# Patient Record
Sex: Male | Born: 1940 | Race: White | Hispanic: No | Marital: Married | Smoking: Former smoker
Health system: Southern US, Community
[De-identification: ages and names within clinical notes are randomized; demographics above are authoritative.]

## PROBLEM LIST (undated history)

## (undated) DIAGNOSIS — Z950 Presence of cardiac pacemaker: Secondary | ICD-10-CM

## (undated) DIAGNOSIS — M1990 Unspecified osteoarthritis, unspecified site: Secondary | ICD-10-CM

## (undated) DIAGNOSIS — R0600 Dyspnea, unspecified: Secondary | ICD-10-CM

## (undated) DIAGNOSIS — I495 Sick sinus syndrome: Secondary | ICD-10-CM

## (undated) DIAGNOSIS — J449 Chronic obstructive pulmonary disease, unspecified: Secondary | ICD-10-CM

## (undated) DIAGNOSIS — I2510 Atherosclerotic heart disease of native coronary artery without angina pectoris: Secondary | ICD-10-CM

## (undated) DIAGNOSIS — Z8719 Personal history of other diseases of the digestive system: Secondary | ICD-10-CM

## (undated) DIAGNOSIS — IMO0001 Reserved for inherently not codable concepts without codable children: Secondary | ICD-10-CM

## (undated) DIAGNOSIS — J349 Unspecified disorder of nose and nasal sinuses: Secondary | ICD-10-CM

## (undated) DIAGNOSIS — F419 Anxiety disorder, unspecified: Secondary | ICD-10-CM

## (undated) DIAGNOSIS — C61 Malignant neoplasm of prostate: Secondary | ICD-10-CM

## (undated) DIAGNOSIS — K311 Adult hypertrophic pyloric stenosis: Secondary | ICD-10-CM

## (undated) DIAGNOSIS — IMO0002 Reserved for concepts with insufficient information to code with codable children: Secondary | ICD-10-CM

## (undated) DIAGNOSIS — I10 Essential (primary) hypertension: Secondary | ICD-10-CM

## (undated) DIAGNOSIS — K219 Gastro-esophageal reflux disease without esophagitis: Secondary | ICD-10-CM

## (undated) HISTORY — PX: FRACTURE SURGERY: SHX138

## (undated) HISTORY — PX: WRIST FRACTURE SURGERY: SHX121

---

## 2013-12-27 LAB — LIPID PANEL
CHOLESTEROL: 184 mg/dL (ref 0–200)
HDL: 51 mg/dL (ref 35–70)
TRIGLYCERIDES: 78 mg/dL (ref 40–160)

## 2013-12-27 LAB — BASIC METABOLIC PANEL
BUN: 11 mg/dL (ref 4–21)
Creatinine: 0.8 mg/dL (ref 0.6–1.3)
Glucose: 100 mg/dL
Potassium: 4.2 mmol/L (ref 3.4–5.3)
Sodium: 140 mmol/L (ref 137–147)

## 2014-02-08 DIAGNOSIS — C61 Malignant neoplasm of prostate: Secondary | ICD-10-CM

## 2014-02-08 HISTORY — DX: Malignant neoplasm of prostate: C61

## 2014-02-08 HISTORY — PX: PROSTATE BIOPSY: SHX241

## 2014-02-16 ENCOUNTER — Other Ambulatory Visit (HOSPITAL_COMMUNITY): Payer: Self-pay | Admitting: Urology

## 2014-02-16 ENCOUNTER — Telehealth: Payer: Self-pay | Admitting: *Deleted

## 2014-02-16 DIAGNOSIS — C61 Malignant neoplasm of prostate: Secondary | ICD-10-CM

## 2014-02-16 NOTE — Telephone Encounter (Signed)
Called patient to confirm his referral to the 02/23/14 Prostate MDC, introduce myself, discuss clinic.  LVM, will call, again, on Monday.  Gayleen Orem, RN, BSN, [facility] Prostate Oncology Navigator [phone]

## 2014-02-19 ENCOUNTER — Telehealth: Payer: Self-pay | Admitting: *Deleted

## 2014-02-19 NOTE — Telephone Encounter (Signed)
In follow-up to my VM last Friday, called patient to introduce myself as Prostate Oncology Navigator and coordinator of the Prostate [place], to confirm his referral for the clinic on 02/23/14, location of [place], arrival time of 8:15, registration procedure, and format of clinic.  He verbalized understanding.  I provided my phone number and encouraged him to call me if he has any questions after receiving the Information Packet or prior to my call the day before clinic.  He verbalized understanding and expressed appreciation for my call.  Gayleen Orem, RN, BSN, [facility] Prostate Oncology Navigator [phone]

## 2014-02-20 ENCOUNTER — Encounter: Payer: Self-pay | Admitting: Radiation Oncology

## 2014-02-20 NOTE — Progress Notes (Signed)
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 no  Pacemaker/ICD? no  Possible current pregnancy? na  Is the patient on methotrexate? no  Current Complaints / other details:  Married, 3 children, retired

## 2014-02-22 ENCOUNTER — Ambulatory Visit (HOSPITAL_COMMUNITY)
Admission: RE | Admit: 2014-02-22 | Discharge: 2014-02-22 | Disposition: A | Discharge status: Home / Self Care | Payer: Medicare HMO | Source: Ambulatory Visit | Attending: Urology | Admitting: Urology

## 2014-02-22 ENCOUNTER — Encounter (HOSPITAL_COMMUNITY)
Admission: RE | Admit: 2014-02-22 | Discharge: 2014-02-22 | Disposition: A | Discharge status: Home / Self Care | Payer: Medicare HMO | Source: Ambulatory Visit | Attending: Urology | Admitting: Urology

## 2014-02-22 ENCOUNTER — Telehealth: Payer: Self-pay | Admitting: Oncology

## 2014-02-22 ENCOUNTER — Encounter: Payer: Self-pay | Admitting: *Deleted

## 2014-02-22 ENCOUNTER — Other Ambulatory Visit: Payer: Self-pay | Admitting: Urology

## 2014-02-22 DIAGNOSIS — R52 Pain, unspecified: Secondary | ICD-10-CM

## 2014-02-22 DIAGNOSIS — M4120 Other idiopathic scoliosis, site unspecified: Secondary | ICD-10-CM | POA: Insufficient documentation

## 2014-02-22 DIAGNOSIS — C61 Malignant neoplasm of prostate: Secondary | ICD-10-CM | POA: Insufficient documentation

## 2014-02-22 DIAGNOSIS — R948 Abnormal results of function studies of other organs and systems: Secondary | ICD-10-CM | POA: Insufficient documentation

## 2014-02-22 MED ORDER — TECHNETIUM TC 99M MEDRONATE IV KIT
26.9000 | PACK | Freq: Once | INTRAVENOUS | Status: AC | PRN
  Administered 2014-02-22: 26.9 via INTRAVENOUS

## 2014-02-22 NOTE — Telephone Encounter (Signed)
C/D 02/22/14 for appt. 02/23/14

## 2014-02-23 ENCOUNTER — Encounter: Payer: Self-pay | Admitting: Radiation Oncology

## 2014-02-23 ENCOUNTER — Encounter: Payer: Self-pay | Admitting: Specialist

## 2014-02-23 ENCOUNTER — Ambulatory Visit
Admission: RE | Admit: 2014-02-23 | Discharge: 2014-02-23 | Disposition: A | Discharge status: Home / Self Care | Payer: Medicare HMO | Source: Ambulatory Visit | Attending: Radiation Oncology | Admitting: Radiation Oncology

## 2014-02-23 ENCOUNTER — Ambulatory Visit (HOSPITAL_BASED_OUTPATIENT_CLINIC_OR_DEPARTMENT_OTHER): Payer: Commercial Managed Care - HMO | Admitting: Oncology

## 2014-02-23 ENCOUNTER — Encounter: Payer: Self-pay | Admitting: Oncology

## 2014-02-23 VITALS — BP 160/116 | HR 79 | Temp 98.0°F | Resp 20 | Ht 68.75 in | Wt 195.4 lb

## 2014-02-23 DIAGNOSIS — C61 Malignant neoplasm of prostate: Secondary | ICD-10-CM

## 2014-02-23 HISTORY — DX: Essential (primary) hypertension: I10

## 2014-02-23 HISTORY — DX: Unspecified disorder of nose and nasal sinuses: J34.9

## 2014-02-23 HISTORY — DX: Malignant neoplasm of prostate: C61

## 2014-02-23 NOTE — Consult Note (Signed)
Chief Complaint  Prostate Cancer   Reason For Visit  Reason for consult: To discuss treatment options for prostate cancer. Physician requesting consult: Dr. Rana Snare PCP: Dr. Christella Noa   History of Present Illness  Tony [name] is a 73 year old, very healthy gentleman who has a strong family history of prostate cancer with both his father and brother having been diagnosed.  He was found to have an elevated PSA of 13.9 in April 2015 by Dr. Doyle Askew and was seen by Dr. Risa Grill for this reason.  His PSA was repeated and was 15.65.  His DRE was concerning with induration noted along the entire right side of the prostate although with no clear extraprostatic extension noted. He underwent a prostate needle biopsy by Dr. 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   He underwent staging studies on 02/22/14 including a bone scan and CT scan. His bone scan demonstrated some uptake in what was initially read as the right clavicle although on independent review and conference today appears to be at the level of the right scapula.  A prior chest x-ray does demonstrate a lucent lesion in this region that correlates to the area of uptake.  Mr. Mcpartland does describe a prior fall from a ladder where he injured his right shoulder area.  Furthermore, there was mention of some mild uptake in the left ischium.  A review of the CT scan, there is no pelvic lymphadenopathy or other evidence of metastatic disease.  At the left ischium, there is a very small sclerotic focus which does not necessarily correlate to the same location on bone scan and is more likely to represent a bone island than metastatic disease.  No correlating lesion is noted to explain the bone scan findings.  No other evidence of metastatic disease on either the bone scan or CT scan is present.  An MRI of the right scapula has been recommended to further evaluate  the lesion seen there.    TNM stage: cT3a N0 Mx PSA: 15.65 Gleason score: 4+5=9 Biopsy (02/08/14): 12/12 cores positive    Left: L lateral apex (80%, 3+4=7). L apex (30%, 4+5=9), L lateral mid (20%, 3+3=6), L mid (70%, 4+4=8, PNI), L lateral base (89%, 3+3=6), L base (50%, 4+3=7, PNI)    Right: R apex (95%, 4+4=8, PNI, EPE), R lateral apex (60%, 4+5=9, PNI), R mid (90%, 4+4=8), R lateral mid (95%, 4+4=8, PNI, EPE), R base (95%, 4+5=9), R lateral base (90%, 4+5=9) Prostate volume: 54 cc  Nomogram OC disease: 3% EPE: 96% SVI: 61% LNI: 63% PFS (surgery): 8%, 4%   Urinary function: He has minimal baseline voiding symptoms.  IPSS is 1. Erectile function: SHIM score is 14.   Past Medical History  1. History of hypertension (V12.59)  Surgical History  1. Denied: History Of Prior Surgery  Allergies  1. Penicillins  Family History  1. Family history of Alzheimer's dementia : Mother, Father  2. Family history of arthritis (V17.7) : Sister  3. Family history of prostate cancer (L24.40) : Brother  Social History   Alcohol use (V49.89)   Current every day smoker (305.1)   Married   Retired  Review of Systems AU Complete-Male: Genitourinary, constitutional, skin, eye, otolaryngeal, hematologic/lymphatic, cardiovascular, pulmonary, endocrine, musculoskeletal, gastrointestinal, neurological and psychiatric system(s) were reviewed and pertinent findings if present are noted.  ENT: sinus problems.    Physical Exam Constitutional: Well nourished and well  developed . No acute distress.  ENT:. The ears and nose are normal in appearance.  Neck: The appearance of the neck is normal and no neck mass is present.  Pulmonary: No respiratory distress and normal respiratory rhythm and effort.  Cardiovascular:. No peripheral edema.  Skin: Normal skin turgor, no visible rash and no visible skin lesions.  Neuro/Psych:. Mood and affect are appropriate.    Results/Data  I have reviewed his  medical records, PSA results, pathology report, and imaging studies with findings as dictated above.  His pathology report and imaging studies were independently reviewed in the multidisciplinary conference today.     Assessment  1. Prostate cancer (185)  Discussion/Summary  1.  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 Linnemann, his wife, and his daughter today.   The patient was counseled about the natural history of prostate cancer and the standard treatment options that are available for prostate cancer. It was explained to him how his age and life expectancy, clinical stage, Gleason score, and PSA affect his prognosis, the decision to proceed with additional staging studies, as well as how that information influences recommended treatment strategies. We discussed the roles for active surveillance, radiation therapy, surgical therapy, androgen deprivation, as well as ablative therapy options for the treatment of prostate cancer as appropriate to his individual cancer situation. We discussed the risks and benefits of these options with regard to their impact on cancer control and also in terms of potential adverse events, complications, and impact on quiality of life particularly related to urinary, bowel, and sexual function. The patient was encouraged to ask questions throughout the discussion today and all questions were answered to his stated satisfaction. In addition, the patient was provided with and/or directed to appropriate resources and literature for further education about prostate cancer and treatment options.   We discussed the high risk and locally advanced nature of his disease and the need to proceed with an MRI of the scapula to absolutely rule out the risk of metastatic disease.  Assuming that he does not have metastatic disease, he understands the recommendation to proceed with aggressive therapy.  We did discuss options for therapy including  surgical resection of the prostate, radiation therapy, and systemic therapies.  Considering the high volume and high risk nature of his disease which appears to be locally advanced and taking into consideration his life expectancy and age, the consensus recommendation of the multidisciplinary group was to proceed with radiation therapy in combination with long-term (at least 24 months) androgen deprivation therapy.  We discussed the option of a clinical trial as well.  Considering his minimal symptoms, Dr. Tammi Klippel has offered the patient 5 weeks of IMRT followed by a radiation seed implant boost of the prostate.  He may need to undergo a CT arch study considering his prostate size.  I answered numerous questions about treatment options as well as the potential risks and side effects as they relate to his quality of life.  He and his family asked numerous appropriate questions and felt comfortable proceeding with the above plan.  I will plan to contact Dr. Risa Grill to inform them of Mr. Bullin's decision.  Cc: Dr. Rana Snare Dr. Tyler Pita Dr. Zola Button Dr. Christella Noa  A total of 45 minutes were spent in the overall care of the patient today with 40 minutes in direct face to face consultation.    Signatures Electronically signed by : [name], M.D.; Feb 23 2014 12:11PM  EST

## 2014-02-23 NOTE — Progress Notes (Signed)
Met with patient as part of Prostate [facility].  Reintroduced my role as his navigator and encouraged him to call as he proceeds with treatments and appointments at [facility].  Provided the accompanying Care Plan Summary:                                   Care Plan Summary  Name: [name]. Crago DOB: [birth year]  Your Medical Team:   Urologist -  Dr. [name], [facility]  Radiation Oncologist - Dr. Tyler Pita, [facility]   Medical Oncologist - Dr. Zola Button, [place]  Recommendations: 1) MRI of right scapula. 2) Start hormone therapy then begin radiation. 3) Seed implant after radiation.  * These recommendations are based on information available as of today's consult. Recommendations may change depending on the results of further tests or exams.  Next Steps: 1) [facility] will contact you to start hormone therapy. 2) [facility] will contact you to arrange MRI. 3) Dr. Johny Shears office will call to arrange an appointment for radiation planning.  When appointments need to be scheduled, you will be contacted by [facility] and/or [facility].  Questions? Please do not hesitate to call Gayleen Orem, RN, BSN, [facility] at [phone] with any questions or concerns.  Tony [name] is Counsellor and is available to assist you while you're receiving your medical care at [facility]. ______________________________________________________________________________________________________________________  I encouraged him to call me with any questions or concerns as his treatments progress.  He indicated understanding.  Gayleen Orem, RN, BSN, [facility] Prostate Oncology Navigator [phone]

## 2014-02-23 NOTE — Progress Notes (Signed)
Called patient to see if he had any questions prior to his attendance at tomorrow's Prostate [place] and to remind him of requested 8:15 arrival time.  LVM.    Gayleen Orem, RN, BSN, [facility] Prostate Oncology Navigator [phone]

## 2014-02-23 NOTE — Progress Notes (Signed)
Please see consult note.  

## 2014-02-23 NOTE — Progress Notes (Signed)
Met patient and spouse in Prostate [place]. Patient said he was a "6" on the distress screen, but his main needs were related to getting information and he felt the clinic was helping with this and reducing his anxiety. Offered support. Provided education about support center programs and service, particularly prostate support group.    Epifania Gore, PhD, [place]

## 2014-02-23 NOTE — Progress Notes (Signed)
Radiation Oncology         (336) [phone] ________________________________  Multidisciplinary Prostate Cancer Clinic  Initial Radiation Oncology Consultation  Name: Tony [name] MRN: [id number]  Date: 02/23/2014  DOB: Jan 29, 1941  CC:No primary provider on file.  Tony Bring, MD   REFERRING PHYSICIAN: [name], MD  DIAGNOSIS: 73 y.o. gentleman with stage T2b (DRE) vs. T3a (core biopsies) adenocarcinoma of the prostate with a Gleason's score of 4+5 and a PSA of 15.65  HISTORY OF PRESENT ILLNESS::Tony [name] is a 73 y.o. gentleman who has a strong family history of prostate cancer with both his father and brother having been diagnosed. He was found to have an elevated PSA of 13.9 in April 2015 by Dr. Doyle [name] and was seen by Dr. Risa [name] for this reason. His PSA was repeated and was 15.65. His DRE was concerning with induration noted along the entire right side of the prostate although with no clear extraprostatic extension noted. He underwent a prostate needle biopsy by Dr. Risa [name] on 02/08/14 which demonstrated Gleason 4+5=9 adenocarcinoma of the prostate with 12 out of 12 biopsy cores positive for malignancy.     He underwent staging studies on 02/22/14 including a bone scan and CT scan. His bone scan demonstrated some uptake in what was initially read as the right clavicle although on independent review and conference today appears to be at the level of the right scapula.     A shoulder x-ray does demonstrate a lucent lesion in this region that correlates to the area of uptake.     Tony [name] does describe a prior fall from a ladder where he injured his right shoulder area. Furthermore, there was mention of some mild uptake in the left ischium. A review of the CT scan, there is no pelvic lymphadenopathy or other evidence of metastatic disease. At the left ischium, there is a very small sclerotic focus which does not necessarily correlate to the same location on bone scan and  is more likely to represent a bone island than metastatic disease. No correlating lesion is noted to explain the bone scan findings. No other evidence of metastatic disease on either the bone scan or CT scan is present. An MRI of the right scapula has been recommended to further evaluate the lesion seen there  The patient reviewed the biopsy results with his urologist and he has kindly been referred today to the multidisciplinary prostate cancer clinic for presentation of pathology and radiology studies in our conference for discussion of potential radiation treatment options and clinical evaluation.  PREVIOUS RADIATION THERAPY: No  PAST MEDICAL HISTORY:  has a past medical history of Prostate cancer (02/08/14); Hypertension; and Sinus problem.    PAST SURGICAL HISTORY: Past Surgical History  Procedure Laterality Date  . Prostate biopsy  02/08/14    gleason 4+5=9, 12/12 cores positive, 54 gm    FAMILY HISTORY: family history includes Alzheimer's disease in his father and mother; Arthritis in his sister; Cancer in his brother and father.  SOCIAL HISTORY:  reports that he has been smoking.  He does not have any smokeless tobacco history on file. He reports that he drinks alcohol. He reports that he does not use illicit drugs.  ALLERGIES: Penicillins  MEDICATIONS:  Current Outpatient Prescriptions  Medication Sig Dispense Refill  . fexofenadine-pseudoephedrine (ALLEGRA-D 24) 180-240 MG per 24 hr tablet        No current facility-administered medications for this encounter.    REVIEW OF SYSTEMS:  A 15 point review  of systems is documented in the electronic medical record. This was obtained by the nursing staff. However, I reviewed this with the patient to discuss relevant findings and make appropriate changes.  A comprehensive review of systems was negative..  The patient completed an IPSS and IIEF questionnaire.     PHYSICAL EXAM: This patient is in no acute distress.  He is alert and  oriented.   height is 5' 8.75" (1.746 m) and weight is 195 lb 6.4 oz (88.633 kg). His oral temperature is 98 F (36.7 C). His blood pressure is 160/116 and his pulse is 79. His respiration is 20.  He exhibits no respiratory distress or labored breathing.  He appears neurologically intact.  His mood is pleasant.  His affect is appropriate.  Please note the digital rectal exam findings described above.  KPS = 100  100 - Normal; no complaints; no evidence of disease. 90   - Able to carry on normal activity; minor signs or symptoms of disease. 80   - Normal activity with effort; some signs or symptoms of disease. 74   - Cares for self; unable to carry on normal activity or to do active work. 60   - Requires occasional assistance, but is able to care for most of his personal needs. 50   - Requires considerable assistance and frequent medical care. 64   - Disabled; requires special care and assistance. 46   - Severely disabled; hospital admission is indicated although death not imminent. 33   - Very sick; hospital admission necessary; active supportive treatment necessary. 10   - Moribund; fatal processes progressing rapidly. 0     - Dead  Karnofsky DA, Abelmann WH, Craver LS and Burchenal JH [phone]) The use of the nitrogen mustards in the palliative treatment of carcinoma: with particular reference to bronchogenic carcinoma Cancer 1 634-56   LABORATORY DATA:  No results found for this basename: WBC, HGB, HCT, MCV, PLT   No results found for this basename: NA, K, CL, CO2   No results found for this basename: ALT, AST, GGT, ALKPHOS, BILITOT     RADIOGRAPHY: Dg Clavicle Right  02/22/2014   CLINICAL DATA:  Prostate cancer, abnormal bone scan at RIGHT clavicular region  EXAM: RIGHT CLAVICLE - 2+ VIEWS  COMPARISON:  Radionuclide bone scan 02/22/2014  FINDINGS: Osseous mineralization grossly normal.  Joint space narrowing and spur formation at RIGHT [facility] joint compatible with degenerative changes.  AC  joint alignment normal.  No clavicular fracture or bone destruction.  Question subtle lucent lesion at the superior medial aspect of the RIGHT scapula.  Based on position of the scapula this could account for the scintigraphic finding.  IMPRESSION: No abnormal like clavicular focus is identified radiographically to correspond to the observed bone scan finding.  However questionable lucent lesion is seen at the superior posterior medial aspect of the scapula, potentially could account for the scintigraphic finding.  Lucent lesions however are less typical of metastatic prostate cancer and this lesion is of uncertain significance.  Consider followup MR imaging of the RIGHT scapula and clavicle to assess.   Electronically Signed   By: Lavonia Dana M.D.   On: 02/22/2014 16:38   Nm Bone Scan Whole Body  02/22/2014   ADDENDUM REPORT: 02/22/2014 16:41  ADDENDUM: Are further review, there is a subtle area of increased uptake in the left lateral ischium. Advise radiographs of the pelvis with particular attention to the left ischium to further evaluate.   Electronically Signed  By: Lowella Grip M.D.   On: 02/22/2014 16:41   02/22/2014   CLINICAL DATA:  Prostate carcinoma  EXAM: NUCLEAR MEDICINE WHOLE BODY BONE SCAN  Views:  Whole-body  Radionuclide: Technetium 74m methylene diphosphonate  Dose:  26.9 mCi  Route of administration: Intravenous  COMPARISON:  Right clavicle radiographs February 22, 2014  FINDINGS: There is focal increased uptake in the mid right clavicle, a finding felt to have traumatic etiology. There is modest uptake in the mid lumbar region in an area of levoscoliosis. This modest uptake in this area is felt to have arthropathic etiology. Elsewhere, the distribution of uptake is within normal limits. Flank kidneys are noted bilaterally.  IMPRESSION: No findings indicative of bony metastatic disease. Increased uptake in the mid right clavicle is felt to have traumatic etiology. There is mild arthropathic  change in the mid lumbar region, exacerbated by scoliosis.  Electronically Signed: By: Lowella Grip M.D. On: 02/22/2014 14:58      IMPRESSION: This gentleman is a 73 y.o. gentleman with stage T2b (DRE) vs. T3a (core biopsies) adenocarcinoma of the prostate with a Gleason's score of 4+5 and a PSA of 15.65.  He had a high volume of disease with 12 out of 12 biopsies positive.  At the least, he has high risk localized prostate cancer, but, he may also have a metastatic deposit to the right scapula pending MRI and biopsy.  If his additional work-up rules out metastatic disease, he is eligible for a variety of potential treatment options including 2-3 years of androgen deprivation combined with external radiation and seed implant boost..  PLAN:Today I reviewed the findings and workup thus far.  He understands the need for scapula MRI and further evaluation with biopsy if MRI is suspicious.  If the MRI is negative, we talked about prostate radiotherapy.  We discussed the natural history of prostate cancer.  We reviewed the the implications of T-stage, Gleason's Score, and PSA on decision-making and outcomes in prostate cancer.  We discussed radiation treatment in the management of prostate cancer with regard to the logistics and delivery of external beam radiation treatment as well as the logistics and delivery of prostate brachytherapy.  We compared and contrasted each of these approaches and also compared these against prostatectomy.  The patient expressed interest in external beam radiotherapy followed by seed implant.  I filled out a patient counseling form for him with relevant treatment diagrams and we retained a copy for our records.   If further testing rules out metastatic disease, the patient would like to proceed with 2-3 years of androgen deprivation combined with external radiation and seed implant boost. If his scapular lesion represents a solitary isolated bone metastasis, we could also  consider 2-3 years of androgen deprivation combined with external radiation and seed implant boost, using SBRT to ablate the scapular metastasis.  Dr. Alinda Money will move forward with scheduling androgen deprivation, shoulder MRI, possilbe scapula biopsy, placement of three gold fiducial markers into the prostate to proceed with external radiation and seed implant boost.  I enjoyed meeting with him today, and will look forward to participating in the care of this very nice gentleman.   I spent 40 minutes face to face with the patient and more than 50% of that time was spent in counseling and/or coordination of care.   ------------------------------------------------  Sheral Apley. Tammi Klippel, M.D.

## 2014-02-23 NOTE — Consult Note (Signed)
Reason for Referral: Prostate cancer.   HPI: 73 year old gentleman of [place] where he lived the majority of his life. He is a gentleman without any documented past medical history referred to Dr. Risa Grill for an elevated PSA of 15.7. He underwent a rectal digital examination and found to have an induration of the right lower the prostate. 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 The Gleason score was 4+5 equals 9 is a predominant pattern with a right sided extra prostatic extension. As mentioned all 12 cores had cancer. He also underwent staging workup with a bone scan which was done on 02/22/2014 which showed no evidence of any bulky disease but did show 2 questionable areas of concern. The right clavicle lesion was detected and plain film x-rays were also obtained on 02/22/2014 showed possible lesion there but not sclerotic. There is also a subtle area of the left lateral ischium could also be concerning. CT scan of the abdomen and pelvis did not show clear-cut metastatic disease in that area or any other lymphadenopathy. Recent referred to the prostate cancer multidisciplinary clinic for a discussion and evaluation.  Clinically, he is relatively asymptomatic. He does report very little nocturia and occasional hesitancy. There is no incontinence but occasional urgency. He does not report any hematuria or dysuria. He is a rather functional gentleman he continues to attend [employer]. He lives with his wife and multiple grandchildren. He does not report any headaches blurred vision or double vision. Does not report any alteration of mental status psychiatric issues or depression. Has not reported any syncope or seizure. He does not report any chest pain shortness of breath cough or hemoptysis. Does not report any palpitation orthopnea or PND. Have not had any cardiac issues or congestive heart  failure per his report. He does not report any nausea, vomiting, abdominal pain, or satiety or change in his bowel habits. He does not report any musca skeletal complaints. Is not reporting any bony pain joint pain or joint stiffness. He does not report any skin rashes or lymphadenopathy. Rest of his review of systems unremarkable.   Past Medical History  Diagnosis Date  . 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 Hypertension   . Sinus problem   :  Past Surgical History  Procedure Laterality Date  . Prostate biopsy  02/08/14    gleason 4+5=9, 12/12 cores positive, 54 gm  :  Current outpatient prescriptions:fexofenadine-pseudoephedrine (ALLEGRA-D 24) 180-240 MG per 24 hr tablet, , Disp: , Rfl: :  Allergies  Allergen Reactions  . Penicillins Rash  :  Family History  Problem Relation Age of Onset  . Alzheimer's disease Mother   . Alzheimer's disease Father   . Cancer Father     prostate  . Arthritis Sister   . Cancer Brother     prostate  :  History   Social History  . Marital Status: Married    Spouse Name: N/A    Number of Children: N/A  . Years of Education: N/A   Occupational History  . Not on file.   Social History Main Topics  . Smoking status: Current Every Day Smoker  . Smokeless tobacco: Not on file  . Alcohol Use: Yes     Comment: 1 beer daily  . Drug Use: No  . Sexual Activity: Not on file   Other Topics Concern  . Not on file  Social History Narrative  . No narrative on file  :  Pertinent items are noted in HPI.  Exam: ECOG 1 There were no vitals taken for this visit. General appearance: alert and cooperative Head: Normocephalic, without obvious abnormality Throat: lips, mucosa, and tongue normal; teeth and gums normal Neck: no adenopathy Back: symmetric, no curvature. ROM normal. No CVA tenderness. Resp: clear to auscultation bilaterally Chest wall: no tenderness Cardio: regular rate and rhythm, S1, S2 normal, no murmur,  click, rub or gallop GI: soft, non-tender; bowel sounds normal; no masses,  no organomegaly Extremities: extremities normal, atraumatic, no cyanosis or edema Pulses: 2+ and symmetric Skin: Skin color, texture, turgor normal. No rashes or lesions Lymph nodes: Cervical, supraclavicular, and axillary nodes normal.     Dg Clavicle Right  02/22/2014   CLINICAL DATA:  Prostate cancer, abnormal bone scan at RIGHT clavicular region  EXAM: RIGHT CLAVICLE - 2+ VIEWS  COMPARISON:  Radionuclide bone scan 02/22/2014  FINDINGS: Osseous mineralization grossly normal.  Joint space narrowing and spur formation at RIGHT [facility] joint compatible with degenerative changes.  AC joint alignment normal.  No clavicular fracture or bone destruction.  Question subtle lucent lesion at the superior medial aspect of the RIGHT scapula.  Based on position of the scapula this could account for the scintigraphic finding.  IMPRESSION: No abnormal like clavicular focus is identified radiographically to correspond to the observed bone scan finding.  However questionable lucent lesion is seen at the superior posterior medial aspect of the scapula, potentially could account for the scintigraphic finding.  Lucent lesions however are less typical of metastatic prostate cancer and this lesion is of uncertain significance.  Consider followup MR imaging of the RIGHT scapula and clavicle to assess.   Electronically Signed   By: Lavonia Dana M.D.   On: 02/22/2014 16:38   Nm Bone Scan Whole Body  02/22/2014   ADDENDUM REPORT: 02/22/2014 16:41  ADDENDUM: Are further review, there is a subtle area of increased uptake in the left lateral ischium. Advise radiographs of the pelvis with particular attention to the left ischium to further evaluate.   Electronically Signed   By: Lowella Grip M.D.   On: 02/22/2014 16:41   02/22/2014   CLINICAL DATA:  Prostate carcinoma  EXAM: NUCLEAR MEDICINE WHOLE BODY BONE SCAN  Views:  Whole-body  Radionuclide:  Technetium 49m methylene diphosphonate  Dose:  26.9 mCi  Route of administration: Intravenous  COMPARISON:  Right clavicle radiographs February 22, 2014  FINDINGS: There is focal increased uptake in the mid right clavicle, a finding felt to have traumatic etiology. There is modest uptake in the mid lumbar region in an area of levoscoliosis. This modest uptake in this area is felt to have arthropathic etiology. Elsewhere, the distribution of uptake is within normal limits. Flank kidneys are noted bilaterally.  IMPRESSION: No findings indicative of bony metastatic disease. Increased uptake in the mid right clavicle is felt to have traumatic etiology. There is mild arthropathic change in the mid lumbar region, exacerbated by scoliosis.  Electronically Signed: By: Lowella Grip M.D. On: 02/22/2014 14:58    Assessment and Plan:    73 year old gentleman diagnosed with prostate cancer in May of 2015. He presented with a PSA of 15.7 and a biopsy confirmed the presence of a high volume prostate cancer. His disease involves all 12 cores of the biopsy with a predominant pattern 4+5 equals 9. He appears to have extra prostatic extension for the biopsy results. The pathology was reviewed today  with the reviewing pathologists. His staging workup including a bone scan and a CT scan was reviewed today with radiology and showed a suspicious area in the scapula that is not characteristic of prostate cancer. His case was also discussed as a part of the prostate cancer multidisciplinary clinic.  The natural course of this disease was discussed with the patient and his family that accompanied him today. I explained to them that he he represents high-risk prostate cancer was very likely microscopic metastatic disease. The first step is to document whether he has scapular metastasis or not. Radiology have recommended an MRI of that area for better identification. Treatment options were reviewed today with the patient and his  family. I do not think surgery is a great option for this gentleman. Given his age and possible and diagnosed comorbid conditions, as well as the nature of his advanced disease makes radical prostatectomy likely unnecessary procedure. Alternatively, radiation therapy with androgen depravation might be his best option. Certainly that would be the treatment of choice should he have no evidence of metastasis at this time. I discussed with him the logistics of administration of androgen deprivation for the next 24 months which includes hot flashes, muscle loss, bone density loss, cognitive decline, and possible cardiovascular complications. I discussed with him also the role of systemic chemotherapy in the form of Taxotere. I've explained to him that this medication is approved in advanced disease and there are early indications from ASCO 2015 that is bringing systemic chemotherapy in locally advanced disease positive results. I don't think it's ready for clinical implementation and we will defer chemotherapy should he develops advanced disease.  If he does develop hormone sensitive prostate cancer with advanced disease based on the newer evidence including the CHAARTED as well as the STAMPEAD trial indicating the utility of early systemic chemotherapy.  All his questions were answered today and I advocate starting hormone therapy and in the future in anticipation of possible radiation therapy after that.

## 2014-02-27 ENCOUNTER — Other Ambulatory Visit (HOSPITAL_COMMUNITY): Payer: Self-pay | Admitting: Urology

## 2014-02-27 DIAGNOSIS — C61 Malignant neoplasm of prostate: Secondary | ICD-10-CM

## 2014-02-28 ENCOUNTER — Telehealth: Payer: Self-pay | Admitting: *Deleted

## 2014-02-28 NOTE — Telephone Encounter (Signed)
Patient's wife called asking for appt clarification.  I explained that they will receive a call from [facility]) regarding the scheduling of CT Sim with Dr. Tammi Klippel after the gold markers are implanted by Dr. Risa Grill [facility]).  She verbalized understanding.  Gayleen Orem, RN, BSN, [facility] Prostate Oncology Navigator [phone]

## 2014-03-01 ENCOUNTER — Other Ambulatory Visit: Payer: Self-pay | Admitting: Radiation Oncology

## 2014-03-06 ENCOUNTER — Ambulatory Visit (HOSPITAL_COMMUNITY)
Admission: RE | Admit: 2014-03-06 | Discharge: 2014-03-06 | Disposition: A | Discharge status: Home / Self Care | Payer: Medicare HMO | Source: Ambulatory Visit | Attending: Urology | Admitting: Urology

## 2014-03-06 DIAGNOSIS — R937 Abnormal findings on diagnostic imaging of other parts of musculoskeletal system: Secondary | ICD-10-CM | POA: Insufficient documentation

## 2014-03-06 DIAGNOSIS — Z8546 Personal history of malignant neoplasm of prostate: Secondary | ICD-10-CM | POA: Insufficient documentation

## 2014-03-06 DIAGNOSIS — C61 Malignant neoplasm of prostate: Secondary | ICD-10-CM

## 2014-03-06 MED ORDER — GADOBENATE DIMEGLUMINE 529 MG/ML IV SOLN
20.0000 mL | Freq: Once | INTRAVENOUS | Status: AC | PRN
  Administered 2014-03-06: 19 mL via INTRAVENOUS

## 2014-04-19 ENCOUNTER — Telehealth: Payer: Self-pay | Admitting: *Deleted

## 2014-04-19 NOTE — Telephone Encounter (Signed)
CALLED PATIENT TO REMIND OF APPT. FOR 04-20-14, SPOKE WITH PATIENT'S [name] AND SHE IS AWARE OF THIS APPT.

## 2014-04-20 ENCOUNTER — Encounter: Payer: Self-pay | Admitting: Radiation Oncology

## 2014-04-20 ENCOUNTER — Ambulatory Visit
Admission: RE | Admit: 2014-04-20 | Discharge: 2014-04-20 | Disposition: A | Discharge status: Home / Self Care | Payer: Medicare HMO | Source: Ambulatory Visit | Attending: Radiation Oncology | Admitting: Radiation Oncology

## 2014-04-20 DIAGNOSIS — Z510 Encounter for antineoplastic radiation therapy: Secondary | ICD-10-CM | POA: Diagnosis present

## 2014-04-20 DIAGNOSIS — C61 Malignant neoplasm of prostate: Secondary | ICD-10-CM | POA: Diagnosis not present

## 2014-04-20 NOTE — Progress Notes (Signed)
  Radiation Oncology         [phone]) [phone] ________________________________  Name: Tony [name] MRN: [id number]  Date: 04/20/2014  DOB: Mar 22, 1941  SIMULATION AND TREATMENT PLANNING NOTE  DIAGNOSIS:  73 y.o. 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  Identity was confirmed.  All relevant records and images related to the planned course of therapy were reviewed.  The patient freely provided informed written consent to proceed with treatment after reviewing the details related to the planned course of therapy. The consent form was witnessed and verified by the simulation staff.  Then, the patient was set-up in a stable reproducible supine position for radiation therapy.  A vacuum lock pillow device was custom fabricated to position his legs in a reproducible immobilized position.  Then, I performed a urethrogram under sterile conditions to identify the prostatic apex.  CT images were obtained.  Surface markings were placed.  The CT images were loaded into the planning software.  Then the prostate target and avoidance structures including the rectum, bladder, bowel and hips were contoured.  Treatment planning then occurred.  The radiation prescription was entered and confirmed.  I designed and supervised the construction of a total of 5 complex treatment devices including BodyFix leg mold and 4 multi-leaf collimator apertures. I have requested : 3D Simulation  I have requested a DVH of the following structures: rectum, bladder, and prostate.  PUBIC ARCH STUDY:  The patient had evaluation for possible prostate seed implant boost. Using three-dimensional radiation planning tools I reconstructed the prostate in view of the structures from the transperineal needle pathway to assess for possible pubic arch interference. In doing so, I did not  appreciate any pubic arch interference. Also, the patient's prostate volume was estimated based on the drawn structure. The volume was 36 cc.  Given the pubic arch appearance and prostate volume, patient remains a good candidate to proceed with prostate seed implant boost    PLAN:  The patient will receive 45 Gy in 25 fractions followed by seed implant boost.  ________________________________  Sheral Apley Tammi Klippel, M.D.

## 2014-04-24 ENCOUNTER — Other Ambulatory Visit: Payer: Self-pay | Admitting: Urology

## 2014-04-25 DIAGNOSIS — Z510 Encounter for antineoplastic radiation therapy: Secondary | ICD-10-CM | POA: Diagnosis not present

## 2014-04-27 ENCOUNTER — Ambulatory Visit
Admission: RE | Admit: 2014-04-27 | Discharge: 2014-04-27 | Disposition: A | Discharge status: Home / Self Care | Payer: Medicare HMO | Source: Ambulatory Visit | Attending: Radiation Oncology | Admitting: Radiation Oncology

## 2014-04-27 DIAGNOSIS — Z510 Encounter for antineoplastic radiation therapy: Secondary | ICD-10-CM | POA: Diagnosis not present

## 2014-04-29 DIAGNOSIS — Z510 Encounter for antineoplastic radiation therapy: Secondary | ICD-10-CM | POA: Diagnosis not present

## 2014-04-30 ENCOUNTER — Ambulatory Visit
Admission: RE | Admit: 2014-04-30 | Discharge: 2014-04-30 | Disposition: A | Discharge status: Home / Self Care | Payer: Medicare HMO | Source: Ambulatory Visit | Attending: Radiation Oncology | Admitting: Radiation Oncology

## 2014-04-30 DIAGNOSIS — Z510 Encounter for antineoplastic radiation therapy: Secondary | ICD-10-CM | POA: Diagnosis not present

## 2014-05-01 ENCOUNTER — Ambulatory Visit
Admission: RE | Admit: 2014-05-01 | Discharge: 2014-05-01 | Disposition: A | Discharge status: Home / Self Care | Payer: Medicare HMO | Source: Ambulatory Visit | Attending: Radiation Oncology | Admitting: Radiation Oncology

## 2014-05-01 DIAGNOSIS — Z510 Encounter for antineoplastic radiation therapy: Secondary | ICD-10-CM | POA: Diagnosis not present

## 2014-05-02 ENCOUNTER — Ambulatory Visit
Admission: RE | Admit: 2014-05-02 | Discharge: 2014-05-02 | Disposition: A | Discharge status: Home / Self Care | Payer: Medicare HMO | Source: Ambulatory Visit | Attending: Radiation Oncology | Admitting: Radiation Oncology

## 2014-05-02 DIAGNOSIS — Z510 Encounter for antineoplastic radiation therapy: Secondary | ICD-10-CM | POA: Diagnosis not present

## 2014-05-03 ENCOUNTER — Ambulatory Visit
Admission: RE | Admit: 2014-05-03 | Discharge: 2014-05-03 | Disposition: A | Discharge status: Home / Self Care | Payer: Medicare HMO | Source: Ambulatory Visit | Attending: Radiation Oncology | Admitting: Radiation Oncology

## 2014-05-03 DIAGNOSIS — Z510 Encounter for antineoplastic radiation therapy: Secondary | ICD-10-CM | POA: Diagnosis not present

## 2014-05-04 ENCOUNTER — Encounter: Payer: Self-pay | Admitting: Radiation Oncology

## 2014-05-04 ENCOUNTER — Ambulatory Visit
Admission: RE | Admit: 2014-05-04 | Discharge: 2014-05-04 | Disposition: A | Discharge status: Home / Self Care | Payer: Medicare HMO | Source: Ambulatory Visit | Attending: Radiation Oncology | Admitting: Radiation Oncology

## 2014-05-04 VITALS — BP 170/107 | HR 76 | Temp 97.4°F | Resp 14 | Wt 199.4 lb

## 2014-05-04 DIAGNOSIS — Z510 Encounter for antineoplastic radiation therapy: Secondary | ICD-10-CM | POA: Diagnosis not present

## 2014-05-04 DIAGNOSIS — C61 Malignant neoplasm of prostate: Secondary | ICD-10-CM

## 2014-05-04 NOTE — Progress Notes (Addendum)
Completed post sim education with patient today. Oriented patient to staff and routine of the clinic. Provided patient with RADIATION THERAPY AND YOU handbook then, reviewed pertinent information. Educated patient reference potential side effects and management such, as fatigue, skin changes and urinary/bladder changes. Allow patient the opportunity to ask questions. Patient verbalized understanding of all reviewed. Denies dysuria or hematuria. Reports having a strong urine stream. Reports nocturia x2. Denies urgency, frequency, or incontinence. Denies diarrhea or fatigue. Denies pain. Blood pressure elevated. Denies headache or dizziness. Weight stable.

## 2014-05-06 ENCOUNTER — Encounter: Payer: Self-pay | Admitting: Radiation Oncology

## 2014-05-06 NOTE — Progress Notes (Signed)
  Radiation Oncology         [phone]) [phone] ________________________________  Name: Tony [name] MRN: [id number]  Date: 05/04/2014  DOB: [birth year]  Weekly Radiation Therapy Management  Current Dose: 9 Gy     Planned Dose:  75 Gy  Narrative . . . . . . . . The patient presents for routine under treatment assessment.                                   The patient is without complaint.                                 Set-up films were reviewed.                                 The chart was checked. Physical Findings. . .  weight is 199 lb 6.4 oz (90.447 kg). His oral temperature is 97.4 F (36.3 C). His blood pressure is 170/107 and his pulse is 76. His respiration is 14 and oxygen saturation is 97%. . Weight essentially stable.  No significant changes. Impression . . . . . . . The patient is tolerating radiation. Plan . . . . . . . . . . . . Continue treatment as planned.  ________________________________  Sheral Apley. Tammi Klippel, M.D.

## 2014-05-07 ENCOUNTER — Ambulatory Visit
Admission: RE | Admit: 2014-05-07 | Discharge: 2014-05-07 | Disposition: A | Discharge status: Home / Self Care | Payer: Medicare HMO | Source: Ambulatory Visit | Attending: Radiation Oncology | Admitting: Radiation Oncology

## 2014-05-07 DIAGNOSIS — Z510 Encounter for antineoplastic radiation therapy: Secondary | ICD-10-CM | POA: Diagnosis not present

## 2014-05-08 ENCOUNTER — Ambulatory Visit
Admission: RE | Admit: 2014-05-08 | Discharge: 2014-05-08 | Disposition: A | Discharge status: Home / Self Care | Payer: Medicare HMO | Source: Ambulatory Visit | Attending: Radiation Oncology | Admitting: Radiation Oncology

## 2014-05-08 DIAGNOSIS — Z510 Encounter for antineoplastic radiation therapy: Secondary | ICD-10-CM | POA: Diagnosis not present

## 2014-05-09 ENCOUNTER — Ambulatory Visit
Admission: RE | Admit: 2014-05-09 | Discharge: 2014-05-09 | Disposition: A | Discharge status: Home / Self Care | Payer: Medicare HMO | Source: Ambulatory Visit | Attending: Radiation Oncology | Admitting: Radiation Oncology

## 2014-05-09 DIAGNOSIS — Z510 Encounter for antineoplastic radiation therapy: Secondary | ICD-10-CM | POA: Diagnosis not present

## 2014-05-10 ENCOUNTER — Ambulatory Visit
Admission: RE | Admit: 2014-05-10 | Discharge: 2014-05-10 | Disposition: A | Discharge status: Home / Self Care | Payer: Medicare HMO | Source: Ambulatory Visit | Attending: Radiation Oncology | Admitting: Radiation Oncology

## 2014-05-10 DIAGNOSIS — Z510 Encounter for antineoplastic radiation therapy: Secondary | ICD-10-CM | POA: Diagnosis not present

## 2014-05-11 ENCOUNTER — Ambulatory Visit
Admission: RE | Admit: 2014-05-11 | Discharge: 2014-05-11 | Disposition: A | Discharge status: Home / Self Care | Payer: Medicare HMO | Source: Ambulatory Visit | Attending: Radiation Oncology | Admitting: Radiation Oncology

## 2014-05-11 ENCOUNTER — Encounter: Payer: Self-pay | Admitting: Radiation Oncology

## 2014-05-11 VITALS — BP 164/110 | HR 79 | Resp 16 | Wt 199.3 lb

## 2014-05-11 DIAGNOSIS — C61 Malignant neoplasm of prostate: Secondary | ICD-10-CM

## 2014-05-11 DIAGNOSIS — Z510 Encounter for antineoplastic radiation therapy: Secondary | ICD-10-CM | POA: Diagnosis not present

## 2014-05-11 NOTE — Progress Notes (Signed)
Denies pain. States, "I feel great." Denies hematuria or dysuria. Denies diarrhea, blood in stool or pain associated with bowel movements. Denies urgency or frequency. Reports nocturia x2. Denies fatigue.

## 2014-05-11 NOTE — Progress Notes (Signed)
  Radiation Oncology         [phone]) [phone] ________________________________  Name: Tony [name]. Seider MRN: [id number]  Date: 05/11/2014  DOB: 01-15-1941  Weekly Radiation Therapy Management  Current Dose: 18 Gy     Planned Dose:  45 Gy plus seed boost  Narrative . . . . . . . . The patient presents for routine under treatment assessment.                                   The patient is without complaint.                                 Set-up films were reviewed.                                 The chart was checked. Physical Findings. . .  weight is 199 lb 4.8 oz (90.402 kg). His blood pressure is 164/110 and his pulse is 79. His respiration is 16. . Weight essentially stable.  No significant changes. Impression . . . . . . . The patient is tolerating radiation. Plan . . . . . . . . . . . . Continue treatment as planned.  ________________________________  Sheral Apley. Tammi Klippel, M.D.

## 2014-05-14 ENCOUNTER — Ambulatory Visit
Admission: RE | Admit: 2014-05-14 | Discharge: 2014-05-14 | Disposition: A | Discharge status: Home / Self Care | Payer: Medicare HMO | Source: Ambulatory Visit | Attending: Radiation Oncology | Admitting: Radiation Oncology

## 2014-05-14 DIAGNOSIS — Z510 Encounter for antineoplastic radiation therapy: Secondary | ICD-10-CM | POA: Diagnosis not present

## 2014-05-15 ENCOUNTER — Ambulatory Visit
Admission: RE | Admit: 2014-05-15 | Discharge: 2014-05-15 | Disposition: A | Discharge status: Home / Self Care | Payer: Medicare HMO | Source: Ambulatory Visit | Attending: Radiation Oncology | Admitting: Radiation Oncology

## 2014-05-15 DIAGNOSIS — Z510 Encounter for antineoplastic radiation therapy: Secondary | ICD-10-CM | POA: Diagnosis not present

## 2014-05-15 NOTE — Addendum Note (Signed)
Encounter addended by: Heywood Footman, RN on: 05/15/2014 11:46 AM<BR>     Documentation filed: Notes Section, Inpatient Document Flowsheet

## 2014-05-16 ENCOUNTER — Ambulatory Visit
Admission: RE | Admit: 2014-05-16 | Discharge: 2014-05-16 | Disposition: A | Discharge status: Home / Self Care | Payer: Medicare HMO | Source: Ambulatory Visit | Attending: Radiation Oncology | Admitting: Radiation Oncology

## 2014-05-16 DIAGNOSIS — Z510 Encounter for antineoplastic radiation therapy: Secondary | ICD-10-CM | POA: Diagnosis not present

## 2014-05-17 ENCOUNTER — Ambulatory Visit
Admission: RE | Admit: 2014-05-17 | Discharge: 2014-05-17 | Disposition: A | Discharge status: Home / Self Care | Payer: Medicare HMO | Source: Ambulatory Visit | Attending: Radiation Oncology | Admitting: Radiation Oncology

## 2014-05-17 DIAGNOSIS — Z510 Encounter for antineoplastic radiation therapy: Secondary | ICD-10-CM | POA: Diagnosis not present

## 2014-05-18 ENCOUNTER — Telehealth: Payer: Self-pay | Admitting: *Deleted

## 2014-05-18 ENCOUNTER — Ambulatory Visit
Admission: RE | Admit: 2014-05-18 | Discharge: 2014-05-18 | Disposition: A | Discharge status: Home / Self Care | Payer: Medicare HMO | Source: Ambulatory Visit | Attending: Radiation Oncology | Admitting: Radiation Oncology

## 2014-05-18 ENCOUNTER — Encounter: Payer: Self-pay | Admitting: Radiation Oncology

## 2014-05-18 VITALS — BP 150/97 | HR 78 | Resp 16 | Wt 200.6 lb

## 2014-05-18 DIAGNOSIS — C61 Malignant neoplasm of prostate: Secondary | ICD-10-CM

## 2014-05-18 DIAGNOSIS — Z510 Encounter for antineoplastic radiation therapy: Secondary | ICD-10-CM | POA: Diagnosis not present

## 2014-05-18 NOTE — Telephone Encounter (Signed)
CALLED PATIENT TO ASK ABOUT GETTING CHEST X-RAY AND EKG, LVM FOR A RETURN CALL

## 2014-05-18 NOTE — Progress Notes (Signed)
  Radiation Oncology         [phone]) [phone] ________________________________  Name: Tony [name]. Ficken MRN: [id number]  Date: 05/18/2014  DOB: Jan 13, 1941  Weekly Radiation Therapy Management  Current Dose: 27 Gy     Planned Dose:  155 Gy  Narrative . . . . . . . . The patient presents for routine under treatment assessment.                                   Weight and vitals stable. Denies dysuria or hematuria. Reports nocturia x2. Reports a strong steady urine stream when voiding. Denies difficulty emptying his bladder. Denies diarrhea. Questions is he may get a flu shot? The patient is without complaint.                                 Set-up films were reviewed.                                 The chart was checked. Physical Findings. . .  weight is 200 lb 9.6 oz (90.992 kg). His blood pressure is 150/97 and his pulse is 78. His respiration is 16 and oxygen saturation is 96%. . Weight essentially stable.  No significant changes. Impression . . . . . . . The patient is tolerating radiation. Plan . . . . . . . . . . . . Continue treatment as planned.  Approved that he get the flu shot from his PCP.  ________________________________  Sheral Apley. Tammi Klippel, M.D.

## 2014-05-18 NOTE — Progress Notes (Signed)
Weight and vitals stable. Denies dysuria or hematuria. Reports nocturia x2. Reports a strong steady urine stream when voiding. Denies difficulty emptying his bladder. Denies diarrhea. Questions is he may get a flu shot?

## 2014-05-22 ENCOUNTER — Ambulatory Visit
Admission: RE | Admit: 2014-05-22 | Discharge: 2014-05-22 | Disposition: A | Discharge status: Home / Self Care | Payer: Medicare HMO | Source: Ambulatory Visit | Attending: Radiation Oncology | Admitting: Radiation Oncology

## 2014-05-22 ENCOUNTER — Ambulatory Visit (HOSPITAL_BASED_OUTPATIENT_CLINIC_OR_DEPARTMENT_OTHER)
Admission: RE | Admit: 2014-05-22 | Discharge: 2014-05-22 | Disposition: A | Discharge status: Home / Self Care | Payer: Medicare HMO | Source: Ambulatory Visit | Attending: Urology | Admitting: Urology

## 2014-05-22 ENCOUNTER — Encounter (HOSPITAL_BASED_OUTPATIENT_CLINIC_OR_DEPARTMENT_OTHER)
Admission: RE | Admit: 2014-05-22 | Discharge: 2014-05-22 | Disposition: A | Discharge status: Home / Self Care | Payer: Medicare HMO | Source: Ambulatory Visit | Attending: Urology | Admitting: Urology

## 2014-05-22 DIAGNOSIS — I771 Stricture of artery: Secondary | ICD-10-CM | POA: Insufficient documentation

## 2014-05-22 DIAGNOSIS — M47814 Spondylosis without myelopathy or radiculopathy, thoracic region: Secondary | ICD-10-CM | POA: Insufficient documentation

## 2014-05-22 DIAGNOSIS — I517 Cardiomegaly: Secondary | ICD-10-CM | POA: Diagnosis not present

## 2014-05-22 DIAGNOSIS — R918 Other nonspecific abnormal finding of lung field: Secondary | ICD-10-CM | POA: Diagnosis not present

## 2014-05-22 DIAGNOSIS — M439 Deforming dorsopathy, unspecified: Secondary | ICD-10-CM | POA: Insufficient documentation

## 2014-05-22 DIAGNOSIS — I10 Essential (primary) hypertension: Secondary | ICD-10-CM | POA: Insufficient documentation

## 2014-05-22 DIAGNOSIS — F17200 Nicotine dependence, unspecified, uncomplicated: Secondary | ICD-10-CM | POA: Insufficient documentation

## 2014-05-22 DIAGNOSIS — Z01818 Encounter for other preprocedural examination: Secondary | ICD-10-CM | POA: Diagnosis present

## 2014-05-22 DIAGNOSIS — Z510 Encounter for antineoplastic radiation therapy: Secondary | ICD-10-CM | POA: Diagnosis not present

## 2014-05-23 ENCOUNTER — Ambulatory Visit
Admission: RE | Admit: 2014-05-23 | Discharge: 2014-05-23 | Disposition: A | Discharge status: Home / Self Care | Payer: Medicare HMO | Source: Ambulatory Visit | Attending: Radiation Oncology | Admitting: Radiation Oncology

## 2014-05-23 DIAGNOSIS — Z510 Encounter for antineoplastic radiation therapy: Secondary | ICD-10-CM | POA: Diagnosis not present

## 2014-05-24 ENCOUNTER — Ambulatory Visit
Admission: RE | Admit: 2014-05-24 | Discharge: 2014-05-24 | Disposition: A | Discharge status: Home / Self Care | Payer: Medicare HMO | Source: Ambulatory Visit | Attending: Radiation Oncology | Admitting: Radiation Oncology

## 2014-05-24 ENCOUNTER — Encounter: Payer: Self-pay | Admitting: Radiation Oncology

## 2014-05-24 VITALS — BP 151/100 | HR 66 | Resp 16 | Wt 204.1 lb

## 2014-05-24 DIAGNOSIS — C61 Malignant neoplasm of prostate: Secondary | ICD-10-CM

## 2014-05-24 DIAGNOSIS — Z510 Encounter for antineoplastic radiation therapy: Secondary | ICD-10-CM | POA: Diagnosis not present

## 2014-05-24 NOTE — Progress Notes (Signed)
  Radiation Oncology         [phone]) [phone] ________________________________  Name: Tony [name] MRN: [id number]  Date: 05/24/2014  DOB: 10-Jun-1941  Weekly Radiation Therapy Management    Current Dose: 32.4 Gy     Planned Dose:  45 Gy  Narrative . . . . . . . . The patient presents for routine under treatment assessment.                                   The patient is without complaint.                                 Set-up films were reviewed.                                 The chart was checked. Physical Findings. . .  weight is 204 lb 1.6 oz (92.579 kg). His blood pressure is 151/100 and his pulse is 66. His respiration is 16. . Weight essentially stable.  No significant changes. Impression . . . . . . . The patient is tolerating radiation. Plan . . . . . . . . . . . . Continue treatment as planned.  ________________________________  Sheral Apley. Tammi Klippel, M.D.

## 2014-05-24 NOTE — Progress Notes (Addendum)
Weight stable. Blood pressure elevated. Denies headache or dizziness. Denies dysuria or hematuria. Reports nocturia x2. Reports a strong steady urine stream when voiding. Denies difficulty emptying his bladder. Denies diarrhea. Has received flu shot. Denies fatigue. Denies pain.

## 2014-05-25 ENCOUNTER — Ambulatory Visit
Admission: RE | Admit: 2014-05-25 | Discharge: 2014-05-25 | Disposition: A | Discharge status: Home / Self Care | Payer: Medicare HMO | Source: Ambulatory Visit | Attending: Radiation Oncology | Admitting: Radiation Oncology

## 2014-05-25 DIAGNOSIS — Z510 Encounter for antineoplastic radiation therapy: Secondary | ICD-10-CM | POA: Diagnosis not present

## 2014-05-28 ENCOUNTER — Ambulatory Visit
Admission: RE | Admit: 2014-05-28 | Discharge: 2014-05-28 | Disposition: A | Discharge status: Home / Self Care | Payer: Medicare HMO | Source: Ambulatory Visit | Attending: Radiation Oncology | Admitting: Radiation Oncology

## 2014-05-28 DIAGNOSIS — Z510 Encounter for antineoplastic radiation therapy: Secondary | ICD-10-CM | POA: Diagnosis not present

## 2014-05-29 ENCOUNTER — Ambulatory Visit
Admission: RE | Admit: 2014-05-29 | Discharge: 2014-05-29 | Disposition: A | Discharge status: Home / Self Care | Payer: Medicare HMO | Source: Ambulatory Visit | Attending: Radiation Oncology | Admitting: Radiation Oncology

## 2014-05-29 DIAGNOSIS — Z510 Encounter for antineoplastic radiation therapy: Secondary | ICD-10-CM | POA: Diagnosis not present

## 2014-05-30 ENCOUNTER — Telehealth: Payer: Self-pay | Admitting: Radiation Oncology

## 2014-05-30 ENCOUNTER — Ambulatory Visit
Admission: RE | Admit: 2014-05-30 | Discharge: 2014-05-30 | Disposition: A | Discharge status: Home / Self Care | Payer: Medicare HMO | Source: Ambulatory Visit | Attending: Radiation Oncology | Admitting: Radiation Oncology

## 2014-05-30 DIAGNOSIS — Z510 Encounter for antineoplastic radiation therapy: Secondary | ICD-10-CM | POA: Diagnosis not present

## 2014-05-30 NOTE — Telephone Encounter (Signed)
Spoke with wife, [name]. She states, "we always arrive early." Understands to arrive around 0830, register, and come down to rad onc nursing for PUT with Dr. Tammi Klippel prior to radiation treatment tomorrow.

## 2014-05-31 ENCOUNTER — Ambulatory Visit
Admission: RE | Admit: 2014-05-31 | Discharge: 2014-05-31 | Disposition: A | Discharge status: Home / Self Care | Payer: Medicare HMO | Source: Ambulatory Visit | Attending: Radiation Oncology | Admitting: Radiation Oncology

## 2014-05-31 ENCOUNTER — Encounter: Payer: Self-pay | Admitting: Radiation Oncology

## 2014-05-31 VITALS — BP 142/112 | HR 75 | Temp 97.8°F | Resp 16 | Wt 204.7 lb

## 2014-05-31 DIAGNOSIS — C61 Malignant neoplasm of prostate: Secondary | ICD-10-CM

## 2014-05-31 DIAGNOSIS — Z510 Encounter for antineoplastic radiation therapy: Secondary | ICD-10-CM | POA: Diagnosis not present

## 2014-05-31 NOTE — Progress Notes (Addendum)
Weekly rad txs prostate 22 completed, no dysuria, no hematuria, gets up 1-2 x night to void, regular bowel movements, b/p left arm=142/112,P=75,RR=16 Right arm B/P=157/113, not on b/p medication says at [facility] b/p is regular, smokes a cigar, appetite good, drinking plenty water, takes a nap after rad txs, mows yard and then next 2 days fatigued, has seed implant scheduled 06/29/14 stated, 8:45 AM

## 2014-05-31 NOTE — Progress Notes (Signed)
  Radiation Oncology         [phone]) [phone] ________________________________  Name: Tony [name] MRN: [id number]  Date: 05/31/2014  DOB: [birth year]  Weekly Radiation Therapy Management     ICD-9-CM ICD-10-CM  1. Malignant neoplasm of prostate 185 C61    Current Dose: 39.6 Gy     Planned Dose:  45 Gy plus seed implant  Narrative . . . . . . . . The patient presents for routine under treatment assessment.                                  Weekly rad txs prostate 22 completed, no dysuria, no hematuria, gets up 1-2 x night to void, regular bowel movements, b/p left arm=142/112,P=75,RR=16  Right arm B/P=157/113, not on b/p medication says at [facility] b/p is regular, smokes a cigar, appetite good, drinking plenty water, takes a nap after rad txs, mows yard and then next 2 days fatigued, has seed implant scheduled 06/29/14 stated                                 Set-up films were reviewed.                                 The chart was checked. Physical Findings. . .  weight is 204 lb 11.2 oz (92.851 kg). His oral temperature is 97.8 F (36.6 C). His blood pressure is 142/112 and his pulse is 75. His respiration is 16. . Weight essentially stable.  No significant changes. Impression . . . . . . . The patient is tolerating radiation. Plan . . . . . . . . . . . . Continue treatment as planned.  Implant on 10/16  ________________________________  Sheral Apley. Tammi Klippel, M.D.

## 2014-06-01 ENCOUNTER — Ambulatory Visit
Admission: RE | Admit: 2014-06-01 | Discharge: 2014-06-01 | Disposition: A | Discharge status: Home / Self Care | Payer: Medicare HMO | Source: Ambulatory Visit | Attending: Radiation Oncology | Admitting: Radiation Oncology

## 2014-06-01 DIAGNOSIS — Z510 Encounter for antineoplastic radiation therapy: Secondary | ICD-10-CM | POA: Diagnosis not present

## 2014-06-03 ENCOUNTER — Emergency Department (HOSPITAL_COMMUNITY)
Admission: EM | Admit: 2014-06-03 | Discharge: 2014-06-03 | Disposition: A | Discharge status: Home / Self Care | Payer: Medicare HMO | Attending: Emergency Medicine | Admitting: Emergency Medicine

## 2014-06-03 ENCOUNTER — Encounter (HOSPITAL_COMMUNITY): Payer: Self-pay | Admitting: Emergency Medicine

## 2014-06-03 DIAGNOSIS — F17200 Nicotine dependence, unspecified, uncomplicated: Secondary | ICD-10-CM | POA: Diagnosis not present

## 2014-06-03 DIAGNOSIS — Z8546 Personal history of malignant neoplasm of prostate: Secondary | ICD-10-CM | POA: Diagnosis not present

## 2014-06-03 DIAGNOSIS — Z880 Allergy status to penicillin: Secondary | ICD-10-CM | POA: Insufficient documentation

## 2014-06-03 DIAGNOSIS — Z79899 Other long term (current) drug therapy: Secondary | ICD-10-CM | POA: Diagnosis not present

## 2014-06-03 DIAGNOSIS — Z791 Long term (current) use of non-steroidal anti-inflammatories (NSAID): Secondary | ICD-10-CM | POA: Diagnosis not present

## 2014-06-03 DIAGNOSIS — R61 Generalized hyperhidrosis: Secondary | ICD-10-CM | POA: Insufficient documentation

## 2014-06-03 DIAGNOSIS — R209 Unspecified disturbances of skin sensation: Secondary | ICD-10-CM | POA: Insufficient documentation

## 2014-06-03 DIAGNOSIS — R202 Paresthesia of skin: Secondary | ICD-10-CM

## 2014-06-03 DIAGNOSIS — IMO0002 Reserved for concepts with insufficient information to code with codable children: Secondary | ICD-10-CM | POA: Diagnosis not present

## 2014-06-03 DIAGNOSIS — I10 Essential (primary) hypertension: Secondary | ICD-10-CM | POA: Diagnosis not present

## 2014-06-03 DIAGNOSIS — Z8679 Personal history of other diseases of the circulatory system: Secondary | ICD-10-CM | POA: Diagnosis not present

## 2014-06-03 HISTORY — DX: Reserved for inherently not codable concepts without codable children: IMO0001

## 2014-06-03 HISTORY — DX: Reserved for concepts with insufficient information to code with codable children: IMO0002

## 2014-06-03 LAB — URINALYSIS, ROUTINE W REFLEX MICROSCOPIC
Bilirubin Urine: NEGATIVE
Glucose, UA: NEGATIVE mg/dL
Hgb urine dipstick: NEGATIVE
Ketones, ur: NEGATIVE mg/dL
LEUKOCYTES UA: NEGATIVE
Nitrite: NEGATIVE
PROTEIN: NEGATIVE mg/dL
Specific Gravity, Urine: 1.01 (ref 1.005–1.030)
Urobilinogen, UA: 0.2 mg/dL (ref 0.0–1.0)
pH: 7 (ref 5.0–8.0)

## 2014-06-03 LAB — CBC WITH DIFFERENTIAL/PLATELET
Basophils Absolute: 0 10*3/uL (ref 0.0–0.1)
Basophils Relative: 0 % (ref 0–1)
EOS PCT: 0 % (ref 0–5)
Eosinophils Absolute: 0 10*3/uL (ref 0.0–0.7)
HEMATOCRIT: 49.9 % (ref 39.0–52.0)
Hemoglobin: 17.4 g/dL — ABNORMAL HIGH (ref 13.0–17.0)
LYMPHS PCT: 14 % (ref 12–46)
Lymphs Abs: 1 10*3/uL (ref 0.7–4.0)
MCH: 32.3 pg (ref 26.0–34.0)
MCHC: 34.9 g/dL (ref 30.0–36.0)
MCV: 92.6 fL (ref 78.0–100.0)
Monocytes Absolute: 0.4 10*3/uL (ref 0.1–1.0)
Monocytes Relative: 5 % (ref 3–12)
Neutro Abs: 5.7 10*3/uL (ref 1.7–7.7)
Neutrophils Relative %: 81 % — ABNORMAL HIGH (ref 43–77)
Platelets: 143 10*3/uL — ABNORMAL LOW (ref 150–400)
RBC: 5.39 MIL/uL (ref 4.22–5.81)
RDW: 13.2 % (ref 11.5–15.5)
WBC: 7.1 10*3/uL (ref 4.0–10.5)

## 2014-06-03 LAB — COMPREHENSIVE METABOLIC PANEL
ALT: 30 U/L (ref 0–53)
ANION GAP: 13 (ref 5–15)
AST: 27 U/L (ref 0–37)
Albumin: 3.8 g/dL (ref 3.5–5.2)
Alkaline Phosphatase: 72 U/L (ref 39–117)
BUN: 18 mg/dL (ref 6–23)
CO2: 23 meq/L (ref 19–32)
CREATININE: 0.71 mg/dL (ref 0.50–1.35)
Calcium: 9.2 mg/dL (ref 8.4–10.5)
Chloride: 103 mEq/L (ref 96–112)
GFR calc Af Amer: >90 mL/min (ref >90)
Glucose, Bld: 95 mg/dL (ref 70–99)
Potassium: 4.5 mEq/L (ref 3.7–5.3)
Sodium: 139 mEq/L (ref 137–147)
Total Bilirubin: 0.4 mg/dL (ref 0.3–1.2)
Total Protein: 7.1 g/dL (ref 6.0–8.3)

## 2014-06-03 NOTE — ED Notes (Signed)
Per EMS, pt states he has had hot flashes. Was given book to read regarding sx's of radiation and cancer treatment. He has been having hot flashes since Friday.  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

## 2014-06-03 NOTE — Discharge Instructions (Signed)
Diaphoresis Sweating is controlled by our nervous system. Sweat glands are found in the skin throughout the body. They exist in higher numbers in the skin of the hands, feet, armpits, and the genital region. Sweating occurs normally when the temperature of your body goes up. Diaphoresis means profuse sweating or perspiration due to an underlying medical condition or an external factor (such as medicines). Hyperhidrosis means excessive sweating that is not usually due to an underlying medical condition, on areas such as the palms, soles, or armpits. Other areas of the body may also be affected. Hyperhidrosis usually begins in childhood or early adolescence. It increases in severity through puberty and into adulthood. Sweaty palms are the most common problem and the most bothersome to people who have hyperhidrosis. CAUSES  Sweating is normally seen with exercise or being in hot surroundings. Not sweating in these conditions would be harmful. Stressful situations can also cause sweating. In some people, stimulation of the sweat glands during stress is overactive. Talking to strangers or shaking someone's hand can produce profuse sweating. Causes of sweating include:  Emotional upset.  Low blood pressure.  Low blood sugar.  Heart problems.  Low blood cell counts.  Certain pain relieving medicines.  Exercise.  Alcohol.  Infection.  Caffeine.  Spicy foods.  Hot flashes.  Overactive thyroid.  Illegal drug use, such as cocaine and amphetamine.  Use of medicines that stimulate parts of the nervous system.  A tumor (pheochromocytoma).  Withdrawal from some medicines or alcohol. DIAGNOSIS  Your caregiver needs to be consulted to make sure excessive sweating is not caused by another condition. Further testing may need to be done. TREATMENT   When hyperhidrosis is caused by another condition, that condition should be treated.  If menopause is the cause, you may wish to talk to your  caregiver about estrogen replacement.  If the hyperhidrosis is a natural happening of the way your body works, certain antiperspirants may help.  If medicines do not work, injections of botulinum toxin type A are sometimes used for underarm sweating.  Your caregiver can usually help you with this problem. Document Released: 03/23/2005 Document Revised: 11/23/2011 Document Reviewed: 10/08/2008 [facility] Patient Information 2015 [place], [place]. This information is not intended to replace advice given to you by your health care provider. Make sure you discuss any questions you have with your health care provider.     Paresthesia Paresthesia is an abnormal burning or prickling sensation. This sensation is generally felt in the hands, arms, legs, or feet. However, it may occur in any part of the body. It is usually not painful. The feeling may be described as:  Tingling or numbness.  "Pins and needles."  Skin crawling.  Buzzing.  Limbs "falling asleep."  Itching. Most people experience temporary (transient) paresthesia at some time in their lives. CAUSES  Paresthesia may occur when you breathe too quickly (hyperventilation). It can also occur without any apparent cause. Commonly, paresthesia occurs when pressure is placed on a nerve. The feeling quickly goes away once the pressure is removed. For some people, however, paresthesia is a long-lasting (chronic) condition caused by an underlying disorder. The underlying disorder may be:  A traumatic, direct injury to nerves. Examples include a:  Broken (fractured) neck.  Fractured skull.  A disorder affecting the brain and spinal cord (central nervous system). Examples include:  Transverse myelitis.  Encephalitis.  Transient ischemic attack.  Multiple sclerosis.  Stroke.  Tumor or blood vessel problems, such as an arteriovenous malformation pressing against the  brain or spinal cord.  A condition that damages the peripheral  nerves (peripheral neuropathy). Peripheral nerves are not part of the brain and spinal cord. These conditions include:  Diabetes.  Peripheral vascular disease.  Nerve entrapment syndromes, such as carpal tunnel syndrome.  Shingles.  Hypothyroidism.  Vitamin B12 deficiencies.  Alcoholism.  Heavy metal poisoning (lead, arsenic).  Rheumatoid arthritis.  Systemic lupus erythematosus. DIAGNOSIS  Your caregiver will attempt to find the underlying cause of your paresthesia. Your caregiver may:  Take your medical history.  Perform a physical exam.  Order various lab tests.  Order imaging tests. TREATMENT  Treatment for paresthesia depends on the underlying cause. HOME CARE INSTRUCTIONS  Avoid drinking alcohol.  You may consider massage or acupuncture to help relieve your symptoms.  Keep all follow-up appointments as directed by your caregiver. SEEK IMMEDIATE MEDICAL CARE IF:   You feel weak.  You have trouble walking or moving.  You have problems with speech or vision.  You feel confused.  You cannot control your bladder or bowel movements.  You feel numbness after an injury.  You faint.  Your burning or prickling feeling gets worse when walking.  You have pain, cramps, or dizziness.  You develop a rash. MAKE SURE YOU:  Understand these instructions.  Will watch your condition.  Will get help right away if you are not doing well or get worse. Document Released: 08/21/2002 Document Revised: 11/23/2011 Document Reviewed: 05/22/2011 [facility] Patient Information 2015 [place], [place]. This information is not intended to replace advice given to you by your health care provider. Make sure you discuss any questions you have with your health care provider.

## 2014-06-03 NOTE — ED Notes (Signed)
Pt recieved hormone injection, in July and will receive another in October.  Pt reports feeling dizzy, denies falling. Ambulates with a cane. He c/o pressure behind eyes .

## 2014-06-03 NOTE — ED Provider Notes (Signed)
CSN: [id number]     Arrival date & time 06/03/14  4481 History   First MD Initiated Contact with Patient 06/03/14 [phone]     Chief Complaint  Patient presents with  . Hot Flashes     (Consider location/radiation/quality/duration/timing/severity/associated sxs/prior Treatment) HPI 73 year old male presents with intermittent diaphoresis and chills throughout the night last night. Approximately 1:30 AM he is having trouble sleeping he noticed that he is breaking out into sweats. He would walk his dog when he came back he broke out into a cold sweat again. After the shower started having cold chills. This seemed alternate throughout the night. He also felt transient weakness in his legs when he is walking but did not fall. Right before he came into the hospital he felt diffuse tingling all over his body. Did not feel any focal weakness. No headaches. He is currently nearing the end of his 5 week radiation course for prostate cancer. He is also receiving hormone therapy injections for the prostate but is unsure of the name. Has had a mild cough but denies dyspnea. Denies any chest pain or heaviness during the episodes. Denies any fevers. He has felt some discomfort with urinating over the last couple days. Denies any hematuria. No diarrhea or rectal bleeding.  Past Medical History  Diagnosis Date  . 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 Hypertension   . Sinus problem   . Radiation    Past Surgical History  Procedure Laterality Date  . Prostate biopsy  02/08/14    gleason 4+5=9, 12/12 cores positive, 54 gm   Family History  Problem Relation Age of Onset  . Alzheimer's disease Mother   . Alzheimer's disease Father   . Cancer Father     prostate  . Arthritis Sister   . Cancer Brother     prostate   History  Substance Use Topics  . Smoking status: Current Every Day Smoker    Types: Cigars  . Smokeless tobacco: Not on file  . Alcohol Use: Yes     Comment: 1 beer daily     Review of Systems  Constitutional: Positive for chills and diaphoresis. Negative for fever.  Respiratory: Positive for cough. Negative for shortness of breath.   Cardiovascular: Negative for chest pain.  Gastrointestinal: Negative for vomiting, abdominal pain and diarrhea.  Genitourinary: Positive for dysuria. Negative for hematuria.  Neurological: Positive for weakness and numbness. Negative for headaches.  All other systems reviewed and are negative.     Allergies  Penicillins  Home Medications   Prior to Admission medications   Medication Sig Start Date End Date Taking? Authorizing Provider  fluticasone (FLONASE) 50 MCG/ACT nasal spray Place 1 spray into both nostrils daily as needed for allergies or rhinitis.   Yes Historical Provider, MD  Multiple Vitamin (MULTIVITAMIN WITH MINERALS) TABS tablet Take 1 tablet by mouth daily.   Yes Historical Provider, MD  naproxen sodium (ANAPROX) 220 MG tablet Take 220 mg by mouth 2 (two) times daily as needed (for soreness).    Yes Historical Provider, MD   BP 158/99  Pulse 81  Temp(Src) 97.7 F (36.5 C) (Oral)  Resp 20  SpO2 95% Physical Exam  Nursing note and vitals reviewed. Constitutional: He is oriented to person, place, and time. He appears well-developed and well-nourished.  HENT:  Head: Normocephalic and atraumatic.  Right Ear: External ear normal.  Left Ear: External ear normal.  Nose: Nose normal.  Eyes: EOM are normal. Pupils are  equal, round, and reactive to light. Right eye exhibits no discharge. Left eye exhibits no discharge.  Neck: Neck supple.  Cardiovascular: Normal rate, regular rhythm, normal heart sounds and intact distal pulses.   Pulmonary/Chest: Effort normal and breath sounds normal.  Abdominal: Soft. He exhibits no distension. There is no tenderness.  Neurological: He is alert and oriented to person, place, and time.  CN 2-12 grossly intact. 5/5 strength in all 4 extremities. Normal gross sensation.  Normal finger to nose.  Skin: Skin is warm and dry.    ED Course  Procedures (including critical care time) Labs Review Labs Reviewed  CBC WITH DIFFERENTIAL - Abnormal; Notable for the following:    Hemoglobin 17.4 (*)    Platelets 143 (*)    Neutrophils Relative % 81 (*)    All other components within normal limits  COMPREHENSIVE METABOLIC PANEL  URINALYSIS, ROUTINE W REFLEX MICROSCOPIC    Imaging Review No results found.   EKG Interpretation None      MDM   Final diagnoses:  Diaphoresis  Tingling    No obvious cause for patient's transient tingling and diaphoresis. His neurologic exam is normal. Is not consistent with a stroke. A not feel he needs CT imaging of his head at this time. He's not have any documented fevers, given his radiation to his prostate a urinalysis was obtained and is negative. His electrolytes are normal. At this time he feels completely asymptomatic and his symptoms are likely coming from his radiation and hormone therapy. I feel this time is stable for discharge and will follow up with his urologist closely as an outpatient.    Ephraim Hamburger, MD 06/03/14 1550

## 2014-06-03 NOTE — ED Notes (Signed)
Bed: WA17 Expected date:  Expected time:  Means of arrival:  Comments: EMS 35M weakness, tingling CA patient

## 2014-06-04 ENCOUNTER — Ambulatory Visit
Admission: RE | Admit: 2014-06-04 | Discharge: 2014-06-04 | Disposition: A | Discharge status: Home / Self Care | Payer: Medicare HMO | Source: Ambulatory Visit | Attending: Radiation Oncology | Admitting: Radiation Oncology

## 2014-06-04 ENCOUNTER — Encounter: Payer: Self-pay | Admitting: Radiation Oncology

## 2014-06-04 VITALS — BP 129/100 | HR 88 | Temp 97.7°F | Resp 16 | Wt 206.7 lb

## 2014-06-04 DIAGNOSIS — C61 Malignant neoplasm of prostate: Secondary | ICD-10-CM

## 2014-06-04 DIAGNOSIS — Z510 Encounter for antineoplastic radiation therapy: Secondary | ICD-10-CM | POA: Diagnosis not present

## 2014-06-04 MED ORDER — LORAZEPAM 1 MG PO TABS: 1.0000 mg | ORAL_TABLET | Freq: Every day | ORAL | Status: DC

## 2014-06-04 NOTE — Progress Notes (Signed)
  Radiation Oncology         [phone]) [phone] ________________________________  Name: Tony [name]. Nan MRN: [id number]  Date: 06/04/2014  DOB: 1940-11-08  Weekly Radiation Therapy Management    ICD-9-CM ICD-10-CM  1. Malignant neoplasm of prostate 185 C61    Current Dose: 45 Gy     Planned Dose:  45 Gy  Narrative . . . . . . . . The patient presents for routine under treatment assessment.                                   Anxiety attack yesterday.                                 Set-up films were reviewed.                                 The chart was checked. Physical Findings. . .  weight is 206 lb 11.2 oz (93.759 kg). His oral temperature is 97.7 F (36.5 C). His blood pressure is 129/100 and his pulse is 88. His respiration is 16 and oxygen saturation is 96%. . Weight essentially stable.  No significant changes. Impression . . . . . . . The patient is tolerating radiation. Plan . . . . . . . . . . . . Complete radiation, next seed implant. Started Ativan.  ________________________________  Sheral Apley Tammi Klippel, M.D.

## 2014-06-04 NOTE — Progress Notes (Signed)
Pt reports he is only sleeping 3 hrs at night with an 1 hr nap in the afternoon.  He went to the ER yesterday, which they drew blood and couldn't diagnosis anything.  He reports feeling tingling in his hands, shaking, and hot flashes.  He reports burning occasionally with urination, no change in urine color or odor. No flank pain.  He reports urinating 2 times a night.

## 2014-06-07 NOTE — Progress Notes (Signed)
  Radiation Oncology         [phone]) [phone] ________________________________  Name: Tony [name] MRN: [id number]  Date: 06/04/2014  DOB: 05-17-1941  End of Treatment Note  Diagnosis:   73 y.o. 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 the prostate was treated with external radiation to 45Gy in 25 fractions 2. the prostate will be boosted using interstitial brachytherapy with an additional 110 Gy for a total abdominal dose of 155 Gy  Beams/energy:    1.  the prostate was treated using 4 external radiation fields from the anterior, posterior, right lateral, and left lateral gantry orientation's with 15 megavolt photons applied each field. He was immobilized with a body fix device and treated with image guided radiotherapy 2. The interstitial seed implant boost will be carried out using iodine-125 seeds on 06/29/2014  Narrative: The patient tolerated radiation treatment relatively well.   He noted some fatigue during radiation and would take naps as a result.  Plan: The patient has completed radiation treatment. The patient will return for prostate seed implant on 06/29/2014. I advised them to call or return sooner if they have any questions or concerns related to their recovery or treatment. ________________________________  Sheral Apley. Tammi Klippel, M.D.

## 2014-06-21 ENCOUNTER — Telehealth: Payer: Self-pay | Admitting: *Deleted

## 2014-06-21 NOTE — Telephone Encounter (Signed)
Called patient to remind of labs for 06-22-14, lvm for a return call

## 2014-06-22 DIAGNOSIS — C61 Malignant neoplasm of prostate: Secondary | ICD-10-CM | POA: Diagnosis not present

## 2014-06-22 DIAGNOSIS — Z880 Allergy status to penicillin: Secondary | ICD-10-CM | POA: Diagnosis not present

## 2014-06-22 DIAGNOSIS — F17210 Nicotine dependence, cigarettes, uncomplicated: Secondary | ICD-10-CM | POA: Diagnosis not present

## 2014-06-22 LAB — CBC
HCT: 47.4 % (ref 39.0–52.0)
Hemoglobin: 16.6 g/dL (ref 13.0–17.0)
MCH: 32.4 pg (ref 26.0–34.0)
MCHC: 35 g/dL (ref 30.0–36.0)
MCV: 92.6 fL (ref 78.0–100.0)
PLATELETS: 173 10*3/uL (ref 150–400)
RBC: 5.12 MIL/uL (ref 4.22–5.81)
RDW: 13.3 % (ref 11.5–15.5)
WBC: 6.6 10*3/uL (ref 4.0–10.5)

## 2014-06-22 LAB — COMPREHENSIVE METABOLIC PANEL
ALBUMIN: 3.7 g/dL (ref 3.5–5.2)
ALT: 29 U/L (ref 0–53)
AST: 31 U/L (ref 0–37)
Alkaline Phosphatase: 72 U/L (ref 39–117)
Anion gap: 13 (ref 5–15)
BILIRUBIN TOTAL: 0.6 mg/dL (ref 0.3–1.2)
BUN: 12 mg/dL (ref 6–23)
CHLORIDE: 103 meq/L (ref 96–112)
CO2: 23 mEq/L (ref 19–32)
CREATININE: 0.78 mg/dL (ref 0.50–1.35)
Calcium: 9.3 mg/dL (ref 8.4–10.5)
GFR calc Af Amer: >90 mL/min (ref >90)
GFR calc non Af Amer: 89 mL/min — ABNORMAL LOW (ref >90)
Glucose, Bld: 112 mg/dL — ABNORMAL HIGH (ref 70–99)
Potassium: 4.2 mEq/L (ref 3.7–5.3)
Sodium: 139 mEq/L (ref 137–147)
TOTAL PROTEIN: 7 g/dL (ref 6.0–8.3)

## 2014-06-22 LAB — PROTIME-INR
INR: 1.04 (ref 0.00–1.49)
PROTHROMBIN TIME: 13.7 s (ref 11.6–15.2)

## 2014-06-22 LAB — APTT: APTT: 27 s (ref 24–37)

## 2014-06-26 ENCOUNTER — Encounter (HOSPITAL_BASED_OUTPATIENT_CLINIC_OR_DEPARTMENT_OTHER): Payer: Self-pay | Admitting: *Deleted

## 2014-06-26 NOTE — Progress Notes (Signed)
To [facility] AT [id number] in epic-.Instructed to complete enema in am.NPO after MN.refrain from smoking also.

## 2014-06-28 ENCOUNTER — Telehealth: Payer: Self-pay | Admitting: *Deleted

## 2014-06-28 NOTE — Telephone Encounter (Signed)
Called patient to remind of procedure for 06-29-14, lvm for a return call

## 2014-06-29 ENCOUNTER — Ambulatory Visit (HOSPITAL_COMMUNITY): Payer: Medicare HMO

## 2014-06-29 ENCOUNTER — Ambulatory Visit (HOSPITAL_BASED_OUTPATIENT_CLINIC_OR_DEPARTMENT_OTHER)
Admission: RE | Admit: 2014-06-29 | Discharge: 2014-06-29 | Disposition: A | Discharge status: Home / Self Care | Payer: Medicare HMO | Source: Ambulatory Visit | Attending: Urology | Admitting: Urology

## 2014-06-29 ENCOUNTER — Ambulatory Visit (HOSPITAL_BASED_OUTPATIENT_CLINIC_OR_DEPARTMENT_OTHER): Payer: Medicare HMO | Admitting: Anesthesiology

## 2014-06-29 ENCOUNTER — Encounter (HOSPITAL_BASED_OUTPATIENT_CLINIC_OR_DEPARTMENT_OTHER): Payer: Self-pay | Admitting: Anesthesiology

## 2014-06-29 ENCOUNTER — Encounter (HOSPITAL_BASED_OUTPATIENT_CLINIC_OR_DEPARTMENT_OTHER)
Admission: RE | Disposition: A | Discharge status: Home / Self Care | Payer: Self-pay | Source: Ambulatory Visit | Attending: Urology

## 2014-06-29 ENCOUNTER — Encounter (HOSPITAL_BASED_OUTPATIENT_CLINIC_OR_DEPARTMENT_OTHER): Payer: Medicare HMO | Admitting: Anesthesiology

## 2014-06-29 DIAGNOSIS — F17210 Nicotine dependence, cigarettes, uncomplicated: Secondary | ICD-10-CM | POA: Insufficient documentation

## 2014-06-29 DIAGNOSIS — Z880 Allergy status to penicillin: Secondary | ICD-10-CM | POA: Insufficient documentation

## 2014-06-29 DIAGNOSIS — C61 Malignant neoplasm of prostate: Secondary | ICD-10-CM | POA: Insufficient documentation

## 2014-06-29 HISTORY — PX: RADIOACTIVE SEED IMPLANT: SHX5150

## 2014-06-29 SURGERY — INSERTION, RADIATION SOURCE, PROSTATE
Anesthesia: General | Site: Prostate

## 2014-06-29 MED ORDER — MIDAZOLAM HCL 2 MG/2ML IJ SOLN
INTRAMUSCULAR | Status: AC
  Filled 2014-06-29: qty 2

## 2014-06-29 MED ORDER — LACTATED RINGERS IV SOLN
INTRAVENOUS | Status: DC
  Administered 2014-06-29 (×3): via INTRAVENOUS
  Filled 2014-06-29: qty 1000

## 2014-06-29 MED ORDER — FENTANYL CITRATE 0.05 MG/ML IJ SOLN
25.0000 ug | INTRAMUSCULAR | Status: DC | PRN
  Filled 2014-06-29: qty 1

## 2014-06-29 MED ORDER — FENTANYL CITRATE 0.05 MG/ML IJ SOLN
INTRAMUSCULAR | Status: AC
  Filled 2014-06-29: qty 4

## 2014-06-29 MED ORDER — LIDOCAINE HCL (CARDIAC) 20 MG/ML IV SOLN
INTRAVENOUS | Status: DC | PRN
  Administered 2014-06-29: 40 mg via INTRAVENOUS
  Administered 2014-06-29: 60 mg via INTRAVENOUS

## 2014-06-29 MED ORDER — FENTANYL CITRATE 0.05 MG/ML IJ SOLN
INTRAMUSCULAR | Status: DC | PRN
  Administered 2014-06-29 (×2): 25 ug via INTRAVENOUS
  Administered 2014-06-29: 50 ug via INTRAVENOUS
  Administered 2014-06-29: 25 ug via INTRAVENOUS

## 2014-06-29 MED ORDER — PROPOFOL 10 MG/ML IV BOLUS
INTRAVENOUS | Status: DC | PRN
  Administered 2014-06-29: 20 mg via INTRAVENOUS
  Administered 2014-06-29: 30 mg via INTRAVENOUS
  Administered 2014-06-29: 50 mg via INTRAVENOUS
  Administered 2014-06-29: 200 mg via INTRAVENOUS

## 2014-06-29 MED ORDER — KETOROLAC TROMETHAMINE 30 MG/ML IJ SOLN
15.0000 mg | Freq: Once | INTRAMUSCULAR | Status: DC | PRN
  Filled 2014-06-29: qty 1

## 2014-06-29 MED ORDER — ONDANSETRON HCL 4 MG/2ML IJ SOLN
INTRAMUSCULAR | Status: DC | PRN
  Administered 2014-06-29: 4 mg via INTRAVENOUS

## 2014-06-29 MED ORDER — EPHEDRINE SULFATE 50 MG/ML IJ SOLN
INTRAMUSCULAR | Status: DC | PRN
  Administered 2014-06-29 (×2): 10 mg via INTRAVENOUS

## 2014-06-29 MED ORDER — MIDAZOLAM HCL 5 MG/5ML IJ SOLN
INTRAMUSCULAR | Status: DC | PRN
  Administered 2014-06-29: 1 mg via INTRAVENOUS

## 2014-06-29 MED ORDER — CIPROFLOXACIN IN D5W 400 MG/200ML IV SOLN
INTRAVENOUS | Status: AC
  Filled 2014-06-29: qty 200

## 2014-06-29 MED ORDER — CIPROFLOXACIN HCL 500 MG PO TABS: 500.0000 mg | ORAL_TABLET | Freq: Two times a day (BID) | ORAL | Status: DC

## 2014-06-29 MED ORDER — CIPROFLOXACIN IN D5W 400 MG/200ML IV SOLN
400.0000 mg | INTRAVENOUS | Status: AC
  Administered 2014-06-29: 400 mg via INTRAVENOUS
  Filled 2014-06-29: qty 200

## 2014-06-29 MED ORDER — METOCLOPRAMIDE HCL 5 MG/ML IJ SOLN
INTRAMUSCULAR | Status: DC | PRN
  Administered 2014-06-29: 10 mg via INTRAVENOUS

## 2014-06-29 MED ORDER — HYDROCODONE-ACETAMINOPHEN 5-325 MG PO TABS: 1.0000 | ORAL_TABLET | Freq: Four times a day (QID) | ORAL | Status: DC | PRN

## 2014-06-29 MED ORDER — STERILE WATER FOR IRRIGATION IR SOLN
Status: DC | PRN
  Administered 2014-06-29: 3000 mL

## 2014-06-29 MED ORDER — FAMOTIDINE IN NACL 20-0.9 MG/50ML-% IV SOLN
20.0000 mg | Freq: Once | INTRAVENOUS | Status: AC
  Administered 2014-06-29: 20 mg via INTRAVENOUS
  Filled 2014-06-29: qty 50

## 2014-06-29 MED ORDER — PROMETHAZINE HCL 25 MG/ML IJ SOLN
6.2500 mg | INTRAMUSCULAR | Status: DC | PRN
  Filled 2014-06-29: qty 1

## 2014-06-29 MED ORDER — DEXAMETHASONE SODIUM PHOSPHATE 4 MG/ML IJ SOLN
INTRAMUSCULAR | Status: DC | PRN
  Administered 2014-06-29: 8 mg via INTRAVENOUS

## 2014-06-29 MED ORDER — ACETAMINOPHEN 10 MG/ML IV SOLN
INTRAVENOUS | Status: DC | PRN
  Administered 2014-06-29: 1000 mg via INTRAVENOUS

## 2014-06-29 MED ORDER — IOHEXOL 350 MG/ML SOLN
INTRAVENOUS | Status: DC | PRN
  Administered 2014-06-29: 7 mL

## 2014-06-29 MED ORDER — FLEET ENEMA 7-19 GM/118ML RE ENEM
1.0000 | ENEMA | Freq: Once | RECTAL | Status: DC
  Filled 2014-06-29: qty 1

## 2014-06-29 SURGICAL SUPPLY — 21 items
BAG URINE DRAINAGE (UROLOGICAL SUPPLIES) ×3 IMPLANT
BLADE CLIPPER SURG (BLADE) ×3 IMPLANT
CATH FOLEY 2WAY SLVR  5CC 16FR (CATHETERS) ×4
CATH FOLEY 2WAY SLVR 5CC 16FR (CATHETERS) ×2 IMPLANT
CATH ROBINSON RED A/P 20FR (CATHETERS) ×3 IMPLANT
CLOTH BEACON ORANGE TIMEOUT ST (SAFETY) ×3 IMPLANT
COVER MAYO STAND STRL (DRAPES) ×3 IMPLANT
COVER TABLE BACK 60X90 (DRAPES) ×3 IMPLANT
DRSG TEGADERM 4X4.75 (GAUZE/BANDAGES/DRESSINGS) ×3 IMPLANT
DRSG TEGADERM 8X12 (GAUZE/BANDAGES/DRESSINGS) ×3 IMPLANT
GLOVE BIO SURGEON STRL SZ7.5 (GLOVE) ×6 IMPLANT
GLOVE ECLIPSE 8.0 STRL XLNG CF (GLOVE) ×18 IMPLANT
GOWN STRL REIN XL XLG (GOWN DISPOSABLE) ×3 IMPLANT
HOLDER FOLEY CATH W/STRAP (MISCELLANEOUS) ×3 IMPLANT
PACK CYSTO (CUSTOM PROCEDURE TRAY) ×3 IMPLANT
PLUG CATH AND CAP STER (CATHETERS) ×3 IMPLANT
SPONGE GAUZE 4X4 12PLY STER LF (GAUZE/BANDAGES/DRESSINGS) ×3 IMPLANT
SYRINGE 10CC LL (SYRINGE) ×6 IMPLANT
UNDERPAD 30X30 INCONTINENT (UNDERPADS AND DIAPERS) ×6 IMPLANT
WATER STERILE IRR 500ML POUR (IV SOLUTION) ×3 IMPLANT
selectSeed I-152 ×195 IMPLANT

## 2014-06-29 NOTE — Anesthesia Procedure Notes (Signed)
Procedure Name: LMA Insertion Date/Time: 06/29/2014 10:59 AM Performed by: Mechele Claude Pre-anesthesia Checklist: Patient identified, Emergency Drugs available, Suction available and Patient being monitored Patient Re-evaluated:Patient Re-evaluated prior to inductionOxygen Delivery Method: Circle System Utilized Preoxygenation: Pre-oxygenation with 100% oxygen Intubation Type: IV induction Ventilation: Mask ventilation without difficulty LMA: LMA with gastric port inserted LMA Size: 5.0 Number of attempts: 3 Placement Confirmation: positive ETCO2 Tube secured with: Tape Dental Injury: Teeth and Oropharynx as per pre-operative assessment  Comments: 4 supreme attempted first try with large leak, 5 supreme attempted second try with large leak, 5 supreme successfully placed with good placement by Dr. Kalman Shan.

## 2014-06-29 NOTE — Op Note (Signed)
Preoperative diagnosis: Clinical stage TI C adenocarcinoma the prostate  Postoperative diagnosis: Same  Procedure: I-125 prostate seed implantation( boost) with Nucletron robotic implanter  Surgeon: Bernestine Amass M.D. , Ledon Snare, M.D.  Anesthesia: Gen.  Indications: Patient  was diagnosed with clinical stage TI C prostate cancer. We had extensive discussion with him about treatment options versus. He elected to proceed with seed implantation. He underwent consultation my office as well as with Dr. Ledon Snare. He appeared to understand the advantages disadvantages potential risks of this treatment option. Full informed consent has been obtained. The patient is had preoperative ciprofloxacin. PAS compression boots were placed.  Technique and findings: Patient was brought the operating room where he had successful induction of general anesthesia. He was placed in lithotomy position and prepped and draped in usual manner. Appropriate surgical timeout was performed. Radiation oncology department placed a transrectal ultrasound probe anchoring stand. Foley catheter with contrast in the balloon was inserted without difficulty. Anchoring needles were placed within the prostate. Real-time contouring of the urethra prostate and rectum were performed and the dosing parameters were established. Targeted dose was 110 gray. We then came to the operating suite suite for placement of the needles. A second timeout was performed. All needle passage was done with real-time transrectal ultrasound guidance with the sagittal plane. A total of 26 needles were placed. See implantation itself was done with the robotic implanter. 65 active seeds were implanted. A Foley catheter was removed and flexible cystoscopy failed to show any seeds outside the prostate. The Foley catheter was inserted which drained clear urine. The patient was brought to recovery room in stable condition.

## 2014-06-29 NOTE — Interval H&P Note (Signed)
History and Physical Interval Note:  06/29/2014 10:41 AM  Tony [name]  has presented today for surgery, with the diagnosis of PROSATATE CANCER  The various methods of treatment have been discussed with the patient and family. After consideration of risks, benefits and other options for treatment, the patient has consented to  Procedure(s): RADIOACTIVE SEED IMPLANT (N/A) as a surgical intervention .  The patient's history has been reviewed, patient examined, no change in status, stable for surgery.  I have reviewed the patient's chart and labs.  Questions were answered to the patient's satisfaction.     Rihanna Marseille S

## 2014-06-29 NOTE — Anesthesia Postprocedure Evaluation (Signed)
  Anesthesia Post-op Note  Patient: Tony [name]  Procedure(s) Performed: Procedure(s) (LRB): RADIOACTIVE SEED IMPLANT (N/A)  Patient Location: PACU  Anesthesia Type: General  Level of Consciousness: awake and alert   Airway and Oxygen Therapy: Patient Spontanous Breathing  Post-op Pain: mild  Post-op Assessment: Post-op Vital signs reviewed, Patient's Cardiovascular Status Stable, Respiratory Function Stable, Patent Airway and No signs of Nausea or vomiting  Last Vitals:  Filed Vitals:   06/29/14 1330  BP: 144/90  Pulse: 67  Temp:   Resp: 16    Post-op Vital Signs: stable   Complications: No apparent anesthesia complications

## 2014-06-29 NOTE — Transfer of Care (Signed)
Immediate Anesthesia Transfer of Care Note  Patient: Tony [name]. Markus  Procedure(s) Performed: Procedure(s) (LRB): RADIOACTIVE SEED IMPLANT (N/A)  Patient Location: PACU  Anesthesia Type: General  Level of Consciousness: sleepy  Airway & Oxygen Therapy: Patient Spontanous Breathing and Patient connected to face mask oxygen, oral airway remaning  Post-op Assessment: Report given to PACU RN and Post -op Vital signs reviewed and stable  Post vital signs: Reviewed and stable  Complications: No apparent anesthesia complications

## 2014-06-29 NOTE — Discharge Instructions (Signed)
DISCHARGE INSTRUCTIONS FOR PROSTATE SEED IMPLANTATION ° °Removal of catheter °Remove the foley catheter after 24 hours ( day after the procedure).can be done easily by cutting the side port of the catheter, whichallow the balloon to deflate.  You will see 1-2 teaspoons of clear water as the balloon deflates and then the catheter can be slid out without difficulty. ° ° °     Cut here ° °Antibiotics °You may be given a prescription for an antibiotic to take when you arrive home. If so, be sure to take every tablet in the bottle, even if you are feeling better before the prescription is finished. If you begin itching, notice a rash or start to swell on your trunk, arms, legs and/or throat, immediately stop taking the antibiotic and call your Urologist. °Diet °Resume your usual diet when you return home. To keep your bowels moving easily and softly, drink prune, apple and cranberry juice at room temperature. You may also take a stool softener, such as Colace, which is available without prescription at local pharmacies. °Daily activities °  No driving or heavy lifting for at least two days after the implant. °  No bike riding, horseback riding or riding lawn mowers for the first month after the implant. °  Any strenuous physical activity should be approved by your doctor before you resume it. °Sexual relations °You may resume sexual relations two weeks after the procedure. A condom should be used for the first two weeks. Your semen may be dark brown or black; this is normal and is related bleeding that may have occurred during the implant. °Postoperative swelling °Expect swelling and bruising of the scrotum and perineum (the area between the scrotum and anus). Both the swelling and the bruising should resolve in l or 2 weeks. Ice packs and over- the-counter medications such as Tylenol, Advil or Aleve may lessen your discomfort. °Postoperative urination °Most men experience burning on urination and/or urinary frequency.  If this becomes bothersome, contact your Urologist.  Medication can be prescribed to relieve these problems.  It is normal to have some blood in your urine for a few days after the implant. °Special instructions related to the seeds °It is unlikely that you will pass an Iodine-125 seed in your urine. The seeds are silver in color and are about as large as a grain of rice. If you pass a seed, do not handle it with your fingers. Use a spoon to place it in an envelope or jar in place this in base occluded area such as the garage or basement for return to the radiation clinic at your convenience. ° °Contact your doctor for °  Temperature greater than 101 F °  Increasing pain °  Inability to urinate °Follow-up ° You should have follow up with your urologist and radiation oncologist about 3 weeks after the procedure. °General information regarding Iodine seeds °  Iodine-125 is a low energy radioactive material. It is not deeply penetrating and loses energy at short distances. Your prostate will absorb the radiation. Objects that are touched or used by the patient do not become radioactive. °  Body wastes (urine and stool) or body fluids (saliva, tears, semen or blood) are not radioactive. °  The Nuclear Regulatory Commission (NRC) has determined that no radiation precautions are needed for patients undergoing Iodine-125 seed implantation. The NRC states that such patients do not present a risk to the people around them, including young children and pregnant women. However, in keeping with the general principle   that radiation exposure should be kept as low reasonably possible, we suggest the following:   Children and pets should not sit on the patient's lap for the first two (2) weeks after the implant.   Pregnant (or possibly pregnant) women should avoid prolonged, close contact with the patient for the first two (2) weeks after the implant.   A distance of three (3) feet is acceptable.   At a distance of three (3)  feet, there is no limit to the length of time anyone can be with the patient.  Post Anesthesia Home Care Instructions  Activity: Get plenty of rest for the remainder of the day. A responsible adult should stay with you for 24 hours following the procedure.  For the next 24 hours, DO NOT: -Drive a car -Paediatric nurse -Drink alcoholic beverages -Take any medication unless instructed by your physician -Make any legal decisions or sign important papers.  Meals: Start with liquid foods such as gelatin or soup. Progress to regular foods as tolerated. Avoid greasy, spicy, heavy foods. If nausea and/or vomiting occur, drink only clear liquids until the nausea and/or vomiting subsides. Call your physician if vomiting continues.  Special Instructions/Symptoms: Your throat may feel dry or sore from the anesthesia or the breathing tube placed in your throat during surgery. If this causes discomfort, gargle with warm salt water. The discomfort should disappear within 24 hours. Indwelling Urinary Catheter Care You have been given a flexible tube (catheter) used to drain the bladder. Catheters are often used when a person has difficulty urinating due to blockage, bleeding, infection, or inability to control bladder or bowel movements (incontinence). A catheter requires daily care to prevent infection and blockage. HOME CARE INSTRUCTIONS  Do the following to reduce the risk of infection. Antibiotic medicines cannot prevent infections. Limit the number of bacteria entering your bladder  Wash your hands for 2 minutes with soapy water before and after handling the catheter.  Wash your bottom and the entire catheter twice daily, as well as after each bowel movement. Wash the tip of the penis or just above the vaginal opening with soap and warm water, rinse, and then wash the rectal area. Always wash from front to back.  When changing from the leg bag to overnight bag or from the overnight bag to leg bag,  thoroughly clean the end of the catheter where it connects to the tubing with an alcohol wipe.  Clean the leg bag and overnight bag daily after use. Replace your drainage bags weekly.  Always keep the tubing and bag below the level of your bladder. This allows your urine to drain properly. Lifting the bag or tubing above the level of your bladder will cause dirty urine to flow back into your bladder. If you must briefly lift the bag higher than your bladder, pinch the catheter or tubing to prevent backflow.  Drink enough water and fluids to keep your urine clear or pale yellow, or as directed by your caregiver. This will flush bacteria out of the bladder. Protect tissues from injury  Attach the catheter to your leg so there is no tension on the catheter. Use adhesive tape or a leg strap. If you are using adhesive tape, remove any sticky residue left behind by the previous tape you used.  Place your leg bag on your lower leg. Fasten the straps securely and comfortably.  Do not remove the catheter yourself unless you have been instructed how to do so. Keep the urinary pathway open  Check throughout the day to be sure your catheter is working and urine is draining freely. Make sure the tubing does not become kinked.  Do not let the drainage bag overfill. SEEK IMMEDIATE MEDICAL CARE IF:   The catheter becomes blocked. Urine is not draining.  Urine is leaking.  You have any pain.  You have a fever. Document Released: 08/31/2005 Document Revised: 08/17/2012 Document Reviewed: 01/30/2010 [facility] Patient Information 2015 [place], [place]. This information is not intended to replace advice given to you by your health care provider. Make sure you discuss any questions you have with your health care provider.

## 2014-06-29 NOTE — H&P (Signed)
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  He is 5 weeks of external beam radiation therapy and now presents for seed boost.  His prostate cancer history is outlined in the note from the multidisciplinary clinic is included below:     Tony [name] is a 73 year old, very healthy gentleman who has a strong family history of prostate cancer with both his father and brother having been diagnosed.  He was found to have an elevated PSA of 13.9 in April 2015 by Dr. Doyle Askew and was seen by Dr. Risa Grill for this reason.  His PSA was repeated and was 15.65.  His DRE was concerning with induration noted along the entire right side of the prostate although with no clear extraprostatic extension noted. He underwent a prostate needle biopsy by Dr. 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   He underwent staging studies on 02/22/14 including a bone scan and CT scan. His bone scan demonstrated some uptake in what was initially read as the right clavicle although on independent review and conference today appears to be at the level of the right scapula.  A prior chest x-ray does demonstrate a lucent lesion in this region that correlates to the area of uptake.  Tony [name] does describe a prior fall from a ladder where he injured his right shoulder area.  Furthermore, there was mention of some mild uptake in the left ischium.  A review of the CT scan, there is no pelvic lymphadenopathy or other evidence of metastatic disease.  At the left ischium, there is a very small sclerotic focus which does not necessarily correlate to the same location on bone scan and is more likely to represent a bone island than metastatic disease.  No correlating lesion is noted to explain the bone scan findings.  No other evidence of metastatic disease on either the bone scan or CT scan is present.  An MRI of the right scapula has been  recommended to further evaluate the lesion seen there.    TNM stage: cT3a N0 Mx PSA: 15.65 Gleason score: 4+5=9 Biopsy (02/08/14): 12/12 cores positive    Left: L lateral apex (80%, 3+4=7). L apex (30%, 4+5=9), L lateral mid (20%, 3+3=6), L mid (70%, 4+4=8, PNI), L lateral base (89%, 3+3=6), L base (50%, 4+3=7, PNI)    Right: R apex (95%, 4+4=8, PNI, EPE), R lateral apex (60%, 4+5=9, PNI), R mid (90%, 4+4=8), R lateral mid (95%, 4+4=8, PNI, EPE), R base (95%, 4+5=9), R lateral base (90%, 4+5=9) Prostate volume: 54 cc  Nomogram OC disease: 3% EPE: 96% SVI: 61% LNI: 63% PFS (surgery): 8%, 4%   Urinary function: He has minimal baseline voiding symptoms.  IPSS is 1. Erectile function: SHIM score is 14.   Past Medical History  1. History of hypertension (V12.59)  Surgical History  1. Denied: History Of Prior Surgery  Allergies  1. Penicillins  Family History  1. Family history of Alzheimer's dementia : Mother, Father  2. Family history of arthritis (V17.7) : Sister  3. Family history of prostate cancer (L93.57) : Brother  Social History   Alcohol use (V49.89)   Current every day smoker (305.1)   Married   Retired  Review of Systems AU Complete-Male: Genitourinary, constitutional, skin, eye, otolaryngeal, hematologic/lymphatic, cardiovascular, pulmonary, endocrine, musculoskeletal, gastrointestinal, neurological and psychiatric system(s) were reviewed and pertinent findings if present are noted.  ENT: sinus problems.  Physical Exam Constitutional: Well nourished and well developed . No acute distress.   ENT:. The ears and nose are normal in appearance.  Neck: The appearance of the neck is normal and no neck mass is present.  Pulmonary: No respiratory distress and normal respiratory rhythm and effort.  Cardiovascular: Heart rate and rhythm are normal . No peripheral edema.  Abdomen: The abdomen is soft and nontender. No masses are palpated. No CVA tenderness. No  hernias are palpable. No hepatosplenomegaly noted.  Rectal: Rectal exam demonstrates normal sphincter tone, no tenderness and no masses. Estimated prostate size is 1+. The prostate has no nodularity, is indurated involving the entire right lobe  of the prostate which appears to be confined within the prostate capsule and is not tender. The left seminal vesicle is nonpalpable. The right seminal vesicle is nonpalpable. The perineum is normal on inspection.  Genitourinary: Examination of the penis demonstrates no discharge, no masses, no lesions and a normal meatus. The scrotum is without lesions. Examination of the left scrotum demostrates a varicocele. The right epididymis is found to have a spermatocele, but palpably normal and non-tender. The left epididymis is found to have a spermatocele, but palpably normal and non-tender. The right testis is non-tender and without masses. The left testis is non-tender and without masses.  Lymphatics: The femoral and inguinal nodes are not enlarged or tender.  Skin: Normal skin turgor, no visible rash and no visible skin lesions.  Neuro/Psych:. Mood and affect are appropriate.        Assessment  1. Prostate cancer (185)  Discussion/Summary  1.  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   The patient was counseled about the natural history of prostate cancer and the standard treatment options that are available for prostate cancer. It was explained to him how his age and life expectancy, clinical stage, Gleason score, and PSA affect his prognosis, the decision to proceed with additional staging studies, as well as how that information influences recommended treatment strategies. We discussed the roles for active surveillance, radiation therapy, surgical therapy, androgen deprivation, as well as ablative therapy options for the treatment of prostate cancer as appropriate to his  individual cancer situation. We discussed the risks and benefits of these options with regard to their impact on cancer control and also in terms of potential adverse events, complications, and impact on quiality of life particularly related to urinary, bowel, and sexual function. The patient was encouraged to ask questions throughout the discussion today and all questions were answered to his stated satisfaction. In addition, the patient was provided with and/or directed to appropriate resources and literature for further education about prostate cancer and treatment options.   We discussed the high risk and locally advanced nature of his disease and the need to proceed with an MRI of the scapula to absolutely rule out the risk of metastatic disease.  Assuming that he does not have metastatic disease, he understands the recommendation to proceed with aggressive therapy.  We did discuss options for therapy including surgical resection of the prostate, radiation therapy, and systemic therapies.  Considering the high volume and high risk nature of his disease which appears to be locally advanced and taking into consideration his life expectancy and age, the consensus recommendation of the multidisciplinary group was to proceed with radiation therapy in combination with long-term (at least 24 months) androgen deprivation therapy.  We discussed the option of a clinical  trial as well.  Considering his minimal symptoms, Dr. Tammi Klippel has offered the patient 5 weeks of IMRT followed by a radiation seed implant boost of the prostate.  He may need to undergo a CT arch study considering his prostate size.  I answered numerous questions about treatment options as well as the potential risks and side effects as they relate to his quality of life.  He and his family asked numerous appropriate questions and felt comfortable proceeding with the above plan.  I will plan to contact Dr. Risa Grill to inform them of Tony [name]'s  decision.

## 2014-06-29 NOTE — Anesthesia Preprocedure Evaluation (Addendum)
Anesthesia Evaluation  Patient identified by MRN, date of birth, ID band Patient awake    Reviewed: Allergy & Precautions, H&P , NPO status , Patient's Chart, lab work & pertinent test results  Airway Mallampati: II TM Distance: >3 FB Neck ROM: Full    Dental no notable dental hx.    Pulmonary Current Smoker,  breath sounds clear to auscultation  Pulmonary exam normal       Cardiovascular hypertension, Rhythm:Regular Rate:Normal  NO chest pain or SOB  Untreated HTN   Neuro/Psych negative neurological ROS  negative psych ROS   GI/Hepatic negative GI ROS, Neg liver ROS,   Endo/Other  negative endocrine ROS  Renal/GU negative Renal ROS  negative genitourinary   Musculoskeletal negative musculoskeletal ROS (+)   Abdominal   Peds negative pediatric ROS (+)  Hematology negative hematology ROS (+)   Anesthesia Other Findings   Reproductive/Obstetrics negative OB ROS                         Anesthesia Physical Anesthesia Plan  ASA: III  Anesthesia Plan: General   Post-op Pain Management:    Induction: Intravenous  Airway Management Planned: LMA  Additional Equipment:   Intra-op Plan:   Post-operative Plan:   Informed Consent: I have reviewed the patients History and Physical, chart, labs and discussed the procedure including the risks, benefits and alternatives for the proposed anesthesia with the patient or authorized representative who has indicated his/her understanding and acceptance.   Dental advisory given  Plan Discussed with: CRNA and Surgeon  Anesthesia Plan Comments:        Anesthesia Quick Evaluation

## 2014-06-30 NOTE — Procedures (Addendum)
  Radiation Oncology         [phone]) [id number] ________________________________  Name: Tony [name] MRN: [id number]  Date:   06/29/14  DOB: 11-23-1940       Prostate Seed Implant  AJ:GOTLXB, [name], MD  No ref. provider found  DIAGNOSIS: 73 y.o. gentleman with stage T2b (DRE) vs. T3a (core biopsies) adenocarcinoma of the prostate with a Gleason's score of 4+5 and a PSA=15.65     ICD-10  C61  PROCEDURE: Insertion of radioactive I-125 seeds into the prostate gland.  RADIATION DOSE: 110 Gy, boost therapy.  TECHNIQUE: Tony [name] was brought to the operating room with the urologist. He was placed in the dorsolithotomy position. He was catheterized and a rectal tube was inserted. The perineum was shaved, prepped and draped. The ultrasound probe was then introduced into the rectum to see the prostate gland.  TREATMENT DEVICE: A needle grid was attached to the ultrasound probe stand and anchor needles were placed.  3D PLANNING: The prostate was imaged in 3D using a sagittal sweep of the prostate probe. These images were transferred to the planning computer. There, the prostate, urethra and rectum were defined on each axial reconstructed image. Then, the software created an optimized 3D plan and a few seed positions were adjusted. The quality of the plan was reviewed using [facility] information for the target and the following two organs at risk:  Urethra and Rectum.  Then the accepted plan was uploaded to the seed Selectron afterloading unit.  PROSTATE VOLUME STUDY:  Using transrectal ultrasound the volume of the prostate was verified.  SPECIAL TREATMENT PROCEDURE/SUPERVISION AND HANDLING: The Nucletron FIRST system was used to place the needles under sagittal guidance. A total of 26 needles were used to deposit 65 seeds in the prostate gland. The individual seed activity was 0.305 mCi for a total implant activity of 19.825 mCi.  COMPLEX SIMULATION: At the end of the procedure, an anterior  radiograph of the pelvis was obtained to document seed positioning and count. Cystoscopy was performed to check the urethra and bladder.  MICRODOSIMETRY: At the end of the procedure, the patient was emitting 0.25 mrem/hr at 1 meter. Accordingly, he was considered safe for hospital discharge.  PLAN: The patient will return to the radiation oncology clinic for post implant CT dosimetry in three weeks.   ________________________________  Tony [name], M.D.

## 2014-07-02 ENCOUNTER — Encounter (HOSPITAL_BASED_OUTPATIENT_CLINIC_OR_DEPARTMENT_OTHER): Payer: Self-pay | Admitting: Urology

## 2014-07-05 ENCOUNTER — Ambulatory Visit: Payer: Medicare HMO | Admitting: Radiation Oncology

## 2014-07-18 ENCOUNTER — Telehealth: Payer: Self-pay | Admitting: *Deleted

## 2014-07-18 NOTE — Telephone Encounter (Signed)
Called patient to remind of appts. For 07-19-14, spoke with patient's wife and they are aware of these appts.

## 2014-07-19 ENCOUNTER — Ambulatory Visit
Admit: 2014-07-19 | Discharge: 2014-07-19 | Disposition: A | Discharge status: Home / Self Care | Payer: Medicare HMO | Attending: Radiation Oncology | Admitting: Radiation Oncology

## 2014-07-19 ENCOUNTER — Ambulatory Visit
Admit: 2014-07-19 | Discharge: 2014-07-19 | Disposition: A | Discharge status: Home / Self Care | Payer: Commercial Managed Care - HMO | Attending: Radiation Oncology | Admitting: Radiation Oncology

## 2014-07-19 ENCOUNTER — Encounter: Payer: Self-pay | Admitting: Radiation Oncology

## 2014-07-19 VITALS — BP 147/96 | HR 83 | Temp 98.2°F | Resp 24 | Wt 206.8 lb

## 2014-07-19 DIAGNOSIS — Z510 Encounter for antineoplastic radiation therapy: Secondary | ICD-10-CM | POA: Insufficient documentation

## 2014-07-19 DIAGNOSIS — F419 Anxiety disorder, unspecified: Secondary | ICD-10-CM | POA: Diagnosis not present

## 2014-07-19 DIAGNOSIS — C61 Malignant neoplasm of prostate: Secondary | ICD-10-CM | POA: Diagnosis not present

## 2014-07-19 MED ORDER — LORAZEPAM 1 MG PO TABS: 1.0000 mg | ORAL_TABLET | Freq: Three times a day (TID) | ORAL | Status: DC | PRN

## 2014-07-19 NOTE — Progress Notes (Signed)
Radiation Oncology         [id number]) [id number] ________________________________  Name: Tony [name]. Bong MRN: [id number]  Date: 07/19/2014  DOB: [birth year]  Follow-Up Visit Note  CC: [name], MD  [name], MD  Diagnosis:   73 y.o. gentleman with stage T2b (DRE) vs. T3a (core biopsies) adenocarcinoma of the prostate with a Gleason's score of 4+5 and a PSA=15.65    ICD-9-CM ICD-10-CM   1. Malignant neoplasm of prostate 185 C61     Interval Since Last Radiation:  3  weeks  Narrative:  The patient returns today for routine follow-up.  He is complaining of increased urinary frequency and urinary hesitation symptoms. He filled out a questionnaire regarding urinary function today providing and overall IPSS score of 16 characterizing his symptoms as moderate.  He denies any bowel symptoms.  ALLERGIES:  is allergic to penicillins.  Meds: Current Outpatient Prescriptions  Medication Sig Dispense Refill  . ciprofloxacin (CIPRO) 500 MG tablet Take 1 tablet (500 mg total) by mouth 2 (two) times daily. 10 tablet 0  . fluticasone (FLONASE) 50 MCG/ACT nasal spray Place 1 spray into both nostrils daily as needed for allergies or rhinitis.    [name] HYDROcodone-acetaminophen (NORCO/VICODIN) 5-325 MG per tablet Take 1-2 tablets by mouth every 6 (six) hours as needed. 20 tablet 0  . LORazepam (ATIVAN) 1 MG tablet Take 1 tablet (1 mg total) by mouth every 8 (eight) hours as needed for anxiety. 30 tablet 0  . Multiple Vitamin (MULTIVITAMIN WITH MINERALS) TABS tablet Take 1 tablet by mouth daily.    . naproxen sodium (ANAPROX) 220 MG tablet Take 220 mg by mouth 2 (two) times daily as needed (for soreness).      No current facility-administered medications for this encounter.    Physical Findings: The patient is in no acute distress. Patient is alert and oriented.  weight is 206 lb 12.8 oz (93.804 kg). His oral temperature is 98.2 F (36.8 C). His blood pressure is 147/96 and his pulse is 83. His  respiration is 24 and oxygen saturation is 98%. .  No significant changes.  Lab Findings: Lab Results  Component Value Date   WBC 6.6 06/22/2014   HGB 16.6 06/22/2014   HCT 47.4 06/22/2014   MCV 92.6 06/22/2014   PLT 173 06/22/2014    Radiographic Findings:  Patient underwent CT imaging in our clinic for post implant dosimetry. The CT appears to demonstrate an adequate distribution of radioactive seeds throughout the prostate gland. There no seeds in her near the rectum. I suspect the final radiation plan and dosimetry will show appropriate coverage of the prostate gland.   Impression: The patient is recovering from the effects of radiation. His urinary symptoms should gradually improve over the next 4-6 months. We talked about this today. He is encouraged by his improvement already and is otherwise please with his outcome.   Plan: Today, I spent time talking to the patient about his prostate seed implant and resolving urinary symptoms. We also talked about long-term follow-up for prostate cancer following seed implant. He understands that ongoing PSA determinations and digital rectal exams will help perform surveillance to rule out disease recurrence. He understands what to expect with his PSA measures. Patient was also educated today about some of the long-term effects from radiation including a small risk for rectal bleeding and possibly erectile dysfunction. We talked about some of the general management approaches to these potential complications. However, I did encourage the patient to contact our office  or return at any point if he has questions or concerns related to his previous radiation and prostate cancer.  _____________________________________  Sheral Apley. Tammi Klippel, M.D.

## 2014-07-19 NOTE — Progress Notes (Signed)
  Radiation Oncology         [phone]) [phone] ________________________________  Name: Tony [name]. Tiede MRN: [id number]  Date: 07/19/2014  DOB: January 05, 1941  COMPLEX SIMULATION NOTE  NARRATIVE:  The patient was brought to the [facility] today following prostate seed implantation approximately one month ago.  Identity was confirmed.  All relevant records and images related to the planned course of therapy were reviewed.  Then, the patient was set-up supine.  CT images were obtained.  The CT images were loaded into the planning software.  Then the prostate and rectum were contoured.  Treatment planning then occurred.  The implanted iodine 125 seeds were identified by the physics staff for projection of radiation distribution  I have requested : 3D Simulation  I have requested a DVH of the following structures: Prostate and rectum.    ________________________________  Sheral Apley Tammi Klippel, M.D.

## 2014-07-19 NOTE — Progress Notes (Signed)
He is currently in no pain. Pt complains of, Loss of Sleep. Reports he misplaced his Ativan and hasn't been taking it. Reports urinary frequency Urgency.  Pt states they urinate 2 - 3 times per night.  Pt reports, a bowel movement every day. The patient eats a regular, healthy diet.

## 2014-09-06 DIAGNOSIS — Z510 Encounter for antineoplastic radiation therapy: Secondary | ICD-10-CM | POA: Diagnosis not present

## 2014-09-10 ENCOUNTER — Encounter: Payer: Self-pay | Admitting: Radiation Oncology

## 2014-09-21 ENCOUNTER — Telehealth: Payer: Self-pay | Admitting: Radiation Oncology

## 2014-09-21 ENCOUNTER — Other Ambulatory Visit: Payer: Self-pay | Admitting: Radiation Oncology

## 2014-09-21 NOTE — Telephone Encounter (Signed)
Per Dr. Johny Shears order called in Ativan 1 mg to Waunita Schooner at [facility] in [place]. No refill. Called [name], patient' wife, explained script was called in and should be ready for pick up in one hour. Also, gently encouraged her to have her husband follow up with his PCP reference his anxiety and allow them to refill next ativan script. She verbalized understanding and expressed appreciation for the call.

## 2014-09-25 ENCOUNTER — Telehealth: Payer: Self-pay | Admitting: Radiation Oncology

## 2014-09-25 NOTE — Telephone Encounter (Signed)
Dr. Tammi Klippel filled [place] script on 09/22/2013. No need to refill at this time.

## 2014-10-24 ENCOUNTER — Encounter: Payer: Self-pay | Admitting: Radiation Oncology

## 2014-12-07 NOTE — Progress Notes (Signed)
  Radiation Oncology         [phone]) [phone] ________________________________  Name: Tony [name] MRN: [id number]  Date: 09/10/2014  DOB: 1941/03/20  3D Planning Note   Prostate Brachytherapy Post-Implant Dosimetry  Diagnosis:  74 y.o. 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 He underwent CT scan complex simulation to delineate the three-dimensional structures of the pelvis and demonstrate the radiation distribution.  Since that time, the seed localization, and complex isodose planning with dose volume histograms have now been completed.  Results:   Prostate Coverage - The dose of radiation delivered to the 90% or more of the prostate gland (D90) was 115.28% of the prescription dose. This exceeds our goal of greater than 90%. Rectal Sparing - The volume of rectal tissue receiving the prescription dose or higher was 0.11 cc. This falls under our thresholds tolerance of 1.0 cc.  Impression: The prostate seed implant appears to show adequate target coverage and appropriate rectal sparing.  Plan:  The patient will continue to follow with urology for ongoing PSA determinations. I would anticipate a high likelihood for local tumor control with minimal risk for rectal morbidity.  ________________________________  Sheral Apley Tammi Klippel, M.D.

## 2015-01-03 LAB — BASIC METABOLIC PANEL
BUN: 24 mg/dL — AB (ref 4–21)
Creatinine: 0.9 mg/dL (ref 0.6–1.3)
Glucose: 102 mg/dL
Potassium: 4.5 mmol/L (ref 3.4–5.3)
Sodium: 139 mmol/L (ref 137–147)

## 2016-01-10 LAB — BASIC METABOLIC PANEL
BUN: 9 mg/dL (ref 4–21)
Creatinine: 0.9 mg/dL (ref 0.6–1.3)
Glucose: 105 mg/dL
POTASSIUM: 4.6 mmol/L (ref 3.4–5.3)
SODIUM: 138 mmol/L (ref 137–147)

## 2016-01-10 LAB — LIPID PANEL
CHOLESTEROL: 179 mg/dL (ref 0–200)
HDL: 52 mg/dL (ref 35–70)
TRIGLYCERIDES: 60 mg/dL (ref 40–160)

## 2016-02-07 ENCOUNTER — Encounter: Payer: Self-pay | Admitting: Oncology

## 2016-02-13 ENCOUNTER — Ambulatory Visit: Payer: Medicare HMO | Admitting: Oncology

## 2016-02-19 ENCOUNTER — Telehealth: Payer: Self-pay | Admitting: Oncology

## 2016-02-19 ENCOUNTER — Ambulatory Visit (HOSPITAL_BASED_OUTPATIENT_CLINIC_OR_DEPARTMENT_OTHER): Payer: Medicare Other | Admitting: Oncology

## 2016-02-19 VITALS — BP 135/56 | HR 66 | Temp 98.0°F | Resp 18 | Ht 65.0 in | Wt 184.0 lb

## 2016-02-19 DIAGNOSIS — C61 Malignant neoplasm of prostate: Secondary | ICD-10-CM

## 2016-02-19 DIAGNOSIS — E291 Testicular hypofunction: Secondary | ICD-10-CM | POA: Diagnosis not present

## 2016-02-19 NOTE — Telephone Encounter (Signed)
per pof to sch pt appt-gave pt copy of avs °

## 2016-02-19 NOTE — Telephone Encounter (Signed)
per pof to sch pt appt-*[facility] will call to sch scan-*gave pt copyof avs

## 2016-02-19 NOTE — Progress Notes (Signed)
Hematology and Oncology Follow Up Visit  Tony [name] [id number] 05/08/1941 75 y.o. 02/19/2016 1:34 PM [name], Tony [name], MDMeyers, Tony Main, MD   Principle Diagnosis: 75 year old gentleman with prostate cancer diagnosed in May 2015. His PSA was 15.7, Gleason score is 4+5 = 9 with 12 out of 12 cores involved. His initial clinical staging is T3a N0. His metastatic workup was on revealing.   Prior Therapy: He is status post radiation therapy for external beam radiation completed in September 2015 for a total of 45 gray. He also status post seed implant brachytherapy boost completed in October 2015. He received total of 1 year of androgen deprivation 22,015 and 2016. His PSA nadir was to 0.47 in November 2016. His PSA was 4.08 in May 2017.  Current therapy: Under evaluation for salvage therapy.  Interim History: [name] presents today for a follow-up visit. He will pleasant gentleman I saw in consultation at the time of his diagnosis of prostate cancer. He reports he tolerated androgen deprivation poorly and have discontinued it in the last year. He is a PSA as mentioned have increased up to 4.08 with testosterone level of 350. Despite his rising his testosterone, he continues to report symptoms of fatigue and tiredness. His hot flashes have resolved at this time. He does not report any voiding symptoms. He is not report any new pathological fractures or bone pain. He does have chronic back pain which is unchanged. He is able to maintain his activity levels and performs activities outside the house. [name] He does not report any headaches blurred vision or double vision. Does not report any alteration of mental status psychiatric issues or depression. Has not reported any syncope or seizure. He does not report any chest pain shortness of breath cough or hemoptysis. Does not report any palpitation orthopnea or PND. Have not had any cardiac issues or congestive heart failure per his report. He does not  report any nausea, vomiting, abdominal pain, or satiety or change in his bowel habits. He does not report any musca skeletal complaints. Is not reporting any bony pain joint pain or joint stiffness. He does not report any skin rashes or lymphadenopathy. Rest of his review of systems unremarkable.   Medications: I have reviewed the patient's current medications.  Current Outpatient Prescriptions  Medication Sig Dispense Refill  . fluticasone (FLONASE) 50 MCG/ACT nasal spray Place 1 spray into both nostrils daily as needed for allergies or rhinitis.    [name] LORazepam (ATIVAN) 1 MG tablet TAKE 1 TABLET BY MOUTH EVERY 8 HOURS AS NEEDED FOR ANXIETY 30 tablet 0  . Multiple Vitamin (MULTIVITAMIN WITH MINERALS) TABS tablet Take 1 tablet by mouth daily.    . naproxen sodium (ANAPROX) 220 MG tablet Take 220 mg by mouth 2 (two) times daily as needed (for soreness).     . traZODone (DESYREL) 50 MG tablet Take 50 mg by mouth at bedtime as needed. As needed  2   No current facility-administered medications for this visit.     Allergies:  Allergies  Allergen Reactions  . Penicillins Rash    Past Medical History, Surgical history, Social history, and Family History were reviewed and updated.   Physical Exam: Blood pressure 135/56, pulse 66, temperature 98 F (36.7 C), temperature source Oral, resp. rate 18, height 5\' 5"  (1.651 m), weight 184 lb (83.462 kg), SpO2 94 %. ECOG: 1 General appearance: alert and cooperative Head: Normocephalic, without obvious abnormality Neck: no adenopathy Lymph nodes: Cervical, supraclavicular, and axillary nodes normal. Heart:regular rate  and rhythm, S1, S2 normal, no murmur, click, rub or gallop Lung:chest clear, no wheezing, rales, normal symmetric air entry Abdomin: soft, non-tender, without masses or organomegaly EXT:no erythema, induration, or nodules   Lab Results: Lab Results  Component Value Date   WBC 6.6 06/22/2014   HGB 16.6 06/22/2014   HCT 47.4  06/22/2014   MCV 92.6 06/22/2014   PLT 173 06/22/2014     Chemistry      Component Value Date/Time   NA 139 06/22/2014 0945   K 4.2 06/22/2014 0945   CL 103 06/22/2014 0945   CO2 23 06/22/2014 0945   BUN 12 06/22/2014 0945   CREATININE 0.78 06/22/2014 0945      Component Value Date/Time   CALCIUM 9.3 06/22/2014 0945   ALKPHOS 72 06/22/2014 0945   AST 31 06/22/2014 0945   ALT 29 06/22/2014 0945   BILITOT 0.6 06/22/2014 0945       Impression and Plan:   75 year old gentleman with the following issues:  1. Prostate cancer diagnosed in May 2015. He had a PSA of 15.6 and a Gleason score 4+5 = 9. He received local therapy with external beam radiation as well as seed implants boost completed in October 2015. His PSA nadir was to 0.17 in July 2016. His most recent PSA was up to 4.08 with testosterone of 350 after discontinuing androgen deprivation.   The natural course of locally advanced prostate cancer and potentially metastatic hormone sensitive prostate cancer was reviewed today. Given the rise in his PSA and his high risk prostate cancer features the next step is to complete his staging workup. I would be obtaining CT scan and a bone scan as well as a repeat in his PSA. After continuing the step treatment options will be reviewed. These options would include androgen deprivation in the form of [place] agonists or antagonists or surgical castration.  The addition of Zytiga would be also an option in addition to androgen deprivation based on recent evidence even in the setting of N0 M0 disease. There is substantial evidence to support using Zytiga in addition to androgen deprivation and the advanced prostate cancer setting. This will be discussed further after his imaging studies are complete.  2. Androgen depravation: I feel it is critical for him to restart his androgen deprivation therapy. Complications associated with that would include fatigue, tiredness and hot flashes. He is willing  to be aware that given the seriousness of his cancer. Alternatively, surgical castration might be appealing to him and I would Dr. Risa Grill to potentially discuss with him that option if he feels he is a candidate.  3. Follow-up: Will be in the next few weeks to discuss the results of his staging workup.   Zola Button, MD 6/7/20171:34 PM

## 2016-02-26 ENCOUNTER — Ambulatory Visit (HOSPITAL_COMMUNITY)
Admission: RE | Admit: 2016-02-26 | Discharge: 2016-02-26 | Disposition: A | Discharge status: Home / Self Care | Payer: Medicare Other | Source: Ambulatory Visit | Attending: Oncology | Admitting: Oncology

## 2016-02-26 ENCOUNTER — Encounter (HOSPITAL_COMMUNITY): Payer: Self-pay

## 2016-02-26 ENCOUNTER — Encounter (HOSPITAL_COMMUNITY)
Admission: RE | Admit: 2016-02-26 | Discharge: 2016-02-26 | Disposition: A | Discharge status: Home / Self Care | Payer: Medicare Other | Source: Ambulatory Visit | Attending: Oncology | Admitting: Oncology

## 2016-02-26 ENCOUNTER — Other Ambulatory Visit (HOSPITAL_BASED_OUTPATIENT_CLINIC_OR_DEPARTMENT_OTHER): Payer: Medicare Other

## 2016-02-26 DIAGNOSIS — C61 Malignant neoplasm of prostate: Secondary | ICD-10-CM | POA: Diagnosis not present

## 2016-02-26 DIAGNOSIS — Z9889 Other specified postprocedural states: Secondary | ICD-10-CM | POA: Insufficient documentation

## 2016-02-26 DIAGNOSIS — I700 Atherosclerosis of aorta: Secondary | ICD-10-CM | POA: Diagnosis not present

## 2016-02-26 DIAGNOSIS — I714 Abdominal aortic aneurysm, without rupture: Secondary | ICD-10-CM | POA: Insufficient documentation

## 2016-02-26 DIAGNOSIS — I2510 Atherosclerotic heart disease of native coronary artery without angina pectoris: Secondary | ICD-10-CM | POA: Insufficient documentation

## 2016-02-26 DIAGNOSIS — R938 Abnormal findings on diagnostic imaging of other specified body structures: Secondary | ICD-10-CM | POA: Insufficient documentation

## 2016-02-26 LAB — CBC WITH DIFFERENTIAL/PLATELET
BASO%: 0.2 % (ref 0.0–2.0)
Basophils Absolute: 0 10*3/uL (ref 0.0–0.1)
EOS%: 0 % (ref 0.0–7.0)
Eosinophils Absolute: 0 10*3/uL (ref 0.0–0.5)
HCT: 51.6 % — ABNORMAL HIGH (ref 38.4–49.9)
HGB: 18 g/dL — ABNORMAL HIGH (ref 13.0–17.1)
LYMPH%: 9.5 % — AB (ref 14.0–49.0)
MCH: 31.9 pg (ref 27.2–33.4)
MCHC: 34.9 g/dL (ref 32.0–36.0)
MCV: 91.3 fL (ref 79.3–98.0)
MONO#: 0.5 10*3/uL (ref 0.1–0.9)
MONO%: 6 % (ref 0.0–14.0)
NEUT%: 84.3 % — ABNORMAL HIGH (ref 39.0–75.0)
NEUTROS ABS: 7.2 10*3/uL — AB (ref 1.5–6.5)
PLATELETS: 166 10*3/uL (ref 140–400)
RBC: 5.65 10*6/uL (ref 4.20–5.82)
RDW: 14 % (ref 11.0–14.6)
WBC: 8.5 10*3/uL (ref 4.0–10.3)
lymph#: 0.8 10*3/uL — ABNORMAL LOW (ref 0.9–3.3)

## 2016-02-26 LAB — COMPREHENSIVE METABOLIC PANEL
ALT: 10 U/L (ref 0–55)
ANION GAP: 11 meq/L (ref 3–11)
AST: 15 U/L (ref 5–34)
Albumin: 3.8 g/dL (ref 3.5–5.0)
Alkaline Phosphatase: 87 U/L (ref 40–150)
BILIRUBIN TOTAL: 0.7 mg/dL (ref 0.20–1.20)
BUN: 13 mg/dL (ref 7.0–26.0)
CHLORIDE: 107 meq/L (ref 98–109)
CO2: 20 meq/L — AB (ref 22–29)
CREATININE: 0.9 mg/dL (ref 0.7–1.3)
Calcium: 9.1 mg/dL (ref 8.4–10.4)
EGFR: 80 mL/min/{1.73_m2} — ABNORMAL LOW (ref >90)
Glucose: 125 mg/dl (ref 70–140)
Potassium: 3.8 mEq/L (ref 3.5–5.1)
Sodium: 138 mEq/L (ref 136–145)
TOTAL PROTEIN: 7.1 g/dL (ref 6.4–8.3)

## 2016-02-26 MED ORDER — TECHNETIUM TC 99M MEDRONATE IV KIT
25.0000 | PACK | Freq: Once | INTRAVENOUS | Status: AC | PRN
  Administered 2016-02-26: 25.8 via INTRAVENOUS

## 2016-02-26 MED ORDER — IOPAMIDOL (ISOVUE-300) INJECTION 61%
100.0000 mL | Freq: Once | INTRAVENOUS | Status: AC | PRN
  Administered 2016-02-26: 100 mL via INTRAVENOUS

## 2016-02-27 LAB — TESTOSTERONE: TESTOSTERONE: 697 ng/dL (ref 348–1197)

## 2016-02-27 LAB — PSA: PROSTATE SPECIFIC AG, SERUM: 6.2 ng/mL — AB (ref 0.0–4.0)

## 2016-03-02 ENCOUNTER — Ambulatory Visit (HOSPITAL_BASED_OUTPATIENT_CLINIC_OR_DEPARTMENT_OTHER): Payer: Medicare Other | Admitting: Oncology

## 2016-03-02 ENCOUNTER — Telehealth: Payer: Self-pay | Admitting: Oncology

## 2016-03-02 ENCOUNTER — Ambulatory Visit (HOSPITAL_BASED_OUTPATIENT_CLINIC_OR_DEPARTMENT_OTHER): Payer: Medicare Other

## 2016-03-02 ENCOUNTER — Other Ambulatory Visit: Payer: Medicare Other

## 2016-03-02 VITALS — BP 151/86 | HR 82 | Temp 98.0°F | Resp 18 | Ht 65.0 in | Wt 186.7 lb

## 2016-03-02 DIAGNOSIS — Z5111 Encounter for antineoplastic chemotherapy: Secondary | ICD-10-CM

## 2016-03-02 DIAGNOSIS — E291 Testicular hypofunction: Secondary | ICD-10-CM

## 2016-03-02 DIAGNOSIS — C61 Malignant neoplasm of prostate: Secondary | ICD-10-CM | POA: Diagnosis not present

## 2016-03-02 DIAGNOSIS — D751 Secondary polycythemia: Secondary | ICD-10-CM

## 2016-03-02 MED ORDER — LEUPROLIDE ACETATE (4 MONTH) 30 MG IM KIT
22.5000 mg | PACK | Freq: Once | INTRAMUSCULAR | Status: AC
  Administered 2016-03-02: 22.5 mg via INTRAMUSCULAR
  Filled 2016-03-02: qty 30

## 2016-03-02 NOTE — Telephone Encounter (Signed)
Gave and printed appt sched and avs for pt for Aug and SEpt °

## 2016-03-02 NOTE — Progress Notes (Signed)
Hematology and Oncology Follow Up Visit  Tony [name] [id number] 19-Mar-1941 75 y.o. 03/02/2016 9:54 AM Olen Pel, [name], MDMeyers, [name], MD   Principle Diagnosis: 75 year old gentleman with prostate cancer diagnosed in May 2015. His PSA was 15.7, Gleason score is 4+5 = 9 with 12 out of 12 cores involved. His initial clinical staging is T3a N0. His metastatic workup was on revealing.   Prior Therapy: He is status post radiation therapy for external beam radiation completed in September 2015 for a total of 45 gray. He also status post seed implant brachytherapy boost completed in October 2015. He received total of 1 year of androgen deprivation 22,015 and 2016. His PSA nadir was to 0.47 in November 2016. His PSA was 4.08 in May 2017.  Current therapy: Androgen deprivation in the form of Lupron 22.5 milligrams daily to be resumed on 03/02/2016.  Interim History: Mr. Mcnabb presents today for a follow-up visit. Since the last visit, he reports no recent complaints. His hot flashes have resolved at this time. He does not report any voiding symptoms. He is not report any new pathological fractures or bone pain. He does have chronic back pain which is unchanged. His quality of life and are not dramatically changed and he continues to smoke. [name] He does not report any headaches blurred vision or double vision. He does not report any chest pain shortness of breath cough or hemoptysis. Does not report any palpitation orthopnea or PND. Have not had any cardiac issues or congestive heart failure per his report. He does not report any nausea, vomiting, abdominal pain, or satiety or change in his bowel habits. He does not report any musca skeletal complaints. Is not reporting any bony pain joint pain or joint stiffness. He does not report any skin rashes or lymphadenopathy. Rest of his review of systems unremarkable.   Medications: I have reviewed the patient's current medications.  Current Outpatient  Prescriptions  Medication Sig Dispense Refill  . fluticasone (FLONASE) 50 MCG/ACT nasal spray Place 1 spray into both nostrils daily as needed for allergies or rhinitis.    [name] LORazepam (ATIVAN) 1 MG tablet TAKE 1 TABLET BY MOUTH EVERY 8 HOURS AS NEEDED FOR ANXIETY 30 tablet 0  . Multiple Vitamin (MULTIVITAMIN WITH MINERALS) TABS tablet Take 1 tablet by mouth daily.    . naproxen sodium (ANAPROX) 220 MG tablet Take 220 mg by mouth 2 (two) times daily as needed (for soreness).     . traZODone (DESYREL) 50 MG tablet Take 50 mg by mouth at bedtime as needed. As needed  2   No current facility-administered medications for this visit.     Allergies:  Allergies  Allergen Reactions  . Penicillins Rash    Past Medical History, Surgical history, Social history, and Family History were reviewed and updated.   Physical Exam: Blood pressure 151/86, pulse 82, temperature 98 F (36.7 C), temperature source Oral, resp. rate 18, height 5\' 5"  (1.651 m), weight 186 lb 11.2 oz (84.687 kg), SpO2 96 %. ECOG: 1 General appearance: alert and cooperative appeared without distress. Head: Normocephalic, without obvious abnormality no oral ulcers or lesions. Neck: no adenopathy Lymph nodes: Cervical, supraclavicular, and axillary nodes normal. Heart:regular rate and rhythm, S1, S2 normal, no murmur, click, rub or gallop Lung:chest clear, no wheezing, rales, normal symmetric air entry Abdomin: soft, non-tender, without masses or organomegaly no shifting dullness or ascites. EXT:no erythema, induration, or nodules   Lab Results: Lab Results  Component Value Date   WBC 8.5 02/26/2016  HGB 18.0* 02/26/2016   HCT 51.6* 02/26/2016   MCV 91.3 02/26/2016   PLT 166 02/26/2016     Chemistry      Component Value Date/Time   NA 138 02/26/2016 0805   NA 139 06/22/2014 0945   K 3.8 02/26/2016 0805   K 4.2 06/22/2014 0945   CL 103 06/22/2014 0945   CO2 20* 02/26/2016 0805   CO2 23 06/22/2014 0945   BUN  13.0 02/26/2016 0805   BUN 12 06/22/2014 0945   CREATININE 0.9 02/26/2016 0805   CREATININE 0.78 06/22/2014 0945      Component Value Date/Time   CALCIUM 9.1 02/26/2016 0805   CALCIUM 9.3 06/22/2014 0945   ALKPHOS 87 02/26/2016 0805   ALKPHOS 72 06/22/2014 0945   AST 15 02/26/2016 0805   AST 31 06/22/2014 0945   ALT 10 02/26/2016 0805   ALT 29 06/22/2014 0945   BILITOT 0.70 02/26/2016 0805   BILITOT 0.6 06/22/2014 0945       Results for Eskin, Wiley A. (MRN [id number]) as of 03/02/2016 10:01  Ref. Range 02/26/2016 08:05  PSA Latest Ref Range: 0.0-4.0 ng/mL 6.2 (H)      EXAM: NUCLEAR MEDICINE WHOLE BODY BONE SCAN  TECHNIQUE: Whole body anterior and posterior images were obtained approximately 3 hours after intravenous injection of radiopharmaceutical.  RADIOPHARMACEUTICALS: 25.8 mCi Technetium-67m MDP IV  COMPARISON: 02/22/2014 whole-body bone scan and 03/06/2014 right shoulder MRI.  FINDINGS: Expected radiotracer activity in the kidneys and urinary bladder. Trace contamination at the perineum.  Unchanged oval intense focus of increased radiotracer activity projecting at the level of the distal third right clavicle and superior scapula. This appears unchanged since 2015.  There is a new mild curvilinear focus of uptake at the left cervicothoracic junction. This was faintly suggested in 2015 and likely is degenerative.  Otherwise stable and normal radiotracer activity throughout the axial and visualized appendicular skeleton.  IMPRESSION: 1. No findings specific for metastatic disease to bone. 2. Unchanged small area of intense radiotracer activity at the distal right clavicle versus superior scapula which was investigated in 2015 by MRI. Despite the inconclusive findings on MRI, the stability since that time and lack of new suspicious findings suggests this is benign. 3. Other mild heterogeneous activity in the thorax which  appears degenerative.   CLINICAL DATA: 75 year old male with history of prostate cancer diagnosed in 2015, status post brachytherapy now complete. Elevated PSA levels. Restaging examination.  EXAM: CT ABDOMEN AND PELVIS WITH CONTRAST  TECHNIQUE: Multidetector CT imaging of the abdomen and pelvis was performed using the standard protocol following bolus administration of intravenous contrast.  CONTRAST: 175mL ISOVUE-300 IOPAMIDOL (ISOVUE-300) INJECTION 61%  COMPARISON: CT the abdomen and pelvis 02/22/2014.  FINDINGS: Lower chest: Tiny pulmonary nodules in the periphery of the lateral segment of the right middle lobe (image 12 of series 4) measuring 4 mm or less in size are unchanged compared to prior study 02/22/2014, considered benign. Atherosclerosis, including calcified atherosclerotic plaques in the left circumflex and right coronary arteries.  Hepatobiliary: Sub cm low-attenuation lesions in segments 6 and 7 of the liver are too small to characterize, but are similar to prior study from 2015, presumably cysts. No new suspicious appearing hepatic lesions are noted. No intra or extrahepatic biliary ductal dilatation. Gallbladder is normal in appearance.  Pancreas: No pancreatic mass. No pancreatic ductal dilatation. No pancreatic or peripancreatic fluid or inflammatory changes.  Spleen: Unremarkable.  Adrenals/Urinary Tract: Exophytic 4.1 x 5.5 cm low-attenuation lesion in the lower pole  of the left kidney is compatible with a simple cyst. Focal area of cortical atrophy with dystrophic calcification in the anterior aspect of the interpolar region of the right kidney is similar to prior studies, presumably an area of post infectious/inflammatory scarring. No suspicious renal lesions. No hydroureteronephrosis. Urinary bladder is partially decompressed and demonstrates diffuse wall thickening. Bilateral adrenal glands are normal in  appearance.  Stomach/Bowel: Normal appearance of the stomach. No pathologic dilatation of small bowel or colon. Numerous colonic diverticulae are noted, without surrounding inflammatory changes to suggest an acute diverticulitis at this time. Normal appendix.  Vascular/Lymphatic: Extensive atherosclerosis throughout the abdominal and pelvic vasculature, including mild fusiform aneurysmal dilatation of the infrarenal abdominal aorta which measures up to 3.0 x 3.1 cm. No lymphadenopathy noted in the abdomen or pelvis.  Reproductive: Brachytherapy implants throughout the prostate gland. Seminal vesicles are unremarkable in appearance.  Other: Small right inguinal hernia containing only fat. No significant volume of ascites. No pneumoperitoneum.  Musculoskeletal: There are no aggressive appearing lytic or blastic lesions noted in the visualized portions of the skeleton.  IMPRESSION: 1. Postprocedural changes of brachytherapy in the prostate gland are noted, without definite evidence to suggest metastatic disease in the abdomen or pelvis. 2. Extensive atherosclerosis, including at least 2 vessel coronary artery disease. Assessment for potential risk factor modification, dietary therapy or pharmacologic therapy may be warranted, if clinically indicated. 3. Additional incidental findings, as above.   Impression and Plan:   75 year old gentleman with the following issues:  1. Prostate cancer diagnosed in May 2015. He had a PSA of 15.6 and a Gleason score 4+5 = 9. He received local therapy with external beam radiation as well as seed implants boost completed in October 2015. His PSA nadir was to 0.17 in July 2016. His most recent PSA was up to 4.08 with testosterone of 350 after discontinuing androgen deprivation.   CT scan and bone scan obtained on 02/26/2016 were reviewed today and showed no evidence of advanced disease. His PSA was up to 6.2 and his testosterone 697.  It  appears that he has developed biochemical relapse without any evidence of measurable disease and no evidence of castration resistant disease. Risks and benefits of continuing androgen deprivation therapy were reviewed today and he is willing to proceed. Complications include hot flashes, weight gain, fatigue among others were reviewed and is agreeable to proceed.  The rationale for adding Zytiga to androgen deprivation was also reviewed based on the STAMPEDE trial. Most of the benefit was observed with patient with advanced disease and its reasonable given the side effects he had with androgen deprivation only to add Zytiga at a later date rather than upfront. We will continue to consider that option moving forward.  We have discussed the option of surgical castration as well and he preferred to proceed with Lupron.  2. Androgen depravation: He will receive Lupron 22.5 mg injection every 3 months starting on 03/02/2016.  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 I counseled him about smoking cessation and the importance of that moving forward.  4. Follow-up: Will be in 6 weeks to check his PSA after Lupron restart.   [facility], MD 6/19/20179:54 AM

## 2016-03-02 NOTE — Patient Instructions (Signed)
Leuprolide depot injection What is this medicine? LEUPROLIDE (loo PROE lide) is a man-made protein that acts like a natural hormone in the body. It decreases testosterone in men and decreases estrogen in women. In men, this medicine is used to treat advanced prostate cancer. In women, some forms of this medicine may be used to treat endometriosis, uterine fibroids, or other male hormone-related problems. This medicine may be used for other purposes; ask your health care provider or pharmacist if you have questions. What should I tell my health care provider before I take this medicine? They need to know if you have any of these conditions: -diabetes -heart disease or previous heart attack -high blood pressure -high cholesterol -osteoporosis -pain or difficulty passing urine -spinal cord metastasis -stroke -tobacco smoker -unusual vaginal bleeding (women) -an unusual or allergic reaction to leuprolide, benzyl alcohol, other medicines, foods, dyes, or preservatives -pregnant or trying to get pregnant -breast-feeding How should I use this medicine? This medicine is for injection into a muscle or for injection under the skin. It is given by a health care professional in a hospital or clinic setting. The specific product will determine how it will be given to you. Make sure you understand which product you receive and how often you will receive it. Talk to your pediatrician regarding the use of this medicine in children. Special care may be needed. Overdosage: If you think you have taken too much of this medicine contact a poison control center or emergency room at once. NOTE: This medicine is only for you. Do not share this medicine with others. What if I miss a dose? It is important not to miss a dose. Call your doctor or health care professional if you are unable to keep an appointment. Depot injections: Depot injections are given either once-monthly, every 12 weeks, every 16 weeks, or  every 24 weeks depending on the product you are prescribed. The product you are prescribed will be based on if you are male or male, and your condition. Make sure you understand your product and dosing. What may interact with this medicine? Do not take this medicine with any of the following medications: -chasteberry This medicine may also interact with the following medications: -herbal or dietary supplements, like black cohosh or DHEA -male hormones, like estrogens or progestins and birth control pills, patches, rings, or injections -male hormones, like testosterone This list may not describe all possible interactions. Give your health care provider a list of all the medicines, herbs, non-prescription drugs, or dietary supplements you use. Also tell them if you smoke, drink alcohol, or use illegal drugs. Some items may interact with your medicine. What should I watch for while using this medicine? Visit your doctor or health care professional for regular checks on your progress. During the first weeks of treatment, your symptoms may get worse, but then will improve as you continue your treatment. You may get hot flashes, increased bone pain, increased difficulty passing urine, or an aggravation of nerve symptoms. Discuss these effects with your doctor or health care professional, some of them may improve with continued use of this medicine. Male patients may experience a menstrual cycle or spotting during the first months of therapy with this medicine. If this continues, contact your doctor or health care professional. What side effects may I notice from receiving this medicine? Side effects that you should report to your doctor or health care professional as soon as possible: -allergic reactions like skin rash, itching or hives, swelling of the   face, lips, or tongue -breathing problems -chest pain -depression or memory disorders -pain in your legs or groin -pain at site where injected or  implanted -severe headache -swelling of the feet and legs -visual changes -vomiting Side effects that usually do not require medical attention (report to your doctor or health care professional if they continue or are bothersome): -breast swelling or tenderness -decrease in sex drive or performance -diarrhea -hot flashes -loss of appetite -muscle, joint, or bone pains -nausea -redness or irritation at site where injected or implanted -skin problems or acne This list may not describe all possible side effects. Call your doctor for medical advice about side effects. You may report side effects to FDA at 1-800-FDA-1088. Where should I keep my medicine? This drug is given in a hospital or clinic and will not be stored at home. NOTE: This sheet is a summary. It may not cover all possible information. If you have questions about this medicine, talk to your doctor, pharmacist, or health care provider.    2016, [employer]. (2014-05-25 14:16:23)  

## 2016-03-18 ENCOUNTER — Ambulatory Visit: Payer: Medicare Other

## 2016-03-18 ENCOUNTER — Other Ambulatory Visit: Payer: Medicare Other

## 2016-04-08 ENCOUNTER — Encounter: Payer: Self-pay | Admitting: Family Medicine

## 2016-04-08 ENCOUNTER — Ambulatory Visit (INDEPENDENT_AMBULATORY_CARE_PROVIDER_SITE_OTHER): Payer: Medicare Other | Admitting: Family Medicine

## 2016-04-08 VITALS — BP 146/100 | HR 91 | Temp 97.8°F | Ht 65.0 in | Wt 176.5 lb

## 2016-04-08 DIAGNOSIS — G4700 Insomnia, unspecified: Secondary | ICD-10-CM

## 2016-04-08 DIAGNOSIS — R030 Elevated blood-pressure reading, without diagnosis of hypertension: Secondary | ICD-10-CM

## 2016-04-08 DIAGNOSIS — F419 Anxiety disorder, unspecified: Secondary | ICD-10-CM

## 2016-04-08 DIAGNOSIS — I499 Cardiac arrhythmia, unspecified: Secondary | ICD-10-CM | POA: Diagnosis not present

## 2016-04-08 DIAGNOSIS — I2510 Atherosclerotic heart disease of native coronary artery without angina pectoris: Secondary | ICD-10-CM

## 2016-04-08 DIAGNOSIS — F17200 Nicotine dependence, unspecified, uncomplicated: Secondary | ICD-10-CM

## 2016-04-08 DIAGNOSIS — E7800 Pure hypercholesterolemia, unspecified: Secondary | ICD-10-CM

## 2016-04-08 MED ORDER — LORAZEPAM 1 MG PO TABS: 0.5000 mg | ORAL_TABLET | Freq: Every day | ORAL | 1 refills | Status: DC | PRN

## 2016-04-08 NOTE — Patient Instructions (Addendum)
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 Lorazepam half tablet at the time. Monitor blood pressure at home,goal < 150/90.  Low fat and salt diet. Continue following with the prostate doctor.  Continue trazodone for sleep, consider using extended release melatonin over-the-counter. Good sleep hygiene.  Please be sure medication list is accurate. If a new problem present, please set up appointment sooner than planned today.

## 2016-04-08 NOTE — Progress Notes (Signed)
HPI:   [name] is a 75 y.o. male, who is here today with his wife to establish care with me.  Former PCP: Dr Olen Pel. Last preventive routine visit: 12/2015.    Concerns today: cardiology referral and refill on Lorazepam.  Anxiety: He is currently taking Lorazepam 1 mg daily as needed, according to patient, he was started about 2 years ago. He helps with acute anxiety, he denies side effects. He has not tried other medications. He denies depression.  Anxiety seems to be mildly worse since he was diagnosed with prostate cancer, currently he is following with urologist and oncologist. He denies suicidal thoughts.  Insomnia: He is currently on Trazodone 50 mg at bedtime, which he takes as needed. He denies side effects and it helps with sleep.  Wife is also concerned about "hardening of arteries", apparently it was reported on recent imaging ordered by oncologists (PET scan). She denies any prior history of CAD, he denies any chest pain, dyspnea, diaphoresis, palpitation, or dizziness.  + Tobacco use, trying to quit, he is currently on nicotine grams and has decreased number of cigarettes per day.  History of hyperlipidemia, according to wife, he didn't tolerate statin. He denies claudication and he doesn't take Aspirin.  He denies any history of hypertension, he is BP is elevated today. He checks his blood pressure at home occasionally and reported as "normal". HTN is listed on his problem list. According to wife, he has been evaluated by cardiologist about 10 years ago because palpitations, which he attributed to anxiety. According to patient, everything was "fine."   02/26/16: Bone scan: 1.  No findings specific for metastatic disease to bone. 2. Unchanged small area of intense radiotracer activity at the distal right clavicle versus superior scapula which was investigated in 2015 by MRI.  3. Other mild heterogeneous activity in the thorax which appears  degenerative.  Abdominal/pelvic CT: 1. Postprocedural changes of brachytherapy in the prostate gland are noted, without definite evidence to suggest metastatic disease in the abdomen or pelvis. 2. Extensive atherosclerosis, including at least 2 vessel coronary artery disease.     Review of Systems  Constitutional: Positive for fatigue. Negative for appetite change and fever.  HENT: Negative for nosebleeds, sore throat and trouble swallowing.   Eyes: Negative for redness and visual disturbance.  Respiratory: Negative for apnea, cough, shortness of breath and wheezing.   Cardiovascular: Negative for chest pain, palpitations and leg swelling.  Gastrointestinal: Negative for abdominal pain, nausea and vomiting.       No changes in bowel habits.  Genitourinary: Negative for decreased urine volume, dysuria and hematuria.  Musculoskeletal: Positive for back pain (Chronic). Negative for gait problem and myalgias.  Skin: Negative for pallor and rash.  Allergic/Immunologic: Positive for environmental allergies.  Neurological: Negative for seizures, syncope, weakness and headaches.  Psychiatric/Behavioral: Positive for sleep disturbance and suicidal ideas. Negative for confusion.      Current Outpatient Prescriptions on File Prior to Visit  Medication Sig Dispense Refill  . fluticasone (FLONASE) 50 MCG/ACT nasal spray Place 1 spray into both nostrils daily as needed for allergies or rhinitis.    . Multiple Vitamin (MULTIVITAMIN WITH MINERALS) TABS tablet Take 1 tablet by mouth daily.    . naproxen sodium (ANAPROX) 220 MG tablet Take 220 mg by mouth 2 (two) times daily as needed (for soreness).     . traZODone (DESYREL) 50 MG tablet Take 50 mg by mouth at bedtime as needed. As needed  2   No current facility-administered medications on file prior to visit.      Past Medical History:  Diagnosis Date  . Hypertension    no meds  . 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 Radiation   . Sinus problem    Allergies  Allergen Reactions  . Penicillins Rash    Family History  Problem Relation Age of Onset  . Alzheimer's disease Mother   . Alzheimer's disease Father   . Cancer Father     prostate  . Arthritis Sister   . Cancer Brother     prostate    Social History   Social History  . Marital status: Married    Spouse name: N/A  . Number of children: N/A  . Years of education: N/A   Social History Main Topics  . Smoking status: Current Every Day Smoker    Types: Cigars  . Smokeless tobacco: None  . Alcohol use Yes     Comment: 1 beer daily  . Drug use: No  . Sexual activity: Not Asked   Other Topics Concern  . None   Social History Narrative  . None    Vitals:   04/08/16 0928  BP: (!) 146/100  Pulse: 91  Temp: 97.8 F (36.6 C)    Body mass index is 29.37 kg/m.   O2 sat 96% at RA.   Physical Exam  Nursing note and vitals reviewed. Constitutional: He is oriented to person, place, and time. He appears well-developed. No distress.  HENT:  Head: Atraumatic.  Mouth/Throat: Oropharynx is clear and moist and mucous membranes are normal. He has dentures.  Eyes: Conjunctivae and EOM are normal. Pupils are equal, round, and reactive to light.  Cardiovascular: Normal rate.  An irregular rhythm present.  Occasional extrasystoles are present.  No murmur heard. Pulses:      Dorsalis pedis pulses are 1+ on the right side, and 1+ on the left side.       Posterior tibial pulses are 1+ on the right side, and 1+ on the left side.  Normal capilary refill. Varicose veins lower extremities bilateral.  Respiratory: Effort normal and breath sounds normal. No respiratory distress.  GI: Soft. He exhibits no mass. There is no tenderness.  Musculoskeletal: He exhibits no edema or tenderness.  Lymphadenopathy:    He has no cervical adenopathy.  Neurological: He is alert and oriented to person, place, and time. He has normal strength.   Skin: Skin is warm. No erythema.  Psychiatric: He has a normal mood and affect. His speech is normal.  Well groomed, good eye contact.      ASSESSMENT AND PLAN:     Devondre was seen today for new patient (initial visit).  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 Most likely he also had PAD, no claudication. Cardiology referral placed. Instructed about warning signs. Aspirin 81 mg daily recommended, we discussed side effects. Strongly recommended continuing working on smoking cessation.  Comments: Reported on imaging  Orders: -     Ambulatory referral to Cardiology   Irregular heart rate  Reviewing some prior EKGs, last one in September 2015 he had PVCs and PACs among other abnormalities.   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 -     Ambulatory referral to Cardiology  Insomnia, unspecified  Stable.  Good sleep hygiene. Continue trazodone 50 mg at bedtime as needed. Over-the-counter Melatonin extended release might also help.  Anxiety disorder, unspecified  I discussed some side effects of medication. I agree to continue filling prescription, he will take half tablet at the time. Follow-up in 3 months.  -     LORazepam (ATIVAN) 1 MG tablet; Take 0.5-1 tablets (0.5-1 mg total) by mouth daily as needed for anxiety. for anxiety   Elevated blood pressure (not hypertension)  Re-checked 150/95. Continue monitoring BP at home. Low salt diet. Follow-up in 3 months.   Tobacco use disorder  Adverse effects discussed. We discussed other treatment options, he prefers to continue with Nicotine gum.           [name]. [name], MD  [facility]. [place] office.

## 2016-04-08 NOTE — Progress Notes (Signed)
Pre visit review using our clinic review tool, if applicable. No additional management support is needed unless otherwise documented below in the visit note. 

## 2016-04-14 ENCOUNTER — Other Ambulatory Visit (HOSPITAL_BASED_OUTPATIENT_CLINIC_OR_DEPARTMENT_OTHER): Payer: Medicare Other

## 2016-04-14 DIAGNOSIS — C61 Malignant neoplasm of prostate: Secondary | ICD-10-CM | POA: Diagnosis not present

## 2016-04-15 ENCOUNTER — Ambulatory Visit: Payer: Medicare Other

## 2016-04-15 ENCOUNTER — Other Ambulatory Visit: Payer: Medicare Other

## 2016-04-15 ENCOUNTER — Encounter: Payer: Self-pay | Admitting: Family Medicine

## 2016-04-15 LAB — PSA: Prostate Specific Ag, Serum: 7 ng/mL — ABNORMAL HIGH (ref 0.0–4.0)

## 2016-04-15 LAB — TESTOSTERONE: Testosterone, Serum: 13 ng/dL — ABNORMAL LOW (ref 264–916)

## 2016-04-16 ENCOUNTER — Ambulatory Visit (HOSPITAL_BASED_OUTPATIENT_CLINIC_OR_DEPARTMENT_OTHER): Payer: Medicare Other | Admitting: Oncology

## 2016-04-16 ENCOUNTER — Encounter: Payer: Self-pay | Admitting: *Deleted

## 2016-04-16 ENCOUNTER — Telehealth: Payer: Self-pay | Admitting: Oncology

## 2016-04-16 VITALS — BP 159/101 | HR 57 | Temp 97.7°F | Resp 20 | Wt 178.9 lb

## 2016-04-16 DIAGNOSIS — C61 Malignant neoplasm of prostate: Secondary | ICD-10-CM | POA: Diagnosis not present

## 2016-04-16 DIAGNOSIS — E291 Testicular hypofunction: Secondary | ICD-10-CM | POA: Diagnosis not present

## 2016-04-16 DIAGNOSIS — D751 Secondary polycythemia: Secondary | ICD-10-CM

## 2016-04-16 MED ORDER — PREDNISONE 5 MG PO TABS: 5.0000 mg | ORAL_TABLET | Freq: Every day | ORAL | 0 refills | Status: DC

## 2016-04-16 MED ORDER — ABIRATERONE ACETATE 250 MG PO TABS: 1000.0000 mg | ORAL_TABLET | Freq: Every day | ORAL | 0 refills | Status: DC

## 2016-04-16 NOTE — Progress Notes (Signed)
New zytiga script given to oral chemo navigator. Patient and wife given educational materials and patient packet for zytiga. Script for decadron given to patient.

## 2016-04-16 NOTE — Telephone Encounter (Signed)
Gave pt cal & avs °

## 2016-04-16 NOTE — Progress Notes (Signed)
Hematology and Oncology Follow Up Visit  Tony [name] [id number] [birth year] 75 y.o. 04/16/2016 9:17 AM Tony [name], MDMeyers, Tony Main, MD   Principle Diagnosis: 75 year old gentleman with prostate cancer diagnosed in May 2015. His PSA was 15.7, Gleason score is 4+5 = 9 with 12 out of 12 cores involved. His initial clinical staging is T3a N0. His metastatic workup was on revealing.   Prior Therapy: He is status post radiation therapy for external beam radiation completed in September 2015 for a total of 45 gray. He also status post seed implant brachytherapy boost completed in October 2015. He received total of 1 year of androgen deprivation 22,015 and 2016. His PSA nadir was to 0.47 in November 2016. His PSA was 4.08 in May 2017.  Current therapy: Androgen deprivation in the form of Lupron 22.5 milligrams daily to be resumed on 03/02/2016.  Interim History: [name] presents today for a follow-up visit. Since the last visit, he tolerated Lupron therapy without any complications. He reports occasional hot flashes and frequent urination. He continues to feel reasonably well and performs activities of daily living. He is not report any new pathological fractures or bone pain. He does have chronic back pain which is unchanged. His appetite remained excellent and have not lost any weight. . He does not report any headaches blurred vision or double vision. He does not report any chest pain shortness of breath cough or hemoptysis. Does not report any palpitation orthopnea or PND.  He does not report any nausea, vomiting, abdominal pain, or satiety or change in his bowel habits. He does not report any musca skeletal complaints. Is not reporting any bony pain joint pain or joint stiffness. He does not report any skin rashes or lymphadenopathy. Rest of his review of systems unremarkable.   Medications: I have reviewed the patient's current medications.  Current Outpatient Prescriptions   Medication Sig Dispense Refill  . fluticasone (FLONASE) 50 MCG/ACT nasal spray Place 1 spray into both nostrils daily as needed for allergies or rhinitis.    [name] LORazepam (ATIVAN) 1 MG tablet Take 0.5-1 tablets (0.5-1 mg total) by mouth daily as needed for anxiety. for anxiety 30 tablet 1  . Multiple Vitamin (MULTIVITAMIN WITH MINERALS) TABS tablet Take 1 tablet by mouth daily.    . naproxen sodium (ANAPROX) 220 MG tablet Take 220 mg by mouth 2 (two) times daily as needed (for soreness).     . traZODone (DESYREL) 50 MG tablet Take 50 mg by mouth at bedtime as needed. As needed  2  . abiraterone Acetate (ZYTIGA) 250 MG tablet Take 4 tablets (1,000 mg total) by mouth daily. Take on an empty stomach 1 hour before or 2 hours after a meal 120 tablet 0  . predniSONE (DELTASONE) 5 MG tablet Take 1 tablet (5 mg total) by mouth daily with breakfast. 60 tablet 0   No current facility-administered medications for this visit.      Allergies:  Allergies  Allergen Reactions  . Penicillins Rash    Past Medical History, Surgical history, Social history, and Family History were reviewed and updated.   Physical Exam: Blood pressure (!) 159/101, pulse (!) 57, temperature 97.7 F (36.5 C), temperature source Oral, resp. rate 20, weight 178 lb 14.4 oz (81.1 kg), SpO2 96 %. ECOG: 1 General appearance: Alert, awake gentleman without distress. Head: Normocephalic, without obvious abnormality no oral oral thrush noted. Neck: no adenopathy Lymph nodes: Cervical, supraclavicular, and axillary nodes normal. Heart:regular rate and rhythm, S1, S2 normal, no  murmur, click, rub or gallop Lung:chest clear, no wheezing, rales, normal symmetric air entry Abdomin: soft, non-tender, without masses or organomegaly no rebound or guarding. EXT:no erythema, induration, or nodules   Lab Results: Lab Results  Component Value Date   WBC 8.5 02/26/2016   HGB 18.0 (H) 02/26/2016   HCT 51.6 (H) 02/26/2016   MCV 91.3  02/26/2016   PLT 166 02/26/2016     Chemistry      Component Value Date/Time   NA 138 02/26/2016 0805   K 3.8 02/26/2016 0805   CL 103 06/22/2014 0945   CO2 20 (L) 02/26/2016 0805   BUN 13.0 02/26/2016 0805   CREATININE 0.9 02/26/2016 0805   GLU 105 01/10/2016      Component Value Date/Time   CALCIUM 9.1 02/26/2016 0805   ALKPHOS 87 02/26/2016 0805   AST 15 02/26/2016 0805   ALT 10 02/26/2016 0805   BILITOT 0.70 02/26/2016 0805     Results for Tony, [name] (MRN [id number]) as of 04/16/2016 08:35  Ref. Range 04/14/2016 08:55  Testosterone Latest Ref Range: 264 - 916 ng/dL 13 (L)  PSA Latest Ref Range: 0.0 - 4.0 ng/mL 7.0 (H)        EXAM: NUCLEAR MEDICINE WHOLE BODY BONE SCAN  TECHNIQUE: Whole body anterior and posterior images were obtained approximately 3 hours after intravenous injection of radiopharmaceutical.  RADIOPHARMACEUTICALS: 25.8 mCi Technetium-10m MDP IV  COMPARISON: 02/22/2014 whole-body bone scan and 03/06/2014 right shoulder MRI.  FINDINGS: Expected radiotracer activity in the kidneys and urinary bladder. Trace contamination at the perineum.  Unchanged oval intense focus of increased radiotracer activity projecting at the level of the distal third right clavicle and superior scapula. This appears unchanged since 2015.  There is a new mild curvilinear focus of uptake at the left cervicothoracic junction. This was faintly suggested in 2015 and likely is degenerative.  Otherwise stable and normal radiotracer activity throughout the axial and visualized appendicular skeleton.  IMPRESSION: 1. No findings specific for metastatic disease to bone. 2. Unchanged small area of intense radiotracer activity at the distal right clavicle versus superior scapula which was investigated in 2015 by MRI. Despite the inconclusive findings on MRI, the stability since that time and lack of new suspicious findings suggests this is benign. 3.  Other mild heterogeneous activity in the thorax which appears degenerative.   CLINICAL DATA: 75 year old male with history of prostate cancer diagnosed in 2015, status post brachytherapy now complete. Elevated PSA levels. Restaging examination.  EXAM: CT ABDOMEN AND PELVIS WITH CONTRAST  TECHNIQUE: Multidetector CT imaging of the abdomen and pelvis was performed using the standard protocol following bolus administration of intravenous contrast.  CONTRAST: 139mL ISOVUE-300 IOPAMIDOL (ISOVUE-300) INJECTION 61%  COMPARISON: CT the abdomen and pelvis 02/22/2014.  FINDINGS: Lower chest: Tiny pulmonary nodules in the periphery of the lateral segment of the right middle lobe (image 12 of series 4) measuring 4 mm or less in size are unchanged compared to prior study 02/22/2014, considered benign. Atherosclerosis, including calcified atherosclerotic plaques in the left circumflex and right coronary arteries.  Hepatobiliary: Sub cm low-attenuation lesions in segments 6 and 7 of the liver are too small to characterize, but are similar to prior study from 2015, presumably cysts. No new suspicious appearing hepatic lesions are noted. No intra or extrahepatic biliary ductal dilatation. Gallbladder is normal in appearance.  Pancreas: No pancreatic mass. No pancreatic ductal dilatation. No pancreatic or peripancreatic fluid or inflammatory changes.  Spleen: Unremarkable.  Adrenals/Urinary Tract: Exophytic 4.1 x  5.5 cm low-attenuation lesion in the lower pole of the left kidney is compatible with a simple cyst. Focal area of cortical atrophy with dystrophic calcification in the anterior aspect of the interpolar region of the right kidney is similar to prior studies, presumably an area of post infectious/inflammatory scarring. No suspicious renal lesions. No hydroureteronephrosis. Urinary bladder is partially decompressed and demonstrates diffuse wall thickening. Bilateral  adrenal glands are normal in appearance.  Stomach/Bowel: Normal appearance of the stomach. No pathologic dilatation of small bowel or colon. Numerous colonic diverticulae are noted, without surrounding inflammatory changes to suggest an acute diverticulitis at this time. Normal appendix.  Vascular/Lymphatic: Extensive atherosclerosis throughout the abdominal and pelvic vasculature, including mild fusiform aneurysmal dilatation of the infrarenal abdominal aorta which measures up to 3.0 x 3.1 cm. No lymphadenopathy noted in the abdomen or pelvis.  Reproductive: Brachytherapy implants throughout the prostate gland. Seminal vesicles are unremarkable in appearance.  Other: Small right inguinal hernia containing only fat. No significant volume of ascites. No pneumoperitoneum.  Musculoskeletal: There are no aggressive appearing lytic or blastic lesions noted in the visualized portions of the skeleton.  IMPRESSION: 1. Postprocedural changes of brachytherapy in the prostate gland are noted, without definite evidence to suggest metastatic disease in the abdomen or pelvis. 2. Extensive atherosclerosis, including at least 2 vessel coronary artery disease. Assessment for potential risk factor modification, dietary therapy or pharmacologic therapy may be warranted, if clinically indicated. 3. Additional incidental findings, as above.   Impression and Plan:   75 year old gentleman with the following issues:  1. Prostate cancer diagnosed in May 2015. He had a PSA of 15.6 and a Gleason score 4+5 = 9. He received local therapy with external beam radiation as well as seed implants boost completed in October 2015. His PSA nadir was to 0.17 in July 2016. PSA increased up to 6.2 with a testosterone of 697 in June 2017.  CT scan and bone scan obtained on 02/26/2016 showed no measurable disease.  He received Lupron therapy in June 2017 and his testosterone level currently castrate and 19 but  his PSA have increased up to 7. 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 We have discussed treatment option at this time and given the aggressive nature of his cancer it is reasonable to consider second line hormone therapy sooner rather than later.  Risks and benefits of Zytiga therapy were reviewed. Complication that include hypertension, electrolyte imbalance, lower extremity edema as well as complications associated with taking low-dose prednisone were reviewed. We again, lower extremity edema and hypoglycemia were emphasized associated with prednisone. The benefit would be improvement in overall survival, progression free survival and metastatic free survival associated with Zytiga were discussed.  After discussing the risks and benefits today, he is agreeable to proceeding with Zytiga at 1000 mg daily with prednisone at 5 mg daily. The plan is to repeat his PSA in 6 weeks.  2. Androgen depravation: He will receive Lupron 22.5 mg injection every 3 months. Repeat Lupron will be scheduled in September 2017.  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 I counseled him about smoking cessation and the importance of that moving forward.  4. Follow-up: Will be in 6 weeks to check his PSA after Lupron and Zytiga therapy.   [facility], MD 8/3/20179:17 AM

## 2016-04-17 ENCOUNTER — Encounter: Payer: Self-pay | Admitting: Pharmacist

## 2016-04-17 NOTE — Progress Notes (Signed)
Oral Chemotherapy Pharmacist Encounter   Received Rx from Zytiga 1000mg  daily. CBC and CMET from 02/26/16 ok for Zytiga No DDIs noted  Rx sent to [facility] for benefit analysis. Oral Chemo Clinic notified that [facility] need prior authorization.  Called [employer] [facility] PA line at [phone] PA approved, PA# [id number], term dates 04/17/16-09/13/16 Verified that patient does not need to use specialty pharmacy  [facility], they ran script, co-pay [phone].  Oral chemo clinic will begin to work on paperwork for [facility] patient assistance. There are no foundation monies for prostate cancer at this time.  Johny Drilling, PharmD, BCPS Oral Chemotherapy Clinic

## 2016-04-27 ENCOUNTER — Encounter: Payer: Self-pay | Admitting: Pharmacist

## 2016-04-27 NOTE — Progress Notes (Signed)
Oral Chemotherapy Pharmacist Encounter   I met patient and wife in the [facility] lobby this morning. They brought in 2016 federal tax return. Patient also provided SSN and signed [employer] patient assistance application for assistance with Zytiga.  Completed application with copy of insurance card and copy of 2016 1040 faxed to [place] at [phone].  Received confirmation of successful fax.  Oral Chemo Clinic will continue to follow.  Johny Drilling, PharmD, BCPS Oral Chemotherapy Clinic

## 2016-05-05 ENCOUNTER — Telehealth: Payer: Self-pay | Admitting: Pharmacist

## 2016-05-05 MED FILL — ZYTIGA 250 MG TABLET: 250 | 30 days supply | Qty: 120 | Fill #0

## 2016-05-05 NOTE — Telephone Encounter (Signed)
Oral Chemotherapy Pharmacist Encounter   Successfully enrolled patient in Patient [place] co-pay assistance program for metastatic prostate cancer. Award period: 11/07/15-05/05/17 Award amount [id number] Cardholder ID: [id number] BIN: [id number] PCN: [id number]  Group: [id number]  Called [employer] to check on status of application. Was informed pt will need to provide documentation form pharmacy that out of pocket medication expenses have been 4% of gross income since pt is a Medicare patient.  I called wife [name] to alert her of progress. Pt does not think they have spent 4% yet this year. [employer] will be contacting them this week for that information.  I initiated Zytiga fill at [facility] with information from PAF, co-pay is $0. Pt's wife will pick it up on Friday.  Oral Chemo Clinic will continue to follow.  Johny Drilling, PharmD, BCPS Oral Chemotherapy Clinic

## 2016-05-13 ENCOUNTER — Ambulatory Visit (INDEPENDENT_AMBULATORY_CARE_PROVIDER_SITE_OTHER): Payer: Medicare Other | Admitting: Cardiovascular Disease

## 2016-05-13 ENCOUNTER — Ambulatory Visit: Payer: Medicare Other

## 2016-05-13 ENCOUNTER — Encounter: Payer: Self-pay | Admitting: Cardiovascular Disease

## 2016-05-13 ENCOUNTER — Other Ambulatory Visit: Payer: Medicare Other

## 2016-05-13 VITALS — BP 138/72 | HR 49 | Ht 65.0 in | Wt 190.0 lb

## 2016-05-13 DIAGNOSIS — F17200 Nicotine dependence, unspecified, uncomplicated: Secondary | ICD-10-CM

## 2016-05-13 DIAGNOSIS — R0602 Shortness of breath: Secondary | ICD-10-CM | POA: Diagnosis not present

## 2016-05-13 DIAGNOSIS — I714 Abdominal aortic aneurysm, without rupture, unspecified: Secondary | ICD-10-CM

## 2016-05-13 DIAGNOSIS — I495 Sick sinus syndrome: Secondary | ICD-10-CM

## 2016-05-13 DIAGNOSIS — I2510 Atherosclerotic heart disease of native coronary artery without angina pectoris: Secondary | ICD-10-CM

## 2016-05-13 DIAGNOSIS — I453 Trifascicular block: Secondary | ICD-10-CM

## 2016-05-13 DIAGNOSIS — E7800 Pure hypercholesterolemia, unspecified: Secondary | ICD-10-CM

## 2016-05-13 LAB — TSH: TSH: 0.76 m[IU]/L (ref 0.40–4.50)

## 2016-05-13 NOTE — Patient Instructions (Signed)
Medication Instructions: Dr Sallyanne Kuster recommends that you continue on your current medications as directed. Please refer to the Current Medication list given to you today.  Labwork: Your physician recommends that you return for lab work at your inconvenience.  Testing/Procedures: 1. [place] physician has requested that you have a lexiscan myoview. For further information please visit [email or web address]. Please follow instruction sheet, as given.  2. Abdominal Aortic Aneurysm Duplex - Your physician has requested that you have an abdominal aorta duplex. During this test, an ultrasound is used to evaluate the aorta. Allow 30 minutes for this exam. Do not eat after midnight the day before and avoid carbonated beverages.  Follow-up: Dr Sallyanne Kuster recommends that you schedule a follow-up appointment in 1 year. You will receive a reminder letter in the mail two months in advance. If you don't receive a letter, please call our office to schedule the follow-up appointment.  If you need a refill on your cardiac medications before your next appointment, please call your pharmacy.

## 2016-05-14 ENCOUNTER — Encounter: Payer: Self-pay | Admitting: *Deleted

## 2016-05-14 NOTE — Progress Notes (Signed)
Cardiology Consultation Note    Date:  05/14/2016   ID:  Tony [name] 10-Jun-1941, MRN [id number]  PCP:  Tony [name], MD  Cardiologist:   Tony Klein, MD  Reason for consultation: Arterial calcifications noted on CT, abnormal ECG  Chief Complaint  Patient presents with  . New Patient (Initial Visit)    History of Present Illness:  Tony [name] is a 75 y.o. male with recently diagnosed peripheral and coronary arterial atherosclerosis and heavy calcification on imaging studies performed for workup of prostate cancer. CT of the abdomen on June 14 disclosed a small fusiform infrarenal abdominal aortic aneurysm up to 3.1 cm in maximum diameter. He does not have a history of clinically evident peripheral arterial disease or coronary artery disease. He does have a reported history of hypertension but does not take any medications for this. He does not have diabetes mellitus. He is uncertain of his cholesterol level. He just decided to quit smoking 2 days ago but has a lifelong history of cigarette use.  Family history is significant for considerable longevity. Both his parents lived to be in their mid 68s. He does have 1 brother who died suddenly in his 76s, presumably having a cardiac arrest before an otherwise unexplained motor vehicle accident.  Does have some mild exertional dyspnea, occasional wheezing, denies cough and hemoptysis. He denies problems with exertional angina, palpitations, syncope, focal neurological deficits, leg edema, intermittent claudication, abdominal pain or other cardiovascular complaints. He has nasal allergies and difficulty sleeping as well as chronic back pain and fatigue.  He is now on second line chemotherapy for prostate cancer refractory to antiandrogen therapy, .  Past Medical History:  Diagnosis Date  . Hypertension    no meds  . Prostate cancer ([place]) 02/08/14   Gleason 4+5=9, PSA 15.65  . Radiation   . Sinus problem     Past  Surgical History:  Procedure Laterality Date  . PROSTATE BIOPSY  02/08/14   gleason 4+5=9, 12/12 cores positive, 54 gm  . RADIOACTIVE SEED IMPLANT N/A 06/29/2014   Procedure: RADIOACTIVE SEED IMPLANT;  Surgeon: Tony Amass, MD;  Location: [facility];  Service: Urology;  Laterality: N/A;    Current Medications: Outpatient Medications Prior to Visit  Medication Sig Dispense Refill  . abiraterone Acetate (ZYTIGA) 250 MG tablet Take 4 tablets (1,000 mg total) by mouth daily. Take on an empty stomach 1 hour before or 2 hours after a meal 120 tablet 0  . fluticasone (FLONASE) 50 MCG/ACT nasal spray Place 1 spray into both nostrils daily as needed for allergies or rhinitis.    [name] LORazepam (ATIVAN) 1 MG tablet Take 0.5-1 tablets (0.5-1 mg total) by mouth daily as needed for anxiety. for anxiety 30 tablet 1  . Multiple Vitamin (MULTIVITAMIN WITH MINERALS) TABS tablet Take 1 tablet by mouth daily.    . naproxen sodium (ANAPROX) 220 MG tablet Take 220 mg by mouth 2 (two) times daily as needed (for soreness).     . predniSONE (DELTASONE) 5 MG tablet Take 1 tablet (5 mg total) by mouth daily with breakfast. 60 tablet 0  . traZODone (DESYREL) 50 MG tablet Take 50 mg by mouth at bedtime as needed. As needed  2   No facility-administered medications prior to visit.      Allergies:   Penicillins   Social History   Social History  . Marital status: Married    Spouse name: N/A  . Number of children: N/A  .  Years of education: N/A   Social History Main Topics  . Smoking status: Current Every Day Smoker    Types: Cigars  . Smokeless tobacco: None  . Alcohol use Yes     Comment: 1 beer daily  . Drug use: No  . Sexual activity: Not Asked   Other Topics Concern  . None   Social History Narrative  . None     Family History:  The patient's family history includes Alzheimer's disease in his father and mother; Arthritis in his sister; Cancer in his brother and father.   ROS:     Please see the history of present illness.    ROS All other systems reviewed and are negative.   PHYSICAL EXAM:   VS:  BP 138/72 (BP Location: Right Arm, Patient Position: Sitting, Cuff Size: Normal)   Pulse (!) 49   Ht 5\' 5"  (1.651 m)   Wt 190 lb (86.2 kg)   SpO2 97%   BMI 31.62 kg/m    GEN: Well nourished, well developed, in no acute distress  HEENT: normal  Neck: no JVD, carotid bruits, or masses Cardiac: RRR; no murmurs, rubs, or gallops,no edema  Respiratory:  clear to auscultation bilaterally, normal work of breathing GI: soft, nontender, nondistended, + BS MS: no deformity or atrophy  Skin: warm and dry, no rash Neuro:  Alert and Oriented x 3, Strength and sensation are intact Psych: euthymic mood, full affect  Wt Readings from Last 3 Encounters:  05/13/16 190 lb (86.2 kg)  04/16/16 178 lb 14.4 oz (81.1 kg)  04/08/16 176 lb 8 oz (80.1 kg)      Studies/Labs Reviewed:   EKG:  EKG is ordered today.  The ekg ordered today demonstrates Sinus bradycardia with first-degree AV block, left anterior fascicular block and right bundle branch block (PR interval 214 ms, QRS 146 ms, QTC 480 ms). No ischemic repolarization abnormalities are seen. The ECG tracing looks similar in July 2017 and September 2015  Recent Labs: 02/26/2016: ALT 10; BUN 13.0; Creatinine 0.9; HGB 18.0; Platelets 166; Potassium 3.8; Sodium 138 05/13/2016: TSH 0.76   Lipid Panel    Component Value Date/Time   CHOL 179 01/10/2016   TRIG 60 01/10/2016   HDL 52 01/10/2016    Additional studies/ records that were reviewed today include:   notes from primary care provider and urology, CT images    ASSESSMENT:    1. 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[place])   2. Coronary artery calcification seen on CAT scan   3. SSS (sick sinus syndrome) ([place])   4. Trifascicular block   5. Shortness of breath   6. Hypercholesteremia   7. Tobacco use disorder      PLAN:  In order of problems listed  above:  1. AAA: Small and asymptomatic, plan to follow with yearly ultrasound. Likely related to smoking and atherosclerosis. He has successfully quit smoking, albeit only for a couple of days now. Beta blockers are considered, but he is bradycardic, his blood pressure is actually normal. 2. CAD: Will check a functional study to see if the detected heavy coronary calcification has any perfusion consequences. The focus is on risk factor modification. He should take aspirin 81 mg daily.  3. Sinus bradycardia/SSS: Consider evaluation for pacemaker. Chronotropic incompetence may be cause of his dyspnea. 4. RBBB+LAFB+long PR: Probably age-related conduction system disease, without signs or symptoms of advanced heart block. Asked him to immediately notify us of syncope-near syncope, extreme fatigue or sudden worsening dyspnea.  He might require pacemaker therapy in the future. 5. Suspect his dyspnea is primarily related to COPD/smoking. Weight has increased compared to last month, but she does not have overt findings of hypervolemia. 6. HLP: Calculated LDL based on the lipid profile values from April suggest LDL cholesterol around the 110-115. He probably would benefit from statin therapy. Will get an updated lipid profile at his next appointment. Target LDL-C less than 100. Discussed healthy dietary changes. Suggested increased physical activity. He is mildly obese. Note substantial weight gain over the last month which she attributes to current medications for prostate cancer. 7. Congratulated on smoking cessation decision and strongly encouraged to stay quit    Medication Adjustments/Labs and Tests Ordered: Current medicines are reviewed at length with the patient today.  Concerns regarding medicines are outlined above.  Medication changes, Labs and Tests ordered today are listed in the Patient Instructions below. Patient Instructions  Medication Instructions: Dr Sallyanne Kuster recommends that you continue on  your current medications as directed. Please refer to the Current Medication list given to you today.  Labwork: Your physician recommends that you return for lab work at your inconvenience.  Testing/Procedures: 1. [place] physician has requested that you have a lexiscan myoview. For further information please visit [email or web address]. Please follow instruction sheet, as given.  2. Abdominal Aortic Aneurysm Duplex - Your physician has requested that you have an abdominal aorta duplex. During this test, an ultrasound is used to evaluate the aorta. Allow 30 minutes for this exam. Do not eat after midnight the day before and avoid carbonated beverages.  Follow-up: Dr Sallyanne Kuster recommends that you schedule a follow-up appointment in 1 year. You will receive a reminder letter in the mail two months in advance. If you don't receive a letter, please call our office to schedule the follow-up appointment.  If you need a refill on your cardiac medications before your next appointment, please call your pharmacy.    Signed, Tony Klein, MD  05/14/2016 4:36 PM    [facility] [address], [place], [place]  [address] Phone: [phone]; Fax: [phone]

## 2016-05-15 DIAGNOSIS — R001 Bradycardia, unspecified: Secondary | ICD-10-CM | POA: Insufficient documentation

## 2016-05-15 DIAGNOSIS — I2510 Atherosclerotic heart disease of native coronary artery without angina pectoris: Secondary | ICD-10-CM | POA: Insufficient documentation

## 2016-05-19 ENCOUNTER — Telehealth (HOSPITAL_COMMUNITY): Payer: Self-pay

## 2016-05-19 NOTE — Telephone Encounter (Signed)
Encounter complete. 

## 2016-05-21 ENCOUNTER — Ambulatory Visit (HOSPITAL_COMMUNITY)
Admission: RE | Admit: 2016-05-21 | Discharge: 2016-05-21 | Disposition: A | Discharge status: Home / Self Care | Payer: Medicare Other | Source: Ambulatory Visit | Attending: Cardiovascular Disease | Admitting: Cardiovascular Disease

## 2016-05-21 DIAGNOSIS — I2510 Atherosclerotic heart disease of native coronary artery without angina pectoris: Secondary | ICD-10-CM | POA: Insufficient documentation

## 2016-05-21 DIAGNOSIS — R9439 Abnormal result of other cardiovascular function study: Secondary | ICD-10-CM | POA: Insufficient documentation

## 2016-05-21 DIAGNOSIS — R0602 Shortness of breath: Secondary | ICD-10-CM

## 2016-05-21 DIAGNOSIS — Z720 Tobacco use: Secondary | ICD-10-CM | POA: Insufficient documentation

## 2016-05-21 DIAGNOSIS — I714 Abdominal aortic aneurysm, without rupture: Secondary | ICD-10-CM | POA: Insufficient documentation

## 2016-05-21 LAB — MYOCARDIAL PERFUSION IMAGING
CHL CUP NUCLEAR SDS: 1
CHL CUP RESTING HR STRESS: 53 {beats}/min
CHL CUP STRESS STAGE 1 HR: 50 {beats}/min
CHL CUP STRESS STAGE 3 HR: 52 {beats}/min
CHL CUP STRESS STAGE 3 SPEED: 0 mph
CHL CUP STRESS STAGE 4 GRADE: 0 %
CHL CUP STRESS STAGE 4 HR: 65 {beats}/min
CSEPEW: 1 METS
CSEPPBP: 144 mmHg
CSEPPHR: 52 {beats}/min
LV sys vol: 50 mL
LVDIAVOL: 121 mL (ref 62–150)
NUC STRESS TID: 1.12
Percent of predicted max HR: 35 %
SRS: 0
SSS: 1
Stage 1 DBP: 101 mmHg
Stage 1 Grade: 0 %
Stage 1 SBP: 148 mmHg
Stage 1 Speed: 0 mph
Stage 2 Grade: 0 %
Stage 2 HR: 50 {beats}/min
Stage 2 Speed: 0 mph
Stage 3 DBP: 95 mmHg
Stage 3 Grade: 0 %
Stage 3 SBP: 144 mmHg
Stage 4 DBP: 91 mmHg
Stage 4 SBP: 153 mmHg
Stage 4 Speed: 0 mph

## 2016-05-21 MED ORDER — TECHNETIUM TC 99M TETROFOSMIN IV KIT
30.8000 | PACK | Freq: Once | INTRAVENOUS | Status: AC | PRN
  Administered 2016-05-21: 30.8 via INTRAVENOUS
  Filled 2016-05-21: qty 31

## 2016-05-21 MED ORDER — REGADENOSON 0.4 MG/5ML IV SOLN
0.4000 mg | Freq: Once | INTRAVENOUS | Status: AC
  Administered 2016-05-21: 0.4 mg via INTRAVENOUS

## 2016-05-21 MED ORDER — TECHNETIUM TC 99M TETROFOSMIN IV KIT
10.6000 | PACK | Freq: Once | INTRAVENOUS | Status: AC | PRN
  Administered 2016-05-21: 11 via INTRAVENOUS
  Filled 2016-05-21: qty 11

## 2016-05-22 ENCOUNTER — Ambulatory Visit (HOSPITAL_COMMUNITY): Payer: Medicare Other

## 2016-05-25 ENCOUNTER — Telehealth: Payer: Self-pay | Admitting: Pharmacist

## 2016-05-25 NOTE — Telephone Encounter (Signed)
Received a call from patient's wife, [name] - she stated the [employer] Co-Pay Assistance Relief Program needed the term "metastatic prostate cancer" on one of the forms for the assistance to continue.  I called copay assistance foundation and completed the form as requested with the term "metastatic prostate cancer" in the diagnosis. Faxed form back to [employer] Co-Pay relief Program at [phone]. Fax confirmation received.  Raul Del, PharmD, [place], [facility]  [phone]

## 2016-05-25 NOTE — Telephone Encounter (Signed)
I spoke with patient wife and reviewed highlights regarding Zytiga.  Pt is doing well. Zytiga was initiated on 05/09/16.   Counseled patient on administration, dosing, side effects, safe handling, and monitoring. Side effects include but not limited to: Hypertension, fatigue, swelling, rash, diarrhea and muscle aches. We also reviewed Dr. [name] with be getting labs to monitor patient liver function.   Per [name], patient has not missed any doses. He takes his Zytiga (4 tablets) at 5am and his Prednisone with breakfast at 7am. He is tolerating the medication great without any side effects. [name] stated his heart study on 05/21/16 went well.  All questions answered.  Will follow up in 2 weeks on 9/22 for adherence and toxicity management with Dr. [name] office visit. Labs on 9/20.    Thank you,  Raul Del, PharmD, [place], [facility] [phone]

## 2016-06-02 ENCOUNTER — Other Ambulatory Visit: Payer: Medicare Other

## 2016-06-03 ENCOUNTER — Ambulatory Visit: Payer: Medicare Other

## 2016-06-03 ENCOUNTER — Ambulatory Visit (HOSPITAL_BASED_OUTPATIENT_CLINIC_OR_DEPARTMENT_OTHER): Payer: Medicare Other

## 2016-06-03 ENCOUNTER — Other Ambulatory Visit (HOSPITAL_BASED_OUTPATIENT_CLINIC_OR_DEPARTMENT_OTHER): Payer: Medicare Other

## 2016-06-03 ENCOUNTER — Ambulatory Visit: Payer: Medicare Other | Admitting: Oncology

## 2016-06-03 VITALS — BP 160/80 | HR 58 | Temp 98.0°F | Resp 18

## 2016-06-03 DIAGNOSIS — Z5111 Encounter for antineoplastic chemotherapy: Secondary | ICD-10-CM

## 2016-06-03 DIAGNOSIS — C61 Malignant neoplasm of prostate: Secondary | ICD-10-CM | POA: Diagnosis not present

## 2016-06-03 LAB — COMPREHENSIVE METABOLIC PANEL
ALT: 17 U/L (ref 0–55)
AST: 16 U/L (ref 5–34)
Albumin: 3.6 g/dL (ref 3.5–5.0)
Alkaline Phosphatase: 86 U/L (ref 40–150)
Anion Gap: 9 mEq/L (ref 3–11)
BUN: 18.1 mg/dL (ref 7.0–26.0)
CALCIUM: 9.2 mg/dL (ref 8.4–10.4)
CHLORIDE: 107 meq/L (ref 98–109)
CO2: 26 meq/L (ref 22–29)
CREATININE: 0.8 mg/dL (ref 0.7–1.3)
EGFR: 88 mL/min/{1.73_m2} — ABNORMAL LOW (ref >90)
GLUCOSE: 112 mg/dL (ref 70–140)
POTASSIUM: 4.1 meq/L (ref 3.5–5.1)
SODIUM: 141 meq/L (ref 136–145)
Total Bilirubin: 0.6 mg/dL (ref 0.20–1.20)
Total Protein: 6.9 g/dL (ref 6.4–8.3)

## 2016-06-03 LAB — CBC WITH DIFFERENTIAL/PLATELET
BASO%: 0.7 % (ref 0.0–2.0)
Basophils Absolute: 0 10*3/uL (ref 0.0–0.1)
EOS%: 0.8 % (ref 0.0–7.0)
Eosinophils Absolute: 0.1 10*3/uL (ref 0.0–0.5)
HEMATOCRIT: 48.2 % (ref 38.4–49.9)
HGB: 16 g/dL (ref 13.0–17.1)
LYMPH#: 0.8 10*3/uL — AB (ref 0.9–3.3)
LYMPH%: 12.1 % — ABNORMAL LOW (ref 14.0–49.0)
MCH: 31 pg (ref 27.2–33.4)
MCHC: 33.2 g/dL (ref 32.0–36.0)
MCV: 93.3 fL (ref 79.3–98.0)
MONO#: 0.3 10*3/uL (ref 0.1–0.9)
MONO%: 5.4 % (ref 0.0–14.0)
NEUT#: 5.3 10*3/uL (ref 1.5–6.5)
NEUT%: 81 % — AB (ref 39.0–75.0)
Platelets: 143 10*3/uL (ref 140–400)
RBC: 5.17 10*6/uL (ref 4.20–5.82)
RDW: 13.4 % (ref 11.0–14.6)
WBC: 6.5 10*3/uL (ref 4.0–10.3)

## 2016-06-03 MED ORDER — LEUPROLIDE ACETATE (4 MONTH) 30 MG IM KIT
22.5000 mg | PACK | Freq: Once | INTRAMUSCULAR | Status: AC
  Administered 2016-06-03: 22.5 mg via INTRAMUSCULAR
  Filled 2016-06-03: qty 30

## 2016-06-03 NOTE — Patient Instructions (Signed)
Leuprolide depot injection What is this medicine? LEUPROLIDE (loo PROE lide) is a man-made protein that acts like a natural hormone in the body. It decreases testosterone in men and decreases estrogen in women. In men, this medicine is used to treat advanced prostate cancer. In women, some forms of this medicine may be used to treat endometriosis, uterine fibroids, or other male hormone-related problems. This medicine may be used for other purposes; ask your health care provider or pharmacist if you have questions. What should I tell my health care provider before I take this medicine? They need to know if you have any of these conditions: -diabetes -heart disease or previous heart attack -high blood pressure -high cholesterol -osteoporosis -pain or difficulty passing urine -spinal cord metastasis -stroke -tobacco smoker -unusual vaginal bleeding (women) -an unusual or allergic reaction to leuprolide, benzyl alcohol, other medicines, foods, dyes, or preservatives -pregnant or trying to get pregnant -breast-feeding How should I use this medicine? This medicine is for injection into a muscle or for injection under the skin. It is given by a health care professional in a hospital or clinic setting. The specific product will determine how it will be given to you. Make sure you understand which product you receive and how often you will receive it. Talk to your pediatrician regarding the use of this medicine in children. Special care may be needed. Overdosage: If you think you have taken too much of this medicine contact a poison control center or emergency room at once. NOTE: This medicine is only for you. Do not share this medicine with others. What if I miss a dose? It is important not to miss a dose. Call your doctor or health care professional if you are unable to keep an appointment. Depot injections: Depot injections are given either once-monthly, every 12 weeks, every 16 weeks, or  every 24 weeks depending on the product you are prescribed. The product you are prescribed will be based on if you are male or male, and your condition. Make sure you understand your product and dosing. What may interact with this medicine? Do not take this medicine with any of the following medications: -chasteberry This medicine may also interact with the following medications: -herbal or dietary supplements, like black cohosh or DHEA -male hormones, like estrogens or progestins and birth control pills, patches, rings, or injections -male hormones, like testosterone This list may not describe all possible interactions. Give your health care provider a list of all the medicines, herbs, non-prescription drugs, or dietary supplements you use. Also tell them if you smoke, drink alcohol, or use illegal drugs. Some items may interact with your medicine. What should I watch for while using this medicine? Visit your doctor or health care professional for regular checks on your progress. During the first weeks of treatment, your symptoms may get worse, but then will improve as you continue your treatment. You may get hot flashes, increased bone pain, increased difficulty passing urine, or an aggravation of nerve symptoms. Discuss these effects with your doctor or health care professional, some of them may improve with continued use of this medicine. Male patients may experience a menstrual cycle or spotting during the first months of therapy with this medicine. If this continues, contact your doctor or health care professional. What side effects may I notice from receiving this medicine? Side effects that you should report to your doctor or health care professional as soon as possible: -allergic reactions like skin rash, itching or hives, swelling of the   face, lips, or tongue -breathing problems -chest pain -depression or memory disorders -pain in your legs or groin -pain at site where injected or  implanted -severe headache -swelling of the feet and legs -visual changes -vomiting Side effects that usually do not require medical attention (report to your doctor or health care professional if they continue or are bothersome): -breast swelling or tenderness -decrease in sex drive or performance -diarrhea -hot flashes -loss of appetite -muscle, joint, or bone pains -nausea -redness or irritation at site where injected or implanted -skin problems or acne This list may not describe all possible side effects. Call your doctor for medical advice about side effects. You may report side effects to FDA at 1-800-FDA-1088. Where should I keep my medicine? This drug is given in a hospital or clinic and will not be stored at home. NOTE: This sheet is a summary. It may not cover all possible information. If you have questions about this medicine, talk to your doctor, pharmacist, or health care provider.    2016, [employer]. (2014-05-25 14:16:23)  

## 2016-06-04 LAB — PSA

## 2016-06-05 ENCOUNTER — Ambulatory Visit (HOSPITAL_BASED_OUTPATIENT_CLINIC_OR_DEPARTMENT_OTHER): Payer: Medicare Other | Admitting: Oncology

## 2016-06-05 ENCOUNTER — Other Ambulatory Visit: Payer: Self-pay | Admitting: *Deleted

## 2016-06-05 ENCOUNTER — Telehealth: Payer: Self-pay | Admitting: Oncology

## 2016-06-05 VITALS — BP 154/81 | HR 55 | Temp 97.8°F | Resp 16 | Ht 65.0 in | Wt 196.5 lb

## 2016-06-05 DIAGNOSIS — E291 Testicular hypofunction: Secondary | ICD-10-CM

## 2016-06-05 DIAGNOSIS — D751 Secondary polycythemia: Secondary | ICD-10-CM

## 2016-06-05 DIAGNOSIS — C61 Malignant neoplasm of prostate: Secondary | ICD-10-CM | POA: Diagnosis not present

## 2016-06-05 MED ORDER — ABIRATERONE ACETATE 250 MG PO TABS: 1000.0000 mg | ORAL_TABLET | Freq: Every day | ORAL | 0 refills | Status: DC

## 2016-06-05 MED FILL — ZYTIGA 250 MG TABLET: 250 | 30 days supply | Qty: 120 | Fill #0

## 2016-06-05 NOTE — Telephone Encounter (Signed)
Avs report and appointment schedule given to patient, per 06/05/16 los. °

## 2016-06-05 NOTE — Progress Notes (Signed)
Hematology and Oncology Follow Up Visit  Assael Loeffelholz [id number] 25-Jun-1941 75 y.o. 06/05/2016 12:09 PM [name], [name], [name], MD   Principle Diagnosis: 75 year old gentleman with prostate cancer diagnosed in May 2015. His PSA was 15.7, Gleason score is 4+5 = 9 with 12 out of 12 cores involved. His initial clinical staging is T3a N0. His metastatic workup was on revealing.   Prior Therapy: He is status post radiation therapy for external beam radiation completed in September 2015 for a total of 45 gray. He also status post seed implant brachytherapy boost completed in October 2015. He received total of 1 year of androgen deprivation 22,015 and 2016. His PSA nadir was to 0.47 in November 2016. His PSA was 4.08 in May 2017.  Current therapy:   Androgen deprivation in the form of Lupron 22.5 mg resumed on 03/02/2016. This will be given every 3 months. Zytiga 1000 mg with prednisone started around August 24 of 2017.   Interim History: Mr. Mumaw presents today for a follow-up visit. Since the last visit, he started Zytiga and continues to tolerated it well. He does not report any nausea, abdominal pain or peripheral neuropathy. He did not report any lower extremity edema. He reports occasional hot flashes and frequent urination associated with Lupron.   He continues to feel reasonably well and performs activities of daily living. He is not report any new pathological fractures or bone pain. He does have chronic back pain which is unchanged. His appetite remained excellent have gained weight since last visit. [name] He does not report any headaches blurred vision or double vision. He does not report any fevers, chills or sweats. He does not report any chest pain shortness of breath cough or hemoptysis. Does not report any palpitation orthopnea or PND.  He does not report any nausea, vomiting, abdominal pain, or satiety or change in his bowel habits. He does not report any musca skeletal  complaints. He does not report any skin rashes or lymphadenopathy. Rest of his review of systems unremarkable.   Medications: I have reviewed the patient's current medications.  Current Outpatient Prescriptions  Medication Sig Dispense Refill  . abiraterone Acetate (ZYTIGA) 250 MG tablet Take 4 tablets (1,000 mg total) by mouth daily. Take on an empty stomach 1 hour before or 2 hours after a meal 120 tablet 0  . fluticasone (FLONASE) 50 MCG/ACT nasal spray Place 1 spray into both nostrils daily as needed for allergies or rhinitis.    [name] LORazepam (ATIVAN) 1 MG tablet Take 0.5-1 tablets (0.5-1 mg total) by mouth daily as needed for anxiety. for anxiety 30 tablet 1  . Multiple Vitamin (MULTIVITAMIN WITH MINERALS) TABS tablet Take 1 tablet by mouth daily.    . naproxen sodium (ANAPROX) 220 MG tablet Take 220 mg by mouth 2 (two) times daily as needed (for soreness).     . predniSONE (DELTASONE) 5 MG tablet Take 1 tablet (5 mg total) by mouth daily with breakfast. 60 tablet 0  . traZODone (DESYREL) 50 MG tablet Take 50 mg by mouth at bedtime as needed. As needed  2   No current facility-administered medications for this visit.      Allergies:  Allergies  Allergen Reactions  . Penicillins Rash    Past Medical History, Surgical history, Social history, and Family History were reviewed and updated.   Physical Exam: Blood pressure (!) 154/81, pulse (!) 55, temperature 97.8 F (36.6 C), temperature source Oral, resp. rate 16, height 5\' 5"  (1.651 m),  weight 196 lb 8 oz (89.1 kg), SpO2 99 %. ECOG: 1 General appearance: Well-appearing gentleman without distress. Head: Normocephalic, without obvious abnormality no oral thrush or ulcers. Neck: no adenopathy Lymph nodes: Cervical, supraclavicular, and axillary nodes normal. Heart:regular rate and rhythm, S1, S2 normal, no murmur, click, rub or gallop Lung:chest clear, no wheezing, rales, normal symmetric air entry Abdomin: soft, non-tender,  without masses or organomegaly no shifting dullness or ascites. EXT:no erythema, induration, or nodules   Lab Results: Lab Results  Component Value Date   WBC 6.5 06/03/2016   HGB 16.0 06/03/2016   HCT 48.2 06/03/2016   MCV 93.3 06/03/2016   PLT 143 06/03/2016     Chemistry      Component Value Date/Time   NA 141 06/03/2016 0911   K 4.1 06/03/2016 0911   CL 103 06/22/2014 0945   CO2 26 06/03/2016 0911   BUN 18.1 06/03/2016 0911   CREATININE 0.8 06/03/2016 0911   GLU 105 01/10/2016      Component Value Date/Time   CALCIUM 9.2 06/03/2016 0911   ALKPHOS 86 06/03/2016 0911   AST 16 06/03/2016 0911   ALT 17 06/03/2016 0911   BILITOT 0.60 06/03/2016 0911             Impression and Plan:   75 year old gentleman with the following issues:  1. Prostate cancer diagnosed in May 2015. He had a PSA of 15.6 and a Gleason score 4+5 = 9. He received local therapy with external beam radiation as well as seed implants boost completed in October 2015. His PSA nadir was to 0.17 in July 2016. PSA increased up to 6.2 with a testosterone of 697 in June 2017.  CT scan and bone scan obtained on 02/26/2016 showed no measurable disease.  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 He was started on Zytiga and had an excellent response with a PSA less than 0.1. He tolerated this medication well without any complaints and he is willing to continue with the same dose and schedule. The plan is to continue this medication at this time.  2. Androgen depravation: He will receive Lupron 22.5 mg injection every 3 months. Repeat Lupron will be scheduled in 3 months.  3. Polycythemia: Hemoglobin seems to be improving after he decrease his smoking.  4. Follow-up: Will be in 6 weeks to check his PSA.   [facility], MD 9/22/201712:09 PM

## 2016-06-10 ENCOUNTER — Ambulatory Visit: Payer: Medicare Other

## 2016-06-10 ENCOUNTER — Other Ambulatory Visit: Payer: Medicare Other

## 2016-06-10 ENCOUNTER — Ambulatory Visit (HOSPITAL_COMMUNITY)
Admission: RE | Admit: 2016-06-10 | Discharge: 2016-06-10 | Disposition: A | Discharge status: Home / Self Care | Payer: Medicare Other | Source: Ambulatory Visit | Attending: Cardiovascular Disease | Admitting: Cardiovascular Disease

## 2016-06-10 DIAGNOSIS — E785 Hyperlipidemia, unspecified: Secondary | ICD-10-CM | POA: Insufficient documentation

## 2016-06-10 DIAGNOSIS — Z87891 Personal history of nicotine dependence: Secondary | ICD-10-CM | POA: Insufficient documentation

## 2016-06-10 DIAGNOSIS — I708 Atherosclerosis of other arteries: Secondary | ICD-10-CM | POA: Diagnosis not present

## 2016-06-10 DIAGNOSIS — I2510 Atherosclerotic heart disease of native coronary artery without angina pectoris: Secondary | ICD-10-CM | POA: Insufficient documentation

## 2016-06-10 DIAGNOSIS — I714 Abdominal aortic aneurysm, without rupture, unspecified: Secondary | ICD-10-CM

## 2016-06-11 ENCOUNTER — Telehealth: Payer: Self-pay

## 2016-06-11 DIAGNOSIS — I714 Abdominal aortic aneurysm, without rupture, unspecified: Secondary | ICD-10-CM

## 2016-06-11 NOTE — Telephone Encounter (Signed)
-----   Message from Sanda Klein, MD sent at 06/11/2016  9:58 AM EDT ----- Small AAA, 3.3 cm (3.1 cm by CT, not a meaningful difference). Repeat in 1 year.

## 2016-06-27 ENCOUNTER — Other Ambulatory Visit: Payer: Self-pay | Admitting: Oncology

## 2016-06-30 ENCOUNTER — Other Ambulatory Visit: Payer: Self-pay | Admitting: Oncology

## 2016-06-30 MED FILL — ZYTIGA 250 MG TABLET: 250 | 30 days supply | Qty: 120 | Fill #0

## 2016-07-06 DIAGNOSIS — I714 Abdominal aortic aneurysm, without rupture, unspecified: Secondary | ICD-10-CM | POA: Insufficient documentation

## 2016-07-08 ENCOUNTER — Other Ambulatory Visit: Payer: Medicare Other

## 2016-07-08 ENCOUNTER — Ambulatory Visit: Payer: Medicare Other

## 2016-07-08 ENCOUNTER — Ambulatory Visit: Payer: Medicare Other | Admitting: Oncology

## 2016-07-09 ENCOUNTER — Ambulatory Visit (INDEPENDENT_AMBULATORY_CARE_PROVIDER_SITE_OTHER): Payer: Medicare Other | Admitting: Family Medicine

## 2016-07-09 ENCOUNTER — Encounter: Payer: Self-pay | Admitting: Family Medicine

## 2016-07-09 VITALS — BP 140/85 | HR 66 | Resp 12 | Ht 65.0 in | Wt 189.5 lb

## 2016-07-09 DIAGNOSIS — F418 Other specified anxiety disorders: Secondary | ICD-10-CM | POA: Diagnosis not present

## 2016-07-09 DIAGNOSIS — I714 Abdominal aortic aneurysm, without rupture, unspecified: Secondary | ICD-10-CM

## 2016-07-09 DIAGNOSIS — I10 Essential (primary) hypertension: Secondary | ICD-10-CM | POA: Diagnosis not present

## 2016-07-09 DIAGNOSIS — G4700 Insomnia, unspecified: Secondary | ICD-10-CM

## 2016-07-09 MED ORDER — LORAZEPAM 1 MG PO TABS: 0.5000 mg | ORAL_TABLET | Freq: Every day | ORAL | 1 refills | Status: DC | PRN

## 2016-07-09 NOTE — Progress Notes (Signed)
HPI:   Tony [name] is a 75 y.o. male, who is here today with his wife to follow on last OV, 04/08/16. He is following with urologists and ongoing chemotherapy for prostate cancer.  Still having hot flashes, attributed to one of medications he is taking for prostate cancer; but in general he feels like he is tolerating medications better. He is also on daily Prednisone.  He saw cardiologist 05/2016, Dx with aortic aneurysm. According to wife, his heart is good and monitoring aneurysm annually. 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 No lymphadenopathy noted in the abdomen or pelvis.  Hypertension:  Currently on non-pharmacologic treatment. He is not checking BP at home.  Noticed that his BP has been elevated during prior office visits as well as during recent appointment with urologist.  He has not noted unusual headache, visual changes, exertional chest pain, dyspnea (has had occasionally exertional dyspnea not reported today),  focal weakness, or edema. Hx of sinus sick synd, has not had dizziness or syncopal episodes.  + HLD, he is not on statin medication.   Lab Results  Component Value Date   CREATININE 0.8 06/03/2016   BUN 18.1 06/03/2016   NA 141 06/03/2016   K 4.1 06/03/2016   CL 103 06/22/2014   CO2 26 06/03/2016   He takes OTC Aleve as needed for arthralgias, mainly knee, states that he doesn't take it very often. Since his last OV he quit smoking.   Anxiety: Currently he is on lorazepam 1 mg, he takes half tablet daily if needed, he denies any side effect and he feels like his anxiety is stable.  + Intermittent insomnia, takes Trazodone as needed and helps with sleep. He is tolerating medications well, he denies any side effect.   Concerns today: Medication  refill.     Review of Systems  Constitutional: Negative for appetite change, fatigue, fever and unexpected weight change.  HENT: Negative for nosebleeds, sore throat and trouble swallowing.   Eyes: Negative for pain and visual disturbance.  Respiratory: Negative for cough, shortness of breath and wheezing.   Cardiovascular: Negative for chest pain, palpitations and leg swelling.  Gastrointestinal: Negative for abdominal pain, nausea and vomiting.       No changes in bowel habits.  Genitourinary: Negative for decreased urine volume, dysuria and hematuria.  Musculoskeletal: Positive for arthralgias (mainly knees, mild). Negative for gait problem.  Skin: Negative for rash.  Neurological: Negative for syncope, weakness, numbness and headaches.  Psychiatric/Behavioral: Positive for sleep disturbance. Negative for confusion. The patient is not nervous/anxious.       Current Outpatient Prescriptions on File Prior to Visit  Medication Sig Dispense Refill  . fluticasone (FLONASE) 50 MCG/ACT nasal spray Place 1 spray into both nostrils daily as needed for allergies or rhinitis.    . Multiple Vitamin (MULTIVITAMIN WITH MINERALS) TABS tablet Take 1 tablet by mouth daily.    . naproxen sodium (ANAPROX) 220 MG tablet Take 220 mg by mouth 2 (two) times daily as needed (for soreness).     . predniSONE (DELTASONE) 5 MG tablet TAKE 1 TABLET BY MOUTH DAILY WITH BREAKFAST 60 tablet 0  . traZODone (DESYREL) 50 MG tablet Take 50 mg by mouth at bedtime as needed. As needed  2  . ZYTIGA 250 MG tablet TAKE 4 TABLETS BY MOUTH DAILY. TAKE ON AN EMPTY STOMACH 1 HOUR BEFORE  OR 2 HOURS AFTER A MEAL 120 tablet 0   No current facility-administered medications on file prior to visit.      Past Medical History:  Diagnosis Date  . Hypertension    no meds  . Prostate cancer ([place]) 02/08/14   Gleason 4+5=9, PSA 15.65  . Radiation   . Sinus problem    Allergies  Allergen Reactions  . Penicillins Rash     Social History   Social History  . Marital status: Married    Spouse name: N/A  . Number of children: N/A  . Years of education: N/A   Social History Main Topics  . Smoking status: Former Smoker    Types: Cigars  . Smokeless tobacco: Never Used  . Alcohol use Yes     Comment: 1 beer daily  . Drug use: No  . Sexual activity: Not Currently   Other Topics Concern  . None   Social History Narrative  . None    Vitals:   07/09/16 0750  BP: 140/85  Pulse: 66  Resp: 12   O2 sat at RA 94%.   Body mass index is 31.53 kg/m.   Physical Exam  Nursing note and vitals reviewed. Constitutional: He is oriented to person, place, and time. He appears well-developed. No distress.  HENT:  Head: Atraumatic.  Mouth/Throat: Oropharynx is clear and moist and mucous membranes are normal. He has dentures.  Eyes: Conjunctivae and EOM are normal. Pupils are equal, round, and reactive to light.  Neck: No thyroid mass and no thyromegaly present.  Cardiovascular: Normal rate and regular rhythm.   No murmur heard. DP present bilateral. Varicose veins lower extremities bilateral.  Respiratory: Effort normal and breath sounds normal. No respiratory distress.  GI: Soft. He exhibits no mass. There is no hepatomegaly. There is no tenderness.  Musculoskeletal: He exhibits no edema or tenderness.  Lymphadenopathy:    He has no cervical adenopathy.  Neurological: He is alert and oriented to person, place, and time. He has normal strength.  Skin: Skin is warm. No erythema.  Psychiatric: He has a normal mood and affect. His speech is normal.  Well groomed, good eye contact.      ASSESSMENT AND PLAN:     Dijon was seen today for follow-up.  Diagnoses and all orders for this visit:   Insomnia, unspecified type  Stable. No changes in current management. Some side effects of medication discussed, fall precautions. F/U in 6 months.  Essential hypertension, benign  Today  adequately controlled. Recommend monitoring BP at home. Low salt diet. Caution with NSAID's. F/U in 5-6 months, before if needed.  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HCC)  Asymptomatic. Clearly instructed about warning signs. Annual follow up as recommended.  Other specified anxiety disorders  Stable. No changes in current management since he takes medication seldom. Some side effects discussed. F/U in 6 months, before if needed.   -     LORazepam (ATIVAN) 1 MG tablet; Take 0.5-1 tablets (0.5-1 mg total) by mouth daily as needed for anxiety. for anxiety   -According to pt, has appt for fasting labs in 07/2016 , ordered by Dr Sallyanne Kuster and includes [place].   -Mr. Tamar Zachman Foglesong was advised to return sooner than planned today if new concerns arise.       Ilya Ess G. [name], MD  [facility]. [place] office.

## 2016-07-09 NOTE — Patient Instructions (Addendum)
A few things to remember from today's visit:   Other specified anxiety disorders - Plan: LORazepam (ATIVAN) 1 MG tablet  Insomnia, unspecified type  Essential hypertension, benign  Monitor blood pressure. Fall precautions.   Please be sure medication list is accurate. If a new problem present, please set up appointment sooner than planned today.

## 2016-07-12 ENCOUNTER — Other Ambulatory Visit: Payer: Self-pay | Admitting: Family Medicine

## 2016-07-12 DIAGNOSIS — C61 Malignant neoplasm of prostate: Secondary | ICD-10-CM

## 2016-07-14 ENCOUNTER — Other Ambulatory Visit (HOSPITAL_BASED_OUTPATIENT_CLINIC_OR_DEPARTMENT_OTHER): Payer: Medicare Other

## 2016-07-14 DIAGNOSIS — C61 Malignant neoplasm of prostate: Secondary | ICD-10-CM

## 2016-07-14 LAB — CBC WITH DIFFERENTIAL/PLATELET
BASO%: 1 % (ref 0.0–2.0)
Basophils Absolute: 0.1 10*3/uL (ref 0.0–0.1)
EOS ABS: 0.1 10*3/uL (ref 0.0–0.5)
EOS%: 1.8 % (ref 0.0–7.0)
HEMATOCRIT: 47.7 % (ref 38.4–49.9)
HEMOGLOBIN: 15.7 g/dL (ref 13.0–17.1)
LYMPH#: 1 10*3/uL (ref 0.9–3.3)
LYMPH%: 15.4 % (ref 14.0–49.0)
MCH: 31.1 pg (ref 27.2–33.4)
MCHC: 32.9 g/dL (ref 32.0–36.0)
MCV: 94.6 fL (ref 79.3–98.0)
MONO#: 0.4 10*3/uL (ref 0.1–0.9)
MONO%: 7.2 % (ref 0.0–14.0)
NEUT%: 74.6 % (ref 39.0–75.0)
NEUTROS ABS: 4.6 10*3/uL (ref 1.5–6.5)
PLATELETS: 150 10*3/uL (ref 140–400)
RBC: 5.04 10*6/uL (ref 4.20–5.82)
RDW: 13.3 % (ref 11.0–14.6)
WBC: 6.2 10*3/uL (ref 4.0–10.3)

## 2016-07-14 LAB — COMPREHENSIVE METABOLIC PANEL
ALBUMIN: 3.7 g/dL (ref 3.5–5.0)
ALK PHOS: 78 U/L (ref 40–150)
ALT: 54 U/L (ref 0–55)
AST: 49 U/L — ABNORMAL HIGH (ref 5–34)
Anion Gap: 8 mEq/L (ref 3–11)
BILIRUBIN TOTAL: 0.85 mg/dL (ref 0.20–1.20)
BUN: 13.6 mg/dL (ref 7.0–26.0)
CO2: 28 mEq/L (ref 22–29)
Calcium: 9.1 mg/dL (ref 8.4–10.4)
Chloride: 107 mEq/L (ref 98–109)
Creatinine: 0.8 mg/dL (ref 0.7–1.3)
EGFR: 87 mL/min/{1.73_m2} — AB (ref >90)
GLUCOSE: 109 mg/dL (ref 70–140)
Potassium: 4 mEq/L (ref 3.5–5.1)
SODIUM: 143 meq/L (ref 136–145)
TOTAL PROTEIN: 6.7 g/dL (ref 6.4–8.3)

## 2016-07-15 LAB — PSA: Prostate Specific Ag, Serum: <0.1 ng/mL (ref 0.0–4.0)

## 2016-07-16 ENCOUNTER — Telehealth: Payer: Self-pay | Admitting: Oncology

## 2016-07-16 ENCOUNTER — Ambulatory Visit (HOSPITAL_BASED_OUTPATIENT_CLINIC_OR_DEPARTMENT_OTHER): Payer: Medicare Other | Admitting: Oncology

## 2016-07-16 VITALS — BP 151/101 | HR 51 | Temp 97.6°F | Resp 16 | Ht 65.0 in | Wt 189.8 lb

## 2016-07-16 DIAGNOSIS — C61 Malignant neoplasm of prostate: Secondary | ICD-10-CM

## 2016-07-16 DIAGNOSIS — D751 Secondary polycythemia: Secondary | ICD-10-CM

## 2016-07-16 DIAGNOSIS — E291 Testicular hypofunction: Secondary | ICD-10-CM | POA: Diagnosis not present

## 2016-07-16 NOTE — Progress Notes (Signed)
Hematology and Oncology Follow Up Visit  Tony [name] [id number] [birth year] 75 y.o. 07/16/2016 8:27 AM Tony [name], [name], Tony So, MD   Principle Diagnosis: 75 year old gentleman with prostate cancer diagnosed in May 2015. His PSA was 15.7, Gleason score is 4+5 = 9 with 12 out of 12 cores involved. His initial clinical staging is T3a N0. His metastatic workup was on revealing.   Prior Therapy: He is status post radiation therapy for external beam radiation completed in September 2015 for a total of 45 gray. He also status post seed implant brachytherapy boost completed in October 2015. He received total of 1 year of androgen deprivation 22,015 and 2016. His PSA nadir was to 0.47 in November 2016. His PSA was 4.08 in May 2017.  Current therapy:   Androgen deprivation in the form of Lupron 22.5 mg resumed on 03/02/2016. This will be given every 3 months. Zytiga 1000 mg with prednisone started around August 24 of 2017.   Interim History: Tony [name] presents today for a follow-up visit. Since the last visit, he reports no major changes are worrisome complaints. He continues to take Zytiga without any new side effects. He does not report any nausea, abdominal pain or peripheral neuropathy. He did not report any lower extremity edema. He reports occasional hot flashes. He denied any lower extremity edema or excessive fatigue. He has no difficulties obtaining this medication.  His appetite remained at baseline and his weight is stable. He continues to attempt activities of daily living without any decline in his quality of life. . He does not report any headaches blurred vision or double vision. He does not report any fevers, chills or sweats. He does not report any chest pain shortness of breath cough or hemoptysis. Does not report any palpitation orthopnea or PND.  He does not report any nausea, vomiting, abdominal pain, or satiety or change in his bowel habits. He does not report any  musca skeletal complaints. He does not report any skin rashes or lymphadenopathy. Rest of his review of systems unremarkable.   Medications: I have reviewed the patient's current medications.  Current Outpatient Prescriptions  Medication Sig Dispense Refill  . fluticasone (FLONASE) 50 MCG/ACT nasal spray Place 1 spray into both nostrils daily as needed for allergies or rhinitis.    [name] LORazepam (ATIVAN) 1 MG tablet Take 0.5-1 tablets (0.5-1 mg total) by mouth daily as needed for anxiety. for anxiety 30 tablet 1  . Multiple Vitamin (MULTIVITAMIN WITH MINERALS) TABS tablet Take 1 tablet by mouth daily.    . naproxen sodium (ANAPROX) 220 MG tablet Take 220 mg by mouth 2 (two) times daily as needed (for soreness).     . predniSONE (DELTASONE) 5 MG tablet TAKE 1 TABLET BY MOUTH DAILY WITH BREAKFAST 60 tablet 0  . traZODone (DESYREL) 50 MG tablet TAKE 1 TABLET AT BEDTIME AS NEEDED 30 tablet 5  . ZYTIGA 250 MG tablet TAKE 4 TABLETS BY MOUTH DAILY. TAKE ON AN EMPTY STOMACH 1 HOUR BEFORE OR 2 HOURS AFTER A MEAL 120 tablet 0   No current facility-administered medications for this visit.      Allergies:  Allergies  Allergen Reactions  . Penicillins Rash    Past Medical History, Surgical history, Social history, and Family History were reviewed and updated.   Physical Exam: Blood pressure (!) 151/101, pulse (!) 51, temperature 97.6 F (36.4 C), temperature source Oral, resp. rate 16, height 5\' 5"  (1.651 m), weight 189 lb 12.8 oz (86.1 kg), SpO2 95 %.  ECOG: 1 General appearance: Alert, awake gentleman without distress. Head: Normocephalic, without obvious abnormality no oral ulcers or lesions. Neck: no adenopathy Lymph nodes: Cervical, supraclavicular, and axillary nodes normal. Heart:regular rate and rhythm, S1, S2 normal, no murmur, click, rub or gallop Lung:chest clear, no wheezing, rales, normal symmetric air entry Abdomin: soft, non-tender, without masses or organomegaly no rebound or  guarding. EXT:no erythema, induration, or nodules   Lab Results: Lab Results  Component Value Date   WBC 6.2 07/14/2016   HGB 15.7 07/14/2016   HCT 47.7 07/14/2016   MCV 94.6 07/14/2016   PLT 150 07/14/2016     Chemistry      Component Value Date/Time   NA 143 07/14/2016 0756   K 4.0 07/14/2016 0756   CL 103 06/22/2014 0945   CO2 28 07/14/2016 0756   BUN 13.6 07/14/2016 0756   CREATININE 0.8 07/14/2016 0756   GLU 105 01/10/2016      Component Value Date/Time   CALCIUM 9.1 07/14/2016 0756   ALKPHOS 78 07/14/2016 0756   AST 49 (H) 07/14/2016 0756   ALT 54 07/14/2016 0756   BILITOT 0.85 07/14/2016 0756      Results for Tony [name] (MRN [id number]) as of 07/16/2016 08:03  Ref. Range 04/14/2016 08:55 06/03/2016 09:11 07/14/2016 07:56  PSA Latest Ref Range: 0.0 - 4.0 ng/mL 7.0 (H) <0.1 <0.1     Impression and Plan:   74 year old gentleman with the following issues:  1. Prostate cancer diagnosed in May 2015. He had a PSA of 15.6 and a Gleason score 4+5 = 9. He received local therapy with external beam radiation as well as seed implants boost completed in October 2015. His PSA nadir was to 0.17 in July 2016. PSA increased up to 6.2 with a testosterone of 697 in June 2017.  CT scan and bone scan obtained on 02/26/2016 showed no measurable disease.  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   He started [place] in August 2017 and continues to tolerate it well. He denied any complications and willing to continue at this time. His PSA remains close to 0 without any signs of cancer progression.  2. Androgen depravation: He will receive Lupron 22.5 mg injection every 3 months. Repeat Lupron will be given on 09/02/2016.  3. Polycythemia: Hemoglobin seems to be improving after he decrease his smoking.  4. Follow-up: Will be in 6 weeks to check his PSA.   [facility], MD 11/2/20178:27 AM

## 2016-07-16 NOTE — Telephone Encounter (Signed)
Appointments scheduled per 07/16/16 los. AVS report and appointment schedule given to patient, per 07/16/16 los. °

## 2016-07-28 ENCOUNTER — Other Ambulatory Visit: Payer: Self-pay | Admitting: *Deleted

## 2016-07-28 MED ORDER — PREDNISONE 5 MG PO TABS: 5.0000 mg | ORAL_TABLET | Freq: Every day | ORAL | 0 refills | Status: DC

## 2016-07-29 ENCOUNTER — Other Ambulatory Visit: Payer: Self-pay | Admitting: Oncology

## 2016-07-29 MED FILL — ZYTIGA 250 MG TABLET: 250 | 30 days supply | Qty: 120 | Fill #0

## 2016-08-31 ENCOUNTER — Other Ambulatory Visit (HOSPITAL_BASED_OUTPATIENT_CLINIC_OR_DEPARTMENT_OTHER): Payer: Medicare Other

## 2016-08-31 DIAGNOSIS — C61 Malignant neoplasm of prostate: Secondary | ICD-10-CM

## 2016-08-31 LAB — COMPREHENSIVE METABOLIC PANEL
ALBUMIN: 3.6 g/dL (ref 3.5–5.0)
ALK PHOS: 71 U/L (ref 40–150)
ALT: 52 U/L (ref 0–55)
ANION GAP: 9 meq/L (ref 3–11)
AST: 39 U/L — AB (ref 5–34)
BILIRUBIN TOTAL: 0.75 mg/dL (ref 0.20–1.20)
BUN: 12.5 mg/dL (ref 7.0–26.0)
CALCIUM: 9.5 mg/dL (ref 8.4–10.4)
CO2: 28 mEq/L (ref 22–29)
Chloride: 106 mEq/L (ref 98–109)
Creatinine: 0.8 mg/dL (ref 0.7–1.3)
EGFR: 88 mL/min/{1.73_m2} — ABNORMAL LOW (ref >90)
Glucose: 118 mg/dl (ref 70–140)
Potassium: 4.1 mEq/L (ref 3.5–5.1)
Sodium: 144 mEq/L (ref 136–145)
TOTAL PROTEIN: 6.7 g/dL (ref 6.4–8.3)

## 2016-08-31 LAB — CBC WITH DIFFERENTIAL/PLATELET
BASO%: 0.3 % (ref 0.0–2.0)
BASOS ABS: 0 10*3/uL (ref 0.0–0.1)
EOS%: 1.5 % (ref 0.0–7.0)
Eosinophils Absolute: 0.1 10*3/uL (ref 0.0–0.5)
HEMATOCRIT: 45.3 % (ref 38.4–49.9)
HGB: 15.4 g/dL (ref 13.0–17.1)
LYMPH#: 1 10*3/uL (ref 0.9–3.3)
LYMPH%: 15.1 % (ref 14.0–49.0)
MCH: 32.2 pg (ref 27.2–33.4)
MCHC: 34 g/dL (ref 32.0–36.0)
MCV: 94.6 fL (ref 79.3–98.0)
MONO#: 0.5 10*3/uL (ref 0.1–0.9)
MONO%: 7.6 % (ref 0.0–14.0)
NEUT#: 5.1 10*3/uL (ref 1.5–6.5)
NEUT%: 75.5 % — AB (ref 39.0–75.0)
Platelets: 144 10*3/uL (ref 140–400)
RBC: 4.79 10*6/uL (ref 4.20–5.82)
RDW: 13.1 % (ref 11.0–14.6)
WBC: 6.7 10*3/uL (ref 4.0–10.3)

## 2016-09-01 ENCOUNTER — Other Ambulatory Visit: Payer: Self-pay | Admitting: Oncology

## 2016-09-01 LAB — PSA

## 2016-09-01 LAB — TESTOSTERONE

## 2016-09-02 ENCOUNTER — Other Ambulatory Visit: Payer: Self-pay | Admitting: *Deleted

## 2016-09-02 ENCOUNTER — Telehealth: Payer: Self-pay | Admitting: Oncology

## 2016-09-02 ENCOUNTER — Ambulatory Visit (HOSPITAL_BASED_OUTPATIENT_CLINIC_OR_DEPARTMENT_OTHER): Payer: Medicare Other | Admitting: Oncology

## 2016-09-02 ENCOUNTER — Ambulatory Visit (HOSPITAL_BASED_OUTPATIENT_CLINIC_OR_DEPARTMENT_OTHER): Payer: Medicare Other

## 2016-09-02 VITALS — BP 156/98 | HR 72 | Temp 98.6°F | Resp 18 | Ht 65.0 in | Wt 193.2 lb

## 2016-09-02 DIAGNOSIS — Z5111 Encounter for antineoplastic chemotherapy: Secondary | ICD-10-CM

## 2016-09-02 DIAGNOSIS — E291 Testicular hypofunction: Secondary | ICD-10-CM | POA: Diagnosis not present

## 2016-09-02 DIAGNOSIS — D751 Secondary polycythemia: Secondary | ICD-10-CM | POA: Diagnosis not present

## 2016-09-02 DIAGNOSIS — C61 Malignant neoplasm of prostate: Secondary | ICD-10-CM

## 2016-09-02 MED ORDER — ABIRATERONE ACETATE 250 MG PO TABS: 1000.0000 mg | ORAL_TABLET | Freq: Every day | ORAL | 0 refills | Status: DC

## 2016-09-02 MED ORDER — LEUPROLIDE ACETATE (4 MONTH) 30 MG IM KIT
22.5000 mg | PACK | Freq: Once | INTRAMUSCULAR | Status: AC
  Administered 2016-09-02: 22.5 mg via INTRAMUSCULAR
  Filled 2016-09-02: qty 30

## 2016-09-02 MED FILL — ZYTIGA 250 MG TABLET: 250 | 30 days supply | Qty: 120 | Fill #0

## 2016-09-02 NOTE — Patient Instructions (Signed)
Leuprolide depot injection What is this medicine? LEUPROLIDE (loo PROE lide) is a man-made protein that acts like a natural hormone in the body. It decreases testosterone in men and decreases estrogen in women. In men, this medicine is used to treat advanced prostate cancer. In women, some forms of this medicine may be used to treat endometriosis, uterine fibroids, or other male hormone-related problems. This medicine may be used for other purposes; ask your health care provider or pharmacist if you have questions. COMMON BRAND NAME(S): Eligard, Lupron Depot, Lupron Depot-Ped, Viadur What should I tell my health care provider before I take this medicine? They need to know if you have any of these conditions: -diabetes -heart disease or previous heart attack -high blood pressure -high cholesterol -mental illness -osteoporosis -pain or difficulty passing urine -seizures -spinal cord metastasis -stroke -suicidal thoughts, plans, or attempt; a previous suicide attempt by you or a family member -tobacco smoker -unusual vaginal bleeding (women) -an unusual or allergic reaction to leuprolide, benzyl alcohol, other medicines, foods, dyes, or preservatives -pregnant or trying to get pregnant -breast-feeding How should I use this medicine? This medicine is for injection into a muscle or for injection under the skin. It is given by a health care professional in a hospital or clinic setting. The specific product will determine how it will be given to you. Make sure you understand which product you receive and how often you will receive it. Talk to your pediatrician regarding the use of this medicine in children. Special care may be needed. Overdosage: If you think you have taken too much of this medicine contact a poison control center or emergency room at once. NOTE: This medicine is only for you. Do not share this medicine with others. What if I miss a dose? It is important not to miss a dose.  Call your doctor or health care professional if you are unable to keep an appointment. Depot injections: Depot injections are given either once-monthly, every 12 weeks, every 16 weeks, or every 24 weeks depending on the product you are prescribed. The product you are prescribed will be based on if you are male or male, and your condition. Make sure you understand your product and dosing. What may interact with this medicine? Do not take this medicine with any of the following medications: -chasteberry This medicine may also interact with the following medications: -herbal or dietary supplements, like black cohosh or DHEA -male hormones, like estrogens or progestins and birth control pills, patches, rings, or injections -male hormones, like testosterone This list may not describe all possible interactions. Give your health care provider a list of all the medicines, herbs, non-prescription drugs, or dietary supplements you use. Also tell them if you smoke, drink alcohol, or use illegal drugs. Some items may interact with your medicine. What should I watch for while using this medicine? Visit your doctor or health care professional for regular checks on your progress. During the first weeks of treatment, your symptoms may get worse, but then will improve as you continue your treatment. You may get hot flashes, increased bone pain, increased difficulty passing urine, or an aggravation of nerve symptoms. Discuss these effects with your doctor or health care professional, some of them may improve with continued use of this medicine. Male patients may experience a menstrual cycle or spotting during the first months of therapy with this medicine. If this continues, contact your doctor or health care professional. What side effects may I notice from receiving this medicine? Side   effects that you should report to your doctor or health care professional as soon as possible: -allergic reactions like skin  rash, itching or hives, swelling of the face, lips, or tongue -breathing problems -chest pain -depression or memory disorders -pain in your legs or groin -pain at site where injected or implanted -severe headache -swelling of the feet and legs -visual changes -vomiting Side effects that usually do not require medical attention (report to your doctor or health care professional if they continue or are bothersome): -breast swelling or tenderness -decrease in sex drive or performance -diarrhea -hot flashes -loss of appetite -muscle, joint, or bone pains -nausea -redness or irritation at site where injected or implanted -skin problems or acne This list may not describe all possible side effects. Call your doctor for medical advice about side effects. You may report side effects to FDA at 1-800-FDA-1088. Where should I keep my medicine? This drug is given in a hospital or clinic and will not be stored at home. NOTE: This sheet is a summary. It may not cover all possible information. If you have questions about this medicine, talk to your doctor, pharmacist, or health care provider.  2017 Elsevier/Gold Standard (2016-02-13 09:45:17)  

## 2016-09-02 NOTE — Progress Notes (Signed)
Hematology and Oncology Follow Up Visit  Samyak Gielow [id number] Nov 26, 1940 75 y.o. 09/02/2016 9:44 AM [name], [name], [name], MD   Principle Diagnosis: 75 year old gentleman with prostate cancer diagnosed in May 2015. His PSA was 15.7, Gleason score is 4+5 = 9 with 12 out of 12 cores involved. His initial clinical staging is T3a N0. His metastatic workup was on revealing.   Prior Therapy: He is status post radiation therapy for external beam radiation completed in September 2015 for a total of 45 gray. He also status post seed implant brachytherapy boost completed in October 2015. He received total of 1 year of androgen deprivation 22,015 and 2016. His PSA nadir was to 0.47 in November 2016. His PSA was 4.08 in May 2017.  Current therapy:   Androgen deprivation in the form of Lupron 22.5 mg resumed on 03/02/2016. This will be given every 3 months. Zytiga 1000 mg with prednisone started around August 24 of 2017.   Interim History: Mr. Leto presents today for a follow-up visit. Since the last visit, he continues to do very well without any recent changes or complaints. He continues to take Zytiga without any new side effects. He does not report any nausea, abdominal pain or peripheral neuropathy. He did not report any lower extremity edema. He reports occasional hot flashes. His appetite remained at baseline and his weight is stable. He continues to attempt activities of daily living without any decline in his quality of life. He does record his blood pressure at home on a regular basis and has been within normal range. [name] He does not report any headaches blurred vision or double vision. He does not report any fevers, chills or sweats. He does not report any chest pain shortness of breath cough or hemoptysis. Does not report any palpitation orthopnea or PND.  He does not report any nausea, vomiting, abdominal pain, or satiety or change in his bowel habits. He does not report any  musca skeletal complaints. He does not report any skin rashes or lymphadenopathy. Rest of his review of systems unremarkable.   Medications: I have reviewed the patient's current medications.  Current Outpatient Prescriptions  Medication Sig Dispense Refill  . fluticasone (FLONASE) 50 MCG/ACT nasal spray Place 1 spray into both nostrils daily as needed for allergies or rhinitis.    [name] LORazepam (ATIVAN) 1 MG tablet Take 0.5-1 tablets (0.5-1 mg total) by mouth daily as needed for anxiety. for anxiety 30 tablet 1  . Multiple Vitamin (MULTIVITAMIN WITH MINERALS) TABS tablet Take 1 tablet by mouth daily.    . naproxen sodium (ANAPROX) 220 MG tablet Take 220 mg by mouth 2 (two) times daily as needed (for soreness).     . predniSONE (DELTASONE) 5 MG tablet Take 1 tablet (5 mg total) by mouth daily with breakfast. 90 tablet 0  . traZODone (DESYREL) 50 MG tablet TAKE 1 TABLET AT BEDTIME AS NEEDED 30 tablet 5  . abiraterone Acetate (ZYTIGA) 250 MG tablet Take 4 tablets (1,000 mg total) by mouth daily. Take on an empty stomach 1 hour before or 2 hours after a meal 120 tablet 0   No current facility-administered medications for this visit.      Allergies:  Allergies  Allergen Reactions  . Penicillins Rash    Past Medical History, Surgical history, Social history, and Family History were reviewed and updated.   Physical Exam: Blood pressure (!) 156/98, pulse 72, temperature 98.6 F (37 C), temperature source Oral, resp. rate 18, height 5'  5" (1.651 m), weight 193 lb 3.2 oz (87.6 kg), SpO2 93 %. ECOG: 1 General appearance: Well-appearing gentleman without distress. Head: Normocephalic, without obvious abnormality no oral thrush noted. Neck: no adenopathy Lymph nodes: Cervical, supraclavicular, and axillary nodes normal. Heart:regular rate and rhythm, S1, S2 normal, no murmur, click, rub or gallop Lung:chest clear, no wheezing, rales, normal symmetric air entry Abdomin: soft, non-tender, without  masses or organomegaly no shifting dullness or ascites. EXT:no erythema, induration, or nodules   Lab Results: Lab Results  Component Value Date   WBC 6.7 08/31/2016   HGB 15.4 08/31/2016   HCT 45.3 08/31/2016   MCV 94.6 08/31/2016   PLT 144 08/31/2016     Chemistry      Component Value Date/Time   NA 144 08/31/2016 0800   K 4.1 08/31/2016 0800   CL 103 06/22/2014 0945   CO2 28 08/31/2016 0800   BUN 12.5 08/31/2016 0800   CREATININE 0.8 08/31/2016 0800   GLU 105 01/10/2016      Component Value Date/Time   CALCIUM 9.5 08/31/2016 0800   ALKPHOS 71 08/31/2016 0800   AST 39 (H) 08/31/2016 0800   ALT 52 08/31/2016 0800   BILITOT 0.75 08/31/2016 0800      Results for ROBBEN, OELSCHLAGER (MRN [id number]) as of 09/02/2016 09:36  Ref. Range 08/31/2016 08:00  PSA Latest Ref Range: 0.0 - 4.0 ng/mL <0.1   Results for PRAGYAN, CRITELLI (MRN [id number]) as of 09/02/2016 09:36  Ref. Range 08/31/2016 08:00  Testosterone Latest Ref Range: 264 - 916 ng/dL <3 (L)     Impression and Plan:   75 year old gentleman with the following issues:  1. Prostate cancer diagnosed in May 2015. He had a PSA of 15.6 and a Gleason score 4+5 = 9. He received local therapy with external beam radiation as well as seed implants boost completed in October 2015. His PSA nadir was to 0.17 in July 2016. PSA increased up to 6.2 with a testosterone of 697 in June 2017.  CT scan and bone scan obtained on 02/26/2016 showed no measurable disease.  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   He started [place] in August 2017 and continues to tolerate it well.   His PSA remains close to 0 with castrate level testosterone. Risks and benefits of continuing Zytiga at this time were reviewed and he is agreeable to continue.  2. Androgen depravation: He will receive Lupron 22.5 mg injection every 3 months. He is due for this injection on 09/02/2016 and will be repeated  3 months after that.  3. Polycythemia: Hemoglobin seems to be improving after he decrease his smoking.  4. Follow-up: Will be in 6 weeks to check his PSA.   [facility], MD 12/20/20179:44 AM

## 2016-09-02 NOTE — Telephone Encounter (Signed)
Gave patient avs report and appointments for January  °

## 2016-09-28 MED FILL — ZYTIGA 250 MG TABLET: 250 | 14 days supply | Qty: 56 | Fill #0

## 2016-10-07 ENCOUNTER — Telehealth: Payer: Self-pay | Admitting: Pharmacist

## 2016-10-07 NOTE — Telephone Encounter (Signed)
Oral Chemotherapy Pharmacist Encounter  Successfully enrolled patient for copayment assistance grant from the Patient [facility]) for help with Zytiga costs.  Award amount: $[id number] Award dates: 10/07/16-10/06/17 ID: [id number] BIN: [id number] PCN: [id number] Group: [id number]  I called patient and spoke to his wife, [name], to alert her of the good news. She expressed appreciation. I will forward this billing information to [facility] to place on patient's chart.  Johny Drilling, PharmD, BCPS, BCOP 10/07/2016  8:25 AM Oral Oncology Clinic [phone]

## 2016-10-12 ENCOUNTER — Other Ambulatory Visit (HOSPITAL_BASED_OUTPATIENT_CLINIC_OR_DEPARTMENT_OTHER): Payer: Medicare Other

## 2016-10-12 DIAGNOSIS — C61 Malignant neoplasm of prostate: Secondary | ICD-10-CM

## 2016-10-12 LAB — CBC WITH DIFFERENTIAL/PLATELET
BASO%: 0.6 % (ref 0.0–2.0)
BASOS ABS: 0 10*3/uL (ref 0.0–0.1)
EOS%: 2.1 % (ref 0.0–7.0)
Eosinophils Absolute: 0.2 10*3/uL (ref 0.0–0.5)
HEMATOCRIT: 47.2 % (ref 38.4–49.9)
HEMOGLOBIN: 16.3 g/dL (ref 13.0–17.1)
LYMPH#: 1.3 10*3/uL (ref 0.9–3.3)
LYMPH%: 16.3 % (ref 14.0–49.0)
MCH: 32.8 pg (ref 27.2–33.4)
MCHC: 34.5 g/dL (ref 32.0–36.0)
MCV: 94.9 fL (ref 79.3–98.0)
MONO#: 0.6 10*3/uL (ref 0.1–0.9)
MONO%: 7.5 % (ref 0.0–14.0)
NEUT#: 5.6 10*3/uL (ref 1.5–6.5)
NEUT%: 73.5 % (ref 39.0–75.0)
Platelets: 153 10*3/uL (ref 140–400)
RBC: 4.97 10*6/uL (ref 4.20–5.82)
RDW: 13.1 % (ref 11.0–14.6)
WBC: 7.7 10*3/uL (ref 4.0–10.3)

## 2016-10-12 LAB — COMPREHENSIVE METABOLIC PANEL
ALBUMIN: 3.9 g/dL (ref 3.5–5.0)
ALT: 28 U/L (ref 0–55)
AST: 28 U/L (ref 5–34)
Alkaline Phosphatase: 69 U/L (ref 40–150)
Anion Gap: 10 mEq/L (ref 3–11)
BILIRUBIN TOTAL: 0.95 mg/dL (ref 0.20–1.20)
BUN: 15.2 mg/dL (ref 7.0–26.0)
CO2: 23 meq/L (ref 22–29)
Calcium: 9.4 mg/dL (ref 8.4–10.4)
Chloride: 107 mEq/L (ref 98–109)
Creatinine: 0.8 mg/dL (ref 0.7–1.3)
EGFR: 87 mL/min/{1.73_m2} — AB (ref >90)
GLUCOSE: 90 mg/dL (ref 70–140)
Potassium: 3.7 mEq/L (ref 3.5–5.1)
SODIUM: 140 meq/L (ref 136–145)
TOTAL PROTEIN: 6.7 g/dL (ref 6.4–8.3)

## 2016-10-13 LAB — PSA

## 2016-10-14 ENCOUNTER — Other Ambulatory Visit: Payer: Self-pay | Admitting: *Deleted

## 2016-10-14 ENCOUNTER — Telehealth: Payer: Self-pay | Admitting: Oncology

## 2016-10-14 ENCOUNTER — Ambulatory Visit (HOSPITAL_BASED_OUTPATIENT_CLINIC_OR_DEPARTMENT_OTHER): Payer: Medicare Other | Admitting: Oncology

## 2016-10-14 VITALS — BP 158/95 | HR 63 | Temp 97.4°F | Resp 18 | Ht 65.0 in | Wt 200.1 lb

## 2016-10-14 DIAGNOSIS — C61 Malignant neoplasm of prostate: Secondary | ICD-10-CM | POA: Diagnosis not present

## 2016-10-14 DIAGNOSIS — D751 Secondary polycythemia: Secondary | ICD-10-CM

## 2016-10-14 DIAGNOSIS — E291 Testicular hypofunction: Secondary | ICD-10-CM | POA: Diagnosis not present

## 2016-10-14 MED ORDER — ABIRATERONE ACETATE 250 MG PO TABS: 1000.0000 mg | ORAL_TABLET | Freq: Every day | ORAL | 0 refills | Status: DC

## 2016-10-14 MED FILL — ZYTIGA 250 MG TABLET: 250 | 30 days supply | Qty: 120 | Fill #0

## 2016-10-14 NOTE — Telephone Encounter (Signed)
Appointments scheduled per 10/14/16 los. Patient was given a copy of the AVS report and appointment schedule, per 10/14/16 los. ° °

## 2016-10-14 NOTE — Progress Notes (Signed)
Hematology and Oncology Follow Up Visit  Balam Mercedes [id number] Dec 11, 1940 76 y.o. 10/14/2016 9:56 AM [name], [name], [name], MD   Principle Diagnosis: 76 year old gentleman with prostate cancer diagnosed in May 2015. His PSA was 15.7, Gleason score is 4+5 = 9 with 12 out of 12 cores involved. His initial clinical staging is T3a N0. His metastatic workup was on revealing.   Prior Therapy: He is status post radiation therapy for external beam radiation completed in September 2015 for a total of 45 gray. He also status post seed implant brachytherapy boost completed in October 2015. He received total of 1 year of androgen deprivation 22,015 and 2016. His PSA nadir was to 0.47 in November 2016. His PSA was 4.08 in May 2017.  Current therapy:   Androgen deprivation in the form of Lupron 22.5 mg resumed on 03/02/2016. This will be given every 3 months. Zytiga 1000 mg with prednisone started around August 24 of 2017.   Interim History: Mr. Malden presents today for a follow-up visit. Since the last visit, he reports no major changes in his health. He denied any recent hospitalization or illnesses. He denied any applications related to Zytiga. He does not report any nausea, abdominal pain or peripheral neuropathy. He did not report any lower extremity edema. He reports occasional hot flashes. His appetite remains excellent and have gained weight. He continues to perform activities of daily living without any decline in his quality of life.  . He does not report any headaches blurred vision or double vision. He does not report any fevers, chills or sweats. He does not report any chest pain shortness of breath cough or hemoptysis. Does not report any palpitation orthopnea or PND.  He does not report any nausea, vomiting, abdominal pain, or satiety or change in his bowel habits. He does not report any musca skeletal complaints. He does not report any skin rashes or lymphadenopathy. Rest of  his review of systems unremarkable.   Medications: I have reviewed the patient's current medications.  Current Outpatient Prescriptions  Medication Sig Dispense Refill  . fluticasone (FLONASE) 50 MCG/ACT nasal spray Place 1 spray into both nostrils daily as needed for allergies or rhinitis.    [name] LORazepam (ATIVAN) 1 MG tablet Take 0.5-1 tablets (0.5-1 mg total) by mouth daily as needed for anxiety. for anxiety 30 tablet 1  . Multiple Vitamin (MULTIVITAMIN WITH MINERALS) TABS tablet Take 1 tablet by mouth daily.    . naproxen sodium (ANAPROX) 220 MG tablet Take 220 mg by mouth 2 (two) times daily as needed (for soreness).     . predniSONE (DELTASONE) 5 MG tablet Take 1 tablet (5 mg total) by mouth daily with breakfast. 90 tablet 0  . traZODone (DESYREL) 50 MG tablet TAKE 1 TABLET AT BEDTIME AS NEEDED 30 tablet 5  . abiraterone Acetate (ZYTIGA) 250 MG tablet Take 4 tablets (1,000 mg total) by mouth daily. Take on an empty stomach 1 hour before or 2 hours after a meal 120 tablet 0   No current facility-administered medications for this visit.      Allergies:  Allergies  Allergen Reactions  . Penicillins Rash    Past Medical History, Surgical history, Social history, and Family History were reviewed and updated.   Physical Exam: Blood pressure (!) 158/95, pulse 63, temperature 97.4 F (36.3 C), temperature source Oral, resp. rate 18, height 5\' 5"  (1.651 m), weight 200 lb 1.6 oz (90.8 kg), SpO2 97 %. ECOG: 1 General appearance: Alert, awake  gentleman without distress. Head: Normocephalic, without obvious abnormality no oral thrush noted. Neck: no adenopathy Lymph nodes: Cervical, supraclavicular, and axillary nodes normal. Heart:regular rate and rhythm, S1, S2 normal, no murmur, click, rub or gallop Lung:chest clear, no wheezing, rales, normal symmetric air entry Abdomin: soft, non-tender, without masses or organomegaly no rebound or guarding. EXT:no erythema, induration, or  nodules   Lab Results: Lab Results  Component Value Date   WBC 7.7 10/12/2016   HGB 16.3 10/12/2016   HCT 47.2 10/12/2016   MCV 94.9 10/12/2016   PLT 153 10/12/2016     Chemistry      Component Value Date/Time   NA 140 10/12/2016 0746   K 3.7 10/12/2016 0746   CL 103 06/22/2014 0945   CO2 23 10/12/2016 0746   BUN 15.2 10/12/2016 0746   CREATININE 0.8 10/12/2016 0746   GLU 105 01/10/2016      Component Value Date/Time   CALCIUM 9.4 10/12/2016 0746   ALKPHOS 69 10/12/2016 0746   AST 28 10/12/2016 0746   ALT 28 10/12/2016 0746   BILITOT 0.95 10/12/2016 0746      Results for CARLOSDANIEL, OVERDORF (MRN [id number]) as of 10/14/2016 09:58  Ref. Range 07/14/2016 07:56 08/31/2016 08:00 10/12/2016 07:46  PSA Latest Ref Range: 0.0 - 4.0 ng/mL <0.1 <0.1 <0.1     Impression and Plan:   76 year old gentleman with the following issues:  1. Prostate cancer diagnosed in May 2015. He had a PSA of 15.6 and a Gleason score 4+5 = 9. He received local therapy with external beam radiation as well as seed implants boost completed in October 2015. His PSA nadir was to 0.17 in July 2016. PSA increased up to 6.2 with a testosterone of 697 in June 2017.  CT scan and bone scan obtained on 02/26/2016 showed no measurable disease.  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   He started [place] in August 2017 and continues to tolerate it well.   He continues to have an excellent clinical and PSA response on this medication. The plan is to continue with the same dose and schedule and repeat his PSA in 6 weeks. His last PSA continues to be close to 0.  2. Androgen depravation: He will receive Lupron 22.5 mg injection every 3 months. He is due for this injection on 12/01/2016.  3. Polycythemia: Hemoglobin seems to be improving after he decrease his smoking.  4. Follow-up: Will be in 6 weeks to check his PSA.   [facility], MD 1/31/20189:56 AM

## 2016-10-19 ENCOUNTER — Telehealth: Payer: Self-pay | Admitting: Family Medicine

## 2016-10-19 DIAGNOSIS — F418 Other specified anxiety disorders: Secondary | ICD-10-CM

## 2016-10-19 MED ORDER — LORAZEPAM 1 MG PO TABS: 0.5000 mg | ORAL_TABLET | Freq: Every day | ORAL | 0 refills | Status: DC | PRN

## 2016-10-19 NOTE — Telephone Encounter (Signed)
Refill okay?  

## 2016-10-19 NOTE — Telephone Encounter (Signed)
Rx called in.   Patient has an appt on 12/10/16.

## 2016-10-19 NOTE — Telephone Encounter (Signed)
Pt need new Rx for lorazepam   Pharm: [facility] in [place]

## 2016-10-19 NOTE — Telephone Encounter (Signed)
He may have a refill available, if not Lorazepam 1 mg can be called in to continue 1/2 tab daily as needed. # 30/0. Please ask Tony [name] to follow in 12/2016 as we planned last OV for anxiety  Thanks, BJ

## 2016-10-28 ENCOUNTER — Telehealth: Payer: Self-pay

## 2016-10-28 NOTE — Telephone Encounter (Signed)
..  Oral Chemotherapy Pharmacist Encounter  Received faxed notification from [employer] on the status of Mr Bullin's Zytiga supplied through patient assistance.  He has been Denied to receive free medication through the assistance program.  The patient has been notified by [employer] as to their decision.  He has however been awarded copay assistance that will hopefully alleviate the expense he incurs.   Thank you,  Henreitta Leber, PharmD Oral Chemotherapy Clinic

## 2016-11-16 ENCOUNTER — Other Ambulatory Visit: Payer: Self-pay | Admitting: Oncology

## 2016-11-16 MED FILL — ZYTIGA 250 MG TABLET: 250 | 30 days supply | Qty: 120 | Fill #0

## 2016-11-27 ENCOUNTER — Other Ambulatory Visit: Payer: Self-pay | Admitting: *Deleted

## 2016-11-27 ENCOUNTER — Other Ambulatory Visit (HOSPITAL_BASED_OUTPATIENT_CLINIC_OR_DEPARTMENT_OTHER): Payer: Medicare Other

## 2016-11-27 ENCOUNTER — Encounter: Payer: Self-pay | Admitting: *Deleted

## 2016-11-27 DIAGNOSIS — C61 Malignant neoplasm of prostate: Secondary | ICD-10-CM | POA: Diagnosis not present

## 2016-11-27 LAB — COMPREHENSIVE METABOLIC PANEL
ALBUMIN: 3.9 g/dL (ref 3.5–5.0)
ALK PHOS: 69 U/L (ref 40–150)
ALT: 29 U/L (ref 0–55)
AST: 28 U/L (ref 5–34)
Anion Gap: 9 mEq/L (ref 3–11)
BILIRUBIN TOTAL: 0.93 mg/dL (ref 0.20–1.20)
BUN: 18.2 mg/dL (ref 7.0–26.0)
CO2: 25 meq/L (ref 22–29)
Calcium: 9.5 mg/dL (ref 8.4–10.4)
Chloride: 106 mEq/L (ref 98–109)
Creatinine: 0.9 mg/dL (ref 0.7–1.3)
EGFR: 85 mL/min/{1.73_m2} — AB (ref >90)
GLUCOSE: 91 mg/dL (ref 70–140)
POTASSIUM: 3.9 meq/L (ref 3.5–5.1)
SODIUM: 140 meq/L (ref 136–145)
TOTAL PROTEIN: 6.7 g/dL (ref 6.4–8.3)

## 2016-11-27 LAB — CBC WITH DIFFERENTIAL/PLATELET
BASO%: 0.9 % (ref 0.0–2.0)
Basophils Absolute: 0.1 10*3/uL (ref 0.0–0.1)
EOS%: 2.6 % (ref 0.0–7.0)
Eosinophils Absolute: 0.2 10*3/uL (ref 0.0–0.5)
HCT: 47.3 % (ref 38.4–49.9)
HEMOGLOBIN: 16 g/dL (ref 13.0–17.1)
LYMPH%: 16.4 % (ref 14.0–49.0)
MCH: 32.2 pg (ref 27.2–33.4)
MCHC: 33.8 g/dL (ref 32.0–36.0)
MCV: 95.1 fL (ref 79.3–98.0)
MONO#: 0.5 10*3/uL (ref 0.1–0.9)
MONO%: 8.2 % (ref 0.0–14.0)
NEUT%: 71.9 % (ref 39.0–75.0)
NEUTROS ABS: 4.4 10*3/uL (ref 1.5–6.5)
Platelets: 140 10*3/uL (ref 140–400)
RBC: 4.98 10*6/uL (ref 4.20–5.82)
RDW: 12.7 % (ref 11.0–14.6)
WBC: 6.1 10*3/uL (ref 4.0–10.3)
lymph#: 1 10*3/uL (ref 0.9–3.3)

## 2016-11-27 MED ORDER — PREDNISONE 5 MG PO TABS: 5.0000 mg | ORAL_TABLET | Freq: Every day | ORAL | 0 refills | Status: DC

## 2016-11-28 LAB — PSA: Prostate Specific Ag, Serum: <0.1 ng/mL (ref 0.0–4.0)

## 2016-12-01 ENCOUNTER — Ambulatory Visit (HOSPITAL_BASED_OUTPATIENT_CLINIC_OR_DEPARTMENT_OTHER): Payer: Medicare Other | Admitting: Oncology

## 2016-12-01 ENCOUNTER — Ambulatory Visit (HOSPITAL_BASED_OUTPATIENT_CLINIC_OR_DEPARTMENT_OTHER): Payer: Medicare Other

## 2016-12-01 ENCOUNTER — Telehealth: Payer: Self-pay | Admitting: Oncology

## 2016-12-01 VITALS — BP 157/103 | HR 68 | Temp 97.8°F | Resp 20 | Wt 197.7 lb

## 2016-12-01 DIAGNOSIS — D751 Secondary polycythemia: Secondary | ICD-10-CM | POA: Diagnosis not present

## 2016-12-01 DIAGNOSIS — C61 Malignant neoplasm of prostate: Secondary | ICD-10-CM | POA: Diagnosis not present

## 2016-12-01 DIAGNOSIS — E291 Testicular hypofunction: Secondary | ICD-10-CM | POA: Diagnosis not present

## 2016-12-01 DIAGNOSIS — Z5111 Encounter for antineoplastic chemotherapy: Secondary | ICD-10-CM

## 2016-12-01 MED ORDER — LEUPROLIDE ACETATE (4 MONTH) 30 MG IM KIT
22.5000 mg | PACK | Freq: Once | INTRAMUSCULAR | Status: AC
  Administered 2016-12-01: 22.5 mg via INTRAMUSCULAR
  Filled 2016-12-01: qty 30

## 2016-12-01 NOTE — Progress Notes (Signed)
Hematology and Oncology Follow Up Visit  Tony [name] [id number] 05-08-1941 76 y.o. 12/01/2016 9:03 AM [name], [name], [name], MD   Principle Diagnosis: 76 year old gentleman with prostate cancer diagnosed in May 2015. His PSA was 15.7, Gleason score is 4+5 = 9 with 12 out of 12 cores involved. His initial clinical staging is T3a N0. His metastatic workup was on revealing.   Prior Therapy: He is status post radiation therapy for external beam radiation completed in September 2015 for a total of 45 gray. He also status post seed implant brachytherapy boost completed in October 2015. He received total of 1 year of androgen deprivation 22,015 and 2016. His PSA nadir was to 0.47 in November 2016. His PSA was 4.08 in May 2017.  Current therapy:  Androgen deprivation in the form of Lupron 22.5 mg resumed on 03/02/2016. This will be given every 3 months. Zytiga 1000 mg with prednisone started around August 24 of 2017.   Interim History: Tony [name] presents today for a follow-up visit. Since the last visit, he continues to do very well without any recent complaints. He continues to tolerate Zytiga without any new side effects. He does not report any nausea, abdominal pain or peripheral neuropathy. He did not report any lower extremity edema. He reports occasional hot flashes. His appetite remains excellent and his weight is stable. He continues to perform activities of daily living without any decline in his quality of life. He is exercising regularly now without any major difficulties. He denied any recent neurological deficits or headaches. He does report some unsteadiness at times and does require a cane but doesn't use it all the time. [name] He does not report any headaches blurred vision or double vision. He does not report any fevers, chills or sweats. He does not report any chest pain shortness of breath cough or hemoptysis. Does not report any palpitation orthopnea or PND.  He does not  report any nausea, vomiting, abdominal pain, or satiety or change in his bowel habits. He does not report any musca skeletal complaints. He does not report any skin rashes or lymphadenopathy. Rest of his review of systems unremarkable.   Medications: I have reviewed the patient's current medications.  Current Outpatient Prescriptions  Medication Sig Dispense Refill  . fluticasone (FLONASE) 50 MCG/ACT nasal spray Place 1 spray into both nostrils daily as needed for allergies or rhinitis.    [name] LORazepam (ATIVAN) 1 MG tablet Take 0.5-1 tablets (0.5-1 mg total) by mouth daily as needed for anxiety. for anxiety 30 tablet 0  . Multiple Vitamin (MULTIVITAMIN WITH MINERALS) TABS tablet Take 1 tablet by mouth daily.    . naproxen sodium (ANAPROX) 220 MG tablet Take 220 mg by mouth 2 (two) times daily as needed (for soreness).     . predniSONE (DELTASONE) 5 MG tablet Take 1 tablet (5 mg total) by mouth daily with breakfast. 90 tablet 0  . traZODone (DESYREL) 50 MG tablet TAKE 1 TABLET AT BEDTIME AS NEEDED 30 tablet 5  . ZYTIGA 250 MG tablet TAKE 4 TABLETS (1,000 MG TOTAL) BY MOUTH DAILY. TAKE ON AN EMPTY STOMACH 1 HOUR BEFORE OR 2 HOURS AFTER A MEAL 120 tablet 0   No current facility-administered medications for this visit.      Allergies:  Allergies  Allergen Reactions  . Penicillins Rash    Past Medical History, Surgical history, Social history, and Family History were reviewed and updated.   Physical Exam: Blood pressure (!) 157/103, pulse 68, temperature  97.8 F (36.6 C), temperature source Oral, resp. rate 20, weight 197 lb 11.2 oz (89.7 kg), SpO2 94 %. ECOG: 1 General appearance:Well-appearing gentleman appeared without distress. Head: Normocephalic, without obvious abnormality no oral ulcers or lesions. Neck: no adenopathy Lymph nodes: Cervical, supraclavicular, and axillary nodes normal. Heart:regular rate and rhythm, S1, S2 normal, no murmur, click, rub or gallop Lung:chest clear,  no wheezing, rales, normal symmetric air entry Abdomin: soft, non-tender, without masses or organomegaly no shifting dullness or ascites. EXT:no erythema, induration, or nodules   Lab Results: Lab Results  Component Value Date   WBC 6.1 11/27/2016   HGB 16.0 11/27/2016   HCT 47.3 11/27/2016   MCV 95.1 11/27/2016   PLT 140 11/27/2016     Chemistry      Component Value Date/Time   NA 140 11/27/2016 0743   K 3.9 11/27/2016 0743   CL 103 06/22/2014 0945   CO2 25 11/27/2016 0743   BUN 18.2 11/27/2016 0743   CREATININE 0.9 11/27/2016 0743   GLU 105 01/10/2016      Component Value Date/Time   CALCIUM 9.5 11/27/2016 0743   ALKPHOS 69 11/27/2016 0743   AST 28 11/27/2016 0743   ALT 29 11/27/2016 0743   BILITOT 0.93 11/27/2016 0743     Results for Tony [name], Tony [name] (MRN [id number]) as of 12/01/2016 08:47  Ref. Range 08/31/2016 08:00 10/12/2016 07:46 11/27/2016 07:43  PSA Latest Ref Range: 0.0 - 4.0 ng/mL <0.1 <0.1 <0.1       Impression and Plan:   76 year old gentleman with the following issues:  1. Prostate cancer diagnosed in May 2015. He had a PSA of 15.6 and a Gleason score 4+5 = 9. He received local therapy with external beam radiation as well as seed implants boost completed in October 2015. His PSA nadir was to 0.17 in July 2016. PSA increased up to 6.2 with a testosterone of 697 in June 2017.  CT scan and bone scan obtained on 02/26/2016 showed no measurable disease.  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   He started Zytiga in August 2017 with excellent response to his PSA. Risks and benefits of continuing this medication was discussed. He is agreeable to continue for the time being. I recommended continuing this medication indefinitely to progression of disease. At that time he understands that switching to a different agent might be needed. These treatments are not curable for his prostate cancer and very effective in  palliating his disease for many years.  2. Androgen depravation: He will receive Lupron 22.5 mg injection every 3 months. He'll receive his injection today and in 3 months.  3. Polycythemia: Hemoglobin seems to be improving after he decrease his smoking.  4. Follow-up: Will be in 3 months.   [facility], MD 3/20/20189:03 AM

## 2016-12-01 NOTE — Patient Instructions (Signed)
Leuprolide depot injection What is this medicine? LEUPROLIDE (loo PROE lide) is a man-made protein that acts like a natural hormone in the body. It decreases testosterone in men and decreases estrogen in women. In men, this medicine is used to treat advanced prostate cancer. In women, some forms of this medicine may be used to treat endometriosis, uterine fibroids, or other male hormone-related problems. This medicine may be used for other purposes; ask your health care provider or pharmacist if you have questions. What should I tell my health care provider before I take this medicine? They need to know if you have any of these conditions: -diabetes -heart disease or previous heart attack -high blood pressure -high cholesterol -osteoporosis -pain or difficulty passing urine -spinal cord metastasis -stroke -tobacco smoker -unusual vaginal bleeding (women) -an unusual or allergic reaction to leuprolide, benzyl alcohol, other medicines, foods, dyes, or preservatives -pregnant or trying to get pregnant -breast-feeding How should I use this medicine? This medicine is for injection into a muscle or for injection under the skin. It is given by a health care professional in a hospital or clinic setting. The specific product will determine how it will be given to you. Make sure you understand which product you receive and how often you will receive it. Talk to your pediatrician regarding the use of this medicine in children. Special care may be needed. Overdosage: If you think you have taken too much of this medicine contact a poison control center or emergency room at once. NOTE: This medicine is only for you. Do not share this medicine with others. What if I miss a dose? It is important not to miss a dose. Call your doctor or health care professional if you are unable to keep an appointment. Depot injections: Depot injections are given either once-monthly, every 12 weeks, every 16 weeks, or  every 24 weeks depending on the product you are prescribed. The product you are prescribed will be based on if you are male or male, and your condition. Make sure you understand your product and dosing. What may interact with this medicine? Do not take this medicine with any of the following medications: -chasteberry This medicine may also interact with the following medications: -herbal or dietary supplements, like black cohosh or DHEA -male hormones, like estrogens or progestins and birth control pills, patches, rings, or injections -male hormones, like testosterone This list may not describe all possible interactions. Give your health care provider a list of all the medicines, herbs, non-prescription drugs, or dietary supplements you use. Also tell them if you smoke, drink alcohol, or use illegal drugs. Some items may interact with your medicine. What should I watch for while using this medicine? Visit your doctor or health care professional for regular checks on your progress. During the first weeks of treatment, your symptoms may get worse, but then will improve as you continue your treatment. You may get hot flashes, increased bone pain, increased difficulty passing urine, or an aggravation of nerve symptoms. Discuss these effects with your doctor or health care professional, some of them may improve with continued use of this medicine. Male patients may experience a menstrual cycle or spotting during the first months of therapy with this medicine. If this continues, contact your doctor or health care professional. What side effects may I notice from receiving this medicine? Side effects that you should report to your doctor or health care professional as soon as possible: -allergic reactions like skin rash, itching or hives, swelling of the   face, lips, or tongue -breathing problems -chest pain -depression or memory disorders -pain in your legs or groin -pain at site where injected or  implanted -severe headache -swelling of the feet and legs -visual changes -vomiting Side effects that usually do not require medical attention (report to your doctor or health care professional if they continue or are bothersome): -breast swelling or tenderness -decrease in sex drive or performance -diarrhea -hot flashes -loss of appetite -muscle, joint, or bone pains -nausea -redness or irritation at site where injected or implanted -skin problems or acne This list may not describe all possible side effects. Call your doctor for medical advice about side effects. You may report side effects to FDA at 1-800-FDA-1088. Where should I keep my medicine? This drug is given in a hospital or clinic and will not be stored at home. NOTE: This sheet is a summary. It may not cover all possible information. If you have questions about this medicine, talk to your doctor, pharmacist, or health care provider.    2016, [employer]. (2014-05-25 14:16:23)  

## 2016-12-01 NOTE — Telephone Encounter (Signed)
Appointments scheduled per 12/01/16 los. Patient was given a copy of the AVS report and appointment schedule, per 12/01/16 los. °

## 2016-12-09 NOTE — Progress Notes (Signed)
HPI:   Tony [name] is a 76 y.o. male, who is here today with his wife to follow on some chronic medical problems.  I saw him last on 06/29/16. Sine his last OV her has followed with oncologists,Dr Shadad.Hx of prostate cancer,currently he is on Leuprolide depot injections.  His BP has been elevated at the oncologists's office , 157/103. He is not checking BP at home. Hx of HTN, he is on non pharmacologic treatment. He follows low salt diet and started exercising 3-4 weeks ago, going to the [facility] 3 times per week.   Anxiety: He is on Lorazepam 1 mg ,he takes 1/2 tab daily as needed. He denies depressed mood or suicidal thoughts. He is tolerating medication well,denies side effects.  Insomnia: Occasionally he takes Trazodone 50 mg at bedtime but still wakes up a couple times at night. Sleeps about 6-7 hours. He does not take naps, feels tired a"all the time." Urinary frequency and nocturia aggravate problem.  He is tolerating medications well,no side effects reported.     Chemistry      Component Value Date/Time   NA 140 11/27/2016 0743   K 3.9 11/27/2016 0743   CL 103 06/22/2014 0945   CO2 25 11/27/2016 0743   BUN 18.2 11/27/2016 0743   CREATININE 0.9 11/27/2016 0743   GLU 105 01/10/2016      Component Value Date/Time   CALCIUM 9.5 11/27/2016 0743   ALKPHOS 69 11/27/2016 0743   AST 28 11/27/2016 0743   ALT 29 11/27/2016 0743   BILITOT 0.93 11/27/2016 0743      Rash: Non pruritic and no tender erythematous rash he noted about 5 days ago.On axillary area and a few on anterior left elbow. He is not sure for how long rash has been there. No new medication or detergent,no prior Hx of similar rash.  He has not tried OTC medication. It seems to be improving.    Review of Systems  Constitutional: Positive for fatigue. Negative for appetite change, fever and unexpected weight change.  HENT: Negative for mouth sores, nosebleeds, sore throat and  trouble swallowing.   Eyes: Negative for redness and visual disturbance.  Respiratory: Negative for cough, shortness of breath and wheezing.   Cardiovascular: Negative for chest pain, palpitations and leg swelling.  Gastrointestinal: Negative for abdominal pain, nausea and vomiting.       No changes in bowel habits.  Genitourinary: Positive for frequency. Negative for decreased urine volume and hematuria.  Skin: Positive for rash. Negative for wound.  Neurological: Negative for syncope, weakness and headaches.  Psychiatric/Behavioral: Positive for sleep disturbance. Negative for confusion. The patient is nervous/anxious.       Current Outpatient Prescriptions on File Prior to Visit  Medication Sig Dispense Refill  . fluticasone (FLONASE) 50 MCG/ACT nasal spray Place 1 spray into both nostrils daily as needed for allergies or rhinitis.    [name] LORazepam (ATIVAN) 1 MG tablet Take 0.5-1 tablets (0.5-1 mg total) by mouth daily as needed for anxiety. for anxiety 30 tablet 0  . Multiple Vitamin (MULTIVITAMIN WITH MINERALS) TABS tablet Take 1 tablet by mouth daily.    . naproxen sodium (ANAPROX) 220 MG tablet Take 220 mg by mouth 2 (two) times daily as needed (for soreness).     . predniSONE (DELTASONE) 5 MG tablet Take 1 tablet (5 mg total) by mouth daily with breakfast. 90 tablet 0   No current facility-administered medications on file prior to visit.  Past Medical History:  Diagnosis Date  . Hypertension    no meds  . Prostate cancer ([place]) 02/08/14   Gleason 4+5=9, PSA 15.65  . Radiation   . Sinus problem    Allergies  Allergen Reactions  . Penicillins Rash    Social History   Social History  . Marital status: Married    Spouse name: N/A  . Number of children: N/A  . Years of education: N/A   Social History Main Topics  . Smoking status: Former Smoker    Types: Cigars  . Smokeless tobacco: Never Used  . Alcohol use Yes     Comment: 1 beer daily  . Drug use: No  .  Sexual activity: Not Currently   Other Topics Concern  . None   Social History Narrative  . None    Vitals:   12/10/16 0845  BP: 138/90  Pulse: 74  Resp: 12  O2 sat at RA 94%. Body mass index is 32.84 kg/m.   Physical Exam  Nursing note and vitals reviewed. Constitutional: He is oriented to person, place, and time. He appears well-developed. No distress.  HENT:  Head: Atraumatic.  Mouth/Throat: Oropharynx is clear and moist and mucous membranes are normal. He has dentures.  Eyes: Conjunctivae and EOM are normal. Pupils are equal, round, and reactive to light.  Cardiovascular: Normal rate.  An irregular rhythm present.  Occasional extrasystoles are present.  No murmur heard. DP pulses present bilateral.  Respiratory: Effort normal and breath sounds normal. No respiratory distress.  GI: Soft. He exhibits no mass. There is no hepatomegaly. There is no tenderness.  Musculoskeletal: He exhibits no edema or tenderness.  Lymphadenopathy:    He has no cervical adenopathy.  Neurological: He is alert and oriented to person, place, and time. He has normal strength.  Skin: Skin is warm. Rash noted. Rash is maculopapular. There is erythema.     On axillary area,bilateral,maculopapular ,confluent, erythematous rash.No tender,no local heat,no indurated. On left anterior elbow he has 2 lesions.  Psychiatric: He has a normal mood and affect. His speech is normal.  Well groomed, good eye contact.      ASSESSMENT AND PLAN:   Tony [name] was seen today for follow-up.  Diagnoses and all orders for this visit:  Insomnia, unspecified type  Trazodone not helping. He agrees with trying Doxepin 3-6 mg at bedtime.We discussed some side effects. Good sleep hygiene. Avoiding fluid intake 4-5 hours before bedtime may help with nocturia and therefore with sleep. F/U in 3 months.  -     doxepin (SINEQUAN) 10 MG/ML solution; Take 0.3-0.6 mLs (3-6 mg total) by mouth at bedtime.  Erythematous  rash  ? Contact dermatitis. Topical steroid for up to 14 days recommended. F/U as needed.  -     triamcinolone (KENALOG) 0.025 % ointment; Apply 1 application topically 2 (two) times daily.  Essential hypertension, benign  Elevated BP today,re-checked 150/90. He would like to check BP at home for a few days before antihypertensive medication is considered (Amlodipine). We discussed some pharmacologic options and some side effects. Possible complications of elevated BP discussed. Annual eye examination. Caution with NSAID's. F/U in 3-4 months.  Other specified anxiety disorders Stable. No changes in Ativan dose. Some side effects discussed.     -Tony [name] was advised to return sooner than planned today if new concerns arise.       [name]. [name], MD  [facility]. [place] office.

## 2016-12-10 ENCOUNTER — Encounter: Payer: Self-pay | Admitting: Family Medicine

## 2016-12-10 ENCOUNTER — Other Ambulatory Visit: Payer: Self-pay | Admitting: Oncology

## 2016-12-10 ENCOUNTER — Ambulatory Visit (INDEPENDENT_AMBULATORY_CARE_PROVIDER_SITE_OTHER): Payer: Medicare Other | Admitting: Family Medicine

## 2016-12-10 VITALS — BP 138/90 | HR 74 | Resp 12 | Ht 65.0 in | Wt 197.4 lb

## 2016-12-10 DIAGNOSIS — I10 Essential (primary) hypertension: Secondary | ICD-10-CM

## 2016-12-10 DIAGNOSIS — F418 Other specified anxiety disorders: Secondary | ICD-10-CM

## 2016-12-10 DIAGNOSIS — R21 Rash and other nonspecific skin eruption: Secondary | ICD-10-CM | POA: Diagnosis not present

## 2016-12-10 DIAGNOSIS — G4700 Insomnia, unspecified: Secondary | ICD-10-CM | POA: Diagnosis not present

## 2016-12-10 MED ORDER — TRIAMCINOLONE ACETONIDE 0.025 % EX OINT: 1.0000 "application " | TOPICAL_OINTMENT | Freq: Two times a day (BID) | CUTANEOUS | 0 refills | Status: DC

## 2016-12-10 MED ORDER — DOXEPIN HCL 10 MG/ML PO CONC: 3.0000 mg | Freq: Every day | ORAL | 0 refills | Status: DC

## 2016-12-10 MED FILL — ZYTIGA 250 MG TABLET: 250 | 30 days supply | Qty: 120 | Fill #0

## 2016-12-10 MED FILL — TRIAMCINOLONE 0.025% OINT: 0.025 | 14 days supply | Qty: 30 | Fill #0

## 2016-12-10 MED FILL — DOXEPIN 10 MG/ML ORAL CONC: 10 | 90 days supply | Qty: 54 | Fill #0

## 2016-12-10 NOTE — Patient Instructions (Signed)
A few things to remember from today's visit:   Insomnia, unspecified type - Plan: doxepin (SINEQUAN) 10 MG/ML solution  Other specified anxiety disorders  Essential hypertension, benign  Erythematous rash - Plan: triamcinolone (KENALOG) 0.025 % ointment  Check blood pressure and let me know about readings in 1-2 weeks. Stop Trazodone, Doxepin added.   Please be sure medication list is accurate. If a new problem present, please set up appointment sooner than planned today.

## 2016-12-10 NOTE — Progress Notes (Signed)
Pre visit review using our clinic review tool, if applicable. No additional management support is needed unless otherwise documented below in the visit note. 

## 2016-12-14 ENCOUNTER — Other Ambulatory Visit: Payer: Self-pay | Admitting: Family Medicine

## 2016-12-14 DIAGNOSIS — F418 Other specified anxiety disorders: Secondary | ICD-10-CM

## 2016-12-18 NOTE — Telephone Encounter (Signed)
Rx phoned in.   

## 2016-12-18 NOTE — Telephone Encounter (Signed)
Rx for Lorazepam 1 mg to continue 1/2-1 tab daily as needed can be called in, # 30/2. Thanks, BJ

## 2017-01-12 ENCOUNTER — Other Ambulatory Visit: Payer: Self-pay | Admitting: Oncology

## 2017-01-12 MED FILL — ZYTIGA 250 MG TABLET: 250 | 30 days supply | Qty: 120 | Fill #0

## 2017-02-02 ENCOUNTER — Other Ambulatory Visit: Payer: Self-pay | Admitting: Oncology

## 2017-02-05 ENCOUNTER — Other Ambulatory Visit: Payer: Self-pay | Admitting: Family Medicine

## 2017-02-05 DIAGNOSIS — I10 Essential (primary) hypertension: Secondary | ICD-10-CM

## 2017-02-05 MED ORDER — AMLODIPINE BESYLATE 2.5 MG PO TABS: 2.5000 mg | ORAL_TABLET | Freq: Every day | ORAL | 1 refills | Status: DC

## 2017-02-09 MED FILL — AMLODIPINE BESYLATE 2.5 MG: 2.5 | 30 days supply | Qty: 30 | Fill #0

## 2017-02-11 MED FILL — ZYTIGA 250 MG TABLET: 250 | 30 days supply | Qty: 120 | Fill #0

## 2017-02-24 ENCOUNTER — Other Ambulatory Visit (HOSPITAL_BASED_OUTPATIENT_CLINIC_OR_DEPARTMENT_OTHER): Payer: Medicare Other

## 2017-02-24 DIAGNOSIS — C61 Malignant neoplasm of prostate: Secondary | ICD-10-CM | POA: Diagnosis not present

## 2017-02-24 LAB — CBC WITH DIFFERENTIAL/PLATELET
BASO%: 0.5 % (ref 0.0–2.0)
Basophils Absolute: 0 10*3/uL (ref 0.0–0.1)
EOS ABS: 0.1 10*3/uL (ref 0.0–0.5)
EOS%: 1.4 % (ref 0.0–7.0)
HCT: 45.9 % (ref 38.4–49.9)
HEMOGLOBIN: 15.5 g/dL (ref 13.0–17.1)
LYMPH%: 15.4 % (ref 14.0–49.0)
MCH: 32.1 pg (ref 27.2–33.4)
MCHC: 33.8 g/dL (ref 32.0–36.0)
MCV: 95 fL (ref 79.3–98.0)
MONO#: 0.3 10*3/uL (ref 0.1–0.9)
MONO%: 4.7 % (ref 0.0–14.0)
NEUT#: 5 10*3/uL (ref 1.5–6.5)
NEUT%: 78 % — AB (ref 39.0–75.0)
Platelets: 137 10*3/uL — ABNORMAL LOW (ref 140–400)
RBC: 4.83 10*6/uL (ref 4.20–5.82)
RDW: 12.8 % (ref 11.0–14.6)
WBC: 6.4 10*3/uL (ref 4.0–10.3)
lymph#: 1 10*3/uL (ref 0.9–3.3)

## 2017-02-24 LAB — COMPREHENSIVE METABOLIC PANEL
ALBUMIN: 3.8 g/dL (ref 3.5–5.0)
ALK PHOS: 69 U/L (ref 40–150)
ALT: 23 U/L (ref 0–55)
AST: 26 U/L (ref 5–34)
Anion Gap: 10 mEq/L (ref 3–11)
BUN: 14 mg/dL (ref 7.0–26.0)
CO2: 25 mEq/L (ref 22–29)
Calcium: 9.2 mg/dL (ref 8.4–10.4)
Chloride: 107 mEq/L (ref 98–109)
Creatinine: 0.9 mg/dL (ref 0.7–1.3)
EGFR: 79 mL/min/{1.73_m2} — ABNORMAL LOW (ref >90)
GLUCOSE: 110 mg/dL (ref 70–140)
Potassium: 4.1 mEq/L (ref 3.5–5.1)
SODIUM: 142 meq/L (ref 136–145)
Total Bilirubin: 1.03 mg/dL (ref 0.20–1.20)
Total Protein: 6.7 g/dL (ref 6.4–8.3)

## 2017-02-25 LAB — PSA

## 2017-02-28 NOTE — Progress Notes (Signed)
HPI:   Tony [name] is a 76 y.o. male, who is here today to follow on some chronic medical problems.  He was last seen on 12/10/16.  Hypertension:   Dx a few years ago, he was on non pharmacologic treatment until recently. Currently on Amlodipine 2.5 mg, started a few weeks ago because persistent elevated BP's in office and at home.   Bradycardia noted, he reports prior Hx. He has followed with cardiologists, annual visits.  He is taking medications as instructed, no side effects reported. Home BP's 130's/80-90's  He has has not noted unusual headache, visual changes, exertional chest pain, dyspnea,  focal weakness, or edema.  He is on Prednisone 5 mg daily and Zytiga for prostate cancer.  Lab Results  Component Value Date   CREATININE 0.9 02/24/2017   BUN 14.0 02/24/2017   NA 142 02/24/2017   K 4.1 02/24/2017   CL 103 06/22/2014   CO2 25 02/24/2017   Insomnia:  Trazodone did not help with waking up through the night. So Doxepin 10 mg/ml was recommended last OV. He is taking Doxepin 10 mg. He sleep well, states that no much difference with Trazodone. He does not like the flavor of Doxepin and would like to go back to Trazodone.  He is sleeping about 6-7 hours. He gets up to urinate a few times during the night and it takes him 1-2 hours to go back to sleep.   Anxiety: He is on Ativan 1 mg, which he takes 1/2 tab at night and morning to help with anxiety. He has taken medication for about 3 years. Anxiety exacerbated by Dx of prostate cancer and ongoing treatment.  He denies side effects and his wife states that it really helps him to "relax."   No new concerns today.  Review of Systems  Constitutional: Positive for fatigue. Negative for activity change, appetite change and fever.  HENT: Negative for nosebleeds, sore throat and trouble swallowing.   Eyes: Negative for redness and visual disturbance.  Respiratory: Negative for shortness of breath  and wheezing.   Cardiovascular: Negative for chest pain, palpitations and leg swelling.  Gastrointestinal: Negative for abdominal pain, nausea and vomiting.  Endocrine: Negative for polydipsia, polyphagia and polyuria.  Genitourinary: Positive for frequency. Negative for decreased urine volume, dysuria and hematuria.  Neurological: Negative for syncope, weakness and headaches.  Psychiatric/Behavioral: Positive for sleep disturbance. Negative for confusion. The patient is nervous/anxious.      Current Outpatient Prescriptions on File Prior to Visit  Medication Sig Dispense Refill  . fluticasone (FLONASE) 50 MCG/ACT nasal spray Place 1 spray into both nostrils daily as needed for allergies or rhinitis.    [name] LORazepam (ATIVAN) 1 MG tablet TAKE 1/2 TO 1 TABLET DAILY AS NEEDED FOR ANXIETY 30 tablet 2  . Multiple Vitamin (MULTIVITAMIN WITH MINERALS) TABS tablet Take 1 tablet by mouth daily.    . naproxen sodium (ANAPROX) 220 MG tablet Take 220 mg by mouth 2 (two) times daily as needed (for soreness).     . triamcinolone (KENALOG) 0.025 % ointment Apply 1 application topically 2 (two) times daily. 30 g 0   No current facility-administered medications on file prior to visit.      Past Medical History:  Diagnosis Date  . Hypertension    no meds  . Prostate cancer ([employer]) 02/08/14   Gleason 4+5=9, PSA 15.65  . Radiation   . Sinus problem    Allergies  Allergen Reactions  . Penicillins  Rash    Social History   Social History  . Marital status: Married    Spouse name: N/A  . Number of children: N/A  . Years of education: N/A   Social History Main Topics  . Smoking status: Former Smoker    Types: Cigars  . Smokeless tobacco: Never Used  . Alcohol use Yes     Comment: 1 beer daily  . Drug use: No  . Sexual activity: Not Currently   Other Topics Concern  . None   Social History Narrative  . None    Vitals:   03/01/17 0849 03/01/17 0938  BP: 132/80   Pulse: (!) 51 (!) 57    Resp: 12    Body mass index is 33.49 kg/m.  Wt Readings from Last 3 Encounters:  03/03/17 202 lb 12.8 oz (92 kg)  03/01/17 201 lb 4 oz (91.3 kg)  12/10/16 197 lb 6 oz (89.5 kg)     Physical Exam  Constitutional: He is oriented to person, place, and time. He appears well-developed. No distress.  HENT:  Head: Atraumatic.  Mouth/Throat: Oropharynx is clear and moist and mucous membranes are normal.  Eyes: Conjunctivae and EOM are normal. Pupils are equal, round, and reactive to light.  Neck: No thyroid mass present.  Cardiovascular: Normal rate.  A regularly irregular rhythm present.  Occasional extrasystoles are present.  No murmur heard. Pulses:      Dorsalis pedis pulses are 2+ on the right side, and 2+ on the left side.  Varicose veins LE, bilateral.  Respiratory: Effort normal and breath sounds normal. No respiratory distress.  GI: Soft. He exhibits no mass. There is no hepatomegaly. There is no tenderness.  Musculoskeletal: He exhibits edema (1+ pitting edema LE, bilateral).  Lymphadenopathy:    He has no cervical adenopathy.  Neurological: He is alert and oriented to person, place, and time. He has normal strength.  Mildly unstable gait when first gets up, not assisted.  Skin: Skin is warm. No erythema.  Psychiatric: He has a normal mood and affect. Cognition and memory are normal.  Well groomed, good eye contact.     ASSESSMENT AND PLAN:   Tony [name] was seen today for follow-up.  Diagnoses and all orders for this visit:  Essential hypertension, benign  Adequately controlled. No changes in current management. DASH-low salt diet recommended. Eye exam recommended annually, he is due. F/U in 4 months, before if needed.  -     amLODipine (NORVASC) 2.5 MG tablet; Take 1 tablet (2.5 mg total) by mouth daily.  Other specified anxiety disorders  Stable. No changes in current management. We discussed some side effects of Xanax. He has a refill left from last  Rx. Trazodone may also help.  F/U in 4 months.  Insomnia, unspecified type  Good sleep hygiene, difficult due to urinary frequency. Resume Trazodone 50 mg at bedtime, stop Doxepin. Fall precautions discussed, he has a cane at home that he uses as needed.  -     traZODone (DESYREL) 50 MG tablet; Take 0.5-1 tablets (25-50 mg total) by mouth at bedtime as needed for sleep.  Sinus bradycardia  Chronic. Hx of CAD. Asymptomatic. We will continue following, keeps next appt with cardiologists.  Instructed about warning signs.    -Tony [name] was advised to return sooner than planned today if new concerns arise.       Jalaina Salyers G. [name], MD  [facility]. [place] office.

## 2017-03-01 ENCOUNTER — Encounter: Payer: Self-pay | Admitting: Family Medicine

## 2017-03-01 ENCOUNTER — Ambulatory Visit (INDEPENDENT_AMBULATORY_CARE_PROVIDER_SITE_OTHER): Payer: Medicare Other | Admitting: Family Medicine

## 2017-03-01 VITALS — BP 132/80 | HR 57 | Resp 12 | Ht 65.0 in | Wt 201.2 lb

## 2017-03-01 DIAGNOSIS — F418 Other specified anxiety disorders: Secondary | ICD-10-CM | POA: Diagnosis not present

## 2017-03-01 DIAGNOSIS — I10 Essential (primary) hypertension: Secondary | ICD-10-CM | POA: Diagnosis not present

## 2017-03-01 DIAGNOSIS — G4700 Insomnia, unspecified: Secondary | ICD-10-CM | POA: Diagnosis not present

## 2017-03-01 DIAGNOSIS — R001 Bradycardia, unspecified: Secondary | ICD-10-CM

## 2017-03-01 MED ORDER — AMLODIPINE BESYLATE 2.5 MG PO TABS: 2.5000 mg | ORAL_TABLET | Freq: Every day | ORAL | 2 refills | Status: DC

## 2017-03-01 MED ORDER — TRAZODONE HCL 50 MG PO TABS: 25.0000 mg | ORAL_TABLET | Freq: Every evening | ORAL | 1 refills | Status: DC | PRN

## 2017-03-01 NOTE — Patient Instructions (Signed)
A few things to remember from today's visit:   Essential hypertension, benign - Plan: amLODipine (NORVASC) 2.5 MG tablet  Other specified anxiety disorders  Insomnia, unspecified type - Plan: traZODone (DESYREL) 50 MG tablet  Back to Trazodone. Fall prevention.  Rest no changes.   Please be sure medication list is accurate. If a new problem present, please set up appointment sooner than planned today.

## 2017-03-02 ENCOUNTER — Other Ambulatory Visit: Payer: Self-pay | Admitting: *Deleted

## 2017-03-02 MED ORDER — PREDNISONE 5 MG PO TABS: 5.0000 mg | ORAL_TABLET | Freq: Every day | ORAL | 0 refills | Status: DC

## 2017-03-03 ENCOUNTER — Ambulatory Visit (HOSPITAL_BASED_OUTPATIENT_CLINIC_OR_DEPARTMENT_OTHER): Payer: Medicare Other

## 2017-03-03 ENCOUNTER — Other Ambulatory Visit: Payer: Self-pay | Admitting: Oncology

## 2017-03-03 ENCOUNTER — Telehealth: Payer: Self-pay | Admitting: Oncology

## 2017-03-03 ENCOUNTER — Ambulatory Visit (HOSPITAL_BASED_OUTPATIENT_CLINIC_OR_DEPARTMENT_OTHER): Payer: Medicare Other | Admitting: Oncology

## 2017-03-03 VITALS — BP 168/84 | HR 47 | Temp 97.7°F | Resp 18 | Ht 65.0 in | Wt 202.8 lb

## 2017-03-03 DIAGNOSIS — C61 Malignant neoplasm of prostate: Secondary | ICD-10-CM | POA: Diagnosis not present

## 2017-03-03 DIAGNOSIS — D751 Secondary polycythemia: Secondary | ICD-10-CM

## 2017-03-03 DIAGNOSIS — E876 Hypokalemia: Secondary | ICD-10-CM | POA: Diagnosis not present

## 2017-03-03 DIAGNOSIS — I10 Essential (primary) hypertension: Secondary | ICD-10-CM | POA: Diagnosis not present

## 2017-03-03 DIAGNOSIS — Z5111 Encounter for antineoplastic chemotherapy: Secondary | ICD-10-CM | POA: Diagnosis not present

## 2017-03-03 DIAGNOSIS — E291 Testicular hypofunction: Secondary | ICD-10-CM | POA: Diagnosis not present

## 2017-03-03 MED ORDER — LEUPROLIDE ACETATE (4 MONTH) 30 MG IM KIT
22.5000 mg | PACK | Freq: Once | INTRAMUSCULAR | Status: AC
  Administered 2017-03-03: 22.5 mg via INTRAMUSCULAR
  Filled 2017-03-03: qty 30

## 2017-03-03 NOTE — Patient Instructions (Signed)
Leuprolide depot injection What is this medicine? LEUPROLIDE (loo PROE lide) is a man-made protein that acts like a natural hormone in the body. It decreases testosterone in men and decreases estrogen in women. In men, this medicine is used to treat advanced prostate cancer. In women, some forms of this medicine may be used to treat endometriosis, uterine fibroids, or other male hormone-related problems. This medicine may be used for other purposes; ask your health care provider or pharmacist if you have questions. COMMON BRAND NAME(S): Eligard, Lupron Depot, Lupron Depot-Ped, Viadur What should I tell my health care provider before I take this medicine? They need to know if you have any of these conditions: -diabetes -heart disease or previous heart attack -high blood pressure -high cholesterol -mental illness -osteoporosis -pain or difficulty passing urine -seizures -spinal cord metastasis -stroke -suicidal thoughts, plans, or attempt; a previous suicide attempt by you or a family member -tobacco smoker -unusual vaginal bleeding (women) -an unusual or allergic reaction to leuprolide, benzyl alcohol, other medicines, foods, dyes, or preservatives -pregnant or trying to get pregnant -breast-feeding How should I use this medicine? This medicine is for injection into a muscle or for injection under the skin. It is given by a health care professional in a hospital or clinic setting. The specific product will determine how it will be given to you. Make sure you understand which product you receive and how often you will receive it. Talk to your pediatrician regarding the use of this medicine in children. Special care may be needed. Overdosage: If you think you have taken too much of this medicine contact a poison control center or emergency room at once. NOTE: This medicine is only for you. Do not share this medicine with others. What if I miss a dose? It is important not to miss a dose.  Call your doctor or health care professional if you are unable to keep an appointment. Depot injections: Depot injections are given either once-monthly, every 12 weeks, every 16 weeks, or every 24 weeks depending on the product you are prescribed. The product you are prescribed will be based on if you are male or male, and your condition. Make sure you understand your product and dosing. What may interact with this medicine? Do not take this medicine with any of the following medications: -chasteberry This medicine may also interact with the following medications: -herbal or dietary supplements, like black cohosh or DHEA -male hormones, like estrogens or progestins and birth control pills, patches, rings, or injections -male hormones, like testosterone This list may not describe all possible interactions. Give your health care provider a list of all the medicines, herbs, non-prescription drugs, or dietary supplements you use. Also tell them if you smoke, drink alcohol, or use illegal drugs. Some items may interact with your medicine. What should I watch for while using this medicine? Visit your doctor or health care professional for regular checks on your progress. During the first weeks of treatment, your symptoms may get worse, but then will improve as you continue your treatment. You may get hot flashes, increased bone pain, increased difficulty passing urine, or an aggravation of nerve symptoms. Discuss these effects with your doctor or health care professional, some of them may improve with continued use of this medicine. Male patients may experience a menstrual cycle or spotting during the first months of therapy with this medicine. If this continues, contact your doctor or health care professional. What side effects may I notice from receiving this medicine? Side   effects that you should report to your doctor or health care professional as soon as possible: -allergic reactions like skin  rash, itching or hives, swelling of the face, lips, or tongue -breathing problems -chest pain -depression or memory disorders -pain in your legs or groin -pain at site where injected or implanted -seizures -severe headache -swelling of the feet and legs -suicidal thoughts or other mood changes -visual changes -vomiting Side effects that usually do not require medical attention (report to your doctor or health care professional if they continue or are bothersome): -breast swelling or tenderness -decrease in sex drive or performance -diarrhea -hot flashes -loss of appetite -muscle, joint, or bone pains -nausea -redness or irritation at site where injected or implanted -skin problems or acne This list may not describe all possible side effects. Call your doctor for medical advice about side effects. You may report side effects to FDA at 1-800-FDA-1088. Where should I keep my medicine? This drug is given in a hospital or clinic and will not be stored at home. NOTE: This sheet is a summary. It may not cover all possible information. If you have questions about this medicine, talk to your doctor, pharmacist, or health care provider.  2018 Elsevier/Gold Standard (2016-02-13 09:45:53)  

## 2017-03-03 NOTE — Progress Notes (Signed)
Hematology and Oncology Follow Up Visit  Tony [name] [id number] December 23, 1940 76 y.o. 03/03/2017 8:51 AM Tony [name], [name], [name], MD   Principle Diagnosis: 76 year old gentleman with prostate cancer diagnosed in May 2015. His PSA was 15.7, Gleason score is 4+5 = 9 with 12 out of 12 cores involved. His initial clinical staging is T3a N0. His metastatic workup was on revealing.   Prior Therapy: He is status post radiation therapy for external beam radiation completed in September 2015 for a total of 45 gray. He also status post seed implant brachytherapy boost completed in October 2015. He received total of 1 year of androgen deprivation 22,015 and 2016. His PSA nadir was to 0.47 in November 2016. His PSA was 4.08 in May 2017.  Current therapy:  Androgen deprivation in the form of Lupron 22.5 mg resumed on 03/02/2016. This will be given every 3 months. Zytiga 1000 mg with prednisone started around August 24 of 2017.   Interim History: Tony [name] presents today for a follow-up visit. Since the last visit, he reports no changes in his health. He remains active and attends activities of daily living. He continues to tolerate Zytiga without any complications. He does report some mild fatigue but no major changes. He does not report any nausea, abdominal pain or peripheral neuropathy. He did not report any lower extremity edema. He reports no complications related to Lupron although he forces sexual drive has been affected but his quality of life remains unchanged.  He does not report any headaches blurred vision or double vision. He does not report any fevers, chills or sweats. He does not report any chest pain shortness of breath cough or hemoptysis. Does not report any palpitation orthopnea or PND.  He does not report any nausea, vomiting, abdominal pain, or satiety or change in his bowel habits. He does not report any musca skeletal complaints. He does not report any skin rashes or  lymphadenopathy. Rest of his review of systems unremarkable.   Medications: I have reviewed the patient's current medications.  Current Outpatient Prescriptions  Medication Sig Dispense Refill  . amLODipine (NORVASC) 2.5 MG tablet Take 1 tablet (2.5 mg total) by mouth daily. 90 tablet 2  . fluticasone (FLONASE) 50 MCG/ACT nasal spray Place 1 spray into both nostrils daily as needed for allergies or rhinitis.    [name] LORazepam (ATIVAN) 1 MG tablet TAKE 1/2 TO 1 TABLET DAILY AS NEEDED FOR ANXIETY 30 tablet 2  . Multiple Vitamin (MULTIVITAMIN WITH MINERALS) TABS tablet Take 1 tablet by mouth daily.    . naproxen sodium (ANAPROX) 220 MG tablet Take 220 mg by mouth 2 (two) times daily as needed (for soreness).     . predniSONE (DELTASONE) 5 MG tablet Take 1 tablet (5 mg total) by mouth daily with breakfast. 90 tablet 0  . traZODone (DESYREL) 50 MG tablet Take 0.5-1 tablets (25-50 mg total) by mouth at bedtime as needed for sleep. 30 tablet 1  . triamcinolone (KENALOG) 0.025 % ointment Apply 1 application topically 2 (two) times daily. 30 [name] 0  . ZYTIGA 250 MG tablet TAKE 4 TABLETS BY MOUTH DAILY. TAKE ON AN EMPTY STOMACH 1 HOUR BEFORE OR 2 HOURS AFTER A MEAL 120 tablet 0   No current facility-administered medications for this visit.      Allergies:  Allergies  Allergen Reactions  . Penicillins Rash    Past Medical History, Surgical history, Social history, and Family History were reviewed and updated.   Physical Exam:  Blood pressure (!) 168/84, pulse (!) 47, temperature 97.7 F (36.5 C), temperature source Oral, resp. rate 18, height 5\' 5"  (1.651 m), weight 202 lb 12.8 oz (92 kg), SpO2 97 %. ECOG: 1 General appearance: Alert, awake gentleman without distress. Head: Normocephalic, without obvious abnormality no oral thrush or ulcers. Neck: no adenopathy Lymph nodes: Cervical, supraclavicular, and axillary nodes normal. Heart:regular rate and rhythm, S1, S2 normal, no murmur, click, rub or  gallop Lung:chest clear, no wheezing, rales, normal symmetric air entry Abdomin: soft, non-tender, without masses or organomegaly no rebound or guarding. EXT:no erythema, induration, or nodules   Lab Results: Lab Results  Component Value Date   WBC 6.4 02/24/2017   HGB 15.5 02/24/2017   HCT 45.9 02/24/2017   MCV 95.0 02/24/2017   PLT 137 (L) 02/24/2017     Chemistry      Component Value Date/Time   NA 142 02/24/2017 0841   K 4.1 02/24/2017 0841   CL 103 06/22/2014 0945   CO2 25 02/24/2017 0841   BUN 14.0 02/24/2017 0841   CREATININE 0.9 02/24/2017 0841   GLU 105 01/10/2016      Component Value Date/Time   CALCIUM 9.2 02/24/2017 0841   ALKPHOS 69 02/24/2017 0841   AST 26 02/24/2017 0841   ALT 23 02/24/2017 0841   BILITOT 1.03 02/24/2017 0841         Impression and Plan:   76 year old gentleman with the following issues:  1. Prostate cancer diagnosed in May 2015. He had a PSA of 15.6 and a Gleason score 4+5 = 9. He received local therapy with external beam radiation as well as seed implants boost completed in October 2015. His PSA nadir was to 0.17 in July 2016. PSA increased up to 6.2 with a testosterone of 697 in June 2017.  CT scan and bone scan obtained on 02/26/2016 showed no measurable disease.  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   He started Zytiga in August 2017 and has tolerated it well [name] far.  His PSA continues to be undetectable with excellent clinical benefit. Risks and benefits of continuing this medication was reviewed today and is agreeable to continue.  2. Androgen depravation: He will receive Lupron 22.5 mg injection every 3 months. He'll receive his injection today and in 3 months.  3. Polycythemia: Hemoglobin seems to be improving after he decrease his smoking.  4.Hypokalemia: This has not been detected on Zytiga. We will continue to monitor his electrolytes.  5. 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 We will continue to monitor his blood pressure on Zytiga.  6. Follow-up: Will be in 3 months.   [facility], MD 6/20/20188:51 AM

## 2017-03-03 NOTE — Telephone Encounter (Signed)
Scheduled appt per 6/20 los - Gave patient AVS and calender per LOS.  

## 2017-03-03 NOTE — Addendum Note (Signed)
Addended by: Wyatt Portela on: 03/03/2017 09:00 AM   Modules accepted: Orders

## 2017-03-08 MED FILL — ZYTIGA 250 MG TABLET: 250 | 30 days supply | Qty: 120 | Fill #0

## 2017-03-11 ENCOUNTER — Ambulatory Visit: Payer: Medicare Other | Admitting: Family Medicine

## 2017-03-30 ENCOUNTER — Other Ambulatory Visit: Payer: Self-pay | Admitting: Family Medicine

## 2017-03-30 DIAGNOSIS — F418 Other specified anxiety disorders: Secondary | ICD-10-CM

## 2017-03-31 ENCOUNTER — Other Ambulatory Visit: Payer: Self-pay

## 2017-03-31 DIAGNOSIS — G4700 Insomnia, unspecified: Secondary | ICD-10-CM

## 2017-03-31 MED ORDER — TRAZODONE HCL 50 MG PO TABS: 25.0000 mg | ORAL_TABLET | Freq: Every evening | ORAL | 3 refills | Status: DC | PRN

## 2017-03-31 NOTE — Telephone Encounter (Signed)
Last filled 03/07/17. Rx for Lorazepam 1 mg can be called in to continue 0.5-1 tab daily as needed.# 30/3. Thanks, BJ

## 2017-03-31 NOTE — Telephone Encounter (Signed)
Rx last filled 03/07/17

## 2017-03-31 NOTE — Telephone Encounter (Signed)
To be filled 04/06/17.

## 2017-04-01 ENCOUNTER — Other Ambulatory Visit: Payer: Self-pay | Admitting: Family Medicine

## 2017-04-01 DIAGNOSIS — F418 Other specified anxiety disorders: Secondary | ICD-10-CM

## 2017-04-02 NOTE — Telephone Encounter (Signed)
Rx phoned in.   

## 2017-04-09 ENCOUNTER — Other Ambulatory Visit: Payer: Self-pay | Admitting: Oncology

## 2017-04-09 MED FILL — ZYTIGA 250 MG TABLET: 250 | 30 days supply | Qty: 120 | Fill #0

## 2017-04-12 ENCOUNTER — Other Ambulatory Visit: Payer: Self-pay

## 2017-04-12 DIAGNOSIS — G4700 Insomnia, unspecified: Secondary | ICD-10-CM

## 2017-04-12 MED ORDER — TRAZODONE HCL 50 MG PO TABS: 25.0000 mg | ORAL_TABLET | Freq: Every evening | ORAL | 1 refills | Status: DC | PRN

## 2017-04-13 ENCOUNTER — Encounter: Payer: Self-pay | Admitting: Family Medicine

## 2017-04-29 ENCOUNTER — Other Ambulatory Visit: Payer: Self-pay | Admitting: Oncology

## 2017-05-05 ENCOUNTER — Telehealth: Payer: Self-pay | Admitting: Pharmacy Technician

## 2017-05-05 NOTE — Telephone Encounter (Signed)
Oral Oncology Patient Advocate Encounter  Received communication from the [facility] [place] today, 05-05-2017, that Mr. Machnik was eligible to re-apply for previous grant funding.    Funds are not open at this time, but I will continue to monitor and re-enroll the patient when the funds are available.  He has $2963.66 remaining in a [employer] as of 05/05/2017.    I have spoken with the patient spouse and she is appreciative and understanding of the plan.    Fabio Asa. [name], [place] [facility] [place] [phone] 05/05/2017 4:10 PM

## 2017-05-11 MED FILL — ZYTIGA 250 MG TABLET: 250 | 30 days supply | Qty: 120 | Fill #0

## 2017-05-27 ENCOUNTER — Other Ambulatory Visit (HOSPITAL_BASED_OUTPATIENT_CLINIC_OR_DEPARTMENT_OTHER): Payer: Medicare Other

## 2017-05-27 DIAGNOSIS — C61 Malignant neoplasm of prostate: Secondary | ICD-10-CM | POA: Diagnosis not present

## 2017-05-27 LAB — COMPREHENSIVE METABOLIC PANEL
ALBUMIN: 3.8 g/dL (ref 3.5–5.0)
ALK PHOS: 68 U/L (ref 40–150)
ALT: 15 U/L (ref 0–55)
ANION GAP: 11 meq/L (ref 3–11)
AST: 19 U/L (ref 5–34)
BILIRUBIN TOTAL: 0.88 mg/dL (ref 0.20–1.20)
BUN: 12.2 mg/dL (ref 7.0–26.0)
CALCIUM: 9.5 mg/dL (ref 8.4–10.4)
CO2: 25 mEq/L (ref 22–29)
Chloride: 106 mEq/L (ref 98–109)
Creatinine: 0.9 mg/dL (ref 0.7–1.3)
EGFR: 81 mL/min/{1.73_m2} — AB (ref >90)
Glucose: 111 mg/dl (ref 70–140)
Potassium: 3.8 mEq/L (ref 3.5–5.1)
Sodium: 141 mEq/L (ref 136–145)
TOTAL PROTEIN: 6.7 g/dL (ref 6.4–8.3)

## 2017-05-27 LAB — CBC WITH DIFFERENTIAL/PLATELET
BASO%: 0.8 % (ref 0.0–2.0)
Basophils Absolute: 0 10*3/uL (ref 0.0–0.1)
EOS ABS: 0.1 10*3/uL (ref 0.0–0.5)
EOS%: 1.3 % (ref 0.0–7.0)
HEMATOCRIT: 47.7 % (ref 38.4–49.9)
HEMOGLOBIN: 16.1 g/dL (ref 13.0–17.1)
LYMPH%: 14.2 % (ref 14.0–49.0)
MCH: 31.7 pg (ref 27.2–33.4)
MCHC: 33.7 g/dL (ref 32.0–36.0)
MCV: 94 fL (ref 79.3–98.0)
MONO#: 0.4 10*3/uL (ref 0.1–0.9)
MONO%: 6.9 % (ref 0.0–14.0)
NEUT%: 76.8 % — ABNORMAL HIGH (ref 39.0–75.0)
NEUTROS ABS: 4.1 10*3/uL (ref 1.5–6.5)
PLATELETS: 143 10*3/uL (ref 140–400)
RBC: 5.07 10*6/uL (ref 4.20–5.82)
RDW: 13.3 % (ref 11.0–14.6)
WBC: 5.4 10*3/uL (ref 4.0–10.3)
lymph#: 0.8 10*3/uL — ABNORMAL LOW (ref 0.9–3.3)

## 2017-05-28 LAB — PSA: Prostate Specific Ag, Serum: <0.1 ng/mL (ref 0.0–4.0)

## 2017-05-31 ENCOUNTER — Other Ambulatory Visit: Payer: Self-pay | Admitting: Oncology

## 2017-05-31 NOTE — Progress Notes (Signed)
HPI:   [name] is a 76 y.o. male, who is here today with his wife for 3-4 months follow up.   He was last seen on 03/01/17.  Since his last OV he has followed with oncologists, Dr Tony [name], for prostate cancer.  Insomnia: Doxepin did not help. Last OV Trazodone 50 mg was started to treat insomnia.  Sleeping about 6-7 hours. He feels lie he still "canot sleep" but his wife noted that he is sleeping better. He goes to bed between 6-7 pm and sometimes earlier, wakes up around 1-2 Am to urinate. Most of the time he cannot go back to sleep, so he reads or watch TV.  He denies side effects from medication.  He is also asking if there is something he can take to give him energy. He has Hx of fatigue. He states that he notices the difference when he takes a nap in the middle of the day, which he does not do frequently. It does not affected his sleep at night.  He denies constipation/diarreha,cold/heat intolerance.  He is on chemo treatment for prostate cancer, Prednisone 5 mg daily,Zytiga, and Lupron injection.  Anxiety:  He is on Ativan 1 mg for 3+ years , he takes 1/2-1 tab at bedtime. His wife was recently hospitalized for acute CVA, this event exacerbated anxiety. In general he is dealing well with stress.  He denies depressed mood or suicidal thoughts.  HTN:  He is not checking BP's at home. Elevated BP's during OV's sometimes. He is following low salt diet. Currently he is on Amlodipine 2.5 mg daily, tolerating well,no side effects reported.  Denies headache, visual changes, chest pain, dyspnea, palpitation, focal weakness, or edema.  He follows with cardiologists, Dr Tony [name]. 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   Review of Systems  Constitutional: Positive for fatigue. Negative for activity change, appetite change and fever.  HENT: Negative for nosebleeds, sore throat and trouble swallowing.   Eyes: Negative for redness and visual disturbance.    Respiratory: Negative for cough, shortness of breath and wheezing.   Cardiovascular: Negative for chest pain, palpitations and leg swelling.  Gastrointestinal: Negative for abdominal pain, nausea and vomiting.  Endocrine: Negative for cold intolerance and heat intolerance.  Genitourinary: Negative for decreased urine volume, dysuria and hematuria.  Neurological: Negative for seizures, syncope, weakness, numbness and headaches.  Psychiatric/Behavioral: Positive for sleep disturbance. Negative for confusion and hallucinations. The patient is nervous/anxious.       Current Outpatient Prescriptions on File Prior to Visit  Medication Sig Dispense Refill  . amLODipine (NORVASC) 2.5 MG tablet Take 1 tablet (2.5 mg total) by mouth daily. 90 tablet 2  . fluticasone (FLONASE) 50 MCG/ACT nasal spray Place 1 spray into both nostrils daily as needed for allergies or rhinitis.    [name] LORazepam (ATIVAN) 1 MG tablet TAKE 1/2 TO 1 TABLET BY MOUTH DAILY AS NEEDED 30 tablet 3  . Multiple Vitamin (MULTIVITAMIN WITH MINERALS) TABS tablet Take 1 tablet by mouth daily.    . naproxen sodium (ANAPROX) 220 MG tablet Take 220 mg by mouth 2 (two) times daily as needed (for soreness).     . predniSONE (DELTASONE) 5 MG tablet Take 1 tablet (5 mg total) by mouth daily with breakfast. 90 tablet 0  . traZODone (DESYREL) 50 MG tablet Take 0.5-1 tablets (25-50 mg total) by mouth at bedtime as needed for sleep. 90 tablet 1  . triamcinolone (KENALOG) 0.025 % ointment Apply 1 application  topically 2 (two) times daily. 30 g 0  . ZYTIGA 250 MG tablet TAKE 4 TABLETS BY MOUTH DAILY. TAKE ON AN EMPTY STOMACH 1 HOUR BEFORE OR 2 HOURS AFTER A MEAL 120 tablet 0   No current facility-administered medications on file prior to visit.      Past Medical History:  Diagnosis Date  . Hypertension    no meds  . Prostate cancer ([place]) 02/08/14   Gleason 4+5=9, PSA 15.65  . Radiation   . Sinus problem    Allergies  Allergen Reactions  .  Penicillins Rash    Social History   Social History  . Marital status: Married    Spouse name: N/A  . Number of children: N/A  . Years of education: N/A   Social History Main Topics  . Smoking status: Former Smoker    Types: Cigars  . Smokeless tobacco: Never Used  . Alcohol use Yes     Comment: 1 beer daily  . Drug use: No  . Sexual activity: Not Currently   Other Topics Concern  . None   Social History Narrative  . None    Vitals:   06/01/17 0840  BP: 116/80  Pulse: 62  Resp: 12  SpO2: 97%   Body mass index is 32.7 kg/m.  Wt Readings from Last 3 Encounters:  06/03/17 197 lb (89.4 kg)  06/01/17 196 lb 8 oz (89.1 kg)  03/03/17 202 lb 12.8 oz (92 kg)    Physical Exam  Nursing note and vitals reviewed. Constitutional: He is oriented to person, place, and time. He appears well-developed. No distress.  HENT:  Head: Normocephalic and atraumatic.  Mouth/Throat: Oropharynx is clear and moist and mucous membranes are normal. He has dentures.  Eyes: Pupils are equal, round, and reactive to light. Conjunctivae are normal.  Cardiovascular: Normal rate and regular rhythm.   No murmur heard. Pulses:      Dorsalis pedis pulses are 2+ on the right side, and 2+ on the left side.  Respiratory: Effort normal and breath sounds normal. No respiratory distress.  GI: Soft. He exhibits no mass. There is no hepatomegaly. There is no tenderness.  Musculoskeletal: He exhibits edema (1+ LE edema LE, bilateral.). He exhibits no tenderness.  Lymphadenopathy:    He has no cervical adenopathy.  Neurological: He is alert and oriented to person, place, and time. He has normal strength.  Stable gait with no assistance.  Skin: Skin is warm. No erythema.  Psychiatric: He has a normal mood and affect. Cognition and memory are normal.  Well groomed, good eye contact.     ASSESSMENT AND PLAN:   [name] was seen today for 3-4 months follow-up.  Diagnoses and all  orders for this visit:  Insomnia, unspecified type  Improved per wife report. No changes in Trazodone dose. Some side effects discussed. Good sleep hygiene. F/U in 4-5 months.  Other specified anxiety disorders  Stable. No changes in current management. We discussed side effects of Lorazepam. F/U in 4-5 months.  Chronic fatigue  We discussed possible etiologies: Systemic illness, immunologic,endocrinology,sleep disorder, psychiatric/psychologic, infectious,medications side effects, and idiopathic.  He has Hx of insomnia, anxiety,and on chemo; all these, I explained, could be causing or exacerbating problem. He has labs periodically at his oncologist's office, I will add TSH to be added to next labs on 06/03/17.  I recommend taking a nap daily, which has helped before.  -     TSH; Future  Essential hypertension, benign  Here today adequately controlled. No changes in current management. DASH-low salt diet recommended. Eye exam recommended annually. F/U in 5 months, before if needed.  Need for influenza vaccination -     Flu vaccine HIGH DOSE PF      -[name] was advised to return sooner than planned today if new concerns arise.       Tony [name] G. [name], MD  [facility]. [place] office.

## 2017-06-01 ENCOUNTER — Ambulatory Visit (INDEPENDENT_AMBULATORY_CARE_PROVIDER_SITE_OTHER): Payer: Medicare Other | Admitting: Family Medicine

## 2017-06-01 ENCOUNTER — Encounter: Payer: Self-pay | Admitting: Family Medicine

## 2017-06-01 VITALS — BP 116/80 | HR 62 | Resp 12 | Ht 65.0 in | Wt 196.5 lb

## 2017-06-01 DIAGNOSIS — F418 Other specified anxiety disorders: Secondary | ICD-10-CM | POA: Diagnosis not present

## 2017-06-01 DIAGNOSIS — I10 Essential (primary) hypertension: Secondary | ICD-10-CM

## 2017-06-01 DIAGNOSIS — R5382 Chronic fatigue, unspecified: Secondary | ICD-10-CM | POA: Diagnosis not present

## 2017-06-01 DIAGNOSIS — G4700 Insomnia, unspecified: Secondary | ICD-10-CM | POA: Diagnosis not present

## 2017-06-01 DIAGNOSIS — Z23 Encounter for immunization: Secondary | ICD-10-CM | POA: Diagnosis not present

## 2017-06-01 NOTE — Patient Instructions (Addendum)
A few things to remember from today's visit:   Insomnia, unspecified type  Other specified anxiety disorders  Chronic fatigue  No changes today. Take a 1-2 hours nap if it helps.   Please be sure medication list is accurate. If a new problem present, please set up appointment sooner than planned today.

## 2017-06-03 ENCOUNTER — Telehealth: Payer: Self-pay | Admitting: Oncology

## 2017-06-03 ENCOUNTER — Ambulatory Visit (HOSPITAL_BASED_OUTPATIENT_CLINIC_OR_DEPARTMENT_OTHER): Payer: Medicare Other

## 2017-06-03 ENCOUNTER — Ambulatory Visit (HOSPITAL_BASED_OUTPATIENT_CLINIC_OR_DEPARTMENT_OTHER): Payer: Medicare Other | Admitting: Oncology

## 2017-06-03 VITALS — BP 151/87 | HR 53 | Temp 97.9°F | Resp 17 | Ht 65.0 in | Wt 197.0 lb

## 2017-06-03 DIAGNOSIS — E291 Testicular hypofunction: Secondary | ICD-10-CM

## 2017-06-03 DIAGNOSIS — I10 Essential (primary) hypertension: Secondary | ICD-10-CM | POA: Diagnosis not present

## 2017-06-03 DIAGNOSIS — Z5111 Encounter for antineoplastic chemotherapy: Secondary | ICD-10-CM

## 2017-06-03 DIAGNOSIS — C61 Malignant neoplasm of prostate: Secondary | ICD-10-CM

## 2017-06-03 MED ORDER — LEUPROLIDE ACETATE (4 MONTH) 30 MG IM KIT
22.5000 mg | PACK | Freq: Once | INTRAMUSCULAR | Status: AC
  Administered 2017-06-03: 22.5 mg via INTRAMUSCULAR
  Filled 2017-06-03: qty 30

## 2017-06-03 NOTE — Progress Notes (Signed)
Hematology and Oncology Follow Up Visit  Tony [name] [id number] 1941/05/09 76 y.o. 06/03/2017 11:46 AM [name], [name], [name], [name], MD   Principle Diagnosis: 76 year old gentleman with prostate cancer diagnosed in May 2015. His PSA was 15.7, Gleason score is 4+5 = 9 with 12 out of 12 cores involved. His initial clinical staging is T3a N0. His metastatic workup was on revealing.   Prior Therapy: He is status post radiation therapy for external beam radiation completed in September 2015 for a total of 45 gray. He also status post seed implant brachytherapy boost completed in October 2015. He received total of 1 year of androgen deprivation 22,015 and 2016. His PSA nadir was to 0.47 in November 2016. His PSA was 4.08 in May 2017.  Current therapy:  Androgen deprivation in the form of Lupron 22.5 mg resumed on 03/02/2016. This will be given every 3 months. Zytiga 1000 mg with prednisone started around August 24 of 2017.   Interim History: [name] presents today for a follow-up visit. Since the last visit, he continues to do well without any changes in his health.  He continues to take Zytiga without any complications. He denied any difficulties obtaining or taking this medication. He does report some mild fatigue which has not changed. He does not report any nausea, abdominal pain or peripheral neuropathy. He did not report any lower extremity edema. He reports no complications related to Lupron. He is able to drive and attends to activities of daily living.  He does not report any headaches blurred vision or double vision. He does not report any fevers, chills or sweats. He does not report any chest pain shortness of breath cough or hemoptysis. Does not report any palpitation orthopnea or PND.  He does not report any nausea, vomiting, abdominal pain, or satiety or change in his bowel habits. He does not report any musca skeletal complaints. He does not report any skin rashes or  lymphadenopathy. Rest of his review of systems unremarkable.   Medications: I have reviewed the patient's current medications.  Current Outpatient Prescriptions  Medication Sig Dispense Refill  . amLODipine (NORVASC) 2.5 MG tablet Take 1 tablet (2.5 mg total) by mouth daily. 90 tablet 2  . fluticasone (FLONASE) 50 MCG/ACT nasal spray Place 1 spray into both nostrils daily as needed for allergies or rhinitis.    [name] LORazepam (ATIVAN) 1 MG tablet TAKE 1/2 TO 1 TABLET BY MOUTH DAILY AS NEEDED 30 tablet 3  . Multiple Vitamin (MULTIVITAMIN WITH MINERALS) TABS tablet Take 1 tablet by mouth daily.    . naproxen sodium (ANAPROX) 220 MG tablet Take 220 mg by mouth 2 (two) times daily as needed (for soreness).     . predniSONE (DELTASONE) 5 MG tablet Take 1 tablet (5 mg total) by mouth daily with breakfast. 90 tablet 0  . traZODone (DESYREL) 50 MG tablet Take 0.5-1 tablets (25-50 mg total) by mouth at bedtime as needed for sleep. 90 tablet 1  . triamcinolone (KENALOG) 0.025 % ointment Apply 1 application topically 2 (two) times daily. 30 g 0  . ZYTIGA 250 MG tablet TAKE 4 TABLETS BY MOUTH DAILY. TAKE ON AN EMPTY STOMACH 1 HOUR BEFORE OR 2 HOURS AFTER A MEAL 120 tablet 0   No current facility-administered medications for this visit.      Allergies:  Allergies  Allergen Reactions  . Penicillins Rash    Past Medical History, Surgical history, Social history, and Family History were reviewed and updated.  Physical Exam: Blood pressure (!) 151/87, pulse (!) 53, temperature 97.9 F (36.6 C), temperature source Oral, resp. rate 17, height 5\' 5"  (1.651 m), weight 197 lb (89.4 kg), SpO2 96 %. ECOG: 1 General appearance: Alert, awake gentleman without distress. Head: Normocephalic, without obvious abnormality no oral ulcers or lesions. Neck: no adenopathy no thyroid masses. Lymph nodes: Cervical, supraclavicular, and axillary nodes normal. Heart:regular rate and rhythm, S1, S2 normal, no murmur,  click, rub or gallop Lung:chest clear, no wheezing, rales, normal symmetric air entry Abdomin: soft, non-tender, without masses or organomegaly no shifting dullness or ascites. EXT:no erythema, induration, or nodules   Lab Results: Lab Results  Component Value Date   WBC 5.4 05/27/2017   HGB 16.1 05/27/2017   HCT 47.7 05/27/2017   MCV 94.0 05/27/2017   PLT 143 05/27/2017     Chemistry      Component Value Date/Time   NA 141 05/27/2017 0734   K 3.8 05/27/2017 0734   CL 103 06/22/2014 0945   CO2 25 05/27/2017 0734   BUN 12.2 05/27/2017 0734   CREATININE 0.9 05/27/2017 0734   GLU 105 01/10/2016      Component Value Date/Time   CALCIUM 9.5 05/27/2017 0734   ALKPHOS 68 05/27/2017 0734   AST 19 05/27/2017 0734   ALT 15 05/27/2017 0734   BILITOT 0.88 05/27/2017 0734         Impression and Plan:   76 year old gentleman with the following issues:  1. Prostate cancer diagnosed in May 2015. He had a PSA of 15.6 and a Gleason score 4+5 = 9. He received local therapy with external beam radiation as well as seed implants boost completed in October 2015. His PSA nadir was to 0.17 in July 2016. PSA increased up to 6.2 with a testosterone of 697 in June 2017.  CT scan and bone scan obtained on 02/26/2016 showed no measurable disease.  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   He started Zytiga in August 2017 and has tolerated it well so far.  He continues to have excellent response with undetectable PSA. Risks and benefits of continuing this medication was reviewed today is agreeable to continue.  2. Androgen depravation: He will receive Lupron 22.5 mg injection every 3 months. He'll receive his injection today and in 3 months.  3. Polycythemia: Hemoglobin is normal at this time after smoking cessation.  4.Hypokalemia: Potassium is within normal range.  5. Hypertension: Blood pressure mildly elevated today but his blood pressure is  within normal range outside of his clinic visits.  6. Follow-up: Will be in 3 months.   Zola Button, MD 9/20/201811:46 AM

## 2017-06-03 NOTE — Telephone Encounter (Signed)
Gave patient AVS and calendar of upcoming December appointments.  °

## 2017-06-04 ENCOUNTER — Ambulatory Visit (INDEPENDENT_AMBULATORY_CARE_PROVIDER_SITE_OTHER): Payer: Medicare Other | Admitting: Cardiovascular Disease

## 2017-06-04 ENCOUNTER — Encounter: Payer: Self-pay | Admitting: Cardiovascular Disease

## 2017-06-04 ENCOUNTER — Ambulatory Visit (HOSPITAL_COMMUNITY)
Admission: RE | Admit: 2017-06-04 | Discharge: 2017-06-04 | Disposition: A | Discharge status: Home / Self Care | Payer: Medicare Other | Source: Ambulatory Visit | Attending: Cardiovascular Disease | Admitting: Cardiovascular Disease

## 2017-06-04 VITALS — BP 128/86 | HR 50 | Ht 65.0 in | Wt 197.0 lb

## 2017-06-04 DIAGNOSIS — I10 Essential (primary) hypertension: Secondary | ICD-10-CM | POA: Diagnosis not present

## 2017-06-04 DIAGNOSIS — Z87891 Personal history of nicotine dependence: Secondary | ICD-10-CM

## 2017-06-04 DIAGNOSIS — I714 Abdominal aortic aneurysm, without rupture, unspecified: Secondary | ICD-10-CM

## 2017-06-04 DIAGNOSIS — I2510 Atherosclerotic heart disease of native coronary artery without angina pectoris: Secondary | ICD-10-CM | POA: Diagnosis not present

## 2017-06-04 DIAGNOSIS — E785 Hyperlipidemia, unspecified: Secondary | ICD-10-CM | POA: Insufficient documentation

## 2017-06-04 DIAGNOSIS — I452 Bifascicular block: Secondary | ICD-10-CM

## 2017-06-04 DIAGNOSIS — I495 Sick sinus syndrome: Secondary | ICD-10-CM

## 2017-06-04 DIAGNOSIS — Z79899 Other long term (current) drug therapy: Secondary | ICD-10-CM | POA: Diagnosis not present

## 2017-06-04 NOTE — Patient Instructions (Signed)
Dr Sallyanne Kuster recommends that you continue on your current medications as directed. Please refer to the Current Medication list given to you today.  Your physician recommends that you return for lab work at your convenience - FASTING. OKAY to wait until December to have this completed.  Dr Sallyanne Kuster recommends that you schedule a follow-up appointment in 12 months. You will receive a reminder letter in the mail two months in advance. If you don't receive a letter, please call our office to schedule the follow-up appointment.  If you need a refill on your cardiac medications before your next appointment, please call your pharmacy.

## 2017-06-04 NOTE — Progress Notes (Signed)
Cardiology Consultation Note    Date:  06/05/2017   ID:  Sammie, Denner 28-Jul-1941, MRN [id number]  PCP:  [name], [name], MD  Cardiologist:   Sanda Klein, MD  Reason for consultation: Arterial calcifications noted on CT, abnormal ECG  Chief Complaint  Patient presents with  . Follow-up    yearly f/u no complaints today , low energy    History of Present Illness:  Tony [name] is a 76 y.o. male with asymptomatic peripheral and coronary arterial atherosclerosis and heavy calcification on imaging studies performed for workup of prostate cancer.   CT of the abdomen in 2018 disclosed a small fusiform infrarenal abdominal aortic aneurysm up to 3.1 cm in maximum diameter, Recent abdominal duplex ultrasound measures it at 3.3 cm. He does not have a history of clinically evident peripheral arterial disease or coronary artery disease. He has not smoked since his last appointment.  Last year his nuclear stress test was a low risk study with a small/mild area of ischemia in the apex. EF 59%. He has not had angina pectoris.  He denies claudication, but his legs feel unstable and he uses a cane. Is not had syncope, palpitations, dizziness, chest pain or dyspnea since his last appointment. His biggest complaint is constant fatigue and he wonders whether this is related to his chemotherapy.  His heart rate is 50 bpm. ECG shows sinus rhythm with first-degree AV block and right bundle branch block. Has left axis deviation but does not quite meet criteria for left anterior fascicular block today. T-wave inversions in the lateral leads, not significantly changed from previous tracings.  He is now on second line chemotherapy for prostate cancer refractory to antiandrogen therapy, .  Past Medical History:  Diagnosis Date  . Hypertension    no meds  . Prostate cancer ([place]) 02/08/14   Gleason 4+5=9, PSA 15.65  . Radiation   . Sinus problem     Past Surgical History:    Procedure Laterality Date  . PROSTATE BIOPSY  02/08/14   gleason 4+5=9, 12/12 cores positive, 54 gm  . RADIOACTIVE SEED IMPLANT N/A 06/29/2014   Procedure: RADIOACTIVE SEED IMPLANT;  Surgeon: Bernestine Amass, MD;  Location: [facility];  Service: Urology;  Laterality: N/A;    Current Medications: Outpatient Medications Prior to Visit  Medication Sig Dispense Refill  . amLODipine (NORVASC) 2.5 MG tablet Take 1 tablet (2.5 mg total) by mouth daily. 90 tablet 2  . fluticasone (FLONASE) 50 MCG/ACT nasal spray Place 1 spray into both nostrils daily as needed for allergies or rhinitis.    [name] LORazepam (ATIVAN) 1 MG tablet TAKE 1/2 TO 1 TABLET BY MOUTH DAILY AS NEEDED 30 tablet 3  . Multiple Vitamin (MULTIVITAMIN WITH MINERALS) TABS tablet Take 1 tablet by mouth daily.    . naproxen sodium (ANAPROX) 220 MG tablet Take 220 mg by mouth 2 (two) times daily as needed (for soreness).     . predniSONE (DELTASONE) 5 MG tablet Take 1 tablet (5 mg total) by mouth daily with breakfast. 90 tablet 0  . traZODone (DESYREL) 50 MG tablet Take 0.5-1 tablets (25-50 mg total) by mouth at bedtime as needed for sleep. 90 tablet 1  . triamcinolone (KENALOG) 0.025 % ointment Apply 1 application topically 2 (two) times daily. 30 g 0  . ZYTIGA 250 MG tablet TAKE 4 TABLETS BY MOUTH DAILY. TAKE ON AN EMPTY STOMACH 1 HOUR BEFORE OR 2 HOURS AFTER A MEAL 120 tablet 0  No facility-administered medications prior to visit.      Allergies:   Penicillins   Social History   Social History  . Marital status: Married    Spouse name: N/A  . Number of children: N/A  . Years of education: N/A   Social History Main Topics  . Smoking status: Former Smoker    Types: Cigars  . Smokeless tobacco: Never Used  . Alcohol use Yes     Comment: 1 beer daily  . Drug use: No  . Sexual activity: Not Currently   Other Topics Concern  . None   Social History Narrative  . None     Family History:  The patient's  family history includes Alzheimer's disease in his father and mother; Arthritis in his sister; Cancer in his brother and father.   ROS:   Please see the history of present illness.    ROS All other systems reviewed and are negative.   PHYSICAL EXAM:   VS:  BP 128/86   Pulse (!) 50   Ht 5\' 5"  (1.651 m)   Wt 197 lb (89.4 kg)   BMI 32.78 kg/m    GEN: Well nourished, well developed, in no acute distress  HEENT: normal  Neck: no JVD, carotid bruits, or masses Cardiac: RRR; no murmurs, rubs, or gallops,no edema  Respiratory:  clear to auscultation bilaterally, normal work of breathing GI: soft, nontender, nondistended, + BS MS: no deformity or atrophy  Skin: warm and dry, no rash Neuro:  Alert and Oriented x 3, Strength and sensation are intact Psych: euthymic mood, full affect  Wt Readings from Last 3 Encounters:  06/04/17 197 lb (89.4 kg)  06/03/17 197 lb (89.4 kg)  06/01/17 196 lb 8 oz (89.1 kg)      Studies/Labs Reviewed:   EKG:  EKG is ordered today.  The ekg ordered today demonstrates Sinus rhythm, first-degree AV block, right bundle branch block, left axis deviation not meeting criteria for anterior fascicular block, lateral T-wave inversion.  Recent Labs: 05/27/2017: ALT 15; BUN 12.2; Creatinine 0.9; HGB 16.1; Platelets 143; Potassium 3.8; Sodium 141   Lipid Panel    Component Value Date/Time   CHOL 179 01/10/2016   TRIG 60 01/10/2016   HDL 52 01/10/2016   ASSESSMENT:    1. Sick sinus syndrome ([place])   2. Bifascicular block   3. Coronary artery calcification seen on CAT scan   4. 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[place])   5. Dyslipidemia   6. Former cigarette smoker   7. Essential hypertension, benign   8. Medication management      PLAN:  In order of problems listed above:  1. Sinus bradycardia/SSS: I think it's quite likely that his fatigue is related to sinus bradycardia and chronotropic incompetence. However he has not had syncope or near  syncope. I told him that I think he would probably have more energy with a pacemaker but he seems reluctant to consider any invasive procedures.  2. RBBB+LAFB+long PR: Confirms presence of age-related conduction system disease, but has never had high-grade AV block. Asked him to promptly call if he develops syncope or near syncope in which case I think he really should get a pacemaker. 3. Coronary calcification on CT: Low risk stress test last year. He states that he is taking aspirin 81 mg daily, although this is not on his list of medications. 4. AAA: Small and roughly same in size as it was a year ago, allowing for differences in the  measurement techniques. Yearly ultrasound is appropriate. He cannot take beta blockers due to bradycardia.  5. HLP: Target LDL-C less than 100. Needs an updated lipid profile.  6. Congratulated him for staying off cigarettes. 7. HTN: Good blood pressure control on low-dose amlodipine monotherapy.   Medication Adjustments/Labs and Tests Ordered: Current medicines are reviewed at length with the patient today.  Concerns regarding medicines are outlined above.  Medication changes, Labs and Tests ordered today are listed in the Patient Instructions below. Patient Instructions  Dr Sallyanne Kuster recommends that you continue on your current medications as directed. Please refer to the Current Medication list given to you today.  Your physician recommends that you return for lab work at your convenience - FASTING. OKAY to wait until December to have this completed.  Dr Sallyanne Kuster recommends that you schedule a follow-up appointment in 12 months. You will receive a reminder letter in the mail two months in advance. If you don't receive a letter, please call our office to schedule the follow-up appointment.  If you need a refill on your cardiac medications before your next appointment, please call your pharmacy.    Signed, Sanda Klein, MD  06/05/2017 4:10 PM    [facility] [address], [place], [place]  [address] Phone: [phone]; Fax: [phone]

## 2017-06-10 MED FILL — ZYTIGA 250 MG TABLET: 250 | 30 days supply | Qty: 120 | Fill #0

## 2017-06-29 ENCOUNTER — Ambulatory Visit: Payer: Medicare Other | Admitting: Family Medicine

## 2017-06-30 NOTE — Telephone Encounter (Signed)
Oral Oncology Patient Advocate Encounter  Was successful in securing patient an $28 grant from Patient [place] (PAF) to provide copayment coverage for his Zytiga.  This will continue to  keep the out of pocket expense at $0.    I have spoken with the patient's wife and shared the good news.      The billing information is as follows and has been shared with [place].   Member ID: [id number] Group ID: [id number] RxBin: [id number] Dates of Eligibility: 05/06/2017 through 06/28/2018  Tony [name]. [name], [place] Patient [place] [phone] 06/30/2017 11:31 AM

## 2017-07-02 ENCOUNTER — Other Ambulatory Visit: Payer: Self-pay | Admitting: Oncology

## 2017-07-05 ENCOUNTER — Other Ambulatory Visit: Payer: Self-pay | Admitting: *Deleted

## 2017-07-05 DIAGNOSIS — I714 Abdominal aortic aneurysm, without rupture, unspecified: Secondary | ICD-10-CM

## 2017-07-08 MED FILL — ZYTIGA 250 MG TABLET: 250 | 30 days supply | Qty: 120 | Fill #0

## 2017-07-14 ENCOUNTER — Telehealth: Payer: Self-pay | Admitting: Pharmacist

## 2017-07-14 NOTE — Telephone Encounter (Signed)
Oral Chemotherapy Pharmacist Encounter  Follow-Up Form  Called patient today to follow up regarding patient's oral chemotherapy medication: Zytiga (abiraterone) for the treatment of metastatic, castration-resistant prostate cancer, in conjunction with prednisone, planned duration until disease progression or unacceptable toxicity.  Original Start date of oral chemotherapy: 05/07/16  Pt is doing well today  Pt reports 0 doses of Zytiga 250mg  tablets, 4 tablets (1000mg ) by mouth once daily on an empty stomach, 1 hour before or 2 hours after meals, missed in the last month.  Patient continues to take Zytiga 1st thing in the morning, and take his prednisone 5mg  tablets once daily with breakfast.  Pt reports the following side effects: Mild constipation and fatigue. Both are manageable at this time.  Pertinent labs reviewed: OK for continued treatment.  Patient knows to call the office with questions or concerns. Oral Oncology Clinic will continue to follow.  Thank you,  Johny Drilling, PharmD, BCPS, BCOP 07/14/2017 3:58 PM Oral Oncology Clinic [phone]

## 2017-08-03 DIAGNOSIS — H66003 Acute suppurative otitis media without spontaneous rupture of ear drum, bilateral: Secondary | ICD-10-CM | POA: Diagnosis not present

## 2017-08-03 DIAGNOSIS — R05 Cough: Secondary | ICD-10-CM | POA: Diagnosis not present

## 2017-08-04 ENCOUNTER — Other Ambulatory Visit: Payer: Self-pay | Admitting: Oncology

## 2017-08-09 ENCOUNTER — Other Ambulatory Visit: Payer: Self-pay | Admitting: Family Medicine

## 2017-08-09 DIAGNOSIS — F418 Other specified anxiety disorders: Secondary | ICD-10-CM

## 2017-08-09 MED FILL — ZYTIGA 250 MG TABLET: 250 | 30 days supply | Qty: 120 | Fill #0

## 2017-08-11 NOTE — Telephone Encounter (Signed)
Rx was sent to his pharmacy. [Please advise pt to wait at least 48 hours after refill request before calling again with same request.] Thanks, BJ

## 2017-08-11 NOTE — Telephone Encounter (Signed)
Pt was a walk in for checking on the status of RX.

## 2017-08-23 ENCOUNTER — Other Ambulatory Visit: Payer: Self-pay | Admitting: Oncology

## 2017-08-27 ENCOUNTER — Other Ambulatory Visit (HOSPITAL_BASED_OUTPATIENT_CLINIC_OR_DEPARTMENT_OTHER): Payer: Medicare Other

## 2017-08-27 DIAGNOSIS — C61 Malignant neoplasm of prostate: Secondary | ICD-10-CM

## 2017-08-27 LAB — COMPREHENSIVE METABOLIC PANEL
ALT: 17 U/L (ref 0–55)
ANION GAP: 10 meq/L (ref 3–11)
AST: 23 U/L (ref 5–34)
Albumin: 4.1 g/dL (ref 3.5–5.0)
Alkaline Phosphatase: 83 U/L (ref 40–150)
BILIRUBIN TOTAL: 0.95 mg/dL (ref 0.20–1.20)
BUN: 16.2 mg/dL (ref 7.0–26.0)
CALCIUM: 9.8 mg/dL (ref 8.4–10.4)
CHLORIDE: 104 meq/L (ref 98–109)
CO2: 26 meq/L (ref 22–29)
Creatinine: 0.9 mg/dL (ref 0.7–1.3)
Glucose: 108 mg/dl (ref 70–140)
Potassium: 4.8 mEq/L (ref 3.5–5.1)
Sodium: 141 mEq/L (ref 136–145)
TOTAL PROTEIN: 7.3 g/dL (ref 6.4–8.3)

## 2017-08-27 LAB — CBC WITH DIFFERENTIAL/PLATELET
BASO%: 0.5 % (ref 0.0–2.0)
BASOS ABS: 0 10*3/uL (ref 0.0–0.1)
EOS%: 0.9 % (ref 0.0–7.0)
Eosinophils Absolute: 0.1 10*3/uL (ref 0.0–0.5)
HEMATOCRIT: 48.5 % (ref 38.4–49.9)
HEMOGLOBIN: 15.8 g/dL (ref 13.0–17.1)
LYMPH#: 0.8 10*3/uL — AB (ref 0.9–3.3)
LYMPH%: 12.7 % — ABNORMAL LOW (ref 14.0–49.0)
MCH: 31.6 pg (ref 27.2–33.4)
MCHC: 32.6 g/dL (ref 32.0–36.0)
MCV: 97 fL (ref 79.3–98.0)
MONO#: 0.3 10*3/uL (ref 0.1–0.9)
MONO%: 5 % (ref 0.0–14.0)
NEUT%: 80.9 % — ABNORMAL HIGH (ref 39.0–75.0)
NEUTROS ABS: 5.2 10*3/uL (ref 1.5–6.5)
Platelets: 154 10*3/uL (ref 140–400)
RBC: 5 10*6/uL (ref 4.20–5.82)
RDW: 13.4 % (ref 11.0–14.6)
WBC: 6.4 10*3/uL (ref 4.0–10.3)

## 2017-08-28 LAB — PSA

## 2017-08-31 ENCOUNTER — Other Ambulatory Visit: Payer: Self-pay | Admitting: Oncology

## 2017-09-03 ENCOUNTER — Ambulatory Visit (HOSPITAL_BASED_OUTPATIENT_CLINIC_OR_DEPARTMENT_OTHER): Payer: Medicare Other

## 2017-09-03 ENCOUNTER — Telehealth: Payer: Self-pay | Admitting: Oncology

## 2017-09-03 ENCOUNTER — Ambulatory Visit: Payer: Medicare Other | Admitting: Oncology

## 2017-09-03 VITALS — BP 131/88 | HR 86 | Temp 98.7°F | Resp 17 | Ht 65.0 in | Wt 214.0 lb

## 2017-09-03 VITALS — BP 165/88 | HR 86 | Temp 97.9°F | Resp 20

## 2017-09-03 DIAGNOSIS — C61 Malignant neoplasm of prostate: Secondary | ICD-10-CM | POA: Diagnosis not present

## 2017-09-03 DIAGNOSIS — I10 Essential (primary) hypertension: Secondary | ICD-10-CM | POA: Diagnosis not present

## 2017-09-03 DIAGNOSIS — Z5111 Encounter for antineoplastic chemotherapy: Secondary | ICD-10-CM | POA: Diagnosis not present

## 2017-09-03 DIAGNOSIS — D751 Secondary polycythemia: Secondary | ICD-10-CM | POA: Diagnosis not present

## 2017-09-03 DIAGNOSIS — E876 Hypokalemia: Secondary | ICD-10-CM | POA: Diagnosis not present

## 2017-09-03 MED ORDER — LEUPROLIDE ACETATE (4 MONTH) 30 MG IM KIT
22.5000 mg | PACK | Freq: Once | INTRAMUSCULAR | Status: AC
  Administered 2017-09-03: 22.5 mg via INTRAMUSCULAR
  Filled 2017-09-03: qty 30

## 2017-09-03 NOTE — Telephone Encounter (Signed)
Scheduled appt per 12/21 los - Gave patient AVS and calender per los.  

## 2017-09-03 NOTE — Patient Instructions (Signed)
Leuprolide depot injection What is this medicine? LEUPROLIDE (loo PROE lide) is a man-made protein that acts like a natural hormone in the body. It decreases testosterone in men and decreases estrogen in women. In men, this medicine is used to treat advanced prostate cancer. In women, some forms of this medicine may be used to treat endometriosis, uterine fibroids, or other male hormone-related problems. This medicine may be used for other purposes; ask your health care provider or pharmacist if you have questions. COMMON BRAND NAME(S): Eligard, Lupron Depot, Lupron Depot-Ped, Viadur What should I tell my health care provider before I take this medicine? They need to know if you have any of these conditions: -diabetes -heart disease or previous heart attack -high blood pressure -high cholesterol -mental illness -osteoporosis -pain or difficulty passing urine -seizures -spinal cord metastasis -stroke -suicidal thoughts, plans, or attempt; a previous suicide attempt by you or a family member -tobacco smoker -unusual vaginal bleeding (women) -an unusual or allergic reaction to leuprolide, benzyl alcohol, other medicines, foods, dyes, or preservatives -pregnant or trying to get pregnant -breast-feeding How should I use this medicine? This medicine is for injection into a muscle or for injection under the skin. It is given by a health care professional in a hospital or clinic setting. The specific product will determine how it will be given to you. Make sure you understand which product you receive and how often you will receive it. Talk to your pediatrician regarding the use of this medicine in children. Special care may be needed. Overdosage: If you think you have taken too much of this medicine contact a poison control center or emergency room at once. NOTE: This medicine is only for you. Do not share this medicine with others. What if I miss a dose? It is important not to miss a dose.  Call your doctor or health care professional if you are unable to keep an appointment. Depot injections: Depot injections are given either once-monthly, every 12 weeks, every 16 weeks, or every 24 weeks depending on the product you are prescribed. The product you are prescribed will be based on if you are male or male, and your condition. Make sure you understand your product and dosing. What may interact with this medicine? Do not take this medicine with any of the following medications: -chasteberry This medicine may also interact with the following medications: -herbal or dietary supplements, like black cohosh or DHEA -male hormones, like estrogens or progestins and birth control pills, patches, rings, or injections -male hormones, like testosterone This list may not describe all possible interactions. Give your health care provider a list of all the medicines, herbs, non-prescription drugs, or dietary supplements you use. Also tell them if you smoke, drink alcohol, or use illegal drugs. Some items may interact with your medicine. What should I watch for while using this medicine? Visit your doctor or health care professional for regular checks on your progress. During the first weeks of treatment, your symptoms may get worse, but then will improve as you continue your treatment. You may get hot flashes, increased bone pain, increased difficulty passing urine, or an aggravation of nerve symptoms. Discuss these effects with your doctor or health care professional, some of them may improve with continued use of this medicine. Male patients may experience a menstrual cycle or spotting during the first months of therapy with this medicine. If this continues, contact your doctor or health care professional. What side effects may I notice from receiving this medicine? Side   effects that you should report to your doctor or health care professional as soon as possible: -allergic reactions like skin  rash, itching or hives, swelling of the face, lips, or tongue -breathing problems -chest pain -depression or memory disorders -pain in your legs or groin -pain at site where injected or implanted -seizures -severe headache -swelling of the feet and legs -suicidal thoughts or other mood changes -visual changes -vomiting Side effects that usually do not require medical attention (report to your doctor or health care professional if they continue or are bothersome): -breast swelling or tenderness -decrease in sex drive or performance -diarrhea -hot flashes -loss of appetite -muscle, joint, or bone pains -nausea -redness or irritation at site where injected or implanted -skin problems or acne This list may not describe all possible side effects. Call your doctor for medical advice about side effects. You may report side effects to FDA at 1-800-FDA-1088. Where should I keep my medicine? This drug is given in a hospital or clinic and will not be stored at home. NOTE: This sheet is a summary. It may not cover all possible information. If you have questions about this medicine, talk to your doctor, pharmacist, or health care provider.  2018 Elsevier/Gold Standard (2016-02-13 09:45:53)  

## 2017-09-03 NOTE — Progress Notes (Signed)
Hematology and Oncology Follow Up Visit  Tony [name] [id number] Aug 10, 1941 76 y.o. 09/03/2017 9:10 AM Tony [name], [name], [name], MD   Principle Diagnosis: 76 year old gentleman with prostate cancer diagnosed in May 2015. His PSA was 15.7, Gleason score is 4+5 = 9 with 12 out of 12 cores involved. His initial clinical staging is T3a N0.  Castration resistant disease at this time with biochemical relapse.   Prior Therapy: He is status post radiation therapy for external beam radiation completed in September 2015 for a total of 45 gray. He also status post seed implant brachytherapy boost completed in October 2015. He received total of 1 year of androgen deprivation 22,015 and 2016. His PSA nadir was to 0.47 in November 2016. His PSA was 4.08 in May 2017.  Current therapy:  Androgen deprivation in the form of Lupron 22.5 mg resumed on 03/02/2016. This will be given every 3 months. Zytiga 1000 mg with prednisone started around August 24 of 2017.   Interim History: [name] presents today for a follow-up visit. Since the last visit, he reports no recent complaints. He continues to take Zytiga without any complications. He denied any difficulties obtaining or taking this medication. He does not report any nausea, abdominal pain or peripheral neuropathy. He did not report any lower extremity edema.  He checks his blood pressure periodically at home and has been within normal range. He reports no complications related to Lupron.  Denies any major changes in hot flashes.  His appetite, performance status and quality of life is unchanged.  He does not report any headaches blurred vision or double vision. He does not report any fevers, chills or sweats. He does not report any chest pain shortness of breath cough or hemoptysis. Does not report any palpitation orthopnea or PND.  He does not report any nausea, vomiting, abdominal pain, or satiety or change in his bowel habits. He does not  report any musca skeletal complaints. He does not report any skin rashes or lymphadenopathy. Rest of his review of systems unremarkable.   Medications: I have reviewed the patient's current medications.  Current Outpatient Medications  Medication Sig Dispense Refill  . amLODipine (NORVASC) 2.5 MG tablet Take 1 tablet (2.5 mg total) by mouth daily. 90 tablet 2  . fluticasone (FLONASE) 50 MCG/ACT nasal spray Place 1 spray into both nostrils daily as needed for allergies or rhinitis.    [name] LORazepam (ATIVAN) 1 MG tablet TAKE 1/2 TO 1 TABLET DAILY AS NEEDED 30 tablet 2  . Multiple Vitamin (MULTIVITAMIN WITH MINERALS) TABS tablet Take 1 tablet by mouth daily.    . naproxen sodium (ANAPROX) 220 MG tablet Take 220 mg by mouth 2 (two) times daily as needed (for soreness).     . predniSONE (DELTASONE) 5 MG tablet TAKE 1 TABLET (5 MG TOTAL) BY MOUTH DAILY WITH BREAKFAST. 90 tablet 0  . traZODone (DESYREL) 50 MG tablet Take 0.5-1 tablets (25-50 mg total) by mouth at bedtime as needed for sleep. 90 tablet 1  . triamcinolone (KENALOG) 0.025 % ointment Apply 1 application topically 2 (two) times daily. 30 [name] 0  . ZYTIGA 250 MG tablet TAKE 4 TABLETS BY MOUTH DAILY. TAKE ON AN EMPTY STOMACH 1 HOUR BEFORE OR 2 HOURS AFTER A MEAL 120 tablet 0   No current facility-administered medications for this visit.      Allergies:  Allergies  Allergen Reactions  . Penicillins Rash    Past Medical History, Surgical history, Social history, and Family  History were reviewed and updated.   Physical Exam: Blood pressure 131/88, pulse 86, temperature 98.7 F (37.1 C), temperature source Oral, resp. rate 17, height 5\' 5"  (1.651 m), weight 214 lb (97.1 kg), SpO2 96 %.   ECOG: 1 General appearance: Well-appearing gentleman without distress. Head: Normocephalic, without obvious abnormality no oral thrush or ulcers. Neck: no adenopathy no thyroid masses. Lymph nodes: Cervical, supraclavicular, and axillary nodes  normal. Heart:regular rate and rhythm, S1, S2 normal, no murmur, click, rub or gallop Lung:chest clear, no wheezing, rales, normal symmetric air entry Abdomin: soft, non-tender, without masses or organomegaly no rebound or guarding. EXT:no erythema, induration, or nodules   Lab Results: Lab Results  Component Value Date   WBC 6.4 08/27/2017   HGB 15.8 08/27/2017   HCT 48.5 08/27/2017   MCV 97.0 08/27/2017   PLT 154 08/27/2017     Chemistry      Component Value Date/Time   NA 141 08/27/2017 0915   K 4.8 08/27/2017 0915   CL 103 06/22/2014 0945   CO2 26 08/27/2017 0915   BUN 16.2 08/27/2017 0915   CREATININE 0.9 08/27/2017 0915   GLU 105 01/10/2016      Component Value Date/Time   CALCIUM 9.8 08/27/2017 0915   ALKPHOS 83 08/27/2017 0915   AST 23 08/27/2017 0915   ALT 17 08/27/2017 0915   BILITOT 0.95 08/27/2017 0915         Impression and Plan:   76 year old gentleman with the following issues:  1. Prostate cancer diagnosed in May 2015. He had a PSA of 15.6 and a Gleason score 4+5 = 9. He received local therapy with external beam radiation as well as seed implants boost completed in October 2015. His PSA nadir was to 0.17 in July 2016. PSA increased up to 6.2 with a testosterone of 697 in June 2017.  CT scan and bone scan obtained on 02/26/2016 showed no measurable disease.  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   He started Zytiga in August 6629 without complications.  His PSA continues to be undetectable and he has no issues with continuing this medication.  Risks and benefits of continuing this therapy was discussed and he is agreeable.  2. Androgen depravation: He will receive Lupron 22.5 mg injection every 3 months.  No changes at this time.  3. Polycythemia: Hemoglobin is normal at this time after smoking cessation.  4.Hypokalemia: Potassium is within normal range.  5. Hypertension: Blood pressure mildly  elevated today but his blood pressure is within normal range when it is checked at home.  6. Follow-up: Will be in 3 months.   Tony Button, MD 12/21/20189:10 AM

## 2017-09-09 MED FILL — ZYTIGA 250 MG TABLET: 250 | 30 days supply | Qty: 120 | Fill #0

## 2017-09-14 HISTORY — PX: APPENDECTOMY: SHX54

## 2017-09-16 ENCOUNTER — Telehealth: Payer: Self-pay | Admitting: Pharmacy Technician

## 2017-09-16 NOTE — Telephone Encounter (Signed)
Oral Oncology Patient Advocate Encounter  Was successful in securing patient an $ 7500 grant from Patient [place] [facility]) to provide copayment coverage for his Zytiga.  This will keep the out of pocket expense at $0.    I have left a message for the patient.    The billing information is as follows and has been shared with [place].   Member ID: [id number] Group ID: [id number] RxBin: [id number] PCN: [id number]   Dates of Eligibility: 10/07/2017 through 10/06/2018  Tony [name]. [name], [place] Patient [place] [phone] 09/16/2017 10:15 AM

## 2017-10-04 ENCOUNTER — Other Ambulatory Visit: Payer: Self-pay | Admitting: Oncology

## 2017-10-11 MED FILL — ZYTIGA 250 MG TABLET: 250 | 30 days supply | Qty: 120 | Fill #0

## 2017-10-18 NOTE — Progress Notes (Signed)
HPI:   Tony [name] is a 77 y.o. male, who is here today for 6 months follow up.   He was last seen on 06/01/2017.  Since his last OV she has seen cardiologist, Dr. Sallyanne Kuster; oncologist, Dr. [name]. He has also been seen in acute care and diagnosed with acute suppurative otitis.  Hypertension: Currently he is on Amlodipine 2.5 mg daily.  Denies severe/frequent headache, visual changes, chest pain, dyspnea, palpitation, focal weakness, or edema.  Lab Results  Component Value Date   CREATININE 0.9 08/27/2017   BUN 16.2 08/27/2017   NA 141 08/27/2017   K 4.8 08/27/2017   CL 103 06/22/2014   CO2 26 08/27/2017    Insomnia and anxiety:  He is currently on Trazodone 50 mg, 1/2-1 tablet at bedtime.  She is not sure about benefit from medication but his wife has noted a better sleep. He still gets up once or twice a night to urinate but according to wife, this is much better that before he started medication.  He is also on Lorazepam 1 mg daily as needed for anxiety. He is sleeping about 6-7 hours.  He is tolerating medication well, he denies side effects.    HLD:  He is currently on nonpharmacologic treatment.  Lab Results  Component Value Date   CHOL 179 01/10/2016   HDL 52 01/10/2016   TRIG 60 01/10/2016    His wife is also concerned about skin lesion on left ear for a "a while", at least months.She has been applying OTC abx oint but it has not helped. He has no had any pain,pruritus,or easy bleeding. No history of skin cancer.  He is concerned about cerumen accumulation that causes intermittent hearing loss and earache.  He recently cleaned his ear and removed cerumen,symptoms resolved. He wonders if there is something he can use to prevent future problems.    Review of Systems  Constitutional: Positive for fatigue. Negative for activity change, appetite change and fever.  HENT: Negative for ear discharge, facial swelling, nosebleeds, sore  throat and trouble swallowing.   Eyes: Negative for redness and visual disturbance.  Respiratory: Negative for apnea, cough, shortness of breath and wheezing.   Cardiovascular: Negative for chest pain, palpitations and leg swelling.  Gastrointestinal: Negative for abdominal pain, nausea and vomiting.  Genitourinary: Negative for decreased urine volume, dysuria and hematuria.  Musculoskeletal: Negative for gait problem and myalgias.  Skin: Negative for rash and wound.  Allergic/Immunologic: Positive for environmental allergies.  Neurological: Negative for syncope, weakness and headaches.  Psychiatric/Behavioral: Positive for sleep disturbance. Negative for confusion. The patient is nervous/anxious.      Current Outpatient Medications on File Prior to Visit  Medication Sig Dispense Refill  . fluticasone (FLONASE) 50 MCG/ACT nasal spray Place 1 spray into both nostrils daily as needed for allergies or rhinitis.    . Multiple Vitamin (MULTIVITAMIN WITH MINERALS) TABS tablet Take 1 tablet by mouth daily.    . naproxen sodium (ANAPROX) 220 MG tablet Take 220 mg by mouth 2 (two) times daily as needed (for soreness).     . predniSONE (DELTASONE) 5 MG tablet TAKE 1 TABLET (5 MG TOTAL) BY MOUTH DAILY WITH BREAKFAST. 90 tablet 0  . triamcinolone (KENALOG) 0.025 % ointment Apply 1 application topically 2 (two) times daily. 30 g 0  . ZYTIGA 250 MG tablet TAKE 4 TABLETS BY MOUTH DAILY. TAKE ON AN EMPTY STOMACH 1 HOUR BEFORE OR 2 HOURS AFTER A MEAL 120 tablet 0  No current facility-administered medications on file prior to visit.      Past Medical History:  Diagnosis Date  . Hypertension    no meds  . 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 Radiation   . Sinus problem    Allergies  Allergen Reactions  . Penicillins Rash    Social History   Socioeconomic History  . Marital status: Married    Spouse name: None  . Number of children: None  . Years of education: None    . Highest education level: None  Social Needs  . Financial resource strain: None  . Food insecurity - worry: None  . Food insecurity - inability: None  . Transportation needs - medical: None  . Transportation needs - non-medical: None  Occupational History  . None  Tobacco Use  . Smoking status: Former Smoker    Types: Cigars  . Smokeless tobacco: Never Used  Substance and Sexual Activity  . Alcohol use: Yes    Comment: 1 beer daily  . Drug use: No  . Sexual activity: Not Currently  Other Topics Concern  . None  Social History Narrative  . None    Vitals:   10/19/17 0918  BP: 134/86  Pulse: 78  Resp: 12  Temp: (!) 97.5 F (36.4 C)  SpO2: 96%   Body mass index is 33.49 kg/m.  Wt Readings from Last 3 Encounters:  10/19/17 201 lb 4 oz (91.3 kg)  09/03/17 214 lb (97.1 kg)  06/04/17 197 lb (89.4 kg)     Physical Exam  Nursing note and vitals reviewed. Constitutional: He is oriented to person, place, and time. He appears well-developed. No distress.  HENT:  Head: Normocephalic and atraumatic.  Right Ear: Tympanic membrane, external ear and ear canal normal.  Left Ear: Tympanic membrane, external ear and ear canal normal.  Ears:  Mouth/Throat: Oropharynx is clear and moist and mucous membranes are normal.  Left ear in auricula,above tragus there is a mild hyperpigmented lesion, 6 mm, with a 2-3 mm erythematous papular lesion. Not tender.  Eyes: Conjunctivae are normal. Pupils are equal, round, and reactive to light.  Cardiovascular: Normal rate and regular rhythm.  Occasional extrasystoles are present.  No murmur heard. Pulses:      Dorsalis pedis pulses are 2+ on the right side, and 2+ on the left side.  Respiratory: Effort normal and breath sounds normal. No respiratory distress.  GI: Soft. He exhibits no mass. There is no hepatomegaly. There is no tenderness.  Musculoskeletal: He exhibits no edema or tenderness.  Lymphadenopathy:    He has no cervical  adenopathy.  Neurological: He is alert and oriented to person, place, and time. He has normal strength.  Stable gait without assistance.  Skin: Skin is warm. Lesion (see ear graphic) noted. No rash noted. No erythema.  Psychiatric: His mood appears anxious. Cognition and memory are normal.  Well groomed, good eye contact.      ASSESSMENT AND PLAN:   Mr. Deovion Batrez Aitken was seen today for 6 months follow-up.  Orders Placed This Encounter  Procedures  . Lipid panel  . Ambulatory referral to Dermatology   Non-healing skin lesion  Possible diagnosis discussed, from benign lesions as SK,premalignant lesions ([place]), to [facility]. After discussion of some options, his wife would like for him to be evaluated by dermatologist.   He may need skin biopsy. Recommend protecting skin from direct sun exposure and to use sunscreen.  Essential hypertension, benign  Adequately controlled. No changes in current management. DASH and low salt diet to continue. Eye exam recommended annually,he is overdue. F/U in 6 months, before if needed.   Hypercholesteremia Further recommendations will be given according to lab results. Continue low fat diet.     Insomnia Well-controlled on current management, continue Trazodone 50 mg at bedtime. Some side effects review. Fall precautions discussed. Follow-up in 5-6 months.  Anxiety disorder Stable. No changes in Lorazepam 1 mg as needed at bedtime. Explained to patient and wife that 70-month supply cannot be provided given the fact this is a controlled medication. Follow-up in 6 months.     In regard to excess cerumen, I recommend OTC Debrox a couple times per week. This may help to prevent cerumen accumulation/impaction.  Currently he is asymptomatic.      -Mr. Makhai Fulco [place] was advised to return sooner than planned today if new concerns arise.       Samyra Limb G. [name], MD  [facility]. [place]  office.

## 2017-10-19 ENCOUNTER — Encounter: Payer: Self-pay | Admitting: Family Medicine

## 2017-10-19 ENCOUNTER — Ambulatory Visit (INDEPENDENT_AMBULATORY_CARE_PROVIDER_SITE_OTHER): Payer: Medicare Other | Admitting: Family Medicine

## 2017-10-19 VITALS — BP 134/86 | HR 78 | Temp 97.5°F | Resp 12 | Ht 65.0 in | Wt 201.2 lb

## 2017-10-19 DIAGNOSIS — I10 Essential (primary) hypertension: Secondary | ICD-10-CM

## 2017-10-19 DIAGNOSIS — L989 Disorder of the skin and subcutaneous tissue, unspecified: Secondary | ICD-10-CM

## 2017-10-19 DIAGNOSIS — F418 Other specified anxiety disorders: Secondary | ICD-10-CM

## 2017-10-19 DIAGNOSIS — G4700 Insomnia, unspecified: Secondary | ICD-10-CM

## 2017-10-19 DIAGNOSIS — E7800 Pure hypercholesterolemia, unspecified: Secondary | ICD-10-CM

## 2017-10-19 LAB — LIPID PANEL
CHOL/HDL RATIO: 3
Cholesterol: 200 mg/dL (ref 0–200)
HDL: 74.3 mg/dL (ref >39.00)
LDL CALC: 107 mg/dL — AB (ref 0–99)
NONHDL: 125.74
Triglycerides: 92 mg/dL (ref 0.0–149.0)
VLDL: 18.4 mg/dL (ref 0.0–40.0)

## 2017-10-19 MED ORDER — LORAZEPAM 1 MG PO TABS: ORAL_TABLET | ORAL | 3 refills | Status: DC

## 2017-10-19 MED ORDER — TRAZODONE HCL 50 MG PO TABS: 25.0000 mg | ORAL_TABLET | Freq: Every evening | ORAL | 2 refills | Status: DC | PRN

## 2017-10-19 MED ORDER — AMLODIPINE BESYLATE 2.5 MG PO TABS: 2.5000 mg | ORAL_TABLET | Freq: Every day | ORAL | 2 refills | Status: DC

## 2017-10-19 NOTE — Assessment & Plan Note (Signed)
Adequately controlled. No changes in current management. DASH and low salt diet to continue. Eye exam recommended annually,he is overdue. F/U in 6 months, before if needed.

## 2017-10-19 NOTE — Assessment & Plan Note (Signed)
Well-controlled on current management, continue Trazodone 50 mg at bedtime. Some side effects review. Fall precautions discussed. Follow-up in 5-6 months.

## 2017-10-19 NOTE — Patient Instructions (Addendum)
A few things to remember from today's visit:   Hypercholesteremia - Plan: Lipid panel  Non-healing skin lesion - Plan: Ambulatory referral to Dermatology  Insomnia, unspecified type  Other specified anxiety disorders  Essential hypertension, benign  Debrox over the counter may help with wax accumulation.   No changes today.  Sun screening and avoidance of direct sun light.    Please be sure medication list is accurate. If a new problem present, please set up appointment sooner than planned today.

## 2017-10-19 NOTE — Assessment & Plan Note (Signed)
Further recommendations will be given according to lab results. Continue low fat diet.

## 2017-10-19 NOTE — Assessment & Plan Note (Signed)
Stable. No changes in Lorazepam 1 mg as needed at bedtime. Explained to patient and wife that 73-month supply cannot be provided given the fact this is a controlled medication. Follow-up in 6 months.

## 2017-11-01 ENCOUNTER — Other Ambulatory Visit: Payer: Self-pay | Admitting: Oncology

## 2017-11-01 ENCOUNTER — Telehealth: Payer: Self-pay | Admitting: Family Medicine

## 2017-11-01 NOTE — Telephone Encounter (Signed)
Copied from [place] [phone]. Topic: Quick Communication - Rx Refill/Question >> Nov 01, 2017  7:58 AM Synthia Innocent wrote: Medication: LORazepam (ATIVAN) 1 MG tablet    Has the patient contacted their pharmacy? No, made aware to contact pharmacy, wanted to sent request  (Agent: If no, request that the patient contact the pharmacy for the refill.)   Preferred Pharmacy (with phone number or street name):[facility]   Agent: Please be advised that RX refills may take up to 3 business days. We ask that you follow-up with your pharmacy.

## 2017-11-01 NOTE — Telephone Encounter (Deleted)
Copied from [place] [phone]. Topic: Quick Communication - Rx Refill/Question >> Nov 01, 2017  7:58 AM Synthia Innocent wrote: Medication: LORazepam (ATIVAN) 1 MG tablet    Has the patient contacted their pharmacy? No, made aware to contact pharmacy, wanted to sent request  (Agent: If no, request that the patient contact the pharmacy for the refill.)   Preferred Pharmacy (with phone number or street name):[facility]   Agent: Please be advised that RX refills may take up to 3 business days. We ask that you follow-up with your pharmacy.

## 2017-11-01 NOTE — Telephone Encounter (Signed)
Noted last Rx written for Lorazepam on 10/19/17, # 30; refills x 3.  Called and spoke with Mrs. Ruppert re: this.  Stated the pt. only has about 3 pills left;  reported he is taking 1/2 tablet BID.  Advised to check with pharmacy, and depending on the last refill, it may be too soon.  Verb. understanding.

## 2017-11-01 NOTE — Telephone Encounter (Signed)
Pt's wife requesting Cholesterol panel results; noted that the results have not been reviewed by Dr. [name].  Will send message to Dr. [name].  Wife in agreement with this.

## 2017-11-02 NOTE — Telephone Encounter (Signed)
Patient and wife informed of lab results and verbalized understanding.

## 2017-11-02 NOTE — Telephone Encounter (Addendum)
Labs have been reviewed and recommendations sent. Thanks

## 2017-11-03 DIAGNOSIS — L821 Other seborrheic keratosis: Secondary | ICD-10-CM | POA: Diagnosis not present

## 2017-11-03 DIAGNOSIS — L700 Acne vulgaris: Secondary | ICD-10-CM | POA: Diagnosis not present

## 2017-11-03 DIAGNOSIS — D485 Neoplasm of uncertain behavior of skin: Secondary | ICD-10-CM | POA: Diagnosis not present

## 2017-11-07 ENCOUNTER — Other Ambulatory Visit: Payer: Self-pay | Admitting: Family Medicine

## 2017-11-07 DIAGNOSIS — F418 Other specified anxiety disorders: Secondary | ICD-10-CM

## 2017-11-08 MED FILL — ZYTIGA 250 MG TABLET: 250 | 30 days supply | Qty: 120 | Fill #0

## 2017-11-16 ENCOUNTER — Other Ambulatory Visit: Payer: Self-pay

## 2017-11-16 DIAGNOSIS — L578 Other skin changes due to chronic exposure to nonionizing radiation: Secondary | ICD-10-CM | POA: Diagnosis not present

## 2017-11-16 DIAGNOSIS — D485 Neoplasm of uncertain behavior of skin: Secondary | ICD-10-CM | POA: Diagnosis not present

## 2017-11-16 DIAGNOSIS — L700 Acne vulgaris: Secondary | ICD-10-CM | POA: Diagnosis not present

## 2017-11-16 MED ORDER — PREDNISONE 5 MG PO TABS: 5.0000 mg | ORAL_TABLET | Freq: Every day | ORAL | 0 refills | Status: DC

## 2017-11-30 ENCOUNTER — Inpatient Hospital Stay: Payer: Medicare Other | Attending: Oncology

## 2017-11-30 ENCOUNTER — Ambulatory Visit (INDEPENDENT_AMBULATORY_CARE_PROVIDER_SITE_OTHER): Payer: Medicare Other

## 2017-11-30 VITALS — BP 130/80 | HR 74 | Ht 65.0 in | Wt 201.0 lb

## 2017-11-30 DIAGNOSIS — E876 Hypokalemia: Secondary | ICD-10-CM | POA: Diagnosis not present

## 2017-11-30 DIAGNOSIS — D751 Secondary polycythemia: Secondary | ICD-10-CM | POA: Diagnosis not present

## 2017-11-30 DIAGNOSIS — C61 Malignant neoplasm of prostate: Secondary | ICD-10-CM | POA: Insufficient documentation

## 2017-11-30 DIAGNOSIS — Z5111 Encounter for antineoplastic chemotherapy: Secondary | ICD-10-CM | POA: Diagnosis not present

## 2017-11-30 DIAGNOSIS — I10 Essential (primary) hypertension: Secondary | ICD-10-CM | POA: Diagnosis not present

## 2017-11-30 DIAGNOSIS — Z0000 Encounter for general adult medical examination without abnormal findings: Secondary | ICD-10-CM

## 2017-11-30 LAB — COMPREHENSIVE METABOLIC PANEL
ALT: 17 U/L (ref 0–55)
AST: 21 U/L (ref 5–34)
Albumin: 3.9 g/dL (ref 3.5–5.0)
Alkaline Phosphatase: 68 U/L (ref 40–150)
Anion gap: 9 (ref 3–11)
BUN: 17 mg/dL (ref 7–26)
CO2: 27 mmol/L (ref 22–29)
Calcium: 9.6 mg/dL (ref 8.4–10.4)
Chloride: 105 mmol/L (ref 98–109)
Creatinine, Ser: 0.93 mg/dL (ref 0.70–1.30)
GFR calc Af Amer: >60 mL/min (ref >60)
GFR calc non Af Amer: >60 mL/min (ref >60)
Glucose, Bld: 112 mg/dL (ref 70–140)
Potassium: 4.3 mmol/L (ref 3.5–5.1)
Sodium: 141 mmol/L (ref 136–145)
Total Bilirubin: 0.7 mg/dL (ref 0.2–1.2)
Total Protein: 6.8 g/dL (ref 6.4–8.3)

## 2017-11-30 LAB — CBC WITH DIFFERENTIAL/PLATELET
BASOS PCT: 1 %
Basophils Absolute: 0.1 10*3/uL (ref 0.0–0.1)
EOS ABS: 0.1 10*3/uL (ref 0.0–0.5)
Eosinophils Relative: 2 %
HEMATOCRIT: 45.4 % (ref 38.4–49.9)
Hemoglobin: 15.2 g/dL (ref 13.0–17.1)
Lymphocytes Relative: 13 %
Lymphs Abs: 0.9 10*3/uL (ref 0.9–3.3)
MCH: 31.6 pg (ref 27.2–33.4)
MCHC: 33.6 g/dL (ref 32.0–36.0)
MCV: 94.2 fL (ref 79.3–98.0)
MONO ABS: 0.5 10*3/uL (ref 0.1–0.9)
MONOS PCT: 8 %
Neutro Abs: 5.1 10*3/uL (ref 1.5–6.5)
Neutrophils Relative %: 76 %
Platelets: 167 10*3/uL (ref 140–400)
RBC: 4.82 MIL/uL (ref 4.20–5.82)
RDW: 13 % (ref 11.0–14.6)
WBC: 6.7 10*3/uL (ref 4.0–10.3)

## 2017-11-30 NOTE — Patient Instructions (Addendum)
Tony [name] , Thank you for taking time to come for your Medicare Wellness Visit. I appreciate your ongoing commitment to your health goals. Please review the following plan we discussed and let me know if I can assist you in the future.   Medicare Part B will cover the CologuardTM test once every three years for beneficiaries who meet all of the following criteria:  Age 76 to 28 years,  Asymptomatic (no signs or symptoms of colorectal disease including but not limited to lower gastrointestinal pain, blood in stool, positive guaiac fecal occult blood test or fecal immunochemical test), and  At average risk of developing colorectal cancer (no personal history of adenomatous polyps, colorectal cancer, or inflammatory bowel disease, including Crohn's Disease and ulcerative colitis; no family history of colorectal cancers or adenomatous polyps, familial adenomatous polyposis, or hereditary nonpolyposis colorectal cancer). You can review this and see if you are interested   A Tetanus is recommended every 10 years. Medicare covers a tetanus if you have a cut or wound; otherwise, there may be a charge. If you had not had a tetanus with pertusses, known as the Tdap, you can take this anytime.  Please take the tetanus with whooping cough    Will go to your eye apt in the near future; states this is scheduled  States he had the zoster;   Shingrix is a vaccine for the prevention of Shingles in Adults 50 and older.  If you are on Medicare, you can request a prescription from your doctor to be filled at a pharmacy.  Please check with your benefits regarding applicable copays or out of pocket expenses.  The Shingrix is given in 2 vaccines approx 8 weeks apart. You must receive the 2nd dose prior to 6 months from receipt of the first.     These are the goals we discussed: Goals    . Patient Stated     Get well and maintain health        This is a list of the screening recommended for you and due  dates:  Health Maintenance  Topic Date Due  . Tetanus Vaccine  07/25/1961  . Colon Cancer Screening  07/25/1992  . Pneumonia vaccines (1 of 2 - PCV13) 07/26/2007  . Flu Shot  Completed      Fall Prevention in the Home Falls can cause injuries. They can happen to people of all ages. There are many things you can do to make your home safe and to help prevent falls. What can I do on the outside of my home?  Regularly fix the edges of walkways and driveways and fix any cracks.  Remove anything that might make you trip as you walk through a door, such as a raised step or threshold.  Trim any bushes or trees on the path to your home.  Use bright outdoor lighting.  Clear any walking paths of anything that might make someone trip, such as rocks or tools.  Regularly check to see if handrails are loose or broken. Make sure that both sides of any steps have handrails.  Any raised decks and porches should have guardrails on the edges.  Have any leaves, snow, or ice cleared regularly.  Use sand or salt on walking paths during winter.  Clean up any spills in your garage right away. This includes oil or grease spills. What can I do in the bathroom?  Use night lights.  Install grab bars by the toilet and in the tub and shower.  Do not use towel bars as grab bars.  Use non-skid mats or decals in the tub or shower.  If you need to sit down in the shower, use a plastic, non-slip stool.  Keep the floor dry. Clean up any water that spills on the floor as soon as it happens.  Remove soap buildup in the tub or shower regularly.  Attach bath mats securely with double-sided non-slip rug tape.  Do not have throw rugs and other things on the floor that can make you trip. What can I do in the bedroom?  Use night lights.  Make sure that you have a light by your bed that is easy to reach.  Do not use any sheets or blankets that are too big for your bed. They should not hang down onto the  floor.  Have a firm chair that has side arms. You can use this for support while you get dressed.  Do not have throw rugs and other things on the floor that can make you trip. What can I do in the kitchen?  Clean up any spills right away.  Avoid walking on wet floors.  Keep items that you use a lot in easy-to-reach places.  If you need to reach something above you, use a strong step stool that has a grab bar.  Keep electrical cords out of the way.  Do not use floor polish or wax that makes floors slippery. If you must use wax, use non-skid floor wax.  Do not have throw rugs and other things on the floor that can make you trip. What can I do with my stairs?  Do not leave any items on the stairs.  Make sure that there are handrails on both sides of the stairs and use them. Fix handrails that are broken or loose. Make sure that handrails are as long as the stairways.  Check any carpeting to make sure that it is firmly attached to the stairs. Fix any carpet that is loose or worn.  Avoid having throw rugs at the top or bottom of the stairs. If you do have throw rugs, attach them to the floor with carpet tape.  Make sure that you have a light switch at the top of the stairs and the bottom of the stairs. If you do not have them, ask someone to add them for you. What else can I do to help prevent falls?  Wear shoes that: ? Do not have high heels. ? Have rubber bottoms. ? Are comfortable and fit you well. ? Are closed at the toe. Do not wear sandals.  If you use a stepladder: ? Make sure that it is fully opened. Do not climb a closed stepladder. ? Make sure that both sides of the stepladder are locked into place. ? Ask someone to hold it for you, if possible.  Clearly mark and make sure that you can see: ? Any grab bars or handrails. ? First and last steps. ? Where the edge of each step is.  Use tools that help you move around (mobility aids) if they are needed. These  include: ? Canes. ? Walkers. ? Scooters. ? Crutches.  Turn on the lights when you go into a dark area. Replace any light bulbs as soon as they burn out.  Set up your furniture so you have a clear path. Avoid moving your furniture around.  If any of your floors are uneven, fix them.  If there are any pets around you, be  aware of where they are.  Review your medicines with your doctor. Some medicines can make you feel dizzy. This can increase your chance of falling. Ask your doctor what other things that you can do to help prevent falls. This information is not intended to replace advice given to you by your health care provider. Make sure you discuss any questions you have with your health care provider. Document Released: 06/27/2009 Document Revised: 02/06/2016 Document Reviewed: 10/05/2014 Elsevier Interactive Patient Education  2018 [place] Maintenance, Male A healthy lifestyle and preventive care is important for your health and wellness. Ask your health care provider about what schedule of regular examinations is right for you. What should I know about weight and diet? Eat a Healthy Diet  Eat plenty of vegetables, fruits, whole grains, low-fat dairy products, and lean protein.  Do not eat a lot of foods high in solid fats, added sugars, or salt.  Maintain a Healthy Weight Regular exercise can help you achieve or maintain a healthy weight. You should:  Do at least 150 minutes of exercise each week. The exercise should increase your heart rate and make you sweat (moderate-intensity exercise).  Do strength-training exercises at least twice a week.  Watch Your Levels of Cholesterol and Blood Lipids  Have your blood tested for lipids and cholesterol every 5 years starting at 76 years of age. If you are at high risk for heart disease, you should start having your blood tested when you are 76 years old. You may need to have your cholesterol levels checked more  often if: ? Your lipid or cholesterol levels are high. ? You are older than 76 years of age. ? You are at high risk for heart disease.  What should I know about cancer screening? Many types of cancers can be detected early and may often be prevented. Lung Cancer  You should be screened every year for lung cancer if: ? You are a current smoker who has smoked for at least 30 years. ? You are a former smoker who has quit within the past 15 years.  Talk to your health care provider about your screening options, when you should start screening, and how often you should be screened.  Colorectal Cancer  Routine colorectal cancer screening usually begins at 76 years of age and should be repeated every 5-10 years until you are 76 years old. You may need to be screened more often if early forms of precancerous polyps or small growths are found. Your health care provider may recommend screening at an earlier age if you have risk factors for colon cancer.  Your health care provider may recommend using home test kits to check for hidden blood in the stool.  A small camera at the end of a tube can be used to examine your colon (sigmoidoscopy or colonoscopy). This checks for the earliest forms of colorectal cancer.  Prostate and Testicular Cancer  Depending on your age and overall health, your health care provider may do certain tests to screen for prostate and testicular cancer.  Talk to your health care provider about any symptoms or concerns you have about testicular or prostate cancer.  Skin Cancer  Check your skin from head to toe regularly.  Tell your health care provider about any new moles or changes in moles, especially if: ? There is a change in a mole's size, shape, or color. ? You have a mole that is larger than a pencil eraser.  Always use  sunscreen. Apply sunscreen liberally and repeat throughout the day.  Protect yourself by wearing long sleeves, pants, a wide-brimmed hat, and  sunglasses when outside.  What should I know about heart disease, diabetes, and high blood pressure?  If you are 7-4 years of age, have your blood pressure checked every 3-5 years. If you are 23 years of age or older, have your blood pressure checked every year. You should have your blood pressure measured twice-once when you are at a hospital or clinic, and once when you are not at a hospital or clinic. Record the average of the two measurements. To check your blood pressure when you are not at a hospital or clinic, you can use: ? An automated blood pressure machine at a pharmacy. ? A home blood pressure monitor.  Talk to your health care provider about your target blood pressure.  If you are between 62-50 years old, ask your health care provider if you should take aspirin to prevent heart disease.  Have regular diabetes screenings by checking your fasting blood sugar level. ? If you are at a normal weight and have a low risk for diabetes, have this test once every three years after the age of 58. ? If you are overweight and have a high risk for diabetes, consider being tested at a younger age or more often.  A one-time screening for abdominal aortic aneurysm (AAA) by ultrasound is recommended for men aged 34-75 years who are current or former smokers. What should I know about preventing infection? Hepatitis B If you have a higher risk for hepatitis B, you should be screened for this virus. Talk with your health care provider to find out if you are at risk for hepatitis B infection. Hepatitis C Blood testing is recommended for:  Everyone born from [birth year] through 1964.  Anyone with known risk factors for hepatitis C.  Sexually Transmitted Diseases (STDs)  You should be screened each year for STDs including gonorrhea and chlamydia if: ? You are sexually active and are younger than 76 years of age. ? You are older than 76 years of age and your health care provider tells you that you are  at risk for this type of infection. ? Your sexual activity has changed since you were last screened and you are at an increased risk for chlamydia or gonorrhea. Ask your health care provider if you are at risk.  Talk with your health care provider about whether you are at high risk of being infected with HIV. Your health care provider may recommend a prescription medicine to help prevent HIV infection.  What else can I do?  Schedule regular health, dental, and eye exams.  Stay current with your vaccines (immunizations).  Do not use any tobacco products, such as cigarettes, chewing tobacco, and e-cigarettes. If you need help quitting, ask your health care provider.  Limit alcohol intake to no more than 2 drinks per day. One drink equals 12 ounces of beer, 5 ounces of wine, or 1 ounces of hard liquor.  Do not use street drugs.  Do not share needles.  Ask your health care provider for help if you need support or information about quitting drugs.  Tell your health care provider if you often feel depressed.  Tell your health care provider if you have ever been abused or do not feel safe at home. This information is not intended to replace advice given to you by your health care provider. Make sure you discuss any questions you  have with your health care provider. Document Released: 02/27/2008 Document Revised: 04/29/2016 Document Reviewed: 06/04/2015 Elsevier Interactive Patient Education  [employer].

## 2017-11-30 NOTE — Progress Notes (Signed)
Subjective:   Tony [name] is a 77 y.o. male who presents for Medicare Annual/Subsequent preventive examination.  Reports health is fine OV 10/2017  Diet  cherrios for breakfast Lunch; sometimes wife cooks or peanut butter sandwich or cheese sandwich  BMI 33  Chol 200; HDL 74; trig 92    Exercise Running to the doctors  [place] d[facility] - medical problem    Cardiac Risk Factors include: advanced age (>2men, >53 women);hypertension;male gender;obesity (BMI >30kg/m2)(both parents lived to be 95 )   Health Maintenance Due  Topic Date Due  . TETANUS/TDAP  07/25/1961  . COLONOSCOPY  07/25/1992  . PNA vac Low Risk Adult (1 of 2 - PCV13) 07/26/2007    PSA 08/2017 Colonoscopy - defer as he is being tx for prostate cancer  Pneumonia vaccines - wife states he had these at [facility]  AAA on CT of the abd. 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  If you are on Medicare, you can request a prescription from your doctor to be filled at a pharmacy.  Please check with your benefits regarding applicable copays or out of pocket expenses.  The Shingrix is given in 2 vaccines approx 8 weeks apart. You must receive the 2nd dose prior to 6 months from receipt of the first.   Objective:    Vitals: BP 130/80   Pulse 74   Ht 5\' 5"  (1.651 m)   Wt 201 lb (91.2 kg)   SpO2 96%   BMI 33.45 kg/m   Body mass index is 33.45 kg/m.  Advanced Directives 11/30/2017 07/16/2016 06/05/2016 04/16/2016 03/02/2016 06/29/2014 06/03/2014  Does Patient Have a Medical Advance Directive? No No No No No No No  Would patient like information on creating a medical advance directive? - Yes - Scientist, clinical (histocompatibility and immunogenetics) given Yes - Scientist, clinical (histocompatibility and immunogenetics) given Yes - Scientist, clinical (histocompatibility and immunogenetics) given Yes -  Scientist, clinical (histocompatibility and immunogenetics) given Yes - Scientist, clinical (histocompatibility and immunogenetics) given -   Stated he deferred and will discuss with his wife   Tobacco Social History   Tobacco Use  Smoking Status Former Smoker  . Years: 60.00  . Types: Cigars  . Last attempt to quit: 09/14/2012  . Years since quitting: 5.2  Smokeless Tobacco Never Used  Tobacco Comment   little cigars; the small ones; quit smoking 76 yo      Counseling given: Yes Comment: little cigars; the small ones; quit smoking 77 yo     Hearing Screening   125Hz  250Hz  500Hz  1000Hz  2000Hz  3000Hz  4000Hz  6000Hz  8000Hz   Right ear:       100    Left ear:       100    Vision Screening Comments: Eyes checked but has been awhile   Clinical Intake:    Past Medical History:  Diagnosis Date  . Hypertension    no meds  . Prostate cancer ([place]) 02/08/14   Gleason 4+5=9, PSA 15.65  . Radiation   . Sinus problem    Past Surgical History:  Procedure Laterality Date  . PROSTATE BIOPSY  02/08/14   gleason 4+5=9, 12/12 cores positive, 54 gm  . RADIOACTIVE SEED IMPLANT N/A 06/29/2014   Procedure: RADIOACTIVE SEED IMPLANT;  Surgeon: Bernestine Amass, MD;  Location: [facility];  Service: Urology;  Laterality: N/A;   Family History  Problem Relation Age of Onset  . Alzheimer's disease Mother   . Alzheimer's disease Father   . Cancer Father        prostate  . Arthritis Sister   . Cancer Brother        prostate   Social History   Socioeconomic History  . Marital status: Married    Spouse name: Not on file  . Number of children: Not on file  . Years of education: Not on file  . Highest education level: Not on file  Social Needs  . Financial resource strain: Not on file  . Food insecurity - worry: Not on file  . Food insecurity - inability: Not on file  . Transportation needs - medical: Not on file  . Transportation needs - non-medical: Not on file  Occupational History  . Not on file  Tobacco Use  . Smoking status: Former Smoker     Years: 60.00    Types: Cigars    Last attempt to quit: 09/14/2012    Years since quitting: 5.2  . Smokeless tobacco: Never Used  . Tobacco comment: little cigars; the small ones; quit smoking 77 yo   Substance and Sexual Activity  . Alcohol use: Yes    Comment: 1 beer daily  . Drug use: No  . Sexual activity: Not Currently  Other Topics Concern  . Not on file  Social History Narrative  . Not on file    Outpatient Encounter Medications as of 11/30/2017  Medication Sig  . amLODipine (NORVASC) 2.5 MG tablet Take 1 tablet (2.5 mg total) by mouth daily.  . fluticasone (FLONASE) 50 MCG/ACT nasal spray Place 1 spray into both nostrils daily as needed for allergies or rhinitis.  [name] LORazepam (ATIVAN) 1 MG tablet TAKE 1/2 TO 1 TABLET DAILY AS NEEDED  . LORazepam (ATIVAN) 1 MG tablet TAKE 1/2 TO 1 TABLET DAILY AS NEEDED  . Multiple Vitamin (MULTIVITAMIN WITH MINERALS) TABS tablet Take 1 tablet by mouth daily.  . naproxen sodium (ANAPROX) 220 MG tablet Take 220 mg by mouth 2 (two) times daily as needed (for soreness).   . predniSONE (DELTASONE) 5 MG tablet Take 1 tablet (5 mg total) by mouth daily with breakfast.  . traZODone (DESYREL) 50 MG tablet Take 0.5-1 tablets (25-50 mg total) by mouth at bedtime as needed for sleep.  [name] triamcinolone (KENALOG) 0.025 % ointment Apply 1 application topically 2 (two) times daily.  [name] ZYTIGA 250 MG tablet TAKE 4 TABLETS BY MOUTH DAILY. TAKE ON AN EMPTY STOMACH 1 HOUR BEFORE OR 2 HOURS AFTER A MEAL   No facility-administered encounter medications on file as of 11/30/2017.     Activities of Daily Living In your present state of health, do you have any difficulty performing the following activities: 11/30/2017  Hearing? N  Vision? N  Walking or climbing stairs? N  Dressing or bathing? N  Doing errands, shopping? N  Preparing Food and eating ? N  Using the Toilet? N  In the past six months, have you accidently leaked urine? N  Do you have problems with  loss of bowel control? Y  Comment has some constipation  Managing your Medications? N  Managing your Finances? N  Housekeeping or managing your Housekeeping? N  Some recent data might be hidden    Patient Care Team: [name], [name], MD as PCP - General (Family Medicine)   Assessment:   This is a routine wellness examination for  Tony [name].  Exercise Activities and Dietary recommendations Current Exercise Habits: Home exercise routine(cuts and carries wood for wood stove; runs errands ), Time (Minutes): 60(up most of the day ), Intensity: Mild  Goals    . Patient Stated     Get well and maintain health        Fall Risk Fall Risk  11/30/2017 05/24/2014 05/18/2014 05/11/2014 05/04/2014  Falls in the past year? No No No No No   No falls but does use a cane at times  Timed Get Up and Go Performed: WNL  Home  Is one level    Depression Screen PHQ 2/9 Scores 11/30/2017 10/19/2017 05/24/2014 05/18/2014  PHQ - 2 Score 0 0 0 0    Cognitive Function MMSE - Mini Mental State Exam 11/30/2017  Orientation to time 5  Orientation to Place 5  Registration 3  Attention/ Calculation 1  Recall 1  Language- name 2 objects 2  Language- repeat 1  Language- follow 3 step command 3  Language- read & follow direction 1  Write a sentence 1  Copy design 1  Total score 24    Appears to be fairly functional with out memory issues interfering with his life at this time. Will recheck next year     Immunization History  Administered Date(s) Administered  . Influenza, High Dose Seasonal PF 05/05/2016, 06/01/2017      Screening Tests Health Maintenance  Topic Date Due  . TETANUS/TDAP  07/25/1961  . COLONOSCOPY  07/25/1992  . PNA vac Low Risk Adult (1 of 2 - PCV13) 07/26/2007  . INFLUENZA VACCINE  Completed       Plan:      PCP Notes   Health Maintenance Educated regarding the shingrix Will have tdap  If he gets injured  Will schedule an eye exam Had shingles; discussed the shingrix     Declines colonoscopy; did mention cologuard Will defer for now; not interested   Abnormal Screens  MMSE 26/30 Appears to be very functional; states his recall is not as good  Wife did not mention any concerns today  Referrals  none  Patient concerns; None; doing well with chemo  Nurse Concerns; none  Next PCP apt In 6 months - next apt in August    I have personally reviewed and noted the following in the patient's chart:   . Medical and social history . Use of alcohol, tobacco or illicit drugs  . Current medications and supplements . Functional ability and status . Nutritional status . Physical activity . Advanced directives . List of other physicians . Hospitalizations, surgeries, and ER visits in previous 12 months . Vitals . Screenings to include cognitive, depression, and falls . Referrals and appointments  In addition, I have reviewed and discussed with patient certain preventive protocols, quality metrics, and best practice recommendations. A written personalized care plan for preventive services as well as general preventive health recommendations were provided to patient.     Wynetta Fines, RN  11/30/2017

## 2017-11-30 NOTE — Progress Notes (Signed)
I have reviewed documentation from this visit and I agree with recommendations given.  [name]. [name], MD  [facility]. [place] office.   

## 2017-12-01 LAB — PROSTATE-SPECIFIC AG, SERUM (LABCORP): Prostate Specific Ag, Serum: <0.1 ng/mL (ref 0.0–4.0)

## 2017-12-02 ENCOUNTER — Other Ambulatory Visit: Payer: Self-pay | Admitting: Oncology

## 2017-12-02 ENCOUNTER — Inpatient Hospital Stay: Payer: Medicare Other | Admitting: Oncology

## 2017-12-02 ENCOUNTER — Inpatient Hospital Stay: Payer: Medicare Other

## 2017-12-02 ENCOUNTER — Telehealth: Payer: Self-pay | Admitting: Oncology

## 2017-12-02 VITALS — BP 158/98 | HR 50 | Temp 97.5°F | Resp 18 | Ht 65.0 in | Wt 205.8 lb

## 2017-12-02 DIAGNOSIS — C61 Malignant neoplasm of prostate: Secondary | ICD-10-CM

## 2017-12-02 DIAGNOSIS — E876 Hypokalemia: Secondary | ICD-10-CM

## 2017-12-02 DIAGNOSIS — D751 Secondary polycythemia: Secondary | ICD-10-CM | POA: Diagnosis not present

## 2017-12-02 DIAGNOSIS — I10 Essential (primary) hypertension: Secondary | ICD-10-CM

## 2017-12-02 DIAGNOSIS — Z5111 Encounter for antineoplastic chemotherapy: Secondary | ICD-10-CM | POA: Diagnosis not present

## 2017-12-02 MED ORDER — LEUPROLIDE ACETATE (4 MONTH) 30 MG IM KIT
22.5000 mg | PACK | Freq: Once | INTRAMUSCULAR | Status: AC
  Administered 2017-12-02: 22.5 mg via INTRAMUSCULAR
  Filled 2017-12-02: qty 30

## 2017-12-02 NOTE — Patient Instructions (Signed)
Leuprolide depot injection What is this medicine? LEUPROLIDE (loo PROE lide) is a man-made protein that acts like a natural hormone in the body. It decreases testosterone in men and decreases estrogen in women. In men, this medicine is used to treat advanced prostate cancer. In women, some forms of this medicine may be used to treat endometriosis, uterine fibroids, or other male hormone-related problems. This medicine may be used for other purposes; ask your health care provider or pharmacist if you have questions. COMMON BRAND NAME(S): Eligard, Lupron Depot, Lupron Depot-Ped, Viadur What should I tell my health care provider before I take this medicine? They need to know if you have any of these conditions: -diabetes -heart disease or previous heart attack -high blood pressure -high cholesterol -mental illness -osteoporosis -pain or difficulty passing urine -seizures -spinal cord metastasis -stroke -suicidal thoughts, plans, or attempt; a previous suicide attempt by you or a family member -tobacco smoker -unusual vaginal bleeding (women) -an unusual or allergic reaction to leuprolide, benzyl alcohol, other medicines, foods, dyes, or preservatives -pregnant or trying to get pregnant -breast-feeding How should I use this medicine? This medicine is for injection into a muscle or for injection under the skin. It is given by a health care professional in a hospital or clinic setting. The specific product will determine how it will be given to you. Make sure you understand which product you receive and how often you will receive it. Talk to your pediatrician regarding the use of this medicine in children. Special care may be needed. Overdosage: If you think you have taken too much of this medicine contact a poison control center or emergency room at once. NOTE: This medicine is only for you. Do not share this medicine with others. What if I miss a dose? It is important not to miss a dose.  Call your doctor or health care professional if you are unable to keep an appointment. Depot injections: Depot injections are given either once-monthly, every 12 weeks, every 16 weeks, or every 24 weeks depending on the product you are prescribed. The product you are prescribed will be based on if you are male or male, and your condition. Make sure you understand your product and dosing. What may interact with this medicine? Do not take this medicine with any of the following medications: -chasteberry This medicine may also interact with the following medications: -herbal or dietary supplements, like black cohosh or DHEA -male hormones, like estrogens or progestins and birth control pills, patches, rings, or injections -male hormones, like testosterone This list may not describe all possible interactions. Give your health care provider a list of all the medicines, herbs, non-prescription drugs, or dietary supplements you use. Also tell them if you smoke, drink alcohol, or use illegal drugs. Some items may interact with your medicine. What should I watch for while using this medicine? Visit your doctor or health care professional for regular checks on your progress. During the first weeks of treatment, your symptoms may get worse, but then will improve as you continue your treatment. You may get hot flashes, increased bone pain, increased difficulty passing urine, or an aggravation of nerve symptoms. Discuss these effects with your doctor or health care professional, some of them may improve with continued use of this medicine. Male patients may experience a menstrual cycle or spotting during the first months of therapy with this medicine. If this continues, contact your doctor or health care professional. What side effects may I notice from receiving this medicine? Side   effects that you should report to your doctor or health care professional as soon as possible: -allergic reactions like skin  rash, itching or hives, swelling of the face, lips, or tongue -breathing problems -chest pain -depression or memory disorders -pain in your legs or groin -pain at site where injected or implanted -seizures -severe headache -swelling of the feet and legs -suicidal thoughts or other mood changes -visual changes -vomiting Side effects that usually do not require medical attention (report to your doctor or health care professional if they continue or are bothersome): -breast swelling or tenderness -decrease in sex drive or performance -diarrhea -hot flashes -loss of appetite -muscle, joint, or bone pains -nausea -redness or irritation at site where injected or implanted -skin problems or acne This list may not describe all possible side effects. Call your doctor for medical advice about side effects. You may report side effects to FDA at 1-800-FDA-1088. Where should I keep my medicine? This drug is given in a hospital or clinic and will not be stored at home. NOTE: This sheet is a summary. It may not cover all possible information. If you have questions about this medicine, talk to your doctor, pharmacist, or health care provider.  2018 Elsevier/Gold Standard (2016-02-13 09:45:53)  

## 2017-12-02 NOTE — Progress Notes (Signed)
Hematology and Oncology Follow Up Visit  Tony [name] [id number] 31-Aug-1941 77 y.o. 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  He was initially diagnosed in May 2015 PSA was 15.7, Gleason score is 4+5 = 9.  He developed castration resistant disease in 2017.   Prior Therapy:  He is status post radiation therapy for external beam radiation completed in September 2015 for a total of 45 gray. He also status post seed implant brachytherapy boost completed in October 2015.  He received total of 1 year of androgen deprivation 22,015 and 2016. His PSA nadir was to 0.47 in November 2016. His PSA was 4.08 in May 2017.  Current therapy:  Androgen deprivation in the form of Lupron 22.5 mg resumed on 03/02/2016. This will be given every 3 months.  Zytiga 1000 mg with prednisone started around August 24 of 2017.   Interim History: Mr. [name] is here for a follow-up visit.  He reports no major changes in his health.  He continues to take Zytiga and prednisone without any new complications.  He denies any nausea, excessive fatigue or edema.  His blood pressure has been within normal range when checked by his cardiologist.  He denies any bone pain, back pain or pathological fractures.  He denied any frequency or urination difficulties.  His quality of life and performance status remained excellent.  He does not report any headaches blurred vision or double vision. He does not report any fevers, chills or sweats. He does not report any chest pain shortness of breath cough or hemoptysis. Does not report any palpitation orthopnea or PND.  He does not report any nausea, vomiting, abdominal pain, or satiety or change in his bowel habits. He does not report any musca skeletal complaints. He does not report any skin rashes or lymphadenopathy.  He does not report any petechiae or  ecchymosis.  He does not report any anxiety or depression.  Rest of his review of systems is negative.  Medications: I have reviewed the patient's current medications.  Current Outpatient Medications  Medication Sig Dispense Refill  . amLODipine (NORVASC) 2.5 MG tablet Take 1 tablet (2.5 mg total) by mouth daily. 90 tablet 2  . fluticasone (FLONASE) 50 MCG/ACT nasal spray Place 1 spray into both nostrils daily as needed for allergies or rhinitis.    [name] LORazepam (ATIVAN) 1 MG tablet TAKE 1/2 TO 1 TABLET DAILY AS NEEDED 30 tablet 3  . LORazepam (ATIVAN) 1 MG tablet TAKE 1/2 TO 1 TABLET DAILY AS NEEDED 30 tablet 2  . Multiple Vitamin (MULTIVITAMIN WITH MINERALS) TABS tablet Take 1 tablet by mouth daily.    . naproxen sodium (ANAPROX) 220 MG tablet Take 220 mg by mouth 2 (two) times daily as needed (for soreness).     . predniSONE (DELTASONE) 5 MG tablet Take 1 tablet (5 mg total) by mouth daily with breakfast. 90 tablet 0  . traZODone (DESYREL) 50 MG tablet Take 0.5-1 tablets (25-50 mg total) by mouth at bedtime as needed for sleep. 90 tablet 2  . triamcinolone (KENALOG) 0.025 % ointment Apply 1 application topically 2 (two) times daily. 30 [name] 0  . ZYTIGA 250 MG tablet TAKE 4 TABLETS BY MOUTH DAILY. TAKE ON AN EMPTY STOMACH 1 HOUR BEFORE OR 2 HOURS AFTER A MEAL 120 tablet 0   No current facility-administered medications for this visit.      Allergies:  Allergies  Allergen Reactions  . Penicillins Rash    Past Medical History, Surgical history, Social history, and Family History were reviewed and updated.   Physical Exam: Blood pressure (!) 158/98, pulse (!) 50, temperature (!) 97.5 F (36.4 C), temperature source Oral, resp. rate 18, height 5\' 5"  (1.651 m), weight 205 lb 12.8 oz (93.4 kg), SpO2 97 %.   ECOG: 1 General appearance: Alert, awake gentleman without distress. Head: Atraumatic without abnormalities. Oropharynx: Without any oral thrush or ulcers. Eyes: No scleral  icterus. Heart:regular rate and rhythm, S1, S2 normal, no murmur, click, rub or gallop Lung: Clear to auscultation without any rhonchi, wheezes or dullness to percussion. Abdomin: Soft, nontender without any rebound or guarding. Musculoskeletal: No joint deformity or effusion. Skin: No rashes or lesions. Neurological: No motor, sensory deficits.   Lab Results: Lab Results  Component Value Date   WBC 6.7 11/30/2017   HGB 15.2 11/30/2017   HCT 45.4 11/30/2017   MCV 94.2 11/30/2017   PLT 167 11/30/2017     Chemistry      Component Value Date/Time   NA 141 11/30/2017 0746   NA 141 08/27/2017 0915   K 4.3 11/30/2017 0746   K 4.8 08/27/2017 0915   CL 105 11/30/2017 0746   CO2 27 11/30/2017 0746   CO2 26 08/27/2017 0915   BUN 17 11/30/2017 0746   BUN 16.2 08/27/2017 0915   CREATININE 0.93 11/30/2017 0746   CREATININE 0.9 08/27/2017 0915   GLU 105 01/10/2016      Component Value Date/Time   CALCIUM 9.6 11/30/2017 0746   CALCIUM 9.8 08/27/2017 0915   ALKPHOS 68 11/30/2017 0746   ALKPHOS 83 08/27/2017 0915   AST 21 11/30/2017 0746   AST 23 08/27/2017 0915   ALT 17 11/30/2017 0746   ALT 17 08/27/2017 0915   BILITOT 0.7 11/30/2017 0746   BILITOT 0.95 08/27/2017 0915         Impression and Plan:   77 year old gentleman with the following issues:  1.  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  CT scan and bone scan obtained on 02/26/2016 showed no measurable disease.  He is currently taking Zytiga with prednisone without any major complications.  His PSA continues to be undetectable without any clinical signs or symptoms of disease progression.  The natural course of this disease as well as the implication of long-term Zytiga and prednisone were discussed.  He understands his disease is under excellent control with very limited toxicities at this time.  After discussion today is agreeable to continue.  2. Androgen depravation: Long-term  complications associated with androgen deprivation were reviewed today.  These would include weight gain, metabolic syndrome and osteoporosis.  After discussion today, he is agreeable to continue with the Lupron currently given every 3 months at 22.5 mg.   3. 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  His hemoglobin is back to normal range.  4.Hypokalemia: Related to Zytiga with potassium is normalized at this time..  5. Hypertension: Blood pressure was checked by his cardiologist recently and was within normal range.  His ambulatory monitoring is within normal range.  We will continue to monitor his blood pressure on Zytiga.  6.  Prognosis and goals of care: This was discussed  with the patient today.  Although he has an incurable malignancy, his performance status is excellent and he remains disease-free.  Plan is to continue with aggressive measures at this time.  7. Follow-up: Will be in 3 months.  25  minutes was spent  with the patient face-to-face today.  More than 50% of time was dedicated to patient counseling, education and answering questions about diagnosis, prognosis and future plans of care.    Zola Button, MD 3/21/201910:05 AM

## 2017-12-02 NOTE — Telephone Encounter (Signed)
Scheduled appt per 3/21 los - Gave patient AVS and calender per los.  

## 2017-12-06 ENCOUNTER — Other Ambulatory Visit: Payer: Self-pay | Admitting: Pharmacist

## 2017-12-09 MED FILL — ZYTIGA 250 MG TABLET: 250 | 30 days supply | Qty: 120 | Fill #0

## 2017-12-23 DIAGNOSIS — R5383 Other fatigue: Secondary | ICD-10-CM | POA: Diagnosis not present

## 2017-12-23 DIAGNOSIS — J0100 Acute maxillary sinusitis, unspecified: Secondary | ICD-10-CM | POA: Diagnosis not present

## 2018-01-04 ENCOUNTER — Other Ambulatory Visit: Payer: Self-pay | Admitting: Oncology

## 2018-01-07 MED FILL — ZYTIGA 250 MG TABLET: 250 | 30 days supply | Qty: 120 | Fill #0

## 2018-01-21 ENCOUNTER — Ambulatory Visit (INDEPENDENT_AMBULATORY_CARE_PROVIDER_SITE_OTHER): Payer: Medicare Other | Admitting: Family Medicine

## 2018-01-21 ENCOUNTER — Encounter: Payer: Self-pay | Admitting: Family Medicine

## 2018-01-21 ENCOUNTER — Emergency Department (HOSPITAL_COMMUNITY): Payer: Medicare Other | Admitting: Certified Registered"

## 2018-01-21 ENCOUNTER — Encounter (HOSPITAL_COMMUNITY): Admission: EM | Disposition: A | Discharge status: Home / Self Care | Payer: Self-pay | Source: Home / Self Care

## 2018-01-21 ENCOUNTER — Ambulatory Visit (INDEPENDENT_AMBULATORY_CARE_PROVIDER_SITE_OTHER)
Admission: RE | Admit: 2018-01-21 | Discharge: 2018-01-21 | Disposition: A | Discharge status: Home / Self Care | Payer: Medicare Other | Source: Ambulatory Visit | Attending: Family Medicine | Admitting: Family Medicine

## 2018-01-21 ENCOUNTER — Inpatient Hospital Stay (HOSPITAL_COMMUNITY)
Admission: EM | Admit: 2018-01-21 | Discharge: 2018-01-24 | DRG: 340 | Disposition: A | Discharge status: Home / Self Care | Payer: Medicare Other | Attending: General Surgery | Admitting: General Surgery

## 2018-01-21 ENCOUNTER — Encounter (HOSPITAL_COMMUNITY): Payer: Self-pay | Admitting: Emergency Medicine

## 2018-01-21 VITALS — BP 124/84 | HR 75 | Temp 97.6°F | Resp 16 | Ht 65.0 in | Wt 204.0 lb

## 2018-01-21 DIAGNOSIS — K3580 Unspecified acute appendicitis: Secondary | ICD-10-CM | POA: Diagnosis present

## 2018-01-21 DIAGNOSIS — Z880 Allergy status to penicillin: Secondary | ICD-10-CM | POA: Diagnosis not present

## 2018-01-21 DIAGNOSIS — Z87891 Personal history of nicotine dependence: Secondary | ICD-10-CM

## 2018-01-21 DIAGNOSIS — R1031 Right lower quadrant pain: Secondary | ICD-10-CM

## 2018-01-21 DIAGNOSIS — I714 Abdominal aortic aneurysm, without rupture: Secondary | ICD-10-CM | POA: Diagnosis not present

## 2018-01-21 DIAGNOSIS — Z79899 Other long term (current) drug therapy: Secondary | ICD-10-CM

## 2018-01-21 DIAGNOSIS — K3532 Acute appendicitis with perforation and localized peritonitis, without abscess: Principal | ICD-10-CM | POA: Diagnosis present

## 2018-01-21 DIAGNOSIS — Z923 Personal history of irradiation: Secondary | ICD-10-CM | POA: Diagnosis not present

## 2018-01-21 DIAGNOSIS — Z7952 Long term (current) use of systemic steroids: Secondary | ICD-10-CM | POA: Diagnosis not present

## 2018-01-21 DIAGNOSIS — K352 Acute appendicitis with generalized peritonitis, without abscess: Secondary | ICD-10-CM | POA: Diagnosis not present

## 2018-01-21 DIAGNOSIS — K5900 Constipation, unspecified: Secondary | ICD-10-CM

## 2018-01-21 DIAGNOSIS — Z8042 Family history of malignant neoplasm of prostate: Secondary | ICD-10-CM | POA: Diagnosis not present

## 2018-01-21 DIAGNOSIS — I10 Essential (primary) hypertension: Secondary | ICD-10-CM | POA: Diagnosis not present

## 2018-01-21 DIAGNOSIS — Z7951 Long term (current) use of inhaled steroids: Secondary | ICD-10-CM | POA: Diagnosis not present

## 2018-01-21 DIAGNOSIS — I2510 Atherosclerotic heart disease of native coronary artery without angina pectoris: Secondary | ICD-10-CM | POA: Diagnosis not present

## 2018-01-21 DIAGNOSIS — K37 Unspecified appendicitis: Secondary | ICD-10-CM | POA: Diagnosis not present

## 2018-01-21 DIAGNOSIS — G4700 Insomnia, unspecified: Secondary | ICD-10-CM | POA: Diagnosis not present

## 2018-01-21 DIAGNOSIS — R1032 Left lower quadrant pain: Secondary | ICD-10-CM | POA: Diagnosis not present

## 2018-01-21 DIAGNOSIS — Z8546 Personal history of malignant neoplasm of prostate: Secondary | ICD-10-CM | POA: Diagnosis not present

## 2018-01-21 HISTORY — PX: LAPAROSCOPIC APPENDECTOMY: SHX408

## 2018-01-21 LAB — CBC WITH DIFFERENTIAL/PLATELET
BASOS ABS: 0 10*3/uL (ref 0.0–0.1)
Basophils Relative: 0.4 % (ref 0.0–3.0)
EOS PCT: 0.1 % (ref 0.0–5.0)
Eosinophils Absolute: 0 10*3/uL (ref 0.0–0.7)
HEMATOCRIT: 45.2 % (ref 39.0–52.0)
Hemoglobin: 15.7 g/dL (ref 13.0–17.0)
LYMPHS ABS: 0.6 10*3/uL — AB (ref 0.7–4.0)
LYMPHS PCT: 5.7 % — AB (ref 12.0–46.0)
MCHC: 34.7 g/dL (ref 30.0–36.0)
MCV: 95.3 fl (ref 78.0–100.0)
MONOS PCT: 6 % (ref 3.0–12.0)
Monocytes Absolute: 0.6 10*3/uL (ref 0.1–1.0)
NEUTROS PCT: 87.8 % — AB (ref 43.0–77.0)
Neutro Abs: 8.8 10*3/uL — ABNORMAL HIGH (ref 1.4–7.7)
Platelets: 165 10*3/uL (ref 150.0–400.0)
RBC: 4.75 Mil/uL (ref 4.22–5.81)
RDW: 13 % (ref 11.5–15.5)
WBC: 10.1 10*3/uL (ref 4.0–10.5)

## 2018-01-21 LAB — POCT URINALYSIS DIPSTICK
Glucose, UA: NEGATIVE
LEUKOCYTES UA: NEGATIVE
NITRITE UA: NEGATIVE
SPEC GRAV UA: 1.02 (ref 1.010–1.025)
Urobilinogen, UA: 1 E.U./dL
pH, UA: 6 (ref 5.0–8.0)

## 2018-01-21 LAB — CREATININE, SERUM: CREATININE: 0.95 mg/dL (ref 0.61–1.24)

## 2018-01-21 LAB — BASIC METABOLIC PANEL
BUN: 18 mg/dL (ref 6–23)
CALCIUM: 8.9 mg/dL (ref 8.4–10.5)
CO2: 27 mEq/L (ref 19–32)
Chloride: 100 mEq/L (ref 96–112)
Creatinine, Ser: 0.85 mg/dL (ref 0.40–1.50)
GFR: 93.27 mL/min (ref >60.00)
Glucose, Bld: 123 mg/dL — ABNORMAL HIGH (ref 70–99)
POTASSIUM: 4 meq/L (ref 3.5–5.1)
SODIUM: 136 meq/L (ref 135–145)

## 2018-01-21 LAB — HEPATIC FUNCTION PANEL
ALT: 17 U/L (ref 17–63)
AST: 20 U/L (ref 15–41)
Albumin: 3.7 g/dL (ref 3.5–5.0)
Alkaline Phosphatase: 75 U/L (ref 38–126)
BILIRUBIN DIRECT: 0.3 mg/dL (ref 0.1–0.5)
BILIRUBIN TOTAL: 1.9 mg/dL — AB (ref 0.3–1.2)
Indirect Bilirubin: 1.6 mg/dL — ABNORMAL HIGH (ref 0.3–0.9)
Total Protein: 7.3 g/dL (ref 6.5–8.1)

## 2018-01-21 LAB — CBC
HEMATOCRIT: 46.1 % (ref 39.0–52.0)
Hemoglobin: 15.9 g/dL (ref 13.0–17.0)
MCH: 33.1 pg (ref 26.0–34.0)
MCHC: 34.5 g/dL (ref 30.0–36.0)
MCV: 95.8 fL (ref 78.0–100.0)
Platelets: 162 10*3/uL (ref 150–400)
RBC: 4.81 MIL/uL (ref 4.22–5.81)
RDW: 13.1 % (ref 11.5–15.5)
WBC: 10.5 10*3/uL (ref 4.0–10.5)

## 2018-01-21 LAB — I-STAT CG4 LACTIC ACID, ED: LACTIC ACID, VENOUS: 0.92 mmol/L (ref 0.5–1.9)

## 2018-01-21 LAB — LIPASE, BLOOD: LIPASE: 21 U/L (ref 11–51)

## 2018-01-21 SURGERY — APPENDECTOMY, LAPAROSCOPIC
Anesthesia: General | Site: Abdomen

## 2018-01-21 MED ORDER — TRAZODONE HCL 50 MG PO TABS: 25.0000 mg | ORAL_TABLET | Freq: Every evening | ORAL | Status: DC | PRN

## 2018-01-21 MED ORDER — LIDOCAINE HCL (CARDIAC) PF 100 MG/5ML IV SOSY
PREFILLED_SYRINGE | INTRAVENOUS | Status: DC | PRN
  Administered 2018-01-21: 100 mg via INTRAVENOUS

## 2018-01-21 MED ORDER — ACETAMINOPHEN 325 MG PO TABS: 650.0000 mg | ORAL_TABLET | Freq: Four times a day (QID) | ORAL | Status: DC | PRN

## 2018-01-21 MED ORDER — 0.9 % SODIUM CHLORIDE (POUR BTL) OPTIME
TOPICAL | Status: DC | PRN
  Administered 2018-01-21: 1000 mL

## 2018-01-21 MED ORDER — PROMETHAZINE HCL 25 MG/ML IJ SOLN: 6.2500 mg | INTRAMUSCULAR | Status: DC | PRN

## 2018-01-21 MED ORDER — FLUTICASONE PROPIONATE 50 MCG/ACT NA SUSP
1.0000 | Freq: Every day | NASAL | Status: DC | PRN
  Filled 2018-01-21: qty 16

## 2018-01-21 MED ORDER — PROPOFOL 10 MG/ML IV BOLUS
INTRAVENOUS | Status: DC | PRN
  Administered 2018-01-21: 130 mg via INTRAVENOUS

## 2018-01-21 MED ORDER — BUPIVACAINE-EPINEPHRINE (PF) 0.25% -1:200000 IJ SOLN
INTRAMUSCULAR | Status: AC
  Filled 2018-01-21: qty 30

## 2018-01-21 MED ORDER — DEXAMETHASONE SODIUM PHOSPHATE 10 MG/ML IJ SOLN
INTRAMUSCULAR | Status: DC | PRN
  Administered 2018-01-21: 10 mg via INTRAVENOUS

## 2018-01-21 MED ORDER — LORAZEPAM 0.5 MG PO TABS
0.5000 mg | ORAL_TABLET | Freq: Two times a day (BID) | ORAL | Status: DC
  Administered 2018-01-22 – 2018-01-24 (×5): 0.5 mg via ORAL
  Filled 2018-01-21 (×5): qty 1

## 2018-01-21 MED ORDER — HYDRALAZINE HCL 20 MG/ML IJ SOLN: 10.0000 mg | INTRAMUSCULAR | Status: DC | PRN

## 2018-01-21 MED ORDER — ABIRATERONE ACETATE 250 MG PO TABS
1000.0000 mg | ORAL_TABLET | Freq: Every day | ORAL | Status: DC
  Administered 2018-01-22 – 2018-01-24 (×3): 1000 mg via ORAL
  Filled 2018-01-21 (×3): qty 4

## 2018-01-21 MED ORDER — AMLODIPINE BESYLATE 2.5 MG PO TABS
2.5000 mg | ORAL_TABLET | Freq: Every day | ORAL | Status: DC
  Administered 2018-01-22 – 2018-01-24 (×3): 2.5 mg via ORAL
  Filled 2018-01-21 (×3): qty 1

## 2018-01-21 MED ORDER — DIPHENHYDRAMINE HCL 12.5 MG/5ML PO ELIX: 12.5000 mg | ORAL_SOLUTION | Freq: Four times a day (QID) | ORAL | Status: DC | PRN

## 2018-01-21 MED ORDER — GENTAMICIN SULFATE 40 MG/ML IJ SOLN
560.0000 mg | INTRAVENOUS | Status: DC
  Administered 2018-01-21: 560 mg via INTRAVENOUS
  Filled 2018-01-21: qty 14

## 2018-01-21 MED ORDER — STERILE WATER FOR INJECTION IJ SOLN
INTRAMUSCULAR | Status: DC | PRN
  Administered 2018-01-21: 100 mL

## 2018-01-21 MED ORDER — BUPIVACAINE-EPINEPHRINE 0.25% -1:200000 IJ SOLN
INTRAMUSCULAR | Status: DC | PRN
  Administered 2018-01-21: 20 mL

## 2018-01-21 MED ORDER — MORPHINE SULFATE (PF) 4 MG/ML IV SOLN: 2.0000 mg | INTRAVENOUS | Status: DC | PRN

## 2018-01-21 MED ORDER — SUGAMMADEX SODIUM 200 MG/2ML IV SOLN
INTRAVENOUS | Status: DC | PRN
  Administered 2018-01-21: 200 mg via INTRAVENOUS

## 2018-01-21 MED ORDER — ONDANSETRON HCL 4 MG/2ML IJ SOLN
INTRAMUSCULAR | Status: DC | PRN
  Administered 2018-01-21: 4 mg via INTRAVENOUS

## 2018-01-21 MED ORDER — ENOXAPARIN SODIUM 40 MG/0.4ML ~~LOC~~ SOLN
40.0000 mg | SUBCUTANEOUS | Status: DC
  Administered 2018-01-22 – 2018-01-24 (×3): 40 mg via SUBCUTANEOUS
  Filled 2018-01-21 (×3): qty 0.4

## 2018-01-21 MED ORDER — SODIUM CHLORIDE 0.45 % IV SOLN
INTRAVENOUS | Status: DC
  Filled 2018-01-21 (×8): qty 1000

## 2018-01-21 MED ORDER — FENTANYL CITRATE (PF) 100 MCG/2ML IJ SOLN: 25.0000 ug | INTRAMUSCULAR | Status: DC | PRN

## 2018-01-21 MED ORDER — CHLORHEXIDINE GLUCONATE CLOTH 2 % EX PADS: 6.0000 | MEDICATED_PAD | Freq: Once | CUTANEOUS | Status: DC

## 2018-01-21 MED ORDER — CIPROFLOXACIN IN D5W 400 MG/200ML IV SOLN
400.0000 mg | Freq: Two times a day (BID) | INTRAVENOUS | Status: DC
  Administered 2018-01-21 – 2018-01-23 (×5): 400 mg via INTRAVENOUS
  Filled 2018-01-21 (×7): qty 200

## 2018-01-21 MED ORDER — ROCURONIUM BROMIDE 100 MG/10ML IV SOLN
INTRAVENOUS | Status: DC | PRN
  Administered 2018-01-21: 50 mg via INTRAVENOUS

## 2018-01-21 MED ORDER — LACTATED RINGERS IV SOLN
INTRAVENOUS | Status: DC | PRN
  Administered 2018-01-21 (×2): via INTRAVENOUS

## 2018-01-21 MED ORDER — SODIUM CHLORIDE 0.9 % IR SOLN
Status: DC | PRN
  Administered 2018-01-21: 1000 mL

## 2018-01-21 MED ORDER — METRONIDAZOLE IN NACL 5-0.79 MG/ML-% IV SOLN
500.0000 mg | Freq: Once | INTRAVENOUS | Status: AC
  Administered 2018-01-21: 500 mg via INTRAVENOUS
  Filled 2018-01-21: qty 100

## 2018-01-21 MED ORDER — SODIUM CHLORIDE 0.9 % IV SOLN
INTRAVENOUS | Status: DC
  Administered 2018-01-21 – 2018-01-22 (×2): via INTRAVENOUS

## 2018-01-21 MED ORDER — CIPROFLOXACIN IN D5W 400 MG/200ML IV SOLN
400.0000 mg | Freq: Once | INTRAVENOUS | Status: AC
  Administered 2018-01-21: 400 mg via INTRAVENOUS
  Filled 2018-01-21: qty 200

## 2018-01-21 MED ORDER — ALUM & MAG HYDROXIDE-SIMETH 200-200-20 MG/5ML PO SUSP
30.0000 mL | Freq: Four times a day (QID) | ORAL | Status: DC | PRN
  Administered 2018-01-22: 30 mL via ORAL
  Filled 2018-01-21: qty 30

## 2018-01-21 MED ORDER — PREDNISONE 5 MG PO TABS
5.0000 mg | ORAL_TABLET | Freq: Every day | ORAL | Status: DC
  Administered 2018-01-22 – 2018-01-24 (×3): 5 mg via ORAL
  Filled 2018-01-21 (×3): qty 1

## 2018-01-21 MED ORDER — FENTANYL CITRATE (PF) 100 MCG/2ML IJ SOLN
INTRAMUSCULAR | Status: DC | PRN
  Administered 2018-01-21: 100 ug via INTRAVENOUS
  Administered 2018-01-21: 50 ug via INTRAVENOUS
  Administered 2018-01-21: 100 ug via INTRAVENOUS

## 2018-01-21 MED ORDER — ONDANSETRON HCL 4 MG/2ML IJ SOLN: 4.0000 mg | Freq: Four times a day (QID) | INTRAMUSCULAR | Status: DC | PRN

## 2018-01-21 MED ORDER — SUCCINYLCHOLINE CHLORIDE 20 MG/ML IJ SOLN
INTRAMUSCULAR | Status: DC | PRN
  Administered 2018-01-21: 100 mg via INTRAVENOUS

## 2018-01-21 MED ORDER — MEPERIDINE HCL 50 MG/ML IJ SOLN: 6.2500 mg | INTRAMUSCULAR | Status: DC | PRN

## 2018-01-21 MED ORDER — DIPHENHYDRAMINE HCL 50 MG/ML IJ SOLN: 12.5000 mg | Freq: Four times a day (QID) | INTRAMUSCULAR | Status: DC | PRN

## 2018-01-21 MED ORDER — TRAMADOL HCL 50 MG PO TABS: 50.0000 mg | ORAL_TABLET | Freq: Four times a day (QID) | ORAL | Status: DC | PRN

## 2018-01-21 MED ORDER — ACETAMINOPHEN 650 MG RE SUPP: 650.0000 mg | Freq: Four times a day (QID) | RECTAL | Status: DC | PRN

## 2018-01-21 MED ORDER — METRONIDAZOLE IN NACL 5-0.79 MG/ML-% IV SOLN
500.0000 mg | Freq: Three times a day (TID) | INTRAVENOUS | Status: DC
  Administered 2018-01-21 – 2018-01-24 (×8): 500 mg via INTRAVENOUS
  Filled 2018-01-21 (×10): qty 100

## 2018-01-21 MED ORDER — ONDANSETRON 4 MG PO TBDP: 4.0000 mg | ORAL_TABLET | Freq: Four times a day (QID) | ORAL | Status: DC | PRN

## 2018-01-21 MED ORDER — OXYCODONE HCL 5 MG PO TABS: 5.0000 mg | ORAL_TABLET | ORAL | Status: DC | PRN

## 2018-01-21 MED ORDER — IOPAMIDOL (ISOVUE-300) INJECTION 61%
100.0000 mL | Freq: Once | INTRAVENOUS | Status: AC | PRN
  Administered 2018-01-21: 100 mL via INTRAVENOUS

## 2018-01-21 SURGICAL SUPPLY — 37 items
APPLIER CLIP 5 13 M/L LIGAMAX5 (MISCELLANEOUS)
CANISTER SUCT 3000ML PPV (MISCELLANEOUS) ×3 IMPLANT
CHLORAPREP W/TINT 26ML (MISCELLANEOUS) ×3 IMPLANT
CLIP APPLIE 5 13 M/L LIGAMAX5 (MISCELLANEOUS) IMPLANT
CLIP VESOLOCK XL 6/CT (CLIP) ×3 IMPLANT
COVER SURGICAL LIGHT HANDLE (MISCELLANEOUS) ×3 IMPLANT
DERMABOND ADVANCED (GAUZE/BANDAGES/DRESSINGS) ×2
DERMABOND ADVANCED .7 DNX12 (GAUZE/BANDAGES/DRESSINGS) ×1 IMPLANT
DEVICE PMI PUNCTURE CLOSURE (MISCELLANEOUS) IMPLANT
ELECT REM PT RETURN 9FT ADLT (ELECTROSURGICAL) ×3
ELECTRODE REM PT RTRN 9FT ADLT (ELECTROSURGICAL) ×1 IMPLANT
ENDOLOOP SUT PDS II  0 18 (SUTURE)
ENDOLOOP SUT PDS II 0 18 (SUTURE) IMPLANT
GLOVE BIOGEL PI IND STRL 7.0 (GLOVE) ×1 IMPLANT
GLOVE BIOGEL PI INDICATOR 7.0 (GLOVE) ×2
GLOVE SURG SS PI 7.0 STRL IVOR (GLOVE) ×3 IMPLANT
GOWN STRL REUS W/ TWL LRG LVL3 (GOWN DISPOSABLE) ×3 IMPLANT
GOWN STRL REUS W/TWL LRG LVL3 (GOWN DISPOSABLE) ×6
KIT BASIN OR (CUSTOM PROCEDURE TRAY) ×3 IMPLANT
KIT TURNOVER KIT B (KITS) ×3 IMPLANT
NEEDLE 22X1 1/2 (OR ONLY) (NEEDLE) ×3 IMPLANT
NS IRRIG 1000ML POUR BTL (IV SOLUTION) ×3 IMPLANT
PAD ARMBOARD 7.5X6 YLW CONV (MISCELLANEOUS) ×6 IMPLANT
POUCH RETRIEVAL ECOSAC 10 (ENDOMECHANICALS) ×1 IMPLANT
POUCH RETRIEVAL ECOSAC 10MM (ENDOMECHANICALS) ×2
SCISSORS LAP 5X35 DISP (ENDOMECHANICALS) ×3 IMPLANT
SET IRRIG TUBING LAPAROSCOPIC (IRRIGATION / IRRIGATOR) ×3 IMPLANT
SPECIMEN JAR SMALL (MISCELLANEOUS) ×3 IMPLANT
SUT MNCRL AB 4-0 PS2 18 (SUTURE) ×3 IMPLANT
TOWEL OR 17X24 6PK STRL BLUE (TOWEL DISPOSABLE) ×3 IMPLANT
TOWEL OR 17X26 10 PK STRL BLUE (TOWEL DISPOSABLE) ×3 IMPLANT
TRAY FOLEY CATH SILVER 16FR (SET/KITS/TRAYS/PACK) IMPLANT
TRAY LAPAROSCOPIC MC (CUSTOM PROCEDURE TRAY) ×3 IMPLANT
TROCAR XCEL NON-BLD 11X100MML (ENDOMECHANICALS) ×3 IMPLANT
TROCAR XCEL NON-BLD 5MMX100MML (ENDOMECHANICALS) ×6 IMPLANT
TUBING INSUFFLATION (TUBING) ×3 IMPLANT
WATER STERILE IRR 1000ML POUR (IV SOLUTION) ×3 IMPLANT

## 2018-01-21 NOTE — Transfer of Care (Signed)
Immediate Anesthesia Transfer of Care Note  Patient: Tony [name] [name]  Procedure(s) Performed: APPENDECTOMY LAPAROSCOPIC (N/A Abdomen)  Patient Location: PACU  Anesthesia Type:General  Level of Consciousness: oriented, drowsy, patient cooperative and responds to stimulation  Airway & Oxygen Therapy: Patient Spontanous Breathing and Patient connected to nasal cannula oxygen  Post-op Assessment: Report given to RN, Post -op Vital signs reviewed and stable and Patient moving all extremities X 4  Post vital signs: Reviewed and stable  Last Vitals:  Vitals Value Taken Time  BP 164/91 01/21/2018  7:09 PM  Temp 36.5 C 01/21/2018  7:08 PM  Pulse 77 01/21/2018  7:14 PM  Resp 16 01/21/2018  7:14 PM  SpO2 96 % 01/21/2018  7:14 PM  Vitals shown include unvalidated device data.  Last Pain:  Vitals:   01/21/18 1908  TempSrc:   PainSc: 0-No pain         Complications: No apparent anesthesia complications

## 2018-01-21 NOTE — H&P (Signed)
Tony [name] July 22, 1941  [id number].    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  He began having some lower abdominal pain on Wednesday night.  He states that his pain localized to his RLQ yesterday.  He denies any other symptoms such as N/V/D, fevers, chills, etc.  He denies any chest pain or SOB.  He saw his PCP today and was sent for a CT scan.  This revealed acute appendicitis.  He was sent to the ED where we have been asked to see him.  ROS: ROS: Please see HPI, otherwise all other systems are negative.  He does walk with a cane.  Family History  Problem Relation Age of Onset  . Alzheimer's disease Mother   . Alzheimer's disease Father   . Cancer Father        prostate  . Arthritis Sister   . Cancer Brother        prostate    Past Medical History:  Diagnosis Date  . Hypertension    no meds  . Prostate cancer ([place]) 02/08/14   Gleason 4+5=9, PSA 15.65  . Radiation   . Sinus problem     Past Surgical History:  Procedure Laterality Date  . PROSTATE BIOPSY  02/08/14   gleason 4+5=9, 12/12 cores positive, 54 gm  . RADIOACTIVE SEED IMPLANT N/A 06/29/2014   Procedure: RADIOACTIVE SEED IMPLANT;  Surgeon: Bernestine Amass, MD;  Location: [facility];  Service: Urology;  Laterality: N/A;    Social History:  reports that he quit smoking about 5 years ago. His smoking use included cigars. He quit after 60.00 years of use. He has never used smokeless tobacco. He reports that he drinks alcohol. He reports that he does not use drugs.  Allergies:  Allergies  Allergen Reactions  . Penicillins Rash     (Not in a hospital admission)   Physical Exam: Blood pressure (!) 141/99, pulse 92, temperature 98.2 F (36.8 C), temperature source Oral, resp. rate 16,  height 5\' 9"  (1.753 m), weight 92.5 kg (204 lb), SpO2 97 %. General: pleasant, WD, WN white male who is laying in bed in NAD HEENT: head is normocephalic, atraumatic.  Sclera are noninjected.  PERRL.  Ears and nose without any masses or lesions.  Mouth is pink and moist Heart: regular, rate, and rhythm.  Normal s1,s2. No obvious murmurs, gallops, or rubs noted.  Palpable radial and pedal pulses bilaterally Lungs: CTAB, no wheezes, rhonchi, or rales noted.  Respiratory effort nonlabored Abd: soft, tender in RLQ, but no guarding or peritoneal signs, ND, +BS, no masses or organomegaly.  Small fat containing reducible umbilical hernia MS: all 4 extremities are symmetrical with no cyanosis, clubbing.  Trace bilateral LE edema Skin: warm and dry with no masses, lesions, or rashes.  He does have a small abrasion to his right forearm from hitting a tree. Psych: A&Ox3 with an appropriate affect.   Results for orders placed or performed during the hospital encounter of 01/21/18 (from the past 48 hour(s))  Hepatic function panel     Status: Abnormal   Collection Time: 01/21/18  3:00 PM  Result Value Ref Range   Total Protein 7.3 6.5 - 8.1 g/dL   Albumin 3.7 3.5 - 5.0 g/dL  AST 20 15 - 41 U/L   ALT 17 17 - 63 U/L   Alkaline Phosphatase 75 38 - 126 U/L   Total Bilirubin 1.9 (H) 0.3 - 1.2 mg/dL   Bilirubin, Direct 0.3 0.1 - 0.5 mg/dL   Indirect Bilirubin 1.6 (H) 0.3 - 0.9 mg/dL    Comment: Performed at [facility] [address], [place], [place] [address]  Lipase, blood     Status: None   Collection Time: 01/21/18  3:00 PM  Result Value Ref Range   Lipase 21 11 - 51 U/L    Comment: Performed at [facility] [address], [place], [place] [address]  I-Stat CG4 Lactic Acid, ED     Status: None   Collection Time: 01/21/18  3:26 PM  Result Value Ref Range   Lactic Acid, Venous 0.92 0.5 - 1.9 mmol/L   Ct Abdomen Pelvis W Contrast  Result Date: 01/21/2018 CLINICAL DATA:   77 year old male with acute RIGHT abdominal and pelvic pain for 3 days. History of prostate cancer. EXAM: CT ABDOMEN AND PELVIS WITH CONTRAST TECHNIQUE: Multidetector CT imaging of the abdomen and pelvis was performed using the standard protocol following bolus administration of intravenous contrast. CONTRAST:  183mL ISOVUE-300 IOPAMIDOL (ISOVUE-300) INJECTION 61% COMPARISON:  02/26/2016 and 02/22/2014 CTs. FINDINGS: Lower chest: No acute abnormality. Cardiomegaly and coronary artery and thoracic aortic atherosclerotic calcifications noted. Hepatobiliary: Probable small RIGHT hepatic cysts again noted. No new or suspicious hepatic abnormalities noted. The gallbladder is unremarkable. No biliary dilatation. Pancreas: Atrophic but otherwise unremarkable. Spleen: Unremarkable Adrenals/Urinary Tract: Mid RIGHT renal scarring and LEFT LOWER pole renal cyst again noted. There is no evidence of hydronephrosis. The adrenal glands and bladder are unremarkable. Stomach/Bowel: An enlarged inflamed appendix with moderate adjacent inflammation is identified and compatible with acute appendicitis. No abscess, pneumoperitoneum or bowel obstruction. No other bowel abnormalities identified. 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 Aortic atherosclerotic calcifications again noted. No enlarged lymph nodes identified. Reproductive: Prostate seeds are present. Other: No ascites identified. A moderate RIGHT inguinal hernia containing fat is unchanged. Musculoskeletal: No acute abnormalities. No suspicious focal bony lesions are present. 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 IMPRESSION: 1. Acute appendicitis with moderate periappendiceal inflammation. No evidence of free fluid, abscess or pneumoperitoneum. 2. Unchanged 3.1 cm abdominal aortic aneurysm. Recommend followup by ultrasound in 3 years. This recommendation follows ACR consensus guidelines:  White Paper of the ACR Incidental Findings Committee II on Vascular Findings. J Am Coll Radiol 2013; 37:902-409 3. Cardiomegaly and coronary artery disease. 4. Unchanged moderate RIGHT inguinal hernia containing fat. Critical Value/emergent results were called by telephone at the time of interpretation on 01/21/2018 at 1:42 pm to Dr. [name] , who verbally acknowledged these results. Electronically Signed   By: Margarette Canada M.D.   On: 01/21/2018 13:42      Assessment/Plan Acute appendicitis Plan for OR tonight for lap appy Admit for observation Cipro/flagyl (PCN allergy) I have discussed the procedure and risks of appendectomy. The risks include but are not limited to bleeding, infection, wound problems, anesthesia, injury to intra-abdominal organs, possibility of postoperative ileus. He seems to understand and agrees with the plan.  He is aware that because he is on prednisone that he is at slightly higher risk for staple line leak or breakdown.  Prostate cancer Stable, managed by Dr. [name]  HTN Resume home meds when stable   Henreitta Cea, [facility] 01/21/2018, 4:41  PM Pager: [phone]

## 2018-01-21 NOTE — ED Provider Notes (Signed)
Medical screening examination/treatment/procedure(s) were conducted as a shared visit with non-physician practitioner(s) and myself.  I personally evaluated the patient during the encounter. Briefly, the patient is a 77 y.o. male here with RLQ abd pain, found to have acute appendicitis. Empiric Abx given. Surgery consulted for further management.      Fatima Blank, MD 01/22/18 [phone]

## 2018-01-21 NOTE — ED Notes (Signed)
Informed consent signed and at bedside. Patient denies further questions regarding procedure.

## 2018-01-21 NOTE — ED Provider Notes (Signed)
Patient placed in Quick Look pathway, seen and evaluated   Chief Complaint: Appendicitis  HPI:   Patient presents today for evaluation of appendicitis.  For the past 2 days he has had abdominal pain, it started in the left lower quadrant however has settled in the right lower quadrant.  He went to his PCP this morning who ordered a CT abdomen pelvis, along with a set of basic blood work that is viewable and results review and chart review.  CT from this morning is consistent with acute appendicitis.  Patient is allergic to penicillins, states he gets a rash.  ROS: No fevers or chills, no nausea vomiting or diarrhea.  (one)  Physical Exam:   Gen: No distress  Neuro: Awake and Alert  Skin: Warm    Focused Exam: Patient has tenderness across right lower quadrant.  Abdomen generally is distended.  No rebound tenderness or tenderness in other areas.  No guarding.   Initiation of care has begun. The patient has been counseled on the process, plan, and necessity for staying for the completion/evaluation, and the remainder of the medical screening examination.  IV antibiotics, labs ordered.    Lorin Glass, PA-C 01/21/18 1459    Noemi Chapel, MD 01/22/18 [phone]

## 2018-01-21 NOTE — Anesthesia Procedure Notes (Signed)
Procedure Name: Intubation Date/Time: 01/21/2018 6:00 PM Performed by: Lance Coon, CRNA Pre-anesthesia Checklist: Patient identified, Emergency Drugs available, Suction available, Patient being monitored and Timeout performed Patient Re-evaluated:Patient Re-evaluated prior to induction Oxygen Delivery Method: Circle system utilized Preoxygenation: Pre-oxygenation with 100% oxygen Induction Type: IV induction and Rapid sequence Laryngoscope Size: Miller and 3 Grade View: Grade I Tube type: Oral Tube size: 7.5 mm Number of attempts: 1 Airway Equipment and Method: Stylet Placement Confirmation: ETT inserted through vocal cords under direct vision,  positive ETCO2 and breath sounds checked- equal and bilateral Secured at: 22 cm Tube secured with: Tape Dental Injury: Teeth and Oropharynx as per pre-operative assessment

## 2018-01-21 NOTE — Progress Notes (Signed)
ACUTE VISIT   HPI:  Chief Complaint  Patient presents with  . Abdominal Pain     2 days across the bottom    Tony [name] is a 77 y.o. male, who is here today complaining of 2 days of lower abdominal pain. Sudden onset.  Lower abdominal pain is achy, constant, 6/10, it is slightly better today. Initially pain involved all lower abdomen but now he has concentrated in RLQ. Pain is not radiated. 6-7/10.  He has taken Aleve. No associated fever, chills, nausea, vomiting, or changes in urine. History of prostate cancer, he follows with urologist. No changes in urinary frequency, he has not noted dysuria or gross hematuria. He mentions that his urine is darker than usual.  No MS changes or changes in appetite. Last bowel movement 5 days ago, wife thinks 3 days ago. Passing gas.  Exacerbated by movement,when trying to get up form bed. No alleviating factors identified.   He has not had similar pain in the past. No history of trauma but he did some yard work a few days ago,more than his usual.   Review of Systems  Constitutional: Positive for activity change and fatigue (No more than usual.). Negative for appetite change and fever.  HENT: Negative for mouth sores, sore throat and trouble swallowing.   Respiratory: Negative for cough, shortness of breath and wheezing.   Cardiovascular: Negative for palpitations and leg swelling.  Gastrointestinal: Positive for abdominal pain and constipation. Negative for blood in stool, nausea and vomiting.  Genitourinary: Negative for decreased urine volume, dysuria and hematuria.  Musculoskeletal: Positive for gait problem. Negative for back pain and myalgias.  Skin: Negative for pallor and rash.  Neurological: Negative for syncope and weakness.  Psychiatric/Behavioral: Negative for confusion. The patient is nervous/anxious.       Current Outpatient Medications on File Prior to Visit  Medication Sig Dispense Refill    . amLODipine (NORVASC) 2.5 MG tablet Take 1 tablet (2.5 mg total) by mouth daily. 90 tablet 2  . fluticasone (FLONASE) 50 MCG/ACT nasal spray Place 1 spray into both nostrils daily as needed for allergies or rhinitis.    [name] LORazepam (ATIVAN) 1 MG tablet TAKE 1/2 TO 1 TABLET DAILY AS NEEDED 30 tablet 3  . LORazepam (ATIVAN) 1 MG tablet TAKE 1/2 TO 1 TABLET DAILY AS NEEDED 30 tablet 2  . Multiple Vitamin (MULTIVITAMIN WITH MINERALS) TABS tablet Take 1 tablet by mouth daily.    . naproxen sodium (ANAPROX) 220 MG tablet Take 220 mg by mouth 2 (two) times daily as needed (for soreness).     . predniSONE (DELTASONE) 5 MG tablet Take 1 tablet (5 mg total) by mouth daily with breakfast. 90 tablet 0  . traZODone (DESYREL) 50 MG tablet Take 0.5-1 tablets (25-50 mg total) by mouth at bedtime as needed for sleep. 90 tablet 2  . triamcinolone (KENALOG) 0.025 % ointment Apply 1 application topically 2 (two) times daily. 30 g 0  . ZYTIGA 250 MG tablet TAKE 4 TABLETS (1000 MG) BY MOUTH DAILY. TAKE ON AN EMPTY STOMACH 1 HOUR BEFORE OR 2 HOURS AFTER A MEAL 120 tablet 0   No current facility-administered medications on file prior to visit.      Past Medical History:  Diagnosis Date  . Hypertension    no meds  . Prostate cancer ([place]) 02/08/14   Gleason 4+5=9, PSA 15.65  . Radiation   . Sinus problem    Allergies  Allergen Reactions  .  Penicillins Rash    Social History   Socioeconomic History  . Marital status: Married    Spouse name: Not on file  . Number of children: Not on file  . Years of education: Not on file  . Highest education level: Not on file  Occupational History  . Not on file  Social Needs  . Financial resource strain: Not on file  . Food insecurity:    Worry: Not on file    Inability: Not on file  . Transportation needs:    Medical: Not on file    Non-medical: Not on file  Tobacco Use  . Smoking status: Former Smoker    Years: 60.00    Types: Cigars    Last attempt to  quit: 09/14/2012    Years since quitting: 5.3  . Smokeless tobacco: Never Used  . Tobacco comment: little cigars; the small ones; quit smoking 77 yo   Substance and Sexual Activity  . Alcohol use: Yes    Comment: 1 beer daily  . Drug use: No  . Sexual activity: Not Currently  Lifestyle  . Physical activity:    Days per week: Not on file    Minutes per session: Not on file  . Stress: Not on file  Relationships  . Social connections:    Talks on phone: Not on file    Gets together: Not on file    Attends religious service: Not on file    Active member of club or organization: Not on file    Attends meetings of clubs or organizations: Not on file    Relationship status: Not on file  Other Topics Concern  . Not on file  Social History Narrative  . Not on file    Vitals:   01/21/18 0905  BP: 124/84  Pulse: 75  Resp: 16  Temp: 97.6 F (36.4 C)  SpO2: 96%   Body mass index is 33.95 kg/m.   Physical Exam  Nursing note and vitals reviewed. Constitutional: He is oriented to person, place, and time. He appears well-developed. He appears distressed (Mild due to pain).  HENT:  Head: Normocephalic and atraumatic.  Mouth/Throat: Oropharynx is clear and moist and mucous membranes are normal.  Eyes: Conjunctivae are normal.  Cardiovascular: Normal rate and regular rhythm.  No murmur heard. Respiratory: Effort normal and breath sounds normal. No respiratory distress.  GI: Soft. He exhibits no mass. There is tenderness in the right lower quadrant. There is guarding and tenderness at McBurney's point. There is no rigidity and no rebound.  Musculoskeletal: He exhibits edema (Trace pitting LE edema,bilateral).  Lymphadenopathy:    He has no cervical adenopathy.  Neurological: He is alert and oriented to person, place, and time. He has normal strength.  Unstable gait assisted by a cane.  Skin: Skin is warm. No rash noted. No erythema.  Psychiatric: He has a normal mood and affect.    Well groomed,good eye contact.    ASSESSMENT AND PLAN:  [name] was seen today for abdominal pain.  Diagnoses and all orders for this visit:  Right lower quadrant abdominal pain   We discussed possible etiologies, given the localization of the pain and based on examination today I think abdominal/pelvic CT is warranted today to evaluate for appendicitis.       ?  Colitis, muscle strain among some to also consider if abdominal CT is negative. Further recommendation will be given according to imaging results.  -     POC Urinalysis Dipstick -  CT Abdomen Pelvis W Contrast; Future -     Basic metabolic panel -     CBC with Differential/Platelet  Constipation, unspecified constipation type  Adequate hydration. Recommend increasing fiber intake. Prune juice may help. Instructed about warning signs.   Addendum: Received a call from radiologist, Dr. Melanee Spry, with verbal report of abdominal/pelvic CT done today: Appendicitis. I called CT department, I spoke with Ms. Berneda Rose to discuss imaging findings. She was instructed to take Mr. Slyter to the ER right now.They are accross the street from [place].      Shametra Cumberland G. [name], MD  [facility]. [place] office.

## 2018-01-21 NOTE — Patient Instructions (Signed)
A few things to remember from today's visit:   Right lower quadrant abdominal pain - Plan: POC Urinalysis Dipstick  Constipation, unspecified constipation type   Please be sure medication list is accurate. If a new problem present, please set up appointment sooner than planned today.

## 2018-01-21 NOTE — ED Notes (Signed)
Unable to obtain temperature due to pt actively vomiting.

## 2018-01-21 NOTE — Op Note (Signed)
Preoperative diagnosis: acute appendicitis with perforation  Postoperative diagnosis: Same   Procedure: laparoscopic appendectomy  Surgeon: Gurney Maxin, M.D.  Asst: none  Anesthesia: Gen.   Indications for procedure: Tony [name] is a 77 y.o. male with symptoms of pain in right lower quadrant, nausea and vomiting consistent with acute appendicitis. Confirmed by CT scan and laboratory values.  Description of procedure: The patient was brought into the operative suite, placed supine. Anesthesia was administered with endotracheal tube. The patient's left arm was tucked. All pressure points were offloaded by foam padding. The patient was prepped and draped in the usual sterile fashion.  A transverse incision was made to the left of the umbilicus and a 66mm trocar was [place]. Pneumoperitoneum was applied with high flow low pressure.  2 74mm trocars were placed, one in the suprapubic space, one in the LLQ, the periumbilical incision was then up-sized and a 66mm trocar placed in that space. All trocars sites were first anesthesized with Marcaine. Next the patient was placed in trendelenberg, rotated to the left. The omentum was retracted cephalad. The cecum and appendix were identified. The appendiceal base was . The base of the appendix was dissected and a window through the mesoappendix was created with blunt dissection. Large Hem-o-lock clips were used to doubly ligate the base of the appendix and mesoappendix. The appendix was cut free with scissors.  The appendix was placed in a specimen bag. The pelvis and RLQ were irrigated. The appendix was removed via the umbilicus. 0 vicryl was used to close the fascial defect. Pneumoperitoneum was removed, all trocars were removed. All incisions were closed with 4-0 monocryl subcuticular stitch. The patient woke from anesthesia and was brought to PACU in stable condition.  Findings: perforated appendicitis  Specimen: appendix  Blood loss: 25  ml  Local anesthesia: 20 ml marcaine  Complications: none  Images:     Gurney Maxin, M.D. General, Bariatric, & Minimally Invasive Surgery [facility]

## 2018-01-21 NOTE — Progress Notes (Signed)
Pharmacy Antibiotic Note  Tony [name] is a 77 y.o. male admitted on 01/21/2018 with acute appendicitis, now s/p appendectomy.  Pharmacy has been consulted for gentamicin dosing for intra-abdominal infection.  Renal function stable, afebrile, WBC WNL, LA 0.92.   Plan: Gentamicin 560mg  IV Q24H (7 mg/kg AdjBW for BMI = 30) Cipro 400mg  IV Q12H and Flagyl 500mg  IV Q8H per MD Monitor renal fxn, clinical progress, abx LOT - check gent 10-hr level if on therapy >/= 3 days   Height: 5\' 9"  (175.3 cm) Weight: 204 lb (92.5 kg) IBW/kg (Calculated) : 70.7  Temp (24hrs), Avg:97.8 F (36.6 C), Min:97.6 F (36.4 C), Max:98.2 F (36.8 C)  Recent Labs  Lab 01/21/18 1012 01/21/18 1526  WBC 10.1  --   CREATININE 0.85  --   LATICACIDVEN  --  0.92    Estimated Creatinine Clearance: 84.3 mL/min (by C-G formula based on SCr of 0.85 mg/dL).    Allergies  Allergen Reactions  . Penicillins Rash    Has patient had a PCN reaction causing immediate rash, facial/tongue/throat swelling, SOB or lightheadedness with hypotension: Yes Has patient had a PCN reaction causing severe rash involving mucus membranes or skin necrosis: No Has patient had a PCN reaction that required hospitalization: No Has patient had a PCN reaction occurring within the last 10 years: No If all of the above answers are "NO", then may proceed with Cephalosporin use.    Gent 5/10 >> Cipro 5/10 >> Flagyl 5/10 >>  5/10 BCx -   Ben Sanz D. Mina Marble, PharmD, BCPS, [place] Pager:  [phone] 01/21/2018, 8:23 PM

## 2018-01-21 NOTE — ED Provider Notes (Signed)
[facility] EMERGENCY DEPARTMENT Provider Note   CSN: [id number] Arrival date & time: 01/21/18  1406     History   Chief Complaint Chief Complaint  Patient presents with  . Appendicitis    HPI Tony [name] is a 77 y.o. male.  HPI 77 year old male past medical history significant for hypertension, prostate cancer that presents to the emergency department today for evaluation of 2 days of right lower quadrant abdominal pain.  Patient states the pain initially started in his left lower quadrant and radiated to his right lower quadrant.  Denies any associated nausea or vomiting.  Last bowel movement was 2 days ago.  Denies any urinary symptoms.  Went to see his PCP this morning who ordered outpatient CT scan and basic lab work.  Patient was sent here due to acute appendicitis.  Patient denies any testicular pain or swelling.  Denies any associated fevers.  Patient is not currently in pain unless he starts moving.  Does not want any pain medications.  Denies any other abdominal surgeries.  Pt denies any fever, chill, ha, vision changes, lightheadedness, dizziness, congestion, neck pain, cp, sob, cough, n/v/d, urinary symptoms, change in bowel habits, melena, hematochezia, lower extremity paresthesias.   Past Medical History:  Diagnosis Date  . Hypertension    no meds  . 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 Radiation   . Sinus problem     Patient Active Problem List   Diagnosis Date Noted  . Chronic fatigue 06/01/2017  . Essential hypertension, benign 07/09/2016  . 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 Coronary artery calcification seen on CAT scan 05/15/2016  . Anxiety disorder 04/08/2016  . Insomnia 04/08/2016  . 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 Tobacco use disorder 04/08/2016  . Hypercholesteremia 04/08/2016  . Malignant neoplasm of prostate ([place])  02/23/2014    Past Surgical History:  Procedure Laterality Date  . PROSTATE BIOPSY  02/08/14   gleason 4+5=9, 12/12 cores positive, 54 gm  . RADIOACTIVE SEED IMPLANT N/A 06/29/2014   Procedure: RADIOACTIVE SEED IMPLANT;  Surgeon: Bernestine Amass, MD;  Location: [facility];  Service: Urology;  Laterality: N/A;        Home Medications    Prior to Admission medications   Medication Sig Start Date End Date Taking? Authorizing Provider  amLODipine (NORVASC) 2.5 MG tablet Take 1 tablet (2.5 mg total) by mouth daily. 10/19/17   [name], [name], MD  fluticasone [facility]) 50 MCG/ACT nasal spray Place 1 spray into both nostrils daily as needed for allergies or rhinitis.    [provider]  LORazepam (ATIVAN) 1 MG tablet TAKE 1/2 TO 1 TABLET DAILY AS NEEDED 10/19/17   [name], [name], MD  LORazepam (ATIVAN) 1 MG tablet TAKE 1/2 TO 1 TABLET DAILY AS NEEDED 11/08/17   [name], [name], MD  Multiple Vitamin (MULTIVITAMIN WITH MINERALS) TABS tablet Take 1 tablet by mouth daily.    [provider]  naproxen sodium (ANAPROX) 220 MG tablet Take 220 mg by mouth 2 (two) times daily as needed (for soreness).     [provider]  predniSONE (DELTASONE) 5 MG tablet Take 1 tablet (5 mg total) by mouth daily with breakfast. 11/16/17   Wyatt Portela, MD  traZODone (DESYREL) 50 MG tablet Take 0.5-1 tablets (25-50 mg total) by mouth at bedtime as needed for sleep. 10/19/17   [name], [name], MD  triamcinolone (KENALOG) 0.025 % ointment Apply 1 application topically 2 (two) times daily. 12/10/16   [name], [name], MD  ZYTIGA 250 MG tablet TAKE 4 TABLETS (1000 MG) BY MOUTH DAILY. TAKE ON AN EMPTY STOMACH 1 HOUR BEFORE OR 2 HOURS AFTER A MEAL 01/04/18   Wyatt Portela, MD    Family History Family History  Problem Relation Age of Onset  . Alzheimer's disease Mother   . Alzheimer's disease Father   . Cancer Father        prostate  . Arthritis Sister   . Cancer Brother         prostate    Social History Social History   Tobacco Use  . Smoking status: Former Smoker    Years: 60.00    Types: Cigars    Last attempt to quit: 09/14/2012    Years since quitting: 5.3  . Smokeless tobacco: Never Used  . Tobacco comment: little cigars; the small ones; quit smoking 77 yo   Substance Use Topics  . Alcohol use: Yes    Comment: 1 beer daily  . Drug use: No     Allergies   Penicillins   Review of Systems Review of Systems  All other systems reviewed and are negative.    Physical Exam Updated Vital Signs BP (!) 141/99 (BP Location: Left Arm)   Pulse 92   Temp 98.2 F (36.8 C) (Oral)   Resp 16   Ht 5\' 9"  (1.753 m)   Wt 92.5 kg (204 lb)   SpO2 97%   BMI 30.13 kg/m   Physical Exam  Constitutional: He is oriented to person, place, and time. He appears well-developed and well-nourished.  Non-toxic appearance. No distress.  HENT:  Head: Normocephalic and atraumatic.  Mouth/Throat: Oropharynx is clear and moist.  Eyes: Pupils are equal, round, and reactive to light. Conjunctivae are normal. Right eye exhibits no discharge. Left eye exhibits no discharge.  Neck: Normal range of motion. Neck supple.  Cardiovascular: Normal rate, regular rhythm, normal heart sounds and intact distal pulses. Exam reveals no gallop and no friction rub.  No murmur heard. Pulmonary/Chest: Effort normal and breath sounds normal. No stridor. No respiratory distress. He has no wheezes. He has no rales. He exhibits no tenderness.  Abdominal: Soft. Normal appearance and bowel sounds are normal. He exhibits no distension. There is tenderness in the right lower quadrant and left lower quadrant. There is tenderness at McBurney's point. There is no rigidity, no rebound, no guarding, no CVA tenderness and negative Murphy's sign.  Musculoskeletal: Normal range of motion. He exhibits no tenderness.  Lymphadenopathy:    He has no cervical adenopathy.  Neurological: He is alert and oriented  to person, place, and time.  Skin: Skin is warm and dry. Capillary refill takes less than 2 seconds. No rash noted.  Psychiatric: His behavior is normal. Judgment and thought content normal.  Nursing note and vitals reviewed.    ED Treatments / Results  Labs (all labs ordered are listed, but only abnormal results are displayed) Labs Reviewed  HEPATIC FUNCTION PANEL - Abnormal; Notable for the following components:      Result Value   Total Bilirubin 1.9 (*)    Indirect Bilirubin 1.6 (*)    All other components within normal limits  CULTURE, BLOOD (ROUTINE X 2)  CULTURE, BLOOD (ROUTINE X 2)  LIPASE, BLOOD  URINALYSIS, ROUTINE W REFLEX MICROSCOPIC  I-STAT CG4 LACTIC ACID, ED    EKG None  Radiology Ct Abdomen  Pelvis W Contrast  Result Date: 01/21/2018 CLINICAL DATA:  77 year old male with acute RIGHT abdominal and pelvic pain for 3 days. History of prostate cancer. EXAM: CT ABDOMEN AND PELVIS WITH CONTRAST TECHNIQUE: Multidetector CT imaging of the abdomen and pelvis was performed using the standard protocol following bolus administration of intravenous contrast. CONTRAST:  147mL ISOVUE-300 IOPAMIDOL (ISOVUE-300) INJECTION 61% COMPARISON:  02/26/2016 and 02/22/2014 CTs. FINDINGS: Lower chest: No acute abnormality. Cardiomegaly and coronary artery and thoracic aortic atherosclerotic calcifications noted. Hepatobiliary: Probable small RIGHT hepatic cysts again noted. No new or suspicious hepatic abnormalities noted. The gallbladder is unremarkable. No biliary dilatation. Pancreas: Atrophic but otherwise unremarkable. Spleen: Unremarkable Adrenals/Urinary Tract: Mid RIGHT renal scarring and LEFT LOWER pole renal cyst again noted. There is no evidence of hydronephrosis. The adrenal glands and bladder are unremarkable. Stomach/Bowel: An enlarged inflamed appendix with moderate adjacent inflammation is identified and compatible with acute appendicitis. No abscess, pneumoperitoneum or bowel  obstruction. No other bowel abnormalities identified. 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 Aortic atherosclerotic calcifications again noted. No enlarged lymph nodes identified. Reproductive: Prostate seeds are present. Other: No ascites identified. A moderate RIGHT inguinal hernia containing fat is unchanged. Musculoskeletal: No acute abnormalities. No suspicious focal bony lesions are present. 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 IMPRESSION: 1. Acute appendicitis with moderate periappendiceal inflammation. No evidence of free fluid, abscess or pneumoperitoneum. 2. Unchanged 3.1 cm abdominal aortic aneurysm. Recommend followup by ultrasound in 3 years. This recommendation follows ACR consensus guidelines: White Paper of the ACR Incidental Findings Committee II on Vascular Findings. J Am Coll Radiol 2013; 13:086-578 3. Cardiomegaly and coronary artery disease. 4. Unchanged moderate RIGHT inguinal hernia containing fat. Critical Value/emergent results were called by telephone at the time of interpretation on 01/21/2018 at 1:42 pm to Dr. [name] , who verbally acknowledged these results. Electronically Signed   By: Margarette Canada M.D.   On: 01/21/2018 13:42    Procedures Procedures (including critical care time)  Medications Ordered in ED Medications  ciprofloxacin (CIPRO) IVPB 400 mg (400 mg Intravenous New Bag/Given 01/21/18 1542)    And  metroNIDAZOLE (FLAGYL) IVPB 500 mg (has no administration in time range)     Initial Impression / Assessment and Plan / ED Course  I have reviewed the triage vital signs and the nursing notes.  Pertinent labs & imaging results that were available during my care of the patient were reviewed by me and considered in my medical decision making (see chart for details).     To the ED with complaints of 2 days of lower quadrant abdominal pain that has settled in the right lower  side.  Patient denies any other associated symptoms.  Patient is vital signs are reassuring.  He is afebrile.  Does not meet Sirs or sepsis criteria.  Lab work obtained at primary care's office today revealed a white blood cell count of 10,000.  Normal kidney function.  CT scan outpatient setting revealed acute appendicitis without any complications.  Antibiotics were initiated.  Patient is hemodynamically stable at this time.  Spoke with general surgery who will see patient in the ED.  Final Clinical Impressions(s) / ED Diagnoses   Final diagnoses:  Appendicitis, unspecified appendicitis type    ED Discharge Orders    None       Doristine Devoid, PA-C 01/21/18 1615

## 2018-01-21 NOTE — Anesthesia Preprocedure Evaluation (Signed)
Anesthesia Evaluation  Patient identified by MRN, date of birth, ID band Patient awake    Reviewed: Allergy & Precautions, H&P , NPO status , Patient's Chart, lab work & pertinent test results  Airway Mallampati: II  TM Distance: >3 FB Neck ROM: Full    Dental no notable dental hx.    Pulmonary Current Smoker, former smoker,    Pulmonary exam normal breath sounds clear to auscultation       Cardiovascular hypertension, Pt. on medications + CAD and + Peripheral Vascular Disease  Normal cardiovascular exam Rhythm:Regular Rate:Normal  Echo 05/2016  The left ventricular ejection fraction is normal (55-65%).  Nuclear stress EF: 59%.  There was no ST segment deviation noted during stress.  Defect 1: There is a small defect of mild severity present in the apex location.  Findings consistent with ischemia.  This is a low risk study.     Neuro/Psych negative neurological ROS  negative psych ROS   GI/Hepatic negative GI ROS, Neg liver ROS,   Endo/Other  negative endocrine ROS  Renal/GU negative Renal ROS     Musculoskeletal negative musculoskeletal ROS (+)   Abdominal   Peds  Hematology negative hematology ROS (+)   Anesthesia Other Findings   Reproductive/Obstetrics negative OB ROS                            Anesthesia Physical  Anesthesia Plan  ASA: III  Anesthesia Plan: General   Post-op Pain Management:    Induction: Intravenous  PONV Risk Score and Plan: 4 or greater and Ondansetron, Dexamethasone and Treatment may vary due to age or medical condition  Airway Management Planned: Oral ETT  Additional Equipment:   Intra-op Plan:   Post-operative Plan: Extubation in OR  Informed Consent: I have reviewed the patients History and Physical, chart, labs and discussed the procedure including the risks, benefits and alternatives for the proposed anesthesia with the patient or  authorized representative who has indicated his/her understanding and acceptance.   Dental advisory given  Plan Discussed with: CRNA  Anesthesia Plan Comments:         Anesthesia Quick Evaluation

## 2018-01-21 NOTE — Anesthesia Postprocedure Evaluation (Signed)
Anesthesia Post Note  Patient: Tony [name]  Procedure(s) Performed: APPENDECTOMY LAPAROSCOPIC (N/A Abdomen)     Patient location during evaluation: PACU Anesthesia Type: General Level of consciousness: awake and alert Pain management: pain level controlled Vital Signs Assessment: post-procedure vital signs reviewed and stable Respiratory status: spontaneous breathing, nonlabored ventilation and respiratory function stable Cardiovascular status: blood pressure returned to baseline and stable Postop Assessment: no apparent nausea or vomiting Anesthetic complications: no    Last Vitals:  Vitals:   01/21/18 1935 01/21/18 1939  BP:  (!) 146/88  Pulse:  78  Resp:  (!) 22  Temp: 36.5 C 36.4 C  SpO2:  95%    Last Pain:  Vitals:   01/21/18 1939  TempSrc:   PainSc: 0-No pain                 Catalina Gravel

## 2018-01-22 ENCOUNTER — Other Ambulatory Visit: Payer: Self-pay

## 2018-01-22 ENCOUNTER — Encounter (HOSPITAL_COMMUNITY): Payer: Self-pay | Admitting: General Surgery

## 2018-01-22 LAB — CBC
HCT: 40.9 % (ref 39.0–52.0)
Hemoglobin: 13.8 g/dL (ref 13.0–17.0)
MCH: 32.2 pg (ref 26.0–34.0)
MCHC: 33.7 g/dL (ref 30.0–36.0)
MCV: 95.3 fL (ref 78.0–100.0)
PLATELETS: 157 10*3/uL (ref 150–400)
RBC: 4.29 MIL/uL (ref 4.22–5.81)
RDW: 12.8 % (ref 11.5–15.5)
WBC: 7.2 10*3/uL (ref 4.0–10.5)

## 2018-01-22 LAB — BASIC METABOLIC PANEL
Anion gap: 7 (ref 5–15)
BUN: 13 mg/dL (ref 6–20)
CALCIUM: 8.4 mg/dL — AB (ref 8.9–10.3)
CO2: 25 mmol/L (ref 22–32)
CREATININE: 0.76 mg/dL (ref 0.61–1.24)
Chloride: 106 mmol/L (ref 101–111)
GFR calc non Af Amer: >60 mL/min (ref >60)
Glucose, Bld: 187 mg/dL — ABNORMAL HIGH (ref 65–99)
Potassium: 3.9 mmol/L (ref 3.5–5.1)
Sodium: 138 mmol/L (ref 135–145)

## 2018-01-22 MED ORDER — POLYETHYLENE GLYCOL 3350 17 G PO PACK
17.0000 g | PACK | Freq: Every day | ORAL | Status: DC
  Administered 2018-01-22 – 2018-01-24 (×3): 17 g via ORAL
  Filled 2018-01-22 (×3): qty 1

## 2018-01-22 NOTE — Progress Notes (Signed)
Patient received from PACU . Alert and oriented x4. Denies pain.Vital signs stable. Oriented to room and call bell.

## 2018-01-22 NOTE — Plan of Care (Signed)
  Problem: Activity: Goal: Risk for activity intolerance will decrease Outcome: Progressing   Problem: Elimination: Goal: Will not experience complications related to bowel motility Outcome: Progressing   Problem: Pain Managment: Goal: General experience of comfort will improve Outcome: Progressing   

## 2018-01-22 NOTE — Progress Notes (Addendum)
[facility] Progress Note  1 Day Post-Op  Subjective: CC:  Denies abdominal pain. Sitting up in chair drinking clears. Denies belching, nausea, or vomiting. Reports small amt flatus. Denies BM. Last BM about one week ago. Ambulating in hall. Daughter and wife at bedside.  Objective: Vital signs in last 24 hours: Temp:  [97.6 F (36.4 C)-98.3 F (36.8 C)] 97.7 F (36.5 C) (05/11 0909) Pulse Rate:  [49-92] 71 (05/11 0909) Resp:  [16-22] 18 (05/11 0909) BP: (121-164)/(82-99) 125/84 (05/11 0909) SpO2:  [92 %-99 %] 93 % (05/11 0909) Weight:  [92.5 kg (204 lb)] 92.5 kg (204 lb) (05/10 1440) Last BM Date: 01/19/18  Intake/Output from previous day: 05/10 0701 - 05/11 0700 In: 2120 [P.O.:220; I.V.:1500; IV Piggyback:400] Out: 2275 [Urine:2150; Blood:25] Intake/Output this shift: No intake/output data recorded.  PE: Gen:  Alert, NAD, pleasant Card:  Regular rate and rhythm Pulm:  Normal effor Abd: Soft, non-tender, mild distention, bowel sounds present, incisions C/D/I w/ mild ecchymosis  Skin: warm and dry, no rashes  Psych: A&Ox3   Lab Results:  Recent Labs    01/21/18 1500 01/22/18 0450  WBC 10.5 7.2  HGB 15.9 13.8  HCT 46.1 40.9  PLT 162 157   BMET Recent Labs    01/21/18 1012 01/21/18 1500 01/22/18 0450  NA 136  --  138  K 4.0  --  3.9  CL 100  --  106  CO2 27  --  25  GLUCOSE 123*  --  187*  BUN 18  --  13  CREATININE 0.85 0.95 0.76  CALCIUM 8.9  --  8.4*   PT/INR No results for input(s): LABPROT, INR in the last 72 hours. CMP     Component Value Date/Time   NA 138 01/22/2018 0450   NA 141 08/27/2017 0915   K 3.9 01/22/2018 0450   K 4.8 08/27/2017 0915   CL 106 01/22/2018 0450   CO2 25 01/22/2018 0450   CO2 26 08/27/2017 0915   GLUCOSE 187 (H) 01/22/2018 0450   GLUCOSE 108 08/27/2017 0915   BUN 13 01/22/2018 0450   BUN 16.2 08/27/2017 0915   CREATININE 0.76 01/22/2018 0450   CREATININE 0.9 08/27/2017 0915   CALCIUM 8.4 (L)  01/22/2018 0450   CALCIUM 9.8 08/27/2017 0915   PROT 7.3 01/21/2018 1500   PROT 7.3 08/27/2017 0915   ALBUMIN 3.7 01/21/2018 1500   ALBUMIN 4.1 08/27/2017 0915   AST 20 01/21/2018 1500   AST 23 08/27/2017 0915   ALT 17 01/21/2018 1500   ALT 17 08/27/2017 0915   ALKPHOS 75 01/21/2018 1500   ALKPHOS 83 08/27/2017 0915   BILITOT 1.9 (H) 01/21/2018 1500   BILITOT 0.95 08/27/2017 0915   GFRNONAA >60 01/22/2018 0450   GFRAA >60 01/22/2018 0450   Lipase     Component Value Date/Time   LIPASE 21 01/21/2018 1500       Studies/Results: Ct Abdomen Pelvis W Contrast  Result Date: 01/21/2018 CLINICAL DATA:  77 year old male with acute RIGHT abdominal and pelvic pain for 3 days. History of prostate cancer. EXAM: CT ABDOMEN AND PELVIS WITH CONTRAST TECHNIQUE: Multidetector CT imaging of the abdomen and pelvis was performed using the standard protocol following bolus administration of intravenous contrast. CONTRAST:  142mL ISOVUE-300 IOPAMIDOL (ISOVUE-300) INJECTION 61% COMPARISON:  02/26/2016 and 02/22/2014 CTs. FINDINGS: Lower chest: No acute abnormality. Cardiomegaly and coronary artery and thoracic aortic atherosclerotic calcifications noted. Hepatobiliary: Probable small RIGHT hepatic cysts again noted. No new or suspicious hepatic  abnormalities noted. The gallbladder is unremarkable. No biliary dilatation. Pancreas: Atrophic but otherwise unremarkable. Spleen: Unremarkable Adrenals/Urinary Tract: Mid RIGHT renal scarring and LEFT LOWER pole renal cyst again noted. There is no evidence of hydronephrosis. The adrenal glands and bladder are unremarkable. Stomach/Bowel: An enlarged inflamed appendix with moderate adjacent inflammation is identified and compatible with acute appendicitis. No abscess, pneumoperitoneum or bowel obstruction. No other bowel abnormalities identified. 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 Aortic atherosclerotic  calcifications again noted. No enlarged lymph nodes identified. Reproductive: Prostate seeds are present. Other: No ascites identified. A moderate RIGHT inguinal hernia containing fat is unchanged. Musculoskeletal: No acute abnormalities. No suspicious focal bony lesions are present. 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 IMPRESSION: 1. Acute appendicitis with moderate periappendiceal inflammation. No evidence of free fluid, abscess or pneumoperitoneum. 2. Unchanged 3.1 cm abdominal aortic aneurysm. Recommend followup by ultrasound in 3 years. This recommendation follows ACR consensus guidelines: White Paper of the ACR Incidental Findings Committee II on Vascular Findings. J Am Coll Radiol 2013; 62:563-893 3. Cardiomegaly and coronary artery disease. 4. Unchanged moderate RIGHT inguinal hernia containing fat. Critical Value/emergent results were called by telephone at the time of interpretation on 01/21/2018 at 1:42 pm to Dr. [name] , who verbally acknowledged these results. Electronically Signed   By: Margarette Canada M.D.   On: 01/21/2018 13:42    Anti-infectives: Anti-infectives (From admission, onward)   Start     Dose/Rate Route Frequency Ordered Stop   01/21/18 2100  gentamicin (GARAMYCIN) 560 mg in dextrose 5 % 100 mL IVPB     560 mg 114 mL/hr over 60 Minutes Intravenous Every 24 hours 01/21/18 2023     01/21/18 1958  ciprofloxacin (CIPRO) IVPB 400 mg     400 mg 200 mL/hr over 60 Minutes Intravenous Every 12 hours 01/21/18 1958     01/21/18 1958  metroNIDAZOLE (FLAGYL) IVPB 500 mg     500 mg 100 mL/hr over 60 Minutes Intravenous Every 8 hours 01/21/18 1958     01/21/18 1500  ciprofloxacin (CIPRO) IVPB 400 mg     400 mg 200 mL/hr over 60 Minutes Intravenous  Once 01/21/18 1454 01/21/18 1648   01/21/18 1500  metroNIDAZOLE (FLAGYL) IVPB 500 mg     500 mg 100 mL/hr over 60 Minutes Intravenous  Once 01/21/18 1454 01/21/18 1748     Assessment/Plan Acute  appendicitis with perforation POD#1 s/p laparoscopic appendectomy  - afebrile, VSS, CBC and BMET stable - clear liquid diet, advance to full liquids  Today - continue IV abx given perforation - mobilize/IS  Prostate CA - home prednisone and Zytiga  HTN - home meds FEN: clears ID: gentamycin, cipro, flagyl 5/10 >> (PCN allergic); can probably narrow these tomorrow VTE - SCD's lovenox  Foley - none    LOS: 1 day    Jill Alexanders , [facility] Surgery 01/22/2018, 9:12 AM Pager: [phone] Consults: [phone] Mon-Fri 7:00 am-4:30 pm Sat-Sun 7:00 am-11:30 am

## 2018-01-23 LAB — CBC
HCT: 38.7 % — ABNORMAL LOW (ref 39.0–52.0)
Hemoglobin: 12.8 g/dL — ABNORMAL LOW (ref 13.0–17.0)
MCH: 31.6 pg (ref 26.0–34.0)
MCHC: 33.1 g/dL (ref 30.0–36.0)
MCV: 95.6 fL (ref 78.0–100.0)
PLATELETS: 173 10*3/uL (ref 150–400)
RBC: 4.05 MIL/uL — AB (ref 4.22–5.81)
RDW: 12.8 % (ref 11.5–15.5)
WBC: 8.1 10*3/uL (ref 4.0–10.5)

## 2018-01-23 MED ORDER — TRAMADOL HCL 50 MG PO TABS: 50.0000 mg | ORAL_TABLET | Freq: Four times a day (QID) | ORAL | 0 refills | Status: DC | PRN

## 2018-01-23 MED ORDER — ACETAMINOPHEN 325 MG PO TABS: 650.0000 mg | ORAL_TABLET | Freq: Four times a day (QID) | ORAL | Status: DC | PRN

## 2018-01-23 MED ORDER — METRONIDAZOLE 500 MG PO TABS: 500.0000 mg | ORAL_TABLET | Freq: Two times a day (BID) | ORAL | 0 refills | Status: AC

## 2018-01-23 MED ORDER — CIPROFLOXACIN HCL 500 MG PO TABS: 500.0000 mg | ORAL_TABLET | Freq: Two times a day (BID) | ORAL | 0 refills | Status: AC

## 2018-01-23 NOTE — Plan of Care (Signed)
  Problem: Education: Goal: Knowledge of General Education information will improve Outcome: Progressing   Problem: Clinical Measurements: Goal: Ability to maintain clinical measurements within normal limits will improve Outcome: Progressing   Problem: Clinical Measurements: Goal: Will remain free from infection Outcome: Progressing

## 2018-01-23 NOTE — Discharge Instructions (Signed)
please arrive at least 30 min before your appointment to complete your check in paperwork.  If you are unable to arrive 30 min prior to your appointment time we may have to cancel or reschedule you. ° °LAPAROSCOPIC SURGERY: POST OP INSTRUCTIONS  °1. DIET: Follow a light bland diet the first 24 hours after arrival home, such as soup, liquids, crackers, etc. Be sure to include lots of fluids daily. Avoid fast food or heavy meals as your are more likely to get nauseated. Eat a low fat the next few days after surgery.  °2. Take your usually prescribed home medications unless otherwise directed. °3. PAIN CONTROL:  °1. Pain is best controlled by a usual combination of three different methods TOGETHER:  °1. Ice/Heat °2. Over the counter pain medication °3. Prescription pain medication °2. Most patients will experience some swelling and bruising around the incisions. Ice packs or heating pads (30-60 minutes up to 6 times a day) will help. Use ice for the first few days to help decrease swelling and bruising, then switch to heat to help relax tight/sore spots and speed recovery. Some people prefer to use ice alone, heat alone, alternating between ice & heat. Experiment to what works for you. Swelling and bruising can take several weeks to resolve.  °3. It is helpful to take an over-the-counter pain medication regularly for the first few weeks. Choose one of the following that works best for you:  °1. Naproxen (Aleve, etc) Two 220mg tabs twice a day °2. Ibuprofen (Advil, etc) Three 200mg tabs four times a day (every meal & bedtime) °3. Acetaminophen (Tylenol, etc) 500-650mg four times a day (every meal & bedtime) °4. A prescription for pain medication (such as oxycodone, hydrocodone, etc) should be given to you upon discharge. Take your pain medication as prescribed.  °1. If you are having problems/concerns with the prescription medicine (does not control pain, nausea, vomiting, rash, itching, etc), please call us [phone] to see if we need to switch you to a different pain medicine that will work better for you and/or control your side effect better. °2. If you need a refill on your pain medication, please contact your pharmacy. They will contact our office to request authorization. Prescriptions will not be filled after 5 pm or on week-ends. °4. Avoid getting constipated. Between the surgery and the pain medications, it is common to experience some constipation. Increasing fluid intake and taking a fiber supplement (such as Metamucil, Citrucel, FiberCon, MiraLax, etc) 1-2 times a day regularly will usually help prevent this problem from occurring. A mild laxative (prune juice, Milk of Magnesia, MiraLax, etc) should be taken according to package directions if there are no bowel movements after 48 hours.  °5. Watch out for diarrhea. If you have many loose bowel movements, simplify your diet to bland foods & liquids for a few days. Stop any stool softeners and decrease your fiber supplement. Switching to mild anti-diarrheal medications (Kayopectate, Pepto Bismol) can help. If this worsens or does not improve, please call us. °6. Wash / shower every day. You may shower over the dressings as they are waterproof. Continue to shower over incision(s) after the dressing is off. °7. Remove your waterproof bandages 5 days after surgery. You may leave the incision open to air. You may replace a dressing/Band-Aid to cover the incision for comfort if you wish.  °8. ACTIVITIES as tolerated:  °1. You may resume regular (light) daily activities beginning the next day--such as daily self-care, walking, climbing stairs--gradually   increasing activities as tolerated. If you can walk 30 minutes without difficulty, it is safe to try more intense activity such as jogging, treadmill, bicycling, low-impact aerobics, swimming, etc. °2. Save the most intensive and strenuous activity for last such as sit-ups, heavy lifting, contact sports, etc Refrain  from any heavy lifting or straining until you are off narcotics for pain control.  °3. DO NOT PUSH THROUGH PAIN. Let pain be your guide: If it hurts to do something, don't do it. Pain is your body warning you to avoid that activity for another week until the pain goes down. °4. You may drive when you are no longer taking prescription pain medication, you can comfortably wear a seatbelt, and you can safely maneuver your car and apply brakes. °5. You may have sexual intercourse when it is comfortable.  °9. FOLLOW UP in our office  °1. Please call [facility] at [phone] to set up an appointment to see your surgeon in the office for a follow-up appointment approximately 2-3 weeks after your surgery. °2. Make sure that you call for this appointment the day you arrive home to insure a convenient appointment time. °     10. IF YOU HAVE DISABILITY OR FAMILY LEAVE FORMS, BRING THEM TO THE               OFFICE FOR PROCESSING.  ° °WHEN TO CALL US [phone]:  °1. Poor pain control °2. Reactions / problems with new medications (rash/itching, nausea, etc)  °3. Fever over 101.5 F (38.5 C) °4. Inability to urinate °5. Nausea and/or vomiting °6. Worsening swelling or bruising °7. Continued bleeding from incision. °8. Increased pain, redness, or drainage from the incision ° °The clinic staff is available to answer your questions during regular business hours (8:30am-5pm). Please don’t hesitate to call and ask to speak to one of our nurses for clinical concerns.  °If you have a medical emergency, go to the nearest emergency room or call 911.  °A surgeon from [facility] is always on call at the hospitals  ° °[facility]  °[address], [address], [place], [place] [address] ?  °MAIN: [phone] ? TOLL FREE: [phone] ?  °FAX [phone]  °[email or web address] ° °

## 2018-01-23 NOTE — Progress Notes (Addendum)
[facility] Progress Note  2 Days Post-Op  Subjective: CC:  Pain controlled. Tolerating full liquids without N/V. Denies belching. Having small amt flatus. No BM. Mobilizing in hallway.   Objective: Vital signs in last 24 hours: Temp:  [97.6 F (36.4 C)-98.8 F (37.1 C)] 98.6 F (37 C) (05/12 0532) Pulse Rate:  [52-71] 52 (05/12 0532) Resp:  [16-18] 16 (05/12 0532) BP: (117-140)/(76-93) 140/93 (05/12 0532) SpO2:  [93 %-98 %] 98 % (05/12 0532) Last BM Date: 01/19/18  Intake/Output from previous day: 05/11 0701 - 05/12 0700 In: 2266.7 [P.O.:700; I.V.:1166.7; IV Piggyback:400] Out: -  Intake/Output this shift: No intake/output data recorded.  PE: Gen:  Alert, NAD, pleasant Card:  Regular rate and rhythm Pulm:  Normal effor Abd: Soft, non-tender, mild distention, bowel sounds present, incisions C/D/I w/ mild ecchymosis  Skin: warm and dry, no rashes  Psych: A&Ox3   Lab Results:  Recent Labs    01/22/18 0450 01/23/18 0310  WBC 7.2 8.1  HGB 13.8 12.8*  HCT 40.9 38.7*  PLT 157 173   BMET Recent Labs    01/21/18 1012 01/21/18 1500 01/22/18 0450  NA 136  --  138  K 4.0  --  3.9  CL 100  --  106  CO2 27  --  25  GLUCOSE 123*  --  187*  BUN 18  --  13  CREATININE 0.85 0.95 0.76  CALCIUM 8.9  --  8.4*   PT/INR No results for input(s): LABPROT, INR in the last 72 hours. CMP     Component Value Date/Time   NA 138 01/22/2018 0450   NA 141 08/27/2017 0915   K 3.9 01/22/2018 0450   K 4.8 08/27/2017 0915   CL 106 01/22/2018 0450   CO2 25 01/22/2018 0450   CO2 26 08/27/2017 0915   GLUCOSE 187 (H) 01/22/2018 0450   GLUCOSE 108 08/27/2017 0915   BUN 13 01/22/2018 0450   BUN 16.2 08/27/2017 0915   CREATININE 0.76 01/22/2018 0450   CREATININE 0.9 08/27/2017 0915   CALCIUM 8.4 (L) 01/22/2018 0450   CALCIUM 9.8 08/27/2017 0915   PROT 7.3 01/21/2018 1500   PROT 7.3 08/27/2017 0915   ALBUMIN 3.7 01/21/2018 1500   ALBUMIN 4.1 08/27/2017 0915   AST  20 01/21/2018 1500   AST 23 08/27/2017 0915   ALT 17 01/21/2018 1500   ALT 17 08/27/2017 0915   ALKPHOS 75 01/21/2018 1500   ALKPHOS 83 08/27/2017 0915   BILITOT 1.9 (H) 01/21/2018 1500   BILITOT 0.95 08/27/2017 0915   GFRNONAA >60 01/22/2018 0450   GFRAA >60 01/22/2018 0450   Lipase     Component Value Date/Time   LIPASE 21 01/21/2018 1500       Studies/Results: Ct Abdomen Pelvis W Contrast  Result Date: 01/21/2018 CLINICAL DATA:  77 year old male with acute RIGHT abdominal and pelvic pain for 3 days. History of prostate cancer. EXAM: CT ABDOMEN AND PELVIS WITH CONTRAST TECHNIQUE: Multidetector CT imaging of the abdomen and pelvis was performed using the standard protocol following bolus administration of intravenous contrast. CONTRAST:  163mL ISOVUE-300 IOPAMIDOL (ISOVUE-300) INJECTION 61% COMPARISON:  02/26/2016 and 02/22/2014 CTs. FINDINGS: Lower chest: No acute abnormality. Cardiomegaly and coronary artery and thoracic aortic atherosclerotic calcifications noted. Hepatobiliary: Probable small RIGHT hepatic cysts again noted. No new or suspicious hepatic abnormalities noted. The gallbladder is unremarkable. No biliary dilatation. Pancreas: Atrophic but otherwise unremarkable. Spleen: Unremarkable Adrenals/Urinary Tract: Mid RIGHT renal scarring and LEFT LOWER pole renal cyst  again noted. There is no evidence of hydronephrosis. The adrenal glands and bladder are unremarkable. Stomach/Bowel: An enlarged inflamed appendix with moderate adjacent inflammation is identified and compatible with acute appendicitis. No abscess, pneumoperitoneum or bowel obstruction. No other bowel abnormalities identified. 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 Aortic atherosclerotic calcifications again noted. No enlarged lymph nodes identified. Reproductive: Prostate seeds are present. Other: No ascites identified. A moderate RIGHT inguinal hernia containing fat  is unchanged. Musculoskeletal: No acute abnormalities. No suspicious focal bony lesions are present. 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 IMPRESSION: 1. Acute appendicitis with moderate periappendiceal inflammation. No evidence of free fluid, abscess or pneumoperitoneum. 2. Unchanged 3.1 cm abdominal aortic aneurysm. Recommend followup by ultrasound in 3 years. This recommendation follows ACR consensus guidelines: White Paper of the ACR Incidental Findings Committee II on Vascular Findings. J Am Coll Radiol 2013; 10:932-355 3. Cardiomegaly and coronary artery disease. 4. Unchanged moderate RIGHT inguinal hernia containing fat. Critical Value/emergent results were called by telephone at the time of interpretation on 01/21/2018 at 1:42 pm to Dr. [name] , who verbally acknowledged these results. Electronically Signed   By: Margarette Canada M.D.   On: 01/21/2018 13:42    Anti-infectives: Anti-infectives (From admission, onward)   Start     Dose/Rate Route Frequency Ordered Stop   01/21/18 2100  gentamicin (GARAMYCIN) 560 mg in dextrose 5 % 100 mL IVPB  Status:  Discontinued     560 mg 114 mL/hr over 60 Minutes Intravenous Every 24 hours 01/21/18 2023 01/22/18 1208   01/21/18 1958  ciprofloxacin (CIPRO) IVPB 400 mg     400 mg 200 mL/hr over 60 Minutes Intravenous Every 12 hours 01/21/18 1958     01/21/18 1958  metroNIDAZOLE (FLAGYL) IVPB 500 mg     500 mg 100 mL/hr over 60 Minutes Intravenous Every 8 hours 01/21/18 1958     01/21/18 1500  ciprofloxacin (CIPRO) IVPB 400 mg     400 mg 200 mL/hr over 60 Minutes Intravenous  Once 01/21/18 1454 01/21/18 1648   01/21/18 1500  metroNIDAZOLE (FLAGYL) IVPB 500 mg     500 mg 100 mL/hr over 60 Minutes Intravenous  Once 01/21/18 1454 01/21/18 1748     Assessment/Plan Acute appendicitis with perforation POD#2 s/p laparoscopic appendectomy, Dr. Kieth Brightly 01/21/18  - afebrile, VSS, CBC stable - advance to soft diet -  continue IV abx given perforation, transition to PO abx at discharge - mobilize/IS  Prostate CA - home prednisone and Zytiga  HTN - home meds FEN: clears ID: gentamycin 5/10-5/11, cipro/flagyl 5/10 >> (PCN allergic) VTE - SCD's lovenox  Foley - none   Dispo - saline lock IV, advance diet, Miralax for BM (has not had one in over 1 week); will discuss timing of discharge with MD, possibly tomorrow AM.   LOS: 2 days    Jill Alexanders , [facility] 01/23/2018, 7:56 AM Pager: [phone] Consults: [phone] Mon-Fri 7:00 am-4:30 pm Sat-Sun 7:00 am-11:30 am

## 2018-01-24 MED ORDER — CIPROFLOXACIN HCL 500 MG PO TABS: 500.0000 mg | ORAL_TABLET | Freq: Two times a day (BID) | ORAL | 0 refills | Status: AC

## 2018-01-24 MED ORDER — METRONIDAZOLE 500 MG PO TABS: 500.0000 mg | ORAL_TABLET | Freq: Three times a day (TID) | ORAL | 0 refills | Status: AC

## 2018-01-24 MED ORDER — TRAMADOL HCL 50 MG PO TABS: 50.0000 mg | ORAL_TABLET | Freq: Four times a day (QID) | ORAL | 0 refills | Status: DC | PRN

## 2018-01-24 MED ORDER — POLYETHYLENE GLYCOL 3350 17 G PO PACK: 17.0000 g | PACK | Freq: Every day | ORAL | 0 refills | Status: DC | PRN

## 2018-01-24 MED ORDER — PSYLLIUM 28 % PO PACK: 1.0000 | PACK | Freq: Two times a day (BID) | ORAL | 11 refills | Status: DC

## 2018-01-24 NOTE — Progress Notes (Signed)
Patient discharged to home with instructions and prescriptions. 

## 2018-01-24 NOTE — Discharge Summary (Addendum)
[facility] Discharge Summary   Patient ID: Tony [name] MRN: [id number] DOB/AGE: 76-30-1942 76 y.o.  Admit date: 01/21/2018 Discharge date: 01/24/2018  Admitting Diagnosis: Perforated appendicits  Discharge Diagnosis Patient Active Problem List   Diagnosis Date Noted  . Acute appendicitis 01/21/2018  . Perforated appendicitis 01/21/2018  . Chronic fatigue 06/01/2017  . Essential hypertension, benign 07/09/2016  . 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[place]) 07/06/2016  . Coronary artery calcification seen on CAT scan 05/15/2016  . Anxiety disorder 04/08/2016  . Insomnia 04/08/2016  . 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 Tobacco use disorder 04/08/2016  . Hypercholesteremia 04/08/2016  . Malignant neoplasm of prostate ([place]) 02/23/2014    Consultants None  Imaging: No results found.  Procedures Dr. Kieth Brightly (01/21/18) - Laparoscopic Appendectomy  Hospital Course:  Tony [name] is a 77yo male who presented to [facility] 5/10 with acute onset abdominal pain.  Workup showed perforated appendicitis.  Patient was admitted and underwent procedure listed above.  Tolerated procedure well and was transferred to the floor. Diet was advanced as tolerated.  On POD3, the patient was voiding well, tolerating diet, ambulating well, pain well controlled, vital signs stable, incisions c/d/i and felt stable for discharge home.  He will complete a 5 day course of cipro/flagyl. Patient will follow up as below and knows to call with questions or concerns.  He will need a colonoscopy in 5-6 weeks.  I have personally reviewed the patients medication history on the [place] controlled substance database.    Physical Exam: General:  Alert, NAD, pleasant, comfortable Pulm: effort normal Cardio: RRR Abd:  Soft, mild distension, +BS, appropriately tender, lap incisions C/D/I  Allergies as of 01/24/2018      Reactions   Penicillins  Rash   Has patient had a PCN reaction causing immediate rash, facial/tongue/throat swelling, SOB or lightheadedness with hypotension: Yes Has patient had a PCN reaction causing severe rash involving mucus membranes or skin necrosis: No Has patient had a PCN reaction that required hospitalization: No Has patient had a PCN reaction occurring within the last 10 years: No If all of the above answers are "NO", then may proceed with Cephalosporin use.      Medication List    STOP taking these medications   triamcinolone 0.025 % ointment Commonly known as:  KENALOG     TAKE these medications   acetaminophen 325 MG tablet Commonly known as:  TYLENOL Take 2 tablets (650 mg total) by mouth every 6 (six) hours as needed for mild pain (or Fever >/= 101).   amLODipine 2.5 MG tablet Commonly known as:  NORVASC Take 1 tablet (2.5 mg total) by mouth daily.   ciprofloxacin 500 MG tablet Commonly known as:  CIPRO Take 1 tablet (500 mg total) by mouth 2 (two) times daily for 5 days.   ciprofloxacin 500 MG tablet Commonly known as:  CIPRO Take 1 tablet (500 mg total) by mouth 2 (two) times daily for 3 days.   fluticasone 50 MCG/ACT nasal spray Commonly known as:  FLONASE Place 1 spray into both nostrils daily as needed for allergies or rhinitis.   LORazepam 1 MG tablet Commonly known as:  ATIVAN TAKE 1/2 TO 1 TABLET DAILY AS NEEDED What changed:  Another medication with the same name was changed. Make sure you understand how and when to take each.   LORazepam 1 MG tablet Commonly known as:  ATIVAN TAKE 1/2 TO 1 TABLET DAILY AS NEEDED What changed:  See the new instructions.   metroNIDAZOLE 500 MG tablet Commonly known as:  FLAGYL Take 1 tablet (500 mg total) by mouth 2 (two) times daily with a meal for 5 days. DO NOT CONSUME ALCOHOL WHILE TAKING THIS MEDICATION.   metroNIDAZOLE 500 MG tablet Commonly known as:  FLAGYL Take 1 tablet (500 mg total) by mouth 3 (three) times daily for 3  days.   multivitamin with minerals Tabs tablet Take 1 tablet by mouth daily.   naproxen sodium 220 MG tablet Commonly known as:  ALEVE Take 220 mg by mouth 2 (two) times daily as needed (pain/headache).   polyethylene glycol packet Commonly known as:  MIRALAX / GLYCOLAX Take 17 g by mouth daily as needed for severe constipation.   predniSONE 5 MG tablet Commonly known as:  DELTASONE Take 1 tablet (5 mg total) by mouth daily with breakfast.   psyllium 28 % packet Commonly known as:  METAMUCIL SMOOTH TEXTURE Take 1 packet by mouth 2 (two) times daily.   traMADol 50 MG tablet Commonly known as:  ULTRAM Take 1 tablet (50 mg total) by mouth every 6 (six) hours as needed (mild pain).   traZODone 50 MG tablet Commonly known as:  DESYREL Take 0.5-1 tablets (25-50 mg total) by mouth at bedtime as needed for sleep. What changed:    how much to take  when to take this   ZYTIGA 250 MG tablet Generic drug:  abiraterone acetate TAKE 4 TABLETS (1000 MG) BY MOUTH DAILY. TAKE ON AN EMPTY STOMACH 1 HOUR BEFORE OR 2 HOURS AFTER A MEAL        Follow-up Information    [facility], [place]. Call.   Specialty:  General Surgery Why:  call to confirm post-operative follow up appointment date/time. Contact information: [address] [place] [phone]          Signed: Wellington Hampshire, [facility] 01/24/2018, 7:55 AM Pager: [phone] Consults: [phone] Mon-Fri 7:00 am-4:30 pm Sat-Sun 7:00 am-11:30 am

## 2018-01-24 NOTE — Consult Note (Signed)
            [facility] CM Primary Care Navigator  01/24/2018  Abhishek Levesque Houchin [birth year] [id number]   Went to seepatient at the bedside to identify possible discharge needsbuthe was already dischargedhome per RN report.  Patient was seen for acute onset abdominal pain with workup showing perforated appendicitis and underwent laparoscopic appendectomy.  Primary care provider's officeis listed as providingtransition of care (TOC)follow-up.   Patient has discharge instruction to call and confirm post-operative follow up appointment withgeneral surgery after discharge.   For additional questions please contact:  Edwena Felty A. Samera Macy, BSN, RN-BC [facility] PRIMARY CARE Navigator Cell: [phone]

## 2018-01-26 LAB — CULTURE, BLOOD (ROUTINE X 2)
CULTURE: NO GROWTH
Culture: NO GROWTH
Special Requests: ADEQUATE

## 2018-01-31 ENCOUNTER — Other Ambulatory Visit: Payer: Self-pay | Admitting: Oncology

## 2018-02-02 ENCOUNTER — Emergency Department (HOSPITAL_COMMUNITY): Payer: Medicare Other

## 2018-02-02 ENCOUNTER — Other Ambulatory Visit: Payer: Self-pay

## 2018-02-02 ENCOUNTER — Encounter: Payer: Self-pay | Admitting: Adult Health

## 2018-02-02 ENCOUNTER — Inpatient Hospital Stay (HOSPITAL_COMMUNITY)
Admission: EM | Admit: 2018-02-02 | Discharge: 2018-02-09 | DRG: 381 | Disposition: A | Discharge status: Home / Self Care | Payer: Medicare Other | Source: Ambulatory Visit | Attending: Family Medicine | Admitting: Family Medicine

## 2018-02-02 ENCOUNTER — Ambulatory Visit (INDEPENDENT_AMBULATORY_CARE_PROVIDER_SITE_OTHER): Payer: Medicare Other | Admitting: Adult Health

## 2018-02-02 ENCOUNTER — Encounter (HOSPITAL_COMMUNITY): Payer: Self-pay

## 2018-02-02 VITALS — BP 130/80 | HR 80 | Temp 97.5°F | Wt 200.0 lb

## 2018-02-02 DIAGNOSIS — R1110 Vomiting, unspecified: Secondary | ICD-10-CM | POA: Diagnosis not present

## 2018-02-02 DIAGNOSIS — R1011 Right upper quadrant pain: Secondary | ICD-10-CM | POA: Diagnosis not present

## 2018-02-02 DIAGNOSIS — Z923 Personal history of irradiation: Secondary | ICD-10-CM

## 2018-02-02 DIAGNOSIS — E669 Obesity, unspecified: Secondary | ICD-10-CM | POA: Diagnosis present

## 2018-02-02 DIAGNOSIS — I10 Essential (primary) hypertension: Secondary | ICD-10-CM | POA: Diagnosis not present

## 2018-02-02 DIAGNOSIS — Z9049 Acquired absence of other specified parts of digestive tract: Secondary | ICD-10-CM

## 2018-02-02 DIAGNOSIS — R630 Anorexia: Secondary | ICD-10-CM | POA: Diagnosis present

## 2018-02-02 DIAGNOSIS — D649 Anemia, unspecified: Secondary | ICD-10-CM | POA: Diagnosis not present

## 2018-02-02 DIAGNOSIS — R600 Localized edema: Secondary | ICD-10-CM | POA: Diagnosis present

## 2018-02-02 DIAGNOSIS — K2980 Duodenitis without bleeding: Secondary | ICD-10-CM | POA: Diagnosis present

## 2018-02-02 DIAGNOSIS — Z87891 Personal history of nicotine dependence: Secondary | ICD-10-CM

## 2018-02-02 DIAGNOSIS — R058 Other specified cough: Secondary | ICD-10-CM

## 2018-02-02 DIAGNOSIS — R948 Abnormal results of function studies of other organs and systems: Secondary | ICD-10-CM | POA: Diagnosis not present

## 2018-02-02 DIAGNOSIS — E7800 Pure hypercholesterolemia, unspecified: Secondary | ICD-10-CM | POA: Diagnosis present

## 2018-02-02 DIAGNOSIS — I712 Thoracic aortic aneurysm, without rupture, unspecified: Secondary | ICD-10-CM | POA: Diagnosis present

## 2018-02-02 DIAGNOSIS — R269 Unspecified abnormalities of gait and mobility: Secondary | ICD-10-CM | POA: Diagnosis not present

## 2018-02-02 DIAGNOSIS — I2510 Atherosclerotic heart disease of native coronary artery without angina pectoris: Secondary | ICD-10-CM | POA: Diagnosis not present

## 2018-02-02 DIAGNOSIS — G4730 Sleep apnea, unspecified: Secondary | ICD-10-CM | POA: Diagnosis present

## 2018-02-02 DIAGNOSIS — R9431 Abnormal electrocardiogram [ECG] [EKG]: Secondary | ICD-10-CM | POA: Diagnosis not present

## 2018-02-02 DIAGNOSIS — K2960 Other gastritis without bleeding: Secondary | ICD-10-CM | POA: Diagnosis not present

## 2018-02-02 DIAGNOSIS — Z6833 Body mass index (BMI) 33.0-33.9, adult: Secondary | ICD-10-CM

## 2018-02-02 DIAGNOSIS — R000 Tachycardia, unspecified: Secondary | ICD-10-CM | POA: Diagnosis not present

## 2018-02-02 DIAGNOSIS — I739 Peripheral vascular disease, unspecified: Secondary | ICD-10-CM | POA: Diagnosis present

## 2018-02-02 DIAGNOSIS — K449 Diaphragmatic hernia without obstruction or gangrene: Secondary | ICD-10-CM | POA: Diagnosis present

## 2018-02-02 DIAGNOSIS — R05 Cough: Secondary | ICD-10-CM | POA: Diagnosis not present

## 2018-02-02 DIAGNOSIS — C61 Malignant neoplasm of prostate: Secondary | ICD-10-CM | POA: Diagnosis not present

## 2018-02-02 DIAGNOSIS — K2990 Gastroduodenitis, unspecified, without bleeding: Secondary | ICD-10-CM | POA: Diagnosis not present

## 2018-02-02 DIAGNOSIS — Z7982 Long term (current) use of aspirin: Secondary | ICD-10-CM

## 2018-02-02 DIAGNOSIS — D630 Anemia in neoplastic disease: Secondary | ICD-10-CM | POA: Diagnosis not present

## 2018-02-02 DIAGNOSIS — R402414 Glasgow coma scale score 13-15, 24 hours or more after hospital admission: Secondary | ICD-10-CM | POA: Diagnosis not present

## 2018-02-02 DIAGNOSIS — Z880 Allergy status to penicillin: Secondary | ICD-10-CM

## 2018-02-02 DIAGNOSIS — K920 Hematemesis: Secondary | ICD-10-CM | POA: Diagnosis not present

## 2018-02-02 DIAGNOSIS — I444 Left anterior fascicular block: Secondary | ICD-10-CM | POA: Diagnosis present

## 2018-02-02 DIAGNOSIS — R933 Abnormal findings on diagnostic imaging of other parts of digestive tract: Secondary | ICD-10-CM | POA: Diagnosis not present

## 2018-02-02 DIAGNOSIS — I452 Bifascicular block: Secondary | ICD-10-CM | POA: Diagnosis present

## 2018-02-02 DIAGNOSIS — R0602 Shortness of breath: Secondary | ICD-10-CM | POA: Diagnosis not present

## 2018-02-02 DIAGNOSIS — K8689 Other specified diseases of pancreas: Secondary | ICD-10-CM | POA: Diagnosis present

## 2018-02-02 DIAGNOSIS — I447 Left bundle-branch block, unspecified: Secondary | ICD-10-CM | POA: Diagnosis present

## 2018-02-02 DIAGNOSIS — J439 Emphysema, unspecified: Secondary | ICD-10-CM | POA: Diagnosis not present

## 2018-02-02 DIAGNOSIS — F419 Anxiety disorder, unspecified: Secondary | ICD-10-CM | POA: Diagnosis present

## 2018-02-02 DIAGNOSIS — Z79899 Other long term (current) drug therapy: Secondary | ICD-10-CM

## 2018-02-02 DIAGNOSIS — K311 Adult hypertrophic pyloric stenosis: Principal | ICD-10-CM

## 2018-02-02 DIAGNOSIS — K209 Esophagitis, unspecified: Secondary | ICD-10-CM | POA: Diagnosis not present

## 2018-02-02 DIAGNOSIS — K210 Gastro-esophageal reflux disease with esophagitis: Secondary | ICD-10-CM | POA: Diagnosis present

## 2018-02-02 DIAGNOSIS — Z9889 Other specified postprocedural states: Secondary | ICD-10-CM

## 2018-02-02 DIAGNOSIS — J449 Chronic obstructive pulmonary disease, unspecified: Secondary | ICD-10-CM | POA: Diagnosis present

## 2018-02-02 DIAGNOSIS — E876 Hypokalemia: Secondary | ICD-10-CM | POA: Diagnosis not present

## 2018-02-02 DIAGNOSIS — R112 Nausea with vomiting, unspecified: Secondary | ICD-10-CM | POA: Diagnosis not present

## 2018-02-02 DIAGNOSIS — Z7952 Long term (current) use of systemic steroids: Secondary | ICD-10-CM

## 2018-02-02 DIAGNOSIS — M7981 Nontraumatic hematoma of soft tissue: Secondary | ICD-10-CM | POA: Diagnosis not present

## 2018-02-02 HISTORY — DX: Adult hypertrophic pyloric stenosis: K31.1

## 2018-02-02 HISTORY — DX: Gastro-esophageal reflux disease without esophagitis: K21.9

## 2018-02-02 HISTORY — DX: Personal history of other diseases of the digestive system: Z87.19

## 2018-02-02 LAB — CBC
HEMATOCRIT: 42.7 % (ref 39.0–52.0)
HEMOGLOBIN: 14.5 g/dL (ref 13.0–17.0)
MCH: 31.3 pg (ref 26.0–34.0)
MCHC: 34 g/dL (ref 30.0–36.0)
MCV: 92.2 fL (ref 78.0–100.0)
Platelets: 415 10*3/uL — ABNORMAL HIGH (ref 150–400)
RBC: 4.63 MIL/uL (ref 4.22–5.81)
RDW: 12.7 % (ref 11.5–15.5)
WBC: 12.8 10*3/uL — ABNORMAL HIGH (ref 4.0–10.5)

## 2018-02-02 LAB — URINALYSIS, ROUTINE W REFLEX MICROSCOPIC
BILIRUBIN URINE: NEGATIVE
Bacteria, UA: NONE SEEN
GLUCOSE, UA: NEGATIVE mg/dL
HGB URINE DIPSTICK: NEGATIVE
Ketones, ur: 20 mg/dL — AB
LEUKOCYTES UA: NEGATIVE
NITRITE: NEGATIVE
PH: 7 (ref 5.0–8.0)
Protein, ur: 30 mg/dL — AB

## 2018-02-02 LAB — HEPATIC FUNCTION PANEL
ALK PHOS: 63 U/L (ref 38–126)
ALT: 15 U/L — AB (ref 17–63)
AST: 20 U/L (ref 15–41)
Albumin: 3.5 g/dL (ref 3.5–5.0)
Bilirubin, Direct: 0.3 mg/dL (ref 0.1–0.5)
Indirect Bilirubin: 1.4 mg/dL — ABNORMAL HIGH (ref 0.3–0.9)
Total Bilirubin: 1.7 mg/dL — ABNORMAL HIGH (ref 0.3–1.2)
Total Protein: 6.6 g/dL (ref 6.5–8.1)

## 2018-02-02 LAB — BASIC METABOLIC PANEL
ANION GAP: 14 (ref 5–15)
BUN: 20 mg/dL (ref 6–20)
CHLORIDE: 102 mmol/L (ref 101–111)
CO2: 25 mmol/L (ref 22–32)
Calcium: 9.3 mg/dL (ref 8.9–10.3)
Creatinine, Ser: 1.04 mg/dL (ref 0.61–1.24)
GFR calc Af Amer: >60 mL/min (ref >60)
GFR calc non Af Amer: >60 mL/min (ref >60)
GLUCOSE: 158 mg/dL — AB (ref 65–99)
POTASSIUM: 3.1 mmol/L — AB (ref 3.5–5.1)
Sodium: 141 mmol/L (ref 135–145)

## 2018-02-02 LAB — I-STAT CG4 LACTIC ACID, ED
LACTIC ACID, VENOUS: 2.59 mmol/L — AB (ref 0.5–1.9)
LACTIC ACID, VENOUS: 2.2 mmol/L — AB (ref 0.5–1.9)

## 2018-02-02 LAB — I-STAT TROPONIN, ED: Troponin i, poc: 0 ng/mL (ref 0.00–0.08)

## 2018-02-02 LAB — BRAIN NATRIURETIC PEPTIDE: B NATRIURETIC PEPTIDE 5: 226.5 pg/mL — AB (ref 0.0–100.0)

## 2018-02-02 MED ORDER — IOPAMIDOL (ISOVUE-370) INJECTION 76%
100.0000 mL | Freq: Once | INTRAVENOUS | Status: AC | PRN
  Administered 2018-02-02: 100 mL via INTRAVENOUS

## 2018-02-02 MED ORDER — FLUTICASONE PROPIONATE 50 MCG/ACT NA SUSP: 1.0000 | Freq: Every day | NASAL | Status: DC | PRN

## 2018-02-02 MED ORDER — VANCOMYCIN HCL 10 G IV SOLR
1750.0000 mg | Freq: Once | INTRAVENOUS | Status: AC
  Administered 2018-02-02: 1750 mg via INTRAVENOUS
  Filled 2018-02-02: qty 1750

## 2018-02-02 MED ORDER — ONDANSETRON HCL 4 MG/2ML IJ SOLN
4.0000 mg | Freq: Four times a day (QID) | INTRAMUSCULAR | Status: DC | PRN
  Administered 2018-02-05 – 2018-02-06 (×3): 4 mg via INTRAVENOUS
  Filled 2018-02-02 (×3): qty 2

## 2018-02-02 MED ORDER — SODIUM CHLORIDE 0.9 % IV SOLN: 2.0000 g | Freq: Three times a day (TID) | INTRAVENOUS | Status: DC

## 2018-02-02 MED ORDER — ONDANSETRON HCL 4 MG PO TABS: 4.0000 mg | ORAL_TABLET | Freq: Four times a day (QID) | ORAL | Status: DC | PRN

## 2018-02-02 MED ORDER — SODIUM CHLORIDE 0.9 % IV SOLN
2.0000 g | Freq: Once | INTRAVENOUS | Status: AC
  Administered 2018-02-02: 2 g via INTRAVENOUS
  Filled 2018-02-02: qty 2

## 2018-02-02 MED ORDER — IOPAMIDOL (ISOVUE-370) INJECTION 76%
INTRAVENOUS | Status: AC
  Filled 2018-02-02: qty 100

## 2018-02-02 MED ORDER — GUAIFENESIN ER 600 MG PO TB12
600.0000 mg | ORAL_TABLET | Freq: Two times a day (BID) | ORAL | Status: DC
  Administered 2018-02-03 – 2018-02-09 (×12): 600 mg via ORAL
  Filled 2018-02-02 (×12): qty 1

## 2018-02-02 MED ORDER — LEVOFLOXACIN IN D5W 750 MG/150ML IV SOLN
750.0000 mg | Freq: Once | INTRAVENOUS | Status: AC
  Administered 2018-02-02: 750 mg via INTRAVENOUS
  Filled 2018-02-02: qty 150

## 2018-02-02 MED ORDER — VANCOMYCIN HCL IN DEXTROSE 1-5 GM/200ML-% IV SOLN: 1000.0000 mg | Freq: Once | INTRAVENOUS | Status: DC

## 2018-02-02 MED ORDER — SODIUM CHLORIDE 0.9 % IV BOLUS
500.0000 mL | Freq: Once | INTRAVENOUS | Status: AC
  Administered 2018-02-02: 500 mL via INTRAVENOUS

## 2018-02-02 MED ORDER — KETOROLAC TROMETHAMINE 15 MG/ML IJ SOLN: 15.0000 mg | Freq: Four times a day (QID) | INTRAMUSCULAR | Status: DC | PRN

## 2018-02-02 MED ORDER — LORAZEPAM 2 MG/ML IJ SOLN: 0.5000 mg | Freq: Two times a day (BID) | INTRAMUSCULAR | Status: DC | PRN

## 2018-02-02 MED ORDER — VANCOMYCIN HCL 10 G IV SOLR
1250.0000 mg | Freq: Two times a day (BID) | INTRAVENOUS | Status: DC
  Filled 2018-02-02: qty 1250

## 2018-02-02 MED ORDER — ALBUTEROL SULFATE (2.5 MG/3ML) 0.083% IN NEBU: 2.5000 mg | INHALATION_SOLUTION | RESPIRATORY_TRACT | Status: DC | PRN

## 2018-02-02 MED ORDER — ALBUTEROL SULFATE (2.5 MG/3ML) 0.083% IN NEBU
5.0000 mg | INHALATION_SOLUTION | Freq: Once | RESPIRATORY_TRACT | Status: AC
  Administered 2018-02-02: 5 mg via RESPIRATORY_TRACT
  Filled 2018-02-02: qty 6

## 2018-02-02 MED ORDER — LEVOFLOXACIN IN D5W 750 MG/150ML IV SOLN: 750.0000 mg | INTRAVENOUS | Status: DC

## 2018-02-02 NOTE — Consult Note (Signed)
Reason for Consult: vomiting Referring Physician: Wilberth, Tony [name] is an 77 y.o. male.  HPI: 77 yo male 12 days out from appendectomy presents with 2 days of nausea and vomiting and cough. He was sent to ER from Primary care for concern of PE or lung issue. He also noted abdominal swelling, and lower extremity edema. He was eating normally until 2 days ago.  Past Medical History:  Diagnosis Date  . Hypertension    no meds  . 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 Radiation   . Sinus problem     Past Surgical History:  Procedure Laterality Date  . APPENDECTOMY    . LAPAROSCOPIC APPENDECTOMY N/A 01/21/2018   Procedure: APPENDECTOMY LAPAROSCOPIC;  Surgeon: Tony [name], Tony Bruce, Tony [name];  Location: [facility];  Service: General;  Laterality: N/A;  . PROSTATE BIOPSY  02/08/14   gleason 4+5=9, 12/12 cores positive, 54 gm  . RADIOACTIVE SEED IMPLANT N/A 06/29/2014   Procedure: RADIOACTIVE SEED IMPLANT;  Surgeon: Tony Amass, Tony [name];  Location: [facility];  Service: Urology;  Laterality: N/A;    Family History  Problem Relation Age of Onset  . Alzheimer's disease Mother   . Alzheimer's disease Father   . Cancer Father        prostate  . Arthritis Sister   . Cancer Brother        prostate    Social History:  reports that he quit smoking about 5 years ago. His smoking use included cigars. He quit after 60.00 years of use. He has never used smokeless tobacco. He reports that he drinks alcohol. He reports that he does not use drugs.  Allergies:  Allergies  Allergen Reactions  . Penicillins Rash    Has patient had a PCN reaction causing immediate rash, facial/tongue/throat swelling, SOB or lightheadedness with hypotension: Yes Has patient had a PCN reaction causing severe rash involving mucus membranes or skin necrosis: No Has patient had a PCN reaction that required hospitalization: No Has patient had a PCN reaction occurring within the last  10 years: No If all of the above answers are "NO", then may proceed with Cephalosporin use.    Medications: I have reviewed the patient's current medications.  Results for orders placed or performed during the hospital encounter of 02/02/18 (from the past 48 hour(s))  Basic metabolic panel     Status: Abnormal   Collection Time: 02/02/18  5:52 PM  Result Value Ref Range   Sodium 141 135 - 145 mmol/L   Potassium 3.1 (L) 3.5 - 5.1 mmol/L   Chloride 102 101 - 111 mmol/L   CO2 25 22 - 32 mmol/L   Glucose, Bld 158 (H) 65 - 99 mg/dL   BUN 20 6 - 20 mg/dL   Creatinine, Ser 1.04 0.61 - 1.24 mg/dL   Calcium 9.3 8.9 - 10.3 mg/dL   GFR calc non Af Amer >60 >60 mL/min   GFR calc Af Amer >60 >60 mL/min    Comment: (NOTE) The eGFR has been calculated using the CKD EPI equation. This calculation has not been validated in all clinical situations. eGFR's persistently <60 mL/min signify possible Chronic Kidney Disease.    Anion gap 14 5 - 15    Comment: Performed at [facility] [address] [place], [place] [address]  CBC     Status: Abnormal   Collection Time: 02/02/18  5:52 PM  Result Value Ref Range   WBC  12.8 (H) 4.0 - 10.5 K/uL   RBC 4.63 4.22 - 5.81 MIL/uL   Hemoglobin 14.5 13.0 - 17.0 g/dL   HCT 42.7 39.0 - 52.0 %   MCV 92.2 78.0 - 100.0 fL   MCH 31.3 26.0 - 34.0 pg   MCHC 34.0 30.0 - 36.0 g/dL   RDW 12.7 11.5 - 15.5 %   Platelets 415 (H) 150 - 400 K/uL    Comment: Performed at [facility] [address] [place], [place] [address]  I-Stat CG4 Lactic Acid, ED     Status: Abnormal   Collection Time: 02/02/18  6:28 PM  Result Value Ref Range   Lactic Acid, Venous 2.59 (HH) 0.5 - 1.9 mmol/L   Comment NOTIFIED PHYSICIAN   I-stat troponin, ED     Status: None   Collection Time: 02/02/18  6:37 PM  Result Value Ref Range   Troponin i, poc 0.00 0.00 - 0.08 ng/mL   Comment 3            Comment: Due to the release kinetics of cTnI, a negative result within the first  hours of the onset of symptoms does not rule out myocardial infarction with certainty. If myocardial infarction is still suspected, repeat the test at appropriate intervals.   Brain natriuretic peptide     Status: Abnormal   Collection Time: 02/02/18  7:30 PM  Result Value Ref Range   B Natriuretic Peptide 226.5 (H) 0.0 - 100.0 pg/mL    Comment: Performed at [facility] [address], [place], [place] [address]  Blood culture (routine x 2)     Status: None (Preliminary result)   Collection Time: 02/02/18  7:30 PM  Result Value Ref Range   Specimen Description BLOOD LEFT ARM    Special Requests      BOTTLES DRAWN AEROBIC AND ANAEROBIC Blood Culture adequate volume   Culture PENDING    Report Status PENDING   Hepatic function panel     Status: Abnormal   Collection Time: 02/02/18  7:30 PM  Result Value Ref Range   Total Protein 6.6 6.5 - 8.1 g/dL   Albumin 3.5 3.5 - 5.0 g/dL   AST 20 15 - 41 U/L   ALT 15 (L) 17 - 63 U/L   Alkaline Phosphatase 63 38 - 126 U/L   Total Bilirubin 1.7 (H) 0.3 - 1.2 mg/dL   Bilirubin, Direct 0.3 0.1 - 0.5 mg/dL   Indirect Bilirubin 1.4 (H) 0.3 - 0.9 mg/dL    Comment: Performed at [facility], [place] [address] [place],  [address]  I-Stat CG4 Lactic Acid, ED     Status: Abnormal   Collection Time: 02/02/18  7:47 PM  Result Value Ref Range   Lactic Acid, Venous 2.20 (HH) 0.5 - 1.9 mmol/L   Comment NOTIFIED PHYSICIAN     Dg Chest 2 View  Result Date: 02/02/2018 CLINICAL DATA:  Short of breath EXAM: CHEST - 2 VIEW COMPARISON:  05/22/2014 FINDINGS: Hyperinflation with chronic bronchitic changes. No acute consolidation or effusion. Mild cardiomegaly. No pneumothorax. IMPRESSION: 1. Chronic appearing bronchitic changes without acute pulmonary infiltrate 2. Cardiomegaly Electronically Signed   By: Donavan Foil M.D.   On: 02/02/2018 18:23   Ct Angio Chest Pe W And/or Wo Contrast  Result Date: 02/02/2018 CLINICAL DATA:  Shortness of  breath tachypnea and vomiting. Laboratory evidence of sepsis. Recent laparoscopic appendectomy for acute appendicitis on 01/21/2018. EXAM: CT ANGIOGRAPHY CHEST CT ABDOMEN AND PELVIS WITH  CONTRAST TECHNIQUE: Multidetector CT imaging of the chest was performed using the standard protocol during bolus administration of intravenous contrast. Multiplanar CT image reconstructions and MIPs were obtained to evaluate the vascular anatomy. Multidetector CT imaging of the abdomen and pelvis was performed using the standard protocol during bolus administration of intravenous contrast. CONTRAST:  100 mL ISOVUE-370 IOPAMIDOL (ISOVUE-370) INJECTION IV COMPARISON:  CT of the abdomen and pelvis on 01/21/2018 FINDINGS: CTA CHEST FINDINGS Cardiovascular: Satisfactory opacification of the pulmonary arteries to the segmental level. No evidence of pulmonary embolism. Normal heart size. No pericardial effusion. Coronary atherosclerosis evident with calcified plaque in a 3 vessel distribution. The aortic root is mildly dilated and measures 4.2 cm in diameter at the level of the sinuses of Valsalva. There is dilatation of the ascending thoracic aorta which measures 4.7 cm in greatest diameter. The aorta reaches a normal caliber of 3.7 cm at the level of the aortic arch. No evidence to suggest aortic dissection. Visualized proximal great vessels show normally patent origins. Mediastinum/Nodes: The esophagus is mildly distended and contains an air-fluid level in its mid segment and fluid in its distal segment. 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 No evidence of pulmonary consolidation, mass, pneumothorax, edema or pleural fluid. Musculoskeletal: No chest wall abnormality. No acute or significant osseous findings. Review of the MIP images confirms the above findings. CT ABDOMEN and PELVIS FINDINGS Hepatobiliary: No focal liver abnormality is seen. No gallstones, gallbladder wall thickening, or  biliary dilatation. Pancreas: Continued atrophic appearance of the pancreas which is infiltrated with fat. Spleen: Normal in size without focal abnormality. Adrenals/Urinary Tract: Adrenal glands are unremarkable. Kidneys are normal, without renal calculi, focal lesion, or hydronephrosis. Bladder is unremarkable. Stomach/Bowel: There is thickening involving the distal descending portion of the duodenum as well as the third portion of the duodenum. This is associated with a significant large area of inflammation in the retroperitoneum with complex fluid present over an area measuring approximately 8 cm in transverse diameter and up to 13 cm in height. This was not present on the recent CT on 05/10. Differential considerations include inflammatory process of the duodenum such as ulcer disease with perforation and surrounding inflammation and also hemorrhage in this region. This is removed from the head of the pancreas and acute pancreatitis is felt much less likely to be a possibility for findings. There likely is some degree of narrowing of the duodenal lumen at this level as the proximal duodenum and stomach are distended and filled with fluid likely reflecting relative outlet obstruction. No evidence of extraluminal air. No small bowel or colonic dilatation is identified distal to the duodenum. No evidence of postoperative complication at the site of appendectomy. No focal abscess identified. Vascular/Lymphatic: No significant vascular findings are present. No enlarged abdominal or pelvic lymph nodes. Reproductive: Brachytherapy seeds are present in the prostate gland. Other: Bilateral inguinal hernias contain fat, right greater than left. Musculoskeletal: No acute or significant osseous findings. Review of the MIP images confirms the above findings. IMPRESSION: 1. No acute findings in the chest. Incidental note is made of aneurysmal disease of the thoracic aorta with maximal caliber of 4.7 cm at the level of the  ascending thoracic aorta. Ascending thoracic aortic aneurysm. Recommend semi-annual imaging followup by CTA or MRA and referral to cardiothoracic surgery if not already obtained. This recommendation follows 2010 ACCF/AHA/AATS/ACR/ASA/SCA/SCAI/SIR/STS/SVM Guidelines for the Diagnosis and Management of Patients With Thoracic Aortic Disease. Circulation. 2010; 121: O242-P536 2. Coronary atherosclerosis with calcified plaque  in a 3 vessel distribution. 3. New significant inflammatory process versus hemorrhage surrounding the descending duodenum in the retroperitoneum. This is likely causing relative obstruction of the duodenal lumen with resultant significant distention of the stomach and distal esophagus with fluid. The patient may benefit from nasogastric decompression of the stomach. It is somewhat unclear what the etiology of the retroperitoneal findings are. This may be on the basis of an inflammatory process such as duodenitis/ruptured duodenal ulcer versus retroperitoneal hemorrhage surrounding the duodenum. Pancreatitis involving the lateral inferior pancreatic head is felt to be much less likely given location of the dominant retroperitoneal abnormality. Electronically Signed   By: Aletta Edouard M.D.   On: 02/02/2018 21:05   Ct Abdomen Pelvis W Contrast  Result Date: 02/02/2018 CLINICAL DATA:  Shortness of breath tachypnea and vomiting. Laboratory evidence of sepsis. Recent laparoscopic appendectomy for acute appendicitis on 01/21/2018. EXAM: CT ANGIOGRAPHY CHEST CT ABDOMEN AND PELVIS WITH CONTRAST TECHNIQUE: Multidetector CT imaging of the chest was performed using the standard protocol during bolus administration of intravenous contrast. Multiplanar CT image reconstructions and MIPs were obtained to evaluate the vascular anatomy. Multidetector CT imaging of the abdomen and pelvis was performed using the standard protocol during bolus administration of intravenous contrast. CONTRAST:  100 mL ISOVUE-370  IOPAMIDOL (ISOVUE-370) INJECTION IV COMPARISON:  CT of the abdomen and pelvis on 01/21/2018 FINDINGS: CTA CHEST FINDINGS Cardiovascular: Satisfactory opacification of the pulmonary arteries to the segmental level. No evidence of pulmonary embolism. Normal heart size. No pericardial effusion. Coronary atherosclerosis evident with calcified plaque in a 3 vessel distribution. The aortic root is mildly dilated and measures 4.2 cm in diameter at the level of the sinuses of Valsalva. There is dilatation of the ascending thoracic aorta which measures 4.7 cm in greatest diameter. The aorta reaches a normal caliber of 3.7 cm at the level of the aortic arch. No evidence to suggest aortic dissection. Visualized proximal great vessels show normally patent origins. Mediastinum/Nodes: The esophagus is mildly distended and contains an air-fluid level in its mid segment and fluid in its distal segment. 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 No evidence of pulmonary consolidation, mass, pneumothorax, edema or pleural fluid. Musculoskeletal: No chest wall abnormality. No acute or significant osseous findings. Review of the MIP images confirms the above findings. CT ABDOMEN and PELVIS FINDINGS Hepatobiliary: No focal liver abnormality is seen. No gallstones, gallbladder wall thickening, or biliary dilatation. Pancreas: Continued atrophic appearance of the pancreas which is infiltrated with fat. Spleen: Normal in size without focal abnormality. Adrenals/Urinary Tract: Adrenal glands are unremarkable. Kidneys are normal, without renal calculi, focal lesion, or hydronephrosis. Bladder is unremarkable. Stomach/Bowel: There is thickening involving the distal descending portion of the duodenum as well as the third portion of the duodenum. This is associated with a significant large area of inflammation in the retroperitoneum with complex fluid present over an area measuring approximately 8 cm  in transverse diameter and up to 13 cm in height. This was not present on the recent CT on 05/10. Differential considerations include inflammatory process of the duodenum such as ulcer disease with perforation and surrounding inflammation and also hemorrhage in this region. This is removed from the head of the pancreas and acute pancreatitis is felt much less likely to be a possibility for findings. There likely is some degree of narrowing of the duodenal lumen at this level as the proximal duodenum and stomach are distended and filled with fluid likely reflecting relative outlet obstruction. No  evidence of extraluminal air. No small bowel or colonic dilatation is identified distal to the duodenum. No evidence of postoperative complication at the site of appendectomy. No focal abscess identified. Vascular/Lymphatic: No significant vascular findings are present. No enlarged abdominal or pelvic lymph nodes. Reproductive: Brachytherapy seeds are present in the prostate gland. Other: Bilateral inguinal hernias contain fat, right greater than left. Musculoskeletal: No acute or significant osseous findings. Review of the MIP images confirms the above findings. IMPRESSION: 1. No acute findings in the chest. Incidental note is made of aneurysmal disease of the thoracic aorta with maximal caliber of 4.7 cm at the level of the ascending thoracic aorta. Ascending thoracic aortic aneurysm. Recommend semi-annual imaging followup by CTA or MRA and referral to cardiothoracic surgery if not already obtained. This recommendation follows 2010 ACCF/AHA/AATS/ACR/ASA/SCA/SCAI/SIR/STS/SVM Guidelines for the Diagnosis and Management of Patients With Thoracic Aortic Disease. Circulation. 2010; 121: [id number] 2. Coronary atherosclerosis with calcified plaque in a 3 vessel distribution. 3. New significant inflammatory process versus hemorrhage surrounding the descending duodenum in the retroperitoneum. This is likely causing relative  obstruction of the duodenal lumen with resultant significant distention of the stomach and distal esophagus with fluid. The patient may benefit from nasogastric decompression of the stomach. It is somewhat unclear what the etiology of the retroperitoneal findings are. This may be on the basis of an inflammatory process such as duodenitis/ruptured duodenal ulcer versus retroperitoneal hemorrhage surrounding the duodenum. Pancreatitis involving the lateral inferior pancreatic head is felt to be much less likely given location of the dominant retroperitoneal abnormality. Electronically Signed   By: Aletta Edouard M.D.   On: 02/02/2018 21:05    Review of Systems  Constitutional: Negative for chills and fever.  HENT: Negative for hearing loss.   Eyes: Negative for blurred vision and double vision.  Respiratory: Positive for cough and sputum production.   Cardiovascular: Positive for leg swelling. Negative for chest pain and palpitations.  Gastrointestinal: Positive for nausea and vomiting. Negative for abdominal pain.  Genitourinary: Negative for dysuria and urgency.  Musculoskeletal: Negative for myalgias and neck pain.  Skin: Negative for itching and rash.  Neurological: Negative for dizziness, tingling and headaches.  Endo/Heme/Allergies: Does not bruise/bleed easily.  Psychiatric/Behavioral: Negative for depression and suicidal ideas.   Blood pressure (!) 134/97, pulse 89, temperature (!) 97.5 F (36.4 C), temperature source Oral, resp. rate (!) 30, height '5\' 5"'  (1.651 m), weight 90.7 kg (200 lb), SpO2 99 %. Physical Exam  Vitals reviewed. Constitutional: He is oriented to person, place, and time. He appears well-developed and well-nourished.  HENT:  Head: Normocephalic and atraumatic.  Eyes: Pupils are equal, round, and reactive to light. Conjunctivae and EOM are normal.  Neck: Normal range of motion. Neck supple.  Cardiovascular: Normal rate and regular rhythm.  Respiratory: Effort  normal and breath sounds normal.  GI: Soft. He exhibits distension. There is no tenderness.  Musculoskeletal: Normal range of motion.  Neurological: He is alert and oriented to person, place, and time.  Skin: Skin is warm and dry.  Psychiatric: He has a normal mood and affect. His behavior is normal.    Assessment/Plan: 77 yo male with findings of complex fluid collection around the duodenum with obstructive symptoms. After placement of NG tube 1L of fluid was suctioned out and moderate amount vomited onto the floor. Patient has no pain after NG tube placement. I do not think this is a perforation and think it is more likely a periduodenal hematoma. -NG tube decompression -admit to hospital -  rehydrate -may require endoscopy to further evaluate -discussed that these cases usually resolve without surgery but can take a while and may require feeding access  Tony [name] Amere Bricco 02/02/2018, 10:02 PM

## 2018-02-02 NOTE — ED Provider Notes (Signed)
[facility] EMERGENCY DEPARTMENT Provider Note   CSN: [id number] Arrival date & time: 02/02/18  1649     History   Chief Complaint Chief Complaint  Patient presents with  . Emesis  . Shortness of Breath    HPI Tony [name] is a 77 y.o. male sent in by his PCP for evaluation of shortness of breath.  He is status post appendectomy for perforated appendicitis on 01/21/2018.  About 2 days after discharge the patient developed exertional dyspnea, chills, peripheral edema.  His family is at bedside and reports that he has been extremely short of breath for the past 2 days.  He has had a cough of productive of sputum and apparently last night coughed up a large clot and then had blood in his mucus today.  Patient saw his PCP today and was sent over for evaluation and to rule out pulmonary embolus given his recent surgery.  Patient reports subjective/tactile fever yesterday.  He has had bilateral lower extremity edema which is new since his surgery.  Apparently the right leg was larger than the left today.  He is on baby aspirin but not on any other blood thinners.  He had one episode of vomiting dark vomitus here in the emergency department.  He denies melena, abdominal pain.  HPI  Past Medical History:  Diagnosis Date  . Hypertension    no meds  . 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 Radiation   . Sinus problem     Patient Active Problem List   Diagnosis Date Noted  . 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 Acute appendicitis 01/21/2018  . Perforated appendicitis 01/21/2018  . Chronic fatigue 06/01/2017  . Essential hypertension, benign 07/09/2016  . 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 Coronary artery calcification seen on CAT scan 05/15/2016  . Anxiety disorder 04/08/2016  . Insomnia 04/08/2016  . 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 Tobacco use disorder  04/08/2016  . Hypercholesteremia 04/08/2016  . Malignant neoplasm of prostate ([place]) 02/23/2014    Past Surgical History:  Procedure Laterality Date  . APPENDECTOMY    . LAPAROSCOPIC APPENDECTOMY N/A 01/21/2018   Procedure: APPENDECTOMY LAPAROSCOPIC;  Surgeon: Kinsinger, Arta Bruce, MD;  Location: [facility];  Service: General;  Laterality: N/A;  . PROSTATE BIOPSY  02/08/14   gleason 4+5=9, 12/12 cores positive, 54 gm  . RADIOACTIVE SEED IMPLANT N/A 06/29/2014   Procedure: RADIOACTIVE SEED IMPLANT;  Surgeon: Bernestine Amass, MD;  Location: [facility];  Service: Urology;  Laterality: N/A;        Home Medications    Prior to Admission medications   Medication Sig Start Date End Date Taking? Authorizing Provider  acetaminophen (TYLENOL) 325 MG tablet Take 2 tablets (650 mg total) by mouth every 6 (six) hours as needed for mild pain (or Fever >/= 101). 01/23/18  Yes Simaan, Darci Current, PA-C  amLODipine (NORVASC) 2.5 MG tablet Take 1 tablet (2.5 mg total) by mouth daily. 10/19/17  Yes [name], [name], MD  aspirin EC 81 MG tablet Take 81 mg by mouth daily.   Yes [provider]  fluticasone (FLONASE) 50 MCG/ACT nasal spray Place 1 spray into both nostrils daily as needed for allergies or rhinitis.   Yes [provider]  LORazepam (ATIVAN) 1 MG tablet TAKE 1/2 TO 1 TABLET DAILY AS NEEDED Patient taking differently: TAKE 1/2 TABLET BY MOUTH TWICE DAILY 11/08/17  Yes [name], [name], MD  Multiple Vitamin (MULTIVITAMIN WITH MINERALS) TABS tablet Take 1 tablet by mouth daily.   Yes [provider]  naproxen sodium (ALEVE) 220 MG tablet Take 220 mg by mouth 2 (two) times daily as needed (pain/headache).   Yes [provider]  polyethylene glycol (MIRALAX / GLYCOLAX) packet Take 17 g by mouth daily as needed for severe constipation. 01/24/18  Yes Meuth, Brooke A, PA-C  predniSONE (DELTASONE) 5 MG tablet Take 1 tablet (5 mg total) by mouth daily with  breakfast. 11/16/17  Yes Shadad, Mathis Dad, MD  psyllium (METAMUCIL SMOOTH TEXTURE) 28 % packet Take 1 packet by mouth 2 (two) times daily. Patient taking differently: Take 1 packet by mouth 2 (two) times daily as needed (constipation).  01/24/18 01/24/19 Yes Meuth, Brooke A, PA-C  traMADol (ULTRAM) 50 MG tablet Take 1 tablet (50 mg total) by mouth every 6 (six) hours as needed (mild pain). 01/24/18  Yes Meuth, Brooke A, PA-C  traZODone (DESYREL) 50 MG tablet Take 0.5-1 tablets (25-50 mg total) by mouth at bedtime as needed for sleep. Patient taking differently: Take 50 mg by mouth at bedtime.  10/19/17  Yes [name], [name], MD  ZYTIGA 250 MG tablet TAKE 4 TABLETS (1000 MG) BY MOUTH DAILY. TAKE ON AN EMPTY STOMACH 1 HOUR BEFORE OR 2 HOURS AFTER A MEAL 01/31/18  Yes Wyatt Portela, MD    Family History Family History  Problem Relation Age of Onset  . Alzheimer's disease Mother   . Alzheimer's disease Father   . Cancer Father        prostate  . Arthritis Sister   . Cancer Brother        prostate    Social History Social History   Tobacco Use  . Smoking status: Former Smoker    Years: 60.00    Types: Cigars    Last attempt to quit: 09/14/2012    Years since quitting: 5.3  . Smokeless tobacco: Never Used  . Tobacco comment: little cigars; the small ones; quit smoking 77 yo   Substance Use Topics  . Alcohol use: Yes    Comment: 1-2 beer daily  . Drug use: No     Allergies   Penicillins   Review of Systems Review of Systems  Ten systems reviewed and are negative for acute change, except as noted in the HPI.   Physical Exam Updated Vital Signs BP 109/80   Pulse 83   Temp 99.5 F (37.5 C) (Axillary)   Resp 19   Ht 5\' 5"  (1.651 m)   Wt 90.7 kg (200 lb)   SpO2 94%   BMI 33.28 kg/m   Physical Exam  Constitutional: He appears well-developed and well-nourished. He appears ill.  HENT:  Head: Normocephalic and atraumatic.  Eyes: Pupils are equal, round, and reactive to light.  EOM are normal.  Neck: Normal range of motion. Neck supple.  Cardiovascular:  Tachycardic  Pulmonary/Chest: No stridor. Tachypnea noted. No respiratory distress. He has no wheezes. He exhibits no tenderness.  Patient tachypneic with labored breathing into the 30s.  This is not congruent with the documented respiratory rate of 18 however was able to count respirations.  He is extremely tachypneic.  Abdominal: He exhibits distension. There is tenderness.  Doing across the abdomen and at the surgical incision sites.  No apparent external signs of infection.  Tenderness over the surgical site in the right lower quadrant  Musculoskeletal:       Right  lower leg: He exhibits edema.       Left lower leg: He exhibits edema.  Skin: Skin is warm and dry. Capillary refill takes less than 2 seconds. No rash noted.  Bilateral 2+ pitting edema in the lower extremities, right minimally larger than left normal peripheral pulses  Nursing note and vitals reviewed.    ED Treatments / Results  Labs (all labs ordered are listed, but only abnormal results are displayed) Labs Reviewed  BASIC METABOLIC PANEL - Abnormal; Notable for the following components:      Result Value   Potassium 3.1 (*)    Glucose, Bld 158 (*)    All other components within normal limits  CBC - Abnormal; Notable for the following components:   WBC 12.8 (*)    Platelets 415 (*)    All other components within normal limits  BRAIN NATRIURETIC PEPTIDE - Abnormal; Notable for the following components:   B Natriuretic Peptide 226.5 (*)    All other components within normal limits  HEPATIC FUNCTION PANEL - Abnormal; Notable for the following components:   ALT 15 (*)    Total Bilirubin 1.7 (*)    Indirect Bilirubin 1.4 (*)    All other components within normal limits  URINALYSIS, ROUTINE W REFLEX MICROSCOPIC - Abnormal; Notable for the following components:   Specific Gravity, Urine >1.046 (*)    Ketones, ur 20 (*)    Protein, ur 30  (*)    All other components within normal limits  I-STAT CG4 LACTIC ACID, ED - Abnormal; Notable for the following components:   Lactic Acid, Venous 2.59 (*)    All other components within normal limits  I-STAT CG4 LACTIC ACID, ED - Abnormal; Notable for the following components:   Lactic Acid, Venous 2.20 (*)    All other components within normal limits  CULTURE, BLOOD (ROUTINE X 2)  CULTURE, BLOOD (ROUTINE X 2)  BASIC METABOLIC PANEL  CBC  I-STAT TROPONIN, ED    EKG EKG Interpretation  Date/Time:  Wednesday Feb 02 2018 17:47:05 EDT Ventricular Rate:  101 PR Interval:  186 QRS Duration: 140 QT Interval:  446 QTC Calculation: 578 R Axis:   -65 Text Interpretation:  Sinus tachycardia Right bundle branch block Left anterior fascicular block  Bifascicular block  T wave abnormality, consider inferolateral ischemia Abnormal ECG Since last tracing Left bundle branch block is new Confirmed by Daleen Bo [phone]) on 02/02/2018 7:02:40 PM   Radiology Dg Chest 2 View  Result Date: 02/02/2018 CLINICAL DATA:  Short of breath EXAM: CHEST - 2 VIEW COMPARISON:  05/22/2014 FINDINGS: Hyperinflation with chronic bronchitic changes. No acute consolidation or effusion. Mild cardiomegaly. No pneumothorax. IMPRESSION: 1. Chronic appearing bronchitic changes without acute pulmonary infiltrate 2. Cardiomegaly Electronically Signed   By: Donavan Foil M.D.   On: 02/02/2018 18:23   Ct Angio Chest Pe W And/or Wo Contrast  Result Date: 02/02/2018 CLINICAL DATA:  Shortness of breath tachypnea and vomiting. Laboratory evidence of sepsis. Recent laparoscopic appendectomy for acute appendicitis on 01/21/2018. EXAM: CT ANGIOGRAPHY CHEST CT ABDOMEN AND PELVIS WITH CONTRAST TECHNIQUE: Multidetector CT imaging of the chest was performed using the standard protocol during bolus administration of intravenous contrast. Multiplanar CT image reconstructions and MIPs were obtained to evaluate the vascular anatomy.  Multidetector CT imaging of the abdomen and pelvis was performed using the standard protocol during bolus administration of intravenous contrast. CONTRAST:  100 mL ISOVUE-370 IOPAMIDOL (ISOVUE-370) INJECTION IV COMPARISON:  CT of the abdomen and pelvis on  01/21/2018 FINDINGS: CTA CHEST FINDINGS Cardiovascular: Satisfactory opacification of the pulmonary arteries to the segmental level. No evidence of pulmonary embolism. Normal heart size. No pericardial effusion. Coronary atherosclerosis evident with calcified plaque in a 3 vessel distribution. The aortic root is mildly dilated and measures 4.2 cm in diameter at the level of the sinuses of Valsalva. There is dilatation of the ascending thoracic aorta which measures 4.7 cm in greatest diameter. The aorta reaches a normal caliber of 3.7 cm at the level of the aortic arch. No evidence to suggest aortic dissection. Visualized proximal great vessels show normally patent origins. Mediastinum/Nodes: The esophagus is mildly distended and contains an air-fluid level in its mid segment and fluid in its distal segment. 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 No evidence of pulmonary consolidation, mass, pneumothorax, edema or pleural fluid. Musculoskeletal: No chest wall abnormality. No acute or significant osseous findings. Review of the MIP images confirms the above findings. CT ABDOMEN and PELVIS FINDINGS Hepatobiliary: No focal liver abnormality is seen. No gallstones, gallbladder wall thickening, or biliary dilatation. Pancreas: Continued atrophic appearance of the pancreas which is infiltrated with fat. Spleen: Normal in size without focal abnormality. Adrenals/Urinary Tract: Adrenal glands are unremarkable. Kidneys are normal, without renal calculi, focal lesion, or hydronephrosis. Bladder is unremarkable. Stomach/Bowel: There is thickening involving the distal descending portion of the duodenum as well as the third  portion of the duodenum. This is associated with a significant large area of inflammation in the retroperitoneum with complex fluid present over an area measuring approximately 8 cm in transverse diameter and up to 13 cm in height. This was not present on the recent CT on 05/10. Differential considerations include inflammatory process of the duodenum such as ulcer disease with perforation and surrounding inflammation and also hemorrhage in this region. This is removed from the head of the pancreas and acute pancreatitis is felt much less likely to be a possibility for findings. There likely is some degree of narrowing of the duodenal lumen at this level as the proximal duodenum and stomach are distended and filled with fluid likely reflecting relative outlet obstruction. No evidence of extraluminal air. No small bowel or colonic dilatation is identified distal to the duodenum. No evidence of postoperative complication at the site of appendectomy. No focal abscess identified. Vascular/Lymphatic: No significant vascular findings are present. No enlarged abdominal or pelvic lymph nodes. Reproductive: Brachytherapy seeds are present in the prostate gland. Other: Bilateral inguinal hernias contain fat, right greater than left. Musculoskeletal: No acute or significant osseous findings. Review of the MIP images confirms the above findings. IMPRESSION: 1. No acute findings in the chest. Incidental note is made of aneurysmal disease of the thoracic aorta with maximal caliber of 4.7 cm at the level of the ascending thoracic aorta. Ascending thoracic aortic aneurysm. Recommend semi-annual imaging followup by CTA or MRA and referral to cardiothoracic surgery if not already obtained. This recommendation follows 2010 ACCF/AHA/AATS/ACR/ASA/SCA/SCAI/SIR/STS/SVM Guidelines for the Diagnosis and Management of Patients With Thoracic Aortic Disease. Circulation. 2010; 121: V425-Z563 2. Coronary atherosclerosis with calcified plaque in  a 3 vessel distribution. 3. New significant inflammatory process versus hemorrhage surrounding the descending duodenum in the retroperitoneum. This is likely causing relative obstruction of the duodenal lumen with resultant significant distention of the stomach and distal esophagus with fluid. The patient may benefit from nasogastric decompression of the stomach. It is somewhat unclear what the etiology of the retroperitoneal findings are. This may be on the basis of an  inflammatory process such as duodenitis/ruptured duodenal ulcer versus retroperitoneal hemorrhage surrounding the duodenum. Pancreatitis involving the lateral inferior pancreatic head is felt to be much less likely given location of the dominant retroperitoneal abnormality. Electronically Signed   By: Aletta Edouard M.D.   On: 02/02/2018 21:05   Ct Abdomen Pelvis W Contrast  Result Date: 02/02/2018 CLINICAL DATA:  Shortness of breath tachypnea and vomiting. Laboratory evidence of sepsis. Recent laparoscopic appendectomy for acute appendicitis on 01/21/2018. EXAM: CT ANGIOGRAPHY CHEST CT ABDOMEN AND PELVIS WITH CONTRAST TECHNIQUE: Multidetector CT imaging of the chest was performed using the standard protocol during bolus administration of intravenous contrast. Multiplanar CT image reconstructions and MIPs were obtained to evaluate the vascular anatomy. Multidetector CT imaging of the abdomen and pelvis was performed using the standard protocol during bolus administration of intravenous contrast. CONTRAST:  100 mL ISOVUE-370 IOPAMIDOL (ISOVUE-370) INJECTION IV COMPARISON:  CT of the abdomen and pelvis on 01/21/2018 FINDINGS: CTA CHEST FINDINGS Cardiovascular: Satisfactory opacification of the pulmonary arteries to the segmental level. No evidence of pulmonary embolism. Normal heart size. No pericardial effusion. Coronary atherosclerosis evident with calcified plaque in a 3 vessel distribution. The aortic root is mildly dilated and measures 4.2 cm  in diameter at the level of the sinuses of Valsalva. There is dilatation of the ascending thoracic aorta which measures 4.7 cm in greatest diameter. The aorta reaches a normal caliber of 3.7 cm at the level of the aortic arch. No evidence to suggest aortic dissection. Visualized proximal great vessels show normally patent origins. Mediastinum/Nodes: The esophagus is mildly distended and contains an air-fluid level in its mid segment and fluid in its distal segment. 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 No evidence of pulmonary consolidation, mass, pneumothorax, edema or pleural fluid. Musculoskeletal: No chest wall abnormality. No acute or significant osseous findings. Review of the MIP images confirms the above findings. CT ABDOMEN and PELVIS FINDINGS Hepatobiliary: No focal liver abnormality is seen. No gallstones, gallbladder wall thickening, or biliary dilatation. Pancreas: Continued atrophic appearance of the pancreas which is infiltrated with fat. Spleen: Normal in size without focal abnormality. Adrenals/Urinary Tract: Adrenal glands are unremarkable. Kidneys are normal, without renal calculi, focal lesion, or hydronephrosis. Bladder is unremarkable. Stomach/Bowel: There is thickening involving the distal descending portion of the duodenum as well as the third portion of the duodenum. This is associated with a significant large area of inflammation in the retroperitoneum with complex fluid present over an area measuring approximately 8 cm in transverse diameter and up to 13 cm in height. This was not present on the recent CT on 05/10. Differential considerations include inflammatory process of the duodenum such as ulcer disease with perforation and surrounding inflammation and also hemorrhage in this region. This is removed from the head of the pancreas and acute pancreatitis is felt much less likely to be a possibility for findings. There likely is some  degree of narrowing of the duodenal lumen at this level as the proximal duodenum and stomach are distended and filled with fluid likely reflecting relative outlet obstruction. No evidence of extraluminal air. No small bowel or colonic dilatation is identified distal to the duodenum. No evidence of postoperative complication at the site of appendectomy. No focal abscess identified. Vascular/Lymphatic: No significant vascular findings are present. No enlarged abdominal or pelvic lymph nodes. Reproductive: Brachytherapy seeds are present in the prostate gland. Other: Bilateral inguinal hernias contain fat, right greater than left. Musculoskeletal: No acute or significant osseous findings. Review  of the MIP images confirms the above findings. IMPRESSION: 1. No acute findings in the chest. Incidental note is made of aneurysmal disease of the thoracic aorta with maximal caliber of 4.7 cm at the level of the ascending thoracic aorta. Ascending thoracic aortic aneurysm. Recommend semi-annual imaging followup by CTA or MRA and referral to cardiothoracic surgery if not already obtained. This recommendation follows 2010 ACCF/AHA/AATS/ACR/ASA/SCA/SCAI/SIR/STS/SVM Guidelines for the Diagnosis and Management of Patients With Thoracic Aortic Disease. Circulation. 2010; 121: W098-J191 2. Coronary atherosclerosis with calcified plaque in a 3 vessel distribution. 3. New significant inflammatory process versus hemorrhage surrounding the descending duodenum in the retroperitoneum. This is likely causing relative obstruction of the duodenal lumen with resultant significant distention of the stomach and distal esophagus with fluid. The patient may benefit from nasogastric decompression of the stomach. It is somewhat unclear what the etiology of the retroperitoneal findings are. This may be on the basis of an inflammatory process such as duodenitis/ruptured duodenal ulcer versus retroperitoneal hemorrhage surrounding the duodenum.  Pancreatitis involving the lateral inferior pancreatic head is felt to be much less likely given location of the dominant retroperitoneal abnormality. Electronically Signed   By: Aletta Edouard M.D.   On: 02/02/2018 21:05    Procedures .Critical Care Performed by: Margarita Mail, PA-C Authorized by: Margarita Mail, PA-C   Critical care provider statement:    Critical care time (minutes):  70   Critical care time was exclusive of:  Separately billable procedures and treating other patients   Critical care was necessary to treat or prevent imminent or life-threatening deterioration of the following conditions:  Dehydration   Critical care was time spent personally by me on the following activities:  Development of treatment plan with patient or surrogate, discussions with consultants, evaluation of patient's response to treatment, examination of patient, gastric intubation, ordering and performing treatments and interventions, ordering and review of laboratory studies, ordering and review of radiographic studies, pulse oximetry, re-evaluation of patient's condition, review of old charts, obtaining history from patient or surrogate and interpretation of cardiac output measurements   (including critical care time)  Medications Ordered in ED Medications  LORazepam (ATIVAN) injection 0.5 mg (has no administration in time range)  fluticasone (FLONASE) 50 MCG/ACT nasal spray 1 spray (has no administration in time range)  ondansetron (ZOFRAN) tablet 4 mg (has no administration in time range)    Or  ondansetron (ZOFRAN) injection 4 mg (has no administration in time range)  ketorolac (TORADOL) 15 MG/ML injection 15 mg (has no administration in time range)  albuterol (PROVENTIL) (2.5 MG/3ML) 0.083% nebulizer solution 2.5 mg (has no administration in time range)  guaiFENesin (MUCINEX) 12 hr tablet 600 mg (600 mg Oral Not Given 02/03/18 0019)  albuterol (PROVENTIL) (2.5 MG/3ML) 0.083% nebulizer  solution 5 mg (5 mg Nebulization Given 02/02/18 1934)  levofloxacin (LEVAQUIN) IVPB 750 mg (0 mg Intravenous Stopped 02/02/18 2059)  aztreonam (AZACTAM) 2 g in sodium chloride 0.9 % 100 mL IVPB (0 g Intravenous Stopped 02/02/18 2136)  vancomycin (VANCOCIN) 1,750 mg in sodium chloride 0.9 % 500 mL IVPB (0 mg Intravenous Stopped 02/02/18 2137)  iopamidol (ISOVUE-370) 76 % injection 100 mL (100 mLs Intravenous Contrast Given 02/02/18 2026)  sodium chloride 0.9 % bolus 500 mL (0 mLs Intravenous Stopped 02/02/18 2137)     Initial Impression / Assessment and Plan / ED Course  I have reviewed the triage vital signs and the nursing notes.  Pertinent labs & imaging results that were available during my care of the  patient were reviewed by me and considered in my medical decision making (see chart for details).  Clinical Course as of Feb 03 25  Wed Feb 02, 2018  4196 Per radiology, no acute changes.  By my review, questionable right lower lobe infiltrate.  DG Chest 2 View [EW]  2229 WBC(!): 12.8 [AH]  1859 Lactic Acid, Venous(!!): 2.59 [AH]  1901 Patient fits sepsis criteria.  Unsure of current source.  Chest x-ray is negative.  I have ordered fluids and broad spectrum ABX   [AH]  2355 I spoke with Dr. Penelope Coop on call for GI. The patient will have a consult with GI tomorrow.   [AH]  Thu Feb 03, 2018  0015 Patient with significantly abnormal findings on his CT scan.The scan shows significant  inflammation of the duodenum causing outlet obstruction.  I discussed this with Dr. Kathlene Cote.  And then discussed the case with Dr. Kieth Brightly who asks for medical admission.  NG tube placed patient put out 2 L of black vomitus.   [AH]  0021 Patient admitted by Dr. Evangeline Gula, feels significantly improved after NG tube decompression of the stomach.   [AH]    Clinical Course User Index [AH] Margarita Mail, PA-C [EW] Daleen Bo, MD    Patient with gastric outlet obstruction.  Initially patient appeared to be  septic however I think this is very likely significant dehydration versus bleeding.  The differential includes duodenal hematoma, perforated ulcer is less likely given no free air in the abdomen.  NG tube has significantly decreased the patient's nausea and he put out 2 L of hematemesis.  EKG reviewed and no apparent ischemic changes. patient's hemoglobin appears stable.  He is given fluid rehydration and has been seen by surgery and hospitalist.  Patient will be admitted and have a GI consult tomorrow as well.  Final Clinical Impressions(s) / ED Diagnoses   Final diagnoses:  Gastric outlet obstruction    ED Discharge Orders    None       Margarita Mail, PA-C 02/03/18 0028    Daleen Bo, MD 02/04/18 [phone]

## 2018-02-02 NOTE — Progress Notes (Signed)
Subjective:    Patient ID: Tony [name], male    DOB: 04-30-1941, 77 y.o.   MRN: [id number]  HPI  77 year old male who  has a past medical history of Hypertension, Prostate cancer ([place]) (02/08/14), Radiation, and Sinus problem. He presents to the office today for a new issue.   Reports that he had surgery on 01/21/2018 for perforated appendicitis. He completed a 5 day course of cipro and flagyl   Report that over the last 2-3 days he has developed shortness of breath with exertion, productive cough, episodes of chills, diaphoresis and feeling ill.  His wife, who is with him at this visit reports that he is not at baseline.   Denies fevers, chest pain, abdominal pain, nausea, vomiting, or diarrhea  Review of Systems  See HPI   Past Medical History:  Diagnosis Date  . Hypertension    no meds  . 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 Radiation   . Sinus problem     Social History   Socioeconomic History  . Marital status: Married    Spouse name: Not on file  . Number of children: Not on file  . Years of education: Not on file  . Highest education level: Not on file  Occupational History  . Not on file  Social Needs  . Financial resource strain: Not on file  . Food insecurity:    Worry: Not on file    Inability: Not on file  . Transportation needs:    Medical: Not on file    Non-medical: Not on file  Tobacco Use  . Smoking status: Former Smoker    Years: 60.00    Types: Cigars    Last attempt to quit: 09/14/2012    Years since quitting: 5.3  . Smokeless tobacco: Never Used  . Tobacco comment: little cigars; the small ones; quit smoking 77 yo   Substance and Sexual Activity  . Alcohol use: Yes    Comment: 1-2 beer daily  . Drug use: No  . Sexual activity: Not Currently  Lifestyle  . Physical activity:    Days per week: Not on file    Minutes per session: Not on file  . Stress: Not on file  Relationships  . Social connections:   Talks on phone: Not on file    Gets together: Not on file    Attends religious service: Not on file    Active member of club or organization: Not on file    Attends meetings of clubs or organizations: Not on file    Relationship status: Not on file  . Intimate partner violence:    Fear of current or ex partner: Not on file    Emotionally abused: Not on file    Physically abused: Not on file    Forced sexual activity: Not on file  Other Topics Concern  . Not on file  Social History Narrative  . Not on file    Past Surgical History:  Procedure Laterality Date  . LAPAROSCOPIC APPENDECTOMY N/A 01/21/2018   Procedure: APPENDECTOMY LAPAROSCOPIC;  Surgeon: Kinsinger, Arta Bruce, MD;  Location: [facility];  Service: General;  Laterality: N/A;  . PROSTATE BIOPSY  02/08/14   gleason 4+5=9, 12/12 cores positive, 54 gm  . RADIOACTIVE SEED IMPLANT N/A 06/29/2014   Procedure: RADIOACTIVE SEED IMPLANT;  Surgeon: Bernestine Amass, MD;  Location: [facility];  Service: Urology;  Laterality: N/A;    Family  History  Problem Relation Age of Onset  . Alzheimer's disease Mother   . Alzheimer's disease Father   . Cancer Father        prostate  . Arthritis Sister   . Cancer Brother        prostate    Allergies  Allergen Reactions  . Penicillins Rash    Has patient had a PCN reaction causing immediate rash, facial/tongue/throat swelling, SOB or lightheadedness with hypotension: Yes Has patient had a PCN reaction causing severe rash involving mucus membranes or skin necrosis: No Has patient had a PCN reaction that required hospitalization: No Has patient had a PCN reaction occurring within the last 10 years: No If all of the above answers are "NO", then may proceed with Cephalosporin use.    Current Outpatient Medications on File Prior to Visit  Medication Sig Dispense Refill  . acetaminophen (TYLENOL) 325 MG tablet Take 2 tablets (650 mg total) by mouth every 6 (six) hours as needed  for mild pain (or Fever >/= 101).    [name] amLODipine (NORVASC) 2.5 MG tablet Take 1 tablet (2.5 mg total) by mouth daily. 90 tablet 2  . fluticasone (FLONASE) 50 MCG/ACT nasal spray Place 1 spray into both nostrils daily as needed for allergies or rhinitis.    [name] LORazepam (ATIVAN) 1 MG tablet TAKE 1/2 TO 1 TABLET DAILY AS NEEDED 30 tablet 3  . LORazepam (ATIVAN) 1 MG tablet TAKE 1/2 TO 1 TABLET DAILY AS NEEDED (Patient taking differently: TAKE 1/2 TABLET BY MOUTH TWICE DAILY) 30 tablet 2  . Multiple Vitamin (MULTIVITAMIN WITH MINERALS) TABS tablet Take 1 tablet by mouth daily.    . naproxen sodium (ALEVE) 220 MG tablet Take 220 mg by mouth 2 (two) times daily as needed (pain/headache).    . polyethylene glycol (MIRALAX / GLYCOLAX) packet Take 17 g by mouth daily as needed for severe constipation. 14 each 0  . predniSONE (DELTASONE) 5 MG tablet Take 1 tablet (5 mg total) by mouth daily with breakfast. 90 tablet 0  . psyllium (METAMUCIL SMOOTH TEXTURE) 28 % packet Take 1 packet by mouth 2 (two) times daily. 60 tablet 11  . traMADol (ULTRAM) 50 MG tablet Take 1 tablet (50 mg total) by mouth every 6 (six) hours as needed (mild pain). 15 tablet 0  . traZODone (DESYREL) 50 MG tablet Take 0.5-1 tablets (25-50 mg total) by mouth at bedtime as needed for sleep. (Patient taking differently: Take 50 mg by mouth at bedtime. ) 90 tablet 2  . ZYTIGA 250 MG tablet TAKE 4 TABLETS (1000 MG) BY MOUTH DAILY. TAKE ON AN EMPTY STOMACH 1 HOUR BEFORE OR 2 HOURS AFTER A MEAL 120 tablet 0   No current facility-administered medications on file prior to visit.     BP 130/80   Pulse 80   Temp (!) 97.5 F (36.4 C) (Oral)   Wt 200 lb (90.7 kg)   SpO2 96%   BMI 29.53 kg/m        Objective:   Physical Exam  Constitutional: He is oriented to person, place, and time. He appears well-developed and well-nourished. He appears ill. He appears distressed.  Cardiovascular: Normal rate, regular rhythm, normal heart sounds and  intact distal pulses. Exam reveals no gallop and no friction rub.  No murmur heard. Shortness of breath worse with exertion but continues to be short of breath with rest  Pulmonary/Chest: Breath sounds normal. Tachypnea noted. He has no wheezes. He has no rhonchi. He has no  rales. He exhibits no tenderness.  Abdominal: Bowel sounds are normal. He exhibits distension. He exhibits no mass. There is no tenderness. There is no rebound and no guarding.  Surgical scar bruising noted to abdomen below umbilicus  Neurological: He is alert and oriented to person, place, and time.  Skin: Skin is warm and dry. Capillary refill takes less than 2 seconds. He is not diaphoretic.  Psychiatric: He has a normal mood and affect. His behavior is normal. Judgment and thought content normal.  Nursing note and vitals reviewed.     Assessment & Plan:  Due to symptoms and recent surgery was concern for pulmonary embolism vs infectious process.  Advised patient to follow-up emergency room for further evaluation. He and his wife are agreeable to this plan and they refused transfer port via EMS and will take private vehicle.  They will be going to [facility], we will call charge nurse and let them know the patient is on his way.  EKG in the office with undetermined rhythm, frequent multiform ectopic ventricular beats.  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  Rate 95  Dorothyann Peng, NP

## 2018-02-02 NOTE — Progress Notes (Signed)
Pharmacy Antibiotic Note  Tony [name] is a 77 y.o. male admitted on 02/02/2018 with sepsis.  Pharmacy has been consulted for aztreonam, levaquin and vancomycin dosing. WBC 2.98, LA 2.59, SCr 1.04, CrCl ~ 60-65 mL/min   Plan: -Aztreonam 2 gm IV Q 8 hours -Levaquin 750 mg IV Q 24 hours -Vancomycin 1750 mg IV once, then vancomycin 1250 mg IV Q 12 hours -Monitor CBC, renal fx, cultures and clinical progress -VT at SS  Height: 5\' 9"  (175.3 cm) Weight: 200 lb (90.7 kg) IBW/kg (Calculated) : 70.7  Temp (24hrs), Avg:97.5 F (36.4 C), Min:97.5 F (36.4 C), Max:97.5 F (36.4 C)  Recent Labs  Lab 02/02/18 1752 02/02/18 1828  WBC 12.8*  --   CREATININE 1.04  --   LATICACIDVEN  --  2.59*    Estimated Creatinine Clearance: 68.3 mL/min (by C-G formula based on SCr of 1.04 mg/dL).    Allergies  Allergen Reactions  . Penicillins Rash    Has patient had a PCN reaction causing immediate rash, facial/tongue/throat swelling, SOB or lightheadedness with hypotension: Yes Has patient had a PCN reaction causing severe rash involving mucus membranes or skin necrosis: No Has patient had a PCN reaction that required hospitalization: No Has patient had a PCN reaction occurring within the last 10 years: No If all of the above answers are "NO", then may proceed with Cephalosporin use.    Antimicrobials this admission: Vanc 5/22 >>  Aztreonam 5/22 >>  Levaquin 5/22 >>   Dose adjustments this admission:  Microbiology results: 5/22 BCx:    Thank you for allowing pharmacy to be a part of this patient's care.  Albertina Parr, PharmD., BCPS Clinical Pharmacist Clinical phone for 02/02/18 until 11pm: [phone] If after 11pm, please call main pharmacy at: [phone]

## 2018-02-02 NOTE — ED Provider Notes (Signed)
  Face-to-face evaluation   History: He is here for evaluation of shortness of breath, leg edema, cough, and mucus production.  Mucus is "light today,", and once he "coughed up a clot.  He has had decreased appetite.  He had an appendectomy, 6 days ago.  He is not on anticoagulants.  Physical exam: Alert, calm, cooperative..  Lungs were several days with a few scattered rhonchi but no wheezing.  Heart tachycardic without murmur.  Abdomen somewhat distended.  Healing abdominal laparoscopic wounds.  Mild associated ecchymotic areas with the surgical wounds.  Wounds are not draining or bleeding.  Medical screening examination/treatment/procedure(s) were conducted as a shared visit with non-physician practitioner(s) and myself.  I personally evaluated the patient during the encounter    Daleen Bo, MD 02/04/18 1428

## 2018-02-02 NOTE — ED Triage Notes (Signed)
Pt endorses "spitting up phlem and shob" x 2 days. Pt had episode of vomiting in triage room. Pt sent by pcp for PE rule out. Tachypneic in triage.

## 2018-02-02 NOTE — ED Notes (Signed)
Patient transported to CT SCAN . 

## 2018-02-03 ENCOUNTER — Inpatient Hospital Stay (HOSPITAL_COMMUNITY): Payer: Medicare Other | Admitting: Anesthesiology

## 2018-02-03 ENCOUNTER — Encounter (HOSPITAL_COMMUNITY)
Admission: EM | Disposition: A | Discharge status: Home / Self Care | Payer: Self-pay | Source: Ambulatory Visit | Attending: Internal Medicine

## 2018-02-03 ENCOUNTER — Encounter (HOSPITAL_COMMUNITY): Payer: Self-pay | Admitting: *Deleted

## 2018-02-03 DIAGNOSIS — I712 Thoracic aortic aneurysm, without rupture, unspecified: Secondary | ICD-10-CM | POA: Diagnosis present

## 2018-02-03 DIAGNOSIS — K8689 Other specified diseases of pancreas: Secondary | ICD-10-CM | POA: Diagnosis present

## 2018-02-03 DIAGNOSIS — J439 Emphysema, unspecified: Secondary | ICD-10-CM | POA: Diagnosis present

## 2018-02-03 DIAGNOSIS — K311 Adult hypertrophic pyloric stenosis: Secondary | ICD-10-CM

## 2018-02-03 DIAGNOSIS — J449 Chronic obstructive pulmonary disease, unspecified: Secondary | ICD-10-CM | POA: Diagnosis present

## 2018-02-03 HISTORY — DX: Adult hypertrophic pyloric stenosis: K31.1

## 2018-02-03 HISTORY — PX: ESOPHAGOGASTRODUODENOSCOPY (EGD) WITH PROPOFOL: SHX5813

## 2018-02-03 LAB — BASIC METABOLIC PANEL
Anion gap: 11 (ref 5–15)
BUN: 19 mg/dL (ref 6–20)
CHLORIDE: 106 mmol/L (ref 101–111)
CO2: 27 mmol/L (ref 22–32)
CREATININE: 0.91 mg/dL (ref 0.61–1.24)
Calcium: 8.3 mg/dL — ABNORMAL LOW (ref 8.9–10.3)
Glucose, Bld: 122 mg/dL — ABNORMAL HIGH (ref 65–99)
POTASSIUM: 2.7 mmol/L — AB (ref 3.5–5.1)
SODIUM: 144 mmol/L (ref 135–145)

## 2018-02-03 LAB — CBC
HEMATOCRIT: 36.1 % — AB (ref 39.0–52.0)
HEMOGLOBIN: 12.2 g/dL — AB (ref 13.0–17.0)
MCH: 31.3 pg (ref 26.0–34.0)
MCHC: 33.8 g/dL (ref 30.0–36.0)
MCV: 92.6 fL (ref 78.0–100.0)
Platelets: 310 10*3/uL (ref 150–400)
RBC: 3.9 MIL/uL — AB (ref 4.22–5.81)
RDW: 13 % (ref 11.5–15.5)
WBC: 10.8 10*3/uL — ABNORMAL HIGH (ref 4.0–10.5)

## 2018-02-03 LAB — LACTIC ACID, PLASMA: Lactic Acid, Venous: 1.3 mmol/L (ref 0.5–1.9)

## 2018-02-03 SURGERY — ESOPHAGOGASTRODUODENOSCOPY (EGD) WITH PROPOFOL
Anesthesia: Monitor Anesthesia Care

## 2018-02-03 MED ORDER — SODIUM CHLORIDE 0.9 % IV SOLN
INTRAVENOUS | Status: DC | PRN
  Administered 2018-02-03: 13:00:00 via INTRAVENOUS

## 2018-02-03 MED ORDER — SODIUM CHLORIDE 0.9 % IV SOLN
INTRAVENOUS | Status: DC
  Administered 2018-02-03: 15:00:00 via INTRAVENOUS

## 2018-02-03 MED ORDER — PROPOFOL 500 MG/50ML IV EMUL
INTRAVENOUS | Status: DC | PRN
  Administered 2018-02-03: 100 ug/kg/min via INTRAVENOUS

## 2018-02-03 MED ORDER — POTASSIUM CHLORIDE 10 MEQ/100ML IV SOLN
10.0000 meq | INTRAVENOUS | Status: AC
  Administered 2018-02-03 (×4): 10 meq via INTRAVENOUS
  Filled 2018-02-03 (×4): qty 100

## 2018-02-03 MED ORDER — PROPOFOL 10 MG/ML IV BOLUS
INTRAVENOUS | Status: DC | PRN
  Administered 2018-02-03: 20 mg via INTRAVENOUS

## 2018-02-03 MED ORDER — PANTOPRAZOLE SODIUM 40 MG PO TBEC
40.0000 mg | DELAYED_RELEASE_TABLET | Freq: Two times a day (BID) | ORAL | Status: DC
  Administered 2018-02-03 – 2018-02-09 (×13): 40 mg via ORAL
  Filled 2018-02-03 (×13): qty 1

## 2018-02-03 MED ORDER — SODIUM CHLORIDE 0.9 % IV BOLUS
500.0000 mL | Freq: Once | INTRAVENOUS | Status: AC
  Administered 2018-02-03: 500 mL via INTRAVENOUS

## 2018-02-03 SURGICAL SUPPLY — 14 items

## 2018-02-03 NOTE — Progress Notes (Addendum)
Pt admitted to unit from the ED. Had scope today, family with patient. Pt oriented to unit, bed controls, call light and phone system. Pt's spouse has home medication with her that patient takes - will coordinate with pharmacy. Skin check performed: pt has what appear to be lap site incisions on abdomen that are all open to air, well approximated, with no s/s inflammation (pt had appendectomy a few weeks ago). No other wounds/abrasions/bruises noted aside from what appear to be venipuncture sites on bilateral arms. Telemetry on. Pt denies nausea or pain at this time.

## 2018-02-03 NOTE — Op Note (Signed)
[facility] Patient Name: Tony [name] Procedure Date : 02/03/2018 MRN: [id number] Attending MD: Clarene Essex , MD Date of Birth: Jan 13, 1941 CSN: [id number] Age: 77 Admit Type: Inpatient Procedure:                Upper GI endoscopy Indications:              Hematemesis, Abnormal CT of the GI tract, Nausea                            with vomiting Providers:                Clarene Essex, MD, Kingsley Plan, RN, Elspeth Cho                            Tech., Technician, Claybon Jabs CRNA, CRNA Referring MD:              Medicines:                Propofol total dose 094 mg IV Complications:            No immediate complications. Estimated Blood Loss:     Estimated blood loss: none. Procedure:                Pre-Anesthesia Assessment:                           - Prior to the procedure, a History and Physical                            was performed, and patient medications and                            allergies were reviewed. The patient's tolerance of                            previous anesthesia was also reviewed. The risks                            and benefits of the procedure and the sedation                            options and risks were discussed with the patient.                            All questions were answered, and informed consent                            was obtained. Prior Anticoagulants: The patient has                            taken aspirin, last dose was 1 day prior to                            procedure. ASA Grade Assessment: II - A patient  with mild systemic disease. After reviewing the                            risks and benefits, the patient was deemed in                            satisfactory condition to undergo the procedure.                           After obtaining informed consent, the endoscope was                            passed under direct vision. Throughout the   procedure, the patient's blood pressure, pulse, and                            oxygen saturations were monitored continuously. The                            EG-[id number] ([id number]) scope was introduced through the                            mouth, and advanced to the fourth part of duodenum.                            The upper GI endoscopy was accomplished without                            difficulty. The patient tolerated the procedure                            well. Scope In: Scope Out: Findings:      The larynx was normal.      LA Grade B (one or more mucosal breaks greater than 5 mm, not extending       between the tops of two mucosal folds) esophagitis with no Active       bleeding was found. some from reflux some from recent nausea vomiting       and some from NG tube trauma      Localized mild inflammation characterized by erythema was found in the       gastric antrum. and some other NG tube trauma lesions in the stomach      Localized moderate inflammation characterized by congestion (edema),       erythema and shallow ulcerations was found in the duodenal bulb, in the       first portion of the duodenum and in the second portion of the duodenum.       although no frank obvious deep ulcer was seen with edematous swollen       folds some could've been missed inside the folds but the distal bulb did       seem to have an extrinsic compression component      The third portion of the duodenum and fourth portion of the duodenum       were normal.      The exam was otherwise without abnormality.      A  small hiatal hernia was present. Impression:               - Normal larynx.                           - LA Grade B reflux and erosive esophagitis.                           - Gastritis.                           - Acute duodenitis.                           - Normal third portion of the duodenum and fourth                            portion of the duodenum.                            - The examination was otherwise normal.                           - Small hiatal hernia.                           - No specimens collected. Recommendation:           - Patient has a contact number available for                            emergencies. The signs and symptoms of potential                            delayed complications were discussed with the                            patient. Return to normal activities tomorrow.                            Written discharge instructions were provided to the                            patient.                           - Clear liquid diet today. probably leave on clear                            liquids for a few days                           - No aspirin, ibuprofen, naproxen, or other                            non-steroidal anti-inflammatory drugs long-term  Tylenol only                           - Return to GI clinic PRN. repeat endoscopy when                            necessary and consider to document healing in a few                            months                           - Telephone GI clinic if symptomatic PRN.                           - Use Protonix (pantoprazole) 40 mg PO BID today.                            however if nausea vomiting continues will need IV Procedure Code(s):        --- Professional ---                           [phone], Esophagogastroduodenoscopy, flexible,                            transoral; diagnostic, including collection of                            specimen(s) by brushing or washing, when performed                            (separate procedure) Diagnosis Code(s):        --- Professional ---                           K21.0, Gastro-esophageal reflux disease with                            esophagitis                           K20.8, Other esophagitis                           K29.70, Gastritis, unspecified, without bleeding                           K29.80, Duodenitis  without bleeding                           K92.0, Hematemesis                           R11.2, Nausea with vomiting, unspecified                           R93.3, Abnormal findings on diagnostic imaging of  other parts of digestive tract CPT copyright 2017 [employer]. All rights reserved. The codes documented in this report are preliminary and upon coder review may  be revised to meet current compliance requirements. Clarene Essex, MD 02/03/2018 1:29:56 PM This report has been signed electronically. Number of Addenda: 0

## 2018-02-03 NOTE — Progress Notes (Signed)
Tony [name] is a 77 y.o. male with medical history significant of fundectomy 12 days ago for perforated appendix, hypertension, prostate cancer is post radioactive seed implant but on oral chemotherapy, who presents at the behest of his primary care practitioner for sob, nausea and vomiting underwent CTA of the chest and abdomen. .  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   Plan: Surgery and GI consultation.  Plan for EGD TODAY.      Hosie Poisson, MD [phone]

## 2018-02-03 NOTE — H&P (Signed)
History and Physical    Aldred Mase Magos EGB:[id number] DOB: [birth year] DOA: 02/02/2018  PCP: [name], [name], MD  Patient coming from: Primary care physician's office  I have personally briefly reviewed patient's old medical records in [place]  Chief Complaint: This of breath and spitting up phlegm  HPI: Tony [name] is a 77 y.o. male with medical history significant of fundectomy 12 days ago for perforated appendix, hypertension, prostate cancer is post radioactive seed implant but on oral chemotherapy, who presents at the behest of his primary care practitioner due to concern for pulmonary embolism or lung disease due to shortness of breath.  Has had some abdominal swelling and lower extremity edema and was eating normally until 2 days ago.  He has had significant anorexia over the past 2 days.  He has also been extremely short of breath and had a cough.  Last night he coughed up a large clot and had blood in his mucus today.  He had subjective tactile fevers yesterday.  He has had new lower extremity edema with the right leg larger than the left leg but in the emergency department while lying on the stretcher the edema resolves spontaneously.  Patient does not take any blood thinner only uses a low-dose aspirin.  He did have an episode of vomiting dark vomitus in the emergency department. ED Course: Both pulmonary and abdominal symptoms a CTA of the chest and abdomen was obtained.  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  Possibility includes perforated ulcer and duodenal hematoma.  Dr. Kieth Brightly from surgery saw the patient and felt that management would be medical with placement of NG tube to low wall suction expectant care.  Referred to me for further evaluation and management.  Review of Systems: As per HPI otherwise all other systems reviewed  and  negative.    Past Medical History:  Diagnosis Date  . Hypertension    no meds  . Prostate cancer ([place]) 02/08/14   Gleason 4+5=9, PSA 15.65  . Radiation   . Sinus problem     Past Surgical History:  Procedure Laterality Date  . APPENDECTOMY    . LAPAROSCOPIC APPENDECTOMY N/A 01/21/2018   Procedure: APPENDECTOMY LAPAROSCOPIC;  Surgeon: Kinsinger, Arta Bruce, MD;  Location: [place];  Service: General;  Laterality: N/A;  . PROSTATE BIOPSY  02/08/14   gleason 4+5=9, 12/12 cores positive, 54 gm  . RADIOACTIVE SEED IMPLANT N/A 06/29/2014   Procedure: RADIOACTIVE SEED IMPLANT;  Surgeon: Bernestine Amass, MD;  Location: [facility];  Service: Urology;  Laterality: N/A;    Social History   Social History Narrative  . Not on file     reports that he quit smoking about 5 years ago. His smoking use included cigars. He quit after 60.00 years of use. He has never used smokeless tobacco. He reports that he drinks alcohol. He reports that he does not use drugs.  Allergies  Allergen Reactions  . Penicillins Rash    Has patient had a PCN reaction causing immediate rash, facial/tongue/throat swelling, SOB or lightheadedness with hypotension: Yes Has patient had a PCN reaction causing severe rash involving mucus membranes or skin necrosis: No Has patient had a PCN reaction that required hospitalization: No Has patient had a PCN reaction occurring within the last 10 years: No If all of the above answers are "NO", then may  proceed with Cephalosporin use.    Family History  Problem Relation Age of Onset  . Alzheimer's disease Mother   . Alzheimer's disease Father   . Cancer Father        prostate  . Arthritis Sister   . Cancer Brother        prostate    Prior to Admission medications   Medication Sig Start Date End Date Taking? Authorizing Provider  acetaminophen (TYLENOL) 325 MG tablet Take 2 tablets (650 mg total) by mouth every 6 (six) hours as needed for mild pain (or  Fever >/= 101). 01/23/18  Yes Simaan, Darci Current, PA-C  amLODipine (NORVASC) 2.5 MG tablet Take 1 tablet (2.5 mg total) by mouth daily. 10/19/17  Yes [name], [name], MD  aspirin EC 81 MG tablet Take 81 mg by mouth daily.   Yes [provider]  fluticasone (FLONASE) 50 MCG/ACT nasal spray Place 1 spray into both nostrils daily as needed for allergies or rhinitis.   Yes [provider]  LORazepam (ATIVAN) 1 MG tablet TAKE 1/2 TO 1 TABLET DAILY AS NEEDED Patient taking differently: TAKE 1/2 TABLET BY MOUTH TWICE DAILY 11/08/17  Yes [name], [name], MD  Multiple Vitamin (MULTIVITAMIN WITH MINERALS) TABS tablet Take 1 tablet by mouth daily.   Yes [provider]  naproxen sodium (ALEVE) 220 MG tablet Take 220 mg by mouth 2 (two) times daily as needed (pain/headache).   Yes [provider]  polyethylene glycol (MIRALAX / GLYCOLAX) packet Take 17 g by mouth daily as needed for severe constipation. 01/24/18  Yes Meuth, Brooke A, PA-C  predniSONE (DELTASONE) 5 MG tablet Take 1 tablet (5 mg total) by mouth daily with breakfast. 11/16/17  Yes Shadad, Mathis Dad, MD  psyllium (METAMUCIL SMOOTH TEXTURE) 28 % packet Take 1 packet by mouth 2 (two) times daily. Patient taking differently: Take 1 packet by mouth 2 (two) times daily as needed (constipation).  01/24/18 01/24/19 Yes Meuth, Brooke A, PA-C  traMADol (ULTRAM) 50 MG tablet Take 1 tablet (50 mg total) by mouth every 6 (six) hours as needed (mild pain). 01/24/18  Yes Meuth, Brooke A, PA-C  traZODone (DESYREL) 50 MG tablet Take 0.5-1 tablets (25-50 mg total) by mouth at bedtime as needed for sleep. Patient taking differently: Take 50 mg by mouth at bedtime.  10/19/17  Yes [name], [name], MD  ZYTIGA 250 MG tablet TAKE 4 TABLETS (1000 MG) BY MOUTH DAILY. TAKE ON AN EMPTY STOMACH 1 HOUR BEFORE OR 2 HOURS AFTER A MEAL 01/31/18  Yes Wyatt Portela, MD    Physical Exam:  Constitutional: NAD, calm, uncomfortable appearing with NG tube to  low wall suction draining dark black material Vitals:   02/02/18 2300 02/02/18 2315 02/02/18 2330 02/03/18 0000  BP: 103/72 99/82 114/82 109/80  Pulse: 84 85 83 83  Resp: 18 19 (!) 23 19  Temp:  99.5 F (37.5 C)    TempSrc:  Axillary    SpO2: 95% 96% 96% 94%  Weight:      Height:       Eyes: PERRL, lids and conjunctivae normal ENMT: Mucous membranes are moist. Posterior pharynx clear of any exudate or lesions.Normal dentition.  Neck: normal, supple, no masses, no thyromegaly Respiratory: clear to auscultation bilaterally, no wheezing, no crackles. Normal respiratory effort. No accessory muscle use.  Cardiovascular: Regular rate and rhythm, no murmurs / rubs / gallops.  1+ extremity edema. 2+ pedal pulses. No carotid bruits.  Abdomen: Mild tenderness, no  masses palpated. No hepatosplenomegaly. Bowel sounds quiet Musculoskeletal: no clubbing / cyanosis. No joint deformity upper and lower extremities. Good ROM, no contractures. Normal muscle tone.  Skin: no rashes, lesions, ulcers. No induration Neurologic: CN 2-12 grossly intact. Sensation intact, DTR normal. Strength 5/5 in all 4.  Psychiatric: Normal judgment and insight. Alert and oriented x 3. Normal mood.   Labs on Admission: I have personally reviewed following labs and imaging studies  CBC: Recent Labs  Lab 02/02/18 1752  WBC 12.8*  HGB 14.5  HCT 42.7  MCV 92.2  PLT 008*   Basic Metabolic Panel: Recent Labs  Lab 02/02/18 1752  NA 141  K 3.1*  CL 102  CO2 25  GLUCOSE 158*  BUN 20  CREATININE 1.04  CALCIUM 9.3   GFR: Estimated Creatinine Clearance: 63.5 mL/min (by C-G formula based on SCr of 1.04 mg/dL). Liver Function Tests: Recent Labs  Lab 02/02/18 1930  AST 20  ALT 15*  ALKPHOS 63  BILITOT 1.7*  PROT 6.6  ALBUMIN 3.5   Urine analysis:    Component Value Date/Time   COLORURINE YELLOW 02/02/2018 2156   APPEARANCEUR CLEAR 02/02/2018 2156   LABSPEC >1.046 (H) 02/02/2018 2156   PHURINE 7.0  02/02/2018 2156   GLUCOSEU NEGATIVE 02/02/2018 2156   HGBUR NEGATIVE 02/02/2018 2156   BILIRUBINUR NEGATIVE 02/02/2018 2156   BILIRUBINUR 1+ 01/21/2018 1019   KETONESUR 20 (A) 02/02/2018 2156   PROTEINUR 30 (A) 02/02/2018 2156   UROBILINOGEN 1.0 01/21/2018 1019   UROBILINOGEN 0.2 06/03/2014 0821   NITRITE NEGATIVE 02/02/2018 2156   LEUKOCYTESUR NEGATIVE 02/02/2018 2156    Radiological Exams on Admission: Dg Chest 2 View  Result Date: 02/02/2018 CLINICAL DATA:  Short of breath EXAM: CHEST - 2 VIEW COMPARISON:  05/22/2014 FINDINGS: Hyperinflation with chronic bronchitic changes. No acute consolidation or effusion. Mild cardiomegaly. No pneumothorax. IMPRESSION: 1. Chronic appearing bronchitic changes without acute pulmonary infiltrate 2. Cardiomegaly Electronically Signed   By: Donavan Foil M.D.   On: 02/02/2018 18:23   Ct Angio Chest Pe W And/or Wo Contrast  Result Date: 02/02/2018 CLINICAL DATA:  Shortness of breath tachypnea and vomiting. Laboratory evidence of sepsis. Recent laparoscopic appendectomy for acute appendicitis on 01/21/2018. EXAM: CT ANGIOGRAPHY CHEST CT ABDOMEN AND PELVIS WITH CONTRAST TECHNIQUE: Multidetector CT imaging of the chest was performed using the standard protocol during bolus administration of intravenous contrast. Multiplanar CT image reconstructions and MIPs were obtained to evaluate the vascular anatomy. Multidetector CT imaging of the abdomen and pelvis was performed using the standard protocol during bolus administration of intravenous contrast. CONTRAST:  100 mL ISOVUE-370 IOPAMIDOL (ISOVUE-370) INJECTION IV COMPARISON:  CT of the abdomen and pelvis on 01/21/2018 FINDINGS: CTA CHEST FINDINGS Cardiovascular: Satisfactory opacification of the pulmonary arteries to the segmental level. No evidence of pulmonary embolism. Normal heart size. No pericardial effusion. Coronary atherosclerosis evident with calcified plaque in a 3 vessel distribution. The aortic root is  mildly dilated and measures 4.2 cm in diameter at the level of the sinuses of Valsalva. There is dilatation of the ascending thoracic aorta which measures 4.7 cm in greatest diameter. The aorta reaches a normal caliber of 3.7 cm at the level of the aortic arch. No evidence to suggest aortic dissection. Visualized proximal great vessels show normally patent origins. Mediastinum/Nodes: The esophagus is mildly distended and contains an air-fluid level in its mid segment and fluid in its distal segment. 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 No evidence of pulmonary  consolidation, mass, pneumothorax, edema or pleural fluid. Musculoskeletal: No chest wall abnormality. No acute or significant osseous findings. Review of the MIP images confirms the above findings. CT ABDOMEN and PELVIS FINDINGS Hepatobiliary: No focal liver abnormality is seen. No gallstones, gallbladder wall thickening, or biliary dilatation. Pancreas: Continued atrophic appearance of the pancreas which is infiltrated with fat. Spleen: Normal in size without focal abnormality. Adrenals/Urinary Tract: Adrenal glands are unremarkable. Kidneys are normal, without renal calculi, focal lesion, or hydronephrosis. Bladder is unremarkable. Stomach/Bowel: There is thickening involving the distal descending portion of the duodenum as well as the third portion of the duodenum. This is associated with a significant large area of inflammation in the retroperitoneum with complex fluid present over an area measuring approximately 8 cm in transverse diameter and up to 13 cm in height. This was not present on the recent CT on 05/10. Differential considerations include inflammatory process of the duodenum such as ulcer disease with perforation and surrounding inflammation and also hemorrhage in this region. This is removed from the head of the pancreas and acute pancreatitis is felt much less likely to be a possibility for  findings. There likely is some degree of narrowing of the duodenal lumen at this level as the proximal duodenum and stomach are distended and filled with fluid likely reflecting relative outlet obstruction. No evidence of extraluminal air. No small bowel or colonic dilatation is identified distal to the duodenum. No evidence of postoperative complication at the site of appendectomy. No focal abscess identified. Vascular/Lymphatic: No significant vascular findings are present. No enlarged abdominal or pelvic lymph nodes. Reproductive: Brachytherapy seeds are present in the prostate gland. Other: Bilateral inguinal hernias contain fat, right greater than left. Musculoskeletal: No acute or significant osseous findings. Review of the MIP images confirms the above findings. IMPRESSION: 1. No acute findings in the chest. Incidental note is made of aneurysmal disease of the thoracic aorta with maximal caliber of 4.7 cm at the level of the ascending thoracic aorta. Ascending thoracic aortic aneurysm. Recommend semi-annual imaging followup by CTA or MRA and referral to cardiothoracic surgery if not already obtained. This recommendation follows 2010 ACCF/AHA/AATS/ACR/ASA/SCA/SCAI/SIR/STS/SVM Guidelines for the Diagnosis and Management of Patients With Thoracic Aortic Disease. Circulation. 2010; 121: Z610-R604 2. Coronary atherosclerosis with calcified plaque in a 3 vessel distribution. 3. New significant inflammatory process versus hemorrhage surrounding the descending duodenum in the retroperitoneum. This is likely causing relative obstruction of the duodenal lumen with resultant significant distention of the stomach and distal esophagus with fluid. The patient may benefit from nasogastric decompression of the stomach. It is somewhat unclear what the etiology of the retroperitoneal findings are. This may be on the basis of an inflammatory process such as duodenitis/ruptured duodenal ulcer versus retroperitoneal hemorrhage  surrounding the duodenum. Pancreatitis involving the lateral inferior pancreatic head is felt to be much less likely given location of the dominant retroperitoneal abnormality. Electronically Signed   By: Aletta Edouard M.D.   On: 02/02/2018 21:05   Ct Abdomen Pelvis W Contrast  Result Date: 02/02/2018 CLINICAL DATA:  Shortness of breath tachypnea and vomiting. Laboratory evidence of sepsis. Recent laparoscopic appendectomy for acute appendicitis on 01/21/2018. EXAM: CT ANGIOGRAPHY CHEST CT ABDOMEN AND PELVIS WITH CONTRAST TECHNIQUE: Multidetector CT imaging of the chest was performed using the standard protocol during bolus administration of intravenous contrast. Multiplanar CT image reconstructions and MIPs were obtained to evaluate the vascular anatomy. Multidetector CT imaging of the abdomen and pelvis was performed using the standard protocol during bolus administration  of intravenous contrast. CONTRAST:  100 mL ISOVUE-370 IOPAMIDOL (ISOVUE-370) INJECTION IV COMPARISON:  CT of the abdomen and pelvis on 01/21/2018 FINDINGS: CTA CHEST FINDINGS Cardiovascular: Satisfactory opacification of the pulmonary arteries to the segmental level. No evidence of pulmonary embolism. Normal heart size. No pericardial effusion. Coronary atherosclerosis evident with calcified plaque in a 3 vessel distribution. The aortic root is mildly dilated and measures 4.2 cm in diameter at the level of the sinuses of Valsalva. There is dilatation of the ascending thoracic aorta which measures 4.7 cm in greatest diameter. The aorta reaches a normal caliber of 3.7 cm at the level of the aortic arch. No evidence to suggest aortic dissection. Visualized proximal great vessels show normally patent origins. Mediastinum/Nodes: The esophagus is mildly distended and contains an air-fluid level in its mid segment and fluid in its distal segment. 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 No evidence of  pulmonary consolidation, mass, pneumothorax, edema or pleural fluid. Musculoskeletal: No chest wall abnormality. No acute or significant osseous findings. Review of the MIP images confirms the above findings. CT ABDOMEN and PELVIS FINDINGS Hepatobiliary: No focal liver abnormality is seen. No gallstones, gallbladder wall thickening, or biliary dilatation. Pancreas: Continued atrophic appearance of the pancreas which is infiltrated with fat. Spleen: Normal in size without focal abnormality. Adrenals/Urinary Tract: Adrenal glands are unremarkable. Kidneys are normal, without renal calculi, focal lesion, or hydronephrosis. Bladder is unremarkable. Stomach/Bowel: There is thickening involving the distal descending portion of the duodenum as well as the third portion of the duodenum. This is associated with a significant large area of inflammation in the retroperitoneum with complex fluid present over an area measuring approximately 8 cm in transverse diameter and up to 13 cm in height. This was not present on the recent CT on 05/10. Differential considerations include inflammatory process of the duodenum such as ulcer disease with perforation and surrounding inflammation and also hemorrhage in this region. This is removed from the head of the pancreas and acute pancreatitis is felt much less likely to be a possibility for findings. There likely is some degree of narrowing of the duodenal lumen at this level as the proximal duodenum and stomach are distended and filled with fluid likely reflecting relative outlet obstruction. No evidence of extraluminal air. No small bowel or colonic dilatation is identified distal to the duodenum. No evidence of postoperative complication at the site of appendectomy. No focal abscess identified. Vascular/Lymphatic: No significant vascular findings are present. No enlarged abdominal or pelvic lymph nodes. Reproductive: Brachytherapy seeds are present in the prostate gland. Other:  Bilateral inguinal hernias contain fat, right greater than left. Musculoskeletal: No acute or significant osseous findings. Review of the MIP images confirms the above findings. IMPRESSION: 1. No acute findings in the chest. Incidental note is made of aneurysmal disease of the thoracic aorta with maximal caliber of 4.7 cm at the level of the ascending thoracic aorta. Ascending thoracic aortic aneurysm. Recommend semi-annual imaging followup by CTA or MRA and referral to cardiothoracic surgery if not already obtained. This recommendation follows 2010 ACCF/AHA/AATS/ACR/ASA/SCA/SCAI/SIR/STS/SVM Guidelines for the Diagnosis and Management of Patients With Thoracic Aortic Disease. Circulation. 2010; 121: O973-Z329 2. Coronary atherosclerosis with calcified plaque in a 3 vessel distribution. 3. New significant inflammatory process versus hemorrhage surrounding the descending duodenum in the retroperitoneum. This is likely causing relative obstruction of the duodenal lumen with resultant significant distention of the stomach and distal esophagus with fluid. The patient may benefit from nasogastric decompression of the  stomach. It is somewhat unclear what the etiology of the retroperitoneal findings are. This may be on the basis of an inflammatory process such as duodenitis/ruptured duodenal ulcer versus retroperitoneal hemorrhage surrounding the duodenum. Pancreatitis involving the lateral inferior pancreatic head is felt to be much less likely given location of the dominant retroperitoneal abnormality. Electronically Signed   By: Aletta Edouard M.D.   On: 02/02/2018 21:05      Assessment/Plan Principal Problem:   Complete gastric outlet obstruction Active Problems:   Atherosclerosis of coronary artery without angina pectoris   Aneurysm of thoracic aorta (HCC)   Atrophic pancreas   Emphysema lung (HCC)   Malignant neoplasm of prostate (HCC)   Anxiety disorder   Hypercholesteremia   Essential hypertension,  benign  1.  Complete gastric outlet obstruction: As described above may indeed be due to hemorrhage or perforation of duodenum.  Plan is for NG tube decompression.  I spoke extensively with Dr. Maylon Peppers regarding management.  Patient will need to be n.p.o.  Alternative means of nutrition may need to be considered if patient's symptoms do not improve quickly.  2.  Atherosclerosis of coronary artery without angina pectoris: Occasions on hold for now we will restart when able.  3.  Aneurysm of thoracic aorta noted to be 4.7 cm.  Patient will need vascular surgery evaluation and consultation with likely follow-up serial CT scans every year to 6 months.  4.  Atrophic pancreas: Noted this is a new finding on CT scan.  Patient with no evidence of diabetes at this point or difficulty with digestion will monitor closely.  5.  Emphysema of lung: This is also a new finding noted on CT scan.  Patient will have as needed albuterol nebulizers ordered.  Further evaluation with pulmonary function testing once immediate problem is resolved.  6.  Malignant neoplasm of prostate: Followed by urology with significant improvement status post gold seed implant.  Will need to hold oral chemotherapeutic agent while n.p.o.  7.  Anxiety disorder: Continue treatment with anxiety lytics as needed.  8.  Hypercholesterolemia: Statin medications on hold.  9.  Essential hypertension benign: Blood pressure medications on hold for now.   DVT prophylaxis: SCDs Code Status: Full code Family Communication: Spoke with patient and wife who is present at the time of admission. Disposition Plan: Likely 5 to 7 days hopefully home Consults called: Dr. Maylon Peppers from general surgery, GI was consulted by the ER. Admission status: Inpatient   Lady Deutscher MD FACP [facility] Pager [phone]  If 7PM-7AM, please contact night-coverage [email or web address] Password [email or web address]  02/03/2018, 1:30 AM

## 2018-02-03 NOTE — ED Notes (Signed)
Ice chips given per Admitting MD

## 2018-02-03 NOTE — Progress Notes (Signed)
Tony [name] 12:44 PM  Subjective: Patient seen and examined and discussed with multiple family members as well as my partner Dr. Michail Sermon and hospital computer chart reviewed and he has been on aspirin and nonsteroidals at home and has not had any previous GI procedures nor any history of ulcers and currently is feeling better  Objective: Vital signs stable afebrile exam please see preassessment evaluation CT and labs reviewed  Assessment: Gastric outlet obstruction probably secondary to duodenal abnormality on CT scan  Plan: Okay to proceed with endoscopy with anesthesia assistance  [facility]  Pager [phone] After 5PM or if no answer call [phone]

## 2018-02-03 NOTE — ED Notes (Signed)
ED TO INPATIENT HANDOFF REPORT  Name/Age/Gender Tony [name] 77 y.o. male  Code Status    Code Status Orders  (From admission, onward)        Start     Ordered   02/02/18 2342  Full code  Continuous     02/02/18 2343    Code Status History    Date Active Date Inactive Code Status Order ID Comments User Context   01/21/2018 1958 01/24/2018 1455 Full Code [id number]  Kinsinger, Arta Bruce, MD Inpatient      Home/SNF/Other Home  Chief Complaint Gastric outlet obstruction [K31.1]  Level of Care/Admitting Diagnosis ED Disposition    ED Disposition Condition [facility] Area: [place] [[id number]]  Level of Care: Telemetry [5]  Diagnosis: Complete gastric outlet obstruction [976734]  Admitting Physician: Lady Deutscher [[id number]]  Attending Physician: Lady Deutscher [[id number]]  Estimated length of stay: 3 - 4 days  Certification:: I certify this patient will need inpatient services for at least 2 midnights  PT Class (Do Not Modify): Inpatient [101]  PT Acc Code (Do Not Modify): Private [1]       Medical History Past Medical History:  Diagnosis Date  . Hypertension    no meds  . 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 Radiation   . Sinus problem     Allergies Allergies  Allergen Reactions  . Penicillins Rash    Has patient had a PCN reaction causing immediate rash, facial/tongue/throat swelling, SOB or lightheadedness with hypotension: Yes Has patient had a PCN reaction causing severe rash involving mucus membranes or skin necrosis: No Has patient had a PCN reaction that required hospitalization: No Has patient had a PCN reaction occurring within the last 10 years: No If all of the above answers are "NO", then may proceed with Cephalosporin use.    IV Location/Drains/Wounds Patient Lines/Drains/Airways Status   Active Line/Drains/Airways    Name:   Placement date:   Placement time:   Site:    Days:   Peripheral IV 02/02/18 Left Antecubital   02/02/18    1943    Antecubital   1   Peripheral IV 02/02/18 Left Hand   02/02/18    1943    Hand   1   Incision (Closed) 06/29/14 Perineum Other (Comment)   06/29/14    1013     1315   Incision (Closed) 01/21/18 Abdomen Other (Comment)   01/21/18    1845     13   Incision - 3 Ports Abdomen 1: Left;Lateral;Mid 2: Left;Lateral;Upper 3: Umbilicus   53/29/92    4268     13          Labs/Imaging Results for orders placed or performed during the hospital encounter of 02/02/18 (from the past 48 hour(s))  Basic metabolic panel     Status: Abnormal   Collection Time: 02/02/18  5:52 PM  Result Value Ref Range   Sodium 141 135 - 145 mmol/L   Potassium 3.1 (L) 3.5 - 5.1 mmol/L   Chloride 102 101 - 111 mmol/L   CO2 25 22 - 32 mmol/L   Glucose, Bld 158 (H) 65 - 99 mg/dL   BUN 20 6 - 20 mg/dL   Creatinine, Ser 1.04 0.61 - 1.24 mg/dL   Calcium 9.3 8.9 - 10.3 mg/dL   GFR calc non Af Amer >60 >60 mL/min   GFR calc Af Amer >60 >60 mL/min  Comment: (NOTE) The eGFR has been calculated using the CKD EPI equation. This calculation has not been validated in all clinical situations. eGFR's persistently <60 mL/min signify possible Chronic Kidney Disease.    Anion gap 14 5 - 15    Comment: Performed at [facility] [address], [place], [place] [address]  CBC     Status: Abnormal   Collection Time: 02/02/18  5:52 PM  Result Value Ref Range   WBC 12.8 (H) 4.0 - 10.5 K/uL   RBC 4.63 4.22 - 5.81 MIL/uL   Hemoglobin 14.5 13.0 - 17.0 g/dL   HCT 42.7 39.0 - 52.0 %   MCV 92.2 78.0 - 100.0 fL   MCH 31.3 26.0 - 34.0 pg   MCHC 34.0 30.0 - 36.0 g/dL   RDW 12.7 11.5 - 15.5 %   Platelets 415 (H) 150 - 400 K/uL    Comment: Performed at [facility] [address], [place], [place] [address]  I-Stat CG4 Lactic Acid, ED     Status: Abnormal   Collection Time: 02/02/18  6:28 PM  Result Value Ref Range   Lactic Acid, Venous 2.59 (HH) 0.5 -  1.9 mmol/L   Comment NOTIFIED PHYSICIAN   I-stat troponin, ED     Status: None   Collection Time: 02/02/18  6:37 PM  Result Value Ref Range   Troponin i, poc 0.00 0.00 - 0.08 ng/mL   Comment 3            Comment: Due to the release kinetics of cTnI, a negative result within the first hours of the onset of symptoms does not rule out myocardial infarction with certainty. If myocardial infarction is still suspected, repeat the test at appropriate intervals.   Brain natriuretic peptide     Status: Abnormal   Collection Time: 02/02/18  7:30 PM  Result Value Ref Range   B Natriuretic Peptide 226.5 (H) 0.0 - 100.0 pg/mL    Comment: Performed at [facility] [address] [place], [place] [address]  Blood culture (routine x 2)     Status: None (Preliminary result)   Collection Time: 02/02/18  7:30 PM  Result Value Ref Range   Specimen Description BLOOD LEFT ARM    Special Requests      BOTTLES DRAWN AEROBIC AND ANAEROBIC Blood Culture adequate volume   Culture      NO GROWTH < 24 HOURS Performed at [facility], [place] [address] [place], [place] [address]    Report Status PENDING   Hepatic function panel     Status: Abnormal   Collection Time: 02/02/18  7:30 PM  Result Value Ref Range   Total Protein 6.6 6.5 - 8.1 g/dL   Albumin 3.5 3.5 - 5.0 g/dL   AST 20 15 - 41 U/L   ALT 15 (L) 17 - 63 U/L   Alkaline Phosphatase 63 38 - 126 U/L   Total Bilirubin 1.7 (H) 0.3 - 1.2 mg/dL   Bilirubin, Direct 0.3 0.1 - 0.5 mg/dL   Indirect Bilirubin 1.4 (H) 0.3 - 0.9 mg/dL    Comment: Performed at [facility] [address] [place], [place] [address]  Blood culture (routine x 2)     Status: None (Preliminary result)   Collection Time: 02/02/18  7:33 PM  Result Value Ref Range   Specimen Description BLOOD RIGHT HAND    Special Requests      BOTTLES DRAWN AEROBIC AND ANAEROBIC Blood  Culture adequate volume   Culture      NO GROWTH < 24 HOURS Performed at [facility] [address], [place], [place] [address]    Report Status PENDING   I-Stat CG4 Lactic Acid, ED     Status: Abnormal   Collection Time: 02/02/18  7:47 PM  Result Value Ref Range   Lactic Acid, Venous 2.20 (HH) 0.5 - 1.9 mmol/L   Comment NOTIFIED PHYSICIAN   Urinalysis, Routine w reflex microscopic     Status: Abnormal   Collection Time: 02/02/18  9:56 PM  Result Value Ref Range   Color, Urine YELLOW YELLOW   APPearance CLEAR CLEAR   Specific Gravity, Urine >1.046 (H) 1.005 - 1.030   pH 7.0 5.0 - 8.0   Glucose, UA NEGATIVE NEGATIVE mg/dL   Hgb urine dipstick NEGATIVE NEGATIVE   Bilirubin Urine NEGATIVE NEGATIVE   Ketones, ur 20 (A) NEGATIVE mg/dL   Protein, ur 30 (A) NEGATIVE mg/dL   Nitrite NEGATIVE NEGATIVE   Leukocytes, UA NEGATIVE NEGATIVE   RBC / HPF 0-5 0 - 5 RBC/hpf   WBC, UA 0-5 0 - 5 WBC/hpf   Bacteria, UA NONE SEEN NONE SEEN   Squamous Epithelial / LPF 0-5 0 - 5   Mucus PRESENT     Comment: Performed at [facility], [address] [place], [place] [address]  Basic metabolic panel     Status: Abnormal   Collection Time: 02/03/18  4:16 AM  Result Value Ref Range   Sodium 144 135 - 145 mmol/L   Potassium 2.7 (LL) 3.5 - 5.1 mmol/L    Comment: CRITICAL RESULT CALLED TO, READ BACK BY AND VERIFIED WITH: TOBIAS M,RN 02/03/18 0622 WAYK    Chloride 106 101 - 111 mmol/L   CO2 27 22 - 32 mmol/L   Glucose, Bld 122 (H) 65 - 99 mg/dL   BUN 19 6 - 20 mg/dL   Creatinine, Ser 0.91 0.61 - 1.24 mg/dL   Calcium 8.3 (L) 8.9 - 10.3 mg/dL   GFR calc non Af Amer >60 >60 mL/min   GFR calc Af Amer >60 >60 mL/min    Comment: (NOTE) The eGFR has been calculated using the CKD EPI equation. This calculation has not been validated in all clinical situations. eGFR's persistently <60 mL/min signify possible Chronic Kidney Disease.    Anion gap 11 5 - 15    Comment: Performed at [facility] [address], [place], [place] [address]  CBC     Status: Abnormal    Collection Time: 02/03/18  4:16 AM  Result Value Ref Range   WBC 10.8 (H) 4.0 - 10.5 K/uL   RBC 3.90 (L) 4.22 - 5.81 MIL/uL   Hemoglobin 12.2 (L) 13.0 - 17.0 g/dL   HCT 36.1 (L) 39.0 - 52.0 %   MCV 92.6 78.0 - 100.0 fL   MCH 31.3 26.0 - 34.0 pg   MCHC 33.8 30.0 - 36.0 g/dL   RDW 13.0 11.5 - 15.5 %   Platelets 310 150 - 400 K/uL    Comment: Performed at [facility], [place] [address], [name], [place] [address]  Lactic acid, plasma     Status: None   Collection Time: 02/03/18  4:17 AM  Result Value Ref Range   Lactic Acid, Venous 1.3 0.5 - 1.9 mmol/L    Comment: Performed at [facility] [address], [place], [place] [address]   Dg Chest 2 View  Result Date: 02/02/2018 CLINICAL DATA:  Short of breath EXAM: CHEST - 2 VIEW COMPARISON:  05/22/2014 FINDINGS: Hyperinflation with chronic bronchitic changes. No acute consolidation or effusion. Mild cardiomegaly. No pneumothorax. IMPRESSION: 1. Chronic appearing bronchitic changes without acute pulmonary infiltrate 2. Cardiomegaly Electronically Signed   By: Donavan Foil M.D.   On: 02/02/2018 18:23   Ct Angio Chest Pe W And/or Wo Contrast  Result Date: 02/02/2018 CLINICAL DATA:  Shortness of breath tachypnea and vomiting. Laboratory evidence of sepsis. Recent laparoscopic appendectomy for acute appendicitis on 01/21/2018. EXAM: CT ANGIOGRAPHY CHEST CT ABDOMEN AND PELVIS WITH CONTRAST TECHNIQUE: Multidetector CT imaging of the chest was performed using the standard protocol during bolus administration of intravenous contrast. Multiplanar CT image reconstructions and MIPs were obtained to evaluate the vascular anatomy. Multidetector CT imaging of the abdomen and pelvis was performed using the standard protocol during bolus administration of intravenous contrast. CONTRAST:  100 mL ISOVUE-370 IOPAMIDOL (ISOVUE-370) INJECTION IV COMPARISON:  CT of the abdomen and pelvis on 01/21/2018 FINDINGS: CTA CHEST FINDINGS Cardiovascular: Satisfactory  opacification of the pulmonary arteries to the segmental level. No evidence of pulmonary embolism. Normal heart size. No pericardial effusion. Coronary atherosclerosis evident with calcified plaque in a 3 vessel distribution. The aortic root is mildly dilated and measures 4.2 cm in diameter at the level of the sinuses of Valsalva. There is dilatation of the ascending thoracic aorta which measures 4.7 cm in greatest diameter. The aorta reaches a normal caliber of 3.7 cm at the level of the aortic arch. No evidence to suggest aortic dissection. Visualized proximal great vessels show normally patent origins. Mediastinum/Nodes: The esophagus is mildly distended and contains an air-fluid level in its mid segment and fluid in its distal segment. 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 No evidence of pulmonary consolidation, mass, pneumothorax, edema or pleural fluid. Musculoskeletal: No chest wall abnormality. No acute or significant osseous findings. Review of the MIP images confirms the above findings. CT ABDOMEN and PELVIS FINDINGS Hepatobiliary: No focal liver abnormality is seen. No gallstones, gallbladder wall thickening, or biliary dilatation. Pancreas: Continued atrophic appearance of the pancreas which is infiltrated with fat. Spleen: Normal in size without focal abnormality. Adrenals/Urinary Tract: Adrenal glands are unremarkable. Kidneys are normal, without renal calculi, focal lesion, or hydronephrosis. Bladder is unremarkable. Stomach/Bowel: There is thickening involving the distal descending portion of the duodenum as well as the third portion of the duodenum. This is associated with a significant large area of inflammation in the retroperitoneum with complex fluid present over an area measuring approximately 8 cm in transverse diameter and up to 13 cm in height. This was not present on the recent CT on 05/10. Differential considerations include inflammatory  process of the duodenum such as ulcer disease with perforation and surrounding inflammation and also hemorrhage in this region. This is removed from the head of the pancreas and acute pancreatitis is felt much less likely to be a possibility for findings. There likely is some degree of narrowing of the duodenal lumen at this level as the proximal duodenum and stomach are distended and filled with fluid likely reflecting relative outlet obstruction. No evidence of extraluminal air. No small bowel or colonic dilatation is identified distal to the duodenum. No evidence of postoperative complication at the site of appendectomy. No focal abscess identified. Vascular/Lymphatic: No significant vascular findings are present. No enlarged abdominal or pelvic lymph nodes. Reproductive: Brachytherapy seeds are present in the prostate gland. Other: Bilateral inguinal hernias contain  fat, right greater than left. Musculoskeletal: No acute or significant osseous findings. Review of the MIP images confirms the above findings. IMPRESSION: 1. No acute findings in the chest. Incidental note is made of aneurysmal disease of the thoracic aorta with maximal caliber of 4.7 cm at the level of the ascending thoracic aorta. Ascending thoracic aortic aneurysm. Recommend semi-annual imaging followup by CTA or MRA and referral to cardiothoracic surgery if not already obtained. This recommendation follows 2010 ACCF/AHA/AATS/ACR/ASA/SCA/SCAI/SIR/STS/SVM Guidelines for the Diagnosis and Management of Patients With Thoracic Aortic Disease. Circulation. 2010; 121: G182-X937 2. Coronary atherosclerosis with calcified plaque in a 3 vessel distribution. 3. New significant inflammatory process versus hemorrhage surrounding the descending duodenum in the retroperitoneum. This is likely causing relative obstruction of the duodenal lumen with resultant significant distention of the stomach and distal esophagus with fluid. The patient may benefit from  nasogastric decompression of the stomach. It is somewhat unclear what the etiology of the retroperitoneal findings are. This may be on the basis of an inflammatory process such as duodenitis/ruptured duodenal ulcer versus retroperitoneal hemorrhage surrounding the duodenum. Pancreatitis involving the lateral inferior pancreatic head is felt to be much less likely given location of the dominant retroperitoneal abnormality. Electronically Signed   By: Aletta Edouard M.D.   On: 02/02/2018 21:05   Ct Abdomen Pelvis W Contrast  Result Date: 02/02/2018 CLINICAL DATA:  Shortness of breath tachypnea and vomiting. Laboratory evidence of sepsis. Recent laparoscopic appendectomy for acute appendicitis on 01/21/2018. EXAM: CT ANGIOGRAPHY CHEST CT ABDOMEN AND PELVIS WITH CONTRAST TECHNIQUE: Multidetector CT imaging of the chest was performed using the standard protocol during bolus administration of intravenous contrast. Multiplanar CT image reconstructions and MIPs were obtained to evaluate the vascular anatomy. Multidetector CT imaging of the abdomen and pelvis was performed using the standard protocol during bolus administration of intravenous contrast. CONTRAST:  100 mL ISOVUE-370 IOPAMIDOL (ISOVUE-370) INJECTION IV COMPARISON:  CT of the abdomen and pelvis on 01/21/2018 FINDINGS: CTA CHEST FINDINGS Cardiovascular: Satisfactory opacification of the pulmonary arteries to the segmental level. No evidence of pulmonary embolism. Normal heart size. No pericardial effusion. Coronary atherosclerosis evident with calcified plaque in a 3 vessel distribution. The aortic root is mildly dilated and measures 4.2 cm in diameter at the level of the sinuses of Valsalva. There is dilatation of the ascending thoracic aorta which measures 4.7 cm in greatest diameter. The aorta reaches a normal caliber of 3.7 cm at the level of the aortic arch. No evidence to suggest aortic dissection. Visualized proximal great vessels show normally patent  origins. Mediastinum/Nodes: The esophagus is mildly distended and contains an air-fluid level in its mid segment and fluid in its distal segment. 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 No evidence of pulmonary consolidation, mass, pneumothorax, edema or pleural fluid. Musculoskeletal: No chest wall abnormality. No acute or significant osseous findings. Review of the MIP images confirms the above findings. CT ABDOMEN and PELVIS FINDINGS Hepatobiliary: No focal liver abnormality is seen. No gallstones, gallbladder wall thickening, or biliary dilatation. Pancreas: Continued atrophic appearance of the pancreas which is infiltrated with fat. Spleen: Normal in size without focal abnormality. Adrenals/Urinary Tract: Adrenal glands are unremarkable. Kidneys are normal, without renal calculi, focal lesion, or hydronephrosis. Bladder is unremarkable. Stomach/Bowel: There is thickening involving the distal descending portion of the duodenum as well as the third portion of the duodenum. This is associated with a significant large area of inflammation in the retroperitoneum with complex fluid present over an area  measuring approximately 8 cm in transverse diameter and up to 13 cm in height. This was not present on the recent CT on 05/10. Differential considerations include inflammatory process of the duodenum such as ulcer disease with perforation and surrounding inflammation and also hemorrhage in this region. This is removed from the head of the pancreas and acute pancreatitis is felt much less likely to be a possibility for findings. There likely is some degree of narrowing of the duodenal lumen at this level as the proximal duodenum and stomach are distended and filled with fluid likely reflecting relative outlet obstruction. No evidence of extraluminal air. No small bowel or colonic dilatation is identified distal to the duodenum. No evidence of postoperative complication at  the site of appendectomy. No focal abscess identified. Vascular/Lymphatic: No significant vascular findings are present. No enlarged abdominal or pelvic lymph nodes. Reproductive: Brachytherapy seeds are present in the prostate gland. Other: Bilateral inguinal hernias contain fat, right greater than left. Musculoskeletal: No acute or significant osseous findings. Review of the MIP images confirms the above findings. IMPRESSION: 1. No acute findings in the chest. Incidental note is made of aneurysmal disease of the thoracic aorta with maximal caliber of 4.7 cm at the level of the ascending thoracic aorta. Ascending thoracic aortic aneurysm. Recommend semi-annual imaging followup by CTA or MRA and referral to cardiothoracic surgery if not already obtained. This recommendation follows 2010 ACCF/AHA/AATS/ACR/ASA/SCA/SCAI/SIR/STS/SVM Guidelines for the Diagnosis and Management of Patients With Thoracic Aortic Disease. Circulation. 2010; 121: Z025-E527 2. Coronary atherosclerosis with calcified plaque in a 3 vessel distribution. 3. New significant inflammatory process versus hemorrhage surrounding the descending duodenum in the retroperitoneum. This is likely causing relative obstruction of the duodenal lumen with resultant significant distention of the stomach and distal esophagus with fluid. The patient may benefit from nasogastric decompression of the stomach. It is somewhat unclear what the etiology of the retroperitoneal findings are. This may be on the basis of an inflammatory process such as duodenitis/ruptured duodenal ulcer versus retroperitoneal hemorrhage surrounding the duodenum. Pancreatitis involving the lateral inferior pancreatic head is felt to be much less likely given location of the dominant retroperitoneal abnormality. Electronically Signed   By: Aletta Edouard M.D.   On: 02/02/2018 21:05    Pending Labs Unresulted Labs (From admission, onward)   None      Vitals/Pain Today's Vitals    02/03/18 1320 02/03/18 1345 02/03/18 1400 02/03/18 1415  BP: 120/65 126/81 (!) 113/92 126/87  Pulse:  61 68 64  Resp: (!) 96 (!) 27 (!) 28 (!) 31  Temp: 98 F (36.7 C)     TempSrc: Oral     SpO2: 95% 99% 96% 95%  Weight:      Height:      PainSc: 0-No pain       Isolation Precautions No active isolations  Medications Medications  LORazepam (ATIVAN) injection 0.5 mg ( Intravenous MAR Unhold 02/03/18 1340)  fluticasone (FLONASE) 50 MCG/ACT nasal spray 1 spray ( Each Nare MAR Unhold 02/03/18 1340)  ondansetron (ZOFRAN) tablet 4 mg ( Oral MAR Unhold 02/03/18 1340)    Or  ondansetron (ZOFRAN) injection 4 mg ( Intravenous MAR Unhold 02/03/18 1340)  albuterol (PROVENTIL) (2.5 MG/3ML) 0.083% nebulizer solution 2.5 mg ( Nebulization MAR Unhold 02/03/18 1340)  guaiFENesin (MUCINEX) 12 hr tablet 600 mg ( Oral MAR Unhold 02/03/18 1340)  0.9 %  sodium chloride infusion ( Intravenous New Bag/Given 02/03/18 1431)  pantoprazole (PROTONIX) EC tablet 40 mg (40 mg Oral Given 02/03/18 1431)  albuterol (PROVENTIL) (2.5 MG/3ML) 0.083% nebulizer solution 5 mg (5 mg Nebulization Given 02/02/18 1934)  levofloxacin (LEVAQUIN) IVPB 750 mg (0 mg Intravenous Stopped 02/02/18 2059)  aztreonam (AZACTAM) 2 g in sodium chloride 0.9 % 100 mL IVPB (0 g Intravenous Stopped 02/02/18 2136)  vancomycin (VANCOCIN) 1,750 mg in sodium chloride 0.9 % 500 mL IVPB (0 mg Intravenous Stopped 02/02/18 2137)  iopamidol (ISOVUE-370) 76 % injection 100 mL (100 mLs Intravenous Contrast Given 02/02/18 2026)  sodium chloride 0.9 % bolus 500 mL (0 mLs Intravenous Stopped 02/02/18 2137)  sodium chloride 0.9 % bolus 500 mL (0 mLs Intravenous Stopped 02/03/18 0637)  potassium chloride 10 mEq in 100 mL IVPB (0 mEq Intravenous Stopped 02/03/18 1222)    Mobility walks with one assist

## 2018-02-03 NOTE — Anesthesia Postprocedure Evaluation (Signed)
Anesthesia Post Note  Patient: Shloima Clinch Manges  Procedure(s) Performed: ESOPHAGOGASTRODUODENOSCOPY (EGD) WITH PROPOFOL (N/A )     Patient location during evaluation: PACU Anesthesia Type: MAC Level of consciousness: awake and alert Pain management: pain level controlled Vital Signs Assessment: post-procedure vital signs reviewed and stable Respiratory status: spontaneous breathing, nonlabored ventilation, respiratory function stable and patient connected to nasal cannula oxygen Cardiovascular status: stable and blood pressure returned to baseline Postop Assessment: no apparent nausea or vomiting Anesthetic complications: no    Last Vitals:  Vitals:   02/03/18 1400 02/03/18 1415  BP: (!) 113/92 126/87  Pulse: 68 64  Resp: (!) 28 (!) 31  Temp:    SpO2: 96% 95%    Last Pain:  Vitals:   02/03/18 1320  TempSrc: Oral  PainSc: 0-No pain                 Effie Berkshire

## 2018-02-03 NOTE — ED Notes (Signed)
Pt transported to endo.  

## 2018-02-03 NOTE — Progress Notes (Addendum)
[facility] Progress Note     Subjective: CC: wants to get to a room Patient hoping to get to his room soon so his wife can rest some. He tells me since NGT insertion he has filled up 2.5 cannisters, but feels much better. No flatus. Only mild tenderness. Nausea improved.   Objective: Vital signs in last 24 hours: Temp:  [97.5 F (36.4 C)-99.5 F (37.5 C)] 99.5 F (37.5 C) (05/22 2315) Pulse Rate:  [68-109] 74 (05/23 0700) Resp:  [15-35] 18 (05/23 0700) BP: (99-145)/(72-101) 145/90 (05/23 0700) SpO2:  [90 %-100 %] 95 % (05/23 0700) Weight:  [90.7 kg (200 lb)] 90.7 kg (200 lb) (05/22 1941)    Intake/Output from previous day: 05/22 0701 - 05/23 0700 In: 5956 [I.V.:2200; IV Piggyback:1750] Out: 3350 [Urine:650; Emesis/NG output:1900] Intake/Output this shift: No intake/output data recorded.  PE: Gen:  Alert, NAD, pleasant Card:  Regular rate and rhythm, pedal pulses 2+ BL Pulm:  Normal effort, clear to auscultation bilaterally Abd: Soft, minimally tender, non-distended, bowel sounds hypoactive, no HSM, incisions C/D/I with moderate ecchymosis Skin: warm and dry, no rashes  Psych: A&Ox3   Lab Results:  Recent Labs    02/02/18 1752 02/03/18 0416  WBC 12.8* 10.8*  HGB 14.5 12.2*  HCT 42.7 36.1*  PLT 415* 310   BMET Recent Labs    02/02/18 1752 02/03/18 0416  NA 141 144  K 3.1* 2.7*  CL 102 106  CO2 25 27  GLUCOSE 158* 122*  BUN 20 19  CREATININE 1.04 0.91  CALCIUM 9.3 8.3*   PT/INR No results for input(s): LABPROT, INR in the last 72 hours. CMP     Component Value Date/Time   NA 144 02/03/2018 0416   NA 141 08/27/2017 0915   K 2.7 (LL) 02/03/2018 0416   K 4.8 08/27/2017 0915   CL 106 02/03/2018 0416   CO2 27 02/03/2018 0416   CO2 26 08/27/2017 0915   GLUCOSE 122 (H) 02/03/2018 0416   GLUCOSE 108 08/27/2017 0915   BUN 19 02/03/2018 0416   BUN 16.2 08/27/2017 0915   CREATININE 0.91 02/03/2018 0416   CREATININE 0.9 08/27/2017 0915   CALCIUM 8.3 (L) 02/03/2018 0416   CALCIUM 9.8 08/27/2017 0915   PROT 6.6 02/02/2018 1930   PROT 7.3 08/27/2017 0915   ALBUMIN 3.5 02/02/2018 1930   ALBUMIN 4.1 08/27/2017 0915   AST 20 02/02/2018 1930   AST 23 08/27/2017 0915   ALT 15 (L) 02/02/2018 1930   ALT 17 08/27/2017 0915   ALKPHOS 63 02/02/2018 1930   ALKPHOS 83 08/27/2017 0915   BILITOT 1.7 (H) 02/02/2018 1930   BILITOT 0.95 08/27/2017 0915   GFRNONAA >60 02/03/2018 0416   GFRAA >60 02/03/2018 0416   Lipase     Component Value Date/Time   LIPASE 21 01/21/2018 1500       Studies/Results: Dg Chest 2 View  Result Date: 02/02/2018 CLINICAL DATA:  Short of breath EXAM: CHEST - 2 VIEW COMPARISON:  05/22/2014 FINDINGS: Hyperinflation with chronic bronchitic changes. No acute consolidation or effusion. Mild cardiomegaly. No pneumothorax. IMPRESSION: 1. Chronic appearing bronchitic changes without acute pulmonary infiltrate 2. Cardiomegaly Electronically Signed   By: Donavan Foil M.D.   On: 02/02/2018 18:23   Ct Angio Chest Pe W And/or Wo Contrast  Result Date: 02/02/2018 CLINICAL DATA:  Shortness of breath tachypnea and vomiting. Laboratory evidence of sepsis. Recent laparoscopic appendectomy for acute appendicitis on 01/21/2018. EXAM: CT ANGIOGRAPHY CHEST CT ABDOMEN AND PELVIS WITH  CONTRAST TECHNIQUE: Multidetector CT imaging of the chest was performed using the standard protocol during bolus administration of intravenous contrast. Multiplanar CT image reconstructions and MIPs were obtained to evaluate the vascular anatomy. Multidetector CT imaging of the abdomen and pelvis was performed using the standard protocol during bolus administration of intravenous contrast. CONTRAST:  100 mL ISOVUE-370 IOPAMIDOL (ISOVUE-370) INJECTION IV COMPARISON:  CT of the abdomen and pelvis on 01/21/2018 FINDINGS: CTA CHEST FINDINGS Cardiovascular: Satisfactory opacification of the pulmonary arteries to the segmental level. No evidence of pulmonary  embolism. Normal heart size. No pericardial effusion. Coronary atherosclerosis evident with calcified plaque in a 3 vessel distribution. The aortic root is mildly dilated and measures 4.2 cm in diameter at the level of the sinuses of Valsalva. There is dilatation of the ascending thoracic aorta which measures 4.7 cm in greatest diameter. The aorta reaches a normal caliber of 3.7 cm at the level of the aortic arch. No evidence to suggest aortic dissection. Visualized proximal great vessels show normally patent origins. Mediastinum/Nodes: The esophagus is mildly distended and contains an air-fluid level in its mid segment and fluid in its distal segment. 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 No evidence of pulmonary consolidation, mass, pneumothorax, edema or pleural fluid. Musculoskeletal: No chest wall abnormality. No acute or significant osseous findings. Review of the MIP images confirms the above findings. CT ABDOMEN and PELVIS FINDINGS Hepatobiliary: No focal liver abnormality is seen. No gallstones, gallbladder wall thickening, or biliary dilatation. Pancreas: Continued atrophic appearance of the pancreas which is infiltrated with fat. Spleen: Normal in size without focal abnormality. Adrenals/Urinary Tract: Adrenal glands are unremarkable. Kidneys are normal, without renal calculi, focal lesion, or hydronephrosis. Bladder is unremarkable. Stomach/Bowel: There is thickening involving the distal descending portion of the duodenum as well as the third portion of the duodenum. This is associated with a significant large area of inflammation in the retroperitoneum with complex fluid present over an area measuring approximately 8 cm in transverse diameter and up to 13 cm in height. This was not present on the recent CT on 05/10. Differential considerations include inflammatory process of the duodenum such as ulcer disease with perforation and surrounding inflammation  and also hemorrhage in this region. This is removed from the head of the pancreas and acute pancreatitis is felt much less likely to be a possibility for findings. There likely is some degree of narrowing of the duodenal lumen at this level as the proximal duodenum and stomach are distended and filled with fluid likely reflecting relative outlet obstruction. No evidence of extraluminal air. No small bowel or colonic dilatation is identified distal to the duodenum. No evidence of postoperative complication at the site of appendectomy. No focal abscess identified. Vascular/Lymphatic: No significant vascular findings are present. No enlarged abdominal or pelvic lymph nodes. Reproductive: Brachytherapy seeds are present in the prostate gland. Other: Bilateral inguinal hernias contain fat, right greater than left. Musculoskeletal: No acute or significant osseous findings. Review of the MIP images confirms the above findings. IMPRESSION: 1. No acute findings in the chest. Incidental note is made of aneurysmal disease of the thoracic aorta with maximal caliber of 4.7 cm at the level of the ascending thoracic aorta. Ascending thoracic aortic aneurysm. Recommend semi-annual imaging followup by CTA or MRA and referral to cardiothoracic surgery if not already obtained. This recommendation follows 2010 ACCF/AHA/AATS/ACR/ASA/SCA/SCAI/SIR/STS/SVM Guidelines for the Diagnosis and Management of Patients With Thoracic Aortic Disease. Circulation. 2010; 121: N397-Q734 2. Coronary atherosclerosis with calcified plaque  in a 3 vessel distribution. 3. New significant inflammatory process versus hemorrhage surrounding the descending duodenum in the retroperitoneum. This is likely causing relative obstruction of the duodenal lumen with resultant significant distention of the stomach and distal esophagus with fluid. The patient may benefit from nasogastric decompression of the stomach. It is somewhat unclear what the etiology of the  retroperitoneal findings are. This may be on the basis of an inflammatory process such as duodenitis/ruptured duodenal ulcer versus retroperitoneal hemorrhage surrounding the duodenum. Pancreatitis involving the lateral inferior pancreatic head is felt to be much less likely given location of the dominant retroperitoneal abnormality. Electronically Signed   By: Aletta Edouard M.D.   On: 02/02/2018 21:05   Ct Abdomen Pelvis W Contrast  Result Date: 02/02/2018 CLINICAL DATA:  Shortness of breath tachypnea and vomiting. Laboratory evidence of sepsis. Recent laparoscopic appendectomy for acute appendicitis on 01/21/2018. EXAM: CT ANGIOGRAPHY CHEST CT ABDOMEN AND PELVIS WITH CONTRAST TECHNIQUE: Multidetector CT imaging of the chest was performed using the standard protocol during bolus administration of intravenous contrast. Multiplanar CT image reconstructions and MIPs were obtained to evaluate the vascular anatomy. Multidetector CT imaging of the abdomen and pelvis was performed using the standard protocol during bolus administration of intravenous contrast. CONTRAST:  100 mL ISOVUE-370 IOPAMIDOL (ISOVUE-370) INJECTION IV COMPARISON:  CT of the abdomen and pelvis on 01/21/2018 FINDINGS: CTA CHEST FINDINGS Cardiovascular: Satisfactory opacification of the pulmonary arteries to the segmental level. No evidence of pulmonary embolism. Normal heart size. No pericardial effusion. Coronary atherosclerosis evident with calcified plaque in a 3 vessel distribution. The aortic root is mildly dilated and measures 4.2 cm in diameter at the level of the sinuses of Valsalva. There is dilatation of the ascending thoracic aorta which measures 4.7 cm in greatest diameter. The aorta reaches a normal caliber of 3.7 cm at the level of the aortic arch. No evidence to suggest aortic dissection. Visualized proximal great vessels show normally patent origins. Mediastinum/Nodes: The esophagus is mildly distended and contains an air-fluid  level in its mid segment and fluid in its distal segment. 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 No evidence of pulmonary consolidation, mass, pneumothorax, edema or pleural fluid. Musculoskeletal: No chest wall abnormality. No acute or significant osseous findings. Review of the MIP images confirms the above findings. CT ABDOMEN and PELVIS FINDINGS Hepatobiliary: No focal liver abnormality is seen. No gallstones, gallbladder wall thickening, or biliary dilatation. Pancreas: Continued atrophic appearance of the pancreas which is infiltrated with fat. Spleen: Normal in size without focal abnormality. Adrenals/Urinary Tract: Adrenal glands are unremarkable. Kidneys are normal, without renal calculi, focal lesion, or hydronephrosis. Bladder is unremarkable. Stomach/Bowel: There is thickening involving the distal descending portion of the duodenum as well as the third portion of the duodenum. This is associated with a significant large area of inflammation in the retroperitoneum with complex fluid present over an area measuring approximately 8 cm in transverse diameter and up to 13 cm in height. This was not present on the recent CT on 05/10. Differential considerations include inflammatory process of the duodenum such as ulcer disease with perforation and surrounding inflammation and also hemorrhage in this region. This is removed from the head of the pancreas and acute pancreatitis is felt much less likely to be a possibility for findings. There likely is some degree of narrowing of the duodenal lumen at this level as the proximal duodenum and stomach are distended and filled with fluid likely reflecting relative outlet obstruction. No  evidence of extraluminal air. No small bowel or colonic dilatation is identified distal to the duodenum. No evidence of postoperative complication at the site of appendectomy. No focal abscess identified. Vascular/Lymphatic: No  significant vascular findings are present. No enlarged abdominal or pelvic lymph nodes. Reproductive: Brachytherapy seeds are present in the prostate gland. Other: Bilateral inguinal hernias contain fat, right greater than left. Musculoskeletal: No acute or significant osseous findings. Review of the MIP images confirms the above findings. IMPRESSION: 1. No acute findings in the chest. Incidental note is made of aneurysmal disease of the thoracic aorta with maximal caliber of 4.7 cm at the level of the ascending thoracic aorta. Ascending thoracic aortic aneurysm. Recommend semi-annual imaging followup by CTA or MRA and referral to cardiothoracic surgery if not already obtained. This recommendation follows 2010 ACCF/AHA/AATS/ACR/ASA/SCA/SCAI/SIR/STS/SVM Guidelines for the Diagnosis and Management of Patients With Thoracic Aortic Disease. Circulation. 2010; 121: N462-V035 2. Coronary atherosclerosis with calcified plaque in a 3 vessel distribution. 3. New significant inflammatory process versus hemorrhage surrounding the descending duodenum in the retroperitoneum. This is likely causing relative obstruction of the duodenal lumen with resultant significant distention of the stomach and distal esophagus with fluid. The patient may benefit from nasogastric decompression of the stomach. It is somewhat unclear what the etiology of the retroperitoneal findings are. This may be on the basis of an inflammatory process such as duodenitis/ruptured duodenal ulcer versus retroperitoneal hemorrhage surrounding the duodenum. Pancreatitis involving the lateral inferior pancreatic head is felt to be much less likely given location of the dominant retroperitoneal abnormality. Electronically Signed   By: Aletta Edouard M.D.   On: 02/02/2018 21:05    Anti-infectives: Anti-infectives (From admission, onward)   Start     Dose/Rate Route Frequency Ordered Stop   02/03/18 0800  levofloxacin (LEVAQUIN) IVPB 750 mg  Status:   Discontinued     750 mg 100 mL/hr over 90 Minutes Intravenous Every 24 hours 02/02/18 1929 02/02/18 2343   02/03/18 0800  vancomycin (VANCOCIN) 1,250 mg in sodium chloride 0.9 % 250 mL IVPB  Status:  Discontinued     1,250 mg 166.7 mL/hr over 90 Minutes Intravenous Every 12 hours 02/02/18 1929 02/02/18 2343   02/03/18 0400  aztreonam (AZACTAM) 2 g in sodium chloride 0.9 % 100 mL IVPB  Status:  Discontinued     2 g 200 mL/hr over 30 Minutes Intravenous Every 8 hours 02/02/18 1929 02/02/18 2343   02/02/18 1930  vancomycin (VANCOCIN) 1,750 mg in sodium chloride 0.9 % 500 mL IVPB     1,750 mg 250 mL/hr over 120 Minutes Intravenous  Once 02/02/18 1909 02/02/18 2137   02/02/18 1915  levofloxacin (LEVAQUIN) IVPB 750 mg     750 mg 100 mL/hr over 90 Minutes Intravenous  Once 02/02/18 1902 02/02/18 2059   02/02/18 1915  aztreonam (AZACTAM) 2 g in sodium chloride 0.9 % 100 mL IVPB     2 g 200 mL/hr over 30 Minutes Intravenous  Once 02/02/18 1902 02/02/18 2136   02/02/18 1915  vancomycin (VANCOCIN) IVPB 1000 mg/200 mL premix  Status:  Discontinued     1,000 mg 200 mL/hr over 60 Minutes Intravenous  Once 02/02/18 1902 02/02/18 1909       Assessment/Plan CAD 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 Prostate cancer - s/p seed implant, on chemotx Anxiety HLD HTN  S/P laparoscopic appendectomy 5/10 - POD#13, incisions c/d/i Gastric outlet obstruction secondary to periduodenal hematoma - continue NGT decompression  - GI consult to consider endoscopy  -  may need some sort of feeding access - would be preferable to get enteral feeding over TPN - WBC 10.8, afebrile - no abx indicated at this time but would have a low threshold for starting - may have a few ice chips for comfort Hypokalemia - K 2.7, getting IV replacement  FEN: NPO, IVF; NGT to LIWS VTE: SCDs ID: [email or web address] 5/22  LOS: 1 day    Brigid Re , [facility] 02/03/2018, 7:58  AM Pager: [phone] Consults: [phone] Mon-Fri 7:00 am-4:30 pm Sat-Sun 7:00 am-11:30 am

## 2018-02-03 NOTE — Transfer of Care (Signed)
Immediate Anesthesia Transfer of Care Note  Patient: Tony [name]  Procedure(s) Performed: ESOPHAGOGASTRODUODENOSCOPY (EGD) WITH PROPOFOL (N/A )  Patient Location: Endoscopy Unit  Anesthesia Type:MAC  Level of Consciousness: awake and patient cooperative  Airway & Oxygen Therapy: Patient Spontanous Breathing and Patient connected to nasal cannula oxygen  Post-op Assessment: Report given to RN and Post -op Vital signs reviewed and stable  Post vital signs: Reviewed and stable  Last Vitals:  Vitals Value Taken Time  BP 159/138 02/03/2018  1:21 PM  Temp    Pulse 70 02/03/2018  1:21 PM  Resp 27 02/03/2018  1:21 PM  SpO2 94 % 02/03/2018  1:21 PM  Vitals shown include unvalidated device data.  Last Pain:  Vitals:   02/03/18 1143  TempSrc: Oral  PainSc:          Complications: No apparent anesthesia complications

## 2018-02-03 NOTE — Anesthesia Preprocedure Evaluation (Addendum)
Anesthesia Evaluation  Patient identified by MRN, date of birth, ID band Patient awake    Reviewed: Allergy & Precautions, NPO status , Patient's Chart, lab work & pertinent test results  Airway Mallampati: II  TM Distance: >3 FB Neck ROM: Full    Dental  (+) Dental Advisory Given, Edentulous Upper, Edentulous Lower   Pulmonary sleep apnea , COPD, former smoker,     + decreased breath sounds      Cardiovascular hypertension, Pt. on medications + CAD and + Peripheral Vascular Disease   Rhythm:Regular Rate:Normal     Neuro/Psych PSYCHIATRIC DISORDERS Anxiety    GI/Hepatic negative GI ROS, Neg liver ROS,   Endo/Other  negative endocrine ROS  Renal/GU negative Renal ROS     Musculoskeletal negative musculoskeletal ROS (+)   Abdominal (+) + obese,   Peds  Hematology negative hematology ROS (+)   Anesthesia Other Findings   Reproductive/Obstetrics negative OB ROS                          Anesthesia Physical Anesthesia Plan  ASA: II  Anesthesia Plan: MAC   Post-op Pain Management:    Induction: Intravenous  PONV Risk Score and Plan: Propofol infusion  Airway Management Planned: Nasal Cannula  Additional Equipment: None  Intra-op Plan:   Post-operative Plan:   Informed Consent: I have reviewed the patients History and Physical, chart, labs and discussed the procedure including the risks, benefits and alternatives for the proposed anesthesia with the patient or authorized representative who has indicated his/her understanding and acceptance.   Dental advisory given  Plan Discussed with: CRNA  Anesthesia Plan Comments:         Anesthesia Quick Evaluation

## 2018-02-03 NOTE — ED Notes (Signed)
IV team at bedside 

## 2018-02-03 NOTE — Consult Note (Signed)
Referring Provider: Dr. Karleen Hampshire Primary Care Physician:  [name], [name], MD Primary Gastroenterologist:  Althia Forts  Reason for Consultation:  Nausea/Vomiting; Gastric Outlet Obstruction  HPI: Tony [name] is a 77 y.o. male who is s/p lap appendectomy on 01/21/18 and had been d/c 01/24/18. He was eating without issues until 2 days ago when he had the acute onset of profuse nausea and vomiting along with diffuse abdominal pain and distention. Was unable to tolerate any POs for the last 2 days. Small episode of black vomitus yesterday but other vomiting episodes were not black and denies red vomitus. Denies melena or hematochezia. Denies any N/V like this episode in the past. Rare Aleve and denies other NSAIDs. Used to drink 2 beers per day until last month when he cut down due to recurrent heartburn. On oral chemotherapy and s/p radiation seeds for prostate cancer. Came to hospital with shortness of breath and N/V and CTA and CT showed severe distention of the stomach and inflammation around the distal descending portion of the duodenum and third part of the duodenum that per radiology was not present on a CT scan on 01/21/18. NG tube with over 2 L of dark bilious fluid removed thus far. Feels better with markedly less distention since placement of the NG tube. Wife at bedside.  Past Medical History:  Diagnosis Date  . Hypertension    no meds  . Prostate cancer ([place]) 02/08/14   Gleason 4+5=9, PSA 15.65  . Radiation   . Sinus problem     Past Surgical History:  Procedure Laterality Date  . APPENDECTOMY    . LAPAROSCOPIC APPENDECTOMY N/A 01/21/2018   Procedure: APPENDECTOMY LAPAROSCOPIC;  Surgeon: Kinsinger, Arta Bruce, MD;  Location: [place];  Service: General;  Laterality: N/A;  . PROSTATE BIOPSY  02/08/14   gleason 4+5=9, 12/12 cores positive, 54 gm  . RADIOACTIVE SEED IMPLANT N/A 06/29/2014   Procedure: RADIOACTIVE SEED IMPLANT;  Surgeon: Bernestine Amass, MD;  Location: [facility];  Service: Urology;  Laterality: N/A;    Prior to Admission medications   Medication Sig Start Date End Date Taking? Authorizing Provider  acetaminophen (TYLENOL) 325 MG tablet Take 2 tablets (650 mg total) by mouth every 6 (six) hours as needed for mild pain (or Fever >/= 101). 01/23/18  Yes Simaan, Darci Current, PA-C  amLODipine (NORVASC) 2.5 MG tablet Take 1 tablet (2.5 mg total) by mouth daily. 10/19/17  Yes [name], [name], MD  aspirin EC 81 MG tablet Take 81 mg by mouth daily.   Yes [provider]  fluticasone (FLONASE) 50 MCG/ACT nasal spray Place 1 spray into both nostrils daily as needed for allergies or rhinitis.   Yes [provider]  LORazepam (ATIVAN) 1 MG tablet TAKE 1/2 TO 1 TABLET DAILY AS NEEDED Patient taking differently: TAKE 1/2 TABLET BY MOUTH TWICE DAILY 11/08/17  Yes [name], [name], MD  Multiple Vitamin (MULTIVITAMIN WITH MINERALS) TABS tablet Take 1 tablet by mouth daily.   Yes [provider]  naproxen sodium (ALEVE) 220 MG tablet Take 220 mg by mouth 2 (two) times daily as needed (pain/headache).   Yes [provider]  polyethylene glycol (MIRALAX / GLYCOLAX) packet Take 17 g by mouth daily as needed for severe constipation. 01/24/18  Yes Meuth, Brooke A, PA-C  predniSONE (DELTASONE) 5 MG tablet Take 1 tablet (5 mg total) by mouth daily with breakfast. 11/16/17  Yes Shadad, Mathis Dad, MD  psyllium (METAMUCIL SMOOTH TEXTURE) 28 % packet  Take 1 packet by mouth 2 (two) times daily. Patient taking differently: Take 1 packet by mouth 2 (two) times daily as needed (constipation).  01/24/18 01/24/19 Yes Meuth, Brooke A, PA-C  traMADol (ULTRAM) 50 MG tablet Take 1 tablet (50 mg total) by mouth every 6 (six) hours as needed (mild pain). 01/24/18  Yes Meuth, Brooke A, PA-C  traZODone (DESYREL) 50 MG tablet Take 0.5-1 tablets (25-50 mg total) by mouth at bedtime as needed for sleep. Patient taking differently: Take 50 mg by mouth at bedtime.  10/19/17   Yes [name], [name], MD  ZYTIGA 250 MG tablet TAKE 4 TABLETS (1000 MG) BY MOUTH DAILY. TAKE ON AN EMPTY STOMACH 1 HOUR BEFORE OR 2 HOURS AFTER A MEAL 01/31/18  Yes Wyatt Portela, MD    Scheduled Meds: . guaiFENesin  600 mg Oral BID   Continuous Infusions: . potassium chloride 10 mEq (02/03/18 0846)   PRN Meds:.albuterol, fluticasone, ketorolac, LORazepam, ondansetron **OR** ondansetron (ZOFRAN) IV  Allergies as of 02/02/2018 - Review Complete 02/02/2018  Allergen Reaction Noted  . Penicillins Rash 02/20/2014    Family History  Problem Relation Age of Onset  . Alzheimer's disease Mother   . Alzheimer's disease Father   . Cancer Father        prostate  . Arthritis Sister   . Cancer Brother        prostate    Social History   Socioeconomic History  . Marital status: Married    Spouse name: Not on file  . Number of children: Not on file  . Years of education: Not on file  . Highest education level: Not on file  Occupational History  . Not on file  Social Needs  . Financial resource strain: Not on file  . Food insecurity:    Worry: Not on file    Inability: Not on file  . Transportation needs:    Medical: Not on file    Non-medical: Not on file  Tobacco Use  . Smoking status: Former Smoker    Years: 60.00    Types: Cigars    Last attempt to quit: 09/14/2012    Years since quitting: 5.3  . Smokeless tobacco: Never Used  . Tobacco comment: little cigars; the small ones; quit smoking 77 yo   Substance and Sexual Activity  . Alcohol use: Yes    Comment: 1-2 beer daily  . Drug use: No  . Sexual activity: Not Currently  Lifestyle  . Physical activity:    Days per week: Not on file    Minutes per session: Not on file  . Stress: Not on file  Relationships  . Social connections:    Talks on phone: Not on file    Gets together: Not on file    Attends religious service: Not on file    Active member of club or organization: Not on file    Attends meetings of clubs  or organizations: Not on file    Relationship status: Not on file  . Intimate partner violence:    Fear of current or ex partner: Not on file    Emotionally abused: Not on file    Physically abused: Not on file    Forced sexual activity: Not on file  Other Topics Concern  . Not on file  Social History Narrative  . Not on file    Review of Systems: All negative except as stated above in HPI.  Physical Exam: Vital signs: Vitals:   02/03/18  0820 02/03/18 0830  BP: (!) 170/99   Pulse:  65  Resp:  (!) 21  Temp:    SpO2:  96%  T 99.5   General:   Lethargic, elderly, mild acute distress, well-nourished Head: normocephalic, atraumatic Eyes: anicteric sclera ENT: oropharynx clear Neck: supple, nontender Lungs:  Clear throughout to auscultation.   No wheezes, crackles, or rhonchi. No acute distress. Heart:  Regular rate and rhythm; no murmurs, clicks, rubs,  or gallops. Abdomen: RLQ and RUQ tenderness with guarding, otherwise nontender, soft, nondistended, +BS, ecchymosis scattered on lower abdomen  Rectal:  Deferred Ext: no edema  GI:  Lab Results: Recent Labs    02/02/18 1752 02/03/18 0416  WBC 12.8* 10.8*  HGB 14.5 12.2*  HCT 42.7 36.1*  PLT 415* 310   BMET Recent Labs    02/02/18 1752 02/03/18 0416  NA 141 144  K 3.1* 2.7*  CL 102 106  CO2 25 27  GLUCOSE 158* 122*  BUN 20 19  CREATININE 1.04 0.91  CALCIUM 9.3 8.3*   LFT Recent Labs    02/02/18 1930  PROT 6.6  ALBUMIN 3.5  AST 20  ALT 15*  ALKPHOS 63  BILITOT 1.7*  BILIDIR 0.3  IBILI 1.4*   PT/INR No results for input(s): LABPROT, INR in the last 72 hours.   Studies/Results: Dg Chest 2 View  Result Date: 02/02/2018 CLINICAL DATA:  Short of breath EXAM: CHEST - 2 VIEW COMPARISON:  05/22/2014 FINDINGS: Hyperinflation with chronic bronchitic changes. No acute consolidation or effusion. Mild cardiomegaly. No pneumothorax. IMPRESSION: 1. Chronic appearing bronchitic changes without acute pulmonary  infiltrate 2. Cardiomegaly Electronically Signed   By: Donavan Foil M.D.   On: 02/02/2018 18:23   Ct Angio Chest Pe W And/or Wo Contrast  Result Date: 02/02/2018 CLINICAL DATA:  Shortness of breath tachypnea and vomiting. Laboratory evidence of sepsis. Recent laparoscopic appendectomy for acute appendicitis on 01/21/2018. EXAM: CT ANGIOGRAPHY CHEST CT ABDOMEN AND PELVIS WITH CONTRAST TECHNIQUE: Multidetector CT imaging of the chest was performed using the standard protocol during bolus administration of intravenous contrast. Multiplanar CT image reconstructions and MIPs were obtained to evaluate the vascular anatomy. Multidetector CT imaging of the abdomen and pelvis was performed using the standard protocol during bolus administration of intravenous contrast. CONTRAST:  100 mL ISOVUE-370 IOPAMIDOL (ISOVUE-370) INJECTION IV COMPARISON:  CT of the abdomen and pelvis on 01/21/2018 FINDINGS: CTA CHEST FINDINGS Cardiovascular: Satisfactory opacification of the pulmonary arteries to the segmental level. No evidence of pulmonary embolism. Normal heart size. No pericardial effusion. Coronary atherosclerosis evident with calcified plaque in a 3 vessel distribution. The aortic root is mildly dilated and measures 4.2 cm in diameter at the level of the sinuses of Valsalva. There is dilatation of the ascending thoracic aorta which measures 4.7 cm in greatest diameter. The aorta reaches a normal caliber of 3.7 cm at the level of the aortic arch. No evidence to suggest aortic dissection. Visualized proximal great vessels show normally patent origins. Mediastinum/Nodes: The esophagus is mildly distended and contains an air-fluid level in its mid segment and fluid in its distal segment. 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 No evidence of pulmonary consolidation, mass, pneumothorax, edema or pleural fluid. Musculoskeletal: No chest wall abnormality. No acute or significant  osseous findings. Review of the MIP images confirms the above findings. CT ABDOMEN and PELVIS FINDINGS Hepatobiliary: No focal liver abnormality is seen. No gallstones, gallbladder wall thickening, or biliary dilatation. Pancreas: Continued atrophic appearance of  the pancreas which is infiltrated with fat. Spleen: Normal in size without focal abnormality. Adrenals/Urinary Tract: Adrenal glands are unremarkable. Kidneys are normal, without renal calculi, focal lesion, or hydronephrosis. Bladder is unremarkable. Stomach/Bowel: There is thickening involving the distal descending portion of the duodenum as well as the third portion of the duodenum. This is associated with a significant large area of inflammation in the retroperitoneum with complex fluid present over an area measuring approximately 8 cm in transverse diameter and up to 13 cm in height. This was not present on the recent CT on 05/10. Differential considerations include inflammatory process of the duodenum such as ulcer disease with perforation and surrounding inflammation and also hemorrhage in this region. This is removed from the head of the pancreas and acute pancreatitis is felt much less likely to be a possibility for findings. There likely is some degree of narrowing of the duodenal lumen at this level as the proximal duodenum and stomach are distended and filled with fluid likely reflecting relative outlet obstruction. No evidence of extraluminal air. No small bowel or colonic dilatation is identified distal to the duodenum. No evidence of postoperative complication at the site of appendectomy. No focal abscess identified. Vascular/Lymphatic: No significant vascular findings are present. No enlarged abdominal or pelvic lymph nodes. Reproductive: Brachytherapy seeds are present in the prostate gland. Other: Bilateral inguinal hernias contain fat, right greater than left. Musculoskeletal: No acute or significant osseous findings. Review of the MIP  images confirms the above findings. IMPRESSION: 1. No acute findings in the chest. Incidental note is made of aneurysmal disease of the thoracic aorta with maximal caliber of 4.7 cm at the level of the ascending thoracic aorta. Ascending thoracic aortic aneurysm. Recommend semi-annual imaging followup by CTA or MRA and referral to cardiothoracic surgery if not already obtained. This recommendation follows 2010 ACCF/AHA/AATS/ACR/ASA/SCA/SCAI/SIR/STS/SVM Guidelines for the Diagnosis and Management of Patients With Thoracic Aortic Disease. Circulation. 2010; 121: E831-D176 2. Coronary atherosclerosis with calcified plaque in a 3 vessel distribution. 3. New significant inflammatory process versus hemorrhage surrounding the descending duodenum in the retroperitoneum. This is likely causing relative obstruction of the duodenal lumen with resultant significant distention of the stomach and distal esophagus with fluid. The patient may benefit from nasogastric decompression of the stomach. It is somewhat unclear what the etiology of the retroperitoneal findings are. This may be on the basis of an inflammatory process such as duodenitis/ruptured duodenal ulcer versus retroperitoneal hemorrhage surrounding the duodenum. Pancreatitis involving the lateral inferior pancreatic head is felt to be much less likely given location of the dominant retroperitoneal abnormality. Electronically Signed   By: Aletta Edouard M.D.   On: 02/02/2018 21:05   Ct Abdomen Pelvis W Contrast  Result Date: 02/02/2018 CLINICAL DATA:  Shortness of breath tachypnea and vomiting. Laboratory evidence of sepsis. Recent laparoscopic appendectomy for acute appendicitis on 01/21/2018. EXAM: CT ANGIOGRAPHY CHEST CT ABDOMEN AND PELVIS WITH CONTRAST TECHNIQUE: Multidetector CT imaging of the chest was performed using the standard protocol during bolus administration of intravenous contrast. Multiplanar CT image reconstructions and MIPs were obtained to  evaluate the vascular anatomy. Multidetector CT imaging of the abdomen and pelvis was performed using the standard protocol during bolus administration of intravenous contrast. CONTRAST:  100 mL ISOVUE-370 IOPAMIDOL (ISOVUE-370) INJECTION IV COMPARISON:  CT of the abdomen and pelvis on 01/21/2018 FINDINGS: CTA CHEST FINDINGS Cardiovascular: Satisfactory opacification of the pulmonary arteries to the segmental level. No evidence of pulmonary embolism. Normal heart size. No pericardial effusion. Coronary atherosclerosis evident with  calcified plaque in a 3 vessel distribution. The aortic root is mildly dilated and measures 4.2 cm in diameter at the level of the sinuses of Valsalva. There is dilatation of the ascending thoracic aorta which measures 4.7 cm in greatest diameter. The aorta reaches a normal caliber of 3.7 cm at the level of the aortic arch. No evidence to suggest aortic dissection. Visualized proximal great vessels show normally patent origins. Mediastinum/Nodes: The esophagus is mildly distended and contains an air-fluid level in its mid segment and fluid in its distal segment. 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 No evidence of pulmonary consolidation, mass, pneumothorax, edema or pleural fluid. Musculoskeletal: No chest wall abnormality. No acute or significant osseous findings. Review of the MIP images confirms the above findings. CT ABDOMEN and PELVIS FINDINGS Hepatobiliary: No focal liver abnormality is seen. No gallstones, gallbladder wall thickening, or biliary dilatation. Pancreas: Continued atrophic appearance of the pancreas which is infiltrated with fat. Spleen: Normal in size without focal abnormality. Adrenals/Urinary Tract: Adrenal glands are unremarkable. Kidneys are normal, without renal calculi, focal lesion, or hydronephrosis. Bladder is unremarkable. Stomach/Bowel: There is thickening involving the distal descending portion of the  duodenum as well as the third portion of the duodenum. This is associated with a significant large area of inflammation in the retroperitoneum with complex fluid present over an area measuring approximately 8 cm in transverse diameter and up to 13 cm in height. This was not present on the recent CT on 05/10. Differential considerations include inflammatory process of the duodenum such as ulcer disease with perforation and surrounding inflammation and also hemorrhage in this region. This is removed from the head of the pancreas and acute pancreatitis is felt much less likely to be a possibility for findings. There likely is some degree of narrowing of the duodenal lumen at this level as the proximal duodenum and stomach are distended and filled with fluid likely reflecting relative outlet obstruction. No evidence of extraluminal air. No small bowel or colonic dilatation is identified distal to the duodenum. No evidence of postoperative complication at the site of appendectomy. No focal abscess identified. Vascular/Lymphatic: No significant vascular findings are present. No enlarged abdominal or pelvic lymph nodes. Reproductive: Brachytherapy seeds are present in the prostate gland. Other: Bilateral inguinal hernias contain fat, right greater than left. Musculoskeletal: No acute or significant osseous findings. Review of the MIP images confirms the above findings. IMPRESSION: 1. No acute findings in the chest. Incidental note is made of aneurysmal disease of the thoracic aorta with maximal caliber of 4.7 cm at the level of the ascending thoracic aorta. Ascending thoracic aortic aneurysm. Recommend semi-annual imaging followup by CTA or MRA and referral to cardiothoracic surgery if not already obtained. This recommendation follows 2010 ACCF/AHA/AATS/ACR/ASA/SCA/SCAI/SIR/STS/SVM Guidelines for the Diagnosis and Management of Patients With Thoracic Aortic Disease. Circulation. 2010; 121: Z169-C789 2. Coronary  atherosclerosis with calcified plaque in a 3 vessel distribution. 3. New significant inflammatory process versus hemorrhage surrounding the descending duodenum in the retroperitoneum. This is likely causing relative obstruction of the duodenal lumen with resultant significant distention of the stomach and distal esophagus with fluid. The patient may benefit from nasogastric decompression of the stomach. It is somewhat unclear what the etiology of the retroperitoneal findings are. This may be on the basis of an inflammatory process such as duodenitis/ruptured duodenal ulcer versus retroperitoneal hemorrhage surrounding the duodenum. Pancreatitis involving the lateral inferior pancreatic head is felt to be much less likely given location of the  dominant retroperitoneal abnormality. Electronically Signed   By: Aletta Edouard M.D.   On: 02/02/2018 21:05    Impression/Plan: Gastric outlet obstruction with periduodenal inflammation vs hematoma in the descending portion of the duodenum - unclear source. Surgery does not feel this is a perforation. Risks/benefits of EGD were discussed and he agrees to proceed. EGD today by Dr. Watt Climes to further evaluate. Continue NGT with suctioning. Supportive care. Will follow.    LOS: 1 day   Venus C.  02/03/2018, 8:58 AM  Questions please call [phone]

## 2018-02-04 DIAGNOSIS — I10 Essential (primary) hypertension: Secondary | ICD-10-CM

## 2018-02-04 DIAGNOSIS — E7800 Pure hypercholesterolemia, unspecified: Secondary | ICD-10-CM

## 2018-02-04 LAB — BASIC METABOLIC PANEL
ANION GAP: 9 (ref 5–15)
BUN: 16 mg/dL (ref 6–20)
CALCIUM: 8.2 mg/dL — AB (ref 8.9–10.3)
CO2: 24 mmol/L (ref 22–32)
CREATININE: 0.73 mg/dL (ref 0.61–1.24)
Chloride: 108 mmol/L (ref 101–111)
GLUCOSE: 100 mg/dL — AB (ref 65–99)
Potassium: 2.7 mmol/L — CL (ref 3.5–5.1)
Sodium: 141 mmol/L (ref 135–145)

## 2018-02-04 LAB — POTASSIUM: POTASSIUM: 3.1 mmol/L — AB (ref 3.5–5.1)

## 2018-02-04 LAB — CBC
HCT: 37.6 % — ABNORMAL LOW (ref 39.0–52.0)
HEMOGLOBIN: 12.3 g/dL — AB (ref 13.0–17.0)
MCH: 31.2 pg (ref 26.0–34.0)
MCHC: 32.7 g/dL (ref 30.0–36.0)
MCV: 95.4 fL (ref 78.0–100.0)
PLATELETS: 276 10*3/uL (ref 150–400)
RBC: 3.94 MIL/uL — ABNORMAL LOW (ref 4.22–5.81)
RDW: 13.1 % (ref 11.5–15.5)
WBC: 9.6 10*3/uL (ref 4.0–10.5)

## 2018-02-04 LAB — MAGNESIUM: MAGNESIUM: 1.8 mg/dL (ref 1.7–2.4)

## 2018-02-04 MED ORDER — POTASSIUM CHLORIDE CRYS ER 20 MEQ PO TBCR
40.0000 meq | EXTENDED_RELEASE_TABLET | Freq: Once | ORAL | Status: AC
  Administered 2018-02-04: 40 meq via ORAL
  Filled 2018-02-04: qty 2

## 2018-02-04 MED ORDER — ABIRATERONE ACETATE 250 MG PO TABS
1000.0000 mg | ORAL_TABLET | Freq: Every day | ORAL | Status: DC
  Administered 2018-02-04 – 2018-02-09 (×6): 1000 mg via ORAL
  Filled 2018-02-04 (×6): qty 4

## 2018-02-04 MED ORDER — POTASSIUM CHLORIDE 10 MEQ/100ML IV SOLN
10.0000 meq | INTRAVENOUS | Status: AC
  Administered 2018-02-04 (×4): 10 meq via INTRAVENOUS
  Filled 2018-02-04 (×4): qty 100

## 2018-02-04 NOTE — Progress Notes (Signed)
PROGRESS NOTE    Sadiq Mccauley Popelka  WSF:[id number] DOB: November 08, 1940 DOA: 02/02/2018 PCP: [name], [name], MD    Brief Narrative: Coley Kulikowski Bullinsis a 77 y.o.malewith medical history significant offundectomy 12 days ago for perforated appendix, hypertension, prostate cancer is post radioactive seed implant but on oral chemotherapy, who presents at the behest of his primary care practitioner for sob, nausea and vomiting. Surgery and GI consulted. Pt underwent EGD showing reflux and erosive esophagitis and diffuse gastritis and duodenitis in the first part of duodenum.     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  Appreciate GI and surgery recommendations.  No nausea or vomiting so far.  Repeat ABD x ray to evaluate further.    GOO  - possibly from duodenal edema.    Malignant neoplasm of the prostate: Further management as per Dr [name].  Continue with zytiga.    Hypertension; well controlled.    Copd: No wheezing heard.   Mild anemia:  Stable.    Severe hypokalemia:  - replaced. Repeat tonight.    DVT prophylaxis: scd's Code Status: full code.  Family Communication:family at bedside.  Disposition Plan: home when able to tolerate soft diet.    Consultants:   Gastroenterology   Surgery.    Procedures: EGD   Antimicrobials: none.    Subjective: No nausea or vomiting on clear liquid diet.   Objective: Vitals:   02/03/18 1847 02/03/18 2136 02/04/18 0611 02/04/18 1526  BP: (!) 149/98 130/89 (!) 152/98 (!) 153/98    Pulse: 94 78 70 72  Resp:  18 18   Temp: (!) 97.4 F (36.3 C) 99.3 F (37.4 C) 98.8 F (37.1 C) 98.9 F (37.2 C)  TempSrc: Oral Oral  Oral  SpO2: 92% 94% 93% 90%  Weight:      Height:        Intake/Output Summary (Last 24 hours) at 02/04/2018 1654 Last data filed at 02/04/2018 1502 Gross per 24 hour  Intake 1449.67 ml  Output 200 ml  Net 1249.67 ml   Filed Weights   02/02/18 1741 02/02/18 1941 02/03/18 1142  Weight: 90.7 kg (200 lb) 90.7 kg (200 lb) 90.7 kg (200 lb)    Examination:  General exam: Appears calm and comfortable  Respiratory system: Clear to auscultation. Respiratory effort normal. Cardiovascular system: S1 & S2 heard, RRR. No JVD, murmurs, rubs, gallops or clicks. No pedal edema. Gastrointestinal system: Abdomen is nondistended, soft and nontender. No organomegaly or masses felt. Normal bowel sounds heard. Central nervous system: Alert and oriented. No focal neurological deficits. Extremities: Symmetric 5 x 5 power. Skin: No rashes, lesions or ulcers Psychiatry: Judgement and insight appear normal. Mood & affect appropriate.     Data Reviewed: I have personally reviewed following labs and imaging studies  CBC: Recent Labs  Lab 02/02/18 1752 02/03/18 0416 02/04/18 0917  WBC 12.8* 10.8* 9.6  HGB 14.5 12.2* 12.3*  HCT 42.7 36.1* 37.6*  MCV 92.2 92.6 95.4  PLT 415* 310 017   Basic Metabolic Panel: Recent Labs  Lab 02/02/18 1752 02/03/18 0416 02/04/18 4944  NA 141 144 141  K 3.1* 2.7* 2.7*  CL 102 106 108  CO2 25 27 24   GLUCOSE 158* 122* 100*  BUN 20 19 16   CREATININE 1.04 0.91 0.73  CALCIUM 9.3 8.3* 8.2*  MG  --   --  1.8   GFR: Estimated Creatinine Clearance: 82.6 mL/min (by C-G formula based on SCr of 0.73 mg/dL). Liver Function Tests: Recent Labs  Lab 02/02/18 1930  AST 20  ALT 15*  ALKPHOS 63  BILITOT 1.7*  PROT 6.6  ALBUMIN 3.5   No results for input(s): LIPASE, AMYLASE in the last 168 hours. No results for input(s):  AMMONIA in the last 168 hours. Coagulation Profile: No results for input(s): INR, PROTIME in the last 168 hours. Cardiac Enzymes: No results for input(s): CKTOTAL, CKMB, CKMBINDEX, TROPONINI in the last 168 hours. BNP (last 3 results) No results for input(s): PROBNP in the last 8760 hours. HbA1C: No results for input(s): HGBA1C in the last 72 hours. CBG: No results for input(s): GLUCAP in the last 168 hours. Lipid Profile: No results for input(s): CHOL, HDL, LDLCALC, TRIG, CHOLHDL, LDLDIRECT in the last 72 hours. Thyroid Function Tests: No results for input(s): TSH, T4TOTAL, FREET4, T3FREE, THYROIDAB in the last 72 hours. Anemia Panel: No results for input(s): VITAMINB12, FOLATE, FERRITIN, TIBC, IRON, RETICCTPCT in the last 72 hours. Sepsis Labs: Recent Labs  Lab 02/02/18 1828 02/02/18 1947 02/03/18 0417  LATICACIDVEN 2.59* 2.20* 1.3    Recent Results (from the past 240 hour(s))  Blood culture (routine x 2)     Status: None (Preliminary result)   Collection Time: 02/02/18  7:30 PM  Result Value Ref Range Status   Specimen Description BLOOD LEFT ARM  Final   Special Requests   Final    BOTTLES DRAWN AEROBIC AND ANAEROBIC Blood Culture adequate volume   Culture   Final    NO GROWTH 2 DAYS Performed at [facility], [place] [address], [place], [place] [address]    Report Status PENDING  Incomplete  Blood culture (routine x 2)     Status: None (Preliminary result)   Collection Time: 02/02/18  7:33 PM  Result Value Ref Range Status   Specimen Description BLOOD RIGHT HAND  Final   Special Requests   Final    BOTTLES DRAWN AEROBIC AND ANAEROBIC Blood Culture adequate volume   Culture   Final    NO GROWTH 2 DAYS Performed at [facility], [place] [address], [place], [place] [address]    Report Status PENDING  Incomplete         Radiology Studies: Dg Chest 2 View  Result Date: 02/02/2018 CLINICAL DATA:  Short of breath EXAM: CHEST - 2 VIEW COMPARISON:   05/22/2014 FINDINGS: Hyperinflation with chronic bronchitic changes. No acute consolidation or effusion. Mild cardiomegaly. No pneumothorax. IMPRESSION: 1. Chronic appearing bronchitic changes without acute pulmonary infiltrate 2. Cardiomegaly Electronically Signed   By: Donavan Foil M.D.   On: 02/02/2018 18:23   Ct Angio Chest Pe W And/or Wo Contrast  Result Date: 02/02/2018 CLINICAL DATA:  Shortness of breath tachypnea and vomiting. Laboratory evidence of sepsis. Recent laparoscopic appendectomy for acute appendicitis on 01/21/2018. EXAM: CT ANGIOGRAPHY CHEST CT ABDOMEN AND PELVIS WITH CONTRAST TECHNIQUE: Multidetector CT imaging of the chest was performed using the standard protocol during bolus administration of intravenous contrast. Multiplanar CT image reconstructions and MIPs were obtained to evaluate the vascular anatomy. Multidetector CT imaging of the abdomen and pelvis was performed using  the standard protocol during bolus administration of intravenous contrast. CONTRAST:  100 mL ISOVUE-370 IOPAMIDOL (ISOVUE-370) INJECTION IV COMPARISON:  CT of the abdomen and pelvis on 01/21/2018 FINDINGS: CTA CHEST FINDINGS Cardiovascular: Satisfactory opacification of the pulmonary arteries to the segmental level. No evidence of pulmonary embolism. Normal heart size. No pericardial effusion. Coronary atherosclerosis evident with calcified plaque in a 3 vessel distribution. The aortic root is mildly dilated and measures 4.2 cm in diameter at the level of the sinuses of Valsalva. There is dilatation of the ascending thoracic aorta which measures 4.7 cm in greatest diameter. The aorta reaches a normal caliber of 3.7 cm at the level of the aortic arch. No evidence to suggest aortic dissection. Visualized proximal great vessels show normally patent origins. Mediastinum/Nodes: The esophagus is mildly distended and contains an air-fluid level in its mid segment and fluid in its distal segment. Lungs/Pleura: Emphysematous  lung disease present predominantly in the upper lung zones. No evidence of pulmonary consolidation, mass, pneumothorax, edema or pleural fluid. Musculoskeletal: No chest wall abnormality. No acute or significant osseous findings. Review of the MIP images confirms the above findings. CT ABDOMEN and PELVIS FINDINGS Hepatobiliary: No focal liver abnormality is seen. No gallstones, gallbladder wall thickening, or biliary dilatation. Pancreas: Continued atrophic appearance of the pancreas which is infiltrated with fat. Spleen: Normal in size without focal abnormality. Adrenals/Urinary Tract: Adrenal glands are unremarkable. Kidneys are normal, without renal calculi, focal lesion, or hydronephrosis. Bladder is unremarkable. Stomach/Bowel: There is thickening involving the distal descending portion of the duodenum as well as the third portion of the duodenum. This is associated with a significant large area of inflammation in the retroperitoneum with complex fluid present over an area measuring approximately 8 cm in transverse diameter and up to 13 cm in height. This was not present on the recent CT on 05/10. Differential considerations include inflammatory process of the duodenum such as ulcer disease with perforation and surrounding inflammation and also hemorrhage in this region. This is removed from the head of the pancreas and acute pancreatitis is felt much less likely to be a possibility for findings. There likely is some degree of narrowing of the duodenal lumen at this level as the proximal duodenum and stomach are distended and filled with fluid likely reflecting relative outlet obstruction. No evidence of extraluminal air. No small bowel or colonic dilatation is identified distal to the duodenum. No evidence of postoperative complication at the site of appendectomy. No focal abscess identified. Vascular/Lymphatic: No significant vascular findings are present. No enlarged abdominal or pelvic lymph nodes.  Reproductive: Brachytherapy seeds are present in the prostate gland. Other: Bilateral inguinal hernias contain fat, right greater than left. Musculoskeletal: No acute or significant osseous findings. Review of the MIP images confirms the above findings. IMPRESSION: 1. No acute findings in the chest. Incidental note is made of aneurysmal disease of the thoracic aorta with maximal caliber of 4.7 cm at the level of the ascending thoracic aorta. Ascending thoracic aortic aneurysm. Recommend semi-annual imaging followup by CTA or MRA and referral to cardiothoracic surgery if not already obtained. This recommendation follows 2010 ACCF/AHA/AATS/ACR/ASA/SCA/SCAI/SIR/STS/SVM Guidelines for the Diagnosis and Management of Patients With Thoracic Aortic Disease. Circulation. 2010; 121: A250-N397 2. Coronary atherosclerosis with calcified plaque in a 3 vessel distribution. 3. New significant inflammatory process versus hemorrhage surrounding the descending duodenum in the retroperitoneum. This is likely causing relative obstruction of the duodenal lumen with resultant significant distention of the stomach and distal esophagus with fluid. The patient may  benefit from nasogastric decompression of the stomach. It is somewhat unclear what the etiology of the retroperitoneal findings are. This may be on the basis of an inflammatory process such as duodenitis/ruptured duodenal ulcer versus retroperitoneal hemorrhage surrounding the duodenum. Pancreatitis involving the lateral inferior pancreatic head is felt to be much less likely given location of the dominant retroperitoneal abnormality. Electronically Signed   By: Aletta Edouard M.D.   On: 02/02/2018 21:05   Ct Abdomen Pelvis W Contrast  Result Date: 02/02/2018 CLINICAL DATA:  Shortness of breath tachypnea and vomiting. Laboratory evidence of sepsis. Recent laparoscopic appendectomy for acute appendicitis on 01/21/2018. EXAM: CT ANGIOGRAPHY CHEST CT ABDOMEN AND PELVIS WITH  CONTRAST TECHNIQUE: Multidetector CT imaging of the chest was performed using the standard protocol during bolus administration of intravenous contrast. Multiplanar CT image reconstructions and MIPs were obtained to evaluate the vascular anatomy. Multidetector CT imaging of the abdomen and pelvis was performed using the standard protocol during bolus administration of intravenous contrast. CONTRAST:  100 mL ISOVUE-370 IOPAMIDOL (ISOVUE-370) INJECTION IV COMPARISON:  CT of the abdomen and pelvis on 01/21/2018 FINDINGS: CTA CHEST FINDINGS Cardiovascular: Satisfactory opacification of the pulmonary arteries to the segmental level. No evidence of pulmonary embolism. Normal heart size. No pericardial effusion. Coronary atherosclerosis evident with calcified plaque in a 3 vessel distribution. The aortic root is mildly dilated and measures 4.2 cm in diameter at the level of the sinuses of Valsalva. There is dilatation of the ascending thoracic aorta which measures 4.7 cm in greatest diameter. The aorta reaches a normal caliber of 3.7 cm at the level of the aortic arch. No evidence to suggest aortic dissection. Visualized proximal great vessels show normally patent origins. Mediastinum/Nodes: The esophagus is mildly distended and contains an air-fluid level in its mid segment and fluid in its distal segment. 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 No evidence of pulmonary consolidation, mass, pneumothorax, edema or pleural fluid. Musculoskeletal: No chest wall abnormality. No acute or significant osseous findings. Review of the MIP images confirms the above findings. CT ABDOMEN and PELVIS FINDINGS Hepatobiliary: No focal liver abnormality is seen. No gallstones, gallbladder wall thickening, or biliary dilatation. Pancreas: Continued atrophic appearance of the pancreas which is infiltrated with fat. Spleen: Normal in size without focal abnormality. Adrenals/Urinary Tract:  Adrenal glands are unremarkable. Kidneys are normal, without renal calculi, focal lesion, or hydronephrosis. Bladder is unremarkable. Stomach/Bowel: There is thickening involving the distal descending portion of the duodenum as well as the third portion of the duodenum. This is associated with a significant large area of inflammation in the retroperitoneum with complex fluid present over an area measuring approximately 8 cm in transverse diameter and up to 13 cm in height. This was not present on the recent CT on 05/10. Differential considerations include inflammatory process of the duodenum such as ulcer disease with perforation and surrounding inflammation and also hemorrhage in this region. This is removed from the head of the pancreas and acute pancreatitis is felt much less likely to be a possibility for findings. There likely is some degree of narrowing of the duodenal lumen at this level as the proximal duodenum and stomach are distended and filled with fluid likely reflecting relative outlet obstruction. No evidence of extraluminal air. No small bowel or colonic dilatation is identified distal to the duodenum. No evidence of postoperative complication at the site of appendectomy. No focal abscess identified. Vascular/Lymphatic: No significant vascular findings are present. No enlarged abdominal or pelvic lymph nodes.  Reproductive: Brachytherapy seeds are present in the prostate gland. Other: Bilateral inguinal hernias contain fat, right greater than left. Musculoskeletal: No acute or significant osseous findings. Review of the MIP images confirms the above findings. IMPRESSION: 1. No acute findings in the chest. Incidental note is made of aneurysmal disease of the thoracic aorta with maximal caliber of 4.7 cm at the level of the ascending thoracic aorta. Ascending thoracic aortic aneurysm. Recommend semi-annual imaging followup by CTA or MRA and referral to cardiothoracic surgery if not already obtained.  This recommendation follows 2010 ACCF/AHA/AATS/ACR/ASA/SCA/SCAI/SIR/STS/SVM Guidelines for the Diagnosis and Management of Patients With Thoracic Aortic Disease. Circulation. 2010; 121: C166-A630 2. Coronary atherosclerosis with calcified plaque in a 3 vessel distribution. 3. New significant inflammatory process versus hemorrhage surrounding the descending duodenum in the retroperitoneum. This is likely causing relative obstruction of the duodenal lumen with resultant significant distention of the stomach and distal esophagus with fluid. The patient may benefit from nasogastric decompression of the stomach. It is somewhat unclear what the etiology of the retroperitoneal findings are. This may be on the basis of an inflammatory process such as duodenitis/ruptured duodenal ulcer versus retroperitoneal hemorrhage surrounding the duodenum. Pancreatitis involving the lateral inferior pancreatic head is felt to be much less likely given location of the dominant retroperitoneal abnormality. Electronically Signed   By: Aletta Edouard M.D.   On: 02/02/2018 21:05        Scheduled Meds: . abiraterone acetate  1,000 mg Oral QAC breakfast  . guaiFENesin  600 mg Oral BID  . pantoprazole  40 mg Oral BID   Continuous Infusions: . sodium chloride 20 mL/hr at 02/03/18 1431     LOS: 2 days    Time spent: 35 minutes.     Hosie Poisson, MD [facility] Pager [phone]   If 7PM-7AM, please contact night-coverage [email or web address] Password [facility] 02/04/2018, 4:54 PM

## 2018-02-04 NOTE — Progress Notes (Signed)
Tony [name] 10:16 AM  Subjective: Patient doing well without any post endoscopy problems and tolerating liquid diet and able to take his pills and did have a bowel movement and no new complaints and case discussed with surgical team  Objective: Vital signs stable afebrile sitting comfortably in the chair no acute distress in good spirits abdomen is soft nontender occasional bowel sounds no obvious succussion splash CBC okay chemistries pending  Assessment: Gastric outlet obstruction secondary to duodenal edema questionable etiology  Plan: Agree with liquids only for a few more days and then either repeat x-ray or trial of advancement and will need a repeat x-ray at some point and care with aspirin and nonsteroidals going forward  [facility]  Pager [phone] After 5PM or if no answer call [phone]

## 2018-02-04 NOTE — Progress Notes (Signed)
[facility]/Tony [name] Progress Note 1 Day Post-Op  Subjective: Patient doing very well this AM.  Tolerated clear liquids well  Objective: Vital signs in last 24 hours: Temp:  [97.4 F (36.3 C)-99.3 F (37.4 C)] 98.8 F (37.1 C) (05/24 0611) Pulse Rate:  [61-94] 70 (05/24 0611) Resp:  [16-96] 18 (05/24 0611) BP: (113-152)/(65-98) 152/98 (05/24 0611) SpO2:  [92 %-99 %] 93 % (05/24 0611) Weight:  [90.7 kg (200 lb)] 90.7 kg (200 lb) (05/23 1142) Last BM Date: 02/03/18  Intake/Output from previous day: 05/23 0701 - 05/24 0700 In: 971.2 [I.V.:571.2; IV Piggyback:400] Out: -  Intake/Output this shift: No intake/output data recorded.  General: Sitting up in chair in no distress  Lungs: Clear  Abd: Soft, good bowel sounds.  Not tender  Extremities: No clinical signs or symptoms of DVT  Neuro: Intact  Lab Results:  @LABLAST2 (wbc:2,hgb:2,hct:2,plt:2) BMET ) Recent Labs    02/02/18 1752 02/03/18 0416  NA 141 144  K 3.1* 2.7*  CL 102 106  CO2 25 27  GLUCOSE 158* 122*  BUN 20 19  CREATININE 1.04 0.91  CALCIUM 9.3 8.3*   PT/INR No results for input(s): LABPROT, INR in the last 72 hours. ABG No results for input(s): PHART, HCO3 in the last 72 hours.  Invalid input(s): PCO2, PO2  Studies/Results: Dg Chest 2 View  Result Date: 02/02/2018 CLINICAL DATA:  Short of breath EXAM: CHEST - 2 VIEW COMPARISON:  05/22/2014 FINDINGS: Hyperinflation with chronic bronchitic changes. No acute consolidation or effusion. Mild cardiomegaly. No pneumothorax. IMPRESSION: 1. Chronic appearing bronchitic changes without acute pulmonary infiltrate 2. Cardiomegaly Electronically Signed   By: Donavan Foil M.D.   On: 02/02/2018 18:23   Ct Angio Chest Pe W And/or Wo Contrast  Result Date: 02/02/2018 CLINICAL DATA:  Shortness of breath tachypnea and vomiting. Laboratory evidence of sepsis. Recent laparoscopic appendectomy for acute appendicitis on 01/21/2018. EXAM: CT ANGIOGRAPHY CHEST CT ABDOMEN AND  PELVIS WITH CONTRAST TECHNIQUE: Multidetector CT imaging of the chest was performed using the standard protocol during bolus administration of intravenous contrast. Multiplanar CT image reconstructions and MIPs were obtained to evaluate the vascular anatomy. Multidetector CT imaging of the abdomen and pelvis was performed using the standard protocol during bolus administration of intravenous contrast. CONTRAST:  100 mL ISOVUE-370 IOPAMIDOL (ISOVUE-370) INJECTION IV COMPARISON:  CT of the abdomen and pelvis on 01/21/2018 FINDINGS: CTA CHEST FINDINGS Cardiovascular: Satisfactory opacification of the pulmonary arteries to the segmental level. No evidence of pulmonary embolism. Normal heart size. No pericardial effusion. Coronary atherosclerosis evident with calcified plaque in a 3 vessel distribution. The aortic root is mildly dilated and measures 4.2 cm in diameter at the level of the sinuses of Valsalva. There is dilatation of the ascending thoracic aorta which measures 4.7 cm in greatest diameter. The aorta reaches a normal caliber of 3.7 cm at the level of the aortic arch. No evidence to suggest aortic dissection. Visualized proximal great vessels show normally patent origins. Mediastinum/Nodes: The esophagus is mildly distended and contains an air-fluid level in its mid segment and fluid in its distal segment. 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 No evidence of pulmonary consolidation, mass, pneumothorax, edema or pleural fluid. Musculoskeletal: No chest wall abnormality. No acute or significant osseous findings. Review of the MIP images confirms the above findings. CT ABDOMEN and PELVIS FINDINGS Hepatobiliary: No focal liver abnormality is seen. No gallstones, gallbladder wall thickening, or biliary dilatation. Pancreas: Continued atrophic appearance of the pancreas which is infiltrated with  fat. Spleen: Normal in size without focal abnormality. Adrenals/Urinary  Tract: Adrenal glands are unremarkable. Kidneys are normal, without renal calculi, focal lesion, or hydronephrosis. Bladder is unremarkable. Stomach/Bowel: There is thickening involving the distal descending portion of the duodenum as well as the third portion of the duodenum. This is associated with a significant large area of inflammation in the retroperitoneum with complex fluid present over an area measuring approximately 8 cm in transverse diameter and up to 13 cm in height. This was not present on the recent CT on 05/10. Differential considerations include inflammatory process of the duodenum such as ulcer disease with perforation and surrounding inflammation and also hemorrhage in this region. This is removed from the head of the pancreas and acute pancreatitis is felt much less likely to be a possibility for findings. There likely is some degree of narrowing of the duodenal lumen at this level as the proximal duodenum and stomach are distended and filled with fluid likely reflecting relative outlet obstruction. No evidence of extraluminal air. No small bowel or colonic dilatation is identified distal to the duodenum. No evidence of postoperative complication at the site of appendectomy. No focal abscess identified. Vascular/Lymphatic: No significant vascular findings are present. No enlarged abdominal or pelvic lymph nodes. Reproductive: Brachytherapy seeds are present in the prostate gland. Other: Bilateral inguinal hernias contain fat, right greater than left. Musculoskeletal: No acute or significant osseous findings. Review of the MIP images confirms the above findings. IMPRESSION: 1. No acute findings in the chest. Incidental note is made of aneurysmal disease of the thoracic aorta with maximal caliber of 4.7 cm at the level of the ascending thoracic aorta. Ascending thoracic aortic aneurysm. Recommend semi-annual imaging followup by CTA or MRA and referral to cardiothoracic surgery if not already  obtained. This recommendation follows 2010 ACCF/AHA/AATS/ACR/ASA/SCA/SCAI/SIR/STS/SVM Guidelines for the Diagnosis and Management of Patients With Thoracic Aortic Disease. Circulation. 2010; 121: T614-E315 2. Coronary atherosclerosis with calcified plaque in a 3 vessel distribution. 3. New significant inflammatory process versus hemorrhage surrounding the descending duodenum in the retroperitoneum. This is likely causing relative obstruction of the duodenal lumen with resultant significant distention of the stomach and distal esophagus with fluid. The patient may benefit from nasogastric decompression of the stomach. It is somewhat unclear what the etiology of the retroperitoneal findings are. This may be on the basis of an inflammatory process such as duodenitis/ruptured duodenal ulcer versus retroperitoneal hemorrhage surrounding the duodenum. Pancreatitis involving the lateral inferior pancreatic head is felt to be much less likely given location of the dominant retroperitoneal abnormality. Electronically Signed   By: Aletta Edouard M.D.   On: 02/02/2018 21:05   Ct Abdomen Pelvis W Contrast  Result Date: 02/02/2018 CLINICAL DATA:  Shortness of breath tachypnea and vomiting. Laboratory evidence of sepsis. Recent laparoscopic appendectomy for acute appendicitis on 01/21/2018. EXAM: CT ANGIOGRAPHY CHEST CT ABDOMEN AND PELVIS WITH CONTRAST TECHNIQUE: Multidetector CT imaging of the chest was performed using the standard protocol during bolus administration of intravenous contrast. Multiplanar CT image reconstructions and MIPs were obtained to evaluate the vascular anatomy. Multidetector CT imaging of the abdomen and pelvis was performed using the standard protocol during bolus administration of intravenous contrast. CONTRAST:  100 mL ISOVUE-370 IOPAMIDOL (ISOVUE-370) INJECTION IV COMPARISON:  CT of the abdomen and pelvis on 01/21/2018 FINDINGS: CTA CHEST FINDINGS Cardiovascular: Satisfactory opacification of the  pulmonary arteries to the segmental level. No evidence of pulmonary embolism. Normal heart size. No pericardial effusion. Coronary atherosclerosis evident with calcified plaque in a 3  vessel distribution. The aortic root is mildly dilated and measures 4.2 cm in diameter at the level of the sinuses of Valsalva. There is dilatation of the ascending thoracic aorta which measures 4.7 cm in greatest diameter. The aorta reaches a normal caliber of 3.7 cm at the level of the aortic arch. No evidence to suggest aortic dissection. Visualized proximal great vessels show normally patent origins. Mediastinum/Nodes: The esophagus is mildly distended and contains an air-fluid level in its mid segment and fluid in its distal segment. 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 No evidence of pulmonary consolidation, mass, pneumothorax, edema or pleural fluid. Musculoskeletal: No chest wall abnormality. No acute or significant osseous findings. Review of the MIP images confirms the above findings. CT ABDOMEN and PELVIS FINDINGS Hepatobiliary: No focal liver abnormality is seen. No gallstones, gallbladder wall thickening, or biliary dilatation. Pancreas: Continued atrophic appearance of the pancreas which is infiltrated with fat. Spleen: Normal in size without focal abnormality. Adrenals/Urinary Tract: Adrenal glands are unremarkable. Kidneys are normal, without renal calculi, focal lesion, or hydronephrosis. Bladder is unremarkable. Stomach/Bowel: There is thickening involving the distal descending portion of the duodenum as well as the third portion of the duodenum. This is associated with a significant large area of inflammation in the retroperitoneum with complex fluid present over an area measuring approximately 8 cm in transverse diameter and up to 13 cm in height. This was not present on the recent CT on 05/10. Differential considerations include inflammatory process of the duodenum  such as ulcer disease with perforation and surrounding inflammation and also hemorrhage in this region. This is removed from the head of the pancreas and acute pancreatitis is felt much less likely to be a possibility for findings. There likely is some degree of narrowing of the duodenal lumen at this level as the proximal duodenum and stomach are distended and filled with fluid likely reflecting relative outlet obstruction. No evidence of extraluminal air. No small bowel or colonic dilatation is identified distal to the duodenum. No evidence of postoperative complication at the site of appendectomy. No focal abscess identified. Vascular/Lymphatic: No significant vascular findings are present. No enlarged abdominal or pelvic lymph nodes. Reproductive: Brachytherapy seeds are present in the prostate gland. Other: Bilateral inguinal hernias contain fat, right greater than left. Musculoskeletal: No acute or significant osseous findings. Review of the MIP images confirms the above findings. IMPRESSION: 1. No acute findings in the chest. Incidental note is made of aneurysmal disease of the thoracic aorta with maximal caliber of 4.7 cm at the level of the ascending thoracic aorta. Ascending thoracic aortic aneurysm. Recommend semi-annual imaging followup by CTA or MRA and referral to cardiothoracic surgery if not already obtained. This recommendation follows 2010 ACCF/AHA/AATS/ACR/ASA/SCA/SCAI/SIR/STS/SVM Guidelines for the Diagnosis and Management of Patients With Thoracic Aortic Disease. Circulation. 2010; 121: Z610-R604 2. Coronary atherosclerosis with calcified plaque in a 3 vessel distribution. 3. New significant inflammatory process versus hemorrhage surrounding the descending duodenum in the retroperitoneum. This is likely causing relative obstruction of the duodenal lumen with resultant significant distention of the stomach and distal esophagus with fluid. The patient may benefit from nasogastric decompression of  the stomach. It is somewhat unclear what the etiology of the retroperitoneal findings are. This may be on the basis of an inflammatory process such as duodenitis/ruptured duodenal ulcer versus retroperitoneal hemorrhage surrounding the duodenum. Pancreatitis involving the lateral inferior pancreatic head is felt to be much less likely given location of the dominant retroperitoneal abnormality. Electronically Signed  By: Aletta Edouard M.D.   On: 02/02/2018 21:05    Anti-infectives: Anti-infectives (From admission, onward)   Start     Dose/Rate Route Frequency Ordered Stop   02/03/18 0800  levofloxacin (LEVAQUIN) IVPB 750 mg  Status:  Discontinued     750 mg 100 mL/hr over 90 Minutes Intravenous Every 24 hours 02/02/18 1929 02/02/18 2343   02/03/18 0800  vancomycin (VANCOCIN) 1,250 mg in sodium chloride 0.9 % 250 mL IVPB  Status:  Discontinued     1,250 mg 166.7 mL/hr over 90 Minutes Intravenous Every 12 hours 02/02/18 1929 02/02/18 2343   02/03/18 0400  aztreonam (AZACTAM) 2 g in sodium chloride 0.9 % 100 mL IVPB  Status:  Discontinued     2 g 200 mL/hr over 30 Minutes Intravenous Every 8 hours 02/02/18 1929 02/02/18 2343   02/02/18 1930  vancomycin (VANCOCIN) 1,750 mg in sodium chloride 0.9 % 500 mL IVPB     1,750 mg 250 mL/hr over 120 Minutes Intravenous  Once 02/02/18 1909 02/02/18 2137   02/02/18 1915  levofloxacin (LEVAQUIN) IVPB 750 mg     750 mg 100 mL/hr over 90 Minutes Intravenous  Once 02/02/18 1902 02/02/18 2059   02/02/18 1915  aztreonam (AZACTAM) 2 g in sodium chloride 0.9 % 100 mL IVPB     2 g 200 mL/hr over 30 Minutes Intravenous  Once 02/02/18 1902 02/02/18 2136   02/02/18 1915  vancomycin (VANCOCIN) IVPB 1000 mg/200 mL premix  Status:  Discontinued     1,000 mg 200 mL/hr over 60 Minutes Intravenous  Once 02/02/18 1902 02/02/18 1909      Assessment/Plan: s/p Procedure(s): ESOPHAGOGASTRODUODENOSCOPY (EGD) WITH PROPOFOL Full liquid diet at the most for now.  LOS: 2  days   Kathryne Eriksson. Dahlia Bailiff, MD, FACS [phone] [facility] Surgery 02/04/2018

## 2018-02-05 LAB — BASIC METABOLIC PANEL
Anion gap: 7 (ref 5–15)
BUN: 13 mg/dL (ref 6–20)
CHLORIDE: 108 mmol/L (ref 101–111)
CO2: 23 mmol/L (ref 22–32)
CREATININE: 0.79 mg/dL (ref 0.61–1.24)
Calcium: 8 mg/dL — ABNORMAL LOW (ref 8.9–10.3)
Glucose, Bld: 97 mg/dL (ref 65–99)
Potassium: 3 mmol/L — ABNORMAL LOW (ref 3.5–5.1)
SODIUM: 138 mmol/L (ref 135–145)

## 2018-02-05 MED ORDER — ALUM & MAG HYDROXIDE-SIMETH 200-200-20 MG/5ML PO SUSP
30.0000 mL | ORAL | Status: DC | PRN
  Administered 2018-02-08: 30 mL via ORAL
  Filled 2018-02-05 (×2): qty 30

## 2018-02-05 MED ORDER — POTASSIUM CHLORIDE 10 MEQ/100ML IV SOLN
10.0000 meq | INTRAVENOUS | Status: AC
  Administered 2018-02-05 (×2): 10 meq via INTRAVENOUS
  Filled 2018-02-05 (×2): qty 100

## 2018-02-05 MED ORDER — ENSURE ENLIVE PO LIQD
237.0000 mL | Freq: Two times a day (BID) | ORAL | Status: DC
  Administered 2018-02-05 – 2018-02-09 (×7): 237 mL via ORAL

## 2018-02-05 MED ORDER — POTASSIUM CHLORIDE 20 MEQ PO PACK
40.0000 meq | PACK | Freq: Two times a day (BID) | ORAL | Status: DC
  Administered 2018-02-05 (×2): 40 meq via ORAL
  Filled 2018-02-05 (×3): qty 2

## 2018-02-05 MED ORDER — PROMETHAZINE HCL 25 MG/ML IJ SOLN
12.5000 mg | Freq: Four times a day (QID) | INTRAMUSCULAR | Status: AC | PRN
  Administered 2018-02-05 – 2018-02-06 (×2): 12.5 mg via INTRAVENOUS
  Filled 2018-02-05 (×2): qty 1

## 2018-02-05 NOTE — Progress Notes (Signed)
PROGRESS NOTE    Tony [name]  MPN:[id number] DOB: [birth year] DOA: 02/02/2018 PCP: [name], [name], MD    Brief Narrative: Tony Cueva Bullinsis a 77 y.o.malewith medical history significant offundectomy 12 days ago for perforated appendix, hypertension, prostate cancer is post radioactive seed implant but on oral chemotherapy, who presents at the behest of his primary care practitioner for sob, nausea and vomiting. Surgery and GI consulted. Pt underwent EGD showing reflux and erosive esophagitis and diffuse gastritis and duodenitis in the first part of duodenum.     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  Appreciate GI and surgery recommendations.  No nausea or vomiting so far.  Repeat ABD x ray next week to evaluate further .    GOO  - possibly from duodenal edema.  No nausea vomiting, and no surgical intervention at this time.    Malignant neoplasm of the prostate: Further management as per Dr [name].  Continue with zytiga.    Hypertension; well controlled.    Copd: No wheezing heard. Resume home duonebs ,  Mild anemia:  Stable.     Severe hypokalemia:  - replaced. . Magnesium level wnl.    DVT prophylaxis: scd's Code Status: full code.  Family Communication:family at bedside.  Disposition Plan: home when able to tolerate soft diet.    Consultants:   Gastroenterology   Surgery.    Procedures: EGD   Antimicrobials: none.    Subjective: Pt denies chest pain, sob. And nausea, vomiting.     Objective: Vitals:   02/04/18 0611 02/04/18 1526 02/04/18 2133 02/05/18 0553  BP: (!) 152/98 (!) 153/98 (!) 134/95 (!) 144/92  Pulse: 70 72 61 72  Resp: 18  18 18   Temp: 98.8 F (37.1 C) 98.9 F (37.2 C) 98.4 F (36.9 C) 98.5 F (36.9 C)  TempSrc:  Oral Oral Oral  SpO2: 93% 90% 95% 97%  Weight:      Height:        Intake/Output Summary (Last 24 hours) at 02/05/2018 1454 Last data filed at 02/05/2018 0936 Gross per 24 hour  Intake 720 ml  Output 200 ml  Net 520 ml   Filed Weights   02/02/18 1741 02/02/18 1941 02/03/18 1142  Weight: 90.7 kg (200 lb) 90.7 kg (200 lb) 90.7 kg (200 lb)    Examination:  General exam: Appears calm and comfortable not in distress.  Respiratory system: Clear to auscultation. Respiratory effort normal. No wheezing or rhonchi.  Cardiovascular system: S1 & S2 heard, RRR. No JVD, murmurs,No pedal edema. Gastrointestinal system: Abdomenis soft non tender non distended bowel sounds good.  Central nervous system: Alert and oriented. Non focal.  Extremities: Symmetric 5 x 5 power. Skin: No rashes, lesions or ulcers Psychiatry:  Mood & affect appropriate.     Data Reviewed: I have personally reviewed following labs and imaging studies  CBC: Recent Labs  Lab 02/02/18 1752 02/03/18 0416 02/04/18 0917  WBC 12.8* 10.8* 9.6  HGB 14.5 12.2* 12.3*  HCT 42.7 36.1* 37.6*  MCV 92.2 92.6 95.4  PLT 415* 310 276   Basic  Metabolic Panel: Recent Labs  Lab 02/02/18 1752 02/03/18 0416 02/04/18 0917 02/04/18 1854 02/05/18 0317  NA 141 144 141  --  138  K 3.1* 2.7* 2.7* 3.1* 3.0*  CL 102 106 108  --  108  CO2 25 27 24   --  23  GLUCOSE 158* 122* 100*  --  97  BUN 20 19 16   --  13  CREATININE 1.04 0.91 0.73  --  0.79  CALCIUM 9.3 8.3* 8.2*  --  8.0*  MG  --   --  1.8  --   --    GFR: Estimated Creatinine Clearance: 82.6 mL/min (by C-G formula based on SCr of 0.79 mg/dL). Liver Function Tests: Recent Labs  Lab 02/02/18 1930  AST 20  ALT 15*   ALKPHOS 63  BILITOT 1.7*  PROT 6.6  ALBUMIN 3.5   No results for input(s): LIPASE, AMYLASE in the last 168 hours. No results for input(s): AMMONIA in the last 168 hours. Coagulation Profile: No results for input(s): INR, PROTIME in the last 168 hours. Cardiac Enzymes: No results for input(s): CKTOTAL, CKMB, CKMBINDEX, TROPONINI in the last 168 hours. BNP (last 3 results) No results for input(s): PROBNP in the last 8760 hours. HbA1C: No results for input(s): HGBA1C in the last 72 hours. CBG: No results for input(s): GLUCAP in the last 168 hours. Lipid Profile: No results for input(s): CHOL, HDL, LDLCALC, TRIG, CHOLHDL, LDLDIRECT in the last 72 hours. Thyroid Function Tests: No results for input(s): TSH, T4TOTAL, FREET4, T3FREE, THYROIDAB in the last 72 hours. Anemia Panel: No results for input(s): VITAMINB12, FOLATE, FERRITIN, TIBC, IRON, RETICCTPCT in the last 72 hours. Sepsis Labs: Recent Labs  Lab 02/02/18 1828 02/02/18 1947 02/03/18 0417  LATICACIDVEN 2.59* 2.20* 1.3    Recent Results (from the past 240 hour(s))  Blood culture (routine x 2)     Status: None (Preliminary result)   Collection Time: 02/02/18  7:30 PM  Result Value Ref Range Status   Specimen Description BLOOD LEFT ARM  Final   Special Requests   Final    BOTTLES DRAWN AEROBIC AND ANAEROBIC Blood Culture adequate volume   Culture   Final    NO GROWTH 3 DAYS Performed at [facility], [place] [address] [name], [place] [address]    Report Status PENDING  Incomplete  Blood culture (routine x 2)     Status: None (Preliminary result)   Collection Time: 02/02/18  7:33 PM  Result Value Ref Range Status   Specimen Description BLOOD RIGHT HAND  Final   Special Requests   Final    BOTTLES DRAWN AEROBIC AND ANAEROBIC Blood Culture adequate volume   Culture   Final    NO GROWTH 3 DAYS Performed at [facility], [place] [address], [place], [place] [address]    Report Status PENDING  Incomplete          Radiology Studies: No results found.      Scheduled Meds: . abiraterone acetate  1,000 mg Oral QAC breakfast  . feeding supplement (ENSURE ENLIVE)  237 mL Oral BID BM  . guaiFENesin  600 mg Oral BID  . pantoprazole  40 mg Oral BID  . potassium chloride  40 mEq Oral BID   Continuous Infusions:    LOS: 3 days    Time spent: 35 minutes.     Hosie Poisson, MD [facility] Pager [phone]   If 7PM-7AM, please contact night-coverage [email or web address] Password [facility] 02/05/2018, 2:54 PM

## 2018-02-05 NOTE — Progress Notes (Signed)
Progress Note: General Surgery Service   Assessment/Plan: Patient Active Problem List   Diagnosis Date Noted  . Aneurysm of thoracic aorta ([place]) 02/03/2018  . 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 Emphysema lung ([place]) 02/03/2018  . 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 Acute appendicitis 01/21/2018  . Perforated appendicitis 01/21/2018  . Chronic fatigue 06/01/2017  . Essential hypertension, benign 07/09/2016  . 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[place]) 07/06/2016  . Coronary artery calcification seen on CAT scan 05/15/2016  . Anxiety disorder 04/08/2016  . Insomnia 04/08/2016  . 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 Tobacco use disorder 04/08/2016  . Hypercholesteremia 04/08/2016  . Malignant neoplasm of prostate ([place]) 02/23/2014   s/p Procedure(s): ESOPHAGOGASTRODUODENOSCOPY (EGD) WITH PROPOFOL 02/03/2018 Hypokalemia - IV replace and start PO administration via packet  Duodenitis  - continue full liquids, add ensure - plan to continue on full liquids until 5/31, but can transition to outpatient once electrolytes improved and good intake - outpatient UGI to evaluate duodenum    LOS: 3 days  Chief Complaint/Subjective: No pain, tolerating liquids, no nausea or vomiting  Objective: Vital signs in last 24 hours: Temp:  [98.4 F (36.9 C)-98.9 F (37.2 C)] 98.5 F (36.9 C) (05/25 0553) Pulse Rate:  [61-72] 72 (05/25 0553) Resp:  [18] 18 (05/25 0553) BP: (134-153)/(92-98) 144/92 (05/25 0553) SpO2:  [90 %-97 %] 97 % (05/25 0553) Last BM Date: 02/04/18  Intake/Output from previous day: 05/24 0701 - 05/25 0700 In: 1140 [P.O.:1140] Out: 200 [Urine:200] Intake/Output this shift: No intake/output data recorded.  Lungs: CTAB  Cardiovascular: RRR  Abd: soft, NT, ND, incisions c/d/i  Extremities: no edema  Neuro: AOx4  Lab Results: CBC  Recent Labs    02/03/18 0416 02/04/18 0917  WBC 10.8* 9.6  HGB 12.2*  12.3*  HCT 36.1* 37.6*  PLT 310 276   BMET Recent Labs    02/04/18 0917 02/04/18 1854 02/05/18 0317  NA 141  --  138  K 2.7* 3.1* 3.0*  CL 108  --  108  CO2 24  --  23  GLUCOSE 100*  --  97  BUN 16  --  13  CREATININE 0.73  --  0.79  CALCIUM 8.2*  --  8.0*   PT/INR No results for input(s): LABPROT, INR in the last 72 hours. ABG No results for input(s): PHART, HCO3 in the last 72 hours.  Invalid input(s): PCO2, PO2  Studies/Results:  Anti-infectives: Anti-infectives (From admission, onward)   Start     Dose/Rate Route Frequency Ordered Stop   02/03/18 0800  levofloxacin (LEVAQUIN) IVPB 750 mg  Status:  Discontinued     750 mg 100 mL/hr over 90 Minutes Intravenous Every 24 hours 02/02/18 1929 02/02/18 2343   02/03/18 0800  vancomycin (VANCOCIN) 1,250 mg in sodium chloride 0.9 % 250 mL IVPB  Status:  Discontinued     1,250 mg 166.7 mL/hr over 90 Minutes Intravenous Every 12 hours 02/02/18 1929 02/02/18 2343   02/03/18 0400  aztreonam (AZACTAM) 2 g in sodium chloride 0.9 % 100 mL IVPB  Status:  Discontinued     2 g 200 mL/hr over 30 Minutes Intravenous Every 8 hours 02/02/18 1929 02/02/18 2343   02/02/18 1930  vancomycin (VANCOCIN) 1,750 mg in sodium chloride 0.9 % 500 mL IVPB     1,750 mg 250 mL/hr over 120 Minutes Intravenous  Once 02/02/18 1909 02/02/18 2137   02/02/18 1915  levofloxacin (LEVAQUIN) IVPB 750 mg  750 mg 100 mL/hr over 90 Minutes Intravenous  Once 02/02/18 1902 02/02/18 2059   02/02/18 1915  aztreonam (AZACTAM) 2 g in sodium chloride 0.9 % 100 mL IVPB     2 g 200 mL/hr over 30 Minutes Intravenous  Once 02/02/18 1902 02/02/18 2136   02/02/18 1915  vancomycin (VANCOCIN) IVPB 1000 mg/200 mL premix  Status:  Discontinued     1,000 mg 200 mL/hr over 60 Minutes Intravenous  Once 02/02/18 1902 02/02/18 1909      Medications: Scheduled Meds: . abiraterone acetate  1,000 mg Oral QAC breakfast  . guaiFENesin  600 mg Oral BID  . pantoprazole  40 mg Oral  BID  . potassium chloride  40 mEq Oral BID   Continuous Infusions: . potassium chloride     PRN Meds:.albuterol, fluticasone, LORazepam, ondansetron **OR** ondansetron (ZOFRAN) IV  Mickeal Skinner, MD Pg# [phone] [facility]

## 2018-02-05 NOTE — Evaluation (Addendum)
Physical Therapy Evaluation & Discharge Patient Details Name: Tony [name] MRN: [id number] DOB: 02/12/1941 Today's Date: 02/05/2018   History of Present Illness  Pt is a 77 y.o. male with PMH significant for fundectomy 12 days ago for perforated appendix, HTN, prostate CA on oral chemotherapy, admitted from PCP's office 02/02/18 with SOB concern for PE or lung disease. S/p EGD with propofol 5/23 showing reflux, erosive esophagitis, diffuse gastritis, and duodenitis; pt with complete gastric outlet obstruction.     Clinical Impression  Patient evaluated by Physical Therapy with no further acute PT needs identified. PTA, pt mod indep with SPC; family available for 24/7 support. Today, pt supervision-level for all mobility using RW. All education has been completed and the patient has no further questions. Encouraged pt to continued ambulating with supervision from family/nursing staff during hospital stay (RN aware). Pt pleasantly declining HHPT services. PT is signing off. Thank you for this referral.    Follow Up Recommendations No PT follow up;Supervision for mobility/OOB    Equipment Recommendations  Rolling walker with 5" wheels;3in1 (PT)    Recommendations for Other Services       Precautions / Restrictions Precautions Precautions: Fall Restrictions Weight Bearing Restrictions: No      Mobility  Bed Mobility Overal bed mobility: Independent                Transfers Overall transfer level: Needs assistance Equipment used: None Transfers: Sit to/from Stand Sit to Stand: Supervision            Ambulation/Gait Ambulation/Gait assistance: Supervision Ambulation Distance (Feet): 500 Feet Assistive device: Rolling walker (2 wheeled) Gait Pattern/deviations: Step-through pattern;Decreased stride length;Trunk flexed Gait velocity: Decreased Gait velocity interpretation: <1.8 ft/sec, indicate of risk for recurrent falls General Gait Details: Slow, controlled  amb with RW and supervision for safety; intermittent cues for upright posture as pt tends to flex forward  Stairs            Wheelchair Mobility    Modified Rankin (Stroke Patients Only)       Balance Overall balance assessment: Needs assistance   Sitting balance-Leahy Scale: Good       Standing balance-Leahy Scale: Fair Standing balance comment: Static stand and amb in room with no UE support; dynamic stabilty improved with UE support                             Pertinent Vitals/Pain Pain Assessment: No/denies pain    Home Living Family/patient expects to be discharged to:: Private residence Living Arrangements: Spouse/significant other;Children Available Help at Discharge: Family;Available 24 hours/day Type of Home: House Home Access: Stairs to enter   [employer] of Steps: 1 Home Layout: One level Home Equipment: Cane - single point      Prior Function Level of Independence: Independent with assistive device(s)         Comments: Mod indep with SPC     Hand Dominance        Extremity/Trunk Assessment   Upper Extremity Assessment Upper Extremity Assessment: Overall WFL for tasks assessed    Lower Extremity Assessment Lower Extremity Assessment: Overall WFL for tasks assessed       Communication   Communication: No difficulties  Cognition Arousal/Alertness: Awake/alert Behavior During Therapy: WFL for tasks assessed/performed Overall Cognitive Status: Within Functional Limits for tasks assessed  General Comments General comments (skin integrity, edema, etc.): Wife, daughter, and granddaughter present during session    Exercises     Assessment/Plan    PT Assessment Patent does not need any further PT services  PT Problem List         PT Treatment Interventions      PT Goals (Current goals can be found in the Care Plan section)  Acute Rehab PT Goals PT  Goal Formulation: All assessment and education complete, DC therapy    Frequency     Barriers to discharge        Co-evaluation               AM-PAC PT "6 Clicks" Daily Activity  Outcome Measure Difficulty turning over in bed (including adjusting bedclothes, sheets and blankets)?: None Difficulty moving from lying on back to sitting on the side of the bed? : None Difficulty sitting down on and standing up from a chair with arms (e.g., wheelchair, bedside commode, etc,.)?: None Help needed moving to and from a bed to chair (including a wheelchair)?: A Little Help needed walking in hospital room?: A Little Help needed climbing 3-5 steps with a railing? : A Little 6 Click Score: 21    End of Session Equipment Utilized During Treatment: Gait belt Activity Tolerance: Patient tolerated treatment well Patient left: in chair;with call bell/phone within reach Nurse Communication: Mobility status PT Visit Diagnosis: Other abnormalities of gait and mobility (R26.89)    Time: 1012-1030 PT Time Calculation (min) (ACUTE ONLY): 18 min   Charges:   PT Evaluation $PT Eval Moderate Complexity: 1 Mod     PT G Codes:       Mabeline Caras, PT, DPT Acute Rehab Services  Pager: [place] 02/05/2018, 1:36 PM

## 2018-02-05 NOTE — Progress Notes (Signed)
[facility] Gastroenterology Progress Note  Tony [name] 77 y.o. 10-Oct-1940   Subjective: Feels ok. Tolerating liquid diet. Denies abdominal pain.  Objective: Vital signs: Vitals:   02/04/18 2133 02/05/18 0553  BP: (!) 134/95 (!) 144/92  Pulse: 61 72  Resp: 18 18  Temp: 98.4 F (36.9 C) 98.5 F (36.9 C)  SpO2: 95% 97%    Physical Exam: Gen: alert, no acute distress HEENT: anicteric sclera CV: RRR Chest: CTA B Abd: RUQ tenderness with guarding, soft, nondistended, +BS   Lab Results: Recent Labs    02/04/18 0917 02/04/18 1854 02/05/18 0317  NA 141  --  138  K 2.7* 3.1* 3.0*  CL 108  --  108  CO2 24  --  23  GLUCOSE 100*  --  97  BUN 16  --  13  CREATININE 0.73  --  0.79  CALCIUM 8.2*  --  8.0*  MG 1.8  --   --    Recent Labs    02/02/18 1930  AST 20  ALT 15*  ALKPHOS 63  BILITOT 1.7*  PROT 6.6  ALBUMIN 3.5   Recent Labs    02/03/18 0416 02/04/18 0917  WBC 10.8* 9.6  HGB 12.2* 12.3*  HCT 36.1* 37.6*  MCV 92.6 95.4  PLT 310 276      Assessment/Plan: Duodenitis - tolerating full liquids and surgery plans to keep on full liquids until next week. Will sign off. Call if questions.   [place] C. 02/05/2018, 2:40 PM  Questions please call [phone] ID: Tony [name], male   DOB: 08-Dec-1940, 77 y.o.   MRN: [id number]

## 2018-02-06 LAB — BASIC METABOLIC PANEL
ANION GAP: 10 (ref 5–15)
BUN: 11 mg/dL (ref 6–20)
CALCIUM: 8.3 mg/dL — AB (ref 8.9–10.3)
CO2: 21 mmol/L — AB (ref 22–32)
Chloride: 103 mmol/L (ref 101–111)
Creatinine, Ser: 0.74 mg/dL (ref 0.61–1.24)
Glucose, Bld: 107 mg/dL — ABNORMAL HIGH (ref 65–99)
Potassium: 2.8 mmol/L — ABNORMAL LOW (ref 3.5–5.1)
SODIUM: 134 mmol/L — AB (ref 135–145)

## 2018-02-06 LAB — MAGNESIUM: MAGNESIUM: 1.8 mg/dL (ref 1.7–2.4)

## 2018-02-06 LAB — POTASSIUM: POTASSIUM: 3.2 mmol/L — AB (ref 3.5–5.1)

## 2018-02-06 MED ORDER — POTASSIUM CHLORIDE CRYS ER 20 MEQ PO TBCR
40.0000 meq | EXTENDED_RELEASE_TABLET | Freq: Two times a day (BID) | ORAL | Status: DC
  Administered 2018-02-06 – 2018-02-08 (×5): 40 meq via ORAL
  Filled 2018-02-06 (×5): qty 2

## 2018-02-06 MED ORDER — POTASSIUM CHLORIDE 10 MEQ/100ML IV SOLN
10.0000 meq | INTRAVENOUS | Status: AC
  Administered 2018-02-06 (×4): 10 meq via INTRAVENOUS
  Filled 2018-02-06 (×4): qty 100

## 2018-02-06 MED ORDER — METOCLOPRAMIDE HCL 5 MG/ML IJ SOLN
5.0000 mg | Freq: Four times a day (QID) | INTRAMUSCULAR | Status: DC
  Administered 2018-02-06 – 2018-02-09 (×8): 5 mg via INTRAVENOUS
  Filled 2018-02-06 (×9): qty 2

## 2018-02-06 MED ORDER — SODIUM CHLORIDE 0.9 % IV SOLN
INTRAVENOUS | Status: DC
  Administered 2018-02-06 – 2018-02-08 (×4): via INTRAVENOUS

## 2018-02-06 NOTE — Progress Notes (Signed)
Patient vomited x2 after receiving Zofran from day nurse@ 1800. [facility] paged via ami on and informed of this. New orders initiated for phenergan

## 2018-02-06 NOTE — Progress Notes (Signed)
3 Days Post-Op   Subjective/Chief Complaint: Patient had vomiting with full liquids yesterday Was tolerating clears without difficulty BM yesterday   Objective: Vital signs in last 24 hours: Temp:  [98.1 F (36.7 C)-99.5 F (37.5 C)] 98.9 F (37.2 C) (05/26 0435) Pulse Rate:  [76-86] 76 (05/26 0435) Resp:  [16-20] 18 (05/26 0435) BP: (126-131)/(95-97) 131/97 (05/26 0435) SpO2:  [92 %-94 %] 92 % (05/26 0435) Last BM Date: 02/05/18  Intake/Output from previous day: 05/25 0701 - 05/26 0700 In: 1145 [P.O.:1145] Out: -  Intake/Output this shift: No intake/output data recorded.  General appearance: alert, cooperative and no distress GI: soft, non-tender; bowel sounds normal; no masses,  no organomegaly  Lab Results:  Recent Labs    02/04/18 0917  WBC 9.6  HGB 12.3*  HCT 37.6*  PLT 276   BMET Recent Labs    02/05/18 0317 02/06/18 0327  NA 138 134*  K 3.0* 2.8*  CL 108 103  CO2 23 21*  GLUCOSE 97 107*  BUN 13 11  CREATININE 0.79 0.74  CALCIUM 8.0* 8.3*   PT/INR No results for input(s): LABPROT, INR in the last 72 hours. ABG No results for input(s): PHART, HCO3 in the last 72 hours.  Invalid input(s): PCO2, PO2  Studies/Results: No results found.  Anti-infectives: Anti-infectives (From admission, onward)   Start     Dose/Rate Route Frequency Ordered Stop   02/03/18 0800  levofloxacin (LEVAQUIN) IVPB 750 mg  Status:  Discontinued     750 mg 100 mL/hr over 90 Minutes Intravenous Every 24 hours 02/02/18 1929 02/02/18 2343   02/03/18 0800  vancomycin (VANCOCIN) 1,250 mg in sodium chloride 0.9 % 250 mL IVPB  Status:  Discontinued     1,250 mg 166.7 mL/hr over 90 Minutes Intravenous Every 12 hours 02/02/18 1929 02/02/18 2343   02/03/18 0400  aztreonam (AZACTAM) 2 g in sodium chloride 0.9 % 100 mL IVPB  Status:  Discontinued     2 g 200 mL/hr over 30 Minutes Intravenous Every 8 hours 02/02/18 1929 02/02/18 2343   02/02/18 1930  vancomycin (VANCOCIN) 1,750 mg  in sodium chloride 0.9 % 500 mL IVPB     1,750 mg 250 mL/hr over 120 Minutes Intravenous  Once 02/02/18 1909 02/02/18 2137   02/02/18 1915  levofloxacin (LEVAQUIN) IVPB 750 mg     750 mg 100 mL/hr over 90 Minutes Intravenous  Once 02/02/18 1902 02/02/18 2059   02/02/18 1915  aztreonam (AZACTAM) 2 g in sodium chloride 0.9 % 100 mL IVPB     2 g 200 mL/hr over 30 Minutes Intravenous  Once 02/02/18 1902 02/02/18 2136   02/02/18 1915  vancomycin (VANCOCIN) IVPB 1000 mg/200 mL premix  Status:  Discontinued     1,000 mg 200 mL/hr over 60 Minutes Intravenous  Once 02/02/18 1902 02/02/18 1909      Assessment/Plan: s/p Procedure(s): ESOPHAGOGASTRODUODENOSCOPY (EGD) WITH PROPOFOL 02/03/2018 Hypokalemia - IV replace and start PO administration via packet  Duodenitis  - return to clear liquids, add ensure - plan to continue on liquids until 5/31, but can transition to outpatient once electrolytes improved and good intake - outpatient UGI to evaluate duodenum    LOS: 4 days    Tony [name] 02/06/2018

## 2018-02-06 NOTE — Progress Notes (Signed)
Patient has vomited 500cc of yellow green emesis.  Patient states he feels better and is not requesting anything for nausea at this time.  Will continue to monitor.

## 2018-02-06 NOTE — Progress Notes (Signed)
PROGRESS NOTE    Tony [name]  KJZ:[id number] DOB: 05/11/1941 DOA: 02/02/2018 PCP: [name], [name], MD    Brief Narrative: Tony [name] a 77 y.o.malewith medical history significant offundectomy 12 days ago for perforated appendix, hypertension, prostate cancer is post radioactive seed implant but on oral chemotherapy, who presents at the behest of his primary care practitioner for sob, nausea and vomiting. Surgery and GI consulted. Pt underwent EGD showing reflux and erosive esophagitis and diffuse gastritis and duodenitis in the first part of duodenum.     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  Appreciate GI and surgery recommendations.  No nausea or vomiting so far.  Pt back on clears as he started vomiting on full liquid diet.  Continue with clear liquid diet and if still vomiting,possibly change to npo.    GOO  - possibly from duodenal edema.  - recurrent nausea and vomiting. Back on clears.   - surgery on board and no surgical intervention at this time.    Malignant neoplasm of the prostate: Further management as per Dr [name].  Continue with zytiga.    Hypertension; well controlled.    Copd: No wheezing heard. Resume home duonebs ,  Mild anemia:  Stable.     Severe hypokalemia:  - replaced. . Magnesium level wnl.  Repeat level tonight.     DVT prophylaxis: scd's Code Status: full code.  Family Communication:family at bedside.  Disposition Plan: home when able to tolerate soft diet.    Consultants:     Gastroenterology   Surgery.    Procedures: EGD   Antimicrobials: none.    Subjective: Persistent nausea and vomiting today.   Objective: Vitals:   02/05/18 1455 02/05/18 2124 02/06/18 0435 02/06/18 1415  BP: (!) 130/95 (!) 126/96 (!) 131/97 116/86  Pulse: 86 76 76 88  Resp: 16 20 18 16   Temp: 98.1 F (36.7 C) 99.5 F (37.5 C) 98.9 F (37.2 C) 97.6 F (36.4 C)  TempSrc: Oral Oral Oral   SpO2: 94% 92% 92% 96%  Weight:      Height:        Intake/Output Summary (Last 24 hours) at 02/06/2018 1922 Last data filed at 02/06/2018 1546 Gross per 24 hour  Intake 200 ml  Output 500 ml  Net -300 ml   Filed Weights   02/02/18 1741 02/02/18 1941 02/03/18 1142  Weight: 90.7 kg (200 lb) 90.7 kg (200 lb) 90.7 kg (200 lb)    Examination:  General exam: uncomfortable, laying in the bed.  Respiratory system: Clear to auscultation no wheezing .  Cardiovascular system: S1 & S2 heard, RRR. No JVD, murmurs,No pedal edema. Gastrointestinal system: Abdomen is fot mildly tender in the epigastric area. No distention. Bowel sounds good.  Central nervous system: Alert and oriented. Non focal.  Extremities: Symmetric 5 x 5 power. Skin: No rashes, lesions or ulcers Psychiatry:  Mood & affect appropriate.     Data Reviewed: I have personally reviewed following labs and imaging studies  CBC: Recent Labs  Lab 02/02/18 1752 02/03/18 0416 02/04/18 0917  WBC  12.8* 10.8* 9.6  HGB 14.5 12.2* 12.3*  HCT 42.7 36.1* 37.6*  MCV 92.2 92.6 95.4  PLT 415* 310 053   Basic Metabolic Panel: Recent Labs  Lab 02/02/18 1752 02/03/18 0416 02/04/18 0917 02/04/18 1854 02/05/18 0317 02/06/18 0327  NA 141 144 141  --  138 134*  K 3.1* 2.7* 2.7* 3.1* 3.0* 2.8*  CL 102 106 108  --  108 103  CO2 25 27 24   --  23 21*  GLUCOSE 158* 122* 100*  --  97 107*  BUN 20 19 16   --  13 11  CREATININE 1.04 0.91 0.73  --  0.79 0.74  CALCIUM 9.3 8.3* 8.2*  --  8.0* 8.3*  MG  --   --  1.8  --   --  1.8    GFR: Estimated Creatinine Clearance: 82.6 mL/min (by C-G formula based on SCr of 0.74 mg/dL). Liver Function Tests: Recent Labs  Lab 02/02/18 1930  AST 20  ALT 15*  ALKPHOS 63  BILITOT 1.7*  PROT 6.6  ALBUMIN 3.5   No results for input(s): LIPASE, AMYLASE in the last 168 hours. No results for input(s): AMMONIA in the last 168 hours. Coagulation Profile: No results for input(s): INR, PROTIME in the last 168 hours. Cardiac Enzymes: No results for input(s): CKTOTAL, CKMB, CKMBINDEX, TROPONINI in the last 168 hours. BNP (last 3 results) No results for input(s): PROBNP in the last 8760 hours. HbA1C: No results for input(s): HGBA1C in the last 72 hours. CBG: No results for input(s): GLUCAP in the last 168 hours. Lipid Profile: No results for input(s): CHOL, HDL, LDLCALC, TRIG, CHOLHDL, LDLDIRECT in the last 72 hours. Thyroid Function Tests: No results for input(s): TSH, T4TOTAL, FREET4, T3FREE, THYROIDAB in the last 72 hours. Anemia Panel: No results for input(s): VITAMINB12, FOLATE, FERRITIN, TIBC, IRON, RETICCTPCT in the last 72 hours. Sepsis Labs: Recent Labs  Lab 02/02/18 1828 02/02/18 1947 02/03/18 0417  LATICACIDVEN 2.59* 2.20* 1.3    Recent Results (from the past 240 hour(s))  Blood culture (routine x 2)     Status: None (Preliminary result)   Collection Time: 02/02/18  7:30 PM  Result Value Ref Range Status   Specimen Description BLOOD LEFT ARM  Final   Special Requests   Final    BOTTLES DRAWN AEROBIC AND ANAEROBIC Blood Culture adequate volume   Culture   Final    NO GROWTH 4 DAYS Performed at [facility], [place] [address], [place], [place] [address]    Report Status PENDING  Incomplete  Blood culture (routine x 2)     Status: None (Preliminary result)   Collection Time: 02/02/18  7:33 PM  Result Value Ref Range Status   Specimen Description BLOOD RIGHT HAND  Final   Special Requests   Final    BOTTLES DRAWN AEROBIC AND ANAEROBIC Blood Culture  adequate volume   Culture   Final    NO GROWTH 4 DAYS Performed at [facility], [place] [address] [place],  [address]    Report Status PENDING  Incomplete         Radiology Studies: No results found.      Scheduled Meds: . abiraterone acetate  1,000 mg Oral QAC breakfast  . feeding supplement (ENSURE ENLIVE)  237 mL Oral BID BM  . guaiFENesin  600 mg Oral BID  . metoCLOPramide (REGLAN) injection  5 mg Intravenous Q6H  . pantoprazole  40 mg Oral BID  . potassium chloride  40 mEq Oral BID   Continuous Infusions:    LOS: 4 days    Time spent: 35 minutes.     Hosie Poisson, MD [facility] Pager [phone]   If 7PM-7AM, please contact night-coverage [email or web address] Password [facility] 02/06/2018, 7:22 PM

## 2018-02-07 LAB — CULTURE, BLOOD (ROUTINE X 2)
Culture: NO GROWTH
Culture: NO GROWTH
SPECIAL REQUESTS: ADEQUATE
Special Requests: ADEQUATE

## 2018-02-07 LAB — CBC
HCT: 36.8 % — ABNORMAL LOW (ref 39.0–52.0)
HEMOGLOBIN: 12.3 g/dL — AB (ref 13.0–17.0)
MCH: 31.2 pg (ref 26.0–34.0)
MCHC: 33.4 g/dL (ref 30.0–36.0)
MCV: 93.4 fL (ref 78.0–100.0)
Platelets: 241 10*3/uL (ref 150–400)
RBC: 3.94 MIL/uL — ABNORMAL LOW (ref 4.22–5.81)
RDW: 12.6 % (ref 11.5–15.5)
WBC: 9.5 10*3/uL (ref 4.0–10.5)

## 2018-02-07 LAB — BASIC METABOLIC PANEL
Anion gap: 10 (ref 5–15)
BUN: 14 mg/dL (ref 6–20)
CHLORIDE: 105 mmol/L (ref 101–111)
CO2: 23 mmol/L (ref 22–32)
Calcium: 8.3 mg/dL — ABNORMAL LOW (ref 8.9–10.3)
Creatinine, Ser: 0.86 mg/dL (ref 0.61–1.24)
GFR calc non Af Amer: >60 mL/min (ref >60)
Glucose, Bld: 106 mg/dL — ABNORMAL HIGH (ref 65–99)
Potassium: 3.4 mmol/L — ABNORMAL LOW (ref 3.5–5.1)
SODIUM: 138 mmol/L (ref 135–145)

## 2018-02-07 NOTE — Progress Notes (Signed)
Patient had a huge bowel movement this morning.  Patient states, " he feels better this morning".  Will continue to monitor.

## 2018-02-07 NOTE — Progress Notes (Signed)
PROGRESS NOTE    Tony [name]  JOA:[id number] DOB: 19-Nov-1940 DOA: 02/02/2018 PCP: [name], [name], MD    Brief Narrative: Tony [name] a 77 y.o.malewith medical history significant offundectomy 12 days ago for perforated appendix, hypertension, prostate cancer is post radioactive seed implant but on oral chemotherapy, who presents at the behest of his primary care practitioner for sob, nausea and vomiting. Surgery and GI consulted. Pt underwent EGD showing reflux and erosive esophagitis and diffuse gastritis and duodenitis in the first part of duodenum.     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  Appreciate GI and surgery recommendations.  No nausea or vomiting so far.  Pt back on clears as he started vomiting on full liquid diet.  Continue with clear liquid diet and resume the same for another 48 hours.  Replete potassium as needed.     GOO  - possibly from duodenal edema.  - recurrent nausea and vomiting. Back on clears.  No nausea or vomiting today.  No abdominal pain.    - surgery on board and no surgical intervention at this time.    Malignant neoplasm of the prostate: Further management as per Dr [name].  Continue with zytiga.    Hypertension; well controlled.    Copd: No wheezing heard. Resume home duonebs ,  Mild anemia:  Stable.     Severe hypokalemia:  - replaced. . Magnesium level wnl.  Repeat level tonight.     DVT prophylaxis: scd's Code Status: full code.  Family Communication:family at  bedside.  Disposition Plan: home when able to tolerate soft diet.    Consultants:   Gastroenterology   Surgery.    Procedures: EGD   Antimicrobials: none.    Subjective: No nausea or vomiting.  No abd pain.   Objective: Vitals:   02/07/18 0531 02/07/18 0845 02/07/18 1454 02/07/18 1527  BP: (!) 146/90 (!) 142/95 (!) 131/93 123/89  Pulse: 85 92 84   Resp: 18 20 20    Temp: 99.6 F (37.6 C) 99.4 F (37.4 C) 97.8 F (36.6 C)   TempSrc: Oral Axillary Oral   SpO2: 91% 94% 95%   Weight:      Height:        Intake/Output Summary (Last 24 hours) at 02/07/2018 1636 Last data filed at 02/07/2018 1500 Gross per 24 hour  Intake 2693.34 ml  Output 500 ml  Net 2193.34 ml   Filed Weights   02/02/18 1741 02/02/18 1941 02/03/18 1142  Weight: 90.7 kg (200 lb) 90.7 kg (200 lb) 90.7 kg (200 lb)    Examination:  General exam: alert and comfortable.  Respiratory system: Clear to auscultation no wheezing or rhonchi .  Cardiovascular system: S1 & S2 heard, RRR. No JVD, murmurs,No pedal edema. Gastrointestinal system: Abdomen  Is soft non tender non distended bowel sounds heard.  Central nervous system: Alert and oriented. Non focal.  Extremities: Symmetric 5 x 5 power. Skin: No rashes, lesions or ulcers Psychiatry:  Mood & affect appropriate.     Data Reviewed: I have personally reviewed following labs and imaging studies  CBC: Recent Labs  Lab 02/02/18 1752 02/03/18 0416 02/04/18 0917 02/07/18 0219  WBC 12.8* 10.8* 9.6 9.5  HGB 14.5 12.2* 12.3* 12.3*  HCT 42.7 36.1* 37.6* 36.8*  MCV 92.2 92.6 95.4 93.4  PLT 415* 310 276 751   Basic Metabolic Panel: Recent Labs  Lab 02/03/18 0416 02/04/18 0917 02/04/18 1854 02/05/18 0317 02/06/18 0327 02/06/18 2002 02/07/18 0219  NA 144 141  --  138 134*  --  138  K 2.7* 2.7* 3.1* 3.0* 2.8* 3.2* 3.4*  CL 106 108  --  108 103  --  105  CO2 27 24  --  23 21*  --  23  GLUCOSE 122* 100*  --  97 107*  --  106*  BUN 19 16  --   13 11  --  14  CREATININE 0.91 0.73  --  0.79 0.74  --  0.86  CALCIUM 8.3* 8.2*  --  8.0* 8.3*  --  8.3*  MG  --  1.8  --   --  1.8  --   --    GFR: Estimated Creatinine Clearance: 76.8 mL/min (by C-G formula based on SCr of 0.86 mg/dL). Liver Function Tests: Recent Labs  Lab 02/02/18 1930  AST 20  ALT 15*  ALKPHOS 63  BILITOT 1.7*  PROT 6.6  ALBUMIN 3.5   No results for input(s): LIPASE, AMYLASE in the last 168 hours. No results for input(s): AMMONIA in the last 168 hours. Coagulation Profile: No results for input(s): INR, PROTIME in the last 168 hours. Cardiac Enzymes: No results for input(s): CKTOTAL, CKMB, CKMBINDEX, TROPONINI in the last 168 hours. BNP (last 3 results) No results for input(s): PROBNP in the last 8760 hours. HbA1C: No results for input(s): HGBA1C in the last 72 hours. CBG: No results for input(s): GLUCAP in the last 168 hours. Lipid Profile: No results for input(s): CHOL, HDL, LDLCALC, TRIG, CHOLHDL, LDLDIRECT in the last 72 hours. Thyroid Function Tests: No results for input(s): TSH, T4TOTAL, FREET4, T3FREE, THYROIDAB in the last 72 hours. Anemia Panel: No results for input(s): VITAMINB12, FOLATE, FERRITIN, TIBC, IRON, RETICCTPCT in the last 72 hours. Sepsis Labs: Recent Labs  Lab 02/02/18 1828 02/02/18 1947 02/03/18 0417  LATICACIDVEN 2.59* 2.20* 1.3    Recent Results (from the past 240 hour(s))  Blood culture (routine x 2)     Status: None   Collection Time: 02/02/18  7:30 PM  Result Value Ref Range Status   Specimen Description BLOOD LEFT ARM  Final   Special Requests   Final    BOTTLES DRAWN AEROBIC AND ANAEROBIC Blood Culture adequate volume   Culture   Final    NO GROWTH 5 DAYS Performed at [facility], [place] [address] [place], [place] [address]    Report Status 02/07/2018 FINAL  Final  Blood culture (routine x 2)     Status: None   Collection Time: 02/02/18  7:33 PM  Result Value Ref Range Status   Specimen Description  BLOOD RIGHT HAND  Final   Special Requests   Final    BOTTLES DRAWN AEROBIC AND ANAEROBIC Blood Culture adequate volume   Culture   Final    NO GROWTH 5 DAYS Performed at [facility], [place] [address], [place], [place] [address]    Report Status 02/07/2018 FINAL  Final         Radiology Studies: No results found.      Scheduled Meds: . abiraterone acetate  1,000 mg  Oral QAC breakfast  . feeding supplement (ENSURE ENLIVE)  237 mL Oral BID BM  . guaiFENesin  600 mg Oral BID  . metoCLOPramide (REGLAN) injection  5 mg Intravenous Q6H  . pantoprazole  40 mg Oral BID  . potassium chloride  40 mEq Oral BID   Continuous Infusions: . sodium chloride 100 mL/hr at 02/07/18 1625     LOS: 5 days    Time spent: 35 minutes.     Hosie Poisson, MD [facility] Pager [phone]   If 7PM-7AM, please contact night-coverage [email or web address] Password [facility] 02/07/2018, 4:36 PM

## 2018-02-07 NOTE — Progress Notes (Signed)
Progress Note: General Surgery Service   Assessment/Plan: Patient Active Problem List   Diagnosis Date Noted  . Aneurysm of thoracic aorta ([place]) 02/03/2018  . 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 Emphysema lung ([place]) 02/03/2018  . 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 Acute appendicitis 01/21/2018  . Perforated appendicitis 01/21/2018  . Chronic fatigue 06/01/2017  . Essential hypertension, benign 07/09/2016  . 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[place]) 07/06/2016  . Coronary artery calcification seen on CAT scan 05/15/2016  . Anxiety disorder 04/08/2016  . Insomnia 04/08/2016  . 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 Tobacco use disorder 04/08/2016  . Hypercholesteremia 04/08/2016  . Malignant neoplasm of prostate ([place]) 02/23/2014   s/p Procedure(s): ESOPHAGOGASTRODUODENOSCOPY (EGD) WITH PROPOFOL 02/03/2018  Continue clear liquids and ensure Continue potassium packet   LOS: 5 days  Chief Complaint/Subjective: Vomited yesterday, large bowel movement overnight  Objective: Vital signs in last 24 hours: Temp:  [97.6 F (36.4 C)-100.3 F (37.9 C)] 99.6 F (37.6 C) (05/27 0531) Pulse Rate:  [85-89] 85 (05/27 0531) Resp:  [16-18] 18 (05/27 0531) BP: (116-146)/(86-99) 146/90 (05/27 0531) SpO2:  [91 %-96 %] 91 % (05/27 0531) Last BM Date: 02/05/18  Intake/Output from previous day: 05/26 0701 - 05/27 0700 In: 1141.7 [I.V.:941.7; IV Piggyback:200] Out: 1000 [Emesis/NG output:1000] Intake/Output this shift: No intake/output data recorded.  Lungs: CTAB  Cardiovascular: RRR  Abd: soft, NT, ND  Extremities: no edema  Neuro: AOx4  Lab Results: CBC  Recent Labs    02/04/18 0917 02/07/18 0219  WBC 9.6 9.5  HGB 12.3* 12.3*  HCT 37.6* 36.8*  PLT 276 241   BMET Recent Labs    02/06/18 0327 02/06/18 2002 02/07/18 0219  NA 134*  --  138  K 2.8* 3.2* 3.4*  CL 103  --  105  CO2 21*  --  23  GLUCOSE 107*  --   106*  BUN 11  --  14  CREATININE 0.74  --  0.86  CALCIUM 8.3*  --  8.3*   PT/INR No results for input(s): LABPROT, INR in the last 72 hours. ABG No results for input(s): PHART, HCO3 in the last 72 hours.  Invalid input(s): PCO2, PO2  Studies/Results:  Anti-infectives: Anti-infectives (From admission, onward)   Start     Dose/Rate Route Frequency Ordered Stop   02/03/18 0800  levofloxacin (LEVAQUIN) IVPB 750 mg  Status:  Discontinued     750 mg 100 mL/hr over 90 Minutes Intravenous Every 24 hours 02/02/18 1929 02/02/18 2343   02/03/18 0800  vancomycin (VANCOCIN) 1,250 mg in sodium chloride 0.9 % 250 mL IVPB  Status:  Discontinued     1,250 mg 166.7 mL/hr over 90 Minutes Intravenous Every 12 hours 02/02/18 1929 02/02/18 2343   02/03/18 0400  aztreonam (AZACTAM) 2 g in sodium chloride 0.9 % 100 mL IVPB  Status:  Discontinued     2 g 200 mL/hr over 30 Minutes Intravenous Every 8 hours 02/02/18 1929 02/02/18 2343   02/02/18 1930  vancomycin (VANCOCIN) 1,750 mg in sodium chloride 0.9 % 500 mL IVPB     1,750 mg 250 mL/hr over 120 Minutes Intravenous  Once 02/02/18 1909 02/02/18 2137   02/02/18 1915  levofloxacin (LEVAQUIN) IVPB 750 mg     750 mg 100 mL/hr over 90 Minutes Intravenous  Once 02/02/18 1902 02/02/18 2059   02/02/18 1915  aztreonam (AZACTAM) 2 g in sodium chloride 0.9 % 100 mL IVPB     2 g  200 mL/hr over 30 Minutes Intravenous  Once 02/02/18 1902 02/02/18 2136   02/02/18 1915  vancomycin (VANCOCIN) IVPB 1000 mg/200 mL premix  Status:  Discontinued     1,000 mg 200 mL/hr over 60 Minutes Intravenous  Once 02/02/18 1902 02/02/18 1909      Medications: Scheduled Meds: . abiraterone acetate  1,000 mg Oral QAC breakfast  . feeding supplement (ENSURE ENLIVE)  237 mL Oral BID BM  . guaiFENesin  600 mg Oral BID  . metoCLOPramide (REGLAN) injection  5 mg Intravenous Q6H  . pantoprazole  40 mg Oral BID  . potassium chloride  40 mEq Oral BID   Continuous Infusions: .  sodium chloride 100 mL/hr at 02/06/18 2104   PRN Meds:.albuterol, alum & mag hydroxide-simeth, fluticasone, LORazepam, ondansetron **OR** ondansetron (ZOFRAN) IV  Mickeal Skinner, MD Pg# [phone] [facility]

## 2018-02-08 LAB — BASIC METABOLIC PANEL
ANION GAP: 12 (ref 5–15)
BUN: 8 mg/dL (ref 6–20)
CO2: 17 mmol/L — ABNORMAL LOW (ref 22–32)
Calcium: 8.1 mg/dL — ABNORMAL LOW (ref 8.9–10.3)
Chloride: 107 mmol/L (ref 101–111)
Creatinine, Ser: 0.83 mg/dL (ref 0.61–1.24)
Glucose, Bld: 98 mg/dL (ref 65–99)
POTASSIUM: 3.1 mmol/L — AB (ref 3.5–5.1)
SODIUM: 136 mmol/L (ref 135–145)

## 2018-02-08 MED ORDER — DOCUSATE SODIUM 100 MG PO CAPS
100.0000 mg | ORAL_CAPSULE | Freq: Two times a day (BID) | ORAL | Status: DC
Start: 2018-02-08 — End: 2018-02-09
  Administered 2018-02-08: 100 mg via ORAL
  Filled 2018-02-08 (×2): qty 1

## 2018-02-08 MED ORDER — POTASSIUM CHLORIDE CRYS ER 20 MEQ PO TBCR
40.0000 meq | EXTENDED_RELEASE_TABLET | Freq: Three times a day (TID) | ORAL | Status: DC
  Administered 2018-02-08 – 2018-02-09 (×3): 40 meq via ORAL
  Filled 2018-02-08 (×3): qty 2

## 2018-02-08 MED FILL — ZYTIGA 250 MG TABLET: 250 | 30 days supply | Qty: 120 | Fill #0

## 2018-02-08 NOTE — Care Management Important Message (Signed)
Important Message  Patient Details  Name: Tony [name] MRN: [id number] Date of Birth: 08/16/1941   Medicare Important Message Given:  Yes    Orbie Pyo 02/08/2018, 2:22 PM

## 2018-02-08 NOTE — Progress Notes (Signed)
[facility] Progress Note  5 Days Post-Op  Subjective: CC: no complaints Patient tolerating CLD and having BMs. BMs are loose with some formed bits. Denies abdominal pain, nausea or vomiting.  Patient's wife present at bedside.   Objective: Vital signs in last 24 hours: Temp:  [97.8 F (36.6 C)-99 F (37.2 C)] 98.2 F (36.8 C) (05/28 4034) Pulse Rate:  [66-84] 66 (05/28 0821) Resp:  [20] 20 (05/28 0614) BP: (123-138)/(83-93) 134/84 (05/28 0821) SpO2:  [92 %-96 %] 96 % (05/28 0821) Last BM Date: 02/07/18  Intake/Output from previous day: 05/27 0701 - 05/28 0700 In: 2361.7 [P.O.:1140; I.V.:1221.7] Out: -  Intake/Output this shift: Total I/O In: 320 [P.O.:320] Out: -   PE: Gen:  Alert, NAD, pleasant Card:  Regular rate and rhythm, pedal pulses 2+ BL Pulm:  Normal effort, clear to auscultation bilaterally Abd: Soft, non-tender, non-distended, bowel sounds present, no HSM, incisions C/D/I Skin: warm and dry, no rashes  Psych: A&Ox3   Lab Results:  Recent Labs    02/07/18 0219  WBC 9.5  HGB 12.3*  HCT 36.8*  PLT 241   BMET Recent Labs    02/06/18 0327 02/06/18 2002 02/07/18 0219  NA 134*  --  138  K 2.8* 3.2* 3.4*  CL 103  --  105  CO2 21*  --  23  GLUCOSE 107*  --  106*  BUN 11  --  14  CREATININE 0.74  --  0.86  CALCIUM 8.3*  --  8.3*   PT/INR No results for input(s): LABPROT, INR in the last 72 hours. CMP     Component Value Date/Time   NA 138 02/07/2018 0219   NA 141 08/27/2017 0915   K 3.4 (L) 02/07/2018 0219   K 4.8 08/27/2017 0915   CL 105 02/07/2018 0219   CO2 23 02/07/2018 0219   CO2 26 08/27/2017 0915   GLUCOSE 106 (H) 02/07/2018 0219   GLUCOSE 108 08/27/2017 0915   BUN 14 02/07/2018 0219   BUN 16.2 08/27/2017 0915   CREATININE 0.86 02/07/2018 0219   CREATININE 0.9 08/27/2017 0915   CALCIUM 8.3 (L) 02/07/2018 0219   CALCIUM 9.8 08/27/2017 0915   PROT 6.6 02/02/2018 1930   PROT 7.3 08/27/2017 0915   ALBUMIN 3.5  02/02/2018 1930   ALBUMIN 4.1 08/27/2017 0915   AST 20 02/02/2018 1930   AST 23 08/27/2017 0915   ALT 15 (L) 02/02/2018 1930   ALT 17 08/27/2017 0915   ALKPHOS 63 02/02/2018 1930   ALKPHOS 83 08/27/2017 0915   BILITOT 1.7 (H) 02/02/2018 1930   BILITOT 0.95 08/27/2017 0915   GFRNONAA >60 02/07/2018 0219   GFRAA >60 02/07/2018 0219   Lipase     Component Value Date/Time   LIPASE 21 01/21/2018 1500       Studies/Results: No results found.  Anti-infectives: Anti-infectives (From admission, onward)   Start     Dose/Rate Route Frequency Ordered Stop   02/03/18 0800  levofloxacin (LEVAQUIN) IVPB 750 mg  Status:  Discontinued     750 mg 100 mL/hr over 90 Minutes Intravenous Every 24 hours 02/02/18 1929 02/02/18 2343   02/03/18 0800  vancomycin (VANCOCIN) 1,250 mg in sodium chloride 0.9 % 250 mL IVPB  Status:  Discontinued     1,250 mg 166.7 mL/hr over 90 Minutes Intravenous Every 12 hours 02/02/18 1929 02/02/18 2343   02/03/18 0400  aztreonam (AZACTAM) 2 g in sodium chloride 0.9 % 100 mL IVPB  Status:  Discontinued  2 g 200 mL/hr over 30 Minutes Intravenous Every 8 hours 02/02/18 1929 02/02/18 2343   02/02/18 1930  vancomycin (VANCOCIN) 1,750 mg in sodium chloride 0.9 % 500 mL IVPB     1,750 mg 250 mL/hr over 120 Minutes Intravenous  Once 02/02/18 1909 02/02/18 2137   02/02/18 1915  levofloxacin (LEVAQUIN) IVPB 750 mg     750 mg 100 mL/hr over 90 Minutes Intravenous  Once 02/02/18 1902 02/02/18 2059   02/02/18 1915  aztreonam (AZACTAM) 2 g in sodium chloride 0.9 % 100 mL IVPB     2 g 200 mL/hr over 30 Minutes Intravenous  Once 02/02/18 1902 02/02/18 2136   02/02/18 1915  vancomycin (VANCOCIN) IVPB 1000 mg/200 mL premix  Status:  Discontinued     1,000 mg 200 mL/hr over 60 Minutes Intravenous  Once 02/02/18 1902 02/02/18 1909       Assessment/Plan CAD 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 Prostate cancer - s/p seed implant, on  chemotx Anxiety HLD HTN  S/P laparoscopic appendectomy 5/10 - POD#18, incisions c/d/i Gastric outlet obstruction secondary to periduodenal hematoma - patient tolerating clears and having bowel movements - will advance to full liquids, ok to discharge on fulls if tolerating and f/u in [facility] office later this week Hypokalemia - K 3.4, PO replacement  FEN: NPO, IVF; NGT to LIWS VTE: SCDs ID: [email or web address]/aztreonam/levaquin 5/22 Follow up: Dr. Kieth Brightly 5/31    LOS: 6 days    Tony [name] , [facility] 02/08/2018, 10:04 AM Pager: [phone] Consults: [phone] Mon-Fri 7:00 am-4:30 pm Sat-Sun 7:00 am-11:30 am

## 2018-02-08 NOTE — Progress Notes (Signed)
PROGRESS NOTE    Tony [name]  XTK:[id number] DOB: Feb 02, 1941 DOA: 02/02/2018 PCP: [name], [name], MD    Brief Narrative: Tony Stender Bullinsis a 77 y.o.malewith medical history significant offundectomy 12 days ago for perforated appendix, hypertension, prostate cancer is post radioactive seed implant but on oral chemotherapy, who presents at the behest of his primary care practitioner for sob, nausea and vomiting. Surgery and GI consulted. Pt underwent EGD showing reflux and erosive esophagitis and diffuse gastritis and duodenitis in the first and second part of duodenum.     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  Appreciate GI and surgery recommendations.  No nausea or vomiting so far. Advanced diet from clears to full liquids today.  Replete potassium as needed.     GOO  - possibly from duodenal edema.  - recurrent nausea and vomiting.  No nausea or vomiting today.  No abdominal pain.  Surgery recommended advancing diet to full liquids and possibly can be discharged tomorrow on full liquids if he can tolerate.    - surgery on board and no surgical intervention at this time.    Malignant neoplasm of the prostate: Further management as per Dr [name].  Continue with zytiga.    Hypertension; well controlled.    Copd: No wheezing heard. Resume home duonebs ,  Mild anemia:  Stable.     Severe hypokalemia:  - replaced. . Magnesium level wnl.  Repeat level tomorrow.     DVT prophylaxis: scd's Code Status: full code.    Family Communication:family at bedside.  Disposition Plan: home when able to tolerate soft diet.    Consultants:   Gastroenterology   Surgery.    Procedures: EGD   Antimicrobials: none.    Subjective: No nausea or vomiting.  No abd pain.   Objective: Vitals:   02/07/18 2115 02/08/18 0614 02/08/18 0821 02/08/18 1326  BP: 138/85 131/83 134/84 127/87  Pulse: 73 72 66 72  Resp: 20 20  20   Temp: 99 F (37.2 C) 98.2 F (36.8 C)  98.4 F (36.9 C)  TempSrc: Oral   Oral  SpO2: 92% 92% 96% 97%  Weight:      Height:        Intake/Output Summary (Last 24 hours) at 02/08/2018 1512 Last data filed at 02/08/2018 0900 Gross per 24 hour  Intake 930 ml  Output -  Net 930 ml   Filed Weights   02/02/18 1741 02/02/18 1941 02/03/18 1142  Weight: 90.7 kg (200 lb) 90.7 kg (200 lb) 90.7 kg (200 lb)    Examination:   General exam: alert and comfortable , not in distress,   Respiratory system: Clear to auscultation no wheezing or rhonchi .  Cardiovascular system: S1 & S2 heard, RRR. No JVD, murmurs,No pedal edema. Gastrointestinal system: Abdomen is soft non tender non distended bowel sounds heard.  Central nervous system: Alert and oriented. Non focal.  Extremities: Symmetric 5 x 5 power. Skin: No rashes, lesions or ulcers Psychiatry:  Mood & affect appropriate.     Data Reviewed: I have personally reviewed following labs  and imaging studies  CBC: Recent Labs  Lab 02/02/18 1752 02/03/18 0416 02/04/18 0917 02/07/18 0219  WBC 12.8* 10.8* 9.6 9.5  HGB 14.5 12.2* 12.3* 12.3*  HCT 42.7 36.1* 37.6* 36.8*  MCV 92.2 92.6 95.4 93.4  PLT 415* 310 276 741   Basic Metabolic Panel: Recent Labs  Lab 02/04/18 0917  02/05/18 0317 02/06/18 0327 02/06/18 2002 02/07/18 0219 02/08/18 1437  NA 141  --  138 134*  --  138 136  K 2.7*   < > 3.0* 2.8* 3.2* 3.4* 3.1*  CL 108  --  108 103  --  105 107  CO2 24  --  23 21*  --  23 17*  GLUCOSE 100*  --  97 107*  --  106* 98  BUN  16  --  13 11  --  14 8  CREATININE 0.73  --  0.79 0.74  --  0.86 0.83  CALCIUM 8.2*  --  8.0* 8.3*  --  8.3* 8.1*  MG 1.8  --   --  1.8  --   --   --    < > = values in this interval not displayed.   GFR: Estimated Creatinine Clearance: 79.6 mL/min (by C-G formula based on SCr of 0.83 mg/dL). Liver Function Tests: Recent Labs  Lab 02/02/18 1930  AST 20  ALT 15*  ALKPHOS 63  BILITOT 1.7*  PROT 6.6  ALBUMIN 3.5   No results for input(s): LIPASE, AMYLASE in the last 168 hours. No results for input(s): AMMONIA in the last 168 hours. Coagulation Profile: No results for input(s): INR, PROTIME in the last 168 hours. Cardiac Enzymes: No results for input(s): CKTOTAL, CKMB, CKMBINDEX, TROPONINI in the last 168 hours. BNP (last 3 results) No results for input(s): PROBNP in the last 8760 hours. HbA1C: No results for input(s): HGBA1C in the last 72 hours. CBG: No results for input(s): GLUCAP in the last 168 hours. Lipid Profile: No results for input(s): CHOL, HDL, LDLCALC, TRIG, CHOLHDL, LDLDIRECT in the last 72 hours. Thyroid Function Tests: No results for input(s): TSH, T4TOTAL, FREET4, T3FREE, THYROIDAB in the last 72 hours. Anemia Panel: No results for input(s): VITAMINB12, FOLATE, FERRITIN, TIBC, IRON, RETICCTPCT in the last 72 hours. Sepsis Labs: Recent Labs  Lab 02/02/18 1828 02/02/18 1947 02/03/18 0417  LATICACIDVEN 2.59* 2.20* 1.3    Recent Results (from the past 240 hour(s))  Blood culture (routine x 2)     Status: None   Collection Time: 02/02/18  7:30 PM  Result Value Ref Range Status   Specimen Description BLOOD LEFT ARM  Final   Special Requests   Final    BOTTLES DRAWN AEROBIC AND ANAEROBIC Blood Culture adequate volume   Culture   Final    NO GROWTH 5 DAYS Performed at [facility], [place] [address] [place], [place] [address]    Report Status 02/07/2018 FINAL  Final  Blood culture (routine x 2)     Status: None   Collection Time: 02/02/18  7:33 PM   Result Value Ref Range Status   Specimen Description BLOOD RIGHT HAND  Final   Special Requests   Final    BOTTLES DRAWN AEROBIC AND ANAEROBIC Blood Culture adequate volume   Culture   Final    NO GROWTH 5 DAYS Performed at [facility], [place] [address], [place], [place] [address]    Report Status 02/07/2018 FINAL  Final         Radiology  Studies: No results found.      Scheduled Meds: . abiraterone acetate  1,000 mg Oral QAC breakfast  . docusate sodium  100 mg Oral BID  . feeding supplement (ENSURE ENLIVE)  237 mL Oral BID BM  . guaiFENesin  600 mg Oral BID  . metoCLOPramide (REGLAN) injection  5 mg Intravenous Q6H  . pantoprazole  40 mg Oral BID  . potassium chloride  40 mEq Oral BID   Continuous Infusions: . sodium chloride 100 mL/hr at 02/08/18 1133     LOS: 6 days    Time spent: 35 minutes.     Hosie Poisson, MD [facility] Pager [phone]   If 7PM-7AM, please contact night-coverage [email or web address] Password [email or web address] 02/08/2018, 3:12 PM

## 2018-02-09 DIAGNOSIS — C61 Malignant neoplasm of prostate: Secondary | ICD-10-CM

## 2018-02-09 DIAGNOSIS — J439 Emphysema, unspecified: Secondary | ICD-10-CM

## 2018-02-09 LAB — BASIC METABOLIC PANEL
ANION GAP: 9 (ref 5–15)
Anion gap: 8 (ref 5–15)
BUN: 7 mg/dL (ref 6–20)
BUN: 6 mg/dL (ref 6–20)
CHLORIDE: 104 mmol/L (ref 101–111)
CHLORIDE: 104 mmol/L (ref 101–111)
CO2: 23 mmol/L (ref 22–32)
CO2: 24 mmol/L (ref 22–32)
CREATININE: 0.75 mg/dL (ref 0.61–1.24)
Calcium: 8.1 mg/dL — ABNORMAL LOW (ref 8.9–10.3)
Calcium: 8.2 mg/dL — ABNORMAL LOW (ref 8.9–10.3)
Creatinine, Ser: 0.72 mg/dL (ref 0.61–1.24)
GFR calc Af Amer: >60 mL/min (ref >60)
GFR calc Af Amer: >60 mL/min (ref >60)
GFR calc non Af Amer: >60 mL/min (ref >60)
GFR calc non Af Amer: >60 mL/min (ref >60)
GLUCOSE: 96 mg/dL (ref 65–99)
Glucose, Bld: 109 mg/dL — ABNORMAL HIGH (ref 65–99)
POTASSIUM: 3.1 mmol/L — AB (ref 3.5–5.1)
POTASSIUM: 3.1 mmol/L — AB (ref 3.5–5.1)
Sodium: 136 mmol/L (ref 135–145)
Sodium: 136 mmol/L (ref 135–145)

## 2018-02-09 LAB — MAGNESIUM: Magnesium: 1.6 mg/dL — ABNORMAL LOW (ref 1.7–2.4)

## 2018-02-09 MED ORDER — METOCLOPRAMIDE HCL 10 MG PO TABS: 10.0000 mg | ORAL_TABLET | Freq: Four times a day (QID) | ORAL | 0 refills | Status: DC | PRN

## 2018-02-09 MED ORDER — PANTOPRAZOLE SODIUM 40 MG PO TBEC: 40.0000 mg | DELAYED_RELEASE_TABLET | Freq: Two times a day (BID) | ORAL | 0 refills | Status: DC

## 2018-02-09 MED ORDER — MAGNESIUM OXIDE 400 (241.3 MG) MG PO TABS
400.0000 mg | ORAL_TABLET | Freq: Two times a day (BID) | ORAL | Status: DC
  Administered 2018-02-09: 400 mg via ORAL
  Filled 2018-02-09: qty 1

## 2018-02-09 MED ORDER — MAGNESIUM OXIDE 400 (241.3 MG) MG PO TABS: 400.0000 mg | ORAL_TABLET | Freq: Two times a day (BID) | ORAL | 0 refills | Status: DC

## 2018-02-09 MED ORDER — METOCLOPRAMIDE HCL 10 MG PO TABS: 5.0000 mg | ORAL_TABLET | Freq: Four times a day (QID) | ORAL | Status: DC | PRN

## 2018-02-09 MED ORDER — POTASSIUM CHLORIDE CRYS ER 20 MEQ PO TBCR: 20.0000 meq | EXTENDED_RELEASE_TABLET | Freq: Two times a day (BID) | ORAL | 0 refills | Status: DC

## 2018-02-09 NOTE — Progress Notes (Signed)
[facility] Progress Note  6 Days Post-Op  Subjective: CC: diarrhea Patient complaining of diarrhea. Denies abdominal pain or nausea. Tolerated FLD although not eating much. Does not like the food here.  Objective: Vital signs in last 24 hours: Temp:  [98.4 F (36.9 C)-100.2 F (37.9 C)] 98.9 F (37.2 C) (05/29 0431) Pulse Rate:  [70-81] 70 (05/29 0431) Resp:  [18-20] 18 (05/29 0431) BP: (127-130)/(87-93) 130/88 (05/29 0431) SpO2:  [92 %-97 %] 92 % (05/29 0431) Last BM Date: 02/08/18  Intake/Output from previous day: 05/28 0701 - 05/29 0700 In: 860 [P.O.:860] Out: -  Intake/Output this shift: No intake/output data recorded.  PE: Gen:  Alert, NAD, pleasant Card:  Regular rate and rhythm, pedal pulses 2+ BL Pulm:  Normal effort, clear to auscultation bilaterally Abd: Soft, non-tender, non-distended, bowel sounds present, no HSM, incisions C/D/I  Skin: warm and dry, no rashes  Psych: A&Ox3     Lab Results:  Recent Labs    02/07/18 0219  WBC 9.5  HGB 12.3*  HCT 36.8*  PLT 241   BMET Recent Labs    02/08/18 1437 02/09/18 0357  NA 136 136  K 3.1* 3.1*  CL 107 104  CO2 17* 23  GLUCOSE 98 96  BUN 8 7  CREATININE 0.83 0.72  CALCIUM 8.1* 8.1*   PT/INR No results for input(s): LABPROT, INR in the last 72 hours. CMP     Component Value Date/Time   NA 136 02/09/2018 0357   NA 141 08/27/2017 0915   K 3.1 (L) 02/09/2018 0357   K 4.8 08/27/2017 0915   CL 104 02/09/2018 0357   CO2 23 02/09/2018 0357   CO2 26 08/27/2017 0915   GLUCOSE 96 02/09/2018 0357   GLUCOSE 108 08/27/2017 0915   BUN 7 02/09/2018 0357   BUN 16.2 08/27/2017 0915   CREATININE 0.72 02/09/2018 0357   CREATININE 0.9 08/27/2017 0915   CALCIUM 8.1 (L) 02/09/2018 0357   CALCIUM 9.8 08/27/2017 0915   PROT 6.6 02/02/2018 1930   PROT 7.3 08/27/2017 0915   ALBUMIN 3.5 02/02/2018 1930   ALBUMIN 4.1 08/27/2017 0915   AST 20 02/02/2018 1930   AST 23 08/27/2017 0915   ALT 15 (L)  02/02/2018 1930   ALT 17 08/27/2017 0915   ALKPHOS 63 02/02/2018 1930   ALKPHOS 83 08/27/2017 0915   BILITOT 1.7 (H) 02/02/2018 1930   BILITOT 0.95 08/27/2017 0915   GFRNONAA >60 02/09/2018 0357   GFRAA >60 02/09/2018 0357   Lipase     Component Value Date/Time   LIPASE 21 01/21/2018 1500       Studies/Results: No results found.  Anti-infectives: Anti-infectives (From admission, onward)   Start     Dose/Rate Route Frequency Ordered Stop   02/03/18 0800  levofloxacin (LEVAQUIN) IVPB 750 mg  Status:  Discontinued     750 mg 100 mL/hr over 90 Minutes Intravenous Every 24 hours 02/02/18 1929 02/02/18 2343   02/03/18 0800  vancomycin (VANCOCIN) 1,250 mg in sodium chloride 0.9 % 250 mL IVPB  Status:  Discontinued     1,250 mg 166.7 mL/hr over 90 Minutes Intravenous Every 12 hours 02/02/18 1929 02/02/18 2343   02/03/18 0400  aztreonam (AZACTAM) 2 g in sodium chloride 0.9 % 100 mL IVPB  Status:  Discontinued     2 g 200 mL/hr over 30 Minutes Intravenous Every 8 hours 02/02/18 1929 02/02/18 2343   02/02/18 1930  vancomycin (VANCOCIN) 1,750 mg in sodium chloride 0.9 % 500  mL IVPB     1,750 mg 250 mL/hr over 120 Minutes Intravenous  Once 02/02/18 1909 02/02/18 2137   02/02/18 1915  levofloxacin (LEVAQUIN) IVPB 750 mg     750 mg 100 mL/hr over 90 Minutes Intravenous  Once 02/02/18 1902 02/02/18 2059   02/02/18 1915  aztreonam (AZACTAM) 2 g in sodium chloride 0.9 % 100 mL IVPB     2 g 200 mL/hr over 30 Minutes Intravenous  Once 02/02/18 1902 02/02/18 2136   02/02/18 1915  vancomycin (VANCOCIN) IVPB 1000 mg/200 mL premix  Status:  Discontinued     1,000 mg 200 mL/hr over 60 Minutes Intravenous  Once 02/02/18 1902 02/02/18 1909       Assessment/Plan CAD 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 Prostate cancer - s/p seed implant, on chemotx Anxiety HLD HTN  S/P laparoscopic appendectomy 5/10- POD#19, incisions c/d/i Gastric outlet obstruction secondary  to periduodenal hematoma - patient tolerating FLD and having bowel movements - continue full liquids, ok to discharge on fulls if tolerating and f/u in [facility] office later this week Hypokalemia- K 3.1, replace and recheck this afternoon  FEN: FLD VTE: SCDs ID: [email or web address] 5/22 Follow up: Dr. Art Buff to discharge home from a surgery standpoint if K improved this afternoon and tolerating FLD  LOS: 7 days    Brigid Re , [facility] 02/09/2018, 9:25 AM Pager: [phone] Consults: [phone] Mon-Fri 7:00 am-4:30 pm Sat-Sun 7:00 am-11:30 am

## 2018-02-09 NOTE — Consult Note (Signed)
   [facility] CM Inpatient Consult   02/09/2018  Clebert Wenger Mota 1940-12-14 [id number]  Patient screened for re-admission in the [place] [facility] services. Patient is in the [facility] of the [place] [facility] services under patient's [employer] plan.  Met with the patient and his wife [name] who states they are awaiting lab results and some DME.  Explained [place] [facility].  Patient states he ate a little today and tolerated his Ensure.  They endorses Dr. [name] as the primary care provider at [facility], in which this office provides the transition of care calls and follow up. Patient and wil agrees to follow up [place] calls, general.  A brochure and 24 hour nurse advise line was given. Patient and wife have old flip phones but their daughter could possibly be interested in the virtual visit as she has a phone with a camera. Spoke with inpatient RNCM regarding equipment and THN with EMMI.  Ffor questions contact:   Natividad Brood, RN BSN [employer] [facility] Liaison  [phone] business mobile phone Toll free office [phone]

## 2018-02-09 NOTE — Progress Notes (Signed)
Results for KYHEEM, BATHGATE (MRN [id number]) as of 02/09/2018 08:09  Ref. Range 02/09/2018 03:57  Potassium Latest Ref Range: 3.5 - 5.1 mmol/L 3.1 (L)  Night nurse reported that patient had 6 bowel movements in last 24 hours and has been receiving regaln.  Paged provider to make aware.

## 2018-02-09 NOTE — Progress Notes (Signed)
Results for Tony [name], Tony [name] (MRN [id number]) as of 02/09/2018 14:37  Ref. Range 02/09/2018 13:47  Potassium Latest Ref Range: 3.5 - 5.1 mmol/L 3.1 (L)  Provider text paged to make aware.

## 2018-02-09 NOTE — Discharge Summary (Signed)
Physician Discharge Summary  Tony [name] EVO:[id number] DOB: 05-09-1941 DOA: 02/02/2018  PCP: [name], [name], MD  Admit date: 02/02/2018 Discharge date: 02/09/2018  Admitted From: Home Disposition: Home   Recommendations for Outpatient Follow-up:  1. Follow up with general surgery 5/31 2. Follow up with PCP in the next week 3. Please check BMP and magnesium at follow up and treat as indicated.  Home Health: None Equipment/Devices: 3 in 1, walker Discharge Condition: Stable CODE STATUS: Full Diet recommendation: Full liquids  Brief/Interim Summary: Tony Packman Bullinsis a 77 y.o.malewith medical history significant offundectomy 12 days ago for perforated appendix, hypertension, prostate cancer is post radioactive seed implant but on oral chemotherapy, who presents at the behest of his primary care practitionerfor sob, nausea and vomiting. Surgery and GI consulted. Pt underwent EGD showing reflux and erosive esophagitis and diffuse gastritis and duodenitis in the first and second part of duodenum. Diet was slowly advanced to full liquids with resolution of symptoms with prn reglan. Hypokalemia continued to be a struggle though improved with replacement. This will need to be monitored at follow up.   Discharge Diagnoses:  Principal Problem:   Complete gastric outlet obstruction Active Problems:   Malignant neoplasm of prostate (HCC)   Anxiety disorder   Atherosclerosis of coronary artery without angina pectoris   Hypercholesteremia   Essential hypertension, benign   Aneurysm of thoracic aorta (HCC)   Atrophic pancreas   Emphysema lung (HCC)  LA Grade B reflux and erosive esophagitis, gastritis, acute duodenitis: Noted on EGD 5/23.  - Continue PPI BID - Avoid NSAIDs and alcohol - Continue liquid diet until follow up. - Gastric outlet obstruction due to duodenitis suspected, though symptoms improving with advancement of diet. Ok to DC per general  surgery  Hypokalemia: Improved. Will continue po supplement with magnesium BID  - Recommend recheck at follow up  Malignant neoplasm of the prostate: - Continue zytiga.   Hypertension: At goal - Continue low dose norvasc  COPD: No wheezing or dyspnea.  - Home medications  Anemia: Mild. Stable.  - Outpatient follow up.   Discharge Instructions Discharge Instructions    Diet - low sodium heart healthy   Complete by:  As directed    Discharge instructions   Complete by:  As directed    Keep taking full liquids/broths/yogurts, etc. until you follow up with the surgeon on Friday.  - You can take reglan as needed for nausea and or vomiting. You should not take this long term without speaking with your doctor.  - Avoid NSAIDs (ibuprofen, advil, motrin, aleve, naproxen, goody's, etc.)  - Take protonix twice daily to help heal the esophagitis.  - Take potassium and magnesium twice daily until you get labs rechecked, then follow the advice of your doctor.  - Seek medical attention if you are unable to eat or drink, have a fever, worsening pain, trouble breathing or chest pain.   Increase activity slowly   Complete by:  As directed      Allergies as of 02/09/2018      Reactions   Penicillins Rash   Has patient had a PCN reaction causing immediate rash, facial/tongue/throat swelling, SOB or lightheadedness with hypotension: Yes Has patient had a PCN reaction causing severe rash involving mucus membranes or skin necrosis: No Has patient had a PCN reaction that required hospitalization: No Has patient had a PCN reaction occurring within the last 10 years: No If all of the above answers are "NO", then may proceed with Cephalosporin  use.      Medication List    STOP taking these medications   naproxen sodium 220 MG tablet Commonly known as:  ALEVE     TAKE these medications   acetaminophen 325 MG tablet Commonly known as:  TYLENOL Take 2 tablets (650 mg total) by mouth every 6  (six) hours as needed for mild pain (or Fever >/= 101).   amLODipine 2.5 MG tablet Commonly known as:  NORVASC Take 1 tablet (2.5 mg total) by mouth daily.   aspirin EC 81 MG tablet Take 81 mg by mouth daily.   fluticasone 50 MCG/ACT nasal spray Commonly known as:  FLONASE Place 1 spray into both nostrils daily as needed for allergies or rhinitis.   LORazepam 1 MG tablet Commonly known as:  ATIVAN TAKE 1/2 TO 1 TABLET DAILY AS NEEDED What changed:  See the new instructions.   magnesium oxide 400 (241.3 Mg) MG tablet Commonly known as:  MAG-OX Take 1 tablet (400 mg total) by mouth 2 (two) times daily.   metoCLOPramide 10 MG tablet Commonly known as:  REGLAN Take 1 tablet (10 mg total) by mouth every 6 (six) hours as needed for nausea or vomiting.   multivitamin with minerals Tabs tablet Take 1 tablet by mouth daily.   pantoprazole 40 MG tablet Commonly known as:  PROTONIX Take 1 tablet (40 mg total) by mouth 2 (two) times daily.   polyethylene glycol packet Commonly known as:  MIRALAX / GLYCOLAX Take 17 g by mouth daily as needed for severe constipation.   potassium chloride SA 20 MEQ tablet Commonly known as:  K-DUR,KLOR-CON Take 1 tablet (20 mEq total) by mouth 2 (two) times daily.   predniSONE 5 MG tablet Commonly known as:  DELTASONE Take 1 tablet (5 mg total) by mouth daily with breakfast.   psyllium 28 % packet Commonly known as:  METAMUCIL SMOOTH TEXTURE Take 1 packet by mouth 2 (two) times daily. What changed:    when to take this  reasons to take this   traMADol 50 MG tablet Commonly known as:  ULTRAM Take 1 tablet (50 mg total) by mouth every 6 (six) hours as needed (mild pain).   traZODone 50 MG tablet Commonly known as:  DESYREL Take 0.5-1 tablets (25-50 mg total) by mouth at bedtime as needed for sleep. What changed:    how much to take  when to take this   ZYTIGA 250 MG tablet Generic drug:  abiraterone acetate TAKE 4 TABLETS (1000 MG)  BY MOUTH DAILY. TAKE ON AN EMPTY STOMACH 1 HOUR BEFORE OR 2 HOURS AFTER A MEAL            Durable Medical Equipment  (From admission, onward)        Start     Ordered   02/09/18 1528  For home use only DME 3 n 1  Once     02/09/18 1528   02/09/18 1528  For home use only DME Walker rolling  Once    Question:  Patient needs a walker to treat with the following condition  Answer:  Weakness   02/09/18 1528     Follow-up Information    Kinsinger, Arta Bruce, MD. Go on 02/23/2018.   Specialty:  General Surgery Why:  Follow up. Call to confirm appointment time. Should remain on full liquid diet until follow up appointment.  Contact information: [address] [place] [address] [phone]        [name], Betty  G, MD. Schedule an appointment as soon as possible for a visit in 1 week(s).   Specialty:  Family Medicine Contact information: [address] [place] [address] [phone]          Allergies  Allergen Reactions  . Penicillins Rash    Has patient had a PCN reaction causing immediate rash, facial/tongue/throat swelling, SOB or lightheadedness with hypotension: Yes Has patient had a PCN reaction causing severe rash involving mucus membranes or skin necrosis: No Has patient had a PCN reaction that required hospitalization: No Has patient had a PCN reaction occurring within the last 10 years: No If all of the above answers are "NO", then may proceed with Cephalosporin use.    Consultations:  GI, Dr. Watt Climes  General surgery  Procedures/Studies: Dg Chest 2 View  Result Date: 02/02/2018 CLINICAL DATA:  Short of breath EXAM: CHEST - 2 VIEW COMPARISON:  05/22/2014 FINDINGS: Hyperinflation with chronic bronchitic changes. No acute consolidation or effusion. Mild cardiomegaly. No pneumothorax. IMPRESSION: 1. Chronic appearing bronchitic changes without acute pulmonary infiltrate 2. Cardiomegaly Electronically Signed   By: Donavan Foil M.D.    On: 02/02/2018 18:23   Ct Angio Chest Pe W And/or Wo Contrast  Result Date: 02/02/2018 CLINICAL DATA:  Shortness of breath tachypnea and vomiting. Laboratory evidence of sepsis. Recent laparoscopic appendectomy for acute appendicitis on 01/21/2018. EXAM: CT ANGIOGRAPHY CHEST CT ABDOMEN AND PELVIS WITH CONTRAST TECHNIQUE: Multidetector CT imaging of the chest was performed using the standard protocol during bolus administration of intravenous contrast. Multiplanar CT image reconstructions and MIPs were obtained to evaluate the vascular anatomy. Multidetector CT imaging of the abdomen and pelvis was performed using the standard protocol during bolus administration of intravenous contrast. CONTRAST:  100 mL ISOVUE-370 IOPAMIDOL (ISOVUE-370) INJECTION IV COMPARISON:  CT of the abdomen and pelvis on 01/21/2018 FINDINGS: CTA CHEST FINDINGS Cardiovascular: Satisfactory opacification of the pulmonary arteries to the segmental level. No evidence of pulmonary embolism. Normal heart size. No pericardial effusion. Coronary atherosclerosis evident with calcified plaque in a 3 vessel distribution. The aortic root is mildly dilated and measures 4.2 cm in diameter at the level of the sinuses of Valsalva. There is dilatation of the ascending thoracic aorta which measures 4.7 cm in greatest diameter. The aorta reaches a normal caliber of 3.7 cm at the level of the aortic arch. No evidence to suggest aortic dissection. Visualized proximal great vessels show normally patent origins. Mediastinum/Nodes: The esophagus is mildly distended and contains an air-fluid level in its mid segment and fluid in its distal segment. 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 No evidence of pulmonary consolidation, mass, pneumothorax, edema or pleural fluid. Musculoskeletal: No chest wall abnormality. No acute or significant osseous findings. Review of the MIP images confirms the above findings. CT  ABDOMEN and PELVIS FINDINGS Hepatobiliary: No focal liver abnormality is seen. No gallstones, gallbladder wall thickening, or biliary dilatation. Pancreas: Continued atrophic appearance of the pancreas which is infiltrated with fat. Spleen: Normal in size without focal abnormality. Adrenals/Urinary Tract: Adrenal glands are unremarkable. Kidneys are normal, without renal calculi, focal lesion, or hydronephrosis. Bladder is unremarkable. Stomach/Bowel: There is thickening involving the distal descending portion of the duodenum as well as the third portion of the duodenum. This is associated with a significant large area of inflammation in the retroperitoneum with complex fluid present over an area measuring approximately 8 cm in transverse diameter and up to 13 cm in height. This was not present on the recent  CT on 05/10. Differential considerations include inflammatory process of the duodenum such as ulcer disease with perforation and surrounding inflammation and also hemorrhage in this region. This is removed from the head of the pancreas and acute pancreatitis is felt much less likely to be a possibility for findings. There likely is some degree of narrowing of the duodenal lumen at this level as the proximal duodenum and stomach are distended and filled with fluid likely reflecting relative outlet obstruction. No evidence of extraluminal air. No small bowel or colonic dilatation is identified distal to the duodenum. No evidence of postoperative complication at the site of appendectomy. No focal abscess identified. Vascular/Lymphatic: No significant vascular findings are present. No enlarged abdominal or pelvic lymph nodes. Reproductive: Brachytherapy seeds are present in the prostate gland. Other: Bilateral inguinal hernias contain fat, right greater than left. Musculoskeletal: No acute or significant osseous findings. Review of the MIP images confirms the above findings. IMPRESSION: 1. No acute findings in the  chest. Incidental note is made of aneurysmal disease of the thoracic aorta with maximal caliber of 4.7 cm at the level of the ascending thoracic aorta. Ascending thoracic aortic aneurysm. Recommend semi-annual imaging followup by CTA or MRA and referral to cardiothoracic surgery if not already obtained. This recommendation follows 2010 ACCF/AHA/AATS/ACR/ASA/SCA/SCAI/SIR/STS/SVM Guidelines for the Diagnosis and Management of Patients With Thoracic Aortic Disease. Circulation. 2010; 121: J681-L572 2. Coronary atherosclerosis with calcified plaque in a 3 vessel distribution. 3. New significant inflammatory process versus hemorrhage surrounding the descending duodenum in the retroperitoneum. This is likely causing relative obstruction of the duodenal lumen with resultant significant distention of the stomach and distal esophagus with fluid. The patient may benefit from nasogastric decompression of the stomach. It is somewhat unclear what the etiology of the retroperitoneal findings are. This may be on the basis of an inflammatory process such as duodenitis/ruptured duodenal ulcer versus retroperitoneal hemorrhage surrounding the duodenum. Pancreatitis involving the lateral inferior pancreatic head is felt to be much less likely given location of the dominant retroperitoneal abnormality. Electronically Signed   By: Aletta Edouard M.D.   On: 02/02/2018 21:05   Ct Abdomen Pelvis W Contrast  Result Date: 02/02/2018 CLINICAL DATA:  Shortness of breath tachypnea and vomiting. Laboratory evidence of sepsis. Recent laparoscopic appendectomy for acute appendicitis on 01/21/2018. EXAM: CT ANGIOGRAPHY CHEST CT ABDOMEN AND PELVIS WITH CONTRAST TECHNIQUE: Multidetector CT imaging of the chest was performed using the standard protocol during bolus administration of intravenous contrast. Multiplanar CT image reconstructions and MIPs were obtained to evaluate the vascular anatomy. Multidetector CT imaging of the abdomen and pelvis  was performed using the standard protocol during bolus administration of intravenous contrast. CONTRAST:  100 mL ISOVUE-370 IOPAMIDOL (ISOVUE-370) INJECTION IV COMPARISON:  CT of the abdomen and pelvis on 01/21/2018 FINDINGS: CTA CHEST FINDINGS Cardiovascular: Satisfactory opacification of the pulmonary arteries to the segmental level. No evidence of pulmonary embolism. Normal heart size. No pericardial effusion. Coronary atherosclerosis evident with calcified plaque in a 3 vessel distribution. The aortic root is mildly dilated and measures 4.2 cm in diameter at the level of the sinuses of Valsalva. There is dilatation of the ascending thoracic aorta which measures 4.7 cm in greatest diameter. The aorta reaches a normal caliber of 3.7 cm at the level of the aortic arch. No evidence to suggest aortic dissection. Visualized proximal great vessels show normally patent origins. Mediastinum/Nodes: The esophagus is mildly distended and contains an air-fluid level in its mid segment and fluid in its distal segment. Lungs/Pleura: Emphysematous  lung disease present predominantly in the upper lung zones. No evidence of pulmonary consolidation, mass, pneumothorax, edema or pleural fluid. Musculoskeletal: No chest wall abnormality. No acute or significant osseous findings. Review of the MIP images confirms the above findings. CT ABDOMEN and PELVIS FINDINGS Hepatobiliary: No focal liver abnormality is seen. No gallstones, gallbladder wall thickening, or biliary dilatation. Pancreas: Continued atrophic appearance of the pancreas which is infiltrated with fat. Spleen: Normal in size without focal abnormality. Adrenals/Urinary Tract: Adrenal glands are unremarkable. Kidneys are normal, without renal calculi, focal lesion, or hydronephrosis. Bladder is unremarkable. Stomach/Bowel: There is thickening involving the distal descending portion of the duodenum as well as the third portion of the duodenum. This is associated with a  significant large area of inflammation in the retroperitoneum with complex fluid present over an area measuring approximately 8 cm in transverse diameter and up to 13 cm in height. This was not present on the recent CT on 05/10. Differential considerations include inflammatory process of the duodenum such as ulcer disease with perforation and surrounding inflammation and also hemorrhage in this region. This is removed from the head of the pancreas and acute pancreatitis is felt much less likely to be a possibility for findings. There likely is some degree of narrowing of the duodenal lumen at this level as the proximal duodenum and stomach are distended and filled with fluid likely reflecting relative outlet obstruction. No evidence of extraluminal air. No small bowel or colonic dilatation is identified distal to the duodenum. No evidence of postoperative complication at the site of appendectomy. No focal abscess identified. Vascular/Lymphatic: No significant vascular findings are present. No enlarged abdominal or pelvic lymph nodes. Reproductive: Brachytherapy seeds are present in the prostate gland. Other: Bilateral inguinal hernias contain fat, right greater than left. Musculoskeletal: No acute or significant osseous findings. Review of the MIP images confirms the above findings. IMPRESSION: 1. No acute findings in the chest. Incidental note is made of aneurysmal disease of the thoracic aorta with maximal caliber of 4.7 cm at the level of the ascending thoracic aorta. Ascending thoracic aortic aneurysm. Recommend semi-annual imaging followup by CTA or MRA and referral to cardiothoracic surgery if not already obtained. This recommendation follows 2010 ACCF/AHA/AATS/ACR/ASA/SCA/SCAI/SIR/STS/SVM Guidelines for the Diagnosis and Management of Patients With Thoracic Aortic Disease. Circulation. 2010; 121: V400-Q676 2. Coronary atherosclerosis with calcified plaque in a 3 vessel distribution. 3. New significant  inflammatory process versus hemorrhage surrounding the descending duodenum in the retroperitoneum. This is likely causing relative obstruction of the duodenal lumen with resultant significant distention of the stomach and distal esophagus with fluid. The patient may benefit from nasogastric decompression of the stomach. It is somewhat unclear what the etiology of the retroperitoneal findings are. This may be on the basis of an inflammatory process such as duodenitis/ruptured duodenal ulcer versus retroperitoneal hemorrhage surrounding the duodenum. Pancreatitis involving the lateral inferior pancreatic head is felt to be much less likely given location of the dominant retroperitoneal abnormality. Electronically Signed   By: Aletta Edouard M.D.   On: 02/02/2018 21:05   Ct Abdomen Pelvis W Contrast  Result Date: 01/21/2018 CLINICAL DATA:  77 year old male with acute RIGHT abdominal and pelvic pain for 3 days. History of prostate cancer. EXAM: CT ABDOMEN AND PELVIS WITH CONTRAST TECHNIQUE: Multidetector CT imaging of the abdomen and pelvis was performed using the standard protocol following bolus administration of intravenous contrast. CONTRAST:  151mL ISOVUE-300 IOPAMIDOL (ISOVUE-300) INJECTION 61% COMPARISON:  02/26/2016 and 02/22/2014 CTs. FINDINGS: Lower chest: No acute abnormality.  Cardiomegaly and coronary artery and thoracic aortic atherosclerotic calcifications noted. Hepatobiliary: Probable small RIGHT hepatic cysts again noted. No new or suspicious hepatic abnormalities noted. The gallbladder is unremarkable. No biliary dilatation. Pancreas: Atrophic but otherwise unremarkable. Spleen: Unremarkable Adrenals/Urinary Tract: Mid RIGHT renal scarring and LEFT LOWER pole renal cyst again noted. There is no evidence of hydronephrosis. The adrenal glands and bladder are unremarkable. Stomach/Bowel: An enlarged inflamed appendix with moderate adjacent inflammation is identified and compatible with acute  appendicitis. No abscess, pneumoperitoneum or bowel obstruction. No other bowel abnormalities identified. 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 Aortic atherosclerotic calcifications again noted. No enlarged lymph nodes identified. Reproductive: Prostate seeds are present. Other: No ascites identified. A moderate RIGHT inguinal hernia containing fat is unchanged. Musculoskeletal: No acute abnormalities. No suspicious focal bony lesions are present. 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 IMPRESSION: 1. Acute appendicitis with moderate periappendiceal inflammation. No evidence of free fluid, abscess or pneumoperitoneum. 2. Unchanged 3.1 cm abdominal aortic aneurysm. Recommend followup by ultrasound in 3 years. This recommendation follows ACR consensus guidelines: White Paper of the ACR Incidental Findings Committee II on Vascular Findings. J Am Coll Radiol 2013; 00:867-619 3. Cardiomegaly and coronary artery disease. 4. Unchanged moderate RIGHT inguinal hernia containing fat. Critical Value/emergent results were called by telephone at the time of interpretation on 01/21/2018 at 1:42 pm to Dr. [name] , who verbally acknowledged these results. Electronically Signed   By: Margarette Canada M.D.   On: 01/21/2018 13:42    EGD 02/03/2018 by Dr. Watt Climes: Impression:               - Normal larynx.                           - LA Grade B reflux and erosive esophagitis.                           - Gastritis.                           - Acute duodenitis.                           - Normal third portion of the duodenum and fourth                            portion of the duodenum.                           - The examination was otherwise normal.                           - Small hiatal hernia.                           - No specimens collected. Recommendation:           - Patient has a contact number available for                             emergencies. The signs and symptoms of potential  delayed complications were discussed with the                            patient. Return to normal activities tomorrow.                            Written discharge instructions were provided to the                            patient.                           - Clear liquid diet today. probably leave on clear                            liquids for a few days                           - No aspirin, ibuprofen, naproxen, or other                            non-steroidal anti-inflammatory drugs long-term                            Tylenol only                           - Return to GI clinic PRN. repeat endoscopy when                            necessary and consider to document healing in a few                            months                           - Telephone GI clinic if symptomatic PRN.                           - Use Protonix (pantoprazole) 40 mg PO BID today.                            however if nausea vomiting continues will need IV    Subjective: Feels well. Had BM, eating better but doesn't like the options here. No abd pain, N/V.   Discharge Exam: Vitals:   02/09/18 0431 02/09/18 1256  BP: 130/88 (!) 132/95  Pulse: 70 90  Resp: 18 18  Temp: 98.9 F (37.2 C) 98.2 F (36.8 C)  SpO2: 92% 97%   General: Pt is alert, awake, not in acute distress Cardiovascular: RRR, S1/S2 +, no rubs, no gallops Respiratory: CTA bilaterally, no wheezing, no rhonchi Abdominal: Soft, NT, ND, bowel sounds + Extremities: No edema, no cyanosis Skin: Lap incision sites c/d/i.  Labs: BNP (last 3 results) Recent Labs    02/02/18 1930  BNP 347.4*   Basic Metabolic Panel: Recent Labs  Lab 02/04/18 0917  02/06/18 0327 02/06/18 2002 02/07/18 0219 02/08/18 1437  02/09/18 0357 02/09/18 1347  NA 141   < > 134*  --  138 136 136 136  K 2.7*   < > 2.8* 3.2* 3.4* 3.1* 3.1* 3.1*  CL 108   < > 103  --  105 107  104 104  CO2 24   < > 21*  --  23 17* 23 24  GLUCOSE 100*   < > 107*  --  106* 98 96 109*  BUN 16   < > 11  --  14 8 7 6   CREATININE 0.73   < > 0.74  --  0.86 0.83 0.72 0.75  CALCIUM 8.2*   < > 8.3*  --  8.3* 8.1* 8.1* 8.2*  MG 1.8  --  1.8  --   --   --  1.6*  --    < > = values in this interval not displayed.   Liver Function Tests: Recent Labs  Lab 02/02/18 1930  AST 20  ALT 15*  ALKPHOS 63  BILITOT 1.7*  PROT 6.6  ALBUMIN 3.5   No results for input(s): LIPASE, AMYLASE in the last 168 hours. No results for input(s): AMMONIA in the last 168 hours. CBC: Recent Labs  Lab 02/02/18 1752 02/03/18 0416 02/04/18 0917 02/07/18 0219  WBC 12.8* 10.8* 9.6 9.5  HGB 14.5 12.2* 12.3* 12.3*  HCT 42.7 36.1* 37.6* 36.8*  MCV 92.2 92.6 95.4 93.4  PLT 415* 310 276 241   Cardiac Enzymes: No results for input(s): CKTOTAL, CKMB, CKMBINDEX, TROPONINI in the last 168 hours. BNP: Invalid input(s): POCBNP CBG: No results for input(s): GLUCAP in the last 168 hours. D-Dimer No results for input(s): DDIMER in the last 72 hours. Hgb A1c No results for input(s): HGBA1C in the last 72 hours. Lipid Profile No results for input(s): CHOL, HDL, LDLCALC, TRIG, CHOLHDL, LDLDIRECT in the last 72 hours. Thyroid function studies No results for input(s): TSH, T4TOTAL, T3FREE, THYROIDAB in the last 72 hours.  Invalid input(s): FREET3 Anemia work up No results for input(s): VITAMINB12, FOLATE, FERRITIN, TIBC, IRON, RETICCTPCT in the last 72 hours. Urinalysis    Component Value Date/Time   COLORURINE YELLOW 02/02/2018 2156   APPEARANCEUR CLEAR 02/02/2018 2156   LABSPEC >1.046 (H) 02/02/2018 2156   PHURINE 7.0 02/02/2018 2156   GLUCOSEU NEGATIVE 02/02/2018 2156   HGBUR NEGATIVE 02/02/2018 2156   BILIRUBINUR NEGATIVE 02/02/2018 2156   BILIRUBINUR 1+ 01/21/2018 1019   KETONESUR 20 (A) 02/02/2018 2156   PROTEINUR 30 (A) 02/02/2018 2156   UROBILINOGEN 1.0 01/21/2018 1019   UROBILINOGEN 0.2  06/03/2014 0821   NITRITE NEGATIVE 02/02/2018 2156   LEUKOCYTESUR NEGATIVE 02/02/2018 2156    Microbiology Recent Results (from the past 240 hour(s))  Blood culture (routine x 2)     Status: None   Collection Time: 02/02/18  7:30 PM  Result Value Ref Range Status   Specimen Description BLOOD LEFT ARM  Final   Special Requests   Final    BOTTLES DRAWN AEROBIC AND ANAEROBIC Blood Culture adequate volume   Culture   Final    NO GROWTH 5 DAYS Performed at [facility], [place] [address] [place], [place] [address]    Report Status 02/07/2018 FINAL  Final  Blood culture (routine x 2)     Status: None   Collection Time: 02/02/18  7:33 PM  Result Value Ref Range Status   Specimen Description BLOOD RIGHT HAND  Final   Special Requests   Final    BOTTLES  DRAWN AEROBIC AND ANAEROBIC Blood Culture adequate volume   Culture   Final    NO GROWTH 5 DAYS Performed at [facility], [place] [address], [place], [place] [address]    Report Status 02/07/2018 FINAL  Final    Time coordinating discharge: Approximately 40 minutes  Patrecia Pour, MD  [facility] 02/09/2018, 3:44 PM Pager [phone]

## 2018-02-09 NOTE — Progress Notes (Signed)
Lianne Moris Haddix to be D/C'd Home per MD order.  Discussed with the patient and all questions fully answered.  VSS, Skin clean, dry and intact without evidence of skin break down, no evidence of skin tears noted. IV catheter discontinued intact. Site without signs and symptoms of complications. Dressing and pressure applied.  An After Visit Summary was printed and given to the patient. Patient received prescription.  D/c education completed with patient/family including follow up instructions, medication list, d/c activities limitations if indicated, with other d/c instructions as indicated by MD - patient able to verbalize understanding, all questions fully answered.   Patient instructed to return to ED, call 911, or call MD for any changes in condition.   Patient escorted via [place], and D/C home via private auto.  Home med returned to patient.   Betha Loa Shiraz Bastyr 02/09/2018 4:25 PM

## 2018-02-09 NOTE — Care Management Note (Signed)
Case Management Note  Patient Details  Name: Tony [name] MRN: [id number] Date of Birth: Mar 08, 1941  Subjective/Objective:  SOB, Gastric outlet obstruction,  hx  of fundectomy 12 days ago for perforated appendix, hypertension, prostate cancer / post radioactive seed implant but on oral chemotherapy. Lives with wife, [name].     [name] (Spouse) [name] (Daughter)    [phone]     PCP: Dr. [name]     Action/Plan: Transition to home.  Expected Discharge Date:  02/09/18               Expected Discharge Plan:  Home/Self Care  In-House Referral:   [facility]  Discharge planning Services  CM Consult  Post Acute Care Choice:    Choice offered to:  Patient  DME Arranged:  3-N-1[facility]ile DME Agency:  [place]:    [place]:     Status of Service:  Completed, signed off  If discussed at [place] of Stay Meetings, dates discussed:    Additional Comments:  Sharin Mons, RN 02/09/2018, 4:35 PM

## 2018-02-10 ENCOUNTER — Other Ambulatory Visit: Payer: Self-pay | Admitting: Family Medicine

## 2018-02-10 ENCOUNTER — Ambulatory Visit (HOSPITAL_COMMUNITY)
Admission: RE | Admit: 2018-02-10 | Discharge: 2018-02-10 | Disposition: A | Discharge status: Home / Self Care | Payer: Medicare Other | Source: Ambulatory Visit | Attending: Family Medicine | Admitting: Family Medicine

## 2018-02-10 ENCOUNTER — Ambulatory Visit (INDEPENDENT_AMBULATORY_CARE_PROVIDER_SITE_OTHER): Payer: Medicare Other | Admitting: Family Medicine

## 2018-02-10 ENCOUNTER — Encounter: Payer: Self-pay | Admitting: Family Medicine

## 2018-02-10 ENCOUNTER — Telehealth: Payer: Self-pay | Admitting: Family Medicine

## 2018-02-10 VITALS — BP 110/74 | HR 90 | Temp 98.7°F | Resp 16 | Ht 65.0 in | Wt 200.2 lb

## 2018-02-10 DIAGNOSIS — R0600 Dyspnea, unspecified: Secondary | ICD-10-CM | POA: Diagnosis not present

## 2018-02-10 DIAGNOSIS — M79602 Pain in left arm: Secondary | ICD-10-CM

## 2018-02-10 DIAGNOSIS — I712 Thoracic aortic aneurysm, without rupture: Secondary | ICD-10-CM | POA: Diagnosis not present

## 2018-02-10 DIAGNOSIS — R600 Localized edema: Secondary | ICD-10-CM

## 2018-02-10 DIAGNOSIS — M25522 Pain in left elbow: Secondary | ICD-10-CM | POA: Diagnosis not present

## 2018-02-10 DIAGNOSIS — I7121 Aneurysm of the ascending aorta, without rupture: Secondary | ICD-10-CM

## 2018-02-10 DIAGNOSIS — I82612 Acute embolism and thrombosis of superficial veins of left upper extremity: Secondary | ICD-10-CM | POA: Insufficient documentation

## 2018-02-10 NOTE — Telephone Encounter (Signed)
Call report: Upper arm venous- superficial vein thrombosis cephalic - elbow to wrist

## 2018-02-10 NOTE — Telephone Encounter (Signed)
Dr. [name] aware.

## 2018-02-10 NOTE — Telephone Encounter (Signed)
I printed this message and gave to [place].

## 2018-02-10 NOTE — Progress Notes (Signed)
ACUTE VISIT   HPI:  Chief Complaint  Patient presents with  . Arm Pain    left  . Edema    left    Tony [name] is a 77 y.o. male, who is here today with his wife complaining of severe left upper extremity pain that started a couple days ago. He recently underwent appendicectomy, complicated with abdominal hematoma, he was discharged from hospital yesterday. No history of injury. He was discharged on Tramadol,which helps some with pain.  He denies urinary symptoms or changes in bowel habits.  Pain is severe, exacerbated by movement and alleviated by rest and elevation. He has not noted numbness,tingling,weakness,or cyanosis.  Left elbow erythema. No Hx of fall or trauma. No Hx of gout.   + Exertional dyspnea. No chest pain,palpitations,cough,or wheezing. He has not noted changes in breathing but his wife has.   Lab Results  Component Value Date   WBC 9.5 02/07/2018   HGB 12.3 (L) 02/07/2018   HCT 36.8 (L) 02/07/2018   MCV 93.4 02/07/2018   PLT 241 02/07/2018   Negative for fever,chills,MS changes,or rash.  + Fatigue.  Hx of prostate cancer. He is on Prednisone 5 mg daily.   Chest CTA on 02/02/18 because dyspnea and tachypnea.  1. No acute findings in the chest.  Incidental note is made of aneurysmal disease of the thoracic aorta with maximal caliber of 4.7 cm at the level of the ascending thoracic aorta. Ascending thoracic aortic aneurysm.  Recommend semi-annual imaging followup by CTA or MRA and referral to cardiothoracic surgery if not already obtained. This recommendation follows 2010.    Review of Systems  Constitutional: Positive for activity change, appetite change and fatigue. Negative for fever.  HENT: Negative for nosebleeds, sore throat and trouble swallowing.   Eyes: Negative for redness and visual disturbance.  Respiratory: Positive for shortness of breath. Negative for cough and wheezing.   Cardiovascular: Positive for leg  swelling. Negative for chest pain and palpitations.  Gastrointestinal: Negative for abdominal pain, nausea and vomiting.  Genitourinary: Negative for decreased urine volume, dysuria and hematuria.  Musculoskeletal: Positive for gait problem. Negative for neck pain.  Skin: Negative for pallor and rash.  Neurological: Negative for syncope, speech difficulty, numbness and headaches.  Psychiatric/Behavioral: Negative for confusion. The patient is nervous/anxious.       Current Outpatient Medications on File Prior to Visit  Medication Sig Dispense Refill  . acetaminophen (TYLENOL) 325 MG tablet Take 2 tablets (650 mg total) by mouth every 6 (six) hours as needed for mild pain (or Fever >/= 101).    [name] amLODipine (NORVASC) 2.5 MG tablet Take 1 tablet (2.5 mg total) by mouth daily. 90 tablet 2  . aspirin EC 81 MG tablet Take 81 mg by mouth daily.    . fluticasone (FLONASE) 50 MCG/ACT nasal spray Place 1 spray into both nostrils daily as needed for allergies or rhinitis.    [name] LORazepam (ATIVAN) 1 MG tablet TAKE 1/2 TO 1 TABLET DAILY AS NEEDED (Patient taking differently: TAKE 1/2 TABLET BY MOUTH TWICE DAILY) 30 tablet 2  . magnesium oxide (MAG-OX) 400 (241.3 Mg) MG tablet Take 1 tablet (400 mg total) by mouth 2 (two) times daily. 60 tablet 0  . metoCLOPramide (REGLAN) 10 MG tablet Take 1 tablet (10 mg total) by mouth every 6 (six) hours as needed for nausea or vomiting. 15 tablet 0  . Multiple Vitamin (MULTIVITAMIN WITH MINERALS) TABS tablet Take 1 tablet by mouth daily.    [name]  pantoprazole (PROTONIX) 40 MG tablet Take 1 tablet (40 mg total) by mouth 2 (two) times daily. 60 tablet 0  . polyethylene glycol (MIRALAX / GLYCOLAX) packet Take 17 g by mouth daily as needed for severe constipation. 14 each 0  . potassium chloride SA (K-DUR,KLOR-CON) 20 MEQ tablet Take 1 tablet (20 mEq total) by mouth 2 (two) times daily. 60 tablet 0  . predniSONE (DELTASONE) 5 MG tablet Take 1 tablet (5 mg total) by mouth  daily with breakfast. 90 tablet 0  . psyllium (METAMUCIL SMOOTH TEXTURE) 28 % packet Take 1 packet by mouth 2 (two) times daily. (Patient taking differently: Take 1 packet by mouth 2 (two) times daily as needed (constipation). ) 60 tablet 11  . traMADol (ULTRAM) 50 MG tablet Take 1 tablet (50 mg total) by mouth every 6 (six) hours as needed (mild pain). 15 tablet 0  . traZODone (DESYREL) 50 MG tablet Take 0.5-1 tablets (25-50 mg total) by mouth at bedtime as needed for sleep. (Patient taking differently: Take 50 mg by mouth at bedtime. ) 90 tablet 2  . ZYTIGA 250 MG tablet TAKE 4 TABLETS (1000 MG) BY MOUTH DAILY. TAKE ON AN EMPTY STOMACH 1 HOUR BEFORE OR 2 HOURS AFTER A MEAL 120 tablet 0   No current facility-administered medications on file prior to visit.      Past Medical History:  Diagnosis Date  . Gastric outlet obstruction 02/03/2018   [name] 02/03/2018  . GERD (gastroesophageal reflux disease)   . History of hiatal hernia    small/notes 02/03/2018  . Hypertension    no meds  . Prostate cancer ([place]) 02/08/14   Gleason 4+5=9, PSA 15.65  . Radiation   . Sinus problem    Allergies  Allergen Reactions  . Penicillins Rash    Has patient had a PCN reaction causing immediate rash, facial/tongue/throat swelling, SOB or lightheadedness with hypotension: Yes Has patient had a PCN reaction causing severe rash involving mucus membranes or skin necrosis: No Has patient had a PCN reaction that required hospitalization: No Has patient had a PCN reaction occurring within the last 10 years: No If all of the above answers are "NO", then may proceed with Cephalosporin use.    Social History   Socioeconomic History  . Marital status: Married    Spouse name: Not on file  . Number of children: Not on file  . Years of education: Not on file  . Highest education level: Not on file  Occupational History  . Not on file  Social Needs  . Financial resource strain: Not on file  . Food insecurity:     Worry: Not on file    Inability: Not on file  . Transportation needs:    Medical: Not on file    Non-medical: Not on file  Tobacco Use  . Smoking status: Former Smoker    Years: 60.00    Types: Cigars    Last attempt to quit: 09/14/2012    Years since quitting: 5.4  . Smokeless tobacco: Never Used  . Tobacco comment: little cigars; the small ones"  Substance and Sexual Activity  . Alcohol use: Yes    Alcohol/week: 8.4 oz    Types: 14 Cans of beer per week  . Drug use: No  . Sexual activity: Not Currently  Lifestyle  . Physical activity:    Days per week: Not on file    Minutes per session: Not on file  . Stress: Not on file  Relationships  .  Social connections:    Talks on phone: Not on file    Gets together: Not on file    Attends religious service: Not on file    Active member of club or organization: Not on file    Attends meetings of clubs or organizations: Not on file    Relationship status: Not on file  Other Topics Concern  . Not on file  Social History Narrative  . Not on file    Vitals:   02/10/18 0830  BP: 110/74  Pulse: 90  Resp: 16  Temp: 98.7 F (37.1 C)  SpO2: 97%   Body mass index is 33.32 kg/m.   Physical Exam  Nursing note and vitals reviewed. Constitutional: He is oriented to person, place, and time. He appears well-developed. No distress.  HENT:  Head: Normocephalic and atraumatic.  Mouth/Throat: Oropharynx is clear and moist and mucous membranes are normal.  Eyes: Pupils are equal, round, and reactive to light. Conjunctivae and EOM are normal.  Cardiovascular: Normal rate. An irregular rhythm present.  Murmur (Soft SEM RUSB) heard. Pulses:      Dorsalis pedis pulses are 2+ on the right side, and 2+ on the left side.  Respiratory: Effort normal and breath sounds normal. No respiratory distress.  GI: Soft. He exhibits no mass. There is no tenderness.  Musculoskeletal: He exhibits edema (1-2+ pitting LE edema,bilateral.).       Left  elbow: He exhibits decreased range of motion and swelling. He exhibits no deformity. Tenderness found. Olecranon process tenderness noted.  Mild erythema and local heat on elbow,no induration,pain with palpation and ROM. Mild limitation of flexion and extension.   Lymphadenopathy:    He has no cervical adenopathy.  Neurological: He is alert and oriented to person, place, and time. He has normal strength. Gait abnormal.  Unstable gait assisted with a cane.  Skin: Skin is warm. No erythema.  Psychiatric: He has a normal mood and affect. Cognition and memory are normal.  Well groomed, good eye contact.    ASSESSMENT AND PLAN:   Tony [name] was seen today for arm pain and edema.  Diagnoses and all orders for this visit:   Edema of upper extremity  Possible causes discussed. Elevation of LUE. LUE vascular US ordered stat.  -     US Venous Img Upper Uni Left; Future -     CBC with Differential/Platelet  Dyspnea, unspecified type  He is reporting no changes in dyspnea. Recent chest CTA negative for acute process. O2 sat in normal range. EKG today SR,RBBB,LAFB, a few PAC's,unspe T wave abn. No significant chnages when compared with EKG 02/02/18. Instructed about warning sogns.   -     EKG 12-Lead -     Basic metabolic panel -     CBC with Differential/Platelet  Elbow pain, left  ? Gout attack. No Hx of recent trauma,so for now I do not think imaging is needed.  Further recommendations will be given according to labresults.  -     Uric acid -     CBC with Differential/Platelet  Thoracic ascending aortic aneurysm [facility])  Incidental finding on chest CT,asymptomatic.  -     Ambulatory referral to Cardiothoracic Surgery      Burk Hoctor G. [name], MD  [facility]. [place] office.

## 2018-02-18 ENCOUNTER — Ambulatory Visit (INDEPENDENT_AMBULATORY_CARE_PROVIDER_SITE_OTHER): Payer: Medicare Other | Admitting: Family Medicine

## 2018-02-18 ENCOUNTER — Inpatient Hospital Stay: Payer: Medicare Other | Admitting: Family Medicine

## 2018-02-18 ENCOUNTER — Encounter: Payer: Self-pay | Admitting: Family Medicine

## 2018-02-18 DIAGNOSIS — I82890 Acute embolism and thrombosis of other specified veins: Secondary | ICD-10-CM | POA: Diagnosis not present

## 2018-02-18 DIAGNOSIS — E876 Hypokalemia: Secondary | ICD-10-CM | POA: Diagnosis not present

## 2018-02-18 DIAGNOSIS — R2681 Unsteadiness on feet: Secondary | ICD-10-CM

## 2018-02-18 DIAGNOSIS — I10 Essential (primary) hypertension: Secondary | ICD-10-CM

## 2018-02-18 LAB — CBC
HCT: 43.8 % (ref 39.0–52.0)
Hemoglobin: 15 g/dL (ref 13.0–17.0)
MCHC: 34.4 g/dL (ref 30.0–36.0)
MCV: 94.2 fl (ref 78.0–100.0)
Platelets: 297 K/uL (ref 150.0–400.0)
RBC: 4.65 Mil/uL (ref 4.22–5.81)
RDW: 13.4 % (ref 11.5–15.5)
WBC: 5.8 K/uL (ref 4.0–10.5)

## 2018-02-18 LAB — BASIC METABOLIC PANEL WITH GFR
BUN: 18 mg/dL (ref 6–23)
CO2: 23 meq/L (ref 19–32)
Calcium: 9.2 mg/dL (ref 8.4–10.5)
Chloride: 100 meq/L (ref 96–112)
Creatinine, Ser: 0.99 mg/dL (ref 0.40–1.50)
GFR: 78.21 mL/min
Glucose, Bld: 118 mg/dL — ABNORMAL HIGH (ref 70–99)
Potassium: 4.5 meq/L (ref 3.5–5.1)
Sodium: 137 meq/L (ref 135–145)

## 2018-02-18 LAB — MAGNESIUM: Magnesium: 2.2 mg/dL (ref 1.5–2.5)

## 2018-02-18 NOTE — Progress Notes (Signed)
HPI:   [name] is a 77 y.o. male, who is here today to follow on recent hospitalization. I saw him recently, 02/10/18,when he was c/o LUE pain and edema. Symptoms have resolved.  UE venous Dupplex [place] 02/10/18: Right:No evidence of thrombosis in the subclavian Left:Patent IJV and subclavian vein. Evidence of acute superficial venous thrombosis in the cephalic vein from the left wrist to the anticubital fossa. All other deep and superficial veins are normal.  Initially admitted for acute appendicitis on 01/24/18, underwent laparoscopic appendectomy. Discharged on 01/24/18 on Cipro and Metronidazole.  Re-admitted on 02/02/18 because SOB,nausea,and vomiting. He underwent EGD: Reflux and erosive esophagitis,gastritis,and duodenitis. Symptoms resolved with Reglan,diety was slowly advance.He is still in liquid diet. His wife is asking if he can eat regular diet. On Protonix 40 mg bid.  HypoK+: On K-DUR 40 meq daily. K+ 3.1 on 5/29.  Mg low at 1.6. On Mg Oxide 400 mg bid.  Appt with surgeon 02/23/18.  HTN: No changes in Amlodipine 2.5 mg daily. Appetite is good.  He is walking daily for a few minutes. Unstable gait, his wife is asking if he can have a motorized scooter. He has a cane and a walker at home but cannot walk long distances due to LE weakness.    Review of Systems  Constitutional: Positive for fatigue. Negative for activity change, appetite change and fever.  HENT: Negative for nosebleeds, sore throat and trouble swallowing.   Eyes: Negative for redness and visual disturbance.  Respiratory: Negative for cough, shortness of breath and wheezing.   Cardiovascular: Negative for chest pain, palpitations and leg swelling.  Gastrointestinal: Negative for abdominal pain, nausea and vomiting.  Genitourinary: Negative for decreased urine volume, dysuria and hematuria.  Musculoskeletal: Positive for gait problem.  Skin: Negative for rash and wound.    Neurological: Negative for dizziness, syncope, weakness and headaches.  Psychiatric/Behavioral: Negative for confusion. The patient is nervous/anxious.       Current Outpatient Medications on File Prior to Visit  Medication Sig Dispense Refill  . acetaminophen (TYLENOL) 325 MG tablet Take 2 tablets (650 mg total) by mouth every 6 (six) hours as needed for mild pain (or Fever >/= 101).    [name] amLODipine (NORVASC) 2.5 MG tablet Take 1 tablet (2.5 mg total) by mouth daily. 90 tablet 2  . aspirin EC 81 MG tablet Take 81 mg by mouth daily.    . fluticasone (FLONASE) 50 MCG/ACT nasal spray Place 1 spray into both nostrils daily as needed for allergies or rhinitis.    [name] LORazepam (ATIVAN) 1 MG tablet TAKE 1/2 TO 1 TABLET DAILY AS NEEDED (Patient taking differently: TAKE 1/2 TABLET BY MOUTH TWICE DAILY) 30 tablet 2  . magnesium oxide (MAG-OX) 400 (241.3 Mg) MG tablet Take 1 tablet (400 mg total) by mouth 2 (two) times daily. 60 tablet 0  . metoCLOPramide (REGLAN) 10 MG tablet Take 1 tablet (10 mg total) by mouth every 6 (six) hours as needed for nausea or vomiting. 15 tablet 0  . Multiple Vitamin (MULTIVITAMIN WITH MINERALS) TABS tablet Take 1 tablet by mouth daily.    . pantoprazole (PROTONIX) 40 MG tablet Take 1 tablet (40 mg total) by mouth 2 (two) times daily. 60 tablet 0  . polyethylene glycol (MIRALAX / GLYCOLAX) packet Take 17 g by mouth daily as needed for severe constipation. 14 each 0  . potassium chloride SA (K-DUR,KLOR-CON) 20 MEQ tablet Take 1 tablet (20 mEq total) by mouth 2 (two)  times daily. 60 tablet 0  . predniSONE (DELTASONE) 5 MG tablet Take 1 tablet (5 mg total) by mouth daily with breakfast. 90 tablet 0  . psyllium (METAMUCIL SMOOTH TEXTURE) 28 % packet Take 1 packet by mouth 2 (two) times daily. (Patient taking differently: Take 1 packet by mouth 2 (two) times daily as needed (constipation). ) 60 tablet 11  . traMADol (ULTRAM) 50 MG tablet Take 1 tablet (50 mg total) by mouth  every 6 (six) hours as needed (mild pain). 15 tablet 0  . traZODone (DESYREL) 50 MG tablet Take 0.5-1 tablets (25-50 mg total) by mouth at bedtime as needed for sleep. (Patient taking differently: Take 50 mg by mouth at bedtime. ) 90 tablet 2  . ZYTIGA 250 MG tablet TAKE 4 TABLETS (1000 MG) BY MOUTH DAILY. TAKE ON AN EMPTY STOMACH 1 HOUR BEFORE OR 2 HOURS AFTER A MEAL 120 tablet 0   No current facility-administered medications on file prior to visit.      Past Medical History:  Diagnosis Date  . Gastric outlet obstruction 02/03/2018   [name] 02/03/2018  . GERD (gastroesophageal reflux disease)   . History of hiatal hernia    small/notes 02/03/2018  . Hypertension    no meds  . Prostate cancer ([place]) 02/08/14   Gleason 4+5=9, PSA 15.65  . Radiation   . Sinus problem    Allergies  Allergen Reactions  . Penicillins Rash    Has patient had a PCN reaction causing immediate rash, facial/tongue/throat swelling, SOB or lightheadedness with hypotension: Yes Has patient had a PCN reaction causing severe rash involving mucus membranes or skin necrosis: No Has patient had a PCN reaction that required hospitalization: No Has patient had a PCN reaction occurring within the last 10 years: No If all of the above answers are "NO", then may proceed with Cephalosporin use.    Social History   Socioeconomic History  . Marital status: Married    Spouse name: Not on file  . Number of children: Not on file  . Years of education: Not on file  . Highest education level: Not on file  Occupational History  . Not on file  Social Needs  . Financial resource strain: Not on file  . Food insecurity:    Worry: Not on file    Inability: Not on file  . Transportation needs:    Medical: Not on file    Non-medical: Not on file  Tobacco Use  . Smoking status: Former Smoker    Years: 60.00    Types: Cigars    Last attempt to quit: 09/14/2012    Years since quitting: 5.4  . Smokeless tobacco: Never Used   . Tobacco comment: little cigars; the small ones"  Substance and Sexual Activity  . Alcohol use: Yes    Alcohol/week: 8.4 oz    Types: 14 Cans of beer per week  . Drug use: No  . Sexual activity: Not Currently  Lifestyle  . Physical activity:    Days per week: Not on file    Minutes per session: Not on file  . Stress: Not on file  Relationships  . Social connections:    Talks on phone: Not on file    Gets together: Not on file    Attends religious service: Not on file    Active member of club or organization: Not on file    Attends meetings of clubs or organizations: Not on file    Relationship status: Not on file  Other Topics Concern  . Not on file  Social History Narrative  . Not on file    Vitals:   02/18/18 0756  BP: 120/80  Pulse: 68  Resp: 18  Temp: 97.6 F (36.4 C)  SpO2: 97%   Body mass index is 31.48 kg/m.    Physical Exam  Nursing note and vitals reviewed. Constitutional: He is oriented to person, place, and time. He appears well-developed. No distress.  HENT:  Head: Normocephalic and atraumatic.  Mouth/Throat: Oropharynx is clear and moist and mucous membranes are normal.  Eyes: Pupils are equal, round, and reactive to light. Conjunctivae are normal.  Cardiovascular: Normal rate and regular rhythm.  No murmur heard. Pulses:      Dorsalis pedis pulses are 2+ on the right side, and 2+ on the left side.  Respiratory: Effort normal and breath sounds normal. No respiratory distress.  GI: Soft. He exhibits no mass. There is no tenderness.  Musculoskeletal: He exhibits edema (Trace pitting LE , bilateral.).  Lymphadenopathy:    He has no cervical adenopathy.  Neurological: He is alert and oriented to person, place, and time. He has normal strength.  Unstable gait assisted by a cane.  Skin: Skin is warm. No rash noted. No erythema.  Psychiatric: He has a normal mood and affect. Cognition and memory are normal.  Well groomed, good eye contact.     ASSESSMENT AND PLAN:  Mr. Fedor was seen today for hospitalization follow-up.  Diagnoses and all orders for this visit:  Lab Results  Component Value Date   CREATININE 0.99 02/18/2018   BUN 18 02/18/2018   NA 137 02/18/2018   K 4.5 02/18/2018   CL 100 02/18/2018   CO2 23 02/18/2018   Lab Results  Component Value Date   WBC 5.8 02/18/2018   HGB 15.0 02/18/2018   HCT 43.8 02/18/2018   MCV 94.2 02/18/2018   PLT 297.0 02/18/2018     Hypomagnesemia  Continue Mg Oxide 400 mg bid. Further recommendations will be given according to labs results.  -     Magnesium  Unstable gait  Fall precautions discussed. He is not interested in PT. Rx for motorized scooter given.  -     DME Other see comment   Superficial vein thrombosis  Symptoms resolved. Instructed about warning signs. F/U as needed.  -     CBC  Hypokalemia  No changes in current management, will follow labs done today and will give further recommendations accordingly.  -     Basic metabolic panel   Essential hypertension, benign Adequately controlled. No changes in current management. Low salt diet to continue. Eye exam recommended annually. F/U in 4 months, before if needed.       [name]. [name], MD  [facility]. [place] office.

## 2018-02-18 NOTE — Patient Instructions (Addendum)
A few things to remember from today's visit:   Hypomagnesemia - Plan: Magnesium, Basic metabolic panel, CBC  Unstable gait - Plan: DME Other see comment  Essential hypertension, benign  Superficial vein thrombosis  Advance diet as tolerated. Keep appt with surgeon.  No changes in medications.  Fall precaution.  Please be sure medication list is accurate. If a new problem present, please set up appointment sooner than planned today.

## 2018-02-18 NOTE — Assessment & Plan Note (Signed)
Adequately controlled. No changes in current management. Low-salt diet to continue. Eye exam recommended annually. F/U in 4 months, before if needed.  

## 2018-02-22 ENCOUNTER — Other Ambulatory Visit: Payer: Self-pay | Admitting: Oncology

## 2018-03-02 ENCOUNTER — Inpatient Hospital Stay: Payer: Medicare Other | Attending: Oncology

## 2018-03-02 ENCOUNTER — Encounter: Payer: Medicare Other | Admitting: Surgery

## 2018-03-02 ENCOUNTER — Other Ambulatory Visit: Payer: Self-pay | Admitting: Oncology

## 2018-03-02 DIAGNOSIS — D751 Secondary polycythemia: Secondary | ICD-10-CM | POA: Diagnosis not present

## 2018-03-02 DIAGNOSIS — Z5111 Encounter for antineoplastic chemotherapy: Secondary | ICD-10-CM | POA: Diagnosis not present

## 2018-03-02 DIAGNOSIS — C61 Malignant neoplasm of prostate: Secondary | ICD-10-CM | POA: Diagnosis not present

## 2018-03-02 DIAGNOSIS — E876 Hypokalemia: Secondary | ICD-10-CM | POA: Insufficient documentation

## 2018-03-02 DIAGNOSIS — I10 Essential (primary) hypertension: Secondary | ICD-10-CM | POA: Diagnosis not present

## 2018-03-02 LAB — CBC WITH DIFFERENTIAL (CANCER CENTER ONLY)
BASOS ABS: 0.1 10*3/uL (ref 0.0–0.1)
Basophils Relative: 1 %
EOS ABS: 0.1 10*3/uL (ref 0.0–0.5)
EOS PCT: 2 %
HCT: 42.9 % (ref 38.4–49.9)
HEMOGLOBIN: 14.2 g/dL (ref 13.0–17.1)
LYMPHS ABS: 0.8 10*3/uL — AB (ref 0.9–3.3)
LYMPHS PCT: 14 %
MCH: 31.4 pg (ref 27.2–33.4)
MCHC: 33 g/dL (ref 32.0–36.0)
MCV: 95.1 fL (ref 79.3–98.0)
Monocytes Absolute: 0.4 10*3/uL (ref 0.1–0.9)
Monocytes Relative: 7 %
NEUTROS PCT: 76 %
Neutro Abs: 4.4 10*3/uL (ref 1.5–6.5)
PLATELETS: 153 10*3/uL (ref 140–400)
RBC: 4.51 MIL/uL (ref 4.20–5.82)
RDW: 14 % (ref 11.0–14.6)
WBC: 5.8 10*3/uL (ref 4.0–10.3)

## 2018-03-02 LAB — CMP (CANCER CENTER ONLY)
ALK PHOS: 68 U/L (ref 40–150)
ALT: 11 U/L (ref 0–55)
AST: 13 U/L (ref 5–34)
Albumin: 3.7 g/dL (ref 3.5–5.0)
Anion gap: 10 (ref 3–11)
BUN: 19 mg/dL (ref 7–26)
CALCIUM: 9.1 mg/dL (ref 8.4–10.4)
CHLORIDE: 104 mmol/L (ref 98–109)
CO2: 25 mmol/L (ref 22–29)
CREATININE: 0.93 mg/dL (ref 0.70–1.30)
GFR, Est AFR Am: >60 mL/min (ref >60)
GFR, Estimated: >60 mL/min (ref >60)
GLUCOSE: 123 mg/dL (ref 70–140)
Potassium: 4.1 mmol/L (ref 3.5–5.1)
SODIUM: 139 mmol/L (ref 136–145)
Total Bilirubin: 0.4 mg/dL (ref 0.2–1.2)
Total Protein: 6.6 g/dL (ref 6.4–8.3)

## 2018-03-03 LAB — PROSTATE-SPECIFIC AG, SERUM (LABCORP)

## 2018-03-04 ENCOUNTER — Telehealth: Payer: Self-pay

## 2018-03-04 ENCOUNTER — Other Ambulatory Visit: Payer: Self-pay | Admitting: Pharmacist

## 2018-03-04 ENCOUNTER — Inpatient Hospital Stay: Payer: Medicare Other | Admitting: Oncology

## 2018-03-04 ENCOUNTER — Inpatient Hospital Stay: Payer: Medicare Other

## 2018-03-04 VITALS — BP 151/99 | HR 57 | Temp 97.4°F | Resp 14 | Ht 65.0 in | Wt 197.1 lb

## 2018-03-04 DIAGNOSIS — I10 Essential (primary) hypertension: Secondary | ICD-10-CM

## 2018-03-04 DIAGNOSIS — D751 Secondary polycythemia: Secondary | ICD-10-CM

## 2018-03-04 DIAGNOSIS — C61 Malignant neoplasm of prostate: Secondary | ICD-10-CM

## 2018-03-04 DIAGNOSIS — E876 Hypokalemia: Secondary | ICD-10-CM

## 2018-03-04 DIAGNOSIS — Z5111 Encounter for antineoplastic chemotherapy: Secondary | ICD-10-CM | POA: Diagnosis not present

## 2018-03-04 MED ORDER — LEUPROLIDE ACETATE (3 MONTH) 22.5 MG IM KIT
22.5000 mg | PACK | Freq: Once | INTRAMUSCULAR | Status: AC
  Administered 2018-03-04: 22.5 mg via INTRAMUSCULAR

## 2018-03-04 MED FILL — ZYTIGA 250 MG TABLET: 250 | 30 days supply | Qty: 120 | Fill #0

## 2018-03-04 NOTE — Patient Instructions (Signed)
Leuprolide depot injection What is this medicine? LEUPROLIDE (loo PROE lide) is a man-made protein that acts like a natural hormone in the body. It decreases testosterone in men and decreases estrogen in women. In men, this medicine is used to treat advanced prostate cancer. In women, some forms of this medicine may be used to treat endometriosis, uterine fibroids, or other male hormone-related problems. This medicine may be used for other purposes; ask your health care provider or pharmacist if you have questions. COMMON BRAND NAME(S): Eligard, Lupron Depot, Lupron Depot-Ped, Viadur What should I tell my health care provider before I take this medicine? They need to know if you have any of these conditions: -diabetes -heart disease or previous heart attack -high blood pressure -high cholesterol -mental illness -osteoporosis -pain or difficulty passing urine -seizures -spinal cord metastasis -stroke -suicidal thoughts, plans, or attempt; a previous suicide attempt by you or a family member -tobacco smoker -unusual vaginal bleeding (women) -an unusual or allergic reaction to leuprolide, benzyl alcohol, other medicines, foods, dyes, or preservatives -pregnant or trying to get pregnant -breast-feeding How should I use this medicine? This medicine is for injection into a muscle or for injection under the skin. It is given by a health care professional in a hospital or clinic setting. The specific product will determine how it will be given to you. Make sure you understand which product you receive and how often you will receive it. Talk to your pediatrician regarding the use of this medicine in children. Special care may be needed. Overdosage: If you think you have taken too much of this medicine contact a poison control center or emergency room at once. NOTE: This medicine is only for you. Do not share this medicine with others. What if I miss a dose? It is important not to miss a dose.  Call your doctor or health care professional if you are unable to keep an appointment. Depot injections: Depot injections are given either once-monthly, every 12 weeks, every 16 weeks, or every 24 weeks depending on the product you are prescribed. The product you are prescribed will be based on if you are male or male, and your condition. Make sure you understand your product and dosing. What may interact with this medicine? Do not take this medicine with any of the following medications: -chasteberry This medicine may also interact with the following medications: -herbal or dietary supplements, like black cohosh or DHEA -male hormones, like estrogens or progestins and birth control pills, patches, rings, or injections -male hormones, like testosterone This list may not describe all possible interactions. Give your health care provider a list of all the medicines, herbs, non-prescription drugs, or dietary supplements you use. Also tell them if you smoke, drink alcohol, or use illegal drugs. Some items may interact with your medicine. What should I watch for while using this medicine? Visit your doctor or health care professional for regular checks on your progress. During the first weeks of treatment, your symptoms may get worse, but then will improve as you continue your treatment. You may get hot flashes, increased bone pain, increased difficulty passing urine, or an aggravation of nerve symptoms. Discuss these effects with your doctor or health care professional, some of them may improve with continued use of this medicine. Male patients may experience a menstrual cycle or spotting during the first months of therapy with this medicine. If this continues, contact your doctor or health care professional. What side effects may I notice from receiving this medicine? Side   effects that you should report to your doctor or health care professional as soon as possible: -allergic reactions like skin  rash, itching or hives, swelling of the face, lips, or tongue -breathing problems -chest pain -depression or memory disorders -pain in your legs or groin -pain at site where injected or implanted -seizures -severe headache -swelling of the feet and legs -suicidal thoughts or other mood changes -visual changes -vomiting Side effects that usually do not require medical attention (report to your doctor or health care professional if they continue or are bothersome): -breast swelling or tenderness -decrease in sex drive or performance -diarrhea -hot flashes -loss of appetite -muscle, joint, or bone pains -nausea -redness or irritation at site where injected or implanted -skin problems or acne This list may not describe all possible side effects. Call your doctor for medical advice about side effects. You may report side effects to FDA at 1-800-FDA-1088. Where should I keep my medicine? This drug is given in a hospital or clinic and will not be stored at home. NOTE: This sheet is a summary. It may not cover all possible information. If you have questions about this medicine, talk to your doctor, pharmacist, or health care provider.  2018 Elsevier/Gold Standard (2016-02-13 09:45:53)  

## 2018-03-04 NOTE — Progress Notes (Signed)
Hematology and Oncology Follow Up Visit  Tony [name] [id number] [birth year] 77 y.o. 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 He was diagnosed with Gleason score is 4+5 = 9, PSA was 15.7 in 2015.    Prior Therapy:  He is status post radiation therapy for external beam radiation completed in September 2015 for a total of 45 gray. He also status post seed implant brachytherapy boost completed in October 2015.   He received total of 1 year of androgen deprivation 22,015 and 2016. His PSA nadir was to 0.47 in November 2016. His PSA was 4.08 in May 2017.  Current therapy:  Androgen deprivation in the form of Lupron 22.5 mg resumed on 03/02/2016. This will be given every 3 months.  Zytiga 1000 mg with prednisone started around August 24 of 2017.   Interim History: Tony [name] presents today for a follow-up.  Since the last visit, he was hospitalized on 2 separate occasions in May 2019.  He had an acute appendicitis perforated appendix and underwent an appendectomy.  He subsequently presented with shortness of breath and anemia and found to have esophagitis and duodenitis that has resolved subsequently.  Since that this episode, he has recovered reasonably well and have regained all activities of daily living.  He does not report any abdominal discomfort or change in his bowel habits.  He has lost weight and is gaining it back.  He denies any complications related to Zytiga.  He denies any lower extremity swelling or excessive fatigue.  He did miss one day of this medication.  He does not report any headaches blurred vision or double vision.  He denies any alteration in mental status or confusion.  He does not report any fevers, chills or sweats. He does not report any chest pain shortness of breath cough or hemoptysis. Does not report any palpitation orthopnea or PND.  He does  not report any nausea, vomiting.  He denies any hematochezia or melena.  He does not report any bone pain or pathological fractures.  He does not report any skin rashes or lymphadenopathy.  He does not report any petechiae or ecchymosis.  He denies any bleeding or clotting tendencies.  Rest of his review of systems is negative.  Medications: I have reviewed the patient's current medications.  Current Outpatient Medications  Medication Sig Dispense Refill  . acetaminophen (TYLENOL) 325 MG tablet Take 2 tablets (650 mg total) by mouth every 6 (six) hours as needed for mild pain (or Fever >/= 101).    [name] amLODipine (NORVASC) 2.5 MG tablet Take 1 tablet (2.5 mg total) by mouth daily. 90 tablet 2  . aspirin EC 81 MG tablet Take 81 mg by mouth daily.    . fluticasone (FLONASE) 50 MCG/ACT nasal spray Place 1 spray into both nostrils daily as needed for allergies or rhinitis.    [name] LORazepam (ATIVAN) 1 MG tablet TAKE 1/2 TO 1 TABLET DAILY AS NEEDED (Patient taking differently: TAKE 1/2 TABLET BY MOUTH TWICE DAILY) 30 tablet 2  . magnesium oxide (MAG-OX) 400 (241.3 Mg) MG tablet Take 1 tablet (400 mg total) by mouth 2 (two) times daily. 60 tablet 0  . metoCLOPramide (REGLAN) 10 MG tablet Take 1 tablet (10 mg total) by mouth every 6 (six) hours as needed for nausea or vomiting. 15 tablet 0  . Multiple Vitamin (MULTIVITAMIN WITH MINERALS) TABS tablet Take 1 tablet by mouth daily.    [name]  pantoprazole (PROTONIX) 40 MG tablet Take 1 tablet (40 mg total) by mouth 2 (two) times daily. 60 tablet 0  . polyethylene glycol (MIRALAX / GLYCOLAX) packet Take 17 [name] by mouth daily as needed for severe constipation. 14 each 0  . potassium chloride SA (K-DUR,KLOR-CON) 20 MEQ tablet Take 1 tablet (20 mEq total) by mouth 2 (two) times daily. 60 tablet 0  . predniSONE (DELTASONE) 5 MG tablet TAKE 1 TABLET BY MOUTH EVERY DAY WITH BREAKFAST 90 tablet 0  . psyllium (METAMUCIL SMOOTH TEXTURE) 28 % packet Take 1 packet by mouth 2 (two)  times daily. (Patient taking differently: Take 1 packet by mouth 2 (two) times daily as needed (constipation). ) 60 tablet 11  . traMADol (ULTRAM) 50 MG tablet Take 1 tablet (50 mg total) by mouth every 6 (six) hours as needed (mild pain). 15 tablet 0  . traZODone (DESYREL) 50 MG tablet Take 0.5-1 tablets (25-50 mg total) by mouth at bedtime as needed for sleep. (Patient taking differently: Take 50 mg by mouth at bedtime. ) 90 tablet 2  . ZYTIGA 250 MG tablet TAKE 4 TABLETS (1000 MG) BY MOUTH DAILY. TAKE ON AN EMPTY STOMACH 1 HOUR BEFORE OR 2 HOURS AFTER A MEAL 120 tablet 0   No current facility-administered medications for this visit.      Allergies:  Allergies  Allergen Reactions  . Penicillins Rash    Has patient had a PCN reaction causing immediate rash, facial/tongue/throat swelling, SOB or lightheadedness with hypotension: Yes Has patient had a PCN reaction causing severe rash involving mucus membranes or skin necrosis: No Has patient had a PCN reaction that required hospitalization: No Has patient had a PCN reaction occurring within the last 10 years: No If all of the above answers are "NO", then may proceed with Cephalosporin use.    Past Medical History, Surgical history, Social history, and Family History were reviewed and updated.   Physical Exam: Blood pressure (!) 151/99, pulse (!) 57, temperature (!) 97.4 F (36.3 C), temperature source Oral, resp. rate 14, height 5\' 5"  (1.651 m), weight 197 lb 1.6 oz (89.4 kg), SpO2 96 %.    ECOG: 1 General appearance: Well-appearing gentleman without distress. Head: No cephalic without abnormalities. Oropharynx: No oral ulcers or thrush. Eyes: Sclera anicteric.  Extraocular muscle intact. Heart:regular rate and rhythm, without any murmurs.  No leg edema.   Lung: Clear in all lung fields without any rhonchi, wheezes or dullness to percussion.   Abdomin: Soft, without any rebound or guarding.  No shifting dullness or  ascites. Musculoskeletal: No joint deformity or effusion. Skin: No ecchymosis or petechiae Neurological: No motor or sensory deficits.   Lab Results: Lab Results  Component Value Date   WBC 5.8 03/02/2018   HGB 14.2 03/02/2018   HCT 42.9 03/02/2018   MCV 95.1 03/02/2018   PLT 153 03/02/2018     Chemistry      Component Value Date/Time   NA 139 03/02/2018 0800   NA 141 08/27/2017 0915   K 4.1 03/02/2018 0800   K 4.8 08/27/2017 0915   CL 104 03/02/2018 0800   CO2 25 03/02/2018 0800   CO2 26 08/27/2017 0915   BUN 19 03/02/2018 0800   BUN 16.2 08/27/2017 0915   CREATININE 0.93 03/02/2018 0800   CREATININE 0.9 08/27/2017 0915   GLU 105 01/10/2016      Component Value Date/Time   CALCIUM 9.1 03/02/2018 0800   CALCIUM 9.8 08/27/2017 0915   ALKPHOS 68 03/02/2018  0800   ALKPHOS 83 08/27/2017 0915   AST 13 03/02/2018 0800   AST 23 08/27/2017 0915   ALT 11 03/02/2018 0800   ALT 17 08/27/2017 0915   BILITOT 0.4 03/02/2018 0800   BILITOT 0.95 08/27/2017 0915      Results for ANTAVION, BARTOSZEK (MRN [id number]) as of 03/04/2018 09:38  Ref. Range 08/27/2017 09:15 11/30/2017 07:47 03/02/2018 08:00  Prostate Specific Ag, Serum Latest Ref Range: 0.0 - 4.0 ng/mL <0.1 <0.1 <0.1     Impression and Plan:   77 year old gentleman with the following issues:  1.  Castration-resistant prostate cancer developed in 2017 with a rising PSA and a biochemical relapse.  Imaging studies showed no detectable disease.  His PSA continues to be undetectable and reports no complications related to Zytiga.  Risks and benefits of continuing this medication was reviewed today is agreeable to continue.  2. Androgen depravation: He is on Lupron at 22.5 mg every 3 months.  Risks and benefits of continuing this treatment was discussed today and is agreeable to continue.  He will receive it today and repeat in 3 months.  3. 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  His hemoglobin  continues to be in normal range.  4. Hypokalemia: His potassium on 03/02/2018 was within normal range.  This is related to [facility] and we will continue to monitor potassium regularly.  5. Hypertension: Blood pressure mildly elevated today which could be related to Zytiga.  I asked him to continue to monitor his blood pressure between visits.  He has follow-up with cardiology regarding this issues.  6.  Prognosis and goals of care: Treatment remains palliative at this time but his disease has been palliated adequately at this time.  7. Follow-up: Will be in 3 months.  15  minutes was spent with the patient face-to-face today.  More than 50% of time was dedicated to patient counseling, education and coordinating his care.Zola Button, MD 6/21/20199:36 AM

## 2018-03-04 NOTE — Telephone Encounter (Signed)
Printed avs and calender of upcoming appointment per 6/21 los.

## 2018-03-05 ENCOUNTER — Other Ambulatory Visit: Payer: Self-pay | Admitting: Family Medicine

## 2018-03-05 DIAGNOSIS — F418 Other specified anxiety disorders: Secondary | ICD-10-CM

## 2018-03-07 ENCOUNTER — Other Ambulatory Visit: Payer: Self-pay | Admitting: Family Medicine

## 2018-03-07 DIAGNOSIS — F418 Other specified anxiety disorders: Secondary | ICD-10-CM

## 2018-03-07 NOTE — Telephone Encounter (Signed)
Copied from [place] [id number]. Topic: Quick Communication - Rx Refill/Question >> Mar 07, 2018  5:10 PM Mcneil, Ja-Kwan wrote: Medication: LORazepam (ATIVAN) 1 MG tablet  Has the patient contacted their pharmacy? yes   Preferred Pharmacy (with phone number or street name): [id number] - [place], [place] [phone] (Phone) [phone] (Fax)   Agent: Please be advised that RX refills may take up to 3 business days. We ask that you follow-up with your pharmacy.

## 2018-03-08 NOTE — Telephone Encounter (Signed)
Refill of Ativan  LOV 02/18/18  Dr. [name]  LRF 11/09/27  #30  2 refills  [id number] - [place], [place] - [address]  [phone] (Phone) [phone] (Fax)

## 2018-03-08 NOTE — Telephone Encounter (Signed)
Last filled 11/08/17 for 3 months. Last seen 02/18/18. Scheduled for follow up on 05/23/18. Please advise.  Thanks!!

## 2018-03-16 ENCOUNTER — Encounter: Payer: Medicare Other | Admitting: Surgery

## 2018-03-23 ENCOUNTER — Encounter: Payer: Medicare Other | Admitting: Surgery

## 2018-03-24 ENCOUNTER — Encounter: Payer: Self-pay | Admitting: Physician Assistant

## 2018-04-06 ENCOUNTER — Other Ambulatory Visit: Payer: Self-pay | Admitting: Oncology

## 2018-04-07 MED FILL — ZYTIGA 250 MG TABLET: 250 | 30 days supply | Qty: 120 | Fill #0

## 2018-04-14 DIAGNOSIS — K210 Gastro-esophageal reflux disease with esophagitis: Secondary | ICD-10-CM | POA: Diagnosis not present

## 2018-04-14 DIAGNOSIS — R933 Abnormal findings on diagnostic imaging of other parts of digestive tract: Secondary | ICD-10-CM | POA: Diagnosis not present

## 2018-04-14 DIAGNOSIS — Z1210 Encounter for screening for malignant neoplasm of intestinal tract, unspecified: Secondary | ICD-10-CM | POA: Diagnosis not present

## 2018-04-18 ENCOUNTER — Ambulatory Visit: Payer: Medicare Other | Admitting: Family Medicine

## 2018-04-27 ENCOUNTER — Encounter: Payer: Self-pay | Admitting: Surgery

## 2018-04-27 ENCOUNTER — Other Ambulatory Visit: Payer: Self-pay

## 2018-04-27 ENCOUNTER — Institutional Professional Consult (permissible substitution): Payer: Medicare Other | Admitting: Surgery

## 2018-04-27 VITALS — BP 136/96 | Resp 16 | Ht 65.0 in | Wt 200.0 lb

## 2018-04-27 DIAGNOSIS — I712 Thoracic aortic aneurysm, without rupture, unspecified: Secondary | ICD-10-CM

## 2018-04-27 NOTE — Progress Notes (Signed)
Cardiothoracic Surgery Consultation  PCP is [name], [name], MD Referring Provider is [name], [name], MD  Chief Complaint  Patient presents with  . TAA    CTA C/A/P 02/02/18    HPI:  The patient is a 77 year old gentleman with a history of hypertension, abdominal aortic aneurysm followed by Dr. Sallyanne Kuster with ultrasound examination and prostate cancer diagnosed in 2017 who underwent external beam radiation followed by radioactive seed implant and androgen deprivation.  He underwent laparoscopic appendectomy for acute appendicitis on 01/21/2018 and was readmitted with shortness of breath, tachypnea, and vomiting.  He had a CTA of the chest which showed a 4.7 cm fusiform ascending aortic aneurysm.  It also showed some inflammatory process around the duodenum.  He underwent endoscopy and was diagnosed with esophagitis, gastritis, and duodenitis.  He gradually recovered and his sense return to see his primary physician who referred him for evaluation of his thoracic aortic aneurysm.  There is no family history of aortic aneurysm disease or connective tissue disorder.  Past Medical History:  Diagnosis Date  . Gastric outlet obstruction 02/03/2018   [name] 02/03/2018  . GERD (gastroesophageal reflux disease)   . History of hiatal hernia    small/notes 02/03/2018  . Hypertension    no meds  . Prostate cancer ([place]) 02/08/14   Gleason 4+5=9, PSA 15.65  . Radiation   . Sinus problem     Past Surgical History:  Procedure Laterality Date  . ESOPHAGOGASTRODUODENOSCOPY (EGD) WITH PROPOFOL N/A 02/03/2018   Procedure: ESOPHAGOGASTRODUODENOSCOPY (EGD) WITH PROPOFOL;  Surgeon: Clarene Essex, MD;  Location: [facility];  Service: Endoscopy;  Laterality: N/A;  . FRACTURE SURGERY    . LAPAROSCOPIC APPENDECTOMY N/A 01/21/2018   Procedure: APPENDECTOMY LAPAROSCOPIC;  Surgeon: Kinsinger, Arta Bruce, MD;  Location: [facility];  Service: General;  Laterality: N/A;  . PROSTATE BIOPSY  02/08/14   gleason 4+5=9,  12/12 cores positive, 54 gm  . RADIOACTIVE SEED IMPLANT N/A 06/29/2014   Procedure: RADIOACTIVE SEED IMPLANT;  Surgeon: Bernestine Amass, MD;  Location: [facility];  Service: Urology;  Laterality: N/A;  . WRIST FRACTURE SURGERY Right 1980s    Family History  Problem Relation Age of Onset  . Alzheimer's disease Mother   . Alzheimer's disease Father   . Cancer Father        prostate  . Arthritis Sister   . Cancer Brother        prostate    Social History Social History   Tobacco Use  . Smoking status: Former Smoker    Years: 60.00    Types: Cigars    Last attempt to quit: 09/14/2012    Years since quitting: 5.6  . Smokeless tobacco: Never Used  . Tobacco comment: little cigars; the small ones"  Substance Use Topics  . Alcohol use: Yes    Alcohol/week: 14.0 standard drinks    Types: 14 Cans of beer per week  . Drug use: No    Current Outpatient Medications  Medication Sig Dispense Refill  . amLODipine (NORVASC) 2.5 MG tablet Take 1 tablet (2.5 mg total) by mouth daily. 90 tablet 2  . aspirin EC 81 MG tablet Take 81 mg by mouth daily.    . fluticasone (FLONASE) 50 MCG/ACT nasal spray Place 1 spray into both nostrils daily as needed for allergies or rhinitis.    [name] LORazepam (ATIVAN) 1 MG tablet TAKE 1/2 TO 1 TABLET DAILY AS NEEDED 30 tablet 2  . Multiple Vitamin (MULTIVITAMIN WITH MINERALS) TABS  tablet Take 1 tablet by mouth daily.    . potassium chloride SA (K-DUR,KLOR-CON) 20 MEQ tablet Take 1 tablet (20 mEq total) by mouth 2 (two) times daily. 60 tablet 0  . predniSONE (DELTASONE) 5 MG tablet TAKE 1 TABLET BY MOUTH EVERY DAY WITH BREAKFAST 90 tablet 0  . ZYTIGA 250 MG tablet TAKE 4 TABLETS (1000 MG) BY MOUTH DAILY. TAKE ON AN EMPTY STOMACH 1 HOUR BEFORE OR 2 HOURS AFTER A MEAL 120 tablet 0   No current facility-administered medications for this visit.     Allergies  Allergen Reactions  . Penicillins Rash    Has patient had a PCN reaction causing immediate  rash, facial/tongue/throat swelling, SOB or lightheadedness with hypotension: Yes Has patient had a PCN reaction causing severe rash involving mucus membranes or skin necrosis: No Has patient had a PCN reaction that required hospitalization: No Has patient had a PCN reaction occurring within the last 10 years: No If all of the above answers are "NO", then may proceed with Cephalosporin use.    Review of Systems  Constitutional: Negative.   HENT: Negative.   Eyes: Negative.   Respiratory: Negative.   Cardiovascular: Negative for chest pain.  Gastrointestinal: Negative.   Endocrine: Negative.   Genitourinary: Negative.   Musculoskeletal: Negative.   Skin: Negative.   Allergic/Immunologic: Negative.   Neurological: Negative.   Hematological: Bruises/bleeds easily.  Psychiatric/Behavioral: The patient is nervous/anxious.     BP (!) 136/96 (BP Location: Right Arm, Patient Position: Sitting, Cuff Size: Large)   Resp 16   Ht 5\' 5"  (1.651 m)   Wt 200 lb (90.7 kg)   BMI 33.28 kg/m  Physical Exam  Constitutional: He is oriented to person, place, and time. He appears well-developed and well-nourished. No distress.  HENT:  Head: Normocephalic and atraumatic.  Mouth/Throat: Oropharynx is clear and moist.  Eyes: Pupils are equal, round, and reactive to light. Conjunctivae and EOM are normal.  Neck: Normal range of motion. Neck supple. No thyromegaly present.  Cardiovascular: Normal rate, regular rhythm, normal heart sounds and intact distal pulses.  No murmur heard. Pulmonary/Chest: Effort normal and breath sounds normal. No respiratory distress.  Abdominal: Soft. Bowel sounds are normal. He exhibits no distension. There is no tenderness.  Musculoskeletal: Normal range of motion. He exhibits no edema.  Lymphadenopathy:    He has no cervical adenopathy.  Neurological: He is alert and oriented to person, place, and time.  Skin: Skin is warm and dry.  Psychiatric: He has a normal mood  and affect.     Diagnostic Tests:  CLINICAL DATA:  Shortness of breath tachypnea and vomiting. Laboratory evidence of sepsis. Recent laparoscopic appendectomy for acute appendicitis on 01/21/2018.  EXAM: CT ANGIOGRAPHY CHEST  CT ABDOMEN AND PELVIS WITH CONTRAST  TECHNIQUE: Multidetector CT imaging of the chest was performed using the standard protocol during bolus administration of intravenous contrast. Multiplanar CT image reconstructions and MIPs were obtained to evaluate the vascular anatomy. Multidetector CT imaging of the abdomen and pelvis was performed using the standard protocol during bolus administration of intravenous contrast.  CONTRAST:  100 mL ISOVUE-370 IOPAMIDOL (ISOVUE-370) INJECTION IV  COMPARISON:  CT of the abdomen and pelvis on 01/21/2018  FINDINGS: CTA CHEST FINDINGS  Cardiovascular: Satisfactory opacification of the pulmonary arteries to the segmental level. No evidence of pulmonary embolism. Normal heart size. No pericardial effusion. Coronary atherosclerosis evident with calcified plaque in a 3 vessel distribution.  The aortic root is mildly dilated and measures 4.2 cm in  diameter at the level of the sinuses of Valsalva. There is dilatation of the ascending thoracic aorta which measures 4.7 cm in greatest diameter. The aorta reaches a normal caliber of 3.7 cm at the level of the aortic arch. No evidence to suggest aortic dissection. Visualized proximal great vessels show normally patent origins.  Mediastinum/Nodes: The esophagus is mildly distended and contains an air-fluid level in its mid segment and fluid in its distal segment.  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 No evidence of pulmonary consolidation, mass, pneumothorax, edema or pleural fluid.  Musculoskeletal: No chest wall abnormality. No acute or significant osseous findings.  Review of the MIP images confirms the  above findings.  CT ABDOMEN and PELVIS FINDINGS  Hepatobiliary: No focal liver abnormality is seen. No gallstones, gallbladder wall thickening, or biliary dilatation.  Pancreas: Continued atrophic appearance of the pancreas which is infiltrated with fat.  Spleen: Normal in size without focal abnormality.  Adrenals/Urinary Tract: Adrenal glands are unremarkable. Kidneys are normal, without renal calculi, focal lesion, or hydronephrosis. Bladder is unremarkable.  Stomach/Bowel: There is thickening involving the distal descending portion of the duodenum as well as the third portion of the duodenum. This is associated with a significant large area of inflammation in the retroperitoneum with complex fluid present over an area measuring approximately 8 cm in transverse diameter and up to 13 cm in height. This was not present on the recent CT on 05/10. Differential considerations include inflammatory process of the duodenum such as ulcer disease with perforation and surrounding inflammation and also hemorrhage in this region.  This is removed from the head of the pancreas and acute pancreatitis is felt much less likely to be a possibility for findings. There likely is some degree of narrowing of the duodenal lumen at this level as the proximal duodenum and stomach are distended and filled with fluid likely reflecting relative outlet obstruction. No evidence of extraluminal air. No small bowel or colonic dilatation is identified distal to the duodenum.  No evidence of postoperative complication at the site of appendectomy. No focal abscess identified.  Vascular/Lymphatic: No significant vascular findings are present. No enlarged abdominal or pelvic lymph nodes.  Reproductive: Brachytherapy seeds are present in the prostate gland.  Other: Bilateral inguinal hernias contain fat, right greater than left.  Musculoskeletal: No acute or significant osseous  findings.  Review of the MIP images confirms the above findings.  IMPRESSION: 1. No acute findings in the chest. Incidental note is made of aneurysmal disease of the thoracic aorta with maximal caliber of 4.7 cm at the level of the ascending thoracic aorta. Ascending thoracic aortic aneurysm. Recommend semi-annual imaging followup by CTA or MRA and referral to cardiothoracic surgery if not already obtained. This recommendation follows 2010 ACCF/AHA/AATS/ACR/ASA/SCA/SCAI/SIR/STS/SVM Guidelines for the Diagnosis and Management of Patients With Thoracic Aortic Disease. Circulation. 2010; 121: Z308-M578 2. Coronary atherosclerosis with calcified plaque in a 3 vessel distribution. 3. New significant inflammatory process versus hemorrhage surrounding the descending duodenum in the retroperitoneum. This is likely causing relative obstruction of the duodenal lumen with resultant significant distention of the stomach and distal esophagus with fluid. The patient may benefit from nasogastric decompression of the stomach. It is somewhat unclear what the etiology of the retroperitoneal findings are. This may be on the basis of an inflammatory process such as duodenitis/ruptured duodenal ulcer versus retroperitoneal hemorrhage surrounding the duodenum. Pancreatitis involving the lateral inferior pancreatic head is felt to be much less likely given location of the  dominant retroperitoneal abnormality.   Electronically Signed   By: Aletta Edouard M.D.   On: 02/02/2018 21:05  Impression:  This 77 year old gentleman has a 4.7 cm fusiform ascending aortic aneurysm of unknown duration.  This is still well below the 5.5 cm surgical threshold.  His CT of the abdomen showed that the infrarenal abdominal aortic aneurysm measures 3.1 cm.  This is also well below the surgical threshold.  I reviewed the CT images with the patient and his wife and answered their questions.  I discussed the importance  of good blood pressure control in preventing further enlargement of the aneurysm and aortic dissection.  His blood pressure is mildly elevated today.  He is on antihypertensive therapy.  I have recommended he have a follow-up CTA of the chest in 1 year.  I also advised him against heavy lifting of more than 35 pounds or isometric exercises where a Valsalva maneuver is performed which can cause blood pressure spikes.  Plan:  I will plan to see him back in 1 year with a CTA of the chest.   I spent 30 minutes performing this consultation and > 50% of this time was spent face to face counseling and coordinating the care of this patient's thoracic aortic aneurysm.   Gaye Pollack, MD [facility] [phone]

## 2018-05-04 ENCOUNTER — Other Ambulatory Visit: Payer: Self-pay | Admitting: Oncology

## 2018-05-10 MED FILL — ZYTIGA 250 MG TABLET: 250 | 30 days supply | Qty: 120 | Fill #0

## 2018-05-19 ENCOUNTER — Other Ambulatory Visit: Payer: Self-pay | Admitting: Oncology

## 2018-05-20 ENCOUNTER — Other Ambulatory Visit: Payer: Self-pay | Admitting: *Deleted

## 2018-05-20 DIAGNOSIS — K269 Duodenal ulcer, unspecified as acute or chronic, without hemorrhage or perforation: Secondary | ICD-10-CM | POA: Diagnosis not present

## 2018-05-20 DIAGNOSIS — K228 Other specified diseases of esophagus: Secondary | ICD-10-CM | POA: Diagnosis not present

## 2018-05-20 DIAGNOSIS — K209 Esophagitis, unspecified: Secondary | ICD-10-CM | POA: Diagnosis not present

## 2018-05-20 DIAGNOSIS — K449 Diaphragmatic hernia without obstruction or gangrene: Secondary | ICD-10-CM | POA: Diagnosis not present

## 2018-05-20 DIAGNOSIS — Z1211 Encounter for screening for malignant neoplasm of colon: Secondary | ICD-10-CM | POA: Diagnosis not present

## 2018-05-20 DIAGNOSIS — C61 Malignant neoplasm of prostate: Secondary | ICD-10-CM

## 2018-05-20 DIAGNOSIS — K2270 Barrett's esophagus without dysplasia: Secondary | ICD-10-CM | POA: Diagnosis not present

## 2018-05-20 DIAGNOSIS — D123 Benign neoplasm of transverse colon: Secondary | ICD-10-CM | POA: Diagnosis not present

## 2018-05-20 DIAGNOSIS — D120 Benign neoplasm of cecum: Secondary | ICD-10-CM | POA: Diagnosis not present

## 2018-05-20 MED ORDER — PREDNISONE 5 MG PO TABS: 5.0000 mg | ORAL_TABLET | Freq: Every day | ORAL | 1 refills | Status: DC

## 2018-05-22 NOTE — Progress Notes (Signed)
HPI:   [name] is a 77 y.o. male, who is here today for 6 months follow up.   He was last seen on 02/18/18.  Since his last OV he has seen Dr Lavera Guise surgeon to follow on thoracic aneurysm. Also saw Dr [name], oncologist,prostate cancer on androgen deprivation therapy. He also has Hx of AAA and follows with Dr Sallyanne Kuster. According to pt,he is supposed to continue following annually. His wife is concerned and is asking about etiology.  Former smoker.  Last Friday he underwent EGD and colonoscopy at [facility].    HTN: On Amlodipine 2.5 mg daily.  Lab Results  Component Value Date   CREATININE 0.93 03/02/2018   BUN 19 03/02/2018   NA 139 03/02/2018   K 4.1 03/02/2018   CL 104 03/02/2018   CO2 25 03/02/2018   Abdominal CT 01/21/18: Unchanged 3.1 cm abdominal aortic aneurysm. Recommend followup by ultrasound in 3 years.   Chest CTA 02/02/2018: No acute findings in the chest. Incidental note is made of aneurysmal disease of the thoracic aorta with maximal caliber of 4.7 cm at the level of the ascending thoracic aorta. Ascending thoracic aortic aneurysm.   Anxiety and insomnia. On Lorazepam, which he was taking 1/2 tab bid. He is sleeping better since his hospital discharged, so he is not taking the night dose. [name]  He has not taken Trazodone in a few days,taking it prn. Wakes up because nocturia x 3 but able to go back to sleep.  Tolerating medications well ,no side effects.    Review of Systems  Constitutional: Negative for activity change, appetite change, fatigue and fever.  HENT: Negative for nosebleeds, sore throat and trouble swallowing.   Eyes: Negative for redness and visual disturbance.  Respiratory: Negative for cough, shortness of breath and wheezing.   Cardiovascular: Negative for chest pain, palpitations and leg swelling.  Gastrointestinal: Negative for abdominal pain, nausea and vomiting.  Genitourinary: Negative for decreased  urine volume, dysuria and hematuria.  Neurological: Negative for syncope, weakness and headaches.  Psychiatric/Behavioral: Positive for sleep disturbance. The patient is nervous/anxious.      Current Outpatient Medications on File Prior to Visit  Medication Sig Dispense Refill  . aspirin EC 81 MG tablet Take 81 mg by mouth daily.    . fluticasone (FLONASE) 50 MCG/ACT nasal spray Place 1 spray into both nostrils daily as needed for allergies or rhinitis.    [name] LORazepam (ATIVAN) 1 MG tablet TAKE 1/2 TO 1 TABLET DAILY AS NEEDED 30 tablet 2  . Multiple Vitamin (MULTIVITAMIN WITH MINERALS) TABS tablet Take 1 tablet by mouth daily.    [name] omeprazole (PRILOSEC) 20 MG capsule     . potassium chloride SA (K-DUR,KLOR-CON) 20 MEQ tablet Take 1 tablet (20 mEq total) by mouth 2 (two) times daily. 60 tablet 0  . predniSONE (DELTASONE) 5 MG tablet Take 1 tablet (5 mg total) by mouth daily with breakfast. 90 tablet 1  . traZODone (DESYREL) 50 MG tablet     . ZYTIGA 250 MG tablet TAKE 4 TABLETS (1000 MG) BY MOUTH DAILY. TAKE ON AN EMPTY STOMACH 1 HOUR BEFORE OR 2 HOURS AFTER A MEAL 120 tablet 0   No current facility-administered medications on file prior to visit.      Past Medical History:  Diagnosis Date  . Gastric outlet obstruction 02/03/2018   [name] 02/03/2018  . GERD (gastroesophageal reflux disease)   . History of hiatal hernia    small/notes 02/03/2018  .  Hypertension    no meds  . Prostate cancer ([place]) 02/08/14   Gleason 4+5=9, PSA 15.65  . Radiation   . Sinus problem    Allergies  Allergen Reactions  . Penicillins Rash    Has patient had a PCN reaction causing immediate rash, facial/tongue/throat swelling, SOB or lightheadedness with hypotension: Yes Has patient had a PCN reaction causing severe rash involving mucus membranes or skin necrosis: No Has patient had a PCN reaction that required hospitalization: No Has patient had a PCN reaction occurring within the last 10 years: No If  all of the above answers are "NO", then may proceed with Cephalosporin use.    Social History   Socioeconomic History  . Marital status: Married    Spouse name: Not on file  . Number of children: Not on file  . Years of education: Not on file  . Highest education level: Not on file  Occupational History  . Not on file  Social Needs  . Financial resource strain: Not on file  . Food insecurity:    Worry: Not on file    Inability: Not on file  . Transportation needs:    Medical: Not on file    Non-medical: Not on file  Tobacco Use  . Smoking status: Former Smoker    Years: 60.00    Types: Cigars    Last attempt to quit: 09/14/2012    Years since quitting: 5.6  . Smokeless tobacco: Never Used  . Tobacco comment: little cigars; the small ones"  Substance and Sexual Activity  . Alcohol use: Yes    Alcohol/week: 14.0 standard drinks    Types: 14 Cans of beer per week  . Drug use: No  . Sexual activity: Not Currently  Lifestyle  . Physical activity:    Days per week: Not on file    Minutes per session: Not on file  . Stress: Not on file  Relationships  . Social connections:    Talks on phone: Not on file    Gets together: Not on file    Attends religious service: Not on file    Active member of club or organization: Not on file    Attends meetings of clubs or organizations: Not on file    Relationship status: Not on file  Other Topics Concern  . Not on file  Social History Narrative  . Not on file    Vitals:   05/23/18 0956  BP: 130/76  Pulse: 60  Resp: 16  Temp: 97.9 F (36.6 C)  SpO2: 95%   Body mass index is 32.99 kg/m.  Physical Exam  Nursing note and vitals reviewed. Constitutional: He is oriented to person, place, and time. He appears well-developed. No distress.  HENT:  Head: Normocephalic and atraumatic.  Mouth/Throat: Oropharynx is clear and moist and mucous membranes are normal.  Eyes: Pupils are equal, round, and reactive to light. Conjunctivae  are normal.  Cardiovascular: Normal rate. An irregular rhythm present.  No murmur heard. Pulses:      Posterior tibial pulses are 2+ on the right side, and 2+ on the left side.  Respiratory: Effort normal and breath sounds normal. No respiratory distress.  GI: Soft. He exhibits no mass. There is no hepatomegaly. There is no tenderness.  Musculoskeletal: He exhibits edema (Trace pitting LE edema,bilateral.).  Lymphadenopathy:    He has no cervical adenopathy.  Neurological: He is alert and oriented to person, place, and time. He has normal strength. No cranial nerve deficit.  Otherwise stable gait with no assistance.  Skin: Skin is warm. No rash noted. No erythema.  Psychiatric: He has a normal mood and affect. Cognition and memory are normal.  Well groomed, good eye contact.     ASSESSMENT AND PLAN:   [name] was seen today for 6 months follow-up.  No orders of the defined types were placed in this encounter.   Insomnia Sleep has improved. Continue trazodone 25 to 50 mg at bedtime as needed. Good sleep hygiene. Follow-up in 6 months.  Essential hypertension, benign Adequately controlled. No changes in current management. Continue low-salt diet. Eye exam recommended annually. F/U in 6 months, before if needed.   Anxiety disorder Stable overall. Continue current management, Xanax 1 mg 1/2 tab bid. Side effects of medication were discussed. Follow-up in 6 months, before if needed.   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 HR is usually on the lower norma and occasionally mildly bradycardic,so I do not think he is a good candidate for BB's.  He will continue following with cardiovascular surgeon. We discussed some warning signs. Adequate BP and HR control.  We need to avoid quinolones (also because arrhythmia).  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HCC)  Possible etiologies discussed as well as treatment options  and criteria for surgery. Continue following with cardiologist. Abdominal US q 3 years. Instructed about warning signs.      Armilda Vanderlinden G. [name], MD  [facility]. [place] office.

## 2018-05-23 ENCOUNTER — Encounter: Payer: Self-pay | Admitting: Family Medicine

## 2018-05-23 ENCOUNTER — Other Ambulatory Visit: Payer: Self-pay | Admitting: *Deleted

## 2018-05-23 ENCOUNTER — Ambulatory Visit (INDEPENDENT_AMBULATORY_CARE_PROVIDER_SITE_OTHER): Payer: Medicare Other | Admitting: Family Medicine

## 2018-05-23 VITALS — BP 130/76 | HR 60 | Temp 97.9°F | Resp 16 | Ht 65.0 in | Wt 198.2 lb

## 2018-05-23 DIAGNOSIS — I712 Thoracic aortic aneurysm, without rupture, unspecified: Secondary | ICD-10-CM

## 2018-05-23 DIAGNOSIS — G4700 Insomnia, unspecified: Secondary | ICD-10-CM

## 2018-05-23 DIAGNOSIS — I10 Essential (primary) hypertension: Secondary | ICD-10-CM | POA: Diagnosis not present

## 2018-05-23 DIAGNOSIS — I714 Abdominal aortic aneurysm, without rupture, unspecified: Secondary | ICD-10-CM

## 2018-05-23 DIAGNOSIS — F418 Other specified anxiety disorders: Secondary | ICD-10-CM | POA: Diagnosis not present

## 2018-05-23 MED ORDER — AMLODIPINE BESYLATE 2.5 MG PO TABS: 2.5000 mg | ORAL_TABLET | Freq: Every day | ORAL | 2 refills | Status: DC

## 2018-05-23 NOTE — Patient Instructions (Addendum)
A few things to remember from today's visit:   Essential hypertension, benign  Other specified anxiety disorders  Insomnia, unspecified type  Thoracic aortic aneurysm without rupture ([place])  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HCC)  No changes today. Continue Trazodone as needed.    Please be sure medication list is accurate. If a new problem present, please set up appointment sooner than planned today.

## 2018-05-23 NOTE — Assessment & Plan Note (Signed)
Stable overall. Continue current management, Xanax 1 mg 1/2 tab bid. Side effects of medication were discussed. Follow-up in 6 months, before if needed.

## 2018-05-23 NOTE — Assessment & Plan Note (Signed)
Adequately controlled. No changes in current management. Continue low salt diet. Eye exam recommended annually. F/U in 6 months, before if needed.  

## 2018-05-23 NOTE — Assessment & Plan Note (Signed)
Sleep has improved. Continue trazodone 25 to 50 mg at bedtime as needed. Good sleep hygiene. Follow-up in 6 months.

## 2018-05-25 DIAGNOSIS — Z1211 Encounter for screening for malignant neoplasm of colon: Secondary | ICD-10-CM | POA: Diagnosis not present

## 2018-05-25 DIAGNOSIS — K2270 Barrett's esophagus without dysplasia: Secondary | ICD-10-CM | POA: Diagnosis not present

## 2018-05-25 DIAGNOSIS — K209 Esophagitis, unspecified: Secondary | ICD-10-CM | POA: Diagnosis not present

## 2018-05-25 DIAGNOSIS — D120 Benign neoplasm of cecum: Secondary | ICD-10-CM | POA: Diagnosis not present

## 2018-05-25 DIAGNOSIS — D123 Benign neoplasm of transverse colon: Secondary | ICD-10-CM | POA: Diagnosis not present

## 2018-05-26 ENCOUNTER — Encounter: Payer: Self-pay | Admitting: Family Medicine

## 2018-05-31 ENCOUNTER — Telehealth: Payer: Self-pay | Admitting: Family Medicine

## 2018-05-31 ENCOUNTER — Other Ambulatory Visit: Payer: Self-pay | Admitting: *Deleted

## 2018-05-31 NOTE — Telephone Encounter (Signed)
Message sent to Dr. [name] for review and approval. 

## 2018-05-31 NOTE — Telephone Encounter (Signed)
Pt was prescribed the Magnesium and potassium chloride 02/09/18 from a hospitalist at for discharge medications.

## 2018-05-31 NOTE — Telephone Encounter (Signed)
Copied from [place] [id number]. Topic: Quick Communication - Rx Refill/Question >> May 31, 2018  9:51 AM Sheppard Coil, [employer] wrote: Medication:  potassium chloride SA (K-DUR,KLOR-CON) 20 MEQ tablet Magnesium 400mg   Stu from [facility] calling to request these since pt is transferring pharmacies  Preferred Pharmacy (with phone number or street name): [facility] - [place], [place] [address] [phone] (Phone) [phone] (Fax)  Agent: Please be advised that RX refills may take up to 3 business days. We ask that you follow-up with your pharmacy.

## 2018-06-01 ENCOUNTER — Other Ambulatory Visit: Payer: Self-pay | Admitting: Oncology

## 2018-06-06 ENCOUNTER — Ambulatory Visit: Payer: Medicare Other | Admitting: Cardiovascular Disease

## 2018-06-06 ENCOUNTER — Other Ambulatory Visit: Payer: Self-pay | Admitting: Family Medicine

## 2018-06-06 ENCOUNTER — Encounter: Payer: Self-pay | Admitting: Cardiovascular Disease

## 2018-06-06 VITALS — BP 136/70 | HR 55 | Ht 67.0 in | Wt 198.2 lb

## 2018-06-06 DIAGNOSIS — R5382 Chronic fatigue, unspecified: Secondary | ICD-10-CM | POA: Diagnosis not present

## 2018-06-06 DIAGNOSIS — I714 Abdominal aortic aneurysm, without rupture, unspecified: Secondary | ICD-10-CM

## 2018-06-06 DIAGNOSIS — I453 Trifascicular block: Secondary | ICD-10-CM | POA: Diagnosis not present

## 2018-06-06 DIAGNOSIS — I2510 Atherosclerotic heart disease of native coronary artery without angina pectoris: Secondary | ICD-10-CM

## 2018-06-06 DIAGNOSIS — I495 Sick sinus syndrome: Secondary | ICD-10-CM | POA: Diagnosis not present

## 2018-06-06 DIAGNOSIS — I712 Thoracic aortic aneurysm, without rupture, unspecified: Secondary | ICD-10-CM

## 2018-06-06 DIAGNOSIS — E7800 Pure hypercholesterolemia, unspecified: Secondary | ICD-10-CM

## 2018-06-06 DIAGNOSIS — I10 Essential (primary) hypertension: Secondary | ICD-10-CM

## 2018-06-06 MED ORDER — POTASSIUM CHLORIDE CRYS ER 20 MEQ PO TBCR: 20.0000 meq | EXTENDED_RELEASE_TABLET | Freq: Every day | ORAL | 0 refills | Status: DC

## 2018-06-06 NOTE — Progress Notes (Signed)
Cardiology OfficeNote    Date:  06/08/2018   ID:  Tony [name], DOB 28-May-1941, MRN [id number]  PCP:  [name], [name], MD  Cardiologist:   Sanda Klein, MD    Chief Complaint  Patient presents with  . Follow-up    asymptomatic coronary calcifications  . Thoracic Aortic Aneurysm  . AAA    History of Present Illness:  Tony [name] is a 77 y.o. male with asymptomatic peripheral and coronary arterial calcification on imaging studies performed for workup of prostate cancer, AAA and recently incidentally diagnosed aneurysm of the ascending aorta on chest CT.  He was admitted with acute appendicitis and underwent urgent surgery on May 10.  He was then readmitted about 2 weeks later with gastric outlet obstruction and nausea and vomiting and underwent a chest CT.  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  He was discovered to have an ascending aortic aneurysm measured at 4.7 cm (although previous imaging studies had shown coronary and aortic atherosclerosis, they had not been high enough to evaluate the ascending aorta).  In August he underwent upper endoscopy and colonoscopy with benign findings.  In September 2017 his nuclear vasodilator stress test was a low risk study with a small/mild area of ischemia in the apex. EF 59%. He has not had angina pectoris.  He no longer smokes.  Activity is limited by gait instability and he uses a cane.  He denies exertional dyspnea.  He also denies syncope, palpitations, focal neurological events, chest pain, claudication.  He continues to complain of fatigue.  He remains relatively bradycardic with a heart rate of 55 bpm today.  On May 23 his EKG showed sinus rhythm at 81 bpm.  All his recent tracings have shown right bundle branch block, left anterior fascicular block and slightly prolonged PR interval at 200 ms or so.  He is now on second line chemotherapy for prostate cancer refractory to  antiandrogen therapy, .  Past Medical History:  Diagnosis Date  . Gastric outlet obstruction 02/03/2018   [name] 02/03/2018  . GERD (gastroesophageal reflux disease)   . History of hiatal hernia    small/notes 02/03/2018  . Hypertension    no meds  . Prostate cancer ([place]) 02/08/14   Gleason 4+5=9, PSA 15.65  . Radiation   . Sinus problem     Past Surgical History:  Procedure Laterality Date  . ESOPHAGOGASTRODUODENOSCOPY (EGD) WITH PROPOFOL N/A 02/03/2018   Procedure: ESOPHAGOGASTRODUODENOSCOPY (EGD) WITH PROPOFOL;  Surgeon: Clarene Essex, MD;  Location: [facility];  Service: Endoscopy;  Laterality: N/A;  . FRACTURE SURGERY    . LAPAROSCOPIC APPENDECTOMY N/A 01/21/2018   Procedure: APPENDECTOMY LAPAROSCOPIC;  Surgeon: Kinsinger, Arta Bruce, MD;  Location: [facility];  Service: General;  Laterality: N/A;  . PROSTATE BIOPSY  02/08/14   gleason 4+5=9, 12/12 cores positive, 54 gm  . RADIOACTIVE SEED IMPLANT N/A 06/29/2014   Procedure: RADIOACTIVE SEED IMPLANT;  Surgeon: Bernestine Amass, MD;  Location: [facility];  Service: Urology;  Laterality: N/A;  . WRIST FRACTURE SURGERY Right 1980s    Current Medications: Outpatient Medications Prior to Visit  Medication Sig Dispense Refill  . amLODipine (NORVASC) 2.5 MG tablet Take 1 tablet (2.5 mg total) by mouth daily. 90 tablet 2  . aspirin EC 81 MG tablet Take 81 mg by mouth daily.    . fluticasone (FLONASE) 50 MCG/ACT nasal spray Place 1 spray into both nostrils daily as needed for allergies or  rhinitis.    [name] LORazepam (ATIVAN) 1 MG tablet TAKE 1/2 TO 1 TABLET DAILY AS NEEDED 30 tablet 2  . Multiple Vitamin (MULTIVITAMIN WITH MINERALS) TABS tablet Take 1 tablet by mouth daily.    [name] omeprazole (PRILOSEC) 20 MG capsule     . predniSONE (DELTASONE) 5 MG tablet Take 1 tablet (5 mg total) by mouth daily with breakfast. 90 tablet 1  . traZODone (DESYREL) 50 MG tablet     . ZYTIGA 250 MG tablet TAKE 4 TABLETS (1000 MG) BY MOUTH DAILY.  TAKE ON AN EMPTY STOMACH 1 HOUR BEFORE OR 2 HOURS AFTER A MEAL 120 tablet 0  . potassium chloride SA (K-DUR,KLOR-CON) 20 MEQ tablet Take 1 tablet (20 mEq total) by mouth 2 (two) times daily. 60 tablet 0   No facility-administered medications prior to visit.      Allergies:   Penicillins   Social History   Socioeconomic History  . Marital status: Married    Spouse name: Not on file  . Number of children: Not on file  . Years of education: Not on file  . Highest education level: Not on file  Occupational History  . Not on file  Social Needs  . Financial resource strain: Not on file  . Food insecurity:    Worry: Not on file    Inability: Not on file  . Transportation needs:    Medical: Not on file    Non-medical: Not on file  Tobacco Use  . Smoking status: Former Smoker    Years: 60.00    Types: Cigars    Last attempt to quit: 09/14/2012    Years since quitting: 5.7  . Smokeless tobacco: Never Used  . Tobacco comment: little cigars; the small ones"  Substance and Sexual Activity  . Alcohol use: Yes    Alcohol/week: 14.0 standard drinks    Types: 14 Cans of beer per week  . Drug use: No  . Sexual activity: Not Currently  Lifestyle  . Physical activity:    Days per week: Not on file    Minutes per session: Not on file  . Stress: Not on file  Relationships  . Social connections:    Talks on phone: Not on file    Gets together: Not on file    Attends religious service: Not on file    Active member of club or organization: Not on file    Attends meetings of clubs or organizations: Not on file    Relationship status: Not on file  Other Topics Concern  . Not on file  Social History Narrative  . Not on file     Family History:  The patient's family history includes Alzheimer's disease in his father and mother; Arthritis in his sister; Cancer in his brother and father.   ROS:   Please see the history of present illness.    ROS All other systems reviewed and are  negative.   PHYSICAL EXAM:   VS:  BP 136/70 (BP Location: Left Arm, Patient Position: Sitting, Cuff Size: Normal)   Pulse (!) 55   Ht 5\' 7"  (1.702 m)   Wt 198 lb 3.2 oz (89.9 kg)   BMI 31.04 kg/m     General: Alert, oriented x3, no distress, mildly obese Head: no evidence of trauma, PERRL, EOMI, no exophtalmos or lid lag, no myxedema, no xanthelasma; normal ears, nose and oropharynx Neck: normal jugular venous pulsations and no hepatojugular reflux; brisk carotid pulses without delay and no carotid  bruits Chest: clear to auscultation, no signs of consolidation by percussion or palpation, normal fremitus, symmetrical and full respiratory excursions Cardiovascular: normal position and quality of the apical impulse, regular rhythm, normal first and widely split second heart sounds, no murmurs, rubs or gallops Abdomen: no tenderness or distention, no masses by palpation, no abnormal pulsatility or arterial bruits, normal bowel sounds, no hepatosplenomegaly Extremities: no clubbing, cyanosis or edema; 2+ radial, ulnar and brachial pulses bilaterally; 2+ right femoral, posterior tibial and dorsalis pedis pulses; 2+ left femoral, posterior tibial and dorsalis pedis pulses; no subclavian or femoral bruits Neurological: grossly nonfocal Psych: Normal mood and affect   Wt Readings from Last 3 Encounters:  06/06/18 198 lb 3.2 oz (89.9 kg)  05/23/18 198 lb 4 oz (89.9 kg)  04/27/18 200 lb (90.7 kg)      Studies/Labs Reviewed:   EKG:  EKG is not ordered today.  The ekg ordered 02/03/2018 demonstrates sinus rhythm, right bundle branch block, left anterior fascicular block, borderline PR interval 200 ms, QTC 428 ms  Recent Labs: 02/02/2018: B Natriuretic Peptide 226.5 02/18/2018: Magnesium 2.2 06/08/2018: ALT 14; BUN 16; Creatinine 1.00; Hemoglobin 16.2; Platelet Count 160; Potassium 4.6; Sodium 144   Lipid Panel    Component Value Date/Time   CHOL 200 10/19/2017 1039   TRIG 92.0 10/19/2017 1039    HDL 74.30 10/19/2017 1039   CHOLHDL 3 10/19/2017 1039   VLDL 18.4 10/19/2017 1039   LDLCALC 107 (H) 10/19/2017 1039   ASSESSMENT:    1. Sick sinus syndrome ([place])   2. Chronic fatigue   3. Trifascicular block   4. Coronary artery disease involving native coronary artery of native heart without angina pectoris   5. Thoracic aortic aneurysm without rupture ([place])   6. 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[place])   7. Hypercholesterolemia   8. Essential hypertension      PLAN:  In order of problems listed above:  1. Sinus bradycardia/SSS: Tony [name] has clear evidence of sinus node dysfunction.  He has complaints of chronic fatigue.  Even when he was acutely ill with appendicitis his heart rate peaked at only about 90 bpm.  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  I think his fatigue is at least partly related to this.  He would benefit from a rate response sensor implantation (preferably a device with minute ventilation sensor). This procedure has been fully reviewed with the patient and written informed consent has been obtained. 2. RBBB+LAFB+long PR: He has never experienced syncope or other overt findings to suggest high-grade AV block, but clearly has extensive infrahisian disease  3. CAD: Low risk stress test 2017 but with a small area of ischemia.  As long as he remains asymptomatic and has normal left ventricular systolic function.  Note that he is taking amlodipine (which might be functioning as an antianginal), but in a very low dose.  Invasive evaluation does not appear indicated. 4. Ascending aortic aneurysm: Moderate in size and asymptomatic.  Reevaluate with CT angiography in 1 year.  5. AAA: Unchanged on serial studies more than a year apart.  We will cancel the ultrasound duplex that was scheduled for next month and will reevaluate the abdominal aneurysm on the same CT angiogram used to evaluate his ascending  aneurysm.  He cannot take beta blockers due to bradycardia, we should consider this once his device is implanted..  6. HLP: Target LDL-C preferably less than 70, probably need to start a statin.  Needs an updated lipid profile.  Will discuss at the time of his pacemaker implantation.  Congratulated him on stopping smoking. 7. HTN: Good blood pressure control on low-dose amlodipine monotherapy.  At home his blood pressure is usually lower.   Medication Adjustments/Labs and Tests Ordered: Current medicines are reviewed at length with the patient today.  Concerns regarding medicines are outlined above.  Medication changes, Labs and Tests ordered today are listed in the Patient Instructions below. Patient Instructions  Cancel your AAA duplex scheduled for 07/06/18.  Your physician has requested that you have a CT of your aorta in MAY 2020. Non-Cardiac CT scanning, (CAT scanning), is a noninvasive, special x-ray that produces cross-sectional images of the body using x-rays and a computer. CT scans help physicians diagnose and treat medical conditions. For some CT exams, a contrast material is used to enhance visibility in the area of the body being studied. CT scans provide greater clarity and reveal more details than regular x-ray exams. This will be performed at our [facility] location. [address], [place] [address] [phone]    [facility] [place], [place]  [place], [place] [address]  Phone: [phone] Fax: [phone]  You will be scheduled for a Permanent Transvenous Pacemaker Implant on Tuesday, October 1st with Dr Sallyanne Kuster.  Please arrive at the [place] [facility] [facility] on the day of your procedure. I will call you with further instructions regarding the time of your procedure.  1. You may have a light, early breakfast the morning of your procedure. NOTHING TO EAT AFTER  8:00 AM. 2. Complete labwork the week before the procedure. There is a lab located within our office if we are convenient for you. You do not have to be fasting. 3. You may continue your current medications. 4. Plan for an overnight stay. 5. Bring your insurance cards and a list of your current medications. 6. Wash your chest and neck with the surgical scrub provided the evening before and the morning of your procedure. Rinse well. Instructions have been provided.  * Special note:  Every effort is made to have your procedure done on time.  Occasionally there are emergencies that present themselves at the hospital that may cause delays.  Please be patient if a delay does occur.  If you have ANY questions after you get home, please call the office [phone].  Chelley, CMA Dr Sallyanne Kuster    Preparing for Surgery  Before surgery, you can play an important role. Because skin is not sterile, your skin needs to be as free of germs as possible. You can reduce the number of germs on your skin by washing with CHG (chlorhexidine gluconate) Soap before surgery. CHG is an antiseptic cleaner which kills germs and bonds with the skin to continue killing germs even after washing.  Please do not use if you have an allergy to CHG or antibacterial soaps. If your skin becomes reddened/irritated, STOP using the CHG.  DO NOT SHAVE (including legs and underarms) for at least 48 hours prior to first CHG shower. It is OK to shave your face.  Please follow these instructions carefully: 1. Shower the night before surgery and the morning of surgery with CHG Soap. 2. If you chose to wash your hair, wash your hair first as usual with your normal shampoo/conditioner. 3. After you shampoo/condition, rinse you hair and body thoroughly to remove shampoo/conditioner.  4. Use CHG as you would any other liquid soap. You can apply CHG directly to the skin and wash gently with a loofah or a clean washcloth. 5. Apply the CHG  Soap to your body ONLY FROM THE NECK DOWN. Do not use on open wounds or open sores. Avoid contact with your eyes, ears, mouth, and genitals (private parts). Wash genitals (private part) with your normal soap. 6. Wash thoroughly, paying special attention to the area where your surgery will be performed. 7. Thoroughly rinse your body with warm water from the neck down. 8. DO NOT shower/wash with your normal soap after using and rinsing off the CHG Soap. 9. Pat yourself dry with a clean towel. 10. Wear clean pajamas to bed. 11. Place clean sheets on your bed the night of your first shower and do not sleep with pets..  Day of Surgery: Shower with the CHG Soap following the instructions listed above. DO NOT apply deodorants or lotions. Please wear clean clothes to the hospital/surgery center.      Signed, Sanda Klein, MD  06/08/2018 3:58 PM    [place], [place],   [address] Phone: [phone]; Fax: [phone]

## 2018-06-06 NOTE — Telephone Encounter (Signed)
Prescription for K-Lor 20 mEq was sent to his pharmacy, decrease dose from 1 tablet twice daily to 1 tablet daily. OTC Magnesium oxide 400 mg daily can be continued. Thanks, BJ

## 2018-06-06 NOTE — Patient Instructions (Addendum)
Cancel your AAA duplex scheduled for 07/06/18.  Your physician has requested that you have a CT of your aorta in MAY 2020. Non-Cardiac CT scanning, (CAT scanning), is a noninvasive, special x-ray that produces cross-sectional images of the body using x-rays and a computer. CT scans help physicians diagnose and treat medical conditions. For some CT exams, a contrast material is used to enhance visibility in the area of the body being studied. CT scans provide greater clarity and reveal more details than regular x-ray exams. This will be performed at our [facility] location. [address] [place] [address] [phone]     [facility] [place], [place]  [place], [place] [address]  Phone: [phone] Fax: [phone]  You will be scheduled for a Permanent Transvenous Pacemaker Implant on Tuesday, October 1st with Dr Sallyanne Kuster.  Please arrive at the [place] [facility] on the day of your procedure. I will call you with further instructions regarding the time of your procedure.  1. You may have a light, early breakfast the morning of your procedure. NOTHING TO EAT AFTER 8:00 AM. 2. Complete labwork the week before the procedure. There is a lab located within our office if we are convenient for you. You do not have to be fasting. 3. You may continue your current medications. 4. Plan for an overnight stay. 5. Bring your insurance cards and a list of your current medications. 6. Wash your chest and neck with the surgical scrub provided the evening before and the morning of your procedure. Rinse well. Instructions have been provided.  * Special note:  Every effort is made to have your procedure done on time.  Occasionally there are emergencies that present themselves at the hospital that may cause delays.  Please be patient if a delay does occur.  If you have ANY questions after you  get home, please call the office [phone].  Chelley, CMA Dr Sallyanne Kuster    Preparing for Surgery  Before surgery, you can play an important role. Because skin is not sterile, your skin needs to be as free of germs as possible. You can reduce the number of germs on your skin by washing with CHG (chlorhexidine gluconate) Soap before surgery. CHG is an antiseptic cleaner which kills germs and bonds with the skin to continue killing germs even after washing.  Please do not use if you have an allergy to CHG or antibacterial soaps. If your skin becomes reddened/irritated, STOP using the CHG.  DO NOT SHAVE (including legs and underarms) for at least 48 hours prior to first CHG shower. It is OK to shave your face.  Please follow these instructions carefully: 1. Shower the night before surgery and the morning of surgery with CHG Soap. 2. If you chose to wash your hair, wash your hair first as usual with your normal shampoo/conditioner. 3. After you shampoo/condition, rinse you hair and body thoroughly to remove shampoo/conditioner. 4. Use CHG as you would any other liquid soap. You can apply CHG directly to the skin and wash gently with a loofah or a clean washcloth. 5. Apply the CHG Soap to your body ONLY FROM THE NECK DOWN. Do not use on open wounds or open sores. Avoid contact with your eyes, ears, mouth, and genitals (private parts). Wash genitals (private part) with your normal soap. 6. Wash thoroughly, paying special attention to the area where your surgery will  be performed. 7. Thoroughly rinse your body with warm water from the neck down. 8. DO NOT shower/wash with your normal soap after using and rinsing off the CHG Soap. 9. Pat yourself dry with a clean towel. 10. Wear clean pajamas to bed. 11. Place clean sheets on your bed the night of your first shower and do not sleep with pets..  Day of Surgery: Shower with the CHG Soap following the instructions listed above. DO NOT apply  deodorants or lotions. Please wear clean clothes to the hospital/surgery center.

## 2018-06-06 NOTE — H&P (View-Only) (Signed)
Cardiology OfficeNote    Date:  06/08/2018   ID:  [name], DOB [birth year], MRN [id number]  PCP:  [name], [name], MD  Cardiologist:   Sanda Klein, MD    Chief Complaint  Patient presents with  . Follow-up    asymptomatic coronary calcifications  . Thoracic Aortic Aneurysm  . AAA    History of Present Illness:  Tony [name] is a 77 y.o. male with asymptomatic peripheral and coronary arterial calcification on imaging studies performed for workup of prostate cancer, AAA and recently incidentally diagnosed aneurysm of the ascending aorta on chest CT.  He was admitted with acute appendicitis and underwent urgent surgery on May 10.  He was then readmitted about 2 weeks later with gastric outlet obstruction and nausea and vomiting and underwent a chest CT.  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  He was discovered to have an ascending aortic aneurysm measured at 4.7 cm (although previous imaging studies had shown coronary and aortic atherosclerosis, they had not been high enough to evaluate the ascending aorta).  In August he underwent upper endoscopy and colonoscopy with benign findings.  In September 2017 his nuclear vasodilator stress test was a low risk study with a small/mild area of ischemia in the apex. EF 59%. He has not had angina pectoris.  He no longer smokes.  Activity is limited by gait instability and he uses a cane.  He denies exertional dyspnea.  He also denies syncope, palpitations, focal neurological events, chest pain, claudication.  He continues to complain of fatigue.  He remains relatively bradycardic with a heart rate of 55 bpm today.  On May 23 his EKG showed sinus rhythm at 81 bpm.  All his recent tracings have shown right bundle branch block, left anterior fascicular block and slightly prolonged PR interval at 200 ms or so.  He is now on second line chemotherapy for prostate cancer refractory to  antiandrogen therapy, .  Past Medical History:  Diagnosis Date  . Gastric outlet obstruction 02/03/2018   [name] 02/03/2018  . GERD (gastroesophageal reflux disease)   . History of hiatal hernia    small/notes 02/03/2018  . Hypertension    no meds  . Prostate cancer ([place]) 02/08/14   Gleason 4+5=9, PSA 15.65  . Radiation   . Sinus problem     Past Surgical History:  Procedure Laterality Date  . ESOPHAGOGASTRODUODENOSCOPY (EGD) WITH PROPOFOL N/A 02/03/2018   Procedure: ESOPHAGOGASTRODUODENOSCOPY (EGD) WITH PROPOFOL;  Surgeon: Clarene Essex, MD;  Location: [facility];  Service: Endoscopy;  Laterality: N/A;  . FRACTURE SURGERY    . LAPAROSCOPIC APPENDECTOMY N/A 01/21/2018   Procedure: APPENDECTOMY LAPAROSCOPIC;  Surgeon: Kinsinger, Arta Bruce, MD;  Location: [facility];  Service: General;  Laterality: N/A;  . PROSTATE BIOPSY  02/08/14   gleason 4+5=9, 12/12 cores positive, 54 gm  . RADIOACTIVE SEED IMPLANT N/A 06/29/2014   Procedure: RADIOACTIVE SEED IMPLANT;  Surgeon: Bernestine Amass, MD;  Location: [facility];  Service: Urology;  Laterality: N/A;  . WRIST FRACTURE SURGERY Right 1980s    Current Medications: Outpatient Medications Prior to Visit  Medication Sig Dispense Refill  . amLODipine (NORVASC) 2.5 MG tablet Take 1 tablet (2.5 mg total) by mouth daily. 90 tablet 2  . aspirin EC 81 MG tablet Take 81 mg by mouth daily.    . fluticasone (FLONASE) 50 MCG/ACT nasal spray Place 1 spray into both nostrils daily as needed for allergies or  rhinitis.    [name] LORazepam (ATIVAN) 1 MG tablet TAKE 1/2 TO 1 TABLET DAILY AS NEEDED 30 tablet 2  . Multiple Vitamin (MULTIVITAMIN WITH MINERALS) TABS tablet Take 1 tablet by mouth daily.    [name] omeprazole (PRILOSEC) 20 MG capsule     . predniSONE (DELTASONE) 5 MG tablet Take 1 tablet (5 mg total) by mouth daily with breakfast. 90 tablet 1  . traZODone (DESYREL) 50 MG tablet     . ZYTIGA 250 MG tablet TAKE 4 TABLETS (1000 MG) BY MOUTH DAILY.  TAKE ON AN EMPTY STOMACH 1 HOUR BEFORE OR 2 HOURS AFTER A MEAL 120 tablet 0  . potassium chloride SA (K-DUR,KLOR-CON) 20 MEQ tablet Take 1 tablet (20 mEq total) by mouth 2 (two) times daily. 60 tablet 0   No facility-administered medications prior to visit.      Allergies:   Penicillins   Social History   Socioeconomic History  . Marital status: Married    Spouse name: Not on file  . Number of children: Not on file  . Years of education: Not on file  . Highest education level: Not on file  Occupational History  . Not on file  Social Needs  . Financial resource strain: Not on file  . Food insecurity:    Worry: Not on file    Inability: Not on file  . Transportation needs:    Medical: Not on file    Non-medical: Not on file  Tobacco Use  . Smoking status: Former Smoker    Years: 60.00    Types: Cigars    Last attempt to quit: 09/14/2012    Years since quitting: 5.7  . Smokeless tobacco: Never Used  . Tobacco comment: little cigars; the small ones"  Substance and Sexual Activity  . Alcohol use: Yes    Alcohol/week: 14.0 standard drinks    Types: 14 Cans of beer per week  . Drug use: No  . Sexual activity: Not Currently  Lifestyle  . Physical activity:    Days per week: Not on file    Minutes per session: Not on file  . Stress: Not on file  Relationships  . Social connections:    Talks on phone: Not on file    Gets together: Not on file    Attends religious service: Not on file    Active member of club or organization: Not on file    Attends meetings of clubs or organizations: Not on file    Relationship status: Not on file  Other Topics Concern  . Not on file  Social History Narrative  . Not on file     Family History:  The patient's family history includes Alzheimer's disease in his father and mother; Arthritis in his sister; Cancer in his brother and father.   ROS:   Please see the history of present illness.    ROS All other systems reviewed and are  negative.   PHYSICAL EXAM:   VS:  BP 136/70 (BP Location: Left Arm, Patient Position: Sitting, Cuff Size: Normal)   Pulse (!) 55   Ht 5\' 7"  (1.702 m)   Wt 198 lb 3.2 oz (89.9 kg)   BMI 31.04 kg/m     General: Alert, oriented x3, no distress, mildly obese Head: no evidence of trauma, PERRL, EOMI, no exophtalmos or lid lag, no myxedema, no xanthelasma; normal ears, nose and oropharynx Neck: normal jugular venous pulsations and no hepatojugular reflux; brisk carotid pulses without delay and no carotid  bruits Chest: clear to auscultation, no signs of consolidation by percussion or palpation, normal fremitus, symmetrical and full respiratory excursions Cardiovascular: normal position and quality of the apical impulse, regular rhythm, normal first and widely split second heart sounds, no murmurs, rubs or gallops Abdomen: no tenderness or distention, no masses by palpation, no abnormal pulsatility or arterial bruits, normal bowel sounds, no hepatosplenomegaly Extremities: no clubbing, cyanosis or edema; 2+ radial, ulnar and brachial pulses bilaterally; 2+ right femoral, posterior tibial and dorsalis pedis pulses; 2+ left femoral, posterior tibial and dorsalis pedis pulses; no subclavian or femoral bruits Neurological: grossly nonfocal Psych: Normal mood and affect   Wt Readings from Last 3 Encounters:  06/06/18 198 lb 3.2 oz (89.9 kg)  05/23/18 198 lb 4 oz (89.9 kg)  04/27/18 200 lb (90.7 kg)      Studies/Labs Reviewed:   EKG:  EKG is not ordered today.  The ekg ordered 02/03/2018 demonstrates sinus rhythm, right bundle branch block, left anterior fascicular block, borderline PR interval 200 ms, QTC 428 ms  Recent Labs: 02/02/2018: B Natriuretic Peptide 226.5 02/18/2018: Magnesium 2.2 06/08/2018: ALT 14; BUN 16; Creatinine 1.00; Hemoglobin 16.2; Platelet Count 160; Potassium 4.6; Sodium 144   Lipid Panel    Component Value Date/Time   CHOL 200 10/19/2017 1039   TRIG 92.0 10/19/2017 1039    HDL 74.30 10/19/2017 1039   CHOLHDL 3 10/19/2017 1039   VLDL 18.4 10/19/2017 1039   LDLCALC 107 (H) 10/19/2017 1039   ASSESSMENT:    1. Sick sinus syndrome ([place])   2. Chronic fatigue   3. Trifascicular block   4. Coronary artery disease involving native coronary artery of native heart without angina pectoris   5. Thoracic aortic aneurysm without rupture ([place])   6. 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[place])   7. Hypercholesterolemia   8. Essential hypertension      PLAN:  In order of problems listed above:  1. Sinus bradycardia/SSS: Tony [name] has clear evidence of sinus node dysfunction.  He has complaints of chronic fatigue.  Even when he was acutely ill with appendicitis his heart rate peaked at only about 90 bpm.  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  I think his fatigue is at least partly related to this.  He would benefit from a rate response sensor implantation (preferably a device with minute ventilation sensor). This procedure has been fully reviewed with the patient and written informed consent has been obtained. 2. RBBB+LAFB+long PR: He has never experienced syncope or other overt findings to suggest high-grade AV block, but clearly has extensive infrahisian disease  3. CAD: Low risk stress test 2017 but with a small area of ischemia.  As long as he remains asymptomatic and has normal left ventricular systolic function.  Note that he is taking amlodipine (which might be functioning as an antianginal), but in a very low dose.  Invasive evaluation does not appear indicated. 4. Ascending aortic aneurysm: Moderate in size and asymptomatic.  Reevaluate with CT angiography in 1 year.  5. AAA: Unchanged on serial studies more than a year apart.  We will cancel the ultrasound duplex that was scheduled for next month and will reevaluate the abdominal aneurysm on the same CT angiogram used to evaluate his ascending  aneurysm.  He cannot take beta blockers due to bradycardia, we should consider this once his device is implanted..  6. HLP: Target LDL-C preferably less than 70, probably need to start a statin.  Needs an updated lipid profile.  Will discuss at the time of his pacemaker implantation.  Congratulated him on stopping smoking. 7. HTN: Good blood pressure control on low-dose amlodipine monotherapy.  At home his blood pressure is usually lower.   Medication Adjustments/Labs and Tests Ordered: Current medicines are reviewed at length with the patient today.  Concerns regarding medicines are outlined above.  Medication changes, Labs and Tests ordered today are listed in the Patient Instructions below. Patient Instructions  Cancel your AAA duplex scheduled for 07/06/18.  Your physician has requested that you have a CT of your aorta in MAY 2020. Non-Cardiac CT scanning, (CAT scanning), is a noninvasive, special x-ray that produces cross-sectional images of the body using x-rays and a computer. CT scans help physicians diagnose and treat medical conditions. For some CT exams, a contrast material is used to enhance visibility in the area of the body being studied. CT scans provide greater clarity and reveal more details than regular x-ray exams. This will be performed at our [facility] location. [address] [place] [address] [phone]    [facility] at [address], [place]  [place], [place] [address]  Phone: [phone] Fax: [phone]  You will be scheduled for a Permanent Transvenous Pacemaker Implant on Tuesday, October 1st with Dr Sallyanne Kuster.  Please arrive at the [facility] on the day of your procedure. I will call you with further instructions regarding the time of your procedure.  1. You may have a light, early breakfast the morning of your procedure. NOTHING TO EAT AFTER  8:00 AM. 2. Complete labwork the week before the procedure. There is a lab located within our office if we are convenient for you. You do not have to be fasting. 3. You may continue your current medications. 4. Plan for an overnight stay. 5. Bring your insurance cards and a list of your current medications. 6. Wash your chest and neck with the surgical scrub provided the evening before and the morning of your procedure. Rinse well. Instructions have been provided.  * Special note:  Every effort is made to have your procedure done on time.  Occasionally there are emergencies that present themselves at the hospital that may cause delays.  Please be patient if a delay does occur.  If you have ANY questions after you get home, please call the office [phone].  Chelley, CMA Dr Sallyanne Kuster    Preparing for Surgery  Before surgery, you can play an important role. Because skin is not sterile, your skin needs to be as free of germs as possible. You can reduce the number of germs on your skin by washing with CHG (chlorhexidine gluconate) Soap before surgery. CHG is an antiseptic cleaner which kills germs and bonds with the skin to continue killing germs even after washing.  Please do not use if you have an allergy to CHG or antibacterial soaps. If your skin becomes reddened/irritated, STOP using the CHG.  DO NOT SHAVE (including legs and underarms) for at least 48 hours prior to first CHG shower. It is OK to shave your face.  Please follow these instructions carefully: 1. Shower the night before surgery and the morning of surgery with CHG Soap. 2. If you chose to wash your hair, wash your hair first as usual with your normal shampoo/conditioner. 3. After you shampoo/condition, rinse you hair and body thoroughly to remove shampoo/conditioner.  4. Use CHG as you would any other liquid soap. You can apply CHG directly to the skin and wash gently with a loofah or a clean washcloth. 5. Apply the CHG  Soap to your body ONLY FROM THE NECK DOWN. Do not use on open wounds or open sores. Avoid contact with your eyes, ears, mouth, and genitals (private parts). Wash genitals (private part) with your normal soap. 6. Wash thoroughly, paying special attention to the area where your surgery will be performed. 7. Thoroughly rinse your body with warm water from the neck down. 8. DO NOT shower/wash with your normal soap after using and rinsing off the CHG Soap. 9. Pat yourself dry with a clean towel. 10. Wear clean pajamas to bed. 11. Place clean sheets on your bed the night of your first shower and do not sleep with pets..  Day of Surgery: Shower with the CHG Soap following the instructions listed above. DO NOT apply deodorants or lotions. Please wear clean clothes to the hospital/surgery center.      Signed, Sanda Klein, MD  06/08/2018 3:58 PM    [address], [place], [place]  [address] Phone: [phone]; Fax: [phone]

## 2018-06-07 NOTE — Telephone Encounter (Signed)
Left detailed message with instructions for medications.

## 2018-06-08 ENCOUNTER — Inpatient Hospital Stay: Payer: Medicare Other | Attending: Oncology

## 2018-06-08 ENCOUNTER — Encounter: Payer: Self-pay | Admitting: Cardiovascular Disease

## 2018-06-08 DIAGNOSIS — I495 Sick sinus syndrome: Secondary | ICD-10-CM | POA: Diagnosis not present

## 2018-06-08 DIAGNOSIS — D751 Secondary polycythemia: Secondary | ICD-10-CM | POA: Insufficient documentation

## 2018-06-08 DIAGNOSIS — I453 Trifascicular block: Secondary | ICD-10-CM | POA: Insufficient documentation

## 2018-06-08 DIAGNOSIS — E876 Hypokalemia: Secondary | ICD-10-CM | POA: Diagnosis not present

## 2018-06-08 DIAGNOSIS — F17210 Nicotine dependence, cigarettes, uncomplicated: Secondary | ICD-10-CM | POA: Insufficient documentation

## 2018-06-08 DIAGNOSIS — C61 Malignant neoplasm of prostate: Secondary | ICD-10-CM | POA: Diagnosis present

## 2018-06-08 DIAGNOSIS — Z5111 Encounter for antineoplastic chemotherapy: Secondary | ICD-10-CM | POA: Diagnosis not present

## 2018-06-08 DIAGNOSIS — I2510 Atherosclerotic heart disease of native coronary artery without angina pectoris: Secondary | ICD-10-CM | POA: Insufficient documentation

## 2018-06-08 DIAGNOSIS — I10 Essential (primary) hypertension: Secondary | ICD-10-CM | POA: Diagnosis not present

## 2018-06-08 LAB — CMP (CANCER CENTER ONLY)
ALBUMIN: 4.3 g/dL (ref 3.5–5.0)
ALT: 14 U/L (ref 0–44)
ANION GAP: 14 (ref 5–15)
AST: 17 U/L (ref 15–41)
Alkaline Phosphatase: 92 U/L (ref 38–126)
BUN: 16 mg/dL (ref 8–23)
CHLORIDE: 104 mmol/L (ref 98–111)
CO2: 26 mmol/L (ref 22–32)
Calcium: 9.9 mg/dL (ref 8.9–10.3)
Creatinine: 1 mg/dL (ref 0.61–1.24)
GFR, Est AFR Am: >60 mL/min (ref >60)
Glucose, Bld: 114 mg/dL — ABNORMAL HIGH (ref 70–99)
POTASSIUM: 4.6 mmol/L (ref 3.5–5.1)
Sodium: 144 mmol/L (ref 135–145)
Total Bilirubin: 0.9 mg/dL (ref 0.3–1.2)
Total Protein: 7.4 g/dL (ref 6.5–8.1)

## 2018-06-08 LAB — CBC WITH DIFFERENTIAL (CANCER CENTER ONLY)
BASOS PCT: 1 %
Basophils Absolute: 0.1 10*3/uL (ref 0.0–0.1)
EOS PCT: 2 %
Eosinophils Absolute: 0.1 10*3/uL (ref 0.0–0.5)
HCT: 48.2 % (ref 38.4–49.9)
Hemoglobin: 16.2 g/dL (ref 13.0–17.1)
Lymphocytes Relative: 15 %
Lymphs Abs: 1 10*3/uL (ref 0.9–3.3)
MCH: 30.7 pg (ref 27.2–33.4)
MCHC: 33.6 g/dL (ref 32.0–36.0)
MCV: 91.5 fL (ref 79.3–98.0)
MONO ABS: 0.5 10*3/uL (ref 0.1–0.9)
Monocytes Relative: 7 %
Neutro Abs: 5.3 10*3/uL (ref 1.5–6.5)
Neutrophils Relative %: 75 %
PLATELETS: 160 10*3/uL (ref 140–400)
RBC: 5.27 MIL/uL (ref 4.20–5.82)
RDW: 13.5 % (ref 11.0–14.6)
WBC Count: 7 10*3/uL (ref 4.0–10.3)

## 2018-06-08 MED FILL — ZYTIGA 250 MG TABLET: 250 | 30 days supply | Qty: 120 | Fill #0

## 2018-06-09 LAB — PROTIME-INR
INR: 1 (ref 0.8–1.2)
Prothrombin Time: 10.7 s (ref 9.1–12.0)

## 2018-06-09 LAB — PROSTATE-SPECIFIC AG, SERUM (LABCORP)

## 2018-06-10 ENCOUNTER — Inpatient Hospital Stay: Payer: Medicare Other

## 2018-06-10 ENCOUNTER — Inpatient Hospital Stay: Payer: Medicare Other | Admitting: Oncology

## 2018-06-10 VITALS — BP 144/94 | HR 76 | Temp 98.3°F | Resp 17 | Ht 67.0 in | Wt 198.3 lb

## 2018-06-10 DIAGNOSIS — I10 Essential (primary) hypertension: Secondary | ICD-10-CM | POA: Diagnosis not present

## 2018-06-10 DIAGNOSIS — F17210 Nicotine dependence, cigarettes, uncomplicated: Secondary | ICD-10-CM

## 2018-06-10 DIAGNOSIS — D751 Secondary polycythemia: Secondary | ICD-10-CM | POA: Diagnosis not present

## 2018-06-10 DIAGNOSIS — Z5111 Encounter for antineoplastic chemotherapy: Secondary | ICD-10-CM | POA: Diagnosis not present

## 2018-06-10 DIAGNOSIS — C61 Malignant neoplasm of prostate: Secondary | ICD-10-CM | POA: Diagnosis not present

## 2018-06-10 DIAGNOSIS — E876 Hypokalemia: Secondary | ICD-10-CM

## 2018-06-10 MED ORDER — LEUPROLIDE ACETATE (3 MONTH) 22.5 MG IM KIT
22.5000 mg | PACK | Freq: Once | INTRAMUSCULAR | Status: AC
  Administered 2018-06-10: 22.5 mg via INTRAMUSCULAR

## 2018-06-10 NOTE — Progress Notes (Signed)
Hematology and Oncology Follow Up Visit  Tony [name] [id number] Jul 28, 1941 77 y.o. 06/10/2018 8:41 AM Tony, Betty [name], [name], [name], MD   Principle Diagnosis: 77 year old man with prostate cancer diagnosed in 2015 with a Gleason score 4+5 = 9 and a PSA 15.7.  He developed castration-resistant disease in 2017.  Prior Therapy:  He is status post radiation therapy for external beam radiation completed in September 2015 for a total of 45 gray. He also status post seed implant brachytherapy boost completed in October 2015.   He received total of 1 year of androgen deprivation 22,015 and 2016. His PSA nadir was to 0.47 in November 2016. His PSA was 4.08 in May 2017.  Current therapy:  Androgen deprivation in the form of Lupron 22.5 mg resumed on 03/02/2016. This will be given every 3 months.  Zytiga 1000 mg with prednisone started in August 2017.   Interim History: [name] is here for a follow-up visit.  Since her last visit, he reports no major changes in his health.  He was diagnosed with sinus bradycardia and will require pacemaker placement in the near future.  He has reported some fatigue and tiredness could be related to that.  He remains on Zytiga without any major complications.  He denies any lower extremity edema or bone pain.  He remains active and attends to activities of daily living.   He does not report any headaches blurred vision or double vision.  He denies any dizziness or lethargy.Tony [name]  He does not report any fevers, chills or sweats. He does not report any chest pain shortness of breath cough or hemoptysis. Does not report any palpitation orthopnea or PND.  He does not report any nausea, vomiting.  He denies any changes in his bowel habits.  He does not report any paralysis or myalgias.  He does not report any skin rashes or lymphadenopathy.  He does not report any petechiae or ecchymosis.  He denies any skin rashes or lesions.  Rest of his review of systems is  negative.  Medications: I have reviewed the patient's current medications.  Current Outpatient Medications  Medication Sig Dispense Refill  . acetaminophen (TYLENOL) 500 MG tablet Take 500 mg by mouth every 6 (six) hours as needed (for pain).    Tony [name] amLODipine (NORVASC) 2.5 MG tablet Take 1 tablet (2.5 mg total) by mouth daily. 90 tablet 2  . aspirin EC 81 MG tablet Take 81 mg by mouth daily.    . carbamide peroxide (DEBROX) 6.5 % OTIC solution Place 5 drops into both ears daily as needed (for earwax).    . fluticasone (FLONASE) 50 MCG/ACT nasal spray Place 1 spray into both nostrils daily as needed for allergies or rhinitis.    Tony [name] LORazepam (ATIVAN) 1 MG tablet TAKE 1/2 TO 1 TABLET DAILY AS NEEDED (Patient taking differently: Take 0.5 mg by mouth 2 (two) times daily. ) 30 tablet 2  . Multiple Vitamin (MULTIVITAMIN WITH MINERALS) TABS tablet Take 0.5 tablets by mouth every other day. Centrum    . omeprazole (PRILOSEC) 20 MG capsule Take 20 mg by mouth daily before breakfast.     . polyethylene glycol powder (GLYCOLAX/MIRALAX) powder Take 17 [name] by mouth daily as needed (constipation.).    Tony [name] potassium chloride SA (K-DUR,KLOR-CON) 20 MEQ tablet Take 1 tablet (20 mEq total) by mouth daily. 90 tablet 0  . predniSONE (DELTASONE) 5 MG tablet Take 1 tablet (5 mg total) by mouth daily with breakfast. 90 tablet 1  .  traZODone (DESYREL) 50 MG tablet     . ZYTIGA 250 MG tablet TAKE 4 TABLETS (1000 MG) BY MOUTH DAILY. TAKE ON AN EMPTY STOMACH 1 HOUR BEFORE OR 2 HOURS AFTER A MEAL (Patient taking differently: Take 1,000 mg by mouth daily. ) 120 tablet 0   No current facility-administered medications for this visit.      Allergies:  Allergies  Allergen Reactions  . Penicillins Rash    Has patient had a PCN reaction causing immediate rash, facial/tongue/throat swelling, SOB or lightheadedness with hypotension: Yes Has patient had a PCN reaction causing severe rash involving mucus membranes or skin necrosis:  No Has patient had a PCN reaction that required hospitalization: No Has patient had a PCN reaction occurring within the last 10 years: No If all of the above answers are "NO", then may proceed with Cephalosporin use.    Past Medical History, Surgical history, Social history, and Family History were reviewed and updated.   Physical Exam:   Blood pressure (!) 144/94, pulse 76, temperature 98.3 F (36.8 C), temperature source Oral, resp. rate 17, height 5\' 7"  (1.702 m), weight 198 lb 4.8 oz (89.9 kg), SpO2 98 %.   ECOG: 1   General appearance: Comfortable appearing without any discomfort Head: Normocephalic without any trauma Oropharynx: Mucous membranes are moist and pink without any thrush or ulcers. Eyes: Pupils are equal and round reactive to light. Lymph nodes: No cervical, supraclavicular, inguinal or axillary lymphadenopathy.   Heart:regular rate and rhythm.  S1 and S2 without leg edema. Lung: Clear without any rhonchi or wheezes.  No dullness to percussion. Abdomin: Soft, nontender, nondistended with good bowel sounds.  No hepatosplenomegaly. Musculoskeletal: No joint deformity or effusion.  Full range of motion noted. Neurological: No deficits noted on motor, sensory and deep tendon reflex exam. Skin: No petechial rash or dryness.  Appeared moist.     Lab Results: Lab Results  Component Value Date   WBC 7.0 06/08/2018   HGB 16.2 06/08/2018   HCT 48.2 06/08/2018   MCV 91.5 06/08/2018   PLT 160 06/08/2018     Chemistry      Component Value Date/Time   NA 144 06/08/2018 0740   NA 141 08/27/2017 0915   K 4.6 06/08/2018 0740   K 4.8 08/27/2017 0915   CL 104 06/08/2018 0740   CO2 26 06/08/2018 0740   CO2 26 08/27/2017 0915   BUN 16 06/08/2018 0740   BUN 16.2 08/27/2017 0915   CREATININE 1.00 06/08/2018 0740   CREATININE 0.9 08/27/2017 0915   GLU 105 01/10/2016      Component Value Date/Time   CALCIUM 9.9 06/08/2018 0740   CALCIUM 9.8 08/27/2017 0915    ALKPHOS 92 06/08/2018 0740   ALKPHOS 83 08/27/2017 0915   AST 17 06/08/2018 0740   AST 23 08/27/2017 0915   ALT 14 06/08/2018 0740   ALT 17 08/27/2017 0915   BILITOT 0.9 06/08/2018 0740   BILITOT 0.95 08/27/2017 0915      Results for MECHEL, SCHUTTER (MRN [id number]) as of 06/10/2018 08:43  Ref. Range 11/30/2017 07:47 03/02/2018 08:00 06/08/2018 07:40  Prostate Specific Ag, Serum Latest Ref Range: 0.0 - 4.0 ng/mL <0.1 <0.1 <0.1      Impression and Plan:   77 year old gentleman with the following issues:  1.  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   He is currently on Zytiga without any major complications.  His PSA remains undetectable at this time without any complications  related to this medication.  Risks and benefits of this medication long-term was reviewed which include adrenal insufficiency, weight gain, hypertension among others.  After discussion he is agreeable to continue.   2. Androgen depravation: He continues to receive Lupron every 3 months without any complications.  Risks and benefits of long-term androgen deprivation was reviewed today and is agreeable to continue.  He will receive Lupron today.  3. Polycythemia: His hemoglobin mildly elevated and related to secondary causes of smoking.  After discontinuation of smoking his hemoglobin of been close to normal range.  4. Hypokalemia: Potassium remains in normal range at this time.  No supplements as needed on Zytiga.  5. Hypertension: Blood pressure remains manageable at this time on Zytiga.  6.  Prognosis and goals of care: His disease is incurable but has been under excellent control at this time.  Aggressive therapy is warranted.  7. Follow-up: Will be in 3 months.  15  minutes was spent with the patient face-to-face today.  More than 50% of time was dedicated to reviewing the natural course of his disease, treatment options and coordinating plan of  care.    Zola Button, MD 9/27/20198:41 AM

## 2018-06-14 ENCOUNTER — Encounter (HOSPITAL_COMMUNITY)
Admission: RE | Disposition: A | Discharge status: Home / Self Care | Payer: Self-pay | Source: Ambulatory Visit | Attending: Cardiovascular Disease

## 2018-06-14 ENCOUNTER — Ambulatory Visit (HOSPITAL_COMMUNITY)
Admission: RE | Admit: 2018-06-14 | Discharge: 2018-06-15 | Disposition: A | Discharge status: Home / Self Care | Payer: Medicare Other | Source: Ambulatory Visit | Attending: Cardiovascular Disease | Admitting: Cardiovascular Disease

## 2018-06-14 ENCOUNTER — Other Ambulatory Visit: Payer: Self-pay

## 2018-06-14 DIAGNOSIS — E7800 Pure hypercholesterolemia, unspecified: Secondary | ICD-10-CM | POA: Insufficient documentation

## 2018-06-14 DIAGNOSIS — I714 Abdominal aortic aneurysm, without rupture: Secondary | ICD-10-CM | POA: Diagnosis not present

## 2018-06-14 DIAGNOSIS — I10 Essential (primary) hypertension: Secondary | ICD-10-CM | POA: Insufficient documentation

## 2018-06-14 DIAGNOSIS — Z87891 Personal history of nicotine dependence: Secondary | ICD-10-CM | POA: Insufficient documentation

## 2018-06-14 DIAGNOSIS — Z880 Allergy status to penicillin: Secondary | ICD-10-CM | POA: Insufficient documentation

## 2018-06-14 DIAGNOSIS — I712 Thoracic aortic aneurysm, without rupture: Secondary | ICD-10-CM | POA: Diagnosis not present

## 2018-06-14 DIAGNOSIS — Z9889 Other specified postprocedural states: Secondary | ICD-10-CM | POA: Diagnosis not present

## 2018-06-14 DIAGNOSIS — C61 Malignant neoplasm of prostate: Secondary | ICD-10-CM | POA: Insufficient documentation

## 2018-06-14 DIAGNOSIS — Z79899 Other long term (current) drug therapy: Secondary | ICD-10-CM | POA: Insufficient documentation

## 2018-06-14 DIAGNOSIS — Z7982 Long term (current) use of aspirin: Secondary | ICD-10-CM | POA: Insufficient documentation

## 2018-06-14 DIAGNOSIS — Z809 Family history of malignant neoplasm, unspecified: Secondary | ICD-10-CM | POA: Insufficient documentation

## 2018-06-14 DIAGNOSIS — I453 Trifascicular block: Secondary | ICD-10-CM | POA: Insufficient documentation

## 2018-06-14 DIAGNOSIS — I2510 Atherosclerotic heart disease of native coronary artery without angina pectoris: Secondary | ICD-10-CM | POA: Insufficient documentation

## 2018-06-14 DIAGNOSIS — K219 Gastro-esophageal reflux disease without esophagitis: Secondary | ICD-10-CM | POA: Diagnosis not present

## 2018-06-14 DIAGNOSIS — Z8719 Personal history of other diseases of the digestive system: Secondary | ICD-10-CM | POA: Insufficient documentation

## 2018-06-14 DIAGNOSIS — R5382 Chronic fatigue, unspecified: Secondary | ICD-10-CM | POA: Insufficient documentation

## 2018-06-14 DIAGNOSIS — I4510 Unspecified right bundle-branch block: Secondary | ICD-10-CM | POA: Insufficient documentation

## 2018-06-14 DIAGNOSIS — I495 Sick sinus syndrome: Secondary | ICD-10-CM | POA: Diagnosis present

## 2018-06-14 DIAGNOSIS — Z950 Presence of cardiac pacemaker: Secondary | ICD-10-CM

## 2018-06-14 HISTORY — PX: INSERT / REPLACE / REMOVE PACEMAKER: SUR710

## 2018-06-14 HISTORY — PX: PACEMAKER IMPLANT: EP1218

## 2018-06-14 HISTORY — DX: Sick sinus syndrome: I49.5

## 2018-06-14 HISTORY — DX: Presence of cardiac pacemaker: Z95.0

## 2018-06-14 LAB — SURGICAL PCR SCREEN
MRSA, PCR: NEGATIVE
STAPHYLOCOCCUS AUREUS: NEGATIVE

## 2018-06-14 SURGERY — PACEMAKER IMPLANT

## 2018-06-14 MED ORDER — LIDOCAINE HCL (PF) 1 % IJ SOLN
INTRAMUSCULAR | Status: DC | PRN
  Administered 2018-06-14: 50 mL via INTRADERMAL

## 2018-06-14 MED ORDER — ABIRATERONE ACETATE 250 MG PO TABS: 1000.0000 mg | ORAL_TABLET | Freq: Every day | ORAL | Status: DC

## 2018-06-14 MED ORDER — ASPIRIN EC 81 MG PO TBEC
81.0000 mg | DELAYED_RELEASE_TABLET | Freq: Every day | ORAL | Status: DC
  Administered 2018-06-15: 81 mg via ORAL
  Filled 2018-06-14: qty 1

## 2018-06-14 MED ORDER — SODIUM CHLORIDE 0.9 % IV SOLN
INTRAVENOUS | Status: DC | PRN
  Administered 2018-06-14: 500 mL

## 2018-06-14 MED ORDER — HEPARIN (PORCINE) IN NACL 1000-0.9 UT/500ML-% IV SOLN
INTRAVENOUS | Status: DC | PRN
  Administered 2018-06-14: 500 mL

## 2018-06-14 MED ORDER — HEPARIN (PORCINE) IN NACL 1000-0.9 UT/500ML-% IV SOLN
INTRAVENOUS | Status: AC
  Filled 2018-06-14: qty 500

## 2018-06-14 MED ORDER — TRAZODONE HCL 50 MG PO TABS: 50.0000 mg | ORAL_TABLET | Freq: Every day | ORAL | Status: DC

## 2018-06-14 MED ORDER — ONDANSETRON HCL 4 MG/2ML IJ SOLN: 4.0000 mg | Freq: Four times a day (QID) | INTRAMUSCULAR | Status: DC | PRN

## 2018-06-14 MED ORDER — SODIUM CHLORIDE 0.9 % IV SOLN
INTRAVENOUS | Status: DC
  Administered 2018-06-14: 14:00:00 via INTRAVENOUS

## 2018-06-14 MED ORDER — MIDAZOLAM HCL 5 MG/5ML IJ SOLN
INTRAMUSCULAR | Status: DC | PRN
  Administered 2018-06-14 (×2): 1 mg via INTRAVENOUS

## 2018-06-14 MED ORDER — AMLODIPINE BESYLATE 2.5 MG PO TABS
2.5000 mg | ORAL_TABLET | Freq: Every day | ORAL | Status: DC
  Administered 2018-06-15: 09:00:00 2.5 mg via ORAL
  Filled 2018-06-14: qty 1

## 2018-06-14 MED ORDER — FENTANYL CITRATE (PF) 100 MCG/2ML IJ SOLN
INTRAMUSCULAR | Status: AC
  Filled 2018-06-14: qty 2

## 2018-06-14 MED ORDER — MUPIROCIN 2 % EX OINT
TOPICAL_OINTMENT | CUTANEOUS | Status: AC
  Administered 2018-06-14: 14:00:00
  Filled 2018-06-14: qty 22

## 2018-06-14 MED ORDER — PREDNISONE 5 MG PO TABS
5.0000 mg | ORAL_TABLET | Freq: Every day | ORAL | Status: DC
  Administered 2018-06-15: 5 mg via ORAL
  Filled 2018-06-14: qty 1

## 2018-06-14 MED ORDER — LIDOCAINE HCL (PF) 1 % IJ SOLN
INTRAMUSCULAR | Status: AC
  Filled 2018-06-14: qty 60

## 2018-06-14 MED ORDER — ACETAMINOPHEN 500 MG PO TABS: 500.0000 mg | ORAL_TABLET | Freq: Four times a day (QID) | ORAL | Status: DC | PRN

## 2018-06-14 MED ORDER — VANCOMYCIN HCL IN DEXTROSE 1-5 GM/200ML-% IV SOLN
1000.0000 mg | INTRAVENOUS | Status: AC
  Administered 2018-06-14: 1000 mg via INTRAVENOUS

## 2018-06-14 MED ORDER — LORAZEPAM 0.5 MG PO TABS: 0.5000 mg | ORAL_TABLET | Freq: Four times a day (QID) | ORAL | Status: DC | PRN

## 2018-06-14 MED ORDER — SODIUM CHLORIDE 0.9 % IV SOLN
INTRAVENOUS | Status: AC
  Filled 2018-06-14: qty 2

## 2018-06-14 MED ORDER — ACETAMINOPHEN 325 MG PO TABS: 325.0000 mg | ORAL_TABLET | ORAL | Status: DC | PRN

## 2018-06-14 MED ORDER — VANCOMYCIN HCL IN DEXTROSE 1-5 GM/200ML-% IV SOLN
INTRAVENOUS | Status: AC
  Filled 2018-06-14: qty 200

## 2018-06-14 MED ORDER — FENTANYL CITRATE (PF) 100 MCG/2ML IJ SOLN
INTRAMUSCULAR | Status: DC | PRN
  Administered 2018-06-14 (×2): 25 ug via INTRAVENOUS

## 2018-06-14 MED ORDER — CARVEDILOL 6.25 MG PO TABS
6.2500 mg | ORAL_TABLET | Freq: Two times a day (BID) | ORAL | Status: DC
  Administered 2018-06-14 – 2018-06-15 (×2): 6.25 mg via ORAL
  Filled 2018-06-14 (×2): qty 1
  Filled 2018-06-14: qty 2

## 2018-06-14 MED ORDER — MIDAZOLAM HCL 5 MG/5ML IJ SOLN
INTRAMUSCULAR | Status: AC
  Filled 2018-06-14: qty 5

## 2018-06-14 MED ORDER — SODIUM CHLORIDE 0.9 % IV SOLN: 80.0000 mg | INTRAVENOUS | Status: DC

## 2018-06-14 MED ORDER — CHLORHEXIDINE GLUCONATE 4 % EX LIQD: 60.0000 mL | Freq: Once | CUTANEOUS | Status: DC

## 2018-06-14 MED ORDER — MUPIROCIN 2 % EX OINT: 1.0000 "application " | TOPICAL_OINTMENT | Freq: Once | CUTANEOUS | Status: DC

## 2018-06-14 MED ORDER — VANCOMYCIN HCL IN DEXTROSE 1-5 GM/200ML-% IV SOLN
1000.0000 mg | Freq: Two times a day (BID) | INTRAVENOUS | Status: AC
  Administered 2018-06-15: 1000 mg via INTRAVENOUS
  Filled 2018-06-14: qty 200

## 2018-06-14 SURGICAL SUPPLY — 8 items
CABLE SURGICAL S-101-97-12 (CABLE) ×3 IMPLANT
IPG PACE AZUR XT DR MRI W1DR01 (Pacemaker) ×1 IMPLANT
LEAD CAPSURE NOVUS 5076-52CM (Lead) ×3 IMPLANT
LEAD CAPSURE NOVUS 5076-58CM (Lead) ×3 IMPLANT
PACE AZURE XT DR MRI W1DR01 (Pacemaker) ×3 IMPLANT
PAD PRO RADIOLUCENT 2001M-C (PAD) ×3 IMPLANT
SHEATH CLASSIC 7F (SHEATH) ×6 IMPLANT
TRAY PACEMAKER INSERTION (PACKS) ×3 IMPLANT

## 2018-06-14 NOTE — Interval H&P Note (Signed)
History and Physical Interval Note:  06/14/2018 1:51 PM  Tony [name]  has presented today for surgery, with the diagnosis of symptomatic bradycardia.  The various methods of treatment have been discussed with the patient and family. After consideration of risks, benefits and other options for treatment, the patient has consented to  Procedure(s): PACEMAKER IMPLANT - Dual Chamber (N/A) as a surgical intervention .  The patient's history has been reviewed, patient examined, no change in status, stable for surgery.  I have reviewed the patient's chart and labs.  Questions were answered to the patient's satisfaction.     Audi Conover

## 2018-06-14 NOTE — Op Note (Signed)
Procedure report  Procedure performed:  1. Implantation of new dual chamber permanent pacemaker 2. Fluoroscopy 3. Light sedation  Reason for procedure: Symptomatic bradycardia due to: Sinus node dysfunction Bradycardia due to necessary medications Procedure performed by: Sanda Klein, MD Complications: None Estimated blood loss: <10 mL Medications administered during procedure: Vancomycin 1 g intravenously Lidocaine 1% 30 mL locally,  Fentanyl 50 mcg intravenously Versed 2 mg intravenously During this procedure the patient is administered a total of Versed 2 mg and Fentanyl 50 mcg to achieve and maintain moderate conscious sedation.  The patient's heart rate, blood pressure, and oxygen saturation are monitored continuously during the procedure. The period of conscious sedation is 16 minutes, of which I was present face-to-face 100% of this time.   Device details: Generator Medtronic Azure XT DR MRI  model [id number] serial number [id number] H Right atrial lead Med[employer]onic [id number] serial number [id number] Right ventricular lead Med[employer]onic [id number] serial number [id number]  Procedure details:  After the risks and benefits of the procedure were discussed the patient provided informed consent and was brought to the cardiac cath lab in the fasting state. The patient was prepped and draped in usual sterile fashion. Local anesthesia with 1% lidocaine was administered to to the left infraclavicular area. A 5-6 cm horizontal incision was made parallel with and 2-3 cm caudal to the left clavicle. Using electrocautery and blunt dissection a prepectoral pocket was created down to the level of the pectoralis major muscle fascia. The pocket was carefully inspected for hemostasis. An antibiotic-soaked sponge was placed in the pocket.  Under fluoroscopic guidance and using the modified Seldinger technique 2 separate venipunctures were performed to access the left subclavian vein. No difficulty was  encountered accessing the vein.  Two J-tip guidewires were subsequently exchanged for 7 French safe sheaths.  Under fluoroscopic guidance the ventricular lead was advanced to level of the mid to apical right ventricular septum and thet active-fixation helix was deployed. Prominent current of injury was seen. Satisfactory pacing and sensing parameters were recorded. There was no evidence of diaphragmatic stimulation at maximum device output. The safe sheath was peeled away and the lead was secured in place with 2-0 silk.  In similar fashion the right atrial lead was advanced to the level of the atrial appendage. The active-fixation helix was deployed. There was prominent current of injury. Satisfactory  pacing and sensing parameters were recorded. There was no evidence of diaphragmatic stimulation with pacing at maximum device output. The safe sheath was peeled away and the lead was secured in place with 2-0 silk.  The antibiotic-soaked sponge was removed from the pocket. The pocket was flushed with copious amounts of antibiotic solution. Reinspection showed excellent hemostasis..  The ventricular lead was connected to the generator and appropriate ventricular pacing was seen. Subsequently the atrial lead was also connected. Repeat testing of the lead parameters later showed excellent values.  The entire system was then carefully inserted in the pocket with care been taking that the leads and device assumed a comfortable position without pressure on the incision. Great care was taken that the leads be located deep to the generator. The pocket was then closed in layers using 2 layers of 2-0 Vicryl and cutaneous staples, after which a sterile dressing was applied.  At the end of the procedure the following lead parameters were encountered:  Right atrial lead  sensed P waves 2.9, impedance 608ohms, threshold 0.75 V at 0.4 ms pulse width.  Right ventricular lead sensed R waves 5.8 mV, impedance 722ohms,  threshold 0.75 V at 0.4 ms pulse width.   Sanda Klein, MD, [facility] [phone] office [phone] pager

## 2018-06-14 NOTE — Addendum Note (Signed)
Addended by: Therisa Doyne on: 06/14/2018 12:08 PM   Modules accepted: Orders

## 2018-06-15 ENCOUNTER — Encounter (HOSPITAL_COMMUNITY): Payer: Self-pay | Admitting: Cardiovascular Disease

## 2018-06-15 ENCOUNTER — Ambulatory Visit (HOSPITAL_COMMUNITY): Payer: Medicare Other

## 2018-06-15 ENCOUNTER — Other Ambulatory Visit: Payer: Self-pay

## 2018-06-15 DIAGNOSIS — Z809 Family history of malignant neoplasm, unspecified: Secondary | ICD-10-CM | POA: Diagnosis not present

## 2018-06-15 DIAGNOSIS — R5382 Chronic fatigue, unspecified: Secondary | ICD-10-CM | POA: Diagnosis not present

## 2018-06-15 DIAGNOSIS — E7800 Pure hypercholesterolemia, unspecified: Secondary | ICD-10-CM | POA: Diagnosis not present

## 2018-06-15 DIAGNOSIS — Z880 Allergy status to penicillin: Secondary | ICD-10-CM | POA: Diagnosis not present

## 2018-06-15 DIAGNOSIS — I10 Essential (primary) hypertension: Secondary | ICD-10-CM | POA: Diagnosis not present

## 2018-06-15 DIAGNOSIS — I2510 Atherosclerotic heart disease of native coronary artery without angina pectoris: Secondary | ICD-10-CM | POA: Diagnosis not present

## 2018-06-15 DIAGNOSIS — Z9889 Other specified postprocedural states: Secondary | ICD-10-CM | POA: Diagnosis not present

## 2018-06-15 DIAGNOSIS — I4510 Unspecified right bundle-branch block: Secondary | ICD-10-CM | POA: Diagnosis not present

## 2018-06-15 DIAGNOSIS — Z87891 Personal history of nicotine dependence: Secondary | ICD-10-CM | POA: Diagnosis not present

## 2018-06-15 DIAGNOSIS — Z79899 Other long term (current) drug therapy: Secondary | ICD-10-CM | POA: Diagnosis not present

## 2018-06-15 DIAGNOSIS — I453 Trifascicular block: Secondary | ICD-10-CM | POA: Diagnosis not present

## 2018-06-15 DIAGNOSIS — I495 Sick sinus syndrome: Secondary | ICD-10-CM | POA: Diagnosis not present

## 2018-06-15 DIAGNOSIS — I714 Abdominal aortic aneurysm, without rupture: Secondary | ICD-10-CM | POA: Diagnosis not present

## 2018-06-15 DIAGNOSIS — Z950 Presence of cardiac pacemaker: Secondary | ICD-10-CM | POA: Diagnosis not present

## 2018-06-15 DIAGNOSIS — K219 Gastro-esophageal reflux disease without esophagitis: Secondary | ICD-10-CM | POA: Diagnosis not present

## 2018-06-15 DIAGNOSIS — Z7982 Long term (current) use of aspirin: Secondary | ICD-10-CM | POA: Diagnosis not present

## 2018-06-15 DIAGNOSIS — C61 Malignant neoplasm of prostate: Secondary | ICD-10-CM | POA: Diagnosis not present

## 2018-06-15 DIAGNOSIS — Z8719 Personal history of other diseases of the digestive system: Secondary | ICD-10-CM | POA: Diagnosis not present

## 2018-06-15 DIAGNOSIS — I712 Thoracic aortic aneurysm, without rupture: Secondary | ICD-10-CM | POA: Diagnosis not present

## 2018-06-15 LAB — LIPID PANEL
CHOL/HDL RATIO: 3.7 ratio
Cholesterol: 194 mg/dL (ref 0–200)
HDL: 53 mg/dL (ref >40)
LDL CALC: 125 mg/dL — AB (ref 0–99)
TRIGLYCERIDES: 82 mg/dL (ref <150)
VLDL: 16 mg/dL (ref 0–40)

## 2018-06-15 MED ORDER — CARVEDILOL 6.25 MG PO TABS: 6.2500 mg | ORAL_TABLET | Freq: Two times a day (BID) | ORAL | 4 refills | Status: DC

## 2018-06-15 MED ORDER — YOU HAVE A PACEMAKER BOOK
Freq: Once | Status: AC
  Administered 2018-06-15: 02:00:00
  Filled 2018-06-15: qty 1

## 2018-06-15 MED ORDER — SODIUM CHLORIDE 0.9 % IV SOLN
INTRAVENOUS | Status: DC | PRN
  Administered 2018-06-15: 02:00:00 250 mL via INTRAVENOUS

## 2018-06-15 NOTE — Discharge Summary (Signed)
Discharge Summary    Patient ID: Tony [name] MRN: [id number]; DOB: 09-Apr-1941  Admit date: 06/14/2018 Discharge date: 06/15/2018 Primary Care Provider: [name], [name], MD  Primary Cardiologist: Sanda Klein, MD   Discharge Diagnoses    Principal Problem:   Sick sinus syndrome [facility]) Active Problems:   SSS (sick sinus syndrome) ([place])  Allergies Allergies  Allergen Reactions  . Penicillins Rash    Has patient had a PCN reaction causing immediate rash, facial/tongue/throat swelling, SOB or lightheadedness with hypotension: Yes Has patient had a PCN reaction causing severe rash involving mucus membranes or skin necrosis: No Has patient had a PCN reaction that required hospitalization: No Has patient had a PCN reaction occurring within the last 10 years: No If all of the above answers are "NO", then may proceed with Cephalosporin use.   Diagnostic Studies/Procedures    Dual chamber PPM placement 06/14/18:  1. Implantation of new dual chamber permanent pacemaker 2. Fluoroscopy 3. Light sedation  Reason for procedure: Symptomatic bradycardia due to: Sinus node dysfunction Bradycardia due to necessary medications Procedure performed by: Sanda Klein, MD Complications: None Estimated blood loss: <10 mL Medications administered during procedure: Vancomycin 1 g intravenously Lidocaine 1% 30 mL locally,  Fentanyl 50 mcg intravenously Versed 2 mg intravenously During this procedure the patient is administered a total of Versed 2 mg and Fentanyl 50 mcg to achieve and maintain moderate conscious sedation.  The patient's heart rate, blood pressure, and oxygen saturation are monitored continuously during the procedure. The period of conscious sedation is 16 minutes, of which I was present face-to-face 100% of this time.  Device details: Generator Medtronic [place] DR MRI  model [id number] serial number [id number] H Right atrial lead Medtronic [id number] serial number  [id number] Right ventricular lead Medtronic [phone] serial number [id number] _____________   History of Present Illness     Tony [name] is a 77 y.o. male with asymptomatic peripheral and coronary arterial calcification on imaging studies performed for workup of prostate cancer, AAA and recently incidentally diagnosed aneurysm of the ascending aorta on chest CT.  He was admitted with acute appendicitis and underwent urgent surgery on May 10.  He was then readmitted about 2 weeks later with gastric outlet obstruction and nausea and vomiting and underwent a chest CT. 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  He was discovered to have an ascending aortic aneurysm measured at 4.7 cm (although previous imaging studies had shown coronary and aortic atherosclerosis, they had not been high enough to evaluate the ascending aorta).    In September 2017 his nuclear vasodilator stress test was a low risk study with a small/mild area of ischemia in the apex. EF 59%. He has not had angina pectoris.  He no longer smokes.  Activity is limited by gait instability and he uses a cane.  He denies exertional dyspnea.  He also denies syncope, palpitations, focal neurological events, chest pain, claudication.  He continues to complain of fatigue.  Per chart review, has clear evidence of sinus node dysfunction. He had complaints of chronic fatigue. 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 He would benefit from a rate response sensor implantation (preferably a device with minute ventilation sensor). This procedure has been fully reviewed with the patient and written informed consent has been obtained and elective procedure was scheduled for 06/14/18.   Hospital Course     On 06/14/18 the patient was taken to the cath  lab for successful dual chamber PPM placement for symptomatic bradycardia and sinus node dysfunction.    Pt was moderately hypertensive on presentation and was placed on carvedilol 6.25mg  twice daily with good response. No other medication changes.    Device details: Generator Medtronic [place] DR MRI  model [id number] serial number [id number] H Right atrial lead Medtronic [id number] serial number [id number] Right ventricular lead Medtronic [id number] serial number [id number]  Consultants: None   The patient was seen and examined by Dr. Sallyanne Kuster who feels that he is stable and ready for discharge. PPM care instructions review by RN and Dr. Sallyanne Kuster. Will place printed instructions in AVS. Pt has wound check appointment on 06/27/18 at 230pm and f/u with Dr. Sallyanne Kuster on 09/19/2018 at 10am.  _____________  Discharge Vitals Blood pressure 112/88, pulse 64, temperature 97.7 F (36.5 C), resp. rate (!) 22, height 5\' 7"  (1.702 m), weight 91 kg, SpO2 97 %.  Filed Weights   06/14/18 1258 06/15/18 0650  Weight: 90.3 kg 91 kg   Labs & Radiologic Studies    Fasting Lipid Panel Recent Labs    06/15/18 0222  CHOL 194  HDL 53  LDLCALC 125*  TRIG 82  CHOLHDL 3.7   Dg Chest 2 View  Result Date: 06/15/2018 CLINICAL DATA:  Pacemaker placement EXAM: CHEST - 2 VIEW COMPARISON:  02/02/2018 FINDINGS: Interval placement of dual lead pacemaker with leads in the right atrium and right ventricle in good position. Negative for pneumothorax Chronic lung disease with basilar scarring. Negative for heart failure or infiltrate. IMPRESSION: Satisfactory transvenous pacemaker placement. Electronically Signed   By: Franchot Gallo M.D.   On: 06/15/2018 08:29   Disposition   Pt is being discharged home today in good condition.  Follow-up Plans & Appointments    Follow-up Information    [place] Office Follow up on 06/27/2018.   Specialty:  Cardiology Why:  Your wound care check appointment will be 06/27/18 at 230pm at the [facility] information: [address] [place] [phone]       Sanda Klein, MD Follow up on 09/19/2018.   Specialty:  Cardiology Why:  Your follow up will be on 09/19/2018 at 10am with Dr. Sallyanne Kuster.  Contact information: [address] [place] [address] [phone]          Discharge Instructions    Call MD for:  difficulty breathing, headache or visual disturbances   Complete by:  As directed    Call MD for:  extreme fatigue   Complete by:  As directed    Call MD for:  persistant dizziness or light-headedness   Complete by:  As directed    Call MD for:  persistant nausea and vomiting   Complete by:  As directed    Call MD for:  redness, tenderness, or signs of infection (pain, swelling, redness, odor or green/yellow discharge around incision site)   Complete by:  As directed    Call MD for:  severe uncontrolled pain   Complete by:  As directed    Call MD for:  temperature >100.4   Complete by:  As directed    Diet - low sodium heart healthy   Complete by:  As directed    Discharge instructions   Complete by:  As directed    Supplemental Discharge Instructions for  Pacemaker/Defibrillator Patients  Activity Do not raise your left/right arm above shoulder level or extend it backward beyond  shoulder level for 2 weeks. Wear the arm sling as a reminder or as needed for comfort for 2 weeks. No heavy lifting or vigorous activity with your left/right arm for 6-8 weeks.    NO DRIVING is preferable for 2 weeks; If absolutely necessary, drive only short, familiar routes. DO wear your seatbelt, even if it crosses over the pacemaker site.  WOUND CARE Keep the wound area clean and dry.  Remove the dressing the day after you return home (usually 48 hours after the procedure). DO NOT SUBMERGE UNDER WATER UNTIL FULLY HEALED (no tub baths, hot tubs, swimming pools, etc.).  You  may shower or take a sponge bath after the dressing is removed. DO NOT SOAK the area and do not  allow the shower to directly spray on the site. If you have staples, these will be removed in the office in 7-14 days. If you have tape/steri-strips on your wound, these will fall off; do not pull them off prematurely.   No bandage is needed on the site.  DO  NOT apply any creams, oils, or ointments to the wound area. If you notice any drainage or discharge from the wound, any swelling, excessive redness or bruising at the site, or if you develop a fever > 101? F after you are discharged home, call the office at once.  Special Instructions You are still able to use cellular telephones.  Avoid carrying your cellular phone near your device. When traveling through airports, show security personnel your identification card to avoid being screened in the metal detectors.  Avoid arc welding equipment, MRI testing (magnetic resonance imaging), TENS units (transcutaneous nerve stimulators).  Call the office for questions about other devices. Avoid electrical appliances that are in poor condition or are not properly grounded. Microwave ovens are safe to be near or to operate.  Additional information for defibrillator patients should your device go off: If your device goes off ONCE and you feel fine afterward, notify the clinic at [phone]. If your device goes off ONCE and you do not feel well afterward, call 911. If your device goes off TWICE or more in one day, call 911.  DO NOT DRIVE YOURSELF OR A FAMILY MEMBER WITH A DEFIBRILLATOR TO THE HOSPITAL-CALL 911.   Increase activity slowly   Complete by:  As directed      Discharge Medications   Allergies as of 06/15/2018      Reactions   Penicillins Rash   Has patient had a PCN reaction causing immediate rash, facial/tongue/throat swelling, SOB or lightheadedness with hypotension: Yes Has patient had a PCN reaction causing severe rash involving mucus membranes or skin necrosis: No Has patient had a PCN reaction that required hospitalization:  No Has patient had a PCN reaction occurring within the last 10 years: No If all of the above answers are "NO", then may proceed with Cephalosporin use.      Medication List    TAKE these medications   acetaminophen 500 MG tablet Commonly known as:  TYLENOL Take 500 mg by mouth every 6 (six) hours as needed (for pain).   amLODipine 2.5 MG tablet Commonly known as:  NORVASC Take 1 tablet (2.5 mg total) by mouth daily.   aspirin EC 81 MG tablet Take 81 mg by mouth daily.   carbamide peroxide 6.5 % OTIC solution Commonly known as:  DEBROX Place 5 drops into both ears daily as needed (for earwax).   carvedilol 6.25 MG tablet Commonly known as:  COREG Take  1 tablet (6.25 mg total) by mouth 2 (two) times daily with a meal.   fluticasone 50 MCG/ACT nasal spray Commonly known as:  FLONASE Place 1 spray into both nostrils daily as needed for allergies or rhinitis.   LORazepam 1 MG tablet Commonly known as:  ATIVAN TAKE 1/2 TO 1 TABLET DAILY AS NEEDED What changed:  See the new instructions.   multivitamin with minerals Tabs tablet Take 0.5 tablets by mouth every other day. Centrum   omeprazole 20 MG capsule Commonly known as:  PRILOSEC Take 20 mg by mouth daily before breakfast.   polyethylene glycol powder powder Commonly known as:  GLYCOLAX/MIRALAX Take 17 g by mouth daily as needed (constipation.).   potassium chloride SA 20 MEQ tablet Commonly known as:  K-DUR,KLOR-CON Take 1 tablet (20 mEq total) by mouth daily.   predniSONE 5 MG tablet Commonly known as:  DELTASONE Take 1 tablet (5 mg total) by mouth daily with breakfast.   traZODone 50 MG tablet Commonly known as:  DESYREL   ZYTIGA 250 MG tablet Generic drug:  abiraterone acetate TAKE 4 TABLETS (1000 MG) BY MOUTH DAILY. TAKE ON AN EMPTY STOMACH 1 HOUR BEFORE OR 2 HOURS AFTER A MEAL What changed:  See the new instructions.        Acute coronary syndrome (MI, NSTEMI, STEMI, etc) this admission?: No.      Outstanding Labs/Studies   None   Duration of Discharge Encounter   Greater than 30 minutes including physician time.  Signed, Kathyrn Drown, NP 06/15/2018, 9:53 AM

## 2018-06-15 NOTE — Progress Notes (Signed)
   Progress Note  Patient Name: Tony [name] Date of Encounter: 06/15/2018  Primary Cardiologist: No primary care provider on file. Floreen Teegarden  Subjective   No discomfort at site. Slight oozing on dressing, no hematoma.  Inpatient Medications    Scheduled Meds: . abiraterone acetate  1,000 mg Oral Daily  . amLODipine  2.5 mg Oral Daily  . aspirin EC  81 mg Oral Daily  . carvedilol  6.25 mg Oral BID WC  . mupirocin ointment  1 application Topical Once  . predniSONE  5 mg Oral Q breakfast   Continuous Infusions: . sodium chloride Stopped (06/15/18 0341)   PRN Meds: sodium chloride, acetaminophen, LORazepam, ondansetron (ZOFRAN) IV   Vital Signs    Vitals:   06/15/18 0200 06/15/18 0300 06/15/18 0650 06/15/18 0737  BP: (!) 147/87 123/85 (!) 116/92 112/88  Pulse: 62 (!) 59 73 64  Resp: 16 (!) 21 (!) 24 (!) 22  Temp:   (!) 97.5 F (36.4 C) 97.7 F (36.5 C)  TempSrc:   Oral   SpO2: 94% 93% 93% 97%  Weight:   91 kg   Height:        Intake/Output Summary (Last 24 hours) at 06/15/2018 0754 Last data filed at 06/15/2018 0600 Gross per 24 hour  Intake 944.41 ml  Output 650 ml  Net 294.41 ml   Filed Weights   06/14/18 1258 06/15/18 0650  Weight: 90.3 kg 91 kg    Telemetry    Sinus/A paced, V sensed rhythm - Personally Reviewed  ECG    ApVs w PACs, RBBB+LAFB - Personally Reviewed  Physical Exam  Slight oozing on dressing, otherwise healthy pacemaker site GEN: No acute distress.   Neck: No JVD Cardiac: RRR, split S2, no murmurs, rubs, or gallops.  Respiratory: Clear to auscultation bilaterally. GI: Soft, nontender, non-distended  MS: No edema; No deformity. Neuro:  Nonfocal  Psych: Normal affect   Labs     Radiology    No results found.  On my review, normal device configuration, no complications.  Cardiac Studies   Device check this AM P waves 2.8 mV, impedance 494 ohm, threshold 0.5V@0 .4 ms R waves 3.3-4.4 mV, impedance 589 ohm, threshold  0.5V@0 .4 ms 0.1% V pacing  Patient Profile     77 y.o. 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 Wound check 10/14/ 2019 is scheduled. Office w me in 3 months. Activity restrictions and wound care reviewed. Carvedilol started for BP control and aneurysms.  For questions or updates, please contact [place] Please consult [email or web address] for contact info under        Signed, Sanda Klein, MD  06/15/2018, 7:54 AM

## 2018-06-15 NOTE — Discharge Instructions (Signed)

## 2018-06-22 ENCOUNTER — Ambulatory Visit (INDEPENDENT_AMBULATORY_CARE_PROVIDER_SITE_OTHER): Payer: Medicare Other | Admitting: *Deleted

## 2018-06-22 DIAGNOSIS — Z23 Encounter for immunization: Secondary | ICD-10-CM

## 2018-06-24 ENCOUNTER — Telehealth: Payer: Self-pay | Admitting: Cardiovascular Disease

## 2018-06-24 NOTE — Telephone Encounter (Signed)
New Message:    Wife said pt received his new pacemaker card today, it had her name on it, no the patient's name.

## 2018-06-24 NOTE — Telephone Encounter (Signed)
Adv pt that Dr.C will contact [employer] to have the pt pacemaker card corrected. Pt voiced appreciation for the assistance.

## 2018-06-24 NOTE — Telephone Encounter (Signed)
Seriously? Ok will ask [employer] to fix

## 2018-06-27 ENCOUNTER — Ambulatory Visit (INDEPENDENT_AMBULATORY_CARE_PROVIDER_SITE_OTHER): Payer: Medicare Other | Admitting: *Deleted

## 2018-06-27 DIAGNOSIS — I495 Sick sinus syndrome: Secondary | ICD-10-CM

## 2018-06-27 LAB — CUP PACEART INCLINIC DEVICE CHECK
Brady Statistic RA Percent Paced: 67.8 %
Brady Statistic RV Percent Paced: 0.1 %
Implantable Lead Implant Date: 20191001
Implantable Lead Location: 753859
Implantable Lead Model: 5076
Implantable Lead Model: 5076
Lead Channel Pacing Threshold Amplitude: 0.5 V
Lead Channel Sensing Intrinsic Amplitude: 3.1 mV
Lead Channel Sensing Intrinsic Amplitude: 5.8 mV
MDC IDC LEAD IMPLANT DT: 20191001
MDC IDC LEAD LOCATION: 753860
MDC IDC MSMT LEADCHNL RA PACING THRESHOLD AMPLITUDE: 0.5 V
MDC IDC MSMT LEADCHNL RA PACING THRESHOLD PULSEWIDTH: 0.4 ms
MDC IDC MSMT LEADCHNL RV PACING THRESHOLD PULSEWIDTH: 0.4 ms
MDC IDC PG IMPLANT DT: 20191001
MDC IDC SESS DTM: 20191014151942

## 2018-06-27 NOTE — Progress Notes (Signed)
Wound check appointment. Staples removed. Wound without redness or drainage. Slight edema, soft to palpate, yellow ecchymosis around device, yellow to purple ecchymosis down left arm/chest. Incision edges approximated, wound well healed. Educated patient to call [facility] with signs and symptoms of active bleeding.   Normal device function. Thresholds, sensing, and impedances consistent with implant measurements. Device programmed at 3.5V for extra safety margin until 3 month visit. Histogram distribution appropriate for patient and level of activity. No mode switches or high ventricular rates noted. Patient educated about wound care, arm mobility, lifting restrictions. ROV with [facility] 09/19/17

## 2018-06-29 ENCOUNTER — Other Ambulatory Visit: Payer: Self-pay | Admitting: Oncology

## 2018-07-06 ENCOUNTER — Inpatient Hospital Stay (HOSPITAL_COMMUNITY): Admission: RE | Admit: 2018-07-06 | Payer: Medicare Other | Source: Ambulatory Visit

## 2018-07-11 ENCOUNTER — Other Ambulatory Visit: Payer: Self-pay | Admitting: Family Medicine

## 2018-07-11 MED FILL — ZYTIGA 250 MG TABLET: 250 | 30 days supply | Qty: 120 | Fill #0

## 2018-07-15 NOTE — Telephone Encounter (Signed)
Patient's wife calling to check the status of this refill. Please advise. Would like a call back with an update.

## 2018-07-29 ENCOUNTER — Other Ambulatory Visit: Payer: Self-pay | Admitting: *Deleted

## 2018-07-29 DIAGNOSIS — I10 Essential (primary) hypertension: Secondary | ICD-10-CM

## 2018-07-29 MED ORDER — AMLODIPINE BESYLATE 2.5 MG PO TABS: 2.5000 mg | ORAL_TABLET | Freq: Every day | ORAL | 2 refills | Status: DC

## 2018-08-02 ENCOUNTER — Other Ambulatory Visit: Payer: Self-pay | Admitting: Oncology

## 2018-08-02 ENCOUNTER — Encounter: Payer: Self-pay | Admitting: Pharmacist

## 2018-08-02 NOTE — Progress Notes (Signed)
Oral Oncology Pharmacist Encounter  Received notification from [facility] that they had discovered medication interaction with new medication that patient had been started on in October Coreg 6.25 mg given twice daily. 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 With low dose of Coreg added after patient has been quite stable on Zytiga, this interaction is not deemed to be clinically significant. Patient's blood pressure and heart rates will continue to be monitored prior to any dose increase of Coreg. Patient may possibly need a dose increase of Coreg in the future if the Zytiga is discontinued, but this can be addressed in the future. No change to current therapy indicated at this time. Pharmacy has been updated with this information.  Johny Drilling, PharmD, BCPS, BCOP  08/02/2018 4:00 PM Oral Oncology Clinic [phone]

## 2018-08-08 MED FILL — ZYTIGA 250 MG TABLET: 250 | 30 days supply | Qty: 120 | Fill #0

## 2018-08-14 ENCOUNTER — Other Ambulatory Visit: Payer: Self-pay | Admitting: Oncology

## 2018-08-15 ENCOUNTER — Ambulatory Visit (INDEPENDENT_AMBULATORY_CARE_PROVIDER_SITE_OTHER): Payer: Medicare Other | Admitting: Family Medicine

## 2018-08-15 ENCOUNTER — Encounter: Payer: Self-pay | Admitting: Family Medicine

## 2018-08-15 VITALS — BP 118/84 | HR 92 | Temp 98.3°F | Wt 207.0 lb

## 2018-08-15 DIAGNOSIS — J209 Acute bronchitis, unspecified: Secondary | ICD-10-CM | POA: Diagnosis not present

## 2018-08-15 MED ORDER — AZITHROMYCIN 250 MG PO TABS: ORAL_TABLET | ORAL | 0 refills | Status: DC

## 2018-08-15 MED ORDER — BENZONATATE 200 MG PO CAPS: 200.0000 mg | ORAL_CAPSULE | Freq: Two times a day (BID) | ORAL | 0 refills | Status: DC | PRN

## 2018-08-15 NOTE — Progress Notes (Signed)
   Subjective:    Patient ID: Josehua Hammar, male    DOB: 02/21/1941, 77 y.o.   MRN: [id number]  HPI Here for 5 days of chest congestion and coughing up yellow sputum. No fever.    Review of Systems  Constitutional: Negative.   HENT: Positive for congestion and postnasal drip. Negative for sinus pressure, sinus pain and sore throat.   Eyes: Negative.   Respiratory: Positive for cough and chest tightness. Negative for shortness of breath and wheezing.   Cardiovascular: Negative.        Objective:   Physical Exam  Constitutional: He appears well-developed and well-nourished. No distress.  HENT:  Right Ear: External ear normal.  Left Ear: External ear normal.  Nose: Nose normal.  Mouth/Throat: Oropharynx is clear and moist.  Eyes: Conjunctivae are normal.  Neck: No thyromegaly present.  Cardiovascular: Normal rate, regular rhythm, normal heart sounds and intact distal pulses.  Pulmonary/Chest: Effort normal. No stridor. No respiratory distress. He has no wheezes. He has no rales.  Scattered rhonchi   Lymphadenopathy:    He has no cervical adenopathy.          Assessment & Plan:  Bronchitis, treat with a Zpack. Use Benzonatate prn.  Alysia Penna, MD

## 2018-08-16 ENCOUNTER — Other Ambulatory Visit: Payer: Self-pay | Admitting: Family Medicine

## 2018-08-19 ENCOUNTER — Encounter: Payer: Self-pay | Admitting: Adult Health

## 2018-08-19 ENCOUNTER — Ambulatory Visit (INDEPENDENT_AMBULATORY_CARE_PROVIDER_SITE_OTHER): Payer: Medicare Other

## 2018-08-19 ENCOUNTER — Ambulatory Visit (INDEPENDENT_AMBULATORY_CARE_PROVIDER_SITE_OTHER): Payer: Medicare Other | Admitting: Adult Health

## 2018-08-19 VITALS — BP 126/90 | HR 84 | Temp 97.5°F | Wt 205.0 lb

## 2018-08-19 DIAGNOSIS — J40 Bronchitis, not specified as acute or chronic: Secondary | ICD-10-CM

## 2018-08-19 DIAGNOSIS — R05 Cough: Secondary | ICD-10-CM | POA: Diagnosis not present

## 2018-08-19 MED ORDER — IPRATROPIUM-ALBUTEROL 0.5-2.5 (3) MG/3ML IN SOLN: 3.0000 mL | Freq: Once | RESPIRATORY_TRACT | Status: DC

## 2018-08-19 MED ORDER — DOXYCYCLINE HYCLATE 100 MG PO CAPS: 100.0000 mg | ORAL_CAPSULE | Freq: Two times a day (BID) | ORAL | 0 refills | Status: DC

## 2018-08-19 NOTE — Progress Notes (Signed)
Subjective:    Patient ID: Tony [name], male    DOB: 02-26-1941, 77 y.o.   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   He was seen earlier in the week by another provider for five days of chest congestion and productive cough with yellow sputum. He denied fever at this time. He was treated for bronchitis with a Z pack.   Today he reports that he finished his antibiotics but he continues to have a productive cough, wheezing, and shortness of breathing. Feels as though he is coughing up less sputum. Denies fevers or chills. Cough is worse at night.     Review of Systems See HPI  Past Medical History:  Diagnosis Date  . Gastric outlet obstruction 02/03/2018   [name] 02/03/2018  . GERD (gastroesophageal reflux disease)   . History of hiatal hernia    small/notes 02/03/2018  . Hypertension    no meds  . Presence of permanent cardiac pacemaker   . Prostate cancer ([place]) 02/08/14   Gleason 4+5=9, PSA 15.65  . Radiation   . Sinus problem   . SSS (sick sinus syndrome) ([facility])     Social History   Socioeconomic History  . Marital status: Married    Spouse name: Not on file  . Number of children: Not on file  . Years of education: Not on file  . Highest education level: Not on file  Occupational History  . Not on file  Social Needs  . Financial resource strain: Not on file  . Food insecurity:    Worry: Not on file    Inability: Not on file  . Transportation needs:    Medical: Not on file    Non-medical: Not on file  Tobacco Use  . Smoking status: Former Smoker    Years: 60.00    Types: Cigars    Last attempt to quit: 09/14/2012    Years since quitting: 5.9  . Smokeless tobacco: Never  Used  . Tobacco comment: little cigars; the small ones"  Substance and Sexual Activity  . Alcohol use: Yes    Alcohol/week: 14.0 standard drinks    Types: 14 Cans of beer per week  . Drug use: No  . Sexual activity: Not Currently  Lifestyle  . Physical activity:    Days per week: Not on file    Minutes per session: Not on file  . Stress: Not on file  Relationships  . Social connections:    Talks on phone: Not on file    Gets together: Not on file    Attends religious service: Not on file    Active member of club or organization: Not on file    Attends meetings of clubs or organizations: Not on file    Relationship status: Not on file  . Intimate partner violence:    Fear of current or ex partner: Not on file    Emotionally abused: Not on file    Physically abused: Not on file    Forced sexual activity: Not on file  Other Topics Concern  . Not on file  Social History Narrative  . Not on file    Past Surgical History:  Procedure Laterality Date  . ESOPHAGOGASTRODUODENOSCOPY (EGD) WITH PROPOFOL N/A 02/03/2018  Procedure: ESOPHAGOGASTRODUODENOSCOPY (EGD) WITH PROPOFOL;  Surgeon: Clarene Essex, MD;  Location: [facility];  Service: Endoscopy;  Laterality: N/A;  . FRACTURE SURGERY    . INSERT / REPLACE / REMOVE PACEMAKER  06/14/2018  . LAPAROSCOPIC APPENDECTOMY N/A 01/21/2018   Procedure: APPENDECTOMY LAPAROSCOPIC;  Surgeon: Kinsinger, Arta Bruce, MD;  Location: [facility];  Service: General;  Laterality: N/A;  . PACEMAKER IMPLANT N/A 06/14/2018   Procedure: PACEMAKER IMPLANT - Dual Chamber;  Surgeon: Sanda Klein, MD;  Location: [facility];  Service: Cardiovascular;  Laterality: N/A;  . PROSTATE BIOPSY  02/08/14   gleason 4+5=9, 12/12 cores positive, 54 gm  . RADIOACTIVE SEED IMPLANT N/A 06/29/2014   Procedure: RADIOACTIVE SEED IMPLANT;  Surgeon: Bernestine Amass, MD;  Location[facility];  Service: Urology;  Laterality: N/A;  . WRIST FRACTURE SURGERY Right  1980s    Family History  Problem Relation Age of Onset  . Alzheimer's disease Mother   . Alzheimer's disease Father   . Cancer Father        prostate  . Arthritis Sister   . Cancer Brother        prostate    Allergies  Allergen Reactions  . Penicillins Rash    Has patient had a PCN reaction causing immediate rash, facial/tongue/throat swelling, SOB or lightheadedness with hypotension: Yes Has patient had a PCN reaction causing severe rash involving mucus membranes or skin necrosis: No Has patient had a PCN reaction that required hospitalization: No Has patient had a PCN reaction occurring within the last 10 years: No If all of the above answers are "NO", then may proceed with Cephalosporin use.    Current Outpatient Medications on File Prior to Visit  Medication Sig Dispense Refill  . acetaminophen (TYLENOL) 500 MG tablet Take 500 mg by mouth every 6 (six) hours as needed (for pain).    [name] amLODipine (NORVASC) 2.5 MG tablet Take 1 tablet (2.5 mg total) by mouth daily. 90 tablet 2  . aspirin EC 81 MG tablet Take 81 mg by mouth daily.    . benzonatate (TESSALON) 200 MG capsule Take 1 capsule (200 mg total) by mouth 2 (two) times daily as needed for cough. 60 capsule 0  . carbamide peroxide (DEBROX) 6.5 % OTIC solution Place 5 drops into both ears daily as needed (for earwax).    . carvedilol (COREG) 6.25 MG tablet Take 1 tablet (6.25 mg total) by mouth 2 (two) times daily with a meal. 180 tablet 4  . fluticasone (FLONASE) 50 MCG/ACT nasal spray Place 1 spray into both nostrils daily as needed for allergies or rhinitis.    [name] LORazepam (ATIVAN) 1 MG tablet TAKE 1/2 TO 1 TABLET DAILY AS NEEDED (Patient taking differently: Take 0.5 mg by mouth 2 (two) times daily. ) 30 tablet 2  . LORazepam (ATIVAN) 1 MG tablet TAKE 1/2 TO 1 TABLET ONCE DAILY AS NEEDED 30 tablet 2  . Multiple Vitamin (MULTIVITAMIN WITH MINERALS) TABS tablet Take 0.5 tablets by mouth every other day. Centrum    .  omeprazole (PRILOSEC) 20 MG capsule Take 20 mg by mouth daily before breakfast.     . polyethylene glycol powder (GLYCOLAX/MIRALAX) powder Take 17 g by mouth daily as needed (constipation.).    [name] potassium chloride SA (K-DUR,KLOR-CON) 20 MEQ tablet TAKE 1 TABLET DAILY 90 tablet 0  . predniSONE (DELTASONE) 5 MG tablet Take 1 tablet (5 mg total) by mouth daily with breakfast. 90 tablet 1  . predniSONE (DELTASONE)  5 MG tablet TAKE 1 TABLET BY MOUTH EVERY DAY WITH BREAKFAST 90 tablet 0  . traZODone (DESYREL) 50 MG tablet     . ZYTIGA 250 MG tablet TAKE 4 TABLETS (1000 MG) BY MOUTH DAILY. TAKE ON AN EMPTY STOMACH 1 HOUR BEFORE OR 2 HOURS AFTER A MEAL 120 tablet 0   No current facility-administered medications on file prior to visit.     BP 126/90   Pulse 84   Temp (!) 97.5 F (36.4 C)   Wt 205 lb (93 kg)   SpO2 94%   BMI 32.11 kg/m       Objective:   Physical Exam  Constitutional: He is oriented to person, place, and time. He appears well-developed and well-nourished. No distress.  Cardiovascular: Normal rate, regular rhythm, normal heart sounds and intact distal pulses.  Pulmonary/Chest: Effort normal. He has wheezes in the right upper field and the right middle field. He has rhonchi in the right middle field. He has no rales.  Abdominal: Soft. Bowel sounds are normal.  Neurological: He is alert and oriented to person, place, and time.  Skin: Skin is warm and dry. He is not diaphoretic.  Nursing note and vitals reviewed.     Assessment & Plan:  1. Bronchitis - DG Chest 2 View; Future - doxycycline (VIBRAMYCIN) 100 MG capsule; Take 1 capsule (100 mg total) by mouth 2 (two) times daily.  Dispense: 14 capsule; Refill: 0 - DG Chest 2 View - ipratropium-albuterol (DUONEB) 0.5-2.5 (3) MG/3ML nebulizer solution 3 mL  Patient endorsed feeling improved slightly after duoneb. He continued to have wheezing in right upper and right middle.   Dorothyann Peng, NP

## 2018-08-30 ENCOUNTER — Other Ambulatory Visit: Payer: Self-pay | Admitting: Oncology

## 2018-09-08 MED FILL — ZYTIGA 250 MG TABLET: 250 | 30 days supply | Qty: 120 | Fill #0

## 2018-09-12 ENCOUNTER — Other Ambulatory Visit: Payer: Self-pay | Admitting: Family Medicine

## 2018-09-15 ENCOUNTER — Inpatient Hospital Stay: Payer: Medicare Other | Attending: Oncology

## 2018-09-15 DIAGNOSIS — Z5111 Encounter for antineoplastic chemotherapy: Secondary | ICD-10-CM | POA: Diagnosis not present

## 2018-09-15 DIAGNOSIS — E876 Hypokalemia: Secondary | ICD-10-CM | POA: Insufficient documentation

## 2018-09-15 DIAGNOSIS — D751 Secondary polycythemia: Secondary | ICD-10-CM | POA: Insufficient documentation

## 2018-09-15 DIAGNOSIS — I10 Essential (primary) hypertension: Secondary | ICD-10-CM | POA: Insufficient documentation

## 2018-09-15 DIAGNOSIS — C61 Malignant neoplasm of prostate: Secondary | ICD-10-CM | POA: Insufficient documentation

## 2018-09-15 LAB — CMP (CANCER CENTER ONLY)
ALBUMIN: 4.2 g/dL (ref 3.5–5.0)
ALT: 15 U/L (ref 0–44)
AST: 17 U/L (ref 15–41)
Alkaline Phosphatase: 84 U/L (ref 38–126)
Anion gap: 11 (ref 5–15)
BILIRUBIN TOTAL: 0.9 mg/dL (ref 0.3–1.2)
BUN: 11 mg/dL (ref 8–23)
CO2: 24 mmol/L (ref 22–32)
Calcium: 9.5 mg/dL (ref 8.9–10.3)
Chloride: 103 mmol/L (ref 98–111)
Creatinine: 0.86 mg/dL (ref 0.61–1.24)
GFR, Est AFR Am: >60 mL/min (ref >60)
GLUCOSE: 119 mg/dL — AB (ref 70–99)
POTASSIUM: 4.4 mmol/L (ref 3.5–5.1)
Sodium: 138 mmol/L (ref 135–145)
TOTAL PROTEIN: 7.3 g/dL (ref 6.5–8.1)

## 2018-09-15 LAB — CBC WITH DIFFERENTIAL (CANCER CENTER ONLY)
ABS IMMATURE GRANULOCYTES: 0.02 10*3/uL (ref 0.00–0.07)
BASOS PCT: 1 %
Basophils Absolute: 0.1 10*3/uL (ref 0.0–0.1)
EOS PCT: 2 %
Eosinophils Absolute: 0.2 10*3/uL (ref 0.0–0.5)
HCT: 48.7 % (ref 39.0–52.0)
HEMOGLOBIN: 15.9 g/dL (ref 13.0–17.0)
Immature Granulocytes: 0 %
Lymphocytes Relative: 14 %
Lymphs Abs: 1 10*3/uL (ref 0.7–4.0)
MCH: 30.6 pg (ref 26.0–34.0)
MCHC: 32.6 g/dL (ref 30.0–36.0)
MCV: 93.8 fL (ref 80.0–100.0)
MONO ABS: 0.5 10*3/uL (ref 0.1–1.0)
MONOS PCT: 6 %
NEUTROS ABS: 5.6 10*3/uL (ref 1.7–7.7)
Neutrophils Relative %: 77 %
PLATELETS: 172 10*3/uL (ref 150–400)
RBC: 5.19 MIL/uL (ref 4.22–5.81)
RDW: 13.1 % (ref 11.5–15.5)
WBC Count: 7.4 10*3/uL (ref 4.0–10.5)
nRBC: 0 % (ref 0.0–0.2)

## 2018-09-16 ENCOUNTER — Other Ambulatory Visit: Payer: Self-pay | Admitting: *Deleted

## 2018-09-16 ENCOUNTER — Inpatient Hospital Stay: Payer: Medicare Other

## 2018-09-16 ENCOUNTER — Telehealth: Payer: Self-pay | Admitting: Oncology

## 2018-09-16 ENCOUNTER — Inpatient Hospital Stay: Payer: Medicare Other | Admitting: Oncology

## 2018-09-16 VITALS — BP 136/86 | HR 77 | Temp 97.7°F | Resp 18

## 2018-09-16 VITALS — BP 134/94 | HR 85 | Temp 98.1°F | Resp 17 | Ht 67.0 in | Wt 211.1 lb

## 2018-09-16 DIAGNOSIS — I10 Essential (primary) hypertension: Secondary | ICD-10-CM | POA: Diagnosis not present

## 2018-09-16 DIAGNOSIS — Z5111 Encounter for antineoplastic chemotherapy: Secondary | ICD-10-CM | POA: Diagnosis not present

## 2018-09-16 DIAGNOSIS — C61 Malignant neoplasm of prostate: Secondary | ICD-10-CM

## 2018-09-16 DIAGNOSIS — D751 Secondary polycythemia: Secondary | ICD-10-CM

## 2018-09-16 DIAGNOSIS — E876 Hypokalemia: Secondary | ICD-10-CM | POA: Diagnosis not present

## 2018-09-16 LAB — PROSTATE-SPECIFIC AG, SERUM (LABCORP)

## 2018-09-16 MED ORDER — LEUPROLIDE ACETATE (3 MONTH) 22.5 MG IM KIT
PACK | INTRAMUSCULAR | Status: AC
  Filled 2018-09-16: qty 22.5

## 2018-09-16 MED ORDER — LEUPROLIDE ACETATE (3 MONTH) 22.5 MG IM KIT
22.5000 mg | PACK | Freq: Once | INTRAMUSCULAR | Status: AC
  Administered 2018-09-16: 22.5 mg via INTRAMUSCULAR

## 2018-09-16 MED ORDER — PREDNISONE 5 MG PO TABS: 5.0000 mg | ORAL_TABLET | Freq: Every day | ORAL | 1 refills | Status: DC

## 2018-09-16 NOTE — Patient Instructions (Signed)

## 2018-09-16 NOTE — Telephone Encounter (Signed)
Printed calendar and avs. °

## 2018-09-16 NOTE — Progress Notes (Signed)
Hematology and Oncology Follow Up Visit  Tony [name] [id number] 1941-01-12 78 y.o. 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  He was initially diagnosed in 2015 with a Gleason score 4+5 = 9 and a PSA 15.7.    Prior Therapy:  He is status post radiation therapy for external beam radiation completed in September 2015 for a total of 45 gray. He also status post seed implant brachytherapy boost completed in October 2015.   He received total of 1 year of androgen deprivation 22,015 and 2016. His PSA nadir was to 0.47 in November 2016. His PSA was 4.08 in May 2017.  Current therapy:  Androgen deprivation in the form of Lupron 22.5 mg resumed on 03/02/2016. This will be given every 3 months.  Zytiga 1000 mg with prednisone started in August 2017.   Interim History: [name] presents today for a repeat evaluation.  Since the last visit, he reports no major concerns or complaints.  He tolerated Zytiga without any new side effects.  He denied any excessive fatigue, tiredness, edema or unsteadiness.  He continues to ambulate without any difficulties.  Performance status and quality of life remains unchanged.  He eats well and his weight is maintained.  His blood pressure has been under reasonable control.  He does not report any headaches blurred vision or double vision.  He denies any alteration of mental status or confusion.  He does not report any fevers, chills or sweats. He does not report any chest pain shortness of breath cough or hemoptysis. Does not report any palpitation orthopnea or PND.  He does not report any nausea, vomiting.  He denies any abdominal distention, constipation or diarrhea.  He does not report any bone pain or pathological fractures.  He does not report any ecchymosis or bruising.  He does not report any bleeding or clotting tendency.  He denies any heat  or cold intolerance.  He denies any mood changes.  Rest of his review of systems is negative.  Medications: I have reviewed the patient's current medications.  Current Outpatient Medications  Medication Sig Dispense Refill  . acetaminophen (TYLENOL) 500 MG tablet Take 500 mg by mouth every 6 (six) hours as needed (for pain).    [name] amLODipine (NORVASC) 2.5 MG tablet Take 1 tablet (2.5 mg total) by mouth daily. 90 tablet 2  . aspirin EC 81 MG tablet Take 81 mg by mouth daily.    . benzonatate (TESSALON) 200 MG capsule Take 1 capsule (200 mg total) by mouth 2 (two) times daily as needed for cough. 60 capsule 0  . carbamide peroxide (DEBROX) 6.5 % OTIC solution Place 5 drops into both ears daily as needed (for earwax).    . carvedilol (COREG) 6.25 MG tablet Take 1 tablet (6.25 mg total) by mouth 2 (two) times daily with a meal. 180 tablet 4  . doxycycline (VIBRAMYCIN) 100 MG capsule Take 1 capsule (100 mg total) by mouth 2 (two) times daily. 14 capsule 0  . fluticasone (FLONASE) 50 MCG/ACT nasal spray Place 1 spray into both nostrils daily as needed for allergies or rhinitis.    [name] LORazepam (ATIVAN) 1 MG tablet TAKE 1/2 TO 1 TABLET DAILY AS NEEDED (Patient taking differently: Take 0.5 mg by mouth 2 (two) times daily. ) 30 tablet 2  . LORazepam (ATIVAN) 1 MG tablet TAKE 1/2 TO 1 TABLET ONCE DAILY AS NEEDED 30 tablet 2  .  Multiple Vitamin (MULTIVITAMIN WITH MINERALS) TABS tablet Take 0.5 tablets by mouth every other day. Centrum    . omeprazole (PRILOSEC) 20 MG capsule Take 20 mg by mouth daily before breakfast.     . polyethylene glycol powder (GLYCOLAX/MIRALAX) powder Take 17 [name] by mouth daily as needed (constipation.).    [name] potassium chloride SA (K-DUR,KLOR-CON) 20 MEQ tablet TAKE 1 TABLET DAILY 90 tablet 0  . predniSONE (DELTASONE) 5 MG tablet TAKE 1 TABLET BY MOUTH EVERY DAY WITH BREAKFAST 90 tablet 0  . predniSONE (DELTASONE) 5 MG tablet Take 1 tablet (5 mg total) by mouth daily with breakfast. 90  tablet 1  . traZODone (DESYREL) 50 MG tablet     . ZYTIGA 250 MG tablet TAKE 4 TABLETS (1000 MG) BY MOUTH DAILY. TAKE ON AN EMPTY STOMACH 1 HOUR BEFORE OR 2 HOURS AFTER A MEAL 120 tablet 0   Current Facility-Administered Medications  Medication Dose Route Frequency Provider Last Rate Last Dose  . ipratropium-albuterol (DUONEB) 0.5-2.5 (3) MG/3ML nebulizer solution 3 mL  3 mL Nebulization Once Dorothyann Peng, NP         Allergies:  Allergies  Allergen Reactions  . Penicillins Rash    Has patient had a PCN reaction causing immediate rash, facial/tongue/throat swelling, SOB or lightheadedness with hypotension: Yes Has patient had a PCN reaction causing severe rash involving mucus membranes or skin necrosis: No Has patient had a PCN reaction that required hospitalization: No Has patient had a PCN reaction occurring within the last 10 years: No If all of the above answers are "NO", then may proceed with Cephalosporin use.    Past Medical History, Surgical history, Social history, and Family History were reviewed and updated.   Physical Exam:   Blood pressure (!) 134/94, pulse 85, temperature 98.1 F (36.7 C), temperature source Oral, resp. rate 17, height 5\' 7"  (1.702 m), weight 211 lb 1.6 oz (95.8 kg), SpO2 95 %.   ECOG: 1   General appearance: Alert, awake without any distress. Head: Atraumatic without abnormalities Oropharynx: Without any thrush or ulcers. Eyes: No scleral icterus. Lymph nodes: No lymphadenopathy noted in the cervical, supraclavicular, or axillary nodes Heart:regular rate and rhythm, without any murmurs or gallops.   Lung: Clear to auscultation without any rhonchi, wheezes or dullness to percussion. Abdomin: Soft, nontender without any shifting dullness or ascites. Musculoskeletal: No clubbing or cyanosis. Neurological: No motor or sensory deficits. Skin: No rashes or lesions.     Lab Results: Lab Results  Component Value Date   WBC 7.4 09/15/2018    HGB 15.9 09/15/2018   HCT 48.7 09/15/2018   MCV 93.8 09/15/2018   PLT 172 09/15/2018     Chemistry      Component Value Date/Time   NA 138 09/15/2018 0808   NA 141 08/27/2017 0915   K 4.4 09/15/2018 0808   K 4.8 08/27/2017 0915   CL 103 09/15/2018 0808   CO2 24 09/15/2018 0808   CO2 26 08/27/2017 0915   BUN 11 09/15/2018 0808   BUN 16.2 08/27/2017 0915   CREATININE 0.86 09/15/2018 0808   CREATININE 0.9 08/27/2017 0915   GLU 105 01/10/2016      Component Value Date/Time   CALCIUM 9.5 09/15/2018 0808   CALCIUM 9.8 08/27/2017 0915   ALKPHOS 84 09/15/2018 0808   ALKPHOS 83 08/27/2017 0915   AST 17 09/15/2018 0808   AST 23 08/27/2017 0915   ALT 15 09/15/2018 0808   ALT 17 08/27/2017 0915   BILITOT  0.9 09/15/2018 0808   BILITOT 0.95 08/27/2017 0915       Results for CAYMAN, KIELBASA (MRN [id number]) as of 09/16/2018 09:03  Ref. Range 06/08/2018 07:40 09/15/2018 08:08  Prostate Specific Ag, Serum Latest Ref Range: 0.0 - 4.0 ng/mL <0.1 <0.1      Impression and Plan:   78 year old man with:  1.  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  He continues to tolerate Zytiga without any major complications.  Risks and benefits of continuing this therapy was discussed today and long-term complications were reiterated.  These complications include hypertension, edema and adrenal insufficiency.  The plan is to continue with the same dose and schedule and use different salvage therapy if his PSA starts to rise.   2. Androgen depravation: He continues to tolerate Lupron without any complications.  Long-term issues including osteoporosis, sexual dysfunction as well as weight gain were discussed today.  Is agreeable to continue.   3. Polycythemia: His hemoglobin is within normal range at this time.  He has cut down on his smoking which have helped at this time.  4. Hypokalemia: His electrolytes will be monitored periodically and potassium  continues to be within normal range.  5. Hypertension: No issues with his blood pressure noted at this time.  This will continue to be monitored on Zytiga.  6.  Prognosis and goals of care: Therapy remains palliative at this time although his performance status is excellent and aggressive therapy will be warranted.  7. Follow-up: Will be in 3 months.  25  minutes was spent with the patient face-to-face today.  More than 50% of time was dedicated to updating his disease status, treatment options and long-term complication associated with therapy.    Zola Button, MD 1/3/20209:08 AM

## 2018-09-19 ENCOUNTER — Telehealth: Payer: Self-pay

## 2018-09-19 ENCOUNTER — Encounter: Payer: Self-pay | Admitting: Cardiovascular Disease

## 2018-09-19 ENCOUNTER — Ambulatory Visit (INDEPENDENT_AMBULATORY_CARE_PROVIDER_SITE_OTHER): Payer: Medicare Other | Admitting: Cardiovascular Disease

## 2018-09-19 VITALS — BP 110/84 | HR 70 | Ht 66.0 in | Wt 213.0 lb

## 2018-09-19 DIAGNOSIS — Z950 Presence of cardiac pacemaker: Secondary | ICD-10-CM | POA: Diagnosis not present

## 2018-09-19 DIAGNOSIS — I453 Trifascicular block: Secondary | ICD-10-CM

## 2018-09-19 DIAGNOSIS — I714 Abdominal aortic aneurysm, without rupture, unspecified: Secondary | ICD-10-CM

## 2018-09-19 DIAGNOSIS — E7800 Pure hypercholesterolemia, unspecified: Secondary | ICD-10-CM

## 2018-09-19 DIAGNOSIS — I712 Thoracic aortic aneurysm, without rupture: Secondary | ICD-10-CM | POA: Diagnosis not present

## 2018-09-19 DIAGNOSIS — I495 Sick sinus syndrome: Secondary | ICD-10-CM

## 2018-09-19 DIAGNOSIS — I2510 Atherosclerotic heart disease of native coronary artery without angina pectoris: Secondary | ICD-10-CM

## 2018-09-19 DIAGNOSIS — C61 Malignant neoplasm of prostate: Secondary | ICD-10-CM

## 2018-09-19 DIAGNOSIS — I7121 Aneurysm of the ascending aorta, without rupture: Secondary | ICD-10-CM

## 2018-09-19 NOTE — Progress Notes (Signed)
Cardiology OfficeNote    Date:  09/19/2018   ID:  Tony [name] [place], DOB [birth year], MRN [id number]  PCP:  Tony, [name], MD  Cardiologist:   Sanda Klein, MD    Chief Complaint  Patient presents with  . Follow-up    3 months.    History of Present Illness:  Tony [name] is a 78 y.o. male with asymptomatic peripheral and coronary arterial calcification on imaging studies performed for workup of prostate cancer, AAA and recently incidentally diagnosed aneurysm of the ascending aorta on chest CT. He received a dual-chamber permanent pacemaker for sinus bradycardia with chronotropic incompetence in October 2019 [employer]).  The patient specifically denies any chest pain at rest exertion, dyspnea at rest or with exertion, orthopnea, paroxysmal nocturnal dyspnea, syncope, palpitations, focal neurological deficits, intermittent claudication, lower extremity edema, unexplained weight gain, cough, hemoptysis or wheezing.  Since pacemaker implantation he has more energy and takes fewer naps.  His family reports that he is much more active.  Activity remains somewhat limited by gait instability and he uses a cane.  Most recent evaluation shows that his abdominal aortic aneurysm measures 3.1 cm in the ascending aortic aneurysm measures 4.7 cm.  He is scheduled for repeat aortic CT angiography in May 2020.  In September 2017 his nuclear vasodilator stress test was a low risk study with a small/mild area of ischemia in the apex. EF 59%.   Pacemaker interrogation shows normal device function.  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  Lead parameters are excellent.  At this point estimated generator longevity is 11.7 years.  He has had a couple of episodes of rapid heartbeat.  Most of these are atrial tachycardia, lasting less than 2 seconds.  There is one episode of 6 beat nonsustained ventricular  tachycardia.  There is no atrial fibrillation.  Heart rate histogram distribution appears appropriate for his level of activity.  Lead parameters are all within desirable range.  His activity level is 2.4 hours/day.  He is now on second line chemotherapy for prostate cancer refractory to antiandrogen therapy, with excellent response to Zytiga.  PSA is undetectable.  He has been taking steroids and has gained some weight.  Past Medical History:  Diagnosis Date  . Gastric outlet obstruction 02/03/2018   [name] 02/03/2018  . GERD (gastroesophageal reflux disease)   . History of hiatal hernia    small/notes 02/03/2018  . Hypertension    no meds  . Presence of permanent cardiac pacemaker   . Prostate cancer ([place]) 02/08/14   Gleason 4+5=9, PSA 15.65  . Radiation   . Sinus problem   . SSS (sick sinus syndrome) [facility])     Past Surgical History:  Procedure Laterality Date  . ESOPHAGOGASTRODUODENOSCOPY (EGD) WITH PROPOFOL N/A 02/03/2018   Procedure: ESOPHAGOGASTRODUODENOSCOPY (EGD) WITH PROPOFOL;  Surgeon: Clarene Essex, MD;  Location: [facility];  Service: Endoscopy;  Laterality: N/A;  . FRACTURE SURGERY    . INSERT / REPLACE / REMOVE PACEMAKER  06/14/2018  . LAPAROSCOPIC APPENDECTOMY N/A 01/21/2018   Procedure: APPENDECTOMY LAPAROSCOPIC;  Surgeon: Kinsinger, Arta Bruce, MD;  Location: [facility];  Service: General;  Laterality: N/A;  . PACEMAKER IMPLANT N/A 06/14/2018   Procedure: PACEMAKER IMPLANT - Dual Chamber;  Surgeon: Sanda Klein, MD;  Location: [facility];  Service: Cardiovascular;  Laterality: N/A;  . PROSTATE BIOPSY  02/08/14   gleason 4+5=9, 12/12 cores positive, 54  gm  . RADIOACTIVE SEED IMPLANT N/A 06/29/2014   Procedure: RADIOACTIVE SEED IMPLANT;  Surgeon: Bernestine Amass, MD;  Location: [facility]  Service: Urology;  Laterality: N/A;  . WRIST FRACTURE SURGERY Right 1980s    Current Medications: Outpatient Medications Prior to Visit  Medication Sig Dispense  Refill  . acetaminophen (TYLENOL) 500 MG tablet Take 500 mg by mouth every 6 (six) hours as needed (for pain).    [name] amLODipine (NORVASC) 2.5 MG tablet Take 1 tablet (2.5 mg total) by mouth daily. 90 tablet 2  . aspirin EC 81 MG tablet Take 81 mg by mouth daily.    . benzonatate (TESSALON) 200 MG capsule Take 1 capsule (200 mg total) by mouth 2 (two) times daily as needed for cough. 60 capsule 0  . carbamide peroxide (DEBROX) 6.5 % OTIC solution Place 5 drops into both ears daily as needed (for earwax).    . carvedilol (COREG) 6.25 MG tablet Take 1 tablet (6.25 mg total) by mouth 2 (two) times daily with a meal. 180 tablet 4  . doxycycline (VIBRAMYCIN) 100 MG capsule Take 1 capsule (100 mg total) by mouth 2 (two) times daily. 14 capsule 0  . fluticasone (FLONASE) 50 MCG/ACT nasal spray Place 1 spray into both nostrils daily as needed for allergies or rhinitis.    [name] LORazepam (ATIVAN) 1 MG tablet TAKE 1/2 TO 1 TABLET DAILY AS NEEDED (Patient taking differently: Take 0.5 mg by mouth 2 (two) times daily. ) 30 tablet 2  . LORazepam (ATIVAN) 1 MG tablet TAKE 1/2 TO 1 TABLET ONCE DAILY AS NEEDED 30 tablet 2  . Multiple Vitamin (MULTIVITAMIN WITH MINERALS) TABS tablet Take 0.5 tablets by mouth every other day. Centrum    . omeprazole (PRILOSEC) 20 MG capsule Take 20 mg by mouth daily before breakfast.     . polyethylene glycol powder (GLYCOLAX/MIRALAX) powder Take 17 [name] by mouth daily as needed (constipation.).    [name] potassium chloride SA (K-DUR,KLOR-CON) 20 MEQ tablet TAKE 1 TABLET DAILY 90 tablet 0  . predniSONE (DELTASONE) 5 MG tablet TAKE 1 TABLET BY MOUTH EVERY DAY WITH BREAKFAST 90 tablet 0  . predniSONE (DELTASONE) 5 MG tablet Take 1 tablet (5 mg total) by mouth daily with breakfast. 90 tablet 1  . traZODone (DESYREL) 50 MG tablet     . ZYTIGA 250 MG tablet TAKE 4 TABLETS (1000 MG) BY MOUTH DAILY. TAKE ON AN EMPTY STOMACH 1 HOUR BEFORE OR 2 HOURS AFTER A MEAL 120 tablet 0   Facility-Administered  Medications Prior to Visit  Medication Dose Route Frequency Provider Last Rate Last Dose  . ipratropium-albuterol (DUONEB) 0.5-2.5 (3) MG/3ML nebulizer solution 3 mL  3 mL Nebulization Once Dorothyann Peng, NP         Allergies:   Penicillins   Social History   Socioeconomic History  . Marital status: Married    Spouse name: Not on file  . Number of children: Not on file  . Years of education: Not on file  . Highest education level: Not on file  Occupational History  . Not on file  Social Needs  . Financial resource strain: Not on file  . Food insecurity:    Worry: Not on file    Inability: Not on file  . Transportation needs:    Medical: Not on file    Non-medical: Not on file  Tobacco Use  . Smoking status: Former Smoker    Years: 60.00    Types: Cigars  Last attempt to quit: 09/14/2012    Years since quitting: 6.0  . Smokeless tobacco: Never Used  . Tobacco comment: little cigars; the small ones"  Substance and Sexual Activity  . Alcohol use: Yes    Alcohol/week: 14.0 standard drinks    Types: 14 Cans of beer per week  . Drug use: No  . Sexual activity: Not Currently  Lifestyle  . Physical activity:    Days per week: Not on file    Minutes per session: Not on file  . Stress: Not on file  Relationships  . Social connections:    Talks on phone: Not on file    Gets together: Not on file    Attends religious service: Not on file    Active member of club or organization: Not on file    Attends meetings of clubs or organizations: Not on file    Relationship status: Not on file  Other Topics Concern  . Not on file  Social History Narrative         Family History:  The patient's family history includes Alzheimer's disease in his father and mother; Arthritis in his sister; Cancer in his brother and father.   ROS:   Please see the history of present illness.    ROS all other systems are reviewed and are negative   PHYSICAL EXAM:   VS:  BP 110/84 (BP Location:  Left Arm, Patient Position: Sitting, Cuff Size: Normal)   Pulse 70   Ht 5\' 6"  (1.676 m)   Wt 213 lb (96.6 kg)   BMI 34.38 kg/m      General: Alert, oriented x3, no distress, moderately obese.  Beginning to have cushingoid facies.  Well-healed left subclavian pacemaker site. Head: no evidence of trauma, PERRL, EOMI, no exophtalmos or lid lag, no myxedema, no xanthelasma; normal ears, nose and oropharynx Neck: normal jugular venous pulsations and no hepatojugular reflux; brisk carotid pulses without delay and no carotid bruits Chest: clear to auscultation, no signs of consolidation by percussion or palpation, normal fremitus, symmetrical and full respiratory excursions Cardiovascular: normal position and quality of the apical impulse, regular rhythm, normal first and second heart sounds, no murmurs, rubs or gallops Abdomen: no tenderness or distention, no masses by palpation, no abnormal pulsatility or arterial bruits, normal bowel sounds, no hepatosplenomegaly Extremities: no clubbing, cyanosis or edema; 2+ radial, ulnar and brachial pulses bilaterally; 2+ right femoral, posterior tibial and dorsalis pedis pulses; 2+ left femoral, posterior tibial and dorsalis pedis pulses; no subclavian or femoral bruits Neurological: grossly nonfocal Psych: Normal mood and affect    Wt Readings from Last 3 Encounters:  09/19/18 213 lb (96.6 kg)  09/16/18 211 lb 1.6 oz (95.8 kg)  08/19/18 205 lb (93 kg)      Studies/Labs Reviewed:   EKG:  EKG is not ordered today.  The ekg ordered 06/15/2018 demonstrates atrial paced rhythm with prolonged AV delay and bifascicular block (RBBB plus LAFB).  Recent Labs: 02/02/2018: B Natriuretic Peptide 226.5 02/18/2018: Magnesium 2.2 09/15/2018: ALT 15; BUN 11; Creatinine 0.86; Hemoglobin 15.9; Platelet Count 172; Potassium 4.4; Sodium 138   Lipid Panel    Component Value Date/Time   CHOL 194 06/15/2018 0222   TRIG 82 06/15/2018 0222   HDL 53 06/15/2018 0222    CHOLHDL 3.7 06/15/2018 0222   VLDL 16 06/15/2018 0222   LDLCALC 125 (H) 06/15/2018 0222   ASSESSMENT:    1. Sick sinus syndrome ([place])   2. Pacemaker   3. Trifascicular  block   4. Coronary artery disease involving native coronary artery of native heart without angina pectoris   5. Ascending aortic aneurysm ([place])   6. 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[place])   7. Hypercholesterolemia   8. Prostate cancer ([place])      PLAN:  In order of problems listed above:  1. Sinus bradycardia/SSS: Improved symptomatically following pacemaker implantation. 2. PPM: Normal device function.  Plan remote downloads every 3 months and yearly office visit.  Carvedilol was initiated after pacemaker implantation since he has ascending and abdominal aortic aneurysms. 3. RBBB+LAFB+long PR: Despite extensive evidence of infra-hisian disease he requires very little ventricular pacing. 4. CAD: Low risk stress test 2017 but with a small area of ischemia.  He is asymptomatic.  He takes carvedilol and amlodipine which may be function as antianginal medications.  Invasive evaluation does not appear to be indicated.  He no longer smokes.   5. Ascending aortic aneurysm: Moderate in size and asymptomatic.  Scheduled for CT angiography in May. 6. AAA: Unchanged on serial studies more than a year apart.  Plan to monitor it with CT angiography at the same time as his ascending aneurysm.  7. HLP: Target LDL-C preferably less than 70.  We were supposed to discuss starting a statin at today's appointment, but reviewing the pacemaker and his aneurysms took more time than expected.  We will call him with a prescription for atorvastatin 20 mg once daily with plans to recheck a lipid profile 3 months later. 8. HTN: Excellent control. 9. Prostate Ca: Encouraging response to treatment with Zytiga.  Discussed the fact that steroid therapy can promote salt retention and weight gain.  He is asked to be more disciplined with his  diet.   Medication Adjustments/Labs and Tests Ordered: Current medicines are reviewed at length with the patient today.  Concerns regarding medicines are outlined above.  Medication changes, Labs and Tests ordered today are listed in the Patient Instructions below. Patient Instructions  Medication Instructions:  Dr Sallyanne Kuster recommends that you continue on your current medications as directed. Please refer to the Current Medication list given to you today.  If you need a refill on your cardiac medications before your next appointment, please call your pharmacy.   Testing/Procedures: Remote monitoring is used to monitor your Pacemaker or ICD from home. This monitoring reduces the number of office visits required to check your device to one time per year. It allows [place] to keep an eye on the functioning of your device to ensure it is working properly. You are scheduled for a device check from home on Monday, April 6th, 2020. You may send your transmission at any time that day. If you have a wireless device, the transmission will be sent automatically. After your physician reviews your transmission, you will receive a postcard with your next transmission date.  To improve our patient care and to more adequately follow your device, [facility] has decided, as a practice, to start following each patient four times a year with your home monitor. This means that you may experience a remote appointment that is close to an in-office appointment with your physician. Your insurance will apply at the same rate as other remote monitoring transmissions.  Follow-Up: At [facility], you and your health needs are our priority.  As part of our continuing mission to provide you with exceptional heart care, we have created designated Provider Care Teams.  These Care Teams include your primary Cardiologist (physician) and Advanced Practice Providers (  APPs -  Physician Assistants and Nurse Practitioners) who all work  together to provide you with the care you need, when you need it. You will need a follow up appointment in 12 months. You may see Sanda Klein, MD or one of the following Advanced Practice Providers on your designated Care Team: [place], [place] . Fabian Sharp, PA-C . You will receive a reminder letter in the mail two months in advance. If you don't receive a letter, please call our office to schedule the follow-up appointment.    Signed, Sanda Klein, MD  09/19/2018 10:52 AM    [facility] [address], [place], [place]  [address] Phone: [phone]; Fax: [phone]

## 2018-09-19 NOTE — Patient Instructions (Signed)
Medication Instructions:  Dr Sallyanne Kuster recommends that you continue on your current medications as directed. Please refer to the Current Medication list given to you today.  If you need a refill on your cardiac medications before your next appointment, please call your pharmacy.   Testing/Procedures: Remote monitoring is used to monitor your Pacemaker or ICD from home. This monitoring reduces the number of office visits required to check your device to one time per year. It allows [place] to keep an eye on the functioning of your device to ensure it is working properly. You are scheduled for a device check from home on Monday, April 6th, 2020. You may send your transmission at any time that day. If you have a wireless device, the transmission will be sent automatically. After your physician reviews your transmission, you will receive a postcard with your next transmission date.  To improve our patient care and to more adequately follow your device, [facility] has decided, as a practice, to start following each patient four times a year with your home monitor. This means that you may experience a remote appointment that is close to an in-office appointment with your physician. Your insurance will apply at the same rate as other remote monitoring transmissions.  Follow-Up: At [facility], you and your health needs are our priority.  As part of our continuing mission to provide you with exceptional heart care, we have created designated Provider Care Teams.  These Care Teams include your primary Cardiologist (physician) and Advanced Practice Providers (APPs -  Physician Assistants and Nurse Practitioners) who all work together to provide you with the care you need, when you need it. You will need a follow up appointment in 12 months. You may see Sanda Klein, MD or one of the following Advanced Practice Providers on your designated Care Team: [place], [place] . Fabian Sharp, PA-C . You will receive a  reminder letter in the mail two months in advance. If you don't receive a letter, please call our office to schedule the follow-up appointment.

## 2018-09-19 NOTE — Telephone Encounter (Signed)
Oral Oncology Patient Advocate Encounter  I was successful at securing a grant with [employer] for $7,500. This will keep the out of pocket expense for Zytiga at $0. The grant information is as follows and has been shared with [place].  Approval dates: 09/15/18-09/16/19 ID: [id number] Group: [id number] BIN: [id number] PCN: [id number]  I called the patient to give him this information and had to leave a voicemail.   [place] Patient [place] Phone [phone] Fax [phone]

## 2018-09-29 ENCOUNTER — Other Ambulatory Visit: Payer: Self-pay | Admitting: Oncology

## 2018-10-05 ENCOUNTER — Telehealth: Payer: Self-pay

## 2018-10-05 DIAGNOSIS — Z01812 Encounter for preprocedural laboratory examination: Secondary | ICD-10-CM

## 2018-10-05 DIAGNOSIS — I714 Abdominal aortic aneurysm, without rupture, unspecified: Secondary | ICD-10-CM

## 2018-10-05 DIAGNOSIS — E7800 Pure hypercholesterolemia, unspecified: Secondary | ICD-10-CM

## 2018-10-05 MED ORDER — ATORVASTATIN CALCIUM 20 MG PO TABS: 20.0000 mg | ORAL_TABLET | Freq: Every day | ORAL | 3 refills | Status: DC

## 2018-10-05 MED FILL — ZYTIGA 250 MG TABLET: 250 | 30 days supply | Qty: 120 | Fill #0

## 2018-10-05 NOTE — Telephone Encounter (Signed)
Please schedule for in-office pacemaker check at some point after the CT of the aorta

## 2018-10-05 NOTE — Telephone Encounter (Signed)
-----   Message from Sanda Klein, MD sent at 09/19/2018 10:37 AM EST ----- Please asked him to start atorvastatin 20 mg daily.  Recheck labs in about 3 months.  Can synchronize this with his pre-CT labs (has CT angio in May). Target LDL less than 70. MCr

## 2018-10-05 NOTE — Telephone Encounter (Signed)
Spoke with wife, [name] (okay per DPR).  Wife reports that patient is interested in making adjustments to his pacemaker. States that he was apprehensive at first, so none were made at office visit on 09/19/18. Recall in system for 12 months. Will defer to [facility].  Advised wife of recommendations regarding patient's cholesterol. She will have patient start new medication and have him repeat labs in April/May (CT is 02/07/19).   Prescription sent to patient's preferred pharmacy electronically. Lipid ordered, as well as his pre-CT labs, to be mailed to patient.

## 2018-10-05 NOTE — Telephone Encounter (Signed)
Oral Oncology Patient Advocate Aram Candela, the patients wife called me yesterday 10/04/18 and asked me to look at [place] because she thought that it was going to expire soon. The grant will expire 10/06/18. I looked to see when [place] was going to be filling his Zytiga again and it was set to be filled on 10/06/18. I ran the claim through on 10/05/18 to see if his ins would allow it and we could go ahead and use the [facility] grant before it expired, it did go through. [place] will fill his Fabio Asa today with [facility] grant and start billing the [employer] grant next month.  I called [name] and gave her this information and she verbalized understanding and great appreciation.  [place] Patient [place] Phone [phone] Fax [phone]

## 2018-10-06 ENCOUNTER — Other Ambulatory Visit: Payer: Self-pay | Admitting: Family Medicine

## 2018-10-07 ENCOUNTER — Other Ambulatory Visit: Payer: Self-pay | Admitting: Family Medicine

## 2018-10-07 NOTE — Telephone Encounter (Signed)
Copied from [place] [id number]. Topic: Quick Communication - Rx Refill/Question >> Oct 07, 2018 11:20 AM Blase Mess A wrote: Medication: LORazepam (ATIVAN) 1 MG tablet [[id number]]   Has the patient contacted their pharmacy? Yes  (Agent: If no, request that the patient contact the pharmacy for the refill.) (Agent: If yes, when and what did the pharmacy advise?)  Preferred Pharmacy (with phone number or street name): [facility] [place] [phone] (Phone) [phone] (Fax)    Agent: Please be advised that RX refills may take up to 3 business days. We ask that you follow-up with your pharmacy.

## 2018-10-14 LAB — CUP PACEART INCLINIC DEVICE CHECK
Date Time Interrogation Session: 20200131165329
Implantable Lead Location: 753859
Implantable Pulse Generator Implant Date: 20191001
MDC IDC LEAD IMPLANT DT: 20191001
MDC IDC LEAD IMPLANT DT: 20191001
MDC IDC LEAD LOCATION: 753860

## 2018-10-19 NOTE — Telephone Encounter (Signed)
Patient scheduled for follow-up appointment on 02/08/19 with Dr C.

## 2018-10-31 ENCOUNTER — Other Ambulatory Visit: Payer: Self-pay | Admitting: Oncology

## 2018-11-04 ENCOUNTER — Other Ambulatory Visit: Payer: Self-pay | Admitting: Pharmacist

## 2018-11-04 DIAGNOSIS — C61 Malignant neoplasm of prostate: Secondary | ICD-10-CM

## 2018-11-04 MED ORDER — ABIRATERONE ACETATE 250 MG PO TABS: 1000.0000 mg | ORAL_TABLET | Freq: Every day | ORAL | 2 refills | Status: DC

## 2018-11-04 MED FILL — ABIRATERONE ACETATE 250 MG: 250 | 30 days supply | Qty: 120 | Fill #0

## 2018-11-04 NOTE — Progress Notes (Signed)
Oral Oncology Pharmacist Encounter  Received notification from the pharmacy that patient's insurance will no longer pay for brand name Zytiga and is mandating patient receive generic abiraterone formulation. New prescription, with enough refills to last until next office visit, has been e-scribed to the [facility] outpatient pharmacy.  Johny Drilling, PharmD, BCPS, BCOP  11/04/2018 9:05 AM Oral Oncology Clinic [phone]

## 2018-11-09 ENCOUNTER — Other Ambulatory Visit: Payer: Self-pay | Admitting: Family Medicine

## 2018-11-10 ENCOUNTER — Other Ambulatory Visit: Payer: Self-pay | Admitting: *Deleted

## 2018-11-14 ENCOUNTER — Ambulatory Visit (INDEPENDENT_AMBULATORY_CARE_PROVIDER_SITE_OTHER): Payer: Medicare Other | Admitting: Family Medicine

## 2018-11-14 ENCOUNTER — Encounter: Payer: Self-pay | Admitting: Family Medicine

## 2018-11-14 VITALS — BP 114/70 | HR 56 | Temp 98.0°F | Resp 12 | Wt 214.0 lb

## 2018-11-14 DIAGNOSIS — R739 Hyperglycemia, unspecified: Secondary | ICD-10-CM | POA: Diagnosis not present

## 2018-11-14 DIAGNOSIS — F418 Other specified anxiety disorders: Secondary | ICD-10-CM | POA: Diagnosis not present

## 2018-11-14 DIAGNOSIS — I10 Essential (primary) hypertension: Secondary | ICD-10-CM

## 2018-11-14 DIAGNOSIS — G4700 Insomnia, unspecified: Secondary | ICD-10-CM

## 2018-11-14 NOTE — Patient Instructions (Signed)
A few things to remember from today's visit:   Essential hypertension, benign  Other specified anxiety disorders  Insomnia, unspecified type  Hyperglycemia  You are scheduled for fasting labs with heart doctor, if elevated we will plan on checking A1C next visit.  No changes today. Fall precautions.  Please be sure medication list is accurate. If a new problem present, please set up appointment sooner than planned today.

## 2018-11-14 NOTE — Progress Notes (Signed)
HPI:   Mr.Cas Bram Hottel is a 78 y.o. male, who is here today for 6 months follow up.   She was last seen on 05/23/18.  Since his last OV he has followed with urologist and oncologist.  HTN on Amlodipine 2.5 mg and Coreg 6.25 mg bid. He denies headache,chest pain,dyspnea,or edema.  +Fatigue,stable. CSD,AAA,and sick sinus syndrome(Dual-chamber pacemaker) following with Dr Sallyanne Kuster.  Trazodone 50 mg has helped with insomnia.He takes medication as needed. Sleep is affected by nocturia,    Anxiety: He is currently on Lorazepam 1 mg daily as needed. Denies depressed mood or suicidal thoughts.   Review of Systems  Constitutional: Positive for fatigue. Negative for activity change, appetite change and fever.  Eyes: Negative for redness and visual disturbance.  Respiratory: Negative for cough, shortness of breath and wheezing.   Cardiovascular: Negative for chest pain, palpitations and leg swelling.  Gastrointestinal: Negative for abdominal pain, nausea and vomiting.  Genitourinary: Positive for frequency. Negative for decreased urine volume, dysuria and hematuria.  Neurological: Negative for weakness and headaches.  Psychiatric/Behavioral: Positive for sleep disturbance. Negative for confusion. The patient is nervous/anxious.     Current Outpatient Medications on File Prior to Visit  Medication Sig Dispense Refill  . abiraterone acetate (ZYTIGA) 250 MG tablet Take 4 tablets (1,000 mg total) by mouth daily. Take on an empty stomach 1 hour before or 2 hours after a meal 120 tablet 2  . acetaminophen (TYLENOL) 500 MG tablet Take 500 mg by mouth every 6 (six) hours as needed (for pain).    [name] amLODipine (NORVASC) 2.5 MG tablet Take 1 tablet (2.5 mg total) by mouth daily. 90 tablet 2  . aspirin EC 81 MG tablet Take 81 mg by mouth daily.    [name] atorvastatin (LIPITOR) 20 MG tablet Take 1 tablet (20 mg total) by mouth daily. 90 tablet 3  . benzonatate (TESSALON) 200 MG capsule  Take 1 capsule (200 mg total) by mouth 2 (two) times daily as needed for cough. 60 capsule 0  . carbamide peroxide (DEBROX) 6.5 % OTIC solution Place 5 drops into both ears daily as needed (for earwax).    . carvedilol (COREG) 6.25 MG tablet Take 1 tablet (6.25 mg total) by mouth 2 (two) times daily with a meal. 180 tablet 4  . doxycycline (VIBRAMYCIN) 100 MG capsule Take 1 capsule (100 mg total) by mouth 2 (two) times daily. 14 capsule 0  . fluticasone (FLONASE) 50 MCG/ACT nasal spray Place 1 spray into both nostrils daily as needed for allergies or rhinitis.    [name] LORazepam (ATIVAN) 1 MG tablet TAKE 1/2 TO 1 TABLET DAILY AS NEEDED (Patient taking differently: Take 0.5 mg by mouth 2 (two) times daily. ) 30 tablet 2  . LORazepam (ATIVAN) 1 MG tablet TAKE 1/2 TO 1 TABLET ONCE DAILY AS NEEDED 30 tablet 3  . Multiple Vitamin (MULTIVITAMIN WITH MINERALS) TABS tablet Take 0.5 tablets by mouth every other day. Centrum    . omeprazole (PRILOSEC) 20 MG capsule Take 20 mg by mouth daily before breakfast.     . polyethylene glycol powder (GLYCOLAX/MIRALAX) powder Take 17 g by mouth daily as needed (constipation.).    [name] potassium chloride SA (K-DUR,KLOR-CON) 20 MEQ tablet TAKE 1 TABLET DAILY 90 tablet 0  . predniSONE (DELTASONE) 5 MG tablet Take 1 tablet (5 mg total) by mouth daily with breakfast. 90 tablet 1  . traZODone (DESYREL) 50 MG tablet      Current Facility-Administered Medications  on File Prior to Visit  Medication Dose Route Frequency Provider Last Rate Last Dose  . ipratropium-albuterol (DUONEB) 0.5-2.5 (3) MG/3ML nebulizer solution 3 mL  3 mL Nebulization Once Dorothyann Peng, NP         Past Medical History:  Diagnosis Date  . Gastric outlet obstruction 02/03/2018   [name] 02/03/2018  . GERD (gastroesophageal reflux disease)   . History of hiatal hernia    small/notes 02/03/2018  . Hypertension    no meds  . Presence of permanent cardiac pacemaker   . 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 Radiation   . Sinus problem   . SSS (sick sinus syndrome) ([facility])    Allergies  Allergen Reactions  . Penicillins Rash    Has patient had a PCN reaction causing immediate rash, facial/tongue/throat swelling, SOB or lightheadedness with hypotension: Yes Has patient had a PCN reaction causing severe rash involving mucus membranes or skin necrosis: No Has patient had a PCN reaction that required hospitalization: No Has patient had a PCN reaction occurring within the last 10 years: No If all of the above answers are "NO", then may proceed with Cephalosporin use.    Social History   Socioeconomic History  . Marital status: Married    Spouse name: Not on file  . Number of children: Not on file  . Years of education: Not on file  . Highest education level: Not on file  Occupational History  . Not on file  Social Needs  . Financial resource strain: Not on file  . Food insecurity:    Worry: Not on file    Inability: Not on file  . Transportation needs:    Medical: Not on file    Non-medical: Not on file  Tobacco Use  . Smoking status: Former Smoker    Years: 60.00    Types: Cigars    Last attempt to quit: 09/14/2012    Years since quitting: 6.1  . Smokeless tobacco: Never Used  . Tobacco comment: little cigars; the small ones"  Substance and Sexual Activity  . Alcohol use: Yes    Alcohol/week: 14.0 standard drinks    Types: 14 Cans of beer per week  . Drug use: No  . Sexual activity: Not Currently  Lifestyle  . Physical activity:    Days per week: Not on file    Minutes per session: Not on file  . Stress: Not on file  Relationships  . Social connections:    Talks on phone: Not on file    Gets together: Not on file    Attends religious service: Not on file    Active member of club or organization: Not on file    Attends meetings of clubs or organizations: Not on file    Relationship status: Not on file  Other Topics Concern  . Not on file    Social History Narrative        Vitals:   11/14/18 0956  BP: 114/70  Pulse: (!) 56  Resp: 12  Temp: 98 F (36.7 C)  SpO2: 98%   Body mass index is 34.54 kg/m.   Physical Exam  Nursing note and vitals reviewed. Constitutional: He is oriented to person, place, and time. He appears well-developed. No distress.  HENT:  Head: Normocephalic and atraumatic.  Mouth/Throat: Oropharynx is clear and moist and mucous membranes are normal.  Eyes: Pupils are equal, round, and reactive to light. Conjunctivae are normal.  Cardiovascular: Regular  rhythm. Bradycardia present.  Murmur (soft SEM RUSB) heard. Pulses:      Posterior tibial pulses are 2+ on the right side and 2+ on the left side.  Respiratory: Effort normal and breath sounds normal. No respiratory distress.  GI: Soft. He exhibits no mass. There is no hepatomegaly. There is no abdominal tenderness.  Musculoskeletal:        General: Edema (Trace pitting LE edema, bilateral.) present.  Lymphadenopathy:    He has no cervical adenopathy.  Neurological: He is alert and oriented to person, place, and time. He has normal strength. No cranial nerve deficit. Gait normal.  Skin: Skin is warm. No rash noted. No erythema.  Psychiatric: He has a normal mood and affect. Cognition and memory are normal.  Well groomed, good eye contact.     ASSESSMENT AND PLAN:   Mr. Warnie Belair Dimalanta was seen today for 6 months follow-up.  Diagnoses and all orders for this visit:  Essential hypertension, benign Adequately controlled. Mild bradycardia today, asymptomatic. Continue low salt diet. No changes in current management.  Other specified anxiety disorders Stable. No changes in Lorazepam 1 mg daily as needed. Some side effects discussed.  Insomnia, unspecified type Trazodone helps but sleep still interrupted by nocturia. No changes in current management. Good sleep hygiene as possible.  Hyperglycemia Glucose 119 and 114. He is on  Prednisone 5 mg daily. He is having labs scheduled,recommend to having fasting labs. HgA1C next OV.     Return in about 6 months (around 05/17/2019) for f/u.     Keivon Garden G. [name], MD  [facility]. [place] office.

## 2018-11-20 ENCOUNTER — Encounter: Payer: Self-pay | Admitting: Family Medicine

## 2018-11-21 ENCOUNTER — Ambulatory Visit: Payer: Medicare Other | Admitting: Family Medicine

## 2018-11-22 ENCOUNTER — Telehealth: Payer: Self-pay

## 2018-11-22 NOTE — Telephone Encounter (Signed)
Pt daughter was checking his home monitor because it was not going off by itself.

## 2018-11-28 ENCOUNTER — Telehealth: Payer: Self-pay | Admitting: Cardiology

## 2018-11-28 ENCOUNTER — Telehealth: Payer: Self-pay

## 2018-11-28 NOTE — Telephone Encounter (Signed)
Oral Oncology Patient Advocate Encounter  I was successful at securing a grant with [employer]. This will keep the out of pocket expense for Zytiga at $0. The grant information is as follows and has been shared with [place].  Approval dates: 11/28/18-12/28/18. 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 [employer] will send the patient a letter in the mail and it will have a form attached for the patient to fill out and send back. Once [employer] gets that back he will be approved until 09/14/19. ID: [id number] Group: [id number] BIN: [id number] PCN: [id number]  I called the patient and spoke to his wife, [name]. I gave her the good news, she verbalized understanding and great appreciation.   [place] [facility] Phone [phone] Fax [phone] 11/28/2018   4:03 PM

## 2018-11-28 NOTE — Telephone Encounter (Signed)
Spoke w/ pt wife (DPR on file). Informed her that pt did not have to send manual transmission everyday. She stated that her daughter has the app on her phone and she send the transmission b/c the app and the device has not updated. I informed her that if her daughter had any questions she could call and we would be happy to help if we could. Pt wife verbalized understanding.

## 2018-11-29 ENCOUNTER — Telehealth: Payer: Self-pay | Admitting: Cardiology

## 2018-11-29 NOTE — Telephone Encounter (Signed)
Patient wife called concerning the 12/19/2018 remote appt. I informed her that he doesn't come to the office on this day that the appt will be done w/ his home monitor. Pt wife verbalized understanding.

## 2018-12-01 NOTE — Progress Notes (Signed)
Subjective:   Tony [name] is a 78 y.o. male who presents for Medicare Annual/Subsequent preventive examination.  Review of Systems:  No ROS.  Medicare Wellness Visit. Additional risk factors are reflected in the social history.  Cardiac Risk Factors include: advanced age (>57men, >79 women);male gender;dyslipidemia;obesity (BMI >30kg/m2);sedentary lifestyle;hypertension Sleep patterns: falls asleep easily and gets up 2 times nightly to void. Pt. Able to go back to sleep despite bathroom interruptions.    Home Safety/Smoke Alarms: Feels safe in home. Smoke alarms in place.  Living environment; residence and Firearm Safety: 1-story house/ trailer, equipment: Radio producer, Type: Plush. Uses cane prn. Seat Belt Safety/Bike Helmet: Wears seat belt.   Male:   CCS- 04/2018, need in 5 years per pt. Report, 04/2023    PSA- No results found for: PSA      Objective:    Vitals: BP 102/78 (BP Location: Right Arm, Patient Position: Sitting, Cuff Size: Normal)   Pulse 70   Temp 97.6 F (36.4 C) (Oral)   Resp 16   Ht 5\' 6"  (1.676 m)   Wt 212 lb (96.2 kg)   SpO2 95%   BMI 34.22 kg/m   Body mass index is 34.22 kg/m.  Advanced Directives 12/02/2018 06/14/2018 02/03/2018 02/03/2018 01/21/2018 11/30/2017 07/16/2016  Does Patient Have a Medical Advance Directive? No No No - No No No  Would patient like information on creating a medical advance directive? No - Patient declined No - Patient declined No - Patient declined No - Patient declined No - Patient declined - Yes - Scientist, clinical (histocompatibility and immunogenetics) given    Tobacco Social History   Tobacco Use  Smoking Status Former Smoker  . Years: 60.00  . Types: Cigars  . Last attempt to quit: 09/14/2012  . Years since quitting: 6.2  Smokeless Tobacco Never Used  Tobacco Comment   little cigars; the small ones"     Counseling given: Not Answered Comment: little cigars; the small ones"  Past Medical History:  Diagnosis Date  . Gastric outlet  obstruction 02/03/2018   [name] 02/03/2018  . GERD (gastroesophageal reflux disease)   . History of hiatal hernia    small/notes 02/03/2018  . Hypertension    no meds  . Presence of permanent cardiac pacemaker   . 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 Radiation   . Sinus problem   . SSS (sick sinus syndrome) [facility])    Past Surgical History:  Procedure Laterality Date  . APPENDECTOMY  2019   hx hematoma s/p appendectomy  . ESOPHAGOGASTRODUODENOSCOPY (EGD) WITH PROPOFOL N/A 02/03/2018   Procedure: ESOPHAGOGASTRODUODENOSCOPY (EGD) WITH PROPOFOL;  Surgeon: Clarene Essex, MD;  Location: [place];  Service: Endoscopy;  Laterality: N/A;  . FRACTURE SURGERY    . INSERT / REPLACE / REMOVE PACEMAKER  06/14/2018  . LAPAROSCOPIC APPENDECTOMY N/A 01/21/2018   Procedure: APPENDECTOMY LAPAROSCOPIC;  Surgeon: Kinsinger, Arta Bruce, MD;  Location: [place];  Service: General;  Laterality: N/A;  . PACEMAKER IMPLANT N/A 06/14/2018   Procedure: PACEMAKER IMPLANT - Dual Chamber;  Surgeon: Sanda Klein, MD;  Location: [place] [facility];  Service: Cardiovascular;  Laterality: N/A;  . PROSTATE BIOPSY  02/08/14   gleason 4+5=9, 12/12 cores positive, 54 gm  . RADIOACTIVE SEED IMPLANT N/A 06/29/2014   Procedure: RADIOACTIVE SEED IMPLANT;  Surgeon: Bernestine Amass, MD;  Location: [facility];  Service: Urology;  Laterality: N/A;  . WRIST FRACTURE SURGERY Right 1980s   Family History  Problem  Relation Age of Onset  . Alzheimer's disease Mother   . Alzheimer's disease Father   . Cancer Father        prostate  . Arthritis Sister   . Cancer Brother        prostate   Social History   Socioeconomic History  . Marital status: Married    Spouse name: Not on file  . Number of children: 3  . Years of education: Not on file  . Highest education level: Not on file  Occupational History  . 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 Financial resource strain: Not hard at all  . Food insecurity:    Worry: Never true    Inability: Never true  . Transportation needs:    Medical: No    Non-medical: No  Tobacco Use  . Smoking status: Former Smoker    Years: 60.00    Types: Cigars    Last attempt to quit: 09/14/2012    Years since quitting: 6.2  . Smokeless tobacco: Never Used  . Tobacco comment: little cigars; the small ones"  Substance and Sexual Activity  . Alcohol use: Yes    Alcohol/week: 14.0 standard drinks    Types: 14 Cans of beer per week  . Drug use: No  . Sexual activity: Not Currently  Lifestyle  . Physical activity:    Days per week: 0 days    Minutes per session: 0 min  . Stress: Only a little  Relationships  . Social connections:    Talks on phone: More than three times a week    Gets together: More than three times a week    Attends religious service: More than 4 times per year    Active member of club or organization: No    Attends meetings of clubs or organizations: Never    Relationship status: Married  Other Topics Concern  . Not on file  Social History Narrative   Lives with wife and 2 dogs in one level home; daughter and her family co-habitate.   Has three daughters, all in [place], supportive. Five grandchildren.   Wants to return to doing silver sneakers once gym re-opens.          Outpatient Encounter Medications as of 12/02/2018  Medication Sig  . abiraterone acetate (ZYTIGA) 250 MG tablet Take 4 tablets (1,000 mg total) by mouth daily. Take on an empty stomach 1 hour before or 2 hours after a meal  . acetaminophen (TYLENOL) 500 MG tablet Take 500 mg by mouth every 6 (six) hours as needed (for pain).  [name] amLODipine (NORVASC) 2.5 MG tablet Take 1 tablet (2.5 mg total) by mouth daily.  [name] aspirin EC 81 MG tablet Take 81 mg by mouth daily.  [name] atorvastatin (LIPITOR) 20 MG tablet Take 1 tablet (20 mg total) by mouth daily.  .  benzonatate (TESSALON) 200 MG capsule Take 1 capsule (200 mg total) by mouth 2 (two) times daily as needed for cough.  . carbamide peroxide (DEBROX) 6.5 % OTIC solution Place 5 drops into both ears daily as needed (for earwax).  . carvedilol (COREG) 6.25 MG tablet Take 1 tablet (6.25 mg total) by mouth 2 (two) times daily with a meal.  . fluticasone (FLONASE) 50 MCG/ACT nasal spray Place 1 spray into both nostrils daily as needed for allergies or rhinitis.  [name] LORazepam (ATIVAN) 1  MG tablet TAKE 1/2 TO 1 TABLET DAILY AS NEEDED (Patient taking differently: Take 0.5 mg by mouth 2 (two) times daily. )  . Multiple Vitamin (MULTIVITAMIN WITH MINERALS) TABS tablet Take 0.5 tablets by mouth every other day. Centrum  . omeprazole (PRILOSEC) 20 MG capsule Take 20 mg by mouth daily before breakfast.   . polyethylene glycol powder (GLYCOLAX/MIRALAX) powder Take 17 g by mouth daily as needed (constipation.).  [name] potassium chloride SA (K-DUR,KLOR-CON) 20 MEQ tablet TAKE 1 TABLET DAILY  . predniSONE (DELTASONE) 5 MG tablet Take 1 tablet (5 mg total) by mouth daily with breakfast.  . traZODone (DESYREL) 50 MG tablet   . [DISCONTINUED] doxycycline (VIBRAMYCIN) 100 MG capsule Take 1 capsule (100 mg total) by mouth 2 (two) times daily.  . [DISCONTINUED] LORazepam (ATIVAN) 1 MG tablet TAKE 1/2 TO 1 TABLET ONCE DAILY AS NEEDED   Facility-Administered Encounter Medications as of 12/02/2018  Medication  . ipratropium-albuterol (DUONEB) 0.5-2.5 (3) MG/3ML nebulizer solution 3 mL    Activities of Daily Living In your present state of health, do you have any difficulty performing the following activities: 12/02/2018 12/02/2018  Hearing? N -  Vision? N -  Difficulty concentrating or making decisions? N -  Comment see MMSE however. Dr. [name] made aware. -  Walking or climbing stairs? Y -  Comment d/t some knee pain. Uses cane prn -  Dressing or bathing? N -  Doing errands, shopping? [place]  and eating ? N N  Using the Toilet? N N  In the past six months, have you accidently leaked urine? N N  Do you have problems with loss of bowel control? N N  Managing your Medications? N N  Managing your Finances? N N  Housekeeping or managing your Housekeeping? N N  Some recent data might be hidden    Patient Care Team: [name], [name], MD as PCP - General (Family Medicine) Sanda Klein, MD as PCP - Cardiology (Cardiology) Wyatt Portela, MD as Consulting Physician (Oncology)   Assessment:   This is a routine wellness examination for [place]. Physical assessment deferred to PCP.   Exercise Activities and Dietary recommendations Current Exercise Habits: The patient does not participate in regular exercise at present, Exercise limited by: cardiac condition(s);orthopedic condition(s) Diet (meal preparation, eat out, water intake, caffeinated beverages, dairy products, fruits and vegetables): in general, a "healthy" diet  . Enjoys vegetables, drinks about 6-8 glasses of water daily. Ideas to lower cholesterol (specifically LDL) discussed.      Goals    . Patient Stated     Get well and maintain health     . Patient Stated     Re-start silver sneakers, walking outside.  Start a new hobby?       Fall Risk Fall Risk  12/02/2018 11/30/2017 05/24/2014 05/18/2014 05/11/2014  Falls in the past year? 0 No No No No  Risk for fall due to : Medication side effect - - - -  Follow up Education provided;Falls prevention discussed - - - -    Depression Screen PHQ 2/9 Scores 12/02/2018 11/30/2017 10/19/2017 05/24/2014  PHQ - 2 Score 0 0 0 0  PHQ- 9 Score 0 - - -    Cognitive Function MMSE - Mini Mental State Exam 12/02/2018 11/30/2017  Orientation to time 5 5  Orientation to Place 5 5  Registration 3 3  Attention/ Calculation 4 1  Recall 2 1  Language- name 2 objects 1  2  Language- name 2 objects-comments could not correctly identify picture of bird (flamingo) -  Language- repeat 1 1   Language- follow 3 step command 3 3  Language- read & follow direction 1 1  Write a sentence 1 1  Copy design 0 1  Copy design-comments objects had 4 sides, not 5 -  Total score 26 24        Immunization History  Administered Date(s) Administered  . Influenza, High Dose Seasonal PF 05/05/2016, 06/01/2017, 06/22/2018    Qualifies for Shingles Vaccine? Yes, advised to receive at local pharmacy.  Screening Tests Health Maintenance  Topic Date Due  . TETANUS/TDAP  07/25/1960  . PNA vac Low Risk Adult (2 of 2 - PPSV23) 12/12/2039 (Originally 09/14/2012)  . INFLUENZA VACCINE  Completed        Plan:     Bring a copy of your living will and/or healthcare power of attorney to your next office visit.  Continue doing brain stimulating activities (puzzles, reading, adult coloring books, staying active) to keep memory sharp.   Please have [facility] fax over vaccine records. Consider getting new shingles vaccine (shingrix) there as well (2 doses, 2-6 months apart) I have personally reviewed and noted the following in the patient's chart:   . Medical and social history . Use of alcohol, tobacco or illicit drugs  . Current medications and supplements . Functional ability and status . Nutritional status . Physical activity . Advanced directives . List of other physicians . Vitals . Screenings to include cognitive, depression, and falls . Referrals and appointments  In addition, I have reviewed and discussed with patient certain preventive protocols, quality metrics, and best practice recommendations. A written personalized care plan for preventive services as well as general preventive health recommendations were provided to patient.     Alphia Moh, RN  12/02/2018

## 2018-12-02 ENCOUNTER — Other Ambulatory Visit: Payer: Self-pay

## 2018-12-02 ENCOUNTER — Ambulatory Visit (INDEPENDENT_AMBULATORY_CARE_PROVIDER_SITE_OTHER): Payer: Medicare Other

## 2018-12-02 VITALS — BP 102/78 | HR 70 | Temp 97.6°F | Resp 16 | Ht 66.0 in | Wt 212.0 lb

## 2018-12-02 DIAGNOSIS — Z0000 Encounter for general adult medical examination without abnormal findings: Secondary | ICD-10-CM

## 2018-12-02 NOTE — Patient Instructions (Addendum)
Bring a copy of your living will and/or healthcare power of attorney to your next office visit.  Continue doing brain stimulating activities (puzzles, reading, adult coloring books, staying active) to keep memory sharp.   Please have [facility] fax over vaccine records. Consider getting new shingles vaccine (shingrix) there as well (2 doses, 2-6 months apart)   Tony [name] , Thank you for taking time to come for your Medicare Wellness Visit. I appreciate your ongoing commitment to your health goals. Please review the following plan we discussed and let me know if I can assist you in the future.   These are the goals we discussed: Goals    . Patient Stated     Get well and maintain health     . Patient Stated     Re-start silver sneakers, walking outside.  Start a new hobby?       This is a list of the screening recommended for you and due dates:  Health Maintenance  Topic Date Due  . Tetanus Vaccine  07/25/1960  . Pneumonia vaccines (2 of 2 - PPSV23) 12/12/2039*  . Flu Shot  Completed  *Topic was postponed. The date shown is not the original due date.      Fall Prevention in the Home, Adult Falls can cause injuries. They can happen to people of all ages. There are many things you can do to make your home safe and to help prevent falls. Ask for help when making these changes, if needed. What actions can I take to prevent falls? General Instructions  Use good lighting in all rooms. Replace any light bulbs that burn out.  Turn on the lights when you go into a dark area. Use night-lights.  Keep items that you use often in easy-to-reach places. Lower the shelves around your home if necessary.  Set up your furniture so you have a clear path. Avoid moving your furniture around.  Do not have throw rugs and other things on the floor that can make you trip.  Avoid walking on wet floors.  If any of your floors are uneven, fix them.  Add color or contrast paint or tape  to clearly mark and help you see: ? Any grab bars or handrails. ? First and last steps of stairways. ? Where the edge of each step is.  If you use a stepladder: ? Make sure that it is fully opened. Do not climb a closed stepladder. ? Make sure that both sides of the stepladder are locked into place. ? Ask someone to hold the stepladder for you while you use it.  If there are any pets around you, be aware of where they are. What can I do in the bathroom?      Keep the floor dry. Clean up any water that spills onto the floor as soon as it happens.  Remove soap buildup in the tub or shower regularly.  Use non-skid mats or decals on the floor of the tub or shower.  Attach bath mats securely with double-sided, non-slip rug tape.  If you need to sit down in the shower, use a plastic, non-slip stool.  Install grab bars by the toilet and in the tub and shower. Do not use towel bars as grab bars. What can I do in the bedroom?  Make sure that you have a light by your bed that is easy to reach.  Do not use any sheets or blankets that are too big for your bed. They should  not hang down onto the floor.  Have a firm chair that has side arms. You can use this for support while you get dressed. What can I do in the kitchen?  Clean up any spills right away.  If you need to reach something above you, use a strong step stool that has a grab bar.  Keep electrical cords out of the way.  Do not use floor polish or wax that makes floors slippery. If you must use wax, use non-skid floor wax. What can I do with my stairs?  Do not leave any items on the stairs.  Make sure that you have a light switch at the top of the stairs and the bottom of the stairs. If you do not have them, ask someone to add them for you.  Make sure that there are handrails on both sides of the stairs, and use them. Fix handrails that are broken or loose. Make sure that handrails are as long as the stairways.  Install  non-slip stair treads on all stairs in your home.  Avoid having throw rugs at the top or bottom of the stairs. If you do have throw rugs, attach them to the floor with carpet tape.  Choose a carpet that does not hide the edge of the steps on the stairway.  Check any carpeting to make sure that it is firmly attached to the stairs. Fix any carpet that is loose or worn. What can I do on the outside of my home?  Use bright outdoor lighting.  Regularly fix the edges of walkways and driveways and fix any cracks.  Remove anything that might make you trip as you walk through a door, such as a raised step or threshold.  Trim any bushes or trees on the path to your home.  Regularly check to see if handrails are loose or broken. Make sure that both sides of any steps have handrails.  Install guardrails along the edges of any raised decks and porches.  Clear walking paths of anything that might make someone trip, such as tools or rocks.  Have any leaves, snow, or ice cleared regularly.  Use sand or salt on walking paths during winter.  Clean up any spills in your garage right away. This includes grease or oil spills. What other actions can I take?  Wear shoes that: ? Have a low heel. Do not wear high heels. ? Have rubber bottoms. ? Are comfortable and fit you well. ? Are closed at the toe. Do not wear open-toe sandals.  Use tools that help you move around (mobility aids) if they are needed. These include: ? Canes. ? Walkers. ? Scooters. ? Crutches.  Review your medicines with your doctor. Some medicines can make you feel dizzy. This can increase your chance of falling. Ask your doctor what other things you can do to help prevent falls. Where to find more information  Centers for Disease Control and Prevention, STEADI: https://garcia.biz/  [employer] on Aging: BrainJudge.co.uk Contact a doctor if:  You are afraid of falling at home.  You feel weak, drowsy, or  dizzy at home.  You fall at home. Summary  There are many simple things that you can do to make your home safe and to help prevent falls.  Ways to make your home safe include removing tripping hazards and installing grab bars in the bathroom.  Ask for help when making these changes in your home. This information is not intended to replace advice given to  you by your health care provider. Make sure you discuss any questions you have with your health care provider. Document Released: 06/27/2009 Document Revised: 04/15/2017 Document Reviewed: 04/15/2017 Elsevier Interactive Patient Education  2019 [place] Maintenance, Male A healthy lifestyle and preventive care is important for your health and wellness. Ask your health care provider about what schedule of regular examinations is right for you. What should I know about weight and diet? Eat a Healthy Diet  Eat plenty of vegetables, fruits, whole grains, low-fat dairy products, and lean protein.  Do not eat a lot of foods high in solid fats, added sugars, or salt.  Maintain a Healthy Weight Regular exercise can help you achieve or maintain a healthy weight. You should:  Do at least 150 minutes of exercise each week. The exercise should increase your heart rate and make you sweat (moderate-intensity exercise).  Do strength-training exercises at least twice a week. Watch Your Levels of Cholesterol and Blood Lipids  Have your blood tested for lipids and cholesterol every 5 years starting at 77 years of age. If you are at high risk for heart disease, you should start having your blood tested when you are 77 years old. You may need to have your cholesterol levels checked more often if: ? Your lipid or cholesterol levels are high. ? You are older than 77 years of age. ? You are at high risk for heart disease. What should I know about cancer screening? Many types of cancers can be detected early and may often be prevented.  Lung Cancer  You should be screened every year for lung cancer if: ? You are a current smoker who has smoked for at least 30 years. ? You are a former smoker who has quit within the past 15 years.  Talk to your health care provider about your screening options, when you should start screening, and how often you should be screened. Colorectal Cancer  Routine colorectal cancer screening usually begins at 77 years of age and should be repeated every 5-10 years until you are 77 years old. You may need to be screened more often if early forms of precancerous polyps or small growths are found. Your health care provider may recommend screening at an earlier age if you have risk factors for colon cancer.  Your health care provider may recommend using home test kits to check for hidden blood in the stool.  A small camera at the end of a tube can be used to examine your colon (sigmoidoscopy or colonoscopy). This checks for the earliest forms of colorectal cancer. Prostate and Testicular Cancer  Depending on your age and overall health, your health care provider may do certain tests to screen for prostate and testicular cancer.  Talk to your health care provider about any symptoms or concerns you have about testicular or prostate cancer. Skin Cancer  Check your skin from head to toe regularly.  Tell your health care provider about any new moles or changes in moles, especially if: ? There is a change in a mole's size, shape, or color. ? You have a mole that is larger than a pencil eraser.  Always use sunscreen. Apply sunscreen liberally and repeat throughout the day.  Protect yourself by wearing long sleeves, pants, a wide-brimmed hat, and sunglasses when outside. What should I know about heart disease, diabetes, and high blood pressure?  If you are 55-24 years of age, have your blood pressure checked every 3-5 years. If you are  26 years of age or older, have your blood pressure checked every  year. You should have your blood pressure measured twice-once when you are at a hospital or clinic, and once when you are not at a hospital or clinic. Record the average of the two measurements. To check your blood pressure when you are not at a hospital or clinic, you can use: ? An automated blood pressure machine at a pharmacy. ? A home blood pressure monitor.  Talk to your health care provider about your target blood pressure.  If you are between 65-62 years old, ask your health care provider if you should take aspirin to prevent heart disease.  Have regular diabetes screenings by checking your fasting blood sugar level. ? If you are at a normal weight and have a low risk for diabetes, have this test once every three years after the age of 14. ? If you are overweight and have a high risk for diabetes, consider being tested at a younger age or more often.  A one-time screening for abdominal aortic aneurysm (AAA) by ultrasound is recommended for men aged 61-75 years who are current or former smokers. What should I know about preventing infection? Hepatitis B If you have a higher risk for hepatitis B, you should be screened for this virus. Talk with your health care provider to find out if you are at risk for hepatitis B infection. Hepatitis C Blood testing is recommended for:  Everyone born from [birth year] through 1964.  Anyone with known risk factors for hepatitis C. Sexually Transmitted Diseases (STDs)  You should be screened each year for STDs including gonorrhea and chlamydia if: ? You are sexually active and are younger than 77 years of age. ? You are older than 77 years of age and your health care provider tells you that you are at risk for this type of infection. ? Your sexual activity has changed since you were last screened and you are at an increased risk for chlamydia or gonorrhea. Ask your health care provider if you are at risk.  Talk with your health care provider about whether  you are at high risk of being infected with HIV. Your health care provider may recommend a prescription medicine to help prevent HIV infection. What else can I do?  Schedule regular health, dental, and eye exams.  Stay current with your vaccines (immunizations).  Do not use any tobacco products, such as cigarettes, chewing tobacco, and e-cigarettes. If you need help quitting, ask your health care provider.  Limit alcohol intake to no more than 2 drinks per day. One drink equals 12 ounces of beer, 5 ounces of wine, or 1 ounces of hard liquor.  Do not use street drugs.  Do not share needles.  Ask your health care provider for help if you need support or information about quitting drugs.  Tell your health care provider if you often feel depressed.  Tell your health care provider if you have ever been abused or do not feel safe at home. This information is not intended to replace advice given to you by your health care provider. Make sure you discuss any questions you have with your health care provider. Document Released: 02/27/2008 Document Revised: 04/29/2016 Document Reviewed: 06/04/2015 Elsevier Interactive Patient Education  2019 [employer].

## 2018-12-05 MED FILL — ABIRATERONE ACETATE 250 MG: 250 | 30 days supply | Qty: 120 | Fill #1

## 2018-12-14 ENCOUNTER — Other Ambulatory Visit: Payer: Self-pay

## 2018-12-14 ENCOUNTER — Inpatient Hospital Stay: Payer: Medicare Other | Attending: Oncology

## 2018-12-14 DIAGNOSIS — D751 Secondary polycythemia: Secondary | ICD-10-CM | POA: Diagnosis not present

## 2018-12-14 DIAGNOSIS — Z5111 Encounter for antineoplastic chemotherapy: Secondary | ICD-10-CM | POA: Diagnosis not present

## 2018-12-14 DIAGNOSIS — C61 Malignant neoplasm of prostate: Secondary | ICD-10-CM | POA: Insufficient documentation

## 2018-12-14 DIAGNOSIS — I10 Essential (primary) hypertension: Secondary | ICD-10-CM | POA: Diagnosis not present

## 2018-12-14 DIAGNOSIS — E876 Hypokalemia: Secondary | ICD-10-CM | POA: Diagnosis not present

## 2018-12-14 LAB — CBC WITH DIFFERENTIAL (CANCER CENTER ONLY)
Abs Immature Granulocytes: 0.04 10*3/uL (ref 0.00–0.07)
Basophils Absolute: 0.1 10*3/uL (ref 0.0–0.1)
Basophils Relative: 1 %
Eosinophils Absolute: 0.2 10*3/uL (ref 0.0–0.5)
Eosinophils Relative: 2 %
HCT: 48.4 % (ref 39.0–52.0)
Hemoglobin: 16 g/dL (ref 13.0–17.0)
Immature Granulocytes: 1 %
Lymphocytes Relative: 13 %
Lymphs Abs: 1 10*3/uL (ref 0.7–4.0)
MCH: 31.3 pg (ref 26.0–34.0)
MCHC: 33.1 g/dL (ref 30.0–36.0)
MCV: 94.5 fL (ref 80.0–100.0)
Monocytes Absolute: 0.5 10*3/uL (ref 0.1–1.0)
Monocytes Relative: 6 %
Neutro Abs: 5.8 10*3/uL (ref 1.7–7.7)
Neutrophils Relative %: 77 %
Platelet Count: 169 10*3/uL (ref 150–400)
RBC: 5.12 MIL/uL (ref 4.22–5.81)
RDW: 12.7 % (ref 11.5–15.5)
WBC Count: 7.5 10*3/uL (ref 4.0–10.5)
nRBC: 0 % (ref 0.0–0.2)

## 2018-12-14 LAB — CMP (CANCER CENTER ONLY)
ALT: 14 U/L (ref 0–44)
AST: 20 U/L (ref 15–41)
Albumin: 4 g/dL (ref 3.5–5.0)
Alkaline Phosphatase: 81 U/L (ref 38–126)
Anion gap: 11 (ref 5–15)
BUN: 9 mg/dL (ref 8–23)
CO2: 25 mmol/L (ref 22–32)
Calcium: 9.1 mg/dL (ref 8.9–10.3)
Chloride: 104 mmol/L (ref 98–111)
Creatinine: 0.89 mg/dL (ref 0.61–1.24)
GFR, Est AFR Am: >60 mL/min (ref >60)
GFR, Estimated: >60 mL/min (ref >60)
Glucose, Bld: 118 mg/dL — ABNORMAL HIGH (ref 70–99)
Potassium: 4.9 mmol/L (ref 3.5–5.1)
Sodium: 140 mmol/L (ref 135–145)
Total Bilirubin: 1 mg/dL (ref 0.3–1.2)
Total Protein: 6.9 g/dL (ref 6.5–8.1)

## 2018-12-15 LAB — PROSTATE-SPECIFIC AG, SERUM (LABCORP): Prostate Specific Ag, Serum: <0.1 ng/mL (ref 0.0–4.0)

## 2018-12-15 NOTE — Telephone Encounter (Signed)
Oral Oncology Patient Advocate Encounter  TAF has been approved through 09/14/19.  [facility] Phone [phone] Fax [phone] 12/15/2018   10:50 AM

## 2018-12-16 ENCOUNTER — Inpatient Hospital Stay: Payer: Medicare Other | Admitting: Oncology

## 2018-12-16 ENCOUNTER — Telehealth: Payer: Self-pay | Admitting: Oncology

## 2018-12-16 ENCOUNTER — Inpatient Hospital Stay: Payer: Medicare Other

## 2018-12-16 ENCOUNTER — Other Ambulatory Visit: Payer: Self-pay

## 2018-12-16 VITALS — BP 144/87 | HR 91 | Temp 97.7°F | Resp 18 | Ht 66.0 in | Wt 216.9 lb

## 2018-12-16 DIAGNOSIS — E876 Hypokalemia: Secondary | ICD-10-CM

## 2018-12-16 DIAGNOSIS — I10 Essential (primary) hypertension: Secondary | ICD-10-CM | POA: Diagnosis not present

## 2018-12-16 DIAGNOSIS — C61 Malignant neoplasm of prostate: Secondary | ICD-10-CM

## 2018-12-16 DIAGNOSIS — D751 Secondary polycythemia: Secondary | ICD-10-CM | POA: Diagnosis not present

## 2018-12-16 DIAGNOSIS — Z5111 Encounter for antineoplastic chemotherapy: Secondary | ICD-10-CM | POA: Diagnosis not present

## 2018-12-16 MED ORDER — LEUPROLIDE ACETATE (3 MONTH) 22.5 MG IM KIT
PACK | INTRAMUSCULAR | Status: AC
  Filled 2018-12-16: qty 22.5

## 2018-12-16 MED ORDER — LEUPROLIDE ACETATE (3 MONTH) 22.5 MG IM KIT
22.5000 mg | PACK | Freq: Once | INTRAMUSCULAR | Status: AC
  Administered 2018-12-16: 10:00:00 22.5 mg via INTRAMUSCULAR

## 2018-12-16 NOTE — Telephone Encounter (Signed)
Scheduled appt per 4/3 sch message. °

## 2018-12-16 NOTE — Patient Instructions (Signed)

## 2018-12-16 NOTE — Progress Notes (Signed)
Hematology and Oncology Follow Up Visit  Tony [name] [id number] 1941-05-19 78 y.o. 12/16/2018 9:35 AM Tony [name], [name], [name], MD   Principle Diagnosis: 78 year old man with advanced prostate cancer diagnosed in 2017.  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    Prior Therapy:  He is status post radiation therapy for external beam radiation completed in September 2015 for a total of 45 gray. He also status post seed implant brachytherapy boost completed in October 2015.   He received total of 1 year of androgen deprivation 22,015 and 2016. His PSA nadir was to 0.47 in November 2016. His PSA was 4.08 in May 2017.  Current therapy:  Androgen deprivation in the form of Lupron 22.5 mg resumed on 03/02/2016. This will be given every 3 months.  Zytiga 1000 mg with prednisone started in August 2017.   Interim History: Tony [name] returns today for a repeat evaluation.  Since the last visit, he reports no major changes in his health.  He has tolerated Zytiga without any recent complaints or hospitalizations.  He denies any lower extremity swelling or edema.  He denies any excessive fatigue or tiredness.  His performance status quality of life remains unchanged.  He has gained some weight however.  Patient denied any alteration mental status, neuropathy, confusion or dizziness.  Denies any headaches or lethargy.  Denies any night sweats, weight loss or changes in appetite.  Denied orthopnea, dyspnea on exertion or chest discomfort.  Denies shortness of breath, difficulty breathing hemoptysis or cough.  Denies any abdominal distention, nausea, early satiety or dyspepsia.  Denies any hematuria, frequency, dysuria or nocturia.  Denies any skin irritation, dryness or rash.  Denies any ecchymosis or petechiae.  Denies any lymphadenopathy or clotting.  Denies any heat or cold intolerance.  Denies any anxiety or  depression.  Remaining review of system is negative.   Medications: I have reviewed the patient's current medications.  Current Outpatient Medications  Medication Sig Dispense Refill  . abiraterone acetate (ZYTIGA) 250 MG tablet Take 4 tablets (1,000 mg total) by mouth daily. Take on an empty stomach 1 hour before or 2 hours after a meal 120 tablet 2  . acetaminophen (TYLENOL) 500 MG tablet Take 500 mg by mouth every 6 (six) hours as needed (for pain).    [name] amLODipine (NORVASC) 2.5 MG tablet Take 1 tablet (2.5 mg total) by mouth daily. 90 tablet 2  . aspirin EC 81 MG tablet Take 81 mg by mouth daily.    [name] atorvastatin (LIPITOR) 20 MG tablet Take 1 tablet (20 mg total) by mouth daily. 90 tablet 3  . benzonatate (TESSALON) 200 MG capsule Take 1 capsule (200 mg total) by mouth 2 (two) times daily as needed for cough. 60 capsule 0  . carbamide peroxide (DEBROX) 6.5 % OTIC solution Place 5 drops into both ears daily as needed (for earwax).    . carvedilol (COREG) 6.25 MG tablet Take 1 tablet (6.25 mg total) by mouth 2 (two) times daily with a meal. 180 tablet 4  . fluticasone (FLONASE) 50 MCG/ACT nasal spray Place 1 spray into both nostrils daily as needed for allergies or rhinitis.    [name] LORazepam (ATIVAN) 1 MG tablet TAKE 1/2 TO 1 TABLET DAILY AS NEEDED (Patient taking differently: Take 0.5 mg by mouth 2 (two) times daily. ) 30 tablet 2  . Multiple Vitamin (MULTIVITAMIN WITH MINERALS) TABS tablet Take  0.5 tablets by mouth every other day. Centrum    . omeprazole (PRILOSEC) 20 MG capsule Take 20 mg by mouth daily before breakfast.     . polyethylene glycol powder (GLYCOLAX/MIRALAX) powder Take 17 [name] by mouth daily as needed (constipation.).    [name] potassium chloride SA (K-DUR,KLOR-CON) 20 MEQ tablet TAKE 1 TABLET DAILY 90 tablet 0  . predniSONE (DELTASONE) 5 MG tablet Take 1 tablet (5 mg total) by mouth daily with breakfast. 90 tablet 1  . traZODone (DESYREL) 50 MG tablet      Current  Facility-Administered Medications  Medication Dose Route Frequency Provider Last Rate Last Dose  . ipratropium-albuterol (DUONEB) 0.5-2.5 (3) MG/3ML nebulizer solution 3 mL  3 mL Nebulization Once Tony Peng, NP         Allergies:  Allergies  Allergen Reactions  . Penicillins Rash    Has patient had a PCN reaction causing immediate rash, facial/tongue/throat swelling, SOB or lightheadedness with hypotension: Yes Has patient had a PCN reaction causing severe rash involving mucus membranes or skin necrosis: No Has patient had a PCN reaction that required hospitalization: No Has patient had a PCN reaction occurring within the last 10 years: No If all of the above answers are "NO", then may proceed with Cephalosporin use.    Past Medical History, Surgical history, Social history, and Family History were reviewed and updated.   Physical Exam:   Blood pressure (!) 144/87, pulse 91, temperature 97.7 F (36.5 C), temperature source Oral, resp. rate 18, height 5\' 6"  (1.676 m), weight 216 lb 14.4 oz (98.4 kg), SpO2 96 %.    ECOG: 1     General appearance: Comfortable appearing without any discomfort Head: Normocephalic without any trauma Oropharynx: Mucous membranes are moist and pink without any thrush or ulcers. Eyes: Pupils are equal and round reactive to light. Lymph nodes: No cervical, supraclavicular, inguinal or axillary lymphadenopathy.   Heart:regular rate and rhythm.  S1 and S2 without leg edema. Lung: Clear without any rhonchi or wheezes.  No dullness to percussion. Abdomin: Soft, nontender, nondistended with good bowel sounds.  No hepatosplenomegaly. Musculoskeletal: No joint deformity or effusion.  Full range of motion noted. Neurological: No deficits noted on motor, sensory and deep tendon reflex exam. Skin: No petechial rash or dryness.  Appeared moist.       Lab Results: Lab Results  Component Value Date   WBC 7.5 12/14/2018   HGB 16.0 12/14/2018   HCT  48.4 12/14/2018   MCV 94.5 12/14/2018   PLT 169 12/14/2018     Chemistry      Component Value Date/Time   NA 140 12/14/2018 0811   NA 141 08/27/2017 0915   K 4.9 12/14/2018 0811   K 4.8 08/27/2017 0915   CL 104 12/14/2018 0811   CO2 25 12/14/2018 0811   CO2 26 08/27/2017 0915   BUN 9 12/14/2018 0811   BUN 16.2 08/27/2017 0915   CREATININE 0.89 12/14/2018 0811   CREATININE 0.9 08/27/2017 0915   GLU 105 01/10/2016      Component Value Date/Time   CALCIUM 9.1 12/14/2018 0811   CALCIUM 9.8 08/27/2017 0915   ALKPHOS 81 12/14/2018 0811   ALKPHOS 83 08/27/2017 0915   AST 20 12/14/2018 0811   AST 23 08/27/2017 0915   ALT 14 12/14/2018 0811   ALT 17 08/27/2017 0915   BILITOT 1.0 12/14/2018 0811   BILITOT 0.95 08/27/2017 0915       Results for LUPE, BONNER (MRN [id number]) as  of 12/16/2018 09:36  Ref. Range 09/15/2018 08:08 12/14/2018 08:11  Prostate Specific Ag, Serum Latest Ref Range: 0.0 - 4.0 ng/mL <0.1 <0.1       Impression and Plan:   78 year old man with:  1.  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  He remains on Zytiga with excellent response and tolerance.  His PSA remains undetectable.  Risks and benefits of continuing this medication long-term was discussed today.  He is agreeable to continue.  Long-term complications such as hypertension, edema and renal insufficiency were reiterated.   2. Androgen depravation: I recommended continuing Lupron indefinitely.  These complications associated with this medication including weight gain, hot flashes and sexual dysfunction.  He has no objection at this time will receive it today and repeat in 3 months.   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  Improved after smoking cessation.  4. Hypokalemia: Potassium remains in normal range at this time.  5. Hypertension: Blood pressures close to normal range without any issues with  Zytiga.  6.  Prognosis and goals of care: His treatment remains palliative but aggressive measures are warranted given his excellent performance status.  7. Follow-up: In 3 months for repeat laboratory testing, physical exam and Lupron injection.  25  minutes was spent with the patient face-to-face today.  More than 50% of time was spent on reviewing his disease status, treatment options and addressing future plan of care.    Zola Button, MD 4/3/20209:35 AM

## 2018-12-19 ENCOUNTER — Ambulatory Visit (INDEPENDENT_AMBULATORY_CARE_PROVIDER_SITE_OTHER): Payer: Medicare Other | Admitting: *Deleted

## 2018-12-19 ENCOUNTER — Other Ambulatory Visit: Payer: Self-pay

## 2018-12-19 DIAGNOSIS — I495 Sick sinus syndrome: Secondary | ICD-10-CM | POA: Diagnosis not present

## 2018-12-19 LAB — CUP PACEART REMOTE DEVICE CHECK
Battery Remaining Longevity: 163 mo
Battery Voltage: 3.14 V
Brady Statistic AP VP Percent: 0.34 %
Brady Statistic AP VS Percent: 79.65 %
Brady Statistic AS VP Percent: 0.05 %
Brady Statistic AS VS Percent: 19.96 %
Brady Statistic RA Percent Paced: 79.6 %
Brady Statistic RV Percent Paced: 0.39 %
Date Time Interrogation Session: 20200406105149
Implantable Lead Implant Date: 20191001
Implantable Lead Implant Date: 20191001
Implantable Lead Location: 753859
Implantable Lead Location: 753860
Implantable Lead Model: 5076
Implantable Lead Model: 5076
Implantable Pulse Generator Implant Date: 20191001
Lead Channel Impedance Value: 323 Ohm
Lead Channel Impedance Value: 342 Ohm
Lead Channel Impedance Value: 437 Ohm
Lead Channel Impedance Value: 494 Ohm
Lead Channel Pacing Threshold Amplitude: 0.5 V
Lead Channel Pacing Threshold Amplitude: 0.625 V
Lead Channel Pacing Threshold Pulse Width: 0.4 ms
Lead Channel Pacing Threshold Pulse Width: 0.4 ms
Lead Channel Sensing Intrinsic Amplitude: 3.25 mV
Lead Channel Sensing Intrinsic Amplitude: 3.25 mV
Lead Channel Sensing Intrinsic Amplitude: 3.875 mV
Lead Channel Sensing Intrinsic Amplitude: 3.875 mV
Lead Channel Setting Pacing Amplitude: 1.5 V
Lead Channel Setting Pacing Amplitude: 2.5 V
Lead Channel Setting Pacing Pulse Width: 0.4 ms
Lead Channel Setting Sensing Sensitivity: 0.9 mV

## 2018-12-26 ENCOUNTER — Encounter: Payer: Self-pay | Admitting: Cardiology

## 2018-12-26 NOTE — Progress Notes (Signed)
Remote pacemaker transmission.   

## 2018-12-29 MED FILL — ABIRATERONE ACETATE 250 MG: 250 | 30 days supply | Qty: 120 | Fill #2

## 2019-01-26 ENCOUNTER — Other Ambulatory Visit: Payer: Self-pay | Admitting: Oncology

## 2019-01-26 DIAGNOSIS — C61 Malignant neoplasm of prostate: Secondary | ICD-10-CM

## 2019-02-02 ENCOUNTER — Other Ambulatory Visit: Payer: Self-pay | Admitting: Family Medicine

## 2019-02-02 DIAGNOSIS — F418 Other specified anxiety disorders: Secondary | ICD-10-CM

## 2019-02-02 MED FILL — ABIRATERONE ACETATE 250 MG: 250 | 30 days supply | Qty: 120 | Fill #0

## 2019-02-03 ENCOUNTER — Other Ambulatory Visit: Payer: Self-pay

## 2019-02-03 ENCOUNTER — Telehealth: Payer: Self-pay | Admitting: *Deleted

## 2019-02-03 ENCOUNTER — Telehealth: Payer: Self-pay | Admitting: Cardiovascular Disease

## 2019-02-03 ENCOUNTER — Other Ambulatory Visit: Payer: Medicare Other | Admitting: *Deleted

## 2019-02-03 DIAGNOSIS — Z01812 Encounter for preprocedural laboratory examination: Secondary | ICD-10-CM | POA: Diagnosis not present

## 2019-02-03 DIAGNOSIS — I714 Abdominal aortic aneurysm, without rupture, unspecified: Secondary | ICD-10-CM

## 2019-02-03 LAB — BASIC METABOLIC PANEL
BUN/Creatinine Ratio: 15 (ref 10–24)
BUN: 13 mg/dL (ref 8–27)
CO2: 25 mmol/L (ref 20–29)
Calcium: 9.3 mg/dL (ref 8.6–10.2)
Chloride: 100 mmol/L (ref 96–106)
Creatinine, Ser: 0.89 mg/dL (ref 0.76–1.27)
GFR calc Af Amer: 96 mL/min/{1.73_m2} (ref >59)
GFR calc non Af Amer: 83 mL/min/{1.73_m2} (ref >59)
Glucose: 115 mg/dL — ABNORMAL HIGH (ref 65–99)
Potassium: 4.3 mmol/L (ref 3.5–5.2)
Sodium: 139 mmol/L (ref 134–144)

## 2019-02-03 NOTE — Telephone Encounter (Signed)
Pt's wife [name] called back to check status on refill. She states that he has taken his last pill today. Please advise.

## 2019-02-03 NOTE — Telephone Encounter (Signed)
Script Screening patients for COVID-19 and reviewing new operational procedures  Greeting - The reason I am calling is to share with you some new changes to our processes that are designed to help [place] keep everyone safe. Is now a good time to speak with you?  Patient says "no' - ask them when you can call back and let them know it's important to do this prior to their appointment.  Patient says "yes" - Doristine Devoid, Antonius Hamidi the first thing I need to do is ask you some screening Questions.  1. To the best of your knowledge, have you been in close contact with any one with a confirmed diagnosis of COVID 19? o No - proceed to next question   2. Have you had any one or more of the following: fever, chills, cough, shortness of breath or any flu-like symptoms? o No - proceed to next question o Yes - Schedule patient for virtual visit  3. Have you been diagnosed with or have a previous diagnosis of COVID 19? o No - proceed to next question  4. I am going to go over a few other symptoms with you. Please let me know if you are experiencing any of the following: . Ear, nose or throat discomfort . A sore throat . Headache . Muscle pain . Diarrhea . Loss of taste or smell o No - proceed to next question  Thank you for answering these questions. Please know we will ask you these questions or similar questions when you arrive for your appointment and again it's how we are keeping everyone safe. Also, to keep you safe, please use the provided hand sanitizer when you enter the building. Evelena Peat Kollman ), we are asking everyone in the building to wear a mask because they help [place] prevent the spread of germs. Do you have a mask of your own, if not, we are happy to provide one for you. The last thing I want to go over with you is the no visitor guidelines. This means no one can attend the appointment with you unless you need physical assistance. I understand this may be different from your past  appointments and I know this may be difficult but please know if someone is driving you we are happy to call them for you once your appointment is over.  [INSERT SITE SPECIFIC CHECK IN PROCEDURES]  Holmes Robertshaw I've given you a lot of information, what questions do you have about what I've talked about today or your appointment tomorrow?

## 2019-02-03 NOTE — Telephone Encounter (Signed)
Lmtcb.  Need to verify with patient that he will do a virtual visit on May 27 for his device check.  If so, please change appt type and time to 30 minutes.  Also see if he will move to the afternoon. Thanks

## 2019-02-03 NOTE — Telephone Encounter (Signed)
Pt 's wife called back about he 5/27 appt stated she does not have a smart phone she would like a call back please.

## 2019-02-03 NOTE — Telephone Encounter (Signed)
D/w  Deedie, she will call pt

## 2019-02-03 NOTE — Telephone Encounter (Signed)
Script Screening patients for COVID-19 and reviewing new operational procedures  Greeting - The reason I am calling is to share with you some new changes to our processes that are designed to help [place] keep everyone safe. Is now a good time to speak with you? Patient says "no' - ask them when you can call back and let them know it's important to do this prior to their appointment.  Patient says "yes" - Great, (insert pt name) the first thing I need to do is ask you some screening Questions.  1. To the best of your knowledge, have you been in close contact with any one with a confirmed diagnosis of COVID 19? o No - proceed to next question o Yes -Schedule patient for a virtual visit  2. Have you had any one or more of the following: fever, chills, cough, shortness of breath or any flu-like symptoms? o No - proceed to next question o Yes - Schedule patient for virtual visit  3. Have you been diagnosed with or have a previous diagnosis of COVID 19? o No - proceed to next question o Yes - Schedule patient for virtual visit (If patient responds "Yes" to questions 1-3, document in scheduling notes.)  4. I am going to go over a few other symptoms with you. Please let me know if you are experiencing any of the following: . Ear, nose or throat discomfort . A sore throat . Headache . Muscle pain . Diarrhea . Loss of taste or smell o No - proceed to next question o Yes - Document in scheduling notes  Thank you for answering these questions. Please know we will ask you these questions or similar questions when you arrive for your appointment and again it's how we are keeping everyone safe. Also, to keep you safe, please use the provided hand sanitizer when you enter the building. (Insert pt name), we are asking everyone in the building to wear a mask because they help [place] prevent the spread of germs. Do you have a mask of your own, if not, we are happy to provide one for you. The last thing  I want to go over with you is the no visitor guidelines. This means no one can attend the appointment with you unless you need physical assistance. I understand this may be different from your past appointments and I know this may be difficult but please know if someone is driving you we are happy to call them for you once your appointment is over.  [INSERT [place]  (Insert pt name) I've given you a lot of information, what questions do you have about what I've talked about today or your appointment tomorrow?

## 2019-02-03 NOTE — Progress Notes (Signed)
Patient needs BMET for up coming CT. Placed orders for BMET per protocol.

## 2019-02-04 ENCOUNTER — Other Ambulatory Visit: Payer: Self-pay | Admitting: Family Medicine

## 2019-02-07 ENCOUNTER — Ambulatory Visit (INDEPENDENT_AMBULATORY_CARE_PROVIDER_SITE_OTHER)
Admission: RE | Admit: 2019-02-07 | Discharge: 2019-02-07 | Disposition: A | Discharge status: Home / Self Care | Payer: Medicare Other | Source: Ambulatory Visit | Attending: Cardiovascular Disease | Admitting: Cardiovascular Disease

## 2019-02-07 ENCOUNTER — Other Ambulatory Visit: Payer: Self-pay

## 2019-02-07 ENCOUNTER — Telehealth: Payer: Self-pay | Admitting: Family Medicine

## 2019-02-07 ENCOUNTER — Telehealth: Payer: Self-pay | Admitting: Cardiovascular Disease

## 2019-02-07 DIAGNOSIS — I712 Thoracic aortic aneurysm, without rupture, unspecified: Secondary | ICD-10-CM

## 2019-02-07 DIAGNOSIS — I714 Abdominal aortic aneurysm, without rupture, unspecified: Secondary | ICD-10-CM

## 2019-02-07 MED ORDER — IOHEXOL 350 MG/ML SOLN
100.0000 mL | Freq: Once | INTRAVENOUS | Status: AC | PRN
  Administered 2019-02-07: 100 mL via INTRAVENOUS

## 2019-02-07 NOTE — Telephone Encounter (Signed)
Copied from [place] [phone]. Topic: Quick Communication - Rx Refill/Question >> Feb 07, 2019  3:57 PM Selinda Flavin B, NT wrote: Medication: LORazepam (ATIVAN) 1 MG tablet   Has the patient contacted their pharmacy? Yes, states that the pharmacy contacted the office on Friday 02/03/2019 (Agent: If no, request that the patient contact the pharmacy for the refill.) (Agent: If yes, when and what did the pharmacy advise?)  Preferred Pharmacy (with phone number or street name): [facility] [place] [address] [place]: Please be advised that RX refills may take up to 3 business days. We ask that you follow-up with your pharmacy.

## 2019-02-07 NOTE — Telephone Encounter (Signed)
Copied from [place] [id number]. Topic: Quick Communication - See Telephone Encounter >> Feb 03, 2019  4:12 PM Erick Blinks wrote: CRM for notification. See Telephone encounter for: 02/02/19. Refill request

## 2019-02-07 NOTE — Telephone Encounter (Signed)
M[place], no smartphone, consent (verbal), pre reg complete 02/07/19 AF

## 2019-02-08 ENCOUNTER — Encounter: Payer: Self-pay | Admitting: Cardiovascular Disease

## 2019-02-08 ENCOUNTER — Telehealth (INDEPENDENT_AMBULATORY_CARE_PROVIDER_SITE_OTHER): Payer: Medicare Other | Admitting: Cardiovascular Disease

## 2019-02-08 VITALS — BP 127/88 | HR 62 | Ht 69.0 in | Wt 215.0 lb

## 2019-02-08 DIAGNOSIS — I495 Sick sinus syndrome: Secondary | ICD-10-CM | POA: Diagnosis not present

## 2019-02-08 DIAGNOSIS — I712 Thoracic aortic aneurysm, without rupture, unspecified: Secondary | ICD-10-CM

## 2019-02-08 DIAGNOSIS — I453 Trifascicular block: Secondary | ICD-10-CM

## 2019-02-08 DIAGNOSIS — Z950 Presence of cardiac pacemaker: Secondary | ICD-10-CM

## 2019-02-08 DIAGNOSIS — I2510 Atherosclerotic heart disease of native coronary artery without angina pectoris: Secondary | ICD-10-CM | POA: Diagnosis not present

## 2019-02-08 DIAGNOSIS — I10 Essential (primary) hypertension: Secondary | ICD-10-CM

## 2019-02-08 DIAGNOSIS — I714 Abdominal aortic aneurysm, without rupture, unspecified: Secondary | ICD-10-CM

## 2019-02-08 DIAGNOSIS — E7800 Pure hypercholesterolemia, unspecified: Secondary | ICD-10-CM

## 2019-02-08 MED ORDER — METOPROLOL TARTRATE 25 MG PO TABS: 25.0000 mg | ORAL_TABLET | Freq: Two times a day (BID) | ORAL | 3 refills | Status: DC

## 2019-02-08 NOTE — Progress Notes (Signed)
Virtual Visit via Telephone Note   This visit type was conducted due to national recommendations for restrictions regarding the COVID-19 Pandemic (e.g. social distancing) in an effort to limit this patient's exposure and mitigate transmission in our community.  Due to his co-morbid illnesses, this patient is at least at moderate risk for complications without adequate follow up.  This format is felt to be most appropriate for this patient at this time.  The patient did not have access to video technology/had technical difficulties with video requiring transitioning to audio format only (telephone).  All issues noted in this document were discussed and addressed.  No physical exam could be performed with this format.  Please refer to the patient's chart for his  consent to telehealth for [facility].   Date:  02/08/2019   ID:  Tony [name], DOB 04-28-1941, MRN [id number]  Patient Location: Home Provider Location: Home  PCP:  [name], [name], MD  Cardiologist:  Sanda Klein, MD  Electrophysiologist:  None   Evaluation Performed:  Follow-Up Visit  Chief Complaint: Aortic aneurysm follow-up, pacemaker check  History of Present Illness:    Matej Sappenfield is a 78 y.o. 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  He has advanced prostate cancer with previous external beam radiation completed in 2015, that showed biochemical evidence of relapse in 2017, is androgen deprivation resistant and has been taking Zytiga and prednisone for a long time.  He experienced some improvement in his fatigue after pacemaker implantation.  Recently has developed  fatigue and dyspnea and notices that it is difficult to climb stairs without stopping to catch his breath and it's hard to get in and out of a chair without rocking himself back and forth.  He takes prednisone, together with Zytiga for his advanced prostate cancer.  He denies angina, dizziness, syncope, edema, focal neurologic complaints, intermittent claudication, abdominal or chest pain, falls or injuries or bleeding.  He just completed a CT angiogram of the chest abdomen and pelvis that shows no change in the sizes of the ascending aortic aneurysm (4.2 cm) or infrarenal abdominal aortic aneurysm (3.7 cm).  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  Pacemaker download shows normal device function with generator longevity estimated at 13 years, excellent lead parameters, 80% atrial pacing, 0.4% ventricular pacing, constant activity at greater than 2 hours/day, appropriate heart rate histogram distribution, no episodes of mode switch or high ventricular rates.  The patient does not have symptoms concerning for COVID-19 infection (fever, chills, cough, or new shortness of breath).    Past Medical History:  Diagnosis Date  . Gastric outlet obstruction 02/03/2018   [name] 02/03/2018  . GERD (gastroesophageal reflux disease)   . History of hiatal hernia    small/notes 02/03/2018  . Hypertension    no meds  . Presence of permanent cardiac pacemaker   . Prostate cancer ([place]) 02/08/14   Gleason 4+5=9, PSA 15.65  . Radiation   . Sinus problem   . SSS (sick sinus syndrome) [facility])    Past Surgical History:  Procedure Laterality Date  . APPENDECTOMY  2019   hx hematoma s/p appendectomy  . ESOPHAGOGASTRODUODENOSCOPY (EGD)  WITH PROPOFOL N/A 02/03/2018   Procedure: ESOPHAGOGASTRODUODENOSCOPY (EGD) WITH PROPOFOL;  Surgeon: Clarene Essex, MD;  Location: [place];  Service: Endoscopy;  Laterality: N/A;  . FRACTURE SURGERY    . INSERT / REPLACE / REMOVE PACEMAKER  06/14/2018  . LAPAROSCOPIC APPENDECTOMY N/A 01/21/2018   Procedure: APPENDECTOMY LAPAROSCOPIC;  Surgeon: Kinsinger, Arta Bruce, MD;  Location: [place];  Service: General;  Laterality: N/A;  . PACEMAKER IMPLANT N/A 06/14/2018   Procedure: PACEMAKER IMPLANT - Dual Chamber;  Surgeon: Sanda Klein, MD;  Location: [place] [facility];  Service: Cardiovascular;  Laterality: N/A;  . PROSTATE BIOPSY  02/08/14   gleason 4+5=9, 12/12 cores positive, 54 gm  . RADIOACTIVE SEED IMPLANT N/A 06/29/2014   Procedure: RADIOACTIVE SEED IMPLANT;  Surgeon: Bernestine Amass, MD;  Location: [facility];  Service: Urology;  Laterality: N/A;  . WRIST FRACTURE SURGERY Right 1980s     Current Meds  Medication Sig  . abiraterone acetate (ZYTIGA) 250 MG tablet TAKE 4 TABLETS (1,000 MG TOTAL) BY MOUTH DAILY. TAKE ON AN EMPTY STOMACH 1 HOUR BEFORE OR 2 HOURS AFTER A MEAL  . acetaminophen (TYLENOL) 500 MG tablet Take 500 mg by mouth every 6 (six) hours as needed (for pain).  [name] amLODipine (NORVASC) 2.5 MG tablet Take 1 tablet (2.5 mg total) by mouth daily.  [name] aspirin EC 81 MG tablet Take 81 mg by mouth daily.  [name] atorvastatin (LIPITOR) 20 MG tablet Take 1 tablet (20 mg total) by mouth daily.  . benzonatate (TESSALON) 200 MG capsule Take 1 capsule (200 mg total) by mouth 2 (two) times daily as needed for cough.  . carbamide peroxide (DEBROX) 6.5 % OTIC solution Place 5 drops into both ears daily as needed (for earwax).  . carvedilol (COREG) 6.25 MG tablet Take 1 tablet (6.25 mg total) by mouth 2 (two) times daily with a meal.  . fluticasone (FLONASE) 50 MCG/ACT nasal spray Place 1 spray into both nostrils daily as needed for allergies or rhinitis.  [name] LORazepam (ATIVAN) 1 MG tablet Take 0.5 tablets (0.5 mg total) by mouth 2 (two) times daily as needed.  . Multiple Vitamin (MULTIVITAMIN WITH MINERALS) TABS  tablet Take 0.5 tablets by mouth every other day. Centrum  . omeprazole (PRILOSEC) 20 MG capsule Take 20 mg by mouth daily before breakfast.   . polyethylene glycol powder (GLYCOLAX/MIRALAX) powder Take 17 g by mouth daily as needed (constipation.).  [name] potassium chloride SA (K-DUR) 20 MEQ tablet TAKE 1 TABLET DAILY  . predniSONE (DELTASONE) 5 MG tablet Take 1 tablet (5 mg total) by mouth daily with breakfast.  . traZODone (DESYREL) 50 MG tablet    Current Facility-Administered Medications for the 02/08/19 encounter (Telemedicine) with Sanda Klein, MD  Medication  . ipratropium-albuterol (DUONEB) 0.5-2.5 (3) MG/3ML nebulizer solution 3 mL     Allergies:   Penicillins   Social History   Tobacco Use  . Smoking status: Former Smoker    Years: 60.00    Types: Cigars    Last attempt to quit: 09/14/2012    Years since quitting: 6.4  . Smokeless tobacco: Never Used  . Tobacco comment: little cigars; the small ones"  Substance Use Topics  . Alcohol use: Yes    Alcohol/week: 14.0 standard drinks    Types: 14 Cans of beer per week  . Drug use: No     Family Hx: The patient's family history includes Alzheimer's disease in his father and mother; Arthritis in his sister; Cancer in  his brother and father.  ROS:   Please see the history of present illness.     All other systems reviewed and are negative.   Prior CV studies:   The following studies were reviewed today:  Comprehensive pacemaker download, CT angiogram of the chest, abdomen and pelvis with contrast for aortic aneurysms  Labs/Other Tests and Data Reviewed:    EKG:  An ECG dated 06/15/2018 was personally reviewed today and demonstrated:  Atrial paced, ventricular sensed rhythm, right bundle branch block with left axis deviation not meeting criteria for anterior fascicular block, inferolateral ST-T-segment changes  Recent Labs: 02/18/2018: Magnesium 2.2 12/14/2018: ALT 14; Hemoglobin 16.0; Platelet Count 169 02/03/2019: BUN  13; Creatinine, Ser 0.89; Potassium 4.3; Sodium 139   Recent Lipid Panel Lab Results  Component Value Date/Time   CHOL 194 06/15/2018 02:22 AM   TRIG 82 06/15/2018 02:22 AM   HDL 53 06/15/2018 02:22 AM   CHOLHDL 3.7 06/15/2018 02:22 AM   LDLCALC 125 (H) 06/15/2018 02:22 AM    Wt Readings from Last 3 Encounters:  02/08/19 215 lb (97.5 kg)  12/16/18 216 lb 14.4 oz (98.4 kg)  12/02/18 212 lb (96.2 kg)     Objective:    Vital Signs:  BP 127/88   Pulse 62   Ht 5\' 9"  (1.753 m)   Wt 215 lb (97.5 kg)   BMI 31.75 kg/m    VITAL SIGNS:  reviewed Unable to examine  ASSESSMENT & PLAN:    1. SSS: Heart rate histogram distribution is appropriate and I do not think we can attribute his shortness of breath or fatigue to chronotropic incompetence.His fatigue could well be related to chronic androgen deprivation therapy. 2. PM: Normal pacemaker function.  Continue remote downloads every 3 months and yearly office visits. 3. CAD: He does not have angina pectoris.  His nuclear stress test in 2017 was a low risk study that showed just a small area of ischemia.  He is on carvedilol and amlodipine. 4. COPD: Although he quit smoking 5 years ago, he has over 50-pack-year history of smoking.  I suspect this is the major reason for his dyspnea.  He is currently receiving prednisone.  I wonder whether this might be responsible for some proximal muscle fatigue, explaining his difficulty getting in and out of a chair.  He is not taking long-acting bronchodilator inhalers and this is a treatment consideration.  We will switch from carvedilol to to a more selective beta-blocker (metoprolol) to see if this helps with his breathing 5. Ao aneurysm: Needs of the thoracic nor the abdominal aortic aneurysm have shown any evidence of increase in size.  They are asymptomatic. 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  8. Prostate cancer: PSA remains well suppressed on treatment with Zytiga.  May be having some side effects from prednisone.   COVID-19 Education: The signs and symptoms of COVID-19 were discussed with the patient and how to seek care for testing (follow up with PCP or arrange E-visit).  The importance of social distancing was discussed today.  Time:   Today, I have spent 22 minutes with the patient with telehealth technology discussing the above problems.     Medication Adjustments/Labs and Tests Ordered: Current medicines are reviewed at length with the patient today.  Concerns regarding medicines are outlined above.   Tests Ordered: No orders of the defined types were placed in this encounter.  Medication Changes: No orders of the defined types were placed in this encounter.  Patient Instructions  Medication Instructions:  Stop taking Carvedilol Start taking Metoprolol 25 mg twice a day Prescription sent to [facility] If you need a refill on your cardiac medications before your next appointment, please call your pharmacy.   Lab work: Bmet to be done 1 week before Ct of chest and abdomen ct's You can have done at office [facility] no appointment needed Lab opens 8:00 am to 12:00 noon or 2:00 pm to 4:00 pm You may eat   Testing/Procedures: Chest Ct and Abdomen Ct to be scheduled in 1 year  Scheduler will call with appointments  Follow-Up: At [facility], you and your health needs are our priority.  As part of our continuing mission to provide you with exceptional heart care, we have created designated Provider Care Teams.  These Care Teams include your primary Cardiologist (physician) and Advanced Practice Providers (APPs -  Physician Assistants and Nurse Practitioners) who all work together to provide you with the care you need, when you need it. . Schedule follow up appointment in 1 year   Call 3 months before to schedule      Disposition:  Follow up 12 months   Signed, Sanda Klein, MD  02/08/2019 3:28 PM    [place]

## 2019-02-08 NOTE — Telephone Encounter (Signed)
Rx was sent. ?Thanks, ?BJ ?

## 2019-02-08 NOTE — Telephone Encounter (Signed)
Pt called about status of refill. Pt has been out of meds since Friday/ please advise

## 2019-02-08 NOTE — Patient Instructions (Signed)
Medication Instructions:  Stop taking Carvedilol Start taking Metoprolol 25 mg twice a day Prescription sent to [facility] If you need a refill on your cardiac medications before your next appointment, please call your pharmacy.   Lab work: Bmet to be done 1 week before Ct of chest and abdomen ct's You can have done at office [facility] no appointment needed Lab opens 8:00 am to 12:00 noon or 2:00 pm to 4:00 pm You may eat   Testing/Procedures: Chest Ct and Abdomen Ct to be scheduled in 1 year  Scheduler will call with appointments  Follow-Up: At [facility], you and your health needs are our priority.  As part of our continuing mission to provide you with exceptional heart care, we have created designated Provider Care Teams.  These Care Teams include your primary Cardiologist (physician) and Advanced Practice Providers (APPs -  Physician Assistants and Nurse Practitioners) who all work together to provide you with the care you need, when you need it. . Schedule follow up appointment in 1 year   Call 3 months before to schedule

## 2019-02-10 ENCOUNTER — Encounter: Payer: Self-pay | Admitting: Cardiovascular Disease

## 2019-02-13 ENCOUNTER — Other Ambulatory Visit: Payer: Self-pay

## 2019-02-13 ENCOUNTER — Encounter: Payer: Self-pay | Admitting: Family Medicine

## 2019-02-13 ENCOUNTER — Ambulatory Visit (INDEPENDENT_AMBULATORY_CARE_PROVIDER_SITE_OTHER): Payer: Medicare Other | Admitting: Family Medicine

## 2019-02-13 ENCOUNTER — Ambulatory Visit (INDEPENDENT_AMBULATORY_CARE_PROVIDER_SITE_OTHER): Payer: Medicare Other

## 2019-02-13 ENCOUNTER — Telehealth: Payer: Self-pay | Admitting: *Deleted

## 2019-02-13 VITALS — BP 130/80 | HR 78 | Temp 98.2°F | Resp 20 | Ht 69.0 in | Wt 234.4 lb

## 2019-02-13 DIAGNOSIS — Z23 Encounter for immunization: Secondary | ICD-10-CM

## 2019-02-13 DIAGNOSIS — R739 Hyperglycemia, unspecified: Secondary | ICD-10-CM | POA: Diagnosis not present

## 2019-02-13 DIAGNOSIS — J439 Emphysema, unspecified: Secondary | ICD-10-CM

## 2019-02-13 DIAGNOSIS — IMO0001 Reserved for inherently not codable concepts without codable children: Secondary | ICD-10-CM

## 2019-02-13 DIAGNOSIS — S8991XA Unspecified injury of right lower leg, initial encounter: Secondary | ICD-10-CM | POA: Diagnosis not present

## 2019-02-13 DIAGNOSIS — S3692XA Contusion of unspecified intra-abdominal organ, initial encounter: Secondary | ICD-10-CM | POA: Diagnosis not present

## 2019-02-13 DIAGNOSIS — M25561 Pain in right knee: Secondary | ICD-10-CM | POA: Diagnosis not present

## 2019-02-13 DIAGNOSIS — F418 Other specified anxiety disorders: Secondary | ICD-10-CM

## 2019-02-13 DIAGNOSIS — S61412A Laceration without foreign body of left hand, initial encounter: Secondary | ICD-10-CM

## 2019-02-13 LAB — CBC
HCT: 40.6 % (ref 39.0–52.0)
Hemoglobin: 13.8 g/dL (ref 13.0–17.0)
MCHC: 33.9 g/dL (ref 30.0–36.0)
MCV: 94.8 fl (ref 78.0–100.0)
Platelets: 158 10*3/uL (ref 150.0–400.0)
RBC: 4.28 Mil/uL (ref 4.22–5.81)
RDW: 13.5 % (ref 11.5–15.5)
WBC: 6.7 10*3/uL (ref 4.0–10.5)

## 2019-02-13 LAB — HEMOGLOBIN A1C: Hgb A1c MFr Bld: 6.2 % (ref 4.6–6.5)

## 2019-02-13 MED ORDER — DICLOFENAC SODIUM 1 % TD GEL: 4.0000 g | Freq: Four times a day (QID) | TRANSDERMAL | 3 refills | Status: DC

## 2019-02-13 MED ORDER — CITALOPRAM HYDROBROMIDE 10 MG PO TABS: 10.0000 mg | ORAL_TABLET | Freq: Every day | ORAL | 1 refills | Status: DC

## 2019-02-13 MED ORDER — ALBUTEROL SULFATE HFA 108 (90 BASE) MCG/ACT IN AERS: 2.0000 | INHALATION_SPRAY | Freq: Four times a day (QID) | RESPIRATORY_TRACT | 0 refills | Status: DC | PRN

## 2019-02-13 MED ORDER — FLUTICASONE-SALMETEROL 100-50 MCG/DOSE IN AEPB: 1.0000 | INHALATION_SPRAY | Freq: Two times a day (BID) | RESPIRATORY_TRACT | 3 refills | Status: DC

## 2019-02-13 NOTE — Assessment & Plan Note (Signed)
Symptomatic but he is not in acute respiratory distress at this time. Albuterol inh 2 puff every 6 hours for a week then as needed for wheezing or shortness of breath.  Advair 100-50 mcg twice daily recommended. Instructed about warning signs. Follow-up in 4 weeks.

## 2019-02-13 NOTE — Patient Instructions (Signed)
A few things to remember from today's visit:   Injury of right knee, initial encounter - Plan: DG Knee Complete 4 Views Right  Hyperglycemia - Plan: Hemoglobin A1c  Abdominal hematoma, initial encounter - Plan: CBC  Pulmonary emphysema, unspecified emphysema type ([place]) - Plan: albuterol (VENTOLIN HFA) 108 (90 Base) MCG/ACT inhaler, Fluticasone-Salmeterol (ADVAIR) 100-50 MCG/DOSE AEPB  Other specified anxiety disorders - Plan: citalopram (CELEXA) 10 MG tablet  Celexa 10 mg added today.  Please be sure medication list is accurate. If a new problem present, please set up appointment sooner than planned today.

## 2019-02-13 NOTE — Telephone Encounter (Signed)
Patient scheduled for today at 10 am. Nothing further needed at this time.

## 2019-02-13 NOTE — Telephone Encounter (Signed)
Copied from [place] [id number]. Topic: Appointment Scheduling - Prior Auth Required for Appointment >> Feb 13, 2019  7:35 AM Robina Ade, Helene Kelp D wrote: No appointment has been scheduled. Patient is requesting same day appointment due to he fell and hurt his right knee. Per scheduling protocol, this appointment requires a prior authorization prior to scheduling.  Route to department's PEC pool. Call back patient please, patient feel Saturday and hurt right knee.

## 2019-02-13 NOTE — Progress Notes (Signed)
ACUTE VISIT   HPI:  Chief Complaint  Patient presents with  . Fall    fell on Saturday, has bruises on his stomach, bilateral leg swelling and soreness    [name] is a 78 y.o. male, who is here today with his wife complaining of right knee pain. On 02/11/2019 around 1 PM he fell while he was walking outdoors, stepping off of his lawnmower, lost balance, and fell against a lawnmower. He was able to get up without difficulty, noted right knee pain and later during the day some edema. Mild limitation of range of motion, pain is mild, exacerbated by movement and walking, alleviated by rest. . He has not noted erythema. He is not taking medication for pain.  Also small linear wound left hand still bleeding at times.  Abdominal ecchymosis. He denies abdominal pain, nausea, vomiting, changes in bowel habits, gross hematuria, blood in stool, gum/nose bleeding. He is on Aspirin 81 mg daily for CAD.  Lab Results  Component Value Date   CREATININE 0.89 02/03/2019   BUN 13 02/03/2019   NA 139 02/03/2019   K 4.3 02/03/2019   CL 100 02/03/2019   CO2 25 02/03/2019   He is also complaining about weight gain, "swollen." He is not eating more than usual and is still active working outdoors. Following with urologist and oncologist for prostate cancer. He is on chronic Prednisone use,   A few glucose numbers have been elevated, 118-116-115.  No history of diabetes. Denies polydipsia,polyuria, or polyphagia.   Noted mild tachypnea with exertion, according to his wife he has had similar symptoms for the past 1 to 2 months but getting worse. Stated that he was told that some of your medications could be causing shortness of breath, his cardiologist adjusted some of his medications. He denies associated chest pain, palpitation, or diaphoresis. Intermittent episodes of coughing and wheezing. According to patient, imaging done recently show emphysema/COPD, he is not  using an inhaler at this time.  He denies fever, chills, sore throat, or body aches.   Review of Systems  Constitutional: Negative for appetite change, chills, fatigue and fever.  Eyes: Negative for redness and visual disturbance.  Respiratory: Negative for stridor.   Cardiovascular: Positive for leg swelling.  Gastrointestinal: Negative for abdominal pain, nausea and vomiting.  Musculoskeletal: Positive for arthralgias, gait problem and joint swelling.  Skin: Positive for wound.  Neurological: Negative for facial asymmetry and headaches.  Hematological: Bruises/bleeds easily.  Rest of ROS, see pertinent positives and negatives in HPI.   Current Outpatient Medications on File Prior to Visit  Medication Sig Dispense Refill  . abiraterone acetate (ZYTIGA) 250 MG tablet TAKE 4 TABLETS (1,000 MG TOTAL) BY MOUTH DAILY. TAKE ON AN EMPTY STOMACH 1 HOUR BEFORE OR 2 HOURS AFTER A MEAL 120 tablet 2  . acetaminophen (TYLENOL) 500 MG tablet Take 500 mg by mouth every 6 (six) hours as needed (for pain).    [name] amLODipine (NORVASC) 2.5 MG tablet Take 1 tablet (2.5 mg total) by mouth daily. 90 tablet 2  . aspirin EC 81 MG tablet Take 81 mg by mouth daily.    [name] atorvastatin (LIPITOR) 20 MG tablet Take 1 tablet (20 mg total) by mouth daily. 90 tablet 3  . benzonatate (TESSALON) 200 MG capsule Take 1 capsule (200 mg total) by mouth 2 (two) times daily as needed for cough. 60 capsule 0  . carbamide peroxide (DEBROX) 6.5 % OTIC solution Place 5 drops into  both ears daily as needed (for earwax).    . fluticasone (FLONASE) 50 MCG/ACT nasal spray Place 1 spray into both nostrils daily as needed for allergies or rhinitis.    [name] LORazepam (ATIVAN) 1 MG tablet Take 0.5 tablets (0.5 mg total) by mouth 2 (two) times daily as needed. 30 tablet 3  . metoprolol tartrate (LOPRESSOR) 25 MG tablet Take 1 tablet (25 mg total) by mouth 2 (two) times daily. 180 tablet 3  . Multiple Vitamin (MULTIVITAMIN WITH MINERALS) TABS  tablet Take 0.5 tablets by mouth every other day. Centrum    . omeprazole (PRILOSEC) 20 MG capsule Take 20 mg by mouth daily before breakfast.     . polyethylene glycol powder (GLYCOLAX/MIRALAX) powder Take 17 g by mouth daily as needed (constipation.).    [name] potassium chloride SA (K-DUR) 20 MEQ tablet TAKE 1 TABLET DAILY 90 tablet 0  . predniSONE (DELTASONE) 5 MG tablet Take 1 tablet (5 mg total) by mouth daily with breakfast. 90 tablet 1  . traZODone (DESYREL) 50 MG tablet      Current Facility-Administered Medications on File Prior to Visit  Medication Dose Route Frequency Provider Last Rate Last Dose  . ipratropium-albuterol (DUONEB) 0.5-2.5 (3) MG/3ML nebulizer solution 3 mL  3 mL Nebulization Once Dorothyann Peng, NP         Past Medical History:  Diagnosis Date  . Gastric outlet obstruction 02/03/2018   [name] 02/03/2018  . GERD (gastroesophageal reflux disease)   . History of hiatal hernia    small/notes 02/03/2018  . Hypertension    no meds  . Presence of permanent cardiac pacemaker   . Prostate cancer ([place]) 02/08/14   Gleason 4+5=9, PSA 15.65  . Radiation   . Sinus problem   . SSS (sick sinus syndrome) (HCC)    Allergies  Allergen Reactions  . Penicillins Rash    Has patient had a PCN reaction causing immediate rash, facial/tongue/throat swelling, SOB or lightheadedness with hypotension: Yes Has patient had a PCN reaction causing severe rash involving mucus membranes or skin necrosis: No Has patient had a PCN reaction that required hospitalization: No Has patient had a PCN reaction occurring within the last 10 years: No If all of the above answers are "NO", then may proceed with Cephalosporin use.    Social History   Socioeconomic History  . Marital status: Married    Spouse name: Not on file  . Number of children: 3  . Years of education: Not on file  . Highest education level: Not on file  Occupational History  . 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 Financial resource strain: Not hard at all  . Food insecurity:    Worry: Never true    Inability: Never true  . Transportation needs:    Medical: No    Non-medical: No  Tobacco Use  . Smoking status: Former Smoker    Years: 60.00    Types: Cigars    Last attempt to quit: 09/14/2012    Years since quitting: 6.4  . Smokeless tobacco: Never Used  . Tobacco comment: little cigars; the small ones"  Substance and Sexual Activity  . Alcohol use: Yes    Alcohol/week: 14.0 standard drinks    Types: 14 Cans of beer per week  . Drug use: No  . Sexual activity: Not Currently  Lifestyle  . Physical activity:  Days per week: 0 days    Minutes per session: 0 min  . Stress: Only a little  Relationships  . Social connections:    Talks on phone: More than three times a week    Gets together: More than three times a week    Attends religious service: More than 4 times per year    Active member of club or organization: No    Attends meetings of clubs or organizations: Never    Relationship status: Married  Other Topics Concern  . Not on file  Social History Narrative   Lives with wife and 2 dogs in one level home; daughter and her family co-habitate.   Has three daughters, all in [name], supportive. Five grandchildren.   Wants to return to doing silver sneakers once gym re-opens.          Vitals:   02/13/19 0948  BP: 130/80  Pulse: 78  Resp: 20  Temp: 98.2 F (36.8 C)  SpO2: 96%   Body mass index is 34.61 kg/m.   Physical Exam  Nursing note reviewed. Constitutional: He is oriented to person, place, and time. He appears well-developed. No distress.  HENT:  Head: Normocephalic and atraumatic.  Mouth/Throat: Oropharynx is clear and moist and mucous membranes are normal.  Eyes: Pupils are equal, round, and reactive to light. Conjunctivae are normal.  Cardiovascular: Normal rate and  regular rhythm.  No murmur heard. Pulses:      Dorsalis pedis pulses are 2+ on the right side and 2+ on the left side.  Respiratory: Effort normal and breath sounds normal. No respiratory distress.  GI: Soft. He exhibits no mass. There is no abdominal tenderness.  Distended abdomen but no pain upon palpation. Abdominal wall hematomas noted.  Musculoskeletal:        General: Edema (2+pitting LE edema,bilateral) present.     Right knee: He exhibits decreased range of motion and effusion. He exhibits no deformity, no erythema and no LCL laxity. Tenderness found. Lateral joint line tenderness noted.       Hands:     Comments: Knee: Valgus and varus stress normal, anterior and posterior drawer test negative. Patellar apprehension test negative. Antalgic gait, assisted with a cane.  Neurological: He is alert and oriented to person, place, and time. He has normal strength. No cranial nerve deficit.  Skin: Skin is warm. Ecchymosis (See picture) noted. No rash noted. No erythema.     Psychiatric: He has a normal mood and affect.  Well groomed, good eye contact.         ASSESSMENT AND PLAN:  [name] was seen today for fall.  Diagnoses and all orders for this visit:  Injury of right knee, initial encounter Most likely sprain knee. Further recommendation will be given according to imaging results. Topical diclofenac recommended. Rest and elevation ofRLE. Fall precautions discussed.  -     DG Knee Complete 4 Views Right; Future -     diclofenac sodium (VOLTAREN) 1 % GEL; Apply 4 g topically 4 (four) times daily.  Hyperglycemia Further recommendation will be given according to A1c. We discussed some side effects of chronic prednisone intake.  -     Hemoglobin A1c  Abdominal hematoma, initial encounter He is not having abdominal pain, so for now I do not think abdominal imaging is needed at this time. We will continue monitoring. Instructed about warning signs.  -     CBC  Emphysema lung (HCC) Symptomatic but he is not in acute  respiratory distress at this time. Albuterol inh 2 puff every 6 hours for a week then as needed for wheezing or shortness of breath.  Advair 100-50 mcg twice daily recommended. Instructed about warning signs. Follow-up in 4 weeks.  Anxiety disorder Problem is getting worse. After discussion of other pharmacologic options, he agrees with trying Celexa 10 mg daily. Some side effect discussed. No changes in alprazolam. Follow-up in 4 weeks.  Need for Td vaccine -     Td : Tetanus/diphtheria >7yo Preservative  free  Laceration of left hand without foreign body, initial encounter Area was clean, bandage placed. Recommend applying pressure intermittently if bleeding is noted. Keep wound clean with soap and water. Monitor for signs of infection. TD vaccine given today.   Return in about 4 weeks (around 03/13/2019) for Anxiety .    -[name] was advised to seek immediate medical attention if sudden worsening symptoms.      Tony Moure G. [name], MD  [facility] [place] office.

## 2019-02-13 NOTE — Assessment & Plan Note (Signed)
Problem is getting worse. After discussion of other pharmacologic options, he agrees with trying Celexa 10 mg daily. Some side effect discussed. No changes in alprazolam. Follow-up in 4 weeks.

## 2019-02-17 ENCOUNTER — Other Ambulatory Visit: Payer: Self-pay | Admitting: *Deleted

## 2019-02-17 MED ORDER — METOPROLOL TARTRATE 25 MG PO TABS: 12.5000 mg | ORAL_TABLET | Freq: Two times a day (BID) | ORAL | 3 refills | Status: DC

## 2019-02-20 ENCOUNTER — Encounter: Payer: Self-pay | Admitting: Family Medicine

## 2019-02-20 ENCOUNTER — Telehealth: Payer: Self-pay

## 2019-02-20 NOTE — Telephone Encounter (Signed)
Recent labs were otherwise normal. Some of his medications can contribute to weight gain. He can try furosemide 20 mg daily for 5 days then daily as needed for lower extremity edema.  Diuretics can increase the risk of electrolyte abnormalities as well as dehydration. Increase potassium rich food intake.  Recommend following with cardiologist if edema is persistent and seek immediate medical attention if dyspnea, chest pain, or decreased urine output. Thanks, BJ

## 2019-02-20 NOTE — Telephone Encounter (Signed)
Message sent to Dr. [name] for review and approval. 

## 2019-02-20 NOTE — Telephone Encounter (Signed)
Copied from [facility] [id number]. Topic: General - Inquiry >> Feb 20, 2019 10:07 AM Virl Axe D wrote: Reason for CRM: Pt's wife called regarding pt's weight loss. She stated pt went from 216 to 234 to current weight of 227. They wondered if pt needs to be on something for fluid. Please advise.

## 2019-02-21 ENCOUNTER — Other Ambulatory Visit: Payer: Self-pay | Admitting: *Deleted

## 2019-02-21 ENCOUNTER — Telehealth: Payer: Self-pay | Admitting: *Deleted

## 2019-02-21 MED ORDER — FUROSEMIDE 20 MG PO TABS: ORAL_TABLET | ORAL | 0 refills | Status: DC

## 2019-02-21 NOTE — Telephone Encounter (Signed)
Spoke with Mrs. Weniger and gave directions per Dr. [name]. Mrs. Decaire verbalized understanding. Rx for Furosemide 20 mg daily sent to the pharmacy.

## 2019-02-21 NOTE — Telephone Encounter (Signed)
Spoke with Mrs. Mabe and gave directions per Dr. [name]. Mrs. Diel verbalized understanding. Rx for Furosemide 20 mg daily sent to the pharmacy.

## 2019-02-21 NOTE — Telephone Encounter (Signed)
Copied from [facility] [id number]. Topic: General - Other >> Feb 21, 2019  7:59 AM Keene Breath wrote: Reason for CRM: Patient wife called to ask if the nurse can call back regarding some swelling the patient is having.  She stated that he does not feel bad but believes he needs to relieve some fluid.  Wife would like for the doctor to call in a script to relieve the fluid.  Please call patient back and advise.  CB# [phone].

## 2019-03-02 ENCOUNTER — Telehealth: Payer: Self-pay | Admitting: Oncology

## 2019-03-02 NOTE — Telephone Encounter (Signed)
Returned call re moving 6/30 lab to 6/29. Spoke with spouse re lab 6/29 @ 8:15 am.

## 2019-03-06 MED FILL — ABIRATERONE ACETATE 250 MG: 250 | 30 days supply | Qty: 120 | Fill #1

## 2019-03-09 ENCOUNTER — Other Ambulatory Visit: Payer: Self-pay | Admitting: Family Medicine

## 2019-03-09 DIAGNOSIS — J439 Emphysema, unspecified: Secondary | ICD-10-CM

## 2019-03-12 NOTE — Progress Notes (Signed)
HPI:   Tony [name] is a 78 y.o. male, who is here today to follow on recent OV.  He was seen on 02/13/19 because knee injury,worsening COPD symptoms,and anxiety.  Wife was also concerned about wt gain and edema. Right knee pain and edema after fall. Knee X ray 02/13/19 negative for acute abnormalities. Knee pain has improved and abdominal ecchymosis resolved.  02/16/19 he was started on Furosemide 20 mg bid x 5 days and then 20 mg daily as needed. He is taking Furosemide 20 mgh every other day. He is tolerating medication well.  Still having bilateral LE edema but greatly improved. It is worse at the end of the day and not present in th morning when he gets up. He has not noted skin erythema or LE pain. No orthopnea or PND.  Negative for fever,chills, unusual fatigue, or changes in appetite.  Hx of prostate cancer,follows with urologist. He is on Prednisone and Lutron every 3 months.   Lab Results  Component Value Date   CREATININE 1.07 03/13/2019   BUN 14 03/13/2019   NA 142 03/13/2019   K 3.8 03/13/2019   CL 103 03/13/2019   CO2 26 03/13/2019   Anxiety and insomnia: He is on Lorazepam 1 mg 1/2 tab bid as needed. Celexa 10 mg started on 02/13/19 because anxiety was getting worse. He has tolerated medication well. Sleeping better,he has not taken Trazodone in a few days.  According to his wife,anxiety has greatly improved. No depressed mood or suicidal thoughts.  HTN, he is on Amlodipine 2.5 mg daily and Metoprolol 25 mg bid.  He is not checking BP at home but it has been "good" during OV's.  Denies severe/frequent headache, visual changes, chest pain, dyspnea, palpitation, claudication,or focal weakness.   HypoK+, he is on K-DUR 20 meq daily.  COPD/enphysema:Exertional dyspnea has improved. Last OV Advair 100-50 mcg bid was added. He has not needed Albuterol inh since his last visit. No side effects reported. Negative for sore throat,cough or  wheezing.   Review of Systems  Constitutional: Negative for appetite change, chills and fever.  HENT: Negative for mouth sores and sore throat.   Gastrointestinal: Negative for abdominal pain, nausea and vomiting.  Genitourinary: Negative for decreased urine volume and dysuria.  Musculoskeletal: Positive for arthralgias. Negative for gait problem.  Skin: Negative for pallor and rash.  Neurological: Negative for facial asymmetry and speech difficulty.  Psychiatric/Behavioral: Negative for confusion and suicidal ideas.  Rest see pertinent positives and negatives per HPI.   Current Outpatient Medications on File Prior to Visit  Medication Sig Dispense Refill  . abiraterone acetate (ZYTIGA) 250 MG tablet TAKE 4 TABLETS (1,000 MG TOTAL) BY MOUTH DAILY. TAKE ON AN EMPTY STOMACH 1 HOUR BEFORE OR 2 HOURS AFTER A MEAL 120 tablet 2  . acetaminophen (TYLENOL) 500 MG tablet Take 500 mg by mouth every 6 (six) hours as needed (for pain).    [name] albuterol (VENTOLIN HFA) 108 (90 Base) MCG/ACT inhaler Inhale 2 puffs into the lungs every 6 (six) hours as needed for wheezing or shortness of breath. 8.5 g 0  . aspirin EC 81 MG tablet Take 81 mg by mouth daily.    [name] atorvastatin (LIPITOR) 20 MG tablet Take 1 tablet (20 mg total) by mouth daily. 90 tablet 3  . carbamide peroxide (DEBROX) 6.5 % OTIC solution Place 5 drops into both ears daily as needed (for earwax).    . diclofenac sodium (VOLTAREN) 1 % GEL  Apply 4 g topically 4 (four) times daily. 4 Tube 3  . fluticasone (FLONASE) 50 MCG/ACT nasal spray Place 1 spray into both nostrils daily as needed for allergies or rhinitis.    [name] LORazepam (ATIVAN) 1 MG tablet Take 0.5 tablets (0.5 mg total) by mouth 2 (two) times daily as needed. 30 tablet 3  . metoprolol tartrate (LOPRESSOR) 25 MG tablet Take 0.5 tablets (12.5 mg total) by mouth 2 (two) times daily. 180 tablet 3  . Multiple Vitamin (MULTIVITAMIN WITH MINERALS) TABS tablet Take 0.5 tablets by mouth every  other day. Centrum    . omeprazole (PRILOSEC) 20 MG capsule Take 20 mg by mouth daily before breakfast.     . polyethylene glycol powder (GLYCOLAX/MIRALAX) powder Take 17 g by mouth daily as needed (constipation.).    [name] potassium chloride SA (K-DUR) 20 MEQ tablet TAKE 1 TABLET DAILY 90 tablet 0  . predniSONE (DELTASONE) 5 MG tablet Take 1 tablet (5 mg total) by mouth daily with breakfast. 90 tablet 1   No current facility-administered medications on file prior to visit.      Past Medical History:  Diagnosis Date  . Gastric outlet obstruction 02/03/2018   [name] 02/03/2018  . GERD (gastroesophageal reflux disease)   . History of hiatal hernia    small/notes 02/03/2018  . Hypertension    no meds  . Presence of permanent cardiac pacemaker   . Prostate cancer ([place]) 02/08/14   Gleason 4+5=9, PSA 15.65  . Radiation   . Sinus problem   . SSS (sick sinus syndrome) (HCC)    Allergies  Allergen Reactions  . Penicillins Rash    Has patient had a PCN reaction causing immediate rash, facial/tongue/throat swelling, SOB or lightheadedness with hypotension: Yes Has patient had a PCN reaction causing severe rash involving mucus membranes or skin necrosis: No Has patient had a PCN reaction that required hospitalization: No Has patient had a PCN reaction occurring within the last 10 years: No If all of the above answers are "NO", then may proceed with Cephalosporin use.    Social History   Socioeconomic History  . Marital status: Married    Spouse name: Not on file  . Number of children: 3  . Years of education: Not on file  . Highest education level: Not on file  Occupational History  . 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 Financial resource strain: Not hard at all  . Food insecurity    Worry: Never true    Inability: Never true  . Transportation needs    Medical: No     Non-medical: No  Tobacco Use  . Smoking status: Former Smoker    Years: 60.00    Types: Cigars    Quit date: 09/14/2012    Years since quitting: 6.5  . Smokeless tobacco: Never Used  . Tobacco comment: little cigars; the small ones"  Substance and Sexual Activity  . Alcohol use: Yes    Alcohol/week: 14.0 standard drinks    Types: 14 Cans of beer per week  . Drug use: No  . Sexual activity: Not Currently  Lifestyle  . Physical activity    Days per week: 0 days    Minutes per session: 0 min  . Stress: Only a little  Relationships  . Social connections    Talks on phone: More than three times a week  Gets together: More than three times a week    Attends religious service: More than 4 times per year    Active member of club or organization: No    Attends meetings of clubs or organizations: Never    Relationship status: Married  Other Topics Concern  . Not on file  Social History Narrative   Lives with wife and 2 dogs in one level home; daughter and her family co-habitate.   Has three daughters, all in [place], supportive. Five grandchildren.   Wants to return to doing silver sneakers once gym re-opens.          Vitals:   03/13/19 0943  BP: 126/84  Pulse: 73  Resp: 16  Temp: 97.7 F (36.5 C)  SpO2: 95%   Body mass index is 32.4 kg/m.  Wt Readings from Last 3 Encounters:  03/16/19 220 lb 3.2 oz (99.9 kg)  03/13/19 219 lb 6 oz (99.5 kg)  02/13/19 234 lb 6 oz (106.3 kg)    Physical Exam  Nursing note reviewed. Constitutional: He is oriented to person, place, and time. He appears well-developed. No distress.  HENT:  Head: Normocephalic and atraumatic.  Mouth/Throat: Oropharynx is clear and moist and mucous membranes are normal.  Eyes: Pupils are equal, round, and reactive to light. Conjunctivae are normal.  Cardiovascular: Normal rate and regular rhythm.  No murmur heard. Pulses:      Posterior tibial pulses are 2+ on the right side and 2+ on the left side.   Respiratory: Effort normal and breath sounds normal. No respiratory distress.  GI: Soft. He exhibits no mass. There is no hepatomegaly. There is no abdominal tenderness.  Musculoskeletal:        General: Edema (1+ pitting LE edema, bilateral.) present.  Lymphadenopathy:    He has no cervical adenopathy.  Neurological: He is alert and oriented to person, place, and time. He has normal strength.  Skin: Skin is warm. No rash noted. No erythema.  Psychiatric: He has a normal mood and affect.  Well groomed, good eye contact.    ASSESSMENT AND PLAN:  [name] was seen today for chronic disease management.  Diagnoses and all orders for this visit:  Insomnia Problem is better controlled since Celexa was added. Trazodone discontinued. Good sleep hygiene. Follow-up in 6 months.  Anxiety disorder Greatly improved. Continue Celexa 10 mg daily lorazepam 1 mg 0.5 tablet twice daily as needed. We discussed some side effects of medications. Fall precautions discussed. Follow-up in 6 months, before if needed.  Emphysema lung ([place]) Improved with Albert 100-50 mcg twice daily, so no changes in dose.  Instructed to rinse mouth after use. Continue albuterol inhaler every 6 hours as needed. Instructed about warning signs. Follow-up in 6 months.   Bilateral lower extremity edema Improved. Continue furosemide 20 mg daily as needed. Side effects of diuretic treatment discussed. Lower extremity elevation above waist level a few times in the afternoon/or compression stockings recommended. Adequate skin care.  Hypokalemia No changes in K-Lor 20 mEq daily. Further recommendation will be given according to BMP results, lab was done earlier today at his oncologist office.   Return in about 6 months (around 09/12/2019) for F/U.    Janet Decesare G. [name], MD  [facility]. [place] office.

## 2019-03-13 ENCOUNTER — Inpatient Hospital Stay: Payer: Medicare Other | Attending: Oncology

## 2019-03-13 ENCOUNTER — Other Ambulatory Visit: Payer: Self-pay

## 2019-03-13 ENCOUNTER — Encounter: Payer: Self-pay | Admitting: Family Medicine

## 2019-03-13 ENCOUNTER — Ambulatory Visit (INDEPENDENT_AMBULATORY_CARE_PROVIDER_SITE_OTHER): Payer: Medicare Other | Admitting: Family Medicine

## 2019-03-13 VITALS — BP 126/84 | HR 73 | Temp 97.7°F | Resp 16 | Ht 69.0 in | Wt 219.4 lb

## 2019-03-13 DIAGNOSIS — G4700 Insomnia, unspecified: Secondary | ICD-10-CM

## 2019-03-13 DIAGNOSIS — F418 Other specified anxiety disorders: Secondary | ICD-10-CM

## 2019-03-13 DIAGNOSIS — I10 Essential (primary) hypertension: Secondary | ICD-10-CM

## 2019-03-13 DIAGNOSIS — C61 Malignant neoplasm of prostate: Secondary | ICD-10-CM

## 2019-03-13 DIAGNOSIS — R600 Localized edema: Secondary | ICD-10-CM | POA: Diagnosis not present

## 2019-03-13 DIAGNOSIS — J439 Emphysema, unspecified: Secondary | ICD-10-CM

## 2019-03-13 DIAGNOSIS — E876 Hypokalemia: Secondary | ICD-10-CM | POA: Insufficient documentation

## 2019-03-13 LAB — CMP (CANCER CENTER ONLY)
ALT: 16 U/L (ref 0–44)
AST: 21 U/L (ref 15–41)
Albumin: 4.4 g/dL (ref 3.5–5.0)
Alkaline Phosphatase: 96 U/L (ref 38–126)
Anion gap: 13 (ref 5–15)
BUN: 14 mg/dL (ref 8–23)
CO2: 26 mmol/L (ref 22–32)
Calcium: 9.4 mg/dL (ref 8.9–10.3)
Chloride: 103 mmol/L (ref 98–111)
Creatinine: 1.07 mg/dL (ref 0.61–1.24)
GFR, Est AFR Am: >60 mL/min (ref >60)
GFR, Estimated: >60 mL/min (ref >60)
Glucose, Bld: 143 mg/dL — ABNORMAL HIGH (ref 70–99)
Potassium: 3.8 mmol/L (ref 3.5–5.1)
Sodium: 142 mmol/L (ref 135–145)
Total Bilirubin: 1 mg/dL (ref 0.3–1.2)
Total Protein: 7.6 g/dL (ref 6.5–8.1)

## 2019-03-13 LAB — CBC WITH DIFFERENTIAL (CANCER CENTER ONLY)
Abs Immature Granulocytes: 0.05 10*3/uL (ref 0.00–0.07)
Basophils Absolute: 0.1 10*3/uL (ref 0.0–0.1)
Basophils Relative: 1 %
Eosinophils Absolute: 0.1 10*3/uL (ref 0.0–0.5)
Eosinophils Relative: 1 %
HCT: 46.7 % (ref 39.0–52.0)
Hemoglobin: 15.4 g/dL (ref 13.0–17.0)
Immature Granulocytes: 1 %
Lymphocytes Relative: 10 %
Lymphs Abs: 1 10*3/uL (ref 0.7–4.0)
MCH: 31 pg (ref 26.0–34.0)
MCHC: 33 g/dL (ref 30.0–36.0)
MCV: 94.2 fL (ref 80.0–100.0)
Monocytes Absolute: 0.5 10*3/uL (ref 0.1–1.0)
Monocytes Relative: 6 %
Neutro Abs: 8 10*3/uL — ABNORMAL HIGH (ref 1.7–7.7)
Neutrophils Relative %: 81 %
Platelet Count: 174 10*3/uL (ref 150–400)
RBC: 4.96 MIL/uL (ref 4.22–5.81)
RDW: 12.6 % (ref 11.5–15.5)
WBC Count: 9.8 10*3/uL (ref 4.0–10.5)
nRBC: 0 % (ref 0.0–0.2)

## 2019-03-13 MED ORDER — CITALOPRAM HYDROBROMIDE 10 MG PO TABS: 10.0000 mg | ORAL_TABLET | Freq: Every day | ORAL | 2 refills | Status: DC

## 2019-03-13 MED ORDER — FLUTICASONE-SALMETEROL 100-50 MCG/DOSE IN AEPB: 1.0000 | INHALATION_SPRAY | Freq: Two times a day (BID) | RESPIRATORY_TRACT | 3 refills | Status: DC

## 2019-03-13 MED ORDER — AMLODIPINE BESYLATE 2.5 MG PO TABS: 2.5000 mg | ORAL_TABLET | Freq: Every day | ORAL | 2 refills | Status: DC

## 2019-03-13 MED ORDER — FUROSEMIDE 20 MG PO TABS: ORAL_TABLET | ORAL | 1 refills | Status: DC

## 2019-03-13 NOTE — Assessment & Plan Note (Signed)
Improved. Continue furosemide 20 mg daily as needed. Side effects of diuretic treatment discussed. Lower extremity elevation above waist level a few times in the afternoon/or compression stockings recommended. Adequate skin care.

## 2019-03-13 NOTE — Patient Instructions (Addendum)
A few things to remember from today's visit:   Essential hypertension, benign  Other specified anxiety disorders  Insomnia, unspecified type  Pulmonary emphysema, unspecified emphysema type ([place])   No changes today.   Please be sure medication list is accurate. If a new problem present, please set up appointment sooner than planned today.

## 2019-03-13 NOTE — Assessment & Plan Note (Signed)
Problem is better controlled since Celexa was added. Trazodone discontinued. Good sleep hygiene. Follow-up in 6 months.

## 2019-03-13 NOTE — Assessment & Plan Note (Signed)
Improved with Albert 100-50 mcg twice daily, so no changes in dose.  Instructed to rinse mouth after use. Continue albuterol inhaler every 6 hours as needed. Instructed about warning signs. Follow-up in 6 months.

## 2019-03-13 NOTE — Assessment & Plan Note (Signed)
No changes in K-Lor 20 mEq daily. Further recommendation will be given according to BMP results, lab was done earlier today at his oncologist office.

## 2019-03-13 NOTE — Assessment & Plan Note (Signed)
Greatly improved. Continue Celexa 10 mg daily lorazepam 1 mg 0.5 tablet twice daily as needed. We discussed some side effects of medications. Fall precautions discussed. Follow-up in 6 months, before if needed.

## 2019-03-14 ENCOUNTER — Other Ambulatory Visit: Payer: Medicare Other

## 2019-03-14 LAB — PROSTATE-SPECIFIC AG, SERUM (LABCORP): Prostate Specific Ag, Serum: <0.1 ng/mL (ref 0.0–4.0)

## 2019-03-16 ENCOUNTER — Inpatient Hospital Stay: Payer: Medicare Other

## 2019-03-16 ENCOUNTER — Other Ambulatory Visit: Payer: Self-pay

## 2019-03-16 ENCOUNTER — Telehealth: Payer: Self-pay | Admitting: Oncology

## 2019-03-16 ENCOUNTER — Inpatient Hospital Stay: Payer: Medicare Other | Attending: Oncology | Admitting: Oncology

## 2019-03-16 VITALS — BP 132/84 | HR 88 | Temp 97.5°F | Resp 18 | Ht 69.0 in | Wt 220.2 lb

## 2019-03-16 DIAGNOSIS — D751 Secondary polycythemia: Secondary | ICD-10-CM | POA: Insufficient documentation

## 2019-03-16 DIAGNOSIS — Z5111 Encounter for antineoplastic chemotherapy: Secondary | ICD-10-CM | POA: Diagnosis not present

## 2019-03-16 DIAGNOSIS — C61 Malignant neoplasm of prostate: Secondary | ICD-10-CM | POA: Diagnosis present

## 2019-03-16 DIAGNOSIS — I10 Essential (primary) hypertension: Secondary | ICD-10-CM | POA: Insufficient documentation

## 2019-03-16 DIAGNOSIS — E876 Hypokalemia: Secondary | ICD-10-CM | POA: Insufficient documentation

## 2019-03-16 MED ORDER — LEUPROLIDE ACETATE (3 MONTH) 22.5 MG IM KIT: 22.5000 mg | PACK | Freq: Once | INTRAMUSCULAR | Status: DC

## 2019-03-16 MED ORDER — LEUPROLIDE ACETATE (3 MONTH) 22.5 MG IM KIT
22.5000 mg | PACK | Freq: Once | INTRAMUSCULAR | Status: AC
  Administered 2019-03-16: 22.5 mg via INTRAMUSCULAR

## 2019-03-16 MED ORDER — LEUPROLIDE ACETATE (3 MONTH) 22.5 MG IM KIT
PACK | INTRAMUSCULAR | Status: AC
  Filled 2019-03-16: qty 22.5

## 2019-03-16 NOTE — Addendum Note (Signed)
Addended by: Wyatt Portela on: 03/16/2019 10:46 AM   Modules accepted: Orders

## 2019-03-16 NOTE — Progress Notes (Signed)
Hematology and Oncology Follow Up Visit  Tony [name] [id number] Jan 10, 1941 78 y.o. 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  He presented with Gleason score 4+5 = 9 and a PSA 15.7 in 2015.  Prior Therapy:  He is status post radiation therapy for external beam radiation completed in September 2015 for a total of 45 gray. He also status post seed implant brachytherapy boost completed in October 2015.   He received total of 1 year of androgen deprivation 22,015 and 2016. His PSA nadir was to 0.47 in November 2016. His PSA was 4.08 in May 2017.  Current therapy:  Androgen deprivation in the form of Lupron 22.5 mg resumed on 03/02/2016. This will be given every 3 months.  Zytiga 1000 mg with prednisone started in August 2017.   Interim History: Tony [name] is here for a return evaluation.  Since the last visit, he reports no major changes in his health.  He continues to tolerate Zytiga without any recent complaints.  He denies any nausea, fatigue or edema.  He denies any bone pain or pathological fractures.  Denies any hot flashes or bone pain.  His quality of life and performance status remain excellent.   He denied headaches, blurry vision, syncope or seizures.  Denies any fevers, chills or sweats.  Denied chest pain, palpitation, orthopnea or leg edema.  Denied cough, wheezing or hemoptysis.  Denied nausea, vomiting or abdominal pain.  Denies any constipation or diarrhea.  Denies any frequency urgency or hesitancy.  Denies any arthralgias or myalgias.  Denies any skin rashes or lesions.  Denies any bleeding or clotting tendency.  Denies any easy bruising.  Denies any hair or nail changes.  Denies any anxiety or depression.  Remaining review of system is negative.    Medications: I have reviewed the patient's current medications.  No changes per  my review. Current Outpatient Medications  Medication Sig Dispense Refill  . abiraterone acetate (ZYTIGA) 250 MG tablet TAKE 4 TABLETS (1,000 MG TOTAL) BY MOUTH DAILY. TAKE ON AN EMPTY STOMACH 1 HOUR BEFORE OR 2 HOURS AFTER A MEAL 120 tablet 2  . acetaminophen (TYLENOL) 500 MG tablet Take 500 mg by mouth every 6 (six) hours as needed (for pain).    [name] albuterol (VENTOLIN HFA) 108 (90 Base) MCG/ACT inhaler Inhale 2 puffs into the lungs every 6 (six) hours as needed for wheezing or shortness of breath. 8.5 [name] 0  . amLODipine (NORVASC) 2.5 MG tablet Take 1 tablet (2.5 mg total) by mouth daily. 90 tablet 2  . aspirin EC 81 MG tablet Take 81 mg by mouth daily.    [name] atorvastatin (LIPITOR) 20 MG tablet Take 1 tablet (20 mg total) by mouth daily. 90 tablet 3  . carbamide peroxide (DEBROX) 6.5 % OTIC solution Place 5 drops into both ears daily as needed (for earwax).    . citalopram (CELEXA) 10 MG tablet Take 1 tablet (10 mg total) by mouth daily. 90 tablet 2  . diclofenac sodium (VOLTAREN) 1 % GEL Apply 4 [name] topically 4 (four) times daily. 4 Tube 3  . fluticasone (FLONASE) 50 MCG/ACT nasal spray Place 1 spray into both nostrils daily as needed for allergies or rhinitis.    . Fluticasone-Salmeterol (ADVAIR) 100-50 MCG/DOSE AEPB Inhale 1 puff into the lungs 2 (two) times daily. 60 each 3  . furosemide (LASIX) 20 MG tablet Take 20 mg  by mouth daily for 5 days, then daily as needed for edema 90 tablet 1  . LORazepam (ATIVAN) 1 MG tablet Take 0.5 tablets (0.5 mg total) by mouth 2 (two) times daily as needed. 30 tablet 3  . metoprolol tartrate (LOPRESSOR) 25 MG tablet Take 0.5 tablets (12.5 mg total) by mouth 2 (two) times daily. 180 tablet 3  . Multiple Vitamin (MULTIVITAMIN WITH MINERALS) TABS tablet Take 0.5 tablets by mouth every other day. Centrum    . omeprazole (PRILOSEC) 20 MG capsule Take 20 mg by mouth daily before breakfast.     . polyethylene glycol powder (GLYCOLAX/MIRALAX) powder Take 17 [name] by mouth  daily as needed (constipation.).    [name] potassium chloride SA (K-DUR) 20 MEQ tablet TAKE 1 TABLET DAILY 90 tablet 0  . predniSONE (DELTASONE) 5 MG tablet Take 1 tablet (5 mg total) by mouth daily with breakfast. 90 tablet 1   No current facility-administered medications for this visit.      Allergies:  Allergies  Allergen Reactions  . Penicillins Rash    Has patient had a PCN reaction causing immediate rash, facial/tongue/throat swelling, SOB or lightheadedness with hypotension: Yes Has patient had a PCN reaction causing severe rash involving mucus membranes or skin necrosis: No Has patient had a PCN reaction that required hospitalization: No Has patient had a PCN reaction occurring within the last 10 years: No If all of the above answers are "NO", then may proceed with Cephalosporin use.    Past Medical History, Surgical history, Social history, and Family History were discussed and no changes noted at this time.   Physical Exam:  Blood pressure 132/84, pulse 88, temperature (!) 97.5 F (36.4 C), temperature source Oral, resp. rate 18, height 5\' 9"  (1.753 m), weight 220 lb 3.2 oz (99.9 kg), SpO2 94 %.      ECOG: 1      General appearance: Alert, awake without any distress. Head: Atraumatic without abnormalities Oropharynx: Without any thrush or ulcers. Eyes: No scleral icterus. Lymph nodes: No lymphadenopathy noted in the cervical, supraclavicular, or axillary nodes Heart:regular rate and rhythm, without any murmurs or gallops.   Lung: Clear to auscultation without any rhonchi, wheezes or dullness to percussion. Abdomin: Soft, nontender without any shifting dullness or ascites. Musculoskeletal: No clubbing or cyanosis. Neurological: No motor or sensory deficits. Skin: No rashes or lesions. Psychiatric: Mood and affect appeared normal.       Lab Results: Lab Results  Component Value Date   WBC 9.8 03/13/2019   HGB 15.4 03/13/2019   HCT 46.7 03/13/2019   MCV  94.2 03/13/2019   PLT 174 03/13/2019     Chemistry      Component Value Date/Time   NA 142 03/13/2019 0806   NA 139 02/03/2019 0857   NA 141 08/27/2017 0915   K 3.8 03/13/2019 0806   K 4.8 08/27/2017 0915   CL 103 03/13/2019 0806   CO2 26 03/13/2019 0806   CO2 26 08/27/2017 0915   BUN 14 03/13/2019 0806   BUN 13 02/03/2019 0857   BUN 16.2 08/27/2017 0915   CREATININE 1.07 03/13/2019 0806   CREATININE 0.9 08/27/2017 0915   GLU 105 01/10/2016      Component Value Date/Time   CALCIUM 9.4 03/13/2019 0806   CALCIUM 9.8 08/27/2017 0915   ALKPHOS 96 03/13/2019 0806   ALKPHOS 83 08/27/2017 0915   AST 21 03/13/2019 0806   AST 23 08/27/2017 0915   ALT 16 03/13/2019 0806   ALT  17 08/27/2017 0915   BILITOT 1.0 03/13/2019 0806   BILITOT 0.95 08/27/2017 0915      Results for FELICE, DEEM (MRN [id number]) as of 03/16/2019 10:26  Ref. Range 12/14/2018 08:11 03/13/2019 08:06  Prostate Specific Ag, Serum Latest Ref Range: 0.0 - 4.0 ng/mL <0.1 <0.1   EXAM: CT ANGIOGRAPHY CHEST, ABDOMEN AND PELVIS  TECHNIQUE: Multidetector CT imaging through the chest, abdomen and pelvis was performed using the standard protocol during bolus administration of intravenous contrast. Multiplanar reconstructed images and MIPs were obtained and reviewed to evaluate the vascular anatomy.  CONTRAST:  146mL OMNIPAQUE IOHEXOL 350 MG/ML SOLN  COMPARISON:  02/02/2018  FINDINGS: CTA CHEST FINDINGS  Cardiovascular: Heart is normal size. Pacer wires noted in the right heart. Extensive coronary artery calcifications in all major coronary vessels including left main. Diffuse aortic atherosclerosis and tortuosity. Proximal ascending thoracic aorta mildly aneurysmal at 4.2 cm, stable. Aortic arch measures 3.5 cm. Distal descending thoracic aorta 3.1 cm.  Mediastinum/Nodes: No mediastinal, hilar, or axillary adenopathy.  Lungs/Pleura: Severe emphysema. Biapical scarring. No confluent opacities or  effusions.  Musculoskeletal: Chest wall soft tissues are unremarkable. No acute bony abnormality.  Review of the MIP images confirms the above findings.  CTA ABDOMEN AND PELVIS FINDINGS  VASCULAR  Aorta: Mild aneurysmal dilatation of the abdominal aorta measuring 3.6 cm below the level of the renal arteries. Moderate calcified and noncalcified plaque throughout the abdominal aorta.  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 Mild poststenotic dilatation.  SMA: Atherosclerotic calcifications.  No stenosis or dissection.  Renals: Single renal arteries bilaterally. Moderate calcified plaque at the origins bilaterally. No significant stenosis.  IMA: Occluded proximally, filling via collaterals.  Inflow: Iliac atherosclerosis. No aneurysm, dissection or significant stenosis.  Veins: No obvious venous abnormality within the limitations of this arterial phase study.  Review of the MIP images confirms the above findings.  NON-VASCULAR  Hepatobiliary: No focal hepatic abnormality. Gallbladder unremarkable.  Pancreas: Diffuse pancreatic atrophy and fatty replacement. No focal abnormality or ductal dilatation.  Spleen: No focal abnormality.  Normal size.  Adrenals/Urinary Tract: 5.5 cm cyst off the lower pole of the left kidney is stable and appears simple. No hydronephrosis. No adrenal mass. Urinary bladder grossly unremarkable.  Stomach/Bowel: Sigmoid diverticulosis. No active diverticulitis. Stomach and small bowel decompressed, unremarkable.  Lymphatic: No adenopathy.  Reproductive: Radiation seeds in the prostate.  Other: Bilateral inguinal hernias containing fat. No free fluid or free air.  Musculoskeletal: No acute bony abnormality. Degenerative changes throughout the lumbar spine.  Review of the MIP images confirms the above findings.  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 3.6 cm infrarenal abdominal aortic aneurysm. Recommend annual imaging followup by CTA or MRA. This recommendation follows 2010 ACCF/AHA/AATS/ACR/ASA/SCA/SCAI/SIR/STS/SVM Guidelines for the Diagnosis and Management of Patients with Thoracic Aortic Disease. Circulation. 2010; 121: D664-Q034. Aortic aneurysm NOS (ICD10-I71.9)  Extensive coronary artery disease.  Severe emphysema.  No acute cardiopulmonary disease.  Sigmoid diverticulosis.  No active diverticulitis.  No acute findings in the abdomen or pelvis.   Impression and Plan:   78 year old man with:  1.  Castration-resistant prostate cancer with biochemical relapse mostly and no measurable disease at this time.    He is currently on Zytiga without any evidence of disease relapse at this time.  His PSA on 03/13/2019 is undetectable.  CT scans obtained on 02/07/2019 was personally reviewed and images did not show any evidence of disease relapse.  Risks  and benefits of continuing Zytiga was reviewed today.  Potential complications were reiterated.  These issues include adrenal insufficiency, hypertension, hypokalemia were reviewed.  Different salvage therapies may be needed he develops relapsed disease including systemic chemotherapy among others.   2. Androgen depravation: Long-term complication associated with Lupron was reviewed today.  These would include osteoporosis, weight gain among others.  Will receive that today and every 3 months.   3. Polycythemia: Hemoglobin continues to be within normal range after stopping smoking.  Continue to educate him about not resuming smoking at this time.  4. Hypokalemia: Related to Zytiga.  His potassium is adequate at this time.  5. Hypertension: Blood pressure is within normal range today.  We will continue to monitor on Zytiga.  6.  Prognosis and goals of care: Therapy remains palliative given that his disease is incurable.  Aggressive measures are  warranted at this time given his excellent performance status.   7. Follow-up: We will be in 3 months for repeat evaluation.  He will receive Lupron at that time as well.  25  minutes was spent with the patient face-to-face today.  More than 50% of time was dedicated to reviewing his disease status, reviewing laboratory data and imaging studies and answering questions regarding future plan of care.    Zola Button, MD 7/2/202010:29 AM

## 2019-03-16 NOTE — Telephone Encounter (Signed)
Scheduled appt per 7/2 los. Printed calendar and avs.

## 2019-03-16 NOTE — Progress Notes (Signed)
Treatment plan stated Q4 mths for injection Dr Hazeline Junker notes stated Q3 mths notified pharmacy [facility])  that Dr [name] had changed the intervals and that PT was to receive the injection today.

## 2019-03-16 NOTE — Patient Instructions (Signed)
Leuprolide injection What is this medicine? LEUPROLIDE (loo PROE lide) is a man-made hormone. It is used to treat the symptoms of prostate cancer. This medicine may also be used to treat children with early onset of puberty. It may be used for other hormonal conditions. This medicine may be used for other purposes; ask your health care provider or pharmacist if you have questions. COMMON BRAND NAME(S): Lupron What should I tell my health care provider before I take this medicine? They need to know if you have any of these conditions:  diabetes  heart disease or previous heart attack  high blood pressure  high cholesterol  pain or difficulty passing urine  spinal cord metastasis  stroke  tobacco smoker  an unusual or allergic reaction to leuprolide, benzyl alcohol, other medicines, foods, dyes, or preservatives  pregnant or trying to get pregnant  breast-feeding How should I use this medicine? This medicine is for injection under the skin or into a muscle. You will be taught how to prepare and give this medicine. Use exactly as directed. Take your medicine at regular intervals. Do not take your medicine more often than directed. It is important that you put your used needles and syringes in a special sharps container. Do not put them in a trash can. If you do not have a sharps container, call your pharmacist or healthcare provider to get one. A special MedGuide will be given to you by the pharmacist with each prescription and refill. Be sure to read this information carefully each time. Talk to your pediatrician regarding the use of this medicine in children. While this medicine may be prescribed for children as young as 8 years for selected conditions, precautions do apply. Overdosage: If you think you have taken too much of this medicine contact a poison control center or emergency room at once. NOTE: This medicine is only for you. Do not share this medicine with others. What if  I miss a dose? If you miss a dose, take it as soon as you can. If it is almost time for your next dose, take only that dose. Do not take double or extra doses. What may interact with this medicine? Do not take this medicine with any of the following medications:  chasteberry This medicine may also interact with the following medications:  herbal or dietary supplements, like black cohosh or DHEA  male hormones, like estrogens or progestins and birth control pills, patches, rings, or injections  male hormones, like testosterone This list may not describe all possible interactions. Give your health care provider a list of all the medicines, herbs, non-prescription drugs, or dietary supplements you use. Also tell them if you smoke, drink alcohol, or use illegal drugs. Some items may interact with your medicine. What should I watch for while using this medicine? Visit your doctor or health care professional for regular checks on your progress. During the first week, your symptoms may get worse, but then will improve as you continue your treatment. You may get hot flashes, increased bone pain, increased difficulty passing urine, or an aggravation of nerve symptoms. Discuss these effects with your doctor or health care professional, some of them may improve with continued use of this medicine. Male patients may experience a menstrual cycle or spotting during the first 2 months of therapy with this medicine. If this continues, contact your doctor or health care professional. This medicine may increase blood sugar. Ask your healthcare provider if changes in diet or medicines are needed if   you have diabetes. What side effects may I notice from receiving this medicine? Side effects that you should report to your doctor or health care professional as soon as possible:  allergic reactions like skin rash, itching or hives, swelling of the face, lips, or tongue  breathing problems  chest  pain  depression or memory disorders  pain in your legs or groin  pain at site where injected  severe headache  signs and symptoms of high blood sugar such as being more thirsty or hungry or having to urinate more than normal. You may also feel very tired or have blurry vision  swelling of the feet and legs  visual changes  vomiting Side effects that usually do not require medical attention (report to your doctor or health care professional if they continue or are bothersome):  breast swelling or tenderness  decrease in sex drive or performance  diarrhea  hot flashes  loss of appetite  muscle, joint, or bone pains  nausea  redness or irritation at site where injected  skin problems or acne This list may not describe all possible side effects. Call your doctor for medical advice about side effects. You may report side effects to FDA at 1-800-FDA-1088. Where should I keep my medicine? Keep out of the reach of children. Store below 25 degrees C (77 degrees F). Do not freeze. Protect from light. Do not use if it is not clear or if there are particles present. Throw away any unused medicine after the expiration date. NOTE: This sheet is a summary. It may not cover all possible information. If you have questions about this medicine, talk to your doctor, pharmacist, or health care provider.  2020 Elsevier/Gold Standard (2018-06-30 09:52:48)  

## 2019-03-20 ENCOUNTER — Other Ambulatory Visit: Payer: Self-pay | Admitting: Surgery

## 2019-03-20 ENCOUNTER — Ambulatory Visit (INDEPENDENT_AMBULATORY_CARE_PROVIDER_SITE_OTHER): Payer: Medicare Other | Admitting: *Deleted

## 2019-03-20 DIAGNOSIS — I495 Sick sinus syndrome: Secondary | ICD-10-CM | POA: Diagnosis not present

## 2019-03-20 DIAGNOSIS — I712 Thoracic aortic aneurysm, without rupture, unspecified: Secondary | ICD-10-CM

## 2019-03-20 LAB — CUP PACEART REMOTE DEVICE CHECK
Battery Remaining Longevity: 159 mo
Battery Voltage: 3.1 V
Brady Statistic AP VP Percent: 0.09 %
Brady Statistic AP VS Percent: 79.84 %
Brady Statistic AS VP Percent: 0.02 %
Brady Statistic AS VS Percent: 20.06 %
Brady Statistic RA Percent Paced: 79.54 %
Brady Statistic RV Percent Paced: 0.11 %
Date Time Interrogation Session: 20200706061022
Implantable Lead Implant Date: 20191001
Implantable Lead Implant Date: 20191001
Implantable Lead Location: 753859
Implantable Lead Location: 753860
Implantable Lead Model: 5076
Implantable Lead Model: 5076
Implantable Pulse Generator Implant Date: 20191001
Lead Channel Impedance Value: 304 Ohm
Lead Channel Impedance Value: 323 Ohm
Lead Channel Impedance Value: 456 Ohm
Lead Channel Impedance Value: 570 Ohm
Lead Channel Pacing Threshold Amplitude: 0.5 V
Lead Channel Pacing Threshold Amplitude: 1 V
Lead Channel Pacing Threshold Pulse Width: 0.4 ms
Lead Channel Pacing Threshold Pulse Width: 0.4 ms
Lead Channel Sensing Intrinsic Amplitude: 3 mV
Lead Channel Sensing Intrinsic Amplitude: 3 mV
Lead Channel Sensing Intrinsic Amplitude: 4.875 mV
Lead Channel Sensing Intrinsic Amplitude: 4.875 mV
Lead Channel Setting Pacing Amplitude: 1.5 V
Lead Channel Setting Pacing Amplitude: 2.5 V
Lead Channel Setting Pacing Pulse Width: 0.4 ms
Lead Channel Setting Sensing Sensitivity: 0.9 mV

## 2019-03-27 ENCOUNTER — Encounter: Payer: Self-pay | Admitting: Cardiology

## 2019-03-27 NOTE — Progress Notes (Signed)
Remote pacemaker transmission.   

## 2019-04-03 MED FILL — ABIRATERONE ACETATE 250 MG: 250 | 30 days supply | Qty: 120 | Fill #2

## 2019-04-19 ENCOUNTER — Ambulatory Visit: Payer: Medicare Other | Admitting: Surgery

## 2019-04-19 ENCOUNTER — Inpatient Hospital Stay: Admission: RE | Admit: 2019-04-19 | Payer: Medicare Other | Source: Ambulatory Visit

## 2019-04-27 ENCOUNTER — Other Ambulatory Visit: Payer: Self-pay | Admitting: Oncology

## 2019-04-27 DIAGNOSIS — C61 Malignant neoplasm of prostate: Secondary | ICD-10-CM

## 2019-05-02 MED FILL — ABIRATERONE ACETATE 250 MG: 250 | 30 days supply | Qty: 120 | Fill #0

## 2019-05-08 ENCOUNTER — Other Ambulatory Visit: Payer: Self-pay | Admitting: *Deleted

## 2019-05-08 MED ORDER — METOPROLOL TARTRATE 25 MG PO TABS: 12.5000 mg | ORAL_TABLET | Freq: Two times a day (BID) | ORAL | 3 refills | Status: DC

## 2019-05-08 MED ORDER — OMEPRAZOLE 20 MG PO CPDR: 20.0000 mg | DELAYED_RELEASE_CAPSULE | Freq: Every day | ORAL | 3 refills | Status: DC

## 2019-05-17 ENCOUNTER — Encounter: Payer: Self-pay | Admitting: Family Medicine

## 2019-05-17 ENCOUNTER — Ambulatory Visit (INDEPENDENT_AMBULATORY_CARE_PROVIDER_SITE_OTHER): Payer: Medicare Other | Admitting: Family Medicine

## 2019-05-17 VITALS — BP 122/74 | HR 73 | Temp 97.7°F | Resp 16 | Ht 69.0 in | Wt 217.2 lb

## 2019-05-17 DIAGNOSIS — I10 Essential (primary) hypertension: Secondary | ICD-10-CM

## 2019-05-17 DIAGNOSIS — Z23 Encounter for immunization: Secondary | ICD-10-CM | POA: Diagnosis not present

## 2019-05-17 DIAGNOSIS — R600 Localized edema: Secondary | ICD-10-CM

## 2019-05-17 DIAGNOSIS — G4700 Insomnia, unspecified: Secondary | ICD-10-CM

## 2019-05-17 DIAGNOSIS — R059 Cough, unspecified: Secondary | ICD-10-CM

## 2019-05-17 DIAGNOSIS — R05 Cough: Secondary | ICD-10-CM

## 2019-05-17 DIAGNOSIS — J439 Emphysema, unspecified: Secondary | ICD-10-CM

## 2019-05-17 DIAGNOSIS — F418 Other specified anxiety disorders: Secondary | ICD-10-CM

## 2019-05-17 DIAGNOSIS — I8393 Asymptomatic varicose veins of bilateral lower extremities: Secondary | ICD-10-CM

## 2019-05-17 MED ORDER — LORAZEPAM 1 MG PO TABS: 0.5000 mg | ORAL_TABLET | Freq: Two times a day (BID) | ORAL | 3 refills | Status: DC | PRN

## 2019-05-17 MED ORDER — FLUTICASONE PROPIONATE 50 MCG/ACT NA SUSP: 1.0000 | Freq: Every day | NASAL | 3 refills | Status: DC | PRN

## 2019-05-17 MED ORDER — BENZONATATE 100 MG PO CAPS: 200.0000 mg | ORAL_CAPSULE | Freq: Every evening | ORAL | 0 refills | Status: AC | PRN

## 2019-05-17 NOTE — Assessment & Plan Note (Signed)
BP adequately controlled. Continue amlodipine 2.5 mg daily and low-salt diet. Continue monitoring BP at home. Follow-up in 6 months.

## 2019-05-17 NOTE — Assessment & Plan Note (Signed)
Most likely related to vein disease. Problem has improved. Continue furosemide 20 mg daily as needed. We discussed some side effects of diuretics.

## 2019-05-17 NOTE — Patient Instructions (Addendum)
A few things to remember from today's visit:   Essential hypertension, benign  Insomnia, unspecified type  Other specified anxiety disorders  Pulmonary emphysema, unspecified emphysema type (HCC)  Cough - Plan: benzonatate (TESSALON) 100 MG capsule Use Flonase nasal spray at bedtime after rinsing her nose with saline. If cough is persistent, we can try medication for acid reflux treatment. I will see her back in 6 months.   Please be sure medication list is accurate. If a new problem present, please set up appointment sooner than planned today.

## 2019-05-17 NOTE — Progress Notes (Signed)
HPI:   Tony [name] is a 78 y.o. male, who is here today for chronic disease management.  Hypertension, currently he is on amlodipine 2.5 mg daily and Metoprolol Tartrate 12.5 mg bid.[name] He is checking BP at home, it is "good." Denies severe/frequent headache, visual changes, chest pain, dyspnea, palpitation, claudication, focal weakness, or worsening edema. + Ascending aortic aneurysm and CAD, follows with Dr Sallyanne Kuster.  HypoK+,he is on K-DUR 20 meq daily.  Currently he is on furosemide 20 mg daily as needed to help with lower extremity edema. Worse at the end of the day with prolonged standing.  Problem has improved. He denies pain or erythema.  Lab Results  Component Value Date   CREATININE 1.07 03/13/2019   BUN 14 03/13/2019   NA 142 03/13/2019   K 3.8 03/13/2019   CL 103 03/13/2019   CO2 26 03/13/2019   Anxiety: Currently he is on Celexa 10 mg daily and Ativan 1 mg 1/2 tablet twice daily. Medication is still helping, wife has noted great improvement. He denies side effects.  He is also requesting refill for benzonatate. For a few weeks he has had productive cough while he is in bed, occasionally clearish sputum. He denies hemoptysis. Negative for fever,chill,body aches, dyspnea,or wheezing. COPD, he is on Advair 100-50 mcg and albuterol inh as needed. He does not use Albuterol often. [name] He denies respiratory symptoms during the day.  Review of Systems  Constitutional: Negative for activity change, appetite change and fever.  HENT: Negative for nosebleeds, sore throat and trouble swallowing.   Eyes: Negative for pain and redness.  Respiratory: Negative for chest tightness and stridor.   Gastrointestinal: Negative for abdominal pain, nausea and vomiting.  Genitourinary: Negative for decreased urine volume, dysuria and hematuria.  Neurological: Negative for syncope and facial asymmetry.  Rest see pertinent positives and negatives per HPI.    Current Outpatient Medications on File Prior to Visit  Medication Sig Dispense Refill  . abiraterone acetate (ZYTIGA) 250 MG tablet TAKE 4 TABLETS (1,000 MG TOTAL) BY MOUTH DAILY. TAKE ON AN EMPTY STOMACH 1 HOUR BEFORE OR 2 HOURS AFTER A MEAL 120 tablet 2  . acetaminophen (TYLENOL) 500 MG tablet Take 500 mg by mouth every 6 (six) hours as needed (for pain).    [name] albuterol (VENTOLIN HFA) 108 (90 Base) MCG/ACT inhaler Inhale 2 puffs into the lungs every 6 (six) hours as needed for wheezing or shortness of breath. 8.5 g 0  . amLODipine (NORVASC) 2.5 MG tablet Take 1 tablet (2.5 mg total) by mouth daily. 90 tablet 2  . aspirin EC 81 MG tablet Take 81 mg by mouth daily.    [name] atorvastatin (LIPITOR) 20 MG tablet Take 1 tablet (20 mg total) by mouth daily. 90 tablet 3  . carbamide peroxide (DEBROX) 6.5 % OTIC solution Place 5 drops into both ears daily as needed (for earwax).    . citalopram (CELEXA) 10 MG tablet Take 1 tablet (10 mg total) by mouth daily. 90 tablet 2  . diclofenac sodium (VOLTAREN) 1 % GEL Apply 4 g topically 4 (four) times daily. 4 Tube 3  . Fluticasone-Salmeterol (ADVAIR) 100-50 MCG/DOSE AEPB Inhale 1 puff into the lungs 2 (two) times daily. 60 each 3  . furosemide (LASIX) 20 MG tablet Take 20 mg by mouth daily for 5 days, then daily as needed for edema 90 tablet 1  . metoprolol tartrate (LOPRESSOR) 25 MG tablet Take 0.5 tablets (12.5 mg total) by mouth  2 (two) times daily. 180 tablet 3  . Multiple Vitamin (MULTIVITAMIN WITH MINERALS) TABS tablet Take 0.5 tablets by mouth every other day. Centrum    . omeprazole (PRILOSEC) 20 MG capsule Take 1 capsule (20 mg total) by mouth daily before breakfast. 90 capsule 3  . polyethylene glycol powder (GLYCOLAX/MIRALAX) powder Take 17 g by mouth daily as needed (constipation.).    [name] potassium chloride SA (K-DUR) 20 MEQ tablet TAKE 1 TABLET DAILY 90 tablet 0  . predniSONE (DELTASONE) 5 MG tablet Take 1 tablet (5 mg total) by mouth daily with  breakfast. 90 tablet 1   No current facility-administered medications on file prior to visit.      Past Medical History:  Diagnosis Date  . Gastric outlet obstruction 02/03/2018   [name] 02/03/2018  . GERD (gastroesophageal reflux disease)   . History of hiatal hernia    small/notes 02/03/2018  . Hypertension    no meds  . Presence of permanent cardiac pacemaker   . 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 Radiation   . Sinus problem   . SSS (sick sinus syndrome) ([facility])    Allergies  Allergen Reactions  . Penicillins Rash    Has patient had a PCN reaction causing immediate rash, facial/tongue/throat swelling, SOB or lightheadedness with hypotension: Yes Has patient had a PCN reaction causing severe rash involving mucus membranes or skin necrosis: No Has patient had a PCN reaction that required hospitalization: No Has patient had a PCN reaction occurring within the last 10 years: No If all of the above answers are "NO", then may proceed with Cephalosporin use.    Social History   Socioeconomic History  . Marital status: Married    Spouse name: Not on file  . Number of children: 3  . Years of education: Not on file  . Highest education level: Not on file  Occupational History  . 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 Financial resource strain: Not hard at all  . Food insecurity    Worry: Never true    Inability: Never true  . Transportation needs    Medical: No    Non-medical: No  Tobacco Use  . Smoking status: Former Smoker    Years: 60.00    Types: Cigars    Quit date: 09/14/2012    Years since quitting: 6.6  . Smokeless tobacco: Never Used  . Tobacco comment: little cigars; the small ones"  Substance and Sexual Activity  . Alcohol use: Yes    Alcohol/week: 14.0 standard drinks    Types: 14 Cans of beer per week  . Drug use: No  . Sexual  activity: Not Currently  Lifestyle  . Physical activity    Days per week: 0 days    Minutes per session: 0 min  . Stress: Only a little  Relationships  . Social connections    Talks on phone: More than three times a week    Gets together: More than three times a week    Attends religious service: More than 4 times per year    Active member of club or organization: No    Attends meetings of clubs or organizations: Never    Relationship status: Married  Other Topics Concern  . Not on file  Social History Narrative   Lives with wife and 2 dogs in one  level home; daughter and her family co-habitate.   Has three daughters, all in [place], supportive. Five grandchildren.   Wants to return to doing silver sneakers once gym re-opens.          Vitals:   05/17/19 0944  BP: 122/74  Pulse: 73  Resp: 16  Temp: 97.7 F (36.5 C)  SpO2: 96%   Body mass index is 32.08 kg/m.  Physical Exam  Nursing note reviewed. Constitutional: He is oriented to person, place, and time. He appears well-developed. No distress.  HENT:  Head: Normocephalic and atraumatic.  Mouth/Throat: Oropharynx is clear and moist and mucous membranes are normal. He has dentures.  Eyes: Pupils are equal, round, and reactive to light. Conjunctivae are normal.  Cardiovascular: Normal rate and regular rhythm.  No murmur heard. DP present bilateral. Varicose veins in LE,bilateral.  Respiratory: Effort normal and breath sounds normal. No respiratory distress.  GI: Soft. He exhibits no mass. There is no hepatomegaly. There is no abdominal tenderness.  Musculoskeletal:        General: Edema (Trace pitting LE edema,bilateral.) present.  Lymphadenopathy:    He has no cervical adenopathy.  Neurological: He is alert and oriented to person, place, and time. He has normal strength. No cranial nerve deficit. Gait normal.  Skin: Skin is warm. No rash noted. No erythema.  Psychiatric: He has a normal mood and affect.  Well groomed,  good eye contact.    ASSESSMENT AND PLAN:  Mr. Trei was seen today for follow-up.  Diagnoses and all orders for this visit:   Insomnia, unspecified type Problem is well controlled. Good sleep hygiene. Continue Lorazepam 0.5 mg as needed.  Pulmonary emphysema, unspecified emphysema type ([employer]) Overall problem is well controlled. No changes in Advair 100-50 mcg bid, instructed to rinse mouth /swish after use. Continue Albuterol inh as needed.   Cough Possible etiologies discussed. ?allergies,COPD,GERD. Will treat post nasal drainage with Flonase nasal spray. Saline nasal irrigations as needed.  Chest CT 02/07/19 severe emphysema.Biapical scarring. No confluent opacities or effusions.  -     fluticasone (FLONASE) 50 MCG/ACT nasal spray; Place 1 spray into both nostrils daily as needed for allergies or rhinitis (at bedtime). -     benzonatate (TESSALON) 100 MG capsule; Take 2 capsules (200 mg total) by mouth at bedtime as needed.  Need for immunization against influenza -     Flu Vaccine QUAD High Dose(Fluad)  Essential hypertension, benign BP adequately controlled. Continue amlodipine 2.5 mg daily and low-salt diet. Continue monitoring BP at home. Follow-up in 6 months.   Bilateral lower extremity edema Most likely related to vein disease. Problem has improved. Continue furosemide 20 mg daily as needed. We discussed some side effects of diuretics.   Anxiety disorder Problem has been well controlled. Continue Celexa 10 mg daily and lorazepam 1 mg 0.5 tablet twice daily. We discussed some side effects of medications. Follow-up in 6 months, before if needed.  Varicose veins of both lower extremities Prescription for compression stockings given today. Appropriate skin care.   He has labs periodically at his oncologist office. We will plan on checking lipid panel next visit.  Return in about 6 months (around 11/14/2019) for f/u and cpe.    -Mr. Million Giannakopoulos  Marohl was advised to return sooner than planned today if new concerns arise.       Carlyn Mullenbach G. [name], MD  [facility] [place] office.

## 2019-05-17 NOTE — Assessment & Plan Note (Signed)
Problem has been well controlled. Continue Celexa 10 mg daily and lorazepam 1 mg 0.5 tablet twice daily. We discussed some side effects of medications. Follow-up in 6 months, before if needed.

## 2019-05-17 NOTE — Assessment & Plan Note (Signed)
Prescription for compression stockings given today. Appropriate skin care.

## 2019-06-01 MED FILL — ABIRATERONE ACETATE 250 MG: 250 | 30 days supply | Qty: 120 | Fill #1

## 2019-06-07 ENCOUNTER — Other Ambulatory Visit: Payer: Self-pay | Admitting: Family Medicine

## 2019-06-16 ENCOUNTER — Other Ambulatory Visit: Payer: Self-pay

## 2019-06-16 ENCOUNTER — Ambulatory Visit: Payer: Medicare Other

## 2019-06-16 ENCOUNTER — Inpatient Hospital Stay: Payer: Medicare Other | Attending: Oncology

## 2019-06-16 ENCOUNTER — Inpatient Hospital Stay (HOSPITAL_BASED_OUTPATIENT_CLINIC_OR_DEPARTMENT_OTHER): Payer: Medicare Other | Admitting: Oncology

## 2019-06-16 VITALS — BP 131/93 | HR 88 | Temp 98.5°F | Resp 18 | Wt 217.9 lb

## 2019-06-16 DIAGNOSIS — C61 Malignant neoplasm of prostate: Secondary | ICD-10-CM | POA: Insufficient documentation

## 2019-06-16 DIAGNOSIS — Z5111 Encounter for antineoplastic chemotherapy: Secondary | ICD-10-CM | POA: Insufficient documentation

## 2019-06-16 DIAGNOSIS — E876 Hypokalemia: Secondary | ICD-10-CM | POA: Insufficient documentation

## 2019-06-16 DIAGNOSIS — I10 Essential (primary) hypertension: Secondary | ICD-10-CM | POA: Insufficient documentation

## 2019-06-16 LAB — CMP (CANCER CENTER ONLY)
ALT: 13 U/L (ref 0–44)
AST: 18 U/L (ref 15–41)
Albumin: 4.4 g/dL (ref 3.5–5.0)
Alkaline Phosphatase: 88 U/L (ref 38–126)
Anion gap: 11 (ref 5–15)
BUN: 17 mg/dL (ref 8–23)
CO2: 25 mmol/L (ref 22–32)
Calcium: 9.6 mg/dL (ref 8.9–10.3)
Chloride: 103 mmol/L (ref 98–111)
Creatinine: 1.1 mg/dL (ref 0.61–1.24)
GFR, Est AFR Am: >60 mL/min (ref >60)
GFR, Estimated: >60 mL/min (ref >60)
Glucose, Bld: 126 mg/dL — ABNORMAL HIGH (ref 70–99)
Potassium: 4 mmol/L (ref 3.5–5.1)
Sodium: 139 mmol/L (ref 135–145)
Total Bilirubin: 1.6 mg/dL — ABNORMAL HIGH (ref 0.3–1.2)
Total Protein: 7.2 g/dL (ref 6.5–8.1)

## 2019-06-16 LAB — CBC WITH DIFFERENTIAL (CANCER CENTER ONLY)
Abs Immature Granulocytes: 0.04 10*3/uL (ref 0.00–0.07)
Basophils Absolute: 0.1 10*3/uL (ref 0.0–0.1)
Basophils Relative: 1 %
Eosinophils Absolute: 0.1 10*3/uL (ref 0.0–0.5)
Eosinophils Relative: 1 %
HCT: 45.6 % (ref 39.0–52.0)
Hemoglobin: 15.4 g/dL (ref 13.0–17.0)
Immature Granulocytes: 1 %
Lymphocytes Relative: 10 %
Lymphs Abs: 0.8 10*3/uL (ref 0.7–4.0)
MCH: 31.2 pg (ref 26.0–34.0)
MCHC: 33.8 g/dL (ref 30.0–36.0)
MCV: 92.5 fL (ref 80.0–100.0)
Monocytes Absolute: 0.5 10*3/uL (ref 0.1–1.0)
Monocytes Relative: 7 %
Neutro Abs: 6.7 10*3/uL (ref 1.7–7.7)
Neutrophils Relative %: 80 %
Platelet Count: 187 10*3/uL (ref 150–400)
RBC: 4.93 MIL/uL (ref 4.22–5.81)
RDW: 13.5 % (ref 11.5–15.5)
WBC Count: 8.2 10*3/uL (ref 4.0–10.5)
nRBC: 0 % (ref 0.0–0.2)

## 2019-06-16 MED ORDER — LEUPROLIDE ACETATE (3 MONTH) 22.5 MG ~~LOC~~ KIT
22.5000 mg | PACK | Freq: Once | SUBCUTANEOUS | Status: AC
  Administered 2019-06-16: 22.5 mg via SUBCUTANEOUS
  Filled 2019-06-16: qty 22.5

## 2019-06-16 NOTE — Progress Notes (Signed)
Hematology and Oncology Follow Up Visit  Tony [name] [id number] [birth year] 78 y.o. 06/16/2019 9:36 AM Tony, Betty [name], [name], [name], MD   Principle Diagnosis: 78 year old man with prostate cancer diagnosed in 2015 after presenting with Gleason score 4+5 = 9 and a PSA 15.7.  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    Prior Therapy:  He is status post radiation therapy for external beam radiation completed in September 2015 for a total of 45 gray. He also status post seed implant brachytherapy boost completed in October 2015.   He received total of 1 year of androgen deprivation 22,015 and 2016. His PSA nadir was to 0.47 in November 2016. His PSA was 4.08 in May 2017.  Current therapy:  Androgen deprivation in the form of Lupron 22.5 mg started on 03/02/2016.  Last injection given on 03/16/2019.  Zytiga 1000 mg with prednisone started in August 2017.   Interim History: Tony [name] is here for a follow-up visit.  Since the last visit, he reports no changes in his health.  He continues to tolerate Zytiga without any new complications.  He denies any nausea, vomiting or abdominal pain.  He denies any lower extremity edema or confusion.  He denies any pathological fractures.   Patient denied any alteration mental status, neuropathy, confusion or dizziness.  Denies any headaches or lethargy.  Denies any night sweats, weight loss or changes in appetite.  Denied orthopnea, dyspnea on exertion or chest discomfort.  Denies shortness of breath, difficulty breathing hemoptysis or cough.  Denies any abdominal distention, nausea, early satiety or dyspepsia.  Denies any hematuria, frequency, dysuria or nocturia.  Denies any skin irritation, dryness or rash.  Denies any ecchymosis or petechiae.  Denies any lymphadenopathy or clotting.  Denies any heat or cold intolerance.  Denies any anxiety or depression.  Remaining review of system is  negative.         Medications: Updated on reviewed today. Current Outpatient Medications  Medication Sig Dispense Refill  . abiraterone acetate (ZYTIGA) 250 MG tablet TAKE 4 TABLETS (1,000 MG TOTAL) BY MOUTH DAILY. TAKE ON AN EMPTY STOMACH 1 HOUR BEFORE OR 2 HOURS AFTER A MEAL 120 tablet 2  . acetaminophen (TYLENOL) 500 MG tablet Take 500 mg by mouth every 6 (six) hours as needed (for pain).    [name] albuterol (VENTOLIN HFA) 108 (90 Base) MCG/ACT inhaler Inhale 2 puffs into the lungs every 6 (six) hours as needed for wheezing or shortness of breath. 8.5 [name] 0  . amLODipine (NORVASC) 2.5 MG tablet Take 1 tablet (2.5 mg total) by mouth daily. 90 tablet 2  . aspirin EC 81 MG tablet Take 81 mg by mouth daily.    [name] atorvastatin (LIPITOR) 20 MG tablet Take 1 tablet (20 mg total) by mouth daily. 90 tablet 3  . benzonatate (TESSALON) 100 MG capsule Take 2 capsules (200 mg total) by mouth at bedtime as needed. 40 capsule 0  . carbamide peroxide (DEBROX) 6.5 % OTIC solution Place 5 drops into both ears daily as needed (for earwax).    . citalopram (CELEXA) 10 MG tablet Take 1 tablet (10 mg total) by mouth daily. 90 tablet 2  . diclofenac sodium (VOLTAREN) 1 % GEL Apply 4 [name] topically 4 (four) times daily. 4 Tube 3  . fluticasone (FLONASE) 50 MCG/ACT nasal spray Place 1 spray into both nostrils daily as needed for allergies or rhinitis (at bedtime). 16 [name] 3  . Fluticasone-Salmeterol (ADVAIR) 100-50 MCG/DOSE AEPB  Inhale 1 puff into the lungs 2 (two) times daily. 60 each 3  . furosemide (LASIX) 20 MG tablet Take 20 mg by mouth daily for 5 days, then daily as needed for edema 90 tablet 1  . KLOR-CON M20 20 MEQ tablet TAKE 1 TABLET BY MOUTH TWICE A DAY 60 tablet 0  . LORazepam (ATIVAN) 1 MG tablet Take 0.5 tablets (0.5 mg total) by mouth 2 (two) times daily as needed. 30 tablet 3  . metoprolol tartrate (LOPRESSOR) 25 MG tablet Take 0.5 tablets (12.5 mg total) by mouth 2 (two) times daily. 180 tablet 3  .  Multiple Vitamin (MULTIVITAMIN WITH MINERALS) TABS tablet Take 0.5 tablets by mouth every other day. Centrum    . omeprazole (PRILOSEC) 20 MG capsule Take 1 capsule (20 mg total) by mouth daily before breakfast. 90 capsule 3  . polyethylene glycol powder (GLYCOLAX/MIRALAX) powder Take 17 [name] by mouth daily as needed (constipation.).    [name] predniSONE (DELTASONE) 5 MG tablet Take 1 tablet (5 mg total) by mouth daily with breakfast. 90 tablet 1   No current facility-administered medications for this visit.      Allergies:  Allergies  Allergen Reactions  . Penicillins Rash    Has patient had a PCN reaction causing immediate rash, facial/tongue/throat swelling, SOB or lightheadedness with hypotension: Yes Has patient had a PCN reaction causing severe rash involving mucus membranes or skin necrosis: No Has patient had a PCN reaction that required hospitalization: No Has patient had a PCN reaction occurring within the last 10 years: No If all of the above answers are "NO", then may proceed with Cephalosporin use.    Past Medical History, Surgical history, Social history, and Family History unchanged by review.   Physical Exam:       ECOG: 1   General appearance: Comfortable appearing without any discomfort Head: Normocephalic without any trauma Oropharynx: Mucous membranes are moist and pink without any thrush or ulcers. Eyes: Pupils are equal and round reactive to light. Lymph nodes: No cervical, supraclavicular, inguinal or axillary lymphadenopathy.   Heart:regular rate and rhythm.  S1 and S2 without leg edema. Lung: Clear without any rhonchi or wheezes.  No dullness to percussion. Abdomin: Soft, nontender, nondistended with good bowel sounds.  No hepatosplenomegaly. Musculoskeletal: No joint deformity or effusion.  Full range of motion noted. Neurological: No deficits noted on motor, sensory and deep tendon reflex exam. Skin: No petechial rash or dryness.  Appeared moist.          Lab Results: Lab Results  Component Value Date   WBC 9.8 03/13/2019   HGB 15.4 03/13/2019   HCT 46.7 03/13/2019   MCV 94.2 03/13/2019   PLT 174 03/13/2019     Chemistry      Component Value Date/Time   NA 142 03/13/2019 0806   NA 139 02/03/2019 0857   NA 141 08/27/2017 0915   K 3.8 03/13/2019 0806   K 4.8 08/27/2017 0915   CL 103 03/13/2019 0806   CO2 26 03/13/2019 0806   CO2 26 08/27/2017 0915   BUN 14 03/13/2019 0806   BUN 13 02/03/2019 0857   BUN 16.2 08/27/2017 0915   CREATININE 1.07 03/13/2019 0806   CREATININE 0.9 08/27/2017 0915   GLU 105 01/10/2016      Component Value Date/Time   CALCIUM 9.4 03/13/2019 0806   CALCIUM 9.8 08/27/2017 0915   ALKPHOS 96 03/13/2019 0806   ALKPHOS 83 08/27/2017 0915   AST 21 03/13/2019 0806  AST 23 08/27/2017 0915   ALT 16 03/13/2019 0806   ALT 17 08/27/2017 0915   BILITOT 1.0 03/13/2019 0806   BILITOT 0.95 08/27/2017 0915      Results for YOSIF, PAULES (MRN [id number]) as of 06/16/2019 09:38  Ref. Range 12/14/2018 08:11 03/13/2019 08:06  Prostate Specific Ag, Serum Latest Ref Range: 0.0 - 4.0 ng/mL <0.1 <0.1      Impression and Plan:   78 year old man with:  1.  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   He continues to have it excellent PSA response at this time currently undetectable on Zytiga and androgen deprivation.  Risks and benefits of continuing this approach was discussed today.  Potential complication associated with therapy including hypertension, edema and adrenal insufficiency were reviewed.  He is agreeable to continue at this time.  Alternative options for the future including Xtandi, Xofigo and systemic chemotherapy.   2. Androgen depravation: He continues to tolerate Lupron without any major complications.  Long-term complication occluding weight gain, hot flashes among others were reviewed.  Is agreeable to continue to receive Eligard  today and repeat in 3 months.   3. 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  4. Hypokalemia: His potassium has been within normal range on Zytiga.  We will continue to monitor.  5. Hypertension: His blood pressure needs to be monitored periodically on Zytiga.  Has been close to normal range.  6.  Prognosis and goals of care: His disease is incurable although aggressive measures are warranted given his excellent performance status.   7. Follow-up: In 3 months for repeat laboratory testing, MD evaluation and injection.  25  minutes was spent with the patient face-to-face today.  More than 50% of time was spent on reviewing laboratory data, disease status update, treatment options and complications related to therapy.    Zola Button, MD 10/2/20209:36 AM

## 2019-06-17 LAB — PROSTATE-SPECIFIC AG, SERUM (LABCORP): Prostate Specific Ag, Serum: <0.1 ng/mL (ref 0.0–4.0)

## 2019-06-19 ENCOUNTER — Telehealth: Payer: Self-pay

## 2019-06-19 ENCOUNTER — Ambulatory Visit (INDEPENDENT_AMBULATORY_CARE_PROVIDER_SITE_OTHER): Payer: Medicare Other | Admitting: *Deleted

## 2019-06-19 DIAGNOSIS — I495 Sick sinus syndrome: Secondary | ICD-10-CM | POA: Diagnosis not present

## 2019-06-19 LAB — CUP PACEART REMOTE DEVICE CHECK
Battery Remaining Longevity: 156 mo
Battery Voltage: 3.06 V
Brady Statistic AP VP Percent: 0.05 %
Brady Statistic AP VS Percent: 82.55 %
Brady Statistic AS VP Percent: 0.01 %
Brady Statistic AS VS Percent: 17.39 %
Brady Statistic RA Percent Paced: 82.13 %
Brady Statistic RV Percent Paced: 0.06 %
Date Time Interrogation Session: 20201005033748
Implantable Lead Implant Date: 20191001
Implantable Lead Implant Date: 20191001
Implantable Lead Location: 753859
Implantable Lead Location: 753860
Implantable Lead Model: 5076
Implantable Lead Model: 5076
Implantable Pulse Generator Implant Date: 20191001
Lead Channel Impedance Value: 323 Ohm
Lead Channel Impedance Value: 342 Ohm
Lead Channel Impedance Value: 475 Ohm
Lead Channel Impedance Value: 570 Ohm
Lead Channel Pacing Threshold Amplitude: 0.625 V
Lead Channel Pacing Threshold Amplitude: 1 V
Lead Channel Pacing Threshold Pulse Width: 0.4 ms
Lead Channel Pacing Threshold Pulse Width: 0.4 ms
Lead Channel Sensing Intrinsic Amplitude: 2.625 mV
Lead Channel Sensing Intrinsic Amplitude: 2.625 mV
Lead Channel Sensing Intrinsic Amplitude: 3.375 mV
Lead Channel Sensing Intrinsic Amplitude: 3.375 mV
Lead Channel Setting Pacing Amplitude: 1.5 V
Lead Channel Setting Pacing Amplitude: 2.5 V
Lead Channel Setting Pacing Pulse Width: 0.4 ms
Lead Channel Setting Sensing Sensitivity: 0.9 mV

## 2019-06-19 NOTE — Telephone Encounter (Signed)
-----   Message from Tony Portela, MD sent at 06/19/2019  8:32 AM EDT ----- Please let him know his PSA is still low

## 2019-06-19 NOTE — Telephone Encounter (Signed)
Contacted patient, spoke with spouse and made aware of PSA results.

## 2019-06-20 ENCOUNTER — Ambulatory Visit: Payer: Medicare Other

## 2019-06-20 ENCOUNTER — Ambulatory Visit: Payer: Medicare Other | Admitting: Oncology

## 2019-06-21 ENCOUNTER — Telehealth: Payer: Self-pay | Admitting: Oncology

## 2019-06-21 NOTE — Telephone Encounter (Signed)
Called and left msg. Mailed printout  °

## 2019-06-25 ENCOUNTER — Other Ambulatory Visit: Payer: Self-pay | Admitting: Family Medicine

## 2019-06-25 DIAGNOSIS — C61 Malignant neoplasm of prostate: Secondary | ICD-10-CM

## 2019-06-26 NOTE — Progress Notes (Signed)
Remote pacemaker transmission.   

## 2019-06-27 ENCOUNTER — Other Ambulatory Visit: Payer: Self-pay | Admitting: Oncology

## 2019-06-27 ENCOUNTER — Other Ambulatory Visit: Payer: Self-pay | Admitting: Family Medicine

## 2019-06-27 ENCOUNTER — Telehealth: Payer: Self-pay

## 2019-06-27 DIAGNOSIS — C61 Malignant neoplasm of prostate: Secondary | ICD-10-CM

## 2019-06-27 DIAGNOSIS — J439 Emphysema, unspecified: Secondary | ICD-10-CM

## 2019-06-27 MED ORDER — PREDNISONE 5 MG PO TABS: ORAL_TABLET | ORAL | 0 refills | Status: DC

## 2019-06-27 NOTE — Telephone Encounter (Signed)
Patient requested Prednisone prescription ordered by Dr. [name] be sent to [id number]. Prescription sent as requested.

## 2019-07-03 MED FILL — ABIRATERONE ACETATE 250 MG: 250 | 30 days supply | Qty: 120 | Fill #2

## 2019-07-09 ENCOUNTER — Other Ambulatory Visit: Payer: Self-pay | Admitting: Family Medicine

## 2019-07-25 ENCOUNTER — Other Ambulatory Visit: Payer: Self-pay | Admitting: Oncology

## 2019-07-25 DIAGNOSIS — C61 Malignant neoplasm of prostate: Secondary | ICD-10-CM

## 2019-07-28 MED FILL — ABIRATERONE ACETATE 250 MG: 250 | 30 days supply | Qty: 120 | Fill #0

## 2019-08-04 ENCOUNTER — Telehealth: Payer: Self-pay | Admitting: Family Medicine

## 2019-08-04 NOTE — Telephone Encounter (Signed)
metoprolol tartrate (LOPRESSOR) 25 MG tablet   The instructions was changed from 1 tablet by mouth twice a day to take 0.5 tablets by mouth 2 times daily. The patients wife stated that he is doing good on the 1 tablet 2 times daily instead of half and would like for the instructions to go back to the original.  Please Advise

## 2019-08-04 NOTE — Telephone Encounter (Signed)
I have not changed his Metoprolol. His cardiologist decreased Metoprolol from 25 mg bid to 12.5 mg bid on 02/16/19 because he was having dizziness.  Jorryn Hershberger [name], MD

## 2019-08-04 NOTE — Telephone Encounter (Signed)
Spoke with patients wife. She is aware of the note below and will contact the cardiologist.

## 2019-08-12 ENCOUNTER — Other Ambulatory Visit: Payer: Self-pay | Admitting: Family Medicine

## 2019-08-28 ENCOUNTER — Other Ambulatory Visit: Payer: Self-pay

## 2019-08-28 MED ORDER — POTASSIUM CHLORIDE CRYS ER 20 MEQ PO TBCR: 20.0000 meq | EXTENDED_RELEASE_TABLET | Freq: Two times a day (BID) | ORAL | 0 refills | Status: DC

## 2019-08-28 MED FILL — ABIRATERONE ACETATE 250 MG: 250 | 30 days supply | Qty: 120 | Fill #1

## 2019-09-02 ENCOUNTER — Other Ambulatory Visit: Payer: Self-pay | Admitting: Family Medicine

## 2019-09-02 DIAGNOSIS — R059 Cough, unspecified: Secondary | ICD-10-CM

## 2019-09-02 DIAGNOSIS — R05 Cough: Secondary | ICD-10-CM

## 2019-09-18 ENCOUNTER — Other Ambulatory Visit: Payer: Self-pay

## 2019-09-18 ENCOUNTER — Encounter: Payer: Self-pay | Admitting: Cardiovascular Disease

## 2019-09-18 ENCOUNTER — Ambulatory Visit (INDEPENDENT_AMBULATORY_CARE_PROVIDER_SITE_OTHER): Payer: Medicare Other | Admitting: *Deleted

## 2019-09-18 ENCOUNTER — Ambulatory Visit (INDEPENDENT_AMBULATORY_CARE_PROVIDER_SITE_OTHER): Payer: Medicare Other | Admitting: Cardiovascular Disease

## 2019-09-18 VITALS — BP 122/72 | HR 81 | Temp 98.1°F | Ht 69.0 in | Wt 223.0 lb

## 2019-09-18 DIAGNOSIS — E7800 Pure hypercholesterolemia, unspecified: Secondary | ICD-10-CM

## 2019-09-18 DIAGNOSIS — I7121 Aneurysm of the ascending aorta, without rupture: Secondary | ICD-10-CM

## 2019-09-18 DIAGNOSIS — Z950 Presence of cardiac pacemaker: Secondary | ICD-10-CM

## 2019-09-18 DIAGNOSIS — I495 Sick sinus syndrome: Secondary | ICD-10-CM | POA: Diagnosis not present

## 2019-09-18 DIAGNOSIS — C61 Malignant neoplasm of prostate: Secondary | ICD-10-CM

## 2019-09-18 DIAGNOSIS — I2510 Atherosclerotic heart disease of native coronary artery without angina pectoris: Secondary | ICD-10-CM

## 2019-09-18 DIAGNOSIS — I712 Thoracic aortic aneurysm, without rupture: Secondary | ICD-10-CM | POA: Diagnosis not present

## 2019-09-18 DIAGNOSIS — I714 Abdominal aortic aneurysm, without rupture, unspecified: Secondary | ICD-10-CM

## 2019-09-18 DIAGNOSIS — I453 Trifascicular block: Secondary | ICD-10-CM

## 2019-09-18 DIAGNOSIS — I10 Essential (primary) hypertension: Secondary | ICD-10-CM

## 2019-09-18 NOTE — Patient Instructions (Signed)

## 2019-09-18 NOTE — Progress Notes (Signed)
Cardiology OfficeNote    Date:  09/20/2019   ID:  Tony [name], DOB [birth year], MRN [id number]  PCP:  [name], [name], MD  Cardiologist:   Sanda Klein, MD    Chief Complaint  Patient presents with  . Follow-up    12 months.  . Pacemaker Check    History of Present Illness:  Tony [name] is a 79 y.o. male with asymptomatic peripheral and coronary arterial calcification on imaging studies performed for workup of prostate cancer, AAA and incidentally diagnosed aneurysm of the ascending aorta on chest CT. He received a dual-chamber permanent pacemaker for sinus bradycardia with chronotropic incompetence in October 2019 [employer]).  The patient specifically denies any chest pain at rest exertion, dyspnea at rest or with exertion, orthopnea, paroxysmal nocturnal dyspnea, syncope, palpitations, focal neurological deficits, intermittent claudication, lower extremity edema, unexplained weight gain, cough, hemoptysis or wheezing.  Most recent evaluation shows that his abdominal aortic aneurysm measures 3.6 cm and the ascending aortic aneurysm measures 4.2 cm.  He is scheduled for repeat aortic CT angiography in May 2021.  In September 2017 his nuclear vasodilator stress test was a low risk study with a small/mild area of ischemia in the apex. EF 59%.   Pacemaker interrogation shows normal device function.  He has roughly 70% atrial pacing and <1% ventricular pacing.  Lead parameters are excellent.  At this point estimated generator longevity is 12.7 years.  He has had 18 episodes of high ventricular response.  Most of these are atrial tachycardia, lasting less than 2 seconds, mostly brief PAT, no atrial fibrillation. A couple of episodes are NSVT, one episode about 23-beats long (actually 3 brief back-to-back NSVT). Heart rate histogram distribution appears appropriate for his level of activity.  Lead parameters are all within desirable range.   He is now on second line  chemotherapy for prostate cancer refractory to antiandrogen therapy, with excellent response to Zytiga.   Past Medical History:  Diagnosis Date  . Gastric outlet obstruction 02/03/2018   [name] 02/03/2018  . GERD (gastroesophageal reflux disease)   . History of hiatal hernia    small/notes 02/03/2018  . Hypertension    no meds  . Presence of permanent cardiac pacemaker   . Prostate cancer ([place]) 02/08/14   Gleason 4+5=9, PSA 15.65  . Radiation   . Sinus problem   . SSS (sick sinus syndrome) [facility])     Past Surgical History:  Procedure Laterality Date  . APPENDECTOMY  2019   hx hematoma s/p appendectomy  . ESOPHAGOGASTRODUODENOSCOPY (EGD) WITH PROPOFOL N/A 02/03/2018   Procedure: ESOPHAGOGASTRODUODENOSCOPY (EGD) WITH PROPOFOL;  Surgeon: Clarene Essex, MD;  Location: [facility];  Service: Endoscopy;  Laterality: N/A;  . FRACTURE SURGERY    . INSERT / REPLACE / REMOVE PACEMAKER  06/14/2018  . LAPAROSCOPIC APPENDECTOMY N/A 01/21/2018   Procedure: APPENDECTOMY LAPAROSCOPIC;  Surgeon: Kinsinger, Arta Bruce, MD;  Location: [facility];  Service: General;  Laterality: N/A;  . PACEMAKER IMPLANT N/A 06/14/2018   Procedure: PACEMAKER IMPLANT - Dual Chamber;  Surgeon: Sanda Klein, MD;  Location: [facility];  Service: Cardiovascular;  Laterality: N/A;  . PROSTATE BIOPSY  02/08/14   gleason 4+5=9, 12/12 cores positive, 54 gm  . RADIOACTIVE SEED IMPLANT N/A 06/29/2014   Procedure: RADIOACTIVE SEED IMPLANT;  Surgeon: Bernestine Amass, MD;  Location: [facility];  Service: Urology;  Laterality: N/A;  . WRIST FRACTURE SURGERY Right 1980s    Current Medications: Outpatient Medications Prior to Visit  Medication Sig Dispense Refill  . abiraterone acetate (ZYTIGA) 250 MG tablet TAKE 4 TABLETS (1,000 MG TOTAL) BY MOUTH DAILY. TAKE ON AN EMPTY STOMACH 1 HOUR BEFORE OR 2 HOURS AFTER A MEAL 120 tablet 2  . acetaminophen (TYLENOL) 500 MG tablet Take 500 mg by mouth every 6 (six) hours as  needed (for pain).    [name] albuterol (VENTOLIN HFA) 108 (90 Base) MCG/ACT inhaler Inhale 2 puffs into the lungs every 6 (six) hours as needed for wheezing or shortness of breath. 8.5 g 0  . amLODipine (NORVASC) 2.5 MG tablet Take 1 tablet (2.5 mg total) by mouth daily. 90 tablet 2  . aspirin EC 81 MG tablet Take 81 mg by mouth daily.    [name] atorvastatin (LIPITOR) 20 MG tablet Take 1 tablet (20 mg total) by mouth daily. 90 tablet 3  . carbamide peroxide (DEBROX) 6.5 % OTIC solution Place 5 drops into both ears daily as needed (for earwax).    . citalopram (CELEXA) 10 MG tablet Take 1 tablet (10 mg total) by mouth daily. 90 tablet 2  . diclofenac sodium (VOLTAREN) 1 % GEL Apply 4 g topically 4 (four) times daily. 4 Tube 3  . fluticasone (FLONASE) 50 MCG/ACT nasal spray PLACE 1 SPRAY INTO BOTH NOSTRILS DAILY AS NEEDED FOR ALLERGIES OR RHINITIS (AT BEDTIME). 48 mL 1  . furosemide (LASIX) 20 MG tablet Take 20 mg by mouth daily for 5 days, then daily as needed for edema 90 tablet 1  . LORazepam (ATIVAN) 1 MG tablet Take 0.5 tablets (0.5 mg total) by mouth 2 (two) times daily as needed. 30 tablet 3  . Multiple Vitamin (MULTIVITAMIN WITH MINERALS) TABS tablet Take 0.5 tablets by mouth every other day. Centrum    . omeprazole (PRILOSEC) 20 MG capsule Take 1 capsule (20 mg total) by mouth daily before breakfast. 90 capsule 3  . polyethylene glycol powder (GLYCOLAX/MIRALAX) powder Take 17 g by mouth daily as needed (constipation.).    [name] potassium chloride SA (KLOR-CON M20) 20 MEQ tablet Take 1 tablet (20 mEq total) by mouth 2 (two) times daily. 180 tablet 0  . predniSONE (DELTASONE) 5 MG tablet TAKE 1 TABLET BY MOUTH EVERY DAY WITH BREAKFAST 90 tablet 0  . WIXELA INHUB 100-50 MCG/DOSE AEPB INHALE 1 PUFF INTO LUNGS TWICE DAILY 180 each 0  . metoprolol tartrate (LOPRESSOR) 25 MG tablet Take 0.5 tablets (12.5 mg total) by mouth 2 (two) times daily. 180 tablet 3   No facility-administered medications prior to visit.      Allergies:   Penicillins   Social History   Socioeconomic History  . Marital status: Married    Spouse name: Not on file  . Number of children: 3  . Years of education: Not on file  . Highest education level: Not on file  Occupational History  . 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 Smoking status: Former Smoker    Years: 60.00    Types: Cigars    Quit date: 09/14/2012    Years since quitting: 7.0  . Smokeless tobacco: Never Used  . Tobacco comment: little cigars; the small ones"  Substance and Sexual Activity  . Alcohol use: Yes    Alcohol/week: 14.0 standard drinks    Types: 14 Cans of beer per week  . Drug use: No  . Sexual activity: Not Currently  Other Topics Concern  .  Not on file  Social History Narrative   Lives with wife and 2 dogs in one level home; daughter and her family co-habitate.   Has three daughters, all in [place], supportive. Five grandchildren.   Wants to return to doing silver sneakers once gym re-opens.         Social Determinants of Health   Financial Resource Strain: Low Risk   . Difficulty of Paying Living Expenses: Not hard at all  Food Insecurity: No Food Insecurity  . Worried About Charity fundraiser in the Last Year: Never true  . Ran Out of Food in the Last Year: Never true  Transportation Needs: No Transportation Needs  . Lack of Transportation (Medical): No  . Lack of Transportation (Non-Medical): No  Physical Activity: Inactive  . Days of Exercise per Week: 0 days  . Minutes of Exercise per Session: 0 min  Stress: No Stress Concern Present  . Feeling of Stress : Only a little  Social Connections: Slightly Isolated  . Frequency of Communication with Friends and Family: More than three times a week  . Frequency of Social Gatherings with Friends and Family: More than three times a week  . Attends Religious Services: More than 4  times per year  . Active Member of Clubs or Organizations: No  . Attends Archivist Meetings: Never  . Marital Status: Married     Family History:  The patient's family history includes Alzheimer's disease in his father and mother; Arthritis in his sister; Cancer in his brother and father.   ROS:   Please see the history of present illness.    ROS All other systems are reviewed and are negative.  PHYSICAL EXAM:   VS:  BP 122/72 (BP Location: Right Arm, Patient Position: Sitting, Cuff Size: Normal)   Pulse 81   Temp 98.1 F (36.7 C)   Ht 5\' 9"  (1.753 m)   Wt 223 lb (101.2 kg)   BMI 32.93 kg/m     General: Alert, oriented x3, no distress, obese, healthy left subclavian pacemaker site Head: no evidence of trauma, PERRL, EOMI, no exophtalmos or lid lag, no myxedema, no xanthelasma; normal ears, nose and oropharynx Neck: normal jugular venous pulsations and no hepatojugular reflux; brisk carotid pulses without delay and no carotid bruits Chest: clear to auscultation, no signs of consolidation by percussion or palpation, normal fremitus, symmetrical and full respiratory excursions Cardiovascular: normal position and quality of the apical impulse, regular rhythm, normal first and second heart sounds, no murmurs, rubs or gallops Abdomen: no tenderness or distention, no masses by palpation, no abnormal pulsatility or arterial bruits, normal bowel sounds, no hepatosplenomegaly Extremities: no clubbing, cyanosis or edema; 2+ radial, ulnar and brachial pulses bilaterally; 2+ right femoral, posterior tibial and dorsalis pedis pulses; 2+ left femoral, posterior tibial and dorsalis pedis pulses; no subclavian or femoral bruits Neurological: grossly nonfocal Psych: Normal mood and affect     Wt Readings from Last 3 Encounters:  09/18/19 223 lb (101.2 kg)  06/16/19 217 lb 14.4 oz (98.8 kg)  05/17/19 217 lb 4 oz (98.5 kg)      Studies/Labs Reviewed:   EKG:  EKG is ordered today.   Shows A paced, V sensed rhythm, long AV delay 266 ms, RBBB+ LAFB, inferolateral T wave inversion, QRS is 148 ms, QTc 527 ms.  Recent Labs: 09/19/2019: ALT 15; BUN 22; Creatinine 1.11; Hemoglobin 15.2; Platelet Count 171; Potassium 4.4; Sodium 142   Lipid Panel    Component Value  Date/Time   CHOL 194 06/15/2018 0222   TRIG 82 06/15/2018 0222   HDL 53 06/15/2018 0222   CHOLHDL 3.7 06/15/2018 0222   VLDL 16 06/15/2018 0222   LDLCALC 125 (H) 06/15/2018 0222   ASSESSMENT:    1. SSS (sick sinus syndrome) ([place])   2. Pacemaker   3. Trifascicular block   4. Atherosclerosis of native coronary artery of native heart without angina pectoris   5. Ascending aortic aneurysm ([place])   6. 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[place])   7. Hypercholesterolemia   8. Essential hypertension   9. Malignant neoplasm of prostate ([place])      PLAN:  In order of problems listed above:  1. Sinus bradycardia/SSS: symptoms well controlled with current device settings. 2. PPM: normal device function, remote downloads q3 months. 3. RBBB+LAFB+long PR: fortunately very little V pacing, despite beta blcokers. 4. CAD: Low risk stress test 2017 but with a small area of ischemia. Does not have angina.  He takes carvedilol and amlodipine which may be functioning as antianginal medications.  Invasive evaluation does not appear to be indicated.   5. Ascending aortic aneurysm: Moderate in size and asymptomatic. Stable in size. Scheduled for CT angiography in May. 6. AAA: Stable in size.  Plan to monitor it with CT angiography at the same time as his ascending aneurysm.  7. HLP: Target LDL-C preferably less than 70.  Due a repeat lipid profile on statin therapy. 8. HTN: Excellent control. 9. Prostate Ca: good response to treatment with Zytiga.  PSA undetectable.   Medication Adjustments/Labs and Tests Ordered: Current medicines are reviewed at length with the patient today.  Concerns regarding medicines are  outlined above.  Medication changes, Labs and Tests ordered today are listed in the Patient Instructions below. Patient Instructions  Medication Instructions:  No changes *If you need a refill on your cardiac medications before your next appointment, please call your pharmacy*  Lab Work: None ordered If you have labs (blood work) drawn today and your tests are completely normal, you will receive your results only by: [name] MyChart Message (if you have MyChart) OR . A paper copy in the mail If you have any lab test that is abnormal or we need to change your treatment, we will call you to review the results.  Testing/Procedures: None ordered  Follow-Up: At [facility], you and your health needs are our priority.  As part of our continuing mission to provide you with exceptional heart care, we have created designated Provider Care Teams.  These Care Teams include your primary Cardiologist (physician) and Advanced Practice Providers (APPs -  Physician Assistants and Nurse Practitioners) who all work together to provide you with the care you need, when you need it.  Your next appointment:   12 month(s)  The format for your next appointment:   In Person  Provider:   Sanda Klein, MD      Signed, Sanda Klein, MD  09/20/2019 4:27 PM    [address], [place], [place]  [address] Phone: [phone]; Fax: [phone]

## 2019-09-19 ENCOUNTER — Other Ambulatory Visit: Payer: Self-pay | Admitting: Family Medicine

## 2019-09-19 ENCOUNTER — Inpatient Hospital Stay: Payer: Medicare Other | Attending: Oncology

## 2019-09-19 ENCOUNTER — Telehealth: Payer: Self-pay | Admitting: Pharmacy Technician

## 2019-09-19 ENCOUNTER — Other Ambulatory Visit: Payer: Self-pay | Admitting: Oncology

## 2019-09-19 DIAGNOSIS — E876 Hypokalemia: Secondary | ICD-10-CM | POA: Diagnosis not present

## 2019-09-19 DIAGNOSIS — C61 Malignant neoplasm of prostate: Secondary | ICD-10-CM | POA: Diagnosis present

## 2019-09-19 DIAGNOSIS — D751 Secondary polycythemia: Secondary | ICD-10-CM | POA: Insufficient documentation

## 2019-09-19 DIAGNOSIS — I10 Essential (primary) hypertension: Secondary | ICD-10-CM | POA: Insufficient documentation

## 2019-09-19 DIAGNOSIS — J439 Emphysema, unspecified: Secondary | ICD-10-CM

## 2019-09-19 DIAGNOSIS — Z5111 Encounter for antineoplastic chemotherapy: Secondary | ICD-10-CM | POA: Insufficient documentation

## 2019-09-19 LAB — CUP PACEART REMOTE DEVICE CHECK
Battery Remaining Longevity: 154 mo
Battery Voltage: 3.04 V
Brady Statistic AP VP Percent: 0.05 %
Brady Statistic AP VS Percent: 84.5 %
Brady Statistic AS VP Percent: 0.01 %
Brady Statistic AS VS Percent: 15.44 %
Brady Statistic RA Percent Paced: 84.25 %
Brady Statistic RV Percent Paced: 0.07 %
Date Time Interrogation Session: 20210104012632
Implantable Lead Implant Date: 20191001
Implantable Lead Implant Date: 20191001
Implantable Lead Location: 753859
Implantable Lead Location: 753860
Implantable Lead Model: 5076
Implantable Lead Model: 5076
Implantable Pulse Generator Implant Date: 20191001
Lead Channel Impedance Value: 323 Ohm
Lead Channel Impedance Value: 323 Ohm
Lead Channel Impedance Value: 437 Ohm
Lead Channel Impedance Value: 494 Ohm
Lead Channel Pacing Threshold Amplitude: 0.5 V
Lead Channel Pacing Threshold Amplitude: 0.75 V
Lead Channel Pacing Threshold Pulse Width: 0.4 ms
Lead Channel Pacing Threshold Pulse Width: 0.4 ms
Lead Channel Sensing Intrinsic Amplitude: 2.875 mV
Lead Channel Sensing Intrinsic Amplitude: 2.875 mV
Lead Channel Sensing Intrinsic Amplitude: 3.375 mV
Lead Channel Sensing Intrinsic Amplitude: 3.375 mV
Lead Channel Setting Pacing Amplitude: 1.5 V
Lead Channel Setting Pacing Amplitude: 2.5 V
Lead Channel Setting Pacing Pulse Width: 0.4 ms
Lead Channel Setting Sensing Sensitivity: 0.9 mV

## 2019-09-19 LAB — CBC WITH DIFFERENTIAL (CANCER CENTER ONLY)
Abs Immature Granulocytes: 0.03 10*3/uL (ref 0.00–0.07)
Basophils Absolute: 0.1 10*3/uL (ref 0.0–0.1)
Basophils Relative: 1 %
Eosinophils Absolute: 0.1 10*3/uL (ref 0.0–0.5)
Eosinophils Relative: 1 %
HCT: 44.7 % (ref 39.0–52.0)
Hemoglobin: 15.2 g/dL (ref 13.0–17.0)
Immature Granulocytes: 0 %
Lymphocytes Relative: 16 %
Lymphs Abs: 1.2 10*3/uL (ref 0.7–4.0)
MCH: 31.6 pg (ref 26.0–34.0)
MCHC: 34 g/dL (ref 30.0–36.0)
MCV: 92.9 fL (ref 80.0–100.0)
Monocytes Absolute: 0.6 10*3/uL (ref 0.1–1.0)
Monocytes Relative: 8 %
Neutro Abs: 5.7 10*3/uL (ref 1.7–7.7)
Neutrophils Relative %: 74 %
Platelet Count: 171 10*3/uL (ref 150–400)
RBC: 4.81 MIL/uL (ref 4.22–5.81)
RDW: 12.4 % (ref 11.5–15.5)
WBC Count: 7.6 10*3/uL (ref 4.0–10.5)
nRBC: 0 % (ref 0.0–0.2)

## 2019-09-19 LAB — CMP (CANCER CENTER ONLY)
ALT: 15 U/L (ref 0–44)
AST: 19 U/L (ref 15–41)
Albumin: 4.1 g/dL (ref 3.5–5.0)
Alkaline Phosphatase: 85 U/L (ref 38–126)
Anion gap: 11 (ref 5–15)
BUN: 22 mg/dL (ref 8–23)
CO2: 25 mmol/L (ref 22–32)
Calcium: 8.8 mg/dL — ABNORMAL LOW (ref 8.9–10.3)
Chloride: 106 mmol/L (ref 98–111)
Creatinine: 1.11 mg/dL (ref 0.61–1.24)
GFR, Est AFR Am: >60 mL/min (ref >60)
GFR, Estimated: >60 mL/min (ref >60)
Glucose, Bld: 111 mg/dL — ABNORMAL HIGH (ref 70–99)
Potassium: 4.4 mmol/L (ref 3.5–5.1)
Sodium: 142 mmol/L (ref 135–145)
Total Bilirubin: 0.8 mg/dL (ref 0.3–1.2)
Total Protein: 6.9 g/dL (ref 6.5–8.1)

## 2019-09-19 NOTE — Telephone Encounter (Signed)
Oral Oncology Patient Advocate Encounter  Patient has been approved for copay assistance with [employer] (TAF).  [employer] will cover all copayment expenses for Zytiga for the remainder of the calendar year.    The billing information is as follows and has been shared with [facility] [place].   Member ID: [id number] Group ID:  [id number] PCN: [id number] BIN: [id number] Eligibility Dates: 09/15/2019 to 09/13/2020  Fund: [place] [facility] [place] Phone [phone] Fax [phone] 09/19/2019 12:44 PM

## 2019-09-20 ENCOUNTER — Encounter: Payer: Self-pay | Admitting: Cardiovascular Disease

## 2019-09-20 LAB — PROSTATE-SPECIFIC AG, SERUM (LABCORP): Prostate Specific Ag, Serum: <0.1 ng/mL (ref 0.0–4.0)

## 2019-09-21 ENCOUNTER — Inpatient Hospital Stay: Payer: Medicare Other

## 2019-09-21 ENCOUNTER — Other Ambulatory Visit: Payer: Self-pay

## 2019-09-21 ENCOUNTER — Inpatient Hospital Stay (HOSPITAL_BASED_OUTPATIENT_CLINIC_OR_DEPARTMENT_OTHER): Payer: Medicare Other | Admitting: Oncology

## 2019-09-21 VITALS — BP 122/89 | HR 91 | Temp 98.0°F | Resp 18

## 2019-09-21 VITALS — BP 115/86 | HR 82 | Temp 98.0°F | Resp 18 | Ht 69.0 in | Wt 223.4 lb

## 2019-09-21 DIAGNOSIS — C61 Malignant neoplasm of prostate: Secondary | ICD-10-CM | POA: Diagnosis not present

## 2019-09-21 DIAGNOSIS — I10 Essential (primary) hypertension: Secondary | ICD-10-CM | POA: Diagnosis not present

## 2019-09-21 DIAGNOSIS — E876 Hypokalemia: Secondary | ICD-10-CM | POA: Diagnosis not present

## 2019-09-21 DIAGNOSIS — D751 Secondary polycythemia: Secondary | ICD-10-CM | POA: Diagnosis not present

## 2019-09-21 DIAGNOSIS — Z5111 Encounter for antineoplastic chemotherapy: Secondary | ICD-10-CM | POA: Diagnosis not present

## 2019-09-21 MED ORDER — LEUPROLIDE ACETATE (3 MONTH) 22.5 MG ~~LOC~~ KIT
22.5000 mg | PACK | Freq: Once | SUBCUTANEOUS | Status: AC
  Administered 2019-09-21: 22.5 mg via SUBCUTANEOUS
  Filled 2019-09-21: qty 22.5

## 2019-09-21 NOTE — Patient Instructions (Signed)

## 2019-09-21 NOTE — Progress Notes (Signed)
Hematology and Oncology Follow Up Visit  Tony [name] [id number] [birth year] 79 y.o. 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  He initially presented with localized disease with Gleason score 4+5 = 9 and a PSA 15.7 in 2015.   Prior Therapy:  He is status post radiation therapy for external beam radiation completed in September 2015 for a total of 45 gray. He also status post seed implant brachytherapy boost completed in October 2015.   He received total of 1 year of androgen deprivation 22,015 and 2016. His PSA nadir was to 0.47 in November 2016. His PSA was 4.08 in May 2017.  Current therapy:  Androgen deprivation in the form of Lupron 22.5 mg started on 03/02/2016.  Last injection given on 03/16/2019.  Zytiga 1000 mg with prednisone started in August 2017.   Interim History: Mr. Zecher is here for return evaluation.  Since the last visit, he reports no major changes in his health.  He continues to tolerate Zytiga without major complaints.  He denies any nausea, fatigue or abdominal pain.  He does report weight gain and increase in his overall appetite.  He denies any bone pain or pathological fractures.  Continues to ambulate without any major difficulties.         Medications: Reviewed without any changes. Current Outpatient Medications  Medication Sig Dispense Refill  . abiraterone acetate (ZYTIGA) 250 MG tablet TAKE 4 TABLETS (1,000 MG TOTAL) BY MOUTH DAILY. TAKE ON AN EMPTY STOMACH 1 HOUR BEFORE OR 2 HOURS AFTER A MEAL 120 tablet 2  . acetaminophen (TYLENOL) 500 MG tablet Take 500 mg by mouth every 6 (six) hours as needed (for pain).    [name] albuterol (VENTOLIN HFA) 108 (90 Base) MCG/ACT inhaler Inhale 2 puffs into the lungs every 6 (six) hours as needed for wheezing or shortness of breath. 8.5 g 0  . amLODipine (NORVASC) 2.5 MG tablet Take 1 tablet (2.5 mg  total) by mouth daily. 90 tablet 2  . aspirin EC 81 MG tablet Take 81 mg by mouth daily.    [name] atorvastatin (LIPITOR) 20 MG tablet Take 1 tablet (20 mg total) by mouth daily. 90 tablet 3  . carbamide peroxide (DEBROX) 6.5 % OTIC solution Place 5 drops into both ears daily as needed (for earwax).    . citalopram (CELEXA) 10 MG tablet Take 1 tablet (10 mg total) by mouth daily. 90 tablet 2  . diclofenac sodium (VOLTAREN) 1 % GEL Apply 4 g topically 4 (four) times daily. 4 Tube 3  . fluticasone (FLONASE) 50 MCG/ACT nasal spray PLACE 1 SPRAY INTO BOTH NOSTRILS DAILY AS NEEDED FOR ALLERGIES OR RHINITIS (AT BEDTIME). 48 mL 1  . furosemide (LASIX) 20 MG tablet Take 20 mg by mouth daily for 5 days, then daily as needed for edema 90 tablet 1  . LORazepam (ATIVAN) 1 MG tablet Take 0.5 tablets (0.5 mg total) by mouth 2 (two) times daily as needed. 30 tablet 3  . metoprolol tartrate (LOPRESSOR) 25 MG tablet Take 0.5 tablets (12.5 mg total) by mouth 2 (two) times daily. 180 tablet 3  . Multiple Vitamin (MULTIVITAMIN WITH MINERALS) TABS tablet Take 0.5 tablets by mouth every other day. Centrum    . omeprazole (PRILOSEC) 20 MG capsule Take 1 capsule (20 mg total) by mouth daily before breakfast. 90 capsule 3  . polyethylene glycol powder (GLYCOLAX/MIRALAX) powder Take 17 g by mouth daily  as needed (constipation.).    [name] potassium chloride SA (KLOR-CON M20) 20 MEQ tablet Take 1 tablet (20 mEq total) by mouth 2 (two) times daily. 180 tablet 0  . predniSONE (DELTASONE) 5 MG tablet TAKE 1 TABLET BY MOUTH EVERY DAY WITH BREAKFAST 90 tablet 0  . WIXELA INHUB 100-50 MCG/DOSE AEPB INHALE 1 PUFF INTO LUNGS TWICE DAILY 180 each 1   No current facility-administered medications for this visit.     Allergies:  Allergies  Allergen Reactions  . Penicillins Rash    Has patient had a PCN reaction causing immediate rash, facial/tongue/throat swelling, SOB or lightheadedness with hypotension: Yes Has patient had a PCN  reaction causing severe rash involving mucus membranes or skin necrosis: No Has patient had a PCN reaction that required hospitalization: No Has patient had a PCN reaction occurring within the last 10 years: No If all of the above answers are "NO", then may proceed with Cephalosporin use.    Past Medical History, Surgical history, Social history, and Family History unchanged by review.   Physical Exam:    Blood pressure 115/86, pulse 82, temperature 98 F (36.7 C), temperature source Temporal, resp. rate 18, height 5\' 9"  (1.753 m), weight 223 lb 6.4 oz (101.3 kg), SpO2 94 %.     ECOG: 1    General appearance: Alert, awake without any distress. Head: Atraumatic without abnormalities Oropharynx: Without any thrush or ulcers. Eyes: No scleral icterus. Lymph nodes: No lymphadenopathy noted in the cervical, supraclavicular, or axillary nodes Heart:regular rate and rhythm, without any murmurs or gallops.   Lung: Clear to auscultation without any rhonchi, wheezes or dullness to percussion. Abdomin: Soft, nontender without any shifting dullness or ascites. Musculoskeletal: No clubbing or cyanosis. Neurological: No motor or sensory deficits. Skin: No rashes or lesions.         Lab Results: Lab Results  Component Value Date   WBC 7.6 09/19/2019   HGB 15.2 09/19/2019   HCT 44.7 09/19/2019   MCV 92.9 09/19/2019   PLT 171 09/19/2019     Chemistry      Component Value Date/Time   NA 142 09/19/2019 1240   NA 139 02/03/2019 0857   NA 141 08/27/2017 0915   K 4.4 09/19/2019 1240   K 4.8 08/27/2017 0915   CL 106 09/19/2019 1240   CO2 25 09/19/2019 1240   CO2 26 08/27/2017 0915   BUN 22 09/19/2019 1240   BUN 13 02/03/2019 0857   BUN 16.2 08/27/2017 0915   CREATININE 1.11 09/19/2019 1240   CREATININE 0.9 08/27/2017 0915   GLU 105 01/10/2016 0000      Component Value Date/Time   CALCIUM 8.8 (L) 09/19/2019 1240   CALCIUM 9.8 08/27/2017 0915   ALKPHOS 85 09/19/2019  1240   ALKPHOS 83 08/27/2017 0915   AST 19 09/19/2019 1240   AST 23 08/27/2017 0915   ALT 15 09/19/2019 1240   ALT 17 08/27/2017 0915   BILITOT 0.8 09/19/2019 1240   BILITOT 0.95 08/27/2017 0915       Results for JEREMEE, DOMEN (MRN [id number]) as of 09/21/2019 10:17  Ref. Range 09/19/2019 12:40  Prostate Specific Ag, Serum Latest Ref Range: 0.0 - 4.0 ng/mL <0.1      Impression and Plan:   79 year old man with:  1.  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      He continues to tolerate Zytiga reasonably well with excellent PSA response.  His PSA remains undetectable on September 19, 2019.  Risks and benefits of continuing this medication long-term reviewed.  Complications such as hypertension, adrenal insufficiency and edema were discussed.  Alternative options such as systemic chemotherapy will be deferred if he develops measurable worsening metastatic disease.  2. Androgen depravation: Risks and benefits of continuing Eligard were discussed.  Complication such as weight gain, hot flashes or sexual dysfunction were reviewed.  He received injection today and repeated in 3 months.   3. Polycythemia: Related to previous smoking and hemoglobin is back to normal at this time.  4. Hypokalemia: Related to Zytiga with normalization in his potassium at this time.  5. Hypertension: Related to Zytiga and appears to be close to normal range at this time.  6.  Prognosis and goals of care: Therapy remains aggressive at this time given his reasonable performance status.  His disease remains incurable however.   7. Follow-up: In 3 months for repeat evaluation.  30  minutes was spent on this encounter.  The time was dedicated to reviewing laboratory data, disease status update, treatment options and complications related to his current therapy.   Zola Button, MD 1/7/202110:15 AM

## 2019-09-22 ENCOUNTER — Telehealth: Payer: Self-pay | Admitting: Oncology

## 2019-09-22 NOTE — Telephone Encounter (Signed)
Scheduled appt per 1/7 los.  Sent a message to HIM pool to get a calendar mailed out. 

## 2019-09-27 MED FILL — ABIRATERONE ACETATE 250 MG: 250 | 30 days supply | Qty: 120 | Fill #2

## 2019-10-03 ENCOUNTER — Other Ambulatory Visit: Payer: Self-pay | Admitting: Family Medicine

## 2019-10-03 DIAGNOSIS — F418 Other specified anxiety disorders: Secondary | ICD-10-CM

## 2019-10-03 NOTE — Telephone Encounter (Signed)
Last Refill: 09/04/19 Last Office Visit: 05/17/19 Next Office Visit: 11/14/19 Med Contract: Not on file

## 2019-10-06 ENCOUNTER — Telehealth: Payer: Self-pay | Admitting: Family Medicine

## 2019-10-06 NOTE — Telephone Encounter (Signed)
Rx has been sent in by pcp.

## 2019-10-06 NOTE — Telephone Encounter (Signed)
Patient needs a refill for a 90 day supply of Lorezepam called in.  He only has 1/2 pill left.  Pharmacy: [facility] in [place]

## 2019-10-20 ENCOUNTER — Other Ambulatory Visit: Payer: Self-pay | Admitting: Oncology

## 2019-10-20 DIAGNOSIS — C61 Malignant neoplasm of prostate: Secondary | ICD-10-CM

## 2019-10-23 ENCOUNTER — Telehealth: Payer: Self-pay

## 2019-10-23 NOTE — Telephone Encounter (Signed)
Oral Oncology Patient Advocate Encounter   Was successful in securing patient an $40 grant from [facility]) to provide copayment coverage for Zytiga.  This will keep the out of pocket expense at $0.     I have spoken with the patient.    The billing information is as follows and has been shared with [place].   Member ID: [id number]  Group ID: [id number] RxBin: [id number] Dates of Eligibility: 10/23/19 through 10/21/20  Fund:  [place] [facility] [place] Phone [phone] Fax [phone] 10/23/2019 1:33 PM

## 2019-10-25 MED FILL — ABIRATERONE ACETATE 250 MG: 250 | 30 days supply | Qty: 120 | Fill #0

## 2019-11-13 ENCOUNTER — Other Ambulatory Visit: Payer: Self-pay

## 2019-11-14 ENCOUNTER — Encounter: Payer: Self-pay | Admitting: Family Medicine

## 2019-11-14 ENCOUNTER — Ambulatory Visit (INDEPENDENT_AMBULATORY_CARE_PROVIDER_SITE_OTHER): Payer: Medicare Other | Admitting: Family Medicine

## 2019-11-14 DIAGNOSIS — I10 Essential (primary) hypertension: Secondary | ICD-10-CM

## 2019-11-14 DIAGNOSIS — E669 Obesity, unspecified: Secondary | ICD-10-CM | POA: Insufficient documentation

## 2019-11-14 DIAGNOSIS — R7303 Prediabetes: Secondary | ICD-10-CM | POA: Insufficient documentation

## 2019-11-14 DIAGNOSIS — E876 Hypokalemia: Secondary | ICD-10-CM | POA: Diagnosis not present

## 2019-11-14 DIAGNOSIS — R600 Localized edema: Secondary | ICD-10-CM

## 2019-11-14 DIAGNOSIS — F418 Other specified anxiety disorders: Secondary | ICD-10-CM

## 2019-11-14 DIAGNOSIS — J439 Emphysema, unspecified: Secondary | ICD-10-CM | POA: Diagnosis not present

## 2019-11-14 MED ORDER — FLUTICASONE-SALMETEROL 100-50 MCG/DOSE IN AEPB: 1.0000 | INHALATION_SPRAY | Freq: Two times a day (BID) | RESPIRATORY_TRACT | 3 refills | Status: DC

## 2019-11-14 MED ORDER — AMLODIPINE BESYLATE 2.5 MG PO TABS: 2.5000 mg | ORAL_TABLET | Freq: Every day | ORAL | 2 refills | Status: DC

## 2019-11-14 MED ORDER — METOPROLOL TARTRATE 25 MG PO TABS: 25.0000 mg | ORAL_TABLET | Freq: Two times a day (BID) | ORAL | 2 refills | Status: DC

## 2019-11-14 NOTE — Patient Instructions (Addendum)
A few things to remember from today's visit:   Pulmonary emphysema, unspecified emphysema type ([place]), Chronic  Essential hypertension, benign - Plan: amLODipine (NORVASC) 2.5 MG tablet  Lorazepam increased from 1/2 tab 2 times daily to 1 tab 2 times daily as needed. Rest no changes.  Please be sure medication list is accurate. If a new problem present, please set up appointment sooner than planned today.

## 2019-11-14 NOTE — Assessment & Plan Note (Addendum)
Lorazepam increased from 0.5 mg bid to 1 mg twice daily as needed. Continue Celexa 10 mg daily. We discussed some side effects of medication. Follow-up in 5 to 6 months, before if needed.

## 2019-11-14 NOTE — Progress Notes (Signed)
HPI:   [name] is a 79 y.o. male, who is here today with his wife for 6 months follow up.   He was last seen on 05/17/19. Since his last visit he has followed with his oncologist.  Prediabetes: Negative for polydipsia,polyuria, or polyphagia.  Lab Results  Component Value Date   HGBA1C 6.2 02/13/2019   C/O increased appetite therefore gaining wt around abdomen. He is on chronic prednisone treatment and Lupron for prostate cancer.   Anxiety and depression:  He is on Lorazepam 0.5 mg 1/2 tab bid and Celexa 10 mg daily.  For the past 2 months he feels like his anxiety is getting worse. He would like to increased Lorazepam dose. He has taken higher doses before but decrease it when he felt like problem was better.  Tolerating medication well,no side effects reported.  HTN: He is on Amlodipine 2.5 mg daily and Metoprolol Tartrate 25 mg bid. Negative for severe/frequent headache, visual changes, chest pain, dyspnea, focal weakness, or worsening edema.  + Abdominal aortic aneurysm abdominal and ascending aorta. Sinus sick synd, s/p pacemaker placement. Follows with cardiologist.  Lab Results  Component Value Date   CREATININE 1.11 09/19/2019   BUN 22 09/19/2019   NA 142 09/19/2019   K 4.4 09/19/2019   CL 106 09/19/2019   CO2 25 09/19/2019    Sleep is interrupted by urinary frequency. 3-4 times nocturia, he falls asleep when he goes back to bed. He is on Furosemide 20 mg daily for LE edema. Negative for dysuria or gross hematuria.  HypoK+, he is on KLOR 20 meq daily.  COPD: He would like to go back to Advair because [place] Ruts is expensive. He has not needed Albuterol inh.  Review of Systems  Constitutional: Positive for fatigue. Negative for activity change, appetite change and fever.  HENT: Negative for nosebleeds and sore throat.   Respiratory: Negative for cough and wheezing.   Gastrointestinal: Negative for abdominal pain, nausea and  vomiting.  Genitourinary: Negative for decreased urine volume, dysuria and hematuria.  Skin: Negative for rash and wound.  Neurological: Negative for syncope and facial asymmetry.  Psychiatric/Behavioral: Positive for sleep disturbance. Negative for confusion. The patient is nervous/anxious.   Rest of ROS, see pertinent positives sand negatives in HPI  Current Outpatient Medications on File Prior to Visit  Medication Sig Dispense Refill  . abiraterone acetate (ZYTIGA) 250 MG tablet TAKE 4 TABLETS (1,000 MG TOTAL) BY MOUTH DAILY. TAKE ON AN EMPTY STOMACH 1 HOUR BEFORE OR 2 HOURS AFTER A MEAL 120 tablet 2  . acetaminophen (TYLENOL) 500 MG tablet Take 500 mg by mouth every 6 (six) hours as needed (for pain).    [name] albuterol (VENTOLIN HFA) 108 (90 Base) MCG/ACT inhaler Inhale 2 puffs into the lungs every 6 (six) hours as needed for wheezing or shortness of breath. 8.5 g 0  . aspirin EC 81 MG tablet Take 81 mg by mouth daily.    . carbamide peroxide (DEBROX) 6.5 % OTIC solution Place 5 drops into both ears daily as needed (for earwax).    . citalopram (CELEXA) 10 MG tablet Take 1 tablet (10 mg total) by mouth daily. 90 tablet 2  . diclofenac sodium (VOLTAREN) 1 % GEL Apply 4 g topically 4 (four) times daily. 4 Tube 3  . fluticasone (FLONASE) 50 MCG/ACT nasal spray PLACE 1 SPRAY INTO BOTH NOSTRILS DAILY AS NEEDED FOR ALLERGIES OR RHINITIS (AT BEDTIME). 48 mL 1  . furosemide (LASIX) 20  MG tablet Take 20 mg by mouth daily for 5 days, then daily as needed for edema 90 tablet 1  . Multiple Vitamin (MULTIVITAMIN WITH MINERALS) TABS tablet Take 0.5 tablets by mouth every other day. Centrum    . omeprazole (PRILOSEC) 20 MG capsule Take 1 capsule (20 mg total) by mouth daily before breakfast. 90 capsule 3  . polyethylene glycol powder (GLYCOLAX/MIRALAX) powder Take 17 g by mouth daily as needed (constipation.).    [name] potassium chloride SA (KLOR-CON M20) 20 MEQ tablet Take 1 tablet (20 mEq total) by mouth 2  (two) times daily. 180 tablet 0  . predniSONE (DELTASONE) 5 MG tablet TAKE 1 TABLET BY MOUTH EVERY DAY WITH BREAKFAST 90 tablet 0  . atorvastatin (LIPITOR) 20 MG tablet Take 1 tablet (20 mg total) by mouth daily. 90 tablet 3   No current facility-administered medications on file prior to visit.     Past Medical History:  Diagnosis Date  . Gastric outlet obstruction 02/03/2018   Tony [name] 02/03/2018  . GERD (gastroesophageal reflux disease)   . History of hiatal hernia    small/notes 02/03/2018  . Hypertension    no meds  . Presence of permanent cardiac pacemaker   . Prostate cancer ([place]) 02/08/14   Gleason 4+5=9, PSA 15.65  . Radiation   . Sinus problem   . SSS (sick sinus syndrome) ([facility])    Allergies  Allergen Reactions  . Penicillins Rash    Has patient had a PCN reaction causing immediate rash, facial/tongue/throat swelling, SOB or lightheadedness with hypotension: Yes Has patient had a PCN reaction causing severe rash involving mucus membranes or skin necrosis: No Has patient had a PCN reaction that required hospitalization: No Has patient had a PCN reaction occurring within the last 10 years: No If all of the above answers are "NO", then may proceed with Cephalosporin use.    Social History   Socioeconomic History  . Marital status: Married    Spouse name: Not on file  . Number of children: 3  . Years of education: Not on file  . Highest education level: Not on file  Occupational History  . 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 Smoking status: Former Smoker    Years: 60.00    Types: Cigars    Quit date: 09/14/2012    Years since quitting: 7.1  . Smokeless tobacco: Never Used  . Tobacco comment: little cigars; the small ones"  Substance and Sexual Activity  . Alcohol use: Yes    Alcohol/week: 14.0 standard drinks    Types: 14 Cans of beer per week  . Drug use: No   . Sexual activity: Not Currently  Other Topics Concern  . Not on file  Social History Narrative   Lives with wife and 2 dogs in one level home; daughter and her family co-habitate.   Has three daughters, all in [place], supportive. Five grandchildren.   Wants to return to doing silver sneakers once gym re-opens.         Social Determinants of Health   Financial Resource Strain: Low Risk   . Difficulty of Paying Living Expenses: Not hard at all  Food Insecurity: No Food Insecurity  . Worried About Charity fundraiser in the Last Year: Never true  . Ran Out of Food in the Last Year: Never true  Transportation Needs: No Transportation Needs  .  Lack of Transportation (Medical): No  . Lack of Transportation (Non-Medical): No  Physical Activity: Inactive  . Days of Exercise per Week: 0 days  . Minutes of Exercise per Session: 0 min  Stress: No Stress Concern Present  . Feeling of Stress : Only a little  Social Connections: Slightly Isolated  . Frequency of Communication with Friends and Family: More than three times a week  . Frequency of Social Gatherings with Friends and Family: More than three times a week  . Attends Religious Services: More than 4 times per year  . Active Member of Clubs or Organizations: No  . Attends Archivist Meetings: Never  . Marital Status: Married    Vitals:   11/14/19 0930  BP: 130/80  Pulse: 69  Resp: 16  Temp: (!) 96.1 F (35.6 C)  SpO2: 97%   Wt Readings from Last 3 Encounters:  11/14/19 227 lb (103 kg)  09/21/19 223 lb 6.4 oz (101.3 kg)  09/18/19 223 lb (101.2 kg)    Body mass index is 33.52 kg/m.   Physical Exam  Nursing note and vitals reviewed. Constitutional: He is oriented to person, place, and time. He appears well-developed. No distress.  HENT:  Head: Normocephalic and atraumatic.  Mouth/Throat: Mucous membranes are normal. He has dentures.  Eyes: Pupils are equal, round, and reactive to light. Conjunctivae are  normal.  Cardiovascular: Normal rate and regular rhythm.  No murmur heard. Pulses:      Dorsalis pedis pulses are 2+ on the right side and 2+ on the left side.  Respiratory: Effort normal and breath sounds normal. No respiratory distress.  GI: Soft. He exhibits no mass. There is no hepatomegaly. There is no abdominal tenderness.  Musculoskeletal:        General: Edema (Trace pitting LE edema,bilateral) present.  Lymphadenopathy:    He has no cervical adenopathy.  Neurological: He is alert and oriented to person, place, and time. He has normal strength.  Gait mildly unstable,difficulty getting up from chair.  Skin: Skin is warm. No rash noted. No erythema.  Psychiatric: He has a normal mood and affect.  Well groomed, good eye contact.   ASSESSMENT AND PLAN:   [name] was seen today for 6 months follow-up.  Orders Placed This Encounter  Procedures  . Hemoglobin A1c    Essential hypertension, benign BP adequately controlled. Continue amlodipine 2.5 mg at bedtime and metoprolol tartrate 25 mg twice daily. Continue low-salt diet.  Emphysema lung ([place]) Problem has been well controlled. Because of cost he would like to change to prior inhaler, so Advair 100-50 mcg sent to his pharmacy to continue 1 puff twice daily.   Anxiety disorder Lorazepam increased from 0.5 mg bid to 1 mg twice daily as needed. Continue Celexa 10 mg daily. We discussed some side effects of medication. Follow-up in 5 to 6 months, before if needed.  Morbid obesity ([place]) Problem is aggravated by chronic use of prednisone. We discussed benefits of wt loss as well as adverse effects of obesity. Consistency with healthy diet and physical activity as tolerated.   Prediabetes On chronic Prednisone treatment and on Lupron. Healthy life style for primary prevention of diabetics. We will continue monitoring. HgA1C to be done with next blood drown.  Bilateral lower extremity edema Side  effects of Furosemide discussed. LE elevation and compression stocking will also help.  Return in about 6 months (around 05/16/2020) for HTN,anxiety,IFG.    [name]. [name], MD  [facility].  [place] office.

## 2019-11-14 NOTE — Assessment & Plan Note (Signed)
BP adequately controlled. Continue amlodipine 2.5 mg at bedtime and metoprolol tartrate 25 mg twice daily. Continue low-salt diet.

## 2019-11-14 NOTE — Assessment & Plan Note (Signed)
Problem is aggravated by chronic use of prednisone. We discussed benefits of wt loss as well as adverse effects of obesity. Consistency with healthy diet and physical activity as tolerated.

## 2019-11-14 NOTE — Assessment & Plan Note (Signed)
Problem has been well controlled. Because of cost he would like to change to prior inhaler, so Advair 100-50 mcg sent to his pharmacy to continue 1 puff twice daily.

## 2019-11-16 ENCOUNTER — Telehealth: Payer: Self-pay | Admitting: Family Medicine

## 2019-11-16 MED ORDER — LORAZEPAM 1 MG PO TABS: 1.0000 mg | ORAL_TABLET | Freq: Two times a day (BID) | ORAL | 2 refills | Status: DC | PRN

## 2019-11-16 NOTE — Telephone Encounter (Signed)
Medication Refill: Lorazepam  Pharmacy:  Phone:  Pt wife said prescription was suppose to be refilled yesterday per dr visit with Dr.[name]. She also wanted to make sure that pt will be taking a whole pill twice a day instead of a half of pill twice a day.

## 2019-11-17 NOTE — Telephone Encounter (Signed)
Rx has been sent in for 1 tab bid.

## 2019-11-26 ENCOUNTER — Other Ambulatory Visit: Payer: Self-pay | Admitting: Family Medicine

## 2019-11-26 DIAGNOSIS — R600 Localized edema: Secondary | ICD-10-CM

## 2019-11-30 MED FILL — ABIRATERONE ACETATE 250 MG: 250 | 30 days supply | Qty: 120 | Fill #1

## 2019-12-11 ENCOUNTER — Other Ambulatory Visit: Payer: Self-pay | Admitting: Cardiovascular Disease

## 2019-12-14 ENCOUNTER — Inpatient Hospital Stay: Payer: Medicare Other | Attending: Oncology

## 2019-12-14 ENCOUNTER — Other Ambulatory Visit: Payer: Self-pay

## 2019-12-14 DIAGNOSIS — E876 Hypokalemia: Secondary | ICD-10-CM | POA: Insufficient documentation

## 2019-12-14 DIAGNOSIS — I10 Essential (primary) hypertension: Secondary | ICD-10-CM | POA: Insufficient documentation

## 2019-12-14 DIAGNOSIS — C61 Malignant neoplasm of prostate: Secondary | ICD-10-CM

## 2019-12-14 DIAGNOSIS — Z5111 Encounter for antineoplastic chemotherapy: Secondary | ICD-10-CM | POA: Diagnosis not present

## 2019-12-14 LAB — CMP (CANCER CENTER ONLY)
ALT: 14 U/L (ref 0–44)
AST: 17 U/L (ref 15–41)
Albumin: 3.9 g/dL (ref 3.5–5.0)
Alkaline Phosphatase: 86 U/L (ref 38–126)
Anion gap: 11 (ref 5–15)
BUN: 18 mg/dL (ref 8–23)
CO2: 22 mmol/L (ref 22–32)
Calcium: 9.2 mg/dL (ref 8.9–10.3)
Chloride: 105 mmol/L (ref 98–111)
Creatinine: 1 mg/dL (ref 0.61–1.24)
GFR, Est AFR Am: >60 mL/min (ref >60)
GFR, Estimated: >60 mL/min (ref >60)
Glucose, Bld: 118 mg/dL — ABNORMAL HIGH (ref 70–99)
Potassium: 4.5 mmol/L (ref 3.5–5.1)
Sodium: 138 mmol/L (ref 135–145)
Total Bilirubin: 0.7 mg/dL (ref 0.3–1.2)
Total Protein: 7.1 g/dL (ref 6.5–8.1)

## 2019-12-14 LAB — CBC WITH DIFFERENTIAL (CANCER CENTER ONLY)
Abs Immature Granulocytes: 0.03 10*3/uL (ref 0.00–0.07)
Basophils Absolute: 0 10*3/uL (ref 0.0–0.1)
Basophils Relative: 1 %
Eosinophils Absolute: 0.1 10*3/uL (ref 0.0–0.5)
Eosinophils Relative: 2 %
HCT: 45 % (ref 39.0–52.0)
Hemoglobin: 14.8 g/dL (ref 13.0–17.0)
Immature Granulocytes: 0 %
Lymphocytes Relative: 16 %
Lymphs Abs: 1.1 10*3/uL (ref 0.7–4.0)
MCH: 31.2 pg (ref 26.0–34.0)
MCHC: 32.9 g/dL (ref 30.0–36.0)
MCV: 94.7 fL (ref 80.0–100.0)
Monocytes Absolute: 0.6 10*3/uL (ref 0.1–1.0)
Monocytes Relative: 8 %
Neutro Abs: 5.4 10*3/uL (ref 1.7–7.7)
Neutrophils Relative %: 73 %
Platelet Count: 195 10*3/uL (ref 150–400)
RBC: 4.75 MIL/uL (ref 4.22–5.81)
RDW: 13.1 % (ref 11.5–15.5)
WBC Count: 7.3 10*3/uL (ref 4.0–10.5)
nRBC: 0 % (ref 0.0–0.2)

## 2019-12-15 ENCOUNTER — Other Ambulatory Visit: Payer: Self-pay | Admitting: Oncology

## 2019-12-15 DIAGNOSIS — C61 Malignant neoplasm of prostate: Secondary | ICD-10-CM

## 2019-12-15 LAB — PROSTATE-SPECIFIC AG, SERUM (LABCORP): Prostate Specific Ag, Serum: <0.1 ng/mL (ref 0.0–4.0)

## 2019-12-18 ENCOUNTER — Ambulatory Visit (INDEPENDENT_AMBULATORY_CARE_PROVIDER_SITE_OTHER): Payer: Medicare Other | Admitting: *Deleted

## 2019-12-18 DIAGNOSIS — I495 Sick sinus syndrome: Secondary | ICD-10-CM | POA: Diagnosis not present

## 2019-12-18 LAB — CUP PACEART REMOTE DEVICE CHECK
Battery Remaining Longevity: 150 mo
Battery Voltage: 3.04 V
Brady Statistic AP VP Percent: 0.12 %
Brady Statistic AP VS Percent: 91.45 %
Brady Statistic AS VP Percent: 0.01 %
Brady Statistic AS VS Percent: 8.42 %
Brady Statistic RA Percent Paced: 91.15 %
Brady Statistic RV Percent Paced: 0.13 %
Date Time Interrogation Session: 20210405021300
Implantable Lead Implant Date: 20191001
Implantable Lead Implant Date: 20191001
Implantable Lead Location: 753859
Implantable Lead Location: 753860
Implantable Lead Model: 5076
Implantable Lead Model: 5076
Implantable Pulse Generator Implant Date: 20191001
Lead Channel Impedance Value: 323 Ohm
Lead Channel Impedance Value: 323 Ohm
Lead Channel Impedance Value: 475 Ohm
Lead Channel Impedance Value: 589 Ohm
Lead Channel Pacing Threshold Amplitude: 0.5 V
Lead Channel Pacing Threshold Amplitude: 0.875 V
Lead Channel Pacing Threshold Pulse Width: 0.4 ms
Lead Channel Pacing Threshold Pulse Width: 0.4 ms
Lead Channel Sensing Intrinsic Amplitude: 2.875 mV
Lead Channel Sensing Intrinsic Amplitude: 2.875 mV
Lead Channel Sensing Intrinsic Amplitude: 4 mV
Lead Channel Sensing Intrinsic Amplitude: 4 mV
Lead Channel Setting Pacing Amplitude: 1.5 V
Lead Channel Setting Pacing Amplitude: 2.5 V
Lead Channel Setting Pacing Pulse Width: 0.4 ms
Lead Channel Setting Sensing Sensitivity: 0.9 mV

## 2019-12-19 NOTE — Progress Notes (Signed)
PPM Remote  

## 2019-12-21 ENCOUNTER — Other Ambulatory Visit: Payer: Self-pay

## 2019-12-21 ENCOUNTER — Inpatient Hospital Stay (HOSPITAL_BASED_OUTPATIENT_CLINIC_OR_DEPARTMENT_OTHER): Payer: Medicare Other | Admitting: Oncology

## 2019-12-21 ENCOUNTER — Inpatient Hospital Stay: Payer: Medicare Other

## 2019-12-21 VITALS — BP 141/84 | HR 97 | Temp 97.8°F | Resp 18 | Ht 69.0 in | Wt 228.8 lb

## 2019-12-21 DIAGNOSIS — C61 Malignant neoplasm of prostate: Secondary | ICD-10-CM | POA: Diagnosis not present

## 2019-12-21 DIAGNOSIS — Z5111 Encounter for antineoplastic chemotherapy: Secondary | ICD-10-CM | POA: Diagnosis not present

## 2019-12-21 DIAGNOSIS — I10 Essential (primary) hypertension: Secondary | ICD-10-CM | POA: Diagnosis not present

## 2019-12-21 DIAGNOSIS — E876 Hypokalemia: Secondary | ICD-10-CM | POA: Diagnosis not present

## 2019-12-21 MED ORDER — LEUPROLIDE ACETATE (3 MONTH) 22.5 MG ~~LOC~~ KIT
22.5000 mg | PACK | Freq: Once | SUBCUTANEOUS | Status: AC
  Administered 2019-12-21: 22.5 mg via SUBCUTANEOUS
  Filled 2019-12-21: qty 22.5

## 2019-12-21 NOTE — Progress Notes (Signed)
Hematology and Oncology Follow Up Visit  [place] Camisa [id number] 1940/11/12 79 y.o. 12/21/2019 10:00 AM [name], [name], [name], [name], MD   Principle Diagnosis: 78 year old man with advanced prostate diagnosed in 2017.  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   Prior Therapy:  He is status post radiation therapy for external beam radiation completed in September 2015 for a total of 45 gray. He also status post seed implant brachytherapy boost completed in October 2015.   He received total of 1 year of androgen deprivation 22,015 and 2016. His PSA nadir was to 0.47 in November 2016. His PSA was 4.08 in May 2017.  Current therapy:  Eligard 22.5 mg every 3 months.  He is scheduled to receive next injection today.  Zytiga 1000 mg with prednisone started in August 2017.   Interim History: Mr. Sutherland returns today for a follow-up visit.  Since the last visit, he reports no major changes in his health.  He denies any complications related to Zytiga.  Not any nausea, vomiting or abdominal pain.  Denies any recent hospitalization or illnesses.  He denies any lower extremity swelling or joint pain.  He denies any pathological fractures.         Medications: Reviewed without any changes. Current Outpatient Medications  Medication Sig Dispense Refill  . abiraterone acetate (ZYTIGA) 250 MG tablet TAKE 4 TABLETS (1,000 MG TOTAL) BY MOUTH DAILY. TAKE ON AN EMPTY STOMACH 1 HOUR BEFORE OR 2 HOURS AFTER A MEAL 120 tablet 2  . acetaminophen (TYLENOL) 500 MG tablet Take 500 mg by mouth every 6 (six) hours as needed (for pain).    [name] albuterol (VENTOLIN HFA) 108 (90 Base) MCG/ACT inhaler Inhale 2 puffs into the lungs every 6 (six) hours as needed for wheezing or shortness of breath. 8.5 g 0  . amLODipine (NORVASC) 2.5 MG tablet Take 1 tablet (2.5 mg total) by mouth daily. 90 tablet  2  . aspirin EC 81 MG tablet Take 81 mg by mouth daily.    [name] atorvastatin (LIPITOR) 20 MG tablet TAKE 1 TABLET BY MOUTH EVERY DAY 90 tablet 2  . carbamide peroxide (DEBROX) 6.5 % OTIC solution Place 5 drops into both ears daily as needed (for earwax).    . citalopram (CELEXA) 10 MG tablet Take 1 tablet (10 mg total) by mouth daily. 90 tablet 2  . diclofenac sodium (VOLTAREN) 1 % GEL Apply 4 g topically 4 (four) times daily. 4 Tube 3  . fluticasone (FLONASE) 50 MCG/ACT nasal spray PLACE 1 SPRAY INTO BOTH NOSTRILS DAILY AS NEEDED FOR ALLERGIES OR RHINITIS (AT BEDTIME). 48 mL 1  . Fluticasone-Salmeterol (ADVAIR) 100-50 MCG/DOSE AEPB Inhale 1 puff into the lungs 2 (two) times daily. 1 each 3  . furosemide (LASIX) 20 MG tablet TAKE 1 TABLET BY MOUITH DAILY FOR 5 DAYS , THEN DAILY AS NEEDED FOR EDEMA 90 tablet 1  . LORazepam (ATIVAN) 1 MG tablet Take 1 tablet (1 mg total) by mouth 2 (two) times daily as needed. 60 tablet 2  . metoprolol tartrate (LOPRESSOR) 25 MG tablet Take 1 tablet (25 mg total) by mouth 2 (two) times daily. 180 tablet 2  . Multiple Vitamin (MULTIVITAMIN WITH MINERALS) TABS tablet Take 0.5 tablets by mouth every other day. Centrum    . omeprazole (PRILOSEC) 20 MG capsule Take 1 capsule (20 mg total) by mouth daily before breakfast. 90 capsule 3  .  polyethylene glycol powder (GLYCOLAX/MIRALAX) powder Take 17 g by mouth daily as needed (constipation.).    [name] potassium chloride SA (KLOR-CON M20) 20 MEQ tablet Take 1 tablet (20 mEq total) by mouth 2 (two) times daily. 180 tablet 0  . predniSONE (DELTASONE) 5 MG tablet TAKE 1 TABLET BY MOUTH EVERY DAY WITH BREAKFAST 90 tablet 0   No current facility-administered medications for this visit.     Allergies:  Allergies  Allergen Reactions  . Penicillins Rash    Has patient had a PCN reaction causing immediate rash, facial/tongue/throat swelling, SOB or lightheadedness with hypotension: Yes Has patient had a PCN reaction causing severe  rash involving mucus membranes or skin necrosis: No Has patient had a PCN reaction that required hospitalization: No Has patient had a PCN reaction occurring within the last 10 years: No If all of the above answers are "NO", then may proceed with Cephalosporin use.    Past Medical History, Surgical history, Social history, and Family History unchanged by review.   Physical Exam:    Blood pressure (!) 141/84, pulse 97, temperature 97.8 F (36.6 C), temperature source Temporal, resp. rate 18, height 5\' 9"  (1.753 m), weight 228 lb 12.8 oz (103.8 kg), SpO2 97 %.     ECOG: 1   General appearance: Comfortable appearing without any discomfort Head: Normocephalic without any trauma Oropharynx: Mucous membranes are moist and pink without any thrush or ulcers. Eyes: Pupils are equal and round reactive to light. Lymph nodes: No cervical, supraclavicular, inguinal or axillary lymphadenopathy.   Heart:regular rate and rhythm.  S1 and S2 without leg edema. Lung: Clear without any rhonchi or wheezes.  No dullness to percussion. Abdomin: Soft, nontender, nondistended with good bowel sounds.  No hepatosplenomegaly. Musculoskeletal: No joint deformity or effusion.  Full range of motion noted. Neurological: No deficits noted on motor, sensory and deep tendon reflex exam. Skin: No petechial rash or dryness.  Appeared moist.          Lab Results: Lab Results  Component Value Date   WBC 7.3 12/14/2019   HGB 14.8 12/14/2019   HCT 45.0 12/14/2019   MCV 94.7 12/14/2019   PLT 195 12/14/2019     Chemistry      Component Value Date/Time   NA 138 12/14/2019 1045   NA 139 02/03/2019 0857   NA 141 08/27/2017 0915   K 4.5 12/14/2019 1045   K 4.8 08/27/2017 0915   CL 105 12/14/2019 1045   CO2 22 12/14/2019 1045   CO2 26 08/27/2017 0915   BUN 18 12/14/2019 1045   BUN 13 02/03/2019 0857   BUN 16.2 08/27/2017 0915   CREATININE 1.00 12/14/2019 1045   CREATININE 0.9 08/27/2017 0915   GLU  105 01/10/2016 0000      Component Value Date/Time   CALCIUM 9.2 12/14/2019 1045   CALCIUM 9.8 08/27/2017 0915   ALKPHOS 86 12/14/2019 1045   ALKPHOS 83 08/27/2017 0915   AST 17 12/14/2019 1045   AST 23 08/27/2017 0915   ALT 14 12/14/2019 1045   ALT 17 08/27/2017 0915   BILITOT 0.7 12/14/2019 1045   BILITOT 0.95 08/27/2017 0915       Results for AARIC, DODGSON AVERY (MRN [id number]) as of 12/21/2019 10:00  Ref. Range 09/19/2019 12:40 12/14/2019 10:45  Prostate Specific Ag, Serum Latest Ref Range: 0.0 - 4.0 ng/mL <0.1 <0.1    Study Result  CLINICAL DATA:  Follow-up known aortic aneurysm.  EXAM: CT ANGIOGRAPHY CHEST, ABDOMEN AND PELVIS  TECHNIQUE:  Multidetector CT imaging through the chest, abdomen and pelvis was performed using the standard protocol during bolus administration of intravenous contrast. Multiplanar reconstructed images and MIPs were obtained and reviewed to evaluate the vascular anatomy.  CONTRAST:  139mL OMNIPAQUE IOHEXOL 350 MG/ML SOLN  COMPARISON:  02/02/2018  FINDINGS: CTA CHEST FINDINGS  Cardiovascular: Heart is normal size. Pacer wires noted in the right heart. Extensive coronary artery calcifications in all major coronary vessels including left main. Diffuse aortic atherosclerosis and tortuosity. Proximal ascending thoracic aorta mildly aneurysmal at 4.2 cm, stable. Aortic arch measures 3.5 cm. Distal descending thoracic aorta 3.1 cm.  Mediastinum/Nodes: No mediastinal, hilar, or axillary adenopathy.  Lungs/Pleura: Severe emphysema. Biapical scarring. No confluent opacities or effusions.  Musculoskeletal: Chest wall soft tissues are unremarkable. No acute bony abnormality.  Review of the MIP images confirms the above findings.  CTA ABDOMEN AND PELVIS FINDINGS  VASCULAR  Aorta: Mild aneurysmal dilatation of the abdominal aorta measuring 3.6 cm below the level of the renal arteries. Moderate calcified and noncalcified plaque  throughout the abdominal aorta.  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 Mild poststenotic dilatation.  SMA: Atherosclerotic calcifications.  No stenosis or dissection.  Renals: Single renal arteries bilaterally. Moderate calcified plaque at the origins bilaterally. No significant stenosis.  IMA: Occluded proximally, filling via collaterals.  Inflow: Iliac atherosclerosis. No aneurysm, dissection or significant stenosis.  Veins: No obvious venous abnormality within the limitations of this arterial phase study.  Review of the MIP images confirms the above findings.  NON-VASCULAR  Hepatobiliary: No focal hepatic abnormality. Gallbladder unremarkable.  Pancreas: Diffuse pancreatic atrophy and fatty replacement. No focal abnormality or ductal dilatation.  Spleen: No focal abnormality.  Normal size.  Adrenals/Urinary Tract: 5.5 cm cyst off the lower pole of the left kidney is stable and appears simple. No hydronephrosis. No adrenal mass. Urinary bladder grossly unremarkable.  Stomach/Bowel: Sigmoid diverticulosis. No active diverticulitis. Stomach and small bowel decompressed, unremarkable.  Lymphatic: No adenopathy.  Reproductive: Radiation seeds in the prostate.  Other: Bilateral inguinal hernias containing fat. No free fluid or free air.  Musculoskeletal: No acute bony abnormality. Degenerative changes throughout the lumbar spine.  Review of the MIP images confirms the above findings.  IMPRESSION: 4.2 cm proximal ascending thoracic aortic aneurysm, stable since prior study. 3.6 cm infrarenal abdominal aortic aneurysm. Recommend annual imaging followup by CTA or MRA. This recommendation follows 2010 ACCF/AHA/AATS/ACR/ASA/SCA/SCAI/SIR/STS/SVM Guidelines for the Diagnosis and Management of Patients with Thoracic Aortic Disease. Circulation. 2010; [id number]. Aortic aneurysm NOS (ICD10-I71.9)  Extensive  coronary artery disease.  Severe emphysema.  No acute cardiopulmonary disease.  Sigmoid diverticulosis.  No active diverticulitis.  No acute findings in the abdomen or pelvis.      Impression and Plan:   79 year old man with:  1.  Advanced prostate cancer with biochemical relapse only diagnosed in 2017.  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    His PSA continues to be undetectable on Zytiga.  Imaging studies obtained in May 2020 but no evidence of metastatic disease.  Risks and benefits of continuing this treatment long-term were discussed.  Potential complication occluding hypertension, adrenal insufficiency and hypokalemia were reviewed.  He is agreeable to continue at this time.  He is planned to have repeat imaging studies in May 2021 for his aneurysm.  Different salvage therapy option including systemic chemotherapy will be reserved if he has measurable metastatic disease with progression.  2. Androgen depravation: He is currently on Eligard which she will  receive every 3 months.  Long-term complications including weight gain, hot flashes osteoporosis were reviewed.  Will receive it today and repeat in 3 months.   3. Polycythemia: Resolved at this time.  Previously related to smoking with normal hemoglobin.  4. Hypokalemia: Potassium is back to normal range at this time without any supplementation needed.  This is related to Zytiga.  5. Hypertension: His blood pressure is within normal range and will continue to monitor on Zytiga.  6.  Prognosis and goals of care: His disease is incurable although aggressive measures are warranted given his excellent performance status.   7. Follow-up: He will return in 3 months for repeat evaluation and next injection.  30  minutes were dedicated to this visit.  The time was spent on reviewing previous imaging studies, current laboratory data, disease status update and future plan of care discussion.   Zola Button,  MD 4/8/202110:00 AM

## 2019-12-21 NOTE — Patient Instructions (Signed)

## 2019-12-22 ENCOUNTER — Telehealth: Payer: Self-pay | Admitting: Oncology

## 2019-12-22 NOTE — Telephone Encounter (Signed)
Scheduled appt per 4/8 los.  Printed and mailed appt calendar

## 2020-01-01 MED FILL — ABIRATERONE ACETATE 250 MG: 250 | 30 days supply | Qty: 120 | Fill #2

## 2020-01-02 ENCOUNTER — Telehealth: Payer: Self-pay | Admitting: Oncology

## 2020-01-02 NOTE — Telephone Encounter (Signed)
Called pt per 4/19 sch message - unable to reach pt - left message for patient to call back if reschedule still needed

## 2020-01-19 ENCOUNTER — Ambulatory Visit (INDEPENDENT_AMBULATORY_CARE_PROVIDER_SITE_OTHER)
Admission: RE | Admit: 2020-01-19 | Discharge: 2020-01-19 | Disposition: A | Discharge status: Home / Self Care | Payer: Medicare Other | Source: Ambulatory Visit | Attending: Cardiovascular Disease | Admitting: Cardiovascular Disease

## 2020-01-19 ENCOUNTER — Other Ambulatory Visit: Payer: Self-pay

## 2020-01-19 DIAGNOSIS — I10 Essential (primary) hypertension: Secondary | ICD-10-CM | POA: Diagnosis not present

## 2020-01-19 DIAGNOSIS — I453 Trifascicular block: Secondary | ICD-10-CM

## 2020-01-19 DIAGNOSIS — I714 Abdominal aortic aneurysm, without rupture, unspecified: Secondary | ICD-10-CM

## 2020-01-19 DIAGNOSIS — I495 Sick sinus syndrome: Secondary | ICD-10-CM

## 2020-01-19 DIAGNOSIS — I712 Thoracic aortic aneurysm, without rupture, unspecified: Secondary | ICD-10-CM

## 2020-01-19 DIAGNOSIS — E7800 Pure hypercholesterolemia, unspecified: Secondary | ICD-10-CM

## 2020-01-19 DIAGNOSIS — Z950 Presence of cardiac pacemaker: Secondary | ICD-10-CM

## 2020-01-19 DIAGNOSIS — I2510 Atherosclerotic heart disease of native coronary artery without angina pectoris: Secondary | ICD-10-CM

## 2020-01-19 MED ORDER — IOHEXOL 350 MG/ML SOLN
100.0000 mL | Freq: Once | INTRAVENOUS | Status: AC | PRN
  Administered 2020-01-19: 100 mL via INTRAVENOUS

## 2020-01-23 ENCOUNTER — Other Ambulatory Visit: Payer: Self-pay | Admitting: Oncology

## 2020-01-23 DIAGNOSIS — C61 Malignant neoplasm of prostate: Secondary | ICD-10-CM

## 2020-01-24 ENCOUNTER — Other Ambulatory Visit: Payer: Self-pay

## 2020-01-24 ENCOUNTER — Telehealth (INDEPENDENT_AMBULATORY_CARE_PROVIDER_SITE_OTHER): Payer: Medicare Other | Admitting: Surgery

## 2020-01-24 DIAGNOSIS — I712 Thoracic aortic aneurysm, without rupture: Secondary | ICD-10-CM | POA: Diagnosis not present

## 2020-01-25 ENCOUNTER — Encounter: Payer: Self-pay | Admitting: Surgery

## 2020-01-25 NOTE — Progress Notes (Signed)
Patient ID: Tony [name], male   DOB: [birth year], 79 y.o.   MRN: [id number]      [address]       ,[place] [address]             [phone]     CARDIOTHORACIC SURGERY TELEPHONE VIRTUAL OFFICE NOTE  Referring Provider is [name], [name], MD Primary Cardiologist is Tony Klein, MD PCP is [name], [name], MD   HPI:  I spoke with Tony [name] (DOB [birth year] ) via telephone on 01/24/2020 at 3:00 PM and verified that I was speaking with the correct person using more than one form of identification.  We discussed the reason(s) for conducting our visit virtually instead of in-person.  The patient expressed understanding the circumstances and agreed to proceed as described.   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  He also had a 3.1 cm infrarenal abdominal aortic aneurysm.  I recommended follow-up CTA of the chest in 1 year.  CTA of the chest last May 2020 showed a maximum diameter of the ascending aorta measuring 4.5 cm.  He said that he continues to feel well without any chest or back pain.   Current Outpatient Medications  Medication Sig Dispense Refill  . abiraterone acetate (ZYTIGA) 250 MG tablet TAKE 4 TABLETS (1,000 MG TOTAL) BY MOUTH DAILY. TAKE ON AN EMPTY STOMACH 1 HOUR BEFORE OR 2 HOURS AFTER A MEAL 120 tablet 2  . acetaminophen (TYLENOL) 500 MG tablet Take 500 mg by mouth every 6 (six) hours as needed (for pain).    [name] albuterol (VENTOLIN HFA) 108 (90 Base) MCG/ACT inhaler Inhale 2 puffs into the lungs every 6 (six) hours as needed for wheezing or shortness of breath. 8.5 [name] 0  . amLODipine (NORVASC) 2.5 MG tablet Take 1 tablet (2.5 mg total) by mouth daily. 90 tablet 2  . aspirin EC 81 MG tablet Take 81 mg by mouth daily.    [name] atorvastatin (LIPITOR) 20 MG tablet TAKE 1 TABLET BY MOUTH EVERY DAY 90 tablet 2  . carbamide peroxide  (DEBROX) 6.5 % OTIC solution Place 5 drops into both ears daily as needed (for earwax).    . citalopram (CELEXA) 10 MG tablet Take 1 tablet (10 mg total) by mouth daily. 90 tablet 2  . diclofenac sodium (VOLTAREN) 1 % GEL Apply 4 [name] topically 4 (four) times daily. 4 Tube 3  . fluticasone (FLONASE) 50 MCG/ACT nasal spray PLACE 1 SPRAY INTO BOTH NOSTRILS DAILY AS NEEDED FOR ALLERGIES OR RHINITIS (AT BEDTIME). 48 mL 1  . Fluticasone-Salmeterol (ADVAIR) 100-50 MCG/DOSE AEPB Inhale 1 puff into the lungs 2 (two) times daily. 1 each 3  . furosemide (LASIX) 20 MG tablet TAKE 1 TABLET BY MOUITH DAILY FOR 5 DAYS , THEN DAILY AS NEEDED FOR EDEMA 90 tablet 1  . LORazepam (ATIVAN) 1 MG tablet Take 1 tablet (1 mg total) by mouth 2 (two) times daily as needed. 60 tablet 2  . metoprolol tartrate (LOPRESSOR) 25 MG tablet Take 1 tablet (25 mg total) by mouth 2 (two) times daily. 180 tablet 2  . Multiple Vitamin (MULTIVITAMIN WITH MINERALS) TABS tablet Take 0.5 tablets by mouth every other day. Centrum    . omeprazole (PRILOSEC) 20 MG capsule Take 1 capsule (20 mg total) by mouth daily before breakfast. 90 capsule 3  . polyethylene glycol powder (GLYCOLAX/MIRALAX) powder Take  17 [name] by mouth daily as needed (constipation.).    [name] potassium chloride SA (KLOR-CON M20) 20 MEQ tablet Take 1 tablet (20 mEq total) by mouth 2 (two) times daily. 180 tablet 0  . predniSONE (DELTASONE) 5 MG tablet TAKE 1 TABLET BY MOUTH EVERY DAY WITH BREAKFAST 90 tablet 0   No current facility-administered medications for this visit.     Diagnostic Tests:  CLINICAL DATA:  Thoracic aortic aneurysm, follow-up  EXAM: CT ANGIOGRAPHY CHEST, ABDOMEN AND PELVIS  TECHNIQUE: Multidetector CT imaging through the chest, abdomen and pelvis was performed using the standard protocol during bolus administration of intravenous contrast. Multiplanar reconstructed images and MIPs were obtained and reviewed to evaluate the vascular  anatomy.  CONTRAST:  130mL OMNIPAQUE IOHEXOL 350 MG/ML SOLN  COMPARISON:  02/07/2019  FINDINGS: CTA CHEST FINDINGS  Cardiovascular: Left subclavian transvenous pacing leads extend into the right atrium and towards the right ventricular apex. Heart size upper limits normal. No significant pericardial effusion. Dilated central pulmonary arteries. Satisfactory opacification of pulmonary arteries noted, and there is no evidence of pulmonary emboli. Coronary calcifications. Good contrast opacification of the thoracic aorta.  Aortic Root:  --Valve: 2.5 cm  --Sinuses: 3.9 cm  --Sinotubular Junction: 3.2 cm  Limitations by motion: Mild  Thoracic Aorta:  --Ascending Aorta:   4.6 cm (stable by my measurement)  --Aortic Arch: 4.2 cm  --Descending Aorta: 3.5 cm  Classic 3 vessel brachiocephalic arterial origin anatomy without proximal stenosis. Scattered calcified aortic plaque. Mild tortuosity of the descending thoracic segment.  Mediastinum/Nodes: No hilar or mediastinal adenopathy.  Lungs/Pleura: No pleural effusion. No pneumothorax. Emphysema (ICD10-J43.9). Stable pleuroparenchymal scarring at the left apex. Right middle lobe calcified granuloma image 62/5. No new infiltrate or nodule.  Musculoskeletal: Right shoulder DJD. Anterior vertebral endplate spurring at multiple levels in the lower thoracic spine. No fracture or worrisome bone lesion.  Review of the MIP images confirms the above findings.  CTA ABDOMEN AND PELVIS FINDINGS  VASCULAR  Aorta: 3.8 cm infrarenal fusiform aneurysm with some eccentric nonocclusive mural thrombus (previously 3.6). No dissection or stenosis.  Celiac: Near-occlusive ostial stenosis extending over length of 2 cm, patent distally with unremarkable distal branch anatomy.  SMA: Calcified ostial plaque without significant stenosis. Classic distal branch anatomy.  Renals: Single left, patent.  Single right,  patent.  IMA: Short segment origin occlusion, reconstituted distally by visceral collaterals.  Inflow: Scattered atheromatous plaque in the bilateral iliac arterial systems without aneurysm, dissection, or stenosis. Mild tortuosity.  Veins: No obvious venous abnormality within the limitations of this arterial phase study.  Review of the MIP images confirms the above findings.  NON-VASCULAR  Hepatobiliary: No focal liver abnormality is seen. No gallstones, gallbladder wall thickening, or biliary dilatation.  Pancreas: Marked parenchymal atrophy without mass or ductal dilatation.  Spleen: Normal in size without focal abnormality.  Adrenals/Urinary Tract: Adrenal glands unremarkable. 5.3 cm exophytic lower pole left renal cyst. No hydronephrosis. Urinary bladder incompletely distended. For  Stomach/Bowel: Stomach is nondistended. Small bowel is nondilated. Appendix surgically absent. Colon is nondilated with scattered distal descending and sigmoid diverticula; no significant adjacent inflammatory/edematous change or abscess.  Lymphatic: No abdominal or pelvic adenopathy.  Reproductive: Metallic seeds around the prostate.  Other: No ascites.  No free air.  Musculoskeletal: Bilateral inguinal hernias containing only fat. Multilevel lumbar spondylitic change. No fracture or worrisome bone lesion.  Review of the MIP images confirms the above findings.  IMPRESSION: 1. 4.6 cm ascending thoracic aortic aneurysm without complicating features. Recommend semi-annual  imaging followup by CTA or MRA and referral to cardiothoracic surgery if not already obtained. This recommendation follows 2010 ACCF/AHA/AATS/ACR/ASA/SCA/SCAI/SIR/STS/SVM Guidelines for the Diagnosis and Management of Patients With Thoracic Aortic Disease. Circulation. 2010; 121: e266-e369 2. 3.8 cm infrarenal abdominal aortic aneurysm. Recommend followup by ultrasound in 2 years. This  recommendation follows ACR consensus guidelines: White Paper of the ACR Incidental Findings Committee II on Vascular Findings. J Am Coll Radiol 2013; 10:789-794. 3. Coronary calcifications. The severity of coronary artery disease and any potential stenosis cannot be assessed on this non-gated CT examination. Assessment for potential risk factor modification, dietary therapy or pharmacologic therapy may be warranted, if clinically indicated. 4. 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 The SMA remains widely patent.   Electronically Signed   By: Lucrezia Europe M.D.   On: 01/19/2020 16:36    Impression:  This 79 year old gentleman has a stable 4.6 cm fusiform ascending aortic aneurysm.  This is still well below the surgical threshold of 5.5 cm.  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  He has no complaints to suggest intestinal ischemia.  His abdominal aortic aneurysm will require follow-up in 2 years.  I stressed the importance of continued good blood pressure control and preventing further enlargement of his ascending aorta and acute aortic dissection.  Plan:  I will plan to see him back in 1 year with a CTA of the chest, abdomen, and pelvis.    I discussed limitations of evaluation and management via telephone.  The patient was advised to call back for repeat telephone consultation or to seek an in-person evaluation if questions arise or the patient's clinical condition changes in any significant manner.  I spent 10 minutes of non-face-to-face time during the conduct of this telephone virtual office consultation.    Gaye Pollack, MD 01/24/2020

## 2020-01-26 ENCOUNTER — Other Ambulatory Visit: Payer: Self-pay | Admitting: *Deleted

## 2020-01-26 DIAGNOSIS — I712 Thoracic aortic aneurysm, without rupture, unspecified: Secondary | ICD-10-CM

## 2020-01-30 MED FILL — ABIRATERONE ACETATE 250 MG: 250 | 30 days supply | Qty: 120 | Fill #0

## 2020-02-14 ENCOUNTER — Telehealth: Payer: Self-pay | Admitting: Family Medicine

## 2020-02-14 NOTE — Progress Notes (Signed)
  Chronic Care Management   Outreach Note  02/14/2020 Name: Tony [name] MRN: [id number] DOB: August 20, 1941  Referred by: [name], [name], MD Reason for referral : No chief complaint on file.   A second unsuccessful telephone outreach was attempted today. The patient was referred to pharmacist for assistance with care management and care coordination. This note is not being shared with the patient for the following reason: To respect privacy (The patient or proxy has requested that the information not be shared).   Follow Up Plan:   [place]

## 2020-02-26 ENCOUNTER — Other Ambulatory Visit: Payer: Self-pay | Admitting: Family Medicine

## 2020-02-26 DIAGNOSIS — F418 Other specified anxiety disorders: Secondary | ICD-10-CM

## 2020-02-28 ENCOUNTER — Telehealth: Payer: Self-pay | Admitting: Family Medicine

## 2020-02-28 MED FILL — ABIRATERONE ACETATE 250 MG: 250 | 30 days supply | Qty: 120 | Fill #1

## 2020-02-28 NOTE — Progress Notes (Signed)
  Chronic Care Management   Outreach Note  02/28/2020 Name: Tony [name] MRN: [id number] DOB: 1941/06/29  Referred by: [name], [name], MD Reason for referral : Chronic Care Management (Initial CCM Outreach)   A second unsuccessful telephone outreach was attempted today. The patient was referred to pharmacist for assistance with care management and care coordination.  Follow Up Plan:  [place]

## 2020-03-03 ENCOUNTER — Other Ambulatory Visit: Payer: Self-pay | Admitting: Family Medicine

## 2020-03-03 DIAGNOSIS — F418 Other specified anxiety disorders: Secondary | ICD-10-CM

## 2020-03-05 ENCOUNTER — Telehealth: Payer: Self-pay | Admitting: Family Medicine

## 2020-03-05 NOTE — Chronic Care Management (AMB) (Signed)
  Chronic Care Management   Note  03/05/2020 Name: Kemuel Buchmann [facility] MRN: [id number] DOB: 06-Jun-1941  Odai Wimmer Stehle is a 79 y.o. year old male who is a primary care patient of [name], [name], MD. I reached out to [place] by phone today in response to a referral sent by Mr. Saifullah Jolley [place]li[place]'s PCP, [name], [name], MD.   Mr. Mena was given information about Chronic Care Management services today including:  1. CCM service includes personalized support from designated clinical staff supervised by his physician, including individualized plan of care and coordination with other care providers 2. 24/7 contact phone numbers for assistance for urgent and routine care needs. 3. Service will only be billed when office clinical staff spend 20 minutes or more in a month to coordinate care. 4. Only one practitioner may furnish and bill the service in a calendar month. 5. The patient may stop CCM services at any time (effective at the end of the month) by phone call to the office staff.   Patient agreed to services and verbal consent obtained.   Scheduled with his wife.  Follow up plan:   [place]

## 2020-03-06 ENCOUNTER — Other Ambulatory Visit: Payer: Self-pay | Admitting: Cardiovascular Disease

## 2020-03-07 ENCOUNTER — Other Ambulatory Visit: Payer: Self-pay | Admitting: Oncology

## 2020-03-07 DIAGNOSIS — C61 Malignant neoplasm of prostate: Secondary | ICD-10-CM

## 2020-03-15 ENCOUNTER — Telehealth: Payer: Self-pay | Admitting: Oncology

## 2020-03-15 NOTE — Telephone Encounter (Signed)
Rescheduled per providers pal, patient has been called and no voicemail was set up. Calender will be mailed.

## 2020-03-19 ENCOUNTER — Ambulatory Visit (INDEPENDENT_AMBULATORY_CARE_PROVIDER_SITE_OTHER): Payer: Medicare Other | Admitting: *Deleted

## 2020-03-19 DIAGNOSIS — I495 Sick sinus syndrome: Secondary | ICD-10-CM | POA: Diagnosis not present

## 2020-03-19 LAB — CUP PACEART REMOTE DEVICE CHECK
Battery Remaining Longevity: 146 mo
Battery Voltage: 3.03 V
Brady Statistic AP VP Percent: 0.04 %
Brady Statistic AP VS Percent: 91.85 %
Brady Statistic AS VP Percent: 0.01 %
Brady Statistic AS VS Percent: 8.1 %
Brady Statistic RA Percent Paced: 91.66 %
Brady Statistic RV Percent Paced: 0.04 %
Date Time Interrogation Session: 20210706043820
Implantable Lead Implant Date: 20191001
Implantable Lead Implant Date: 20191001
Implantable Lead Location: 753859
Implantable Lead Location: 753860
Implantable Lead Model: 5076
Implantable Lead Model: 5076
Implantable Pulse Generator Implant Date: 20191001
Lead Channel Impedance Value: 323 Ohm
Lead Channel Impedance Value: 323 Ohm
Lead Channel Impedance Value: 456 Ohm
Lead Channel Impedance Value: 551 Ohm
Lead Channel Pacing Threshold Amplitude: 0.5 V
Lead Channel Pacing Threshold Amplitude: 0.875 V
Lead Channel Pacing Threshold Pulse Width: 0.4 ms
Lead Channel Pacing Threshold Pulse Width: 0.4 ms
Lead Channel Sensing Intrinsic Amplitude: 3.125 mV
Lead Channel Sensing Intrinsic Amplitude: 3.125 mV
Lead Channel Sensing Intrinsic Amplitude: 3.75 mV
Lead Channel Sensing Intrinsic Amplitude: 3.75 mV
Lead Channel Setting Pacing Amplitude: 1.5 V
Lead Channel Setting Pacing Amplitude: 2.5 V
Lead Channel Setting Pacing Pulse Width: 0.4 ms
Lead Channel Setting Sensing Sensitivity: 0.9 mV

## 2020-03-20 NOTE — Progress Notes (Signed)
Remote pacemaker transmission.   

## 2020-03-21 ENCOUNTER — Other Ambulatory Visit: Payer: Medicare Other

## 2020-03-22 ENCOUNTER — Ambulatory Visit: Payer: Medicare Other

## 2020-03-22 ENCOUNTER — Ambulatory Visit: Payer: Medicare Other | Admitting: Oncology

## 2020-03-26 ENCOUNTER — Inpatient Hospital Stay: Payer: Medicare Other | Admitting: Oncology

## 2020-03-26 ENCOUNTER — Inpatient Hospital Stay: Payer: Medicare Other

## 2020-03-28 ENCOUNTER — Ambulatory Visit: Payer: Medicare Other | Admitting: Oncology

## 2020-03-28 ENCOUNTER — Ambulatory Visit: Payer: Medicare Other

## 2020-03-31 ENCOUNTER — Other Ambulatory Visit: Payer: Self-pay | Admitting: Oncology

## 2020-03-31 ENCOUNTER — Other Ambulatory Visit: Payer: Self-pay | Admitting: Family Medicine

## 2020-03-31 DIAGNOSIS — R059 Cough, unspecified: Secondary | ICD-10-CM

## 2020-03-31 DIAGNOSIS — R600 Localized edema: Secondary | ICD-10-CM

## 2020-03-31 DIAGNOSIS — C61 Malignant neoplasm of prostate: Secondary | ICD-10-CM

## 2020-04-01 MED FILL — ABIRATERONE ACETATE 250 MG: 250 | 30 days supply | Qty: 120 | Fill #2

## 2020-04-02 ENCOUNTER — Other Ambulatory Visit: Payer: Self-pay | Admitting: Oncology

## 2020-04-02 DIAGNOSIS — C61 Malignant neoplasm of prostate: Secondary | ICD-10-CM

## 2020-04-03 ENCOUNTER — Inpatient Hospital Stay: Payer: Medicare Other | Attending: Oncology

## 2020-04-03 ENCOUNTER — Inpatient Hospital Stay (HOSPITAL_BASED_OUTPATIENT_CLINIC_OR_DEPARTMENT_OTHER): Payer: Medicare Other | Admitting: Oncology

## 2020-04-03 ENCOUNTER — Inpatient Hospital Stay: Payer: Medicare Other

## 2020-04-03 ENCOUNTER — Other Ambulatory Visit: Payer: Self-pay

## 2020-04-03 VITALS — BP 137/97 | HR 92 | Temp 97.8°F | Resp 18 | Ht 69.0 in | Wt 228.7 lb

## 2020-04-03 DIAGNOSIS — Z5111 Encounter for antineoplastic chemotherapy: Secondary | ICD-10-CM | POA: Insufficient documentation

## 2020-04-03 DIAGNOSIS — C61 Malignant neoplasm of prostate: Secondary | ICD-10-CM | POA: Insufficient documentation

## 2020-04-03 DIAGNOSIS — E876 Hypokalemia: Secondary | ICD-10-CM | POA: Insufficient documentation

## 2020-04-03 DIAGNOSIS — I10 Essential (primary) hypertension: Secondary | ICD-10-CM | POA: Diagnosis not present

## 2020-04-03 LAB — CBC WITH DIFFERENTIAL (CANCER CENTER ONLY)
Abs Immature Granulocytes: 0.05 10*3/uL (ref 0.00–0.07)
Basophils Absolute: 0.1 10*3/uL (ref 0.0–0.1)
Basophils Relative: 1 %
Eosinophils Absolute: 0.2 10*3/uL (ref 0.0–0.5)
Eosinophils Relative: 3 %
HCT: 44.6 % (ref 39.0–52.0)
Hemoglobin: 14.6 g/dL (ref 13.0–17.0)
Immature Granulocytes: 1 %
Lymphocytes Relative: 14 %
Lymphs Abs: 1 10*3/uL (ref 0.7–4.0)
MCH: 30.5 pg (ref 26.0–34.0)
MCHC: 32.7 g/dL (ref 30.0–36.0)
MCV: 93.1 fL (ref 80.0–100.0)
Monocytes Absolute: 0.7 10*3/uL (ref 0.1–1.0)
Monocytes Relative: 9 %
Neutro Abs: 5.6 10*3/uL (ref 1.7–7.7)
Neutrophils Relative %: 72 %
Platelet Count: 173 10*3/uL (ref 150–400)
RBC: 4.79 MIL/uL (ref 4.22–5.81)
RDW: 13.1 % (ref 11.5–15.5)
WBC Count: 7.7 10*3/uL (ref 4.0–10.5)
nRBC: 0 % (ref 0.0–0.2)

## 2020-04-03 LAB — CMP (CANCER CENTER ONLY)
ALT: 17 U/L (ref 0–44)
AST: 19 U/L (ref 15–41)
Albumin: 4 g/dL (ref 3.5–5.0)
Alkaline Phosphatase: 87 U/L (ref 38–126)
Anion gap: 11 (ref 5–15)
BUN: 16 mg/dL (ref 8–23)
CO2: 27 mmol/L (ref 22–32)
Calcium: 10 mg/dL (ref 8.9–10.3)
Chloride: 104 mmol/L (ref 98–111)
Creatinine: 1.07 mg/dL (ref 0.61–1.24)
GFR, Est AFR Am: >60 mL/min (ref >60)
GFR, Estimated: >60 mL/min (ref >60)
Glucose, Bld: 115 mg/dL — ABNORMAL HIGH (ref 70–99)
Potassium: 4.6 mmol/L (ref 3.5–5.1)
Sodium: 142 mmol/L (ref 135–145)
Total Bilirubin: 1.1 mg/dL (ref 0.3–1.2)
Total Protein: 7.2 g/dL (ref 6.5–8.1)

## 2020-04-03 MED ORDER — LEUPROLIDE ACETATE (3 MONTH) 22.5 MG ~~LOC~~ KIT
PACK | SUBCUTANEOUS | Status: AC
  Filled 2020-04-03: qty 22.5

## 2020-04-03 MED ORDER — LEUPROLIDE ACETATE (3 MONTH) 22.5 MG ~~LOC~~ KIT
22.5000 mg | PACK | Freq: Once | SUBCUTANEOUS | Status: AC
  Administered 2020-04-03: 22.5 mg via SUBCUTANEOUS

## 2020-04-03 NOTE — Progress Notes (Signed)
Hematology and Oncology Follow Up Visit  Tony [name] [id number] 1940/12/14 79 y.o. 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  He presented with Gleason score 4+5 = 9 and a PSA 15.7 and localized disease in 2015.   Prior Therapy:  He is status post radiation therapy for external beam radiation completed in September 2015 for a total of 45 gray. He also status post seed implant brachytherapy boost completed in October 2015.   He received total of 1 year of androgen deprivation 22,015 and 2016. His PSA nadir was to 0.47 in November 2016. His PSA was 4.08 in May 2017.  Current therapy:  Eligard 22.5 mg every 3 months.  He will receive injection in July and repeated in 3 months.  Zytiga 1000 mg with prednisone started in August 2017.   Interim History: Tony [name] is here for a follow-up evaluation.  Since last visit, he reports no major changes in his health.  He continues to tolerate Zytiga without any major complications.  He denies any nausea, vomiting or abdominal pain.  He does report occasional bloating and increased gas.  He does report some mild fatigue but no bone pain or pathological fractures.         Medications: Updated without changes. Current Outpatient Medications  Medication Sig Dispense Refill  . abiraterone acetate (ZYTIGA) 250 MG tablet TAKE 4 TABLETS (1,000 MG TOTAL) BY MOUTH DAILY. TAKE ON AN EMPTY STOMACH 1 HOUR BEFORE OR 2 HOURS AFTER A MEAL 120 tablet 2  . acetaminophen (TYLENOL) 500 MG tablet Take 500 mg by mouth every 6 (six) hours as needed (for pain).    [name] albuterol (VENTOLIN HFA) 108 (90 Base) MCG/ACT inhaler Inhale 2 puffs into the lungs every 6 (six) hours as needed for wheezing or shortness of breath. 8.5 [name] 0  . amLODipine (NORVASC) 2.5 MG tablet Take 1 tablet (2.5 mg total) by mouth daily. 90 tablet 2  .  aspirin EC 81 MG tablet Take 81 mg by mouth daily.    [name] atorvastatin (LIPITOR) 20 MG tablet TAKE 1 TABLET BY MOUTH EVERY DAY 90 tablet 2  . carbamide peroxide (DEBROX) 6.5 % OTIC solution Place 5 drops into both ears daily as needed (for earwax).    . citalopram (CELEXA) 10 MG tablet TAKE 1 TABLET BY MOUTH EVERY DAY 90 tablet 3  . diclofenac sodium (VOLTAREN) 1 % GEL Apply 4 [name] topically 4 (four) times daily. 4 Tube 3  . fluticasone (FLONASE) 50 MCG/ACT nasal spray PLACE 1 SPRAY INTO BOTH NOSTRILS DAILY AS NEEDED FOR ALLERGIES OR RHINITIS (AT BEDTIME). 48 mL 1  . Fluticasone-Salmeterol (ADVAIR) 100-50 MCG/DOSE AEPB Inhale 1 puff into the lungs 2 (two) times daily. 1 each 3  . furosemide (LASIX) 20 MG tablet TAKE 1 TABLET BY MOUITH DAILY FOR 5 DAYS , THEN DAILY AS NEEDED FOR EDEMA 90 tablet 1  . LORazepam (ATIVAN) 1 MG tablet TAKE 1 TABLET (1 MG TOTAL) BY MOUTH 2 (TWO) TIMES DAILY AS NEEDED. 60 tablet 2  . metoprolol tartrate (LOPRESSOR) 25 MG tablet TAKE 1 TABLET BY MOUTH TWICE A DAY 180 tablet 2  . Multiple Vitamin (MULTIVITAMIN WITH MINERALS) TABS tablet Take 0.5 tablets by mouth every other day. Centrum    . omeprazole (PRILOSEC) 20 MG capsule Take 1 capsule (20 mg total) by mouth daily before breakfast. 90 capsule 3  . polyethylene glycol  powder (GLYCOLAX/MIRALAX) powder Take 17 [name] by mouth daily as needed (constipation.).    [name] potassium chloride SA (KLOR-CON M20) 20 MEQ tablet Take 1 tablet (20 mEq total) by mouth 2 (two) times daily. 180 tablet 0  . predniSONE (DELTASONE) 5 MG tablet TAKE 1 TABLET BY MOUTH EVERY DAY WITH BREAKFAST 90 tablet 0   No current facility-administered medications for this visit.     Allergies:  Allergies  Allergen Reactions  . Penicillins Rash    Has patient had a PCN reaction causing immediate rash, facial/tongue/throat swelling, SOB or lightheadedness with hypotension: Yes Has patient had a PCN reaction causing severe rash involving mucus membranes or skin  necrosis: No Has patient had a PCN reaction that required hospitalization: No Has patient had a PCN reaction occurring within the last 10 years: No If all of the above answers are "NO", then may proceed with Cephalosporin use.      Physical Exam:    Blood pressure (!) 137/97, pulse 92, temperature 97.8 F (36.6 C), temperature source Temporal, resp. rate 18, height 5\' 9"  (1.753 m), weight 228 lb 11.2 oz (103.7 kg), SpO2 96 %.      ECOG: 1    General appearance: Alert, awake without any distress. Head: Atraumatic without abnormalities Oropharynx: Without any thrush or ulcers. Eyes: No scleral icterus. Lymph nodes: No lymphadenopathy noted in the cervical, supraclavicular, or axillary nodes Heart:regular rate and rhythm, without any murmurs or gallops.   Lung: Clear to auscultation without any rhonchi, wheezes or dullness to percussion. Abdomin: Soft, nontender without any shifting dullness or ascites. Musculoskeletal: No clubbing or cyanosis. Neurological: No motor or sensory deficits. Skin: No rashes or lesions.          Lab Results: Lab Results  Component Value Date   WBC 7.3 12/14/2019   HGB 14.8 12/14/2019   HCT 45.0 12/14/2019   MCV 94.7 12/14/2019   PLT 195 12/14/2019     Chemistry      Component Value Date/Time   NA 138 12/14/2019 1045   NA 139 02/03/2019 0857   NA 141 08/27/2017 0915   K 4.5 12/14/2019 1045   K 4.8 08/27/2017 0915   CL 105 12/14/2019 1045   CO2 22 12/14/2019 1045   CO2 26 08/27/2017 0915   BUN 18 12/14/2019 1045   BUN 13 02/03/2019 0857   BUN 16.2 08/27/2017 0915   CREATININE 1.00 12/14/2019 1045   CREATININE 0.9 08/27/2017 0915   GLU 105 01/10/2016 0000      Component Value Date/Time   CALCIUM 9.2 12/14/2019 1045   CALCIUM 9.8 08/27/2017 0915   ALKPHOS 86 12/14/2019 1045   ALKPHOS 83 08/27/2017 0915   AST 17 12/14/2019 1045   AST 23 08/27/2017 0915   ALT 14 12/14/2019 1045   ALT 17 08/27/2017 0915   BILITOT 0.7  12/14/2019 1045   BILITOT 0.95 08/27/2017 0915         Results for Tony [name], Tony [name] (MRN [id number]) as of 04/03/2020 07:34  Ref. Range 09/19/2019 12:40 12/14/2019 10:45  Prostate Specific Ag, Serum Latest Ref Range: 0.0 - 4.0 ng/mL <0.1 <0.1    Impression and Plan:   79 year old man with:  1.  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   His PSA continues to be undetectable and has tolerated Zytiga reasonably well.  Risks and benefits of continuing this therapy long-term were reviewed.  Potential complications that include nausea, fatigue, hypertension and edema.  Alternative options such as systemic  chemotherapy will be deferred unless he developed aggression of disease.  His PSA currently pending from today but has been under excellent control.  He is agreeable to continue at this time.  2. Androgen depravation: He will receive Eligard today and repeated in 3 months.  Complication associated with this treatment include weight gain among others.  He is agreeable to continue.   3. Hypokalemia: Related to Zytiga and has resolved at this time.  4. Hypertension: Blood pressure close to normal range at this time.  5.  Prognosis and goals of care: Therapy remains palliative although aggressive measures are warranted given his excellent performance status and limited disease.   6. Follow-up: In 3 months for repeat evaluation next Eligard..  30  minutes were spent on this visit.  The time was dedicated to reviewing laboratory data, discussing disease status, options of treatment for the future as well as complication related to his current therapy.   Zola Button, MD 7/21/20217:32 AM

## 2020-04-03 NOTE — Patient Instructions (Signed)

## 2020-04-04 ENCOUNTER — Telehealth: Payer: Self-pay

## 2020-04-04 LAB — PROSTATE-SPECIFIC AG, SERUM (LABCORP): Prostate Specific Ag, Serum: <0.1 ng/mL (ref 0.0–4.0)

## 2020-04-04 NOTE — Telephone Encounter (Signed)
Spoke with patient's wife, made her aware of PSA results. She verbalized understanding and will let patient know.

## 2020-04-04 NOTE — Telephone Encounter (Signed)
-----   Message from Tami Lin, RN sent at 04/04/2020  9:38 AM EDT ----- Please see result note below ----- Message ----- From: Wyatt Portela, MD Sent: 04/04/2020   9:04 AM EDT To: Tami Lin, RN  Please let him know his PSA is still low

## 2020-04-09 ENCOUNTER — Telehealth: Payer: Self-pay | Admitting: Oncology

## 2020-04-09 NOTE — Telephone Encounter (Signed)
Scheduled per 07/21 los, patient has been called and voicemail was left. 

## 2020-04-17 ENCOUNTER — Other Ambulatory Visit: Payer: Self-pay

## 2020-04-17 DIAGNOSIS — J439 Emphysema, unspecified: Secondary | ICD-10-CM

## 2020-04-17 MED ORDER — ALBUTEROL SULFATE HFA 108 (90 BASE) MCG/ACT IN AERS: 2.0000 | INHALATION_SPRAY | Freq: Four times a day (QID) | RESPIRATORY_TRACT | 2 refills | Status: DC | PRN

## 2020-04-19 ENCOUNTER — Telehealth: Payer: Self-pay

## 2020-04-19 DIAGNOSIS — G4700 Insomnia, unspecified: Secondary | ICD-10-CM

## 2020-04-19 DIAGNOSIS — E7800 Pure hypercholesterolemia, unspecified: Secondary | ICD-10-CM

## 2020-04-19 NOTE — Telephone Encounter (Signed)
-----   Message from Germaine Pomfret, [facility] sent at 04/19/2020  9:27 AM EDT ----- Regarding: CCM Pharmacist Referral Roshawna Colclasure,  Can you please place a referral to chronic care management for this patient?  Thanks, Doristine Section Clinical Pharmacist [facility] [place]

## 2020-04-22 NOTE — Chronic Care Management (AMB) (Signed)
Chronic Care Management Pharmacy  Name: Tony [name]  MRN: [id number] DOB: March 30, 1941   Chief Complaint/ HPI  Tony [name],  79 y.o. , male presents for their Initial CCM visit with the clinical pharmacist via telephone.  PCP : [name], [name], MD Patient Care Team: [name], [name], MD as PCP - General (Family Medicine) Sanda Klein, MD as PCP - Cardiology (Cardiology) Wyatt Portela, MD as Consulting Physician (Oncology) Germaine Pomfret, [facility] as Pharmacist (Pharmacist)  Their chronic conditions include: Hypertension, Hyperlipidemia, Coronary Artery Disease, Anxiety and Prostate Cancer, Insomnia and Prediabetes   Office Visits: 11/14/19: Patient presented to Dr. [name] for follow-up.   Consult Visit: 04/03/20: Patient presented to Dr. [name] (oncology) for neoplasm follow-up.  12/21/19: Patient presented to Dr. [name] (oncology) for neoplasm follow-up.   Allergies  Allergen Reactions  . Penicillins Rash    Has patient had a PCN reaction causing immediate rash, facial/tongue/throat swelling, SOB or lightheadedness with hypotension: Yes Has patient had a PCN reaction causing severe rash involving mucus membranes or skin necrosis: No Has patient had a PCN reaction that required hospitalization: No Has patient had a PCN reaction occurring within the last 10 years: No If all of the above answers are "NO", then may proceed with Cephalosporin use.   Medications: Outpatient Encounter Medications as of 04/25/2020  Medication Sig  . abiraterone acetate (ZYTIGA) 250 MG tablet TAKE 4 TABLETS (1,000 MG TOTAL) BY MOUTH DAILY. TAKE ON AN EMPTY STOMACH 1 HOUR BEFORE OR 2 HOURS AFTER A MEAL  . acetaminophen (TYLENOL) 500 MG tablet Take 500 mg by mouth every 6 (six) hours as needed (for pain).  [name] albuterol (VENTOLIN HFA) 108 (90 Base) MCG/ACT inhaler Inhale 2 puffs into the lungs every 6 (six) hours as needed for wheezing or shortness of breath.  [name] amLODipine (NORVASC) 2.5  MG tablet Take 1 tablet (2.5 mg total) by mouth daily.  [name] aspirin EC 81 MG tablet Take 81 mg by mouth daily.  [name] atorvastatin (LIPITOR) 20 MG tablet TAKE 1 TABLET BY MOUTH EVERY DAY  . carbamide peroxide (DEBROX) 6.5 % OTIC solution Place 5 drops into both ears daily as needed (for earwax).  . citalopram (CELEXA) 10 MG tablet TAKE 1 TABLET BY MOUTH EVERY DAY  . diclofenac sodium (VOLTAREN) 1 % GEL Apply 4 g topically 4 (four) times daily.  . fluticasone (FLONASE) 50 MCG/ACT nasal spray PLACE 1 SPRAY INTO BOTH NOSTRILS DAILY AS NEEDED FOR ALLERGIES OR RHINITIS (AT BEDTIME).  . Fluticasone-Salmeterol (ADVAIR) 100-50 MCG/DOSE AEPB Inhale 1 puff into the lungs 2 (two) times daily.  . furosemide (LASIX) 20 MG tablet TAKE 1 TABLET BY MOUITH DAILY FOR 5 DAYS , THEN DAILY AS NEEDED FOR EDEMA  . LORazepam (ATIVAN) 1 MG tablet TAKE 1 TABLET (1 MG TOTAL) BY MOUTH 2 (TWO) TIMES DAILY AS NEEDED.  . metoprolol tartrate (LOPRESSOR) 25 MG tablet TAKE 1 TABLET BY MOUTH TWICE A DAY  . Multiple Vitamin (MULTIVITAMIN WITH MINERALS) TABS tablet Take 0.5 tablets by mouth every other day. Centrum  . omeprazole (PRILOSEC) 20 MG capsule Take 1 capsule (20 mg total) by mouth daily before breakfast.  . predniSONE (DELTASONE) 5 MG tablet TAKE 1 TABLET BY MOUTH EVERY DAY WITH BREAKFAST  . senna (SENOKOT) 8.6 MG TABS tablet Take 2 tablets by mouth daily as needed for mild constipation.  . polyethylene glycol powder (GLYCOLAX/MIRALAX) powder Take 17 g by mouth daily as needed for moderate constipation.   . potassium chloride  SA (KLOR-CON M20) 20 MEQ tablet Take 1 tablet (20 mEq total) by mouth 2 (two) times daily. (Patient not taking: Reported on 04/25/2020)  . [DISCONTINUED] abiraterone acetate (ZYTIGA) 250 MG tablet TAKE 4 TABLETS (1,000 MG TOTAL) BY MOUTH DAILY. TAKE ON AN EMPTY STOMACH 1 HOUR BEFORE OR 2 HOURS AFTER A MEAL   No facility-administered encounter medications on file as of 04/25/2020.     Current  Diagnosis/Assessment:  SDOH Interventions     Most Recent Value  SDOH Interventions  Financial Strain Interventions Other (Comment)  [Applied for LIS/ Extra Help Program]  Transportation Interventions Intervention Not Indicated      Goals Addressed            This Visit's Progress   . Chronic Care Management       CARE PLAN ENTRY (see longitudinal plan of care for additional care plan information)  Current Barriers:  . Chronic Disease Management support, education, and care coordination needs related to Hypertension, Hyperlipidemia, Coronary Artery Disease, Anxiety and Prostate Cancer, and Insomnia    Hypertension BP Readings from Last 3 Encounters:  04/03/20 (!) 137/97  12/21/19 (!) 141/84  11/14/19 130/80   . Pharmacist Clinical Goal(s): o Over the next 90 days, patient will work with PharmD and providers to achieve BP goal <140/90 . Current regimen:  . Amlodipine 2.5 mg daily  . Furosemide 20 mg daily PRN  . Metoprolol tartrate 25 mg twice daily . Interventions: o Discussed low salt diet and exercising as tolerated extensively . Patient self care activities - Over the next 90 days, patient will: o Check Blood Pressure weekly, document, and provide at future appointments o Ensure daily salt intake < 2300 mg/day  Hyperlipidemia Lab Results  Component Value Date/Time   LDLCALC 125 (H) 06/15/2018 02:22 AM   . Pharmacist Clinical Goal(s): o Over the next 90 days, patient will work with PharmD and providers to achieve LDL goal < 100 . Current regimen:  o Atorvastatin 20 mg daily . Interventions: o Discussed low cholesterol diet and exercising as tolerated extensively  Medication management . Pharmacist Clinical Goal(s): o Over the next 90 days, patient will work with PharmD and providers to achieve optimal medication adherence . [facility] . Interventions o Comprehensive medication review performed. o Continue current medication management  strategy . Patient self care activities - Over the next 90 days, patient will: o Take medications as prescribed o Report any questions or concerns to PharmD and/or provider(s)      Hypertension   BP goal is:  <140/90  Office blood pressures are  BP Readings from Last 3 Encounters:  04/03/20 (!) 137/97  12/21/19 (!) 141/84  11/14/19 130/80   CMP Latest Ref Rng & Units 04/03/2020 12/14/2019 09/19/2019  Glucose 70 - 99 mg/dL 115(H) 118(H) 111(H)  BUN 8 - 23 mg/dL '16 18 22  ' Creatinine 0.61 - 1.24 mg/dL 1.07 1.00 1.11  Sodium 135 - 145 mmol/L 142 138 142  Potassium 3.5 - 5.1 mmol/L 4.6 4.5 4.4  Chloride 98 - 111 mmol/L 104 105 106  CO2 22 - 32 mmol/L '27 22 25  ' Calcium 8.9 - 10.3 mg/dL 10.0 9.2 8.8(L)  Total Protein 6.5 - 8.1 g/dL 7.2 7.1 6.9  Total Bilirubin 0.3 - 1.2 mg/dL 1.1 0.7 0.8  Alkaline Phos 38 - 126 U/L 87 86 85  AST 15 - 41 U/L '19 17 19  ' ALT 0 - 44 U/L '17 14 15   ' Patient checks BP at home Never  Patient home BP readings are ranging: n/a  Patient has failed these meds in the past: n/a Patient is currently controlled on the following medications:  . Amlodipine 2.5 mg daily  . Furosemide 20 mg daily PRN  . Metoprolol tartrate 25 mg twice daily (AM, QHS)   We discussed diet and exercise extensively  Plan  Continue current medications   Hyperlipidemia   LDL goal < 70  Lipid Panel     Component Value Date/Time   CHOL 194 06/15/2018 0222   TRIG 82 06/15/2018 0222   HDL 53 06/15/2018 0222   LDLCALC 125 (H) 06/15/2018 0222    Hepatic Function Latest Ref Rng & Units 04/03/2020 12/14/2019 09/19/2019  Total Protein 6.5 - 8.1 g/dL 7.2 7.1 6.9  Albumin 3.5 - 5.0 g/dL 4.0 3.9 4.1  AST 15 - 41 U/L '19 17 19  ' ALT 0 - 44 U/L '17 14 15  ' Alk Phosphatase 38 - 126 U/L 87 86 85  Total Bilirubin 0.3 - 1.2 mg/dL 1.1 0.7 0.8  Bilirubin, Direct 0.1 - 0.5 mg/dL - - -     The 10-year ASCVD risk score Mikey Bussing DC Jr., et al., 2013) is: 34.3%   Values used to calculate the score:      Age: 10 years     Sex: Male     Is Non-Hispanic African American: No     Diabetic: No     Tobacco smoker: No     Systolic Blood Pressure: 706 mmHg     Is BP treated: Yes     HDL Cholesterol: 53 mg/dL     Total Cholesterol: 194 mg/dL   Patient has failed these meds in past: n/a Patient is currently uncontrolled on the following medications:  . Aspirin 81 mg daily  . Atorvastatin 20 mg daily (HS)   We discussed:  diet and exercise extensively  Plan  Recommend rechecking FLP  Emphysema Lung   Eosinophil count:   Lab Results  Component Value Date/Time   EOSPCT 3 04/03/2020 07:25 AM   EOSPCT 0.9 08/27/2017 09:15 AM  %                               Eos (Absolute):  Lab Results  Component Value Date/Time   EOSABS 0.2 04/03/2020 07:25 AM   EOSABS 0.1 08/27/2017 09:15 AM    Tobacco Status:  Social History   Tobacco Use  Smoking Status Former Smoker  . Years: 60.00  . Types: Cigars  . Quit date: 09/14/2012  . Years since quitting: 7.6  Smokeless Tobacco Never Used  Tobacco Comment   little cigars; the small ones"    Patient has failed these meds in past: n/a Patient is currently controlled on the following medications:  . Albuterol 108 mcg/act 2 puff q6hr PRN  . Advair 100-50 mcg/dose 1 puff BID (once daily)   Using maintenance inhaler regularly? Yes Frequency of rescue inhaler use:  1-2x per week  We discussed:  proper inhaler technique  Denies shortness of breath, but does get tired quickly when walking. Patient's wife states that the patient has trouble using the Advair diskus. We reviewed inhaler technique, but patient was interested in switching to Advair HFA since he knows how to use that well. Also requests refill on ProAir.  Plan  Continue current medications  Will persue PAP for inhalers to help with drug costs.    Pre-Diabetes   A1c goal <6.5%  Recent  Relevant Labs: Lab Results  Component Value Date/Time   HGBA1C 6.2 02/13/2019 10:54 AM   GFR  78.21 02/18/2018 09:01 AM   GFR 93.27 01/21/2018 10:12 AM    Last diabetic Eye exam: No results found for: HMDIABEYEEXA  Last diabetic Foot exam: No results found for: HMDIABFOOTEX   Checking BG: Never  Patient has failed these meds in past: n/a Patient is currently controlled on the following medications: . None  We discussed: diet and exercise extensively  Plan  Continue control with diet and exercise  Depression    PHQ9 Score:  PHQ9 SCORE ONLY 03/13/2019 12/02/2018 11/30/2017  PHQ-9 Total Score 0 0 0   GAD7 Score: No flowsheet data found.  Patient has failed these meds in past: n/a Patient is currently controlled on the following medications:  . Citalopram 10 mg daily  . Lorazepam 1 mg BID PRN (twice daily consistently)   We discussed:  Discussed long-term risks of lorazepam, patient unwilling to decrease dose or try to cut back on medicine.   Plan  Continue current medications  Prostate Cancer   Dr. [name]  Patient has failed these meds in past: n/a Patient is currently controlled on the following medications:  . Abiraterone (Zytiga) 250 mg 4 tablets daily (AM)  . Prednisone 5 mg daily (AM)   We discussed: Sees Dr. [name] every 3 months. Gets medication through PAP    Plan  Continue current medications  GERD   Patient has failed these meds in past: n/a Patient is currently controlled on the following medications:  . Omeprazole 20 mg daily before breakfast   We discussed:  n/a  Plan  Continue current medications  Misc / OTC   . Acetaminophen 500 mg q6hr PRN  . Carbamide peroxide 6.5% otic solution . Flonase Nasal Spray 50 mcg/act 1 spray daily PRN  . Multivitamin 1/2 tablet daily (Centrum)  . Miralax 17g daily PRN (rarely uses)  . Potassium Chloride SA 20 mEq BID (stopped in July) . Senna 8.6 mg 2 tablets daily PRN   Plan  Recommend BMP to re-assess potassium levels at next office visit.   Medication Management   Patient estimates  monthly income of $2,500. Will apply for Extra Help Program  [facility] Uses pill box? Yes   Plan  Continue current medication management strategy  Follow up:  1 week CMA call to check on LIS application status and to apply for PAP if necessary.  6 month phone visit  [facility]

## 2020-04-23 ENCOUNTER — Other Ambulatory Visit: Payer: Self-pay | Admitting: Oncology

## 2020-04-23 DIAGNOSIS — C61 Malignant neoplasm of prostate: Secondary | ICD-10-CM

## 2020-04-24 ENCOUNTER — Telehealth: Payer: Self-pay

## 2020-04-24 NOTE — Progress Notes (Signed)
Chronic Care Management Pharmacy Assistant   Name: Tony [name]  MRN: [id number] DOB: [birth year]  Reason for Encounter: Medication Review  PCP : [name], [name], MD  Allergies:   Allergies  Allergen Reactions   Penicillins Rash    Has patient had a PCN reaction causing immediate rash, facial/tongue/throat swelling, SOB or lightheadedness with hypotension: Yes Has patient had a PCN reaction causing severe rash involving mucus membranes or skin necrosis: No Has patient had a PCN reaction that required hospitalization: No Has patient had a PCN reaction occurring within the last 10 years: No If all of the above answers are "NO", then may proceed with Cephalosporin use.    Medications: Outpatient Encounter Medications as of 04/24/2020  Medication Sig   abiraterone acetate (ZYTIGA) 250 MG tablet TAKE 4 TABLETS (1,000 MG TOTAL) BY MOUTH DAILY. TAKE ON AN EMPTY STOMACH 1 HOUR BEFORE OR 2 HOURS AFTER A MEAL   acetaminophen (TYLENOL) 500 MG tablet Take 500 mg by mouth every 6 (six) hours as needed (for pain).   albuterol (VENTOLIN HFA) 108 (90 Base) MCG/ACT inhaler Inhale 2 puffs into the lungs every 6 (six) hours as needed for wheezing or shortness of breath.   amLODipine (NORVASC) 2.5 MG tablet Take 1 tablet (2.5 mg total) by mouth daily.   aspirin EC 81 MG tablet Take 81 mg by mouth daily.   atorvastatin (LIPITOR) 20 MG tablet TAKE 1 TABLET BY MOUTH EVERY DAY   carbamide peroxide (DEBROX) 6.5 % OTIC solution Place 5 drops into both ears daily as needed (for earwax).   citalopram (CELEXA) 10 MG tablet TAKE 1 TABLET BY MOUTH EVERY DAY   diclofenac sodium (VOLTAREN) 1 % GEL Apply 4 g topically 4 (four) times daily.   fluticasone (FLONASE) 50 MCG/ACT nasal spray PLACE 1 SPRAY INTO BOTH NOSTRILS DAILY AS NEEDED FOR ALLERGIES OR RHINITIS (AT BEDTIME).   Fluticasone-Salmeterol (ADVAIR) 100-50 MCG/DOSE AEPB Inhale 1 puff into the lungs 2 (two) times daily.   furosemide  (LASIX) 20 MG tablet TAKE 1 TABLET BY MOUITH DAILY FOR 5 DAYS , THEN DAILY AS NEEDED FOR EDEMA   LORazepam (ATIVAN) 1 MG tablet TAKE 1 TABLET (1 MG TOTAL) BY MOUTH 2 (TWO) TIMES DAILY AS NEEDED.   metoprolol tartrate (LOPRESSOR) 25 MG tablet TAKE 1 TABLET BY MOUTH TWICE A DAY   Multiple Vitamin (MULTIVITAMIN WITH MINERALS) TABS tablet Take 0.5 tablets by mouth every other day. Centrum   omeprazole (PRILOSEC) 20 MG capsule Take 1 capsule (20 mg total) by mouth daily before breakfast.   polyethylene glycol powder (GLYCOLAX/MIRALAX) powder Take 17 g by mouth daily as needed (constipation.).   potassium chloride SA (KLOR-CON M20) 20 MEQ tablet Take 1 tablet (20 mEq total) by mouth 2 (two) times daily.   predniSONE (DELTASONE) 5 MG tablet TAKE 1 TABLET BY MOUTH EVERY DAY WITH BREAKFAST   No facility-administered encounter medications on file as of 04/24/2020.    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  Emphysema lung ([place]) 02/03/2018   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   Acute appendicitis 01/21/2018   Perforated appendicitis 01/21/2018   Chronic fatigue 06/01/2017   Essential hypertension, benign 07/09/2016   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   Yes, 04/03/20 Shadad,Firas- oncology   Any changes in your medications or health? NO  Any side effects from any medications? No  Do you have an symptoms or problems not managed by your medications? No  Any concerns about your health right now?    Patient wife stated "yes,  His cancer but it under control by his Oncology"  Has your provider asked that you check blood pressure, blood sugar, or follow special diet at home?   Patient wife stated , "he loves home cook food, but Im unable to cook right now due to having neck surgery , but my daughter came and she is  helping out " .  Home cook food - Vegetables , stir fry , water.   Patient wife states patient use very little of salt intake .  Cup of coffee every morning   Do you get any type of exercise on a regular basis?   Patient wife stated , patient walks to the mail box and around the house and stumble here and there , patient almost fell out of the door last week .Patient has a cane he use.  Can you think of a goal you would like to reach for your health? None ID  Do you have any problems getting your medications? Yes, Patient wife stated he has a hard time affording his inhaler Fluticasone-Salmeterol ADVAIR ,  he does not use it as instructed .   Is there anything that you would like to discuss during the appointment? Patient wife stated they can barely afford his medication  due to them being on low income.  Please bring medications and supplements to appointment  [place] Pharmacist Assistant [phone]

## 2020-04-25 ENCOUNTER — Ambulatory Visit: Payer: Medicare Other

## 2020-04-25 DIAGNOSIS — E7800 Pure hypercholesterolemia, unspecified: Secondary | ICD-10-CM

## 2020-04-25 DIAGNOSIS — I10 Essential (primary) hypertension: Secondary | ICD-10-CM

## 2020-04-26 NOTE — Patient Instructions (Addendum)
Visit Information It was great speaking with you today!  Please let me know if you have any questions about our visit.  Goals Addressed            This Visit's Progress   . Chronic Care Management       CARE PLAN ENTRY (see longitudinal plan of care for additional care plan information)  Current Barriers:  . Chronic Disease Management support, education, and care coordination needs related to Hypertension, Hyperlipidemia, Coronary Artery Disease, Anxiety and Prostate Cancer, and Insomnia    Hypertension BP Readings from Last 3 Encounters:  04/03/20 (!) 137/97  12/21/19 (!) 141/84  11/14/19 130/80   . Pharmacist Clinical Goal(s): o Over the next 90 days, patient will work with PharmD and providers to achieve BP goal <140/90 . Current regimen:  . Amlodipine 2.5 mg daily  . Furosemide 20 mg daily PRN  . Metoprolol tartrate 25 mg twice daily . Interventions: o Discussed low salt diet and exercising as tolerated extensively . Patient self care activities - Over the next 90 days, patient will: o Check Blood Pressure weekly, document, and provide at future appointments o Ensure daily salt intake < 2300 mg/day  Hyperlipidemia Lab Results  Component Value Date/Time   LDLCALC 125 (H) 06/15/2018 02:22 AM   . Pharmacist Clinical Goal(s): o Over the next 90 days, patient will work with PharmD and providers to achieve LDL goal < 100 . Current regimen:  o Atorvastatin 20 mg daily . Interventions: o Discussed low cholesterol diet and exercising as tolerated extensively  Medication management . Pharmacist Clinical Goal(s): o Over the next 90 days, patient will work with PharmD and providers to achieve optimal medication adherence . Current pharmacy: [facility] . Interventions o Comprehensive medication review performed. o Continue current medication management strategy . Patient self care activities - Over the next 90 days, patient will: o Take medications as prescribed o Report any  questions or concerns to PharmD and/or provider(s)       Tony [name] was given information about Chronic Care Management services today including:  1. CCM service includes personalized support from designated clinical staff supervised by his physician, including individualized plan of care and coordination with other care providers 2. 24/7 contact phone numbers for assistance for urgent and routine care needs. 3. Standard insurance, coinsurance, copays and deductibles apply for chronic care management only during months in which we provide at least 20 minutes of these services. Most insurances cover these services at 100%, however patients may be responsible for any copay, coinsurance and/or deductible if applicable. This service may help you avoid the need for more expensive face-to-face services. 4. Only one practitioner may furnish and bill the service in a calendar month. 5. The patient may stop CCM services at any time (effective at the end of the month) by phone call to the office staff.  Patient agreed to services and verbal consent obtained.   The patient verbalized understanding of instructions provided today and agreed to receive a mailed copy of patient instruction and/or educational materials. Telephone follow up appointment with pharmacy team member scheduled for: 10/26/19 at 9:00 AM  [facility] [place]   [place] stands for "Dietary Approaches to Stop Hypertension." The DASH eating plan is a healthy eating plan that has been shown to reduce high blood pressure (hypertension). It may also reduce your risk for type 2 diabetes, heart disease, and stroke. The DASH eating plan may also  help with weight loss. What are tips for following this plan?  General guidelines  Avoid eating more than 2,300 mg (milligrams) of salt (sodium) a day. If you have hypertension, you may need to reduce your sodium  intake to 1,500 mg a day.  Limit alcohol intake to no more than 1 drink a day for nonpregnant women and 2 drinks a day for men. One drink equals 12 oz of beer, 5 oz of wine, or 1 oz of hard liquor.  Work with your health care provider to maintain a healthy body weight or to lose weight. Ask what an ideal weight is for you.  Get at least 30 minutes of exercise that causes your heart to beat faster (aerobic exercise) most days of the week. Activities may include walking, swimming, or biking.  Work with your health care provider or diet and nutrition specialist (dietitian) to adjust your eating plan to your individual calorie needs. Reading food labels   Check food labels for the amount of sodium per serving. Choose foods with less than 5 percent of the Daily Value of sodium. Generally, foods with less than 300 mg of sodium per serving fit into this eating plan.  To find whole grains, look for the word "whole" as the first word in the ingredient list. Shopping  Buy products labeled as "low-sodium" or "no salt added."  Buy fresh foods. Avoid canned foods and premade or frozen meals. Cooking  Avoid adding salt when cooking. Use salt-free seasonings or herbs instead of table salt or sea salt. Check with your health care provider or pharmacist before using salt substitutes.  Do not fry foods. Cook foods using healthy methods such as baking, boiling, grilling, and broiling instead.  Cook with heart-healthy oils, such as olive, canola, soybean, or sunflower oil. Meal planning  Eat a balanced diet that includes: ? 5 or more servings of fruits and vegetables each day. At each meal, try to fill half of your plate with fruits and vegetables. ? Up to 6-8 servings of whole grains each day. ? Less than 6 oz of lean meat, poultry, or fish each day. A 3-oz serving of meat is about the same size as a deck of cards. One egg equals 1 oz. ? 2 servings of low-fat dairy each day. ? A serving of nuts,  seeds, or beans 5 times each week. ? Heart-healthy fats. Healthy fats called Omega-3 fatty acids are found in foods such as flaxseeds and coldwater fish, like sardines, salmon, and mackerel.  Limit how much you eat of the following: ? Canned or prepackaged foods. ? Food that is high in trans fat, such as fried foods. ? Food that is high in saturated fat, such as fatty meat. ? Sweets, desserts, sugary drinks, and other foods with added sugar. ? Full-fat dairy products.  Do not salt foods before eating.  Try to eat at least 2 vegetarian meals each week.  Eat more home-cooked food and less restaurant, buffet, and fast food.  When eating at a restaurant, ask that your food be prepared with less salt or no salt, if possible. What foods are recommended? The items listed may not be a complete list. Talk with your dietitian about what dietary choices are best for you. Grains Whole-grain or whole-wheat bread. Whole-grain or whole-wheat pasta. Brown rice. [place]. Bulgur. Whole-grain and low-sodium cereals. Pita bread. Low-fat, low-sodium crackers. Whole-wheat flour tortillas. Vegetables Fresh or frozen vegetables (raw, steamed, roasted, or grilled). Low-sodium or reduced-sodium tomato and vegetable  juice. Low-sodium or reduced-sodium tomato sauce and tomato paste. Low-sodium or reduced-sodium canned vegetables. Fruits All fresh, dried, or frozen fruit. Canned fruit in natural juice (without added sugar). Meat and other protein foods Skinless chicken or [place]. Ground chicken or [place]. Pork with fat trimmed off. Fish and seafood. Egg whites. Dried beans, peas, or lentils. Unsalted nuts, nut butters, and seeds. Unsalted canned beans. Lean cuts of beef with fat trimmed off. Low-sodium, lean deli meat. Dairy Low-fat (1%) or fat-free (skim) milk. Fat-free, low-fat, or reduced-fat cheeses. Nonfat, low-sodium ricotta or cottage cheese. Low-fat or nonfat yogurt. Low-fat, low-sodium cheese. Fats  and oils Soft margarine without trans fats. Vegetable oil. Low-fat, reduced-fat, or light mayonnaise and salad dressings (reduced-sodium). Canola, safflower, olive, soybean, and sunflower oils. Avocado. Seasoning and other foods Herbs. Spices. Seasoning mixes without salt. Unsalted popcorn and pretzels. Fat-free sweets. What foods are not recommended? The items listed may not be a complete list. Talk with your dietitian about what dietary choices are best for you. Grains Baked goods made with fat, such as croissants, muffins, or some breads. Dry pasta or rice meal packs. Vegetables Creamed or fried vegetables. Vegetables in a cheese sauce. Regular canned vegetables (not low-sodium or reduced-sodium). Regular canned tomato sauce and paste (not low-sodium or reduced-sodium). Regular tomato and vegetable juice (not low-sodium or reduced-sodium). Angie Fava. Olives. Fruits Canned fruit in a light or heavy syrup. Fried fruit. Fruit in cream or butter sauce. Meat and other protein foods Fatty cuts of meat. Ribs. Fried meat. Berniece Salines. Sausage. Bologna and other processed lunch meats. Salami. Fatback. Hotdogs. Bratwurst. Salted nuts and seeds. Canned beans with added salt. Canned or smoked fish. Whole eggs or egg yolks. Chicken or [place] with skin. Dairy Whole or 2% milk, cream, and half-and-half. Whole or full-fat cream cheese. Whole-fat or sweetened yogurt. Full-fat cheese. Nondairy creamers. Whipped toppings. Processed cheese and cheese spreads. Fats and oils Butter. Stick margarine. Lard. Shortening. Ghee. Bacon fat. Tropical oils, such as coconut, palm kernel, or palm oil. Seasoning and other foods Salted popcorn and pretzels. Onion salt, garlic salt, seasoned salt, table salt, and sea salt. Worcestershire sauce. Tartar sauce. Barbecue sauce. Teriyaki sauce. Soy sauce, including reduced-sodium. Steak sauce. Canned and packaged gravies. Fish sauce. Oyster sauce. Cocktail sauce. Horseradish that you find on  the shelf. Ketchup. Mustard. Meat flavorings and tenderizers. Bouillon cubes. Hot sauce and Tabasco sauce. Premade or packaged marinades. Premade or packaged taco seasonings. Relishes. Regular salad dressings. Where to find more information:  National Heart, Lung, and [place]: https://wilson-eaton.com/  American Heart Association: www.heart.org Summary  The DASH eating plan is a healthy eating plan that has been shown to reduce high blood pressure (hypertension). It may also reduce your risk for type 2 diabetes, heart disease, and stroke.  With the DASH eating plan, you should limit salt (sodium) intake to 2,300 mg a day. If you have hypertension, you may need to reduce your sodium intake to 1,500 mg a day.  When on the DASH eating plan, aim to eat more fresh fruits and vegetables, whole grains, lean proteins, low-fat dairy, and heart-healthy fats.  Work with your health care provider or diet and nutrition specialist (dietitian) to adjust your eating plan to your individual calorie needs. This information is not intended to replace advice given to you by your health care provider. Make sure you discuss any questions you have with your health care provider. Document Revised: 08/13/2017 Document Reviewed: 08/24/2016 Elsevier Patient Education  2020 [employer].

## 2020-04-29 MED FILL — ABIRATERONE ACETATE 250 MG: 250 | 30 days supply | Qty: 120 | Fill #0

## 2020-05-07 ENCOUNTER — Telehealth: Payer: Self-pay

## 2020-05-07 NOTE — Progress Notes (Signed)
Chronic Care Management Pharmacy Assistant   Name: Tony [name]  MRN: [id number] DOB: [birth year]  Reason for Encounter: Medication Review /Patient assistance for ProAir HFA and Advair HFA    PCP : [name], [name], MD  Allergies:   Allergies  Allergen Reactions  . Penicillins Rash    Has patient had a PCN reaction causing immediate rash, facial/tongue/throat swelling, SOB or lightheadedness with hypotension: Yes Has patient had a PCN reaction causing severe rash involving mucus membranes or skin necrosis: No Has patient had a PCN reaction that required hospitalization: No Has patient had a PCN reaction occurring within the last 10 years: No If all of the above answers are "NO", then may proceed with Cephalosporin use.    Medications: Outpatient Encounter Medications as of 05/07/2020  Medication Sig  . abiraterone acetate (ZYTIGA) 250 MG tablet TAKE 4 TABLETS (1,000 MG TOTAL) BY MOUTH DAILY. TAKE ON AN EMPTY STOMACH 1 HOUR BEFORE OR 2 HOURS AFTER A MEAL  . acetaminophen (TYLENOL) 500 MG tablet Take 500 mg by mouth every 6 (six) hours as needed (for pain).  [name] albuterol (VENTOLIN HFA) 108 (90 Base) MCG/ACT inhaler Inhale 2 puffs into the lungs every 6 (six) hours as needed for wheezing or shortness of breath.  [name] amLODipine (NORVASC) 2.5 MG tablet Take 1 tablet (2.5 mg total) by mouth daily.  [name] aspirin EC 81 MG tablet Take 81 mg by mouth daily.  [name] atorvastatin (LIPITOR) 20 MG tablet TAKE 1 TABLET BY MOUTH EVERY DAY  . carbamide peroxide (DEBROX) 6.5 % OTIC solution Place 5 drops into both ears daily as needed (for earwax).  . citalopram (CELEXA) 10 MG tablet TAKE 1 TABLET BY MOUTH EVERY DAY  . diclofenac sodium (VOLTAREN) 1 % GEL Apply 4 g topically 4 (four) times daily.  . fluticasone (FLONASE) 50 MCG/ACT nasal spray PLACE 1 SPRAY INTO BOTH NOSTRILS DAILY AS NEEDED FOR ALLERGIES OR RHINITIS (AT BEDTIME).  . Fluticasone-Salmeterol (ADVAIR) 100-50 MCG/DOSE AEPB Inhale 1 puff  into the lungs 2 (two) times daily.  . furosemide (LASIX) 20 MG tablet TAKE 1 TABLET BY MOUITH DAILY FOR 5 DAYS , THEN DAILY AS NEEDED FOR EDEMA  . LORazepam (ATIVAN) 1 MG tablet TAKE 1 TABLET (1 MG TOTAL) BY MOUTH 2 (TWO) TIMES DAILY AS NEEDED.  . metoprolol tartrate (LOPRESSOR) 25 MG tablet TAKE 1 TABLET BY MOUTH TWICE A DAY  . Multiple Vitamin (MULTIVITAMIN WITH MINERALS) TABS tablet Take 0.5 tablets by mouth every other day. Centrum  . omeprazole (PRILOSEC) 20 MG capsule Take 1 capsule (20 mg total) by mouth daily before breakfast.  . polyethylene glycol powder (GLYCOLAX/MIRALAX) powder Take 17 g by mouth daily as needed for moderate constipation.   . potassium chloride SA (KLOR-CON M20) 20 MEQ tablet Take 1 tablet (20 mEq total) by mouth 2 (two) times daily. (Patient not taking: Reported on 04/25/2020)  . predniSONE (DELTASONE) 5 MG tablet TAKE 1 TABLET BY MOUTH EVERY DAY WITH BREAKFAST  . senna (SENOKOT) 8.6 MG TABS tablet Take 2 tablets by mouth daily as needed for mild constipation.   No facility-administered encounter medications on file as of 05/07/2020.    Current Diagnosis: Patient Active Problem List   Diagnosis Date Noted  . Morbid obesity ([place]) 11/14/2019  . Prediabetes 11/14/2019  . Varicose veins of both lower extremities 05/17/2019  . Bilateral lower extremity edema 03/13/2019  . Hypokalemia 03/13/2019  . 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 SSS (sick sinus syndrome) ([place]) 06/14/2018  . 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 Coronary artery disease involving native coronary artery of native heart without angina pectoris 06/08/2018  . Sick sinus syndrome ([place]) 06/08/2018  . 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 Emphysema lung ([place]) 02/03/2018  . 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 Acute appendicitis 01/21/2018  . Perforated appendicitis 01/21/2018  . Chronic fatigue 06/01/2017  . Essential hypertension, benign 07/09/2016  . 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 Coronary artery calcification seen on CAT scan 05/15/2016  . Anxiety disorder 04/08/2016  . Insomnia 04/08/2016  . 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 Tobacco use disorder 04/08/2016  . Hypercholesterolemia 04/08/2016  . Malignant neoplasm of prostate ([place]) 02/23/2014      Follow-Up:  Patient Assistance Coordination   Patient wife states the patient was denied for the extra help program.  Completing  patient assistance application for ProAir HFA and Advair HFA from [employer] and [employer] awaiting on pharmacist review of patient document   Spoke to patient to inform him that we are sending her a Patient assistance form  for Proair HFA  and Advair HFA by mail.Informed patient to include a copy of his proof of income AND a copy of hi Explanation of Benefits (EOB) statement from her insurance.Advised patient to return the PAP forms back to the [place] office.  [facility] Pharmacist Assistant [phone]

## 2020-05-15 ENCOUNTER — Other Ambulatory Visit: Payer: Self-pay | Admitting: Family Medicine

## 2020-05-15 DIAGNOSIS — F418 Other specified anxiety disorders: Secondary | ICD-10-CM

## 2020-05-15 NOTE — Telephone Encounter (Signed)
Rx was last filled on 05/03/20, next refill on 06/03/20. Tony Wenk [name], MD

## 2020-05-21 ENCOUNTER — Ambulatory Visit (INDEPENDENT_AMBULATORY_CARE_PROVIDER_SITE_OTHER): Payer: Medicare Other | Admitting: Family Medicine

## 2020-05-21 ENCOUNTER — Other Ambulatory Visit: Payer: Self-pay

## 2020-05-21 ENCOUNTER — Encounter: Payer: Self-pay | Admitting: Family Medicine

## 2020-05-21 VITALS — BP 128/70 | HR 81 | Temp 97.8°F | Resp 16 | Ht 69.0 in | Wt 228.0 lb

## 2020-05-21 DIAGNOSIS — Z23 Encounter for immunization: Secondary | ICD-10-CM | POA: Diagnosis not present

## 2020-05-21 DIAGNOSIS — F418 Other specified anxiety disorders: Secondary | ICD-10-CM | POA: Diagnosis not present

## 2020-05-21 DIAGNOSIS — Z1159 Encounter for screening for other viral diseases: Secondary | ICD-10-CM

## 2020-05-21 DIAGNOSIS — E7800 Pure hypercholesterolemia, unspecified: Secondary | ICD-10-CM

## 2020-05-21 DIAGNOSIS — R233 Spontaneous ecchymoses: Secondary | ICD-10-CM

## 2020-05-21 DIAGNOSIS — R7303 Prediabetes: Secondary | ICD-10-CM

## 2020-05-21 DIAGNOSIS — I10 Essential (primary) hypertension: Secondary | ICD-10-CM

## 2020-05-21 LAB — POCT GLYCOSYLATED HEMOGLOBIN (HGB A1C): Hemoglobin A1C: 5.6 % (ref 4.0–5.6)

## 2020-05-21 NOTE — Assessment & Plan Note (Addendum)
Stable. No changes in Lorazepam or Celexa. He understands side effects.

## 2020-05-21 NOTE — Assessment & Plan Note (Addendum)
Continue Atorvastatin 20 mg daily, will adjust dose if needed. Low fat diet.

## 2020-05-21 NOTE — Progress Notes (Signed)
HPI: [name] is a 79 y.o. male, who is here today with his wife for chronic disease management.  He was last seen on 11/14/19. Since his last visit he has seen his oncologist.  Prediabetes: HgA1C was 6.2 in 02/2019. He is on chronic prednisone treatment, 5 mg daily. Negative for polydipsia,polyuria, or polyphagia.  Hypertension: He is on amlodipine 2.5 mg daily. He takes furosemide to help with LE edema. Negative for orthopnea or PND. He has not noted unusual/frequent headache, visual changes, CP, dyspnea, palpitations, or focal neurologic deficit.  Lab Results  Component Value Date   CREATININE 1.07 04/03/2020   BUN 16 04/03/2020   NA 142 04/03/2020   K 4.6 04/03/2020   CL 104 04/03/2020   CO2 27 04/03/2020   HLD: He is on atorvastatin 20 mg daily. + CAD. Tolerating medication well.  Lab Results  Component Value Date   CHOL 194 06/15/2018   HDL 53 06/15/2018   LDLCALC 125 (H) 06/15/2018   TRIG 82 06/15/2018   CHOLHDL 3.7 06/15/2018   Anxiety: Currently he is on Celexa 10 mg daily and lorazepam 1 mg twice daily. Lorazepam also helps him with his sleep. Medication is still helping, he has not noticed side effects.  C/O easy bruising, mainly on UE's. Lesions are not tender. He has not noted frequent nose/gum bleeding, gross hematuria, or blood in the stool.  Review of Systems  Constitutional: Positive for fatigue. Negative for activity change, appetite change and fever.  HENT: Negative for mouth sores and sore throat.   Respiratory: Negative for cough and wheezing.   Gastrointestinal: Negative for abdominal pain, nausea and vomiting.  Genitourinary: Negative for decreased urine volume and dysuria.  Skin: Negative for wound.  Neurological: Negative for syncope and facial asymmetry.  Psychiatric/Behavioral: Negative for confusion and hallucinations.  Rest of ROS, see pertinent positives sand negatives in HPI  Current Outpatient Medications on  File Prior to Visit  Medication Sig Dispense Refill  . abiraterone acetate (ZYTIGA) 250 MG tablet TAKE 4 TABLETS (1,000 MG TOTAL) BY MOUTH DAILY. TAKE ON AN EMPTY STOMACH 1 HOUR BEFORE OR 2 HOURS AFTER A MEAL 120 tablet 2  . acetaminophen (TYLENOL) 500 MG tablet Take 500 mg by mouth every 6 (six) hours as needed (for pain).    [name] albuterol (VENTOLIN HFA) 108 (90 Base) MCG/ACT inhaler Inhale 2 puffs into the lungs every 6 (six) hours as needed for wheezing or shortness of breath. 8.5 g 2  . amLODipine (NORVASC) 2.5 MG tablet Take 1 tablet (2.5 mg total) by mouth daily. 90 tablet 2  . aspirin EC 81 MG tablet Take 81 mg by mouth daily.    [name] atorvastatin (LIPITOR) 20 MG tablet TAKE 1 TABLET BY MOUTH EVERY DAY 90 tablet 2  . carbamide peroxide (DEBROX) 6.5 % OTIC solution Place 5 drops into both ears daily as needed (for earwax).    . citalopram (CELEXA) 10 MG tablet TAKE 1 TABLET BY MOUTH EVERY DAY 90 tablet 3  . diclofenac sodium (VOLTAREN) 1 % GEL Apply 4 g topically 4 (four) times daily. 4 Tube 3  . fluticasone (FLONASE) 50 MCG/ACT nasal spray PLACE 1 SPRAY INTO BOTH NOSTRILS DAILY AS NEEDED FOR ALLERGIES OR RHINITIS (AT BEDTIME). 48 mL 1  . Fluticasone-Salmeterol (ADVAIR) 100-50 MCG/DOSE AEPB Inhale 1 puff into the lungs 2 (two) times daily. 1 each 3  . furosemide (LASIX) 20 MG tablet TAKE 1 TABLET BY MOUITH DAILY FOR 5 DAYS , THEN  DAILY AS NEEDED FOR EDEMA 90 tablet 1  . metoprolol tartrate (LOPRESSOR) 25 MG tablet TAKE 1 TABLET BY MOUTH TWICE A DAY 180 tablet 2  . Multiple Vitamin (MULTIVITAMIN WITH MINERALS) TABS tablet Take 0.5 tablets by mouth every other day. Centrum    . omeprazole (PRILOSEC) 20 MG capsule Take 1 capsule (20 mg total) by mouth daily before breakfast. 90 capsule 3  . polyethylene glycol powder (GLYCOLAX/MIRALAX) powder Take 17 g by mouth daily as needed for moderate constipation.     . potassium chloride SA (KLOR-CON M20) 20 MEQ tablet Take 1 tablet (20 mEq total) by mouth 2  (two) times daily. 180 tablet 0  . predniSONE (DELTASONE) 5 MG tablet TAKE 1 TABLET BY MOUTH EVERY DAY WITH BREAKFAST 90 tablet 0  . senna (SENOKOT) 8.6 MG TABS tablet Take 2 tablets by mouth daily as needed for mild constipation.    [name] LORazepam (ATIVAN) 1 MG tablet TAKE 1 TABLET (1 MG TOTAL) BY MOUTH 2 (TWO) TIMES DAILY AS NEEDED. 60 tablet 3   No current facility-administered medications on file prior to visit.     Past Medical History:  Diagnosis Date  . Gastric outlet obstruction 02/03/2018   Tony [name] 02/03/2018  . GERD (gastroesophageal reflux disease)   . History of hiatal hernia    small/notes 02/03/2018  . Hypertension    no meds  . Presence of permanent cardiac pacemaker   . 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 Radiation   . Sinus problem   . SSS (sick sinus syndrome) ([facility])    Allergies  Allergen Reactions  . Penicillins Rash    Has patient had a PCN reaction causing immediate rash, facial/tongue/throat swelling, SOB or lightheadedness with hypotension: Yes Has patient had a PCN reaction causing severe rash involving mucus membranes or skin necrosis: No Has patient had a PCN reaction that required hospitalization: No Has patient had a PCN reaction occurring within the last 10 years: No If all of the above answers are "NO", then may proceed with Cephalosporin use.    Social History   Socioeconomic History  . Marital status: Married    Spouse name: Not on file  . Number of children: 3  . Years of education: Not on file  . Highest education level: Not on file  Occupational History  . 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 Smoking status: Former Smoker    Years: 60.00    Types: Cigars    Quit date: 09/14/2012    Years since quitting: 7.6  . Smokeless tobacco: Never Used  . Tobacco comment: little cigars; the small ones"  Vaping Use  . Vaping  Use: Never used  Substance and Sexual Activity  . Alcohol use: Yes    Alcohol/week: 14.0 standard drinks    Types: 14 Cans of beer per week  . Drug use: No  . Sexual activity: Not Currently  Other Topics Concern  . Not on file  Social History Narrative   Lives with wife and 2 dogs in one level home; daughter and her family co-habitate.   Has three daughters, all in [place], supportive. Five grandchildren.   Wants to return to doing silver sneakers once gym re-opens.         Social Determinants of Health   Financial Resource Strain: Medium Risk  . Difficulty of Paying  Living Expenses: Somewhat hard  Food Insecurity:   . Worried About Charity fundraiser in the Last Year: Not on file  . Ran Out of Food in the Last Year: Not on file  Transportation Needs: No Transportation Needs  . Lack of Transportation (Medical): No  . Lack of Transportation (Non-Medical): No  Physical Activity:   . Days of Exercise per Week: Not on file  . Minutes of Exercise per Session: Not on file  Stress:   . Feeling of Stress : Not on file  Social Connections:   . Frequency of Communication with Friends and Family: Not on file  . Frequency of Social Gatherings with Friends and Family: Not on file  . Attends Religious Services: Not on file  . Active Member of Clubs or Organizations: Not on file  . Attends Archivist Meetings: Not on file  . Marital Status: Not on file   Vitals:   05/21/20 0908  BP: 128/70  Pulse: 81  Resp: 16  Temp: 97.8 F (36.6 C)  SpO2: 91%   Body mass index is 33.67 kg/m.  Physical Exam Vitals and nursing note reviewed.  Constitutional:      General: He is not in acute distress.    Appearance: He is well-developed.  HENT:     Head: Normocephalic and atraumatic.     Mouth/Throat:     Mouth: Mucous membranes are moist.     Pharynx: Oropharynx is clear.  Eyes:     Conjunctiva/sclera: Conjunctivae normal.  Cardiovascular:     Rate and Rhythm: Normal rate and  regular rhythm.     Heart sounds: No murmur heard.      Comments: Trace pitting LE edema, bilateral. DP pulses present. Pulmonary:     Effort: Pulmonary effort is normal. No respiratory distress.     Breath sounds: Normal breath sounds.  Abdominal:     Palpations: Abdomen is soft. There is no hepatomegaly or mass.     Tenderness: There is no abdominal tenderness.  Lymphadenopathy:     Cervical: No cervical adenopathy.  Skin:    General: Skin is warm.     Findings: Ecchymosis (Scattered on UE's, mainly dorsal aspect of forearms. No tenderness.) present. No erythema or rash.  Neurological:     General: No focal deficit present.     Mental Status: He is alert and oriented to person, place, and time.     Cranial Nerves: No cranial nerve deficit.     Gait: Gait normal.  Psychiatric:     Comments: Well groomed, good eye contact.   ASSESSMENT AND PLAN:  [name] was seen today for chronic disease management.  Orders Placed This Encounter  Procedures  . Flu Vaccine QUAD High Dose(Fluad)  . Lipid panel  . Hepatitis C antibody  . POC HgB A1c   Lab Results  Component Value Date   CHOL 139 05/21/2020   HDL 51 05/21/2020   LDLCALC 71 05/21/2020   TRIG 89 05/21/2020   CHOLHDL 2.7 05/21/2020   Lab Results  Component Value Date   HGBA1C 5.6 05/21/2020    Hypercholesterolemia Continue Atorvastatin 20 mg daily, will adjust dose if needed. Low fat diet.  Essential hypertension, benign BP adequately controlled. No changes in current management. Low salt diet.  Anxiety disorder Stable. No changes in Lorazepam or Celexa. He understands side effects.  Prediabetes A1C improved. Continue healthy life style for primary prevention.  Senile ecchymosis Reassured. Aspirin and some of his med  could aggravate problem.  Need for influenza vaccination -     Flu Vaccine QUAD High Dose(Fluad)  Encounter for HCV screening test for low risk patient -     Hepatitis  C antibody   Return in about 6 months (around 11/18/2020) for HTN,anxiety..   Dvon Jiles G. [name], MD  [facility]. [place] office.

## 2020-05-21 NOTE — Patient Instructions (Signed)
A few things to remember from today's visit:   Essential hypertension, benign  Insomnia, unspecified type  Other specified anxiety disorders  Need for influenza vaccination - Plan: Flu Vaccine QUAD High Dose(Fluad)  Hypercholesterolemia - Plan: Lipid panel, POC HgB A1c  Encounter for HCV screening test for low risk patient - Plan: Hepatitis C antibody  Senile ecchymosis  Try to avoid trauma. Aspirin,celexa,and prednisone can make bruising worse.  If you need refills please call your pharmacy. Do not use My Chart to request refills or for acute issues that need immediate attention.    Please be sure medication list is accurate. If a new problem present, please set up appointment sooner than planned today.        No changes today.

## 2020-05-21 NOTE — Assessment & Plan Note (Signed)
BP adequately controlled. No changes in current management. Low salt diet.

## 2020-05-22 LAB — HEPATITIS C ANTIBODY
Hepatitis C Ab: NONREACTIVE
SIGNAL TO CUT-OFF: 0.01 (ref <1.00)

## 2020-05-22 LAB — LIPID PANEL
Cholesterol: 139 mg/dL (ref <200)
HDL: 51 mg/dL (ref >=40)
LDL Cholesterol (Calc): 71 mg/dL (calc)
Non-HDL Cholesterol (Calc): 88 mg/dL (calc) (ref <130)
Total CHOL/HDL Ratio: 2.7 (calc) (ref <5.0)
Triglycerides: 89 mg/dL (ref <150)

## 2020-05-28 MED FILL — ABIRATERONE ACETATE 250 MG: 250 | 30 days supply | Qty: 120 | Fill #1

## 2020-06-02 ENCOUNTER — Other Ambulatory Visit: Payer: Self-pay | Admitting: Oncology

## 2020-06-02 ENCOUNTER — Other Ambulatory Visit: Payer: Self-pay | Admitting: Family Medicine

## 2020-06-02 DIAGNOSIS — I10 Essential (primary) hypertension: Secondary | ICD-10-CM

## 2020-06-02 DIAGNOSIS — C61 Malignant neoplasm of prostate: Secondary | ICD-10-CM

## 2020-06-17 ENCOUNTER — Ambulatory Visit (INDEPENDENT_AMBULATORY_CARE_PROVIDER_SITE_OTHER): Payer: Medicare Other

## 2020-06-17 DIAGNOSIS — I495 Sick sinus syndrome: Secondary | ICD-10-CM

## 2020-06-17 LAB — CUP PACEART REMOTE DEVICE CHECK
Battery Remaining Longevity: 142 mo
Battery Voltage: 3.03 V
Brady Statistic AP VP Percent: 0.04 %
Brady Statistic AP VS Percent: 89.85 %
Brady Statistic AS VP Percent: 0.01 %
Brady Statistic AS VS Percent: 10.1 %
Brady Statistic RA Percent Paced: 89.63 %
Brady Statistic RV Percent Paced: 0.05 %
Date Time Interrogation Session: 20211004065339
Implantable Lead Implant Date: 20191001
Implantable Lead Implant Date: 20191001
Implantable Lead Location: 753859
Implantable Lead Location: 753860
Implantable Lead Model: 5076
Implantable Lead Model: 5076
Implantable Pulse Generator Implant Date: 20191001
Lead Channel Impedance Value: 304 Ohm
Lead Channel Impedance Value: 323 Ohm
Lead Channel Impedance Value: 418 Ohm
Lead Channel Impedance Value: 456 Ohm
Lead Channel Pacing Threshold Amplitude: 0.5 V
Lead Channel Pacing Threshold Amplitude: 0.75 V
Lead Channel Pacing Threshold Pulse Width: 0.4 ms
Lead Channel Pacing Threshold Pulse Width: 0.4 ms
Lead Channel Sensing Intrinsic Amplitude: 2.625 mV
Lead Channel Sensing Intrinsic Amplitude: 2.625 mV
Lead Channel Sensing Intrinsic Amplitude: 3.375 mV
Lead Channel Sensing Intrinsic Amplitude: 3.375 mV
Lead Channel Setting Pacing Amplitude: 1.5 V
Lead Channel Setting Pacing Amplitude: 2.5 V
Lead Channel Setting Pacing Pulse Width: 0.4 ms
Lead Channel Setting Sensing Sensitivity: 0.9 mV

## 2020-06-18 NOTE — Progress Notes (Signed)
Remote pacemaker transmission.   

## 2020-06-19 ENCOUNTER — Other Ambulatory Visit: Payer: Self-pay

## 2020-06-19 ENCOUNTER — Ambulatory Visit (INDEPENDENT_AMBULATORY_CARE_PROVIDER_SITE_OTHER): Payer: Medicare Other

## 2020-06-19 DIAGNOSIS — Z0000 Encounter for general adult medical examination without abnormal findings: Secondary | ICD-10-CM

## 2020-06-19 NOTE — Progress Notes (Signed)
Virtual Visit via Telephone Note  I connected with  Tony [name] on 06/19/20 at  9:30 AM EDT by telephone and verified that I am speaking with the correct person using two identifiers.  Medicare Annual Wellness visit completed telephonically due to Covid-19 pandemic.   Persons participating in this call: This Health Coach and this patient.   Location: Patient: Home Provider: Office   I discussed the limitations, risks, security and privacy concerns of performing an evaluation and management service by telephone and the availability of in person appointments. The patient expressed understanding and agreed to proceed.  Unable to perform video visit due to video visit attempted and failed and/or patient does not have video capability.   Some vital signs may be absent or patient reported.   Willette Brace, LPN    Subjective:   Tony [name] is a 79 y.o. male who presents for Medicare Annual/Subsequent preventive examination.  Review of Systems     Cardiac Risk Factors include: male gender;sedentary lifestyle;obesity (BMI >30kg/m2);hypertension     Objective:    There were no vitals filed for this visit. There is no height or weight on file to calculate BMI.  Advanced Directives 06/19/2020 12/21/2019 09/21/2019 12/02/2018 06/14/2018 02/03/2018 02/03/2018  Does Patient Have a Medical Advance Directive? No No No No No No -  Would patient like information on creating a medical advance directive? No - Patient declined No - Patient declined Yes (MAU/Ambulatory/Procedural Areas - Information given) No - Patient declined No - Patient declined No - Patient declined No - Patient declined    Current Medications (verified) Outpatient Encounter Medications as of 06/19/2020  Medication Sig  . abiraterone acetate (ZYTIGA) 250 MG tablet TAKE 4 TABLETS (1,000 MG TOTAL) BY MOUTH DAILY. TAKE ON AN EMPTY STOMACH 1 HOUR BEFORE OR 2 HOURS AFTER A MEAL  . acetaminophen (TYLENOL) 500 MG tablet  Take 500 mg by mouth every 6 (six) hours as needed (for pain).  [name] albuterol (VENTOLIN HFA) 108 (90 Base) MCG/ACT inhaler Inhale 2 puffs into the lungs every 6 (six) hours as needed for wheezing or shortness of breath.  [name] amLODipine (NORVASC) 2.5 MG tablet TAKE 1 TABLET BY MOUTH EVERY DAY  . aspirin EC 81 MG tablet Take 81 mg by mouth daily.  [name] atorvastatin (LIPITOR) 20 MG tablet TAKE 1 TABLET BY MOUTH EVERY DAY  . carbamide peroxide (DEBROX) 6.5 % OTIC solution Place 5 drops into both ears daily as needed (for earwax).  . citalopram (CELEXA) 10 MG tablet TAKE 1 TABLET BY MOUTH EVERY DAY  . diclofenac sodium (VOLTAREN) 1 % GEL Apply 4 g topically 4 (four) times daily.  . fluticasone (FLONASE) 50 MCG/ACT nasal spray PLACE 1 SPRAY INTO BOTH NOSTRILS DAILY AS NEEDED FOR ALLERGIES OR RHINITIS (AT BEDTIME).  . Fluticasone-Salmeterol (ADVAIR) 100-50 MCG/DOSE AEPB Inhale 1 puff into the lungs 2 (two) times daily.  . furosemide (LASIX) 20 MG tablet TAKE 1 TABLET BY MOUITH DAILY FOR 5 DAYS , THEN DAILY AS NEEDED FOR EDEMA  . LORazepam (ATIVAN) 1 MG tablet TAKE 1 TABLET (1 MG TOTAL) BY MOUTH 2 (TWO) TIMES DAILY AS NEEDED.  . metoprolol tartrate (LOPRESSOR) 25 MG tablet TAKE 1 TABLET BY MOUTH TWICE A DAY  . Multiple Vitamin (MULTIVITAMIN WITH MINERALS) TABS tablet Take 0.5 tablets by mouth every other day. Centrum  . omeprazole (PRILOSEC) 20 MG capsule TAKE 1 CAPSULE (20 MG TOTAL) BY MOUTH DAILY BEFORE BREAKFAST.  [name] polyethylene glycol powder (GLYCOLAX/MIRALAX) powder Take 17  g by mouth daily as needed for moderate constipation.   . potassium chloride SA (KLOR-CON M20) 20 MEQ tablet Take 1 tablet (20 mEq total) by mouth 2 (two) times daily.  . predniSONE (DELTASONE) 5 MG tablet TAKE 1 TABLET BY MOUTH EVERY DAY WITH BREAKFAST  . senna (SENOKOT) 8.6 MG TABS tablet Take 2 tablets by mouth daily as needed for mild constipation.   No facility-administered encounter medications on file as of 06/19/2020.     Allergies (verified) Penicillins   History: Past Medical History:  Diagnosis Date  . Gastric outlet obstruction 02/03/2018   [name] 02/03/2018  . GERD (gastroesophageal reflux disease)   . History of hiatal hernia    small/notes 02/03/2018  . Hypertension    no meds  . Presence of permanent cardiac pacemaker   . 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 Radiation   . Sinus problem   . SSS (sick sinus syndrome) [facility])    Past Surgical History:  Procedure Laterality Date  . APPENDECTOMY  2019   hx hematoma s/p appendectomy  . ESOPHAGOGASTRODUODENOSCOPY (EGD) WITH PROPOFOL N/A 02/03/2018   Procedure: ESOPHAGOGASTRODUODENOSCOPY (EGD) WITH PROPOFOL;  Surgeon: Clarene Essex, MD;  Location: [place];  Service: Endoscopy;  Laterality: N/A;  . FRACTURE SURGERY    . INSERT / REPLACE / REMOVE PACEMAKER  06/14/2018  . LAPAROSCOPIC APPENDECTOMY N/A 01/21/2018   Procedure: APPENDECTOMY LAPAROSCOPIC;  Surgeon: Kinsinger, Arta Bruce, MD;  Location: [place];  Service: General;  Laterality: N/A;  . PACEMAKER IMPLANT N/A 06/14/2018   Procedure: PACEMAKER IMPLANT - Dual Chamber;  Surgeon: Sanda Klein, MD;  Location: [place] [facility];  Service: Cardiovascular;  Laterality: N/A;  . PROSTATE BIOPSY  02/08/14   gleason 4+5=9, 12/12 cores positive, 54 gm  . RADIOACTIVE SEED IMPLANT N/A 06/29/2014   Procedure: RADIOACTIVE SEED IMPLANT;  Surgeon: Bernestine Amass, MD;  Location: [facility];  Service: Urology;  Laterality: N/A;  . WRIST FRACTURE SURGERY Right 1980s   Family History  Problem Relation Age of Onset  . Alzheimer's disease Mother   . Alzheimer's disease Father   . Cancer Father        prostate  . Arthritis Sister   . Cancer Brother        prostate   Social History   Socioeconomic History  . Marital status: Married    Spouse name: Not on file  . Number of children: 3  . Years of education: Not on file  . Highest education level: Not on file   Occupational History  . 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 Smoking status: Former Smoker    Years: 60.00    Types: Cigars    Quit date: 09/14/2012    Years since quitting: 7.7  . Smokeless tobacco: Never Used  . Tobacco comment: little cigars; the small ones"  Vaping Use  . Vaping Use: Never used  Substance and Sexual Activity  . Alcohol use: Yes    Alcohol/week: 14.0 standard drinks    Types: 14 Cans of beer per week  . Drug use: No  . Sexual activity: Not Currently  Other Topics Concern  . Not on file  Social History Narrative   Lives with wife and 2 dogs in one level home; daughter and her family co-habitate.   Has three daughters, all in [place], supportive.  Five grandchildren.   Wants to return to doing silver sneakers once gym re-opens.         Social Determinants of Health   Financial Resource Strain: Low Risk   . Difficulty of Paying Living Expenses: Not hard at all  Food Insecurity: No Food Insecurity  . Worried About Charity fundraiser in the Last Year: Never true  . Ran Out of Food in the Last Year: Never true  Transportation Needs: No Transportation Needs  . Lack of Transportation (Medical): No  . Lack of Transportation (Non-Medical): No  Physical Activity: Inactive  . Days of Exercise per Week: 0 days  . Minutes of Exercise per Session: 0 min  Stress: No Stress Concern Present  . Feeling of Stress : Not at all  Social Connections: Moderately Isolated  . Frequency of Communication with Friends and Family: Once a week  . Frequency of Social Gatherings with Friends and Family: Never  . Attends Religious Services: 1 to 4 times per year  . Active Member of Clubs or Organizations: No  . Attends Archivist Meetings: Never  . Marital Status: Married    Tobacco Counseling Counseling given: Not Answered Comment: little cigars; the small  ones"   Clinical Intake:  Pre-visit preparation completed: Yes  Pain : No/denies pain     BMI - recorded: 33.67 Nutritional Status: BMI > 30  Obese Nutritional Risks: None Diabetes: No     Diabetic?No         Activities of Daily Living In your present state of health, do you have any difficulty performing the following activities: 06/19/2020 05/21/2020  Hearing? N N  Vision? N N  Difficulty concentrating or making decisions? N N  Walking or climbing stairs? N N  Dressing or bathing? N N  Doing errands, shopping? N N  Preparing Food and eating ? N -  Using the Toilet? N -  In the past six months, have you accidently leaked urine? N -  Do you have problems with loss of bowel control? N -  Managing your Medications? N -  Managing your Finances? N -  Housekeeping or managing your Housekeeping? N -  Some recent data might be hidden    Patient Care Team: [name], [name], MD as PCP - General (Family Medicine) Croitoru, Dani Gobble, MD as PCP - Cardiology (Cardiology) Wyatt Portela, MD as Consulting Physician (Oncology) Germaine Pomfret, [facility] as Pharmacist (Pharmacist)  Indicate any recent Medical Services you may have received from other than [facility] providers in the past year (date may be approximate).     Assessment:   This is a routine wellness examination for [place].  Hearing/Vision screen  Hearing Screening   125Hz  250Hz  500Hz  1000Hz  2000Hz  3000Hz  4000Hz  6000Hz  8000Hz   Right ear:           Left ear:           Comments: Pt denies any difficulty with hearing at this time  Vision Screening Comments: Pt hasn't been in 2 years stated he had 20/20 and will follow up soon  Dietary issues and exercise activities discussed: Current Exercise Habits: The patient does not participate in regular exercise at present  Goals    . Chronic Care Management     CARE PLAN ENTRY (see longitudinal plan of care for additional care plan information)  Current Barriers:  . Chronic  Disease Management support, education, and care coordination needs related to Hypertension, Hyperlipidemia, Coronary Artery Disease, Anxiety and Prostate Cancer,  and Insomnia    Hypertension BP Readings from Last 3 Encounters:  04/03/20 (!) 137/97  12/21/19 (!) 141/84  11/14/19 130/80   . Pharmacist Clinical Goal(s): o Over the next 90 days, patient will work with PharmD and providers to achieve BP goal <140/90 . Current regimen:  . Amlodipine 2.5 mg daily  . Furosemide 20 mg daily PRN  . Metoprolol tartrate 25 mg twice daily . Interventions: o Discussed low salt diet and exercising as tolerated extensively . Patient self care activities - Over the next 90 days, patient will: o Check Blood Pressure weekly, document, and provide at future appointments o Ensure daily salt intake < 2300 mg/day  Hyperlipidemia Lab Results  Component Value Date/Time   LDLCALC 125 (H) 06/15/2018 02:22 AM   . Pharmacist Clinical Goal(s): o Over the next 90 days, patient will work with PharmD and providers to achieve LDL goal < 100 . Current regimen:  o Atorvastatin 20 mg daily . Interventions: o Discussed low cholesterol diet and exercising as tolerated extensively  Medication management . Pharmacist Clinical Goal(s): o Over the next 90 days, patient will work with PharmD and providers to achieve optimal medication adherence . Current pharmacy: [facility] . Interventions o Comprehensive medication review performed. o Continue current medication management strategy . Patient self care activities - Over the next 90 days, patient will: o Take medications as prescribed o Report any questions or concerns to PharmD and/or provider(s)    . get well and walk better    . Patient Stated     Get well and maintain health     . Patient Stated     Re-start silver sneakers, walking outside.  Start a new hobby?      Depression Screen PHQ 2/9 Scores 06/19/2020 05/23/2020 03/13/2019 12/02/2018 11/30/2017 10/19/2017  05/24/2014  PHQ - 2 Score 0 0 0 0 0 0 0  PHQ- 9 Score - - - 0 - - -    Fall Risk Fall Risk  06/19/2020 05/23/2020 12/02/2018 11/30/2017 05/24/2014  Falls in the past year? 1 0 0 No No  Number falls in past yr: 1 0 - - -  Injury with Fall? 1 0 - - -  Comment bruised shoulder left - - - -  Risk for fall due to : Impaired balance/gait;Impaired mobility;Impaired vision;History of fall(s) - Medication side effect - -  Follow up Falls prevention discussed - Education provided;Falls prevention discussed - -    Any stairs in or around the home? Yes  If so, are there any without handrails? Yes  Home free of loose throw rugs in walkways, pet beds, electrical cords, etc? Yes  Adequate lighting in your home to reduce risk of falls? Yes   ASSISTIVE DEVICES UTILIZED TO PREVENT FALLS:  Life alert? No  Use of a cane, walker or w/c? No  would like a cane or walker Grab bars in the bathroom? Yes  Shower chair or bench in shower? Yes  Elevated toilet seat or a handicapped toilet? Yes   TIMED UP AND GO:  Was the test performed? No .     Cognitive Function: MMSE - Mini Mental State Exam 12/02/2018 11/30/2017  Orientation to time 5 5  Orientation to Place 5 5  Registration 3 3  Attention/ Calculation 4 1  Recall 2 1  Language- name 2 objects 1 2  Language- name 2 objects-comments could not correctly identify picture of bird (flamingo) -  Language- repeat 1 1  Language- follow 3 step command  3 3  Language- read & follow direction 1 1  Write a sentence 1 1  Copy design 0 1  Copy design-comments objects had 4 sides, not 5 -  Total score 26 24     6CIT Screen 06/19/2020  What Year? 0 points  What month? 0 points  Count back from 20 0 points  Months in reverse 0 points  Repeat phrase 2 points    Immunizations Immunization History  Administered Date(s) Administered  . Fluad Quad(high Dose 65+) 05/17/2019, 05/21/2020  . Influenza, High Dose Seasonal PF 05/05/2016, 06/01/2017, 06/22/2018  .  Td 02/13/2019    TDAP status: Up to date Flu Vaccine status: Up to date Pneumococcal vaccine status: Declined,  Education has been provided regarding the importance of this vaccine but patient still declined. Advised may receive this vaccine at local pharmacy or Health Dept. Aware to provide a copy of the vaccination record if obtained from local pharmacy or Health Dept. Verbalized acceptance and understanding.  Covid-19 vaccine status: Completed vaccines  Qualifies for Shingles Vaccine? Yes   Zostavax completed No   Shingrix Completed?: No.    Education has been provided regarding the importance of this vaccine. Patient has been advised to call insurance company to determine out of pocket expense if they have not yet received this vaccine. Advised may also receive vaccine at local pharmacy or Health Dept. Verbalized acceptance and understanding.  Screening Tests Health Maintenance  Topic Date Due  . COVID-19 Vaccine (1) Never done  . PNA vac Low Risk Adult (2 of 2 - PPSV23) 12/12/2039 (Originally 09/14/2012)  . TETANUS/TDAP  02/12/2029  . INFLUENZA VACCINE  Completed  . Hepatitis C Screening  Completed    Health Maintenance  Health Maintenance Due  Topic Date Due  . COVID-19 Vaccine (1) Never done    Colorectal cancer screening: Completed 05/19/18. Repeat every 0 years   Additional Screening:  Hepatitis C Screening: Completed 05/21/20  Vision Screening: Recommended annual ophthalmology exams for early detection of glaucoma and other disorders of the eye. Is the patient up to date with their annual eye exam?  Yes  Who is the provider or what is the name of the office in which the patient attends annual eye exams? [facility]   Dental Screening: Recommended annual dental exams for proper oral hygiene  Community Resource Referral / Chronic Care Management: CRR required this visit?  No   CCM required this visit?  No      Plan:     I have personally reviewed and noted the  following in the patient's chart:   . Medical and social history . Use of alcohol, tobacco or illicit drugs  . Current medications and supplements . Functional ability and status . Nutritional status . Physical activity . Advanced directives . List of other physicians . Hospitalizations, surgeries, and ER visits in previous 12 months . Vitals . Screenings to include cognitive, depression, and falls . Referrals and appointments  In addition, I have reviewed and discussed with patient certain preventive protocols, quality metrics, and best practice recommendations. A written personalized care plan for preventive services as well as general preventive health recommendations were provided to patient.     Willette Brace, LPN   [id number]   Nurse Notes:none

## 2020-06-19 NOTE — Patient Instructions (Addendum)
Tony [name] , Thank you for taking time to come for your Medicare Wellness Visit. I appreciate your ongoing commitment to your health goals. Please review the following plan we discussed and let me know if I can assist you in the future.   Screening recommendations/referrals: Colonoscopy: Done 05/19/18 Recommended yearly ophthalmology/optometry visit for glaucoma screening and checkup Recommended yearly dental visit for hygiene and checkup  Vaccinations: Influenza vaccine: Done 05/21/20 Pneumococcal vaccine: PCV 13 12/27/13 Tdap vaccine: Done 02/13/19 Shingles vaccine: Shingrix discussed. Please contact your pharmacy for coverage information.    Covid-19: Completed 1/21, 2/11, & 06/18/20  Advanced directives: Advance directive discussed with you today. Even though you declined this today please call our office should you change your mind and we can give you the proper paperwork for you to fill out.  Conditions/risks identified: Get to walking better  Next appointment: Follow up in one year for your annual wellness visit.   Preventive Care 5 Years and Older, Male Preventive care refers to lifestyle choices and visits with your health care provider that can promote health and wellness. What does preventive care include?  A yearly physical exam. This is also called an annual well check.  Dental exams once or twice a year.  Routine eye exams. Ask your health care provider how often you should have your eyes checked.  Personal lifestyle choices, including:  Daily care of your teeth and gums.  Regular physical activity.  Eating a healthy diet.  Avoiding tobacco and drug use.  Limiting alcohol use.  Practicing safe sex.  Taking low doses of aspirin every day.  Taking vitamin and mineral supplements as recommended by your health care provider. What happens during an annual well check? The services and screenings done by your health care provider during your annual well check will  depend on your age, overall health, lifestyle risk factors, and family history of disease. Counseling  Your health care provider may ask you questions about your:  Alcohol use.  Tobacco use.  Drug use.  Emotional well-being.  Home and relationship well-being.  Sexual activity.  Eating habits.  History of falls.  Memory and ability to understand (cognition).  Work and work Statistician. Screening  You may have the following tests or measurements:  Height, weight, and BMI.  Blood pressure.  Lipid and cholesterol levels. These may be checked every 5 years, or more frequently if you are over 30 years old.  Skin check.  Lung cancer screening. You may have this screening every year starting at age 37 if you have a 30-pack-year history of smoking and currently smoke or have quit within the past 15 years.  Fecal occult blood test (FOBT) of the stool. You may have this test every year starting at age 63.  Flexible sigmoidoscopy or colonoscopy. You may have a sigmoidoscopy every 5 years or a colonoscopy every 10 years starting at age 18.  Prostate cancer screening. Recommendations will vary depending on your family history and other risks.  Hepatitis C blood test.  Hepatitis B blood test.  Sexually transmitted disease (STD) testing.  Diabetes screening. This is done by checking your blood sugar (glucose) after you have not eaten for a while (fasting). You may have this done every 1-3 years.  Abdominal aortic aneurysm (AAA) screening. You may need this if you are a current or former smoker.  Osteoporosis. You may be screened starting at age 16 if you are at high risk. Talk with your health care provider about your test results,  treatment options, and if necessary, the need for more tests. Vaccines  Your health care provider may recommend certain vaccines, such as:  Influenza vaccine. This is recommended every year.  Tetanus, diphtheria, and acellular pertussis (Tdap,  Td) vaccine. You may need a Td booster every 10 years.  Zoster vaccine. You may need this after age 1.  Pneumococcal 13-valent conjugate (PCV13) vaccine. One dose is recommended after age 61.  Pneumococcal polysaccharide (PPSV23) vaccine. One dose is recommended after age 65. Talk to your health care provider about which screenings and vaccines you need and how often you need them. This information is not intended to replace advice given to you by your health care provider. Make sure you discuss any questions you have with your health care provider. Document Released: 09/27/2015 Document Revised: 05/20/2016 Document Reviewed: 07/02/2015 Elsevier Interactive Patient Education  2017 [place] Prevention in the Home Falls can cause injuries. They can happen to people of all ages. There are many things you can do to make your home safe and to help prevent falls. What can I do on the outside of my home?  Regularly fix the edges of walkways and driveways and fix any cracks.  Remove anything that might make you trip as you walk through a door, such as a raised step or threshold.  Trim any bushes or trees on the path to your home.  Use bright outdoor lighting.  Clear any walking paths of anything that might make someone trip, such as rocks or tools.  Regularly check to see if handrails are loose or broken. Make sure that both sides of any steps have handrails.  Any raised decks and porches should have guardrails on the edges.  Have any leaves, snow, or ice cleared regularly.  Use sand or salt on walking paths during winter.  Clean up any spills in your garage right away. This includes oil or grease spills. What can I do in the bathroom?  Use night lights.  Install grab bars by the toilet and in the tub and shower. Do not use towel bars as grab bars.  Use non-skid mats or decals in the tub or shower.  If you need to sit down in the shower, use a plastic, non-slip  stool.  Keep the floor dry. Clean up any water that spills on the floor as soon as it happens.  Remove soap buildup in the tub or shower regularly.  Attach bath mats securely with double-sided non-slip rug tape.  Do not have throw rugs and other things on the floor that can make you trip. What can I do in the bedroom?  Use night lights.  Make sure that you have a light by your bed that is easy to reach.  Do not use any sheets or blankets that are too big for your bed. They should not hang down onto the floor.  Have a firm chair that has side arms. You can use this for support while you get dressed.  Do not have throw rugs and other things on the floor that can make you trip. What can I do in the kitchen?  Clean up any spills right away.  Avoid walking on wet floors.  Keep items that you use a lot in easy-to-reach places.  If you need to reach something above you, use a strong step stool that has a grab bar.  Keep electrical cords out of the way.  Do not use floor polish or wax that makes floors  slippery. If you must use wax, use non-skid floor wax.  Do not have throw rugs and other things on the floor that can make you trip. What can I do with my stairs?  Do not leave any items on the stairs.  Make sure that there are handrails on both sides of the stairs and use them. Fix handrails that are broken or loose. Make sure that handrails are as long as the stairways.  Check any carpeting to make sure that it is firmly attached to the stairs. Fix any carpet that is loose or worn.  Avoid having throw rugs at the top or bottom of the stairs. If you do have throw rugs, attach them to the floor with carpet tape.  Make sure that you have a light switch at the top of the stairs and the bottom of the stairs. If you do not have them, ask someone to add them for you. What else can I do to help prevent falls?  Wear shoes that:  Do not have high heels.  Have rubber bottoms.  Are  comfortable and fit you well.  Are closed at the toe. Do not wear sandals.  If you use a stepladder:  Make sure that it is fully opened. Do not climb a closed stepladder.  Make sure that both sides of the stepladder are locked into place.  Ask someone to hold it for you, if possible.  Clearly mark and make sure that you can see:  Any grab bars or handrails.  First and last steps.  Where the edge of each step is.  Use tools that help you move around (mobility aids) if they are needed. These include:  Canes.  Walkers.  Scooters.  Crutches.  Turn on the lights when you go into a dark area. Replace any light bulbs as soon as they burn out.  Set up your furniture so you have a clear path. Avoid moving your furniture around.  If any of your floors are uneven, fix them.  If there are any pets around you, be aware of where they are.  Review your medicines with your doctor. Some medicines can make you feel dizzy. This can increase your chance of falling. Ask your doctor what other things that you can do to help prevent falls. This information is not intended to replace advice given to you by your health care provider. Make sure you discuss any questions you have with your health care provider. Document Released: 06/27/2009 Document Revised: 02/06/2016 Document Reviewed: 10/05/2014 Elsevier Interactive Patient Education  2017 [employer].

## 2020-06-26 MED FILL — ABIRATERONE ACETATE 250 MG: 250 | 30 days supply | Qty: 120 | Fill #2

## 2020-07-03 ENCOUNTER — Inpatient Hospital Stay: Payer: Medicare Other

## 2020-07-03 ENCOUNTER — Other Ambulatory Visit: Payer: Medicare Other

## 2020-07-03 ENCOUNTER — Inpatient Hospital Stay: Payer: Medicare Other | Attending: Oncology | Admitting: Oncology

## 2020-07-03 ENCOUNTER — Other Ambulatory Visit: Payer: Self-pay

## 2020-07-03 VITALS — BP 123/93 | HR 88 | Temp 97.2°F | Resp 18 | Ht 69.0 in | Wt 228.4 lb

## 2020-07-03 DIAGNOSIS — C61 Malignant neoplasm of prostate: Secondary | ICD-10-CM

## 2020-07-03 DIAGNOSIS — E876 Hypokalemia: Secondary | ICD-10-CM | POA: Diagnosis not present

## 2020-07-03 DIAGNOSIS — I10 Essential (primary) hypertension: Secondary | ICD-10-CM | POA: Insufficient documentation

## 2020-07-03 DIAGNOSIS — Z5111 Encounter for antineoplastic chemotherapy: Secondary | ICD-10-CM | POA: Insufficient documentation

## 2020-07-03 LAB — CBC WITH DIFFERENTIAL (CANCER CENTER ONLY)
Abs Immature Granulocytes: 0.04 10*3/uL (ref 0.00–0.07)
Basophils Absolute: 0 10*3/uL (ref 0.0–0.1)
Basophils Relative: 1 %
Eosinophils Absolute: 0.2 10*3/uL (ref 0.0–0.5)
Eosinophils Relative: 2 %
HCT: 43.9 % (ref 39.0–52.0)
Hemoglobin: 14.5 g/dL (ref 13.0–17.0)
Immature Granulocytes: 1 %
Lymphocytes Relative: 20 %
Lymphs Abs: 1.3 10*3/uL (ref 0.7–4.0)
MCH: 31 pg (ref 26.0–34.0)
MCHC: 33 g/dL (ref 30.0–36.0)
MCV: 93.8 fL (ref 80.0–100.0)
Monocytes Absolute: 0.6 10*3/uL (ref 0.1–1.0)
Monocytes Relative: 9 %
Neutro Abs: 4.6 10*3/uL (ref 1.7–7.7)
Neutrophils Relative %: 67 %
Platelet Count: 179 10*3/uL (ref 150–400)
RBC: 4.68 MIL/uL (ref 4.22–5.81)
RDW: 13.1 % (ref 11.5–15.5)
WBC Count: 6.8 10*3/uL (ref 4.0–10.5)
nRBC: 0 % (ref 0.0–0.2)

## 2020-07-03 LAB — CMP (CANCER CENTER ONLY)
ALT: 14 U/L (ref 0–44)
AST: 16 U/L (ref 15–41)
Albumin: 3.7 g/dL (ref 3.5–5.0)
Alkaline Phosphatase: 80 U/L (ref 38–126)
Anion gap: 7 (ref 5–15)
BUN: 18 mg/dL (ref 8–23)
CO2: 29 mmol/L (ref 22–32)
Calcium: 9.3 mg/dL (ref 8.9–10.3)
Chloride: 105 mmol/L (ref 98–111)
Creatinine: 1.03 mg/dL (ref 0.61–1.24)
GFR, Estimated: >60 mL/min (ref >60)
Glucose, Bld: 111 mg/dL — ABNORMAL HIGH (ref 70–99)
Potassium: 4.8 mmol/L (ref 3.5–5.1)
Sodium: 141 mmol/L (ref 135–145)
Total Bilirubin: 0.6 mg/dL (ref 0.3–1.2)
Total Protein: 6.7 g/dL (ref 6.5–8.1)

## 2020-07-03 MED ORDER — LEUPROLIDE ACETATE (3 MONTH) 22.5 MG ~~LOC~~ KIT
PACK | SUBCUTANEOUS | Status: AC
  Filled 2020-07-03: qty 22.5

## 2020-07-03 MED ORDER — LEUPROLIDE ACETATE (3 MONTH) 22.5 MG ~~LOC~~ KIT
22.5000 mg | PACK | Freq: Once | SUBCUTANEOUS | Status: AC
  Administered 2020-07-03: 22.5 mg via SUBCUTANEOUS

## 2020-07-03 NOTE — Progress Notes (Signed)
Hematology and Oncology Follow Up Visit  Tony [name] [id number] March 12, 1941 79 y.o. 07/03/2020 1:06 PM Tony [name], [name], [name], MD   Principle Diagnosis: 79 year old man with advanced prostate cancer diagnosed in 2017.  He has castration-resistant with biochemical relapse noted after presenting with Gleason score 4+5 = 9 and a PSA 15.7.    Prior Therapy:  He is status post radiation therapy for external beam radiation completed in September 2015 for a total of 45 gray. He also status post seed implant brachytherapy boost completed in October 2015.   He received total of 1 year of androgen deprivation 22,015 and 2016. His PSA nadir was to 0.47 in November 2016. His PSA was 4.08 in May 2017.  Current therapy:  Eligard 22.5 mg every 3 months.  He will receive injection in July and repeated in 3 months.  Zytiga 1000 mg with prednisone started in August 2017.   Interim History: Tony [name] is here for a follow-up visit.  Since the last visit, he reports no major changes in his health.  He has tolerated Zytiga without any complaints.  Denies excessive fatigue or tiredness.  He denies any bone pain or pathological fractures.  His performance status and quality of life remains maintained.  He ambulates with the help of a cane and not driving status.         Medications: Unchanged on review. Current Outpatient Medications  Medication Sig Dispense Refill  . abiraterone acetate (ZYTIGA) 250 MG tablet TAKE 4 TABLETS (1,000 MG TOTAL) BY MOUTH DAILY. TAKE ON AN EMPTY STOMACH 1 HOUR BEFORE OR 2 HOURS AFTER A MEAL 120 tablet 2  . acetaminophen (TYLENOL) 500 MG tablet Take 500 mg by mouth every 6 (six) hours as needed (for pain).    [name] albuterol (VENTOLIN HFA) 108 (90 Base) MCG/ACT inhaler Inhale 2 puffs into the lungs every 6 (six) hours as needed for wheezing or shortness of breath. 8.5 [name] 2  . amLODipine (NORVASC) 2.5 MG tablet TAKE 1 TABLET BY MOUTH EVERY DAY 90 tablet 2  .  aspirin EC 81 MG tablet Take 81 mg by mouth daily.    [name] atorvastatin (LIPITOR) 20 MG tablet TAKE 1 TABLET BY MOUTH EVERY DAY 90 tablet 2  . carbamide peroxide (DEBROX) 6.5 % OTIC solution Place 5 drops into both ears daily as needed (for earwax).    . citalopram (CELEXA) 10 MG tablet TAKE 1 TABLET BY MOUTH EVERY DAY 90 tablet 3  . diclofenac sodium (VOLTAREN) 1 % GEL Apply 4 [name] topically 4 (four) times daily. 4 Tube 3  . fluticasone (FLONASE) 50 MCG/ACT nasal spray PLACE 1 SPRAY INTO BOTH NOSTRILS DAILY AS NEEDED FOR ALLERGIES OR RHINITIS (AT BEDTIME). 48 mL 1  . Fluticasone-Salmeterol (ADVAIR) 100-50 MCG/DOSE AEPB Inhale 1 puff into the lungs 2 (two) times daily. 1 each 3  . furosemide (LASIX) 20 MG tablet TAKE 1 TABLET BY MOUITH DAILY FOR 5 DAYS , THEN DAILY AS NEEDED FOR EDEMA 90 tablet 1  . LORazepam (ATIVAN) 1 MG tablet TAKE 1 TABLET (1 MG TOTAL) BY MOUTH 2 (TWO) TIMES DAILY AS NEEDED. 60 tablet 3  . metoprolol tartrate (LOPRESSOR) 25 MG tablet TAKE 1 TABLET BY MOUTH TWICE A DAY 180 tablet 2  . Multiple Vitamin (MULTIVITAMIN WITH MINERALS) TABS tablet Take 0.5 tablets by mouth every other day. Centrum    . omeprazole (PRILOSEC) 20 MG capsule TAKE 1 CAPSULE (20 MG TOTAL) BY MOUTH DAILY BEFORE BREAKFAST. 90 capsule 3  .  polyethylene glycol powder (GLYCOLAX/MIRALAX) powder Take 17 [name] by mouth daily as needed for moderate constipation.     . potassium chloride SA (KLOR-CON M20) 20 MEQ tablet Take 1 tablet (20 mEq total) by mouth 2 (two) times daily. 180 tablet 0  . predniSONE (DELTASONE) 5 MG tablet TAKE 1 TABLET BY MOUTH EVERY DAY WITH BREAKFAST 90 tablet 0  . senna (SENOKOT) 8.6 MG TABS tablet Take 2 tablets by mouth daily as needed for mild constipation.     No current facility-administered medications for this visit.     Allergies:  Allergies  Allergen Reactions  . Penicillins Rash    Has patient had a PCN reaction causing immediate rash, facial/tongue/throat swelling, SOB or  lightheadedness with hypotension: Yes Has patient had a PCN reaction causing severe rash involving mucus membranes or skin necrosis: No Has patient had a PCN reaction that required hospitalization: No Has patient had a PCN reaction occurring within the last 10 years: No If all of the above answers are "NO", then may proceed with Cephalosporin use.      Physical Exam:    Blood pressure (!) 123/93, pulse 88, temperature (!) 97.2 F (36.2 C), temperature source Tympanic, resp. rate 18, height 5\' 9"  (1.753 m), weight 228 lb 6.4 oz (103.6 kg), SpO2 94 %.       ECOG: 1    General appearance: Comfortable appearing without any discomfort Head: Normocephalic without any trauma Oropharynx: Mucous membranes are moist and pink without any thrush or ulcers. Eyes: Pupils are equal and round reactive to light. Lymph nodes: No cervical, supraclavicular, inguinal or axillary lymphadenopathy.   Heart:regular rate and rhythm.  S1 and S2 without leg edema. Lung: Clear without any rhonchi or wheezes.  No dullness to percussion. Abdomin: Soft, nontender, nondistended with good bowel sounds.  No hepatosplenomegaly. Musculoskeletal: No joint deformity or effusion.  Full range of motion noted. Neurological: No deficits noted on motor, sensory and deep tendon reflex exam. Skin: No petechial rash or dryness.  Appeared moist.           Lab Results: Lab Results  Component Value Date   WBC 7.7 04/03/2020   HGB 14.6 04/03/2020   HCT 44.6 04/03/2020   MCV 93.1 04/03/2020   PLT 173 04/03/2020     Chemistry      Component Value Date/Time   NA 142 04/03/2020 0725   NA 139 02/03/2019 0857   NA 141 08/27/2017 0915   K 4.6 04/03/2020 0725   K 4.8 08/27/2017 0915   CL 104 04/03/2020 0725   CO2 27 04/03/2020 0725   CO2 26 08/27/2017 0915   BUN 16 04/03/2020 0725   BUN 13 02/03/2019 0857   BUN 16.2 08/27/2017 0915   CREATININE 1.07 04/03/2020 0725   CREATININE 0.9 08/27/2017 0915   GLU  105 01/10/2016 0000      Component Value Date/Time   CALCIUM 10.0 04/03/2020 0725   CALCIUM 9.8 08/27/2017 0915   ALKPHOS 87 04/03/2020 0725   ALKPHOS 83 08/27/2017 0915   AST 19 04/03/2020 0725   AST 23 08/27/2017 0915   ALT 17 04/03/2020 0725   ALT 17 08/27/2017 0915   BILITOT 1.1 04/03/2020 0725   BILITOT 0.95 08/27/2017 0915        Results for SAAFIR, ABDULLAH (MRN [id number]) as of 07/03/2020 13:08  Ref. Range 12/14/2019 10:45 04/03/2020 07:25  Prostate Specific Ag, Serum Latest Ref Range: 0.0 - 4.0 ng/mL <0.1 <0.1      Impression  and Plan:   79 year old man with:  1.  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  He continues to be on Zytiga which she has tolerated very well without any complications.  CT scan in May 2021 did not show any evidence of disease.  PSA continues to be undetectable and his disease status remains stable.  Risks and benefits of continuing this treatment were discussed at this time.  Complications including edema and hypertension on the others were reiterated.  Alternative options such as systemic chemotherapy will be deferred unless he developed progression of disease with the radiographic criteria.  He is agreeable to proceed with this plan.  2. Androgen depravation: He will receive Eligard today and repeated in 3 months.  Complications of this therapy including proper renal pain on reviewed.  Declined.   3. Hypokalemia: His potassium has been within normal range and will continue to be monitored on Zytiga.  4. Hypertension: Blood pressure is within normal range today and will continue to monitor on Zytiga.  5.  Prognosis and goals of care: His disease is incurable although aggressive measures are warranted given his excellent performance status and response to therapy.   6. Follow-up: He will continue to return every 3 weeks for repeat evaluation.  30  minutes were  dedicated to this encounter.  The time was spent on reviewing his disease status, reviewing laboratory data and outlining future plan of care.   Zola Button, MD 10/20/20211:06 PM

## 2020-07-04 ENCOUNTER — Telehealth: Payer: Self-pay

## 2020-07-04 DIAGNOSIS — I10 Essential (primary) hypertension: Secondary | ICD-10-CM

## 2020-07-04 LAB — PROSTATE-SPECIFIC AG, SERUM (LABCORP): Prostate Specific Ag, Serum: <0.1 ng/mL (ref 0.0–4.0)

## 2020-07-04 NOTE — Progress Notes (Signed)
Chronic Care Management Pharmacy Assistant   Name: Tony [name]  MRN: [id number] DOB: 06-Mar-1941  Reason for Encounter:Hypertension Disease State Call.[name]  PCP : [name], [name], MD  Allergies:   Allergies  Allergen Reactions  . Penicillins Rash    Has patient had a PCN reaction causing immediate rash, facial/tongue/throat swelling, SOB or lightheadedness with hypotension: Yes Has patient had a PCN reaction causing severe rash involving mucus membranes or skin necrosis: No Has patient had a PCN reaction that required hospitalization: No Has patient had a PCN reaction occurring within the last 10 years: No If all of the above answers are "NO", then may proceed with Cephalosporin use.    Medications: Outpatient Encounter Medications as of 07/04/2020  Medication Sig  . abiraterone acetate (ZYTIGA) 250 MG tablet TAKE 4 TABLETS (1,000 MG TOTAL) BY MOUTH DAILY. TAKE ON AN EMPTY STOMACH 1 HOUR BEFORE OR 2 HOURS AFTER A MEAL  . acetaminophen (TYLENOL) 500 MG tablet Take 500 mg by mouth every 6 (six) hours as needed (for pain).  [name] albuterol (VENTOLIN HFA) 108 (90 Base) MCG/ACT inhaler Inhale 2 puffs into the lungs every 6 (six) hours as needed for wheezing or shortness of breath.  [name] amLODipine (NORVASC) 2.5 MG tablet TAKE 1 TABLET BY MOUTH EVERY DAY  . aspirin EC 81 MG tablet Take 81 mg by mouth daily.  [name] atorvastatin (LIPITOR) 20 MG tablet TAKE 1 TABLET BY MOUTH EVERY DAY  . carbamide peroxide (DEBROX) 6.5 % OTIC solution Place 5 drops into both ears daily as needed (for earwax).  . citalopram (CELEXA) 10 MG tablet TAKE 1 TABLET BY MOUTH EVERY DAY  . diclofenac sodium (VOLTAREN) 1 % GEL Apply 4 g topically 4 (four) times daily.  . fluticasone (FLONASE) 50 MCG/ACT nasal spray PLACE 1 SPRAY INTO BOTH NOSTRILS DAILY AS NEEDED FOR ALLERGIES OR RHINITIS (AT BEDTIME).  . Fluticasone-Salmeterol (ADVAIR) 100-50 MCG/DOSE AEPB Inhale 1 puff into the lungs 2 (two) times daily.  . furosemide  (LASIX) 20 MG tablet TAKE 1 TABLET BY MOUITH DAILY FOR 5 DAYS , THEN DAILY AS NEEDED FOR EDEMA  . LORazepam (ATIVAN) 1 MG tablet TAKE 1 TABLET (1 MG TOTAL) BY MOUTH 2 (TWO) TIMES DAILY AS NEEDED.  . metoprolol tartrate (LOPRESSOR) 25 MG tablet TAKE 1 TABLET BY MOUTH TWICE A DAY  . Multiple Vitamin (MULTIVITAMIN WITH MINERALS) TABS tablet Take 0.5 tablets by mouth every other day. Centrum  . omeprazole (PRILOSEC) 20 MG capsule TAKE 1 CAPSULE (20 MG TOTAL) BY MOUTH DAILY BEFORE BREAKFAST.  [name] polyethylene glycol powder (GLYCOLAX/MIRALAX) powder Take 17 g by mouth daily as needed for moderate constipation.   . potassium chloride SA (KLOR-CON M20) 20 MEQ tablet Take 1 tablet (20 mEq total) by mouth 2 (two) times daily.  . predniSONE (DELTASONE) 5 MG tablet TAKE 1 TABLET BY MOUTH EVERY DAY WITH BREAKFAST  . senna (SENOKOT) 8.6 MG TABS tablet Take 2 tablets by mouth daily as needed for mild constipation.   No facility-administered encounter medications on file as of 07/04/2020.    Current Diagnosis: Patient Active Problem List   Diagnosis Date Noted  . 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 Morbid obesity ([place]) 11/14/2019  . Prediabetes 11/14/2019  . Varicose veins of both lower extremities 05/17/2019  . Bilateral lower extremity edema 03/13/2019  . Hypokalemia 03/13/2019  . 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 SSS (sick sinus syndrome) ([place]) 06/14/2018  . 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 Coronary artery disease involving native coronary artery of  native heart without angina pectoris 06/08/2018  . Sick sinus syndrome ([place]) 06/08/2018  . 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 Emphysema lung ([place]) 02/03/2018  . 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 Acute appendicitis 01/21/2018  . Perforated appendicitis 01/21/2018  . Chronic fatigue 06/01/2017  . Essential hypertension, benign 07/09/2016  . 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 Coronary artery calcification seen on CAT scan 05/15/2016  . Anxiety disorder 04/08/2016  . Insomnia 04/08/2016  . 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 Tobacco use disorder 04/08/2016  . Hypercholesterolemia 04/08/2016  . Malignant neoplasm of prostate ([place]) 02/23/2014     Follow-Up:  Pharmacist Review   Reviewed chart prior to disease state call. Spoke with patient regarding BP  Recent Office Vitals: BP Readings from Last 3 Encounters:  07/03/20 (!) 123/93  05/21/20 128/70  04/03/20 (!) 137/97   Pulse Readings from Last 3 Encounters:  07/03/20 88  05/21/20 81  04/03/20 92    Wt Readings from Last 3 Encounters:  07/03/20 228 lb 6.4 oz (103.6 kg)  05/21/20 228 lb (103.4 kg)  04/03/20 228 lb 11.2 oz (103.7 kg)     Kidney Function Lab Results  Component Value Date/Time   CREATININE 1.03 07/03/2020 01:02 PM   CREATININE 1.07 04/03/2020 07:25 AM   CREATININE 0.9 08/27/2017 09:15 AM   CREATININE 0.9 05/27/2017 07:34 AM   GFR 78.21 02/18/2018 09:01 AM   GFRNONAA >60 07/03/2020 01:02 PM   GFRAA >60 04/03/2020 07:25 AM    BMP Latest Ref Rng & Units 07/03/2020 04/03/2020 12/14/2019  Glucose 70 - 99 mg/dL 111(H) 115(H) 118(H)  BUN 8 - 23 mg/dL 18 16 18   Creatinine 0.61 - 1.24 mg/dL 1.03 1.07 1.00  BUN/Creat Ratio 10 - 24 - - -  Sodium 135 - 145 mmol/L 141 142 138  Potassium 3.5 - 5.1 mmol/L 4.8 4.6 4.5  Chloride 98 - 111 mmol/L 105 104 105  CO2 22 - 32 mmol/L 29 27 22   Calcium 8.9 - 10.3 mg/dL 9.3 10.0 9.2    . Current antihypertensive regimen:   Amlodipine 2.5 mg daily   Furosemide 20 mg daily PRN   Metoprolol tartrate 25 mg twice daily  . How often are you checking your Blood Pressure? Patient wife states he does not  check his blood pressure at home . Current home BP readings: None ID . What recent interventions/DTPs have been made by any provider to improve Blood Pressure control since last CPP Visit: None ID . Any recent  hospitalizations or ED visits since last visit with CPP? No . What diet changes have been made to improve Blood Pressure Control?  o Patient wife states she cook at home using salt for taste. - Meat+ vegetables+ Fruit . What exercise is being done to improve your Blood Pressure Control?  o Patient wife states patient tries to walk when he can.  Adherence Review: Is the patient currently on ACE/ARB medication? No Does the patient have >5 day gap between last estimated fill dates? Yes   [place] Pharmacist Assistant [phone]

## 2020-07-04 NOTE — Telephone Encounter (Signed)
Called patient, left a message to call the office. Patient's wife called and was giving results per Dr [name]. Patient's wife verbalized understanding and relay message to patient.

## 2020-07-04 NOTE — Telephone Encounter (Signed)
-----   Message from Wyatt Portela, MD sent at 07/04/2020 10:20 AM EDT ----- Please let him know his PSA is still low

## 2020-07-18 ENCOUNTER — Other Ambulatory Visit: Payer: Self-pay

## 2020-07-18 ENCOUNTER — Emergency Department (HOSPITAL_BASED_OUTPATIENT_CLINIC_OR_DEPARTMENT_OTHER): Payer: Medicare Other

## 2020-07-18 ENCOUNTER — Inpatient Hospital Stay (HOSPITAL_BASED_OUTPATIENT_CLINIC_OR_DEPARTMENT_OTHER)
Admission: EM | Admit: 2020-07-18 | Discharge: 2020-07-23 | DRG: 871 | Disposition: A | Discharge status: Home Health | Payer: Medicare Other | Attending: Internal Medicine | Admitting: Internal Medicine

## 2020-07-18 DIAGNOSIS — J9601 Acute respiratory failure with hypoxia: Secondary | ICD-10-CM | POA: Diagnosis present

## 2020-07-18 DIAGNOSIS — Z7982 Long term (current) use of aspirin: Secondary | ICD-10-CM

## 2020-07-18 DIAGNOSIS — Z87891 Personal history of nicotine dependence: Secondary | ICD-10-CM | POA: Diagnosis not present

## 2020-07-18 DIAGNOSIS — C61 Malignant neoplasm of prostate: Secondary | ICD-10-CM | POA: Diagnosis not present

## 2020-07-18 DIAGNOSIS — I495 Sick sinus syndrome: Secondary | ICD-10-CM | POA: Diagnosis present

## 2020-07-18 DIAGNOSIS — K219 Gastro-esophageal reflux disease without esophagitis: Secondary | ICD-10-CM | POA: Diagnosis not present

## 2020-07-18 DIAGNOSIS — I774 Celiac artery compression syndrome: Secondary | ICD-10-CM | POA: Diagnosis not present

## 2020-07-18 DIAGNOSIS — Z79899 Other long term (current) drug therapy: Secondary | ICD-10-CM | POA: Diagnosis not present

## 2020-07-18 DIAGNOSIS — R0602 Shortness of breath: Secondary | ICD-10-CM | POA: Diagnosis not present

## 2020-07-18 DIAGNOSIS — E1151 Type 2 diabetes mellitus with diabetic peripheral angiopathy without gangrene: Secondary | ICD-10-CM | POA: Diagnosis present

## 2020-07-18 DIAGNOSIS — I482 Chronic atrial fibrillation, unspecified: Secondary | ICD-10-CM | POA: Diagnosis not present

## 2020-07-18 DIAGNOSIS — R0789 Other chest pain: Secondary | ICD-10-CM | POA: Diagnosis not present

## 2020-07-18 DIAGNOSIS — Q211 Atrial septal defect: Secondary | ICD-10-CM | POA: Diagnosis not present

## 2020-07-18 DIAGNOSIS — A4151 Sepsis due to Escherichia coli [E. coli]: Secondary | ICD-10-CM | POA: Diagnosis present

## 2020-07-18 DIAGNOSIS — E1165 Type 2 diabetes mellitus with hyperglycemia: Secondary | ICD-10-CM | POA: Diagnosis not present

## 2020-07-18 DIAGNOSIS — Z9049 Acquired absence of other specified parts of digestive tract: Secondary | ICD-10-CM | POA: Diagnosis not present

## 2020-07-18 DIAGNOSIS — Z923 Personal history of irradiation: Secondary | ICD-10-CM

## 2020-07-18 DIAGNOSIS — K5900 Constipation, unspecified: Secondary | ICD-10-CM | POA: Diagnosis not present

## 2020-07-18 DIAGNOSIS — R079 Chest pain, unspecified: Secondary | ICD-10-CM | POA: Diagnosis not present

## 2020-07-18 DIAGNOSIS — I2781 Cor pulmonale (chronic): Secondary | ICD-10-CM | POA: Diagnosis present

## 2020-07-18 DIAGNOSIS — G4733 Obstructive sleep apnea (adult) (pediatric): Secondary | ICD-10-CM | POA: Diagnosis present

## 2020-07-18 DIAGNOSIS — R600 Localized edema: Secondary | ICD-10-CM

## 2020-07-18 DIAGNOSIS — E876 Hypokalemia: Secondary | ICD-10-CM | POA: Diagnosis not present

## 2020-07-18 DIAGNOSIS — I714 Abdominal aortic aneurysm, without rupture, unspecified: Secondary | ICD-10-CM

## 2020-07-18 DIAGNOSIS — I712 Thoracic aortic aneurysm, without rupture: Secondary | ICD-10-CM | POA: Diagnosis not present

## 2020-07-18 DIAGNOSIS — R7881 Bacteremia: Secondary | ICD-10-CM | POA: Diagnosis not present

## 2020-07-18 DIAGNOSIS — T380X5A Adverse effect of glucocorticoids and synthetic analogues, initial encounter: Secondary | ICD-10-CM | POA: Diagnosis present

## 2020-07-18 DIAGNOSIS — I5033 Acute on chronic diastolic (congestive) heart failure: Secondary | ICD-10-CM | POA: Diagnosis not present

## 2020-07-18 DIAGNOSIS — E872 Acidosis: Secondary | ICD-10-CM | POA: Diagnosis not present

## 2020-07-18 DIAGNOSIS — N179 Acute kidney failure, unspecified: Secondary | ICD-10-CM | POA: Diagnosis not present

## 2020-07-18 DIAGNOSIS — J432 Centrilobular emphysema: Secondary | ICD-10-CM | POA: Diagnosis present

## 2020-07-18 DIAGNOSIS — J9621 Acute and chronic respiratory failure with hypoxia: Secondary | ICD-10-CM | POA: Diagnosis present

## 2020-07-18 DIAGNOSIS — I110 Hypertensive heart disease with heart failure: Secondary | ICD-10-CM | POA: Diagnosis present

## 2020-07-18 DIAGNOSIS — R0902 Hypoxemia: Secondary | ICD-10-CM | POA: Diagnosis not present

## 2020-07-18 DIAGNOSIS — G9341 Metabolic encephalopathy: Secondary | ICD-10-CM | POA: Diagnosis not present

## 2020-07-18 DIAGNOSIS — Z7951 Long term (current) use of inhaled steroids: Secondary | ICD-10-CM

## 2020-07-18 DIAGNOSIS — Z950 Presence of cardiac pacemaker: Secondary | ICD-10-CM | POA: Diagnosis not present

## 2020-07-18 DIAGNOSIS — R6520 Severe sepsis without septic shock: Secondary | ICD-10-CM | POA: Diagnosis not present

## 2020-07-18 DIAGNOSIS — J439 Emphysema, unspecified: Secondary | ICD-10-CM | POA: Diagnosis not present

## 2020-07-18 DIAGNOSIS — N281 Cyst of kidney, acquired: Secondary | ICD-10-CM | POA: Diagnosis not present

## 2020-07-18 DIAGNOSIS — Z20822 Contact with and (suspected) exposure to covid-19: Secondary | ICD-10-CM | POA: Diagnosis present

## 2020-07-18 DIAGNOSIS — B9620 Unspecified Escherichia coli [E. coli] as the cause of diseases classified elsewhere: Secondary | ICD-10-CM | POA: Diagnosis not present

## 2020-07-18 LAB — HEPATIC FUNCTION PANEL
ALT: 317 U/L — ABNORMAL HIGH (ref 0–44)
AST: 883 U/L — ABNORMAL HIGH (ref 15–41)
Albumin: 4.1 g/dL (ref 3.5–5.0)
Alkaline Phosphatase: 101 U/L (ref 38–126)
Bilirubin, Direct: 1.4 mg/dL — ABNORMAL HIGH (ref 0.0–0.2)
Indirect Bilirubin: 0.8 mg/dL (ref 0.3–0.9)
Total Bilirubin: 2.2 mg/dL — ABNORMAL HIGH (ref 0.3–1.2)
Total Protein: 7.3 g/dL (ref 6.5–8.1)

## 2020-07-18 LAB — BASIC METABOLIC PANEL
Anion gap: 13 (ref 5–15)
BUN: 18 mg/dL (ref 8–23)
CO2: 25 mmol/L (ref 22–32)
Calcium: 9.2 mg/dL (ref 8.9–10.3)
Chloride: 102 mmol/L (ref 98–111)
Creatinine, Ser: 0.91 mg/dL (ref 0.61–1.24)
GFR, Estimated: >60 mL/min (ref >60)
Glucose, Bld: 127 mg/dL — ABNORMAL HIGH (ref 70–99)
Potassium: 3.6 mmol/L (ref 3.5–5.1)
Sodium: 140 mmol/L (ref 135–145)

## 2020-07-18 LAB — TROPONIN I (HIGH SENSITIVITY): Troponin I (High Sensitivity): 6 ng/L (ref <18)

## 2020-07-18 LAB — CBC
HCT: 44.3 % (ref 39.0–52.0)
Hemoglobin: 14.4 g/dL (ref 13.0–17.0)
MCH: 31 pg (ref 26.0–34.0)
MCHC: 32.5 g/dL (ref 30.0–36.0)
MCV: 95.5 fL (ref 80.0–100.0)
Platelets: 172 10*3/uL (ref 150–400)
RBC: 4.64 MIL/uL (ref 4.22–5.81)
RDW: 13.8 % (ref 11.5–15.5)
WBC: 5.6 10*3/uL (ref 4.0–10.5)
nRBC: 0 % (ref 0.0–0.2)

## 2020-07-18 LAB — BRAIN NATRIURETIC PEPTIDE: B Natriuretic Peptide: 130.6 pg/mL — ABNORMAL HIGH (ref 0.0–100.0)

## 2020-07-18 LAB — LIPASE, BLOOD: Lipase: 21 U/L (ref 11–51)

## 2020-07-18 MED ORDER — ONDANSETRON HCL 4 MG/2ML IJ SOLN
INTRAMUSCULAR | Status: AC
  Administered 2020-07-18: 4 mg via INTRAVENOUS
  Filled 2020-07-18: qty 2

## 2020-07-18 MED ORDER — ONDANSETRON HCL 4 MG/2ML IJ SOLN: 4.0000 mg | Freq: Once | INTRAMUSCULAR | Status: AC | PRN

## 2020-07-18 MED ORDER — IOHEXOL 350 MG/ML SOLN
100.0000 mL | Freq: Once | INTRAVENOUS | Status: AC | PRN
  Administered 2020-07-18: 100 mL via INTRAVENOUS

## 2020-07-18 NOTE — ED Notes (Signed)
Patient transported to CT 

## 2020-07-18 NOTE — ED Provider Notes (Signed)
[facility] EMERGENCY DEPARTMENT Provider Note   CSN: [id number] Arrival date & time: 07/18/20  2203     History Chief Complaint  Patient presents with  . Chest Pain    Marcello Tuzzolino Faraj is a 79 y.o. male with past medical history of prostate cancer, GERD, obesity, prediabetes, AAA, who presents today for evaluation of sudden onset of chest and abdominal pain.  His symptoms started shortly prior to arrival while he and his wife were watching her grandchildren.  Patient reports increasing pain in his legs and in his chest with shortness of breath.  He was well prior to this starting.  No clear trigger for his symptoms.  His wife reports he is compliant with his medications.  Level 5 caveat for acuity of condition, difficulty breathing.  HPI     Past Medical History:  Diagnosis Date  . Gastric outlet obstruction 02/03/2018   [name] 02/03/2018  . GERD (gastroesophageal reflux disease)   . History of hiatal hernia    small/notes 02/03/2018  . Hypertension    no meds  . Presence of permanent cardiac pacemaker   . Prostate cancer ([place]) 02/08/14   Gleason 4+5=9, PSA 15.65  . Radiation   . Sinus problem   . SSS (sick sinus syndrome) [facility])     Patient Active Problem List   Diagnosis Date Noted  . 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 Morbid obesity ([place]) 11/14/2019  . Prediabetes 11/14/2019  . Varicose veins of both lower extremities 05/17/2019  . Bilateral lower extremity edema 03/13/2019  . Hypokalemia 03/13/2019  . 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 SSS (sick sinus syndrome) ([place]) 06/14/2018  . 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 Coronary artery disease involving native coronary artery of native heart without angina pectoris 06/08/2018  . Sick sinus syndrome ([place]) 06/08/2018  . Aneurysm of thoracic aorta ([place]) 02/03/2018  . 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 Emphysema lung ([place]) 02/03/2018  . 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 Acute appendicitis 01/21/2018  .  Perforated appendicitis 01/21/2018  . Chronic fatigue 06/01/2017  . Essential hypertension, benign 07/09/2016  . 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[place]) 07/06/2016  . Coronary artery calcification seen on CAT scan 05/15/2016  . Anxiety disorder 04/08/2016  . Insomnia 04/08/2016  . 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 Tobacco use disorder 04/08/2016  . Hypercholesterolemia 04/08/2016  . Malignant neoplasm of prostate ([place]) 02/23/2014    Past Surgical History:  Procedure Laterality Date  . APPENDECTOMY  2019   hx hematoma s/p appendectomy  . ESOPHAGOGASTRODUODENOSCOPY (EGD) WITH PROPOFOL N/A 02/03/2018   Procedure: ESOPHAGOGASTRODUODENOSCOPY (EGD) WITH PROPOFOL;  Surgeon: Clarene Essex, MD;  Location: [facility];  Service: Endoscopy;  Laterality: N/A;  . FRACTURE SURGERY    . INSERT / REPLACE / REMOVE PACEMAKER  06/14/2018  . LAPAROSCOPIC APPENDECTOMY N/A 01/21/2018   Procedure: APPENDECTOMY LAPAROSCOPIC;  Surgeon: Kinsinger, Arta Bruce, MD;  Location: [facility];  Service: General;  Laterality: N/A;  . PACEMAKER IMPLANT N/A 06/14/2018   Procedure: PACEMAKER IMPLANT - Dual Chamber;  Surgeon: Sanda Klein, MD;  Location: [place] CV LAB;  Service: Cardiovascular;  Laterality: N/A;  . PROSTATE BIOPSY  02/08/14   gleason 4+5=9, 12/12 cores positive, 54 gm  . RADIOACTIVE SEED IMPLANT N/A 06/29/2014   Procedure: RADIOACTIVE SEED IMPLANT;  Surgeon: Bernestine Amass, MD;  Location: [facility];  Service: Urology;  Laterality: N/A;  . WRIST FRACTURE SURGERY Right 1980s       Family History  Problem Relation Age of Onset  . Alzheimer's disease Mother   . Alzheimer's disease Father   . Cancer Father        prostate  . Arthritis Sister   . Cancer Brother        prostate    Social History   Tobacco Use  . Smoking status: Former Smoker    Years: 60.00    Types: Cigars    Quit date: 09/14/2012    Years since quitting: 7.8    . Smokeless tobacco: Never Used  . Tobacco comment: little cigars; the small ones"  Vaping Use  . Vaping Use: Never used  Substance Use Topics  . Alcohol use: Yes    Alcohol/week: 14.0 standard drinks    Types: 14 Cans of beer per week  . Drug use: No    Home Medications Prior to Admission medications   Medication Sig Start Date End Date Taking? Authorizing Provider  abiraterone acetate (ZYTIGA) 250 MG tablet TAKE 4 TABLETS (1,000 MG TOTAL) BY MOUTH DAILY. TAKE ON AN EMPTY STOMACH 1 HOUR BEFORE OR 2 HOURS AFTER A MEAL 04/23/20   Wyatt Portela, MD  acetaminophen (TYLENOL) 500 MG tablet Take 500 mg by mouth every 6 (six) hours as needed (for pain).    [provider]  albuterol (VENTOLIN HFA) 108 (90 Base) MCG/ACT inhaler Inhale 2 puffs into the lungs every 6 (six) hours as needed for wheezing or shortness of breath. 04/17/20   [name], [name], MD  amLODipine (NORVASC) 2.5 MG tablet TAKE 1 TABLET BY MOUTH EVERY DAY 06/03/20   [name], [name], MD  aspirin EC 81 MG tablet Take 81 mg by mouth daily.    [provider]  atorvastatin (LIPITOR) 20 MG tablet TAKE 1 TABLET BY MOUTH EVERY DAY 12/12/19   Croitoru, Mihai, MD  carbamide peroxide (DEBROX) 6.5 % OTIC solution Place 5 drops into both ears daily as needed (for earwax).    [provider]  citalopram (CELEXA) 10 MG tablet TAKE 1 TABLET BY MOUTH EVERY DAY 03/04/20   [name], [name], MD  diclofenac sodium (VOLTAREN) 1 % GEL Apply 4 g topically 4 (four) times daily. 02/13/19   [name], [name], MD  fluticasone (FLONASE) 50 MCG/ACT nasal spray PLACE 1 SPRAY INTO BOTH NOSTRILS DAILY AS NEEDED FOR ALLERGIES OR RHINITIS (AT BEDTIME). 09/04/19   [name], [name], MD  Fluticasone-Salmeterol (ADVAIR) 100-50 MCG/DOSE AEPB Inhale 1 puff into the lungs 2 (two) times daily. 11/14/19   [name], [name], MD  furosemide (LASIX) 20 MG tablet TAKE 1 TABLET BY MOUITH DAILY FOR 5 DAYS , THEN DAILY AS NEEDED FOR EDEMA 04/02/20   [name], [name], MD   LORazepam (ATIVAN) 1 MG tablet TAKE 1 TABLET (1 MG TOTAL) BY MOUTH 2 (TWO) TIMES DAILY AS NEEDED. 05/21/20   [name], [name], MD  metoprolol tartrate (LOPRESSOR) 25 MG tablet TAKE 1 TABLET BY MOUTH TWICE A DAY 03/07/20   Croitoru, Mihai, MD  Multiple Vitamin (MULTIVITAMIN WITH MINERALS) TABS tablet Take 0.5 tablets by mouth every other day. Centrum    [provider]  omeprazole (PRILOSEC) 20 MG capsule TAKE 1 CAPSULE (20 MG TOTAL) BY MOUTH DAILY BEFORE BREAKFAST. 06/03/20   [name], [name], MD  polyethylene glycol powder (GLYCOLAX/MIRALAX) powder Take 17 g by mouth daily as needed for moderate constipation.     [provider]  potassium chloride SA (KLOR-CON M20) 20 MEQ tablet Take 1 tablet (20 mEq total) by mouth 2 (  two) times daily. 08/28/19   [name], [name], MD  predniSONE (DELTASONE) 5 MG tablet TAKE 1 TABLET BY MOUTH EVERY DAY WITH BREAKFAST 06/03/20   Wyatt Portela, MD  senna (SENOKOT) 8.6 MG TABS tablet Take 2 tablets by mouth daily as needed for mild constipation.    [provider]    Allergies    Penicillins  Review of Systems   Review of Systems  Unable to perform ROS: Acuity of condition    Physical Exam Updated Vital Signs BP 116/72   Pulse 85   Temp 98.7 F (37.1 C) (Oral)   Resp (!) 24   Ht 5\' 9"  (1.753 m)   Wt 104 kg   SpO2 97%   BMI 33.86 kg/m   Physical Exam Vitals and nursing note reviewed.  Constitutional:      General: He is in acute distress.     Appearance: He is well-developed. He is obese. He is ill-appearing and diaphoretic.     Comments: Answers questions, sitting up right, short of breath, has vomited.   HENT:     Head: Normocephalic and atraumatic.  Eyes:     General: No scleral icterus.       Right eye: No discharge.        Left eye: No discharge.     Conjunctiva/sclera: Conjunctivae normal.  Cardiovascular:     Rate and Rhythm: Normal rate and regular rhythm.     Heart sounds: Normal heart sounds.     Comments:  Unable to palpate DP/PT pulses bilaterally.   Pulmonary:     Effort: Accessory muscle usage and respiratory distress present.     Breath sounds: No stridor. Examination of the right-upper field reveals wheezing. Examination of the left-upper field reveals wheezing. Examination of the right-middle field reveals wheezing. Examination of the left-middle field reveals wheezing. Examination of the right-lower field reveals wheezing. Examination of the left-lower field reveals wheezing. Wheezing present. No decreased breath sounds or rhonchi.  Chest:     Chest wall: No tenderness.  Abdominal:     General: There is no distension.     Palpations: Abdomen is soft.     Tenderness: There is no abdominal tenderness.  Musculoskeletal:        General: No deformity.     Cervical back: Normal range of motion.     Right lower leg: No tenderness. No edema.     Left lower leg: No tenderness. No edema.  Skin:    General: Skin is warm.  Neurological:     General: No focal deficit present.     Mental Status: He is alert and oriented to person, place, and time.     Motor: No weakness or abnormal muscle tone.  Psychiatric:        Mood and Affect: Mood normal.        Behavior: Behavior normal.     ED Results / Procedures / Treatments   Labs (all labs ordered are listed, but only abnormal results are displayed) Labs Reviewed  BASIC METABOLIC PANEL - Abnormal; Notable for the following components:      Result Value   Glucose, Bld 127 (*)    All other components within normal limits  BRAIN NATRIURETIC PEPTIDE - Abnormal; Notable for the following components:   B Natriuretic Peptide 130.6 (*)    All other components within normal limits  HEPATIC FUNCTION PANEL - Abnormal; Notable for the following components:   AST 883 (*)    ALT 317 (*)  Total Bilirubin 2.2 (*)    Bilirubin, Direct 1.4 (*)    All other components within normal limits  RESPIRATORY PANEL BY RT PCR (FLU A&B, COVID)  CBC  LIPASE,  BLOOD  LACTIC ACID, PLASMA  LACTIC ACID, PLASMA  ETHANOL  TROPONIN I (HIGH SENSITIVITY)  TROPONIN I (HIGH SENSITIVITY)    EKG EKG Interpretation  Date/Time:  Thursday July 18 2020 22:12:56 EDT Ventricular Rate:  88 PR Interval:    QRS Duration: 156 QT Interval:  417 QTC Calculation: 505 R Axis:   -16 Text Interpretation: Sinus or ectopic atrial rhythm Right bundle branch block No significant change since 06/15/2018 Confirmed by Veryl Speak [phone]) on 07/18/2020 11:05:17 PM   Radiology DG Chest Port 1 View  Result Date: 07/18/2020 CLINICAL DATA:  Shortness of breath EXAM: PORTABLE CHEST 1 VIEW COMPARISON:  August 19, 2018 FINDINGS: The heart size and mediastinal contours are mildly enlarged. Aortic knob calcifications are seen. A left-sided pacemaker seen with the lead tips in the right atrium right ventricle. Both lungs are clear. The visualized skeletal structures are unremarkable. IMPRESSION: No active disease. Electronically Signed   By: Prudencio Pair M.D.   On: 07/18/2020 22:53   CT Angio Chest/Abd/Pel for Dissection W and/or Wo Contrast  Result Date: 07/18/2020 CLINICAL DATA:  Chest and abdominal pain EXAM: CT ANGIOGRAPHY CHEST, ABDOMEN AND PELVIS TECHNIQUE: Non-contrast CT of the chest was initially obtained. Multidetector CT imaging through the chest, abdomen and pelvis was performed using the standard protocol during bolus administration of intravenous contrast. Multiplanar reconstructed images and MIPs were obtained and reviewed to evaluate the vascular anatomy. CONTRAST:  150mL OMNIPAQUE 350 COMPARISON:  Chest x-ray from earlier in the same day, CT from 01/19/2020 FINDINGS: CTA CHEST FINDINGS Cardiovascular: Precontrast images demonstrate heavy atherosclerotic calcifications. No hyperdense crescent to suggest acute aortic abnormality is seen. The thoracic aorta demonstrates normal opacification. Dilatation of the ascending aorta to 4.6 cm is again identified and stable. No  findings to suggest dissection are seen. Normal tapering in the thoracic aortic arch is noted. Descending thoracic aorta shows no aneurysmal dilatation. Coronary calcifications are seen. No cardiac enlargement is noted. Pacing wires are again seen. Mediastinum/Nodes: Thoracic inlet is within normal limits. No sizable hilar or mediastinal adenopathy is noted. The esophagus as visualized is within normal limits. Lungs/Pleura: Diffuse emphysematous changes are identified. Scattered fibrotic changes are seen as well as mild bibasilar atelectasis. No sizable parenchymal nodules are seen. Musculoskeletal: Degenerative changes of the thoracic spine are noted. No compression deformities are noted. No acute fracture is seen. Review of the MIP images confirms the above findings. CTA ABDOMEN AND PELVIS FINDINGS VASCULAR Aorta: Abdominal aorta demonstrates atherosclerotic calcifications as well as mural thrombus particularly in the aneurysmal portion in the infrarenal aorta. Maximum dimension of the infrarenal aorta is 3.8 cm. This is stable from the prior exam. No dissection is seen. Celiac: High-grade stenosis of the celiac axis is noted similar to that seen on the prior exam. SMA: Calcified plaque is noted without focal stenosis. Renals: Single renal arteries are identified bilaterally with atherosclerotic calcifications. No focal stenosis is seen. IMA: Origin of the IMA is occluded although there is reconstitution via branches of the SMA. [place]liacs: Atherosclerotic calcifications are noted without aneurysmal dilatation. No dissection is seen. Veins: No specific venous abnormality is noted. Review of the MIP images confirms the above findings. NON-VASCULAR Hepatobiliary: No focal liver abnormality is seen. No gallstones, gallbladder wall thickening, or biliary dilatation. Pancreas: Diffuse fatty replacement of the pancreas  is noted. Spleen: Normal in size without focal abnormality. Adrenals/Urinary Tract: Adrenal glands are  within normal limits. Kidneys demonstrate a normal enhancement pattern. Stable renal cysts are noted on the left. Scarring with calcification is noted on the right stable in appearance. No calculi or obstructive changes are seen. The bladder is partially distended. Stomach/Bowel: Scattered diverticular change of the colon is noted without evidence of diverticulitis. The appendix is not visualized consistent with the prior surgical history. No small bowel abnormality is seen. The stomach is within normal limits. Lymphatic: No significant lymphadenopathy is seen. Reproductive: Prostate brachytherapy seeds are noted. Other: Fat containing inguinal hernias are noted bilaterally right greater than left. Musculoskeletal: Degenerative changes of lumbar spine are seen. Review of the MIP images confirms the above findings. 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 Ascending thoracic aortic aneurysm. Recommend semi-annual imaging followup by CTA or MRA and referral to cardiothoracic surgery if not already obtained. This recommendation follows 2010 ACCF/AHA/AATS/ACR/ASA/SCA/SCAI/SIR/STS/SVM Guidelines for the Diagnosis and Management of Patients With Thoracic Aortic Disease. Circulation. 2010; 121: N629-B284. Aortic aneurysm NOS (ICD10-I71.9) 3.8 cm infrarenal abdominal aortic aneurysm also stable from the prior study. Recommend follow-up ultrasound every 2 years. This recommendation follows ACR consensus guidelines: White Paper of the ACR Incidental Findings Committee II on Vascular Findings. J Am Coll Radiol 2013; 10:789-794. 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 Diverticular change without diverticulitis. No acute abnormality noted. Aortic Atherosclerosis (ICD10-I70.0) and Emphysema (ICD10-J43.9). Electronically Signed   By: Inez Catalina M.D.   On: 07/18/2020 23:28    Procedures .Critical  Care Performed by: Lorin Glass, PA-C Authorized by: Lorin Glass, PA-C   Critical care provider statement:    Critical care time (minutes):  45   Critical care time was exclusive of:  Separately billable procedures and treating other patients and teaching time   Critical care was necessary to treat or prevent imminent or life-threatening deterioration of the following conditions:  Respiratory failure   Critical care was time spent personally by me on the following activities:  Discussions with consultants, evaluation of patient's response to treatment, examination of patient, ordering and performing treatments and interventions, ordering and review of laboratory studies, ordering and review of radiographic studies, pulse oximetry, re-evaluation of patient's condition, obtaining history from patient or surrogate and review of old charts   (including critical care time)  Medications Ordered in ED Medications  albuterol (VENTOLIN HFA) 108 (90 Base) MCG/ACT inhaler 8 puff (has no administration in time range)  ondansetron (ZOFRAN) injection 4 mg (4 mg Intravenous Given 07/18/20 2235)  iohexol (OMNIPAQUE) 350 MG/ML injection 100 mL (100 mLs Intravenous Contrast Given 07/18/20 2313)    ED Course  I have reviewed the triage vital signs and the nursing notes.  Pertinent labs & imaging results that were available during my care of the patient were reviewed by me and considered in my medical decision making (see chart for details).  Clinical Course as of Jul 19 13  Thu Jul 18, 2020  2315 CT Angio Chest/Abd/Pel for Dissection W and/or Wo Contrast [EH]    Clinical Course User Index [EH] Ollen Gross   MDM Rules/Calculators/A&P                         Patient is a 79 year old man who presents today for evaluation of sudden onset chest pain  and shortness of breath.  He has a history of AAA and with the sudden onset of his symptoms concern for dissection versus PE.   Chest x-ray is obtained without significant acute abnormality.  CT dissection scan is obtained without acute dissection, proximal PE, or other clear cause for his symptoms.  He has multiple abnormalities but these appear chronic. He is on 15 L nonrebreather.  RN attempted to wean him down however he desaturated.    Labs are obtained, he does appear to have a new significant transaminitis with elevated total bili.  His CBC is unremarkable.  BMP is normal BNP is slightly elevated at 130.  Initial troponin is negative.  Lipase is normal.  Patient will be given albuterol, Steroids.   Reza Crymes Gouin was evaluated in Emergency Department on 07/19/2020 for the symptoms described in the history of present illness. He was evaluated in the context of the global COVID-19 pandemic, which necessitated consideration that the patient might be at risk for infection with the SARS-CoV-2 virus that causes COVID-19. Institutional protocols and algorithms that pertain to the evaluation of patients at risk for COVID-19 are in a state of rapid change based on information released by regulatory bodies including the CDC and federal and state organizations. These policies and algorithms were followed during the patient's care in the ED.  Note: Portions of this report may have been transcribed using voice recognition software. Every effort was made to ensure accuracy; however, inadvertent computerized transcription errors may be present  At shift change care was transferred to Dr. Stark Jock who will follow pending studies, re-evaulate and determine disposition.     Final Clinical Impression(s) / ED Diagnoses Final diagnoses:  Atypical chest pain  Chest pain, unspecified type  AAA (abdominal aortic aneurysm) without rupture [facility])  Hypoxia    Rx / DC Orders ED Discharge Orders    None       Lorin Glass, PA-C 07/19/20 0030    Veryl Speak, MD 07/19/20 [phone]

## 2020-07-19 ENCOUNTER — Inpatient Hospital Stay (HOSPITAL_COMMUNITY): Payer: Medicare Other

## 2020-07-19 ENCOUNTER — Encounter (HOSPITAL_COMMUNITY): Payer: Self-pay | Admitting: Internal Medicine

## 2020-07-19 DIAGNOSIS — Q211 Atrial septal defect: Secondary | ICD-10-CM | POA: Diagnosis not present

## 2020-07-19 DIAGNOSIS — R079 Chest pain, unspecified: Secondary | ICD-10-CM | POA: Diagnosis not present

## 2020-07-19 DIAGNOSIS — I2781 Cor pulmonale (chronic): Secondary | ICD-10-CM | POA: Diagnosis present

## 2020-07-19 DIAGNOSIS — I482 Chronic atrial fibrillation, unspecified: Secondary | ICD-10-CM | POA: Diagnosis present

## 2020-07-19 DIAGNOSIS — C61 Malignant neoplasm of prostate: Secondary | ICD-10-CM | POA: Diagnosis not present

## 2020-07-19 DIAGNOSIS — Z7951 Long term (current) use of inhaled steroids: Secondary | ICD-10-CM | POA: Diagnosis not present

## 2020-07-19 DIAGNOSIS — A4151 Sepsis due to Escherichia coli [E. coli]: Secondary | ICD-10-CM | POA: Diagnosis present

## 2020-07-19 DIAGNOSIS — R0789 Other chest pain: Secondary | ICD-10-CM | POA: Diagnosis present

## 2020-07-19 DIAGNOSIS — R600 Localized edema: Secondary | ICD-10-CM | POA: Diagnosis not present

## 2020-07-19 DIAGNOSIS — Z950 Presence of cardiac pacemaker: Secondary | ICD-10-CM | POA: Diagnosis not present

## 2020-07-19 DIAGNOSIS — N179 Acute kidney failure, unspecified: Secondary | ICD-10-CM | POA: Diagnosis present

## 2020-07-19 DIAGNOSIS — Z79899 Other long term (current) drug therapy: Secondary | ICD-10-CM | POA: Diagnosis not present

## 2020-07-19 DIAGNOSIS — Z87891 Personal history of nicotine dependence: Secondary | ICD-10-CM | POA: Diagnosis not present

## 2020-07-19 DIAGNOSIS — R0902 Hypoxemia: Secondary | ICD-10-CM | POA: Diagnosis not present

## 2020-07-19 DIAGNOSIS — E872 Acidosis: Secondary | ICD-10-CM | POA: Diagnosis present

## 2020-07-19 DIAGNOSIS — E1165 Type 2 diabetes mellitus with hyperglycemia: Secondary | ICD-10-CM | POA: Diagnosis present

## 2020-07-19 DIAGNOSIS — Z923 Personal history of irradiation: Secondary | ICD-10-CM | POA: Diagnosis not present

## 2020-07-19 DIAGNOSIS — I714 Abdominal aortic aneurysm, without rupture: Secondary | ICD-10-CM | POA: Diagnosis not present

## 2020-07-19 DIAGNOSIS — E876 Hypokalemia: Secondary | ICD-10-CM | POA: Diagnosis present

## 2020-07-19 DIAGNOSIS — Z9049 Acquired absence of other specified parts of digestive tract: Secondary | ICD-10-CM | POA: Diagnosis not present

## 2020-07-19 DIAGNOSIS — G9341 Metabolic encephalopathy: Secondary | ICD-10-CM | POA: Diagnosis present

## 2020-07-19 DIAGNOSIS — E1151 Type 2 diabetes mellitus with diabetic peripheral angiopathy without gangrene: Secondary | ICD-10-CM | POA: Diagnosis present

## 2020-07-19 DIAGNOSIS — K5900 Constipation, unspecified: Secondary | ICD-10-CM | POA: Diagnosis present

## 2020-07-19 DIAGNOSIS — I5033 Acute on chronic diastolic (congestive) heart failure: Secondary | ICD-10-CM | POA: Diagnosis present

## 2020-07-19 DIAGNOSIS — J9621 Acute and chronic respiratory failure with hypoxia: Secondary | ICD-10-CM | POA: Diagnosis present

## 2020-07-19 DIAGNOSIS — R6520 Severe sepsis without septic shock: Secondary | ICD-10-CM | POA: Diagnosis present

## 2020-07-19 DIAGNOSIS — J9601 Acute respiratory failure with hypoxia: Secondary | ICD-10-CM | POA: Diagnosis not present

## 2020-07-19 DIAGNOSIS — Z7982 Long term (current) use of aspirin: Secondary | ICD-10-CM | POA: Diagnosis not present

## 2020-07-19 DIAGNOSIS — I495 Sick sinus syndrome: Secondary | ICD-10-CM | POA: Diagnosis present

## 2020-07-19 DIAGNOSIS — K219 Gastro-esophageal reflux disease without esophagitis: Secondary | ICD-10-CM | POA: Diagnosis present

## 2020-07-19 DIAGNOSIS — Z20822 Contact with and (suspected) exposure to covid-19: Secondary | ICD-10-CM | POA: Diagnosis present

## 2020-07-19 LAB — PROTIME-INR
INR: 1.1 (ref 0.8–1.2)
Prothrombin Time: 14.2 seconds (ref 11.4–15.2)

## 2020-07-19 LAB — CBC
HCT: 39.4 % (ref 39.0–52.0)
Hemoglobin: 12.5 g/dL — ABNORMAL LOW (ref 13.0–17.0)
MCH: 31.2 pg (ref 26.0–34.0)
MCHC: 31.7 g/dL (ref 30.0–36.0)
MCV: 98.3 fL (ref 80.0–100.0)
Platelets: 139 10*3/uL — ABNORMAL LOW (ref 150–400)
RBC: 4.01 MIL/uL — ABNORMAL LOW (ref 4.22–5.81)
RDW: 13.9 % (ref 11.5–15.5)
WBC: 9.8 10*3/uL (ref 4.0–10.5)
nRBC: 0 % (ref 0.0–0.2)

## 2020-07-19 LAB — BLOOD CULTURE ID PANEL (REFLEXED) - BCID2

## 2020-07-19 LAB — SEDIMENTATION RATE: Sed Rate: 8 mm/hr (ref 0–16)

## 2020-07-19 LAB — BASIC METABOLIC PANEL
Anion gap: 17 — ABNORMAL HIGH (ref 5–15)
BUN: 19 mg/dL (ref 8–23)
CO2: 20 mmol/L — ABNORMAL LOW (ref 22–32)
Calcium: 8.5 mg/dL — ABNORMAL LOW (ref 8.9–10.3)
Chloride: 98 mmol/L (ref 98–111)
Creatinine, Ser: 1.32 mg/dL — ABNORMAL HIGH (ref 0.61–1.24)
GFR, Estimated: 56 mL/min — ABNORMAL LOW (ref >60)
Glucose, Bld: 306 mg/dL — ABNORMAL HIGH (ref 70–99)
Potassium: 3.4 mmol/L — ABNORMAL LOW (ref 3.5–5.1)
Sodium: 135 mmol/L (ref 135–145)

## 2020-07-19 LAB — I-STAT ARTERIAL BLOOD GAS, ED
Acid-Base Excess: 0 mmol/L (ref 0.0–2.0)
Bicarbonate: 24.2 mmol/L (ref 20.0–28.0)
Calcium, Ion: 1.17 mmol/L (ref 1.15–1.40)
HCT: 41 % (ref 39.0–52.0)
Hemoglobin: 13.9 g/dL (ref 13.0–17.0)
O2 Saturation: 85 %
Patient temperature: 98.7
Potassium: 3.1 mmol/L — ABNORMAL LOW (ref 3.5–5.1)
Sodium: 139 mmol/L (ref 135–145)
TCO2: 25 mmol/L (ref 22–32)
pCO2 arterial: 39.1 mmHg (ref 32.0–48.0)
pH, Arterial: 7.4 (ref 7.350–7.450)
pO2, Arterial: 50 mmHg — ABNORMAL LOW (ref 83.0–108.0)

## 2020-07-19 LAB — LACTIC ACID, PLASMA
Lactic Acid, Venous: 3.2 mmol/L (ref 0.5–1.9)
Lactic Acid, Venous: 3.4 mmol/L (ref 0.5–1.9)

## 2020-07-19 LAB — HEPATIC FUNCTION PANEL
ALT: 665 U/L — ABNORMAL HIGH (ref 0–44)
AST: 1217 U/L — ABNORMAL HIGH (ref 15–41)
Albumin: 3.5 g/dL (ref 3.5–5.0)
Alkaline Phosphatase: 104 U/L (ref 38–126)
Bilirubin, Direct: 2.2 mg/dL — ABNORMAL HIGH (ref 0.0–0.2)
Indirect Bilirubin: 1 mg/dL — ABNORMAL HIGH (ref 0.3–0.9)
Total Bilirubin: 3.2 mg/dL — ABNORMAL HIGH (ref 0.3–1.2)
Total Protein: 6.2 g/dL — ABNORMAL LOW (ref 6.5–8.1)

## 2020-07-19 LAB — HEMOGLOBIN A1C
Hgb A1c MFr Bld: 5.9 % — ABNORMAL HIGH (ref 4.8–5.6)
Mean Plasma Glucose: 122.63 mg/dL

## 2020-07-19 LAB — RESPIRATORY PANEL BY RT PCR (FLU A&B, COVID)
Influenza A by PCR: NEGATIVE
Influenza B by PCR: NEGATIVE
SARS Coronavirus 2 by RT PCR: NEGATIVE

## 2020-07-19 LAB — CK: Total CK: 57 U/L (ref 49–397)

## 2020-07-19 LAB — GLUCOSE, CAPILLARY
Glucose-Capillary: 297 mg/dL — ABNORMAL HIGH (ref 70–99)
Glucose-Capillary: 244 mg/dL — ABNORMAL HIGH (ref 70–99)
Glucose-Capillary: 154 mg/dL — ABNORMAL HIGH (ref 70–99)
Glucose-Capillary: 161 mg/dL — ABNORMAL HIGH (ref 70–99)

## 2020-07-19 LAB — ECHOCARDIOGRAM COMPLETE BUBBLE STUDY
Area-P 1/2: 3.93 cm2
Height: 69 in
S' Lateral: 3.1 cm
Weight: 3668.45 oz

## 2020-07-19 LAB — MRSA PCR SCREENING: MRSA by PCR: NEGATIVE

## 2020-07-19 LAB — AMMONIA: Ammonia: 25 umol/L (ref 9–35)

## 2020-07-19 LAB — PROCALCITONIN: Procalcitonin: 21.7 ng/mL

## 2020-07-19 LAB — TROPONIN I (HIGH SENSITIVITY): Troponin I (High Sensitivity): 8 ng/L (ref <18)

## 2020-07-19 LAB — ETHANOL: Alcohol, Ethyl (B): <10 mg/dL (ref <10)

## 2020-07-19 MED ORDER — PANTOPRAZOLE SODIUM 40 MG PO TBEC
40.0000 mg | DELAYED_RELEASE_TABLET | Freq: Every day | ORAL | Status: DC
  Administered 2020-07-19 – 2020-07-23 (×5): 40 mg via ORAL
  Filled 2020-07-19 (×5): qty 1

## 2020-07-19 MED ORDER — ADULT MULTIVITAMIN W/MINERALS CH
1.0000 | ORAL_TABLET | Freq: Every day | ORAL | Status: DC
  Administered 2020-07-19 – 2020-07-23 (×5): 1 via ORAL
  Filled 2020-07-19 (×5): qty 1

## 2020-07-19 MED ORDER — SODIUM CHLORIDE 0.9 % IV SOLN
500.0000 mg | INTRAVENOUS | Status: DC
  Administered 2020-07-19: 500 mg via INTRAVENOUS
  Filled 2020-07-19: qty 500

## 2020-07-19 MED ORDER — ALBUTEROL (5 MG/ML) CONTINUOUS INHALATION SOLN
10.0000 mg/h | INHALATION_SOLUTION | RESPIRATORY_TRACT | Status: AC
  Administered 2020-07-19: 10 mg/h via RESPIRATORY_TRACT
  Filled 2020-07-19: qty 20

## 2020-07-19 MED ORDER — SODIUM CHLORIDE 0.9 % IV SOLN
INTRAVENOUS | Status: AC
  Filled 2020-07-19: qty 2

## 2020-07-19 MED ORDER — POLYETHYLENE GLYCOL 3350 17 G PO PACK
17.0000 g | PACK | Freq: Every day | ORAL | Status: DC | PRN
  Filled 2020-07-19: qty 1

## 2020-07-19 MED ORDER — SODIUM CHLORIDE 0.9 % IV SOLN
1.0000 g | Freq: Once | INTRAVENOUS | Status: DC
  Filled 2020-07-19: qty 1

## 2020-07-19 MED ORDER — ENOXAPARIN SODIUM 40 MG/0.4ML ~~LOC~~ SOLN
40.0000 mg | SUBCUTANEOUS | Status: DC
  Administered 2020-07-19 – 2020-07-23 (×5): 40 mg via SUBCUTANEOUS
  Filled 2020-07-19 (×4): qty 0.4

## 2020-07-19 MED ORDER — POTASSIUM CHLORIDE 10 MEQ/100ML IV SOLN
10.0000 meq | INTRAVENOUS | Status: AC
  Administered 2020-07-19 (×4): 10 meq via INTRAVENOUS
  Filled 2020-07-19 (×4): qty 100

## 2020-07-19 MED ORDER — IPRATROPIUM-ALBUTEROL 0.5-2.5 (3) MG/3ML IN SOLN
3.0000 mL | Freq: Four times a day (QID) | RESPIRATORY_TRACT | Status: DC
  Administered 2020-07-19 – 2020-07-20 (×5): 3 mL via RESPIRATORY_TRACT
  Filled 2020-07-19 (×6): qty 3

## 2020-07-19 MED ORDER — IPRATROPIUM-ALBUTEROL 0.5-2.5 (3) MG/3ML IN SOLN: 3.0000 mL | Freq: Once | RESPIRATORY_TRACT | Status: DC

## 2020-07-19 MED ORDER — FUROSEMIDE 10 MG/ML IJ SOLN
40.0000 mg | Freq: Four times a day (QID) | INTRAMUSCULAR | Status: AC
  Administered 2020-07-19 (×2): 40 mg via INTRAVENOUS
  Filled 2020-07-19 (×2): qty 4

## 2020-07-19 MED ORDER — FLUTICASONE FUROATE-VILANTEROL 200-25 MCG/INH IN AEPB
1.0000 | INHALATION_SPRAY | Freq: Every day | RESPIRATORY_TRACT | Status: DC
  Administered 2020-07-21 – 2020-07-23 (×3): 1 via RESPIRATORY_TRACT
  Filled 2020-07-19 (×2): qty 28

## 2020-07-19 MED ORDER — SODIUM CHLORIDE 0.9 % IV SOLN
2.0000 g | INTRAVENOUS | Status: DC
  Administered 2020-07-19 – 2020-07-22 (×4): 2 g via INTRAVENOUS
  Filled 2020-07-19 (×2): qty 20
  Filled 2020-07-19: qty 2
  Filled 2020-07-19: qty 20
  Filled 2020-07-19: qty 2

## 2020-07-19 MED ORDER — METHYLPREDNISOLONE SODIUM SUCC 125 MG IJ SOLR
60.0000 mg | Freq: Two times a day (BID) | INTRAMUSCULAR | Status: AC
  Administered 2020-07-19 – 2020-07-21 (×5): 60 mg via INTRAVENOUS
  Filled 2020-07-19 (×5): qty 2

## 2020-07-19 MED ORDER — METHYLPREDNISOLONE SODIUM SUCC 125 MG IJ SOLR
125.0000 mg | Freq: Once | INTRAMUSCULAR | Status: AC
  Administered 2020-07-19: 125 mg via INTRAVENOUS
  Filled 2020-07-19: qty 2

## 2020-07-19 MED ORDER — CHLORHEXIDINE GLUCONATE CLOTH 2 % EX PADS
6.0000 | MEDICATED_PAD | Freq: Every day | CUTANEOUS | Status: DC
  Administered 2020-07-19 – 2020-07-22 (×4): 6 via TOPICAL

## 2020-07-19 MED ORDER — ORAL CARE MOUTH RINSE
15.0000 mL | Freq: Two times a day (BID) | OROMUCOSAL | Status: DC
  Administered 2020-07-19 – 2020-07-22 (×7): 15 mL via OROMUCOSAL

## 2020-07-19 MED ORDER — METOPROLOL TARTRATE 25 MG PO TABS
25.0000 mg | ORAL_TABLET | Freq: Two times a day (BID) | ORAL | Status: DC
  Administered 2020-07-19 – 2020-07-23 (×9): 25 mg via ORAL
  Filled 2020-07-19 (×10): qty 1

## 2020-07-19 MED ORDER — ALBUTEROL SULFATE HFA 108 (90 BASE) MCG/ACT IN AERS
8.0000 | INHALATION_SPRAY | Freq: Once | RESPIRATORY_TRACT | Status: AC
  Administered 2020-07-19: 8 via RESPIRATORY_TRACT
  Filled 2020-07-19: qty 6.7

## 2020-07-19 MED ORDER — VANCOMYCIN HCL IN DEXTROSE 1-5 GM/200ML-% IV SOLN
1000.0000 mg | Freq: Once | INTRAVENOUS | Status: AC
  Administered 2020-07-19: 1000 mg via INTRAVENOUS
  Filled 2020-07-19: qty 200

## 2020-07-19 MED ORDER — SODIUM CHLORIDE 0.9% FLUSH
9.0000 mL | Freq: Once | INTRAVENOUS | Status: AC
  Administered 2020-07-19: 9 mL via INTRAVENOUS

## 2020-07-19 MED ORDER — BISACODYL 10 MG RE SUPP
10.0000 mg | Freq: Every day | RECTAL | Status: DC
  Administered 2020-07-20: 10 mg via RECTAL
  Filled 2020-07-19 (×3): qty 1

## 2020-07-19 MED ORDER — SODIUM CHLORIDE 0.9 % IV SOLN
2.0000 g | Freq: Once | INTRAVENOUS | Status: AC
  Administered 2020-07-19: 2 g via INTRAVENOUS

## 2020-07-19 MED ORDER — POTASSIUM CHLORIDE CRYS ER 20 MEQ PO TBCR
40.0000 meq | EXTENDED_RELEASE_TABLET | Freq: Once | ORAL | Status: AC
  Administered 2020-07-19: 40 meq via ORAL
  Filled 2020-07-19: qty 2

## 2020-07-19 MED ORDER — FLUTICASONE PROPIONATE 50 MCG/ACT NA SUSP
1.0000 | Freq: Every day | NASAL | Status: DC
  Administered 2020-07-19 – 2020-07-23 (×4): 1 via NASAL
  Filled 2020-07-19 (×2): qty 16

## 2020-07-19 MED ORDER — PERFLUTREN LIPID MICROSPHERE
1.0000 mL | INTRAVENOUS | Status: AC | PRN
  Administered 2020-07-19: 2 mL via INTRAVENOUS
  Filled 2020-07-19: qty 10

## 2020-07-19 MED ORDER — MORPHINE SULFATE (PF) 4 MG/ML IV SOLN
4.0000 mg | Freq: Once | INTRAVENOUS | Status: DC
  Filled 2020-07-19: qty 1

## 2020-07-19 MED ORDER — INSULIN ASPART 100 UNIT/ML ~~LOC~~ SOLN
0.0000 [IU] | Freq: Three times a day (TID) | SUBCUTANEOUS | Status: DC
  Administered 2020-07-19: 2 [IU] via SUBCUTANEOUS
  Administered 2020-07-19: 3 [IU] via SUBCUTANEOUS
  Administered 2020-07-19: 5 [IU] via SUBCUTANEOUS
  Administered 2020-07-20: 2 [IU] via SUBCUTANEOUS
  Administered 2020-07-20 – 2020-07-22 (×6): 1 [IU] via SUBCUTANEOUS

## 2020-07-19 MED ORDER — IPRATROPIUM BROMIDE 0.02 % IN SOLN
0.5000 mg | Freq: Once | RESPIRATORY_TRACT | Status: AC
  Administered 2020-07-19: 0.5 mg via RESPIRATORY_TRACT
  Filled 2020-07-19: qty 2.5

## 2020-07-19 MED ORDER — CITALOPRAM HYDROBROMIDE 20 MG PO TABS
10.0000 mg | ORAL_TABLET | Freq: Every day | ORAL | Status: DC
  Administered 2020-07-19 – 2020-07-23 (×5): 10 mg via ORAL
  Filled 2020-07-19 (×6): qty 1

## 2020-07-19 MED ORDER — DOCUSATE SODIUM 100 MG PO CAPS: 100.0000 mg | ORAL_CAPSULE | Freq: Two times a day (BID) | ORAL | Status: DC | PRN

## 2020-07-19 MED ORDER — ONDANSETRON HCL 4 MG/2ML IJ SOLN: 4.0000 mg | Freq: Four times a day (QID) | INTRAMUSCULAR | Status: DC | PRN

## 2020-07-19 MED ORDER — SODIUM CHLORIDE 0.9 % IV SOLN
500.0000 mg | INTRAVENOUS | Status: AC
  Administered 2020-07-20 – 2020-07-23 (×4): 500 mg via INTRAVENOUS
  Filled 2020-07-19 (×4): qty 500

## 2020-07-19 NOTE — Progress Notes (Signed)
PHARMACY - PHYSICIAN COMMUNICATION CRITICAL VALUE ALERT - BLOOD CULTURE IDENTIFICATION (BCID)  Tony [name] is an 79 y.o. male who presented to [facility] on [facility]/18/2020 with a chief complaint of chest and abdominal pain  Name of physician (or Provider) Contacted: black box  Current antibiotics:Ceftriaxone 2gm IV q24h  Changes to prescribed antibiotics recommended:  No change  Results for orders placed or performed during the hospital encounter of 07/18/20  Blood Culture ID Panel (Reflexed) (Collected: 07/19/2020  1:15 AM)  Result Value Ref Range   Enterococcus faecalis NOT DETECTED NOT DETECTED   Enterococcus Faecium NOT DETECTED NOT DETECTED   Listeria monocytogenes NOT DETECTED NOT DETECTED   Staphylococcus species NOT DETECTED NOT DETECTED   Staphylococcus aureus (BCID) NOT DETECTED NOT DETECTED   Staphylococcus epidermidis NOT DETECTED NOT DETECTED   Staphylococcus lugdunensis NOT DETECTED NOT DETECTED   Streptococcus species NOT DETECTED NOT DETECTED   Streptococcus agalactiae NOT DETECTED NOT DETECTED   Streptococcus pneumoniae NOT DETECTED NOT DETECTED   Streptococcus pyogenes NOT DETECTED NOT DETECTED   A.calcoaceticus-baumannii NOT DETECTED NOT DETECTED   Bacteroides fragilis NOT DETECTED NOT DETECTED   Enterobacterales DETECTED (A) NOT DETECTED   Enterobacter cloacae complex NOT DETECTED NOT DETECTED   Escherichia coli DETECTED (A) NOT DETECTED   Klebsiella aerogenes NOT DETECTED NOT DETECTED   Klebsiella oxytoca NOT DETECTED NOT DETECTED   Klebsiella pneumoniae NOT DETECTED NOT DETECTED   Proteus species NOT DETECTED NOT DETECTED   Salmonella species NOT DETECTED NOT DETECTED   Serratia marcescens NOT DETECTED NOT DETECTED   Haemophilus influenzae NOT DETECTED NOT DETECTED   Neisseria meningitidis NOT DETECTED NOT DETECTED   Pseudomonas aeruginosa NOT DETECTED NOT DETECTED   Stenotrophomonas maltophilia NOT DETECTED NOT DETECTED   Candida albicans NOT  DETECTED NOT DETECTED   Candida auris NOT DETECTED NOT DETECTED   Candida glabrata NOT DETECTED NOT DETECTED   Candida krusei NOT DETECTED NOT DETECTED   Candida parapsilosis NOT DETECTED NOT DETECTED   Candida tropicalis NOT DETECTED NOT DETECTED   Cryptococcus neoformans/gattii NOT DETECTED NOT DETECTED   CTX-M ESBL NOT DETECTED NOT DETECTED   Carbapenem resistance IMP NOT DETECTED NOT DETECTED   Carbapenem resistance KPC NOT DETECTED NOT DETECTED   Carbapenem resistance NDM NOT DETECTED NOT DETECTED   Carbapenem resist OXA 48 LIKE NOT DETECTED NOT DETECTED   Carbapenem resistance VIM NOT DETECTED NOT DETECTED    Angela Adam 07/19/2020  10:58 PM

## 2020-07-19 NOTE — ED Notes (Signed)
Pt departs ED with [facility] at this time.  

## 2020-07-19 NOTE — Plan of Care (Signed)

## 2020-07-19 NOTE — ED Notes (Signed)
Date and time results received: 07/19/20 01:48   Test: Lactic acid Critical Value: 3.2  Name of Provider Notified: Delo  Orders Received? Or Actions Taken?: No new orders

## 2020-07-19 NOTE — Progress Notes (Addendum)
NAME:  Tony [name], MRN:  [id number], DOB:  1941/01/27, LOS: 0 ADMISSION DATE:  07/18/2020, CONSULTATION DATE: 11/5 REFERRING MD: Dr. Stark Jock, CHIEF COMPLAINT: Chest, abdominal pain  Brief History   80 year old male presented to [facility] complaining of sudden onset chest and abdominal pain.  He was profoundly hypoxemic requiring 15 L nonrebreather.  Imaging essentially negative.  Transferred to [facility] long hospital for ICU admission.  Upon arrival to the med center he was hypoxemic requiring 15 L nonrebreather which could not be weaned down.  Given his history of AAA and the sudden onset of his symptoms he was sent for CT angiogram which was stable from prior.  Laboratory evaluation significant for AST 883, ALT 317, total bilirubin 2.2, lactic acid 3.4, and PO2 on ABG 50.  He was transported to [facility] long hospital for ICU admission under PCCM.   Past Medical History  hypertension, pacemaker, prostate cancer treated with radiation and androgen deprivation, AAA, and gastric outlet obstruction.  Prostate cancer initially in 2015 and relapse in 2017. Initially treated with radiation, but now on Zytiga, prednisone, and androgen deprivation with Eligard.   Significant Hospital Events   11/5 admit to [facility]-->pain gone O2 requirements all better after lasix. Working dx decompensated OSA/cor pulmonale and subsequent low flow state resulting in ischemic pain exacerbated by chronic PVD. Also prob element of sepsis (favoring UT source) delirium almost completely resolved.   Consults:    Procedures:    Significant Diagnostic Tests:  CTA chest dissection study >  4.6 cm ascending thoracic aortic aneurysm stable in appearance from the prior exam. 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 Diverticular change without  diverticulitis.  Micro Data:  Flu/COVID PCR 11/5 > neg Blood 11/5 >  Antimicrobials:  Vancomycin 11/5 Cefepime 11/5 >  Interim history/subjective:  Feels much better   Objective   Blood pressure 115/72, pulse 72, temperature 98.6 F (37 C), temperature source Axillary, resp. rate (Abnormal) 25, height 5\' 9"  (1.753 m), weight 104 kg, SpO2 91 %.    FiO2 (%):  [100 %] 100 %   Intake/Output Summary (Last 24 hours) at 07/19/2020 1131 Last data filed at 07/19/2020 1034 Gross per 24 hour  Intake 517.52 ml  Output 1700 ml  Net -1182.48 ml   Filed Weights   07/18/20 2212 07/19/20 0500  Weight: 104 kg 104 kg    Examination: General this is a pleasant 79 year old male he is resting in bed and not in distress HENT NCAT does have JVD  pulm dec bilaterally. Now on 4 lpm no accessory use  Card reg irreg abd protuberant tympanic percussion + bowel sounds Ext + LE edema brisk CR  Neuro awake. Oriented but still a little confused.  gu cl yellow    Resolved Hospital Problem list     Assessment & Plan:   Chest/abd pain/ leg- unclear etiology; CTA C/A/P without source (reoslved)  -ck neg, sed rate neg-->wonder if this was transient ischemia exacerbated by hypoxia? 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 Remarkably better w/ lasix suggesting element of edema.  Suspect there is also element of OSA and OHS which has not been treated that  may be a large contributing factor.  Plan F/u bubble study Cont IS and flutter Slow steroid taper  Cont BDs Day 1 azith (complete 5 days) Consider daily lasix as BUN/cr and BP allow) Walking oximetry prior to dc Needs sleep study   Question of sepsis with lactic acidemia- no white count, no real symptoms of infection BUT PCT 21 so hard to ignore.  Plan Trend fever curve  Repeat LA  (wonder if this was low flow state related to decomp HF) Send UC IV rocephin (to also cover for possible Urine infection as well as CAP coverage)  Acute metabolic Encephalopathy- new, unclear etiology, rather sudden onset per wife; improved with supplemental O2 so could just be related to this Plan Cont supplemental oxygen  Limit sedation   Mild AKI Plan Hold lasix for now Am chem Keep euvolemic   Acute liver injury, does drink EtOH but denies any heavy use, abiraterone can cause liver issues but he has been on this for years->cont to rise but could also be hepatic congestion.  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 [name], PSA undetectable 07/03/20 Plan F/u out-pt   Thoracic/abdominal AA- stable on imaging, unrelated, Plan Out-pt follow up  DM w/ hyperglycemia Plan ssi  Best practice:  Diet: Cardiac Pain/Anxiety/Delirium protocol (if indicated): NA VAP protocol (if indicated): NA DVT prophylaxis: lovenox GI prophylaxis: NA Glucose control: NA Mobility: Bedrest Code Status: full Family Communication: updated Disposition  To triad we will follow for pulm.   Erick Colace ACNP-BC [place] Pager # [phone] OR # [phone] if no answer

## 2020-07-19 NOTE — Progress Notes (Signed)
Echocardiogram 2D Echocardiogram with definity and bubble study has been performed.  Darlina Sicilian M 07/19/2020, 9:04 AM

## 2020-07-19 NOTE — H&P (Addendum)
NAME:  Tony [name], MRN:  [id number], DOB:  08-16-1941, LOS: 0 ADMISSION DATE:  07/18/2020, CONSULTATION DATE: 11/5 REFERRING MD: Dr. Stark Jock, CHIEF COMPLAINT: Chest, abdominal pain  Brief History   79 year old male presented to [facility] complaining of sudden onset chest and abdominal pain.  He was profoundly hypoxemic requiring 15 L nonrebreather.  Imaging essentially negative.  Transferred to [facility] long hospital for ICU admission.  History of present illness   79 year old male with past medical history as below, which is significant for hypertension, pacemaker, prostate cancer treated with radiation and androgen deprivation, AAA, and gastric outlet obstruction.  Prostate cancer initially in 2015 and relapse in 2017. Initially treated with radiation, but now on Zytiga, prednisone, and androgen deprivation with Eligard. He presented to [facility] on 11/4 with complaints of chest and abdominal pain.  Onset of symptoms a few hours prior with no inciting event.  Symptoms progressed to include shortness of breath.  Upon arrival to the med center he was hypoxemic requiring 15 L nonrebreather which could not be weaned down.  Given his history of AAA and the sudden onset of his symptoms he was sent for CT angiogram which was stable from prior.  Laboratory evaluation significant for AST 883, ALT 317, total bilirubin 2.2, lactic acid 3.4, and PO2 on ABG 50.  He was transported to [facility] long hospital for ICU admission under PCCM.  Currently complaints are: distended abdomen, orthopnea, DOE, ill-defined pain across epigastrium that radiates down to legs.  Past Medical History   has a past medical history of Gastric outlet obstruction (02/03/2018), GERD (gastroesophageal reflux disease), History of hiatal hernia, Hypertension, Presence of permanent cardiac pacemaker, Prostate cancer ([place]) (02/08/14), Radiation, Sinus problem, and SSS (sick sinus syndrome) ([place]).   Significant  Hospital Events   11/5 admit to [facility]  Consults:    Procedures:    Significant Diagnostic Tests:  CTA chest dissection study >  4.6 cm ascending thoracic aortic aneurysm stable in appearance from the prior exam. 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 Diverticular change without diverticulitis.  Micro Data:  Flu/COVID PCR 11/5 > neg Blood 11/5 >  Antimicrobials:  Vancomycin 11/5 Cefepime 11/5 >  Interim history/subjective:  Consulted  Objective   Blood pressure 104/62, pulse 90, temperature 98.7 F (37.1 C), temperature source Oral, resp. rate (!) 28, height '5\' 9"'  (1.753 m), weight 104 kg, SpO2 91 %.    FiO2 (%):  [100 %] 100 %  No intake or output data in the 24 hours ending 07/19/20 0141 Filed Weights   07/18/20 2212  Weight: 104 kg    Examination: Chronically ill elderly man in NAD MM dry, trachea midline Wheezing and poor air movement bilaterally, no accessory muscle use AOx3 for me Abdomen distended, tympanic to percussion Moves all 4 ext Heart sounds regular, ext warm Trace edema   Resolved Hospital Problem list     Assessment & Plan:  Overall not really sure what's going on unless this is just strange manifestation of AECOPD.  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 - Echo/bubble - Wean O2 for sats > 88% - IS/flutter - Duonebs, steroids, azithromycin - Dose of lasix  Question of sepsis with lactic acidemia- no white count, no real symptoms of infection - Hold further abx, monitor fever curve  Encephalopathy- new, unclear etiology, rather sudden onset per wife; improved with supplemental O2 so could just be related to this -  Check ammonia  Acute liver injury, does drink EtOH but denies any heavy use, abiraterone can cause liver issues but he has been on this for years - Check INR - Trend LFTs - Hold abiraterone for now  Afib/SSS s/p PPM- EKG unchanged  N/V, constipation - Stool burden does not look bad on CT - Can give trial of dulcolax suppository  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 [name], PSA undetectable 07/03/20  Thoracic/abdominal AA- stable on imaging, unrelated, OP f/u  Best practice:  Diet: Cardiac Pain/Anxiety/Delirium protocol (if indicated): NA VAP protocol (if indicated): NA DVT prophylaxis: lovenox GI prophylaxis: NA Glucose control: NA Mobility: Bedrest Code Status: full Family Communication: updated by outside ER, we can update again later this morning Disposition: ICU  Labs   CBC: Recent Labs  Lab 07/18/20 2220 07/19/20 0029  WBC 5.6  --   HGB 14.4 13.9  HCT 44.3 41.0  MCV 95.5  --   PLT 172  --     Basic Metabolic Panel: Recent Labs  Lab 07/18/20 2220 07/19/20 0029  NA 140 139  K 3.6 3.1*  CL 102  --   CO2 25  --   GLUCOSE 127*  --   BUN 18  --   CREATININE 0.91  --   CALCIUM 9.2  --    GFR: Estimated Creatinine Clearance: 80.8 mL/min (by C-G formula based on SCr of 0.91 mg/dL). Recent Labs  Lab 07/18/20 2220 07/18/20 2346  WBC 5.6  --   LATICACIDVEN  --  3.4*    Liver Function Tests: Recent Labs  Lab 07/18/20 2220  AST 883*  ALT 317*  ALKPHOS 101  BILITOT 2.2*  PROT 7.3  ALBUMIN 4.1   Recent Labs  Lab 07/18/20 2220  LIPASE 21   No results for input(s): AMMONIA in the last 168 hours.  ABG    Component Value Date/Time   PHART 7.400 07/19/2020 0029   PCO2ART 39.1 07/19/2020 0029   PO2ART 50 (L) 07/19/2020 0029   HCO3 24.2 07/19/2020 0029   TCO2 25 07/19/2020 0029   O2SAT 85.0 07/19/2020 0029     Coagulation Profile: No results for input(s): INR, PROTIME  in the last 168 hours.  Cardiac Enzymes: No results for input(s): CKTOTAL, CKMB, CKMBINDEX, TROPONINI in the last 168 hours.  HbA1C: Hemoglobin A1C  Date/Time Value Ref Range Status  05/21/2020 09:52 AM 5.6 4.0 - 5.6 % Final   Hgb A1c MFr Bld  Date/Time Value Ref Range Status  02/13/2019 10:54 AM 6.2 4.6 - 6.5 % Final    Comment:    Glycemic Control Guidelines for People with Diabetes:Non Diabetic:  <6%Goal of Therapy: <7%Additional Action Suggested:  >8%     CBG: No results for input(s): GLUCAP in the last 168 hours.  Review of Systems:    Positive Symptoms in bold:  Constitutional fevers, chills, weight loss, fatigue, anorexia, malaise  Eyes decreased vision, double vision, eye irritation  Ears, Nose, Mouth, Throat sore throat, trouble swallowing, sinus congestion  Cardiovascular chest pain, paroxysmal nocturnal dyspnea, lower ext edema, palpitations   Respiratory SOB, cough, DOE, hemoptysis, wheezing  Gastrointestinal nausea, vomiting, diarrhea  Genitourinary burning with urination, trouble urinating  Musculoskeletal joint  aches, joint swelling, back pain  Integumentary  rashes, skin lesions  Neurological focal weakness, focal numbness, trouble speaking, headaches  Psychiatric depression, anxiety, confusion  Endocrine polyuria, polydipsia, cold intolerance, heat intolerance  Hematologic abnormal bruising, abnormal bleeding, unexplained nose bleeds  Allergic/Immunologic recurrent infections, hives, swollen lymph nodes     Past Medical History  He,  has a past medical history of Gastric outlet obstruction (02/03/2018), GERD (gastroesophageal reflux disease), History of hiatal hernia, Hypertension, Presence of permanent cardiac pacemaker, Prostate cancer ([place]) (02/08/14), Radiation, Sinus problem, and SSS (sick sinus syndrome) ([place]).   Surgical History    Past Surgical History:  Procedure Laterality Date  . APPENDECTOMY  2019   hx hematoma s/p appendectomy  .  ESOPHAGOGASTRODUODENOSCOPY (EGD) WITH PROPOFOL N/A 02/03/2018   Procedure: ESOPHAGOGASTRODUODENOSCOPY (EGD) WITH PROPOFOL;  Surgeon: Clarene Essex, MD;  Location: [place];  Service: Endoscopy;  Laterality: N/A;  . FRACTURE SURGERY    . INSERT / REPLACE / REMOVE PACEMAKER  06/14/2018  . LAPAROSCOPIC APPENDECTOMY N/A 01/21/2018   Procedure: APPENDECTOMY LAPAROSCOPIC;  Surgeon: Kinsinger, Arta Bruce, MD;  Location: [facility];  Service: General;  Laterality: N/A;  . PACEMAKER IMPLANT N/A 06/14/2018   Procedure: PACEMAKER IMPLANT - Dual Chamber;  Surgeon: Sanda Klein, MD;  Location: [facility];  Service: Cardiovascular;  Laterality: N/A;  . PROSTATE BIOPSY  02/08/14   gleason 4+5=9, 12/12 cores positive, 54 gm  . RADIOACTIVE SEED IMPLANT N/A 06/29/2014   Procedure: RADIOACTIVE SEED IMPLANT;  Surgeon: Bernestine Amass, MD;  Location: [facility];  Service: Urology;  Laterality: N/A;  . WRIST FRACTURE SURGERY Right 1980s     Social History   reports that he quit smoking about 7 years ago. His smoking use included cigars. He quit after 60.00 years of use. He has never used smokeless tobacco. He reports current alcohol use of about 14.0 standard drinks of alcohol per week. He reports that he does not use drugs.   Family History   His family history includes Alzheimer's disease in his father and mother; Arthritis in his sister; Cancer in his brother and father.   Allergies Allergies  Allergen Reactions  . Penicillins Rash    Has patient had a PCN reaction causing immediate rash, facial/tongue/throat swelling, SOB or lightheadedness with hypotension: Yes Has patient had a PCN reaction causing severe rash involving mucus membranes or skin necrosis: No Has patient had a PCN reaction that required hospitalization: No Has patient had a PCN reaction occurring within the last 10 years: No If all of the above answers are "NO", then may proceed with Cephalosporin use.     Home  Medications  Prior to Admission medications   Medication Sig Start Date End Date Taking? Authorizing Provider  abiraterone acetate (ZYTIGA) 250 MG tablet TAKE 4 TABLETS (1,000 MG TOTAL) BY MOUTH DAILY. TAKE ON AN EMPTY STOMACH 1 HOUR BEFORE OR 2 HOURS AFTER A MEAL 04/23/20   Wyatt Portela, MD  acetaminophen (TYLENOL) 500 MG tablet Take 500 mg by mouth every 6 (six) hours as needed (for pain).    [provider]  albuterol (VENTOLIN HFA) 108 (90 Base) MCG/ACT inhaler Inhale 2 puffs into the lungs every 6 (six) hours as needed for wheezing or shortness of breath. 04/17/20   [name], [name], MD  amLODipine (NORVASC) 2.5 MG tablet TAKE 1 TABLET BY MOUTH EVERY DAY 06/03/20   [name], [name], MD  aspirin EC 81 MG tablet Take 81 mg by mouth daily.  [provider]  atorvastatin (LIPITOR) 20 MG tablet TAKE 1 TABLET BY MOUTH EVERY DAY 12/12/19   Croitoru, Mihai, MD  carbamide peroxide (DEBROX) 6.5 % OTIC solution Place 5 drops into both ears daily as needed (for earwax).    [provider]  citalopram (CELEXA) 10 MG tablet TAKE 1 TABLET BY MOUTH EVERY DAY 03/04/20   [name], [name], MD  diclofenac sodium (VOLTAREN) 1 % GEL Apply 4 g topically 4 (four) times daily. 02/13/19   [name], [name], MD  fluticasone (FLONASE) 50 MCG/ACT nasal spray PLACE 1 SPRAY INTO BOTH NOSTRILS DAILY AS NEEDED FOR ALLERGIES OR RHINITIS (AT BEDTIME). 09/04/19   [name], [name], MD  Fluticasone-Salmeterol (ADVAIR) 100-50 MCG/DOSE AEPB Inhale 1 puff into the lungs 2 (two) times daily. 11/14/19   [name], [name], MD  furosemide (LASIX) 20 MG tablet TAKE 1 TABLET BY MOUITH DAILY FOR 5 DAYS , THEN DAILY AS NEEDED FOR EDEMA 04/02/20   [name], [name], MD  LORazepam (ATIVAN) 1 MG tablet TAKE 1 TABLET (1 MG TOTAL) BY MOUTH 2 (TWO) TIMES DAILY AS NEEDED. 05/21/20   [name], [name], MD  metoprolol tartrate (LOPRESSOR) 25 MG tablet TAKE 1 TABLET BY MOUTH TWICE A DAY 03/07/20   Croitoru, Mihai, MD  Multiple Vitamin  (MULTIVITAMIN WITH MINERALS) TABS tablet Take 0.5 tablets by mouth every other day. Centrum    [provider]  omeprazole (PRILOSEC) 20 MG capsule TAKE 1 CAPSULE (20 MG TOTAL) BY MOUTH DAILY BEFORE BREAKFAST. 06/03/20   [name], [name], MD  polyethylene glycol powder (GLYCOLAX/MIRALAX) powder Take 17 g by mouth daily as needed for moderate constipation.     [provider]  potassium chloride SA (KLOR-CON M20) 20 MEQ tablet Take 1 tablet (20 mEq total) by mouth 2 (two) times daily. 08/28/19   [name], [name], MD  predniSONE (DELTASONE) 5 MG tablet TAKE 1 TABLET BY MOUTH EVERY DAY WITH BREAKFAST 06/03/20   Wyatt Portela, MD  senna (SENOKOT) 8.6 MG TABS tablet Take 2 tablets by mouth daily as needed for mild constipation.    [provider]    Patient critically ill due to hypoxemic respiratory failure, acute metabolic encephalopathy Interventions to address this today diagnostic workup detailed above, supplemental oxygen Risk of deterioration without these interventions is high  I personally spent 34 minutes providing critical care not including any separately billable procedures  Erskine Emery MD [facility] 07/19/2020 3:37 AM Personal pager: [phone] If unanswered, please page CCM On-call: [phone]

## 2020-07-19 NOTE — ED Notes (Signed)
Pt assisted to bedside commode

## 2020-07-19 NOTE — Progress Notes (Signed)
Chaplain engaged in initial visit with Tony [name] and his wife.  Chaplain introduced herself and her role.  Tony [name] shared how he has experienced different ailments in his body and health challenges since retiring.  He stated to chaplain, "Just keep working."  Retirement for Tony [name] has not yielded what he expected.  He valued being able to work and keep going.  He retired from [employer] after over thirty years of service. His wife also shared how much her body has changed and the different procedures she has faced, while acknowledging that she has enjoyed having her husband home.  Chaplain uplifted how hard it can be to go from being active to having health challenges.   Tony [name] shared what has brought him to the hospital currently and some previous issues that his body has faced as well.   Chaplain offered the ministries of support, listening, and presence.  Chaplain will follow-up.     07/19/20 1400  Clinical Encounter Type  Visited With Patient and family together  Visit Type Initial

## 2020-07-20 DIAGNOSIS — R079 Chest pain, unspecified: Secondary | ICD-10-CM

## 2020-07-20 DIAGNOSIS — R0902 Hypoxemia: Secondary | ICD-10-CM | POA: Diagnosis not present

## 2020-07-20 DIAGNOSIS — I714 Abdominal aortic aneurysm, without rupture: Secondary | ICD-10-CM

## 2020-07-20 DIAGNOSIS — J9601 Acute respiratory failure with hypoxia: Secondary | ICD-10-CM | POA: Diagnosis not present

## 2020-07-20 DIAGNOSIS — R0789 Other chest pain: Secondary | ICD-10-CM | POA: Diagnosis not present

## 2020-07-20 DIAGNOSIS — B9620 Unspecified Escherichia coli [E. coli] as the cause of diseases classified elsewhere: Secondary | ICD-10-CM

## 2020-07-20 DIAGNOSIS — R7881 Bacteremia: Secondary | ICD-10-CM

## 2020-07-20 LAB — CBC
HCT: 40.7 % (ref 39.0–52.0)
Hemoglobin: 13.3 g/dL (ref 13.0–17.0)
MCH: 31.3 pg (ref 26.0–34.0)
MCHC: 32.7 g/dL (ref 30.0–36.0)
MCV: 95.8 fL (ref 80.0–100.0)
Platelets: 153 10*3/uL (ref 150–400)
RBC: 4.25 MIL/uL (ref 4.22–5.81)
RDW: 14 % (ref 11.5–15.5)
WBC: 16.5 10*3/uL — ABNORMAL HIGH (ref 4.0–10.5)
nRBC: 0 % (ref 0.0–0.2)

## 2020-07-20 LAB — URINE CULTURE: Culture: NO GROWTH

## 2020-07-20 LAB — GLUCOSE, CAPILLARY
Glucose-Capillary: 152 mg/dL — ABNORMAL HIGH (ref 70–99)
Glucose-Capillary: 138 mg/dL — ABNORMAL HIGH (ref 70–99)
Glucose-Capillary: 150 mg/dL — ABNORMAL HIGH (ref 70–99)
Glucose-Capillary: 131 mg/dL — ABNORMAL HIGH (ref 70–99)

## 2020-07-20 LAB — COMPREHENSIVE METABOLIC PANEL
ALT: 527 U/L — ABNORMAL HIGH (ref 0–44)
AST: 494 U/L — ABNORMAL HIGH (ref 15–41)
Albumin: 3.7 g/dL (ref 3.5–5.0)
Alkaline Phosphatase: 109 U/L (ref 38–126)
Anion gap: 16 — ABNORMAL HIGH (ref 5–15)
BUN: 26 mg/dL — ABNORMAL HIGH (ref 8–23)
CO2: 21 mmol/L — ABNORMAL LOW (ref 22–32)
Calcium: 9 mg/dL (ref 8.9–10.3)
Chloride: 100 mmol/L (ref 98–111)
Creatinine, Ser: 1.23 mg/dL (ref 0.61–1.24)
GFR, Estimated: >60 mL/min (ref >60)
Glucose, Bld: 170 mg/dL — ABNORMAL HIGH (ref 70–99)
Potassium: 3.9 mmol/L (ref 3.5–5.1)
Sodium: 137 mmol/L (ref 135–145)
Total Bilirubin: 4.7 mg/dL — ABNORMAL HIGH (ref 0.3–1.2)
Total Protein: 6.8 g/dL (ref 6.5–8.1)

## 2020-07-20 MED ORDER — NAPHAZOLINE-PHENIRAMINE 0.025-0.3 % OP SOLN
1.0000 [drp] | Freq: Four times a day (QID) | OPHTHALMIC | Status: DC | PRN
  Filled 2020-07-20: qty 15

## 2020-07-20 MED ORDER — CITALOPRAM HYDROBROMIDE 10 MG PO TABS: 10.0000 mg | ORAL_TABLET | Freq: Every day | ORAL | Status: DC

## 2020-07-20 MED ORDER — ASPIRIN EC 81 MG PO TBEC
81.0000 mg | DELAYED_RELEASE_TABLET | Freq: Every day | ORAL | Status: DC
  Administered 2020-07-20 – 2020-07-23 (×4): 81 mg via ORAL
  Filled 2020-07-20 (×4): qty 1

## 2020-07-20 MED ORDER — IPRATROPIUM-ALBUTEROL 0.5-2.5 (3) MG/3ML IN SOLN
3.0000 mL | Freq: Three times a day (TID) | RESPIRATORY_TRACT | Status: DC
  Administered 2020-07-20 – 2020-07-22 (×5): 3 mL via RESPIRATORY_TRACT
  Filled 2020-07-20 (×5): qty 3

## 2020-07-20 MED ORDER — ABIRATERONE ACETATE 250 MG PO TABS: 1000.0000 mg | ORAL_TABLET | Freq: Every day | ORAL | Status: DC

## 2020-07-20 NOTE — Progress Notes (Addendum)
Tony [name]  JXB:[id number] DOB: 1941-02-10 DOA: 07/18/2020 PCP: [name], [name], MD    Brief Narrative:  79 year old with a history of HTN, pacemaker placement, prostate cancer, AAA, and GOO who presented to [facility] with complaints of acute onset of chest and abdominal pain.  He was found to be profoundly hypoxemic requiring 15 L nonrebreather support.  CXR was without evidence of a pulmonary infiltrate.  He was transferred to the [facility] ICU and admitted by [facility].  Work-up following his admission included a CTa chest which did not reveal any acute pathology.  He was found to have markedly elevated transaminases and a modestly elevated lactic acid as well as significant hypoxemia on ABG.  Clinical concern was for significant volume overload and with aggressive Lasix diuresis the patient improved significantly.  Significant Events:  11/5 admit to [facility] ICU 11/5 TTE EF 65-70% -no WMA -unable to comment on diastolic dysfunction -mild to moderate aortic valve sclerosis without stenosis -no evidence of interatrial shunt on bubble study 11/6 care transferred to [facility]  Antimicrobials:  Vancomycin 11/5 Cefepime 11/5 >  DVT prophylaxis: Lovenox  Subjective: Resting comfortably in a bedside chair.  Denies fevers chills shortness of breath nausea vomiting or abdominal pain.  Reports that his appetite is improving.  Asks that his prostate cancer medication be resumed as soon as possible.  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  has rapidly improved with diuresis - PCCM suspects an element of undiagnosed OSA/OHS - slow steroid taper -continue bronchodilators -complete 5 days of antibiotic therapy for this indication  Acute decompensated diastolic CHF/cor pulmonale - pulmonary edema No evidence of systolic dysfunction on TTE this admission -improved nicely with diuresis -follow I's and O's -no gross volume  overload on exam today  E coli bacteremia 2/2 blood cultures  Suspect urinary source but not presently entirely clear -urine culture pending -does not appear UA was accomplished -continue empiric antibiotic until susceptibilities are available -plan for 10-day course of antibiotic treatment  Chest/abdomen/leg pain Idiopathic -CTa chest abdomen pelvis without evident source -CK total unremarkable -sed rate negative -resolved -?related to hypoxia -consider outpatient eval for PAD when more stable  Sepsis -elevated procalcitonin -lactic acidosis Due to bacteremia -clinically sepsis has resolved  Acute metabolic encephalopathy Sudden onset reported per wife -improved with supplemental oxygen -likely due to hypoxia and acute critical illness  Acute kidney injury Creatinine slowly improving  Transaminitis Likely related to congestive hepatopathy -nonobstructive pattern less convincing for choledocholithiasis -no ductal dilatation on CT abdomen  Chronic atrial fibrillation/SSS status post PPM Monitor on telemetry  Hypokalemia Corrected with supplementation  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 [name] -resumed treatment with LFTs have further improved  Thoracic/abdominal aortic aneurysm Stable on imaging this admission  Hyperglycemia Check A1c -likely due to chronic steroid use and acute illness  Code Status: FULL CODE Family Communication: Spoke with wife at bedside Status is: Inpatient  Remains inpatient appropriate because:Inpatient level of care appropriate due to severity of illness   Dispo: The patient is from: Home              Anticipated d/c is to: unclear presently               Anticipated d/c date is: 3 days              Patient currently is not medically stable to d/c.   Consultants:  none  Objective: Blood pressure 104/67, pulse 69, temperature (!) 97.4 F (36.3 C), temperature source  Oral, resp. rate 14, height 5\' 9"  (1.753 m), weight 104.8 kg, SpO2 92 %.  Intake/Output Summary (Last 24 hours) at 07/20/2020 0800 Last data filed at 07/20/2020 0730 Gross per 24 hour  Intake 450.04 ml  Output 1800 ml  Net -1349.96 ml   Filed Weights   07/18/20 2212 07/19/20 0500 07/20/20 0500  Weight: 104 kg 104 kg 104.8 kg    Examination: General: No acute respiratory distress Lungs: Clear to auscultation bilaterally without wheezes or crackles Cardiovascular: Regular rate and rhythm without murmur gallop or rub normal S1 and S2 Abdomen: Nontender, nondistended, soft, bowel sounds positive, no rebound, no ascites, no appreciable mass Extremities: No significant cyanosis, clubbing, or edema bilateral lower extremities  CBC: Recent Labs  Lab 07/18/20 2220 07/18/20 2220 07/19/20 0029 07/19/20 0558 07/20/20 0336  WBC 5.6  --   --  9.8 16.5*  HGB 14.4   < > 13.9 12.5* 13.3  HCT 44.3   < > 41.0 39.4 40.7  MCV 95.5  --   --  98.3 95.8  PLT 172  --   --  139* 153   < > = values in this interval not displayed.   Basic Metabolic Panel: Recent Labs  Lab 07/18/20 2220 07/18/20 2220 07/19/20 0029 07/19/20 0558 07/20/20 0336  NA 140   < > 139 135 137  K 3.6   < > 3.1* 3.4* 3.9  CL 102  --   --  98 100  CO2 25  --   --  20* 21*  GLUCOSE 127*  --   --  306* 170*  BUN 18  --   --  19 26*  CREATININE 0.91  --   --  1.32* 1.23  CALCIUM 9.2  --   --  8.5* 9.0   < > = values in this interval not displayed.   GFR: Estimated Creatinine Clearance: 60 mL/min (by C-G formula based on SCr of 1.23 mg/dL).  Liver Function Tests: Recent Labs  Lab 07/18/20 2220 07/19/20 0558 07/20/20 0336  AST 883* 1,217* 494*  ALT 317* 665* 527*  ALKPHOS 101 104 109  BILITOT 2.2* 3.2* 4.7*  PROT 7.3 6.2* 6.8  ALBUMIN 4.1 3.5 3.7   Recent Labs  Lab 07/18/20 2220  LIPASE 21   Recent Labs  Lab 07/19/20 0558  AMMONIA 25    Coagulation Profile: Recent Labs  Lab 07/19/20 0558  INR 1.1     Cardiac Enzymes: Recent Labs  Lab 07/19/20 0558  CKTOTAL 57    HbA1C: Hemoglobin A1C  Date/Time Value Ref Range Status  05/21/2020 09:52 AM 5.6 4.0 - 5.6 % Final   Hgb A1c MFr Bld  Date/Time Value Ref Range Status  07/19/2020 05:58 AM 5.9 (H) 4.8 - 5.6 % Final    Comment:    (NOTE) Pre diabetes:          5.7%-6.4%  Diabetes:              >6.4%  Glycemic control for   <7.0% adults with diabetes   02/13/2019 10:54 AM 6.2 4.6 - 6.5 % Final    Comment:    Glycemic Control Guidelines for People with Diabetes:Non Diabetic:  <6%Goal of Therapy: <7%Additional Action Suggested:  >8%     CBG: Recent Labs  Lab 07/19/20 0806 07/19/20 1146 07/19/20 1611 07/19/20 1950  GLUCAP 297* 244* 154* 161*    Recent  Results (from the past 240 hour(s))  Respiratory Panel by RT PCR (Flu A&B, Covid) - Nasopharyngeal Swab     Status: None   Collection Time: 07/18/20 11:46 PM   Specimen: Nasopharyngeal Swab  Result Value Ref Range Status   SARS Coronavirus 2 by RT PCR NEGATIVE NEGATIVE Final    Comment: (NOTE) SARS-CoV-2 target nucleic acids are NOT DETECTED.  The SARS-CoV-2 RNA is generally detectable in upper respiratoy specimens during the acute phase of infection. The lowest concentration of SARS-CoV-2 viral copies this assay can detect is 131 copies/mL. A negative result does not preclude SARS-Cov-2 infection and should not be used as the sole basis for treatment or other patient management decisions. A negative result may occur with  improper specimen collection/handling, submission of specimen other than nasopharyngeal swab, presence of viral mutation(s) within the areas targeted by this assay, and inadequate number of viral copies (<131 copies/mL). A negative result must be combined with clinical observations, patient history, and epidemiological information. The expected result is Negative.  Fact Sheet for Patients:  PinkCheek.be  Fact  Sheet for Healthcare Providers:  GravelBags.it  This test is no t yet approved or cleared by the [place] FDA and  has been authorized for detection and/or diagnosis of SARS-CoV-2 by FDA under an Emergency Use Authorization (EUA). This EUA will remain  in effect (meaning this test can be used) for the duration of the COVID-19 declaration under Section 564(b)(1) of the Act, 21 U.S.C. section 360bbb-3(b)(1), unless the authorization is terminated or revoked sooner.     Influenza A by PCR NEGATIVE NEGATIVE Final   Influenza B by PCR NEGATIVE NEGATIVE Final    Comment: (NOTE) The Xpert Xpress SARS-CoV-2/FLU/RSV assay is intended as an aid in  the diagnosis of influenza from Nasopharyngeal swab specimens and  should not be used as a sole basis for treatment. Nasal washings and  aspirates are unacceptable for Xpert Xpress SARS-CoV-2/FLU/RSV  testing.  Fact Sheet for Patients: PinkCheek.be  Fact Sheet for Healthcare Providers: GravelBags.it  This test is not yet approved or cleared by the [place] FDA and  has been authorized for detection and/or diagnosis of SARS-CoV-2 by  FDA under an Emergency Use Authorization (EUA). This EUA will remain  in effect (meaning this test can be used) for the duration of the  Covid-19 declaration under Section 564(b)(1) of the Act, 21  U.S.C. section 360bbb-3(b)(1), unless the authorization is  terminated or revoked. Performed at [facility], [address] [place], [place] [address]   Blood culture (routine x 2)     Status: None (Preliminary result)   Collection Time: 07/19/20  1:15 AM   Specimen: BLOOD  Result Value Ref Range Status   Specimen Description   Final    BLOOD LEFT ANTECUBITAL Performed at [facility], [address] [place], [place] [address]    Special Requests   Final    BOTTLES DRAWN AEROBIC AND ANAEROBIC  Blood Culture adequate volume Performed at [facility], [address] [place], [place] [address]    Culture  Setup Time   Final    GRAM NEGATIVE RODS IN BOTH AEROBIC AND ANAEROBIC BOTTLES Organism ID to follow CRITICAL RESULT CALLED TO, READ BACK BY AND VERIFIED WITH: PHARMD E JACKSON 07/19/20 AT 2253 SK Performed at [facility], [place] [address], [place], [place] [address]    Culture GRAM NEGATIVE RODS  Final   Report Status PENDING  Incomplete  Blood Culture ID Panel (Reflexed)     Status: Abnormal   Collection Time: 07/19/20  1:15 AM  Result Value Ref Range Status   Enterococcus faecalis NOT DETECTED NOT DETECTED Final   Enterococcus Faecium NOT DETECTED NOT DETECTED Final   Listeria monocytogenes NOT DETECTED NOT DETECTED Final   Staphylococcus species NOT DETECTED NOT DETECTED Final   Staphylococcus aureus (BCID) NOT DETECTED NOT DETECTED Final   Staphylococcus epidermidis NOT DETECTED NOT DETECTED Final   Staphylococcus lugdunensis NOT DETECTED NOT DETECTED Final   Streptococcus species NOT DETECTED NOT DETECTED Final   Streptococcus agalactiae NOT DETECTED NOT DETECTED Final   Streptococcus pneumoniae NOT DETECTED NOT DETECTED Final   Streptococcus pyogenes NOT DETECTED NOT DETECTED Final   A.calcoaceticus-baumannii NOT DETECTED NOT DETECTED Final   Bacteroides fragilis NOT DETECTED NOT DETECTED Final   Enterobacterales DETECTED (A) NOT DETECTED Final    Comment: Enterobacterales represent a large order of gram negative bacteria, not a single organism. CRITICAL RESULT CALLED TO, READ BACK BY AND VERIFIED WITH: PHARMD E JACKSON 07/19/20 AT 2253 SK    Enterobacter cloacae complex NOT DETECTED NOT DETECTED Final   Escherichia coli DETECTED (A) NOT DETECTED Final    Comment: CRITICAL RESULT CALLED TO, READ BACK BY AND VERIFIED WITH: PHARMD E JACKSON 07/19/20 AT 2253 SK    Klebsiella aerogenes NOT DETECTED NOT DETECTED Final   Klebsiella oxytoca NOT  DETECTED NOT DETECTED Final   Klebsiella pneumoniae NOT DETECTED NOT DETECTED Final   Proteus species NOT DETECTED NOT DETECTED Final   Salmonella species NOT DETECTED NOT DETECTED Final   Serratia marcescens NOT DETECTED NOT DETECTED Final   Haemophilus influenzae NOT DETECTED NOT DETECTED Final   Neisseria meningitidis NOT DETECTED NOT DETECTED Final   Pseudomonas aeruginosa NOT DETECTED NOT DETECTED Final   Stenotrophomonas maltophilia NOT DETECTED NOT DETECTED Final   Candida albicans NOT DETECTED NOT DETECTED Final   Candida auris NOT DETECTED NOT DETECTED Final   Candida glabrata NOT DETECTED NOT DETECTED Final   Candida krusei NOT DETECTED NOT DETECTED Final   Candida parapsilosis NOT DETECTED NOT DETECTED Final   Candida tropicalis NOT DETECTED NOT DETECTED Final   Cryptococcus neoformans/gattii NOT DETECTED NOT DETECTED Final   CTX-M ESBL NOT DETECTED NOT DETECTED Final   Carbapenem resistance IMP NOT DETECTED NOT DETECTED Final   Carbapenem resistance KPC NOT DETECTED NOT DETECTED Final   Carbapenem resistance NDM NOT DETECTED NOT DETECTED Final   Carbapenem resist OXA 48 LIKE NOT DETECTED NOT DETECTED Final   Carbapenem resistance VIM NOT DETECTED NOT DETECTED Final    Comment: Performed at [facility], [address], [place], [place] [address]  Blood culture (routine x 2)     Status: None (Preliminary result)   Collection Time: 07/19/20  1:22 AM   Specimen: BLOOD  Result Value Ref Range Status   Specimen Description   Final    BLOOD RIGHT ANTECUBITAL Performed at [facility], [address] [place], [place] [address]    Special Requests   Final    BOTTLES DRAWN AEROBIC AND ANAEROBIC Blood Culture adequate volume Performed at [facility], [address] [place], [place] [address]    Culture  Setup Time   Final    AEROBIC BOTTLE ONLY GRAM NEGATIVE RODS CRITICAL VALUE NOTED.  VALUE IS CONSISTENT WITH PREVIOUSLY REPORTED AND CALLED  VALUE.    Culture   Final    NO GROWTH < 24  HOURS Performed at [facility], [place] [address], [place], [place] [address]    Report Status PENDING  Incomplete  MRSA PCR Screening     Status: None   Collection Time: 07/19/20  5:40 AM   Specimen: Nasal Mucosa; Nasopharyngeal  Result Value Ref Range Status   MRSA by PCR NEGATIVE NEGATIVE Final    Comment:        The GeneXpert MRSA Assay (FDA approved for NASAL specimens only), is one component of a comprehensive MRSA colonization surveillance program. It is not intended to diagnose MRSA infection nor to guide or monitor treatment for MRSA infections. Performed at [facility], [place] [address] [place],  [address]      Scheduled Meds: . bisacodyl  10 mg Rectal Daily  . Chlorhexidine Gluconate Cloth  6 each Topical Daily  . citalopram  10 mg Oral Daily  . enoxaparin (LOVENOX) injection  40 mg Subcutaneous Q24H  . fluticasone  1 spray Each Nare Daily  . fluticasone furoate-vilanterol  1 puff Inhalation Daily  . insulin aspart  0-9 Units Subcutaneous TID WC  . ipratropium-albuterol  3 mL Nebulization QID  . mouth rinse  15 mL Mouth Rinse BID  . methylPREDNISolone (SOLU-MEDROL) injection  60 mg Intravenous Q12H  . metoprolol tartrate  25 mg Oral BID  . multivitamin with minerals  1 tablet Oral Daily  . pantoprazole  40 mg Oral Daily   Continuous Infusions: . azithromycin Stopped (07/20/20 0717)  . cefTRIAXone (ROCEPHIN)  IV Stopped (07/19/20 1520)     LOS: 1 day   Cherene Altes, MD [facility] Office  [phone] Pager - Text Page per Amion  If 7PM-7AM, please contact night-coverage per Amion 07/20/2020, 8:00 AM

## 2020-07-20 NOTE — Progress Notes (Signed)
Patient received as transfer from ICU.  Agree with previous RN's assessment of patient.  Patient placed on telemetry and confirmed with CMT.  Patient and wife oriented to equipment and unit.  Patient currently resting in chair with wife at bedside, in NAD.  Will continue to monitor.

## 2020-07-20 NOTE — Progress Notes (Signed)
NAME:  Tony [name], MRN:  [id number], DOB:  02-09-1941, LOS: 1 ADMISSION DATE:  07/18/2020, CONSULTATION DATE:  11/5 REFERRING MD:  Stark Jock, CHIEF COMPLAINT:  Chest and abdominal pain   Brief History   79 year old male presented to [facility] complaining of sudden onset chest and abdominal pain.  He was profoundly hypoxemic requiring 15 L nonrebreather.  Imaging essentially negative.  Transferred to [facility] for ICU admission.  Past Medical History  hypertension, pacemaker, prostate cancer treated with radiation and androgen deprivation, AAA, and gastric outlet obstruction.  Prostate cancer initially in 2015 and relapse in 2017. Initially treated with radiation, but now on Zytiga, prednisone, and androgen deprivation with Eligard.  Significant Hospital Events   11/5 admit to [facility]-->pain gone O2 requirements all better after lasix. Working dx decompensated OSA/cor pulmonale and subsequent low flow state resulting in ischemic pain exacerbated by chronic PVD. Also prob element of sepsis (favoring UT source) delirium almost completely resolved.   Consults:    Procedures:    Significant Diagnostic Tests:  CTA chest dissection study >  4.6 cm ascending thoracic aortic aneurysm stable in appearance from the prior exam. 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 Diverticular change without diverticulitis; pulmonary parenchyma reviewed, emphysema, non-specific interstitial changes in bases 11/5 echo >>>  Micro Data:  Flu/COVID PCR 11/5 > neg Blood 11/5 > e coli  3/4 bottles  Antimicrobials:  Vancomycin 11/5 Cefepime 11/5 >  Interim history/subjective:  Feels better Breathing is OK  Objective   Blood pressure 104/67, pulse 69, temperature (!) 97.4 F (36.3 C), temperature source Oral, resp. rate 14, height 5'  9" (1.753 m), weight 104.8 kg, SpO2 92 %.        Intake/Output Summary (Last 24 hours) at 07/20/2020 0739 Last data filed at 07/20/2020 0640 Gross per 24 hour  Intake 295.02 ml  Output 1800 ml  Net -1504.98 ml   Filed Weights   07/18/20 2212 07/19/20 0500 07/20/20 0500  Weight: 104 kg 104 kg 104.8 kg    Examination: General:  Resting comfortably in bed HENT: NCAT OP clear PULM: CTA B, normal effort CV: RRR, no mgr GI: BS+, soft, nontender MSK: normal bulk and tone Neuro: awake, alert, no distress, MAEW   Resolved Hospital Problem list     Assessment & Plan:  Acute on chronic hypoxemic respiratory failure due to acute pulmonary edema > improved Echo pending Keep net negative Wean off O2 for O2 saturation > 88%  Centrilobular emphysema and non-specific fibrotic changes of lungs Needs outpatient PFT, HRCT Needs outpatient pulmonary follow up breo here, prn albuterol Can go back on advair at home  Acute metabolic encephalopathy > resolved No sedating meds  OSA likely CPAP qHS Needs outpatient sleep study  E coli bacteremia> urinary source? Prostate related? Per [place]  Best practice:   Per [facility] Will see again on 11/8 or sooner if needed  Labs   CBC: Recent Labs  Lab 07/18/20 2220 07/19/20 0029 07/19/20 0558 07/20/20 0336  WBC 5.6  --  9.8 16.5*  HGB 14.4 13.9 12.5* 13.3  HCT 44.3 41.0 39.4 40.7  MCV 95.5  --  98.3 95.8  PLT 172  --  139* 242    Basic Metabolic Panel: Recent Labs  Lab 07/18/20 2220 07/19/20 0029 07/19/20 0558 07/20/20 0336  NA 140 139 135 137  K 3.6 3.1* 3.4* 3.9  CL 102  --  98 100  CO2 25  --  20* 21*  GLUCOSE 127*  --  306* 170*  BUN 18  --  19 26*  CREATININE 0.91  --  1.32* 1.23  CALCIUM 9.2  --  8.5* 9.0   GFR: Estimated Creatinine Clearance: 60 mL/min (by C-G formula based on SCr of 1.23 mg/dL). Recent Labs  Lab 07/18/20 2220 07/18/20 2346 07/19/20 0121 07/19/20 0558 07/20/20 0336  PROCALCITON  --   --   --   21.70  --   WBC 5.6  --   --  9.8 16.5*  LATICACIDVEN  --  3.4* 3.2*  --   --     Liver Function Tests: Recent Labs  Lab 07/18/20 2220 07/19/20 0558 07/20/20 0336  AST 883* 1,217* 494*  ALT 317* 665* 527*  ALKPHOS 101 104 109  BILITOT 2.2* 3.2* 4.7*  PROT 7.3 6.2* 6.8  ALBUMIN 4.1 3.5 3.7   Recent Labs  Lab 07/18/20 2220  LIPASE 21   Recent Labs  Lab 07/19/20 0558  AMMONIA 25    ABG    Component Value Date/Time   PHART 7.400 07/19/2020 0029   PCO2ART 39.1 07/19/2020 0029   PO2ART 50 (L) 07/19/2020 0029   HCO3 24.2 07/19/2020 0029   TCO2 25 07/19/2020 0029   O2SAT 85.0 07/19/2020 0029     Coagulation Profile: Recent Labs  Lab 07/19/20 0558  INR 1.1    Cardiac Enzymes: Recent Labs  Lab 07/19/20 0558  CKTOTAL 57    HbA1C: Hemoglobin A1C  Date/Time Value Ref Range Status  05/21/2020 09:52 AM 5.6 4.0 - 5.6 % Final   Hgb A1c MFr Bld  Date/Time Value Ref Range Status  07/19/2020 05:58 AM 5.9 (H) 4.8 - 5.6 % Final    Comment:    (NOTE) Pre diabetes:          5.7%-6.4%  Diabetes:              >6.4%  Glycemic control for   <7.0% adults with diabetes   02/13/2019 10:54 AM 6.2 4.6 - 6.5 % Final    Comment:    Glycemic Control Guidelines for People with Diabetes:Non Diabetic:  <6%Goal of Therapy: <7%Additional Action Suggested:  >8%     CBG: Recent Labs  Lab 07/19/20 0806 07/19/20 1146 07/19/20 1611 07/19/20 1950  GLUCAP 297* 244* 154* 161*     Critical care time: n/a    Roselie Awkward, MD [place] [facility] Pager: [phone] Cell: [phone] If no response, call [phone]

## 2020-07-21 DIAGNOSIS — J9601 Acute respiratory failure with hypoxia: Secondary | ICD-10-CM | POA: Diagnosis not present

## 2020-07-21 DIAGNOSIS — R0902 Hypoxemia: Secondary | ICD-10-CM | POA: Diagnosis not present

## 2020-07-21 DIAGNOSIS — I714 Abdominal aortic aneurysm, without rupture: Secondary | ICD-10-CM | POA: Diagnosis not present

## 2020-07-21 DIAGNOSIS — R0789 Other chest pain: Secondary | ICD-10-CM | POA: Diagnosis not present

## 2020-07-21 LAB — CBC
HCT: 40 % (ref 39.0–52.0)
Hemoglobin: 13.3 g/dL (ref 13.0–17.0)
MCH: 31.4 pg (ref 26.0–34.0)
MCHC: 33.3 g/dL (ref 30.0–36.0)
MCV: 94.3 fL (ref 80.0–100.0)
Platelets: 161 10*3/uL (ref 150–400)
RBC: 4.24 MIL/uL (ref 4.22–5.81)
RDW: 13.9 % (ref 11.5–15.5)
WBC: 13.7 10*3/uL — ABNORMAL HIGH (ref 4.0–10.5)
nRBC: 0 % (ref 0.0–0.2)

## 2020-07-21 LAB — GLUCOSE, CAPILLARY
Glucose-Capillary: 140 mg/dL — ABNORMAL HIGH (ref 70–99)
Glucose-Capillary: 119 mg/dL — ABNORMAL HIGH (ref 70–99)
Glucose-Capillary: 139 mg/dL — ABNORMAL HIGH (ref 70–99)
Glucose-Capillary: 158 mg/dL — ABNORMAL HIGH (ref 70–99)

## 2020-07-21 LAB — COMPREHENSIVE METABOLIC PANEL
ALT: 364 U/L — ABNORMAL HIGH (ref 0–44)
AST: 185 U/L — ABNORMAL HIGH (ref 15–41)
Albumin: 3.5 g/dL (ref 3.5–5.0)
Alkaline Phosphatase: 115 U/L (ref 38–126)
Anion gap: 11 (ref 5–15)
BUN: 33 mg/dL — ABNORMAL HIGH (ref 8–23)
CO2: 23 mmol/L (ref 22–32)
Calcium: 9.3 mg/dL (ref 8.9–10.3)
Chloride: 103 mmol/L (ref 98–111)
Creatinine, Ser: 0.93 mg/dL (ref 0.61–1.24)
GFR, Estimated: >60 mL/min (ref >60)
Glucose, Bld: 148 mg/dL — ABNORMAL HIGH (ref 70–99)
Potassium: 4.2 mmol/L (ref 3.5–5.1)
Sodium: 137 mmol/L (ref 135–145)
Total Bilirubin: 1.5 mg/dL — ABNORMAL HIGH (ref 0.3–1.2)
Total Protein: 6.7 g/dL (ref 6.5–8.1)

## 2020-07-21 LAB — CULTURE, BLOOD (ROUTINE X 2)
Special Requests: ADEQUATE
Special Requests: ADEQUATE

## 2020-07-21 LAB — MAGNESIUM: Magnesium: 2.3 mg/dL (ref 1.7–2.4)

## 2020-07-21 MED ORDER — PREDNISONE 20 MG PO TABS
60.0000 mg | ORAL_TABLET | Freq: Every day | ORAL | Status: DC
  Administered 2020-07-22: 60 mg via ORAL
  Filled 2020-07-21: qty 3

## 2020-07-21 NOTE — Evaluation (Signed)
Physical Therapy Evaluation Patient Details Name: Tony [name] MRN: [id number] DOB: 12/04/1940 Today's Date: 07/21/2020   History of Present Illness  79 year old with a history of HTN, pacemaker placement, prostate cancer, AAA, and GOO who presented to [facility] with complaints of acute onset of chest and abdominal pain. Patient found to be hypoxic and briefly requiring 15 L oxygen but without acute pahtology. Currently on 4L [place].  Clinical Impression  The patient is quite talkative. Patient resting  On 4 L SPO2 95%. Patient ambulated x 100' on RA SPO2 89-94%.  Noted 3/4 dyspnea. Patient resides with wife and daughter/family. Patient should progress to Dc home. Will most like require another saturation walk test  Prior to DC . Pt admitted with above diagnosis.  Pt currently with functional limitations due to the deficits listed below (see PT Problem List). Pt will benefit from skilled PT to increase their independence and safety with mobility to allow discharge to the venue listed below.     Follow Up Recommendations Home health PT;Supervision/Assistance - 24 hour    Equipment Recommendations  None recommended by PT    Recommendations for Other Services       Precautions / Restrictions Precautions Precautions: Fall Precaution Comments: monitor sats Restrictions Weight Bearing Restrictions: No      Mobility  Bed Mobility Overal bed mobility: Modified Independent             General bed mobility comments: increased time, use of bed rail.    Transfers Overall transfer level: Needs assistance Equipment used: Rolling walker (2 wheeled) Transfers: Sit to/from Stand Sit to Stand: Min guard         General transfer comment: multimodal cues for safety, frequent cues to stay on task.  Ambulation/Gait Ambulation/Gait assistance: Min assist Gait Distance (Feet): 50 Feet Assistive device: Rolling walker (2 wheeled);1 person hand held assist Gait Pattern/deviations:  Step-through pattern;Decreased stride length Gait velocity: decr   General Gait Details: initially ambuated x 15' without AD, gait unsteady. provided RW for stability with improved  balance. Patient ambulated on RA, SPO2 94%. Was on 4 L prior to ambulation  Stairs            Wheelchair Mobility    Modified Rankin (Stroke Patients Only)       Balance Overall balance assessment: Mild deficits observed, not formally tested                                           Pertinent Vitals/Pain Pain Assessment: No/denies pain    Home Living Family/patient expects to be discharged to:: Private residence Living Arrangements: Spouse/significant other Available Help at Discharge: Family;Available 24 hours/day Type of Home: House     Entrance Stairs-Number of Steps: 1 Home Layout: One level Home Equipment: Cane - single point;Bedside commode;Walker - 4 wheels;Shower seat - built in      Prior Function Level of Independence: Independent with assistive device(s)         Comments: amb with a cane outside     Hand Dominance        Extremity/Trunk Assessment   Upper Extremity Assessment Upper Extremity Assessment: Overall WFL for tasks assessed    Lower Extremity Assessment Lower Extremity Assessment: Generalized weakness    Cervical / Trunk Assessment Cervical / Trunk Assessment: Normal  Communication   Communication: No difficulties  Cognition Arousal/Alertness: Awake/alert Behavior During Therapy: WFL for  tasks assessed/performed Overall Cognitive Status: No family/caregiver present to determine baseline cognitive functioning                                 General Comments: patient fidgeting with lines, moving  items from bed onto cabinet then back onto bed. Constantly talking, cues for staying on task.      General Comments      Exercises     Assessment/Plan    PT Assessment Patient needs continued PT services  PT  Problem List Decreased strength;Decreased cognition;Decreased knowledge of use of DME;Decreased activity tolerance;Obesity;Decreased safety awareness;Decreased balance;Decreased knowledge of precautions;Decreased mobility       PT Treatment Interventions DME instruction;Gait training;Functional mobility training;Therapeutic activities;Therapeutic exercise;Patient/family education;Balance training    PT Goals (Current goals can be found in the Care Plan section)  Acute Rehab PT Goals Patient Stated Goal: to go home PT Goal Formulation: With patient Time For Goal Achievement: 08/04/20 Potential to Achieve Goals: Good    Frequency Min 3X/week   Barriers to discharge        Co-evaluation               AM-PAC PT "6 Clicks" Mobility  Outcome Measure Help needed turning from your back to your side while in a flat bed without using bedrails?: A Little Help needed moving from lying on your back to sitting on the side of a flat bed without using bedrails?: A Little Help needed moving to and from a bed to a chair (including a wheelchair)?: A Little Help needed standing up from a chair using your arms (e.g., wheelchair or bedside chair)?: A Little Help needed to walk in hospital room?: A Little Help needed climbing 3-5 steps with a railing? : A Lot 6 Click Score: 17    End of Session Equipment Utilized During Treatment: Gait belt Activity Tolerance: Patient tolerated treatment well Patient left: in chair;with call bell/phone within reach;with chair alarm set Nurse Communication: Mobility status (SPO2 94% on RA) PT Visit Diagnosis: Unsteadiness on feet (R26.81);Difficulty in walking, not elsewhere classified (R26.2)    Time: 1638-4665 PT Time Calculation (min) (ACUTE ONLY): 19 min   Charges:   PT Evaluation $PT Eval Low Complexity: [place] [facility] Acute Rehabilitation Services Pager [phone] Office [phone]   Claretha Cooper 07/21/2020,  12:52 PM

## 2020-07-21 NOTE — Progress Notes (Signed)
Pt declined cpap tonight.  Pt stated that he does not wear one at home and wants to talk more about it with his lung doctor.  Pt was advised that RT is available all night should he change his mind.

## 2020-07-21 NOTE — Evaluation (Signed)
Occupational Therapy Evaluation Patient Details Name: Tony [name] MRN: [id number] DOB: [birth year] Today's Date: 07/21/2020    History of Present Illness 79 year old with a history of HTN, pacemaker placement, prostate cancer, AAA, and GOO who presented to [facility] with complaints of acute onset of chest and abdominal pain. Patient found to be hypoxic and briefly requiring 15 L oxygen but without acute pahtology. Currently on 4L [place].   Clinical Impression   [name] is a 79 year old man who today presents on 4L on extended tubing supine in bed. On evaluation patient demonstrates ability to perform bed mobility, ambulate in hall with RW on RA, don socks and stand without hand hold to perform all other ADLs. Patient's o2 sat maintained at 92% with ambulation and left in recliner on RA. No OT needs at this time.    Follow Up Recommendations  No OT follow up    Equipment Recommendations  None recommended by OT    Recommendations for Other Services       Precautions / Restrictions Precautions Precautions: Fall Restrictions Weight Bearing Restrictions: No      Mobility Bed Mobility Overal bed mobility: Modified Independent             General bed mobility comments: increased time, use of bed rail.    Transfers Overall transfer level: Needs assistance Equipment used: Rolling walker (2 wheeled)             General transfer comment: Typically uses a cane, Ambulated with RW in hall with min guard on RA. O2 sat maintained at 92%. Left patient on RA.    Balance Overall balance assessment: Mild deficits observed, not formally tested                                         ADL either performed or assessed with clinical judgement   ADL Overall ADL's : At baseline                                             Vision Patient Visual Report: No change from baseline       Perception     Praxis      Pertinent  Vitals/Pain Pain Assessment: No/denies pain     Hand Dominance Right   Extremity/Trunk Assessment Upper Extremity Assessment Upper Extremity Assessment: Overall WFL for tasks assessed   Lower Extremity Assessment Lower Extremity Assessment: Defer to PT evaluation   Cervical / Trunk Assessment Cervical / Trunk Assessment: Normal   Communication Communication Communication: No difficulties   Cognition Arousal/Alertness: Awake/alert Behavior During Therapy: WFL for tasks assessed/performed Overall Cognitive Status: Within Functional Limits for tasks assessed                                     General Comments       Exercises     Shoulder Instructions      Home Living Family/patient expects to be discharged to:: Private residence Living Arrangements: Spouse/significant other (children and grandchildren.) Available Help at Discharge: Family;Available 24 hours/day Type of Home: House Home Access: Stairs to enter [employer] of Steps: 1   Home Layout: One level     Bathroom Shower/Tub:  Walk-in shower;Door   Bathroom Toilet: Handicapped height     Home Equipment: Winger - single point;Bedside commode;Walker - 4 wheels;Shower seat - built in          Prior Functioning/Environment Level of Independence: Independent with assistive device(s)        Comments: amb with a cane outside        OT Problem List:        OT Treatment/Interventions:      OT Goals(Current goals can be found in the care plan section) Acute Rehab OT Goals OT Goal Formulation: All assessment and education complete, DC therapy  OT Frequency:     Barriers to D/C:            Co-evaluation              AM-PAC OT "6 Clicks" Daily Activity     Outcome Measure Help from another person eating meals?: None Help from another person taking care of personal grooming?: None Help from another person toileting, which includes using toliet, bedpan, or urinal?:  None Help from another person bathing (including washing, rinsing, drying)?: None Help from another person to put on and taking off regular upper body clothing?: None Help from another person to put on and taking off regular lower body clothing?: None 6 Click Score: 24   End of Session Equipment Utilized During Treatment: Gait belt;Rolling walker Nurse Communication: Mobility status  Activity Tolerance: Patient tolerated treatment well Patient left: in chair;with chair alarm set  OT Visit Diagnosis: Muscle weakness (generalized) (M62.81)                Time: 1540-0867 OT Time Calculation (min): 16 min Charges:  OT General Charges $OT Visit: 1 Visit OT Evaluation $OT Eval Low Complexity: 1 Low  Jency Schnieders, OTR/L [place]  Office [phone] Pager: [phone]   Lenward Chancellor 07/21/2020, 10:26 AM

## 2020-07-21 NOTE — Progress Notes (Signed)
Rafik Koppel Carrera  IWP:[id number] DOB: [birth year] DOA: 07/18/2020 PCP: [name], [name], MD    Brief Narrative:  79 year old with a history of HTN, pacemaker placement, prostate cancer, AAA, and GOO who presented to [facility] with complaints of acute onset of chest and abdominal pain.  He was found to be profoundly hypoxemic requiring 15 L nonrebreather support.  CXR was without evidence of a pulmonary infiltrate.  He was transferred to the [facility], ICU and admitted by [facility].  Work-up following his admission included a CTa chest which did not reveal any acute pathology.  He was found to have markedly elevated transaminases and a modestly elevated lactic acid as well as significant hypoxemia on ABG.  Clinical concern was for significant volume overload and with aggressive Lasix diuresis the patient improved significantly.  Significant Events:  11/5 admit to [facility] ICU 11/5 TTE EF 65-70% -no WMA -unable to comment on diastolic dysfunction -mild to moderate aortic valve sclerosis without stenosis -no evidence of interatrial shunt on bubble study 11/6 care transferred to [facility]  Antimicrobials:  Vancomycin 11/5 Cefepime 11/5 >  DVT prophylaxis: Lovenox  Subjective: Oxygen saturations in the mid 90s on 4 L nasal cannula support.  Vital signs otherwise stable.  Afebrile.  Sitting up in a bedside chair.  Reports that he feels much better today.  Has been up and around room some.  Denies chest pain shortness of breath or abdominal pain.  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  has rapidly improved with diuresis - PCCM suspects an element of undiagnosed OSA/OHS - slow steroid taper -continue bronchodilators -complete 5 days of antibiotic therapy for this indication  Acute decompensated diastolic CHF/cor pulmonale - pulmonary edema No evidence of systolic dysfunction on TTE this admission -improved nicely with diuresis  -follow I's and O's -possible element of cor pulmonale versus simple diastolic heart failure  Filed Weights   07/18/20 2212 07/19/20 0500 07/20/20 0500  Weight: 104 kg 104 kg 104.8 kg     E coli bacteremia 2/2 blood cultures  Suspect urinary source but not presently entirely clear -urine culture no growth -does not appear UA was accomplished -continue empiric antibiotic until susceptibilities are available - plan for 10-day course of antibiotic treatment  Chest/abdomen/leg pain Idiopathic -CTa chest abdomen pelvis without evident source -CK total unremarkable -sed rate negative -resolved -?related to hypoxia -consider outpatient eval for PAD when more stable  Sepsis POA - elevated procalcitonin - lactic acidosis Due to bacteremia -clinically sepsis has resolved  Acute metabolic encephalopathy Sudden onset reported per wife -improved with supplemental oxygen -likely due to hypoxia and acute critical illness/sepsis  Acute kidney injury Creatinine has normalized  Transaminitis congestive hepatopathy v/s shock liver versus primary hepatic infection - nonobstructive pattern less convincing for choledocholithiasis/stricture - no ductal dilatation on CT abdomen  Chronic atrial fibrillation/SSS status post PPM Stable - d/c tele today  Hypokalemia Corrected with supplementation  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 [name] - resumed treatment when LFTs have further improved  Thoracic/abdominal aortic aneurysm Stable on imaging this admission  Hyperglycemia A1c 5.9 - likely due to chronic steroid use and acute illness  Code Status: FULL CODE Family Communication: Status is: Inpatient  Remains inpatient appropriate because:Inpatient level of care appropriate due to severity of illness   Dispo: The patient is from: Home              Anticipated  d/c is to: unclear presently               Anticipated d/c date  is: 3 days              Patient currently is not medically stable to d/c.   Consultants:  none  Objective: Blood pressure (!) 146/87, pulse 63, temperature (!) 97.5 F (36.4 C), temperature source Oral, resp. rate 19, height 5\' 9"  (1.753 m), weight 104.8 kg, SpO2 96 %.  Intake/Output Summary (Last 24 hours) at 07/21/2020 0751 Last data filed at 07/20/2020 1626 Gross per 24 hour  Intake 480 ml  Output 500 ml  Net -20 ml   Filed Weights   07/18/20 2212 07/19/20 0500 07/20/20 0500  Weight: 104 kg 104 kg 104.8 kg    Examination: General: No acute respiratory distress Lungs: fine bibasilar crackles with no wheezing Cardiovascular: Regular rate without murmur Abdomen: Overweight, soft, no rebound, bowel sounds positive, no appreciable mass Extremities: Trace bilateral lower extremity edema  CBC: Recent Labs  Lab 07/19/20 0558 07/20/20 0336 07/21/20 0627  WBC 9.8 16.5* 13.7*  HGB 12.5* 13.3 13.3  HCT 39.4 40.7 40.0  MCV 98.3 95.8 94.3  PLT 139* 153 607   Basic Metabolic Panel: Recent Labs  Lab 07/19/20 0558 07/20/20 0336 07/21/20 0627  NA 135 137 137  K 3.4* 3.9 4.2  CL 98 100 103  CO2 20* 21* 23  GLUCOSE 306* 170* 148*  BUN 19 26* 33*  CREATININE 1.32* 1.23 0.93  CALCIUM 8.5* 9.0 9.3  MG  --   --  2.3   GFR: Estimated Creatinine Clearance: 79.3 mL/min (by C-G formula based on SCr of 0.93 mg/dL).  Liver Function Tests: Recent Labs  Lab 07/18/20 2220 07/19/20 0558 07/20/20 0336 07/21/20 0627  AST 883* 1,217* 494* 185*  ALT 317* 665* 527* 364*  ALKPHOS 101 104 109 115  BILITOT 2.2* 3.2* 4.7* 1.5*  PROT 7.3 6.2* 6.8 6.7  ALBUMIN 4.1 3.5 3.7 3.5   Recent Labs  Lab 07/18/20 2220  LIPASE 21   Recent Labs  Lab 07/19/20 0558  AMMONIA 25    Coagulation Profile: Recent Labs  Lab 07/19/20 0558  INR 1.1    Cardiac Enzymes: Recent Labs  Lab 07/19/20 0558  CKTOTAL 57    HbA1C: Hemoglobin A1C  Date/Time Value Ref Range Status  05/21/2020  09:52 AM 5.6 4.0 - 5.6 % Final   Hgb A1c MFr Bld  Date/Time Value Ref Range Status  07/19/2020 05:58 AM 5.9 (H) 4.8 - 5.6 % Final    Comment:    (NOTE) Pre diabetes:          5.7%-6.4%  Diabetes:              >6.4%  Glycemic control for   <7.0% adults with diabetes   02/13/2019 10:54 AM 6.2 4.6 - 6.5 % Final    Comment:    Glycemic Control Guidelines for People with Diabetes:Non Diabetic:  <6%Goal of Therapy: <7%Additional Action Suggested:  >8%     CBG: Recent Labs  Lab 07/20/20 0802 07/20/20 1233 07/20/20 1739 07/20/20 2029 07/21/20 0727  GLUCAP 152* 138* 150* 131* 140*    Recent Results (from the past 240 hour(s))  Respiratory Panel by RT PCR (Flu A&B, Covid) - Nasopharyngeal Swab     Status: None   Collection Time: 07/18/20 11:46 PM   Specimen: Nasopharyngeal Swab  Result Value Ref Range Status   SARS Coronavirus 2 by RT PCR NEGATIVE  NEGATIVE Final    Comment: (NOTE) SARS-CoV-2 target nucleic acids are NOT DETECTED.  The SARS-CoV-2 RNA is generally detectable in upper respiratoy specimens during the acute phase of infection. The lowest concentration of SARS-CoV-2 viral copies this assay can detect is 131 copies/mL. A negative result does not preclude SARS-Cov-2 infection and should not be used as the sole basis for treatment or other patient management decisions. A negative result may occur with  improper specimen collection/handling, submission of specimen other than nasopharyngeal swab, presence of viral mutation(s) within the areas targeted by this assay, and inadequate number of viral copies (<131 copies/mL). A negative result must be combined with clinical observations, patient history, and epidemiological information. The expected result is Negative.  Fact Sheet for Patients:  PinkCheek.be  Fact Sheet for Healthcare Providers:  GravelBags.it  This test is no t yet approved or cleared by the  [place] FDA and  has been authorized for detection and/or diagnosis of SARS-CoV-2 by FDA under an Emergency Use Authorization (EUA). This EUA will remain  in effect (meaning this test can be used) for the duration of the COVID-19 declaration under Section 564(b)(1) of the Act, 21 U.S.C. section 360bbb-3(b)(1), unless the authorization is terminated or revoked sooner.     Influenza A by PCR NEGATIVE NEGATIVE Final   Influenza B by PCR NEGATIVE NEGATIVE Final    Comment: (NOTE) The Xpert Xpress SARS-CoV-2/FLU/RSV assay is intended as an aid in  the diagnosis of influenza from Nasopharyngeal swab specimens and  should not be used as a sole basis for treatment. Nasal washings and  aspirates are unacceptable for Xpert Xpress SARS-CoV-2/FLU/RSV  testing.  Fact Sheet for Patients: PinkCheek.be  Fact Sheet for Healthcare Providers: GravelBags.it  This test is not yet approved or cleared by the [place] FDA and  has been authorized for detection and/or diagnosis of SARS-CoV-2 by  FDA under an Emergency Use Authorization (EUA). This EUA will remain  in effect (meaning this test can be used) for the duration of the  Covid-19 declaration under Section 564(b)(1) of the Act, 21  U.S.C. section 360bbb-3(b)(1), unless the authorization is  terminated or revoked. Performed at [facility], [address] [place], [place] [address]   Blood culture (routine x 2)     Status: Abnormal (Preliminary result)   Collection Time: 07/19/20  1:15 AM   Specimen: BLOOD  Result Value Ref Range Status   Specimen Description   Final    BLOOD LEFT ANTECUBITAL Performed at [facility], [address] [place], [place] [address]    Special Requests   Final    BOTTLES DRAWN AEROBIC AND ANAEROBIC Blood Culture adequate volume Performed at [facility], [address] [place], [place] [address]    Culture   Setup Time   Final    GRAM NEGATIVE RODS IN BOTH AEROBIC AND ANAEROBIC BOTTLES Organism ID to follow CRITICAL RESULT CALLED TO, READ BACK BY AND VERIFIED WITH: PHARMD E JACKSON 07/19/20 AT 2253 SK Performed at [facility], [place] [address] [place], [place] [address]    Culture ESCHERICHIA COLI (A)  Final   Report Status PENDING  Incomplete  Blood Culture ID Panel (Reflexed)     Status: Abnormal   Collection Time: 07/19/20  1:15 AM  Result Value Ref Range Status   Enterococcus faecalis NOT DETECTED NOT DETECTED Final   Enterococcus Faecium NOT DETECTED NOT DETECTED Final   Listeria monocytogenes NOT DETECTED  NOT DETECTED Final   Staphylococcus species NOT DETECTED NOT DETECTED Final   Staphylococcus aureus (BCID) NOT DETECTED NOT DETECTED Final   Staphylococcus epidermidis NOT DETECTED NOT DETECTED Final   Staphylococcus lugdunensis NOT DETECTED NOT DETECTED Final   Streptococcus species NOT DETECTED NOT DETECTED Final   Streptococcus agalactiae NOT DETECTED NOT DETECTED Final   Streptococcus pneumoniae NOT DETECTED NOT DETECTED Final   Streptococcus pyogenes NOT DETECTED NOT DETECTED Final   A.calcoaceticus-baumannii NOT DETECTED NOT DETECTED Final   Bacteroides fragilis NOT DETECTED NOT DETECTED Final   Enterobacterales DETECTED (A) NOT DETECTED Final    Comment: Enterobacterales represent a large order of gram negative bacteria, not a single organism. CRITICAL RESULT CALLED TO, READ BACK BY AND VERIFIED WITH: PHARMD E JACKSON 07/19/20 AT 2253 SK    Enterobacter cloacae complex NOT DETECTED NOT DETECTED Final   Escherichia coli DETECTED (A) NOT DETECTED Final    Comment: CRITICAL RESULT CALLED TO, READ BACK BY AND VERIFIED WITH: PHARMD E JACKSON 07/19/20 AT 2253 SK    Klebsiella aerogenes NOT DETECTED NOT DETECTED Final   Klebsiella oxytoca NOT DETECTED NOT DETECTED Final   Klebsiella pneumoniae NOT DETECTED NOT DETECTED Final   Proteus species NOT DETECTED NOT DETECTED  Final   Salmonella species NOT DETECTED NOT DETECTED Final   Serratia marcescens NOT DETECTED NOT DETECTED Final   Haemophilus influenzae NOT DETECTED NOT DETECTED Final   Neisseria meningitidis NOT DETECTED NOT DETECTED Final   Pseudomonas aeruginosa NOT DETECTED NOT DETECTED Final   Stenotrophomonas maltophilia NOT DETECTED NOT DETECTED Final   Candida albicans NOT DETECTED NOT DETECTED Final   Candida auris NOT DETECTED NOT DETECTED Final   Candida glabrata NOT DETECTED NOT DETECTED Final   Candida krusei NOT DETECTED NOT DETECTED Final   Candida parapsilosis NOT DETECTED NOT DETECTED Final   Candida tropicalis NOT DETECTED NOT DETECTED Final   Cryptococcus neoformans/gattii NOT DETECTED NOT DETECTED Final   CTX-M ESBL NOT DETECTED NOT DETECTED Final   Carbapenem resistance IMP NOT DETECTED NOT DETECTED Final   Carbapenem resistance KPC NOT DETECTED NOT DETECTED Final   Carbapenem resistance NDM NOT DETECTED NOT DETECTED Final   Carbapenem resist OXA 48 LIKE NOT DETECTED NOT DETECTED Final   Carbapenem resistance VIM NOT DETECTED NOT DETECTED Final    Comment: Performed at [facility], [address], [place], [place] [address]  Blood culture (routine x 2)     Status: None (Preliminary result)   Collection Time: 07/19/20  1:22 AM   Specimen: BLOOD  Result Value Ref Range Status   Specimen Description   Final    BLOOD RIGHT ANTECUBITAL Performed at [facility], [address] [place], [place] [address]    Special Requests   Final    BOTTLES DRAWN AEROBIC AND ANAEROBIC Blood Culture adequate volume Performed at [facility], [address] [place], [place] [address]    Culture  Setup Time   Final    AEROBIC BOTTLE ONLY GRAM NEGATIVE RODS CRITICAL VALUE NOTED.  VALUE IS CONSISTENT WITH PREVIOUSLY REPORTED AND CALLED VALUE. Performed at [facility], [place] [address], [place], [place] [address]    Culture GRAM NEGATIVE RODS  Final   Report  Status PENDING  Incomplete  MRSA PCR Screening     Status: None   Collection Time: 07/19/20  5:40 AM   Specimen: Nasal Mucosa; Nasopharyngeal  Result Value Ref Range Status   MRSA by PCR NEGATIVE  NEGATIVE Final    Comment:        The GeneXpert MRSA Assay (FDA approved for NASAL specimens only), is one component of a comprehensive MRSA colonization surveillance program. It is not intended to diagnose MRSA infection nor to guide or monitor treatment for MRSA infections. Performed at [facility], [place] [address], [place], [place] [address]   Culture, Urine     Status: None   Collection Time: 07/19/20 12:32 PM   Specimen: Urine, Clean Catch  Result Value Ref Range Status   Specimen Description   Final    URINE, CLEAN CATCH Performed at [facility], [place] [address], [place], [place] [address]    Special Requests   Final    NONE Performed at [facility], [place] [address], [place], [place] [address]    Culture   Final    NO GROWTH Performed at [facility], [place] [address], [place], [place] [address]    Report Status 07/20/2020 FINAL  Final     Scheduled Meds:  aspirin EC  81 mg Oral Daily   bisacodyl  10 mg Rectal Daily   Chlorhexidine Gluconate Cloth  6 each Topical Daily   citalopram  10 mg Oral Daily   enoxaparin (LOVENOX) injection  40 mg Subcutaneous Q24H   fluticasone  1 spray Each Nare Daily   fluticasone furoate-vilanterol  1 puff Inhalation Daily   insulin aspart  0-9 Units Subcutaneous TID WC   ipratropium-albuterol  3 mL Nebulization TID   mouth rinse  15 mL Mouth Rinse BID   methylPREDNISolone (SOLU-MEDROL) injection  60 mg Intravenous Q12H   metoprolol tartrate  25 mg Oral BID   multivitamin with minerals  1 tablet Oral Daily   pantoprazole  40 mg Oral Daily   Continuous Infusions:  azithromycin 500 mg (07/21/20 0536)   cefTRIAXone (ROCEPHIN)  IV Stopped (07/20/20 1359)      LOS: 2 days   Cherene Altes, MD [facility] Office  [phone] Pager - Text Page per Shea Evans  If 7PM-7AM, please contact night-coverage per Amion 07/21/2020, 7:51 AM

## 2020-07-22 DIAGNOSIS — J9601 Acute respiratory failure with hypoxia: Secondary | ICD-10-CM | POA: Diagnosis not present

## 2020-07-22 DIAGNOSIS — R079 Chest pain, unspecified: Secondary | ICD-10-CM | POA: Diagnosis not present

## 2020-07-22 DIAGNOSIS — R0789 Other chest pain: Secondary | ICD-10-CM | POA: Diagnosis not present

## 2020-07-22 LAB — GLUCOSE, CAPILLARY
Glucose-Capillary: 139 mg/dL — ABNORMAL HIGH (ref 70–99)
Glucose-Capillary: 110 mg/dL — ABNORMAL HIGH (ref 70–99)
Glucose-Capillary: 127 mg/dL — ABNORMAL HIGH (ref 70–99)
Glucose-Capillary: 138 mg/dL — ABNORMAL HIGH (ref 70–99)

## 2020-07-22 LAB — BASIC METABOLIC PANEL
Anion gap: 10 (ref 5–15)
BUN: 27 mg/dL — ABNORMAL HIGH (ref 8–23)
CO2: 24 mmol/L (ref 22–32)
Calcium: 9 mg/dL (ref 8.9–10.3)
Chloride: 103 mmol/L (ref 98–111)
Creatinine, Ser: 0.75 mg/dL (ref 0.61–1.24)
GFR, Estimated: >60 mL/min (ref >60)
Glucose, Bld: 119 mg/dL — ABNORMAL HIGH (ref 70–99)
Potassium: 4.2 mmol/L (ref 3.5–5.1)
Sodium: 137 mmol/L (ref 135–145)

## 2020-07-22 LAB — CBC
HCT: 40.4 % (ref 39.0–52.0)
Hemoglobin: 13.2 g/dL (ref 13.0–17.0)
MCH: 31.1 pg (ref 26.0–34.0)
MCHC: 32.7 g/dL (ref 30.0–36.0)
MCV: 95.3 fL (ref 80.0–100.0)
Platelets: 149 10*3/uL — ABNORMAL LOW (ref 150–400)
RBC: 4.24 MIL/uL (ref 4.22–5.81)
RDW: 13.8 % (ref 11.5–15.5)
WBC: 10.9 10*3/uL — ABNORMAL HIGH (ref 4.0–10.5)
nRBC: 0 % (ref 0.0–0.2)

## 2020-07-22 MED ORDER — IPRATROPIUM-ALBUTEROL 0.5-2.5 (3) MG/3ML IN SOLN
3.0000 mL | Freq: Two times a day (BID) | RESPIRATORY_TRACT | Status: DC
  Administered 2020-07-22 – 2020-07-23 (×2): 3 mL via RESPIRATORY_TRACT
  Filled 2020-07-22 (×2): qty 3

## 2020-07-22 MED ORDER — PREDNISONE 5 MG PO TABS
5.0000 mg | ORAL_TABLET | Freq: Every day | ORAL | Status: DC
  Administered 2020-07-23: 5 mg via ORAL
  Filled 2020-07-22: qty 1

## 2020-07-22 NOTE — Progress Notes (Signed)
NAME:  Tony [name], MRN:  [id number], DOB:  02-17-1941, LOS: 3 ADMISSION DATE:  07/18/2020, CONSULTATION DATE:  11/5 REFERRING MD:  Stark Jock, CHIEF COMPLAINT:  Chest and abdominal pain   Brief History   79 year old male presented to [facility] complaining of sudden onset chest and abdominal pain.  He was profoundly hypoxemic requiring 15 L nonrebreather.  Imaging essentially negative.  Transferred to [facility] hospital for ICU admission.  Past Medical History  hypertension, pacemaker, prostate cancer treated with radiation and androgen deprivation, AAA, and gastric outlet obstruction.  Prostate cancer initially in 2015 and relapse in 2017. Initially treated with radiation, but now on Zytiga, prednisone, and androgen deprivation with Eligard.  Significant Hospital Events   11/5 admit to [facility]-->pain gone O2 requirements all better after lasix. Working dx decompensated OSA/cor pulmonale and subsequent low flow state resulting in ischemic pain exacerbated by chronic PVD. Also prob element of sepsis (favoring UT source) delirium almost completely resolved.   Consults:    Procedures:    Significant Diagnostic Tests:  CTA chest dissection study >  4.6 cm ascending thoracic aortic aneurysm stable in appearance from the prior exam. 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 Diverticular change without diverticulitis; pulmonary parenchyma reviewed, emphysema, non-specific interstitial changes in bases 11/5 echo >>>  Micro Data:  Flu/COVID PCR 11/5 > neg Blood 11/5 > e coli  3/4 bottles  Antimicrobials:  Vancomycin 11/5 Cefepime 11/5 > 11/5  Ceftriaxone 11/5 >>  Azithro 11/5 >>    Interim history/subjective:  Pt reports feeling much better. Wife at bedside  Wife reports she will wake him up at night as she will notice he  has stopped breathing  Objective   Blood pressure (!) 155/94, pulse 61, temperature 97.8 F (36.6 C), temperature source Oral, resp. rate 17, height 5\' 9"  (1.753 m), weight 104.5 kg, SpO2 94 %.        Intake/Output Summary (Last 24 hours) at 07/22/2020 1205 Last data filed at 07/22/2020 0631 Gross per 24 hour  Intake 75 ml  Output --  Net 75 ml   Filed Weights   07/19/20 0500 07/20/20 0500 07/22/20 0500  Weight: 104 kg 104.8 kg 104.5 kg    Examination: General: adult male sitting up in bed in NAD  HEENT: MM pink/moist, anicteric Neuro: AAOx4, normal strength / moves all extremities  CV: s1s2 RRR, no m/r/g PULM: non-labored on RA, lungs bilaterally clear  GI: soft, bsx4 active  Extremities: warm/dry  Skin: no rashes or lesions  Resolved Hospital Problem list     Assessment & Plan:   Acute on chronic hypoxemic respiratory failure due to acute pulmonary edema > improved -await ECHO findings  -goal for negative balance  -monitor off O2, sat goal >88%  Centrilobular emphysema and non-specific fibrotic changes of lungs -f/u with Dr. Annamaria Boots as outpatient for sleep evaluation / follow up arranged -will need outpatient PFT, HRCT  -continue Breo + PRN albuterol while inpatient  -for discharge can go back to advair   Acute metabolic encephalopathy > resolved -supportive care, minimize all sedating medications  OSA likely -CPAP QHS while inpatient  -will need sleep study as above   E coli bacteremia> urinary source? Prostate related? -per [place] will sign off. Please call back if new needs arise.   Best practice:  Per [facility]  Labs   CBC: Recent Labs  Lab 07/18/20 2220 07/18/20 2220 07/19/20 0029 07/19/20 0558 07/20/20 0336 07/21/20 0627 07/22/20 0532  WBC 5.6  --   --  9.8 16.5* 13.7* 10.9*  HGB 14.4   < > 13.9 12.5* 13.3 13.3 13.2  HCT 44.3   < > 41.0 39.4 40.7 40.0 40.4  MCV 95.5  --   --  98.3 95.8 94.3 95.3  PLT 172  --   --  139* 153 161 149*   <  > = values in this interval not displayed.    Basic Metabolic Panel: Recent Labs  Lab 07/18/20 2220 07/18/20 2220 07/19/20 0029 07/19/20 0558 07/20/20 0336 07/21/20 0627 07/22/20 0532  NA 140   < > 139 135 137 137 137  K 3.6   < > 3.1* 3.4* 3.9 4.2 4.2  CL 102  --   --  98 100 103 103  CO2 25  --   --  20* 21* 23 24  GLUCOSE 127*  --   --  306* 170* 148* 119*  BUN 18  --   --  19 26* 33* 27*  CREATININE 0.91  --   --  1.32* 1.23 0.93 0.75  CALCIUM 9.2  --   --  8.5* 9.0 9.3 9.0  MG  --   --   --   --   --  2.3  --    < > = values in this interval not displayed.   GFR: Estimated Creatinine Clearance: 92.1 mL/min (by C-G formula based on SCr of 0.75 mg/dL). Recent Labs  Lab 07/18/20 2220 07/18/20 2346 07/19/20 0121 07/19/20 0558 07/20/20 0336 07/21/20 0627 07/22/20 0532  PROCALCITON  --   --   --  21.70  --   --   --   WBC   < >  --   --  9.8 16.5* 13.7* 10.9*  LATICACIDVEN  --  3.4* 3.2*  --   --   --   --    < > = values in this interval not displayed.    Liver Function Tests: Recent Labs  Lab 07/18/20 2220 07/19/20 0558 07/20/20 0336 07/21/20 0627  AST 883* 1,217* 494* 185*  ALT 317* 665* 527* 364*  ALKPHOS 101 104 109 115  BILITOT 2.2* 3.2* 4.7* 1.5*  PROT 7.3 6.2* 6.8 6.7  ALBUMIN 4.1 3.5 3.7 3.5   Recent Labs  Lab 07/18/20 2220  LIPASE 21   Recent Labs  Lab 07/19/20 0558  AMMONIA 25    ABG    Component Value Date/Time   PHART 7.400 07/19/2020 0029   PCO2ART 39.1 07/19/2020 0029   PO2ART 50 (L) 07/19/2020 0029   HCO3 24.2 07/19/2020 0029   TCO2 25 07/19/2020 0029   O2SAT 85.0 07/19/2020 0029     Coagulation Profile: Recent Labs  Lab 07/19/20 0558  INR 1.1    Cardiac Enzymes: Recent Labs  Lab 07/19/20 0558  CKTOTAL 57    HbA1C: Hemoglobin A1C  Date/Time Value Ref Range Status  05/21/2020 09:52 AM 5.6 4.0 - 5.6 % Final   Hgb A1c MFr Bld  Date/Time Value Ref Range Status  07/19/2020 05:58 AM 5.9 (H) 4.8 - 5.6 % Final     Comment:    (NOTE) Pre diabetes:          5.7%-6.4%  Diabetes:              >6.4%  Glycemic control for   <7.0% adults with  diabetes   02/13/2019 10:54 AM 6.2 4.6 - 6.5 % Final    Comment:    Glycemic Control Guidelines for People with Diabetes:Non Diabetic:  <6%Goal of Therapy: <7%Additional Action Suggested:  >8%     CBG: Recent Labs  Lab 07/21/20 0727 07/21/20 1152 07/21/20 1702 07/21/20 2138 07/22/20 0802  GLUCAP 140* 119* 139* 158* 139*     Critical care time: n/a    Noe Gens, MSN, NP-C, AGACNP-BC [facility] 07/22/2020, 3:13 PM   Please see [email or web address] for pager details.

## 2020-07-22 NOTE — TOC Initial Note (Addendum)
Transition of Care [facility]) - Initial/Assessment Note    Patient Details  Name: Tony [name] MRN: [id number] Date of Birth: 05-20-1941  Transition of Care [facility]) CM/SW Contact:    Dessa Phi, RN Phone Number: 07/22/2020, 1:18 PM  Clinical Narrative: From home w/spouse. Has rw. PT recc [facility] for HHPT.                 Expected Discharge Plan: [place] Barriers to Discharge: No Barriers Identified   Patient Goals and CMS Choice Patient states their goals for this hospitalization and ongoing recovery are:: go home CMS Medicare.gov Compare Post Acute Care list provided to:: Patient Choice offered to / list presented to : Patient  Expected Discharge Plan and Services Expected Discharge Plan: [place]   Discharge Planning Services: CM Consult Post Acute Care Choice: [place] arrangements for the past 2 months: Single Family Home                                      Prior Living Arrangements/Services Living arrangements for the past 2 months: Single Family Home Lives with:: Spouse Patient language and need for interpreter reviewed:: Yes Do you feel safe going back to the place where you live?: Yes      Need for Family Participation in Patient Care: No (Comment) Care giver support system in place?: Yes (comment) Current home services: DME Criminal Activity/Legal Involvement Pertinent to Current Situation/Hospitalization: No - Comment as needed  Activities of Daily Living Home Assistive Devices/Equipment: Cane (specify quad or straight), Walker (specify type), Dentures (specify type) ADL Screening (condition at time of admission) Patient's cognitive ability adequate to safely complete daily activities?: Yes Is the patient deaf or have difficulty hearing?: Yes Does the patient have difficulty seeing, even when wearing glasses/contacts?: No Does the patient have difficulty concentrating, remembering, or  making decisions?: Yes Patient able to express need for assistance with ADLs?: Yes Does the patient have difficulty dressing or bathing?: No Independently performs ADLs?: Yes (appropriate for developmental age) Does the patient have difficulty walking or climbing stairs?: Yes Weakness of Legs: Both Weakness of Arms/Hands: Both  Permission Sought/Granted Permission sought to share information with : Case Manager Permission granted to share information with : Yes, Verbal Permission Granted  Share Information with NAME: Case manager           Emotional Assessment Appearance:: Appears stated age Attitude/Demeanor/Rapport: Gracious Affect (typically observed): Accepting Orientation: : Oriented to Self, Oriented to Place, Oriented to  Time, Oriented to Situation Alcohol / Substance Use: Not Applicable Psych Involvement: No (comment)  Admission diagnosis:  Atypical chest pain [R07.89] Hypoxia [R09.02] 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HCC) [I71.4] Acute respiratory failure with hypoxia (HCC) [J96.01] Chest pain, unspecified type [R07.9] Patient Active Problem List   Diagnosis Date Noted  . Atypical chest pain   . Chest pain   . Hypoxia 07/19/2020  . Acute respiratory failure with hypoxia ([place]) 07/19/2020  . 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 Morbid obesity ([place]) 11/14/2019  . Prediabetes 11/14/2019  . Varicose veins of both lower extremities 05/17/2019  . Bilateral lower extremity edema 03/13/2019  . Hypokalemia 03/13/2019  . 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 SSS (sick sinus syndrome) ([place]) 06/14/2018  . 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 Coronary artery disease involving native coronary artery of native heart without angina pectoris 06/08/2018  . Sick sinus syndrome ([place]) 06/08/2018  .  Aneurysm of thoracic aorta ([place]) 02/03/2018  . 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 Emphysema lung ([place]) 02/03/2018  . 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 Acute appendicitis  01/21/2018  . Perforated appendicitis 01/21/2018  . Chronic fatigue 06/01/2017  . Essential hypertension, benign 07/09/2016  . 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[place]) 07/06/2016  . Coronary artery calcification seen on CAT scan 05/15/2016  . Anxiety disorder 04/08/2016  . Insomnia 04/08/2016  . 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 Tobacco use disorder 04/08/2016  . Hypercholesterolemia 04/08/2016  . Malignant neoplasm of prostate ([place]) 02/23/2014   PCP:  [name], [name], MD Pharmacy:   [id number] [place], [address] [place] [address] Phone: [phone] Fax: [phone]     Social Determinants of Health (SDOH) Interventions    Readmission Risk Interventions No flowsheet data found.

## 2020-07-22 NOTE — Care Management Important Message (Signed)
Important Message  Patient Details IM Letter given to the Patient Name: Tony [name] MRN: [id number] Date of Birth: [birth year]   Medicare Important Message Given:  Yes     Kerin Salen 07/22/2020, 12:30 PM

## 2020-07-22 NOTE — Progress Notes (Signed)
Tony [name]  CHY:[id number] DOB: 08/19/1941 DOA: 07/18/2020 PCP: [name], [name], MD    Brief Narrative:  79 year old with a history of HTN, pacemaker placement, prostate cancer, AAA, and GOO who presented to [facility] with complaints of acute onset of chest and abdominal pain.  He was found to be profoundly hypoxemic requiring 15 L nonrebreather support.  CXR was without evidence of a pulmonary infiltrate.  He was transferred to the [facility] ICU and admitted by [facility].  Work-up following his admission included a CTa chest which did not reveal any acute pathology.  He was found to have markedly elevated transaminases and a modestly elevated lactic acid as well as significant hypoxemia on ABG.  Clinical concern was for significant volume overload and with aggressive Lasix diuresis the patient improved significantly.  Significant Events:  11/5 admit to [facility] ICU 11/5 TTE EF 65-70% -no WMA -unable to comment on diastolic dysfunction -mild to moderate aortic valve sclerosis without stenosis -no evidence of interatrial shunt on bubble study 11/6 care transferred to [facility]  Antimicrobials:  Vancomycin 11/5 Cefepime 11/5 >  DVT prophylaxis: Lovenox  Subjective: Oxygen saturations remained at 92% or better when working with OT yesterday on room air, and again 89% or greater when ambulating with PT.  HH PT suggested by PT but no HH OT necessary.  Afebrile with stable vital signs.  Saturations 94% on room air.  Feeling much better overall.  Asking about resumption of Zytiga.  No chest pain or abdominal pain.  Good oral intake.  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  has rapidly improved with diuresis - PCCM suspects an element of undiagnosed OSA/OHS - slow steroid taper -continue bronchodilators -complete 5 days of antibiotic therapy for this indication  Acute decompensated diastolic CHF/cor pulmonale - pulmonary  edema No evidence of systolic dysfunction on TTE this admission -improved nicely with diuresis - possible element of cor pulmonale versus simple diastolic heart failure -net negative approximately 1.5 L this admission  Filed Weights   07/19/20 0500 07/20/20 0500 07/22/20 0500  Weight: 104 kg 104.8 kg 104.5 kg     E coli bacteremia 2/2 blood cultures  source not clear -urine culture no growth but UA was not accomplished - plan for 10-day course of antibiotic treatment - no evidence of a biliary source -clinically much improved/resolved  Chest/abdomen/leg pain Idiopathic -CTa chest abdomen pelvis without evident source - CK total unremarkable -sed rate negative -resolved -?related to hypoxia -consider outpatient eval for PAD when more stable  Sepsis POA - elevated procalcitonin - lactic acidosis Due to bacteremia -clinically sepsis has resolved  Acute metabolic encephalopathy Sudden onset reported per wife -improved with supplemental oxygen -likely due to hypoxia and acute critical illness/sepsis  Acute kidney injury Creatinine has normalized  Transaminitis congestive hepatopathy v/s shock liver versus primary hepatic infection - nonobstructive pattern less convincing for choledocholithiasis/stricture - no ductal dilatation on CT abdomen  Chronic atrial fibrillation/SSS status post PPM Stable   Hypokalemia Corrected with supplementation  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 [name] - resume full treatment when LFTs have further improved  Thoracic/abdominal aortic aneurysm Stable on imaging this admission  Hyperglycemia A1c 5.9 - likely due to chronic steroid use and acute illness  Code Status: FULL CODE Family Communication: Spoke with wife at bedside Status is: Inpatient  Remains inpatient appropriate because:Inpatient level of care appropriate due to severity of illness  Dispo: The patient is  from: Home              Anticipated d/c is to: unclear presently               Anticipated d/c date is: 3 days              Patient currently is not medically stable to d/c.   Consultants:  none  Objective: Blood pressure (!) 155/94, pulse 61, temperature 97.8 F (36.6 C), temperature source Oral, resp. rate 17, height 5\' 9"  (1.753 m), weight 104.5 kg, SpO2 94 %.  Intake/Output Summary (Last 24 hours) at 07/22/2020 0827 Last data filed at 07/22/2020 0631 Gross per 24 hour  Intake 315 ml  Output 1 ml  Net 314 ml   Filed Weights   07/19/20 0500 07/20/20 0500 07/22/20 0500  Weight: 104 kg 104.8 kg 104.5 kg    Examination: General: No acute respiratory distress -alert and oriented Lungs: fine bibasilar crackles -no wheezing -good air movement throughout Cardiovascular: Regular rate without murmur Abdomen: Overweight, soft, no rebound, bowel sounds positive, no ascites or appreciable mass Extremities: Trace bilateral lower extremity edema which is stable  CBC: Recent Labs  Lab 07/20/20 0336 07/21/20 0627 07/22/20 0532  WBC 16.5* 13.7* 10.9*  HGB 13.3 13.3 13.2  HCT 40.7 40.0 40.4  MCV 95.8 94.3 95.3  PLT 153 161 295*   Basic Metabolic Panel: Recent Labs  Lab 07/20/20 0336 07/21/20 0627 07/22/20 0532  NA 137 137 137  K 3.9 4.2 4.2  CL 100 103 103  CO2 21* 23 24  GLUCOSE 170* 148* 119*  BUN 26* 33* 27*  CREATININE 1.23 0.93 0.75  CALCIUM 9.0 9.3 9.0  MG  --  2.3  --    GFR: Estimated Creatinine Clearance: 92.1 mL/min (by C-G formula based on SCr of 0.75 mg/dL).  Liver Function Tests: Recent Labs  Lab 07/18/20 2220 07/19/20 0558 07/20/20 0336 07/21/20 0627  AST 883* 1,217* 494* 185*  ALT 317* 665* 527* 364*  ALKPHOS 101 104 109 115  BILITOT 2.2* 3.2* 4.7* 1.5*  PROT 7.3 6.2* 6.8 6.7  ALBUMIN 4.1 3.5 3.7 3.5   Recent Labs  Lab 07/18/20 2220  LIPASE 21   Recent Labs  Lab 07/19/20 0558  AMMONIA 25    Coagulation Profile: Recent Labs  Lab  07/19/20 0558  INR 1.1    Cardiac Enzymes: Recent Labs  Lab 07/19/20 0558  CKTOTAL 57    HbA1C: Hemoglobin A1C  Date/Time Value Ref Range Status  05/21/2020 09:52 AM 5.6 4.0 - 5.6 % Final   Hgb A1c MFr Bld  Date/Time Value Ref Range Status  07/19/2020 05:58 AM 5.9 (H) 4.8 - 5.6 % Final    Comment:    (NOTE) Pre diabetes:          5.7%-6.4%  Diabetes:              >6.4%  Glycemic control for   <7.0% adults with diabetes   02/13/2019 10:54 AM 6.2 4.6 - 6.5 % Final    Comment:    Glycemic Control Guidelines for People with Diabetes:Non Diabetic:  <6%Goal of Therapy: <7%Additional Action Suggested:  >8%     CBG: Recent Labs  Lab 07/21/20 0727 07/21/20 1152 07/21/20 1702 07/21/20 2138 07/22/20 0802  GLUCAP 140* 119* 139* 158* 139*    Recent Results (from the past 240 hour(s))  Respiratory Panel by RT PCR (Flu A&B, Covid) - Nasopharyngeal Swab  Status: None   Collection Time: 07/18/20 11:46 PM   Specimen: Nasopharyngeal Swab  Result Value Ref Range Status   SARS Coronavirus 2 by RT PCR NEGATIVE NEGATIVE Final    Comment: (NOTE) SARS-CoV-2 target nucleic acids are NOT DETECTED.  The SARS-CoV-2 RNA is generally detectable in upper respiratoy specimens during the acute phase of infection. The lowest concentration of SARS-CoV-2 viral copies this assay can detect is 131 copies/mL. A negative result does not preclude SARS-Cov-2 infection and should not be used as the sole basis for treatment or other patient management decisions. A negative result may occur with  improper specimen collection/handling, submission of specimen other than nasopharyngeal swab, presence of viral mutation(s) within the areas targeted by this assay, and inadequate number of viral copies (<131 copies/mL). A negative result must be combined with clinical observations, patient history, and epidemiological information. The expected result is Negative.  Fact Sheet for Patients:    PinkCheek.be  Fact Sheet for Healthcare Providers:  GravelBags.it  This test is no t yet approved or cleared by the [place] FDA and  has been authorized for detection and/or diagnosis of SARS-CoV-2 by FDA under an Emergency Use Authorization (EUA). This EUA will remain  in effect (meaning this test can be used) for the duration of the COVID-19 declaration under Section 564(b)(1) of the Act, 21 U.S.C. section 360bbb-3(b)(1), unless the authorization is terminated or revoked sooner.     Influenza A by PCR NEGATIVE NEGATIVE Final   Influenza B by PCR NEGATIVE NEGATIVE Final    Comment: (NOTE) The Xpert Xpress SARS-CoV-2/FLU/RSV assay is intended as an aid in  the diagnosis of influenza from Nasopharyngeal swab specimens and  should not be used as a sole basis for treatment. Nasal washings and  aspirates are unacceptable for Xpert Xpress SARS-CoV-2/FLU/RSV  testing.  Fact Sheet for Patients: PinkCheek.be  Fact Sheet for Healthcare Providers: GravelBags.it  This test is not yet approved or cleared by the [place] FDA and  has been authorized for detection and/or diagnosis of SARS-CoV-2 by  FDA under an Emergency Use Authorization (EUA). This EUA will remain  in effect (meaning this test can be used) for the duration of the  Covid-19 declaration under Section 564(b)(1) of the Act, 21  U.S.C. section 360bbb-3(b)(1), unless the authorization is  terminated or revoked. Performed at [facility], [address] [place], [place] [address]   Blood culture (routine x 2)     Status: Abnormal   Collection Time: 07/19/20  1:15 AM   Specimen: BLOOD  Result Value Ref Range Status   Specimen Description   Final    BLOOD LEFT ANTECUBITAL Performed at [facility], [address] [place], [place] [address]    Special Requests   Final     BOTTLES DRAWN AEROBIC AND ANAEROBIC Blood Culture adequate volume Performed at [facility], [address] [place], [place] [address]    Culture  Setup Time   Final    GRAM NEGATIVE RODS IN BOTH AEROBIC AND ANAEROBIC BOTTLES Organism ID to follow CRITICAL RESULT CALLED TO, READ BACK BY AND VERIFIED WITH: PHARMD E JACKSON 07/19/20 AT 2253 SK Performed at [facility], [place] [address], [place], [place] [address]    Culture ESCHERICHIA COLI (A)  Final   Report Status 07/21/2020 FINAL  Final   Organism ID, Bacteria ESCHERICHIA COLI  Final      Susceptibility   Escherichia coli - MIC*  AMPICILLIN >=32 RESISTANT Resistant     CEFAZOLIN 8 SENSITIVE Sensitive     CEFEPIME <=0.12 SENSITIVE Sensitive     CEFTAZIDIME <=1 SENSITIVE Sensitive     CEFTRIAXONE <=0.25 SENSITIVE Sensitive     CIPROFLOXACIN <=0.25 SENSITIVE Sensitive     GENTAMICIN <=1 SENSITIVE Sensitive     IMIPENEM <=0.25 SENSITIVE Sensitive     TRIMETH/SULFA <=20 SENSITIVE Sensitive     AMPICILLIN/SULBACTAM 16 INTERMEDIATE Intermediate     PIP/TAZO <=4 SENSITIVE Sensitive     * ESCHERICHIA COLI  Blood Culture ID Panel (Reflexed)     Status: Abnormal   Collection Time: 07/19/20  1:15 AM  Result Value Ref Range Status   Enterococcus faecalis NOT DETECTED NOT DETECTED Final   Enterococcus Faecium NOT DETECTED NOT DETECTED Final   Listeria monocytogenes NOT DETECTED NOT DETECTED Final   Staphylococcus species NOT DETECTED NOT DETECTED Final   Staphylococcus aureus (BCID) NOT DETECTED NOT DETECTED Final   Staphylococcus epidermidis NOT DETECTED NOT DETECTED Final   Staphylococcus lugdunensis NOT DETECTED NOT DETECTED Final   Streptococcus species NOT DETECTED NOT DETECTED Final   Streptococcus agalactiae NOT DETECTED NOT DETECTED Final   Streptococcus pneumoniae NOT DETECTED NOT DETECTED Final   Streptococcus pyogenes NOT DETECTED NOT DETECTED Final   A.calcoaceticus-baumannii NOT DETECTED NOT DETECTED Final    Bacteroides fragilis NOT DETECTED NOT DETECTED Final   Enterobacterales DETECTED (A) NOT DETECTED Final    Comment: Enterobacterales represent a large order of gram negative bacteria, not a single organism. CRITICAL RESULT CALLED TO, READ BACK BY AND VERIFIED WITH: PHARMD E JACKSON 07/19/20 AT 2253 SK    Enterobacter cloacae complex NOT DETECTED NOT DETECTED Final   Escherichia coli DETECTED (A) NOT DETECTED Final    Comment: CRITICAL RESULT CALLED TO, READ BACK BY AND VERIFIED WITH: PHARMD E JACKSON 07/19/20 AT 2253 SK    Klebsiella aerogenes NOT DETECTED NOT DETECTED Final   Klebsiella oxytoca NOT DETECTED NOT DETECTED Final   Klebsiella pneumoniae NOT DETECTED NOT DETECTED Final   Proteus species NOT DETECTED NOT DETECTED Final   Salmonella species NOT DETECTED NOT DETECTED Final   Serratia marcescens NOT DETECTED NOT DETECTED Final   Haemophilus influenzae NOT DETECTED NOT DETECTED Final   Neisseria meningitidis NOT DETECTED NOT DETECTED Final   Pseudomonas aeruginosa NOT DETECTED NOT DETECTED Final   Stenotrophomonas maltophilia NOT DETECTED NOT DETECTED Final   Candida albicans NOT DETECTED NOT DETECTED Final   Candida auris NOT DETECTED NOT DETECTED Final   Candida glabrata NOT DETECTED NOT DETECTED Final   Candida krusei NOT DETECTED NOT DETECTED Final   Candida parapsilosis NOT DETECTED NOT DETECTED Final   Candida tropicalis NOT DETECTED NOT DETECTED Final   Cryptococcus neoformans/gattii NOT DETECTED NOT DETECTED Final   CTX-M ESBL NOT DETECTED NOT DETECTED Final   Carbapenem resistance IMP NOT DETECTED NOT DETECTED Final   Carbapenem resistance KPC NOT DETECTED NOT DETECTED Final   Carbapenem resistance NDM NOT DETECTED NOT DETECTED Final   Carbapenem resist OXA 48 LIKE NOT DETECTED NOT DETECTED Final   Carbapenem resistance VIM NOT DETECTED NOT DETECTED Final    Comment: Performed at [facility], [address], [place], [place] [address]  Blood culture  (routine x 2)     Status: Abnormal   Collection Time: 07/19/20  1:22 AM   Specimen: BLOOD  Result Value Ref Range Status   Specimen Description   Final    BLOOD RIGHT ANTECUBITAL Performed at [facility],  [place], [place] [address]    Special Requests   Final    BOTTLES DRAWN AEROBIC AND ANAEROBIC Blood Culture adequate volume Performed at [facility], [address] [place], [place] [address]    Culture  Setup Time   Final    AEROBIC BOTTLE ONLY GRAM NEGATIVE RODS CRITICAL VALUE NOTED.  VALUE IS CONSISTENT WITH PREVIOUSLY REPORTED AND CALLED VALUE.    Culture (A)  Final    ESCHERICHIA COLI SUSCEPTIBILITIES PERFORMED ON PREVIOUS CULTURE WITHIN THE LAST 5 DAYS. Performed at [facility], [place] [address] [place], [place] [address]    Report Status 07/21/2020 FINAL  Final  MRSA PCR Screening     Status: None   Collection Time: 07/19/20  5:40 AM   Specimen: Nasal Mucosa; Nasopharyngeal  Result Value Ref Range Status   MRSA by PCR NEGATIVE NEGATIVE Final    Comment:        The GeneXpert MRSA Assay (FDA approved for NASAL specimens only), is one component of a comprehensive MRSA colonization surveillance program. It is not intended to diagnose MRSA infection nor to guide or monitor treatment for MRSA infections. Performed at [facility], [place] [address] [place], [place] [address]   Culture, Urine     Status: None   Collection Time: 07/19/20 12:32 PM   Specimen: Urine, Clean Catch  Result Value Ref Range Status   Specimen Description   Final    URINE, CLEAN CATCH Performed at [facility], [place] [address] [place], [place] [address]    Special Requests   Final    NONE Performed at [facility], [place] [address], [place], [place] [address]    Culture   Final    NO GROWTH Performed at [facility], [place] [address], [place],  [address]    Report Status  07/20/2020 FINAL  Final     Scheduled Meds: . aspirin EC  81 mg Oral Daily  . bisacodyl  10 mg Rectal Daily  . Chlorhexidine Gluconate Cloth  6 each Topical Daily  . citalopram  10 mg Oral Daily  . enoxaparin (LOVENOX) injection  40 mg Subcutaneous Q24H  . fluticasone  1 spray Each Nare Daily  . fluticasone furoate-vilanterol  1 puff Inhalation Daily  . insulin aspart  0-9 Units Subcutaneous TID WC  . ipratropium-albuterol  3 mL Nebulization TID  . mouth rinse  15 mL Mouth Rinse BID  . metoprolol tartrate  25 mg Oral BID  . multivitamin with minerals  1 tablet Oral Daily  . pantoprazole  40 mg Oral Daily  . predniSONE  60 mg Oral Q breakfast   Continuous Infusions: . azithromycin 500 mg (07/22/20 4403)  . cefTRIAXone (ROCEPHIN)  IV 2 g (07/21/20 1318)     LOS: 3 days   Cherene Altes, MD [facility] Office  [phone] Pager - Text Page per Amion  If 7PM-7AM, please contact night-coverage per Amion 07/22/2020, 8:27 AM

## 2020-07-23 ENCOUNTER — Other Ambulatory Visit: Payer: Self-pay | Admitting: Family Medicine

## 2020-07-23 DIAGNOSIS — R0902 Hypoxemia: Secondary | ICD-10-CM | POA: Diagnosis not present

## 2020-07-23 DIAGNOSIS — C61 Malignant neoplasm of prostate: Secondary | ICD-10-CM | POA: Diagnosis not present

## 2020-07-23 DIAGNOSIS — J439 Emphysema, unspecified: Secondary | ICD-10-CM

## 2020-07-23 DIAGNOSIS — R600 Localized edema: Secondary | ICD-10-CM

## 2020-07-23 DIAGNOSIS — R0789 Other chest pain: Secondary | ICD-10-CM | POA: Diagnosis not present

## 2020-07-23 LAB — COMPREHENSIVE METABOLIC PANEL
ALT: 211 U/L — ABNORMAL HIGH (ref 0–44)
AST: 83 U/L — ABNORMAL HIGH (ref 15–41)
Albumin: 3.4 g/dL — ABNORMAL LOW (ref 3.5–5.0)
Alkaline Phosphatase: 97 U/L (ref 38–126)
Anion gap: 8 (ref 5–15)
BUN: 21 mg/dL (ref 8–23)
CO2: 22 mmol/L (ref 22–32)
Calcium: 8.8 mg/dL — ABNORMAL LOW (ref 8.9–10.3)
Chloride: 105 mmol/L (ref 98–111)
Creatinine, Ser: 0.77 mg/dL (ref 0.61–1.24)
GFR, Estimated: >60 mL/min (ref >60)
Glucose, Bld: 92 mg/dL (ref 70–99)
Potassium: 3.7 mmol/L (ref 3.5–5.1)
Sodium: 135 mmol/L (ref 135–145)
Total Bilirubin: 1.2 mg/dL (ref 0.3–1.2)
Total Protein: 6.3 g/dL — ABNORMAL LOW (ref 6.5–8.1)

## 2020-07-23 LAB — GLUCOSE, CAPILLARY
Glucose-Capillary: 80 mg/dL (ref 70–99)
Glucose-Capillary: 83 mg/dL (ref 70–99)

## 2020-07-23 MED ORDER — SULFAMETHOXAZOLE-TRIMETHOPRIM 800-160 MG PO TABS
1.0000 | ORAL_TABLET | Freq: Two times a day (BID) | ORAL | 0 refills | Status: DC
Start: 2020-07-24 — End: 2020-07-23

## 2020-07-23 MED ORDER — SODIUM CHLORIDE 0.9 % IV SOLN
2.0000 g | Freq: Every day | INTRAVENOUS | Status: DC
  Administered 2020-07-23: 2 g via INTRAVENOUS
  Filled 2020-07-23: qty 2

## 2020-07-23 MED ORDER — ABIRATERONE ACETATE 250 MG PO TABS: 1000.0000 mg | ORAL_TABLET | ORAL | Status: DC

## 2020-07-23 MED ORDER — SULFAMETHOXAZOLE-TRIMETHOPRIM 800-160 MG PO TABS: 2.0000 | ORAL_TABLET | Freq: Two times a day (BID) | ORAL | Status: DC

## 2020-07-23 MED ORDER — FUROSEMIDE 20 MG PO TABS: 20.0000 mg | ORAL_TABLET | Freq: Every day | ORAL | 0 refills | Status: DC

## 2020-07-23 MED ORDER — SULFAMETHOXAZOLE-TRIMETHOPRIM 800-160 MG PO TABS: 2.0000 | ORAL_TABLET | Freq: Two times a day (BID) | ORAL | 0 refills | Status: DC

## 2020-07-23 MED ORDER — ATORVASTATIN CALCIUM 20 MG PO TABS
20.0000 mg | ORAL_TABLET | Freq: Every day | ORAL | Status: DC
Start: 2020-07-28 — End: 2020-09-16

## 2020-07-23 MED ORDER — SULFAMETHOXAZOLE-TRIMETHOPRIM 800-160 MG PO TABS: 1.0000 | ORAL_TABLET | Freq: Two times a day (BID) | ORAL | Status: DC

## 2020-07-23 NOTE — Evaluation (Signed)
Physical Therapy Evaluation Patient Details Name: Tony [name] MRN: [id number] DOB: 1941/04/12 Today's Date: 07/23/2020   History of Present Illness  79 year old with a history of HTN, pacemaker placement, prostate cancer, AAA, and GOO who presented to [facility] with complaints of acute onset of chest and abdominal pain. Patient found to be hypoxic and briefly requiring 15 L oxygen but without acute pahtology. Currently on 4L [place].    Clinical Impression  Patient making good progress with acute PT and ambulated ~200' today with min guard assist and cues for safe walker management. Sats ranged from 88-90% on RA with gait and dropped to a low of 87% with functional exercises. Pt recovered well to 92-95% on RA with 30 seconds rest. He was educated on benefits of HHPT and use of RW to improve balance with gait. Acute PT will continue to progress pt as able.    Follow Up Recommendations Home health PT;Supervision/Assistance - 24 hour    Equipment Recommendations  None recommended by PT    Recommendations for Other Services       Precautions / Restrictions Precautions Precautions: Fall Precaution Comments: monitor sats Restrictions Weight Bearing Restrictions: No      Mobility  Bed Mobility Overal bed mobility: Modified Independent             General bed mobility comments: pt OOB in recliner    Transfers Overall transfer level: Needs assistance Equipment used: None Transfers: Sit to/from Stand Sit to Stand: Min guard;Supervision         General transfer comment: no assist required, pt steady with rising to stand.   Ambulation/Gait Ambulation/Gait assistance: Min assist;Min guard Gait Distance (Feet): 200 Feet Assistive device: Rolling walker (2 wheeled) Gait Pattern/deviations: Step-through pattern;Decreased stride length Gait velocity: decr   General Gait Details: VC's required at start for safe management of RW, pt improved proximity to RW throughout gait. Pt  amb on RA and sats ranged from 88-90% with gait. Recovered to 93% within 30 seconds rest.   Stairs            Wheelchair Mobility    Modified Rankin (Stroke Patients Only)       Balance Overall balance assessment: Mild deficits observed, not formally tested                                           Pertinent Vitals/Pain Pain Assessment: No/denies pain    Home Living                        Prior Function                 Hand Dominance        Extremity/Trunk Assessment                Communication      Cognition Arousal/Alertness: Awake/alert Behavior During Therapy: WFL for tasks assessed/performed Overall Cognitive Status: Within Functional Limits for tasks assessed                                        General Comments      Exercises Other Exercises Other Exercises: 5 reps sit<>stand from recliner, no UE use for power up. cues for technique. Sats dropped to 87% and recovered to  93% within 30 seconds rest.   Assessment/Plan    PT Assessment    PT Problem List         PT Treatment Interventions      PT Goals (Current goals can be found in the Care Plan section)  Acute Rehab PT Goals Patient Stated Goal: to go home PT Goal Formulation: With patient Time For Goal Achievement: 08/04/20 Potential to Achieve Goals: Good    Frequency Min 3X/week   Barriers to discharge        Co-evaluation               AM-PAC PT "6 Clicks" Mobility  Outcome Measure Help needed turning from your back to your side while in a flat bed without using bedrails?: None Help needed moving from lying on your back to sitting on the side of a flat bed without using bedrails?: A Little Help needed moving to and from a bed to a chair (including a wheelchair)?: A Little Help needed standing up from a chair using your arms (e.g., wheelchair or bedside chair)?: A Little Help needed to walk in hospital room?: A  Little Help needed climbing 3-5 steps with a railing? : A Little 6 Click Score: 19    End of Session Equipment Utilized During Treatment: Gait belt Activity Tolerance: Patient tolerated treatment well Patient left: in chair;with call bell/phone within reach;with family/visitor present Nurse Communication: Mobility status PT Visit Diagnosis: Unsteadiness on feet (R26.81);Difficulty in walking, not elsewhere classified (R26.2)    Time: 9480-1655 PT Time Calculation (min) (ACUTE ONLY): 26 min   Charges:     PT Treatments $Gait Training: 8-22 mins $Therapeutic Exercise: 8-22 mins        Verner Mould, DPT Acute Rehabilitation Services  Office [phone] Pager [phone]  07/23/2020 12:23 PM

## 2020-07-23 NOTE — Discharge Instructions (Signed)
Bacteremia, Adult Bacteremia is the presence of bacteria in the blood. When bacteria enter the bloodstream, they can cause a life-threatening reaction called sepsis. Sepsis is a medical emergency. What are the causes? This condition is caused by bacteria that get into the blood. Bacteria can enter the blood from an infection, including:  A skin infection or injury, such as a burn or a cut.  A lung infection (pneumonia).  An infection in the stomach or intestines.  An infection in the bladder or urinary system (urinary tract infection).  A bacterial infection in another part of the body that spreads to the blood. Bacteria can also enter the blood during a dental or medical procedure, from bleeding gums, or through use of an unclean needle. What increases the risk? This condition is more likely to develop in children, older adults, and people who have:  A long-term (chronic) disease or condition like diabetes or chronic kidney failure.  An artificial joint or heart valve, or heart valve disease.  A tube inserted to treat a medical condition, such as a urinary catheter or IV.  A weak disease-fighting system (immune system).  Injected illegal drugs.  Been hospitalized for more than 10 days in a row. What are the signs or symptoms? Symptoms of this condition include:  Fever and chills.  Fast heartbeat and shortness of breath.  Dizziness, weakness, and low blood pressure.  Confusion or anxiety.  Pain in the abdomen, nausea, vomiting, and diarrhea. Bacteremia that has spread to other parts of the body may cause symptoms in those areas. In some cases, there are no symptoms. How is this diagnosed? This condition may be diagnosed with a physical exam and tests, such as:  Blood tests to check for bacteria (cultures) or other signs of infection.  Tests of any tubes that you have had inserted. These tests check for a source of infection.  Urine tests to check for bacteria in the  urine.  Imaging tests, such as an X-ray, a CT scan, an MRI, or a heart ultrasound. These check for a source of infection in other parts of your body, such as your lungs, heart valves, or joints. How is this treated? This condition is usually treated in the hospital. If you are treated at home, you may need to return to the hospital for medicines, blood tests, and evaluation. Treatment may include:  Antibiotic medicines. These may be given by mouth or directly into your blood through an IV. You may need antibiotics for several weeks. At first, you may be given an antibiotic to kill most types of blood bacteria. If tests show that a certain kind of bacteria is causing the problem, you may be given a different antibiotic.  IV fluids.  Removing any catheter or device that could be a source of infection.  Blood pressure and breathing support, if needed.  Surgery to control the source or the spread of infection, such as surgery to remove an implanted device, abscess, or infected tissue. Follow these instructions at home: Medicines  Take over-the-counter and prescription medicines only as told by your health care provider.  If you were prescribed an antibiotic medicine, take it as told by your health care provider. Do not stop taking the antibiotic even if you start to feel better. General instructions   Rest as needed. Ask your health care provider when you may return to normal activities.  Drink enough fluid to keep your urine pale yellow.  Do not use any products that contain nicotine or   tobacco, such as cigarettes, e-cigarettes, and chewing tobacco. If you need help quitting, ask your health care provider.  Keep all follow-up visits as told by your health care provider. This is important. How is this prevented?   Wash your hands regularly with soap and water. If soap and water are not available, use hand sanitizer.  You should wash your hands: ? After using the toilet or changing a  diaper. ? Before preparing, cooking, serving, or eating food. ? While caring for a sick person or while visiting someone in a hospital. ? Before and after changing bandages (dressings) over wounds.  Clean any scrapes or cuts with soap and water and cover them with a clean bandage.  Get vaccinations as recommended by your health care provider.  Practice good oral hygiene. Brush your teeth two times a day, and floss regularly.  Take good care of your skin. This includes bathing and moisturizing on a regular basis. Contact a health care provider if:  Your symptoms get worse, and medicines do not help.  You have severe pain. Get help right away if you have:  Pain.  A fever or chills.  Trouble breathing.  A fast heart rate.  Skin that is blotchy, pale, or clammy.  Confusion.  Weakness.  Lack of energy or unusual sleepiness.  New symptoms that develop after treatment has started. These symptoms may represent a serious problem that is an emergency. Do not wait to see if the symptoms will go away. Get medical help right away. Call your local emergency services (911 in the [place] Do not drive yourself to the hospital. Summary  Bacteremia is the presence of bacteria in the blood. When bacteria enter the bloodstream, they can cause a life-threatening reaction called sepsis.  Bacteremia is usually treated with antibiotic medicines in the hospital.  If you were prescribed an antibiotic medicine, take it as told by your health care provider. Do not stop taking the antibiotic even if you start to feel better.  Get help right away if you have any new symptoms that develop after treatment has started. This information is not intended to replace advice given to you by your health care provider. Make sure you discuss any questions you have with your health care provider. Document Revised: 01/20/2019 Document Reviewed: 01/20/2019 [employer] Patient Education  2020 [employer]  

## 2020-07-23 NOTE — Discharge Summary (Addendum)
DISCHARGE SUMMARY  Tony [name]  MR#: [id number]  DOB:[birth year]  Date of Admission: 07/18/2020 Date of Discharge: 07/23/2020  Attending Physician:Tony [name] Tony Duos, MD  Patient's WER:[id number], Tony So, MD  Consults:  Disposition: D/C home    Follow-up Appts:  Follow-up Information    Tony Lever, MD Follow up on 07/25/2020.   Specialty: Pulmonary Disease Why: Appt at 3:30 PM.  Please arrive at 3:15 for check in.  Appt is for sleep evaluation.  Contact information: [address] [place] [address] [phone]        [facility] Follow up.   Specialty: [place] Why: [facility] physical therapy Contact information: [place] [address] [phone]        [name], [name], MD Follow up in 7 day(s).   Specialty: Family Medicine Contact information: [place]  [address] [phone]        Croitoru, Dani Gobble, MD Follow up in 7 day(s).   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  He was found to be profoundly hypoxemic requiring 15 L nonrebreather support.  CXR was without evidence of a pulmonary infiltrate.  He was transferred to the  [facility], ICU and admitted by [facility].  Work-up following his admission included a CTa chest which did not reveal any acute pathology.  He was found to have markedly elevated transaminases and a modestly elevated lactic acid as well as significant hypoxemia on ABG.  Clinical concern was for significant volume overload and with aggressive Lasix diuresis the patient improved significantly.  Hospital Course:  Acute hypoxemic respiratory failure on suspected chronic hypoxic respiratory failure - UIP/emphysema  has rapidly improved with diuresis - PCCM suspects an element of undiagnosed OSA/OHS - steroid dosing tapered to usual dose prior to d/c -continue bronchodilators -completed 5 days of antibiotic therapy for this indication  Acute decompensated diastolic CHF/cor pulmonale - pulmonary edema No evidence of systolic dysfunction on TTE this admission -improved nicely with diuresis - possible element of cor pulmonale versus simple diastolic heart failure -net negative approximately 1.5 L this admission - returned to prior dose of diuretic at time of d/c   E coli bacteremia 2/2 blood cultures  urine culture no growth but UA was not accomplished - plan for 10-day course of antibiotic treatment - no evidence of a biliary source -clinically much improved/resolved - suspect began as a pyelo/UTI - to complete 10 days of abx total, with transition to oral at time of d/c   Chest/abdomen/leg pain Idiopathic -CTa chest abdomen pelvis without evident source - CK total unremarkable -sed rate negative -resolved -?related to hypoxia -consider outpatient eval for PAD when more stable  Severe Sepsis POA - elevated procalcitonin - lactic acidosis Due to bacteremia -clinically sepsis has resolved  Acute metabolic encephalopathy Sudden onset reported per wife -improved with supplemental oxygen -  likely due to hypoxia and acute critical illness/sepsis  Acute kidney injury Creatinine has  normalized  Transaminitis congestive hepatopathy v/s shock liver versus primary hepatic infection - nonobstructive pattern less convincing for choledocholithiasis/stricture - no ductal dilatation on CT abdomen  Chronic atrial fibrillation/SSS status post PPM Stable   Hypokalemia Corrected with supplementation  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 Alen [name] - resume full treatment when LFTs have further improved  Thoracic/abdominal aortic aneurysm Stable on imaging this admission  Hyperglycemia A1c 5.9 - likely due to chronic steroid use and acute illness   Allergies as of 07/23/2020      Reactions   Penicillins Rash   Has patient had a PCN reaction causing immediate rash, facial/tongue/throat swelling, SOB or lightheadedness with hypotension: Yes Has patient had a PCN reaction causing severe rash involving mucus membranes or skin necrosis: No Has patient had a PCN reaction that required hospitalization: No Has patient had a PCN reaction occurring within the last 10 years: No If all of the above answers are "NO", then may proceed with Cephalosporin use.      Medication List    TAKE these medications   abiraterone acetate 250 MG tablet Commonly known as: ZYTIGA Take 4 tablets (1,000 mg total) by mouth See admin instructions. Take 4 tablets (1000 mg) by mouth on an empty stomach 1 hour before or 2 hours after a meal Start taking on: July 24, 2020   acetaminophen 500 MG tablet Commonly known as: TYLENOL Take 1,000 mg by mouth every 6 (six) hours as needed for headache (pain).   albuterol 108 (90 Base) MCG/ACT inhaler Commonly known as: VENTOLIN HFA TAKE 2 PUFFS BY MOUTH EVERY 6 HOURS AS NEEDED FOR WHEEZE OR SHORTNESS OF BREATH What changed: See the new instructions.   amLODipine 2.5 MG tablet Commonly known as: NORVASC TAKE 1 TABLET BY MOUTH EVERY DAY   aspirin EC 81 MG tablet Take 81 mg  by mouth daily.   atorvastatin 20 MG tablet Commonly known as: LIPITOR Take 1 tablet (20 mg total) by mouth daily with supper. Start taking on: July 28, 2020 What changed:   when to take this  These instructions start on July 28, 2020. If you are unsure what to do until then, ask your doctor or other care provider.   carbamide peroxide 6.5 % OTIC solution Commonly known as: DEBROX Place 5 drops into both ears daily as needed (for earwax).   citalopram 10 MG tablet Commonly known as: CELEXA TAKE 1 TABLET BY MOUTH EVERY DAY   diclofenac sodium 1 % Gel Commonly known as: VOLTAREN Apply 4 [name] topically 4 (four) times daily. What changed:   when to take this  reasons to take this   docusate sodium 100 MG capsule Commonly known as: COLACE Take 100 mg by mouth daily with supper.   fluticasone 50 MCG/ACT nasal spray Commonly known as: FLONASE PLACE 1 SPRAY INTO BOTH NOSTRILS DAILY AS NEEDED FOR ALLERGIES OR RHINITIS (AT BEDTIME). What changed:   when to take this  reasons to take this   Fluticasone-Salmeterol 100-50 MCG/DOSE Aepb Commonly known as: ADVAIR Inhale 1 puff into the lungs 2 (two) times daily.   furosemide 20 MG tablet Commonly known as: LASIX Take 1 tablet (20 mg total) by mouth daily. What changed: See the new instructions.   LORazepam 1 MG tablet Commonly known as: ATIVAN TAKE 1 TABLET (1 MG TOTAL) BY MOUTH 2 (TWO) TIMES DAILY AS NEEDED.  What changed: when to take this   metoprolol tartrate 25 MG tablet Commonly known as: LOPRESSOR TAKE 1 TABLET BY MOUTH TWICE A DAY   multivitamin with minerals Tabs tablet Take 0.5 tablets by mouth every other day. Centrum   omeprazole 20 MG capsule Commonly known as: PRILOSEC TAKE 1 CAPSULE (20 MG TOTAL) BY MOUTH DAILY BEFORE BREAKFAST.   predniSONE 5 MG tablet Commonly known as: DELTASONE TAKE 1 TABLET BY MOUTH EVERY DAY WITH BREAKFAST What changed: See the new instructions.    sulfamethoxazole-trimethoprim 800-160 MG tablet Commonly known as: BACTRIM DS Take 2 tablets by mouth every 12 (twelve) hours. Start taking on: July 24, 2020   VISINE OP Place 1 drop into both eyes daily as needed (red eyes).   VITAMIN C PO Take 1 tablet by mouth daily.       Day of Discharge BP (!) 120/94 (BP Location: Right Arm)   Pulse 61   Temp 97.8 F (36.6 C) (Oral)   Resp 18   Ht 5\' 9"  (1.753 m)   Wt 104.5 kg   SpO2 95%   BMI 34.02 kg/m   Physical Exam: General: No acute respiratory distress Lungs: Clear to auscultation bilaterally without wheezes or crackles Cardiovascular: Regular rate and rhythm without murmur gallop or rub normal S1 and S2 Abdomen: Nontender, nondistended, soft, bowel sounds positive, no rebound, no ascites, no appreciable mass Extremities: Trace bilateral lower extremity edema  Basic Metabolic Panel: Recent Labs  Lab 07/19/20 0558 07/20/20 0336 07/21/20 0627 07/22/20 0532 07/23/20 0614  NA 135 137 137 137 135  K 3.4* 3.9 4.2 4.2 3.7  CL 98 100 103 103 105  CO2 20* 21* 23 24 22   GLUCOSE 306* 170* 148* 119* 92  BUN 19 26* 33* 27* 21  CREATININE 1.32* 1.23 0.93 0.75 0.77  CALCIUM 8.5* 9.0 9.3 9.0 8.8*  MG  --   --  2.3  --   --     Liver Function Tests: Recent Labs  Lab 07/18/20 2220 07/19/20 0558 07/20/20 0336 07/21/20 0627 07/23/20 0614  AST 883* 1,217* 494* 185* 83*  ALT 317* 665* 527* 364* 211*  ALKPHOS 101 104 109 115 97  BILITOT 2.2* 3.2* 4.7* 1.5* 1.2  PROT 7.3 6.2* 6.8 6.7 6.3*  ALBUMIN 4.1 3.5 3.7 3.5 3.4*   Recent Labs  Lab 07/18/20 2220  LIPASE 21   Recent Labs  Lab 07/19/20 0558  AMMONIA 25    Coags: Recent Labs  Lab 07/19/20 0558  INR 1.1    CBC: Recent Labs  Lab 07/18/20 2220 07/18/20 2220 07/19/20 0029 07/19/20 0558 07/20/20 0336 07/21/20 0627 07/22/20 0532  WBC 5.6  --   --  9.8 16.5* 13.7* 10.9*  HGB 14.4   < > 13.9 12.5* 13.3 13.3 13.2  HCT 44.3   < > 41.0 39.4 40.7 40.0  40.4  MCV 95.5  --   --  98.3 95.8 94.3 95.3  PLT 172  --   --  139* 153 161 149*   < > = values in this interval not displayed.    Cardiac Enzymes: Recent Labs  Lab 07/19/20 0558  CKTOTAL 57   BNP (last 3 results) Recent Labs    07/18/20 2220  BNP 130.6*    CBG: Recent Labs  Lab 07/22/20 1207 07/22/20 1642 07/22/20 2022 07/23/20 0744 07/23/20 1147  GLUCAP 110* 127* 138* 80 83    Recent Results (from the past 240 hour(s))  Respiratory Panel by RT PCR (Flu  A&B, Covid) - Nasopharyngeal Swab     Status: None   Collection Time: 07/18/20 11:46 PM   Specimen: Nasopharyngeal Swab  Result Value Ref Range Status   SARS Coronavirus 2 by RT PCR NEGATIVE NEGATIVE Final    Comment: (NOTE) SARS-CoV-2 target nucleic acids are NOT DETECTED.  The SARS-CoV-2 RNA is generally detectable in upper respiratoy specimens during the acute phase of infection. The lowest concentration of SARS-CoV-2 viral copies this assay can detect is 131 copies/mL. A negative result does not preclude SARS-Cov-2 infection and should not be used as the sole basis for treatment or other patient management decisions. A negative result may occur with  improper specimen collection/handling, submission of specimen other than nasopharyngeal swab, presence of viral mutation(s) within the areas targeted by this assay, and inadequate number of viral copies (<131 copies/mL). A negative result must be combined with clinical observations, patient history, and epidemiological information. The expected result is Negative.  Fact Sheet for Patients:  PinkCheek.be  Fact Sheet for Healthcare Providers:  GravelBags.it  This test is no t yet approved or cleared by the [place] FDA and  has been authorized for detection and/or diagnosis of SARS-CoV-2 by FDA under an Emergency Use Authorization (EUA). This EUA will remain  in effect (meaning this test can be  used) for the duration of the COVID-19 declaration under Section 564(b)(1) of the Act, 21 U.S.C. section 360bbb-3(b)(1), unless the authorization is terminated or revoked sooner.     Influenza A by PCR NEGATIVE NEGATIVE Final   Influenza B by PCR NEGATIVE NEGATIVE Final    Comment: (NOTE) The Xpert Xpress SARS-CoV-2/FLU/RSV assay is intended as an aid in  the diagnosis of influenza from Nasopharyngeal swab specimens and  should not be used as a sole basis for treatment. Nasal washings and  aspirates are unacceptable for Xpert Xpress SARS-CoV-2/FLU/RSV  testing.  Fact Sheet for Patients: PinkCheek.be  Fact Sheet for Healthcare Providers: GravelBags.it  This test is not yet approved or cleared by the [place] FDA and  has been authorized for detection and/or diagnosis of SARS-CoV-2 by  FDA under an Emergency Use Authorization (EUA). This EUA will remain  in effect (meaning this test can be used) for the duration of the  Covid-19 declaration under Section 564(b)(1) of the Act, 21  U.S.C. section 360bbb-3(b)(1), unless the authorization is  terminated or revoked. Performed at [facility], [address] [place], [place] [address]   Blood culture (routine x 2)     Status: Abnormal   Collection Time: 07/19/20  1:15 AM   Specimen: BLOOD  Result Value Ref Range Status   Specimen Description   Final    BLOOD LEFT ANTECUBITAL Performed at [facility], [address] [place], [place] [address]    Special Requests   Final    BOTTLES DRAWN AEROBIC AND ANAEROBIC Blood Culture adequate volume Performed at [facility], [address] [place], [place] [address]    Culture  Setup Time   Final    GRAM NEGATIVE RODS IN BOTH AEROBIC AND ANAEROBIC BOTTLES Organism ID to follow CRITICAL RESULT CALLED TO, READ BACK BY AND VERIFIED WITH: PHARMD E JACKSON 07/19/20 AT 2253 SK Performed at [facility], [place] [address], [place], [place] [address]    Culture ESCHERICHIA COLI (A)  Final   Report Status 07/21/2020 FINAL  Final   Organism ID, Bacteria ESCHERICHIA COLI  Final  Susceptibility   Escherichia coli - MIC*    AMPICILLIN >=32 RESISTANT Resistant     CEFAZOLIN 8 SENSITIVE Sensitive     CEFEPIME <=0.12 SENSITIVE Sensitive     CEFTAZIDIME <=1 SENSITIVE Sensitive     CEFTRIAXONE <=0.25 SENSITIVE Sensitive     CIPROFLOXACIN <=0.25 SENSITIVE Sensitive     GENTAMICIN <=1 SENSITIVE Sensitive     IMIPENEM <=0.25 SENSITIVE Sensitive     TRIMETH/SULFA <=20 SENSITIVE Sensitive     AMPICILLIN/SULBACTAM 16 INTERMEDIATE Intermediate     PIP/TAZO <=4 SENSITIVE Sensitive     * ESCHERICHIA COLI  Blood Culture ID Panel (Reflexed)     Status: Abnormal   Collection Time: 07/19/20  1:15 AM  Result Value Ref Range Status   Enterococcus faecalis NOT DETECTED NOT DETECTED Final   Enterococcus Faecium NOT DETECTED NOT DETECTED Final   Listeria monocytogenes NOT DETECTED NOT DETECTED Final   Staphylococcus species NOT DETECTED NOT DETECTED Final   Staphylococcus aureus (BCID) NOT DETECTED NOT DETECTED Final   Staphylococcus epidermidis NOT DETECTED NOT DETECTED Final   Staphylococcus lugdunensis NOT DETECTED NOT DETECTED Final   Streptococcus species NOT DETECTED NOT DETECTED Final   Streptococcus agalactiae NOT DETECTED NOT DETECTED Final   Streptococcus pneumoniae NOT DETECTED NOT DETECTED Final   Streptococcus pyogenes NOT DETECTED NOT DETECTED Final   A.calcoaceticus-baumannii NOT DETECTED NOT DETECTED Final   Bacteroides fragilis NOT DETECTED NOT DETECTED Final   Enterobacterales DETECTED (A) NOT DETECTED Final    Comment: Enterobacterales represent a large order of gram negative bacteria, not a single organism. CRITICAL RESULT CALLED TO, READ BACK BY AND VERIFIED WITH: PHARMD E JACKSON 07/19/20 AT 2253 SK    Enterobacter cloacae complex NOT DETECTED NOT DETECTED Final    Escherichia coli DETECTED (A) NOT DETECTED Final    Comment: CRITICAL RESULT CALLED TO, READ BACK BY AND VERIFIED WITH: PHARMD E JACKSON 07/19/20 AT 2253 SK    Klebsiella aerogenes NOT DETECTED NOT DETECTED Final   Klebsiella oxytoca NOT DETECTED NOT DETECTED Final   Klebsiella pneumoniae NOT DETECTED NOT DETECTED Final   Proteus species NOT DETECTED NOT DETECTED Final   Salmonella species NOT DETECTED NOT DETECTED Final   Serratia marcescens NOT DETECTED NOT DETECTED Final   Haemophilus influenzae NOT DETECTED NOT DETECTED Final   Neisseria meningitidis NOT DETECTED NOT DETECTED Final   Pseudomonas aeruginosa NOT DETECTED NOT DETECTED Final   Stenotrophomonas maltophilia NOT DETECTED NOT DETECTED Final   Candida albicans NOT DETECTED NOT DETECTED Final   Candida auris NOT DETECTED NOT DETECTED Final   Candida glabrata NOT DETECTED NOT DETECTED Final   Candida krusei NOT DETECTED NOT DETECTED Final   Candida parapsilosis NOT DETECTED NOT DETECTED Final   Candida tropicalis NOT DETECTED NOT DETECTED Final   Cryptococcus neoformans/gattii NOT DETECTED NOT DETECTED Final   CTX-M ESBL NOT DETECTED NOT DETECTED Final   Carbapenem resistance IMP NOT DETECTED NOT DETECTED Final   Carbapenem resistance KPC NOT DETECTED NOT DETECTED Final   Carbapenem resistance NDM NOT DETECTED NOT DETECTED Final   Carbapenem resist OXA 48 LIKE NOT DETECTED NOT DETECTED Final   Carbapenem resistance VIM NOT DETECTED NOT DETECTED Final    Comment: Performed at [facility], [address] [place], [place] [address]  Blood culture (routine x 2)     Status: Abnormal   Collection Time: 07/19/20  1:22 AM   Specimen: BLOOD  Result Value Ref Range Status   Specimen Description   Final  BLOOD RIGHT ANTECUBITAL Performed at [facility], [address] [place], [place] [address]    Special Requests   Final    BOTTLES DRAWN AEROBIC AND ANAEROBIC Blood Culture adequate volume Performed at [facility], [address] [place], [place] [address]    Culture  Setup Time   Final    AEROBIC BOTTLE ONLY GRAM NEGATIVE RODS CRITICAL VALUE NOTED.  VALUE IS CONSISTENT WITH PREVIOUSLY REPORTED AND CALLED VALUE.    Culture (A)  Final    ESCHERICHIA COLI SUSCEPTIBILITIES PERFORMED ON PREVIOUS CULTURE WITHIN THE LAST 5 DAYS. Performed at [facility], [place] [address] [place], [place] [address]    Report Status 07/21/2020 FINAL  Final  MRSA PCR Screening     Status: None   Collection Time: 07/19/20  5:40 AM   Specimen: Nasal Mucosa; Nasopharyngeal  Result Value Ref Range Status   MRSA by PCR NEGATIVE NEGATIVE Final    Comment:        The GeneXpert MRSA Assay (FDA approved for NASAL specimens only), is one component of a comprehensive MRSA colonization surveillance program. It is not intended to diagnose MRSA infection nor to guide or monitor treatment for MRSA infections. Performed at [facility], [place] [address] [place], [place] [address]   Culture, Urine     Status: None   Collection Time: 07/19/20 12:32 PM   Specimen: Urine, Clean Catch  Result Value Ref Range Status   Specimen Description   Final    URINE, CLEAN CATCH Performed at [facility], [place] [address] [place], [place] [address]    Special Requests   Final    NONE Performed at [facility], [place] [address], [place], [place] [address]    Culture   Final    NO GROWTH Performed at [facility], [place] [address], [place], [place] [address]    Report Status 07/20/2020 FINAL  Final      Time spent in discharge (includes decision making & examination of pt): 35 minutes  07/23/2020, 2:00 PM   Cherene Altes, MD [facility] Office  [phone]

## 2020-07-24 ENCOUNTER — Telehealth: Payer: Self-pay

## 2020-07-24 ENCOUNTER — Other Ambulatory Visit: Payer: Self-pay | Admitting: Oncology

## 2020-07-24 DIAGNOSIS — I10 Essential (primary) hypertension: Secondary | ICD-10-CM | POA: Diagnosis not present

## 2020-07-24 DIAGNOSIS — Z79899 Other long term (current) drug therapy: Secondary | ICD-10-CM | POA: Diagnosis not present

## 2020-07-24 DIAGNOSIS — J439 Emphysema, unspecified: Secondary | ICD-10-CM | POA: Diagnosis not present

## 2020-07-24 DIAGNOSIS — R0789 Other chest pain: Secondary | ICD-10-CM | POA: Diagnosis not present

## 2020-07-24 DIAGNOSIS — Z87891 Personal history of nicotine dependence: Secondary | ICD-10-CM | POA: Diagnosis not present

## 2020-07-24 DIAGNOSIS — I714 Abdominal aortic aneurysm, without rupture: Secondary | ICD-10-CM | POA: Diagnosis not present

## 2020-07-24 DIAGNOSIS — C61 Malignant neoplasm of prostate: Secondary | ICD-10-CM

## 2020-07-24 DIAGNOSIS — M6281 Muscle weakness (generalized): Secondary | ICD-10-CM | POA: Diagnosis not present

## 2020-07-24 DIAGNOSIS — I2510 Atherosclerotic heart disease of native coronary artery without angina pectoris: Secondary | ICD-10-CM | POA: Diagnosis not present

## 2020-07-24 DIAGNOSIS — J9601 Acute respiratory failure with hypoxia: Secondary | ICD-10-CM | POA: Diagnosis not present

## 2020-07-24 NOTE — Telephone Encounter (Signed)
Transition Care Management Unsuccessful Follow-up Telephone Call  Date of discharge and from where:  07/23/20 from [facility]  Attempts:  1st Attempt  Reason for unsuccessful TCM follow-up call:  Left voice message

## 2020-07-24 NOTE — Progress Notes (Signed)
07/25/20- 85 yoM former smoker seen at request of Noe Gens, NP for sleep evaluation and hosp f/u. Hosp 11/4-11/9  Acute Hypoxemic Respiratory Failure, UIP, Emphysema, dCHF w pulmonary edema, E. Coli bacteremia, Chronic AFib/ Pacemaker, Prostate Cancer, Aortic Aneurysm. He improved with diuresis. Alfredo Martinez, NP refers for sleep evaluation but also notes intention that patient have PFT and HRCT Epwoirth score- 14 Body weight today- Covid vax- 3Phizer Flu vax- had -----pt states snores, and stops breathing Wife here,confirming snoring, witnessed apneas and daytime somnolence. No hx ENT surgery. No sleep med. 2 cups AM coffee. No significant parasomnias.  Since recent hosp with diuresis he has felt much improd with less edema, nocturia only x 1 now, little cough or wheeze. No home O2.   Prior to Admission medications   Medication Sig Start Date End Date Taking? Authorizing Provider  abiraterone acetate (ZYTIGA) 250 MG tablet TAKE 4 TABLETS (1,000 MG TOTAL) BY MOUTH DAILY. TAKE ON AN EMPTY STOMACH 1 HOUR BEFORE OR 2 HOURS AFTER A MEAL 07/24/20  Yes Wyatt Portela, MD  acetaminophen (TYLENOL) 500 MG tablet Take 1,000 mg by mouth every 6 (six) hours as needed for headache (pain).    Yes [provider]  albuterol (VENTOLIN HFA) 108 (90 Base) MCG/ACT inhaler TAKE 2 PUFFS BY MOUTH EVERY 6 HOURS AS NEEDED FOR WHEEZE OR SHORTNESS OF BREATH 07/23/20  Yes [name], [name], MD  amLODipine (NORVASC) 2.5 MG tablet TAKE 1 TABLET BY MOUTH EVERY DAY Patient taking differently: Take 2.5 mg by mouth daily.  06/03/20  Yes [name], [name], MD  Ascorbic Acid (VITAMIN C PO) Take 1 tablet by mouth daily.   Yes [provider]  aspirin EC 81 MG tablet Take 81 mg by mouth daily.   Yes [provider]  atorvastatin (LIPITOR) 20 MG tablet Take 1 tablet (20 mg total) by mouth daily with supper. 07/28/20  Yes Cherene Altes, MD  carbamide peroxide (DEBROX) 6.5 % OTIC solution Place 5 drops into both  ears daily as needed (for earwax).   Yes [provider]  citalopram (CELEXA) 10 MG tablet TAKE 1 TABLET BY MOUTH EVERY DAY Patient taking differently: Take 10 mg by mouth daily.  03/04/20  Yes [name], [name], MD  diclofenac sodium (VOLTAREN) 1 % GEL Apply 4 g topically 4 (four) times daily. Patient taking differently: Apply 4 g topically 4 (four) times daily as needed (pain).  02/13/19  Yes [name], [name], MD  docusate sodium (COLACE) 100 MG capsule Take 100 mg by mouth daily with supper.   Yes [provider]  fluticasone (FLONASE) 50 MCG/ACT nasal spray PLACE 1 SPRAY INTO BOTH NOSTRILS DAILY AS NEEDED FOR ALLERGIES OR RHINITIS (AT BEDTIME). Patient taking differently: Place 1 spray into both nostrils at bedtime as needed for allergies or rhinitis.  09/04/19  Yes [name], [name], MD  Fluticasone-Salmeterol (ADVAIR) 100-50 MCG/DOSE AEPB Inhale 1 puff into the lungs 2 (two) times daily. 11/14/19  Yes [name], [name], MD  furosemide (LASIX) 20 MG tablet Take 1 tablet (20 mg total) by mouth daily. 07/23/20  Yes Cherene Altes, MD  LORazepam (ATIVAN) 1 MG tablet TAKE 1 TABLET (1 MG TOTAL) BY MOUTH 2 (TWO) TIMES DAILY AS NEEDED. Patient taking differently: Take 1 mg by mouth 2 (two) times daily.  05/21/20  Yes [name], [name], MD  metoprolol tartrate (LOPRESSOR) 25 MG tablet TAKE 1 TABLET BY MOUTH TWICE A DAY Patient taking differently: Take 25 mg by mouth 2 (  two) times daily.  03/07/20  Yes Croitoru, Mihai, MD  Multiple Vitamin (MULTIVITAMIN WITH MINERALS) TABS tablet Take 0.5 tablets by mouth every other day. Centrum   Yes [provider]  omeprazole (PRILOSEC) 20 MG capsule TAKE 1 CAPSULE (20 MG TOTAL) BY MOUTH DAILY BEFORE BREAKFAST. 06/03/20  Yes [name], [name], MD  predniSONE (DELTASONE) 5 MG tablet TAKE 1 TABLET BY MOUTH EVERY DAY WITH BREAKFAST Patient taking differently: Take 5 mg by mouth daily with breakfast.  06/03/20  Yes Shadad, Mathis Dad, MD   sulfamethoxazole-trimethoprim (BACTRIM DS) 800-160 MG tablet Take 2 tablets by mouth every 12 (twelve) hours. 07/24/20  Yes Cherene Altes, MD  Tetrahydrozoline HCl (VISINE OP) Place 1 drop into both eyes daily as needed (red eyes).   Yes [provider]   Past Medical History:  Diagnosis Date  . Gastric outlet obstruction 02/03/2018   [name] 02/03/2018  . GERD (gastroesophageal reflux disease)   . History of hiatal hernia    small/notes 02/03/2018  . Hypertension    no meds  . Presence of permanent cardiac pacemaker   . Prostate cancer ([place]) 02/08/14   Gleason 4+5=9, PSA 15.65  . Radiation   . Sinus problem   . SSS (sick sinus syndrome) [facility])    Past Surgical History:  Procedure Laterality Date  . APPENDECTOMY  2019   hx hematoma s/p appendectomy  . ESOPHAGOGASTRODUODENOSCOPY (EGD) WITH PROPOFOL N/A 02/03/2018   Procedure: ESOPHAGOGASTRODUODENOSCOPY (EGD) WITH PROPOFOL;  Surgeon: Clarene Essex, MD;  Location: [place];  Service: Endoscopy;  Laterality: N/A;  . FRACTURE SURGERY    . INSERT / REPLACE / REMOVE PACEMAKER  06/14/2018  . LAPAROSCOPIC APPENDECTOMY N/A 01/21/2018   Procedure: APPENDECTOMY LAPAROSCOPIC;  Surgeon: Kinsinger, Arta Bruce, MD;  Location: [place];  Service: General;  Laterality: N/A;  . PACEMAKER IMPLANT N/A 06/14/2018   Procedure: PACEMAKER IMPLANT - Dual Chamber;  Surgeon: Sanda Klein, MD;  Location: [facility];  Service: Cardiovascular;  Laterality: N/A;  . PROSTATE BIOPSY  02/08/14   gleason 4+5=9, 12/12 cores positive, 54 gm  . RADIOACTIVE SEED IMPLANT N/A 06/29/2014   Procedure: RADIOACTIVE SEED IMPLANT;  Surgeon: Bernestine Amass, MD;  Location: [facility];  Service: Urology;  Laterality: N/A;  . WRIST FRACTURE SURGERY Right 1980s   Family History  Problem Relation Age of Onset  . Alzheimer's disease Mother   . Alzheimer's disease Father   . Cancer Father        prostate  . Arthritis Sister   . Cancer Brother         prostate   Social History   Socioeconomic History  . Marital status: Married    Spouse name: Not on file  . Number of children: 3  . Years of education: Not on file  . Highest education level: Not on file  Occupational History  . 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 Smoking status: Former Smoker    Years: 60.00    Types: Cigars    Quit date: 09/14/2012    Years since quitting: 7.8  . Smokeless tobacco: Never Used  . Tobacco comment: little cigars; the small ones"  Vaping Use  . Vaping Use: Never used  Substance and Sexual Activity  . Alcohol use: Yes    Alcohol/week: 14.0 standard drinks    Types: 14 Cans of beer per  week  . Drug use: No  . Sexual activity: Not Currently  Other Topics Concern  . Not on file  Social History Narrative   Lives with wife and 2 dogs in one level home; daughter and her family co-habitate.   Has three daughters, all in [place], supportive. Five grandchildren.   Wants to return to doing silver sneakers once gym re-opens.         Social Determinants of Health   Financial Resource Strain: Low Risk   . Difficulty of Paying Living Expenses: Not hard at all  Food Insecurity: No Food Insecurity  . Worried About Charity fundraiser in the Last Year: Never true  . Ran Out of Food in the Last Year: Never true  Transportation Needs: No Transportation Needs  . Lack of Transportation (Medical): No  . Lack of Transportation (Non-Medical): No  Physical Activity: Inactive  . Days of Exercise per Week: 0 days  . Minutes of Exercise per Session: 0 min  Stress: No Stress Concern Present  . Feeling of Stress : Not at all  Social Connections: Moderately Isolated  . Frequency of Communication with Friends and Family: Once a week  . Frequency of Social Gatherings with Friends and Family: Never  . Attends Religious Services: 1 to 4 times per year  . Active  Member of Clubs or Organizations: No  . Attends Archivist Meetings: Never  . Marital Status: Married  Human resources officer Violence: Not At Risk  . Fear of Current or Ex-Partner: No  . Emotionally Abused: No  . Physically Abused: No  . Sexually Abused: No   ROS-see HPI   + = positive Constitutional:    weight loss, night sweats, fevers, chills, fatigue, lassitude. HEENT:    headaches, difficulty swallowing, tooth/dental problems, sore throat,       sneezing, itching, ear ache, nasal congestion, post nasal drip, snoring CV:    chest pain, orthopnea, PND, swelling in lower extremities, anasarca,                                   dizziness, +palpitations Resp:   +shortness of breath with exertion or at rest.                productive cough,   non-productive cough, coughing up of blood.              change in color of mucus.  wheezing.   Skin:    rash or lesions. GI:  + heartburn, indigestion, abdominal pain, nausea, vomiting, diarrhea,                 change in bowel habits, loss of appetite GU: dysuria, change in color of urine, no urgency or frequency.   flank pain. MS:   joint pain, stiffness, decreased range of motion, back pain. Neuro-     nothing unusual Psych:  change in mood or affect.  depression or +anxiety.   memory loss.  OBJ- Physical Exam General- Alert, Oriented, Affect-appropriate, Distress- none acute, + obese Skin- rash-none, lesions- none, excoriation- none Lymphadenopathy- none Head- atraumatic            Eyes- Gross vision intact, PERRLA, conjunctivae and secretions clear            Ears- Hearing, canals-normal            Nose- Clear, no-Septal dev, mucus, polyps, erosion, perforation  Throat- Mallampati IV , mucosa clear , drainage- none, tonsils- atrophic, + teeth Neck- flexible , trachea midline, no stridor , thyroid nl, carotid no bruit Chest - symmetrical excursion , unlabored           Heart/CV- RRR , no murmur , no gallop  , no rub, nl  s1 s2                           - JVD- none , edema+trace, stasis changes- none, varices- none           Lung- +trace rales L base, wheeze- none, cough- none , dullness-none, rub- none           Chest wall- +pacemaker L  Abd-  Br/ Gen/ Rectal- Not done, not indicated Extrem- cyanosis- none, clubbing, none, atrophy- none, strength- nl Neuro- grossly intact to observation

## 2020-07-25 ENCOUNTER — Ambulatory Visit: Payer: Medicare Other | Admitting: Internal Medicine

## 2020-07-25 ENCOUNTER — Other Ambulatory Visit: Payer: Self-pay

## 2020-07-25 ENCOUNTER — Encounter: Payer: Self-pay | Admitting: Internal Medicine

## 2020-07-25 VITALS — BP 128/80 | HR 104 | Temp 97.9°F | Ht 69.0 in | Wt 228.0 lb

## 2020-07-25 DIAGNOSIS — J9611 Chronic respiratory failure with hypoxia: Secondary | ICD-10-CM

## 2020-07-25 DIAGNOSIS — R0683 Snoring: Secondary | ICD-10-CM

## 2020-07-25 DIAGNOSIS — J849 Interstitial pulmonary disease, unspecified: Secondary | ICD-10-CM

## 2020-07-25 DIAGNOSIS — J439 Emphysema, unspecified: Secondary | ICD-10-CM

## 2020-07-25 DIAGNOSIS — F17200 Nicotine dependence, unspecified, uncomplicated: Secondary | ICD-10-CM

## 2020-07-25 DIAGNOSIS — G4733 Obstructive sleep apnea (adult) (pediatric): Secondary | ICD-10-CM | POA: Insufficient documentation

## 2020-07-25 MED FILL — ABIRATERONE ACETATE 250 MG: 250 | 30 days supply | Qty: 120 | Fill #0

## 2020-07-25 NOTE — Assessment & Plan Note (Signed)
Probable OSA. Appropriate discussion and questions answered. He lives in [place], [address] [place], so we are going to try to manage with home sleep test.  Plan- sleep study then probably CPAP if appropriate

## 2020-07-25 NOTE — Assessment & Plan Note (Signed)
Reports long-term remission, quitting 15-20 yrs ago,

## 2020-07-25 NOTE — Telephone Encounter (Signed)
Transition Care Management Unsuccessful Follow-up Telephone Call  Date of discharge and from where:  07/23/2020 from [facility]  Attempts:  2nd Attempt  Reason for unsuccessful TCM follow-up call:  Left voice message

## 2020-07-25 NOTE — Assessment & Plan Note (Signed)
Seeking to clarify underlying pulmonary status after recent hosp, now that he has been diuresed, with question of ILD, COPD. Plan- as requested at discharge, will schedule PFT and HRCT

## 2020-07-25 NOTE — Patient Instructions (Signed)
Order- schedule PFT   Dx Chronic hypoxic respiratory failure  Order- schedule HRCT chest, no contrast   Dx Chronic hypoxic respiratory failure, ILD  Order- schedule Home sleep test    Dx snoring  Please call us about 2 weeks after your sleep test, to see if results and recommendations are ready yet. If appropriate, we may be able to start treatment before we see you next.

## 2020-07-26 ENCOUNTER — Encounter: Payer: Self-pay | Admitting: *Deleted

## 2020-07-26 ENCOUNTER — Other Ambulatory Visit: Payer: Self-pay | Admitting: *Deleted

## 2020-07-26 NOTE — Patient Outreach (Signed)
[facility]) Care Management [facility] CM Telephone Outreach, EMMI Red-Alert notification/ General Discharge PCP office completes Transition of Care follow up post-hospital discharge Post-hospital discharge day # 3 Unsuccessful initial outreach attempt- new referral  07/26/2020  Tony [name] Tony [name] 1941/08/31 [id number]  EMMI Red-Alert notification/ General Discharge EMMI call date/ day #: Thursday July 25, 2020; day # 1 Red-Alert reason(s): "questions about discharge papers;" "unfilled prescriptions"  Unsuccessful initial outreach attempt to [employer], 79 y/o male referred to [facility] RN CM this morning by [facility] CMA for EMMI red-Alert notification, as above.  Patient had recent hospitalization November 4-9, 2021 for chest/ abdominal pain; was diagnosed with sepsis, acute-on chronic respiratory failure with hypoxia and volume overload.  Patient was discharged home to self-care with home health services for PT through [facility].  Prior to most recent hospitalization, patient had no previous/ recent hospitalizations.  Patient has history including, but not limited to, prostate cancer; HTN; previous pacemaker; AAA; and GERD.  With today's call attempt, phone rang without physical or voice mail pick up; unable to leave patient HIPAA compliant voice mail message requesting return call back.  Plan:  Will place [facility] CM unsuccessful patient outreach letter in mail requesting call back in writing  Will re-attempt [facility] CM telephone outreach within 4 business days if I do not hear back from patient/ caregiver first  Oneta Rack, RN, BSN, [employer] Coordinator [facility] Care Management  [phone]

## 2020-07-29 DIAGNOSIS — I10 Essential (primary) hypertension: Secondary | ICD-10-CM | POA: Diagnosis not present

## 2020-07-29 DIAGNOSIS — R0789 Other chest pain: Secondary | ICD-10-CM | POA: Diagnosis not present

## 2020-07-29 DIAGNOSIS — Z79899 Other long term (current) drug therapy: Secondary | ICD-10-CM | POA: Diagnosis not present

## 2020-07-29 DIAGNOSIS — J9601 Acute respiratory failure with hypoxia: Secondary | ICD-10-CM | POA: Diagnosis not present

## 2020-07-29 DIAGNOSIS — M6281 Muscle weakness (generalized): Secondary | ICD-10-CM | POA: Diagnosis not present

## 2020-07-29 DIAGNOSIS — J439 Emphysema, unspecified: Secondary | ICD-10-CM | POA: Diagnosis not present

## 2020-07-29 DIAGNOSIS — Z87891 Personal history of nicotine dependence: Secondary | ICD-10-CM | POA: Diagnosis not present

## 2020-07-29 DIAGNOSIS — I2510 Atherosclerotic heart disease of native coronary artery without angina pectoris: Secondary | ICD-10-CM | POA: Diagnosis not present

## 2020-07-29 DIAGNOSIS — I714 Abdominal aortic aneurysm, without rupture: Secondary | ICD-10-CM | POA: Diagnosis not present

## 2020-07-30 ENCOUNTER — Other Ambulatory Visit: Payer: Self-pay | Admitting: *Deleted

## 2020-07-30 ENCOUNTER — Encounter: Payer: Self-pay | Admitting: *Deleted

## 2020-07-30 NOTE — Patient Outreach (Signed)
[facility]) Care Management [facility] CM Telephone Outreach, EMMI Red-Alert notification from 07/26/20 PCP office completes Transition of Care follow up post-hospital discharge Post-hospital discharge day # 7  07/30/2020  Tony [name] [facility] [birth year] [id number]  EMMI Red-Alert notification/ General Discharge EMMI call date/ day #: Thursday July 25, 2020; day # 1 Red-Alert reason(s): "questions about discharge papers;" "unfilled prescriptions"  Successful second outreach attempt to Tony [name], 79 y/o male referred to [facility] RN CM 07/26/20 by [facility] CMA for EMMI red-Alert notification, as above.  Patient had recent hospitalization November 4-9, 2021 for chest/ abdominal pain; was diagnosed with sepsis, acute-on chronic respiratory failure with hypoxia and volume overload.  Patient was discharged home to self-care with home health services for PT through [facility].  Prior to most recent hospitalization, patient had no previous/ recent hospitalizations.  Patient has history including, but not limited to, prostate cancer; HTN; previous pacemaker; AAA; and GERD.  HIPAA/ identity verified with [name], spouse of patient, verified on [facility] DPR; spouse reports she handles all of patient's medical affairs; purpose of call/ [facility] CM services/ program discussed with spouse, who provides verbal consent for ongoing participation in [place] program.  Screening for EMMI red-alert notification completed; spouse reports patient doing well post- recent hospital discharge; she denies current clinical concerns, current medication concerns.  Patient was recently discharged from hospital and all medications were reviewed with spouse today during phone call; no discrepancies or concerns identified as a result of medication review- spouse confirms that patient completed antibiotics as prescribed post-hospital discharge.  Confirms home health PT is active and patient is tolerating well.  Education  initiated around Materials engineer of newly disgnosed CHF; advised spouse to begin daily weight monitoring and recording at home, explaining rationale/ basic pathophysiology and general weight gain guidelines/ action plan for same.    Provider appointments reviewed with spouse who verbalizes accurate understanding of all with plans to attend all as scheduled along with patient; advised spouse to take recorded daily weights to upcoming cardiology provider appointment, she verbalizes understanding of same.  Caregiver denies further issues, concerns, or problems today.  I provided/ confirmed that caregiver and patient have my direct phone number, the main [facility] CM office phone number, and the [facility] CM 24-hour nurse advice phone number should issues arise prior to next scheduled [facility] CM outreach post-upcoming scheduled provider appointments.  Encouraged patient to contact me directly if needs, questions, issues, or concerns arise prior to next scheduled outreach; caregiver agreed to do so.  Plan:  Patient will take medications as prescribed and will attend all scheduled provider appointments  Patient will promptly notify care providers for any new concerns/ issues/ problems that arise  Patient will actively participate in home health services as ordered post-hospital discharge  Patient will begin monitoring/ recording daily weights   I will make patient's PCP aware of [facility] RN CM involvement in patient's care-- will send barriers letter  [facility] CM outreach to continue with scheduled phone call in 2 weeks, post-upcoming scheduled provider office visits for completion of initial assessment/ review of daily weights  Oneta Rack, RN, BSN, [employer] Coordinator [facility] Care Management  [phone]

## 2020-07-30 NOTE — Patient Instructions (Signed)
Goals Addressed            This Visit's Progress   . THN CM: Learn More About My Health       Follow Up Date 08/13/20   - tell my story and reason for my visit - make a list of questions - ask questions - repeat what I heard to make sure I understand - bring a list of my medicines to the visit - speak up when I don't understand    Why is this important?   The best way to learn about your health and care is by talking to the doctor and nurse.  They will answer your questions and give you information in the way that you like best.    Notes:     . THN CM: Make and Keep All Appointments       Follow Up Date 08/13/20   - ask family or friend for a ride - keep a calendar with appointment dates    Why is this important?   Part of staying healthy is seeing the doctor for follow-up care.  If you forget your appointments, there are some things you can do to stay on track.    Notes:   07/30/20: -- reviewed recent and upcoming provider appointments with patient's spouse/ caregiver- confirmed she and patient are aware of all scheduled appointments and have plans to attend as scheduled -- confirmed no transportation issues: spouse or family to transport; patient continues to drive self occasionally    . THN CM: [place] My Quality of Life       Follow Up Date 08/13/20   - complete a living will - discuss my treatment options with the doctor or nurse - make shared treatment decisions with doctor - name a health care proxy (decision maker)    Why is this important?   Having a long-term illness can be scary.  It can also be stressful for you and your caregiver.  These steps may help.    Notes:     . THN CM: Track and Manage Fluids and Swelling       Follow Up Date 08/13/20   - call office if I gain more than 2 pounds in one day or 5 pounds in one week: this is the earliest indicator of fluid retention - do ankle pumps when sitting - keep legs up while sitting - track weight  in diary-- or on a piece of paper or directly on your calendar: take with you to doctor office visits - use salt in moderation - watch for swelling in feet, ankles and legs every day - weigh myself daily-- at the same time each day, early into the morning after you get out of bed    Why is this important?   It is important to check your weight daily and watch how much salt and liquids you have.  It will help you to manage your heart failure.    Notes:   07/30/20: -- education initiated around importance of daily weight monitoring and recording at home    . THN CM: Track and Manage Symptoms       Follow Up Date 08/13/20    - develop a rescue plan - eat more whole grains, fruits and vegetables, lean meats and healthy fats - follow rescue plan if symptoms flare-up - know when to call the doctor - track symptoms and what helps feel better or worse - dress right for the weather, hot or  cold    Why is this important?   You will be able to handle your symptoms better if you keep track of them.  Making some simple changes to your lifestyle will help.  Eating healthy is one thing you can do to take good care of yourself.    Notes:        Heart Failure Action Plan A heart failure action plan helps you understand what to do when you have symptoms of heart failure. Follow the plan that was created by you and your health care provider. Review your plan each time you visit your health care provider. Red zone These signs and symptoms mean you should get medical help right away:  You have trouble breathing when resting.  You have a dry cough that is getting worse.  You have swelling or pain in your legs or abdomen that is getting worse.  You suddenly gain more than 2-3 lb (0.9-1.4 kg) in a day, or more than 5 lb (2.3 kg) in one week. This amount may be more or less depending on your condition.  You have trouble staying awake or you feel confused.  You have chest pain.  You do not  have an appetite.  You pass out. If you experience any of these symptoms:  Call your local emergency services (911 in the [place] right away or seek help at the emergency department of the nearest hospital. Yellow zone These signs and symptoms mean your condition may be getting worse and you should make some changes:  You have trouble breathing when you are active or you need to sleep with extra pillows.  You have swelling in your legs or abdomen.  You gain 2-3 lb (0.9-1.4 kg) in one day, or 5 lb (2.3 kg) in one week. This amount may be more or less depending on your condition.  You get tired easily.  You have trouble sleeping.  You have a dry cough. If you experience any of these symptoms:  Contact your health care provider within the next day.  Your health care provider may adjust your medicines. Green zone These signs mean you are doing well and can continue what you are doing:  You do not have shortness of breath.  You have very little swelling or no new swelling.  Your weight is stable (no gain or loss).  You have a normal activity level.  You do not have chest pain or any other new symptoms. Follow these instructions at home:  Take over-the-counter and prescription medicines only as told by your health care provider.  Weigh yourself daily. Your target weight is __________ lb (__________ kg). ? Call your health care provider if you gain more than __________ lb (__________ kg) in a day, or more than __________ lb (__________ kg) in one week.  Eat a heart-healthy diet. Work with a diet and nutrition specialist (dietitian) to create an eating plan that is best for you.  Keep all follow-up visits as told by your health care provider. This is important. Where to find more information  American Heart Association: www.heart.org Summary  Follow the action plan that was created by you and your health care provider.  Get help right away if you have any symptoms in the  Red zone. This information is not intended to replace advice given to you by your health care provider. Make sure you discuss any questions you have with your health care provider. Document Revised: 08/13/2017 Document Reviewed: 10/10/2016 Elsevier Patient Education  [phone]  [place].   Heart Failure, Diagnosis  Heart failure means that your heart is not able to pump blood in the right way. This makes it hard for your body to work well. Heart failure is usually a long-term (chronic) condition. You must take good care of yourself and follow your treatment plan from your doctor. What are the causes? This condition may be caused by:  High blood pressure.  Build up of cholesterol and fat in the arteries.  Heart attack. This injures the heart muscle.  Heart valves that do not open and close properly.  Damage of the heart muscle. This is also called cardiomyopathy.  Lung disease.  Abnormal heart rhythms. What increases the risk? The risk of heart failure goes up as a person ages. This condition is also more likely to develop in people who:  Are overweight.  Are male.  Smoke or chew tobacco.  Abuse alcohol or illegal drugs.  Have taken medicines that can damage the heart.  Have diabetes.  Have abnormal heart rhythms.  Have thyroid problems.  Have low blood counts (anemia). What are the signs or symptoms? Symptoms of this condition include:  Shortness of breath.  Coughing.  Swelling of the feet, ankles, legs, or belly.  Losing weight for no reason.  Trouble breathing.  Waking from sleep because of the need to sit up and get more air.  Rapid heartbeat.  Being very tired.  Feeling dizzy, or feeling like you may pass out (faint).  Having no desire to eat.  Feeling like you may vomit (nauseous).  Peeing (urinating) more at night.  Feeling confused. How is this treated?     This condition may be treated with:  Medicines. These can be given to treat  blood pressure and to make the heart muscles stronger.  Changes in your daily life. These may include eating a healthy diet, staying at a healthy body weight, quitting tobacco and illegal drug use, or doing exercises.  Surgery. Surgery can be done to open blocked valves, or to put devices in the heart, such as pacemakers.  A donor heart (heart transplant). You will receive a healthy heart from a donor. Follow these instructions at home:  Treat other conditions as told by your doctor. These may include high blood pressure, diabetes, thyroid disease, or abnormal heart rhythms.  Learn as much as you can about heart failure.  Get support as you need it.  Keep all follow-up visits as told by your doctor. This is important. Summary  Heart failure means that your heart is not able to pump blood in the right way.  This condition is caused by high blood pressure, heart attack, or damage of the heart muscle.  Symptoms of this condition include shortness of breath and swelling of the feet, ankles, legs, or belly. You may also feel very tired or feel like you may vomit.  You may be treated with medicines, surgery, or changes in your daily life.  Treat other health conditions as told by your doctor. This information is not intended to replace advice given to you by your health care provider. Make sure you discuss any questions you have with your health care provider. Document Revised: 11/18/2018 Document Reviewed: 11/18/2018 Elsevier Patient Education  [place].

## 2020-07-31 ENCOUNTER — Ambulatory Visit (INDEPENDENT_AMBULATORY_CARE_PROVIDER_SITE_OTHER): Payer: Medicare Other | Admitting: Family Medicine

## 2020-07-31 ENCOUNTER — Other Ambulatory Visit: Payer: Self-pay

## 2020-07-31 VITALS — BP 124/80 | HR 74 | Temp 98.6°F | Resp 16 | Ht 69.0 in | Wt 226.0 lb

## 2020-07-31 DIAGNOSIS — J439 Emphysema, unspecified: Secondary | ICD-10-CM | POA: Diagnosis not present

## 2020-07-31 DIAGNOSIS — E876 Hypokalemia: Secondary | ICD-10-CM

## 2020-07-31 DIAGNOSIS — R945 Abnormal results of liver function studies: Secondary | ICD-10-CM

## 2020-07-31 DIAGNOSIS — I10 Essential (primary) hypertension: Secondary | ICD-10-CM | POA: Diagnosis not present

## 2020-07-31 DIAGNOSIS — R7989 Other specified abnormal findings of blood chemistry: Secondary | ICD-10-CM

## 2020-07-31 DIAGNOSIS — Z23 Encounter for immunization: Secondary | ICD-10-CM | POA: Diagnosis not present

## 2020-07-31 NOTE — Patient Instructions (Signed)
A few things to remember from today's visit:  Essential hypertension, benign - Plan: CBC with Differential/Platelet  Hypokalemia  Pulmonary emphysema, unspecified emphysema type (HCC)  Abnormal liver function test - Plan: CBC with Differential/Platelet, COMPLETE METABOLIC PANEL WITH GFR  You are doing great! Continue PT. No changes in your meds. Keep next appt.  If you need refills please call your pharmacy. Do not use My Chart to request refills or for acute issues that need immediate attention.    Please be sure medication list is accurate. If a new problem present, please set up appointment sooner than planned today.

## 2020-07-31 NOTE — Progress Notes (Signed)
HPI: Tony [name] is a 79 y.o. male, who is here today with his wife to follow on recent hospitalization.  TCM 2 unsuccessful attempts 07/24/20. He was hospitalized from 11/4 to 07/23/2020. He presented to the ER c/o abdominal pain. In the ER he was found to be hypoxic. COPD/emphysema. He denies cough, SOB,and wheezing. He is not using Advair or Albuterol.  Acute hypoxemic respiratory failure attributed to CHF exacerbation. Echo 07/19/20: LVEF 95-28%, diastolic function could not be evaluated.  Admitted to the ICU. Sepsis: E. Coli bacteremia, BCx x 2  positive. UCx no growth.  Completed 5 days of abx treatment, Bactrim DS 2 tabs bid.  AKI: Improved during hospitalization. He has not noted decreased urine output,dysudia,foam in urine,or gross hematuria.  Component     Latest Ref Rng & Units 07/23/2020          Sodium     135 - 146 mmol/L 135  Potassium     3.5 - 5.3 mmol/L 3.7  Chloride     98 - 110 mmol/L 105  CO2     20 - 32 mmol/L 22  Glucose     65 - 99 mg/dL 92  BUN     7 - 25 mg/dL 21  Creatinine     0.70 - 1.18 mg/dL 0.77  Calcium     8.6 - 10.3 mg/dL 8.8 (L)  GFR, Estimated     >60 mL/min >60  Anion gap     5 - 15 8   CXR: No active disease. 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 Ascending thoracic aortic aneurysm. 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 Diverticular change without diverticulitis. No acute abnormality noted. Aortic Atherosclerosis (ICD10-I70.0) and Emphysema (ICD10-J43.9).  Clinical improvement with aggressive lasix diuresis and prednisone treatment (back to his usual dose, 5 mg daily). He is on Furosemide 20 mg daily, prior dose. HypoK++: Currected during hospitalization. Negative for worsening LE  edema.  HTN: He is on Metoprolol tartrate 25 mg bid and Amlodipine 2.5 mg daily. Negative for orthopnea or PND. Sick sinus synd, s/p pacemaker dual chamber. He follows with cardiologist. Thoracic/abdominal aortic aneurysm: Follows with Dr Cyndia Bent. He was already evaluated by pulmonologist, Dr Annamaria Boots. HH has ben arranged. PT 2 times per week,planning on once per week next visit. He is using a cane at home. No falls. He is not at baseline but progressively getting closer.  Abnormal LFT's. He has not noted abdominal pain,N/V,color changes of stool/urine,or jaundice. Abdominal CT: Did not show liver abnormalities or biliary dilation.  Lab Results  Component Value Date   ALT 211 (H) 07/23/2020   AST 83 (H) 07/23/2020   ALKPHOS 97 07/23/2020   BILITOT 1.2 07/23/2020   He has not noted fever,chills. Appetite has improved.  Lab Results  Component Value Date   WBC 10.9 (H) 07/22/2020   HGB 13.2 07/22/2020   HCT 40.4 07/22/2020   MCV 95.3 07/22/2020   PLT 149 (L) 07/22/2020   Review of Systems  Constitutional: Positive for fatigue. Negative for activity change, appetite change and fever.  HENT: Negative for nosebleeds, sore throat and trouble swallowing.   Eyes: Negative for redness and visual disturbance.  Cardiovascular: Negative for chest pain and palpitations.  Gastrointestinal: Negative for abdominal pain, nausea and vomiting.  Neurological: Negative for  syncope, facial asymmetry, weakness and headaches.  Psychiatric/Behavioral: Negative for confusion and hallucinations.  Rest see pertinent positives and negatives per HPI.  Current Outpatient Medications on File Prior to Visit  Medication Sig Dispense Refill  . abiraterone acetate (ZYTIGA) 250 MG tablet TAKE 4 TABLETS (1,000 MG TOTAL) BY MOUTH DAILY. TAKE ON AN EMPTY STOMACH 1 HOUR BEFORE OR 2 HOURS AFTER A MEAL 120 tablet 2  . acetaminophen (TYLENOL) 500 MG tablet Take 1,000 mg by mouth every 6 (six) hours as needed for  headache (pain).     [name] albuterol (VENTOLIN HFA) 108 (90 Base) MCG/ACT inhaler TAKE 2 PUFFS BY MOUTH EVERY 6 HOURS AS NEEDED FOR WHEEZE OR SHORTNESS OF BREATH 8.5 each 2  . amLODipine (NORVASC) 2.5 MG tablet TAKE 1 TABLET BY MOUTH EVERY DAY (Patient taking differently: Take 2.5 mg by mouth daily. ) 90 tablet 2  . Ascorbic Acid (VITAMIN C PO) Take 1 tablet by mouth daily.    [name] aspirin EC 81 MG tablet Take 81 mg by mouth daily.    [name] atorvastatin (LIPITOR) 20 MG tablet Take 1 tablet (20 mg total) by mouth daily with supper.    . carbamide peroxide (DEBROX) 6.5 % OTIC solution Place 5 drops into both ears daily as needed (for earwax).    . citalopram (CELEXA) 10 MG tablet TAKE 1 TABLET BY MOUTH EVERY DAY (Patient taking differently: Take 10 mg by mouth daily. ) 90 tablet 3  . diclofenac sodium (VOLTAREN) 1 % GEL Apply 4 g topically 4 (four) times daily. (Patient taking differently: Apply 4 g topically 4 (four) times daily as needed (pain). ) 4 Tube 3  . docusate sodium (COLACE) 100 MG capsule Take 100 mg by mouth daily with supper.    . fluticasone (FLONASE) 50 MCG/ACT nasal spray PLACE 1 SPRAY INTO BOTH NOSTRILS DAILY AS NEEDED FOR ALLERGIES OR RHINITIS (AT BEDTIME). (Patient taking differently: Place 1 spray into both nostrils at bedtime as needed for allergies or rhinitis. ) 48 mL 1  . Fluticasone-Salmeterol (ADVAIR) 100-50 MCG/DOSE AEPB Inhale 1 puff into the lungs 2 (two) times daily. 1 each 3  . furosemide (LASIX) 20 MG tablet Take 1 tablet (20 mg total) by mouth daily. 30 tablet 0  . LORazepam (ATIVAN) 1 MG tablet TAKE 1 TABLET (1 MG TOTAL) BY MOUTH 2 (TWO) TIMES DAILY AS NEEDED. (Patient taking differently: Take 1 mg by mouth 2 (two) times daily. ) 60 tablet 3  . metoprolol tartrate (LOPRESSOR) 25 MG tablet TAKE 1 TABLET BY MOUTH TWICE A DAY (Patient taking differently: Take 25 mg by mouth 2 (two) times daily. ) 180 tablet 2  . Multiple Vitamin (MULTIVITAMIN WITH MINERALS) TABS tablet Take 0.5  tablets by mouth every other day. Centrum    . omeprazole (PRILOSEC) 20 MG capsule TAKE 1 CAPSULE (20 MG TOTAL) BY MOUTH DAILY BEFORE BREAKFAST. 90 capsule 3  . predniSONE (DELTASONE) 5 MG tablet TAKE 1 TABLET BY MOUTH EVERY DAY WITH BREAKFAST (Patient taking differently: Take 5 mg by mouth daily with breakfast. ) 90 tablet 0  . sulfamethoxazole-trimethoprim (BACTRIM DS) 800-160 MG tablet Take 2 tablets by mouth every 12 (twelve) hours. 20 tablet 0  . Tetrahydrozoline HCl (VISINE OP) Place 1 drop into both eyes daily as needed (red eyes).     No current facility-administered medications on file prior to visit.   Past Medical History:  Diagnosis Date  . Gastric outlet obstruction 02/03/2018   [name] 02/03/2018  . GERD (gastroesophageal reflux  disease)   . History of hiatal hernia    small/notes 02/03/2018  . Hypertension    no meds  . Presence of permanent cardiac pacemaker   . 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 Radiation   . Sinus problem   . SSS (sick sinus syndrome) [facility])    Past Surgical History:  Procedure Laterality Date  . APPENDECTOMY  2019   hx hematoma s/p appendectomy  . ESOPHAGOGASTRODUODENOSCOPY (EGD) WITH PROPOFOL N/A 02/03/2018   Procedure: ESOPHAGOGASTRODUODENOSCOPY (EGD) WITH PROPOFOL;  Surgeon: Clarene Essex, MD;  Location: [facility];  Service: Endoscopy;  Laterality: N/A;  . FRACTURE SURGERY    . INSERT / REPLACE / REMOVE PACEMAKER  06/14/2018  . LAPAROSCOPIC APPENDECTOMY N/A 01/21/2018   Procedure: APPENDECTOMY LAPAROSCOPIC;  Surgeon: Kinsinger, Arta Bruce, MD;  Location: [facility];  Service: General;  Laterality: N/A;  . PACEMAKER IMPLANT N/A 06/14/2018   Procedure: PACEMAKER IMPLANT - Dual Chamber;  Surgeon: Sanda Klein, MD;  Location: [facility];  Service: Cardiovascular;  Laterality: N/A;  . PROSTATE BIOPSY  02/08/14   gleason 4+5=9, 12/12 cores positive, 54 gm  . RADIOACTIVE SEED IMPLANT N/A 06/29/2014   Procedure: RADIOACTIVE  SEED IMPLANT;  Surgeon: Bernestine Amass, MD;  [facility]: [facility];  Service: Urology;  Laterality: N/A;  . WRIST FRACTURE SURGERY Right 1980s    Allergies  Allergen Reactions  . Penicillins Rash    Has patient had a PCN reaction causing immediate rash, facial/tongue/throat swelling, SOB or lightheadedness with hypotension: Yes Has patient had a PCN reaction causing severe rash involving mucus membranes or skin necrosis: No Has patient had a PCN reaction that required hospitalization: No Has patient had a PCN reaction occurring within the last 10 years: No If all of the above answers are "NO", then may proceed with Cephalosporin use.    Social History   Socioeconomic History  . Marital status: Married    Spouse name: Not on file  . Number of children: 3  . Years of education: Not on file  . Highest education level: Not on file  Occupational History  . 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 Smoking status: Former Smoker    Years: 60.00    Types: Cigars    Quit date: 09/14/2012    Years since quitting: 7.8  . Smokeless tobacco: Never Used  . Tobacco comment: little cigars; the small ones"  Vaping Use  . Vaping Use: Never used  Substance and Sexual Activity  . Alcohol use: Yes    Alcohol/week: 14.0 standard drinks    Types: 14 Cans of beer per week  . Drug use: No  . Sexual activity: Not Currently  Other Topics Concern  . Not on file  Social History Narrative   Lives with wife and 2 dogs in one level home; daughter and her family co-habitate.   Has three daughters, all in [place], supportive. Five grandchildren.   Wants to return to doing silver sneakers once gym re-opens.         Social Determinants of Health   Financial Resource Strain: Low Risk   . Difficulty of Paying Living Expenses: Not hard at all  Food Insecurity: No Food Insecurity  . Worried About  Charity fundraiser in the Last Year: Never true  . Ran Out of Food in the Last  Year: Never true  Transportation Needs: No Transportation Needs  . Lack of Transportation (Medical): No  . Lack of Transportation (Non-Medical): No  Physical Activity: Inactive  . Days of Exercise per Week: 0 days  . Minutes of Exercise per Session: 0 min  Stress: No Stress Concern Present  . Feeling of Stress : Not at all  Social Connections: Moderately Isolated  . Frequency of Communication with Friends and Family: Once a week  . Frequency of Social Gatherings with Friends and Family: Never  . Attends Religious Services: 1 to 4 times per year  . Active Member of Clubs or Organizations: No  . Attends Archivist Meetings: Never  . Marital Status: Married    Vitals:   07/31/20 1212  BP: 124/80  Pulse: 74  Resp: 16  Temp: 98.6 F (37 C)  SpO2: 97%   Body mass index is 33.37 kg/m.  Physical Exam Vitals and nursing note reviewed.  Constitutional:      General: He is not in acute distress.    Appearance: He is well-developed.  HENT:     Head: Normocephalic and atraumatic.     Mouth/Throat:     Mouth: Mucous membranes are moist.     Pharynx: Oropharynx is clear.  Eyes:     Conjunctiva/sclera: Conjunctivae normal.  Cardiovascular:     Rate and Rhythm: Normal rate and regular rhythm.     Heart sounds: No murmur heard.      Comments: DP pulses present. Trace pitting LE edema, bilateral. Pulmonary:     Effort: Pulmonary effort is normal. No respiratory distress.     Breath sounds: Normal breath sounds.  Abdominal:     Palpations: Abdomen is soft. There is no hepatomegaly or mass.     Tenderness: There is no abdominal tenderness.  Lymphadenopathy:     Cervical: No cervical adenopathy.  Skin:    General: Skin is warm.     Findings: No erythema or rash.  Neurological:     General: No focal deficit present.     Mental Status: He is alert and oriented to person, place, and time.      Cranial Nerves: No cranial nerve deficit.     Comments: Mildly unstable gait, not assisted today.  Psychiatric:     Comments: Well groomed, good eye contact.    ASSESSMENT AND PLAN:  Mr. Kairo was seen today for hospitalization follow-up.  Diagnoses and all orders for this visit: Lab Results  Component Value Date   CREATININE 1.07 07/31/2020   BUN 18 07/31/2020   NA 138 07/31/2020   K 4.7 07/31/2020   CL 104 07/31/2020   CO2 24 07/31/2020   Lab Results  Component Value Date   WBC 10.5 07/31/2020   HGB 14.8 07/31/2020   HCT 43.4 07/31/2020   MCV 91.4 07/31/2020   PLT 230 07/31/2020   Essential hypertension, benign BP adequately controlled. Continue Metoprolol tartrate 25 mg bid and Amlodipine 2.5 mg daily. Low salt diet.  Hypokalemia Corrected during hospitalization. Not on K+ supplementation. He is back to low dose furosemide. Further recommendations according to BMP.  Pulmonary emphysema, unspecified emphysema type ([place]) He is no on supplemental O2. Following with pulmonologist. Pending sleep study at home to evaluate for OSA. ? ILD. Also pending PFT's.  Abnormal liver function test Asymptomatic. Further recommendations according to LFT's results.  Need for pneumococcal vaccination -     Pneumococcal polysaccharide vaccine 23-valent greater than or equal to 2yo subcutaneous/IM   Return  if symptoms worsen or fail to improve, for Keep next f/u appt.   Miyeko Mahlum G. [name], MD  [facility]. [place] office.  A few things to remember from today's visit:  Essential hypertension, benign - Plan: CBC with Differential/Platelet  Hypokalemia  Pulmonary emphysema, unspecified emphysema type (HCC)  Abnormal liver function test - Plan: CBC with Differential/Platelet, COMPLETE METABOLIC PANEL WITH GFR  You are doing great! Continue PT. No changes in your meds. Keep next appt.  If you need refills please call your pharmacy. Do not use My Chart  to request refills or for acute issues that need immediate attention.    Please be sure medication list is accurate. If a new problem present, please set up appointment sooner than planned today.

## 2020-08-01 DIAGNOSIS — R0789 Other chest pain: Secondary | ICD-10-CM | POA: Diagnosis not present

## 2020-08-01 DIAGNOSIS — I10 Essential (primary) hypertension: Secondary | ICD-10-CM | POA: Diagnosis not present

## 2020-08-01 DIAGNOSIS — Z87891 Personal history of nicotine dependence: Secondary | ICD-10-CM | POA: Diagnosis not present

## 2020-08-01 DIAGNOSIS — M6281 Muscle weakness (generalized): Secondary | ICD-10-CM | POA: Diagnosis not present

## 2020-08-01 DIAGNOSIS — J9601 Acute respiratory failure with hypoxia: Secondary | ICD-10-CM | POA: Diagnosis not present

## 2020-08-01 DIAGNOSIS — I714 Abdominal aortic aneurysm, without rupture: Secondary | ICD-10-CM | POA: Diagnosis not present

## 2020-08-01 DIAGNOSIS — J439 Emphysema, unspecified: Secondary | ICD-10-CM | POA: Diagnosis not present

## 2020-08-01 DIAGNOSIS — I2510 Atherosclerotic heart disease of native coronary artery without angina pectoris: Secondary | ICD-10-CM | POA: Diagnosis not present

## 2020-08-01 DIAGNOSIS — Z79899 Other long term (current) drug therapy: Secondary | ICD-10-CM | POA: Diagnosis not present

## 2020-08-01 LAB — COMPLETE METABOLIC PANEL WITH GFR
AG Ratio: 1.8 (calc) (ref 1.0–2.5)
ALT: 40 U/L (ref 9–46)
AST: 20 U/L (ref 10–35)
Albumin: 4.1 g/dL (ref 3.6–5.1)
Alkaline phosphatase (APISO): 97 U/L (ref 35–144)
BUN: 18 mg/dL (ref 7–25)
CO2: 24 mmol/L (ref 20–32)
Calcium: 9.3 mg/dL (ref 8.6–10.3)
Chloride: 104 mmol/L (ref 98–110)
Creat: 1.07 mg/dL (ref 0.70–1.18)
GFR, Est African American: 77 mL/min/{1.73_m2} (ref >=60)
GFR, Est Non African American: 66 mL/min/{1.73_m2} (ref >=60)
Globulin: 2.3 g/dL (calc) (ref 1.9–3.7)
Glucose, Bld: 114 mg/dL — ABNORMAL HIGH (ref 65–99)
Potassium: 4.7 mmol/L (ref 3.5–5.3)
Sodium: 138 mmol/L (ref 135–146)
Total Bilirubin: 0.9 mg/dL (ref 0.2–1.2)
Total Protein: 6.4 g/dL (ref 6.1–8.1)

## 2020-08-01 LAB — CBC WITH DIFFERENTIAL/PLATELET
Absolute Monocytes: 767 cells/uL (ref 200–950)
Basophils Absolute: 105 cells/uL (ref 0–200)
Basophils Relative: 1 %
Eosinophils Absolute: 95 cells/uL (ref 15–500)
Eosinophils Relative: 0.9 %
HCT: 43.4 % (ref 38.5–50.0)
Hemoglobin: 14.8 g/dL (ref 13.2–17.1)
Lymphs Abs: 1271 cells/uL (ref 850–3900)
MCH: 31.2 pg (ref 27.0–33.0)
MCHC: 34.1 g/dL (ref 32.0–36.0)
MCV: 91.4 fL (ref 80.0–100.0)
MPV: 11.9 fL (ref 7.5–12.5)
Monocytes Relative: 7.3 %
Neutro Abs: 8264 cells/uL — ABNORMAL HIGH (ref 1500–7800)
Neutrophils Relative %: 78.7 %
Platelets: 230 10*3/uL (ref 140–400)
RBC: 4.75 10*6/uL (ref 4.20–5.80)
RDW: 13 % (ref 11.0–15.0)
Total Lymphocyte: 12.1 %
WBC: 10.5 10*3/uL (ref 3.8–10.8)

## 2020-08-01 NOTE — Progress Notes (Signed)
Cardiology Clinic Note   Patient Name: Ehren Berisha [facility] Date of Encounter: 08/07/2020  Primary Care Provider:  [name], [name], MD Primary Cardiologist:  Sanda Klein, MD  Patient Profile    Zed Wanninger Leavell 79 year old male presents the clinic today for follow-up evaluation of his atypical chest pain.  Past Medical History    Past Medical History:  Diagnosis Date  . Gastric outlet obstruction 02/03/2018   [name] 02/03/2018  . GERD (gastroesophageal reflux disease)   . History of hiatal hernia    small/notes 02/03/2018  . Hypertension    no meds  . Presence of permanent cardiac pacemaker   . 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 Radiation   . Sinus problem   . SSS (sick sinus syndrome) [facility])    Past Surgical History:  Procedure Laterality Date  . APPENDECTOMY  2019   hx hematoma s/p appendectomy  . ESOPHAGOGASTRODUODENOSCOPY (EGD) WITH PROPOFOL N/A 02/03/2018   Procedure: ESOPHAGOGASTRODUODENOSCOPY (EGD) WITH PROPOFOL;  Surgeon: Clarene Essex, MD;  Location: [place];  Service: Endoscopy;  Laterality: N/A;  . FRACTURE SURGERY    . INSERT / REPLACE / REMOVE PACEMAKER  06/14/2018  . LAPAROSCOPIC APPENDECTOMY N/A 01/21/2018   Procedure: APPENDECTOMY LAPAROSCOPIC;  Surgeon: Kinsinger, Arta Bruce, MD;  Location: [place];  Service: General;  Laterality: N/A;  . PACEMAKER IMPLANT N/A 06/14/2018   Procedure: PACEMAKER IMPLANT - Dual Chamber;  Surgeon: Sanda Klein, MD;  Location: [place] [facility];  Service: Cardiovascular;  Laterality: N/A;  . PROSTATE BIOPSY  02/08/14   gleason 4+5=9, 12/12 cores positive, 54 gm  . RADIOACTIVE SEED IMPLANT N/A 06/29/2014   Procedure: RADIOACTIVE SEED IMPLANT;  Surgeon: Bernestine Amass, MD;  Location: [facility];  Service: Urology;  Laterality: N/A;  . WRIST FRACTURE SURGERY Right 1980s    Allergies  Allergies  Allergen Reactions  . Penicillins Rash    Has patient had a PCN reaction causing  immediate rash, facial/tongue/throat swelling, SOB or lightheadedness with hypotension: Yes Has patient had a PCN reaction causing severe rash involving mucus membranes or skin necrosis: No Has patient had a PCN reaction that required hospitalization: No Has patient had a PCN reaction occurring within the last 10 years: No If all of the above answers are "NO", then may proceed with Cephalosporin use.    History of Present Illness    Mr. Estevan Ryder has a PMH of acute hypoxic respiratory failure, emphysema, acute diastolic CHF, transaminase-itis acute kidney injury, chronic A. fib/sick sinus syndrome status post PPM, hypokalemia, thoracic/abdominal aortic aneurysm, and prediabetes.  He was last seen by Dr. Sallyanne Kuster on 09/18/2019.  At that time he was asymptomatic with his peripheral and coronary artery calcification that had been identified during his work-up for prostate cancer.  His AAA was incidentally diagnosed after he underwent a chest CT.  He received a dual-chamber PPM for sinus bradycardia with chronotropic incompetence 10/19.  On follow-up he denied chest pain, dyspnea orthopnea, PND, syncope, palpitations, claudication, lower extremity edema, and weight gain.  His AAA was stable measuring 3.6 cm and his ascending aortic aneurysm measured 4.2 cm.  He underwent repeat CT angio chest 07/18/2020 which showed an ascending aortic aneurysm of 4.6 cm and a measuring AAA 3.8 cm.  No dissection was identified.  He was admitted to the hospital 07/28/2020 until 07/23/2020.  He presented to [facility] with complaints of acute onset of chest and abdominal pain.  He was found to be  hypoxic and required 15 L nonrebreather.  Chest x-ray showed no evidence of pulmonary infiltrates.  He was transferred to [facility], ICU and admitted to [facility].  He also underwent chest CTA which did not show acute pathology.  He was found to have markedly elevated transaminases and a elevated lactic acid as well as significant  hypoxemia.  He was also significantly fluid volume overloaded.  He received aggressive IV diuresis and improved significantly.  His blood cultures showed E. coli bacterium and his urine cultures were negative for growth.  He received IV antibiotics and was transitioned to p.o. antibiotics at discharge.  He was noted to have idiopathic chest/abdomen/leg pain.  CTA chest abdomen pelvis was without evidence of source.  CK total was unremarkable, sed rate negative.  It was felt that his pain was related to his hypoxia.  It was recommended that he have an outpatient evaluation for peripheral arterial disease.  His atrial fibrillation/sick sinus syndrome remained stable throughout his admission.  He was discharged home in stable condition.  He presents to the clinic today for follow-up evaluation states he feels well today.  He has been increasing his physical activity slowly.  He states a home health nurse comes to his house and works with him on activities of daily living and exercises once per week for more than an hour.  He has been eating well and feels that his breathing is much better since he is returned home from the hospital.  He does mention that he had some ear and neck soreness for a few days last week.  He presented to the urgent care for evaluation and treatment.  His ear is much better today.  I will give him a salty 6 diet sheet, have him increase his physical activity as tolerated, and plan follow-up for 3 months.  Today he denies chest pain, shortness of breath, lower extremity edema, fatigue, palpitations, melena, hematuria, hemoptysis, diaphoresis, weakness, presyncope, syncope, orthopnea, and PND.   Home Medications    Prior to Admission medications   Medication Sig Start Date End Date Taking? Authorizing Provider  abiraterone acetate (ZYTIGA) 250 MG tablet TAKE 4 TABLETS (1,000 MG TOTAL) BY MOUTH DAILY. TAKE ON AN EMPTY STOMACH 1 HOUR BEFORE OR 2 HOURS AFTER A MEAL 07/24/20   Wyatt Portela, MD  acetaminophen (TYLENOL) 500 MG tablet Take 1,000 mg by mouth every 6 (six) hours as needed for headache (pain).     [provider]  albuterol (VENTOLIN HFA) 108 (90 Base) MCG/ACT inhaler TAKE 2 PUFFS BY MOUTH EVERY 6 HOURS AS NEEDED FOR WHEEZE OR SHORTNESS OF BREATH 07/23/20   [name], [name], MD  amLODipine (NORVASC) 2.5 MG tablet TAKE 1 TABLET BY MOUTH EVERY DAY Patient taking differently: Take 2.5 mg by mouth daily.  06/03/20   [name], [name], MD  Ascorbic Acid (VITAMIN C PO) Take 1 tablet by mouth daily.    [provider]  aspirin EC 81 MG tablet Take 81 mg by mouth daily.    [provider]  atorvastatin (LIPITOR) 20 MG tablet Take 1 tablet (20 mg total) by mouth daily with supper. 07/28/20   Cherene Altes, MD  carbamide peroxide (DEBROX) 6.5 % OTIC solution Place 5 drops into both ears daily as needed (for earwax).    [provider]  citalopram (CELEXA) 10 MG tablet TAKE 1 TABLET BY MOUTH EVERY DAY Patient taking differently: Take 10 mg by mouth daily.  03/04/20   [name], [name],  MD  diclofenac sodium (VOLTAREN) 1 % GEL Apply 4 g topically 4 (four) times daily. Patient taking differently: Apply 4 g topically 4 (four) times daily as needed (pain).  02/13/19   [name], [name], MD  docusate sodium (COLACE) 100 MG capsule Take 100 mg by mouth daily with supper.    [provider]  fluticasone (FLONASE) 50 MCG/ACT nasal spray PLACE 1 SPRAY INTO BOTH NOSTRILS DAILY AS NEEDED FOR ALLERGIES OR RHINITIS (AT BEDTIME). Patient taking differently: Place 1 spray into both nostrils at bedtime as needed for allergies or rhinitis.  09/04/19   [name], [name], MD  Fluticasone-Salmeterol (ADVAIR) 100-50 MCG/DOSE AEPB Inhale 1 puff into the lungs 2 (two) times daily. 11/14/19   [name], [name], MD  furosemide (LASIX) 20 MG tablet Take 1 tablet (20 mg total) by mouth daily. 07/23/20   Cherene Altes, MD  LORazepam (ATIVAN) 1 MG tablet TAKE 1 TABLET  (1 MG TOTAL) BY MOUTH 2 (TWO) TIMES DAILY AS NEEDED. Patient taking differently: Take 1 mg by mouth 2 (two) times daily.  05/21/20   [name], [name], MD  metoprolol tartrate (LOPRESSOR) 25 MG tablet TAKE 1 TABLET BY MOUTH TWICE A DAY Patient taking differently: Take 25 mg by mouth 2 (two) times daily.  03/07/20   Croitoru, Mihai, MD  Multiple Vitamin (MULTIVITAMIN WITH MINERALS) TABS tablet Take 0.5 tablets by mouth every other day. Centrum    [provider]  omeprazole (PRILOSEC) 20 MG capsule TAKE 1 CAPSULE (20 MG TOTAL) BY MOUTH DAILY BEFORE BREAKFAST. 06/03/20   [name], [name], MD  predniSONE (DELTASONE) 5 MG tablet TAKE 1 TABLET BY MOUTH EVERY DAY WITH BREAKFAST Patient taking differently: Take 5 mg by mouth daily with breakfast.  06/03/20   Wyatt Portela, MD  sulfamethoxazole-trimethoprim (BACTRIM DS) 800-160 MG tablet Take 2 tablets by mouth every 12 (twelve) hours. 07/24/20   Cherene Altes, MD  Tetrahydrozoline HCl (VISINE OP) Place 1 drop into both eyes daily as needed (red eyes).    [provider]    Family History    Family History  Problem Relation Age of Onset  . Alzheimer's disease Mother   . Alzheimer's disease Father   . Cancer Father        prostate  . Arthritis Sister   . Cancer Brother        prostate   He indicated that his mother is deceased. He indicated that his father is deceased. He indicated that his sister is alive. He indicated that his brother is alive.  Social History    Social History   Socioeconomic History  . Marital status: Married    Spouse name: Not on file  . Number of children: 3  . Years of education: Not on file  . Highest education level: Not on file  Occupational History  . 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 Smoking status: Former Smoker    Years: 60.00    Types: Cigars    Quit date: 09/14/2012    Years  since quitting: 7.9  . Smokeless tobacco: Never Used  . Tobacco comment: little cigars; the small ones"  Vaping Use  . Vaping Use: Never used  Substance and Sexual Activity  . Alcohol use: Yes    Alcohol/week: 14.0 standard drinks    Types: 14 Cans of beer per week  .  Drug use: No  . Sexual activity: Not Currently  Other Topics Concern  . Not on file  Social History Narrative   Lives with wife and 2 dogs in one level home; daughter and her family co-habitate.   Has three daughters, all in [place], supportive. Five grandchildren.   Wants to return to doing silver sneakers once gym re-opens.         Social Determinants of Health   Financial Resource Strain: Low Risk   . Difficulty of Paying Living Expenses: Not hard at all  Food Insecurity: No Food Insecurity  . Worried About Charity fundraiser in the Last Year: Never true  . Ran Out of Food in the Last Year: Never true  Transportation Needs: No Transportation Needs  . Lack of Transportation (Medical): No  . Lack of Transportation (Non-Medical): No  Physical Activity: Inactive  . Days of Exercise per Week: 0 days  . Minutes of Exercise per Session: 0 min  Stress: No Stress Concern Present  . Feeling of Stress : Not at all  Social Connections: Moderately Isolated  . Frequency of Communication with Friends and Family: Once a week  . Frequency of Social Gatherings with Friends and Family: Never  . Attends Religious Services: 1 to 4 times per year  . Active Member of Clubs or Organizations: No  . Attends Archivist Meetings: Never  . Marital Status: Married  Human resources officer Violence: Not At Risk  . Fear of Current or Ex-Partner: No  . Emotionally Abused: No  . Physically Abused: No  . Sexually Abused: No     Review of Systems    General:  No chills, fever, night sweats or weight changes.  Cardiovascular:  No chest pain, dyspnea on exertion, edema, orthopnea, palpitations, paroxysmal nocturnal  dyspnea. Dermatological: No rash, lesions/masses Respiratory: No cough, dyspnea Urologic: No hematuria, dysuria Abdominal:   No nausea, vomiting, diarrhea, bright red blood per rectum, melena, or hematemesis Neurologic:  No visual changes, wkns, changes in mental status. All other systems reviewed and are otherwise negative except as noted above.  Physical Exam    VS:  BP 119/74 (BP Location: Left Arm, Patient Position: Sitting)   Pulse 79   Ht 5\' 9"  (1.753 m)   Wt 228 lb 9.6 oz (103.7 kg)   SpO2 96%   BMI 33.76 kg/m  , BMI Body mass index is 33.76 kg/m. GEN: Well nourished, well developed, in no acute distress. HEENT: normal. Neck: Supple, no JVD, carotid bruits, or masses. Cardiac: RRR, no murmurs, rubs, or gallops. No clubbing, cyanosis, edema.  Radials/DP/PT 2+ and equal bilaterally.  Respiratory:  Respirations regular and unlabored, clear to auscultation bilaterally. GI: Soft, nontender, nondistended, BS + x 4. MS: no deformity or atrophy. Skin: warm and dry, no rash. Neuro:  Strength and sensation are intact. Psych: Normal affect.  Accessory Clinical Findings    Recent Labs: 07/18/2020: B Natriuretic Peptide 130.6 07/21/2020: Magnesium 2.3 07/31/2020: ALT 40; BUN 18; Creat 1.07; Hemoglobin 14.8; Platelets 230; Potassium 4.7; Sodium 138   Recent Lipid Panel    Component Value Date/Time   CHOL 139 05/21/2020 1007   TRIG 89 05/21/2020 1007   HDL 51 05/21/2020 1007   CHOLHDL 2.7 05/21/2020 1007   VLDL 16 06/15/2018 0222   LDLCALC 71 05/21/2020 1007    ECG personally reviewed by me today-   none today.  EKG 07/19/2020 Ectopic atrial rhythm RBBB 88 bpm  Echocardiogram 07/19/2020 IMPRESSIONS    1. Left ventricular  ejection fraction, by estimation, is 65 to 70%. The  left ventricle has normal function. The left ventricle has no regional  wall motion abnormalities. Left ventricular diastolic function could not  be evaluated.  2. Right ventricular systolic  function is normal. The right ventricular  size is normal. There is normal pulmonary artery systolic pressure.  3. The mitral valve is grossly normal. No evidence of mitral valve  regurgitation.  4. The aortic valve is abnormal. Aortic valve regurgitation is not  visualized. Mild to moderate aortic valve sclerosis/calcification is  present, without any evidence of aortic stenosis.  5. Aortic dilatation noted. There is mild dilatation of the aortic root,  measuring 42 mm. There is mild dilatation of the ascending aorta,  measuring 43 mm.  6. Agitated saline contrast bubble study was negative, with no evidence  of any interatrial shunt.   CT angio chest/abdomen 07/18/2020 IMPRESSION: 4.6 cm ascending thoracic aortic aneurysm stable in appearance from the prior exam. Ascending thoracic aortic aneurysm. Recommend semi-annual imaging followup by CTA or MRA and referral to cardiothoracic surgery if not already obtained. This recommendation follows 2010 ACCF/AHA/AATS/ACR/ASA/SCA/SCAI/SIR/STS/SVM Guidelines for the Diagnosis and Management of Patients With Thoracic Aortic Disease. Circulation. 2010; 121: R678-L381. Aortic aneurysm NOS (ICD10-I71.9)  3.8 cm infrarenal abdominal aortic aneurysm also stable from the prior study. Recommend follow-up ultrasound every 2 years. This recommendation follows ACR consensus guidelines: White Paper of the ACR Incidental Findings Committee II on Vascular Findings. J Am Coll Radiol 2013; 10:789-794.  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  Diverticular change without diverticulitis.  No acute abnormality noted.    Assessment & Plan   1.  Sick sinus syndrome-heart rate today 79 bpm.  Status post PPM placed 06/14/2018.  Last device check 06/17/2020 showed patient was not pacemaker dependent, battery status good, lead measurements stable heart rate histogram  favorable, no significant episodes of increased ventricular rate or atrial mode switch.  Coronary artery disease/chest pain-no chest pain today.  Chest discomfort during recent admission felt to be related to hypoxia.  Now stable on room air.  No further episodes of chest discomfort. Increase physical activity as tolerated Continue amlodipine, aspirin, atorvastatin Heart healthy low-sodium diet-salty 6 given Increase physical activity as tolerated  Ascending aortic aneurysm/AAA-repeat CT angio chest 07/18/2020 which showed an ascending aortic aneurysm of 4.6 cm and a measuring AAA 3.8 cm.  No dissection was identified. Continue to monitor  Essential hypertension-BP today 119/79.  Well-controlled at home. Continue amlodipine, metoprolol, furosemide Heart healthy low-sodium diet-salty 6 given Increase physical activity as tolerated  Leg pain-bilateral lower extremity discomfort.  Denies claudication.  Pulses +2 DP and PT.  Located felt that may be related to PAD.  Given pulses and lack of leg pain at this time recommend continue to monitor without further ischemic evaluation.  Disposition: Follow-up with Dr. Sallyanne Kuster in 3 months.  Jossie Ng. Hartford Maulden NP-C    08/07/2020, 8:49 AM [place] [address] Office [phone] Fax [phone]  Notice: This dictation was prepared with Dragon dictation along with smaller phrase technology. Any transcriptional errors that result from this process are unintentional and may not be corrected upon review.

## 2020-08-03 DIAGNOSIS — R0981 Nasal congestion: Secondary | ICD-10-CM | POA: Diagnosis not present

## 2020-08-03 DIAGNOSIS — H66001 Acute suppurative otitis media without spontaneous rupture of ear drum, right ear: Secondary | ICD-10-CM | POA: Diagnosis not present

## 2020-08-04 ENCOUNTER — Encounter: Payer: Self-pay | Admitting: Family Medicine

## 2020-08-05 DIAGNOSIS — M6281 Muscle weakness (generalized): Secondary | ICD-10-CM | POA: Diagnosis not present

## 2020-08-05 DIAGNOSIS — Z79899 Other long term (current) drug therapy: Secondary | ICD-10-CM | POA: Diagnosis not present

## 2020-08-05 DIAGNOSIS — R0789 Other chest pain: Secondary | ICD-10-CM | POA: Diagnosis not present

## 2020-08-05 DIAGNOSIS — I10 Essential (primary) hypertension: Secondary | ICD-10-CM | POA: Diagnosis not present

## 2020-08-05 DIAGNOSIS — I2510 Atherosclerotic heart disease of native coronary artery without angina pectoris: Secondary | ICD-10-CM | POA: Diagnosis not present

## 2020-08-05 DIAGNOSIS — I714 Abdominal aortic aneurysm, without rupture: Secondary | ICD-10-CM | POA: Diagnosis not present

## 2020-08-05 DIAGNOSIS — J9601 Acute respiratory failure with hypoxia: Secondary | ICD-10-CM | POA: Diagnosis not present

## 2020-08-05 DIAGNOSIS — Z87891 Personal history of nicotine dependence: Secondary | ICD-10-CM | POA: Diagnosis not present

## 2020-08-05 DIAGNOSIS — J439 Emphysema, unspecified: Secondary | ICD-10-CM | POA: Diagnosis not present

## 2020-08-07 ENCOUNTER — Other Ambulatory Visit: Payer: Self-pay

## 2020-08-07 ENCOUNTER — Encounter: Payer: Self-pay | Admitting: General Practice

## 2020-08-07 ENCOUNTER — Ambulatory Visit: Payer: Medicare Other | Admitting: General Practice

## 2020-08-07 VITALS — BP 119/74 | HR 79 | Ht 69.0 in | Wt 228.6 lb

## 2020-08-07 DIAGNOSIS — I714 Abdominal aortic aneurysm, without rupture, unspecified: Secondary | ICD-10-CM

## 2020-08-07 DIAGNOSIS — I10 Essential (primary) hypertension: Secondary | ICD-10-CM

## 2020-08-07 DIAGNOSIS — I2510 Atherosclerotic heart disease of native coronary artery without angina pectoris: Secondary | ICD-10-CM

## 2020-08-07 DIAGNOSIS — M79605 Pain in left leg: Secondary | ICD-10-CM

## 2020-08-07 DIAGNOSIS — I495 Sick sinus syndrome: Secondary | ICD-10-CM

## 2020-08-07 DIAGNOSIS — I712 Thoracic aortic aneurysm, without rupture, unspecified: Secondary | ICD-10-CM

## 2020-08-07 DIAGNOSIS — M79604 Pain in right leg: Secondary | ICD-10-CM

## 2020-08-07 NOTE — Progress Notes (Signed)
And thanks again.

## 2020-08-07 NOTE — Patient Instructions (Signed)
Medication Instructions:  The current medical regimen is effective;  continue present plan and medications as directed. Please refer to the Current Medication list given to you today.  *If you need a refill on your cardiac medications before your next appointment, please call your pharmacy*  Lab Work:   Testing/Procedures:  NONE    NONE  Special Instructions  PLEASE READ AND FOLLOW SALTY 6-ATTACHED-1,800mg  daily  PLEASE INCREASE PHYSICAL ACTIVITY AS TOLERATED  Follow-Up: Your next appointment:  3 month(s) In Person with Sanda Klein, MD OR IF UNAVAILABLE [place], FNP-C   At [facility], you and your health needs are our priority.  As part of our continuing mission to provide you with exceptional heart care, we have created designated Provider Care Teams.  These Care Teams include your primary Cardiologist (physician) and Advanced Practice Providers (APPs -  Physician Assistants and Nurse Practitioners) who all work together to provide you with the care you need, when you need it.  We recommend signing up for the patient portal called "MyChart".  Sign up information is provided on this After Visit Summary.  MyChart is used to connect with patients for Virtual Visits (Telemedicine).  Patients are able to view lab/test results, encounter notes, upcoming appointments, etc.  Non-urgent messages can be sent to your provider as well.   To learn more about what you can do with MyChart, go to [email or web address].              6 SALTY THINGS TO AVOID     1,800MG  DAILY

## 2020-08-12 DIAGNOSIS — Z87891 Personal history of nicotine dependence: Secondary | ICD-10-CM | POA: Diagnosis not present

## 2020-08-12 DIAGNOSIS — I2510 Atherosclerotic heart disease of native coronary artery without angina pectoris: Secondary | ICD-10-CM | POA: Diagnosis not present

## 2020-08-12 DIAGNOSIS — Z79899 Other long term (current) drug therapy: Secondary | ICD-10-CM | POA: Diagnosis not present

## 2020-08-12 DIAGNOSIS — I714 Abdominal aortic aneurysm, without rupture: Secondary | ICD-10-CM | POA: Diagnosis not present

## 2020-08-12 DIAGNOSIS — J439 Emphysema, unspecified: Secondary | ICD-10-CM | POA: Diagnosis not present

## 2020-08-12 DIAGNOSIS — I10 Essential (primary) hypertension: Secondary | ICD-10-CM | POA: Diagnosis not present

## 2020-08-12 DIAGNOSIS — R0789 Other chest pain: Secondary | ICD-10-CM | POA: Diagnosis not present

## 2020-08-12 DIAGNOSIS — M6281 Muscle weakness (generalized): Secondary | ICD-10-CM | POA: Diagnosis not present

## 2020-08-12 DIAGNOSIS — J9601 Acute respiratory failure with hypoxia: Secondary | ICD-10-CM | POA: Diagnosis not present

## 2020-08-13 ENCOUNTER — Encounter: Payer: Self-pay | Admitting: *Deleted

## 2020-08-13 ENCOUNTER — Other Ambulatory Visit: Payer: Self-pay | Admitting: *Deleted

## 2020-08-13 NOTE — Patient Outreach (Signed)
[facility]) Care Management [facility] CM Telephone Outreach PCP office completes Transition of Care Outreach post-hospital discharge Post-hospital discharge day # 21 without hospital re-admission Unsuccessful outreach attempt # 1- previously engaged patient  08/13/2020  Tony [name] 01-Apr-1941 [id number]  Unsuccessful outreach attempt to [name], 79 y/o male referred to [place] 07/26/20 by [facility] CMA for EMMI red-Alert notification. Patient had recent hospitalization November 4-9, 2021 for chest/ abdominal pain; was diagnosed with sepsis, acute-on chronic respiratory failure with hypoxia and volume overload. Patient was discharged home to self-care with home health services for PT through [facility]. Prior to most recent hospitalization, patient had no previous/ recent hospitalizations.  Patient has history including, but not limited to, prostate cancer; HTN; previous pacemaker; AAA; and GERD.  HIPAA compliant voice mail message left for patient, requesting return call back.  Plan:  Will re-attempt [facility] CM telephone outreach within 4 business days if I do not hear back from patient/ caregiver first  Oneta Rack, RN, BSN, [place] Care Management  [phone]

## 2020-08-19 ENCOUNTER — Encounter: Payer: Self-pay | Admitting: *Deleted

## 2020-08-19 ENCOUNTER — Other Ambulatory Visit: Payer: Self-pay | Admitting: *Deleted

## 2020-08-19 NOTE — Patient Outreach (Signed)
[facility]) Care Management [facility] CM Telephone Outreach,  PCP office completes Transition of Care follow up post-hospital discharge Post-hospital discharge day # 27 without hospital re-admission  08/19/2020  Tony [name] 1941-05-02 [id number]  Unsuccessful (consecutive) second outreach attempt to [name], 79 y/o male referred to [place] 07/26/20 by [facility] CMA for EMMI red-Alert notification. Patient had recent hospitalization November 4-9, 2021 for chest/ abdominal pain; was diagnosed with sepsis, acute-on chronic respiratory failure with hypoxia and volume overload. Patient was discharged home to self-care with home health services for PT through [facility]. Prior to most recent hospitalization, patient had no previous/ recent hospitalizations.  Patient has history including, but not limited to, prostate cancer; HTN; previous pacemaker; AAA; and GERD.  With today's call attempt, I was unable to leave HIPAA compliant voice mail message for patient, requesting return call back-- phone range without physical or voice mail pick up  Plan:  Will place [facility] CM unsuccessful patient outreach letter in mail requesting call back in writing  Will re-attempt [facility] CM telephone outreach within 4 business days if I do not hear back from patient/ caregiver first  Oneta Rack, RN, BSN, [employer] Coordinator [facility] Care Management  [phone]

## 2020-08-21 DIAGNOSIS — Z87891 Personal history of nicotine dependence: Secondary | ICD-10-CM | POA: Diagnosis not present

## 2020-08-21 DIAGNOSIS — I2510 Atherosclerotic heart disease of native coronary artery without angina pectoris: Secondary | ICD-10-CM | POA: Diagnosis not present

## 2020-08-21 DIAGNOSIS — R0789 Other chest pain: Secondary | ICD-10-CM | POA: Diagnosis not present

## 2020-08-21 DIAGNOSIS — Z79899 Other long term (current) drug therapy: Secondary | ICD-10-CM | POA: Diagnosis not present

## 2020-08-21 DIAGNOSIS — M6281 Muscle weakness (generalized): Secondary | ICD-10-CM | POA: Diagnosis not present

## 2020-08-21 DIAGNOSIS — J439 Emphysema, unspecified: Secondary | ICD-10-CM | POA: Diagnosis not present

## 2020-08-21 DIAGNOSIS — J9601 Acute respiratory failure with hypoxia: Secondary | ICD-10-CM | POA: Diagnosis not present

## 2020-08-21 DIAGNOSIS — I10 Essential (primary) hypertension: Secondary | ICD-10-CM | POA: Diagnosis not present

## 2020-08-21 DIAGNOSIS — I714 Abdominal aortic aneurysm, without rupture: Secondary | ICD-10-CM | POA: Diagnosis not present

## 2020-08-22 ENCOUNTER — Other Ambulatory Visit: Payer: Self-pay | Admitting: *Deleted

## 2020-08-22 ENCOUNTER — Encounter: Payer: Self-pay | Admitting: *Deleted

## 2020-08-22 NOTE — Patient Outreach (Signed)
[facility]) Care Management [facility] CM Telephone Outreach,  PCP office completes Transition of Care follow up post-hospital discharge Post-hospital discharge day # 30 Unsuccessful (consecutive) third outreach attempt- previously engaged caregiver  08/22/2020  Tony [name] Jan 14, 1941 [id number]  Unsuccessful (consecutive)thirdoutreach attempt to [name], 79 y/o male referred to [place] 11/12/21by [facility] CMA for EMMI red-Alert notification. Patient had recent hospitalization November 4-9, 2040for chest/ abdominal pain; was diagnosed with sepsis, acute-on chronic respiratory failure with hypoxia and volume overload. Patient was discharged home to self-care with home health services for PT through [facility]. Prior to most recent hospitalization, patient had no previous/ recent hospitalizations.  Patient has history including, but not limited to, prostate cancer; HTN; previous pacemaker; AAA; and GERD.  HIPAA compliant voice mail message left for patient/ caregiver, requesting return call back.  Plan:  Verified [facility] CM unsuccessful patient outreach letter placed in mail requesting call back in writing on 08/19/20  Will re-attempt [facility] CM telephone outreach in 3 weeks for final outreach attempt if I do not hear back from patient/ caregiver first  Oneta Rack, RN, BSN, [employer] Coordinator [facility] Care Management  [phone]

## 2020-08-26 DIAGNOSIS — I714 Abdominal aortic aneurysm, without rupture: Secondary | ICD-10-CM | POA: Diagnosis not present

## 2020-08-26 DIAGNOSIS — I2510 Atherosclerotic heart disease of native coronary artery without angina pectoris: Secondary | ICD-10-CM | POA: Diagnosis not present

## 2020-08-26 DIAGNOSIS — M6281 Muscle weakness (generalized): Secondary | ICD-10-CM | POA: Diagnosis not present

## 2020-08-26 DIAGNOSIS — I10 Essential (primary) hypertension: Secondary | ICD-10-CM | POA: Diagnosis not present

## 2020-08-26 DIAGNOSIS — J9601 Acute respiratory failure with hypoxia: Secondary | ICD-10-CM | POA: Diagnosis not present

## 2020-08-26 DIAGNOSIS — R0789 Other chest pain: Secondary | ICD-10-CM | POA: Diagnosis not present

## 2020-08-26 DIAGNOSIS — Z79899 Other long term (current) drug therapy: Secondary | ICD-10-CM | POA: Diagnosis not present

## 2020-08-26 DIAGNOSIS — Z87891 Personal history of nicotine dependence: Secondary | ICD-10-CM | POA: Diagnosis not present

## 2020-08-26 DIAGNOSIS — J439 Emphysema, unspecified: Secondary | ICD-10-CM | POA: Diagnosis not present

## 2020-08-29 MED FILL — ABIRATERONE ACETATE 250 MG: 250 | 30 days supply | Qty: 120 | Fill #1

## 2020-09-02 ENCOUNTER — Ambulatory Visit: Payer: Medicare Other

## 2020-09-02 ENCOUNTER — Other Ambulatory Visit: Payer: Self-pay

## 2020-09-02 DIAGNOSIS — G4733 Obstructive sleep apnea (adult) (pediatric): Secondary | ICD-10-CM | POA: Diagnosis not present

## 2020-09-02 DIAGNOSIS — R0683 Snoring: Secondary | ICD-10-CM

## 2020-09-04 DIAGNOSIS — M6281 Muscle weakness (generalized): Secondary | ICD-10-CM | POA: Diagnosis not present

## 2020-09-04 DIAGNOSIS — R0789 Other chest pain: Secondary | ICD-10-CM | POA: Diagnosis not present

## 2020-09-04 DIAGNOSIS — I10 Essential (primary) hypertension: Secondary | ICD-10-CM | POA: Diagnosis not present

## 2020-09-04 DIAGNOSIS — I2510 Atherosclerotic heart disease of native coronary artery without angina pectoris: Secondary | ICD-10-CM | POA: Diagnosis not present

## 2020-09-04 DIAGNOSIS — Z79899 Other long term (current) drug therapy: Secondary | ICD-10-CM | POA: Diagnosis not present

## 2020-09-04 DIAGNOSIS — J439 Emphysema, unspecified: Secondary | ICD-10-CM | POA: Diagnosis not present

## 2020-09-04 DIAGNOSIS — J9601 Acute respiratory failure with hypoxia: Secondary | ICD-10-CM | POA: Diagnosis not present

## 2020-09-04 DIAGNOSIS — Z87891 Personal history of nicotine dependence: Secondary | ICD-10-CM | POA: Diagnosis not present

## 2020-09-04 DIAGNOSIS — I714 Abdominal aortic aneurysm, without rupture: Secondary | ICD-10-CM | POA: Diagnosis not present

## 2020-09-08 DIAGNOSIS — J9601 Acute respiratory failure with hypoxia: Secondary | ICD-10-CM | POA: Diagnosis not present

## 2020-09-08 DIAGNOSIS — Z87891 Personal history of nicotine dependence: Secondary | ICD-10-CM | POA: Diagnosis not present

## 2020-09-08 DIAGNOSIS — J439 Emphysema, unspecified: Secondary | ICD-10-CM | POA: Diagnosis not present

## 2020-09-08 DIAGNOSIS — M6281 Muscle weakness (generalized): Secondary | ICD-10-CM | POA: Diagnosis not present

## 2020-09-08 DIAGNOSIS — I714 Abdominal aortic aneurysm, without rupture: Secondary | ICD-10-CM | POA: Diagnosis not present

## 2020-09-08 DIAGNOSIS — I10 Essential (primary) hypertension: Secondary | ICD-10-CM | POA: Diagnosis not present

## 2020-09-08 DIAGNOSIS — Z79899 Other long term (current) drug therapy: Secondary | ICD-10-CM | POA: Diagnosis not present

## 2020-09-08 DIAGNOSIS — I2510 Atherosclerotic heart disease of native coronary artery without angina pectoris: Secondary | ICD-10-CM | POA: Diagnosis not present

## 2020-09-08 DIAGNOSIS — R0789 Other chest pain: Secondary | ICD-10-CM | POA: Diagnosis not present

## 2020-09-09 ENCOUNTER — Ambulatory Visit (INDEPENDENT_AMBULATORY_CARE_PROVIDER_SITE_OTHER): Payer: Medicare Other | Admitting: Internal Medicine

## 2020-09-09 ENCOUNTER — Other Ambulatory Visit: Payer: Self-pay

## 2020-09-09 DIAGNOSIS — J9611 Chronic respiratory failure with hypoxia: Secondary | ICD-10-CM | POA: Diagnosis not present

## 2020-09-09 DIAGNOSIS — J849 Interstitial pulmonary disease, unspecified: Secondary | ICD-10-CM

## 2020-09-09 LAB — PULMONARY FUNCTION TEST
DL/VA % pred: 77 %
DL/VA: 3.07 ml/min/mmHg/L
DLCO cor % pred: 60 %
DLCO cor: 14.11 ml/min/mmHg
DLCO unc % pred: 61 %
DLCO unc: 14.19 ml/min/mmHg
FEF 25-75 Post: 2.71 L/sec
FEF 25-75 Pre: 1.94 L/sec
FEF2575-%Change-Post: 39 %
FEF2575-%Pred-Post: 143 %
FEF2575-%Pred-Pre: 102 %
FEV1-%Change-Post: 7 %
FEV1-%Pred-Post: 95 %
FEV1-%Pred-Pre: 89 %
FEV1-Post: 2.58 L
FEV1-Pre: 2.4 L
FEV1FVC-%Change-Post: 0 %
FEV1FVC-%Pred-Pre: 105 %
FEV6-%Change-Post: 6 %
FEV6-%Pred-Post: 95 %
FEV6-%Pred-Pre: 89 %
FEV6-Post: 3.38 L
FEV6-Pre: 3.17 L
FEV6FVC-%Change-Post: 0 %
FEV6FVC-%Pred-Post: 106 %
FEV6FVC-%Pred-Pre: 107 %
FVC-%Change-Post: 7 %
FVC-%Pred-Post: 89 %
FVC-%Pred-Pre: 84 %
FVC-Post: 3.4 L
FVC-Pre: 3.18 L
Post FEV1/FVC ratio: 76 %
Post FEV6/FVC ratio: 99 %
Pre FEV1/FVC ratio: 76 %
Pre FEV6/FVC Ratio: 100 %
RV % pred: 72 %
RV: 1.82 L
TLC % pred: 76 %
TLC: 5.07 L

## 2020-09-09 NOTE — Progress Notes (Signed)
PFT done today. 

## 2020-09-10 ENCOUNTER — Encounter: Payer: Self-pay | Admitting: *Deleted

## 2020-09-10 ENCOUNTER — Other Ambulatory Visit: Payer: Self-pay | Admitting: *Deleted

## 2020-09-10 NOTE — Patient Outreach (Signed)
[employer] Customer service manager [facility]) Care Management [facility] CM Telephone Outreach; case closure Post-hospital discharge day # 49 without hospital re-admission [facility] CM Case Closure 09/10/2020  [place] Yarbro Puff [birth year] [id number]  Successful fourth outreach attempt to Mickey Farber, spouse/ caregiver on [facility] DPR, for Alic Hilburn, 79 y/o male referred to [facility] RN CM 07/26/20 by [facility] CMA for EMMI red-Alert notification. Patient had recent hospitalization November 4-9, 2021 for chest/ abdominal pain; was diagnosed with sepsis, acute-on chronic respiratory failure with hypoxia and volume overload. Patient was discharged home to self-care with home health services for PT through [facility]. Prior to most recent hospitalization, patient had no previous/ recent hospitalizations.  Patient has history including, but not limited to, prostate cancer; HTN; previous pacemaker; AAA; and GERD.  HIPAA/ identity verified with [name], who reports patient "is doing much better" since our last phone call 07/30/20; she reports he has not had any new hospitalizations or ED visits; continues to work with home health PT, "it's making a big difference- he's doing well with it;" and confirms that patient has attended all scheduled provider appointments.  She denies clinical concerns around patient's condition and states patient continues "takin his medications and doing everything he is supposed to."  Spouse tells me today that she does not believe that she and patient need additional phone calls as patient "is doing so well;" she tells me today that she if any needs arise, she understands to contact patient's PCP right away.  Caregiver politely requests case closure today, verbalizing no ongoing care coordination/ disease management/ pharmacy/ community resource needs.  Caregiver denies further issues, concerns, or problems today.  I confirmed that patient has the main [facility] CM office phone number, and the [facility] CM 24-hour nurse  advice phone number should issues arise and she and patient wish to resume participation in [facility] CM program.    Plan:  Will close [facility] CM Case as requested and make patient inactive with [facility] CM; will make patient's PCP aware of same.  Caryl Pina, RN, BSN, [facility] Care Management  [phone]

## 2020-09-11 DIAGNOSIS — G4733 Obstructive sleep apnea (adult) (pediatric): Secondary | ICD-10-CM | POA: Diagnosis not present

## 2020-09-13 ENCOUNTER — Other Ambulatory Visit: Payer: Self-pay | Admitting: Cardiovascular Disease

## 2020-09-17 ENCOUNTER — Ambulatory Visit (INDEPENDENT_AMBULATORY_CARE_PROVIDER_SITE_OTHER): Payer: Medicare Other

## 2020-09-17 DIAGNOSIS — I495 Sick sinus syndrome: Secondary | ICD-10-CM

## 2020-09-17 LAB — CUP PACEART REMOTE DEVICE CHECK
Battery Remaining Longevity: 139 mo
Battery Voltage: 3.03 V
Brady Statistic AP VP Percent: 0.09 %
Brady Statistic AP VS Percent: 75.68 %
Brady Statistic AS VP Percent: 0.02 %
Brady Statistic AS VS Percent: 24.22 %
Brady Statistic RA Percent Paced: 75.19 %
Brady Statistic RV Percent Paced: 0.11 %
Date Time Interrogation Session: 20220103230649
Implantable Lead Implant Date: 20191001
Implantable Lead Implant Date: 20191001
Implantable Lead Location: 753859
Implantable Lead Location: 753860
Implantable Lead Model: 5076
Implantable Lead Model: 5076
Implantable Pulse Generator Implant Date: 20191001
Lead Channel Impedance Value: 323 Ohm
Lead Channel Impedance Value: 323 Ohm
Lead Channel Impedance Value: 418 Ohm
Lead Channel Impedance Value: 456 Ohm
Lead Channel Pacing Threshold Amplitude: 0.5 V
Lead Channel Pacing Threshold Amplitude: 0.625 V
Lead Channel Pacing Threshold Pulse Width: 0.4 ms
Lead Channel Pacing Threshold Pulse Width: 0.4 ms
Lead Channel Sensing Intrinsic Amplitude: 2.875 mV
Lead Channel Sensing Intrinsic Amplitude: 2.875 mV
Lead Channel Sensing Intrinsic Amplitude: 3.25 mV
Lead Channel Sensing Intrinsic Amplitude: 3.25 mV
Lead Channel Setting Pacing Amplitude: 1.5 V
Lead Channel Setting Pacing Amplitude: 2.5 V
Lead Channel Setting Pacing Pulse Width: 0.4 ms
Lead Channel Setting Sensing Sensitivity: 0.9 mV

## 2020-09-18 ENCOUNTER — Other Ambulatory Visit: Payer: Self-pay | Admitting: Oncology

## 2020-09-18 ENCOUNTER — Other Ambulatory Visit: Payer: Self-pay | Admitting: Family Medicine

## 2020-09-18 DIAGNOSIS — I2510 Atherosclerotic heart disease of native coronary artery without angina pectoris: Secondary | ICD-10-CM | POA: Diagnosis not present

## 2020-09-18 DIAGNOSIS — M6281 Muscle weakness (generalized): Secondary | ICD-10-CM | POA: Diagnosis not present

## 2020-09-18 DIAGNOSIS — I714 Abdominal aortic aneurysm, without rupture: Secondary | ICD-10-CM | POA: Diagnosis not present

## 2020-09-18 DIAGNOSIS — R600 Localized edema: Secondary | ICD-10-CM

## 2020-09-18 DIAGNOSIS — Z79899 Other long term (current) drug therapy: Secondary | ICD-10-CM | POA: Diagnosis not present

## 2020-09-18 DIAGNOSIS — Z87891 Personal history of nicotine dependence: Secondary | ICD-10-CM | POA: Diagnosis not present

## 2020-09-18 DIAGNOSIS — C61 Malignant neoplasm of prostate: Secondary | ICD-10-CM

## 2020-09-18 DIAGNOSIS — I10 Essential (primary) hypertension: Secondary | ICD-10-CM | POA: Diagnosis not present

## 2020-09-18 DIAGNOSIS — J439 Emphysema, unspecified: Secondary | ICD-10-CM | POA: Diagnosis not present

## 2020-09-27 DIAGNOSIS — I10 Essential (primary) hypertension: Secondary | ICD-10-CM | POA: Diagnosis not present

## 2020-09-27 DIAGNOSIS — J439 Emphysema, unspecified: Secondary | ICD-10-CM | POA: Diagnosis not present

## 2020-09-27 DIAGNOSIS — M6281 Muscle weakness (generalized): Secondary | ICD-10-CM | POA: Diagnosis not present

## 2020-09-27 DIAGNOSIS — I2510 Atherosclerotic heart disease of native coronary artery without angina pectoris: Secondary | ICD-10-CM | POA: Diagnosis not present

## 2020-09-27 DIAGNOSIS — I714 Abdominal aortic aneurysm, without rupture: Secondary | ICD-10-CM | POA: Diagnosis not present

## 2020-09-27 DIAGNOSIS — Z79899 Other long term (current) drug therapy: Secondary | ICD-10-CM | POA: Diagnosis not present

## 2020-09-27 DIAGNOSIS — Z87891 Personal history of nicotine dependence: Secondary | ICD-10-CM | POA: Diagnosis not present

## 2020-09-29 ENCOUNTER — Other Ambulatory Visit: Payer: Self-pay | Admitting: Family Medicine

## 2020-09-29 ENCOUNTER — Other Ambulatory Visit: Payer: Self-pay | Admitting: Cardiovascular Disease

## 2020-09-29 DIAGNOSIS — F418 Other specified anxiety disorders: Secondary | ICD-10-CM

## 2020-09-29 DIAGNOSIS — J439 Emphysema, unspecified: Secondary | ICD-10-CM

## 2020-10-01 ENCOUNTER — Telehealth: Payer: Self-pay | Admitting: Family Medicine

## 2020-10-01 DIAGNOSIS — I714 Abdominal aortic aneurysm, without rupture: Secondary | ICD-10-CM | POA: Diagnosis not present

## 2020-10-01 DIAGNOSIS — Z87891 Personal history of nicotine dependence: Secondary | ICD-10-CM | POA: Diagnosis not present

## 2020-10-01 DIAGNOSIS — I2510 Atherosclerotic heart disease of native coronary artery without angina pectoris: Secondary | ICD-10-CM | POA: Diagnosis not present

## 2020-10-01 DIAGNOSIS — M6281 Muscle weakness (generalized): Secondary | ICD-10-CM | POA: Diagnosis not present

## 2020-10-01 DIAGNOSIS — I10 Essential (primary) hypertension: Secondary | ICD-10-CM | POA: Diagnosis not present

## 2020-10-01 DIAGNOSIS — Z79899 Other long term (current) drug therapy: Secondary | ICD-10-CM | POA: Diagnosis not present

## 2020-10-01 DIAGNOSIS — J439 Emphysema, unspecified: Secondary | ICD-10-CM | POA: Diagnosis not present

## 2020-10-01 NOTE — Progress Notes (Signed)
Remote pacemaker transmission.   

## 2020-10-01 NOTE — Telephone Encounter (Signed)
Patient is needing a refill on his Lorazepam 1 mg and his Benzonatate 100 mg.  Pharmacy: [facility] [address] in [place],  Please advise

## 2020-10-02 MED FILL — ABIRATERONE ACETATE 250 MG: 250 | 30 days supply | Qty: 120 | Fill #2

## 2020-10-02 NOTE — Telephone Encounter (Signed)
Rx was sent yesterday. Thanks, BJ

## 2020-10-03 ENCOUNTER — Other Ambulatory Visit: Payer: Self-pay

## 2020-10-03 ENCOUNTER — Inpatient Hospital Stay: Payer: Medicare Other | Attending: Oncology

## 2020-10-03 ENCOUNTER — Telehealth: Payer: Self-pay | Admitting: Internal Medicine

## 2020-10-03 ENCOUNTER — Inpatient Hospital Stay: Payer: Medicare Other | Admitting: Oncology

## 2020-10-03 ENCOUNTER — Inpatient Hospital Stay: Payer: Medicare Other

## 2020-10-03 DIAGNOSIS — J449 Chronic obstructive pulmonary disease, unspecified: Secondary | ICD-10-CM

## 2020-10-03 DIAGNOSIS — Z5111 Encounter for antineoplastic chemotherapy: Secondary | ICD-10-CM | POA: Insufficient documentation

## 2020-10-03 DIAGNOSIS — G4733 Obstructive sleep apnea (adult) (pediatric): Secondary | ICD-10-CM

## 2020-10-03 DIAGNOSIS — I10 Essential (primary) hypertension: Secondary | ICD-10-CM | POA: Diagnosis not present

## 2020-10-03 DIAGNOSIS — C61 Malignant neoplasm of prostate: Secondary | ICD-10-CM

## 2020-10-03 DIAGNOSIS — E876 Hypokalemia: Secondary | ICD-10-CM | POA: Insufficient documentation

## 2020-10-03 DIAGNOSIS — I4891 Unspecified atrial fibrillation: Secondary | ICD-10-CM

## 2020-10-03 LAB — CBC WITH DIFFERENTIAL (CANCER CENTER ONLY)
Abs Immature Granulocytes: 0.03 10*3/uL (ref 0.00–0.07)
Basophils Absolute: 0 10*3/uL (ref 0.0–0.1)
Basophils Relative: 1 %
Eosinophils Absolute: 0 10*3/uL (ref 0.0–0.5)
Eosinophils Relative: 0 %
HCT: 46 % (ref 39.0–52.0)
Hemoglobin: 15.1 g/dL (ref 13.0–17.0)
Immature Granulocytes: 1 %
Lymphocytes Relative: 17 %
Lymphs Abs: 1 10*3/uL (ref 0.7–4.0)
MCH: 31 pg (ref 26.0–34.0)
MCHC: 32.8 g/dL (ref 30.0–36.0)
MCV: 94.5 fL (ref 80.0–100.0)
Monocytes Absolute: 0.7 10*3/uL (ref 0.1–1.0)
Monocytes Relative: 11 %
Neutro Abs: 4.2 10*3/uL (ref 1.7–7.7)
Neutrophils Relative %: 70 %
Platelet Count: 172 10*3/uL (ref 150–400)
RBC: 4.87 MIL/uL (ref 4.22–5.81)
RDW: 13.6 % (ref 11.5–15.5)
WBC Count: 6 10*3/uL (ref 4.0–10.5)
nRBC: 0 % (ref 0.0–0.2)

## 2020-10-03 LAB — CMP (CANCER CENTER ONLY)
ALT: 20 U/L (ref 0–44)
AST: 32 U/L (ref 15–41)
Albumin: 4 g/dL (ref 3.5–5.0)
Alkaline Phosphatase: 84 U/L (ref 38–126)
Anion gap: 12 (ref 5–15)
BUN: 16 mg/dL (ref 8–23)
CO2: 25 mmol/L (ref 22–32)
Calcium: 9.3 mg/dL (ref 8.9–10.3)
Chloride: 103 mmol/L (ref 98–111)
Creatinine: 1.24 mg/dL (ref 0.61–1.24)
GFR, Estimated: 60 mL/min — ABNORMAL LOW (ref >60)
Glucose, Bld: 114 mg/dL — ABNORMAL HIGH (ref 70–99)
Potassium: 3.9 mmol/L (ref 3.5–5.1)
Sodium: 140 mmol/L (ref 135–145)
Total Bilirubin: 0.7 mg/dL (ref 0.3–1.2)
Total Protein: 7.2 g/dL (ref 6.5–8.1)

## 2020-10-03 MED ORDER — LEUPROLIDE ACETATE (3 MONTH) 22.5 MG ~~LOC~~ KIT
22.5000 mg | PACK | Freq: Once | SUBCUTANEOUS | Status: AC
  Administered 2020-10-03: 22.5 mg via SUBCUTANEOUS

## 2020-10-03 MED ORDER — LEUPROLIDE ACETATE (3 MONTH) 22.5 MG ~~LOC~~ KIT
PACK | SUBCUTANEOUS | Status: AC
  Filled 2020-10-03: qty 22.5

## 2020-10-03 NOTE — Progress Notes (Signed)
Hematology and Oncology Follow Up Visit  Perl Klauser [id number] 08-29-1941 80 y.o. 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  He presented with Gleason score 4+5 = 9 and a PSA 15.7 in 2015   Prior Therapy:  He is status post radiation therapy for external beam radiation completed in September 2015 for a total of 45 gray. He also status post seed implant brachytherapy boost completed in October 2015.   He received total of 1 year of androgen deprivation 22,015 and 2016. His PSA nadir was to 0.47 in November 2016. His PSA was 4.08 in May 2017.  Current therapy:  Eligard 22.5 mg every 3 months.  He is due to receive it today and repeated in 3 months.  Zytiga 1000 mg with prednisone started in August 2017.   Interim History: Mr. Torain is here for a repeat evaluation.  Since the last visit, he reports no major changes in his health.  He was hospitalized in November 2021 for urinary retention.  He has reported urinating freely at this time without any issues.  He denies any hematuria, dysuria or changes in his bowel habits.  Full status quality of life remained excellent.  He denies any complications related to Zytiga at this time.         Medications: Unchanged on review. Current Outpatient Medications  Medication Sig Dispense Refill  . abiraterone acetate (ZYTIGA) 250 MG tablet TAKE 4 TABLETS (1,000 MG TOTAL) BY MOUTH DAILY. TAKE ON AN EMPTY STOMACH 1 HOUR BEFORE OR 2 HOURS AFTER A MEAL 120 tablet 2  . acetaminophen (TYLENOL) 500 MG tablet Take 1,000 mg by mouth every 6 (six) hours as needed for headache (pain).     [name] albuterol (VENTOLIN HFA) 108 (90 Base) MCG/ACT inhaler TAKE 2 PUFFS BY MOUTH EVERY 6 HOURS AS NEEDED FOR WHEEZE OR SHORTNESS OF BREATH 8.5 each 2  . amLODipine (NORVASC) 2.5 MG tablet TAKE 1 TABLET BY MOUTH EVERY DAY  (Patient taking differently: Take 2.5 mg by mouth daily. ) 90 tablet 2  . Ascorbic Acid (VITAMIN C PO) Take 1 tablet by mouth daily.    [name] aspirin EC 81 MG tablet Take 81 mg by mouth daily.    [name] atorvastatin (LIPITOR) 20 MG tablet TAKE 1 TABLET BY MOUTH EVERY DAY 90 tablet 2  . carbamide peroxide (DEBROX) 6.5 % OTIC solution Place 5 drops into both ears daily as needed (for earwax).    . citalopram (CELEXA) 10 MG tablet TAKE 1 TABLET BY MOUTH EVERY DAY (Patient taking differently: Take 10 mg by mouth daily. ) 90 tablet 3  . diclofenac sodium (VOLTAREN) 1 % GEL Apply 4 g topically 4 (four) times daily. (Patient taking differently: Apply 4 g topically 4 (four) times daily as needed (pain). ) 4 Tube 3  . docusate sodium (COLACE) 100 MG capsule Take 100 mg by mouth daily with supper.    . fluticasone (FLONASE) 50 MCG/ACT nasal spray PLACE 1 SPRAY INTO BOTH NOSTRILS DAILY AS NEEDED FOR ALLERGIES OR RHINITIS (AT BEDTIME). (Patient taking differently: Place 1 spray into both nostrils at bedtime as needed for allergies or rhinitis. ) 48 mL 1  . Fluticasone-Salmeterol (ADVAIR) 100-50 MCG/DOSE AEPB Inhale 1 puff into the lungs 2 (two) times daily. 1 each 3  . furosemide (LASIX) 20 MG tablet TAKE 1 TABLET BY MOUITH DAILY FOR 5 DAYS , THEN DAILY AS  NEEDED FOR EDEMA 90 tablet 1  . LORazepam (ATIVAN) 1 MG tablet TAKE 1 TABLET (1 MG TOTAL) BY MOUTH 2 (TWO) TIMES DAILY AS NEEDED. 60 tablet 3  . metoprolol tartrate (LOPRESSOR) 25 MG tablet TAKE 1 TABLET BY MOUTH TWICE A DAY 180 tablet 2  . Multiple Vitamin (MULTIVITAMIN WITH MINERALS) TABS tablet Take 0.5 tablets by mouth every other day. Centrum    . omeprazole (PRILOSEC) 20 MG capsule TAKE 1 CAPSULE (20 MG TOTAL) BY MOUTH DAILY BEFORE BREAKFAST. 90 capsule 3  . predniSONE (DELTASONE) 5 MG tablet TAKE 1 TABLET BY MOUTH EVERY DAY WITH BREAKFAST 90 tablet 0  . sulfamethoxazole-trimethoprim (BACTRIM DS) 800-160 MG tablet Take 2 tablets by mouth every 12 (twelve) hours.  20 tablet 0  . Tetrahydrozoline HCl (VISINE OP) Place 1 drop into both eyes daily as needed (red eyes).     No current facility-administered medications for this visit.     Allergies:  Allergies  Allergen Reactions  . Penicillins Rash    Has patient had a PCN reaction causing immediate rash, facial/tongue/throat swelling, SOB or lightheadedness with hypotension: Yes Has patient had a PCN reaction causing severe rash involving mucus membranes or skin necrosis: No Has patient had a PCN reaction that required hospitalization: No Has patient had a PCN reaction occurring within the last 10 years: No If all of the above answers are "NO", then may proceed with Cephalosporin use.      Physical Exam:    Blood pressure (!) 129/95, pulse 89, temperature 98.1 F (36.7 C), temperature source Tympanic, resp. rate 20, height 5\' 9"  (1.753 m), weight 220 lb 14.4 oz (100.2 kg), SpO2 96 %.       ECOG: 1     General appearance: Alert, awake without any distress. Head: Atraumatic without abnormalities Oropharynx: Without any thrush or ulcers. Eyes: No scleral icterus. Lymph nodes: No lymphadenopathy noted in the cervical, supraclavicular, or axillary nodes Heart:regular rate and rhythm, without any murmurs or gallops.   Lung: Clear to auscultation without any rhonchi, wheezes or dullness to percussion. Abdomin: Soft, nontender without any shifting dullness or ascites. Musculoskeletal: No clubbing or cyanosis. Neurological: No motor or sensory deficits. Skin: No rashes or lesions.             Lab Results: Lab Results  Component Value Date   WBC 6.0 10/03/2020   HGB 15.1 10/03/2020   HCT 46.0 10/03/2020   MCV 94.5 10/03/2020   PLT 172 10/03/2020     Chemistry      Component Value Date/Time   NA 138 07/31/2020 1325   NA 139 02/03/2019 0857   NA 141 08/27/2017 0915   K 4.7 07/31/2020 1325   K 4.8 08/27/2017 0915   CL 104 07/31/2020 1325   CO2 24 07/31/2020 1325    CO2 26 08/27/2017 0915   BUN 18 07/31/2020 1325   BUN 13 02/03/2019 0857   BUN 16.2 08/27/2017 0915   CREATININE 1.07 07/31/2020 1325   CREATININE 0.9 08/27/2017 0915   GLU 105 01/10/2016 0000      Component Value Date/Time   CALCIUM 9.3 07/31/2020 1325   CALCIUM 9.8 08/27/2017 0915   ALKPHOS 97 07/23/2020 0614   ALKPHOS 83 08/27/2017 0915   AST 20 07/31/2020 1325   AST 16 07/03/2020 1302   AST 23 08/27/2017 0915   ALT 40 07/31/2020 1325   ALT 14 07/03/2020 1302   ALT 17 08/27/2017 0915   BILITOT 0.9 07/31/2020 1325   BILITOT  0.6 07/03/2020 1302   BILITOT 0.95 08/27/2017 0915         Results for BLAYZE, HAEN (MRN [id number]) as of 10/03/2020 13:05  Ref. Range 04/03/2020 07:25 07/03/2020 13:02  Prostate Specific Ag, Serum Latest Ref Range: 0.0 - 4.0 ng/mL <0.1 <0.1      Impression and Plan:   80 year old man with:  1. Castration-resistant advanced prostate cancer with lymphadenopathy and has no residual disease at this time since 2017.    The natural course of his disease was reviewed at this time and continues to enjoy excellent response to [facility].  His PSA is undetectable and imaging studies in May 2021 showed no evidence of any residual disease.  Risks and benefits of continuing this treatment were discussed.  Potential treatment options utilizing systemic chemotherapy were also reviewed if he develops progression of disease.   3. Hypokalemia: No issues noted with potassium within normal range on Zytiga.  We will continue to monitor.  4. Hypertension: No issues reported to his blood pressure and will monitor on Zytiga.  5.  Prognosis and goals of care: Therapy remains palliative with aggressive measures are warranted given his excellent performance status.  6.  Androgen deprivation therapy: Risks and benefits of continuing this treatment long-term were discussed.  Potential complication including weight gain, hot flashes were reviewed.  He will receive  Eligard today and repeated in 3 months.   7. Follow-up: In 3 months for a follow-up visit.  30  minutes were spent on this visit.  The time was dedicated to reviewing laboratory data, disease status update and future treatment options.   Zola Button, MD 1/20/202212:53 PM

## 2020-10-03 NOTE — Telephone Encounter (Signed)
Home sleep test showed severe obstructive sleep apnea, averaging 40 apneas/ hour. Oxygen levels were low throughout the night.  We need to schedule CPAP titration sleep study, adding O2 if needed, for dx OSA, COPD, Atrial Fibrillation  Please call us for results 2 weeks after the study.

## 2020-10-03 NOTE — Telephone Encounter (Signed)
Called and spoke with Patient.  Patient requested HST results from 09/02/20 from Dr. Annamaria Boots.  Message routed to Dr. Annamaria Boots to advise

## 2020-10-03 NOTE — Patient Instructions (Signed)

## 2020-10-03 NOTE — Telephone Encounter (Signed)
Called and spoke with Patient's Wife, [name] (DPR). Dr. Janee Morn results and recommendations given.  Understanding stated.  Cpap titration order placed.  Nothing further at this time.

## 2020-10-04 ENCOUNTER — Telehealth: Payer: Self-pay

## 2020-10-04 LAB — PROSTATE-SPECIFIC AG, SERUM (LABCORP): Prostate Specific Ag, Serum: <0.1 ng/mL (ref 0.0–4.0)

## 2020-10-04 NOTE — Telephone Encounter (Signed)
-----   Message from Wyatt Portela, MD sent at 10/04/2020  9:02 AM EST ----- Please let him know his PSA is still low

## 2020-10-04 NOTE — Telephone Encounter (Signed)
Called patient and made him aware of PSA result. Verbalized understanding.  

## 2020-10-09 DIAGNOSIS — Z79899 Other long term (current) drug therapy: Secondary | ICD-10-CM | POA: Diagnosis not present

## 2020-10-09 DIAGNOSIS — I10 Essential (primary) hypertension: Secondary | ICD-10-CM | POA: Diagnosis not present

## 2020-10-09 DIAGNOSIS — I714 Abdominal aortic aneurysm, without rupture: Secondary | ICD-10-CM | POA: Diagnosis not present

## 2020-10-09 DIAGNOSIS — J439 Emphysema, unspecified: Secondary | ICD-10-CM | POA: Diagnosis not present

## 2020-10-09 DIAGNOSIS — I2510 Atherosclerotic heart disease of native coronary artery without angina pectoris: Secondary | ICD-10-CM | POA: Diagnosis not present

## 2020-10-09 DIAGNOSIS — M6281 Muscle weakness (generalized): Secondary | ICD-10-CM | POA: Diagnosis not present

## 2020-10-09 DIAGNOSIS — Z87891 Personal history of nicotine dependence: Secondary | ICD-10-CM | POA: Diagnosis not present

## 2020-10-10 ENCOUNTER — Telehealth: Payer: Self-pay | Admitting: Pharmacist

## 2020-10-14 DIAGNOSIS — I10 Essential (primary) hypertension: Secondary | ICD-10-CM | POA: Diagnosis not present

## 2020-10-14 DIAGNOSIS — I2510 Atherosclerotic heart disease of native coronary artery without angina pectoris: Secondary | ICD-10-CM | POA: Diagnosis not present

## 2020-10-14 DIAGNOSIS — Z87891 Personal history of nicotine dependence: Secondary | ICD-10-CM | POA: Diagnosis not present

## 2020-10-14 DIAGNOSIS — I714 Abdominal aortic aneurysm, without rupture: Secondary | ICD-10-CM | POA: Diagnosis not present

## 2020-10-14 DIAGNOSIS — J439 Emphysema, unspecified: Secondary | ICD-10-CM | POA: Diagnosis not present

## 2020-10-14 DIAGNOSIS — M6281 Muscle weakness (generalized): Secondary | ICD-10-CM | POA: Diagnosis not present

## 2020-10-14 DIAGNOSIS — Z79899 Other long term (current) drug therapy: Secondary | ICD-10-CM | POA: Diagnosis not present

## 2020-10-16 NOTE — Chronic Care Management (AMB) (Signed)
Chronic Care Management Pharmacy Assistant   Name: Tony [name]  MRN: [id number] DOB: 30-Jul-1941  Reason for Encounter: Disease State/Hypertension Adherence Call   PCP : [name], [name], MD  Allergies:   Allergies  Allergen Reactions  . Penicillins Rash    Has patient had a PCN reaction causing immediate rash, facial/tongue/throat swelling, SOB or lightheadedness with hypotension: Yes Has patient had a PCN reaction causing severe rash involving mucus membranes or skin necrosis: No Has patient had a PCN reaction that required hospitalization: No Has patient had a PCN reaction occurring within the last 10 years: No If all of the above answers are "NO", then may proceed with Cephalosporin use.    Medications: Outpatient Encounter Medications as of 10/10/2020  Medication Sig Note  . abiraterone acetate (ZYTIGA) 250 MG tablet TAKE 4 TABLETS (1,000 MG TOTAL) BY MOUTH DAILY. TAKE ON AN EMPTY STOMACH 1 HOUR BEFORE OR 2 HOURS AFTER A MEAL   . acetaminophen (TYLENOL) 500 MG tablet Take 1,000 mg by mouth every 6 (six) hours as needed for headache (pain).    [name] albuterol (VENTOLIN HFA) 108 (90 Base) MCG/ACT inhaler TAKE 2 PUFFS BY MOUTH EVERY 6 HOURS AS NEEDED FOR WHEEZE OR SHORTNESS OF BREATH   . amLODipine (NORVASC) 2.5 MG tablet TAKE 1 TABLET BY MOUTH EVERY DAY (Patient taking differently: Take 2.5 mg by mouth daily. ) 07/19/2020: LF 9.11.21 #90  . Ascorbic Acid (VITAMIN C PO) Take 1 tablet by mouth daily.   [name] aspirin EC 81 MG tablet Take 81 mg by mouth daily.   [name] atorvastatin (LIPITOR) 20 MG tablet TAKE 1 TABLET BY MOUTH EVERY DAY   . carbamide peroxide (DEBROX) 6.5 % OTIC solution Place 5 drops into both ears daily as needed (for earwax). 07/19/2020: LF 05/31/20 #90  . citalopram (CELEXA) 10 MG tablet TAKE 1 TABLET BY MOUTH EVERY DAY (Patient taking differently: Take 10 mg by mouth daily. )   . diclofenac sodium (VOLTAREN) 1 % GEL Apply 4 g topically 4 (four) times daily. (Patient  taking differently: Apply 4 g topically 4 (four) times daily as needed (pain). )   . docusate sodium (COLACE) 100 MG capsule Take 100 mg by mouth daily with supper. 07/30/2020: with wife, [name], on [place], takes prn- "usually" once a day  . fluticasone (FLONASE) 50 MCG/ACT nasal spray PLACE 1 SPRAY INTO BOTH NOSTRILS DAILY AS NEEDED FOR ALLERGIES OR RHINITIS (AT BEDTIME). (Patient taking differently: Place 1 spray into both nostrils at bedtime as needed for allergies or rhinitis. )   . Fluticasone-Salmeterol (ADVAIR) 100-50 MCG/DOSE AEPB Inhale 1 puff into the lungs 2 (two) times daily. 07/19/2020: LF 07/15/20 #60  . furosemide (LASIX) 20 MG tablet TAKE 1 TABLET BY MOUITH DAILY FOR 5 DAYS , THEN DAILY AS NEEDED FOR EDEMA   . LORazepam (ATIVAN) 1 MG tablet TAKE 1 TABLET (1 MG TOTAL) BY MOUTH 2 (TWO) TIMES DAILY AS NEEDED.   . metoprolol tartrate (LOPRESSOR) 25 MG tablet TAKE 1 TABLET BY MOUTH TWICE A DAY   . Multiple Vitamin (MULTIVITAMIN WITH MINERALS) TABS tablet Take 0.5 tablets by mouth every other day. Centrum   . omeprazole (PRILOSEC) 20 MG capsule TAKE 1 CAPSULE (20 MG TOTAL) BY MOUTH DAILY BEFORE BREAKFAST. 07/19/2020: LF 06/03/20 #90  . predniSONE (DELTASONE) 5 MG tablet TAKE 1 TABLET BY MOUTH EVERY DAY WITH BREAKFAST   . sulfamethoxazole-trimethoprim (BACTRIM DS) 800-160 MG tablet Take 2 tablets by mouth every 12 (twelve) hours. 07/30/2020: with  wife, [name], on [place], patient took as prescribed and completed total prescribed days/ doses on 07/29/20  . Tetrahydrozoline HCl (VISINE OP) Place 1 drop into both eyes daily as needed (red eyes).    No facility-administered encounter medications on file as of 10/10/2020.    Current Diagnosis: Patient Active Problem List   Diagnosis Date Noted  . Snoring 07/25/2020  . 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 Morbid obesity ([place]) 11/14/2019  . Prediabetes 11/14/2019  . Varicose veins of both lower extremities 05/17/2019  . Bilateral lower extremity  edema 03/13/2019  . Hypokalemia 03/13/2019  . 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 SSS (sick sinus syndrome) ([place]) 06/14/2018  . 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 Coronary artery disease involving native coronary artery of native heart without angina pectoris 06/08/2018  . Sick sinus syndrome ([place]) 06/08/2018  . Aneurysm of thoracic aorta ([place]) 02/03/2018  . 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 Emphysema lung ([place]) 02/03/2018  . 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 Acute appendicitis 01/21/2018  . Perforated appendicitis 01/21/2018  . Chronic fatigue 06/01/2017  . Essential hypertension, benign 07/09/2016  . 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[place]) 07/06/2016  . Coronary artery calcification seen on CAT scan 05/15/2016  . Anxiety disorder 04/08/2016  . Insomnia 04/08/2016  . 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 Tobacco use disorder 04/08/2016  . Hypercholesterolemia 04/08/2016  . Malignant neoplasm of prostate ([place]) 02/23/2014    Goals Addressed   None    Reviewed chart prior to disease state call. Spoke with patient regarding BP  Recent Office Vitals: BP Readings from Last 3 Encounters:  10/03/20 (!) 129/95  08/07/20 119/74  07/31/20 124/80   Pulse Readings from Last 3 Encounters:  10/03/20 89  08/07/20 79  07/31/20 74    Wt Readings from Last 3 Encounters:  10/03/20 220 lb 14.4 oz (100.2 kg)  08/07/20 228 lb 9.6 oz (103.7 kg)  07/31/20 226 lb (102.5 kg)     Kidney Function Lab Results  Component Value Date/Time   CREATININE 1.24 10/03/2020 12:32 PM   CREATININE 1.07 07/31/2020 01:25 PM   CREATININE 0.77 07/23/2020 06:14 AM   CREATININE 0.75 07/22/2020 05:32 AM   CREATININE 1.03 07/03/2020 01:02 PM   CREATININE 0.9 08/27/2017 09:15 AM   CREATININE 0.9 05/27/2017 07:34 AM   GFR 78.21 02/18/2018 09:01 AM   GFRNONAA 60 (L) 10/03/2020 12:32 PM   GFRNONAA 66 07/31/2020 01:25 PM   GFRAA 77 07/31/2020 01:25 PM     BMP Latest Ref Rng & Units 10/03/2020 07/31/2020 07/23/2020  Glucose 70 - 99 mg/dL 114(H) 114(H) 92  BUN 8 - 23 mg/dL 16 18 21   Creatinine 0.61 - 1.24 mg/dL 1.24 1.07 0.77  BUN/Creat Ratio 6 - 22 (calc) - NOT APPLICABLE -  Sodium A999333 - 145 mmol/L 140 138 135  Potassium 3.5 - 5.1 mmol/L 3.9 4.7 3.7  Chloride 98 - 111 mmol/L 103 104 105  CO2 22 - 32 mmol/L 25 24 22   Calcium 8.9 - 10.3 mg/dL 9.3 9.3 8.8(L)    . Current antihypertensive regimen:  o Amlodipine (NORVASC) 2.5 mg tablet daily  o Metoprolol tartrate (LOPRESSOR) 25 mg tablet twice daily . How often are you checking your Blood Pressure? 1-2x per week . Current home BP readings:  o 01/18 138/70 o 01/26 139/78 . What recent interventions/DTPs have been made by any provider to improve Blood Pressure control since last CPP Visit: None . Any recent hospitalizations or ED visits since last visit  with CPP? No . What diet changes have been made to improve Blood Pressure Control?  o No Change . What exercise is being done to improve your Blood Pressure Control?  o No Change  Adherence Review: Is the patient currently on ACE/ARB medication? No Does the patient have >5 day gap between last estimated fill dates? No  Follow-Up:  Pharmacist Review   I spoke with the patient and his wife and discussed medication adherence with the patient, no issues at this time with current medication. His wife states that a home health nurse comes out once a week. He denies ED visits since his last CPP follow-up. The patient denies any side effects with his medication. Also, denies any problems with his current pharmacy.  Maia Breslow, [place] Assistant [phone]

## 2020-10-21 ENCOUNTER — Encounter: Payer: Medicare Other | Admitting: Cardiovascular Disease

## 2020-10-24 ENCOUNTER — Ambulatory Visit (INDEPENDENT_AMBULATORY_CARE_PROVIDER_SITE_OTHER)
Admission: RE | Admit: 2020-10-24 | Discharge: 2020-10-24 | Disposition: A | Discharge status: Home / Self Care | Payer: Medicare Other | Source: Ambulatory Visit | Attending: Internal Medicine | Admitting: Internal Medicine

## 2020-10-24 ENCOUNTER — Other Ambulatory Visit: Payer: Self-pay | Admitting: Oncology

## 2020-10-24 ENCOUNTER — Other Ambulatory Visit: Payer: Self-pay

## 2020-10-24 DIAGNOSIS — J432 Centrilobular emphysema: Secondary | ICD-10-CM | POA: Diagnosis not present

## 2020-10-24 DIAGNOSIS — I712 Thoracic aortic aneurysm, without rupture: Secondary | ICD-10-CM | POA: Diagnosis not present

## 2020-10-24 DIAGNOSIS — C61 Malignant neoplasm of prostate: Secondary | ICD-10-CM

## 2020-10-24 DIAGNOSIS — R0902 Hypoxemia: Secondary | ICD-10-CM | POA: Diagnosis not present

## 2020-10-24 DIAGNOSIS — J9611 Chronic respiratory failure with hypoxia: Secondary | ICD-10-CM

## 2020-10-24 DIAGNOSIS — I2510 Atherosclerotic heart disease of native coronary artery without angina pectoris: Secondary | ICD-10-CM | POA: Diagnosis not present

## 2020-10-24 DIAGNOSIS — J849 Interstitial pulmonary disease, unspecified: Secondary | ICD-10-CM

## 2020-10-25 ENCOUNTER — Telehealth: Payer: Medicare Other

## 2020-10-26 ENCOUNTER — Other Ambulatory Visit: Payer: Self-pay | Admitting: Oncology

## 2020-10-26 DIAGNOSIS — C61 Malignant neoplasm of prostate: Secondary | ICD-10-CM

## 2020-10-28 NOTE — Progress Notes (Signed)
07/25/20- 2 yoM former smoker seen at request of Noe Gens, NP for sleep evaluation and hosp f/u. Hosp 11/4-11/9  Acute Hypoxemic Respiratory Failure, UIP, Emphysema, dCHF w pulmonary edema, E. Coli bacteremia, Chronic AFib/ Pacemaker, Prostate Cancer, Aortic Aneurysm. He improved with diuresis. Alfredo Martinez, NP refers for sleep evaluation but also notes intention that patient have PFT and HRCT Epwoirth score- 14 Body weight today- Covid vax- 3Phizer Flu vax- had -----pt states snores, and stops breathing Wife here,confirming snoring, witnessed apneas and daytime somnolence. No hx ENT surgery. No sleep med. 2 cups AM coffee. No significant parasomnias.  Since recent hosp with diuresis he has felt much improd with less edema, nocturia only x 1 now, little cough or wheeze. No home O2.   10/29/20- 79 yoM former smoker followed for OSA, COPD, hx Respiratory Failure, dCHF w pulmonary edema, hx E.Coli Bacteremia, Chronic AFib/ Pacemaker, Prostate Cancer, Aortic Aneurysm -Advair 100, Ventolin hfa,   Prednisone 5 mg daily(Heme/Onc) HST 09/02/20- AHI 40.8/ hr, desaturation to 66%/ avbg 88%, body weight 228 lbs Pending CPAP titration w O2 check on 2/22 PFT 09/09/20- Minimal obstruction, no resp to BD, Min restriction, Moderate DLCO deficit F/F 0.76, TLC 76%, DLCO 61% HRCT chest 10/24/20-                                                     Wife here IMPRESSION: 1. Moderate centrilobular emphysema. 2. Minimal scarring of the dependent lung bases without specific findings to suggest fibrotic interstitial lung disease. 3. Cardiomegaly and coronary artery disease. 4. Enlargement of the tubular ascending thoracic aorta, measuring up to 4.9 x 4.8 cm. Ascending thoracic aortic aneurysm. Recommend semi-annual imaging followup by CTA or MRA and referral to cardiothoracic surgery if not already obtained. This recommendation follows 2010 ACCF/AHA/AATS/ACR/ASA/SCA/SCAI/SIR/STS/SVM Guidelines for the Diagnosis and  Management of Patients With Thoracic Aortic Disease. Circulation. 2010; 121: J191-Y782. Aortic aneurysm NOS (ICD10-I71.9) Aortic Atherosclerosis (ICD10-I70.0) and Emphysema (ICD10-J43.9). Body weight today-222 lbs Ccovid vax-3 Phizer Flu vax-had  We reviewed sleep study, noting sustained hypoxemia as reason we need in-center CPAP titration, in case he needs O2. Study is pending this month. We reviewed PFT. Little obstructive airways disease, but emphysema seen on CT likely corresponds to his reduced DLCO. His cardiologist follows CAD and Aortic Aneurysm.  ROS-see HPI   + = positive Constitutional:    weight loss, night sweats, fevers, chills, fatigue, lassitude. HEENT:    headaches, difficulty swallowing, tooth/dental problems, sore throat,       sneezing, itching, ear ache, nasal congestion, post nasal drip, snoring CV:    chest pain, orthopnea, PND, swelling in lower extremities, anasarca,                                   dizziness, +palpitations Resp:   +shortness of breath with exertion or at rest.                productive cough,   non-productive cough, coughing up of blood.              change in color of mucus.  wheezing.   Skin:    rash or lesions. GI:  + heartburn, indigestion, abdominal pain, nausea, vomiting, diarrhea,  change in bowel habits, loss of appetite GU: dysuria, change in color of urine, no urgency or frequency.   flank pain. MS:   joint pain, stiffness, decreased range of motion, back pain. Neuro-     nothing unusual Psych:  change in mood or affect.  depression or +anxiety.   memory loss.  OBJ- Physical Exam General- Alert, Oriented, Affect-appropriate, Distress- none acute, + obese Skin- rash-none, lesions- none, excoriation- none Lymphadenopathy- none Head- atraumatic            Eyes- Gross vision intact, PERRLA, conjunctivae and secretions clear            Ears- Hearing, canals-normal            Nose- Clear, no-Septal dev, mucus, polyps,  erosion, perforation             Throat- Mallampati IV , mucosa clear , drainage- none, tonsils- atrophic, + teeth Neck- flexible , trachea midline, no stridor , thyroid nl, carotid no bruit Chest - symmetrical excursion , unlabored           Heart/CV- RRR , no murmur , no gallop  , no rub, nl s1 s2                           - JVD- none , edema+trace, stasis changes- none, varices- none           Lung- +clear, wheeze- none, cough- none , dullness-none, rub- none           Chest wall- +pacemaker L  Abd-  Br/ Gen/ Rectal- Not done, not indicated Extrem- cyanosis- none, clubbing, none, atrophy- none, strength- nl Neuro- grossly intact to observation

## 2020-10-29 ENCOUNTER — Other Ambulatory Visit: Payer: Self-pay

## 2020-10-29 ENCOUNTER — Ambulatory Visit: Payer: Medicare Other | Admitting: Internal Medicine

## 2020-10-29 ENCOUNTER — Encounter: Payer: Self-pay | Admitting: Internal Medicine

## 2020-10-29 DIAGNOSIS — G4733 Obstructive sleep apnea (adult) (pediatric): Secondary | ICD-10-CM

## 2020-10-29 DIAGNOSIS — I714 Abdominal aortic aneurysm, without rupture, unspecified: Secondary | ICD-10-CM

## 2020-10-29 DIAGNOSIS — J449 Chronic obstructive pulmonary disease, unspecified: Secondary | ICD-10-CM | POA: Diagnosis not present

## 2020-10-29 NOTE — Patient Instructions (Signed)
Keep appointment for CPAP titration overnight sleep study at the sleep center as planned. That will tell us how to set your CPAP pressure, and whether you also need oxygen with it. We should have all that information by 2 weeks after the sleep study. You can call us then if we haven't called you, to get started.   Please feel free to call us if we can help.

## 2020-10-29 NOTE — Assessment & Plan Note (Signed)
Followed by cardiology 

## 2020-10-29 NOTE — Assessment & Plan Note (Signed)
We need in-center CPAP titration for O2 documentation due to nocturnal hypoxemia demonstrated on HST

## 2020-10-29 NOTE — Assessment & Plan Note (Signed)
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 Surprisingly little obstruction on PFT

## 2020-11-01 ENCOUNTER — Ambulatory Visit (INDEPENDENT_AMBULATORY_CARE_PROVIDER_SITE_OTHER): Payer: Medicare Other | Admitting: Pharmacist

## 2020-11-01 DIAGNOSIS — I10 Essential (primary) hypertension: Secondary | ICD-10-CM

## 2020-11-01 DIAGNOSIS — E7800 Pure hypercholesterolemia, unspecified: Secondary | ICD-10-CM | POA: Diagnosis not present

## 2020-11-01 DIAGNOSIS — R7303 Prediabetes: Secondary | ICD-10-CM

## 2020-11-01 DIAGNOSIS — G4700 Insomnia, unspecified: Secondary | ICD-10-CM

## 2020-11-01 NOTE — Progress Notes (Signed)
Chronic Care Management Pharmacy Note  11/11/2020 Name:  Tony [name] MRN:  [id number] DOB:  [birth year]  Subjective: Tony [name] is an 80 y.o. year old male who is a primary patient of [name], [name], MD.  The CCM team was consulted for assistance with disease management and care coordination needs.    Engaged with patient by telephone for follow up visit in response to provider referral for pharmacy case management and/or care coordination services.   Consent to Services:  The patient was given information about Chronic Care Management services, agreed to services, and gave verbal consent prior to initiation of services.  Please see initial visit note for detailed documentation.   Patient Care Team: [name], [name], MD as PCP - General (Family Medicine) Sanda Klein, MD as PCP - Cardiology (Cardiology) Wyatt Portela, MD as Consulting Physician (Oncology) Viona Gilmore, [facility] as Pharmacist (Pharmacist)  Recent office visits: 07/31/20 [name], MD: Patient presented for hospitalization follow up.   06/19/20 Charlott Rakes, LPN: Patient presented for medicare annual wellness visit.  05/21/20 [name], MD: Patient presented for chronic conditions follow up. Influenza vaccine administered.  Recent consult visits: 10/29/20 Baird Lyons, MD (pulmonary): Patient presented for OSA follow up.   10/03/20 Wayna Chalet, MD (oncology): Patient presented for prostate neoplasm follow up and Eligard infusion.  09/09/20 Baird Lyons, MD (pulmonary): Patient presented for follow up and repeat pulmonary function test.  08/07/20 Coletta Memos, NP (cardiology): Patient presented for SSS follow up.   [name] visits: 11/4-11/9/21 Patient was admitted for chest pain and hypoxia.  Objective:  Lab Results  Component Value Date   CREATININE 1.24 10/03/2020   BUN 16 10/03/2020   GFR 78.21 02/18/2018   GFRNONAA 60 (L) 10/03/2020   GFRAA 77 07/31/2020   NA 140  10/03/2020   K 3.9 10/03/2020   CALCIUM 9.3 10/03/2020   CO2 25 10/03/2020    Lab Results  Component Value Date/Time   HGBA1C 5.9 (H) 07/19/2020 05:58 AM   HGBA1C 5.6 05/21/2020 09:52 AM   HGBA1C 6.2 02/13/2019 10:54 AM   GFR 78.21 02/18/2018 09:01 AM   GFR 93.27 01/21/2018 10:12 AM    Last diabetic Eye exam: No results found for: HMDIABEYEEXA  Last diabetic Foot exam: No results found for: HMDIABFOOTEX   Lab Results  Component Value Date   CHOL 139 05/21/2020   HDL 51 05/21/2020   LDLCALC 71 05/21/2020   TRIG 89 05/21/2020   CHOLHDL 2.7 05/21/2020    Hepatic Function Latest Ref Rng & Units 10/03/2020 07/31/2020 07/23/2020  Total Protein 6.5 - 8.1 g/dL 7.2 6.4 6.3(L)  Albumin 3.5 - 5.0 g/dL 4.0 - 3.4(L)  AST 15 - 41 U/L 32 20 83(H)  ALT 0 - 44 U/L 20 40 211(H)  Alk Phosphatase 38 - 126 U/L 84 - 97  Total Bilirubin 0.3 - 1.2 mg/dL 0.7 0.9 1.2  Bilirubin, Direct 0.0 - 0.2 mg/dL - - -    Lab Results  Component Value Date/Time   TSH 0.76 05/13/2016 11:55 AM    CBC Latest Ref Rng & Units 10/03/2020 07/31/2020 07/22/2020  WBC 4.0 - 10.5 K/uL 6.0 10.5 10.9(H)  Hemoglobin 13.0 - 17.0 g/dL 15.1 14.8 13.2  Hematocrit 39.0 - 52.0 % 46.0 43.4 40.4  Platelets 150 - 400 K/uL 172 230 149(L)    No results found for: VD25OH  Clinical ASCVD: Yes  The 10-year ASCVD risk score Mikey Bussing DC Jr., et al., 2013) is: 21.2%   Values  used to calculate the score:     Age: 61 years     Sex: Male     Is Non-Hispanic African American: No     Diabetic: No     Tobacco smoker: No     Systolic Blood Pressure: 510 mmHg     Is BP treated: Yes     HDL Cholesterol: 51 mg/dL     Total Cholesterol: 139 mg/dL    Depression screen [facility] 2/9 07/30/2020 06/19/2020 05/23/2020  Decreased Interest 0 0 0  Down, Depressed, Hopeless 0 0 0  PHQ - 2 Score 0 0 0  Altered sleeping - - -  Tired, decreased energy - - -  Change in appetite - - -  Feeling bad or failure about yourself  - - -  Trouble concentrating -  - -  Moving slowly or fidgety/restless - - -  Suicidal thoughts - - -  PHQ-9 Score - - -  Some recent data might be hidden      Social History   Tobacco Use  Smoking Status Former Smoker  . Years: 60.00  . Types: Cigars  . Quit date: 09/14/2012  . Years since quitting: 8.1  Smokeless Tobacco Never Used  Tobacco Comment   little cigars; the small ones"   BP Readings from Last 3 Encounters:  10/29/20 100/80  10/03/20 (!) 129/95  08/07/20 119/74   Pulse Readings from Last 3 Encounters:  10/29/20 86  10/03/20 89  08/07/20 79   Wt Readings from Last 3 Encounters:  11/05/20 228 lb (103.4 kg)  10/29/20 222 lb 9.6 oz (101 kg)  10/03/20 220 lb 14.4 oz (100.2 kg)    Assessment/Interventions: Review of patient past medical history, allergies, medications, health status, including review of consultants reports, laboratory and other test data, was performed as part of comprehensive evaluation and provision of chronic care management services.   SDOH:  (Social Determinants of Health) assessments and interventions performed: Yes   SDOH Interventions   Flowsheet Row Most Recent Value  SDOH Interventions   Financial Strain Interventions Other (Comment)  [working on patient assistance]     CCM Care Plan  Allergies  Allergen Reactions  . Penicillins Rash    Has patient had a PCN reaction causing immediate rash, facial/tongue/throat swelling, SOB or lightheadedness with hypotension: Yes Has patient had a PCN reaction causing severe rash involving mucus membranes or skin necrosis: No Has patient had a PCN reaction that required hospitalization: No Has patient had a PCN reaction occurring within the last 10 years: No If all of the above answers are "NO", then may proceed with Cephalosporin use.    Medications Reviewed Today    Reviewed by Billie Ruddy, MD (Physician) on 11/07/20 at [place] List Status: <None>  Medication Order Taking? Sig Documenting Provider Last Dose  Status Informant  abiraterone acetate (ZYTIGA) 250 MG tablet [id number] No TAKE 4 TABLETS (1,000 MG TOTAL) BY MOUTH DAILY. TAKE ON AN EMPTY STOMACH 1 HOUR BEFORE OR 2 HOURS AFTER A MEAL  Patient not taking: Reported on 11/07/2020   Wyatt Portela, MD Not Taking Active   acetaminophen (TYLENOL) 500 MG tablet [id number] Yes Take 1,000 mg by mouth every 6 (six) hours as needed for headache (pain).  [provider] Taking Active Spouse/Significant Other  albuterol (VENTOLIN HFA) 108 (90 Base) MCG/ACT inhaler [id number] Yes TAKE 2 PUFFS BY MOUTH EVERY 6 HOURS AS NEEDED FOR WHEEZE OR SHORTNESS OF BREATH [name], [name], MD Taking Active  amLODipine (NORVASC) 2.5 MG tablet [id number] Yes TAKE 1 TABLET BY MOUTH EVERY DAY  Patient taking differently: Take 2.5 mg by mouth daily.   [name], [name], MD Taking Active            Med Note Damita Dunnings, MACI D   Fri Jul 19, 2020 10:00 AM) LF 9.11.21 #90  Ascorbic Acid (VITAMIN C PO) [id number] No Take 1 tablet by mouth daily.  Patient not taking: Reported on 11/07/2020   [provider] Not Taking Active Spouse/Significant Other  aspirin EC 81 MG tablet [id number] Yes Take 81 mg by mouth daily. [provider] Taking Active Multiple Informants  atorvastatin (LIPITOR) 20 MG tablet [id number] Yes TAKE 1 TABLET BY MOUTH EVERY DAY Croitoru, Mihai, MD Taking Active   azithromycin (ZITHROMAX) 250 MG tablet [id number] Yes Take 2 tablets on day 1.  Then take 1 tab on days 2 through 5. Billie Ruddy, MD  Active   carbamide peroxide (DEBROX) 6.5 % OTIC solution [id number] No Place 5 drops into both ears daily as needed (for earwax).  Patient not taking: Reported on 11/07/2020   [provider] Not Taking Active Multiple Informants           Med Note Damita Dunnings, MACI D   Fri Jul 19, 2020 10:02 AM) LF 05/31/20 #90  citalopram (CELEXA) 10 MG tablet [id number] Yes TAKE 1 TABLET BY MOUTH EVERY DAY  Patient taking differently: Take 10 mg by mouth daily.    [name], [name], MD Taking Active   diclofenac sodium (VOLTAREN) 1 % GEL [id number] Yes Apply 4 g topically 4 (four) times daily.  Patient taking differently: Apply 4 g topically 4 (four) times daily as needed (pain).   [name], [name], MD Taking Active   docusate sodium (COLACE) 100 MG capsule [id number] Yes Take 100 mg by mouth daily with supper. [provider] Taking Active Spouse/Significant Other           Med Note Knox Royalty   Tue Jul 30, 2020 12:05 PM) with wife, [name], on [facility] DPR, takes prn- "usually" once a day  fluticasone (FLONASE) 50 MCG/ACT nasal spray [id number] Yes PLACE 1 SPRAY INTO BOTH NOSTRILS DAILY AS NEEDED FOR ALLERGIES OR RHINITIS (AT BEDTIME).  Patient taking differently: Place 1 spray into both nostrils at bedtime as needed for allergies or rhinitis.   [name], [name], MD Taking Active   Fluticasone-Salmeterol (ADVAIR) 100-50 MCG/DOSE AEPB [id number] Yes Inhale 1 puff into the lungs 2 (two) times daily. [name], [name], MD Taking Active Multiple Informants           Med Note Damita Dunnings, MACI D   Fri Jul 19, 2020 10:04 AM) LF 07/15/20 #60  furosemide (LASIX) 20 MG tablet [id number] Yes TAKE 1 TABLET BY MOUITH DAILY FOR 5 DAYS , THEN DAILY AS NEEDED FOR EDEMA [name], [name], MD Taking Active   LORazepam (ATIVAN) 1 MG tablet [id number] Yes TAKE 1 TABLET (1 MG TOTAL) BY MOUTH 2 (TWO) TIMES DAILY AS NEEDED. [name], [name], MD Taking Active   metoprolol tartrate (LOPRESSOR) 25 MG tablet [id number] Yes TAKE 1 TABLET BY MOUTH TWICE A DAY Croitoru, Mihai, MD Taking Active   Multiple Vitamin (MULTIVITAMIN WITH MINERALS) TABS tablet [id number] Yes Take 0.5 tablets by mouth every other day. Centrum [provider] Taking Active Multiple Informants  omeprazole (PRILOSEC) 20 MG capsule [id number] Yes TAKE 1 CAPSULE (20 MG TOTAL) BY MOUTH DAILY BEFORE BREAKFAST. [name], [name], MD Taking Active Multiple  Informants           Med Note Damita Dunnings, MACI D   Fri Jul 19, 2020 10:03  AM) LF 06/03/20 #90  predniSONE (DELTASONE) 5 MG tablet [id number] Yes TAKE 1 TABLET BY MOUTH EVERY DAY WITH BREAKFAST Wyatt Portela, MD Taking Active   sulfamethoxazole-trimethoprim (BACTRIM DS) 800-160 MG tablet [id number] Yes Take 2 tablets by mouth every 12 (twelve) hours. Cherene Altes, MD Taking Active            Med Note Knox Royalty   Tue Jul 30, 2020 12:09 PM) with wife, [name], on [name] DPR, patient took as prescribed and completed total prescribed days/ doses on 07/29/20  Tetrahydrozoline HCl (VISINE OP) [id number] Yes Place 1 drop into both eyes daily as needed (red eyes). [provider] Taking Active Spouse/Significant Other          Patient Active Problem List   Diagnosis Date Noted  . OSA (obstructive sleep apnea) 07/25/2020  . 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 Morbid obesity ([place]) 11/14/2019  . Prediabetes 11/14/2019  . Varicose veins of both lower extremities 05/17/2019  . Bilateral lower extremity edema 03/13/2019  . Hypokalemia 03/13/2019  . 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 SSS (sick sinus syndrome) ([place]) 06/14/2018  . 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 Coronary artery disease involving native coronary artery of native heart without angina pectoris 06/08/2018  . Sick sinus syndrome ([place]) 06/08/2018  . 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 COPD mixed type ([place]) 02/03/2018  . 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 Acute appendicitis 01/21/2018  . Perforated appendicitis 01/21/2018  . Chronic fatigue 06/01/2017  . Essential hypertension, benign 07/09/2016  . 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[place]) 07/06/2016  . Coronary artery calcification seen on CAT scan 05/15/2016  . Anxiety disorder 04/08/2016  . Insomnia 04/08/2016  . 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 Tobacco use disorder 04/08/2016  . Hypercholesterolemia 04/08/2016  . Malignant neoplasm of  prostate ([place]) 02/23/2014    Immunization History  Administered Date(s) Administered  . Fluad Quad(high Dose 65+) 05/17/2019, 05/21/2020  . Influenza, High Dose Seasonal PF 05/05/2016, 06/01/2017, 06/22/2018  . PFIZER(Purple Top)SARS-COV-2 Vaccination 10/05/2019, 10/26/2019, 06/18/2020  . Pneumococcal Polysaccharide-23 07/31/2020  . Td 02/13/2019    Conditions to be addressed/monitored:  Hypertension, Hyperlipidemia, Coronary Artery Disease, Anxiety and prostate cancer, insomnia, and prediabetes  Care Plan : [place]  Updates made by Viona Gilmore, [place] since 11/11/2020 12:00 AM    Problem: Problem: Hypertension, Hyperlipidemia, Coronary Artery Disease, Anxiety and prostate cancer, insomnia, and prediabetes     Long-Range Goal: Patient-Specific Goal   Start Date: 11/01/2020  Expected End Date: 11/01/2021  This Visit's Progress: On track  Priority: High  Note:   Current Barriers:  . Unable to independently afford treatment regimen . Unable to independently monitor therapeutic efficacy  Pharmacist Clinical Goal(s):  [name] Over the next 120 days, patient will verbalize ability to afford treatment regimen . achieve adherence to monitoring guidelines and medication adherence to achieve therapeutic efficacy through collaboration with PharmD and provider.   Interventions: . 1:1 collaboration with [name], [name], MD regarding development and update of comprehensive plan of care as evidenced by provider attestation and co-signature . Inter-disciplinary care team collaboration (see longitudinal plan of care) . Comprehensive medication review performed; medication list updated in electronic medical record  Hypertension (BP goal <140/90) -Controlled -Current treatment:  Amlodipine 2.5 mg daily  Furosemide 20 mg daily PRN   Metoprolol tartrate 25 mg twice daily -Medications previously tried: n/a  -Current home readings: 119/74, 118/62 (week before last) -Current  dietary habits: did not discuss -Current exercise habits:  PT exercises a couple times a week (not every day) -Denies hypotensive/hypertensive symptoms -Educated on Exercise goal of 150 minutes per week; Importance of home blood pressure monitoring; -Counseled to monitor BP at home weekly, document, and provide log at future appointments -Recommended to continue current medication  Hyperlipidemia/Coronary artery disease: (LDL goal < 70) -Not ideally controlled -Current treatment:  Aspirin 81 mg daily   Atorvastatin 20 mg daily  -Medications previously tried: none -Current dietary patterns: did not discuss -Current exercise habits: PT exercises a couple times a week (not every day) -Educated on Cholesterol goals;  Exercise goal of 150 minutes per week; -Counseled on diet and exercise extensively Recommended to continue current medication  Emphysema lung (Goal: control symptoms and prevent exacerbations) -Controlled -Current treatment   Albuterol 108 mcg/act 2 puff q6hr PRN   Advair 100-50 mcg/dose 1 puff twice daily -Medications previously tried: n/a  -Pulmonary function testing: n/a -Exacerbations requiring treatment in last 6 months: none -Patient reports consistent use of maintenance inhaler -Frequency of rescue inhaler use: every other day -Counseled on When to use rescue inhaler Differences between maintenance and rescue inhalers -Recommended to discuss with Dr. Annamaria Boots about increasing dose of Advair for better day to day control of symptoms Assessed patient finances. Patient may qualify for patient assistance depending on use of brand name inhalers.  Pre-diabetes (A1c goal <6.5%) -Controlled -Current medications: . No medications -Medications previously tried: none  -Current home glucose readings . fasting glucose: n/a . post prandial glucose: n/a -Denies hypoglycemic/hyperglycemic symptoms -Current exercise: PT exercises a couple times a week (not every  day) -Educated onA1c and blood sugar goals; -Counseled to check feet daily and get yearly eye exams -Counseled on diet and exercise extensively  Depression (Goal: minimize symptoms of depression) -Controlled -Current treatment:  Citalopram 10 mg daily   Lorazepam 1 mg BID PRN (twice daily consistently)  -Medications previously tried/failed: none -PHQ9: 0 -GAD7: n/a -Educated on Benefits of medication for symptom control -Recommended to continue current medication Counseled on long term risks associated with benzodiazepines such as falls, fractures, and memory loss  Prostate cancer -Controlled -Current treatment   Abiraterone (Zytiga) 250 mg 4 tablets daily (AM)   Prednisone 5 mg daily (AM)  -Medications previously tried: n/a  -Recommended to continue current medication  GERD (Goal: minimize symptoms of heartburn or acid reflux) -Controlled -Current treatment  . Omeprazole 20 mg daily before breakfast -Medications previously tried: none  -Counseled on diet and exercise extensively Recommended to continue current medication   Health Maintenance -Vaccine gaps: Shingles -Current therapy:   Acetaminophen 500 mg q6hr PRN   Carbamide peroxide 6.5% otic solution  Flonase Nasal Spray 50 mcg/act 1 spray daily PRN   Multivitamin 1/2 tablet daily (Centrum)   Miralax 17g daily PRN (rarely uses)   Potassium Chloride SA 20 mEq BID (stopped in July)  Senna 8.6 mg 2 tablets daily as needed  -Educated on Cost vs benefit of each product must be carefully weighed by individual consumer -Patient is satisfied with current therapy and denies issues -Recommended to continue current medication  Patient Goals/Self-Care Activities . Over the next 120 days, patient will:  - take medications as prescribed check blood pressure weekly, document, and provide at future appointments  Follow Up Plan: Telephone follow up appointment with  care management team member scheduled for:      Medication Assistance: Application for Advair HFA and Ventolin HFA  medication assistance program. in process.  Anticipated assistance start date unknown where patient is in application process.  See plan of care for additional detail.  Patient's preferred pharmacy is:  [id number] [id number]: [phone]: [phone] [address] [id number]: [phone]: [phone] Uses pill box? Yes Pt endorses 100% compliance  We discussed: Current pharmacy is preferred with insurance plan and patient is satisfied with pharmacy services Patient decided to: Continue current medication management strategy  Care Plan and Follow Up Patient Decision:  Patient agrees to Care Plan and Follow-up.  Plan: Telephone follow up appointment with care management team member scheduled for:  4 months  MJeni Salles PharmD BDepauvillePharmacist [place]at [phone]

## 2020-11-05 ENCOUNTER — Ambulatory Visit (HOSPITAL_BASED_OUTPATIENT_CLINIC_OR_DEPARTMENT_OTHER): Payer: Medicare Other | Attending: Internal Medicine | Admitting: Internal Medicine

## 2020-11-05 ENCOUNTER — Other Ambulatory Visit: Payer: Self-pay

## 2020-11-05 VITALS — Ht 68.0 in | Wt 228.0 lb

## 2020-11-05 DIAGNOSIS — I4891 Unspecified atrial fibrillation: Secondary | ICD-10-CM | POA: Insufficient documentation

## 2020-11-05 DIAGNOSIS — G4733 Obstructive sleep apnea (adult) (pediatric): Secondary | ICD-10-CM | POA: Insufficient documentation

## 2020-11-05 DIAGNOSIS — J449 Chronic obstructive pulmonary disease, unspecified: Secondary | ICD-10-CM | POA: Diagnosis not present

## 2020-11-07 ENCOUNTER — Other Ambulatory Visit: Payer: Self-pay

## 2020-11-07 ENCOUNTER — Telehealth: Payer: Self-pay

## 2020-11-07 ENCOUNTER — Encounter: Payer: Self-pay | Admitting: Family Medicine

## 2020-11-07 ENCOUNTER — Telehealth (INDEPENDENT_AMBULATORY_CARE_PROVIDER_SITE_OTHER): Payer: Medicare Other | Admitting: Family Medicine

## 2020-11-07 VITALS — Temp 97.8°F

## 2020-11-07 DIAGNOSIS — J441 Chronic obstructive pulmonary disease with (acute) exacerbation: Secondary | ICD-10-CM | POA: Diagnosis not present

## 2020-11-07 DIAGNOSIS — J439 Emphysema, unspecified: Secondary | ICD-10-CM

## 2020-11-07 MED ORDER — AZITHROMYCIN 250 MG PO TABS: ORAL_TABLET | ORAL | 0 refills | Status: DC

## 2020-11-07 NOTE — Progress Notes (Signed)
Virtual Visit via Telephone Note  I connected with Tony [name] on 11/07/20 at  2:00 PM EST by telephone and verified that I am speaking with the correct person using two identifiers.   I discussed the limitations, risks, security and privacy concerns of performing an evaluation and management service by telephone and the availability of in person appointments. I also discussed with the patient that there may be a patient responsible charge related to this service. The patient expressed understanding and agreed to proceed.  Location patient: home Location provider: work or home office Participants present for the call: patient, provider Patient did not have a visit in the prior 7 days to address this/these issue(s).   History of Present Illness: Pt is a 80 yo male with pmh sig for AAA, CAD, SSS, OSA, GERD, h/o prostate cancer, h/o appendicitis, emyphsema, COPD.  Per pt's wife pt coughing up yellow sputum 1.5 -2 wks. moderate centrilobular emphysema noted on CT 10/24/2020.  Had Pulmonology visit 2/15 and sleep study on 2/22. Tried nyquil.  Using inhalers.  Denies fever, chills.  Allergy to penicillins.   Observations/Objective: Patient sounds cheerful and well on the phone. I do not appreciate any SOB or coughing. Speech and thought processing are grossly intact. Patient reported vitals:  Assessment and Plan: COPD with acute exacerbation ([place]) -Concern for possible COPD exacerbation. -Continue inhalers -We will start azithromycin 2/2 penicillin allergy -Follow-up with pulmonology -CT chest high-resolution from 10/24/2020 with moderate centrilobular emphysema.  Minimal scarring dependent lung bases without specific findings to suggest fibrotic interstitial lung disease.  Cardiomegaly and coronary artery disease.  Enlargement of the tubular ascending thoracic aorta measuring 4.9 x 4.8 cm ascending thoracic aortic aneurysm.  Recommended semiannual imaging follow-up by CT or MRA and  referral to CT surgery if not already obtained. - Plan: azithromycin (ZITHROMAX) 250 MG tablet  Pulmonary emphysema, unspecified emphysema type ([place])    Follow Up Instructions:  Follow-up with pulmonology, Dr. Annamaria Boots and PCP encouraged.  I did not refer this patient for an OV in the next 24 hours for this/these issue(s).  I discussed the assessment and treatment plan with the patient. The patient was provided an opportunity to ask questions and all were answered. The patient agreed with the plan and demonstrated an understanding of the instructions.   The patient was advised to call back or seek an in-person evaluation if the symptoms worsen or if the condition fails to improve as anticipated.  I provided 12 minutes of non-face-to-face time during this encounter.   Billie Ruddy, MD

## 2020-11-07 NOTE — Telephone Encounter (Signed)
Oral Oncology Patient Advocate Encounter  Met patient in lobby to complete application for Wynetta Emery and [employer] in an effort to reduce patient's out of pocket expense for Zytiga to $0.    Application completed and faxed to [phone].   JJPAF patient assistance phone number for follow up is [phone].   This encounter will be updated until final determination.   [place] Patient [place] Phone [phone] Fax [phone] 11/07/2020 9:59 AM

## 2020-11-10 DIAGNOSIS — G4733 Obstructive sleep apnea (adult) (pediatric): Secondary | ICD-10-CM

## 2020-11-10 NOTE — Procedures (Signed)
    Patient Name: Tony [name], Tony [name] Date: 11/05/2020 Gender: Male D.O.B: 04/24/1941 Age (years): 59 Referring Provider: Baird Lyons MD, ABSM Height (inches): 68 Interpreting Physician: Baird Lyons MD, ABSM Weight (lbs): 228 RPSGT: Carolin Coy BMI: 35 MRN: [id number] Neck Size: 17.50  CLINICAL INFORMATION The patient is referred for a BiPAP titration to treat sleep apnea.  Date of NPSG, Split Night or HST: NPSG 09/02/20  AHI 40.8/ hr, desaturation to 66%, body weight 228 lbs  SLEEP STUDY TECHNIQUE As per the AASM Manual for the Scoring of Sleep and Associated Events v2.3 (April 2016) with a hypopnea requiring 4% desaturations.  The channels recorded and monitored were frontal, central and occipital EEG, electrooculogram (EOG), submentalis EMG (chin), nasal and oral airflow, thoracic and abdominal wall motion, anterior tibialis EMG, snore microphone, electrocardiogram, and pulse oximetry. Bilevel positive airway pressure (BPAP) was initiated at the beginning of the study and titrated to treat sleep-disordered breathing.  MEDICATIONS Medications self-administered by patient taken the night of the study : none reported  RESPIRATORY PARAMETERS Optimal IPAP Pressure (cm): 25 AHI at Optimal Pressure (/hr) 0.0 Optimal EPAP Pressure (cm): 21   Overall Minimal O2 (%): 85.0 Minimal O2 at Optimal Pressure (%): 93.0 SLEEP ARCHITECTURE Start Time: 9:41:41 PM Stop Time: 4:49:55 AM Total Time (min): 428.2 Total Sleep Time (min): 281.5 Sleep Latency (min): 16.9 Sleep Efficiency (%): 65.7% REM Latency (min): 247.0 WASO (min): 129.8 Stage N1 (%): 30.9% Stage N2 (%): 55.4% Stage N3 (%): 0.0% Stage R (%): 13.7 Supine (%): 100.00 Arousal Index (/hr): 39.9   CARDIAC DATA The 2 lead EKG demonstrated sinus rhythm. The mean heart rate was 66.6 beats per minute. Other EKG findings include: PVCs.  LEG MOVEMENT DATA The total Periodic Limb Movements of Sleep (PLMS) were 0. The PLMS index was  0.0. A PLMS index of <15 is considered normal in adults.  IMPRESSIONS - CPAP titration provided inadequate control at tolerated pressures and was converted to BIPAP. - An optimal BiPAP pressure was selected for this patient ( 25 / 21 cm of water) - Central sleep apnea was not noted during this titration (CAI = 1.1/h). - Sustained oxygen desaturations were observed during this titration (min O2 = 85.0%). - Supplemental Oxygen was added per protocol to 3L/ minute. Minimum O2 saturation on BIPAP 25/21 with O2 3L was 93%. - The patient snored with soft snoring volume. - 2-lead EKG demonstrated: PVCs - Clinically significant periodic limb movements were not noted during this study.   DIAGNOSIS - Obstructive Sleep Apnea (G47.33)  RECOMMENDATIONS - Trial of BiPAP therapy on 25/21 cm H2O with a Medium size Resmed Full Face Mask Mirage Quattro mask and heated humidification. - Supplemental O2 through BIPAP at 3L/ min. - Be careful with alcohol, sedatives and other CNS depressants that may worsen sleep apnea and disrupt normal sleep architecture. - Sleep hygiene should be reviewed to assess factors that may improve sleep quality. - Weight management and regular exercise should be initiated or continued.  [Electronically signed] 11/10/2020 11:33 AM  Baird Lyons MD, ABSM Diplomate, American Board of Sleep Medicine   NPI: [id number]                         [place] [place] [facility]  ELECTRONICALLY SIGNED ON:  11/10/2020, 11:26 AM [place] PH: [phone]   FX: [phone] [place]

## 2020-11-11 ENCOUNTER — Telehealth: Payer: Self-pay | Admitting: Family Medicine

## 2020-11-11 DIAGNOSIS — Z87891 Personal history of nicotine dependence: Secondary | ICD-10-CM | POA: Diagnosis not present

## 2020-11-11 DIAGNOSIS — Z79899 Other long term (current) drug therapy: Secondary | ICD-10-CM | POA: Diagnosis not present

## 2020-11-11 DIAGNOSIS — I714 Abdominal aortic aneurysm, without rupture: Secondary | ICD-10-CM | POA: Diagnosis not present

## 2020-11-11 DIAGNOSIS — M6281 Muscle weakness (generalized): Secondary | ICD-10-CM | POA: Diagnosis not present

## 2020-11-11 DIAGNOSIS — I2510 Atherosclerotic heart disease of native coronary artery without angina pectoris: Secondary | ICD-10-CM | POA: Diagnosis not present

## 2020-11-11 DIAGNOSIS — J439 Emphysema, unspecified: Secondary | ICD-10-CM | POA: Diagnosis not present

## 2020-11-11 DIAGNOSIS — I10 Essential (primary) hypertension: Secondary | ICD-10-CM | POA: Diagnosis not present

## 2020-11-11 NOTE — Telephone Encounter (Signed)
Donata Duff from [facility] called to let Dr. [name] know that the patient was going to be discharged but the patient fell 2 weeks ago and they need to extend one more week.  [place] [phone]

## 2020-11-11 NOTE — Telephone Encounter (Signed)
Verbal given to [place].

## 2020-11-12 NOTE — Telephone Encounter (Signed)
Patient is temporarily approved for Zytiga at no cost from [facility] 11/11/20-01/10/21.  To be approved until 09/13/21, patient must sign Medicare Attestation form   [facility] uses [facility] [place] Phone [phone] Fax [phone] 11/12/2020 8:28 AM

## 2020-11-15 ENCOUNTER — Other Ambulatory Visit: Payer: Self-pay

## 2020-11-16 ENCOUNTER — Other Ambulatory Visit: Payer: Self-pay | Admitting: Family Medicine

## 2020-11-18 ENCOUNTER — Encounter: Payer: Self-pay | Admitting: Family Medicine

## 2020-11-18 ENCOUNTER — Other Ambulatory Visit: Payer: Self-pay

## 2020-11-18 ENCOUNTER — Ambulatory Visit (INDEPENDENT_AMBULATORY_CARE_PROVIDER_SITE_OTHER): Payer: Medicare Other | Admitting: Family Medicine

## 2020-11-18 VITALS — BP 126/70 | HR 89 | Temp 98.1°F | Resp 20 | Ht 68.0 in | Wt 218.4 lb

## 2020-11-18 DIAGNOSIS — I10 Essential (primary) hypertension: Secondary | ICD-10-CM | POA: Diagnosis not present

## 2020-11-18 DIAGNOSIS — J449 Chronic obstructive pulmonary disease, unspecified: Secondary | ICD-10-CM | POA: Diagnosis not present

## 2020-11-18 DIAGNOSIS — R059 Cough, unspecified: Secondary | ICD-10-CM | POA: Diagnosis not present

## 2020-11-18 DIAGNOSIS — F418 Other specified anxiety disorders: Secondary | ICD-10-CM

## 2020-11-18 DIAGNOSIS — Z0000 Encounter for general adult medical examination without abnormal findings: Secondary | ICD-10-CM | POA: Diagnosis not present

## 2020-11-18 DIAGNOSIS — G4733 Obstructive sleep apnea (adult) (pediatric): Secondary | ICD-10-CM

## 2020-11-18 DIAGNOSIS — I495 Sick sinus syndrome: Secondary | ICD-10-CM

## 2020-11-18 MED ORDER — BENZONATATE 100 MG PO CAPS: 200.0000 mg | ORAL_CAPSULE | Freq: Every day | ORAL | 0 refills | Status: AC | PRN

## 2020-11-18 MED ORDER — FLUTICASONE-SALMETEROL 250-50 MCG/DOSE IN AEPB: 1.0000 | INHALATION_SPRAY | Freq: Two times a day (BID) | RESPIRATORY_TRACT | 2 refills | Status: DC

## 2020-11-18 NOTE — Assessment & Plan Note (Addendum)
He is waiting for BiPaP machine.We discussed benefits if he is complaint with wearing it as instructed. Following with Dr Annamaria Boots.

## 2020-11-18 NOTE — Assessment & Plan Note (Signed)
Problem is well controlled. Continue Celexa 10 mg daily and lorazepam 1 mg twice daily as needed. We had discussed some side effects of medications.

## 2020-11-18 NOTE — Assessment & Plan Note (Signed)
BP adequately controlled. Continue metoprolol tartrate 12.5 mg twice daily and amlodipine 2.5 mg daily.

## 2020-11-18 NOTE — Patient Instructions (Signed)
A few things to remember from today's visit:   Pulmonary emphysema, unspecified emphysema type ([place]) - Plan: Fluticasone-Salmeterol (WIXELA INHUB) 250-50 MCG/DOSE AEPB  Cough - Plan: benzonatate (TESSALON) 100 MG capsule  If you need refills please call your pharmacy. Do not use My Chart to request refills or for acute issues that need immediate attention.    Please be sure medication list is accurate. If a new problem present, please set up appointment sooner than planned today.  A few tips:  -As we age balance is not as good as it was, so there is a higher risks for falls. Please remove small rugs and furniture that is "in your way" and could increase the risk of falls. Stretching exercises may help with fall prevention: Yoga and Tai Chi are some examples. Low impact exercise is better, so you are not very achy the next day.  -Sun screen and avoidance of direct sun light recommended. Caution with dehydration, if working outdoors be sure to drink enough fluids.  - Some medications are not safe as we age, increases the risk of side effects and can potentially interact with other medication you are also taken;  including some of over the counter medications. Be sure to let me know when you start a new medication even if it is a dietary/vitamin supplement.   -Healthy diet low in red meet/animal fat and sugar + regular physical activity is recommended.

## 2020-11-18 NOTE — Assessment & Plan Note (Signed)
This problem could be contributing to his persistent cough. He agrees with increasing dose of Wixela from 100-50 to 250-50 mcg twice daily. Continue albuterol inhaler 2 puffs every 4-6 hours as needed. Keep next appointment with pulmonologist.

## 2020-11-18 NOTE — Progress Notes (Signed)
HPI:  [name] is a 80 y.o.male here today with his wife for his routine physical examination.  Last CPE: 01/10/16. He lives with his wife, daughter, son-in-law, and granddaughter.  Regular exercise 3 or more times per week: He is doing PT at home, 2 times per week. Stationary bike and walking, last PT appt tomorrow but he has been given a routine exercise plan. Following a healthy diet: Yes, his wife and daughter cook.  Chronic medical problems: Insomnia, hypertension, anxiety, COPD, AAA without rupture, thoracic aneurysm, sick sinus syndrome s/p pacemaker placement, CAD, prostate cancer, and OSA among some.  Prostate cancer Dx'ed in 2017. He follows with oncologist. Currently he is on Eligard 22.5 q 3 months and Zytiga 1000 mg  Daily and Prednisone 5 mg daily since 04/2016.  He also follows with cardio, Dr. Sallyanne Kuster.  Immunization History  Administered Date(s) Administered  . Fluad Quad(high Dose 65+) 05/17/2019, 05/21/2020  . Influenza, High Dose Seasonal PF 05/05/2016, 06/01/2017, 06/22/2018  . PFIZER(Purple Top)SARS-COV-2 Vaccination 10/05/2019, 10/26/2019, 06/18/2020  . Pneumococcal Polysaccharide-23 07/31/2020  . Td 02/13/2019   -Hep C screening: 05/21/20 NR.  -Concerns and/or follow up today:   He is requesting Rx for cough medication. He coughs once in a while. No wheezing and no SOB.  Wixela 100-50 mcg has helped with cough. He uses Albuterol inh 2 puff q 4-6 hours as needed.  GERD on Omeprazole 20 mg daily. He has not noted heartburn.  Hypertension: Currently on metoprolol tartrate 12.5 mg twice daily and amlodipine 2.5 mg daily.  Lab Results  Component Value Date   CREATININE 1.24 10/03/2020   BUN 16 10/03/2020   NA 140 10/03/2020   K 3.9 10/03/2020   CL 103 10/03/2020   CO2 25 10/03/2020   Anxiety: He is on Celexa 10 mg daily and Lorazepam 1 mg bid. Medications still helping, no side effects   +Fatigue. Had sleep study done. BiPaP  was recommended, he is waiting for it. He wakes up a couple times during the night to void, he can go back to sleep. Takes naps during the day, 3-4 times. Following with Dr. Annamaria Boots.  Fall 2-3 weeks ago. His dog did push him down. He needed help with get up. No serious harm, did not seek medical attention.  Hyperlipidemia: He is on atorvastatin 20 mg daily.  Lab Results  Component Value Date   CHOL 139 05/21/2020   HDL 51 05/21/2020   LDLCALC 71 05/21/2020   TRIG 89 05/21/2020   CHOLHDL 2.7 05/21/2020   Review of Systems  Constitutional: Positive for fatigue. Negative for activity change, appetite change and fever.  HENT: Negative for mouth sores, nosebleeds, sore throat and trouble swallowing.   Eyes: Negative for redness and visual disturbance.  Cardiovascular: Positive for leg swelling. Negative for chest pain and palpitations.  Gastrointestinal: Negative for abdominal pain, nausea and vomiting.  Endocrine: Negative for cold intolerance, heat intolerance, polydipsia, polyphagia and polyuria.  Genitourinary: Negative for decreased urine volume, dysuria and hematuria.  Musculoskeletal: Positive for arthralgias and gait problem.  Skin: Negative for rash and wound.  Allergic/Immunologic: Positive for environmental allergies.  Neurological: Negative for syncope, weakness and headaches.  Hematological: Bruises/bleeds easily (with minimal trauma).  Psychiatric/Behavioral: Positive for sleep disturbance. Negative for confusion. The patient is nervous/anxious.    Current Outpatient Medications on File Prior to Visit  Medication Sig Dispense Refill  . abiraterone acetate (ZYTIGA) 250 MG tablet TAKE 4 TABLETS (1,000 MG TOTAL) BY MOUTH DAILY. TAKE  ON AN EMPTY STOMACH 1 HOUR BEFORE OR 2 HOURS AFTER A MEAL 120 tablet 2  . acetaminophen (TYLENOL) 500 MG tablet Take 1,000 mg by mouth every 6 (six) hours as needed for headache (pain).     [name] albuterol (VENTOLIN HFA) 108 (90 Base) MCG/ACT inhaler  TAKE 2 PUFFS BY MOUTH EVERY 6 HOURS AS NEEDED FOR WHEEZE OR SHORTNESS OF BREATH 8.5 each 2  . amLODipine (NORVASC) 2.5 MG tablet TAKE 1 TABLET BY MOUTH EVERY DAY (Patient taking differently: Take 2.5 mg by mouth daily.) 90 tablet 2  . Ascorbic Acid (VITAMIN C PO) Take 1 tablet by mouth daily.    [name] aspirin EC 81 MG tablet Take 81 mg by mouth daily.    [name] atorvastatin (LIPITOR) 20 MG tablet TAKE 1 TABLET BY MOUTH EVERY DAY 90 tablet 2  . carbamide peroxide (DEBROX) 6.5 % OTIC solution Place 5 drops into both ears daily as needed (for earwax).    . citalopram (CELEXA) 10 MG tablet TAKE 1 TABLET BY MOUTH EVERY DAY (Patient taking differently: Take 10 mg by mouth daily.) 90 tablet 3  . diclofenac sodium (VOLTAREN) 1 % GEL Apply 4 g topically 4 (four) times daily. (Patient taking differently: Apply 4 g topically 4 (four) times daily as needed (pain).) 4 Tube 3  . docusate sodium (COLACE) 100 MG capsule Take 100 mg by mouth daily with supper.    . fluticasone (FLONASE) 50 MCG/ACT nasal spray PLACE 1 SPRAY INTO BOTH NOSTRILS DAILY AS NEEDED FOR ALLERGIES OR RHINITIS (AT BEDTIME). (Patient taking differently: Place 1 spray into both nostrils at bedtime as needed for allergies or rhinitis.) 48 mL 1  . furosemide (LASIX) 20 MG tablet TAKE 1 TABLET BY MOUITH DAILY FOR 5 DAYS , THEN DAILY AS NEEDED FOR EDEMA 90 tablet 1  . LORazepam (ATIVAN) 1 MG tablet TAKE 1 TABLET (1 MG TOTAL) BY MOUTH 2 (TWO) TIMES DAILY AS NEEDED. 60 tablet 3  . metoprolol tartrate (LOPRESSOR) 25 MG tablet TAKE 1 TABLET BY MOUTH TWICE A DAY 180 tablet 2  . Multiple Vitamin (MULTIVITAMIN WITH MINERALS) TABS tablet Take 0.5 tablets by mouth every other day. Centrum    . omeprazole (PRILOSEC) 20 MG capsule TAKE 1 CAPSULE (20 MG TOTAL) BY MOUTH DAILY BEFORE BREAKFAST. 90 capsule 3  . predniSONE (DELTASONE) 5 MG tablet TAKE 1 TABLET BY MOUTH EVERY DAY WITH BREAKFAST 90 tablet 0  . Tetrahydrozoline HCl (VISINE OP) Place 1 drop into both eyes daily  as needed (red eyes).     No current facility-administered medications on file prior to visit.   Past Medical History:  Diagnosis Date  . Gastric outlet obstruction 02/03/2018   [name] 02/03/2018  . GERD (gastroesophageal reflux disease)   . History of hiatal hernia    small/notes 02/03/2018  . Hypertension    no meds  . Presence of permanent cardiac pacemaker   . Prostate cancer ([place]) 02/08/14   Gleason 4+5=9, PSA 15.65  . Radiation   . Sinus problem   . SSS (sick sinus syndrome) [facility])     Past Surgical History:  Procedure Laterality Date  . APPENDECTOMY  2019   hx hematoma s/p appendectomy  . ESOPHAGOGASTRODUODENOSCOPY (EGD) WITH PROPOFOL N/A 02/03/2018   Procedure: ESOPHAGOGASTRODUODENOSCOPY (EGD) WITH PROPOFOL;  Surgeon: Clarene Essex, MD;  Location: [facility];  Service: Endoscopy;  Laterality: N/A;  . FRACTURE SURGERY    . INSERT / REPLACE / REMOVE PACEMAKER  06/14/2018  . LAPAROSCOPIC APPENDECTOMY N/A 01/21/2018  Procedure: APPENDECTOMY LAPAROSCOPIC;  Surgeon: Kinsinger, Arta Bruce, MD;  Location: [facility];  Service: General;  Laterality: N/A;  . PACEMAKER IMPLANT N/A 06/14/2018   Procedure: PACEMAKER IMPLANT - Dual Chamber;  Surgeon: Sanda Klein, MD;  Location: [facility];  Service: Cardiovascular;  Laterality: N/A;  . PROSTATE BIOPSY  02/08/14   gleason 4+5=9, 12/12 cores positive, 54 gm  . RADIOACTIVE SEED IMPLANT N/A 06/29/2014   Procedure: RADIOACTIVE SEED IMPLANT;  Surgeon: Bernestine Amass, MD;  Location[facility];  Service: Urology;  Laterality: N/A;  . WRIST FRACTURE SURGERY Right 1980s    Allergies  Allergen Reactions  . Penicillins Rash    Has patient had a PCN reaction causing immediate rash, facial/tongue/throat swelling, SOB or lightheadedness with hypotension: Yes Has patient had a PCN reaction causing severe rash involving mucus membranes or skin necrosis: No Has patient had a PCN reaction that required hospitalization: No Has  patient had a PCN reaction occurring within the last 10 years: No If all of the above answers are "NO", then may proceed with Cephalosporin use.    Family History  Problem Relation Age of Onset  . Alzheimer's disease Mother   . Alzheimer's disease Father   . Cancer Father        prostate  . Arthritis Sister   . Cancer Brother        prostate    Social History   Socioeconomic History  . Marital status: Married    Spouse name: Not on file  . Number of children: 3  . Years of education: Not on file  . Highest education level: Not on file  Occupational History  . 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 Smoking status: Former Smoker    Years: 60.00    Types: Cigars    Quit date: 09/14/2012    Years since quitting: 8.1  . Smokeless tobacco: Never Used  . Tobacco comment: little cigars; the small ones"  Vaping Use  . Vaping Use: Never used  Substance and Sexual Activity  . Alcohol use: Yes    Alcohol/week: 14.0 standard drinks    Types: 14 Cans of beer per week  . Drug use: No  . Sexual activity: Not Currently  Other Topics Concern  . Not on file  Social History Narrative   Lives with wife and 2 dogs in one level home; daughter and her family co-habitate.   Has three daughters, all in [place], supportive. Five grandchildren.   Wants to return to doing silver sneakers once gym re-opens.         Social Determinants of Health   Financial Resource Strain: Medium Risk  . Difficulty of Paying Living Expenses: Somewhat hard  Food Insecurity: No Food Insecurity  . Worried About Charity fundraiser in the Last Year: Never true  . Ran Out of Food in the Last Year: Never true  Transportation Needs: No Transportation Needs  . Lack of Transportation (Medical): No  . Lack of Transportation (Non-Medical): No  Physical Activity: Inactive  . Days of Exercise per Week: 0 days  . Minutes  of Exercise per Session: 0 min  Stress: No Stress Concern Present  . Feeling of Stress : Not at all  Social Connections: Moderately Isolated  . Frequency of Communication with Friends and Family: Once a week  . Frequency of Social Gatherings  with Friends and Family: Never  . Attends Religious Services: 1 to 4 times per year  . Active Member of Clubs or Organizations: No  . Attends Archivist Meetings: Never  . Marital Status: Married   Vitals:   11/18/20 0941  BP: 126/70  Pulse: 89  Resp: 20  Temp: 98.1 F (36.7 C)  SpO2: 92%   Body mass index is 33.2 kg/m.  Wt Readings from Last 3 Encounters:  11/18/20 218 lb 6 oz (99.1 kg)  11/05/20 228 lb (103.4 kg)  10/29/20 222 lb 9.6 oz (101 kg)   Physical Exam Vitals and nursing note reviewed.  Constitutional:      General: He is not in acute distress.    Appearance: He is well-developed.  HENT:     Head: Normocephalic and atraumatic.     Right Ear: Tympanic membrane, ear canal and external ear normal.     Left Ear: Tympanic membrane, ear canal and external ear normal.  Eyes:     Conjunctiva/sclera: Conjunctivae normal.     Pupils: Pupils are equal, round, and reactive to light.  Cardiovascular:     Rate and Rhythm: Normal rate and regular rhythm.     Heart sounds: No murmur heard.     Comments: DP and PT pulses present, bilateral. Pulmonary:     Effort: Pulmonary effort is normal. No respiratory distress.     Breath sounds: Normal breath sounds.  Abdominal:     Palpations: Abdomen is soft. There is no mass.     Tenderness: There is no abdominal tenderness.  Musculoskeletal:     Right lower leg: 1+ Pitting Edema present.     Left lower leg: 1+ Pitting Edema present.  Lymphadenopathy:     Cervical: No cervical adenopathy.  Skin:    General: Skin is warm.     Findings: Ecchymosis (Scattered on upper extremities.) present. No erythema or rash.  Neurological:     General: No focal deficit present.     Mental  Status: He is alert and oriented to person, place, and time.     Cranial Nerves: No cranial nerve deficit.     Comments: Gait assisted by cane.  Psychiatric:        Mood and Affect: Mood is not anxious or depressed.     Comments: Well groomed, good eye contact.   ASSESSMENT AND PLAN:  Mr.Adrin was seen today for follow-up and annual exam.  Diagnoses and all orders for this visit:  Routine general medical examination at a health care facility We discussed the importance of regular physical activity as tolerated and healthful diet for prevention of complications. Preventive guidelines reviewed. Vaccination up to date. Fall precautions discussed. Next CPE in a year.  Cough We discussed possible etiologies. Some of his chronic medical problems can be contributing factors. Improved when Wixela was started. Benzonatate 100-200 mg daily prn. Continue following with pulmonologist.  -     benzonatate (TESSALON) 100 MG capsule; Take 2 capsules (200 mg total) by mouth daily as needed for up to 10 days.  SSS (sick sinus syndrome) ([facility]) S/P pacemaker placement. Following with cardiologist.  Essential hypertension, benign BP adequately controlled. Continue metoprolol tartrate 12.5 mg twice daily and amlodipine 2.5 mg daily.  COPD mixed type ([place]) This problem could be contributing to his persistent cough. He agrees with increasing dose of Wixela from 100-50 to 250-50 mcg twice daily. Continue albuterol inhaler 2 puffs every 4-6 hours as needed. Keep next appointment with pulmonologist.  OSA (obstructive sleep apnea) He is waiting for BiPaP machine.We discussed benefits if he is complaint with wearing it as instructed. Following with Dr Annamaria Boots.  Anxiety disorder Problem is well controlled. Continue Celexa 10 mg daily and lorazepam 1 mg twice daily as needed. We had discussed some side effects of medications.   Return in 6 months (on 05/21/2021) for Fasting labs/HLD,HTN.   Betty  G. [name], MD  [facility]. [place] office.  A few things to remember from today's visit:   Pulmonary emphysema, unspecified emphysema type ([place]) - Plan: Fluticasone-Salmeterol (WIXELA INHUB) 250-50 MCG/DOSE AEPB  Cough - Plan: benzonatate (TESSALON) 100 MG capsule  If you need refills please call your pharmacy. Do not use My Chart to request refills or for acute issues that need immediate attention.    Please be sure medication list is accurate. If a new problem present, please set up appointment sooner than planned today.  A few tips:  -As we age balance is not as good as it was, so there is a higher risks for falls. Please remove small rugs and furniture that is "in your way" and could increase the risk of falls. Stretching exercises may help with fall prevention: Yoga and Tai Chi are some examples. Low impact exercise is better, so you are not very achy the next day.  -Sun screen and avoidance of direct sun light recommended. Caution with dehydration, if working outdoors be sure to drink enough fluids.  - Some medications are not safe as we age, increases the risk of side effects and can potentially interact with other medication you are also taken;  including some of over the counter medications. Be sure to let me know when you start a new medication even if it is a dietary/vitamin supplement.   -Healthy diet low in red meet/animal fat and sugar + regular physical activity is recommended.

## 2020-11-19 DIAGNOSIS — I2510 Atherosclerotic heart disease of native coronary artery without angina pectoris: Secondary | ICD-10-CM | POA: Diagnosis not present

## 2020-11-19 DIAGNOSIS — I714 Abdominal aortic aneurysm, without rupture: Secondary | ICD-10-CM | POA: Diagnosis not present

## 2020-11-19 DIAGNOSIS — Z79899 Other long term (current) drug therapy: Secondary | ICD-10-CM | POA: Diagnosis not present

## 2020-11-19 DIAGNOSIS — M6281 Muscle weakness (generalized): Secondary | ICD-10-CM | POA: Diagnosis not present

## 2020-11-19 DIAGNOSIS — Z87891 Personal history of nicotine dependence: Secondary | ICD-10-CM | POA: Diagnosis not present

## 2020-11-19 DIAGNOSIS — J439 Emphysema, unspecified: Secondary | ICD-10-CM | POA: Diagnosis not present

## 2020-11-19 DIAGNOSIS — I10 Essential (primary) hypertension: Secondary | ICD-10-CM | POA: Diagnosis not present

## 2020-12-06 ENCOUNTER — Other Ambulatory Visit (HOSPITAL_COMMUNITY): Payer: Self-pay

## 2020-12-10 NOTE — Progress Notes (Signed)
Mr Gratz' sleep study  showed severe sleep apnea with low oxygen levels.  We need  to order new DME, new BIPAP 25/21, PS 2, with home oxygen for sleep bleed into BIPAP at 3L/ minute. Ok to use [employer] if that is all that is available.  He will need ROV in 31-90 days.

## 2020-12-11 ENCOUNTER — Other Ambulatory Visit: Payer: Self-pay | Admitting: Oncology

## 2020-12-11 DIAGNOSIS — C61 Malignant neoplasm of prostate: Secondary | ICD-10-CM

## 2020-12-11 NOTE — Addendum Note (Signed)
Addended by: Lia Foyer R on: 12/11/2020 11:35 AM   Modules accepted: Orders

## 2020-12-11 NOTE — Progress Notes (Signed)
Called and spoke with patient and his wife to let him know of sleep study results and that we are ordering Bipap. Informed them that once they physically get the machine to call the office and set up a follow up visit within 31-90 days. They expressed understanding. Nothing further needed at this time.

## 2020-12-17 ENCOUNTER — Ambulatory Visit (INDEPENDENT_AMBULATORY_CARE_PROVIDER_SITE_OTHER): Payer: Medicare Other

## 2020-12-17 ENCOUNTER — Other Ambulatory Visit: Payer: Self-pay | Admitting: *Deleted

## 2020-12-17 ENCOUNTER — Telehealth: Payer: Self-pay | Admitting: *Deleted

## 2020-12-17 DIAGNOSIS — I495 Sick sinus syndrome: Secondary | ICD-10-CM

## 2020-12-17 DIAGNOSIS — C61 Malignant neoplasm of prostate: Secondary | ICD-10-CM

## 2020-12-17 LAB — CUP PACEART REMOTE DEVICE CHECK
Battery Remaining Longevity: 136 mo
Battery Voltage: 3.02 V
Brady Statistic AP VP Percent: 0.06 %
Brady Statistic AP VS Percent: 81.56 %
Brady Statistic AS VP Percent: 0.01 %
Brady Statistic AS VS Percent: 18.37 %
Brady Statistic RA Percent Paced: 81.29 %
Brady Statistic RV Percent Paced: 0.07 %
Date Time Interrogation Session: 20220404235901
Implantable Lead Implant Date: 20191001
Implantable Lead Implant Date: 20191001
Implantable Lead Location: 753859
Implantable Lead Location: 753860
Implantable Lead Model: 5076
Implantable Lead Model: 5076
Implantable Pulse Generator Implant Date: 20191001
Lead Channel Impedance Value: 323 Ohm
Lead Channel Impedance Value: 323 Ohm
Lead Channel Impedance Value: 418 Ohm
Lead Channel Impedance Value: 437 Ohm
Lead Channel Pacing Threshold Amplitude: 0.5 V
Lead Channel Pacing Threshold Amplitude: 0.75 V
Lead Channel Pacing Threshold Pulse Width: 0.4 ms
Lead Channel Pacing Threshold Pulse Width: 0.4 ms
Lead Channel Sensing Intrinsic Amplitude: 2.875 mV
Lead Channel Sensing Intrinsic Amplitude: 2.875 mV
Lead Channel Sensing Intrinsic Amplitude: 3.5 mV
Lead Channel Sensing Intrinsic Amplitude: 3.5 mV
Lead Channel Setting Pacing Amplitude: 1.5 V
Lead Channel Setting Pacing Amplitude: 2.5 V
Lead Channel Setting Pacing Pulse Width: 0.4 ms
Lead Channel Setting Sensing Sensitivity: 0.9 mV

## 2020-12-17 MED ORDER — ABIRATERONE ACETATE 250 MG PO TABS: ORAL_TABLET | Freq: Every day | ORAL | 0 refills | Status: DC

## 2020-12-17 NOTE — Telephone Encounter (Signed)
Faxed signed refill request for Zytiga 250 mg tabs to [facility].  Fax confirmation received.

## 2020-12-18 ENCOUNTER — Other Ambulatory Visit: Payer: Self-pay | Admitting: *Deleted

## 2020-12-18 DIAGNOSIS — I714 Abdominal aortic aneurysm, without rupture, unspecified: Secondary | ICD-10-CM

## 2020-12-18 DIAGNOSIS — I712 Thoracic aortic aneurysm, without rupture, unspecified: Secondary | ICD-10-CM

## 2020-12-19 ENCOUNTER — Other Ambulatory Visit: Payer: Self-pay | Admitting: *Deleted

## 2020-12-19 DIAGNOSIS — I7121 Aneurysm of the ascending aorta, without rupture: Secondary | ICD-10-CM

## 2020-12-19 DIAGNOSIS — I714 Abdominal aortic aneurysm, without rupture, unspecified: Secondary | ICD-10-CM

## 2020-12-19 DIAGNOSIS — I712 Thoracic aortic aneurysm, without rupture: Secondary | ICD-10-CM

## 2020-12-25 ENCOUNTER — Telehealth: Payer: Self-pay | Admitting: Cardiovascular Disease

## 2020-12-25 NOTE — Telephone Encounter (Signed)
°  1. Has your device fired? no ° °2. Is you device beeping? no ° °3. Are you experiencing draining or swelling at device site?no ° °4. Are you calling to see if we received your device transmission?yes ° °5. Have you passed out? no ° ° ° °Please route to Device Clinic Pool °

## 2020-12-25 NOTE — Telephone Encounter (Signed)
Confirmed that transmission received Questions answered.

## 2020-12-27 NOTE — Progress Notes (Signed)
Remote pacemaker transmission.   

## 2020-12-30 ENCOUNTER — Other Ambulatory Visit: Payer: Self-pay

## 2020-12-30 ENCOUNTER — Encounter: Payer: Self-pay | Admitting: Cardiovascular Disease

## 2020-12-30 ENCOUNTER — Ambulatory Visit (INDEPENDENT_AMBULATORY_CARE_PROVIDER_SITE_OTHER): Payer: Medicare Other | Admitting: Cardiovascular Disease

## 2020-12-30 VITALS — BP 100/72 | HR 80 | Ht 68.0 in | Wt 220.0 lb

## 2020-12-30 DIAGNOSIS — I453 Trifascicular block: Secondary | ICD-10-CM | POA: Diagnosis not present

## 2020-12-30 DIAGNOSIS — I2510 Atherosclerotic heart disease of native coronary artery without angina pectoris: Secondary | ICD-10-CM | POA: Diagnosis not present

## 2020-12-30 DIAGNOSIS — I495 Sick sinus syndrome: Secondary | ICD-10-CM

## 2020-12-30 DIAGNOSIS — Z950 Presence of cardiac pacemaker: Secondary | ICD-10-CM | POA: Diagnosis not present

## 2020-12-30 DIAGNOSIS — I714 Abdominal aortic aneurysm, without rupture, unspecified: Secondary | ICD-10-CM

## 2020-12-30 DIAGNOSIS — I7121 Aneurysm of the ascending aorta, without rupture: Secondary | ICD-10-CM

## 2020-12-30 DIAGNOSIS — I712 Thoracic aortic aneurysm, without rupture: Secondary | ICD-10-CM | POA: Diagnosis not present

## 2020-12-30 DIAGNOSIS — I10 Essential (primary) hypertension: Secondary | ICD-10-CM

## 2020-12-30 DIAGNOSIS — E7800 Pure hypercholesterolemia, unspecified: Secondary | ICD-10-CM | POA: Diagnosis not present

## 2020-12-30 DIAGNOSIS — C61 Malignant neoplasm of prostate: Secondary | ICD-10-CM

## 2020-12-30 LAB — PACEMAKER DEVICE OBSERVATION

## 2020-12-30 NOTE — Patient Instructions (Signed)

## 2020-12-30 NOTE — Progress Notes (Signed)
Cardiology OfficeNote    Date:  12/30/2020   ID:  Tony [name], DOB [birth year], MRN [id number]  PCP:  [name], [name], MD  Cardiologist:   Sanda Klein, MD    No chief complaint on file.   History of Present Illness:  Tony [name] is a 80 y.o. 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 He received a dual-chamber permanent pacemaker for sinus bradycardia with chronotropic incompetence in October 2019 [employer] Azure).  Exertional dyspnea and wheezing have improved after his bronchodilator was changed.  The patient specifically denies any chest pain at rest or with exertion, dyspnea at rest, orthopnea, paroxysmal nocturnal dyspnea, syncope, palpitations, focal neurological deficits, intermittent claudication, lower extremity edema, unexplained weight gain, cough, hemoptysis.  He was seen in the emergency room and was subsequently hospitalized for pulmonary edema/acute hypoxemic respiratory failure in the setting of sepsis due to E. coli bacteremia in November 2020.  During that hospitalization he had a CT angiogram of the chest abdomen and pelvis to rule out aortic dissection.  His ascending aortic aneurysm was unchanged in diameter at 4.6 cm and his abdominal aortic aneurysm was also unchanged at 3.8 cm.  He has an appointment to see Dr. Cyndia Bent and to have repeat CT angiography of the aorta on May 22.  He has incidentally discovered coronary artery calcification on chest CT.  In September 2017 his nuclear vasodilator stress test was a low risk study with a small/mild area of ischemia in the apex. EF 59%.   Pacemaker interrogation shows normal device function.  He has roughly 85%  atrial pacing and <0.1% ventricular pacing.  Lead parameters are excellent.  At this point estimated generator longevity is 11.3  years.  He continues to have occasional (every 2 weeks or so) episodes of high ventricular rates, the majority of which appear to be paroxysmal atrial tachycardia with 1: 1 AV conduction and sometimes aberrant conduction.  Some of the episodes might be nonsustained VT, being too brief to characterize.  All of the episodes are brief and asymptomatic.  Heart rate histograms appear appropriate.  Frequent premature atrial contractions.  Presenting rhythm was atrial sensed-atrial paced in a bigeminal pattern due to PACs.  There was normal AV conduction during both A sensed and A paced events.  He is now on second line chemotherapy for prostate cancer refractory to antiandrogen therapy, with excellent response to Zytiga.   Past Medical History:  Diagnosis Date  . Gastric outlet obstruction 02/03/2018   [name] 02/03/2018  . GERD (gastroesophageal reflux disease)   . History of hiatal hernia    small/notes 02/03/2018  . Hypertension    no meds  . Presence of permanent cardiac pacemaker   . Prostate cancer ([place]) 02/08/14   Gleason 4+5=9, PSA 15.65  . Radiation   . Sinus problem   . SSS (sick sinus syndrome) [facility])     Past Surgical History:  Procedure Laterality Date  . APPENDECTOMY  2019   hx hematoma s/p appendectomy  . ESOPHAGOGASTRODUODENOSCOPY (EGD) WITH PROPOFOL N/A 02/03/2018   Procedure: ESOPHAGOGASTRODUODENOSCOPY (EGD) WITH PROPOFOL;  Surgeon: Clarene Essex, MD;  Location: [facility];  Service: Endoscopy;  Laterality: N/A;  . FRACTURE SURGERY    . INSERT / REPLACE / REMOVE PACEMAKER  06/14/2018  . LAPAROSCOPIC APPENDECTOMY N/A 01/21/2018   Procedure: APPENDECTOMY LAPAROSCOPIC;  Surgeon: Kinsinger, Arta Bruce, MD;  Location: [facility];  Service: General;  Laterality: N/A;  . PACEMAKER IMPLANT N/A 06/14/2018   Procedure: PACEMAKER IMPLANT - Dual Chamber;  Surgeon: Sanda Klein, MD;  Location: [facility] [facility];  Service: Cardiovascular;  Laterality: N/A;  . PROSTATE BIOPSY  02/08/14    gleason 4+5=9, 12/12 cores positive, 54 gm  . RADIOACTIVE SEED IMPLANT N/A 06/29/2014   Procedure: RADIOACTIVE SEED IMPLANT;  Surgeon: Bernestine Amass, MD;  Location: [facility];  Service: Urology;  Laterality: N/A;  . WRIST FRACTURE SURGERY Right 1980s    Current Medications: Outpatient Medications Prior to Visit  Medication Sig Dispense Refill  . abiraterone acetate (ZYTIGA) 250 MG tablet TAKE 4 TABLETS (1,000 MG TOTAL) BY MOUTH DAILY. TAKE ON AN EMPTY STOMACH 1 HOUR BEFORE OR 2 HOURS AFTER A MEAL 120 tablet 0  . acetaminophen (TYLENOL) 500 MG tablet Take 1,000 mg by mouth every 6 (six) hours as needed for headache (pain).     [name] albuterol (VENTOLIN HFA) 108 (90 Base) MCG/ACT inhaler TAKE 2 PUFFS BY MOUTH EVERY 6 HOURS AS NEEDED FOR WHEEZE OR SHORTNESS OF BREATH 8.5 each 2  . amLODipine (NORVASC) 2.5 MG tablet TAKE 1 TABLET BY MOUTH EVERY DAY (Patient taking differently: Take 2.5 mg by mouth daily.) 90 tablet 2  . Ascorbic Acid (VITAMIN C PO) Take 1 tablet by mouth daily.    [name] aspirin EC 81 MG tablet Take 81 mg by mouth daily.    [name] atorvastatin (LIPITOR) 20 MG tablet TAKE 1 TABLET BY MOUTH EVERY DAY 90 tablet 2  . carbamide peroxide (DEBROX) 6.5 % OTIC solution Place 5 drops into both ears daily as needed (for earwax).    . citalopram (CELEXA) 10 MG tablet TAKE 1 TABLET BY MOUTH EVERY DAY (Patient taking differently: Take 10 mg by mouth daily.) 90 tablet 3  . diclofenac sodium (VOLTAREN) 1 % GEL Apply 4 g topically 4 (four) times daily. (Patient taking differently: Apply 4 g topically 4 (four) times daily as needed (pain).) 4 Tube 3  . docusate sodium (COLACE) 100 MG capsule Take 100 mg by mouth daily with supper.    . fluticasone (FLONASE) 50 MCG/ACT nasal spray PLACE 1 SPRAY INTO BOTH NOSTRILS DAILY AS NEEDED FOR ALLERGIES OR RHINITIS (AT BEDTIME). (Patient taking differently: Place 1 spray into both nostrils at bedtime as needed for allergies or rhinitis.) 48 mL 1  .  Fluticasone-Salmeterol (WIXELA INHUB) 250-50 MCG/DOSE AEPB Inhale 1 puff into the lungs in the morning and at bedtime. 60 each 2  . furosemide (LASIX) 20 MG tablet TAKE 1 TABLET BY MOUITH DAILY FOR 5 DAYS , THEN DAILY AS NEEDED FOR EDEMA 90 tablet 1  . LORazepam (ATIVAN) 1 MG tablet TAKE 1 TABLET (1 MG TOTAL) BY MOUTH 2 (TWO) TIMES DAILY AS NEEDED. 60 tablet 3  . metoprolol tartrate (LOPRESSOR) 25 MG tablet TAKE 1 TABLET BY MOUTH TWICE A DAY 180 tablet 2  . Multiple Vitamin (MULTIVITAMIN WITH MINERALS) TABS tablet Take 0.5 tablets by mouth every other day. Centrum    . omeprazole (PRILOSEC) 20 MG capsule TAKE 1 CAPSULE (20 MG TOTAL) BY MOUTH DAILY BEFORE BREAKFAST. 90 capsule 3  . predniSONE (DELTASONE) 5 MG tablet TAKE 1 TABLET BY MOUTH EVERY DAY WITH BREAKFAST 90 tablet 0  . Tetrahydrozoline HCl (VISINE OP) Place 1 drop into both eyes daily as needed (red eyes).     No facility-administered medications prior to visit.     Allergies:   Penicillins   Social History  Socioeconomic History  . Marital status: Married    Spouse name: Not on file  . Number of children: 3  . Years of education: Not on file  . Highest education level: Not on file  Occupational History  . 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 Smoking status: Former Smoker    Years: 60.00    Types: Cigars    Quit date: 09/14/2012    Years since quitting: 8.2  . Smokeless tobacco: Never Used  . Tobacco comment: little cigars; the small ones"  Vaping Use  . Vaping Use: Never used  Substance and Sexual Activity  . Alcohol use: Yes    Alcohol/week: 14.0 standard drinks    Types: 14 Cans of beer per week  . Drug use: No  . Sexual activity: Not Currently  Other Topics Concern  . Not on file  Social History Narrative   Lives with wife and 2 dogs in one level home; daughter and her family co-habitate.   Has three  daughters, all in [place], supportive. Five grandchildren.   Wants to return to doing silver sneakers once gym re-opens.         Social Determinants of Health   Financial Resource Strain: Medium Risk  . Difficulty of Paying Living Expenses: Somewhat hard  Food Insecurity: No Food Insecurity  . Worried About Charity fundraiser in the Last Year: Never true  . Ran Out of Food in the Last Year: Never true  Transportation Needs: No Transportation Needs  . Lack of Transportation (Medical): No  . Lack of Transportation (Non-Medical): No  Physical Activity: Inactive  . Days of Exercise per Week: 0 days  . Minutes of Exercise per Session: 0 min  Stress: No Stress Concern Present  . Feeling of Stress : Not at all  Social Connections: Moderately Isolated  . Frequency of Communication with Friends and Family: Once a week  . Frequency of Social Gatherings with Friends and Family: Never  . Attends Religious Services: 1 to 4 times per year  . Active Member of Clubs or Organizations: No  . Attends Archivist Meetings: Never  . Marital Status: Married     Family History:  The patient's family history includes Alzheimer's disease in his father and mother; Arthritis in his sister; Cancer in his brother and father.   ROS:   Please see the history of present illness.    ROS All other systems are reviewed and are negative.  PHYSICAL EXAM:   VS:  BP 100/72   Pulse 80   Ht 5\' 8"  (1.727 m)   Wt 220 lb (99.8 kg)   SpO2 94%   BMI 33.45 kg/m      General: Alert, oriented x3, no distress, obese.  Healthy left subclavian pacemaker site Head: no evidence of trauma, PERRL, EOMI, no exophtalmos or lid lag, no myxedema, no xanthelasma; normal ears, nose and oropharynx Neck: normal jugular venous pulsations and no hepatojugular reflux; brisk carotid pulses without delay and no carotid bruits Chest: clear to auscultation, no signs of consolidation by percussion or palpation, normal fremitus,  symmetrical and full respiratory excursions Cardiovascular: normal position and quality of the apical impulse, regular bigeminal rhythm, normal first and second heart sounds, no murmurs, rubs or gallops Abdomen: no tenderness or distention, no masses by palpation, no abnormal pulsatility or arterial bruits, normal bowel sounds, no hepatosplenomegaly  Extremities: no clubbing, cyanosis or edema; 2+ radial, ulnar and brachial pulses bilaterally; 2+ right femoral, posterior tibial and dorsalis pedis pulses; 2+ left femoral, posterior tibial and dorsalis pedis pulses; no subclavian or femoral bruits Neurological: grossly nonfocal Psych: Normal mood and affect  Wt Readings from Last 3 Encounters:  12/30/20 220 lb (99.8 kg)  11/18/20 218 lb 6 oz (99.1 kg)  11/05/20 228 lb (103.4 kg)      Studies/Labs Reviewed:   ECHO 07/19/2020:  1. Left ventricular ejection fraction, by estimation, is 65 to 70%. The  left ventricle has normal function. The left ventricle has no regional  wall motion abnormalities. Left ventricular diastolic function could not  be evaluated.  2. Right ventricular systolic function is normal. The right ventricular  size is normal. There is normal pulmonary artery systolic pressure.  3. The mitral valve is grossly normal. No evidence of mitral valve  regurgitation.  4. The aortic valve is abnormal. Aortic valve regurgitation is not  visualized. Mild to moderate aortic valve sclerosis/calcification is  present, without any evidence of aortic stenosis.  5. Aortic dilatation noted. There is mild dilatation of the aortic root,  measuring 42 mm. There is mild dilatation of the ascending aorta,  measuring 43 mm.  6. Agitated saline contrast bubble study was negative, with no evidence  of any interatrial shunt.   EKG:  EKG is not  ordered today.  Rhythm shows alternating atrial sensed and atrial paced beats, all with a native AV conduction on the cardiac electrogram.  Most  recent ECG tracing from 07/18/2020 shows probable ectopic atrial rhythm at 94 bpm with right bundle branch block.  Recent Labs: 07/18/2020: B Natriuretic Peptide 130.6 07/21/2020: Magnesium 2.3 10/03/2020: ALT 20; BUN 16; Creatinine 1.24; Hemoglobin 15.1; Platelet Count 172; Potassium 3.9; Sodium 140   Lipid Panel    Component Value Date/Time   CHOL 139 05/21/2020 1007   TRIG 89 05/21/2020 1007   HDL 51 05/21/2020 1007   CHOLHDL 2.7 05/21/2020 1007   VLDL 16 06/15/2018 0222   LDLCALC 71 05/21/2020 1007   ASSESSMENT:    1. SSS (sick sinus syndrome) ([place])   2. Pacemaker   3. Trifascicular block   4. Coronary artery disease involving native coronary artery of native heart without angina pectoris   5. Ascending aortic aneurysm ([place])   6. 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[place])   7. Hypercholesterolemia   8. Essential hypertension, benign   9. Malignant neoplasm of prostate ([place])      PLAN:  In order of problems listed above:  1. Sinus bradycardia/SSS: No symptoms of bradycardia or chronotropic incompetence with current device settings. 2. PPM: Normal function, continue remote downloads every 3 months 3. RBBB+LAFB+long PR: Despite extensive intraventricular conduction abnormalities, he requires very little V pacing even when prescribed beta-blockers. 4. CAD: Asymptomatic. Low risk stress test 2017 but with a small area of ischemia.   He takes metoprolol and amlodipine which may be functioning as antianginal medications in addition to antihypertensive effects.  Invasive evaluation does not appear to be indicated.   5. Ascending aortic aneurysm: Had an inadvertent 65-month recheck in November 2021, show no change in diameter from May 2021 (4.6 cm).  He has an appointment with Dr. Cyndia Bent in May, will alert him of the CT performed in the emergency room to see if he thinks he truly needs semiannual follow-up or we can put off his repeat CT for another 6 months. 6. AAA:  Similarly, inadvertent recheck  at his ED visit shows stable diameter 3.8 cm. 7. HLP: Recent LDL 71, very close to target.  Continue statin 8. HTN: Blood pressure is actually relatively low but he does not have any symptoms of hypotension.  Continue amlodipine and metoprolol for their advantages as antianginal medications. 9. Prostate Ca: good response to treatment with Zytiga.  PSA undetectable since 2017.  Has a follow-up with Dr. [name] later this week.   Medication Adjustments/Labs and Tests Ordered: Current medicines are reviewed at length with the patient today.  Concerns regarding medicines are outlined above.  Medication changes, Labs and Tests ordered today are listed in the Patient Instructions below.  Patient Instructions  Medication Instructions:  No changes *If you need a refill on your cardiac medications before your next appointment, please call your pharmacy*   Lab Work: None ordered If you have labs (blood work) drawn today and your tests are completely normal, you will receive your results only by: [name] MyChart Message (if you have MyChart) OR . A paper copy in the mail If you have any lab test that is abnormal or we need to change your treatment, we will call you to review the results.   Testing/Procedures: None ordered   Follow-Up: At [facility], you and your health needs are our priority.  As part of our continuing mission to provide you with exceptional heart care, we have created designated Provider Care Teams.  These Care Teams include your primary Cardiologist (physician) and Advanced Practice Providers (APPs -  Physician Assistants and Nurse Practitioners) who all work together to provide you with the care you need, when you need it.  We recommend signing up for the patient portal called "MyChart".  Sign up information is provided on this After Visit Summary.  MyChart is used to connect with patients for Virtual Visits (Telemedicine).  Patients are able to view  lab/test results, encounter notes, upcoming appointments, etc.  Non-urgent messages can be sent to your provider as well.   To learn more about what you can do with MyChart, go to [email or web address].    Your next appointment:   12 month(s)  The format for your next appointment:   In Person  Provider:   Sanda Klein, MD      Signed, Sanda Klein, MD  12/30/2020 4:15 PM    [facility] [address], [place], [place]  [address] Phone: [phone]; Fax: [phone]

## 2021-01-01 ENCOUNTER — Inpatient Hospital Stay: Payer: Medicare Other | Admitting: Oncology

## 2021-01-01 ENCOUNTER — Other Ambulatory Visit: Payer: Self-pay

## 2021-01-01 ENCOUNTER — Inpatient Hospital Stay: Payer: Medicare Other | Attending: Oncology

## 2021-01-01 ENCOUNTER — Inpatient Hospital Stay: Payer: Medicare Other

## 2021-01-01 VITALS — BP 120/92 | HR 82 | Temp 98.0°F | Resp 18 | Ht 68.0 in | Wt 218.3 lb

## 2021-01-01 DIAGNOSIS — C61 Malignant neoplasm of prostate: Secondary | ICD-10-CM | POA: Diagnosis present

## 2021-01-01 DIAGNOSIS — Z5111 Encounter for antineoplastic chemotherapy: Secondary | ICD-10-CM | POA: Diagnosis not present

## 2021-01-01 LAB — CMP (CANCER CENTER ONLY)
ALT: 14 U/L (ref 0–44)
AST: 18 U/L (ref 15–41)
Albumin: 4 g/dL (ref 3.5–5.0)
Alkaline Phosphatase: 88 U/L (ref 38–126)
Anion gap: 11 (ref 5–15)
BUN: 16 mg/dL (ref 8–23)
CO2: 26 mmol/L (ref 22–32)
Calcium: 9.3 mg/dL (ref 8.9–10.3)
Chloride: 103 mmol/L (ref 98–111)
Creatinine: 1.1 mg/dL (ref 0.61–1.24)
GFR, Estimated: >60 mL/min (ref >60)
Glucose, Bld: 100 mg/dL — ABNORMAL HIGH (ref 70–99)
Potassium: 4.7 mmol/L (ref 3.5–5.1)
Sodium: 140 mmol/L (ref 135–145)
Total Bilirubin: 0.7 mg/dL (ref 0.3–1.2)
Total Protein: 7 g/dL (ref 6.5–8.1)

## 2021-01-01 LAB — CBC WITH DIFFERENTIAL (CANCER CENTER ONLY)
Abs Immature Granulocytes: 0.05 10*3/uL (ref 0.00–0.07)
Basophils Absolute: 0.1 10*3/uL (ref 0.0–0.1)
Basophils Relative: 1 %
Eosinophils Absolute: 0.1 10*3/uL (ref 0.0–0.5)
Eosinophils Relative: 1 %
HCT: 44.1 % (ref 39.0–52.0)
Hemoglobin: 14.4 g/dL (ref 13.0–17.0)
Immature Granulocytes: 1 %
Lymphocytes Relative: 11 %
Lymphs Abs: 1 10*3/uL (ref 0.7–4.0)
MCH: 30.5 pg (ref 26.0–34.0)
MCHC: 32.7 g/dL (ref 30.0–36.0)
MCV: 93.4 fL (ref 80.0–100.0)
Monocytes Absolute: 0.6 10*3/uL (ref 0.1–1.0)
Monocytes Relative: 7 %
Neutro Abs: 7.1 10*3/uL (ref 1.7–7.7)
Neutrophils Relative %: 79 %
Platelet Count: 180 10*3/uL (ref 150–400)
RBC: 4.72 MIL/uL (ref 4.22–5.81)
RDW: 13.5 % (ref 11.5–15.5)
WBC Count: 8.8 10*3/uL (ref 4.0–10.5)
nRBC: 0 % (ref 0.0–0.2)

## 2021-01-01 MED ORDER — LEUPROLIDE ACETATE (3 MONTH) 22.5 MG ~~LOC~~ KIT
22.5000 mg | PACK | Freq: Once | SUBCUTANEOUS | Status: AC
  Administered 2021-01-01: 22.5 mg via SUBCUTANEOUS

## 2021-01-01 MED ORDER — LEUPROLIDE ACETATE (3 MONTH) 22.5 MG ~~LOC~~ KIT
PACK | SUBCUTANEOUS | Status: AC
  Filled 2021-01-01: qty 22.5

## 2021-01-01 NOTE — Progress Notes (Signed)
Hematology and Oncology Follow Up Visit  Tony [name] [id number] 26-Jan-1941 80 y.o. 01/01/2021 9:14 AM [name], [name], [name], [name], MD   Principle Diagnosis: 80 year old man with advanced prostate cancer with lymphadenopathy diagnosed in 2017.  He has castration-resistant advanced prostate after presenting in 2015 with Gleason score 4+5 = 9 and a PSA 15.7.   Prior Therapy:  He is status post radiation therapy for external beam radiation completed in September 2015 for a total of 45 gray. He also status post seed implant brachytherapy boost completed in October 2015.   He received total of 1 year of androgen deprivation 22,015 and 2016. His PSA nadir was to 0.47 in November 2016. His PSA was 4.08 in May 2017.  Current therapy:  Eligard 22.5 mg every 3 months.  He will receive that today and repeated in another 3 months.  Zytiga 1000 mg with prednisone started in August 2017.   Interim History: Tony [name] returns today for a follow-up visit.  Since the last visit, he reports no major changes in his health.  He has tolerated Zytiga without any major concerns or complications.  Denies any nausea, vomiting or worsening bone pain.  He denies any weight gain or appetite changes.         Medications: Updated on review. Current Outpatient Medications  Medication Sig Dispense Refill  . abiraterone acetate (ZYTIGA) 250 MG tablet TAKE 4 TABLETS (1,000 MG TOTAL) BY MOUTH DAILY. TAKE ON AN EMPTY STOMACH 1 HOUR BEFORE OR 2 HOURS AFTER A MEAL 120 tablet 0  . acetaminophen (TYLENOL) 500 MG tablet Take 1,000 mg by mouth every 6 (six) hours as needed for headache (pain).     [name] albuterol (VENTOLIN HFA) 108 (90 Base) MCG/ACT inhaler TAKE 2 PUFFS BY MOUTH EVERY 6 HOURS AS NEEDED FOR WHEEZE OR SHORTNESS OF BREATH 8.5 each 2  . amLODipine (NORVASC) 2.5 MG tablet TAKE 1 TABLET BY MOUTH EVERY DAY (Patient taking differently: Take 2.5 mg by mouth daily.) 90 tablet 2  . Ascorbic Acid (VITAMIN  C PO) Take 1 tablet by mouth daily.    [name] aspirin EC 81 MG tablet Take 81 mg by mouth daily.    [name] atorvastatin (LIPITOR) 20 MG tablet TAKE 1 TABLET BY MOUTH EVERY DAY 90 tablet 2  . carbamide peroxide (DEBROX) 6.5 % OTIC solution Place 5 drops into both ears daily as needed (for earwax).    . citalopram (CELEXA) 10 MG tablet TAKE 1 TABLET BY MOUTH EVERY DAY (Patient taking differently: Take 10 mg by mouth daily.) 90 tablet 3  . diclofenac sodium (VOLTAREN) 1 % GEL Apply 4 g topically 4 (four) times daily. (Patient taking differently: Apply 4 g topically 4 (four) times daily as needed (pain).) 4 Tube 3  . docusate sodium (COLACE) 100 MG capsule Take 100 mg by mouth daily with supper.    . fluticasone (FLONASE) 50 MCG/ACT nasal spray PLACE 1 SPRAY INTO BOTH NOSTRILS DAILY AS NEEDED FOR ALLERGIES OR RHINITIS (AT BEDTIME). (Patient taking differently: Place 1 spray into both nostrils at bedtime as needed for allergies or rhinitis.) 48 mL 1  . Fluticasone-Salmeterol (WIXELA INHUB) 250-50 MCG/DOSE AEPB Inhale 1 puff into the lungs in the morning and at bedtime. 60 each 2  . furosemide (LASIX) 20 MG tablet TAKE 1 TABLET BY MOUITH DAILY FOR 5 DAYS , THEN DAILY AS NEEDED FOR EDEMA 90 tablet 1  . LORazepam (ATIVAN) 1 MG tablet TAKE 1 TABLET (1 MG TOTAL) BY MOUTH 2 (  TWO) TIMES DAILY AS NEEDED. 60 tablet 3  . metoprolol tartrate (LOPRESSOR) 25 MG tablet TAKE 1 TABLET BY MOUTH TWICE A DAY 180 tablet 2  . Multiple Vitamin (MULTIVITAMIN WITH MINERALS) TABS tablet Take 0.5 tablets by mouth every other day. Centrum    . omeprazole (PRILOSEC) 20 MG capsule TAKE 1 CAPSULE (20 MG TOTAL) BY MOUTH DAILY BEFORE BREAKFAST. 90 capsule 3  . predniSONE (DELTASONE) 5 MG tablet TAKE 1 TABLET BY MOUTH EVERY DAY WITH BREAKFAST 90 tablet 0  . Tetrahydrozoline HCl (VISINE OP) Place 1 drop into both eyes daily as needed (red eyes).     No current facility-administered medications for this visit.     Allergies:  Allergies   Allergen Reactions  . Penicillins Rash    Has patient had a PCN reaction causing immediate rash, facial/tongue/throat swelling, SOB or lightheadedness with hypotension: Yes Has patient had a PCN reaction causing severe rash involving mucus membranes or skin necrosis: No Has patient had a PCN reaction that required hospitalization: No Has patient had a PCN reaction occurring within the last 10 years: No If all of the above answers are "NO", then may proceed with Cephalosporin use.      Physical Exam:      Blood pressure (!) 120/92, pulse 82, temperature 98 F (36.7 C), temperature source Tympanic, resp. rate 18, height 5\' 8"  (1.727 m), weight 218 lb 4.8 oz (99 kg), SpO2 93 %.      ECOG: 1    General appearance: Comfortable appearing without any discomfort Head: Normocephalic without any trauma Oropharynx: Mucous membranes are moist and pink without any thrush or ulcers. Eyes: Pupils are equal and round reactive to light. Lymph nodes: No cervical, supraclavicular, inguinal or axillary lymphadenopathy.   Heart:regular rate and rhythm.  S1 and S2 without leg edema. Lung: Clear without any rhonchi or wheezes.  No dullness to percussion. Abdomin: Soft, nontender, nondistended with good bowel sounds.  No hepatosplenomegaly. Musculoskeletal: No joint deformity or effusion.  Full range of motion noted. Neurological: No deficits noted on motor, sensory and deep tendon reflex exam. Skin: No petechial rash or dryness.  Appeared moist.               Lab Results: Lab Results  Component Value Date   WBC 6.0 10/03/2020   HGB 15.1 10/03/2020   HCT 46.0 10/03/2020   MCV 94.5 10/03/2020   PLT 172 10/03/2020     Chemistry      Component Value Date/Time   NA 140 10/03/2020 1232   NA 139 02/03/2019 0857   NA 141 08/27/2017 0915   K 3.9 10/03/2020 1232   K 4.8 08/27/2017 0915   CL 103 10/03/2020 1232   CO2 25 10/03/2020 1232   CO2 26 08/27/2017 0915   BUN 16  10/03/2020 1232   BUN 13 02/03/2019 0857   BUN 16.2 08/27/2017 0915   CREATININE 1.24 10/03/2020 1232   CREATININE 1.07 07/31/2020 1325   CREATININE 0.9 08/27/2017 0915   GLU 105 01/10/2016 0000      Component Value Date/Time   CALCIUM 9.3 10/03/2020 1232   CALCIUM 9.8 08/27/2017 0915   ALKPHOS 84 10/03/2020 1232   ALKPHOS 83 08/27/2017 0915   AST 32 10/03/2020 1232   AST 23 08/27/2017 0915   ALT 20 10/03/2020 1232   ALT 17 08/27/2017 0915   BILITOT 0.7 10/03/2020 1232   BILITOT 0.95 08/27/2017 0915          Results for Jeansonne, Tony [name]  Naida Sleight (MRN [id number]) as of 01/01/2021 09:16  Ref. Range 04/03/2020 07:25 07/03/2020 13:02 10/03/2020 12:32  Prostate Specific Ag, Serum Latest Ref Range: 0.0 - 4.0 ng/mL <0.1 <0.1 <0.1      Impression and Plan:   80 year old man with:  1.  Advanced prostate cancer with lymphadenopathy diagnosed in 2017.  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  He continues to be on Zytiga without any major complications.  Risks and benefits of continuing this treatment long-term were discussed.  Complication occluding hypertension, edema, hypokalemia among others.  His PSA continues to be undetectable with overall excellent response.  I recommended continued same dose and schedule and use different salvage therapy options upon progression.  These options include systemic chemotherapy, Trudi Ida among others.  He is agreeable to continue at this time.   3. Hypokalemia: His potassium continues to be within normal range and will monitor on Zytiga.  4. Hypertension: His blood pressure is normal at this time and will continue to monitor.  5.  Prognosis and goals of care: His disease is incurable although aggressive measures are warranted given his reasonable performance status.  6.  Androgen deprivation therapy: Currently on Eligard and which will be continued indefinitely.  Complications associated with this treatment include weight gain,  osteoporosis and hot flashes among others.   7. Follow-up: He will return in 3 months for a follow-up visit.  30  minutes were dedicated to this encounter.  The time was spent on reviewing laboratory data updating his disease status, discussing treatment options and future plan of care review.  Zola Button, MD 4/20/20229:14 AM

## 2021-01-01 NOTE — Patient Instructions (Signed)

## 2021-01-02 ENCOUNTER — Telehealth: Payer: Self-pay | Admitting: *Deleted

## 2021-01-02 LAB — PROSTATE-SPECIFIC AG, SERUM (LABCORP): Prostate Specific Ag, Serum: <0.1 ng/mL (ref 0.0–4.0)

## 2021-01-02 NOTE — Telephone Encounter (Signed)
-----   Message from Wyatt Portela, MD sent at 01/02/2021  8:42 AM EDT ----- Please let him know his PSA is still low

## 2021-01-02 NOTE — Telephone Encounter (Signed)
PC to patient, spoke with his wife, informed her his PSA is <0.1   She verbalizes understanding.

## 2021-01-09 ENCOUNTER — Other Ambulatory Visit: Payer: Self-pay | Admitting: *Deleted

## 2021-01-09 DIAGNOSIS — C61 Malignant neoplasm of prostate: Secondary | ICD-10-CM

## 2021-01-09 MED ORDER — ABIRATERONE ACETATE 250 MG PO TABS: ORAL_TABLET | Freq: Every day | ORAL | 0 refills | Status: DC

## 2021-01-14 ENCOUNTER — Telehealth: Payer: Self-pay | Admitting: *Deleted

## 2021-01-14 DIAGNOSIS — C61 Malignant neoplasm of prostate: Secondary | ICD-10-CM

## 2021-01-14 MED ORDER — ABIRATERONE ACETATE 250 MG PO TABS: ORAL_TABLET | Freq: Every day | ORAL | 3 refills | Status: DC

## 2021-01-14 NOTE — Telephone Encounter (Signed)
Faxed prescription signed by Dr. [name] for refill of Zytiga to [facility]. Fax confirmation received.

## 2021-01-16 DIAGNOSIS — S30861A Insect bite (nonvenomous) of abdominal wall, initial encounter: Secondary | ICD-10-CM | POA: Diagnosis not present

## 2021-01-16 DIAGNOSIS — W57XXXA Bitten or stung by nonvenomous insect and other nonvenomous arthropods, initial encounter: Secondary | ICD-10-CM | POA: Diagnosis not present

## 2021-01-27 ENCOUNTER — Other Ambulatory Visit: Payer: Self-pay | Admitting: Family Medicine

## 2021-01-27 DIAGNOSIS — F418 Other specified anxiety disorders: Secondary | ICD-10-CM

## 2021-01-27 DIAGNOSIS — R600 Localized edema: Secondary | ICD-10-CM

## 2021-01-27 DIAGNOSIS — I10 Essential (primary) hypertension: Secondary | ICD-10-CM

## 2021-01-28 ENCOUNTER — Telehealth: Payer: Self-pay | Admitting: Internal Medicine

## 2021-01-28 NOTE — Telephone Encounter (Signed)
Order did not hit our [place] - it was assigned to the incorrect department.  Sending to [facility].  Pt aware.  Nothing further needed at this time.

## 2021-01-29 ENCOUNTER — Ambulatory Visit: Payer: Medicare Other | Admitting: Surgery

## 2021-01-29 ENCOUNTER — Ambulatory Visit
Admission: RE | Admit: 2021-01-29 | Discharge: 2021-01-29 | Disposition: A | Discharge status: Home / Self Care | Payer: Medicare Other | Source: Ambulatory Visit | Attending: Surgery | Admitting: Surgery

## 2021-01-29 ENCOUNTER — Encounter: Payer: Self-pay | Admitting: Surgery

## 2021-01-29 ENCOUNTER — Other Ambulatory Visit: Payer: Self-pay

## 2021-01-29 VITALS — BP 100/75 | HR 86 | Resp 20 | Ht 68.0 in | Wt 219.0 lb

## 2021-01-29 DIAGNOSIS — I714 Abdominal aortic aneurysm, without rupture, unspecified: Secondary | ICD-10-CM

## 2021-01-29 DIAGNOSIS — I513 Intracardiac thrombosis, not elsewhere classified: Secondary | ICD-10-CM | POA: Diagnosis not present

## 2021-01-29 DIAGNOSIS — I712 Thoracic aortic aneurysm, without rupture, unspecified: Secondary | ICD-10-CM

## 2021-01-29 DIAGNOSIS — I774 Celiac artery compression syndrome: Secondary | ICD-10-CM | POA: Diagnosis not present

## 2021-01-29 DIAGNOSIS — I7121 Aneurysm of the ascending aorta, without rupture: Secondary | ICD-10-CM

## 2021-01-29 MED ORDER — IOPAMIDOL (ISOVUE-370) INJECTION 76%
75.0000 mL | Freq: Once | INTRAVENOUS | Status: AC | PRN
  Administered 2021-01-29: 75 mL via INTRAVENOUS

## 2021-01-29 NOTE — Progress Notes (Signed)
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  Since I last saw him on 01/24/2020 he has been feeling well.  He is followed by Dr. [name] for advanced prostate cancer with lymphadenopathy diagnosed in 2017.  He is currently on Zytiga and Eligard.  He has done relatively well with treatment and his PSA remains undetectable.  Current Outpatient Medications  Medication Sig Dispense Refill  . abiraterone acetate (ZYTIGA) 250 MG tablet TAKE 4 TABLETS (1,000 MG TOTAL) BY MOUTH DAILY. TAKE ON AN EMPTY STOMACH 1 HOUR BEFORE OR 2 HOURS AFTER A MEAL 120 tablet 3  . acetaminophen (TYLENOL) 500 MG tablet Take 1,000 mg by mouth every 6 (six) hours as needed for headache (pain).     [name] albuterol (VENTOLIN HFA) 108 (90 Base) MCG/ACT inhaler TAKE 2 PUFFS BY MOUTH EVERY 6 HOURS AS NEEDED FOR WHEEZE OR SHORTNESS OF BREATH 8.5 each 2  . amLODipine (NORVASC) 2.5 MG tablet TAKE 1 TABLET BY MOUTH EVERY DAY 90 tablet 2  . Ascorbic Acid (VITAMIN C PO) Take 1 tablet by mouth daily.    [name] aspirin EC 81 MG tablet Take 81 mg by mouth daily.    [name] atorvastatin (LIPITOR) 20 MG tablet TAKE 1 TABLET BY MOUTH EVERY DAY 90 tablet 2  . carbamide peroxide (DEBROX) 6.5 % OTIC solution Place 5 drops into both ears daily as needed (for earwax).    . citalopram (CELEXA) 10 MG tablet TAKE 1 TABLET BY MOUTH EVERY DAY 90 tablet 3  . diclofenac sodium (VOLTAREN) 1 % GEL Apply 4 g topically 4 (four) times daily. (Patient taking differently: Apply 4 g topically 4 (four) times daily as needed (pain).) 4 Tube 3  . docusate sodium (COLACE) 100 MG capsule Take 100 mg by mouth daily with supper.    . fluticasone (FLONASE) 50 MCG/ACT nasal spray PLACE 1 SPRAY INTO BOTH NOSTRILS DAILY AS NEEDED FOR  ALLERGIES OR RHINITIS (AT BEDTIME). (Patient taking differently: Place 1 spray into both nostrils at bedtime as needed for allergies or rhinitis.) 48 mL 1  . Fluticasone-Salmeterol (WIXELA INHUB) 250-50 MCG/DOSE AEPB Inhale 1 puff into the lungs in the morning and at bedtime. 60 each 2  . furosemide (LASIX) 20 MG tablet TAKE 1 TABLET BY MOUITH DAILY FOR 5 DAYS , THEN DAILY AS NEEDED FOR EDEMA 90 tablet 1  . LORazepam (ATIVAN) 1 MG tablet TAKE 1 TABLET (1 MG TOTAL) BY MOUTH 2 (TWO) TIMES DAILY AS NEEDED. 60 tablet 3  . metoprolol tartrate (LOPRESSOR) 25 MG tablet TAKE 1 TABLET BY MOUTH TWICE A DAY 180 tablet 2  . Multiple Vitamin (MULTIVITAMIN WITH MINERALS) TABS tablet Take 0.5 tablets by mouth every other day. Centrum    . omeprazole (PRILOSEC) 20 MG capsule TAKE 1 CAPSULE (20 MG TOTAL) BY MOUTH DAILY BEFORE BREAKFAST. 90 capsule 3  . predniSONE (DELTASONE) 5 MG tablet TAKE 1 TABLET BY MOUTH EVERY DAY WITH BREAKFAST 90 tablet 0  . Tetrahydrozoline HCl (VISINE OP) Place 1 drop into both eyes daily as needed (red eyes).     No current facility-administered medications for this visit.     Physical Exam: BP 100/75 (BP Location: Right Arm, Patient Position: Sitting)   Pulse 86   Resp  20   Ht 5\' 8"  (1.727 m)   Wt 219 lb (99.3 kg)   SpO2 91% Comment: RA  BMI 33.30 kg/m  He has an elderly gentleman in no distress. Cardiac exam shows regular rate and rhythm with normal heart sounds.  There is no murmur. Lungs are clear. There is no peripheral edema.  Diagnostic Tests:  Narrative & Impression  CLINICAL DATA:  Follow-up thoracic and abdominal aortic aneurysms  EXAM: CT ANGIOGRAPHY CHEST, ABDOMEN AND PELVIS  TECHNIQUE: Non-contrast CT of the chest was initially obtained.  Multidetector CT imaging through the chest, abdomen and pelvis was performed using the standard protocol during bolus administration of intravenous contrast. Multiplanar reconstructed images and MIPs were obtained  and reviewed to evaluate the vascular anatomy.  CONTRAST:  66mL ISOVUE-370 IOPAMIDOL (ISOVUE-370) INJECTION 76%  COMPARISON:  CT angiogram chest abdomen pelvis, 07/18/2020  FINDINGS: CTA CHEST FINDINGS  Cardiovascular: Preferential opacification of the thoracic aorta. Unchanged enlargement of the tubular ascending thoracic aorta, measuring up to 4.7 x 4.6 cm. The aortic valve measures 2.6 cm in caliber. The sinuses of Valsalva measure 4.6 cm. Unchanged enlargement of the descending thoracic aorta, measuring up to 3.2 x 3.1 cm. Moderate mixed calcific atherosclerosis. Cardiomegaly. Three-vessel coronary artery calcifications. Left chest multi lead pacer. No pericardial effusion.  Mediastinum/Nodes: No enlarged mediastinal, hilar, or axillary lymph nodes. Small hiatal hernia. Thyroid gland, trachea, and esophagus demonstrate no significant findings.  Lungs/Pleura: Moderate centrilobular emphysema. Diffuse bilateral bronchial wall thickening. No pleural effusion or pneumothorax.  Musculoskeletal: No chest wall abnormality. No acute or significant osseous findings.  Review of the MIP images confirms the above findings.  CTA ABDOMEN AND PELVIS FINDINGS  VASCULAR  Redemonstrated aneurysm of the infrarenal abdominal aorta, measuring up to 3.8 x 3.7 cm in caliber with a large burden of eccentric mural thrombus. Moderate mixed calcific atherosclerosis standard branching pattern of the abdominal aorta with solitary bilateral renal arteries. There is redemonstrated, long segment, high-grade narrowing of the origin of the celiac axis, stenotic segment approximately 2.0 cm in length with poststenotic dilatation (series 5, image 143). The IMA origin is occluded, arising from the aneurysmal infrarenal aorta, with retrograde opacification of the vessel distal to the or.  Review of the MIP images confirms the above findings.  NON-VASCULAR  Hepatobiliary: No solid liver  abnormality is seen. No gallstones, gallbladder wall thickening, or biliary dilatation.  Pancreas: Fatty atrophy of the pancreatic parenchyma. No pancreatic ductal dilatation or surrounding inflammatory changes.  Spleen: Normal in size without significant abnormality.  Adrenals/Urinary Tract: Adrenal glands are unremarkable. Kidneys are normal, without renal calculi, solid lesion, or hydronephrosis. Bladder is unremarkable.  Stomach/Bowel: Stomach is within normal limits. Appendix appears normal. No evidence of bowel wall thickening, distention, or inflammatory changes.  Lymphatic: No enlarged abdominal or pelvic lymph nodes.  Reproductive: Prostate brachytherapy pellets.  Other: Fat containing bilateral inguinal hernias, right greater than left. No abdominopelvic ascites.  Musculoskeletal: No acute or significant osseous findings.  Review of the MIP images confirms the above findings.  IMPRESSION: 1. Unchanged enlargement of the tubular ascending thoracic aorta, measuring up to 4.7 x 4.6 cm. Unchanged enlargement of the descending thoracic aorta, measuring up to 3.2 x 3.1 cm. Ascending thoracic aortic aneurysm. Recommend semi-annual imaging followup by CTA or MRA and referral to cardiothoracic surgery if not already obtained. This recommendation follows 2010 ACCF/AHA/AATS/ACR/ASA/SCA/SCAI/SIR/STS/SVM Guidelines for the Diagnosis and Management of Patients With Thoracic Aortic Disease. Circulation. 2010; 121: O350-K938. Aortic aneurysm NOS (ICD10-I71.9) 2. Redemonstrated aneurysm of  the infrarenal abdominal aorta, measuring up to 3.8 x 3.7 cm in caliber with a large burden of eccentric mural thrombus. Recommend follow-up ultrasound every 2 years not otherwise imaged per above recommendations. This recommendation follows ACR consensus guidelines: White Paper of the ACR Incidental Findings Committee II on Vascular Findings. J Am Coll Radiol 2013; 10:789-794. 3.  There is redemonstrated, long segment, high-grade narrowing of the origin of the celiac axis, stenotic segment approximately 2.0 cm in length with poststenotic dilatation. 4. Moderate mixed calcific atherosclerosis throughout. 5. Coronary artery disease. 6. Emphysema.  Aortic Atherosclerosis (ICD10-I70.0) and Emphysema (ICD10-J43.9).   Electronically Signed   By: Eddie Candle M.D.   On: 01/29/2021 14:58      Impression:  This 80 year old gentleman has a stable 4.6 x 4.7 cm fusiform ascending aortic aneurysm.  This is still well below the surgical threshold of 5.5 cm and there is a very low risk of complications at this size.  With his advanced metastatic prostate cancer he would not be a candidate for surgical treatment.  He also has a 3.8 x 3.7 cm infrarenal abdominal aortic aneurysm with a large amount of eccentric mural thrombus but this does not require any treatment at this size.  He also has long segment, high-grade narrowing of the origin of the celiac axis and an occluded IMA.  He has no symptoms of intestinal ischemia.  I reviewed the CT images with him and his wife.  I stressed the importance of continued good blood pressure control in preventing further enlargement and acute aortic dissection.  I think it is worthwhile following up on his aneurysm as long as he is doing well for prognostic purposes.  Plan:  He will return to see me in 1 year with a CTA of the chest, abdomen, and pelvis.  I spent 20 minutes performing this established patient evaluation and > 50% of this time was spent face to face counseling and coordinating the care of this patient's aortic aneurysm.    Gaye Pollack, MD [facility] [phone]

## 2021-01-29 NOTE — Telephone Encounter (Signed)
Refilled once in primary provider's absence 

## 2021-01-29 NOTE — Telephone Encounter (Signed)
Pts spouse is calling in stating that the pt is out of Rx Lorazepam (ATIVAN) 1 MG and would like to see if they can get it today.  [facility] [place], [place]

## 2021-01-29 NOTE — Telephone Encounter (Signed)
Can you refill in pcp's absence?

## 2021-01-30 ENCOUNTER — Telehealth: Payer: Self-pay | Admitting: Internal Medicine

## 2021-01-30 NOTE — Telephone Encounter (Signed)
Called and spoke with wife, Tony [name], on [place] regarding CT on 02/18/21.  Advised that he has not seen Dr. Annamaria Boots since February of 2022.   He had a CT angio on 01/29/21 by thoracic and is not due for a f/u CT for 1 year.  Patient's [place] the [place] at [place].  Advised to let the patient know he can call us back with any questions.  She verbalized understanding.  Nothing further needed.

## 2021-01-30 NOTE — Telephone Encounter (Signed)
PT has chest CT sched for 6/7 at [facility].  Pt states he is claustrophobic & would like to [facility] [place] to help.

## 2021-02-13 ENCOUNTER — Telehealth: Payer: Self-pay | Admitting: Internal Medicine

## 2021-02-13 ENCOUNTER — Other Ambulatory Visit: Payer: Self-pay | Admitting: Family Medicine

## 2021-02-13 ENCOUNTER — Other Ambulatory Visit: Payer: Self-pay | Admitting: Oncology

## 2021-02-13 DIAGNOSIS — C61 Malignant neoplasm of prostate: Secondary | ICD-10-CM

## 2021-02-13 NOTE — Telephone Encounter (Addendum)
Pt has [facility] Medicare & order has been sent to [facility].  Order was not sent until 5/17 due to it did not hit our [employer] when it was placed. [employer] takes pt's insurance.  I called pt to find out why they are needing order sent to new dme and had to leave a vm.  I called [facility] & spoke to [facility].  He states last contact with pt was on 5/24 and they explained shortage.  The only thing insurance does not cover is cleaning machine that the pt inquired about and he states insurance does not cover this machine for any dme.  Will wait for pt to call me back so I can explain.  Pt is still on their wait list.

## 2021-02-13 NOTE — Telephone Encounter (Signed)
PCC's can you assist?  Message routed to [facility] pool to advise

## 2021-02-13 NOTE — Telephone Encounter (Signed)
Spoke to pt's wife and explained insurance will not cover cleaning machine.  She states ok - they will pay for that themselves.  Nothing further needed.

## 2021-02-26 ENCOUNTER — Other Ambulatory Visit: Payer: Self-pay | Admitting: Family Medicine

## 2021-02-26 DIAGNOSIS — F418 Other specified anxiety disorders: Secondary | ICD-10-CM

## 2021-02-26 NOTE — Telephone Encounter (Signed)
Last filled 01/29/2021 Last OV 11/18/2020  Ok to fill?

## 2021-03-03 ENCOUNTER — Other Ambulatory Visit: Payer: Self-pay | Admitting: *Deleted

## 2021-03-03 ENCOUNTER — Telehealth: Payer: Self-pay | Admitting: Pharmacist

## 2021-03-03 DIAGNOSIS — C61 Malignant neoplasm of prostate: Secondary | ICD-10-CM

## 2021-03-03 MED ORDER — ABIRATERONE ACETATE 250 MG PO TABS: ORAL_TABLET | Freq: Every day | ORAL | 3 refills | Status: DC

## 2021-03-03 NOTE — Chronic Care Management (AMB) (Addendum)
Date- Patient called to remind of appointment with Tony [name] on 03/04/2021 at 10:00 am- spoke with patient's wife.   Patient aware of appointment date, time, and type of appointment. Patient aware to have/bring all medications, supplements, blood pressure and/or blood sugar logs to visit.  Questions: Have you had any recent office visit or specialist visit outside of [place]? No  Are there any concerns you would like to discuss during your office visit? No   Are you having any problems obtaining your medications? (Whether it pharmacy issues or cost)  Patient wife states she would like to see if he could get help with the cost of his inhalers .  If patient has any PAP medications ask if they are having any problems getting their PAP medication or refill? He does have patient assistance but is not having issues.  Star Rating Drug: Medication Dispensed  Quantity Pharmacy  Atorvastatin 20 mg [facility]02.[facility]22 [facility]    Any gaps in medications fill history? No  Maia Breslow, [place] Pharmacist Assistant [phone]

## 2021-03-04 ENCOUNTER — Ambulatory Visit (INDEPENDENT_AMBULATORY_CARE_PROVIDER_SITE_OTHER): Payer: Medicare Other | Admitting: Pharmacist

## 2021-03-04 DIAGNOSIS — I10 Essential (primary) hypertension: Secondary | ICD-10-CM

## 2021-03-04 DIAGNOSIS — J449 Chronic obstructive pulmonary disease, unspecified: Secondary | ICD-10-CM | POA: Diagnosis not present

## 2021-03-04 NOTE — Progress Notes (Signed)
Chronic Care Management Pharmacy Note  03/04/2021 Name:  Tony [name] [facility] MRN:  [id number] DOB:  1941-06-10  Summary: Patients breathing has improved since increasing Advair dose   Recommendations/Changes made from today's visit: -Discussed patient assistance opportunities for inhaler but must be switched to alternative brand name LABA/ICS and patient prefers to hold off for now -Recommended home monitoring of BP   Plan: Follow up in 6 months  Subjective: Tony [name] is an 80 y.o. year old male who is a primary patient of [name], [name], MD.  The CCM team was consulted for assistance with disease management and care coordination needs.    Engaged with patient by telephone for follow up visit in response to provider referral for pharmacy case management and/or care coordination services.   Consent to Services:  The patient was given information about Chronic Care Management services, agreed to services, and gave verbal consent prior to initiation of services.  Please see initial visit note for detailed documentation.   Patient Care Team: [name], [name], MD as PCP - General (Family Medicine) Sanda Klein, MD as PCP - Cardiology (Cardiology) Wyatt Portela, MD as Consulting Physician (Oncology) Viona Gilmore, [facility] as Pharmacist (Pharmacist)  Recent office visits: 11/18/20 [name], MD: Patient presented for annual exam. No medication changes.  11/07/20 Grier Mitts, MD: Patient presented for video visit for cough. Prescribed azithromycin.   07/31/20 [name], MD: Patient presented for hospitalization follow up.   Recent consult visits: 01/29/21 Gilford Raid, MD (cardiothoracic surgery): Patient presented for follow up.   01/01/21 Wayna Chalet, MD (oncology): Patient presented for prostate neoplasm follow up and Eligard infusion.  12/30/20 Sanda Klein, MD (cardiology): Patient presented for SSS follow up and pacemaker check.  10/29/20 Baird Lyons, MD (pulmonary): Patient presented for OSA follow up.   10/03/20 Wayna Chalet, MD (oncology): Patient presented for prostate neoplasm follow up and Eligard infusion.  09/09/20 Baird Lyons, MD (pulmonary): Patient presented for follow up and repeat pulmonary function test.  08/07/20 Coletta Memos, NP (cardiology): Patient presented for SSS follow up.   Hospital visits: 11/4-11/9/21 Patient was admitted for chest pain and hypoxia.  Objective:  Lab Results  Component Value Date   CREATININE 1.10 01/01/2021   BUN 16 01/01/2021   GFR 78.21 02/18/2018   GFRNONAA >60 01/01/2021   GFRAA 77 07/31/2020   NA 140 01/01/2021   K 4.7 01/01/2021   CALCIUM 9.3 01/01/2021   CO2 26 01/01/2021    Lab Results  Component Value Date/Time   HGBA1C 5.9 (H) 07/19/2020 05:58 AM   HGBA1C 5.6 05/21/2020 09:52 AM   HGBA1C 6.2 02/13/2019 10:54 AM   GFR 78.21 02/18/2018 09:01 AM   GFR 93.27 01/21/2018 10:12 AM    Last diabetic Eye exam: No results found for: HMDIABEYEEXA  Last diabetic Foot exam: No results found for: HMDIABFOOTEX   Lab Results  Component Value Date   CHOL 139 05/21/2020   HDL 51 05/21/2020   LDLCALC 71 05/21/2020   TRIG 89 05/21/2020   CHOLHDL 2.7 05/21/2020    Hepatic Function Latest Ref Rng & Units 01/01/2021 10/03/2020 07/31/2020  Total Protein 6.5 - 8.1 g/dL 7.0 7.2 6.4  Albumin 3.5 - 5.0 g/dL 4.0 4.0 -  AST 15 - 41 U/L 18 32 20  ALT 0 - 44 U/L 14 20 40  Alk Phosphatase 38 - 126 U/L 88 84 -  Total Bilirubin 0.3 - 1.2 mg/dL 0.7 0.7 0.9  Bilirubin, Direct 0.0 - 0.2  mg/dL - - -    Lab Results  Component Value Date/Time   TSH 0.76 05/13/2016 11:55 AM    CBC Latest Ref Rng & Units 01/01/2021 10/03/2020 07/31/2020  WBC 4.0 - 10.5 K/uL 8.8 6.0 10.5  Hemoglobin 13.0 - 17.0 g/dL 14.4 15.1 14.8  Hematocrit 39.0 - 52.0 % 44.1 46.0 43.4  Platelets 150 - 400 K/uL 180 172 230    No results found for: VD25OH  Clinical ASCVD: Yes  The 10-year ASCVD risk score Mikey Bussing DC  Jr., et al., 2013) is: 21.2%   Values used to calculate the score:     Age: 39 years     Sex: Male     Is Non-Hispanic African American: No     Diabetic: No     Tobacco smoker: No     Systolic Blood Pressure: 295 mmHg     Is BP treated: Yes     HDL Cholesterol: 51 mg/dL     Total Cholesterol: 139 mg/dL    Depression screen [facility] 2/9 11/18/2020 07/30/2020 06/19/2020  Decreased Interest 0 0 0  Down, Depressed, Hopeless - 0 0  PHQ - 2 Score 0 0 0  Altered sleeping 2 - -  Tired, decreased energy 1 - -  Change in appetite 0 - -  Feeling bad or failure about yourself  0 - -  Trouble concentrating 0 - -  Moving slowly or fidgety/restless 1 - -  Suicidal thoughts 0 - -  PHQ-9 Score 4 - -  Difficult doing work/chores Not difficult at all - -  Some recent data might be hidden      Social History   Tobacco Use  Smoking Status Former   Pack years: 0.00   Types: Cigars   Quit date: 09/14/2012   Years since quitting: 8.4  Smokeless Tobacco Never  Tobacco Comments   little cigars; the small ones"   BP Readings from Last 3 Encounters:  01/29/21 100/75  01/01/21 (!) 120/92  12/30/20 100/72   Pulse Readings from Last 3 Encounters:  01/29/21 86  01/01/21 82  12/30/20 80   Wt Readings from Last 3 Encounters:  01/29/21 219 lb (99.3 kg)  01/01/21 218 lb 4.8 oz (99 kg)  12/30/20 220 lb (99.8 kg)    Assessment/Interventions: Review of patient past medical history, allergies, medications, health status, including review of consultants reports, laboratory and other test data, was performed as part of comprehensive evaluation and provision of chronic care management services.   SDOH:  (Social Determinants of Health) assessments and interventions performed: Yes     CCM Care Plan  Allergies  Allergen Reactions   Penicillins Rash    Has patient had a PCN reaction causing immediate rash, facial/tongue/throat swelling, SOB or lightheadedness with hypotension: Yes Has patient had a PCN  reaction causing severe rash involving mucus membranes or skin necrosis: No Has patient had a PCN reaction that required hospitalization: No Has patient had a PCN reaction occurring within the last 10 years: No If all of the above answers are "NO", then may proceed with Cephalosporin use.    Medications Reviewed Today     Reviewed by Gaye Pollack, MD (Physician) on 01/29/21 at [place] Status: <None>   Medication Order Taking? Sig Documenting Provider Last Dose Status Informant  abiraterone acetate (ZYTIGA) 250 MG tablet [id number] Yes TAKE 4 TABLETS (1,000 MG TOTAL) BY MOUTH DAILY. TAKE ON AN EMPTY STOMACH 1 HOUR BEFORE OR 2 HOURS AFTER A MEAL Shadad,  Mathis Dad, MD Taking Active   acetaminophen (TYLENOL) 500 MG tablet [id number] Yes Take 1,000 mg by mouth every 6 (six) hours as needed for headache (pain).  [provider] Taking Active Spouse/Significant Other  albuterol (VENTOLIN HFA) 108 (90 Base) MCG/ACT inhaler [id number] Yes TAKE 2 PUFFS BY MOUTH EVERY 6 HOURS AS NEEDED FOR WHEEZE OR SHORTNESS OF BREATH [name], [name], MD Taking Active   amLODipine (NORVASC) 2.5 MG tablet [id number] Yes TAKE 1 TABLET BY MOUTH EVERY DAY [name], [name], MD Taking Active   Ascorbic Acid (VITAMIN C PO) [id number] Yes Take 1 tablet by mouth daily. [provider] Taking Active   aspirin EC 81 MG tablet [id number] Yes Take 81 mg by mouth daily. [provider] Taking Active Multiple Informants  atorvastatin (LIPITOR) 20 MG tablet [id number] Yes TAKE 1 TABLET BY MOUTH EVERY DAY Croitoru, Mihai, MD Taking Active   carbamide peroxide (DEBROX) 6.5 % OTIC solution [id number] Yes Place 5 drops into both ears daily as needed (for earwax). [provider] Taking Active            Med Note Damita Dunnings, MACI D   Fri Jul 19, 2020 10:02 AM) LF 05/31/20 #90  citalopram (CELEXA) 10 MG tablet [id number] Yes TAKE 1 TABLET BY MOUTH EVERY DAY [name], [name], MD Taking Active   diclofenac sodium  (VOLTAREN) 1 % GEL [id number] Yes Apply 4 g topically 4 (four) times daily.  Patient taking differently: Apply 4 g topically 4 (four) times daily as needed (pain).   [name], [name], MD Taking Active   docusate sodium (COLACE) 100 MG capsule [id number] Yes Take 100 mg by mouth daily with supper. [provider] Taking Active Spouse/Significant Other           Med Note Knox Royalty   Tue Jul 30, 2020 12:05 PM) with wife, Tony [name], on [facility] DPR, takes prn- "usually" once a day  fluticasone (FLONASE) 50 MCG/ACT nasal spray [id number] Yes PLACE 1 SPRAY INTO BOTH NOSTRILS DAILY AS NEEDED FOR ALLERGIES OR RHINITIS (AT BEDTIME).  Patient taking differently: Place 1 spray into both nostrils at bedtime as needed for allergies or rhinitis.   [name], [name], MD Taking Active   Fluticasone-Salmeterol [facility] INHUB) 250-50 Earlie Raveling [id number] Yes Inhale 1 puff into the lungs in the morning and at bedtime. [name], [name], MD Taking Active   furosemide (LASIX) 20 MG tablet [id number] Yes TAKE 1 TABLET BY MOUITH DAILY FOR 5 DAYS , THEN DAILY AS NEEDED FOR EDEMA [name], [name], MD Taking Active   LORazepam (ATIVAN) 1 MG tablet [id number] Yes TAKE 1 TABLET (1 MG TOTAL) BY MOUTH 2 (TWO) TIMES DAILY AS NEEDED. [name], [name], MD Taking Active   metoprolol tartrate (LOPRESSOR) 25 MG tablet [id number] Yes TAKE 1 TABLET BY MOUTH TWICE A DAY Croitoru, Mihai, MD Taking Active   Multiple Vitamin (MULTIVITAMIN WITH MINERALS) TABS tablet [id number] Yes Take 0.5 tablets by mouth every other day. Centrum [provider] Taking Active Multiple Informants  omeprazole (PRILOSEC) 20 MG capsule [id number] Yes TAKE 1 CAPSULE (20 MG TOTAL) BY MOUTH DAILY BEFORE BREAKFAST. [name], [name], MD Taking Active Multiple Informants           Med Note Damita Dunnings, MACI D   Fri Jul 19, 2020 10:03 AM) LF 06/03/20 #90  predniSONE (DELTASONE) 5 MG tablet [id number] Yes TAKE 1 TABLET BY MOUTH EVERY DAY WITH BREAKFAST Shadad, Mathis Dad, MD  Taking Active   Tetrahydrozoline HCl Karma Lew  OP) [id number] Yes Place 1 drop into both eyes daily as needed (red eyes). 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   Coronary artery disease involving native coronary artery of native heart without angina pectoris 06/08/2018   Sick sinus syndrome ([place]) 06/08/2018   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   COPD mixed type ([place]) 02/03/2018   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   Acute appendicitis 01/21/2018   Perforated appendicitis 01/21/2018   Chronic fatigue 06/01/2017   Essential hypertension, benign 07/09/2016   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[place]) 07/06/2016   Coronary artery calcification seen on CAT scan 05/15/2016   Anxiety disorder 04/08/2016   Insomnia 04/08/2016   Atherosclerosis of coronary artery without angina pectoris 04/08/2016   Tobacco use disorder 04/08/2016   Hypercholesterolemia 04/08/2016   Malignant neoplasm of prostate ([place]) 02/23/2014    Immunization History  Administered Date(s) Administered   Fluad Quad(high Dose 65+) 05/17/2019, 05/21/2020   Influenza, High Dose Seasonal PF 05/05/2016, 06/01/2017, 06/22/2018   PFIZER(Purple Top)SARS-COV-2 Vaccination 10/05/2019, 10/26/2019, 06/18/2020   Pneumococcal Polysaccharide-23 07/31/2020   Td 02/13/2019   Patient reports he has had some constipation but is drinking lots of water and taking fiber tablets to help move things along.  Conditions to be  addressed/monitored:  Hypertension, Hyperlipidemia, Coronary Artery Disease, Anxiety and prostate cancer, insomnia, and prediabetes  Care Plan : [place]  Updates made by Viona Gilmore, [place] since 03/04/2021 12:00 AM     Problem: Problem: Hypertension, Hyperlipidemia, Coronary Artery Disease, Anxiety and prostate cancer, insomnia, and prediabetes      Long-Range Goal: Patient-Specific Goal   Start Date: 11/01/2020  Expected End Date: 11/01/2021  Recent Progress: On track  Priority: High  Note:   Current Barriers:  Unable to independently afford treatment regimen Unable to independently monitor therapeutic efficacy  Pharmacist Clinical Goal(s):  Patient will verbalize ability to afford treatment regimen achieve adherence to monitoring guidelines and medication adherence to achieve therapeutic efficacy through collaboration with PharmD and provider.   Interventions: 1:1 collaboration with [name], [name], MD regarding development and update of comprehensive plan of care as evidenced by provider attestation and co-signature Inter-disciplinary care team collaboration (see longitudinal plan of care) Comprehensive medication review performed; medication list updated in electronic medical record  Hypertension (BP goal <140/90) -Controlled -Current treatment: Amlodipine 2.5 mg daily  Furosemide 20 mg daily as needed Metoprolol tartrate 25 mg twice daily -Medications previously tried: n/a  -Current home readings: has not checked in a few weeks -Current dietary habits: did not discuss -Current exercise habits:  PT exercises a couple times a week (not every day) -Denies hypotensive/hypertensive symptoms -Educated on Exercise goal of 150 minutes per week; Importance of home blood pressure monitoring; -Counseled to monitor BP at home weekly, document, and provide log at future appointments -Recommended to continue current medication  Hyperlipidemia/Coronary artery  disease: (LDL goal < 70) -Not ideally controlled -Current treatment: Aspirin 81 mg daily  Atorvastatin 20 mg daily  -Medications previously tried:  none -Current dietary patterns: did not discuss -Current exercise habits: PT exercises a couple times a week (not every day) -Educated on Cholesterol goals;  Exercise goal of 150 minutes per week; -Counseled on diet and exercise extensively Recommended to continue current medication  Emphysema lung (Goal: control symptoms and prevent exacerbations) -Controlled -Current treatment  Albuterol 108 mcg/act 2 puff every 6 hours as needed Advair 100-50 mcg/dose 1 puff twice daily -Medications previously tried: n/a  -Pulmonary function testing: n/a -Exacerbations requiring treatment in last 6 months: none -Patient reports consistent use of maintenance inhaler -Frequency of rescue inhaler use: every other day -Counseled on When to use rescue inhaler Differences between maintenance and rescue inhalers -Assessed patient finances. Patient may qualify for patient assistance depending on use of brand name inhalers. Patient prefers to keep medication as is for now.  Pre-diabetes (A1c goal <6.5%) -Controlled -Current medications: No medications -Medications previously tried: none  -Current home glucose readings fasting glucose: n/a post prandial glucose: n/a -Denies hypoglycemic/hyperglycemic symptoms -Current exercise: PT exercises a couple times a week (not every day) -Educated onA1c and blood sugar goals; -Counseled to check feet daily and get yearly eye exams -Counseled on diet and exercise extensively  Depression (Goal: minimize symptoms of depression) -Controlled -Current treatment: Citalopram 10 mg daily  Lorazepam 1 mg twice daily as needed (twice daily consistently)  -Medications previously tried/failed: none -PHQ9: 0 -GAD7: n/a -Educated on Benefits of medication for symptom control -Recommended to continue current  medication Counseled on long term risks associated with benzodiazepines such as falls, fractures, and memory loss  Prostate cancer -Controlled -Current treatment  Abiraterone (Zytiga) 250 mg 4 tablets daily (AM)  Prednisone 5 mg daily (AM)  -Medications previously tried: n/a  -Recommended to continue current medication  GERD (Goal: minimize symptoms of heartburn or acid reflux) -Controlled -Current treatment  Omeprazole 20 mg daily before breakfast -Medications previously tried: none  -Counseled on diet and exercise extensively Recommended to continue current medication   Health Maintenance -Vaccine gaps: Shingles -Current therapy:  Acetaminophen 500 mg q6hr PRN  Carbamide peroxide 6.5% otic solution Flonase Nasal Spray 50 mcg/act 1 spray daily PRN  Multivitamin 1/2 tablet daily (Centrum)  Miralax 17g daily PRN (rarely uses)  Potassium Chloride SA 20 mEq BID (stopped in July) Senna 8.6 mg 2 tablets daily as needed  -Educated on Cost vs benefit of each product must be carefully weighed by individual consumer -Patient is satisfied with current therapy and denies issues -Recommended to continue current medication  Patient Goals/Self-Care Activities Patient will:  - take medications as prescribed check blood pressure weekly, document, and provide at future appointments  Follow Up Plan: Telephone follow up appointment with care management team member scheduled for: 6 months        Compliance/Adherence/Medication fill history: Care Gaps: Shingrix, COVID booster  Star-Rating Drugs: Atorvastatin 20 mg - last [facility]/14/20 [facility]  Medication Assistance: Application for Advair HFA and Ventolin HFA  medication assistance program. in process.  Anticipated assistance start date unknown where patient is in application process.  See plan of care for additional detail.  Patient's [facility] pharmacy [facility]  [id number] [address]Phone: [phone]: [phone] [address]Phone: [phone]: [phone] [address] [id number]: [phone]: [phone]  Uses pill box? Yes Pt endorses 100% compliance  We discussed: Current pharmacy is preferred with insurance plan and patient is satisfied with pharmacy services Patient decided to: Continue current medication management strategy  Care Plan and Follow Up Patient Decision:  Patient agrees to Care Plan and Follow-up.  Plan: Telephone follow up appointment with care management team member scheduled for:  6 months  Jeni Salles, PharmD [place] Pharmacist [place] at [place] [phone]

## 2021-03-05 ENCOUNTER — Telehealth: Payer: Self-pay | Admitting: Internal Medicine

## 2021-03-05 NOTE — Telephone Encounter (Signed)
Forwarding to [facility] per protocol since referral already placed. There is a msg in the referral from [place] about cleaning devices, but BIPAP was ordered.

## 2021-03-05 NOTE — Telephone Encounter (Signed)
Order was placed in Feb but per note on 6/2 order did not hit our workque because of the way it was placed.  Order was sent on 5/17 and per phone call to [facility] on 6/2 [place] states they contacted pt on 5/24 to make them aware of shortage and that they would be in contact with them to keep them updated.  I just called Brad and left a vm for him to let me know if there has been any updates.

## 2021-03-05 NOTE — Telephone Encounter (Signed)
Brad called back from [employer].  He states 5/24 was last contact with pt.  He is going to have someone from there to reach out to pt since it has been right at a month since last contact.  Also, in looking at order it mentions a Wynonia Musty is ok so they are going to discuss that with pt.  Nothing further needed.

## 2021-03-07 DIAGNOSIS — G4733 Obstructive sleep apnea (adult) (pediatric): Secondary | ICD-10-CM | POA: Diagnosis not present

## 2021-03-07 DIAGNOSIS — R269 Unspecified abnormalities of gait and mobility: Secondary | ICD-10-CM | POA: Diagnosis not present

## 2021-03-14 ENCOUNTER — Telehealth: Payer: Self-pay

## 2021-03-14 DIAGNOSIS — R269 Unspecified abnormalities of gait and mobility: Secondary | ICD-10-CM | POA: Diagnosis not present

## 2021-03-14 DIAGNOSIS — G4733 Obstructive sleep apnea (adult) (pediatric): Secondary | ICD-10-CM | POA: Diagnosis not present

## 2021-03-14 NOTE — Telephone Encounter (Signed)
Called and spoke with patients wife [name] per DPR to get him scheduled for follow up after getting set up with Bipap from [place]. Dr. Annamaria Boots are you ok with me using the RNA slot on Wednesday Aug 3rd? They live about 45 minutes away in [place] and have another appointment on that day.  Please advise

## 2021-03-15 NOTE — Telephone Encounter (Signed)
Yes Ok to use that Therapist, sports slot

## 2021-03-18 ENCOUNTER — Ambulatory Visit (INDEPENDENT_AMBULATORY_CARE_PROVIDER_SITE_OTHER): Payer: Medicare Other

## 2021-03-18 DIAGNOSIS — I495 Sick sinus syndrome: Secondary | ICD-10-CM

## 2021-03-18 LAB — CUP PACEART REMOTE DEVICE CHECK
Battery Remaining Longevity: 134 mo
Battery Voltage: 3.02 V
Brady Statistic AP VP Percent: 0.06 %
Brady Statistic AP VS Percent: 78.04 %
Brady Statistic AS VP Percent: 0.01 %
Brady Statistic AS VS Percent: 21.9 %
Brady Statistic RA Percent Paced: 77.88 %
Brady Statistic RV Percent Paced: 0.06 %
Date Time Interrogation Session: 20220705030001
Implantable Lead Implant Date: 20191001
Implantable Lead Implant Date: 20191001
Implantable Lead Location: 753859
Implantable Lead Location: 753860
Implantable Lead Model: 5076
Implantable Lead Model: 5076
Implantable Pulse Generator Implant Date: 20191001
Lead Channel Impedance Value: 323 Ohm
Lead Channel Impedance Value: 323 Ohm
Lead Channel Impedance Value: 456 Ohm
Lead Channel Impedance Value: 475 Ohm
Lead Channel Pacing Threshold Amplitude: 0.5 V
Lead Channel Pacing Threshold Amplitude: 0.75 V
Lead Channel Pacing Threshold Pulse Width: 0.4 ms
Lead Channel Pacing Threshold Pulse Width: 0.4 ms
Lead Channel Sensing Intrinsic Amplitude: 2.75 mV
Lead Channel Sensing Intrinsic Amplitude: 2.75 mV
Lead Channel Sensing Intrinsic Amplitude: 2.875 mV
Lead Channel Sensing Intrinsic Amplitude: 2.875 mV
Lead Channel Setting Pacing Amplitude: 1.5 V
Lead Channel Setting Pacing Amplitude: 2.5 V
Lead Channel Setting Pacing Pulse Width: 0.4 ms
Lead Channel Setting Sensing Sensitivity: 0.9 mV

## 2021-03-18 NOTE — Telephone Encounter (Signed)
Patient has been scheduled  Next Appt With Pulmonology Baird Lyons, MD)04/16/2021 at 11:30 AM

## 2021-03-28 ENCOUNTER — Telehealth: Payer: Self-pay | Admitting: Pharmacist

## 2021-03-28 NOTE — Telephone Encounter (Signed)
Called patient's wife back as she called the office looking to speak with me. Patient's wife wants to start the patient assistance process to change his Advair inhaler to a cheaper alternative. Explained that the patient assistance form would be mailed for an alternative inhaler as his current one does not have a patient assistance program. Patient's wife is aware to call the office back in 2-3 weeks if she does not receive it in the mail at that point.

## 2021-04-01 NOTE — Progress Notes (Signed)
    Chronic Care Management Pharmacy Assistant   Name: Tony [name] [name]  MRN: [id number] DOB: 1941-01-14  Completed patient assistance application for Symbicort inhaler for patient per Tony [name] the clinical pharmacist request. Patient aware of application and does need to be called per Tony [name]. Sent to Tony [name] PTM to print and mail to patient with instructions.  Tony [name]  Clinical Pharmacist Assistant [phone]  Medications: Outpatient Encounter Medications as of 03/28/2021  Medication Sig Note   abiraterone acetate (ZYTIGA) 250 MG tablet TAKE 4 TABLETS (1,000 MG TOTAL) BY MOUTH DAILY. TAKE ON AN EMPTY STOMACH 1 HOUR BEFORE OR 2 HOURS AFTER A MEAL    acetaminophen (TYLENOL) 500 MG tablet Take 1,000 mg by mouth every 6 (six) hours as needed for headache (pain).     albuterol (VENTOLIN HFA) 108 (90 Base) MCG/ACT inhaler TAKE 2 PUFFS BY MOUTH EVERY 6 HOURS AS NEEDED FOR WHEEZE OR SHORTNESS OF BREATH    amLODipine (NORVASC) 2.5 MG tablet TAKE 1 TABLET BY MOUTH EVERY DAY    Ascorbic Acid (VITAMIN C PO) Take 1 tablet by mouth daily.    aspirin EC 81 MG tablet Take 81 mg by mouth daily.    atorvastatin (LIPITOR) 20 MG tablet TAKE 1 TABLET BY MOUTH EVERY DAY    carbamide peroxide (DEBROX) 6.5 % OTIC solution Place 5 drops into both ears daily as needed (for earwax). 07/19/2020: LF 05/31/20 #90   citalopram (CELEXA) 10 MG tablet TAKE 1 TABLET BY MOUTH EVERY DAY    diclofenac Sodium (VOLTAREN) 1 % GEL AAPLY 4 GRAMS TOPICALLY 4 TIMES DAILY    docusate sodium (COLACE) 100 MG capsule Take 100 mg by mouth daily with supper. 07/30/2020: with wife, Tony [name], on [place], takes prn- "usually" once a day   fluticasone (FLONASE) 50 MCG/ACT nasal spray PLACE 1 SPRAY INTO BOTH NOSTRILS DAILY AS NEEDED FOR ALLERGIES OR RHINITIS (AT BEDTIME). (Patient taking differently: Place 1 spray into both nostrils at bedtime as needed for allergies or rhinitis.)    Fluticasone-Salmeterol  (WIXELA INHUB) 250-50 MCG/DOSE AEPB Inhale 1 puff into the lungs in the morning and at bedtime.    furosemide (LASIX) 20 MG tablet TAKE 1 TABLET BY MOUITH DAILY FOR 5 DAYS , THEN DAILY AS NEEDED FOR EDEMA    LORazepam (ATIVAN) 1 MG tablet TAKE 1 TABLET BY MOUTH TWICE A DAY AS NEEDED    metoprolol tartrate (LOPRESSOR) 25 MG tablet TAKE 1 TABLET BY MOUTH TWICE A DAY    Multiple Vitamin (MULTIVITAMIN WITH MINERALS) TABS tablet Take 0.5 tablets by mouth every other day. Centrum    omeprazole (PRILOSEC) 20 MG capsule TAKE 1 CAPSULE (20 MG TOTAL) BY MOUTH DAILY BEFORE BREAKFAST. 07/19/2020: LF 06/03/20 #90   predniSONE (DELTASONE) 5 MG tablet TAKE 1 TABLET BY MOUTH EVERY DAY WITH BREAKFAST    Tetrahydrozoline HCl (VISINE OP) Place 1 drop into both eyes daily as needed (red eyes).    No facility-administered encounter medications on file as of 03/28/2021.

## 2021-04-02 ENCOUNTER — Inpatient Hospital Stay: Payer: Medicare Other

## 2021-04-02 ENCOUNTER — Inpatient Hospital Stay: Payer: Medicare Other | Attending: Oncology

## 2021-04-02 ENCOUNTER — Other Ambulatory Visit: Payer: Self-pay

## 2021-04-02 ENCOUNTER — Inpatient Hospital Stay (HOSPITAL_BASED_OUTPATIENT_CLINIC_OR_DEPARTMENT_OTHER): Payer: Medicare Other | Admitting: Oncology

## 2021-04-02 VITALS — BP 106/90 | HR 71 | Temp 98.7°F | Resp 18 | Ht 68.0 in | Wt 225.6 lb

## 2021-04-02 DIAGNOSIS — E876 Hypokalemia: Secondary | ICD-10-CM | POA: Insufficient documentation

## 2021-04-02 DIAGNOSIS — C61 Malignant neoplasm of prostate: Secondary | ICD-10-CM

## 2021-04-02 DIAGNOSIS — I10 Essential (primary) hypertension: Secondary | ICD-10-CM | POA: Insufficient documentation

## 2021-04-02 DIAGNOSIS — Z5111 Encounter for antineoplastic chemotherapy: Secondary | ICD-10-CM | POA: Diagnosis not present

## 2021-04-02 LAB — CBC WITH DIFFERENTIAL (CANCER CENTER ONLY)
Abs Immature Granulocytes: 0.05 10*3/uL (ref 0.00–0.07)
Basophils Absolute: 0.1 10*3/uL (ref 0.0–0.1)
Basophils Relative: 1 %
Eosinophils Absolute: 0.1 10*3/uL (ref 0.0–0.5)
Eosinophils Relative: 2 %
HCT: 42.5 % (ref 39.0–52.0)
Hemoglobin: 13.8 g/dL (ref 13.0–17.0)
Immature Granulocytes: 1 %
Lymphocytes Relative: 10 %
Lymphs Abs: 0.8 10*3/uL (ref 0.7–4.0)
MCH: 30.7 pg (ref 26.0–34.0)
MCHC: 32.5 g/dL (ref 30.0–36.0)
MCV: 94.7 fL (ref 80.0–100.0)
Monocytes Absolute: 0.6 10*3/uL (ref 0.1–1.0)
Monocytes Relative: 8 %
Neutro Abs: 6.3 10*3/uL (ref 1.7–7.7)
Neutrophils Relative %: 78 %
Platelet Count: 184 10*3/uL (ref 150–400)
RBC: 4.49 MIL/uL (ref 4.22–5.81)
RDW: 13.2 % (ref 11.5–15.5)
WBC Count: 7.9 10*3/uL (ref 4.0–10.5)
nRBC: 0 % (ref 0.0–0.2)

## 2021-04-02 LAB — CMP (CANCER CENTER ONLY)
ALT: 14 U/L (ref 0–44)
AST: 18 U/L (ref 15–41)
Albumin: 3.7 g/dL (ref 3.5–5.0)
Alkaline Phosphatase: 79 U/L (ref 38–126)
Anion gap: 10 (ref 5–15)
BUN: 18 mg/dL (ref 8–23)
CO2: 26 mmol/L (ref 22–32)
Calcium: 9.1 mg/dL (ref 8.9–10.3)
Chloride: 103 mmol/L (ref 98–111)
Creatinine: 0.99 mg/dL (ref 0.61–1.24)
GFR, Estimated: >60 mL/min (ref >60)
Glucose, Bld: 118 mg/dL — ABNORMAL HIGH (ref 70–99)
Potassium: 4 mmol/L (ref 3.5–5.1)
Sodium: 139 mmol/L (ref 135–145)
Total Bilirubin: 0.7 mg/dL (ref 0.3–1.2)
Total Protein: 6.8 g/dL (ref 6.5–8.1)

## 2021-04-02 MED ORDER — LEUPROLIDE ACETATE (3 MONTH) 22.5 MG ~~LOC~~ KIT
22.5000 mg | PACK | Freq: Once | SUBCUTANEOUS | Status: AC
  Administered 2021-04-02: 22.5 mg via SUBCUTANEOUS

## 2021-04-02 MED ORDER — LEUPROLIDE ACETATE (3 MONTH) 22.5 MG ~~LOC~~ KIT
PACK | SUBCUTANEOUS | Status: AC
  Filled 2021-04-02: qty 22.5

## 2021-04-02 NOTE — Progress Notes (Signed)
Hematology and Oncology Follow Up Visit  Tony [name] [id number] [birth year] 80 y.o. 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  He was found to have Gleason score 4+5 = 9 and a PSA 15.7.  In 2015.   Prior Therapy:  He is status post radiation therapy for external beam radiation completed in September 2015 for a total of 45 gray. He also status post seed implant brachytherapy boost completed in October 2015.   He received total of 1 year of androgen deprivation 22,015 and 2016. His PSA nadir was to 0.47 in November 2016. His PSA was 4.08 in May 2017.  Current therapy:  Eligard 22.5 mg every 3 months.  This will be given today and repeated in November 2022.  Zytiga 1000 mg with prednisone started in August 2017.   Interim History: Tony [name] is here for a follow-up evaluation.  Since the last visit, he reports no major changes in his health.  He continues to tolerate Zytiga without any recent complaints.  He denies any nausea, vomiting or edema.  He denies any bone pain or pathological fractures.  Performance status quality of life remains excellent.         Medications: Unchanged on review. Current Outpatient Medications  Medication Sig Dispense Refill   abiraterone acetate (ZYTIGA) 250 MG tablet TAKE 4 TABLETS (1,000 MG TOTAL) BY MOUTH DAILY. TAKE ON AN EMPTY STOMACH 1 HOUR BEFORE OR 2 HOURS AFTER A MEAL 120 tablet 3   acetaminophen (TYLENOL) 500 MG tablet Take 1,000 mg by mouth every 6 (six) hours as needed for headache (pain).      albuterol (VENTOLIN HFA) 108 (90 Base) MCG/ACT inhaler TAKE 2 PUFFS BY MOUTH EVERY 6 HOURS AS NEEDED FOR WHEEZE OR SHORTNESS OF BREATH 8.5 each 2   amLODipine (NORVASC) 2.5 MG tablet TAKE 1 TABLET BY MOUTH EVERY DAY 90 tablet 2   Ascorbic Acid (VITAMIN C PO) Take 1 tablet by mouth daily.     aspirin  EC 81 MG tablet Take 81 mg by mouth daily.     atorvastatin (LIPITOR) 20 MG tablet TAKE 1 TABLET BY MOUTH EVERY DAY 90 tablet 2   carbamide peroxide (DEBROX) 6.5 % OTIC solution Place 5 drops into both ears daily as needed (for earwax).     citalopram (CELEXA) 10 MG tablet TAKE 1 TABLET BY MOUTH EVERY DAY 90 tablet 3   diclofenac Sodium (VOLTAREN) 1 % GEL AAPLY 4 GRAMS TOPICALLY 4 TIMES DAILY 400 [name] 2   docusate sodium (COLACE) 100 MG capsule Take 100 mg by mouth daily with supper.     fluticasone (FLONASE) 50 MCG/ACT nasal spray PLACE 1 SPRAY INTO BOTH NOSTRILS DAILY AS NEEDED FOR ALLERGIES OR RHINITIS (AT BEDTIME). (Patient taking differently: Place 1 spray into both nostrils at bedtime as needed for allergies or rhinitis.) 48 mL 1   Fluticasone-Salmeterol (WIXELA INHUB) 250-50 MCG/DOSE AEPB Inhale 1 puff into the lungs in the morning and at bedtime. 60 each 2   furosemide (LASIX) 20 MG tablet TAKE 1 TABLET BY MOUITH DAILY FOR 5 DAYS , THEN DAILY AS NEEDED FOR EDEMA 90 tablet 1   LORazepam (ATIVAN) 1 MG tablet TAKE 1 TABLET BY MOUTH TWICE A DAY AS NEEDED 60 tablet 3   metoprolol tartrate (LOPRESSOR) 25 MG tablet TAKE 1 TABLET BY MOUTH TWICE A DAY 180 tablet 2   Multiple Vitamin (MULTIVITAMIN WITH  MINERALS) TABS tablet Take 0.5 tablets by mouth every other day. Centrum     omeprazole (PRILOSEC) 20 MG capsule TAKE 1 CAPSULE (20 MG TOTAL) BY MOUTH DAILY BEFORE BREAKFAST. 90 capsule 3   predniSONE (DELTASONE) 5 MG tablet TAKE 1 TABLET BY MOUTH EVERY DAY WITH BREAKFAST 90 tablet 0   Tetrahydrozoline HCl (VISINE OP) Place 1 drop into both eyes daily as needed (red eyes).     No current facility-administered medications for this visit.     Allergies:  Allergies  Allergen Reactions   Penicillins Rash    Has patient had a PCN reaction causing immediate rash, facial/tongue/throat swelling, SOB or lightheadedness with hypotension: Yes Has patient had a PCN reaction causing severe rash involving mucus  membranes or skin necrosis: No Has patient had a PCN reaction that required hospitalization: No Has patient had a PCN reaction occurring within the last 10 years: No If all of the above answers are "NO", then may proceed with Cephalosporin use.      Physical Exam:      Blood pressure 106/90, pulse 71, temperature 98.7 F (37.1 C), temperature source Oral, resp. rate 18, height 5\' 8"  (1.727 m), weight 225 lb 9.6 oz (102.3 kg), SpO2 92 %.      ECOG: 1     General appearance: Alert, awake without any distress. Head: Atraumatic without abnormalities Oropharynx: Without any thrush or ulcers. Eyes: No scleral icterus. Lymph nodes: No lymphadenopathy noted in the cervical, supraclavicular, or axillary nodes Heart:regular rate and rhythm, without any murmurs or gallops.   Lung: Clear to auscultation without any rhonchi, wheezes or dullness to percussion. Abdomin: Soft, nontender without any shifting dullness or ascites. Musculoskeletal: No clubbing or cyanosis. Neurological: No motor or sensory deficits. Skin: No rashes or lesions.              Lab Results: Lab Results  Component Value Date   WBC 7.9 04/02/2021   HGB 13.8 04/02/2021   HCT 42.5 04/02/2021   MCV 94.7 04/02/2021   PLT 184 04/02/2021     Chemistry      Component Value Date/Time   NA 139 04/02/2021 0819   NA 139 02/03/2019 0857   NA 141 08/27/2017 0915   K 4.0 04/02/2021 0819   K 4.8 08/27/2017 0915   CL 103 04/02/2021 0819   CO2 26 04/02/2021 0819   CO2 26 08/27/2017 0915   BUN 18 04/02/2021 0819   BUN 13 02/03/2019 0857   BUN 16.2 08/27/2017 0915   CREATININE 0.99 04/02/2021 0819   CREATININE 1.07 07/31/2020 1325   CREATININE 0.9 08/27/2017 0915   GLU 105 01/10/2016 0000      Component Value Date/Time   CALCIUM 9.1 04/02/2021 0819   CALCIUM 9.8 08/27/2017 0915   ALKPHOS 79 04/02/2021 0819   ALKPHOS 83 08/27/2017 0915   AST 18 04/02/2021 0819   AST 23 08/27/2017 0915   ALT 14  04/02/2021 0819   ALT 17 08/27/2017 0915   BILITOT 0.7 04/02/2021 0819   BILITOT 0.95 08/27/2017 0915          Results for Tony [name] (MRN [id number]) as of 04/02/2021 09:13  Ref. Range 01/01/2021 09:20  Prostate Specific Ag, Serum Latest Ref Range: 0.0 - 4.0 ng/mL <0.1       Impression and Plan:   80 year old man with:  1.  Castration-sensitive advanced prostate cancer with lymphadenopathy diagnosed in 2017.    He is currently on Zytiga without any major complaints.  Risks and benefits of continuing this treatment were discussed at this time.  Complications that include hypertension, edema, and others were reviewed.  Alternative treatment options such as systemic chemotherapy will be deferred at this time.   3. Hypokalemia: His potassium is within normal range at this time we will continue to monitor.  4. Hypertension: No issues reported with his blood pressure and will monitor on Zytiga.  5.  Prognosis and goals of care: Therapy remains palliative although aggressive measures are warranted given his excellent performance status.  6.  Androgen deprivation therapy: I recommended continuing Eligard indefinitely.  Complication clued osteoporosis and weight gain were reiterated.   7. Follow-up: In 3 months for repeat follow-up.  30  minutes were spent on this visit.  The time was dedicated to reviewing laboratory data, disease status update and outlining treatment options for the future.  Zola Button, MD 7/20/20229:11 AM

## 2021-04-02 NOTE — Patient Instructions (Signed)
Leuprolide depot injection What is this medication? LEUPROLIDE (loo PROE lide) is a man-made protein that acts like a natural hormone in the body. It decreases testosterone in men and decreases estrogen in women. In men, this medicine is used to treat advanced prostate cancer. In women, some forms of this medicine may be used to treat endometriosis, uterinefibroids, or other male hormone-related problems. This medicine may be used for other purposes; ask your health care provider orpharmacist if you have questions. COMMON BRAND NAME(S): Eligard, Fensolv, Lupron Depot, Lupron Depot-Ped, Viadur What should I tell my care team before I take this medication? They need to know if you have any of these conditions: diabetes heart disease or previous heart attack high blood pressure high cholesterol mental illness osteoporosis pain or difficulty passing urine seizures spinal cord metastasis stroke suicidal thoughts, plans, or attempt; a previous suicide attempt by you or a family member tobacco smoker unusual vaginal bleeding (women) an unusual or allergic reaction to leuprolide, benzyl alcohol, other medicines, foods, dyes, or preservatives pregnant or trying to get pregnant breast-feeding How should I use this medication? This medicine is for injection into a muscle or for injection under the skin. It is given by a health care professional in a hospital or clinic setting. The specific product will determine how it will be given to you. Make sure youunderstand which product you receive and how often you will receive it. Talk to your pediatrician regarding the use of this medicine in children.Special care may be needed. Overdosage: If you think you have taken too much of this medicine contact apoison control center or emergency room at once. NOTE: This medicine is only for you. Do not share this medicine with others. What if I miss a dose? It is important not to miss a dose. Call your doctor  or health careprofessional if you are unable to keep an appointment. Depot injections: Depot injections are given either once-monthly, every 12 weeks, every 16 weeks, or every 24 weeks depending on the product you are prescribed. The product you are prescribed will be based on if you are male orfemale, and your condition. Make sure you understand your product and dosing. What may interact with this medication? Do not take this medicine with any of the following medications: chasteberry cisapride dronedarone pimozide thioridazine This medicine may also interact with the following medications: herbal or dietary supplements, like black cohosh or DHEA male hormones, like estrogens or progestins and birth control pills, patches, rings, or injections male hormones, like testosterone other medicines that prolong the QT interval (abnormal heart rhythm) This list may not describe all possible interactions. Give your health care provider a list of all the medicines, herbs, non-prescription drugs, or dietary supplements you use. Also tell them if you smoke, drink alcohol, or use illegaldrugs. Some items may interact with your medicine. What should I watch for while using this medication? Visit your doctor or health care professional for regular checks on your progress. During the first weeks of treatment, your symptoms may get worse, but then will improve as you continue your treatment. You may get hot flashes, increased bone pain, increased difficulty passing urine, or an aggravation of nerve symptoms. Discuss these effects with your doctor or health careprofessional, some of them may improve with continued use of this medicine. Male patients may experience a menstrual cycle or spotting during the first months of therapy with this medicine. If this continues, contact your doctor orhealth care professional. This medicine may increase blood sugar. Ask  your healthcare provider if changesin diet or medicines  are needed if you have diabetes. What side effects may I notice from receiving this medication? Side effects that you should report to your doctor or health care professionalas soon as possible: allergic reactions like skin rash, itching or hives, swelling of the face, lips, or tongue breathing problems chest pain depression or memory disorders pain in your legs or groin pain at site where injected or implanted seizures severe headache signs and symptoms of high blood sugar such as being more thirsty or hungry or having to urinate more than normal. You may also feel very tired or have blurry vision swelling of the feet and legs suicidal thoughts or other mood changes visual changes vomiting Side effects that usually do not require medical attention (report to yourdoctor or health care professional if they continue or are bothersome): breast swelling or tenderness decrease in sex drive or performance diarrhea hot flashes loss of appetite muscle, joint, or bone pains nausea redness or irritation at site where injected or implanted skin problems or acne This list may not describe all possible side effects. Call your doctor for medical advice about side effects. You may report side effects to FDA at1-800-FDA-1088. Where should I keep my medication? This drug is given in a hospital or clinic and will not be stored at home. NOTE: This sheet is a summary. It may not cover all possible information. If you have questions about this medicine, talk to your doctor, pharmacist, orhealth care provider.  2022 Elsevier/Gold Standard (2019-08-02 10:35:13)

## 2021-04-03 ENCOUNTER — Telehealth: Payer: Self-pay | Admitting: *Deleted

## 2021-04-03 LAB — PROSTATE-SPECIFIC AG, SERUM (LABCORP): Prostate Specific Ag, Serum: <0.1 ng/mL (ref 0.0–4.0)

## 2021-04-03 NOTE — Telephone Encounter (Signed)
PC to patient per MD request, wife informed his PSA is <0.1   She verbalizes understanding.

## 2021-04-03 NOTE — Telephone Encounter (Signed)
-----   Message from Wyatt Portela, MD sent at 04/03/2021 11:55 AM EDT ----- Please let him know his PSA is still low

## 2021-04-04 NOTE — Progress Notes (Signed)
Remote pacemaker transmission.   

## 2021-04-06 DIAGNOSIS — R269 Unspecified abnormalities of gait and mobility: Secondary | ICD-10-CM | POA: Diagnosis not present

## 2021-04-06 DIAGNOSIS — G4733 Obstructive sleep apnea (adult) (pediatric): Secondary | ICD-10-CM | POA: Diagnosis not present

## 2021-04-14 DIAGNOSIS — R269 Unspecified abnormalities of gait and mobility: Secondary | ICD-10-CM | POA: Diagnosis not present

## 2021-04-14 DIAGNOSIS — G4733 Obstructive sleep apnea (adult) (pediatric): Secondary | ICD-10-CM | POA: Diagnosis not present

## 2021-04-15 NOTE — Progress Notes (Signed)
HPI M former smoker followed for OSA, COPD, hx Respiratory Failure, dCHF w pulmonary edema, hx E.Coli Bacteremia, CAD, Chronic AFib/ Pacemaker, Prostate Cancer, Aortic Aneurysm HST 09/02/20- AHI 40.8/ hr, desaturation to 66%/ avbg 88%, body weight 228 lbs Pending CPAP titration w O2 check on 2/22 PFT 09/09/20- Minimal obstruction CPAP titration 11/05/20- to BIPAP 25/21 and required O2 3l ======================================================================    10/29/20- 33 yoM former smoker followed for OSA, COPD, hx Respiratory Failure, dCHF w pulmonary edema, hx E.Coli Bacteremia, Chronic AFib/ Pacemaker, Prostate Cancer, Aortic Aneurysm -Advair 100, Ventolin hfa,   Prednisone 5 mg daily(Heme/Onc) HST 09/02/20- AHI 40.8/ hr, desaturation to 66%/ avbg 88%, body weight 228 lbs Pending CPAP titration w O2 check on 2/22 PFT 09/09/20- Minimal obstruction, no resp to BD, Min restriction, Moderate DLCO deficit F/F 0.76, TLC 76%, DLCO 61% HRCT chest 10/24/20-                                                     Wife here IMPRESSION: 1. Moderate centrilobular emphysema. 2. Minimal scarring of the dependent lung bases without specific findings to suggest fibrotic interstitial lung disease. 3. Cardiomegaly and coronary artery disease. 4. Enlargement of the tubular ascending thoracic aorta, measuring up to 4.9 x 4.8 cm. Ascending thoracic aortic aneurysm. Recommend semi-annual imaging followup by CTA or MRA and referral to cardiothoracic surgery if not already obtained. This recommendation follows 2010 ACCF/AHA/AATS/ACR/ASA/SCA/SCAI/SIR/STS/SVM Guidelines for the Diagnosis and Management of Patients With Thoracic Aortic Disease. Circulation. 2010; 121ML:4928372. Aortic aneurysm NOS (ICD10-I71.9)  Aortic Atherosclerosis (ICD10-I70.0) and Emphysema (ICD10-J43.9). Body weight today-222 lbs Ccovid vax-3 Phizer Flu vax-had  We reviewed sleep study, noting sustained hypoxemia as reason we need in-center  CPAP titration, in case he needs O2. Study is pending this month. We reviewed PFT. Little obstructive airways disease, but emphysema seen on CT likely corresponds to his reduced DLCO. His cardiologist follows CAD and Aortic Aneurysm.  04/16/21- 51 yoM former smoker followed for OSA, COPD, hx Respiratory Failure, dCHF w pulmonary edema, hx E.Coli Bacteremia, Chronic AFib/ Pacemaker, Prostate Cancer, Aortic Aneurysms. Celiac Stenosis, CAD,  -Wixela 100, Proair hfa   Prednisone 5 mg daily(Heme/Onc) CPAP titration 11/05/20- to BIPAP 25/21 and required O2 3l BIPAP 25/21, PS 2, with O2 3L for sleep/ [facility] Aortic aneurysms followed by Dr Cyndia Bent Body weight today-227 lbs Covid vax-3 PHIZER Bothersome mask leak with full face mask.  ST card is blank at this visit.  We discussed Inspire as an alternative therapy. He has not needed to use rescue inhaler in some months now. CTa chest 01/29/21- IMPRESSION: 1. Unchanged enlargement of the tubular ascending thoracic aorta, measuring up to 4.7 x 4.6 cm. Unchanged enlargement of the descending thoracic aorta, measuring up to 3.2 x 3.1 cm. Ascending thoracic aortic aneurysm. Recommend semi-annual imaging followup by CTA or MRA and referral to cardiothoracic surgery if not already obtained. This recommendation follows 2010 ACCF/AHA/AATS/ACR/ASA/SCA/SCAI/SIR/STS/SVM Guidelines for the Diagnosis and Management of Patients With Thoracic Aortic Disease. Circulation. 2010; 121ML:4928372. Aortic aneurysm NOS (ICD10-I71.9) 2. 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 Recommend follow-up ultrasound every 2 years not otherwise imaged per above recommendations. This recommendation follows ACR consensus guidelines: White Paper of the ACR Incidental Findings Committee II on Vascular Findings.  J Am Coll Radiol 2013; 10:789-794. 3. There is redemonstrated, long  segment, high-grade narrowing of the origin of the celiac axis, stenotic segment approximately 2.0 cm in length with poststenotic dilatation. 4. Moderate mixed calcific atherosclerosis throughout. 5. Coronary artery disease. 6. Emphysema. Aortic Atherosclerosis (ICD10-I70.0) and Emphysema (ICD10-J43.9).  ROS-see HPI   + = positive Constitutional:    weight loss, night sweats, fevers, chills, fatigue, lassitude. HEENT:    headaches, difficulty swallowing, tooth/dental problems, sore throat,       sneezing, itching, ear ache, nasal congestion, post nasal drip, snoring CV:    chest pain, orthopnea, PND, swelling in lower extremities, anasarca,                                   dizziness, +palpitations Resp:   +shortness of breath with exertion or at rest.                productive cough,   non-productive cough, coughing up of blood.              change in color of mucus.  wheezing.   Skin:    rash or lesions. GI:  + heartburn, indigestion, abdominal pain, nausea, vomiting, diarrhea,                 change in bowel habits, loss of appetite GU: dysuria, change in color of urine, no urgency or frequency.   flank pain. MS:   joint pain, stiffness, decreased range of motion, back pain. Neuro-     nothing unusual Psych:  change in mood or affect.  depression or +anxiety.   memory loss.  OBJ- Physical Exam General- Alert, Oriented, Affect-appropriate, Distress- none acute, + obese Skin- rash-none, lesions- none, excoriation- none Lymphadenopathy- none Head- atraumatic            Eyes- Gross vision intact, PERRLA, conjunctivae and secretions clear            Ears- Hearing, canals-normal            Nose- Clear, no-Septal dev, mucus, polyps, erosion, perforation             Throat- Mallampati IV , mucosa clear , drainage- none, tonsils- atrophic, + teeth Neck- flexible , trachea midline, no stridor , thyroid nl, carotid no bruit Chest - symmetrical excursion , unlabored           Heart/CV- RRR  , no murmur , no gallop  , no rub, nl s1 s2                           - JVD- none , edema+trace, stasis changes- none, varices- none           Lung- +clear, wheeze- none, cough- none , dullness-none, rub- none           Chest wall- +pacemaker L  Abd-  Br/ Gen/ Rectal- Not done, not indicated Extrem- cyanosis- none, clubbing, none, atrophy- none, strength- nl Neuro- grossly intact to observation

## 2021-04-16 ENCOUNTER — Other Ambulatory Visit: Payer: Self-pay

## 2021-04-16 ENCOUNTER — Ambulatory Visit (INDEPENDENT_AMBULATORY_CARE_PROVIDER_SITE_OTHER): Payer: Medicare Other | Admitting: Internal Medicine

## 2021-04-16 ENCOUNTER — Encounter: Payer: Self-pay | Admitting: Internal Medicine

## 2021-04-16 DIAGNOSIS — G4733 Obstructive sleep apnea (adult) (pediatric): Secondary | ICD-10-CM | POA: Diagnosis not present

## 2021-04-16 DIAGNOSIS — J439 Emphysema, unspecified: Secondary | ICD-10-CM | POA: Diagnosis not present

## 2021-04-16 DIAGNOSIS — J449 Chronic obstructive pulmonary disease, unspecified: Secondary | ICD-10-CM

## 2021-04-16 MED ORDER — BUDESONIDE-FORMOTEROL FUMARATE 160-4.5 MCG/ACT IN AERO: INHALATION_SPRAY | RESPIRATORY_TRACT | 12 refills | Status: DC

## 2021-04-16 MED ORDER — ALBUTEROL SULFATE HFA 108 (90 BASE) MCG/ACT IN AERS: INHALATION_SPRAY | RESPIRATORY_TRACT | 12 refills | Status: DC

## 2021-04-16 NOTE — Patient Instructions (Addendum)
Order- DME [facility]- continue BIPAP 25/21 with O2 3L for sleep            Please install AirView or check to make sure card is installed and reading correctly  Order- referral to [place] for mask fitting   You can turn up your humidifier a bit to help with dryness  Please let us know if your BIPAP isn't comfortable so we can help  Prescription sent for refill of your albuterol rescue inhaler   Prescription sent  for Symbicort maintenance inhaler. This is similar to your Wixela inhaler. Get whichever is cheapest. Don't use them both.

## 2021-04-17 ENCOUNTER — Other Ambulatory Visit: Payer: Self-pay | Admitting: Family Medicine

## 2021-04-17 ENCOUNTER — Other Ambulatory Visit: Payer: Self-pay | Admitting: Cardiovascular Disease

## 2021-04-23 DIAGNOSIS — M5432 Sciatica, left side: Secondary | ICD-10-CM | POA: Diagnosis not present

## 2021-04-24 ENCOUNTER — Telehealth: Payer: Self-pay | Admitting: Pharmacist

## 2021-04-24 NOTE — Chronic Care Management (AMB) (Signed)
Chronic Care Management Pharmacy Assistant   Name: Tony [name]  MRN: [id number] DOB: 04-Nov-1940  Reason for Encounter: Disease State/ Hypertension Assessment Call.    Conditions to be addressed/monitored: HTN  Recent office visits:  None.  Recent consult visits:  04/16/21 Baird Lyons MD (Pulmonary Disease) - seen for pulmonary emphysema. Referral placed for DME. Patient started on Budesonide-Formoterol Fumarate 160-4.56mg inhale 2 puffs then rinse mouth twice daily.   04/02/21 SZola ButtonMD (Oncology) - seen for malignant neoplasm of prostate. No medication changes. Follow up in 3 months.   Hospital visits:  Medication Reconciliation was completed by comparing discharge summary, patient's EMR and Pharmacy list, and upon discussion with patient.  [facility] left sided sciatica and leg pain.   [facility] -started tizanidine '4mg'$  every 8 hours as needed and prednisone '5mg'$  taper pak.  Medication Changes at Hospital Discharge: -Changed None.   Medications Discontinued at Hospital Discharge: -Stopped None.   Medications that remain the same after Hospital Discharge:??  -All other medications will remain the same.    Medications: Outpatient Encounter Medications as of 04/24/2021  Medication Sig Note   abiraterone acetate (ZYTIGA) 250 MG tablet TAKE 4 TABLETS (1,000 MG TOTAL) BY MOUTH DAILY. TAKE ON AN EMPTY STOMACH 1 HOUR BEFORE OR 2 HOURS AFTER A MEAL    acetaminophen (TYLENOL) 500 MG tablet Take 1,000 mg by mouth every 6 (six) hours as needed for headache (pain).     albuterol (VENTOLIN HFA) 108 (90 Base) MCG/ACT inhaler TAKE 2 PUFFS BY MOUTH EVERY 6 HOURS AS NEEDED FOR WHEEZE OR SHORTNESS OF BREATH    amLODipine (NORVASC) 2.5 MG tablet TAKE 1 TABLET BY MOUTH EVERY DAY    Ascorbic Acid (VITAMIN C PO) Take 1 tablet by mouth daily.    aspirin EC 81 MG tablet Take 81 mg by  mouth daily.    atorvastatin (LIPITOR) 20 MG tablet TAKE 1 TABLET BY MOUTH EVERY DAY    budesonide-formoterol (SYMBICORT) 160-4.5 MCG/ACT inhaler Inhale 2 puffs then rinse mouth, twice daily    carbamide peroxide (DEBROX) 6.5 % OTIC solution Place 5 drops into both ears daily as needed (for earwax). 07/19/2020: LF 05/31/20 #90   citalopram (CELEXA) 10 MG tablet TAKE 1 TABLET BY MOUTH EVERY DAY    diclofenac Sodium (VOLTAREN) 1 % GEL AAPLY 4 GRAMS TOPICALLY 4 TIMES DAILY    docusate sodium (COLACE) 100 MG capsule Take 100 mg by mouth daily with supper. 07/30/2020: with [place], Tony [name], [place] prn- "usually" once a day   fluticasone (FLONASE) 50 MCG/ACT nasal spray PLACE 1 SPRAY INTO BOTH NOSTRILS DAILY AS NEEDED FOR ALLERGIES OR RHINITIS (AT BEDTIME). (Patient taking differently: Place 1 spray into both nostrils at bedtime as needed for allergies or rhinitis.)    Fluticasone-Salmeterol (WIXELA INHUB) 250-50 MCG/DOSE AEPB Inhale 1 puff into the lungs in the morning and at bedtime.    furosemide (LASIX) 20 MG tablet TAKE 1 TABLET BY MOUITH DAILY FOR 5 DAYS , THEN DAILY AS NEEDED FOR EDEMA    LORazepam (ATIVAN) 1 MG tablet TAKE 1 TABLET BY MOUTH TWICE A DAY AS NEEDED    metoprolol tartrate (LOPRESSOR) 25 MG tablet TAKE 1 TABLET BY MOUTH TWICE A DAY    Multiple Vitamin (MULTIVITAMIN WITH MINERALS) TABS tablet Take 0.5 tablets by mouth every other day. Centrum    omeprazole (PRILOSEC) 20 MG capsule TAKE 1 CAPSULE (20  MG TOTAL) BY MOUTH DAILY BEFORE BREAKFAST.    predniSONE (DELTASONE) 5 MG tablet TAKE 1 TABLET BY MOUTH EVERY DAY WITH BREAKFAST    Tetrahydrozoline HCl (VISINE OP) Place 1 drop into both eyes daily as needed (red eyes).    No facility-administered encounter medications on file as of 04/24/2021.   Fill History: ATORVASTATIN 20 MG TABLET 03/13/2021 90   CITALOPRAM HBR 10 MG TABLET 02/05/2021 90   DICLOFENAC SODIUM 1% GEL 02/14/2021 25   WIXELA 250-50 INHUB 01/12/2021 30    FUROSEMIDE 20 MG TABLET 03/07/2021 90   LORAZEPAM 1 MG TABLET 03/30/2021 30   METOPROLOL TARTRATE 25 MG TAB 02/11/2021 90   OMEPRAZOLE DR 20 MG CAPSULE 03/13/2021 90   AMLODIPINE BESYLATE 2.5 MG TAB 03/30/2021 90   PREDNISONE 5 MG TABLET 02/10/2021 90   Reviewed chart prior to disease state call. Spoke with patient regarding BP  Recent Office Vitals: BP Readings from Last 3 Encounters:  04/16/21 110/82  04/02/21 106/90  01/29/21 100/75   Pulse Readings from Last 3 Encounters:  04/16/21 87  04/02/21 71  01/29/21 86    Wt Readings from Last 3 Encounters:  04/16/21 227 lb 1.3 oz (103 kg)  04/02/21 225 lb 9.6 oz (102.3 kg)  01/29/21 219 lb (99.3 kg)     Kidney Function Lab Results  Component Value Date/Time   CREATININE 0.99 04/02/2021 08:19 AM   CREATININE 1.10 01/01/2021 09:20 AM   CREATININE 1.07 07/31/2020 01:25 PM   CREATININE 0.9 08/27/2017 09:15 AM   CREATININE 0.9 05/27/2017 07:34 AM   GFR 78.21 02/18/2018 09:01 AM   GFRNONAA >60 04/02/2021 08:19 AM   GFRNONAA 66 07/31/2020 01:25 PM   GFRAA 77 07/31/2020 01:25 PM    BMP Latest Ref Rng & Units 04/02/2021 01/01/2021 10/03/2020  Glucose 70 - 99 mg/dL 118(H) 100(H) 114(H)  BUN 8 - 23 mg/dL '18 16 16  '$ Creatinine 0.61 - 1.24 mg/dL 0.99 1.10 1.24  BUN/Creat Ratio 6 - 22 (calc) - - -  Sodium 135 - 145 mmol/L 139 140 140  Potassium 3.5 - 5.1 mmol/L 4.0 4.7 3.9  Chloride 98 - 111 mmol/L 103 103 103  CO2 22 - 32 mmol/L '26 26 25  '$ Calcium 8.9 - 10.3 mg/dL 9.1 9.3 9.3    Current antihypertensive regimen:  Amlodipine 2.5 mg daily  Furosemide 20 mg daily as needed Metoprolol tartrate 25 mg twice daily How often are you checking your Blood Pressure? 1-2x per week Current home BP readings: 119/78 yesterday at the urgent care. What recent interventions/DTPs have been made by any provider to improve Blood Pressure control since last CPP Visit: None. Any recent hospitalizations or ED visits since last visit with CPP?  No  Adherence Review: Is the patient currently on ACE/ARB medication? No Does the patient have >5 day gap between last estimated fill dates? No  Notes: Spoke with patient wife Tony [name] and reviewed all medications as listed and prescribed for patient. Tony [name] stated that patient is taking all medications with no issues except he is not taking Symbicort anymore. Patient checks blood pressure a couple times a week and it was as reported yesterday at the urgent care for most recent reading. Tony [name] stated for breakfast usually has either eggs and biscuits or a bowl of cereal and he always has a cup of coffee. For lunch patient has a sandwich or whatever wife cooks for him which is different stuff. Tony [name] cooks dinner every night unless they have something quick like a  pizza and she usually makes a meat and vegetables. Patient does not eat red meat but rarely and he does not add salt to anything. Patient drinks water all day. Patient loves to eat sweets. He likes grapes or fruit cocktail or a pound cake. For activity patient and wife just signed up for silver sneaker two weeks ago and patient hurt his back after first class so now he is down recovering from that but plans to go back once he's better. Wife thanked me for my call.  Care Gaps:  AWV -  scheduled for 06/25/21 Zoster vaccines - never done  Covid-19 vaccine booster 4 - overdue since 09/18/20 Influenza vaccine - due  Star Rating Drugs:  Atorvastatin '20mg'$  - last filled on 03/13/21 90DS at [facility] Pharmacist Assistant [phone]

## 2021-04-25 ENCOUNTER — Ambulatory Visit: Payer: Medicare Other | Admitting: Adult Health

## 2021-04-28 ENCOUNTER — Emergency Department (HOSPITAL_BASED_OUTPATIENT_CLINIC_OR_DEPARTMENT_OTHER)
Admission: EM | Admit: 2021-04-28 | Discharge: 2021-04-28 | Disposition: A | Discharge status: Home / Self Care | Payer: Medicare Other | Attending: Emergency Medicine | Admitting: Emergency Medicine

## 2021-04-28 ENCOUNTER — Encounter (HOSPITAL_BASED_OUTPATIENT_CLINIC_OR_DEPARTMENT_OTHER): Payer: Self-pay

## 2021-04-28 ENCOUNTER — Emergency Department (HOSPITAL_BASED_OUTPATIENT_CLINIC_OR_DEPARTMENT_OTHER): Payer: Medicare Other

## 2021-04-28 ENCOUNTER — Other Ambulatory Visit: Payer: Self-pay

## 2021-04-28 DIAGNOSIS — I25119 Atherosclerotic heart disease of native coronary artery with unspecified angina pectoris: Secondary | ICD-10-CM | POA: Insufficient documentation

## 2021-04-28 DIAGNOSIS — M79605 Pain in left leg: Secondary | ICD-10-CM | POA: Diagnosis not present

## 2021-04-28 DIAGNOSIS — M5432 Sciatica, left side: Secondary | ICD-10-CM

## 2021-04-28 DIAGNOSIS — M5442 Lumbago with sciatica, left side: Secondary | ICD-10-CM | POA: Diagnosis not present

## 2021-04-28 DIAGNOSIS — M545 Low back pain, unspecified: Secondary | ICD-10-CM | POA: Diagnosis not present

## 2021-04-28 DIAGNOSIS — M25552 Pain in left hip: Secondary | ICD-10-CM | POA: Insufficient documentation

## 2021-04-28 DIAGNOSIS — J449 Chronic obstructive pulmonary disease, unspecified: Secondary | ICD-10-CM | POA: Insufficient documentation

## 2021-04-28 DIAGNOSIS — Z7982 Long term (current) use of aspirin: Secondary | ICD-10-CM | POA: Insufficient documentation

## 2021-04-28 DIAGNOSIS — R0602 Shortness of breath: Secondary | ICD-10-CM | POA: Diagnosis not present

## 2021-04-28 DIAGNOSIS — Z7951 Long term (current) use of inhaled steroids: Secondary | ICD-10-CM | POA: Diagnosis not present

## 2021-04-28 DIAGNOSIS — Z950 Presence of cardiac pacemaker: Secondary | ICD-10-CM | POA: Insufficient documentation

## 2021-04-28 DIAGNOSIS — Z87891 Personal history of nicotine dependence: Secondary | ICD-10-CM | POA: Diagnosis not present

## 2021-04-28 DIAGNOSIS — I10 Essential (primary) hypertension: Secondary | ICD-10-CM | POA: Insufficient documentation

## 2021-04-28 DIAGNOSIS — Z79899 Other long term (current) drug therapy: Secondary | ICD-10-CM | POA: Diagnosis not present

## 2021-04-28 DIAGNOSIS — M79662 Pain in left lower leg: Secondary | ICD-10-CM | POA: Diagnosis present

## 2021-04-28 DIAGNOSIS — Z8546 Personal history of malignant neoplasm of prostate: Secondary | ICD-10-CM | POA: Insufficient documentation

## 2021-04-28 DIAGNOSIS — M79652 Pain in left thigh: Secondary | ICD-10-CM | POA: Diagnosis not present

## 2021-04-28 DIAGNOSIS — M1612 Unilateral primary osteoarthritis, left hip: Secondary | ICD-10-CM | POA: Diagnosis not present

## 2021-04-28 MED ORDER — PREDNISONE 10 MG PO TABS
ORAL_TABLET | ORAL | 0 refills | Status: DC
Start: 2021-04-28 — End: 2021-06-20

## 2021-04-28 NOTE — ED Triage Notes (Signed)
Pt arrives with c/o pain to left leg denies injury. Upon arrival to room pt O2 88%, pt reports using O2 at home to sleep has been more SOB with exertion, wife at bedside reports this has been on going for some time.

## 2021-04-28 NOTE — Discharge Instructions (Addendum)
Call your doctor to help set up an outpatient MRI.  Start the steroids prescribed instead of your chronic steroids and then when you finish go back to your normal steroid dose.  If you develop worsening, recurrent, or continued back pain, numbness or weakness in the legs, incontinence of your bowels or bladders, numbness of your buttocks, fever, abdominal pain, or any other new/concerning symptoms then return to the ER for evaluation.

## 2021-04-28 NOTE — ED Provider Notes (Signed)
[facility] EMERGENCY DEPARTMENT Provider Note   CSN: [id number] Arrival date & time: 04/28/21  0759     History Chief Complaint  Patient presents with   Leg Pain    Tony [name] is a 80 y.o. male.  HPI 80 year old male presents with left leg pain.  Primarily starts in his left buttocks and radiates down.  No injuries.  Pain goes down the posterior aspect of his thigh, sometimes into his proximal calf.  It is an aching pain like a toothache.  About a 6 out of 10.  He has been taking Tylenol.  He is able to walk and has no weakness or numbness.  No fevers, back pain, abdominal pain. No incontinence.  He has chronic dyspnea that is from COPD and has been getting a little worse for past couple months.  He wears oxygen at night.  No new cough, fever, chest pain. No leg swelling.  Past Medical History:  Diagnosis Date   Gastric outlet obstruction 02/03/2018   [name] 02/03/2018   GERD (gastroesophageal reflux disease)    History of hiatal hernia    small/notes 02/03/2018   Hypertension    no meds   Presence of permanent cardiac pacemaker    Prostate cancer ([place]) 02/08/14   Gleason 4+5=9, PSA 15.65   Radiation    Sinus problem    SSS (sick sinus syndrome) [facility])     Patient Active Problem List   Diagnosis Date Noted   OSA (obstructive sleep apnea) 07/25/2020   Senile ecchymosis 05/21/2020   Morbid obesity ([place]) 11/14/2019   Prediabetes 11/14/2019   Varicose veins of both lower extremities 05/17/2019   Bilateral lower extremity edema 03/13/2019   Hypokalemia 03/13/2019   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   SSS (sick sinus syndrome) ([place]) 06/14/2018   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   Coronary artery disease involving native coronary artery of native heart without angina pectoris 06/08/2018   Sick sinus syndrome ([place]) 06/08/2018   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   Acute appendicitis 01/21/2018   Perforated appendicitis 01/21/2018   Chronic fatigue 06/01/2017   Essential hypertension, benign 07/09/2016   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   Coronary artery calcification seen on CAT scan 05/15/2016   Anxiety disorder 04/08/2016   Insomnia 04/08/2016   Atherosclerosis of coronary artery without angina pectoris 04/08/2016   Tobacco use disorder 04/08/2016   Hypercholesterolemia 04/08/2016   Malignant neoplasm of prostate ([place]) 02/23/2014    Past Surgical History:  Procedure Laterality Date   APPENDECTOMY  2019   hx hematoma s/p appendectomy   ESOPHAGOGASTRODUODENOSCOPY (EGD) WITH PROPOFOL N/A 02/03/2018   Procedure: ESOPHAGOGASTRODUODENOSCOPY (EGD) WITH PROPOFOL;  Surgeon: Clarene Essex, MD;  Location: [facility];  Service: Endoscopy;  Laterality: N/A;   FRACTURE SURGERY     INSERT / REPLACE / REMOVE PACEMAKER  06/14/2018   LAPAROSCOPIC APPENDECTOMY N/A 01/21/2018   Procedure: APPENDECTOMY LAPAROSCOPIC;  Surgeon: Kinsinger, Arta Bruce, MD;  Location: [facility];  Service: General;  Laterality: N/A;   PACEMAKER IMPLANT N/A 06/14/2018   Procedure: PACEMAKER IMPLANT - Dual Chamber;  Surgeon: Sanda Klein, MD;  Location: [facility] CV [facility];  Service: Cardiovascular;  Laterality: N/A;   PROSTATE BIOPSY  02/08/14   gleason 4+5=9, 12/12 cores positive, 54 gm   RADIOACTIVE SEED IMPLANT N/A 06/29/2014   Procedure: RADIOACTIVE SEED IMPLANT;  Surgeon: Bernestine Amass, MD;  Location: [facility];  Service: Urology;  Laterality: N/A;   WRIST FRACTURE SURGERY Right 1980s       Family History  Problem Relation Age of Onset   Alzheimer's disease Mother    Alzheimer's disease Father    Cancer Father        prostate   Arthritis Sister    Cancer Brother        prostate    Social History   Tobacco Use   Smoking status: Former    Types: Cigars    Quit date: 09/14/2012    Years since  quitting: 8.6   Smokeless tobacco: Never   Tobacco comments:    little cigars; the small ones"  Vaping Use   Vaping Use: Never used  Substance Use Topics   Alcohol use: Yes    Alcohol/week: 14.0 standard drinks    Types: 14 Cans of beer per week   Drug use: No    Home Medications Prior to Admission medications   Medication Sig Start Date End Date Taking? Authorizing Provider  predniSONE (DELTASONE) 10 MG tablet Take 40 mg x 3 days, 30 mg x 3 days, 20 mg x 3 days, 10 mg x 3 days and then stop 04/28/21  Yes Sherwood Gambler, MD  abiraterone acetate (ZYTIGA) 250 MG tablet TAKE 4 TABLETS (1,000 MG TOTAL) BY MOUTH DAILY. TAKE ON AN EMPTY STOMACH 1 HOUR BEFORE OR 2 HOURS AFTER A MEAL 03/03/21 03/03/22  Wyatt Portela, MD  acetaminophen (TYLENOL) 500 MG tablet Take 1,000 mg by mouth every 6 (six) hours as needed for headache (pain).     [provider]  albuterol (VENTOLIN HFA) 108 (90 Base) MCG/ACT inhaler TAKE 2 PUFFS BY MOUTH EVERY 6 HOURS AS NEEDED FOR WHEEZE OR SHORTNESS OF BREATH 04/16/21   Baird Lyons D, MD  amLODipine (NORVASC) 2.5 MG tablet TAKE 1 TABLET BY MOUTH EVERY DAY 01/27/21   [name], [name], MD  Ascorbic Acid (VITAMIN C PO) Take 1 tablet by mouth daily.    [provider]  aspirin EC 81 MG tablet Take 81 mg by mouth daily.    [provider]  atorvastatin (LIPITOR) 20 MG tablet TAKE 1 TABLET BY MOUTH EVERY DAY 04/17/21   Croitoru, Dani Gobble, MD  budesonide-formoterol (SYMBICORT) 160-4.5 MCG/ACT inhaler Inhale 2 puffs then rinse mouth, twice daily 04/16/21   Baird Lyons D, MD  carbamide peroxide (DEBROX) 6.5 % OTIC solution Place 5 drops into both ears daily as needed (for earwax).    [provider]  citalopram (CELEXA) 10 MG tablet TAKE 1 TABLET BY MOUTH EVERY DAY 01/27/21   [name], [name], MD  diclofenac Sodium (VOLTAREN) 1 % GEL AAPLY 4 GRAMS TOPICALLY 4 TIMES DAILY 02/14/21   [name], [name], MD  docusate sodium (COLACE) 100 MG capsule Take 100 mg  by mouth daily with supper.    [provider]  fluticasone (FLONASE) 50 MCG/ACT nasal spray PLACE 1 SPRAY INTO BOTH NOSTRILS DAILY AS NEEDED FOR ALLERGIES OR RHINITIS (AT BEDTIME). Patient taking differently: Place 1 spray into both nostrils at bedtime as needed for allergies or rhinitis. 09/04/19   [name], [name], MD  Fluticasone-Salmeterol [facility] INHUB) 250-50 MCG/DOSE AEPB Inhale 1 puff into the lungs in the morning and at bedtime. 11/18/20   [name], [name], MD  furosemide (LASIX) 20 MG tablet TAKE 1 TABLET BY MOUITH DAILY FOR 5 DAYS , THEN DAILY AS NEEDED FOR EDEMA 01/27/21   [name], [name], MD  LORazepam (ATIVAN) 1 MG tablet TAKE 1 TABLET BY MOUTH TWICE A DAY AS NEEDED 02/28/21   [name], [name], MD  metoprolol tartrate (LOPRESSOR) 25 MG tablet TAKE 1 TABLET BY MOUTH TWICE A DAY 09/30/20   Croitoru, Mihai, MD  Multiple Vitamin (MULTIVITAMIN WITH MINERALS) TABS tablet Take 0.5 tablets by mouth every other day. Centrum    [provider]  omeprazole (PRILOSEC) 20 MG capsule TAKE 1 CAPSULE (20 MG TOTAL) BY MOUTH DAILY BEFORE BREAKFAST. 04/18/21   [name], [name], MD  predniSONE (DELTASONE) 5 MG tablet TAKE 1 TABLET BY MOUTH EVERY DAY WITH BREAKFAST 02/13/21   Wyatt Portela, MD  Tetrahydrozoline HCl (VISINE OP) Place 1 drop into both eyes daily as needed (red eyes).    [provider]    Allergies    Penicillins  Review of Systems   Review of Systems  Constitutional:  Negative for fever.  Respiratory:  Positive for shortness of breath (chronic, unchanged).   Cardiovascular:  Negative for chest pain and leg swelling.  Musculoskeletal:  Positive for myalgias. Negative for back pain.  Neurological:  Negative for weakness and numbness.  All other systems reviewed and are negative.  Physical Exam Updated Vital Signs BP 114/70 (BP Location: Right Arm)   Pulse 60   Temp 98.6 F (37 C) (Oral)   Resp (!) 22   Ht '5\' 8"'$  (1.727 m)   Wt 101.4 kg   SpO2 95%   BMI 34.00  kg/m   Physical Exam Vitals and nursing note reviewed.  Constitutional:      Appearance: He is well-developed.  HENT:     Head: Normocephalic and atraumatic.     Right Ear: External ear normal.     Left Ear: External ear normal.     Nose: Nose normal.  Eyes:     General:        Right eye: No discharge.        Left eye: No discharge.  Cardiovascular:     Rate and Rhythm: Normal rate and regular rhythm.     Pulses:          Posterior tibial pulses are 2+ on the left side.     Heart sounds: Normal heart sounds.  Pulmonary:     Effort: Pulmonary effort is normal.     Breath sounds: Wheezing (mild, expiratory) present.  Abdominal:     Palpations: Abdomen is soft.     Tenderness: There is no abdominal tenderness.  Musculoskeletal:     Cervical back: Neck supple.     Lumbar back: No tenderness.     Left hip: No deformity or tenderness. Normal range of motion.     Comments: No thigh/calf swelling. No bruising or cellulitis  Skin:    General: Skin is warm and dry.  Neurological:     Mental Status: He is alert.     Comments: 5/5 strength in BLE. Grossly normal sensation  Psychiatric:        Mood and Affect: Mood is not anxious.    ED Results / Procedures / Treatments   Labs (all labs ordered are listed, but only abnormal results are displayed) Labs Reviewed - No data to display  EKG EKG Interpretation  Date/Time:  Monday April 28 2021 08:17:26 EDT Ventricular Rate:  62 PR Interval:  245 QRS Duration: 171 QT Interval:  491 QTC Calculation: 499 R Axis:   -2 Text Interpretation: Sinus rhythm Prolonged PR interval Right bundle branch block Confirmed by Regenia Skeeter,  Jinnifer Montejano [phone]) on 04/28/2021 8:22:36 AM  Radiology DG Lumbar Spine Complete  Result Date: 04/28/2021 CLINICAL DATA:  Low back pain, history of prostate cancer EXAM: LUMBAR SPINE - COMPLETE 4+ VIEW COMPARISON:  CT abdomen/pelvis 01/29/2021 FINDINGS: There are 5 non rib-bearing lumbar type vertebral bodies. There  is dextroscoliosis of the lumbar spine centered at L1-L2. There is grade 1 retrolisthesis of L1 on L2 and L2 on L3, grossly similar to the prior CT. There is mild compression deformity of the L1 and L2 vertebral bodies which may be slightly worse than the CT of 01/29/2021. The remaining vertebral body heights appear preserved. There is intervertebral disc space narrowing with vacuum disc phenomenon at L2-L3. There is advanced facet arthropathy at L4-L5 and L5-S1. There is extensive calcified atherosclerotic plaque throughout the aorta. There is a nonobstructive bowel gas pattern. IMPRESSION: 1. Mild compression deformity of the L1 and L2 vertebral bodies which may be slightly worsened compared to the CT of 01/29/2021. Given the reported history, MRI of the lumbar spine may be considered to evaluate for acute fracture and/or pathologic fracture. 2. Grade 1 retrolisthesis of L1 on L2 and L2 on L3 and dextroscoliosis of the lumbar spine, grossly similar to the prior CT. 3. Facet arthropathy most advanced at L4-L5 and L5-S1. Electronically Signed   By: Valetta Mole M.D.   On: 04/28/2021 10:33   US Venous Img Lower  Left (DVT Study)  Result Date: 04/28/2021 CLINICAL DATA:  Left leg and thigh pain EXAM: LEFT LOWER EXTREMITY VENOUS DOPPLER ULTRASOUND TECHNIQUE: Gray-scale sonography with graded compression, as well as color Doppler and duplex ultrasound were performed to evaluate the lower extremity deep venous systems from the level of the common femoral vein and including the common femoral, femoral, profunda femoral, popliteal and calf veins including the posterior tibial, peroneal and gastrocnemius veins when visible. The superficial great saphenous vein was also interrogated. Spectral Doppler was utilized to evaluate flow at rest and with distal augmentation maneuvers in the common femoral, femoral and popliteal veins. COMPARISON:  None. FINDINGS: Contralateral Common Femoral Vein: Respiratory phasicity is normal  and symmetric with the symptomatic side. No evidence of thrombus. Normal compressibility. Common Femoral Vein: No evidence of thrombus. Normal compressibility, respiratory phasicity and response to augmentation. Saphenofemoral Junction: No evidence of thrombus. Normal compressibility and flow on color Doppler imaging. Profunda Femoral Vein: No evidence of thrombus. Normal compressibility and flow on color Doppler imaging. Femoral Vein: No evidence of thrombus. Normal compressibility, respiratory phasicity and response to augmentation. Popliteal Vein: No evidence of thrombus. Normal compressibility, respiratory phasicity and response to augmentation. Calf Veins: No evidence of thrombus. Normal compressibility and flow on color Doppler imaging. Other Findings:  None. IMPRESSION: No deep venous thrombosis. Electronically Signed   By: Albin Felling M.D.   On: 04/28/2021 09:20   DG Hip Unilat W or Wo Pelvis 2-3 Views Left  Result Date: 04/28/2021 CLINICAL DATA:  Left leg pain and shortness of breath. EXAM: DG HIP (WITH OR WITHOUT PELVIS) 2-3V LEFT COMPARISON:  CT abdomen pelvis dated Jan 29, 2021. FINDINGS: No acute fracture or dislocation. Unchanged mild bilateral hip osteoarthritis. Brachytherapy seeds within the prostate gland again noted. Bilateral femoral atherosclerotic calcification. IMPRESSION: 1. No acute osseous abnormality. 2. Unchanged mild bilateral hip osteoarthritis. Electronically Signed   By: Titus Dubin M.D.   On: 04/28/2021 09:02    Procedures Procedures   Medications Ordered in ED Medications - No data to display  ED Course  I have reviewed the triage  vital signs and the nursing notes.  Pertinent labs & imaging results that were available during my care of the patient were reviewed by me and considered in my medical decision making (see chart for details).    MDM Rules/Calculators/A&P                           Patient symptoms sound like sciatica.  He has no acute neuro  findings.  ECG was obtained from nursing staff because the patient transiently had sats of 88% but has been higher than that ever since my evaluation.  He has chronic dyspnea but no new dyspnea.  Otherwise, x-rays were obtained as well as a DVT ultrasound to rule out other causes of his leg pain.  He does have a history of prostate cancer though this seems to be in remission.  He is chronically on low-dose steroids.  Family would like to try and increase steroids which I think is fairly reasonable with the sciatica sounding symptoms.  An x-ray of his lumbar spine does show chronic compression fracture but no obvious new injuries.  No obvious masses.  He probably needs an outpatient MRI but I do not think he needs it emergently or needs to be transferred.  Will discharge home with return precautions. Final Clinical Impression(s) / ED Diagnoses Final diagnoses:  Left sided sciatica    Rx / DC Orders ED Discharge Orders          Ordered    predniSONE (DELTASONE) 10 MG tablet        04/28/21 1107             Sherwood Gambler, MD 04/28/21 1447

## 2021-05-01 ENCOUNTER — Other Ambulatory Visit: Payer: Self-pay | Admitting: Family Medicine

## 2021-05-01 DIAGNOSIS — J449 Chronic obstructive pulmonary disease, unspecified: Secondary | ICD-10-CM

## 2021-05-07 ENCOUNTER — Ambulatory Visit (HOSPITAL_BASED_OUTPATIENT_CLINIC_OR_DEPARTMENT_OTHER): Payer: Medicare Other | Attending: Internal Medicine | Admitting: Internal Medicine

## 2021-05-07 DIAGNOSIS — R269 Unspecified abnormalities of gait and mobility: Secondary | ICD-10-CM | POA: Diagnosis not present

## 2021-05-07 DIAGNOSIS — G4733 Obstructive sleep apnea (adult) (pediatric): Secondary | ICD-10-CM | POA: Diagnosis not present

## 2021-05-12 ENCOUNTER — Other Ambulatory Visit: Payer: Self-pay

## 2021-05-13 ENCOUNTER — Telehealth: Payer: Self-pay | Admitting: Cardiovascular Disease

## 2021-05-13 DIAGNOSIS — I495 Sick sinus syndrome: Secondary | ICD-10-CM

## 2021-05-13 DIAGNOSIS — I2510 Atherosclerotic heart disease of native coronary artery without angina pectoris: Secondary | ICD-10-CM

## 2021-05-13 NOTE — Telephone Encounter (Signed)
pt is extremely tired and never gets up and moves around.Tony [name] pts wife would like to know if his pacemaker needs to be checked

## 2021-05-13 NOTE — Telephone Encounter (Signed)
The patient's wife stated that the patient has been suffering from a lack of energy for the past month. She stated that he does not get up and do things like he used to. He has been drinking and eating like normal. She stated that the patient denies chest pain, shortness of breath outside his norm, and denies swelling.  She does check his blood pressure regularly but does not keep a log of these. She stated they have been his norm.  She went ahead and did a transmission. She has been advised that we will call back after this has been reviewed. She is requesting a follow up with Dr. Sallyanne Kuster.

## 2021-05-13 NOTE — Telephone Encounter (Signed)
Returned phone call to [name] (patients wife).  There are 3 NSVT events logged, longest in duration 2 seconds. Not a new finding for patient. Advised I will forward to Dr. Sallyanne Kuster for review. Appreciative of follow up call.

## 2021-05-14 NOTE — Telephone Encounter (Signed)
Patient's wife has been made aware. She will bring the patient in tomorrow for lab work. A follow up appointment will be made following lab results.

## 2021-05-15 DIAGNOSIS — I714 Abdominal aortic aneurysm, without rupture: Secondary | ICD-10-CM | POA: Diagnosis not present

## 2021-05-15 DIAGNOSIS — R269 Unspecified abnormalities of gait and mobility: Secondary | ICD-10-CM | POA: Diagnosis not present

## 2021-05-15 DIAGNOSIS — I453 Trifascicular block: Secondary | ICD-10-CM | POA: Diagnosis not present

## 2021-05-15 DIAGNOSIS — G4733 Obstructive sleep apnea (adult) (pediatric): Secondary | ICD-10-CM | POA: Diagnosis not present

## 2021-05-15 DIAGNOSIS — E7800 Pure hypercholesterolemia, unspecified: Secondary | ICD-10-CM | POA: Diagnosis not present

## 2021-05-15 DIAGNOSIS — I2510 Atherosclerotic heart disease of native coronary artery without angina pectoris: Secondary | ICD-10-CM | POA: Diagnosis not present

## 2021-05-15 DIAGNOSIS — I712 Thoracic aortic aneurysm, without rupture: Secondary | ICD-10-CM | POA: Diagnosis not present

## 2021-05-15 DIAGNOSIS — I495 Sick sinus syndrome: Secondary | ICD-10-CM | POA: Diagnosis not present

## 2021-05-15 LAB — CBC
Hematocrit: 42.9 % (ref 37.5–51.0)
Hemoglobin: 14.9 g/dL (ref 13.0–17.7)
MCH: 30.8 pg (ref 26.6–33.0)
MCHC: 34.7 g/dL (ref 31.5–35.7)
MCV: 89 fL (ref 79–97)
Platelets: 196 10*3/uL (ref 150–450)
RBC: 4.83 x10E6/uL (ref 4.14–5.80)
RDW: 11.8 % (ref 11.6–15.4)
WBC: 11.5 10*3/uL — ABNORMAL HIGH (ref 3.4–10.8)

## 2021-05-15 LAB — TSH: TSH: 1.84 u[IU]/mL (ref 0.450–4.500)

## 2021-05-16 ENCOUNTER — Telehealth: Payer: Self-pay

## 2021-05-16 NOTE — Telephone Encounter (Signed)
Wife of pt returned call

## 2021-05-16 NOTE — Telephone Encounter (Signed)
Patient's wife is requesting a call back to go over lab results once they have been reviewed.

## 2021-05-16 NOTE — Telephone Encounter (Signed)
Lab results from 05/15/21 in epic, pending review  Routed to MD/RN

## 2021-05-16 NOTE — Telephone Encounter (Signed)
Patient's wife is aware that we do not have any results for pt, cardiology will call pt once labs have been reviewed.

## 2021-05-19 NOTE — Telephone Encounter (Signed)
Patient notified of results by Harriette Bouillon

## 2021-05-21 ENCOUNTER — Ambulatory Visit: Payer: Medicare Other | Admitting: Family Medicine

## 2021-06-07 DIAGNOSIS — G4733 Obstructive sleep apnea (adult) (pediatric): Secondary | ICD-10-CM | POA: Diagnosis not present

## 2021-06-07 DIAGNOSIS — R269 Unspecified abnormalities of gait and mobility: Secondary | ICD-10-CM | POA: Diagnosis not present

## 2021-06-09 DIAGNOSIS — G4733 Obstructive sleep apnea (adult) (pediatric): Secondary | ICD-10-CM | POA: Diagnosis not present

## 2021-06-13 ENCOUNTER — Telehealth: Payer: Self-pay | Admitting: Pharmacist

## 2021-06-13 NOTE — Chronic Care Management (AMB) (Signed)
Chronic Care Management Pharmacy Assistant   Name: Tony [name]  MRN: [id number] DOB: 02/22/1941  Reason for Encounter: Follow up on Symbicort patient assistance.  04/16/21 visit with Mardene Sayer (pulmonary disease) it states Prescription sent  for Symbicort maintenance inhaler. This is similar to your Wixela inhaler. Get whichever is cheapest. Don't use them both.  Spoke with patients wife Tony [name], she states the Symbicort has been approved and he received his first inhaler this past week.  He as discontinued using Wixela. Patient and his wife understands not to take both at the same time.   Medications: Outpatient Encounter Medications as of 06/13/2021  Medication Sig Note   abiraterone acetate (ZYTIGA) 250 MG tablet TAKE 4 TABLETS (1,000 MG TOTAL) BY MOUTH DAILY. TAKE ON AN EMPTY STOMACH 1 HOUR BEFORE OR 2 HOURS AFTER A MEAL    acetaminophen (TYLENOL) 500 MG tablet Take 1,000 mg by mouth every 6 (six) hours as needed for headache (pain).     albuterol (VENTOLIN HFA) 108 (90 Base) MCG/ACT inhaler TAKE 2 PUFFS BY MOUTH EVERY 6 HOURS AS NEEDED FOR WHEEZE OR SHORTNESS OF BREATH    amLODipine (NORVASC) 2.5 MG tablet TAKE 1 TABLET BY MOUTH EVERY DAY    Ascorbic Acid (VITAMIN C PO) Take 1 tablet by mouth daily.    aspirin EC 81 MG tablet Take 81 mg by mouth daily.    atorvastatin (LIPITOR) 20 MG tablet TAKE 1 TABLET BY MOUTH EVERY DAY    budesonide-formoterol (SYMBICORT) 160-4.5 MCG/ACT inhaler Inhale 2 puffs then rinse mouth, twice daily    carbamide peroxide (DEBROX) 6.5 % OTIC solution Place 5 drops into both ears daily as needed (for earwax). 07/19/2020: LF 05/31/20 #90   citalopram (CELEXA) 10 MG tablet TAKE 1 TABLET BY MOUTH EVERY DAY    diclofenac Sodium (VOLTAREN) 1 % GEL AAPLY 4 GRAMS TOPICALLY 4 TIMES DAILY    docusate sodium (COLACE) 100 MG capsule Take 100 mg by mouth daily with supper. 07/30/2020: with wife, Tony [name], on [place], takes prn- "usually" once a day    fluticasone (FLONASE) 50 MCG/ACT nasal spray PLACE 1 SPRAY INTO BOTH NOSTRILS DAILY AS NEEDED FOR ALLERGIES OR RHINITIS (AT BEDTIME). (Patient taking differently: Place 1 spray into both nostrils at bedtime as needed for allergies or rhinitis.)    furosemide (LASIX) 20 MG tablet TAKE 1 TABLET BY MOUITH DAILY FOR 5 DAYS , THEN DAILY AS NEEDED FOR EDEMA    LORazepam (ATIVAN) 1 MG tablet TAKE 1 TABLET BY MOUTH TWICE A DAY AS NEEDED    metoprolol tartrate (LOPRESSOR) 25 MG tablet TAKE 1 TABLET BY MOUTH TWICE A DAY    Multiple Vitamin (MULTIVITAMIN WITH MINERALS) TABS tablet Take 0.5 tablets by mouth every other day. Centrum    omeprazole (PRILOSEC) 20 MG capsule TAKE 1 CAPSULE (20 MG TOTAL) BY MOUTH DAILY BEFORE BREAKFAST.    predniSONE (DELTASONE) 10 MG tablet Take 40 mg x 3 days, 30 mg x 3 days, 20 mg x 3 days, 10 mg x 3 days and then stop    predniSONE (DELTASONE) 5 MG tablet TAKE 1 TABLET BY MOUTH EVERY DAY WITH BREAKFAST    Tetrahydrozoline HCl (VISINE OP) Place 1 drop into both eyes daily as needed (red eyes).    WIXELA INHUB 250-50 MCG/ACT AEPB INHALE 1 PUFF INTO THE LUNGS IN THE MORNING AND AT BEDTIME.    No facility-administered encounter medications on file as of 06/13/2021.    Care Gaps: AWV -  scheduled  for 06/25/21 Zoster vaccines - never done Covid-19 vaccine booster 4 - overdue since 09/18/20 Influenza vaccine - due  Star Rating Drugs: Atorvastatin 20mg  - last filled on 05/28/21 90DS at [facility] [phone]

## 2021-06-14 DIAGNOSIS — R269 Unspecified abnormalities of gait and mobility: Secondary | ICD-10-CM | POA: Diagnosis not present

## 2021-06-14 DIAGNOSIS — G4733 Obstructive sleep apnea (adult) (pediatric): Secondary | ICD-10-CM | POA: Diagnosis not present

## 2021-06-15 NOTE — Addendum Note (Signed)
Addended by: Viona Gilmore on: 06/15/2021 06:18 PM   Modules accepted: Orders

## 2021-06-17 ENCOUNTER — Ambulatory Visit (INDEPENDENT_AMBULATORY_CARE_PROVIDER_SITE_OTHER): Payer: Medicare Other

## 2021-06-17 DIAGNOSIS — I495 Sick sinus syndrome: Secondary | ICD-10-CM

## 2021-06-17 LAB — CUP PACEART REMOTE DEVICE CHECK
Battery Remaining Longevity: 131 mo
Battery Voltage: 3.02 V
Brady Statistic AP VP Percent: 0.07 %
Brady Statistic AP VS Percent: 77.58 %
Brady Statistic AS VP Percent: 0.01 %
Brady Statistic AS VS Percent: 22.34 %
Brady Statistic RA Percent Paced: 77.3 %
Brady Statistic RV Percent Paced: 0.08 %
Date Time Interrogation Session: 20221004025459
Implantable Lead Implant Date: 20191001
Implantable Lead Implant Date: 20191001
Implantable Lead Location: 753859
Implantable Lead Location: 753860
Implantable Lead Model: 5076
Implantable Lead Model: 5076
Implantable Pulse Generator Implant Date: 20191001
Lead Channel Impedance Value: 304 Ohm
Lead Channel Impedance Value: 304 Ohm
Lead Channel Impedance Value: 456 Ohm
Lead Channel Impedance Value: 475 Ohm
Lead Channel Pacing Threshold Amplitude: 0.5 V
Lead Channel Pacing Threshold Amplitude: 0.75 V
Lead Channel Pacing Threshold Pulse Width: 0.4 ms
Lead Channel Pacing Threshold Pulse Width: 0.4 ms
Lead Channel Sensing Intrinsic Amplitude: 2.25 mV
Lead Channel Sensing Intrinsic Amplitude: 2.25 mV
Lead Channel Sensing Intrinsic Amplitude: 2.5 mV
Lead Channel Sensing Intrinsic Amplitude: 2.5 mV
Lead Channel Setting Pacing Amplitude: 1.5 V
Lead Channel Setting Pacing Amplitude: 2.5 V
Lead Channel Setting Pacing Pulse Width: 0.4 ms
Lead Channel Setting Sensing Sensitivity: 0.9 mV

## 2021-06-19 NOTE — Progress Notes (Addendum)
HPI: [name] is a 80 y.o. male, who is here today with his wife for 6 months follow up.   He was last seen on 11/18/20. Since his last visit he was evaluated in the ED, 04/28/21, left-sided sciatic pain.  Hypertension:  Medications:Metoprolol Tartrate 25 mg bid and Amlodipine 2.5 mg daily. BP readings at home:Not checking. Side effects:None.  Negative for unusual or severe headache, visual changes, exertional chest pain, dyspnea, focal weakness. LE edema getting worse for the past month, at the end of the day. No orthopnea or PND. He is wearing compression stocking as needed and taking Furosemide 20 mg daily.  Lab Results  Component Value Date   CREATININE 0.99 04/02/2021   BUN 18 04/02/2021   NA 139 04/02/2021   K 4.0 04/02/2021   CL 103 04/02/2021   CO2 26 04/02/2021   Insomnia and anxiety: On Celexa 10 mg daily and Lorazepam 1 mg bid. His wife thinks medications are still helping. No side effects reported. Sleep also affected by nocturia.  Lorazepam last filled on 05/29/21.  Prediabetes: On Chronic prednisone. Negative for polydipsia,polyuria, or polyphagia.  Lab Results  Component Value Date   HGBA1C 5.9 (H) 07/19/2020   Thoracic aortic atherosclerosis seen on imaging, 01/29/21 chest WRU:[id number] mixed calcific atherosclerosis. Abdominal CT also showed abdominal aortic atherosclerosis. He is on Atorvastatin 20 mg daily.  Lab Results  Component Value Date   CHOL 139 05/21/2020   HDL 51 05/21/2020   LDLCALC 71 05/21/2020   TRIG 89 05/21/2020   CHOLHDL 2.7 05/21/2020   He is also requesting prescription for Benzonatate 200 mg, which he has taken to help with cough. Episodes of coughing spells. He has not identified exacerbating or alleviating factors.  Non productive. Associated epiphora. No associated DOE or wheezing. OTC antihistaminics and intranasal Flonase have not helped. No heartburn. He is on daily PPI, Omeprazole. COPD and OSA  ,follows with pulmonologist.  Review of Systems  Constitutional:  Positive for fatigue. Negative for activity change, appetite change and fever.  Gastrointestinal:  Negative for abdominal pain, nausea and vomiting.  Genitourinary:  Negative for decreased urine volume and hematuria.  Musculoskeletal:  Positive for arthralgias and gait problem.  Neurological:  Negative for syncope and facial asymmetry.  Psychiatric/Behavioral:  Positive for sleep disturbance. Negative for confusion. The patient is nervous/anxious.   Rest of ROS, see pertinent positives sand negatives in HPI  Current Outpatient Medications on File Prior to Visit  Medication Sig Dispense Refill   abiraterone acetate (ZYTIGA) 250 MG tablet TAKE 4 TABLETS (1,000 MG TOTAL) BY MOUTH DAILY. TAKE ON AN EMPTY STOMACH 1 HOUR BEFORE OR 2 HOURS AFTER A MEAL 120 tablet 3   acetaminophen (TYLENOL) 500 MG tablet Take 1,000 mg by mouth every 6 (six) hours as needed for headache (pain).      albuterol (VENTOLIN HFA) 108 (90 Base) MCG/ACT inhaler TAKE 2 PUFFS BY MOUTH EVERY 6 HOURS AS NEEDED FOR WHEEZE OR SHORTNESS OF BREATH 18 each 12   amLODipine (NORVASC) 2.5 MG tablet TAKE 1 TABLET BY MOUTH EVERY DAY 90 tablet 2   Ascorbic Acid (VITAMIN C PO) Take 1 tablet by mouth daily.     aspirin EC 81 MG tablet Take 81 mg by mouth daily.     atorvastatin (LIPITOR) 20 MG tablet TAKE 1 TABLET BY MOUTH EVERY DAY 90 tablet 2   budesonide-formoterol (SYMBICORT) 160-4.5 MCG/ACT inhaler Inhale 2 puffs then rinse mouth, twice daily 1 each 12   carbamide  peroxide (DEBROX) 6.5 % OTIC solution Place 5 drops into both ears daily as needed (for earwax).     citalopram (CELEXA) 10 MG tablet TAKE 1 TABLET BY MOUTH EVERY DAY 90 tablet 3   diclofenac Sodium (VOLTAREN) 1 % GEL AAPLY 4 GRAMS TOPICALLY 4 TIMES DAILY 400 g 2   docusate sodium (COLACE) 100 MG capsule Take 100 mg by mouth daily with supper.     fluticasone (FLONASE) 50 MCG/ACT nasal spray PLACE 1 SPRAY INTO  BOTH NOSTRILS DAILY AS NEEDED FOR ALLERGIES OR RHINITIS (AT BEDTIME). (Patient taking differently: Place 1 spray into both nostrils at bedtime as needed for allergies or rhinitis.) 48 mL 1   furosemide (LASIX) 20 MG tablet TAKE 1 TABLET BY MOUITH DAILY FOR 5 DAYS , THEN DAILY AS NEEDED FOR EDEMA 90 tablet 1   LORazepam (ATIVAN) 1 MG tablet TAKE 1 TABLET BY MOUTH TWICE A DAY AS NEEDED 60 tablet 3   metoprolol tartrate (LOPRESSOR) 25 MG tablet TAKE 1 TABLET BY MOUTH TWICE A DAY 180 tablet 2   Multiple Vitamin (MULTIVITAMIN WITH MINERALS) TABS tablet Take 0.5 tablets by mouth every other day. Centrum     omeprazole (PRILOSEC) 20 MG capsule TAKE 1 CAPSULE (20 MG TOTAL) BY MOUTH DAILY BEFORE BREAKFAST. 90 capsule 3   predniSONE (DELTASONE) 5 MG tablet TAKE 1 TABLET BY MOUTH EVERY DAY WITH BREAKFAST 90 tablet 0   Tetrahydrozoline HCl (VISINE OP) Place 1 drop into both eyes daily as needed (red eyes).     No current facility-administered medications on file prior to visit.   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    Social History   Socioeconomic History    Marital status: Married    Spouse name: Not on file   Number of children: 3   Years of education: Not on file   Highest education level: Not on file  Occupational History   Occupation: Freight forwarder    Employer: [employer]    Comment: retired   Occupation: Freight forwarder    Comment: [employer]-[place]  Tobacco Use   Smoking status: Former    Types: Cigars    Quit date: 09/14/2012    Years since quitting: 8.7   Smokeless tobacco: Never   Tobacco comments:    little cigars; the small ones"  Vaping Use   Vaping Use: Never used  Substance and Sexual Activity   Alcohol use: Yes    Alcohol/week: 14.0 standard drinks    Types: 14 Cans of beer per week   Drug use: No   Sexual activity: Not Currently  Other  Topics Concern   Not on file  Social History Narrative   Lives with wife and 2 dogs in one level home; daughter and her family co-habitate.   Has three daughters, all in [place], supportive. Five grandchildren.   Wants to return to doing silver sneakers once gym re-opens.         Social Determinants of Health   Financial Resource Strain: Medium Risk   Difficulty of Paying Living Expenses: Somewhat hard  Food Insecurity: No Food Insecurity   Worried About Charity fundraiser in the Last Year: Never true   Ran Out of Food in the Last Year: Never true  Transportation Needs: No Transportation Needs   Lack of Transportation (Medical): No   Lack of Transportation (Non-Medical): No  Physical Activity: Not on file  Stress: Not on file  Social Connections: Not on file   Vitals:   06/20/21 1024  BP: 130/80  Pulse: 89  Resp: 16  SpO2: 91%   Body mass index is 34.74 kg/m.  Physical Exam Nursing note reviewed.  Constitutional:      General: He is not in acute distress.    Appearance: He is well-developed.  HENT:     Head: Normocephalic and atraumatic.     Mouth/Throat:     Dentition: Has dentures.  Eyes:     Conjunctiva/sclera: Conjunctivae normal.  Cardiovascular:     Rate and  Rhythm: Normal rate and regular rhythm.     Heart sounds: No murmur heard.    Comments: LE's varicose veins. DP pulses present. Pulmonary:     Effort: Pulmonary effort is normal. No respiratory distress.     Breath sounds: Normal breath sounds.  Abdominal:     Palpations: Abdomen is soft. There is no hepatomegaly or mass.     Tenderness: There is no abdominal tenderness.  Musculoskeletal:     Right lower leg: 1+ Pitting Edema present.     Left lower leg: 1+ Pitting Edema present.  Lymphadenopathy:     Cervical: No cervical adenopathy.  Skin:    General: Skin is warm.     Findings: No erythema or rash.  Neurological:     Mental Status: He is alert and oriented to person, place, and time.     Cranial Nerves: No cranial nerve deficit.     Gait: Gait normal.  Psychiatric:     Comments: Well groomed, good eye contact.   ASSESSMENT AND PLAN:  Mr. Nolan Tuazon Brahmbhatt was seen today for 6 months follow-up.  Orders Placed This Encounter  Procedures   Flu Vaccine QUAD High Dose(Fluad)   Hemoglobin A1c   Lipid panel   Brain Natriuretic Peptide   Insomnia, unspecified type Problem is stable. Continue Lorazepam 1 mg at bedtime prn. Good sleep hygiene.  Prediabetes A healthy life style encouraged for diabetes prevention. On chronic Prednisone.  Bilateral lower extremity edema We discussed possible etiologies. ? Vein insufficiency. Recommend wearing compression stockings daily. Continue Furosemide 20 mg daily.  Atherosclerosis of aorta (HCC) Seen on imaging. Continue Atorvastatin 20 mg daily and Aspirin 81 mg daily.  Essential hypertension, benign BP adequately controlled. Continue Metoprolol tartrate 25 mg bid and amlodipine 2.5 mg daily. Low salt diet.  Other specified anxiety disorders Stable. Continue Celexa 10 mg and Lorazepam 1 mg bid prn.  Chronic cough We discussed possible causes, a few of his chronic medical problem can be contributing factors. Benzonatate  200 mg daily prn. Recommend addressing problem with his pulmonologist.  Need for influenza  vaccination -     Flu Vaccine QUAD High Dose(Fluad)  Other orders -     benzonatate (TESSALON) 200 MG capsule; Take 1 capsule (200 mg total) by mouth daily as needed for cough.  Return in about 6 months (around 12/19/2021).   June Vacha G. [name], MD  [facility]. [place] office.

## 2021-06-20 ENCOUNTER — Encounter: Payer: Self-pay | Admitting: Family Medicine

## 2021-06-20 ENCOUNTER — Ambulatory Visit (INDEPENDENT_AMBULATORY_CARE_PROVIDER_SITE_OTHER): Payer: Medicare Other | Admitting: Family Medicine

## 2021-06-20 ENCOUNTER — Other Ambulatory Visit: Payer: Self-pay

## 2021-06-20 VITALS — BP 130/80 | HR 89 | Resp 16 | Ht 68.0 in | Wt 228.5 lb

## 2021-06-20 DIAGNOSIS — R053 Chronic cough: Secondary | ICD-10-CM

## 2021-06-20 DIAGNOSIS — R600 Localized edema: Secondary | ICD-10-CM

## 2021-06-20 DIAGNOSIS — Z23 Encounter for immunization: Secondary | ICD-10-CM | POA: Diagnosis not present

## 2021-06-20 DIAGNOSIS — I700 Atherosclerosis of aorta: Secondary | ICD-10-CM | POA: Diagnosis not present

## 2021-06-20 DIAGNOSIS — R7303 Prediabetes: Secondary | ICD-10-CM | POA: Diagnosis not present

## 2021-06-20 DIAGNOSIS — I10 Essential (primary) hypertension: Secondary | ICD-10-CM

## 2021-06-20 DIAGNOSIS — G4700 Insomnia, unspecified: Secondary | ICD-10-CM | POA: Diagnosis not present

## 2021-06-20 DIAGNOSIS — F418 Other specified anxiety disorders: Secondary | ICD-10-CM

## 2021-06-20 MED ORDER — BENZONATATE 200 MG PO CAPS: 200.0000 mg | ORAL_CAPSULE | Freq: Every day | ORAL | 0 refills | Status: AC | PRN

## 2021-06-20 NOTE — Patient Instructions (Signed)
A few things to remember from today's visit:   Insomnia, unspecified type  Essential hypertension, benign  Prediabetes - Plan: Hemoglobin A1c  Other specified anxiety disorders  Need for influenza vaccination - Plan: Flu Vaccine QUAD High Dose(Fluad)  Atherosclerosis of aorta (HCC) - Plan: Lipid panel  Bilateral lower extremity edema - Plan: Brain Natriuretic Peptide  Chronic cough  If you need refills please call your pharmacy. Do not use My Chart to request refills or for acute issues that need immediate attention. Benzonatate 200 mg daily as needed.  Please address cough with your pulmonologist. Wear compression stocking daily. No changes in rest of meds today  Please be sure medication list is accurate. If a new problem present, please set up appointment sooner than planned today.

## 2021-06-23 ENCOUNTER — Other Ambulatory Visit (INDEPENDENT_AMBULATORY_CARE_PROVIDER_SITE_OTHER): Payer: Medicare Other

## 2021-06-23 ENCOUNTER — Other Ambulatory Visit: Payer: Self-pay

## 2021-06-23 DIAGNOSIS — R7303 Prediabetes: Secondary | ICD-10-CM

## 2021-06-23 DIAGNOSIS — R600 Localized edema: Secondary | ICD-10-CM | POA: Diagnosis not present

## 2021-06-23 DIAGNOSIS — I700 Atherosclerosis of aorta: Secondary | ICD-10-CM

## 2021-06-23 LAB — LIPID PANEL
Cholesterol: 158 mg/dL (ref 0–200)
HDL: 67.7 mg/dL (ref >39.00)
LDL Cholesterol: 77 mg/dL (ref 0–99)
NonHDL: 90.4
Total CHOL/HDL Ratio: 2
Triglycerides: 68 mg/dL (ref 0.0–149.0)
VLDL: 13.6 mg/dL (ref 0.0–40.0)

## 2021-06-23 LAB — HEMOGLOBIN A1C: Hgb A1c MFr Bld: 6.1 % (ref 4.6–6.5)

## 2021-06-23 LAB — BRAIN NATRIURETIC PEPTIDE: Pro B Natriuretic peptide (BNP): 158 pg/mL — ABNORMAL HIGH (ref 0.0–100.0)

## 2021-06-24 ENCOUNTER — Telehealth: Payer: Self-pay | Admitting: Pharmacist

## 2021-06-24 ENCOUNTER — Other Ambulatory Visit: Payer: Self-pay | Admitting: *Deleted

## 2021-06-24 DIAGNOSIS — C61 Malignant neoplasm of prostate: Secondary | ICD-10-CM

## 2021-06-24 MED ORDER — ABIRATERONE ACETATE 250 MG PO TABS: ORAL_TABLET | Freq: Every day | ORAL | 3 refills | Status: DC

## 2021-06-24 NOTE — Chronic Care Management (AMB) (Signed)
Chronic Care Management Pharmacy Assistant   Name: Tony [name]  MRN: [id number] DOB: [birth year]  Reason for Encounter: Disease State / COPD Assessment Call   Conditions to be addressed/monitored: COPD  Recent office visits:  06/20/2021 [name] MD (PCP) - Patient was seen for insomnia and additional issues. Started Benzonatate 200 mg and Prednisone 5 mg daily. Follow up in 6 months.  Recent consult visits:  None  Hospital visits:  None  Medications: Outpatient Encounter Medications as of 06/24/2021  Medication Sig Note   abiraterone acetate (ZYTIGA) 250 MG tablet TAKE 4 TABLETS (1,000 MG TOTAL) BY MOUTH DAILY. TAKE ON AN EMPTY STOMACH 1 HOUR BEFORE OR 2 HOURS AFTER A MEAL    acetaminophen (TYLENOL) 500 MG tablet Take 1,000 mg by mouth every 6 (six) hours as needed for headache (pain).     albuterol (VENTOLIN HFA) 108 (90 Base) MCG/ACT inhaler TAKE 2 PUFFS BY MOUTH EVERY 6 HOURS AS NEEDED FOR WHEEZE OR SHORTNESS OF BREATH    amLODipine (NORVASC) 2.5 MG tablet TAKE 1 TABLET BY MOUTH EVERY DAY    Ascorbic Acid (VITAMIN C PO) Take 1 tablet by mouth daily.    aspirin EC 81 MG tablet Take 81 mg by mouth daily.    atorvastatin (LIPITOR) 20 MG tablet TAKE 1 TABLET BY MOUTH EVERY DAY    benzonatate (TESSALON) 200 MG capsule Take 1 capsule (200 mg total) by mouth daily as needed for cough.    budesonide-formoterol (SYMBICORT) 160-4.5 MCG/ACT inhaler Inhale 2 puffs then rinse mouth, twice daily    carbamide peroxide (DEBROX) 6.5 % OTIC solution Place 5 drops into both ears daily as needed (for earwax). 07/19/2020: LF 05/31/20 #90   citalopram (CELEXA) 10 MG tablet TAKE 1 TABLET BY MOUTH EVERY DAY    diclofenac Sodium (VOLTAREN) 1 % GEL AAPLY 4 GRAMS TOPICALLY 4 TIMES DAILY    docusate sodium (COLACE) 100 MG capsule Take 100 mg by mouth daily with supper. 07/30/2020: with wife, [name], on [place], takes prn- "usually" once a day   fluticasone (FLONASE) 50 MCG/ACT nasal spray  PLACE 1 SPRAY INTO BOTH NOSTRILS DAILY AS NEEDED FOR ALLERGIES OR RHINITIS (AT BEDTIME). (Patient taking differently: Place 1 spray into both nostrils at bedtime as needed for allergies or rhinitis.)    furosemide (LASIX) 20 MG tablet TAKE 1 TABLET BY MOUITH DAILY FOR 5 DAYS , THEN DAILY AS NEEDED FOR EDEMA    LORazepam (ATIVAN) 1 MG tablet TAKE 1 TABLET BY MOUTH TWICE A DAY AS NEEDED    metoprolol tartrate (LOPRESSOR) 25 MG tablet TAKE 1 TABLET BY MOUTH TWICE A DAY    Multiple Vitamin (MULTIVITAMIN WITH MINERALS) TABS tablet Take 0.5 tablets by mouth every other day. Centrum    omeprazole (PRILOSEC) 20 MG capsule TAKE 1 CAPSULE (20 MG TOTAL) BY MOUTH DAILY BEFORE BREAKFAST.    predniSONE (DELTASONE) 5 MG tablet TAKE 1 TABLET BY MOUTH EVERY DAY WITH BREAKFAST    Tetrahydrozoline HCl (VISINE OP) Place 1 drop into both eyes daily as needed (red eyes).    No facility-administered encounter medications on file as of 06/24/2021.   Fill History: ALBUTEROL HFA 90 MCG INHALER 04/16/2021 25   ATORVASTATIN 20 MG TABLET 05/28/2021 90   CITALOPRAM HBR 10 MG TABLET 05/28/2021 90   LORAZEPAM 1 MG TABLET 05/29/2021 30   FUROSEMIDE 20 MG TABLET 06/13/2021 90   OMEPRAZOLE DR 20 MG CAPSULE 05/20/2021 90   AMLODIPINE BESYLATE 2.5 MG TAB 06/06/2021 90  Current COPD regimen:         Ventolin HFA 108 mcg/ACT take 2 puffs every 6 hours prn        Symbicort 160/4.5 mcg/ACT take 2 puffs daily        No flowsheet data found.  Any recent hospitalizations or ED visits since last visit with CPP? No  Reports COPD symptoms, including Increased shortness of breath with coughing spells when eating, he recently saw Dr. [name] and wife states Dr [name] is aware of this as he had a coughing spell during his appointment with her. He has a follow up with Dr. Annamaria Boots on 08/18/2021  What recent interventions/DTPs have been made by any provider to improve breathing since last visit:    Yes, patient was started on  Benzonatate 200mg  daily as needed.  Patient did not pick this up as it was too expensive.  Have you had exacerbation/flare-up since last visit? Yes  What do you do when you are short of breath?  Patient will use his Rescue medication  Respiratory Devices/Equipment Do you have a nebulizer? No  Do you use a Peak Flow Meter? No  Do you use a maintenance inhaler? Yes Symbicort  How often do you forget to use your daily inhaler? Never  Do you use a rescue inhaler? Yes  How often do you use your rescue inhaler?  infrequently  Do you use a spacer with your inhaler? No  Adherence Review: Does the patient have >5 day gap between last estimated fill date for maintenance inhaler medications? No   Care Gaps: AWV -  scheduled for 06/25/21 Zoster vaccines - never done Covid-19 vaccine booster 4 - overdue since 09/18/20 Last BP reading - 130/80 on 06/20/2021  Star Rating Drugs: Atorvastatin 20mg  - last filled on 05/28/21 90DS at [facility] [phone]

## 2021-06-25 ENCOUNTER — Ambulatory Visit: Payer: Medicare Other

## 2021-06-25 ENCOUNTER — Other Ambulatory Visit: Payer: Self-pay

## 2021-06-25 NOTE — Progress Notes (Signed)
Remote pacemaker transmission.   

## 2021-06-26 ENCOUNTER — Other Ambulatory Visit: Payer: Self-pay | Admitting: *Deleted

## 2021-06-26 DIAGNOSIS — C61 Malignant neoplasm of prostate: Secondary | ICD-10-CM

## 2021-06-26 MED ORDER — ABIRATERONE ACETATE 250 MG PO TABS
ORAL_TABLET | Freq: Every day | ORAL | 2 refills | Status: DC
Start: 2021-06-26 — End: 2021-08-29

## 2021-06-26 NOTE — Telephone Encounter (Signed)
Received faxed refill request for Zytiga 250 mg tablet from [facility] Refilled per MD OV note 04/02/2021

## 2021-06-29 ENCOUNTER — Other Ambulatory Visit: Payer: Self-pay | Admitting: Family Medicine

## 2021-06-29 DIAGNOSIS — R600 Localized edema: Secondary | ICD-10-CM

## 2021-06-29 DIAGNOSIS — F418 Other specified anxiety disorders: Secondary | ICD-10-CM

## 2021-07-01 NOTE — Telephone Encounter (Signed)
-   last filled 05/29/21 - last office visit 06/20/21 - next office visit 12/19/21

## 2021-07-02 ENCOUNTER — Other Ambulatory Visit (HOSPITAL_COMMUNITY): Payer: Self-pay

## 2021-07-02 ENCOUNTER — Telehealth: Payer: Self-pay | Admitting: Family Medicine

## 2021-07-02 ENCOUNTER — Encounter: Payer: Self-pay | Admitting: Oncology

## 2021-07-02 ENCOUNTER — Telehealth: Payer: Self-pay

## 2021-07-02 NOTE — Telephone Encounter (Signed)
Pt wife doris is calling and requesting her husband blood work results. Please call doris on home or cell number

## 2021-07-02 NOTE — Telephone Encounter (Signed)
See result note.  

## 2021-07-02 NOTE — Telephone Encounter (Signed)
Oral Oncology Patient Advocate Encounter  Was successful in securing patient a $8000 grant from [employer] to provide copayment coverage for Zytiga.  This will keep the out of pocket expense at $0.     [employer] ID: [id number]  I have spoken with the patient.   The billing information is as follows and has been shared with [place]: [id number] PCN: [id number] Member ID: [id number] Group ID: [id number] Dates of Eligibility: 06/02/21 through 06/01/22  Fund:  [place] Patient [place] Phone [phone] Fax [phone] 07/02/2021 12:06 PM

## 2021-07-04 ENCOUNTER — Inpatient Hospital Stay: Payer: Medicare Other | Admitting: Oncology

## 2021-07-04 ENCOUNTER — Other Ambulatory Visit: Payer: Self-pay

## 2021-07-04 ENCOUNTER — Inpatient Hospital Stay: Payer: Medicare Other

## 2021-07-04 ENCOUNTER — Inpatient Hospital Stay: Payer: Medicare Other | Attending: Oncology

## 2021-07-04 ENCOUNTER — Inpatient Hospital Stay (HOSPITAL_BASED_OUTPATIENT_CLINIC_OR_DEPARTMENT_OTHER): Payer: Medicare Other | Admitting: Oncology

## 2021-07-04 VITALS — BP 124/90 | HR 96 | Temp 97.7°F | Resp 18 | Ht 68.0 in | Wt 225.0 lb

## 2021-07-04 DIAGNOSIS — C61 Malignant neoplasm of prostate: Secondary | ICD-10-CM

## 2021-07-04 DIAGNOSIS — Z5111 Encounter for antineoplastic chemotherapy: Secondary | ICD-10-CM | POA: Insufficient documentation

## 2021-07-04 DIAGNOSIS — I10 Essential (primary) hypertension: Secondary | ICD-10-CM | POA: Insufficient documentation

## 2021-07-04 DIAGNOSIS — E876 Hypokalemia: Secondary | ICD-10-CM | POA: Diagnosis not present

## 2021-07-04 LAB — CBC WITH DIFFERENTIAL (CANCER CENTER ONLY)
Abs Immature Granulocytes: 0.05 10*3/uL (ref 0.00–0.07)
Basophils Absolute: 0.1 10*3/uL (ref 0.0–0.1)
Basophils Relative: 1 %
Eosinophils Absolute: 0.1 10*3/uL (ref 0.0–0.5)
Eosinophils Relative: 1 %
HCT: 44.5 % (ref 39.0–52.0)
Hemoglobin: 14.7 g/dL (ref 13.0–17.0)
Immature Granulocytes: 1 %
Lymphocytes Relative: 10 %
Lymphs Abs: 1 10*3/uL (ref 0.7–4.0)
MCH: 30.8 pg (ref 26.0–34.0)
MCHC: 33 g/dL (ref 30.0–36.0)
MCV: 93.1 fL (ref 80.0–100.0)
Monocytes Absolute: 0.6 10*3/uL (ref 0.1–1.0)
Monocytes Relative: 6 %
Neutro Abs: 7.9 10*3/uL — ABNORMAL HIGH (ref 1.7–7.7)
Neutrophils Relative %: 81 %
Platelet Count: 202 10*3/uL (ref 150–400)
RBC: 4.78 MIL/uL (ref 4.22–5.81)
RDW: 13.6 % (ref 11.5–15.5)
WBC Count: 9.6 10*3/uL (ref 4.0–10.5)
nRBC: 0 % (ref 0.0–0.2)

## 2021-07-04 LAB — CMP (CANCER CENTER ONLY)
ALT: 17 U/L (ref 0–44)
AST: 24 U/L (ref 15–41)
Albumin: 4.3 g/dL (ref 3.5–5.0)
Alkaline Phosphatase: 81 U/L (ref 38–126)
Anion gap: 8 (ref 5–15)
BUN: 21 mg/dL (ref 8–23)
CO2: 27 mmol/L (ref 22–32)
Calcium: 9.2 mg/dL (ref 8.9–10.3)
Chloride: 104 mmol/L (ref 98–111)
Creatinine: 1.08 mg/dL (ref 0.61–1.24)
GFR, Estimated: >60 mL/min (ref >60)
Glucose, Bld: 89 mg/dL (ref 70–99)
Potassium: 3.6 mmol/L (ref 3.5–5.1)
Sodium: 139 mmol/L (ref 135–145)
Total Bilirubin: 1 mg/dL (ref 0.3–1.2)
Total Protein: 7.2 g/dL (ref 6.5–8.1)

## 2021-07-04 MED ORDER — LEUPROLIDE ACETATE (3 MONTH) 22.5 MG ~~LOC~~ KIT
22.5000 mg | PACK | Freq: Once | SUBCUTANEOUS | Status: AC
  Administered 2021-07-04: 22.5 mg via SUBCUTANEOUS
  Filled 2021-07-04: qty 22.5

## 2021-07-04 NOTE — Patient Instructions (Signed)
Leuprolide depot injection What is this medication? LEUPROLIDE (loo PROE lide) is a man-made protein that acts like a natural hormone in the body. It decreases testosterone in men and decreases estrogen in women. In men, this medicine is used to treat advanced prostate cancer. In women, some forms of this medicine may be used to treat endometriosis, uterine fibroids, or other male hormone-related problems. This medicine may be used for other purposes; ask your health care provider or pharmacist if you have questions. COMMON BRAND NAME(S): Eligard, Fensolv, Lupron Depot, Lupron Depot-Ped, Viadur What should I tell my care team before I take this medication? They need to know if you have any of these conditions: diabetes heart disease or previous heart attack high blood pressure high cholesterol mental illness osteoporosis pain or difficulty passing urine seizures spinal cord metastasis stroke suicidal thoughts, plans, or attempt; a previous suicide attempt by you or a family member tobacco smoker unusual vaginal bleeding (women) an unusual or allergic reaction to leuprolide, benzyl alcohol, other medicines, foods, dyes, or preservatives pregnant or trying to get pregnant breast-feeding How should I use this medication? This medicine is for injection into a muscle or for injection under the skin. It is given by a health care professional in a hospital or clinic setting. The specific product will determine how it will be given to you. Make sure you understand which product you receive and how often you will receive it. Talk to your pediatrician regarding the use of this medicine in children. Special care may be needed. Overdosage: If you think you have taken too much of this medicine contact a poison control center or emergency room at once. NOTE: This medicine is only for you. Do not share this medicine with others. What if I miss a dose? It is important not to miss a dose. Call your  doctor or health care professional if you are unable to keep an appointment. Depot injections: Depot injections are given either once-monthly, every 12 weeks, every 16 weeks, or every 24 weeks depending on the product you are prescribed. The product you are prescribed will be based on if you are male or male, and your condition. Make sure you understand your product and dosing. What may interact with this medication? Do not take this medicine with any of the following medications: chasteberry cisapride dronedarone pimozide thioridazine This medicine may also interact with the following medications: herbal or dietary supplements, like black cohosh or DHEA male hormones, like estrogens or progestins and birth control pills, patches, rings, or injections male hormones, like testosterone other medicines that prolong the QT interval (abnormal heart rhythm) This list may not describe all possible interactions. Give your health care provider a list of all the medicines, herbs, non-prescription drugs, or dietary supplements you use. Also tell them if you smoke, drink alcohol, or use illegal drugs. Some items may interact with your medicine. What should I watch for while using this medication? Visit your doctor or health care professional for regular checks on your progress. During the first weeks of treatment, your symptoms may get worse, but then will improve as you continue your treatment. You may get hot flashes, increased bone pain, increased difficulty passing urine, or an aggravation of nerve symptoms. Discuss these effects with your doctor or health care professional, some of them may improve with continued use of this medicine. Male patients may experience a menstrual cycle or spotting during the first months of therapy with this medicine. If this continues, contact your doctor or  health care professional. This medicine may increase blood sugar. Ask your healthcare provider if changes in diet  or medicines are needed if you have diabetes. What side effects may I notice from receiving this medication? Side effects that you should report to your doctor or health care professional as soon as possible: allergic reactions like skin rash, itching or hives, swelling of the face, lips, or tongue breathing problems chest pain depression or memory disorders pain in your legs or groin pain at site where injected or implanted seizures severe headache signs and symptoms of high blood sugar such as being more thirsty or hungry or having to urinate more than normal. You may also feel very tired or have blurry vision swelling of the feet and legs suicidal thoughts or other mood changes visual changes vomiting Side effects that usually do not require medical attention (report to your doctor or health care professional if they continue or are bothersome): breast swelling or tenderness decrease in sex drive or performance diarrhea hot flashes loss of appetite muscle, joint, or bone pains nausea redness or irritation at site where injected or implanted skin problems or acne This list may not describe all possible side effects. Call your doctor for medical advice about side effects. You may report side effects to FDA at 1-800-FDA-1088. Where should I keep my medication? This drug is given in a hospital or clinic and will not be stored at home. NOTE: This sheet is a summary. It may not cover all possible information. If you have questions about this medicine, talk to your doctor, pharmacist, or health care provider.  2022 [employer]vier/Gold Standard (2019-08-02 10:35:13)

## 2021-07-04 NOTE — Progress Notes (Signed)
Hematology and Oncology Follow Up Visit  Tony [name] [id number] [birth year] 80 y.o. 07/04/2021 8:45 AM Tony, Betty [name], [name], Tony So, MD   Principle Diagnosis: 80 year old man with prostate cancer diagnosed in 2015 presented with Gleason score 4+5 = 9 and a PSA 15.7.  He developed castration-sensitive advanced with lymphadenopathy diagnosed in 2017.     Prior Therapy:  He is status post radiation therapy for external beam radiation completed in September 2015 for a total of 45 gray. He also status post seed implant brachytherapy boost completed in October 2015.   He received total of 1 year of androgen deprivation 22,015 and 2016. His PSA nadir was to 0.47 in November 2016. His PSA was 4.08 in May 2017.  Current therapy:   Eligard 22.5 mg every 3 months.  He will receive injection today and repeated in 3 months.  Zytiga 1000 mg with prednisone started in August 2017.   Interim History: Tony [name] returns today for a follow-up visit.  Since the last visit, he reports no major changes in his health.  He denies any recent hospitalizations or illnesses.  He denies any bone pain or pathological fractures.  He denies excessive fatigue, tiredness or worsening edema.  He continues to ambulate without any difficulties.        Medications: Reviewed without changes. Current Outpatient Medications  Medication Sig Dispense Refill   abiraterone acetate (ZYTIGA) 250 MG tablet TAKE 4 TABLETS (1,000 MG TOTAL) BY MOUTH DAILY. TAKE ON AN EMPTY STOMACH 1 HOUR BEFORE OR 2 HOURS AFTER A MEAL 120 tablet 2   acetaminophen (TYLENOL) 500 MG tablet Take 1,000 mg by mouth every 6 (six) hours as needed for headache (pain).      albuterol (VENTOLIN HFA) 108 (90 Base) MCG/ACT inhaler TAKE 2 PUFFS BY MOUTH EVERY 6 HOURS AS NEEDED FOR WHEEZE OR SHORTNESS OF BREATH 18 each 12   amLODipine (NORVASC) 2.5 MG tablet TAKE 1 TABLET BY MOUTH EVERY DAY 90 tablet 2   Ascorbic Acid (VITAMIN C PO) Take 1 tablet  by mouth daily.     aspirin EC 81 MG tablet Take 81 mg by mouth daily.     atorvastatin (LIPITOR) 20 MG tablet TAKE 1 TABLET BY MOUTH EVERY DAY 90 tablet 2   benzonatate (TESSALON) 200 MG capsule Take 1 capsule (200 mg total) by mouth daily as needed for cough. 30 capsule 0   budesonide-formoterol (SYMBICORT) 160-4.5 MCG/ACT inhaler Inhale 2 puffs then rinse mouth, twice daily 1 each 12   carbamide peroxide (DEBROX) 6.5 % OTIC solution Place 5 drops into both ears daily as needed (for earwax).     citalopram (CELEXA) 10 MG tablet TAKE 1 TABLET BY MOUTH EVERY DAY 90 tablet 3   diclofenac Sodium (VOLTAREN) 1 % GEL AAPLY 4 GRAMS TOPICALLY 4 TIMES DAILY 400 [name] 2   docusate sodium (COLACE) 100 MG capsule Take 100 mg by mouth daily with supper.     fluticasone (FLONASE) 50 MCG/ACT nasal spray PLACE 1 SPRAY INTO BOTH NOSTRILS DAILY AS NEEDED FOR ALLERGIES OR RHINITIS (AT BEDTIME). (Patient taking differently: Place 1 spray into both nostrils at bedtime as needed for allergies or rhinitis.) 48 mL 1   furosemide (LASIX) 20 MG tablet TAKE 1 TABLET BY MOUITH DAILY FOR 5 DAYS , THEN DAILY AS NEEDED FOR EDEMA 90 tablet 1   LORazepam (ATIVAN) 1 MG tablet TAKE 1 TABLET BY MOUTH TWICE A DAY AS NEEDED. DNFB 6/18 60 tablet 3   metoprolol tartrate (LOPRESSOR)  25 MG tablet TAKE 1 TABLET BY MOUTH TWICE A DAY 180 tablet 2   Multiple Vitamin (MULTIVITAMIN WITH MINERALS) TABS tablet Take 0.5 tablets by mouth every other day. Centrum     omeprazole (PRILOSEC) 20 MG capsule TAKE 1 CAPSULE (20 MG TOTAL) BY MOUTH DAILY BEFORE BREAKFAST. 90 capsule 3   predniSONE (DELTASONE) 5 MG tablet TAKE 1 TABLET BY MOUTH EVERY DAY WITH BREAKFAST 90 tablet 0   Tetrahydrozoline HCl (VISINE OP) Place 1 drop into both eyes daily as needed (red eyes).     No current facility-administered medications for this visit.     Allergies:  Allergies  Allergen Reactions   Penicillins Rash    Has patient had a PCN reaction causing immediate rash,  facial/tongue/throat swelling, SOB or lightheadedness with hypotension: Yes Has patient had a PCN reaction causing severe rash involving mucus membranes or skin necrosis: No Has patient had a PCN reaction that required hospitalization: No Has patient had a PCN reaction occurring within the last 10 years: No If all of the above answers are "NO", then may proceed with Cephalosporin use.      Physical Exam:      Blood pressure 124/90, pulse 96, temperature 97.7 F (36.5 C), temperature source Tympanic, resp. rate 18, height 5\' 8"  (1.727 m), weight 225 lb (102.1 kg), SpO2 96 %.      ECOG: 1    General appearance: Comfortable appearing without any discomfort Head: Normocephalic without any trauma Oropharynx: Mucous membranes are moist and pink without any thrush or ulcers. Eyes: Pupils are equal and round reactive to light. Lymph nodes: No cervical, supraclavicular, inguinal or axillary lymphadenopathy.   Heart:regular rate and rhythm.  S1 and S2 without leg edema. Lung: Clear without any rhonchi or wheezes.  No dullness to percussion. Abdomin: Soft, nontender, nondistended with good bowel sounds.  No hepatosplenomegaly. Musculoskeletal: No joint deformity or effusion.  Full range of motion noted. Neurological: No deficits noted on motor, sensory and deep tendon reflex exam. Skin: No petechial rash or dryness.  Appeared moist.                Lab Results: Lab Results  Component Value Date   WBC 9.6 07/04/2021   HGB 14.7 07/04/2021   HCT 44.5 07/04/2021   MCV 93.1 07/04/2021   PLT 202 07/04/2021     Chemistry      Component Value Date/Time   NA 139 04/02/2021 0819   NA 139 02/03/2019 0857   NA 141 08/27/2017 0915   K 4.0 04/02/2021 0819   K 4.8 08/27/2017 0915   CL 103 04/02/2021 0819   CO2 26 04/02/2021 0819   CO2 26 08/27/2017 0915   BUN 18 04/02/2021 0819   BUN 13 02/03/2019 0857   BUN 16.2 08/27/2017 0915   CREATININE 0.99 04/02/2021 0819    CREATININE 1.07 07/31/2020 1325   CREATININE 0.9 08/27/2017 0915   GLU 105 01/10/2016 0000      Component Value Date/Time   CALCIUM 9.1 04/02/2021 0819   CALCIUM 9.8 08/27/2017 0915   ALKPHOS 79 04/02/2021 0819   ALKPHOS 83 08/27/2017 0915   AST 18 04/02/2021 0819   AST 23 08/27/2017 0915   ALT 14 04/02/2021 0819   ALT 17 08/27/2017 0915   BILITOT 0.7 04/02/2021 0819   BILITOT 0.95 08/27/2017 0915          Results for OLUWAFERANMI, WAIN (MRN [id number]) as of 07/04/2021 08:45  Ref. Range 01/01/2021 09:20 04/02/2021 08:19  Prostate Specific Ag, Serum Latest Ref Range: 0.0 - 4.0 ng/mL <0.1 <0.1      Impression and Plan:   80 year old man with:  1.  Advanced prostate cancer with lymphadenopathy diagnosed in 2017.  He has castration-sensitive disease.  Risks and benefits of continuing Zytiga moving forward were discussed at this time.  Potential complications that include hypertension, fatigue among others were reiterated.  His PSA continues to be undetectable I have recommended continuing the same dose and schedule.   3. Hypokalemia: His potassium today is within normal range and no supplementation is needed.  4. Hypertension: His blood pressure close to normal range and does not require any intervention.  We will continue to monitor on Zytiga.  5.  Prognosis and goals of care: His disease is incurable although aggressive measures are warranted given his reasonable performance status.  6.  Androgen deprivation therapy: Risks and benefits of continuing Eligard long-term were reviewed.  Weight gain and hot flashes among others were reiterated.   7. Follow-up: In 3 months for repeat follow-up.  30  minutes were dedicated to this encounter.  Time was spent on reviewing laboratory data, disease status update, addressing complications related to cancer and cancer therapy.  Zola Button, MD 10/21/20228:45 AM

## 2021-07-05 LAB — PROSTATE-SPECIFIC AG, SERUM (LABCORP): Prostate Specific Ag, Serum: <0.1 ng/mL (ref 0.0–4.0)

## 2021-07-07 ENCOUNTER — Telehealth: Payer: Self-pay | Admitting: *Deleted

## 2021-07-07 DIAGNOSIS — G4733 Obstructive sleep apnea (adult) (pediatric): Secondary | ICD-10-CM | POA: Diagnosis not present

## 2021-07-07 DIAGNOSIS — R269 Unspecified abnormalities of gait and mobility: Secondary | ICD-10-CM | POA: Diagnosis not present

## 2021-07-07 NOTE — Telephone Encounter (Signed)
Communicated PSA level to patients wife.  No further questions at this time.

## 2021-07-07 NOTE — Telephone Encounter (Signed)
-----   Message from Wyatt Portela, MD sent at 07/07/2021  8:22 AM EDT ----- Please let him know his PSA is still down

## 2021-07-13 ENCOUNTER — Observation Stay (HOSPITAL_BASED_OUTPATIENT_CLINIC_OR_DEPARTMENT_OTHER)
Admission: EM | Admit: 2021-07-13 | Discharge: 2021-07-15 | Disposition: A | Discharge status: Home / Self Care | Payer: Medicare Other | Attending: Internal Medicine | Admitting: Internal Medicine

## 2021-07-13 ENCOUNTER — Other Ambulatory Visit: Payer: Self-pay

## 2021-07-13 ENCOUNTER — Encounter (HOSPITAL_BASED_OUTPATIENT_CLINIC_OR_DEPARTMENT_OTHER): Payer: Self-pay | Admitting: Obstetrics and Gynecology

## 2021-07-13 ENCOUNTER — Emergency Department (HOSPITAL_BASED_OUTPATIENT_CLINIC_OR_DEPARTMENT_OTHER): Payer: Medicare Other

## 2021-07-13 DIAGNOSIS — Z950 Presence of cardiac pacemaker: Secondary | ICD-10-CM | POA: Insufficient documentation

## 2021-07-13 DIAGNOSIS — A419 Sepsis, unspecified organism: Secondary | ICD-10-CM | POA: Insufficient documentation

## 2021-07-13 DIAGNOSIS — Z87891 Personal history of nicotine dependence: Secondary | ICD-10-CM | POA: Diagnosis not present

## 2021-07-13 DIAGNOSIS — Z79899 Other long term (current) drug therapy: Secondary | ICD-10-CM | POA: Diagnosis not present

## 2021-07-13 DIAGNOSIS — I10 Essential (primary) hypertension: Secondary | ICD-10-CM | POA: Insufficient documentation

## 2021-07-13 DIAGNOSIS — E876 Hypokalemia: Secondary | ICD-10-CM | POA: Insufficient documentation

## 2021-07-13 DIAGNOSIS — B338 Other specified viral diseases: Secondary | ICD-10-CM

## 2021-07-13 DIAGNOSIS — Z8546 Personal history of malignant neoplasm of prostate: Secondary | ICD-10-CM | POA: Insufficient documentation

## 2021-07-13 DIAGNOSIS — R0602 Shortness of breath: Secondary | ICD-10-CM | POA: Diagnosis not present

## 2021-07-13 DIAGNOSIS — J205 Acute bronchitis due to respiratory syncytial virus: Secondary | ICD-10-CM | POA: Insufficient documentation

## 2021-07-13 DIAGNOSIS — J9621 Acute and chronic respiratory failure with hypoxia: Secondary | ICD-10-CM | POA: Diagnosis not present

## 2021-07-13 DIAGNOSIS — Z7982 Long term (current) use of aspirin: Secondary | ICD-10-CM | POA: Insufficient documentation

## 2021-07-13 DIAGNOSIS — R059 Cough, unspecified: Secondary | ICD-10-CM | POA: Diagnosis not present

## 2021-07-13 DIAGNOSIS — I517 Cardiomegaly: Secondary | ICD-10-CM | POA: Diagnosis not present

## 2021-07-13 DIAGNOSIS — J189 Pneumonia, unspecified organism: Secondary | ICD-10-CM | POA: Diagnosis not present

## 2021-07-13 DIAGNOSIS — Z20822 Contact with and (suspected) exposure to covid-19: Secondary | ICD-10-CM | POA: Insufficient documentation

## 2021-07-13 DIAGNOSIS — R0902 Hypoxemia: Secondary | ICD-10-CM

## 2021-07-13 DIAGNOSIS — J441 Chronic obstructive pulmonary disease with (acute) exacerbation: Secondary | ICD-10-CM | POA: Diagnosis not present

## 2021-07-13 DIAGNOSIS — R0600 Dyspnea, unspecified: Secondary | ICD-10-CM | POA: Diagnosis present

## 2021-07-13 HISTORY — DX: Chronic obstructive pulmonary disease, unspecified: J44.9

## 2021-07-13 LAB — CBC WITH DIFFERENTIAL/PLATELET
Abs Immature Granulocytes: 0.02 10*3/uL (ref 0.00–0.07)
Basophils Absolute: 0.1 10*3/uL (ref 0.0–0.1)
Basophils Relative: 1 %
Eosinophils Absolute: 0.2 10*3/uL (ref 0.0–0.5)
Eosinophils Relative: 3 %
HCT: 39.2 % (ref 39.0–52.0)
Hemoglobin: 13.1 g/dL (ref 13.0–17.0)
Immature Granulocytes: 0 %
Lymphocytes Relative: 10 %
Lymphs Abs: 0.6 10*3/uL — ABNORMAL LOW (ref 0.7–4.0)
MCH: 30.6 pg (ref 26.0–34.0)
MCHC: 33.4 g/dL (ref 30.0–36.0)
MCV: 91.6 fL (ref 80.0–100.0)
Monocytes Absolute: 0.8 10*3/uL (ref 0.1–1.0)
Monocytes Relative: 12 %
Neutro Abs: 4.9 10*3/uL (ref 1.7–7.7)
Neutrophils Relative %: 74 %
Platelets: 148 10*3/uL — ABNORMAL LOW (ref 150–400)
RBC: 4.28 MIL/uL (ref 4.22–5.81)
RDW: 13.8 % (ref 11.5–15.5)
WBC: 6.5 10*3/uL (ref 4.0–10.5)
nRBC: 0 % (ref 0.0–0.2)

## 2021-07-13 LAB — I-STAT VENOUS BLOOD GAS, ED
Acid-Base Excess: 4 mmol/L — ABNORMAL HIGH (ref 0.0–2.0)
Acid-Base Excess: 5 mmol/L — ABNORMAL HIGH (ref 0.0–2.0)
Bicarbonate: 28.2 mmol/L — ABNORMAL HIGH (ref 20.0–28.0)
Bicarbonate: 29.7 mmol/L — ABNORMAL HIGH (ref 20.0–28.0)
Calcium, Ion: 1.11 mmol/L — ABNORMAL LOW (ref 1.15–1.40)
Calcium, Ion: 1.16 mmol/L (ref 1.15–1.40)
HCT: 39 % (ref 39.0–52.0)
HCT: 39 % (ref 39.0–52.0)
Hemoglobin: 13.3 g/dL (ref 13.0–17.0)
Hemoglobin: 13.3 g/dL (ref 13.0–17.0)
O2 Saturation: 65 %
O2 Saturation: 68 %
Patient temperature: 99.5
Potassium: 3 mmol/L — ABNORMAL LOW (ref 3.5–5.1)
Potassium: 3.8 mmol/L (ref 3.5–5.1)
Sodium: 141 mmol/L (ref 135–145)
Sodium: 144 mmol/L (ref 135–145)
TCO2: 29 mmol/L (ref 22–32)
TCO2: 31 mmol/L (ref 22–32)
pCO2, Ven: 42.3 mmHg — ABNORMAL LOW (ref 44.0–60.0)
pCO2, Ven: 43.1 mmHg — ABNORMAL LOW (ref 44.0–60.0)
pH, Ven: 7.433 — ABNORMAL HIGH (ref 7.250–7.430)
pH, Ven: 7.447 — ABNORMAL HIGH (ref 7.250–7.430)
pO2, Ven: 34 mmHg (ref 32.0–45.0)
pO2, Ven: 34 mmHg (ref 32.0–45.0)

## 2021-07-13 LAB — BRAIN NATRIURETIC PEPTIDE
B Natriuretic Peptide: 260 pg/mL — ABNORMAL HIGH (ref 0.0–100.0)
B Natriuretic Peptide: 266.5 pg/mL — ABNORMAL HIGH (ref 0.0–100.0)

## 2021-07-13 LAB — COMPREHENSIVE METABOLIC PANEL
ALT: 12 U/L (ref 0–44)
AST: 16 U/L (ref 15–41)
Albumin: 3.6 g/dL (ref 3.5–5.0)
Alkaline Phosphatase: 66 U/L (ref 38–126)
Anion gap: 12 (ref 5–15)
BUN: 14 mg/dL (ref 8–23)
CO2: 26 mmol/L (ref 22–32)
Calcium: 8.1 mg/dL — ABNORMAL LOW (ref 8.9–10.3)
Chloride: 102 mmol/L (ref 98–111)
Creatinine, Ser: 1.07 mg/dL (ref 0.61–1.24)
GFR, Estimated: >60 mL/min (ref >60)
Glucose, Bld: 113 mg/dL — ABNORMAL HIGH (ref 70–99)
Potassium: 3 mmol/L — ABNORMAL LOW (ref 3.5–5.1)
Sodium: 140 mmol/L (ref 135–145)
Total Bilirubin: 0.7 mg/dL (ref 0.3–1.2)
Total Protein: 6.1 g/dL — ABNORMAL LOW (ref 6.5–8.1)

## 2021-07-13 LAB — RESP PANEL BY RT-PCR (RSV, FLU A&B, COVID)  RVPGX2
Influenza A by PCR: NEGATIVE
Influenza B by PCR: NEGATIVE
Resp Syncytial Virus by PCR: POSITIVE — AB
SARS Coronavirus 2 by RT PCR: NEGATIVE

## 2021-07-13 LAB — RESP PANEL BY RT-PCR (FLU A&B, COVID) ARPGX2
Influenza A by PCR: NEGATIVE
Influenza B by PCR: NEGATIVE
SARS Coronavirus 2 by RT PCR: NEGATIVE

## 2021-07-13 LAB — LACTIC ACID, PLASMA
Lactic Acid, Venous: 1.7 mmol/L (ref 0.5–1.9)
Lactic Acid, Venous: 1.8 mmol/L (ref 0.5–1.9)

## 2021-07-13 LAB — MAGNESIUM: Magnesium: 1.8 mg/dL (ref 1.7–2.4)

## 2021-07-13 LAB — TROPONIN I (HIGH SENSITIVITY): Troponin I (High Sensitivity): 7 ng/L (ref <18)

## 2021-07-13 LAB — PROCALCITONIN: Procalcitonin: <0.1 ng/mL

## 2021-07-13 MED ORDER — LEVOFLOXACIN IN D5W 500 MG/100ML IV SOLN
500.0000 mg | Freq: Once | INTRAVENOUS | Status: DC
Start: 2021-07-13 — End: 2021-07-13

## 2021-07-13 MED ORDER — DOXYCYCLINE HYCLATE 100 MG PO TABS
100.0000 mg | ORAL_TABLET | Freq: Two times a day (BID) | ORAL | Status: DC
  Administered 2021-07-13: 100 mg via ORAL
  Filled 2021-07-13: qty 1

## 2021-07-13 MED ORDER — GUAIFENESIN ER 600 MG PO TB12
600.0000 mg | ORAL_TABLET | Freq: Two times a day (BID) | ORAL | Status: DC
  Administered 2021-07-13 – 2021-07-15 (×4): 600 mg via ORAL
  Filled 2021-07-13 (×4): qty 1

## 2021-07-13 MED ORDER — MAGNESIUM OXIDE -MG SUPPLEMENT 400 (240 MG) MG PO TABS
400.0000 mg | ORAL_TABLET | Freq: Once | ORAL | Status: AC
  Administered 2021-07-13: 400 mg via ORAL
  Filled 2021-07-13: qty 1

## 2021-07-13 MED ORDER — ACETAMINOPHEN 325 MG PO TABS: 650.0000 mg | ORAL_TABLET | Freq: Four times a day (QID) | ORAL | Status: DC | PRN

## 2021-07-13 MED ORDER — ALBUTEROL SULFATE (2.5 MG/3ML) 0.083% IN NEBU: 2.5000 mg | INHALATION_SOLUTION | RESPIRATORY_TRACT | Status: DC | PRN

## 2021-07-13 MED ORDER — POTASSIUM CHLORIDE CRYS ER 20 MEQ PO TBCR
40.0000 meq | EXTENDED_RELEASE_TABLET | Freq: Once | ORAL | Status: AC
  Administered 2021-07-13: 40 meq via ORAL
  Filled 2021-07-13: qty 2

## 2021-07-13 MED ORDER — SODIUM CHLORIDE 0.9 % IV SOLN
2.0000 g | INTRAVENOUS | Status: DC
  Administered 2021-07-13: 2 g via INTRAVENOUS
  Filled 2021-07-13: qty 20

## 2021-07-13 MED ORDER — SODIUM CHLORIDE 0.9 % IV SOLN
INTRAVENOUS | Status: DC | PRN
Start: 2021-07-13 — End: 2021-07-15

## 2021-07-13 MED ORDER — LACTATED RINGERS IV SOLN: INTRAVENOUS | Status: AC

## 2021-07-13 MED ORDER — ENOXAPARIN SODIUM 40 MG/0.4ML IJ SOSY
40.0000 mg | PREFILLED_SYRINGE | INTRAMUSCULAR | Status: DC
  Administered 2021-07-13 – 2021-07-14 (×2): 40 mg via SUBCUTANEOUS
  Filled 2021-07-13 (×2): qty 0.4

## 2021-07-13 MED ORDER — ACETAMINOPHEN 650 MG RE SUPP: 650.0000 mg | Freq: Four times a day (QID) | RECTAL | Status: DC | PRN

## 2021-07-13 MED ORDER — SODIUM CHLORIDE 0.9 % IV BOLUS
1000.0000 mL | Freq: Once | INTRAVENOUS | Status: AC
  Administered 2021-07-13: 1000 mL via INTRAVENOUS

## 2021-07-13 NOTE — ED Notes (Signed)
Changed cuff size to appropriate size and switched arms to reassess BP right arm 98/77.

## 2021-07-13 NOTE — ED Notes (Signed)
Placed on 2L

## 2021-07-13 NOTE — ED Notes (Signed)
20G placed in L AC without complication. IV flushed and saline locked. No complications noted during placement.

## 2021-07-13 NOTE — ED Notes (Signed)
Notified MD of systolic pressures

## 2021-07-13 NOTE — ED Notes (Signed)
Consulted with RT and O-2 stats

## 2021-07-13 NOTE — ED Provider Notes (Signed)
[facility]ERGENCY DEPT Provider Note   CSN: [id number] Arrival date & time: 07/13/21  0756     History Chief Complaint  Patient presents with  . Shortness of Breath    Tony [name] is a 80 y.o. male.  This is a 80 y.o. male with significant medical history as below, including COPD on PRN home O2, afib who presents to the ED with complaint of cough. Has not had to increase home oxygen, no change in dyspnea from his baseline. Feels he is breathing normally at this time, breathing unchanged with ambulation from baseline. He has been using his albuterol inhaler more frequently to try and help his cough not 2/2 dyspnea.   Location:  n/a Duration:  2 days ago Onset:  gradual Timing:  constant Description:  cough with clear/yellow sputum Severity:  mild Exacerbating/Alleviating Factors:  none identified Associated Symptoms:  none reported Pertinent Negatives:  no fevers, chills, dyspnea, chest pain, nausea, vomiting, change to bowel/bladder fxn    The history is provided by the patient. No language interpreter was used.  Shortness of Breath Associated symptoms: no abdominal pain, no chest pain, no cough, no fever, no headaches, no rash and no vomiting       Past Medical History:  Diagnosis Date  . COPD (chronic obstructive pulmonary disease) ([place])   . Gastric outlet obstruction 02/03/2018   [name] 02/03/2018  . GERD (gastroesophageal reflux disease)   . History of hiatal hernia    small/notes 02/03/2018  . Hypertension    no meds  . Presence of permanent cardiac pacemaker   . Prostate cancer ([place]) 02/08/2014   Gleason 4+5=9, PSA 15.65  . Radiation   . Sinus problem   . SSS (sick sinus syndrome) [facility])     Patient Active Problem List   Diagnosis Date Noted  . Dyspnea 07/13/2021  . Atherosclerosis of aorta ([place]) 06/20/2021  . OSA (obstructive sleep apnea) 07/25/2020  . 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 Morbid obesity ([place]) 11/14/2019  .  Prediabetes 11/14/2019  . Varicose veins of both lower extremities 05/17/2019  . Bilateral lower extremity edema 03/13/2019  . Hypokalemia 03/13/2019  . 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 SSS (sick sinus syndrome) ([place]) 06/14/2018  . 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 Coronary artery disease involving native coronary artery of native heart without angina pectoris 06/08/2018  . Sick sinus syndrome ([place]) 06/08/2018  . 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 COPD mixed type ([place]) 02/03/2018  . 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 Acute appendicitis 01/21/2018  . Perforated appendicitis 01/21/2018  . Chronic fatigue 06/01/2017  . Essential hypertension, benign 07/09/2016  . AAA (abdominal aortic aneurysm) without rupture 07/06/2016  . Coronary artery calcification seen on CAT scan 05/15/2016  . Anxiety disorder 04/08/2016  . Insomnia 04/08/2016  . 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 Tobacco use disorder 04/08/2016  . Hypercholesterolemia 04/08/2016  . Malignant neoplasm of prostate ([place]) 02/23/2014    Past Surgical History:  Procedure Laterality Date  . APPENDECTOMY  2019   hx hematoma s/p appendectomy  . ESOPHAGOGASTRODUODENOSCOPY (EGD) WITH PROPOFOL N/A 02/03/2018   Procedure: ESOPHAGOGASTRODUODENOSCOPY (EGD) WITH PROPOFOL;  Surgeon: Clarene Essex, MD;  Location: [facility];  Service: Endoscopy;  Laterality: N/A;  . FRACTURE SURGERY    . INSERT / REPLACE / REMOVE PACEMAKER  06/14/2018  . LAPAROSCOPIC APPENDECTOMY N/A 01/21/2018   Procedure: APPENDECTOMY LAPAROSCOPIC;  Surgeon: Kinsinger, Arta Bruce, MD;  Location: [facility];  Service:  General;  Laterality: N/A;  . PACEMAKER IMPLANT N/A 06/14/2018   Procedure: PACEMAKER IMPLANT - Dual Chamber;  Surgeon: Sanda Klein, MD;  Location: [facility];  Service: Cardiovascular;  Laterality: N/A;  . PROSTATE BIOPSY  02/08/14   gleason 4+5=9, 12/12 cores positive, 54  gm  . RADIOACTIVE SEED IMPLANT N/A 06/29/2014   Procedure: RADIOACTIVE SEED IMPLANT;  Surgeon: Bernestine Amass, MD;  Location: [facility];  Service: Urology;  Laterality: N/A;  . WRIST FRACTURE SURGERY Right 1980s       Family History  Problem Relation Age of Onset  . Alzheimer's disease Mother   . Alzheimer's disease Father   . Cancer Father        prostate  . Arthritis Sister   . Cancer Brother        prostate    Social History   Tobacco Use  . Smoking status: Former    Types: Cigars    Quit date: 09/14/2012    Years since quitting: 8.8  . Smokeless tobacco: Never  . Tobacco comments:    little cigars; the small ones"  Vaping Use  . Vaping Use: Never used  Substance Use Topics  . Alcohol use: Yes    Alcohol/week: 14.0 standard drinks    Types: 14 Cans of beer per week  . Drug use: No    Home Medications Prior to Admission medications   Medication Sig Start Date End Date Taking? Authorizing Provider  abiraterone acetate (ZYTIGA) 250 MG tablet TAKE 4 TABLETS (1,000 MG TOTAL) BY MOUTH DAILY. TAKE ON AN EMPTY STOMACH 1 HOUR BEFORE OR 2 HOURS AFTER A MEAL 06/26/21 06/26/22  Wyatt Portela, MD  acetaminophen (TYLENOL) 500 MG tablet Take 1,000 mg by mouth every 6 (six) hours as needed for headache (pain).     [provider]  albuterol (VENTOLIN HFA) 108 (90 Base) MCG/ACT inhaler TAKE 2 PUFFS BY MOUTH EVERY 6 HOURS AS NEEDED FOR WHEEZE OR SHORTNESS OF BREATH 04/16/21   Baird Lyons D, MD  amLODipine (NORVASC) 2.5 MG tablet TAKE 1 TABLET BY MOUTH EVERY DAY 01/27/21   [name], [name], MD  Ascorbic Acid (VITAMIN C PO) Take 1 tablet by mouth daily.    [provider]  aspirin EC 81 MG tablet Take 81 mg by mouth daily.    [provider]  atorvastatin (LIPITOR) 20 MG tablet TAKE 1 TABLET BY MOUTH EVERY DAY 04/17/21   Croitoru, Mihai, MD  benzonatate (TESSALON) 200 MG capsule Take 1 capsule (200 mg total) by mouth daily as needed for cough.  06/20/21 07/20/21  [name], [name], MD  budesonide-formoterol [facility]) 160-4.5 MCG/ACT inhaler Inhale 2 puffs then rinse mouth, twice daily 04/16/21   Baird Lyons D, MD  carbamide peroxide (DEBROX) 6.5 % OTIC solution Place 5 drops into both ears daily as needed (for earwax).    [provider]  citalopram (CELEXA) 10 MG tablet TAKE 1 TABLET BY MOUTH EVERY DAY 01/27/21   [name], [name], MD  diclofenac Sodium (VOLTAREN) 1 % GEL AAPLY 4 GRAMS TOPICALLY 4 TIMES DAILY 02/14/21   [name], [name], MD  docusate sodium (COLACE) 100 MG capsule Take 100 mg by mouth daily with supper.    [provider]  fluticasone (FLONASE) 50 MCG/ACT nasal spray PLACE 1 SPRAY INTO BOTH NOSTRILS DAILY AS NEEDED FOR ALLERGIES OR RHINITIS (AT BEDTIME). Patient taking differently: Place 1 spray into both nostrils at bedtime as needed for allergies or rhinitis. 09/04/19  [name], [name], MD  furosemide (LASIX) 20 MG tablet TAKE 1 TABLET BY MOUITH DAILY FOR 5 DAYS , THEN DAILY AS NEEDED FOR EDEMA 07/01/21   [name], [name], MD  LORazepam (ATIVAN) 1 MG tablet TAKE 1 TABLET BY MOUTH TWICE A DAY AS NEEDED. DNFB 6/18 07/02/21   [name], [name], MD  metoprolol tartrate (LOPRESSOR) 25 MG tablet TAKE 1 TABLET BY MOUTH TWICE A DAY 09/30/20   Croitoru, Mihai, MD  Multiple Vitamin (MULTIVITAMIN WITH MINERALS) TABS tablet Take 0.5 tablets by mouth every other day. Centrum    [provider]  omeprazole (PRILOSEC) 20 MG capsule TAKE 1 CAPSULE (20 MG TOTAL) BY MOUTH DAILY BEFORE BREAKFAST. 04/18/21   [name], [name], MD  predniSONE (DELTASONE) 5 MG tablet TAKE 1 TABLET BY MOUTH EVERY DAY WITH BREAKFAST 02/13/21   Wyatt Portela, MD  Tetrahydrozoline HCl (VISINE OP) Place 1 drop into both eyes daily as needed (red eyes).    [provider]    Allergies    Penicillins  Review of Systems   Review of Systems  Constitutional:  Negative for chills and fever.  HENT:  Negative for facial swelling and trouble  swallowing.   Eyes:  Negative for photophobia and visual disturbance.  Respiratory:  Positive for shortness of breath. Negative for cough.   Cardiovascular:  Negative for chest pain and palpitations.  Gastrointestinal:  Negative for abdominal pain, nausea and vomiting.  Endocrine: Negative for polydipsia and polyuria.  Genitourinary:  Negative for difficulty urinating and hematuria.  Musculoskeletal:  Negative for gait problem and joint swelling.  Skin:  Negative for pallor and rash.  Neurological:  Negative for syncope and headaches.  Psychiatric/Behavioral:  Negative for agitation and confusion.    Physical Exam Updated Vital Signs BP 96/66   Pulse (!) 59   Temp 99.5 F (37.5 C) (Oral)   Resp 16   SpO2 97%   Physical Exam Vitals and nursing note reviewed.  Constitutional:      General: He is not in acute distress.    Appearance: He is well-developed. He is not ill-appearing.  HENT:     Head: Normocephalic and atraumatic.     Right Ear: External ear normal.     Left Ear: External ear normal.     Mouth/Throat:     Mouth: Mucous membranes are moist.  Eyes:     General: No scleral icterus. Cardiovascular:     Rate and Rhythm: Normal rate and regular rhythm.     Pulses: Normal pulses.     Heart sounds: Normal heart sounds.  Pulmonary:     Effort: Pulmonary effort is normal. No tachypnea, accessory muscle usage or respiratory distress.     Breath sounds: Normal breath sounds. No wheezing.  Abdominal:     General: Abdomen is flat.     Palpations: Abdomen is soft.     Tenderness: There is no abdominal tenderness.  Musculoskeletal:        General: Normal range of motion.     Cervical back: Normal range of motion.     Right lower leg: No edema.     Left lower leg: No edema.  Skin:    General: Skin is warm and dry.     Capillary Refill: Capillary refill takes less than 2 seconds.  Neurological:     Mental Status: He is alert and oriented to person, place, and time.   Psychiatric:        Mood and Affect: Mood normal.  Behavior: Behavior normal.    ED Results / Procedures / Treatments   Labs (all labs ordered are listed, but only abnormal results are displayed) Labs Reviewed  CBC WITH DIFFERENTIAL/PLATELET - Abnormal; Notable for the following components:      Result Value   Platelets 148 (*)    Lymphs Abs 0.6 (*)    All other components within normal limits  BRAIN NATRIURETIC PEPTIDE - Abnormal; Notable for the following components:   B Natriuretic Peptide 260.0 (*)    All other components within normal limits  COMPREHENSIVE METABOLIC PANEL - Abnormal; Notable for the following components:   Potassium 3.0 (*)    Glucose, Bld 113 (*)    Calcium 8.1 (*)    Total Protein 6.1 (*)    All other components within normal limits  I-STAT VENOUS BLOOD GAS, ED - Abnormal; Notable for the following components:   pH, Ven 7.433 (*)    pCO2, Ven 42.3 (*)    Bicarbonate 28.2 (*)    Acid-Base Excess 4.0 (*)    Potassium 3.0 (*)    Calcium, Ion 1.11 (*)    All other components within normal limits  RESP PANEL BY RT-PCR (FLU A&B, COVID) ARPGX2  CULTURE, BLOOD (SINGLE)  EXPECTORATED SPUTUM ASSESSMENT W GRAM STAIN, RFLX TO RESP C  LACTIC ACID, PLASMA  LACTIC ACID, PLASMA  PROCALCITONIN  MAGNESIUM  TROPONIN I (HIGH SENSITIVITY)    EKG EKG Interpretation  Date/Time:  Sunday July 13 2021 08:44:16 EDT Ventricular Rate:  63 PR Interval:  244 QRS Duration: 178 QT Interval:  489 QTC Calculation: 501 R Axis:   -60 Text Interpretation: Sinus rhythm Prolonged PR interval Right bundle branch block Similar to prior tracing Confirmed by Wynona Dove ([id number]) on 07/13/2021 10:26:06 AM  Radiology DG Chest Portable 1 View  Result Date: 07/13/2021 CLINICAL DATA:  Productive cough. EXAM: PORTABLE CHEST 1 VIEW COMPARISON:  July 18, 2020. FINDINGS: Stable cardiomegaly. Left-sided pacemaker is unchanged in position. No pneumothorax or pleural effusion is  noted. Minimal reticular densities are noted in the perihilar and basilar regions bilaterally which may represent scarring or atelectasis, but acute superimposed edema or atypical inflammation cannot be excluded. Bony thorax is unremarkable. IMPRESSION: Minimal reticular densities are noted in the perihilar and basilar regions bilaterally which may represent scarring or atelectasis, but acute superimposed edema or atypical inflammation cannot be excluded. Electronically Signed   By: Marijo Conception M.D.   On: 07/13/2021 09:12    Procedures .Critical Care Performed by: Jeanell Sparrow, DO Authorized by: Jeanell Sparrow, DO   Critical care provider statement:    Critical care time (minutes):  36   Critical care time was exclusive of:  Separately billable procedures and treating other patients   Critical care was necessary to treat or prevent imminent or life-threatening deterioration of the following conditions:  Sepsis and respiratory failure   Critical care was time spent personally by me on the following activities:  Ordering and performing treatments and interventions, ordering and review of laboratory studies, ordering and review of radiographic studies, pulse oximetry, re-evaluation of patient's condition, review of old charts, obtaining history from patient or surrogate, examination of patient, evaluation of patient's response to treatment and development of treatment plan with patient or surrogate   Care discussed with: admitting provider   Comments:         Medications Ordered in ED Medications  cefTRIAXone (ROCEPHIN) 2 g in sodium chloride 0.9 % 100 mL IVPB (has no administration  in time range)  doxycycline (VIBRA-TABS) tablet 100 mg (100 mg Oral Given 07/13/21 1017)  lactated ringers infusion ( Intravenous New Bag/Given 07/13/21 1024)  sodium chloride 0.9 % bolus 1,000 mL (0 mLs Intravenous Stopped 07/13/21 1019)  potassium chloride SA (KLOR-CON) CR tablet 40 mEq (40 mEq Oral Given  07/13/21 1020)  magnesium oxide (MAG-OX) tablet 400 mg (400 mg Oral Given 07/13/21 1019)    ED Course  I have reviewed the triage vital signs and the nursing notes.  Pertinent labs & imaging results that were available during my care of the patient were reviewed by me and considered in my medical decision making (see chart for details).  Clinical Course as of 07/13/21 1058  Sun Jul 13, 2021  1058 Callback from lab, pt's RSV was positive but does not display in the chart.  [SG]    Clinical Course User Index [SG] Jeanell Sparrow, DO   MDM Rules/Calculators/A&P                           CC: cough  This patient complains of cough; this involves an extensive number of treatment options and is a complaint that carries with it a high risk of complications and morbidity. Vital signs were reviewed. Serious etiologies considered.  Record review:  Previous records obtained and reviewed   Work up as above, notable for:  Imaging results that were available during my care of the patient were reviewed by me and considered in my medical decision making.   I ordered imaging studies which included CXR and I independently visualized and interpreted imaging which showed pna, pos edema.   Reassessment:  Further discussion with the patient/spouse, he has been having worsening dyspnea, along with his productive cough with yellow sputum. Low grade temperature in the ED. CXR with possible pneumonia. Hypotensive with systolic 60'F. BNP was around 200, prior echo with EF 70%, favor pneumonia as etiology of hypotension, will give IVF as hypotensive. If BP not improved with IVF will consider stress dose steroids. Start rocephin and doxy after sending cultures. Patient did desaturate upon arrival to 85% while ambulating, he was not wearing home oxygen and when he got to his bed his pulse ox came up to 92-95% on room air, he did not have dyspnea during this episode. Will monitor closely, home oxygen PRN. Will  escalate o2 if needed.   At this time recommend admission for sepsis likely 2/2 pneumonia. Pt and spouse agreeable. D/w Dr Marylyn Ishihara who accepts patient for admission.          This chart was dictated using voice recognition software.  Despite best efforts to proofread,  errors can occur which can change the documentation meaning.  Final Clinical Impression(s) / ED Diagnoses Final diagnoses:  Community acquired pneumonia, unspecified laterality  Sepsis, due to unspecified organism, unspecified whether acute organ dysfunction present ([place])  Hypokalemia  Hypoxia  COPD exacerbation ([place])    Rx / DC Orders ED Discharge Orders     None        Jeanell Sparrow, DO 07/13/21 1026

## 2021-07-13 NOTE — ED Notes (Signed)
Report Called to [facility]

## 2021-07-13 NOTE — H&P (Signed)
History and Physical    Tony [name]:[id number] DOB: [birth year] DOA: 07/13/2021  PCP: [name], [name], MD  Patient coming from: Home  Chief Complaint: shortness of breath, cough  HPI: Tony [name] is a 80 y.o. male with medical history significant of SSS, COPD, HTN, HLD, chronic hypoxic respiratory failure. Presenting with dyspnea. His symptoms started 3 days ago. He had increased cough. It was productive. He did not notice a fever or chills. He states that he felt tight this morning with his cough and generally ill. He tried OTC cough meds and increasing his oxygen, but it didn't help. Since his symptoms did not improve, he decided to come to the ED.   ED Course: He was started on rocephin/doxy for CAP vs COPD. [facility] was called for admission.   Review of Systems:  Denies CP, palpitations, syncopal episodes, N/V/D, fevers. Reports sick contact (wife). Review of systems is otherwise negative for all not mentioned in HPI.   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   Radiation    Sinus problem    SSS (sick sinus syndrome) ([place])     PSHx Past Surgical History:  Procedure Laterality Date   APPENDECTOMY  2019   hx hematoma s/p appendectomy   ESOPHAGOGASTRODUODENOSCOPY (EGD) WITH PROPOFOL N/A 02/03/2018   Procedure: ESOPHAGOGASTRODUODENOSCOPY (EGD) WITH PROPOFOL;  Surgeon: Clarene Essex, MD;  Location: [facility];  Service: Endoscopy;  Laterality: N/A;   FRACTURE SURGERY     INSERT / REPLACE / REMOVE PACEMAKER  06/14/2018   LAPAROSCOPIC APPENDECTOMY N/A 01/21/2018   Procedure: APPENDECTOMY LAPAROSCOPIC;  Surgeon: Kinsinger, Arta Bruce, MD;  Location: [facility];  Service: General;  Laterality: N/A;   PACEMAKER IMPLANT N/A 06/14/2018   Procedure: PACEMAKER IMPLANT - Dual Chamber;  Surgeon: Sanda Klein, MD;  Location: [facility];  Service: Cardiovascular;  Laterality: N/A;   PROSTATE BIOPSY  02/08/14   gleason 4+5=9, 12/12 cores positive, 54 gm   RADIOACTIVE SEED IMPLANT N/A 06/29/2014   Procedure: RADIOACTIVE SEED IMPLANT;  Surgeon: Bernestine Amass, MD;  Location: [facility];  Service: Urology;  Laterality: N/A;   WRIST FRACTURE SURGERY Right 1980s    SocHx  reports that he quit smoking about 8 years ago. His smoking use included cigars. He has never used smokeless tobacco. He reports current alcohol use of about 14.0 standard drinks per week. He reports that he does not use drugs.  Allergies  Allergen Reactions   Penicillins Rash    Has patient had a PCN reaction causing immediate rash, facial/tongue/throat swelling, SOB or lightheadedness with hypotension: Yes Has patient had a PCN reaction causing severe rash involving mucus membranes or skin necrosis: No Has patient had a PCN reaction that required hospitalization: No Has patient had a PCN reaction occurring within the last 10 years: No If all of the above answers are "NO", then may proceed with Cephalosporin use.    FamHx Family History  Problem Relation Age of Onset   Alzheimer's disease Mother    Alzheimer's disease Father    Cancer Father        prostate  Arthritis Sister    Cancer Brother        prostate    Prior to Admission medications   Medication Sig Start Date End Date Taking? Authorizing Provider  abiraterone acetate (ZYTIGA) 250 MG tablet TAKE 4 TABLETS (1,000 MG TOTAL) BY MOUTH DAILY. TAKE ON AN EMPTY STOMACH 1 HOUR BEFORE OR 2 HOURS AFTER A MEAL 06/26/21 06/26/22  Wyatt Portela, MD  acetaminophen (TYLENOL) 500 MG tablet Take 1,000 mg by mouth every 6 (six) hours as needed for headache (pain).     [provider]  albuterol (VENTOLIN HFA) 108 (90 Base) MCG/ACT inhaler TAKE 2 PUFFS BY MOUTH EVERY 6 HOURS AS NEEDED FOR WHEEZE OR SHORTNESS OF BREATH 04/16/21   Baird Lyons D, MD  amLODipine (NORVASC) 2.5 MG tablet TAKE 1 TABLET BY MOUTH EVERY DAY 01/27/21   [name], [name], MD  Ascorbic Acid (VITAMIN C PO) Take 1 tablet by mouth daily.    [provider]  aspirin EC 81 MG tablet Take 81 mg by mouth daily.    [provider]  atorvastatin (LIPITOR) 20 MG tablet TAKE 1 TABLET BY MOUTH EVERY DAY 04/17/21   Croitoru, Mihai, MD  benzonatate (TESSALON) 200 MG capsule Take 1 capsule (200 mg total) by mouth daily as needed for cough. 06/20/21 07/20/21  [name], [name], MD  budesonide-formoterol [facility]) 160-4.5 MCG/ACT inhaler Inhale 2 puffs then rinse mouth, twice daily 04/16/21   Baird Lyons D, MD  carbamide peroxide (DEBROX) 6.5 % OTIC solution Place 5 drops into both ears daily as needed (for earwax).    [provider]  citalopram (CELEXA) 10 MG tablet TAKE 1 TABLET BY MOUTH EVERY DAY 01/27/21   [name], [name], MD  diclofenac Sodium (VOLTAREN) 1 % GEL AAPLY 4 GRAMS TOPICALLY 4 TIMES DAILY 02/14/21   [name], [name], MD  docusate sodium (COLACE) 100 MG capsule Take 100 mg by mouth daily with supper.    [provider]  fluticasone (FLONASE) 50 MCG/ACT nasal spray PLACE 1 SPRAY INTO BOTH NOSTRILS DAILY AS NEEDED FOR ALLERGIES OR RHINITIS (AT BEDTIME). Patient taking differently: Place 1 spray into both nostrils at bedtime as needed for allergies or rhinitis. 09/04/19   [name], [name], MD  furosemide (LASIX) 20 MG tablet TAKE 1 TABLET BY MOUITH DAILY FOR 5 DAYS , THEN DAILY AS NEEDED FOR EDEMA 07/01/21   [name], [name], MD  LORazepam (ATIVAN) 1 MG tablet TAKE 1 TABLET BY MOUTH TWICE A DAY AS NEEDED. DNFB 6/18 07/02/21   [name], [name], MD  metoprolol tartrate (LOPRESSOR) 25 MG tablet TAKE 1 TABLET BY MOUTH TWICE A DAY 09/30/20   Croitoru, Mihai, MD  Multiple Vitamin  (MULTIVITAMIN WITH MINERALS) TABS tablet Take 0.5 tablets by mouth every other day. Centrum    [provider]  omeprazole (PRILOSEC) 20 MG capsule TAKE 1 CAPSULE (20 MG TOTAL) BY MOUTH DAILY BEFORE BREAKFAST. 04/18/21   [name], [name], MD  predniSONE (DELTASONE) 5 MG tablet TAKE 1 TABLET BY MOUTH EVERY DAY WITH BREAKFAST 02/13/21   Wyatt Portela, MD  Tetrahydrozoline HCl (VISINE OP) Place 1 drop into both eyes daily as needed (red eyes).    [provider]    Physical Exam: Vitals:   07/13/21 1145 07/13/21 1230 07/13/21 1300 07/13/21 1602  BP: 98/71 107/74 99/75 (!) 152/96  Pulse: 61 60 60 67  Resp: 19 20 13 18   Temp:    (!) 97.5 F (36.4 C)  TempSrc:  Oral  SpO2: 93% 90% 92% 90%    General: 80 y.o. male resting in bed in NAD Eyes: PERRL, normal sclera ENMT: Nares patent w/o discharge, orophaynx clear, dentition normal, ears w/o discharge/lesions/ulcers Neck: Supple, trachea midline Cardiovascular: RRR, +S1, S2, no m/g/r, equal pulses throughout Respiratory: decreased at bases, normal WOB on 2L GI: BS+, NDNT, no masses noted, no organomegaly noted MSK: No c/c; trace pedal edema Skin: No rashes, bruises, ulcerations noted Neuro: A&O x 3, no focal deficits Psyc: Appropriate interaction and affect, calm/cooperative  Labs on Admission: I have personally reviewed following labs and imaging studies  CBC: Recent Labs  Lab 07/13/21 0901 07/13/21 0903 07/13/21 1440  WBC 6.5  --   --   NEUTROABS 4.9  --   --   HGB 13.1 13.3 13.3  HCT 39.2 39.0 39.0  MCV 91.6  --   --   PLT 148*  --   --    Basic Metabolic Panel: Recent Labs  Lab 07/13/21 0901 07/13/21 0903 07/13/21 1002 07/13/21 1440  NA 140 141  --  144  K 3.0* 3.0*  --  3.8  CL 102  --   --   --   CO2 26  --   --   --   GLUCOSE 113*  --   --   --   BUN 14  --   --   --   CREATININE 1.07  --   --   --   CALCIUM 8.1*  --   --   --   MG  --   --  1.8  --    GFR: Estimated Creatinine Clearance:  65.9 mL/min (by C-G formula based on SCr of 1.07 mg/dL). Liver Function Tests: Recent Labs  Lab 07/13/21 0901  AST 16  ALT 12  ALKPHOS 66  BILITOT 0.7  PROT 6.1*  ALBUMIN 3.6   No results for input(s): LIPASE, AMYLASE in the last 168 hours. No results for input(s): AMMONIA in the last 168 hours. Coagulation Profile: No results for input(s): INR, PROTIME in the last 168 hours. Cardiac Enzymes: No results for input(s): CKTOTAL, CKMB, CKMBINDEX, TROPONINI in the last 168 hours. BNP (last 3 results) Recent Labs    06/23/21 1132  PROBNP 158.0*   HbA1C: No results for input(s): HGBA1C in the last 72 hours. CBG: No results for input(s): GLUCAP in the last 168 hours. Lipid Profile: No results for input(s): CHOL, HDL, LDLCALC, TRIG, CHOLHDL, LDLDIRECT in the last 72 hours. Thyroid Function Tests: No results for input(s): TSH, T4TOTAL, FREET4, T3FREE, THYROIDAB in the last 72 hours. Anemia Panel: No results for input(s): VITAMINB12, FOLATE, FERRITIN, TIBC, IRON, RETICCTPCT in the last 72 hours. Urine analysis:    Component Value Date/Time   COLORURINE YELLOW 02/02/2018 2156   APPEARANCEUR CLEAR 02/02/2018 2156   LABSPEC >1.046 (H) 02/02/2018 2156   PHURINE 7.0 02/02/2018 2156   GLUCOSEU NEGATIVE 02/02/2018 2156   HGBUR NEGATIVE 02/02/2018 2156   BILIRUBINUR NEGATIVE 02/02/2018 2156   BILIRUBINUR 1+ 01/21/2018 1019   KETONESUR 20 (A) 02/02/2018 2156   PROTEINUR 30 (A) 02/02/2018 2156   UROBILINOGEN 1.0 01/21/2018 1019   UROBILINOGEN 0.2 06/03/2014 0821   NITRITE NEGATIVE 02/02/2018 2156   LEUKOCYTESUR NEGATIVE 02/02/2018 2156    Radiological Exams on Admission: DG Chest Portable 1 View  Result Date: 07/13/2021 CLINICAL DATA:  Productive cough. EXAM: PORTABLE CHEST 1 VIEW COMPARISON:  July 18, 2020. FINDINGS: Stable cardiomegaly. Left-sided pacemaker is unchanged in position. No pneumothorax  or pleural effusion is noted. Minimal reticular densities are noted in the  perihilar and basilar regions bilaterally which may represent scarring or atelectasis, but acute superimposed edema or atypical inflammation cannot be excluded. Bony thorax is unremarkable. IMPRESSION: Minimal reticular densities are noted in the perihilar and basilar regions bilaterally which may represent scarring or atelectasis, but acute superimposed edema or atypical inflammation cannot be excluded. Electronically Signed   By: Marijo Conception M.D.   On: 07/13/2021 09:12    EKG: Independently reviewed. Sinus, RBBB previously seen  Assessment/Plan RSV infection Acute on chronic respiratory failure; BiPap use at night COPD exacerbation d/t RSV     - placed in obs, tele     - initially started on rocephin/doxy with a working dx of COPD vs PNA; procal is negative; will hold further abx for now     - continue supportive care: nebs, anti-tussives, O2 support     - continue bipap qHS     -  he was initially hypotensive and responded to fluids nice; monitor  Hypokalemia     - responded to K+ replacement, Mg2+ is ok  Elevated BNP     - BNP is 266, he denies history of HF     - check echo  HLD     - continue home regimen  HTN     - hypotensive this morning; responded to fluid; reassess in AM for resuming his home meds  DVT prophylaxis: lovenox  Code Status: FULL  Family Communication: w/ wife at bedside  Consults called: None   Status is: Observation  The patient remains OBS appropriate and will d/c before 2 midnights.  Jonnie Finner DO [facility]  If 7PM-7AM, please contact night-coverage [email or web address]  07/13/2021, 4:27 PM

## 2021-07-13 NOTE — ED Notes (Signed)
Left arm consistent with right arm BP 96/66

## 2021-07-13 NOTE — ED Triage Notes (Signed)
Patient presents to the ER for cough and ShoB. Patient has a hx of COPD. Patient reports he and his wife have been sick x2 weeks.

## 2021-07-13 NOTE — Progress Notes (Signed)
Notified by EDP of need for admission d/t dyspnea/cough. [facility] accepts patient to tele at [facility]. EDP is to remain responsible for orders/medical decisions while patient is holding at [facility]. Upon arrival to [facility], [facility] will assume care. Nursing staff will call patient placement to notify them of patient's arrival so that the proper [facility] member may receive the patient.Thank you.

## 2021-07-14 ENCOUNTER — Ambulatory Visit: Payer: Medicare Other

## 2021-07-14 ENCOUNTER — Telehealth: Payer: Self-pay

## 2021-07-14 ENCOUNTER — Observation Stay (HOSPITAL_BASED_OUTPATIENT_CLINIC_OR_DEPARTMENT_OTHER): Payer: Medicare Other

## 2021-07-14 DIAGNOSIS — E876 Hypokalemia: Secondary | ICD-10-CM | POA: Diagnosis not present

## 2021-07-14 DIAGNOSIS — J441 Chronic obstructive pulmonary disease with (acute) exacerbation: Secondary | ICD-10-CM

## 2021-07-14 DIAGNOSIS — R0609 Other forms of dyspnea: Secondary | ICD-10-CM

## 2021-07-14 DIAGNOSIS — J189 Pneumonia, unspecified organism: Secondary | ICD-10-CM | POA: Diagnosis not present

## 2021-07-14 DIAGNOSIS — R0602 Shortness of breath: Secondary | ICD-10-CM | POA: Diagnosis not present

## 2021-07-14 DIAGNOSIS — I509 Heart failure, unspecified: Secondary | ICD-10-CM | POA: Diagnosis not present

## 2021-07-14 DIAGNOSIS — R0902 Hypoxemia: Secondary | ICD-10-CM | POA: Diagnosis not present

## 2021-07-14 LAB — CBC
HCT: 40.3 % (ref 39.0–52.0)
Hemoglobin: 13.1 g/dL (ref 13.0–17.0)
MCH: 30.8 pg (ref 26.0–34.0)
MCHC: 32.5 g/dL (ref 30.0–36.0)
MCV: 94.6 fL (ref 80.0–100.0)
Platelets: 141 10*3/uL — ABNORMAL LOW (ref 150–400)
RBC: 4.26 MIL/uL (ref 4.22–5.81)
RDW: 14.1 % (ref 11.5–15.5)
WBC: 5.7 10*3/uL (ref 4.0–10.5)
nRBC: 0 % (ref 0.0–0.2)

## 2021-07-14 LAB — COMPREHENSIVE METABOLIC PANEL
ALT: 15 U/L (ref 0–44)
AST: 23 U/L (ref 15–41)
Albumin: 3.6 g/dL (ref 3.5–5.0)
Alkaline Phosphatase: 66 U/L (ref 38–126)
Anion gap: 8 (ref 5–15)
BUN: 14 mg/dL (ref 8–23)
CO2: 26 mmol/L (ref 22–32)
Calcium: 8.7 mg/dL — ABNORMAL LOW (ref 8.9–10.3)
Chloride: 105 mmol/L (ref 98–111)
Creatinine, Ser: 0.88 mg/dL (ref 0.61–1.24)
GFR, Estimated: >60 mL/min (ref >60)
Glucose, Bld: 110 mg/dL — ABNORMAL HIGH (ref 70–99)
Potassium: 3.4 mmol/L — ABNORMAL LOW (ref 3.5–5.1)
Sodium: 139 mmol/L (ref 135–145)
Total Bilirubin: 0.5 mg/dL (ref 0.3–1.2)
Total Protein: 6.3 g/dL — ABNORMAL LOW (ref 6.5–8.1)

## 2021-07-14 LAB — ECHOCARDIOGRAM COMPLETE
Area-P 1/2: 4.39 cm2
Height: 68 in
S' Lateral: 3.5 cm
Weight: 3632 oz

## 2021-07-14 MED ORDER — BUDESONIDE 0.25 MG/2ML IN SUSP
0.2500 mg | Freq: Two times a day (BID) | RESPIRATORY_TRACT | Status: DC
  Filled 2021-07-14: qty 2

## 2021-07-14 MED ORDER — METOPROLOL TARTRATE 25 MG PO TABS
25.0000 mg | ORAL_TABLET | Freq: Two times a day (BID) | ORAL | Status: DC
  Administered 2021-07-14 – 2021-07-15 (×3): 25 mg via ORAL
  Filled 2021-07-14 (×3): qty 1

## 2021-07-14 MED ORDER — AMLODIPINE BESYLATE 5 MG PO TABS
2.5000 mg | ORAL_TABLET | Freq: Every day | ORAL | Status: DC
  Administered 2021-07-14: 2.5 mg via ORAL
  Filled 2021-07-14: qty 1

## 2021-07-14 MED ORDER — ASPIRIN EC 81 MG PO TBEC
81.0000 mg | DELAYED_RELEASE_TABLET | Freq: Every day | ORAL | Status: DC
  Administered 2021-07-14 – 2021-07-15 (×2): 81 mg via ORAL
  Filled 2021-07-14 (×2): qty 1

## 2021-07-14 MED ORDER — ABIRATERONE ACETATE 250 MG PO TABS
1000.0000 mg | ORAL_TABLET | Freq: Every day | ORAL | Status: DC
  Administered 2021-07-15: 1000 mg via ORAL

## 2021-07-14 MED ORDER — LORAZEPAM 1 MG PO TABS: 1.0000 mg | ORAL_TABLET | Freq: Every day | ORAL | Status: DC | PRN

## 2021-07-14 MED ORDER — FUROSEMIDE 20 MG PO TABS
20.0000 mg | ORAL_TABLET | Freq: Every day | ORAL | Status: DC
  Administered 2021-07-14 – 2021-07-15 (×2): 20 mg via ORAL
  Filled 2021-07-14 (×2): qty 1

## 2021-07-14 MED ORDER — CITALOPRAM HYDROBROMIDE 20 MG PO TABS
10.0000 mg | ORAL_TABLET | Freq: Every day | ORAL | Status: DC
  Administered 2021-07-14 – 2021-07-15 (×2): 10 mg via ORAL
  Filled 2021-07-14 (×2): qty 0.5
  Filled 2021-07-14: qty 1

## 2021-07-14 MED ORDER — PANTOPRAZOLE SODIUM 40 MG PO TBEC
40.0000 mg | DELAYED_RELEASE_TABLET | Freq: Every day | ORAL | Status: DC
  Administered 2021-07-14 – 2021-07-15 (×2): 40 mg via ORAL
  Filled 2021-07-14 (×2): qty 1

## 2021-07-14 MED ORDER — PREDNISONE 20 MG PO TABS
40.0000 mg | ORAL_TABLET | Freq: Every day | ORAL | Status: DC
  Administered 2021-07-14 – 2021-07-15 (×2): 40 mg via ORAL
  Filled 2021-07-14 (×2): qty 2

## 2021-07-14 MED ORDER — POLYVINYL ALCOHOL 1.4 % OP SOLN
1.0000 [drp] | Freq: Every day | OPHTHALMIC | Status: DC | PRN
  Filled 2021-07-14: qty 15

## 2021-07-14 MED ORDER — POLYETHYLENE GLYCOL 3350 17 G PO PACK
17.0000 g | PACK | Freq: Every day | ORAL | Status: DC
  Administered 2021-07-14 – 2021-07-15 (×2): 17 g via ORAL
  Filled 2021-07-14 (×2): qty 1

## 2021-07-14 MED ORDER — DOCUSATE SODIUM 100 MG PO CAPS
100.0000 mg | ORAL_CAPSULE | Freq: Every day | ORAL | Status: DC
  Administered 2021-07-14: 100 mg via ORAL
  Filled 2021-07-14: qty 1

## 2021-07-14 MED ORDER — ARFORMOTEROL TARTRATE 15 MCG/2ML IN NEBU
15.0000 ug | INHALATION_SOLUTION | Freq: Two times a day (BID) | RESPIRATORY_TRACT | Status: DC
  Filled 2021-07-14: qty 2

## 2021-07-14 MED ORDER — PERFLUTREN LIPID MICROSPHERE
1.0000 mL | INTRAVENOUS | Status: AC | PRN
  Administered 2021-07-14: 2 mL via INTRAVENOUS
  Filled 2021-07-14: qty 10

## 2021-07-14 MED ORDER — AZITHROMYCIN 250 MG PO TABS
500.0000 mg | ORAL_TABLET | Freq: Every day | ORAL | Status: DC
  Administered 2021-07-14 – 2021-07-15 (×2): 500 mg via ORAL
  Filled 2021-07-14 (×2): qty 2

## 2021-07-14 MED ORDER — DICLOFENAC SODIUM 1 % EX GEL
2.0000 g | Freq: Every day | CUTANEOUS | Status: DC | PRN
  Filled 2021-07-14: qty 100

## 2021-07-14 NOTE — Telephone Encounter (Signed)
Called patient with no answer, vm was full unable to leave a message.  L.Maylene Crocker,LPN

## 2021-07-14 NOTE — Progress Notes (Signed)
  Echocardiogram 2D Echocardiogram has been performed.  Tony [name] 07/14/2021, 10:59 AM

## 2021-07-14 NOTE — Plan of Care (Signed)
  Problem: Education: Goal: Knowledge of General Education information will improve Description: Including pain rating scale, medication(s)/side effects and non-pharmacologic comfort measures Outcome: Progressing   Problem: Clinical Measurements: Goal: Diagnostic test results will improve Outcome: Progressing Goal: Respiratory complications will improve Outcome: Progressing Goal: Cardiovascular complication will be avoided Outcome: Progressing   Problem: Activity: Goal: Risk for activity intolerance will decrease Outcome: Progressing   Problem: Nutrition: Goal: Adequate nutrition will be maintained Outcome: Progressing   

## 2021-07-14 NOTE — Progress Notes (Signed)
PROGRESS NOTE  Indy Kuck Meckley HYQ:[id number] DOB: 1941-01-19 DOA: 07/13/2021 PCP: [name], [name], MD   LOS: 0 days   Brief Narrative / Interim history: Baylon Santelli is a 80 y.o. male with medical history significant of SSS, COPD, HTN, HLD, chronic hypoxic respiratory failure.  He comes to the hospital with shortness of breath, symptoms started about 3 days ago, also with increased productive cough.  He had no fever or chills.  Complained of weakness, symptoms did not improve and came to the hospital.  Subjective / 24h Interval events: Continues to feel poorly today, coughing up more, mucus and getting short of breath.  Has not been out of bed yet  Assessment & Plan: Principal Problem Acute on chronic hypoxic respiratory failure due to RSV infection, COPD exacerbation-due to productive cough will place on azithromycin, also has some anti-inflammatory properties -Continue DuoNebs, Brovana, Pulmicort -Faint wheezing today, add prednisone  Active Problems Lower extremity swelling, elevated BNP-continue furosemide, leg elevation, 2D echo done showed an EF of 60-65%, no WMA and normal diastolic parameters.  Essential hypertension-he is normotensive this morning, resume metoprolol, continue to hold amlodipine  Hypokalemia -continue to monitor and replete  Scheduled Meds:  abiraterone acetate  1,000 mg Oral Daily   arformoterol  15 mcg Nebulization BID   aspirin EC  81 mg Oral Daily   azithromycin  500 mg Oral Daily   budesonide (PULMICORT) nebulizer solution  0.25 mg Nebulization BID   citalopram  10 mg Oral Daily   docusate sodium  100 mg Oral Q supper   enoxaparin (LOVENOX) injection  40 mg Subcutaneous Q24H   furosemide  20 mg Oral Daily   guaiFENesin  600 mg Oral BID   metoprolol tartrate  25 mg Oral BID   pantoprazole  40 mg Oral Daily   polyethylene glycol  17 g Oral Daily   predniSONE  40 mg Oral Q breakfast   Continuous Infusions:  sodium chloride 10 mL/hr  at 07/13/21 1036   PRN Meds:.sodium chloride, acetaminophen **OR** acetaminophen, albuterol, diclofenac Sodium, LORazepam, polyvinyl alcohol  Diet Orders (From admission, onward)     Start     Ordered   07/13/21 1414  Diet Heart Room service appropriate? Yes; Fluid consistency: Thin  Diet effective now       Question Answer Comment  Room service appropriate? Yes   Fluid consistency: Thin      07/13/21 1413            DVT prophylaxis: enoxaparin (LOVENOX) injection 40 mg Start: 07/13/21 1745     Code Status: Full Code  Family Communication: wife at bedside   Status is: Observation  The patient will require care spanning > 2 midnights and should be moved to inpatient because: Persistent symptoms, dyspnea  Level of care: Telemetry  Consultants:  None   Procedures:  2D echo  Microbiology  none  Antimicrobials: Azithromycin 10/31 >>    Objective: Vitals:   07/14/21 0228 07/14/21 0400 07/14/21 0422 07/14/21 1239  BP: 130/88  119/82 128/82  Pulse: 90  71 71  Resp: 18  16 16   Temp: 98.4 F (36.9 C)  98.3 F (36.8 C) 99.6 F (37.6 C)  TempSrc: Oral  Oral Oral  SpO2: 97%  92% 92%  Weight:  103 kg    Height:        Intake/Output Summary (Last 24 hours) at 07/14/2021 1338 Last data filed at 07/13/2021 1729 Gross per 24 hour  Intake 588.01 ml  Output --  Net 588.01 ml   Filed Weights   07/13/21 1921 07/14/21 0400  Weight: 102 kg 103 kg    Examination:  Constitutional: NAD Eyes: no scleral icterus ENMT: Mucous membranes are moist.  Neck: normal, supple Respiratory: clear to auscultation bilaterally, no wheezing, no crackles.   Cardiovascular: Regular rate and rhythm, no murmurs / rubs / gallops.  Abdomen: non distended, no tenderness. Bowel sounds positive.  Musculoskeletal: no clubbing / cyanosis.  Skin: no rashes Neurologic: CN 2-12 grossly intact. Strength 5/5 in all 4.   Data Reviewed: I have independently reviewed following labs and  imaging studies   CBC: Recent Labs  Lab 07/13/21 0901 07/13/21 0903 07/13/21 1440 07/14/21 0411  WBC 6.5  --   --  5.7  NEUTROABS 4.9  --   --   --   HGB 13.1 13.3 13.3 13.1  HCT 39.2 39.0 39.0 40.3  MCV 91.6  --   --  94.6  PLT 148*  --   --  657*   Basic Metabolic Panel: Recent Labs  Lab 07/13/21 0901 07/13/21 0903 07/13/21 1002 07/13/21 1440 07/14/21 0411  NA 140 141  --  144 139  K 3.0* 3.0*  --  3.8 3.4*  CL 102  --   --   --  105  CO2 26  --   --   --  26  GLUCOSE 113*  --   --   --  110*  BUN 14  --   --   --  14  CREATININE 1.07  --   --   --  0.88  CALCIUM 8.1*  --   --   --  8.7*  MG  --   --  1.8  --   --    Liver Function Tests: Recent Labs  Lab 07/13/21 0901 07/14/21 0411  AST 16 23  ALT 12 15  ALKPHOS 66 66  BILITOT 0.7 0.5  PROT 6.1* 6.3*  ALBUMIN 3.6 3.6   Coagulation Profile: No results for input(s): INR, PROTIME in the last 168 hours. HbA1C: No results for input(s): HGBA1C in the last 72 hours. CBG: No results for input(s): GLUCAP in the last 168 hours.  Recent Results (from the past 240 hour(s))  Resp Panel by RT-PCR (Flu A&B, Covid) Nasopharyngeal Swab     Status: None   Collection Time: 07/13/21  8:19 AM   Specimen: Nasopharyngeal Swab; Nasopharyngeal(NP) swabs in vial transport medium  Result Value Ref Range Status   SARS Coronavirus 2 by RT PCR NEGATIVE NEGATIVE Final    Comment: (NOTE) SARS-CoV-2 target nucleic acids are NOT DETECTED.  The SARS-CoV-2 RNA is generally detectable in upper respiratory specimens during the acute phase of infection. The lowest concentration of SARS-CoV-2 viral copies this assay can detect is 138 copies/mL. A negative result does not preclude SARS-Cov-2 infection and should not be used as the sole basis for treatment or other patient management decisions. A negative result may occur with  improper specimen collection/handling, submission of specimen other than nasopharyngeal swab, presence of  viral mutation(s) within the areas targeted by this assay, and inadequate number of viral copies(<138 copies/mL). A negative result must be combined with clinical observations, patient history, and epidemiological information. The expected result is Negative.  Fact Sheet for Patients:  EntrepreneurPulse.com.au  Fact Sheet for Healthcare Providers:  IncredibleEmployment.be  This test is no t yet approved or cleared by the [place] FDA and  has been authorized for detection and/or diagnosis of SARS-CoV-2  by FDA under an Emergency Use Authorization (EUA). This EUA will remain  in effect (meaning this test can be used) for the duration of the COVID-19 declaration under Section 564(b)(1) of the Act, 21 U.S.C.section 360bbb-3(b)(1), unless the authorization is terminated  or revoked sooner.       Influenza A by PCR NEGATIVE NEGATIVE Final   Influenza B by PCR NEGATIVE NEGATIVE Final    Comment: (NOTE) The Xpert Xpress SARS-CoV-2/FLU/RSV plus assay is intended as an aid in the diagnosis of influenza from Nasopharyngeal swab specimens and should not be used as a sole basis for treatment. Nasal washings and aspirates are unacceptable for Xpert Xpress SARS-CoV-2/FLU/RSV testing.  Fact Sheet for Patients: EntrepreneurPulse.com.au  Fact Sheet for Healthcare Providers: IncredibleEmployment.be  This test is not yet approved or cleared by the [place] FDA and has been authorized for detection and/or diagnosis of SARS-CoV-2 by FDA under an Emergency Use Authorization (EUA). This EUA will remain in effect (meaning this test can be used) for the duration of the COVID-19 declaration under Section 564(b)(1) of the Act, 21 U.S.C. section 360bbb-3(b)(1), unless the authorization is terminated or revoked.  Performed at [facility], [address] [place], [place] [address]   Resp panel by  RT-PCR (RSV, Flu A&B, Covid)     Status: Abnormal   Collection Time: 07/13/21  8:19 AM  Result Value Ref Range Status   SARS Coronavirus 2 by RT PCR NEGATIVE NEGATIVE Final    Comment: (NOTE) SARS-CoV-2 target nucleic acids are NOT DETECTED.  The SARS-CoV-2 RNA is generally detectable in upper respiratory specimens during the acute phase of infection. The lowest concentration of SARS-CoV-2 viral copies this assay can detect is 138 copies/mL. A negative result does not preclude SARS-Cov-2 infection and should not be used as the sole basis for treatment or other patient management decisions. A negative result may occur with  improper specimen collection/handling, submission of specimen other than nasopharyngeal swab, presence of viral mutation(s) within the areas targeted by this assay, and inadequate number of viral copies(<138 copies/mL). A negative result must be combined with clinical observations, patient history, and epidemiological information. The expected result is Negative.  Fact Sheet for Patients:  EntrepreneurPulse.com.au  Fact Sheet for Healthcare Providers:  IncredibleEmployment.be  This test is no t yet approved or cleared by the [place] FDA and  has been authorized for detection and/or diagnosis of SARS-CoV-2 by FDA under an Emergency Use Authorization (EUA). This EUA will remain  in effect (meaning this test can be used) for the duration of the COVID-19 declaration under Section 564(b)(1) of the Act, 21 U.S.C.section 360bbb-3(b)(1), unless the authorization is terminated  or revoked sooner.       Influenza A by PCR NEGATIVE NEGATIVE Final   Influenza B by PCR NEGATIVE NEGATIVE Final    Comment: (NOTE) The Xpert Xpress SARS-CoV-2/FLU/RSV plus assay is intended as an aid in the diagnosis of influenza from Nasopharyngeal swab specimens and should not be used as a sole basis for treatment. Nasal washings and aspirates are  unacceptable for Xpert Xpress SARS-CoV-2/FLU/RSV testing.  Fact Sheet for Patients: EntrepreneurPulse.com.au  Fact Sheet for Healthcare Providers: IncredibleEmployment.be  This test is not yet approved or cleared by the [place] FDA and has been authorized for detection and/or diagnosis of SARS-CoV-2 by FDA under an Emergency Use Authorization (EUA). This EUA will remain in effect (meaning this test can be used) for the duration of the COVID-19 declaration under Section 564(b)(1) of the Act, 21  U.S.C. section 360bbb-3(b)(1), unless the authorization is terminated or revoked.     Resp Syncytial Virus by PCR POSITIVE (A) NEGATIVE Final    Comment: (NOTE) Fact Sheet for Patients: EntrepreneurPulse.com.au  Fact Sheet for Healthcare Providers: IncredibleEmployment.be  This test is not yet approved or cleared by the [place] FDA and has been authorized for detection and/or diagnosis of SARS-CoV-2 by FDA under an Emergency Use Authorization (EUA). This EUA will remain in effect (meaning this test can be used) for the duration of the COVID-19 declaration under Section 564(b)(1) of the Act, 21 U.S.C. section 360bbb-3(b)(1), unless the authorization is terminated or revoked.  Performed at [facility], [address], [place], [place] [address]   Culture, blood (single)     Status: None (Preliminary result)   Collection Time: 07/13/21  9:01 AM   Specimen: BLOOD  Result Value Ref Range Status   Specimen Description   Final    BLOOD BLOOD RIGHT FOREARM Performed at [facility], [address], [place], [place] [address]    Special Requests   Final    BROWN AND CLOUDY Blood Culture adequate volume Performed at [facility], [address], [place], [place] [address]    Culture   Final    NO GROWTH < 24 HOURS Performed at [facility], [place] [address] [place], [place] [address]    Report Status PENDING  Incomplete     Radiology Studies: ECHOCARDIOGRAM COMPLETE  Result Date: 07/14/2021    ECHOCARDIOGRAM REPORT   Patient Name:   STAVROS CAIL Date of Exam: 07/14/2021 Medical Rec #:  [id number]            Height:       68.0 in Accession #:    [id number]           Weight:       227.0 lb Date of Birth:  [birth year]           BSA:          2.157 m Patient Age:    64 years             BP:           119/82 mmHg Patient Gender: M                    HR:           70 bpm. Exam Location:  Inpatient Procedure: 2D Echo, Cardiac Doppler, Color Doppler and Intracardiac            Opacification Agent Indications:    CHF  History:        Patient has prior history of Echocardiogram examinations, most                 recent 07/19/2020. COPD; Risk Factors:Hypertension.  Sonographer:    Bernadene Person RDCS Referring Phys: [id number] [place]  1. Left ventricular ejection fraction, by estimation, is 60 to 65%. The left ventricle has normal function. The left ventricle has no regional wall motion abnormalities. Left ventricular diastolic parameters were normal.  2. Right ventricular systolic function is normal. The right ventricular size is normal.  3. The mitral valve is normal in structure. No evidence of mitral valve regurgitation. No evidence of mitral stenosis.  4. The aortic valve is calcified. Aortic valve regurgitation is trivial. Mild aortic valve sclerosis is present, with no evidence of aortic valve stenosis.  5. Aortic dilatation noted.  There is mild dilatation of the aortic root, measuring 40 mm. There is mild dilatation of the ascending aorta, measuring 43 mm.  6. The inferior vena cava is normal in size with greater than 50% respiratory variability, suggesting right atrial pressure of 3 mmHg. FINDINGS  Left Ventricle: Left ventricular ejection fraction, by estimation, is 60 to 65%. The left ventricle has normal function. The  left ventricle has no regional wall motion abnormalities. Definity contrast agent was given IV to delineate the left ventricular  endocardial borders. The left ventricular internal cavity size was normal in size. There is no left ventricular hypertrophy. Left ventricular diastolic parameters were normal. Normal left ventricular filling pressure. Right Ventricle: The right ventricular size is normal. No increase in right ventricular wall thickness. Right ventricular systolic function is normal. Left Atrium: Left atrial size was normal in size. Right Atrium: Right atrial size was normal in size. Pericardium: There is no evidence of pericardial effusion. Mitral Valve: The mitral valve is normal in structure. No evidence of mitral valve regurgitation. No evidence of mitral valve stenosis. Tricuspid Valve: The tricuspid valve is normal in structure. Tricuspid valve regurgitation is not demonstrated. No evidence of tricuspid stenosis. Aortic Valve: The aortic valve is calcified. Aortic valve regurgitation is trivial. Mild aortic valve sclerosis is present, with no evidence of aortic valve stenosis. Pulmonic Valve: The pulmonic valve was normal in structure. Pulmonic valve regurgitation is not visualized. No evidence of pulmonic stenosis. Aorta: Aortic dilatation noted. There is mild dilatation of the aortic root, measuring 40 mm. There is mild dilatation of the ascending aorta, measuring 43 mm. Venous: The inferior vena cava is normal in size with greater than 50% respiratory variability, suggesting right atrial pressure of 3 mmHg. IAS/Shunts: No atrial level shunt detected by color flow Doppler.  LEFT VENTRICLE PLAX 2D LVIDd:         5.10 cm   Diastology LVIDs:         3.50 cm   LV e' medial:    9.38 cm/s LV PW:         1.00 cm   LV E/e' medial:  10.4 LV IVS:        1.00 cm   LV e' lateral:   10.60 cm/s LVOT diam:     2.20 cm   LV E/e' lateral: 9.2 LV SV:         89 LV SV Index:   41 LVOT Area:     3.80 cm  RIGHT  VENTRICLE RV S prime:     11.80 cm/s TAPSE (M-mode): 2.0 cm LEFT ATRIUM             Index        RIGHT ATRIUM           Index LA diam:        4.30 cm 1.99 cm/m   RA Area:     18.30 cm LA Vol (A2C):   49.9 ml 23.14 ml/m  RA Volume:   48.60 ml  22.53 ml/m LA Vol (A4C):   50.0 ml 23.18 ml/m LA Biplane Vol: 50.7 ml 23.51 ml/m  AORTIC VALVE LVOT Vmax:   120.00 cm/s LVOT Vmean:  81.100 cm/s LVOT VTI:    0.233 m  AORTA Ao Root diam: 4.00 cm Ao Asc diam:  4.30 cm MITRAL VALVE MV Area (PHT): 4.39 cm    SHUNTS MV Decel Time: 173 msec    Systemic VTI:  0.23 m MV E velocity: 98.00 cm/s  Systemic  Diam: 2.20 cm MV A velocity: 83.90 cm/s MV E/A ratio:  1.17 Fransico Him MD Electronically signed by Fransico Him MD Signature Date/Time: 07/14/2021/11:35:16 AM    Final      Marzetta Board, MD, PhD [facility]  Between 7 am - 7 pm I am available, please contact me via Amion (for emergencies) or Securechat (non urgent messages)  Between 7 pm - 7 am I am not available, please contact night coverage MD/APP via Amion

## 2021-07-15 DIAGNOSIS — G4733 Obstructive sleep apnea (adult) (pediatric): Secondary | ICD-10-CM | POA: Diagnosis not present

## 2021-07-15 DIAGNOSIS — R0902 Hypoxemia: Secondary | ICD-10-CM

## 2021-07-15 DIAGNOSIS — J441 Chronic obstructive pulmonary disease with (acute) exacerbation: Secondary | ICD-10-CM | POA: Diagnosis not present

## 2021-07-15 DIAGNOSIS — R269 Unspecified abnormalities of gait and mobility: Secondary | ICD-10-CM | POA: Diagnosis not present

## 2021-07-15 DIAGNOSIS — R0602 Shortness of breath: Secondary | ICD-10-CM | POA: Diagnosis not present

## 2021-07-15 DIAGNOSIS — E876 Hypokalemia: Secondary | ICD-10-CM | POA: Diagnosis not present

## 2021-07-15 MED ORDER — PREDNISONE 20 MG PO TABS: ORAL_TABLET | ORAL | 0 refills | Status: AC

## 2021-07-15 MED ORDER — AZITHROMYCIN 250 MG PO TABS: 500.0000 mg | ORAL_TABLET | Freq: Every day | ORAL | 0 refills | Status: DC

## 2021-07-15 NOTE — Discharge Summary (Signed)
Physician Discharge Summary  Tony [name] AST:[id number] DOB: [birth year] DOA: 07/13/2021  PCP: [name], [name], MD  Admit date: 07/13/2021 Discharge date: 07/15/2021  Admitted From: home Disposition:  home  Recommendations for Outpatient Follow-up:  Follow up with PCP in 1-2 weeks  Home Health: none Equipment/Devices: none  Discharge Condition: stable CODE STATUS: Full code Diet recommendation: heart healthy  HPI: Per admitting MD, Tony [name] is a 80 y.o. male with medical history significant of SSS, COPD, HTN, HLD, chronic hypoxic respiratory failure. Presenting with dyspnea. His symptoms started 3 days ago. He had increased cough. It was productive. He did not notice a fever or chills. He states that he felt tight this morning with his cough and generally ill. He tried OTC cough meds and increasing his oxygen, but it didn't help. Since his symptoms did not improve, he decided to come to the ED.   Hospital Course / Discharge diagnoses: Principal Problem Acute on chronic hypoxic respiratory failure due to RSV infection, COPD exacerbation-patient was admitted to the hospital with COPD exacerbation in the setting of RSV infection.  He also has had some productive cough.  He was treated with azithromycin, nebulizers, steroids with improvement, he is currently at baseline, able to ambulate and will be discharged home in stable condition.  He will have a prednisone taper, 3 more days of azithromycin and he is to continue his home inhalers.  His wheezing has resolved   Active Problems Lower extremity swelling, elevated BNP-continue furosemide, leg elevation, 2D echo done showed an EF of 60-65%, no WMA and normal diastolic parameters.  He was counseled for low-sodium diet Essential hypertension-resume home medications Hypokalemia -repleted  Sepsis ruled out   Discharge Instructions   Allergies as of 07/15/2021       Reactions   Penicillins Rash   Has patient had  a PCN reaction causing immediate rash, facial/tongue/throat swelling, SOB or lightheadedness with hypotension: Yes Has patient had a PCN reaction causing severe rash involving mucus membranes or skin necrosis: No Has patient had a PCN reaction that required hospitalization: No Has patient had a PCN reaction occurring within the last 10 years: No If all of the above answers are "NO", then may proceed with Cephalosporin use.        Medication List     TAKE these medications    abiraterone acetate 250 MG tablet Commonly known as: ZYTIGA TAKE 4 TABLETS (1,000 MG TOTAL) BY MOUTH DAILY. TAKE ON AN EMPTY STOMACH 1 HOUR BEFORE OR 2 HOURS AFTER A MEAL What changed: how much to take   acetaminophen 500 MG tablet Commonly known as: TYLENOL Take 1,000 mg by mouth every 6 (six) hours as needed for headache or moderate pain (pain).   albuterol 108 (90 Base) MCG/ACT inhaler Commonly known as: VENTOLIN HFA TAKE 2 PUFFS BY MOUTH EVERY 6 HOURS AS NEEDED FOR WHEEZE OR SHORTNESS OF BREATH What changed:  how much to take how to take this when to take this reasons to take this additional instructions   amLODipine 2.5 MG tablet Commonly known as: NORVASC TAKE 1 TABLET BY MOUTH EVERY DAY   aspirin EC 81 MG tablet Take 81 mg by mouth daily.   atorvastatin 20 MG tablet Commonly known as: LIPITOR TAKE 1 TABLET BY MOUTH EVERY DAY What changed: how much to take   azithromycin 250 MG tablet Commonly known as: ZITHROMAX Take 2 tablets (500 mg total) by mouth daily.   benzonatate 200 MG capsule Commonly known as:  TESSALON Take 1 capsule (200 mg total) by mouth daily as needed for cough. What changed: when to take this   budesonide-formoterol 160-4.5 MCG/ACT inhaler Commonly known as: Symbicort Inhale 2 puffs then rinse mouth, twice daily What changed:  how much to take how to take this when to take this   carbamide peroxide 6.5 % OTIC solution Commonly known as: DEBROX Place 5 drops  into both ears daily as needed (for earwax).   citalopram 10 MG tablet Commonly known as: CELEXA TAKE 1 TABLET BY MOUTH EVERY DAY   diclofenac Sodium 1 % Gel Commonly known as: VOLTAREN AAPLY 4 GRAMS TOPICALLY 4 TIMES DAILY What changed: See the new instructions.   docusate sodium 100 MG capsule Commonly known as: COLACE Take 100 mg by mouth daily with supper.   fluticasone 50 MCG/ACT nasal spray Commonly known as: FLONASE PLACE 1 SPRAY INTO BOTH NOSTRILS DAILY AS NEEDED FOR ALLERGIES OR RHINITIS (AT BEDTIME). What changed:  when to take this reasons to take this   furosemide 20 MG tablet Commonly known as: LASIX TAKE 1 TABLET BY MOUITH DAILY FOR 5 DAYS , THEN DAILY AS NEEDED FOR EDEMA What changed: See the new instructions.   LORazepam 1 MG tablet Commonly known as: ATIVAN TAKE 1 TABLET BY MOUTH TWICE A DAY AS NEEDED. DNFB 6/18 What changed: See the new instructions.   metoprolol tartrate 25 MG tablet Commonly known as: LOPRESSOR TAKE 1 TABLET BY MOUTH TWICE A DAY   multivitamin with minerals Tabs tablet Take 0.5 tablets by mouth daily. Centrum   omeprazole 20 MG capsule Commonly known as: PRILOSEC TAKE 1 CAPSULE (20 MG TOTAL) BY MOUTH DAILY BEFORE BREAKFAST.   predniSONE 5 MG tablet Commonly known as: DELTASONE TAKE 1 TABLET BY MOUTH EVERY DAY WITH BREAKFAST What changed: See the new instructions.   predniSONE 20 MG tablet Commonly known as: DELTASONE Take 2 tablets (40 mg total) by mouth daily with breakfast for 2 days, THEN 1 tablet (20 mg total) daily with breakfast for 2 days. Start taking on: July 16, 2021 What changed: You were already taking a medication with the same name, and this prescription was added. Make sure you understand how and when to take each.   VISINE OP Place 1 drop into both eyes daily as needed (red eyes).   VITAMIN C PO Take 1 tablet by mouth daily.       Consultations: none  Procedures/Studies:  DG Chest Portable 1  View  Result Date: 07/13/2021 CLINICAL DATA:  Productive cough. EXAM: PORTABLE CHEST 1 VIEW COMPARISON:  July 18, 2020. FINDINGS: Stable cardiomegaly. Left-sided pacemaker is unchanged in position. No pneumothorax or pleural effusion is noted. Minimal reticular densities are noted in the perihilar and basilar regions bilaterally which may represent scarring or atelectasis, but acute superimposed edema or atypical inflammation cannot be excluded. Bony thorax is unremarkable. IMPRESSION: Minimal reticular densities are noted in the perihilar and basilar regions bilaterally which may represent scarring or atelectasis, but acute superimposed edema or atypical inflammation cannot be excluded. Electronically Signed   By: Marijo Conception M.D.   On: 07/13/2021 09:12   ECHOCARDIOGRAM COMPLETE  Result Date: 07/14/2021    ECHOCARDIOGRAM REPORT   Patient Name:   Tony [name] Date of Exam: 07/14/2021 Medical Rec #:  [id number]            Height:       68.0 in Accession #:    [id number]  Weight:       227.0 lb Date of Birth:  1940-09-24           BSA:          2.157 m Patient Age:    26 years             BP:           119/82 mmHg Patient Gender: M                    HR:           70 bpm. Exam Location:  Inpatient Procedure: 2D Echo, Cardiac Doppler, Color Doppler and Intracardiac            Opacification Agent Indications:    CHF  History:        Patient has prior history of Echocardiogram examinations, most                 recent 07/19/2020. COPD; Risk Factors:Hypertension.  Sonographer:    Bernadene Person RDCS Referring Phys: [id number] [place]  1. Left ventricular ejection fraction, by estimation, is 60 to 65%. The left ventricle has normal function. The left ventricle has no regional wall motion abnormalities. Left ventricular diastolic parameters were normal.  2. Right ventricular systolic function is normal. The right ventricular size is normal.  3. The mitral valve is normal in  structure. No evidence of mitral valve regurgitation. No evidence of mitral stenosis.  4. The aortic valve is calcified. Aortic valve regurgitation is trivial. Mild aortic valve sclerosis is present, with no evidence of aortic valve stenosis.  5. Aortic dilatation noted. There is mild dilatation of the aortic root, measuring 40 mm. There is mild dilatation of the ascending aorta, measuring 43 mm.  6. The inferior vena cava is normal in size with greater than 50% respiratory variability, suggesting right atrial pressure of 3 mmHg. FINDINGS  Left Ventricle: Left ventricular ejection fraction, by estimation, is 60 to 65%. The left ventricle has normal function. The left ventricle has no regional wall motion abnormalities. Definity contrast agent was given IV to delineate the left ventricular  endocardial borders. The left ventricular internal cavity size was normal in size. There is no left ventricular hypertrophy. Left ventricular diastolic parameters were normal. Normal left ventricular filling pressure. Right Ventricle: The right ventricular size is normal. No increase in right ventricular wall thickness. Right ventricular systolic function is normal. Left Atrium: Left atrial size was normal in size. Right Atrium: Right atrial size was normal in size. Pericardium: There is no evidence of pericardial effusion. Mitral Valve: The mitral valve is normal in structure. No evidence of mitral valve regurgitation. No evidence of mitral valve stenosis. Tricuspid Valve: The tricuspid valve is normal in structure. Tricuspid valve regurgitation is not demonstrated. No evidence of tricuspid stenosis. Aortic Valve: The aortic valve is calcified. Aortic valve regurgitation is trivial. Mild aortic valve sclerosis is present, with no evidence of aortic valve stenosis. Pulmonic Valve: The pulmonic valve was normal in structure. Pulmonic valve regurgitation is not visualized. No evidence of pulmonic stenosis. Aorta: Aortic dilatation  noted. There is mild dilatation of the aortic root, measuring 40 mm. There is mild dilatation of the ascending aorta, measuring 43 mm. Venous: The inferior vena cava is normal in size with greater than 50% respiratory variability, suggesting right atrial pressure of 3 mmHg. IAS/Shunts: No atrial level shunt detected by color flow Doppler.  LEFT VENTRICLE PLAX 2D LVIDd:  5.10 cm   Diastology LVIDs:         3.50 cm   LV e' medial:    9.38 cm/s LV PW:         1.00 cm   LV E/e' medial:  10.4 LV IVS:        1.00 cm   LV e' lateral:   10.60 cm/s LVOT diam:     2.20 cm   LV E/e' lateral: 9.2 LV SV:         89 LV SV Index:   41 LVOT Area:     3.80 cm  RIGHT VENTRICLE RV S prime:     11.80 cm/s TAPSE (M-mode): 2.0 cm LEFT ATRIUM             Index        RIGHT ATRIUM           Index LA diam:        4.30 cm 1.99 cm/m   RA Area:     18.30 cm LA Vol (A2C):   49.9 ml 23.14 ml/m  RA Volume:   48.60 ml  22.53 ml/m LA Vol (A4C):   50.0 ml 23.18 ml/m LA Biplane Vol: 50.7 ml 23.51 ml/m  AORTIC VALVE LVOT Vmax:   120.00 cm/s LVOT Vmean:  81.100 cm/s LVOT VTI:    0.233 m  AORTA Ao Root diam: 4.00 cm Ao Asc diam:  4.30 cm MITRAL VALVE MV Area (PHT): 4.39 cm    SHUNTS MV Decel Time: 173 msec    Systemic VTI:  0.23 m MV E velocity: 98.00 cm/s  Systemic Diam: 2.20 cm MV A velocity: 83.90 cm/s MV E/A ratio:  1.17 Fransico Him MD Electronically signed by Fransico Him MD Signature Date/Time: 07/14/2021/11:35:16 AM    Final    CUP PACEART REMOTE DEVICE CHECK  Result Date: 06/17/2021 Scheduled remote reviewed. Normal device function.  1 NSVT, markers suggest regular, tachy, 1:1 Next remote 91 days. LR    Subjective: - no chest pain, shortness of breath, no abdominal pain, nausea or vomiting.   Discharge Exam: BP (!) 139/99 (BP Location: Right Arm)   Pulse 70   Temp 97.7 F (36.5 C) (Oral)   Resp 18   Ht 5\' 8"  (1.727 m)   Wt 103 kg   SpO2 94%   BMI 34.52 kg/m   General: Pt is alert, awake, not in acute  distress Cardiovascular: RRR, S1/S2 +, no rubs, no gallops Respiratory: CTA bilaterally, no wheezing, no rhonchi Abdominal: Soft, NT, ND, bowel sounds + Extremities: no edema, no cyanosis    The results of significant diagnostics from this hospitalization (including imaging, microbiology, ancillary and laboratory) are listed below for reference.     Microbiology: Recent Results (from the past 240 hour(s))  Resp Panel by RT-PCR (Flu A&B, Covid) Nasopharyngeal Swab     Status: None   Collection Time: 07/13/21  8:19 AM   Specimen: Nasopharyngeal Swab; Nasopharyngeal(NP) swabs in vial transport medium  Result Value Ref Range Status   SARS Coronavirus 2 by RT PCR NEGATIVE NEGATIVE Final    Comment: (NOTE) SARS-CoV-2 target nucleic acids are NOT DETECTED.  The SARS-CoV-2 RNA is generally detectable in upper respiratory specimens during the acute phase of infection. The lowest concentration of SARS-CoV-2 viral copies this assay can detect is 138 copies/mL. A negative result does not preclude SARS-Cov-2 infection and should not be used as the sole basis for treatment or other patient management decisions. A negative result may  occur with  improper specimen collection/handling, submission of specimen other than nasopharyngeal swab, presence of viral mutation(s) within the areas targeted by this assay, and inadequate number of viral copies(<138 copies/mL). A negative result must be combined with clinical observations, patient history, and epidemiological information. The expected result is Negative.  Fact Sheet for Patients:  EntrepreneurPulse.com.au  Fact Sheet for Healthcare Providers:  IncredibleEmployment.be  This test is no t yet approved or cleared by the [place] FDA and  has been authorized for detection and/or diagnosis of SARS-CoV-2 by FDA under an Emergency Use Authorization (EUA). This EUA will remain  in effect (meaning this test  can be used) for the duration of the COVID-19 declaration under Section 564(b)(1) of the Act, 21 U.S.C.section 360bbb-3(b)(1), unless the authorization is terminated  or revoked sooner.       Influenza A by PCR NEGATIVE NEGATIVE Final   Influenza B by PCR NEGATIVE NEGATIVE Final    Comment: (NOTE) The Xpert Xpress SARS-CoV-2/FLU/RSV plus assay is intended as an aid in the diagnosis of influenza from Nasopharyngeal swab specimens and should not be used as a sole basis for treatment. Nasal washings and aspirates are unacceptable for Xpert Xpress SARS-CoV-2/FLU/RSV testing.  Fact Sheet for Patients: EntrepreneurPulse.com.au  Fact Sheet for Healthcare Providers: IncredibleEmployment.be  This test is not yet approved or cleared by the [place] FDA and has been authorized for detection and/or diagnosis of SARS-CoV-2 by FDA under an Emergency Use Authorization (EUA). This EUA will remain in effect (meaning this test can be used) for the duration of the COVID-19 declaration under Section 564(b)(1) of the Act, 21 U.S.C. section 360bbb-3(b)(1), unless the authorization is terminated or revoked.  Performed at [facility], [address], [place], [place] [address]   Resp panel by RT-PCR (RSV, Flu A&B, Covid)     Status: Abnormal   Collection Time: 07/13/21  8:19 AM  Result Value Ref Range Status   SARS Coronavirus 2 by RT PCR NEGATIVE NEGATIVE Final    Comment: (NOTE) SARS-CoV-2 target nucleic acids are NOT DETECTED.  The SARS-CoV-2 RNA is generally detectable in upper respiratory specimens during the acute phase of infection. The lowest concentration of SARS-CoV-2 viral copies this assay can detect is 138 copies/mL. A negative result does not preclude SARS-Cov-2 infection and should not be used as the sole basis for treatment or other patient management decisions. A negative result may occur with  improper specimen  collection/handling, submission of specimen other than nasopharyngeal swab, presence of viral mutation(s) within the areas targeted by this assay, and inadequate number of viral copies(<138 copies/mL). A negative result must be combined with clinical observations, patient history, and epidemiological information. The expected result is Negative.  Fact Sheet for Patients:  EntrepreneurPulse.com.au  Fact Sheet for Healthcare Providers:  IncredibleEmployment.be  This test is no t yet approved or cleared by the [place] FDA and  has been authorized for detection and/or diagnosis of SARS-CoV-2 by FDA under an Emergency Use Authorization (EUA). This EUA will remain  in effect (meaning this test can be used) for the duration of the COVID-19 declaration under Section 564(b)(1) of the Act, 21 U.S.C.section 360bbb-3(b)(1), unless the authorization is terminated  or revoked sooner.       Influenza A by PCR NEGATIVE NEGATIVE Final   Influenza B by PCR NEGATIVE NEGATIVE Final    Comment: (NOTE) The Xpert Xpress SARS-CoV-2/FLU/RSV plus assay is intended as an aid in the diagnosis of influenza from Nasopharyngeal swab specimens and should  not be used as a sole basis for treatment. Nasal washings and aspirates are unacceptable for Xpert Xpress SARS-CoV-2/FLU/RSV testing.  Fact Sheet for Patients: EntrepreneurPulse.com.au  Fact Sheet for Healthcare Providers: IncredibleEmployment.be  This test is not yet approved or cleared by the [place] FDA and has been authorized for detection and/or diagnosis of SARS-CoV-2 by FDA under an Emergency Use Authorization (EUA). This EUA will remain in effect (meaning this test can be used) for the duration of the COVID-19 declaration under Section 564(b)(1) of the Act, 21 U.S.C. section 360bbb-3(b)(1), unless the authorization is terminated or revoked.     Resp Syncytial  Virus by PCR POSITIVE (A) NEGATIVE Final    Comment: (NOTE) Fact Sheet for Patients: EntrepreneurPulse.com.au  Fact Sheet for Healthcare Providers: IncredibleEmployment.be  This test is not yet approved or cleared by the [place] FDA and has been authorized for detection and/or diagnosis of SARS-CoV-2 by FDA under an Emergency Use Authorization (EUA). This EUA will remain in effect (meaning this test can be used) for the duration of the COVID-19 declaration under Section 564(b)(1) of the Act, 21 U.S.C. section 360bbb-3(b)(1), unless the authorization is terminated or revoked.  Performed at [facility], [address], [place], [place] [address]   Culture, blood (single)     Status: None (Preliminary result)   Collection Time: 07/13/21  9:01 AM   Specimen: BLOOD  Result Value Ref Range Status   Specimen Description   Final    BLOOD BLOOD RIGHT FOREARM Performed at [facility], [address] [place], [place] [address]    Special Requests   Final    BROWN AND CLOUDY Blood Culture adequate volume Performed at [facility], [address], [place], [place] [address]    Culture   Final    NO GROWTH 2 DAYS Performed at [facility], [address], [place], [place] [address]    Report Status PENDING  Incomplete     Labs: Basic Metabolic Panel: Recent Labs  Lab 07/13/21 0901 07/13/21 0903 07/13/21 1002 07/13/21 1440 07/14/21 0411  NA 140 141  --  144 139  K 3.0* 3.0*  --  3.8 3.4*  CL 102  --   --   --  105  CO2 26  --   --   --  26  GLUCOSE 113*  --   --   --  110*  BUN 14  --   --   --  14  CREATININE 1.07  --   --   --  0.88  CALCIUM 8.1*  --   --   --  8.7*  MG  --   --  1.8  --   --    Liver Function Tests: Recent Labs  Lab 07/13/21 0901 07/14/21 0411  AST 16 23  ALT 12 15  ALKPHOS 66 66  BILITOT 0.7 0.5  PROT 6.1* 6.3*  ALBUMIN 3.6 3.6    CBC: Recent Labs  Lab 07/13/21 0901 07/13/21 0903 07/13/21 1440 07/14/21 0411  WBC 6.5  --   --  5.7  NEUTROABS 4.9  --   --   --   HGB 13.1 13.3 13.3 13.1  HCT 39.2 39.0 39.0 40.3  MCV 91.6  --   --  94.6  PLT 148*  --   --  141*   CBG: No results for input(s): GLUCAP in the last 168 hours. Hgb A1c No results for input(s): HGBA1C in the last 72 hours.  Lipid Profile No results for input(s): CHOL, HDL, LDLCALC, TRIG, CHOLHDL, LDLDIRECT in the last 72 hours. Thyroid function studies No results for input(s): TSH, T4TOTAL, T3FREE, THYROIDAB in the last 72 hours.  Invalid input(s): FREET3 Urinalysis    Component Value Date/Time   COLORURINE YELLOW 02/02/2018 2156   APPEARANCEUR CLEAR 02/02/2018 2156   LABSPEC >1.046 (H) 02/02/2018 2156   PHURINE 7.0 02/02/2018 2156   GLUCOSEU NEGATIVE 02/02/2018 2156   HGBUR NEGATIVE 02/02/2018 2156   BILIRUBINUR NEGATIVE 02/02/2018 2156   BILIRUBINUR 1+ 01/21/2018 1019   KETONESUR 20 (A) 02/02/2018 2156   PROTEINUR 30 (A) 02/02/2018 2156   UROBILINOGEN 1.0 01/21/2018 1019   UROBILINOGEN 0.2 06/03/2014 0821   NITRITE NEGATIVE 02/02/2018 2156   LEUKOCYTESUR NEGATIVE 02/02/2018 2156    FURTHER DISCHARGE INSTRUCTIONS:   Get Medicines reviewed and adjusted: Please take all your medications with you for your next visit with your Primary MD   Laboratory/radiological data: Please request your Primary MD to go over all hospital tests and procedure/radiological results at the follow up, please ask your Primary MD to get all Hospital records sent to his/her office.   In some cases, they will be blood work, cultures and biopsy results pending at the time of your discharge. Please request that your primary care M.D. goes through all the records of your hospital data and follows up on these results.   Also Note the following: If you experience worsening of your admission symptoms, develop shortness of breath, life threatening emergency,  suicidal or homicidal thoughts you must seek medical attention immediately by calling 911 or calling your MD immediately  if symptoms less severe.   You must read complete instructions/literature along with all the possible adverse reactions/side effects for all the Medicines you take and that have been prescribed to you. Take any new Medicines after you have completely understood and accpet all the possible adverse reactions/side effects.    Do not drive when taking Pain medications or sleeping medications (Benzodaizepines)   Do not take more than prescribed Pain, Sleep and Anxiety Medications. It is not advisable to combine anxiety,sleep and pain medications without talking with your primary care practitioner   Special Instructions: If you have smoked or chewed Tobacco  in the last 2 yrs please stop smoking, stop any regular Alcohol  and or any Recreational drug use.   Wear Seat belts while driving.   Please note: You were cared for by a hospitalist during your hospital stay. Once you are discharged, your primary care physician will handle any further medical issues. Please note that NO REFILLS for any discharge medications will be authorized once you are discharged, as it is imperative that you return to your primary care physician (or establish a relationship with a primary care physician if you do not have one) for your post hospital discharge needs so that they can reassess your need for medications and monitor your lab values.  Time coordinating discharge: 35 minutes  SIGNED:  Marzetta Board, MD, PhD 07/15/2021, 9:58 AM

## 2021-07-15 NOTE — Evaluation (Signed)
Physical Therapy Evaluation Patient Details Name: Tony [name] MRN: [id number] DOB: 22-Jan-1941 Today's Date: 07/15/2021  History of Present Illness  80 yo male admitted with dyspnea, RSV, COPD exac. Hx of prostate ca, AAA, pacemaker, chronic resp failure  Clinical Impression  On eval, pt was Supv-Min guard assist for mobility. He walked ~165  feet with a RW. O2: 89-90% on RA during session. Pt tolerated activity well. Plan is to return home where he lives with his wife. He has DME already. Will follow during hospital stay.       Recommendations for follow up therapy are one component of a multi-disciplinary discharge planning process, led by the attending physician.  Recommendations may be updated based on patient status, additional functional criteria and insurance authorization.  Follow Up Recommendations No PT follow up (pt/wife politely decline any PT f/u after discharge. Has had HHPT before-states he will follow HEP he already has)    Assistance Recommended at Discharge Intermittent Supervision/Assistance  Functional Status Assessment Patient has had a recent decline in their functional status and demonstrates the ability to make significant improvements in function in a reasonable and predictable amount of time.  Equipment Recommendations  None recommended by PT    Recommendations for Other Services       Precautions / Restrictions Precautions Precautions: Fall Precaution Comments: monitor O2 Restrictions Weight Bearing Restrictions: No      Mobility  Bed Mobility Overal bed mobility: Modified Independent                  Transfers Overall transfer level: Modified independent                      Ambulation/Gait Ambulation/Gait assistance: Min guard/Supervision Gait Distance (Feet): 165 Feet Assistive device: Rolling walker (2 wheels) Gait Pattern/deviations: Step-through pattern;Decreased stride length     General Gait Details: MIn guard  for safety. No overt LOB with RW use. O2 89-90% on RA, dyspnea 2/4.  Stairs            Wheelchair Mobility    Modified Rankin (Stroke Patients Only)       Balance Overall balance assessment: Needs assistance         Standing balance support: Bilateral upper extremity supported Standing balance-Leahy Scale: Fair                               Pertinent Vitals/Pain Pain Assessment: No/denies pain    Home Living Family/patient expects to be discharged to:: Private residence Living Arrangements: Spouse/significant other Available Help at Discharge: Family;Available 24 hours/day Type of Home: House Home Access: Stairs to enter Entrance Stairs-Rails: Right Entrance Stairs-Number of Steps: 2   Home Layout: One level Home Equipment: Conservation officer, nature (2 wheels);Rollator (4 wheels);Cane - single point      Prior Function Prior Level of Function : Independent/Modified Independent             Mobility Comments: uses cane vs walker ADLs Comments: wife helps with household tasks     Hand Dominance        Extremity/Trunk Assessment   Upper Extremity Assessment Upper Extremity Assessment: Overall WFL for tasks assessed    Lower Extremity Assessment Lower Extremity Assessment: Generalized weakness    Cervical / Trunk Assessment Cervical / Trunk Assessment: Normal  Communication   Communication: No difficulties  Cognition Arousal/Alertness: Awake/alert Behavior During Therapy: WFL for tasks assessed/performed Overall Cognitive Status: Within Functional  Limits for tasks assessed                                          General Comments      Exercises     Assessment/Plan    PT Assessment Patient needs continued PT services  PT Problem List Decreased strength;Decreased mobility;Decreased activity tolerance;Decreased balance;Decreased knowledge of use of DME       PT Treatment Interventions DME instruction;Gait  training;Therapeutic activities;Therapeutic exercise;Patient/family education;Balance training;Functional mobility training    PT Goals (Current goals can be found in the Care Plan section)  Acute Rehab PT Goals Patient Stated Goal: home soon PT Goal Formulation: With patient/family Time For Goal Achievement: 07/29/21 Potential to Achieve Goals: Good    Frequency Min 3X/week   Barriers to discharge        Co-evaluation               AM-PAC PT "6 Clicks" Mobility  Outcome Measure Help needed turning from your back to your side while in a flat bed without using bedrails?: None Help needed moving from lying on your back to sitting on the side of a flat bed without using bedrails?: None Help needed moving to and from a bed to a chair (including a wheelchair)?: A Little Help needed standing up from a chair using your arms (e.g., wheelchair or bedside chair)?: A Little Help needed to walk in hospital room?: A Little Help needed climbing 3-5 steps with a railing? : A Little 6 Click Score: 20    End of Session Equipment Utilized During Treatment: Gait belt Activity Tolerance: Patient tolerated treatment well Patient left: in bed;with call bell/phone within reach;with bed alarm set;with family/visitor present   PT Visit Diagnosis: Unsteadiness on feet (R26.81);History of falling (Z91.81);Difficulty in walking, not elsewhere classified (R26.2)    Time: 1610-9604 PT Time Calculation (min) (ACUTE ONLY): 13 min   Charges:   PT Evaluation $PT Eval Moderate Complexity: 1 Mod             Doreatha Massed, PT Acute Rehabilitation  Office: [phone] Pager: [phone]

## 2021-07-15 NOTE — Progress Notes (Signed)
1430- Pt discharged to home with wife, [name]. Pt escorted out to car via w/c by this nurse and Lynde, NS. Pt in nad at time of discharge.

## 2021-07-15 NOTE — Progress Notes (Signed)
Pt taken out by this nurse and nursing student [place].

## 2021-07-16 ENCOUNTER — Other Ambulatory Visit (HOSPITAL_COMMUNITY): Payer: Self-pay

## 2021-07-18 LAB — CULTURE, BLOOD (SINGLE)
Culture: NO GROWTH
Special Requests: ADEQUATE

## 2021-07-19 ENCOUNTER — Other Ambulatory Visit: Payer: Self-pay | Admitting: Oncology

## 2021-07-19 DIAGNOSIS — C61 Malignant neoplasm of prostate: Secondary | ICD-10-CM

## 2021-07-21 ENCOUNTER — Encounter: Payer: Self-pay | Admitting: Oncology

## 2021-07-22 ENCOUNTER — Ambulatory Visit (INDEPENDENT_AMBULATORY_CARE_PROVIDER_SITE_OTHER): Payer: Medicare Other

## 2021-07-22 VITALS — Ht 68.0 in | Wt 227.0 lb

## 2021-07-22 DIAGNOSIS — Z0000 Encounter for general adult medical examination without abnormal findings: Secondary | ICD-10-CM

## 2021-07-22 NOTE — Progress Notes (Signed)
Subjective:   Tony [name] is a 80 y.o. male who presents for Medicare Annual/Subsequent preventive examination.  Review of Systems    No ROS.      Objective:    Today's Vitals   07/22/21 1431  Weight: 227 lb (103 kg)  Height: 5\' 8"  (1.727 m)   Body mass index is 34.52 kg/m.  Advanced Directives 07/22/2021 07/13/2021 07/13/2021 04/28/2021 11/05/2020 07/30/2020 07/19/2020  Does Patient Have a Medical Advance Directive? No No No No No No No  Would patient like information on creating a medical advance directive? No - Patient declined No - Patient declined No - Patient declined No - Patient declined No - Patient declined No - Patient declined No - Patient declined    Current Medications (verified) Outpatient Encounter Medications as of 07/22/2021  Medication Sig   abiraterone acetate (ZYTIGA) 250 MG tablet TAKE 4 TABLETS (1,000 MG TOTAL) BY MOUTH DAILY. TAKE ON AN EMPTY STOMACH 1 HOUR BEFORE OR 2 HOURS AFTER A MEAL (Patient taking differently: Take 1,000 mg by mouth daily. Take on an empty stomach 1 hour before or 2 hours after a meal.)   acetaminophen (TYLENOL) 500 MG tablet Take 1,000 mg by mouth every 6 (six) hours as needed for headache or moderate pain (pain).   albuterol (VENTOLIN HFA) 108 (90 Base) MCG/ACT inhaler TAKE 2 PUFFS BY MOUTH EVERY 6 HOURS AS NEEDED FOR WHEEZE OR SHORTNESS OF BREATH (Patient taking differently: Inhale 2 puffs into the lungs every 6 (six) hours as needed for wheezing or shortness of breath.)   amLODipine (NORVASC) 2.5 MG tablet TAKE 1 TABLET BY MOUTH EVERY DAY (Patient taking differently: Take 2.5 mg by mouth daily.)   Ascorbic Acid (VITAMIN C PO) Take 1 tablet by mouth daily.   aspirin EC 81 MG tablet Take 81 mg by mouth daily.   atorvastatin (LIPITOR) 20 MG tablet TAKE 1 TABLET BY MOUTH EVERY DAY (Patient taking differently: Take 40 mg by mouth daily.)   azithromycin (ZITHROMAX) 250 MG tablet Take 2 tablets (500 mg total) by mouth daily.    budesonide-formoterol (SYMBICORT) 160-4.5 MCG/ACT inhaler Inhale 2 puffs then rinse mouth, twice daily (Patient taking differently: Inhale 2 puffs into the lungs 2 (two) times daily. Inhale 2 puffs then rinse mouth, twice daily)   carbamide peroxide (DEBROX) 6.5 % OTIC solution Place 5 drops into both ears daily as needed (for earwax).   citalopram (CELEXA) 10 MG tablet TAKE 1 TABLET BY MOUTH EVERY DAY (Patient taking differently: Take 10 mg by mouth daily.)   diclofenac Sodium (VOLTAREN) 1 % GEL AAPLY 4 GRAMS TOPICALLY 4 TIMES DAILY (Patient taking differently: Apply 2 g topically daily as needed (pain).)   docusate sodium (COLACE) 100 MG capsule Take 100 mg by mouth daily with supper.   fluticasone (FLONASE) 50 MCG/ACT nasal spray PLACE 1 SPRAY INTO BOTH NOSTRILS DAILY AS NEEDED FOR ALLERGIES OR RHINITIS (AT BEDTIME). (Patient taking differently: Place 1 spray into both nostrils at bedtime as needed for allergies or rhinitis.)   furosemide (LASIX) 20 MG tablet TAKE 1 TABLET BY MOUITH DAILY FOR 5 DAYS , THEN DAILY AS NEEDED FOR EDEMA (Patient taking differently: Take 20 mg by mouth daily.)   LORazepam (ATIVAN) 1 MG tablet TAKE 1 TABLET BY MOUTH TWICE A DAY AS NEEDED. DNFB 6/18 (Patient taking differently: Take 1 mg by mouth daily.)   metoprolol tartrate (LOPRESSOR) 25 MG tablet TAKE 1 TABLET BY MOUTH TWICE A DAY (Patient taking differently: Take 25 mg by  mouth 2 (two) times daily.)   Multiple Vitamin (MULTIVITAMIN WITH MINERALS) TABS tablet Take 0.5 tablets by mouth daily. Centrum   omeprazole (PRILOSEC) 20 MG capsule TAKE 1 CAPSULE (20 MG TOTAL) BY MOUTH DAILY BEFORE BREAKFAST.   predniSONE (DELTASONE) 5 MG tablet TAKE 1 TABLET BY MOUTH EVERY DAY WITH BREAKFAST   Tetrahydrozoline HCl (VISINE OP) Place 1 drop into both eyes daily as needed (red eyes).   No facility-administered encounter medications on file as of 07/22/2021.    Allergies (verified) Penicillins   History: Past Medical History:   Diagnosis Date   COPD (chronic obstructive pulmonary disease) ([place])    Gastric outlet obstruction 02/03/2018   [name] 02/03/2018   GERD (gastroesophageal reflux disease)    History of hiatal hernia    small/notes 02/03/2018   Hypertension    no meds   Presence of permanent cardiac pacemaker    Prostate cancer ([place]) 02/08/2014   Gleason 4+5=9, PSA 15.65   Radiation    Sinus problem    SSS (sick sinus syndrome) ([place])    Past Surgical History:  Procedure Laterality Date   APPENDECTOMY  2019   hx hematoma s/p appendectomy   ESOPHAGOGASTRODUODENOSCOPY (EGD) WITH PROPOFOL N/A 02/03/2018   Procedure: ESOPHAGOGASTRODUODENOSCOPY (EGD) WITH PROPOFOL;  Surgeon: Clarene Essex, MD;  Location: [place] [facility];  Service: Endoscopy;  Laterality: N/A;   FRACTURE SURGERY     INSERT / REPLACE / REMOVE PACEMAKER  06/14/2018   LAPAROSCOPIC APPENDECTOMY N/A 01/21/2018   Procedure: APPENDECTOMY LAPAROSCOPIC;  Surgeon: Kinsinger, Arta Bruce, MD;  Location: [place] [facility];  Service: General;  Laterality: N/A;   PACEMAKER IMPLANT N/A 06/14/2018   Procedure: PACEMAKER IMPLANT - Dual Chamber;  Surgeon: Sanda Klein, MD;  Location: [facility];  Service: Cardiovascular;  Laterality: N/A;   PROSTATE BIOPSY  02/08/14   gleason 4+5=9, 12/12 cores positive, 54 gm   RADIOACTIVE SEED IMPLANT N/A 06/29/2014   Procedure: RADIOACTIVE SEED IMPLANT;  Surgeon: Bernestine Amass, MD;  Location: [facility];  Service: Urology;  Laterality: N/A;   WRIST FRACTURE SURGERY Right 1980s   Family History  Problem Relation Age of Onset   Alzheimer's disease Mother    Alzheimer's disease Father    Cancer Father        prostate   Arthritis Sister    Cancer Brother        prostate   Social History   Socioeconomic History   Marital status: Married    Spouse name: Not on file   Number of children: 3   Years of education: Not on file   Highest education level: Not on file  Occupational History   Occupation: Forensic psychologist    Employer: [employer]    Comment: retired   Occupation: Freight forwarder    Comment: [employer]-retired  Tobacco Use   Smoking status: Former    Types: Cigars    Quit date: 09/14/2012    Years since quitting: 8.8   Smokeless tobacco: Never   Tobacco comments:    little cigars; the small ones"  Vaping Use   Vaping Use: Never used  Substance and Sexual Activity   Alcohol use: Yes    Alcohol/week: 14.0 standard drinks    Types: 14 Cans of beer per week   Drug use: No   Sexual activity: Not Currently  Other Topics Concern   Not on file  Social History Narrative   Lives with wife and 2 dogs in one level home; daughter and her family  co-habitate.   Has three daughters, all in [place], supportive. Five grandchildren.   Wants to return to doing silver sneakers once gym re-opens.         Social Determinants of Health   Financial Resource Strain: Low Risk    Difficulty of Paying Living Expenses: Not hard at all  Food Insecurity: No Food Insecurity   Worried About Charity fundraiser in the Last Year: Never true   [place] in the Last Year: Never true  Transportation Needs: No Transportation Needs   Lack of Transportation (Medical): No   Lack of Transportation (Non-Medical): No  Physical Activity: Insufficiently Active   Days of Exercise per Week: 5 days   Minutes of Exercise per Session: 20 min  Stress: No Stress Concern Present   Feeling of Stress : Not at all  Social Connections: Moderately Integrated   Frequency of Communication with Friends and Family: More than three times a week   Frequency of Social Gatherings with Friends and Family: More than three times a week   Attends Religious Services: More than 4 times per year   Active Member of [employer] or Organizations: No   Attends Music therapist: Not on file   Marital Status: Married   Clinical Intake:  Pre-visit preparation completed: Yes   Diabetic? No.  Interpreter Needed?: No  Activities of Daily  Living In your present state of health, do you have any difficulty performing the following activities: 07/22/2021 07/13/2021  Hearing? N N  Vision? N N  Difficulty concentrating or making decisions? N N  Walking or climbing stairs? N Y  Dressing or bathing? N Y  Doing errands, shopping? N N  Comment Wife assist -  Conservation officer, nature and eating ? N -  Comment Wife assist. Feeds self. -  Using the Toilet? N -  In the past six months, have you accidently leaked urine? N -  Do you have problems with loss of bowel control? N -  Managing your Finances? N -  Housekeeping or managing your Housekeeping? N -  Comment Wife assist -  Some recent data might be hidden    Patient Care Team: [name], [name], MD as PCP - General (Family Medicine) Croitoru, Dani Gobble, MD as PCP - Cardiology (Cardiology) Wyatt Portela, MD as Consulting Physician (Oncology) Viona Gilmore, [facility] as Pharmacist (Pharmacist)  Indicate any recent Medical Services you may have received from other than [facility] providers in the past year (date may be approximate).     Assessment:   This is a routine wellness examination for [place].  Virtual Visit via Telephone Note  I connected with  Azir Muzyka Medlen on 07/22/21 at  2:30 PM EST by telephone and verified that I am speaking with the correct person using two identifiers.  Location:  Patient: Home Provider: Office Persons participating in the virtual visit: patient/Nurse Health Advisor   I discussed the limitations, risks, security and privacy concerns of performing an evaluation and management service by telephone and the availability of in person appointments. The patient expressed understanding and agreed to proceed.  Interactive audio and video telecommunications were attempted between this nurse and patient, however failed, due to patient having technical difficulties OR patient did not have access to video capability.  We continued and completed visit with audio  only.  Some vital signs may be absent or patient reported.   Criselda Peaches, LPN   Hearing/Vision screen Hearing Screening - Comments:: No difficulty hearing. Vision  Screening - Comments:: Wears reading glasses. Follow[facility] by [facility].  Dietary issues and exercise activities discussed: Current Exercise Habits: Home exercise routine, Type of exercise: walking;stretching, Time (Minutes): 20, Frequency (Times/Week): 5, Weekly Exercise (Minutes/Week): 100, Intensity: Moderate   Goals Addressed             This Visit's Progress    Patient Stated       Get well and maintain health.       Depression Screen PHQ 2/9 Scores 07/22/2021 07/22/2021 06/20/2021 11/18/2020 07/30/2020 06/19/2020 05/23/2020  PHQ - 2 Score 0 0 0 0 0 0 0  PHQ- 9 Score - - - 4 - - -    Fall Risk Fall Risk  07/22/2021 06/21/2021 06/19/2020 05/23/2020 12/02/2018  Falls in the past year? 1 1 1  0 0  Comment Patient lost balance. No injury - - - -  Number falls in past yr: 0 0 1 0 -  Injury with Fall? 0 0 1 0 -  Comment - - bruised shoulder left - -  Risk for fall due to : Impaired balance/gait Orthopedic patient;Impaired balance/gait;History of fall(s) Impaired balance/gait;Impaired mobility;Impaired vision;History of fall(s) - Medication side effect  Follow up - Education provided Falls prevention discussed - Education provided;Falls prevention discussed    FALL RISK PREVENTION PERTAINING TO THE HOME:  Any stairs in or around the home? Yes  If so, are there any without handrails? No  Home free of loose throw rugs in walkways, pet beds, electrical cords, etc? Yes  Adequate lighting in your home to reduce risk of falls? Yes   ASSISTIVE DEVICES UTILIZED TO PREVENT FALLS:  Life alert? No  Use of a cane, walker or w/c? Yes  Grab bars in the bathroom? Yes  Shower chair or bench in shower? Yes  Elevated toilet seat or a handicapped toilet? No   TIMED UP AND GO:  Was the test performed?  No . Audio  Visit.  Cognitive Function: MMSE - Mini Mental State Exam 12/02/2018 11/30/2017  Orientation to time 5 5  Orientation to Place 5 5  Registration 3 3  Attention/ Calculation 4 1  Recall 2 1  Language- name 2 objects 1 2  Language- name 2 objects-comments could not correctly identify picture of bird (flamingo) -  Language- repeat 1 1  Language- follow 3 step command 3 3  Language- read & follow direction 1 1  Write a sentence 1 1  Copy design 0 1  Copy design-comments objects had 4 sides, not 5 -  Total score 26 24     6CIT Screen 07/22/2021 06/19/2020  What Year? 0 points 0 points  What month? 0 points 0 points  What time? 0 points -  Count back from 20 0 points 0 points  Months in reverse 0 points 0 points  Repeat phrase 2 points 2 points  Total Score 2 -    Immunizations Immunization History  Administered Date(s) Administered   Fluad Quad(high Dose 65+) 05/17/2019, 05/21/2020, 06/20/2021   Influenza, High Dose Seasonal PF 05/05/2016, 06/01/2017, 06/22/2018   PFIZER(Purple Top)SARS-COV-2 Vaccination 10/05/2019, 10/26/2019, 06/18/2020   Pneumococcal Polysaccharide-23 07/31/2020   Td 02/13/2019    Covid-19 vaccine status: Declined, Education has been provided regarding the importance of this vaccine but patient still declined. Advised may receive this vaccine at local pharmacy or Health Dept.or vaccine clinic. Aware to provide a copy of the vaccination record if obtained from local pharmacy or Health Dept. Verbalized acceptance and understanding.  Qualifies for  Shingles Vaccine? Yes   Zostavax completed No   Shingrix Completed?: No.    Education has been provided regarding the importance of this vaccine. Patient has been advised to call insurance company to determine out of pocket expense if they have not yet received this vaccine. Advised may also receive vaccine at local pharmacy or Health Dept. Verbalized acceptance and understanding.  Screening Tests Health Maintenance   Topic Date Due   Pneumonia Vaccine 21+ Years old (2 - PCV) 07/31/2021   COVID-19 Vaccine (4 - Booster for Pfizer series) 08/07/2021 (Originally 08/13/2020)   Zoster Vaccines- Shingrix (1 of 2) 10/22/2021 (Originally 07/25/1960)   TETANUS/TDAP  02/12/2029   INFLUENZA VACCINE  Completed   Hepatitis C Screening  Completed   HPV VACCINES  Aged Out    Health Maintenance  Health Maintenance Due  Topic Date Due   Pneumonia Vaccine 20+ Years old (2 - PCV) 07/31/2021    Vision Screening: Recommended annual ophthalmology exams for early detection of glaucoma and other disorders of the eye. Is the patient up to date with their annual eye exam?  Yes  Who is the provider or what is the name of the office in which the patient attends annual eye exams? Followed by [facility]. .   Dental Screening: Recommended annual dental exams for proper oral hygiene  Community Resource Referral / Chronic Care Management:  CRR required this visit?  No   CCM required this visit?  No      Plan:     I have personally reviewed and noted the following in the patient's chart:   Medical and social history Use of alcohol, tobacco or illicit drugs  Current medications and supplements including opioid prescriptions. Patient is not currently taking opioid prescriptions. Functional ability and status Nutritional status Physical activity Advanced directives List of other physicians Hospitalizations, surgeries, and ER visits in previous 12 months Vitals Screenings to include cognitive, depression, and falls Referrals and appointments  In addition, I have reviewed and discussed with patient certain preventive protocols, quality metrics, and best practice recommendations. A written personalized care plan for preventive services as well as general preventive health recommendations were provided to patient.     Criselda Peaches, LPN   16/09/958

## 2021-07-22 NOTE — Patient Instructions (Addendum)
Tony [name] , Thank you for taking time to come for your Medicare Wellness Visit. I appreciate your ongoing commitment to your health goals. Please review the following plan we discussed and let me know if I can assist you in the future.   These are the goals we discussed:  Goals      get well and walk better     Patient Stated     Get well and maintain health.     Patient Stated     Re-start silver sneakers, walking outside.  Start a new hobby?        This is a list of the screening recommended for you and due dates:  Health Maintenance  Topic Date Due   Pneumonia Vaccine (2 - PCV) 07/31/2021   COVID-19 Vaccine (4 - Booster for Pfizer series) 08/07/2021*   Zoster (Shingles) Vaccine (1 of 2) 10/22/2021*   Tetanus Vaccine  02/12/2029   Flu Shot  Completed   Hepatitis C Screening: USPSTF Recommendation to screen - Ages 18-79 yo.  Completed   HPV Vaccine  Aged Out  *Topic was postponed. The date shown is not the original due date.    Advanced directives: No.  Conditions/risks identified: None  Next appointment: Follow up in one year for your annual wellness visit.   Preventive Care 16 Years and Older, Male Preventive care refers to lifestyle choices and visits with your health care provider that can promote health and wellness. What does preventive care include? A yearly physical exam. This is also called an annual well check. Dental exams once or twice a year. Routine eye exams. Ask your health care provider how often you should have your eyes checked. Personal lifestyle choices, including: Daily care of your teeth and gums. Regular physical activity. Eating a healthy diet. Avoiding tobacco and drug use. Limiting alcohol use. Practicing safe sex. Taking low doses of aspirin every day. Taking vitamin and mineral supplements as recommended by your health care provider. What happens during an annual well check? The services and screenings done by your health care  provider during your annual well check will depend on your age, overall health, lifestyle risk factors, and family history of disease. Counseling  Your health care provider may ask you questions about your: Alcohol use. Tobacco use. Drug use. Emotional well-being. Home and relationship well-being. Sexual activity. Eating habits. History of falls. Memory and ability to understand (cognition). Work and work Statistician. Screening  You may have the following tests or measurements: Height, weight, and BMI. Blood pressure. Lipid and cholesterol levels. These may be checked every 5 years, or more frequently if you are over 51 years old. Skin check. Lung cancer screening. You may have this screening every year starting at age 69 if you have a 30-pack-year history of smoking and currently smoke or have quit within the past 15 years. Fecal occult blood test (FOBT) of the stool. You may have this test every year starting at age 45. Flexible sigmoidoscopy or colonoscopy. You may have a sigmoidoscopy every 5 years or a colonoscopy every 10 years starting at age 64. Prostate cancer screening. Recommendations will vary depending on your family history and other risks. Hepatitis C blood test. Hepatitis B blood test. Sexually transmitted disease (STD) testing. Diabetes screening. This is done by checking your blood sugar (glucose) after you have not eaten for a while (fasting). You may have this done every 1-3 years. Abdominal aortic aneurysm (AAA) screening. You may need this if you are a current  or former smoker. Osteoporosis. You may be screened starting at age 41 if you are at high risk. Talk with your health care provider about your test results, treatment options, and if necessary, the need for more tests. Vaccines  Your health care provider may recommend certain vaccines, such as: Influenza vaccine. This is recommended every year. Tetanus, diphtheria, and acellular pertussis (Tdap, Td)  vaccine. You may need a Td booster every 10 years. Zoster vaccine. You may need this after age 63. Pneumococcal 13-valent conjugate (PCV13) vaccine. One dose is recommended after age 11. Pneumococcal polysaccharide (PPSV23) vaccine. One dose is recommended after age 20. Talk to your health care provider about which screenings and vaccines you need and how often you need them. This information is not intended to replace advice given to you by your health care provider. Make sure you discuss any questions you have with your health care provider. Document Released: 09/27/2015 Document Revised: 05/20/2016 Document Reviewed: 07/02/2015 Elsevier Interactive Patient Education  2017 [place] Prevention in the Home Falls can cause injuries. They can happen to people of all ages. There are many things you can do to make your home safe and to help prevent falls. What can I do on the outside of my home? Regularly fix the edges of walkways and driveways and fix any cracks. Remove anything that might make you trip as you walk through a door, such as a raised step or threshold. Trim any bushes or trees on the path to your home. Use bright outdoor lighting. Clear any walking paths of anything that might make someone trip, such as rocks or tools. Regularly check to see if handrails are loose or broken. Make sure that both sides of any steps have handrails. Any raised decks and porches should have guardrails on the edges. Have any leaves, snow, or ice cleared regularly. Use sand or salt on walking paths during winter. Clean up any spills in your garage right away. This includes oil or grease spills. What can I do in the bathroom? Use night lights. Install grab bars by the toilet and in the tub and shower. Do not use towel bars as grab bars. Use non-skid mats or decals in the tub or shower. If you need to sit down in the shower, use a plastic, non-slip stool. Keep the floor dry. Clean up any  water that spills on the floor as soon as it happens. Remove soap buildup in the tub or shower regularly. Attach bath mats securely with double-sided non-slip rug tape. Do not have throw rugs and other things on the floor that can make you trip. What can I do in the bedroom? Use night lights. Make sure that you have a light by your bed that is easy to reach. Do not use any sheets or blankets that are too big for your bed. They should not hang down onto the floor. Have a firm chair that has side arms. You can use this for support while you get dressed. Do not have throw rugs and other things on the floor that can make you trip. What can I do in the kitchen? Clean up any spills right away. Avoid walking on wet floors. Keep items that you use a lot in easy-to-reach places. If you need to reach something above you, use a strong step stool that has a grab bar. Keep electrical cords out of the way. Do not use floor polish or wax that makes floors slippery. If you must use wax,  use non-skid floor wax. Do not have throw rugs and other things on the floor that can make you trip. What can I do with my stairs? Do not leave any items on the stairs. Make sure that there are handrails on both sides of the stairs and use them. Fix handrails that are broken or loose. Make sure that handrails are as long as the stairways. Check any carpeting to make sure that it is firmly attached to the stairs. Fix any carpet that is loose or worn. Avoid having throw rugs at the top or bottom of the stairs. If you do have throw rugs, attach them to the floor with carpet tape. Make sure that you have a light switch at the top of the stairs and the bottom of the stairs. If you do not have them, ask someone to add them for you. What else can I do to help prevent falls? Wear shoes that: Do not have high heels. Have rubber bottoms. Are comfortable and fit you well. Are closed at the toe. Do not wear sandals. If you use a  stepladder: Make sure that it is fully opened. Do not climb a closed stepladder. Make sure that both sides of the stepladder are locked into place. Ask someone to hold it for you, if possible. Clearly mark and make sure that you can see: Any grab bars or handrails. First and last steps. Where the edge of each step is. Use tools that help you move around (mobility aids) if they are needed. These include: Canes. Walkers. Scooters. Crutches. Turn on the lights when you go into a dark area. Replace any light bulbs as soon as they burn out. Set up your furniture so you have a clear path. Avoid moving your furniture around. If any of your floors are uneven, fix them. If there are any pets around you, be aware of where they are. Review your medicines with your doctor. Some medicines can make you feel dizzy. This can increase your chance of falling. Ask your doctor what other things that you can do to help prevent falls. This information is not intended to replace advice given to you by your health care provider. Make sure you discuss any questions you have with your health care provider. Document Released: 06/27/2009 Document Revised: 02/06/2016 Document Reviewed: 10/05/2014 Elsevier Interactive Patient Education  2017 [employer].

## 2021-07-24 ENCOUNTER — Encounter: Payer: Self-pay | Admitting: Pharmacist

## 2021-07-24 NOTE — Progress Notes (Signed)
Oral Oncology Pharmacist Encounter   Was successful in securing patient a $4250 grant from [employer] to provide copayment coverage for Zytiga.  This will keep the out of pocket expense at $0.     The billing information is as follows and has been shared with [facility].   Member ID: [id number] Group ID: [id number] RxBin: [id number] PCN: [id number] Dates of Eligibility: 07/23/2021 through 07/23/2022  Fund name:  [employer].  Leron Croak, PharmD, BCPS Hematology/Oncology Clinical Pharmacist [facility] [place] [phone] 07/24/2021 9:49 AM

## 2021-07-28 ENCOUNTER — Other Ambulatory Visit (HOSPITAL_COMMUNITY): Payer: Self-pay

## 2021-07-28 ENCOUNTER — Encounter: Payer: Self-pay | Admitting: Oncology

## 2021-08-07 DIAGNOSIS — R269 Unspecified abnormalities of gait and mobility: Secondary | ICD-10-CM | POA: Diagnosis not present

## 2021-08-07 DIAGNOSIS — G4733 Obstructive sleep apnea (adult) (pediatric): Secondary | ICD-10-CM | POA: Diagnosis not present

## 2021-08-14 ENCOUNTER — Telehealth: Payer: Self-pay | Admitting: Pharmacist

## 2021-08-14 DIAGNOSIS — R269 Unspecified abnormalities of gait and mobility: Secondary | ICD-10-CM | POA: Diagnosis not present

## 2021-08-14 DIAGNOSIS — G4733 Obstructive sleep apnea (adult) (pediatric): Secondary | ICD-10-CM | POA: Diagnosis not present

## 2021-08-14 NOTE — Chronic Care Management (AMB) (Signed)
    Chronic Care Management Pharmacy Assistant   Name: Tony [name]  MRN: [id number] DOB: 08/08/1941  Reason for Encounter: Reschedule appointment  Patient rescheduled to 11/24/2021   Care Gaps: AWV -  scheduled for 07/23/2022 Zoster vaccines - never done Covid-19 vaccine booster 4 - overdue since 09/18/20 Last BP reading - 130/80 on 06/20/2021  Star Rating Drugs: Atorvastatin 20mg  - last filled on 05/28/21 90DS at [place] [phone]

## 2021-08-15 NOTE — Progress Notes (Signed)
HPI M former smoker followed for OSA, COPD, hx Respiratory Failure, dCHF w pulmonary edema, hx E.Coli Bacteremia, CAD, Chronic AFib/ Pacemaker, Prostate Cancer, Aortic Aneurysm HST 09/02/20- AHI 40.8/ hr, desaturation to 66%/ avbg 88%, body weight 228 lbs Pending CPAP titration w O2 check on 2/22 PFT 09/09/20- Minimal obstruction CPAP titration 11/05/20- to BIPAP 25/21 and required O2 3l =====================================================================  04/16/21- 24 yoM former smoker followed for OSA, COPD, hx Respiratory Failure, dCHF w pulmonary edema, hx E.Coli Bacteremia, Chronic AFib/ Pacemaker, Prostate Cancer, Aortic Aneurysms. Celiac Stenosis,  -Wixela 100, Proair hfa   Prednisone 5 mg daily(Heme/Onc) CPAP titration 11/05/20- to BIPAP 25/21 and required O2 3l AutoBIPAP EPAP 4-8, IPAP 14-20, PS 2, with O2 3L for sleep/ [employer] Aortic aneurysms followed by Dr Cyndia Bent Body weight today-227 lbs Covid vax-3 PHIZER   CTa chest 01/29/21- IMPRESSION: 1. Unchanged enlargement of the tubular ascending thoracic aorta, measuring up to 4.7 x 4.6 cm. Unchanged enlargement of the descending thoracic aorta, measuring up to 3.2 x 3.1 cm. Ascending thoracic aortic aneurysm. Recommend semi-annual imaging followup by CTA or MRA and referral to cardiothoracic surgery if not already obtained. This recommendation follows 2010 ACCF/AHA/AATS/ACR/ASA/SCA/SCAI/SIR/STS/SVM Guidelines for the Diagnosis and Management of Patients With Thoracic Aortic Disease. Circulation. 2010; 121: F621-H086. Aortic aneurysm NOS (ICD10-I71.9) 2. 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 Recommend follow-up ultrasound every 2 years not otherwise imaged per above recommendations. This recommendation follows ACR consensus guidelines: White Paper of the ACR Incidental Findings Committee II on Vascular Findings. J Am  Coll Radiol 2013; 10:789-794. 3. There is redemonstrated, long segment, high-grade narrowing of the origin of the celiac axis, stenotic segment approximately 2.0 cm in length with poststenotic dilatation. 4. Moderate mixed calcific atherosclerosis throughout. 5. Coronary artery disease. 6. Emphysema. Aortic Atherosclerosis (ICD10-I70.0) and Emphysema (ICD10-J43.9).  08/18/21- 31 yoM former smoker followed for OSA, COPD, hx Respiratory Failure, dCHF w pulmonary edema, hx E.Coli Bacteremia, Chronic AFib/ Pacemaker, Prostate Cancer, Aortic Aneurysms. Celiac Stenosis,  -Symbicort 160, Proair hfa   Prednisone 5 mg daily(Heme/Onc) AutoBIPAP EPAP 4-8, IPAP 14-20, PS 2, with O2 3L for sleep/ [employer] Download-compliance 86.7%, AHI 5/ hr Aortic aneurysms followed by Dr Cyndia Bent Body weight today-228 lbs Covid vax-3 PHIZER Flu vax-had [facility] 10/30 - CAP/ Sepsis, RSV infection, COPD exacerb, Resp Failure, Peripheral edema, He and his wife describe frequent choking and strangling while swallowing food or water.  This suggest possibility recent pneumonia was aspiration although he was told it was "RSV".  They have no exposure to small children.  He sleeps comfortably with his BiPAP machine.  Rarely uses rescue inhaler. CXR 07/13/21 1V- IMPRESSION: Minimal reticular densities are noted in the perihilar and basilar regions bilaterally which may represent scarring or atelectasis, but acute superimposed edema or atypical inflammation cannot be excluded.   ROS-see HPI   + = positive Constitutional:    weight loss, night sweats, fevers, chills, fatigue, lassitude. HEENT:    headaches, difficulty swallowing, tooth/dental problems, sore throat,       sneezing, itching, ear ache, nasal congestion, post nasal drip, snoring CV:    chest pain, orthopnea, PND, swelling in lower extremities, anasarca,                                   dizziness, +palpitations Resp:   +shortness of breath with exertion or  at rest.                 productive cough,   +non-productive cough, coughing up of blood.              change in color of mucus.  wheezing.   Skin:    rash or lesions. GI:  + heartburn, indigestion, abdominal pain, nausea, vomiting, diarrhea,                 change in bowel habits, loss of appetite GU: dysuria, change in color of urine, no urgency or frequency.   flank pain. MS:   joint pain, stiffness, decreased range of motion, back pain. Neuro-     nothing unusual Psych:  change in mood or affect.  depression or +anxiety.   memory loss.  OBJ- Physical Exam General- Alert, Oriented, Affect-appropriate, Distress- none acute, + obese Skin- rash-none, lesions- none, excoriation- none Lymphadenopathy- none Head- atraumatic            Eyes- Gross vision intact, PERRLA, conjunctivae and secretions clear            Ears- Hearing, canals-normal            Nose- Clear, no-Septal dev, mucus, polyps, erosion, perforation             Throat- Mallampati IV , mucosa clear , drainage- none, tonsils- atrophic, + teeth Neck- flexible , trachea midline, no stridor , thyroid nl, carotid no bruit Chest - symmetrical excursion , unlabored           Heart/CV- RRR , no murmur , no gallop  , no rub, nl s1 s2                           - JVD- none , edema+trace, stasis changes- none, varices- none           Lung- + trace crackle in bases, wheeze- none, cough- none , dullness-none, rub- none           Chest wall- +pacemaker L  Abd-  Br/ Gen/ Rectal- Not done, not indicated Extrem- cyanosis- none, clubbing, none, atrophy- none, strength- nl, + cane Neuro- grossly intact to observation

## 2021-08-18 ENCOUNTER — Ambulatory Visit: Payer: Medicare Other | Admitting: Internal Medicine

## 2021-08-18 ENCOUNTER — Other Ambulatory Visit: Payer: Self-pay

## 2021-08-18 ENCOUNTER — Ambulatory Visit (INDEPENDENT_AMBULATORY_CARE_PROVIDER_SITE_OTHER): Payer: Medicare Other

## 2021-08-18 ENCOUNTER — Encounter: Payer: Self-pay | Admitting: Internal Medicine

## 2021-08-18 VITALS — BP 102/70 | HR 87 | Temp 97.8°F | Ht 67.5 in | Wt 228.0 lb

## 2021-08-18 DIAGNOSIS — R1311 Dysphagia, oral phase: Secondary | ICD-10-CM | POA: Diagnosis not present

## 2021-08-18 DIAGNOSIS — G4733 Obstructive sleep apnea (adult) (pediatric): Secondary | ICD-10-CM

## 2021-08-18 DIAGNOSIS — J449 Chronic obstructive pulmonary disease, unspecified: Secondary | ICD-10-CM | POA: Diagnosis not present

## 2021-08-18 DIAGNOSIS — R1310 Dysphagia, unspecified: Secondary | ICD-10-CM | POA: Insufficient documentation

## 2021-08-18 DIAGNOSIS — J439 Emphysema, unspecified: Secondary | ICD-10-CM | POA: Diagnosis not present

## 2021-08-18 NOTE — Assessment & Plan Note (Signed)
Current inhalers are appropriate and he seems to feel well controlled.  Prednisone is provided by hematology.

## 2021-08-18 NOTE — Patient Instructions (Signed)
Order- CXR    dx COPD,  recent CAP  Order- referral for Modified Barium Swallow  with Speech Therapy Consultation     Dx Dysphagia with food and drink  We continue BIPAP 25/21 with O2 3L  Please call if we can help

## 2021-08-18 NOTE — Assessment & Plan Note (Signed)
He is describing frequent cough swallowing solids and liquids and I suspect aspiration risk.  We discussed the importance of sitting upright to eat and drink. Plan-schedule modified barium swallow with speech therapy consultation

## 2021-08-18 NOTE — Assessment & Plan Note (Signed)
He benefits from BiPAP with good compliance and control Plan-continue auto BiPAP

## 2021-08-19 ENCOUNTER — Other Ambulatory Visit (HOSPITAL_COMMUNITY): Payer: Self-pay | Admitting: *Deleted

## 2021-08-19 DIAGNOSIS — R1310 Dysphagia, unspecified: Secondary | ICD-10-CM

## 2021-08-22 ENCOUNTER — Other Ambulatory Visit (HOSPITAL_COMMUNITY): Payer: Self-pay

## 2021-08-27 ENCOUNTER — Ambulatory Visit (HOSPITAL_COMMUNITY)
Admission: RE | Admit: 2021-08-27 | Discharge: 2021-08-27 | Disposition: A | Discharge status: Home / Self Care | Payer: Medicare Other | Source: Ambulatory Visit | Attending: Internal Medicine | Admitting: Internal Medicine

## 2021-08-27 ENCOUNTER — Other Ambulatory Visit: Payer: Self-pay

## 2021-08-27 DIAGNOSIS — R1311 Dysphagia, oral phase: Secondary | ICD-10-CM

## 2021-08-27 DIAGNOSIS — R1310 Dysphagia, unspecified: Secondary | ICD-10-CM | POA: Diagnosis not present

## 2021-08-27 DIAGNOSIS — R1312 Dysphagia, oropharyngeal phase: Secondary | ICD-10-CM

## 2021-08-27 DIAGNOSIS — R1313 Dysphagia, pharyngeal phase: Secondary | ICD-10-CM | POA: Insufficient documentation

## 2021-08-27 NOTE — Therapy (Signed)
Modified Barium Swallow Progress Note  Patient Details  Name: Tony [name] MRN: [id number] Date of Birth: [birth year]  Today's Date: 08/27/2021  Modified Barium Swallow completed.  Full report located under Chart Review in the Imaging Section.  Brief recommendations include the following:  Clinical Impression Pt presents with functional oral swallow. Pharyngeally, mild premature spillage noted to the vallecular sinus on nectar thick liquid, puree, and solid textures. Spillage to the pyriform sinus noted on thin liquids. Intermittent flash penetration was seen during the swallow of thin liquids. Penetrate cleared completely and spontaneously without aspiration. No post-swallow residue was seen on any consistency.  Recommend continuing with regular solids and thin liquids. Pt was encouraged to limit bolus size and rate, and to minimize talking while eating. No further ST intervention recommended at this time     Swallow Evaluation Recommendations  SLP Diet Recommendations: Regular solids;Thin liquid   Liquid Administration via: Cup;Straw   Medication Administration: Whole meds with liquid   Supervision: Patient able to self feed   Compensations: Minimize environmental distractions;Slow rate;Small sips/bites   Postural Changes: Seated upright at 90 degrees;Remain semi-upright after meals   Oral Care Recommendations: Oral care BID       Tony [name] B. Quentin Ore, [facility], [place] Speech Language Pathologist Office: [phone]  Shonna Chock 08/27/2021,12:56 PM

## 2021-08-28 ENCOUNTER — Telehealth: Payer: Self-pay | Admitting: Pharmacist

## 2021-08-28 NOTE — Chronic Care Management (AMB) (Signed)
° ° °  Chronic Care Management Pharmacy Assistant   Name: Tony [name]  MRN: [id number] DOB: [birth year]  Reason for Encounter: Follow up patient assistance for symbicort. Spoke with Sharyn Lull at [place], patient has been approved through Dec. 2023, current prescription is on file.  Patient notified.  Care Gaps: AWV -  scheduled for 07/23/2022 Zoster vaccines - never done Covid-19 vaccine booster 4 - overdue since 09/18/20 Last BP reading - 130/80 on 06/20/2021  Star Rating Drugs: Atorvastatin 20mg  - last filled on 05/28/21 90DS at [place] [phone]

## 2021-08-29 ENCOUNTER — Other Ambulatory Visit: Payer: Self-pay | Admitting: *Deleted

## 2021-08-29 DIAGNOSIS — C61 Malignant neoplasm of prostate: Secondary | ICD-10-CM

## 2021-08-29 MED ORDER — ABIRATERONE ACETATE 250 MG PO TABS: ORAL_TABLET | Freq: Every day | ORAL | 2 refills | Status: DC

## 2021-08-29 MED ORDER — ABIRATERONE ACETATE 250 MG PO TABS
ORAL_TABLET | Freq: Every day | ORAL | 2 refills | Status: DC
  Filled 2021-08-29: qty 120, fill #0
  Filled 2021-09-16: qty 120, 30d supply, fill #0
  Filled 2021-10-20: qty 120, 30d supply, fill #1
  Filled 2021-11-21: qty 120, 30d supply, fill #2

## 2021-08-30 ENCOUNTER — Other Ambulatory Visit: Payer: Self-pay

## 2021-09-01 ENCOUNTER — Other Ambulatory Visit (HOSPITAL_COMMUNITY): Payer: Self-pay

## 2021-09-01 ENCOUNTER — Telehealth: Payer: Medicare Other

## 2021-09-01 ENCOUNTER — Telehealth: Payer: Self-pay

## 2021-09-01 NOTE — Telephone Encounter (Signed)
Oral Chemotherapy Pharmacist Encounter  Informed patient that we will be filling his Zytiga at [place] and that we have grants to cover patients out of pocket costs. Patient states he has about a week of medication into January. Patient will need refill call the week of January 3rd.   Drema Halon, PharmD Hematology/Oncology Clinical Pharmacist [facility] [phone]

## 2021-09-04 ENCOUNTER — Other Ambulatory Visit: Payer: Self-pay | Admitting: Oncology

## 2021-09-04 DIAGNOSIS — C61 Malignant neoplasm of prostate: Secondary | ICD-10-CM

## 2021-09-06 DIAGNOSIS — R269 Unspecified abnormalities of gait and mobility: Secondary | ICD-10-CM | POA: Diagnosis not present

## 2021-09-06 DIAGNOSIS — G4733 Obstructive sleep apnea (adult) (pediatric): Secondary | ICD-10-CM | POA: Diagnosis not present

## 2021-09-12 ENCOUNTER — Other Ambulatory Visit (HOSPITAL_COMMUNITY): Payer: Self-pay

## 2021-09-14 DIAGNOSIS — G4733 Obstructive sleep apnea (adult) (pediatric): Secondary | ICD-10-CM | POA: Diagnosis not present

## 2021-09-14 DIAGNOSIS — R269 Unspecified abnormalities of gait and mobility: Secondary | ICD-10-CM | POA: Diagnosis not present

## 2021-09-15 NOTE — Assessment & Plan Note (Signed)
We discussed his inhalers.  He continues Wixela but has not felt need for a rescue inhaler.  I advised to keep on available.

## 2021-09-15 NOTE — Assessment & Plan Note (Signed)
He reports routine use of BiPAP and improved sleep. Plan-we are contacting his DME to get [facility] or an ST card.  Adjust humidifier for dryness.

## 2021-09-16 ENCOUNTER — Other Ambulatory Visit (HOSPITAL_COMMUNITY): Payer: Self-pay

## 2021-09-16 ENCOUNTER — Ambulatory Visit (INDEPENDENT_AMBULATORY_CARE_PROVIDER_SITE_OTHER): Payer: Medicare Other

## 2021-09-16 ENCOUNTER — Telehealth: Payer: Self-pay

## 2021-09-16 DIAGNOSIS — I495 Sick sinus syndrome: Secondary | ICD-10-CM

## 2021-09-16 LAB — CUP PACEART REMOTE DEVICE CHECK
Battery Remaining Longevity: 128 mo
Battery Voltage: 3.02 V
Brady Statistic AP VP Percent: 0.05 %
Brady Statistic AP VS Percent: 84.66 %
Brady Statistic AS VP Percent: 0.01 %
Brady Statistic AS VS Percent: 15.29 %
Brady Statistic RA Percent Paced: 84.46 %
Brady Statistic RV Percent Paced: 0.06 %
Date Time Interrogation Session: 20230102202344
Implantable Lead Implant Date: 20191001
Implantable Lead Implant Date: 20191001
Implantable Lead Location: 753859
Implantable Lead Location: 753860
Implantable Lead Model: 5076
Implantable Lead Model: 5076
Implantable Pulse Generator Implant Date: 20191001
Lead Channel Impedance Value: 323 Ohm
Lead Channel Impedance Value: 323 Ohm
Lead Channel Impedance Value: 418 Ohm
Lead Channel Impedance Value: 456 Ohm
Lead Channel Pacing Threshold Amplitude: 0.5 V
Lead Channel Pacing Threshold Amplitude: 0.75 V
Lead Channel Pacing Threshold Pulse Width: 0.4 ms
Lead Channel Pacing Threshold Pulse Width: 0.4 ms
Lead Channel Sensing Intrinsic Amplitude: 3 mV
Lead Channel Sensing Intrinsic Amplitude: 3 mV
Lead Channel Sensing Intrinsic Amplitude: 3.375 mV
Lead Channel Sensing Intrinsic Amplitude: 3.375 mV
Lead Channel Setting Pacing Amplitude: 1.5 V
Lead Channel Setting Pacing Amplitude: 2.5 V
Lead Channel Setting Pacing Pulse Width: 0.4 ms
Lead Channel Setting Sensing Sensitivity: 0.9 mV

## 2021-09-16 NOTE — Telephone Encounter (Signed)
Oral Oncology Patient Advocate Encounter   Received notification from [employer] that prior authorization for Tony [name] is required.   PA submitted on CoverMyMeds Key [id number] Status is pending   Oral Oncology Clinic will continue to follow.  [place] Patient [facility] Phone [phone] Fax [phone] 09/16/2021 10:10 AM

## 2021-09-17 ENCOUNTER — Other Ambulatory Visit (HOSPITAL_COMMUNITY): Payer: Self-pay

## 2021-09-18 NOTE — Telephone Encounter (Signed)
Oral Oncology Patient Advocate Encounter  Prior Authorization for Tony [name] has been approved.    PA# [id number] Effective dates: 09/17/21 through 09/13/22   Oral Oncology Clinic will continue to follow.    [place] Patient Tony [name] Phone [phone] Fax [phone] 09/18/2021 1:13 PM

## 2021-09-19 ENCOUNTER — Encounter (HOSPITAL_BASED_OUTPATIENT_CLINIC_OR_DEPARTMENT_OTHER): Payer: Self-pay

## 2021-09-19 ENCOUNTER — Other Ambulatory Visit: Payer: Self-pay

## 2021-09-19 ENCOUNTER — Emergency Department (HOSPITAL_BASED_OUTPATIENT_CLINIC_OR_DEPARTMENT_OTHER)
Admission: EM | Admit: 2021-09-19 | Discharge: 2021-09-19 | Disposition: A | Discharge status: Home / Self Care | Payer: Medicare Other | Attending: Emergency Medicine | Admitting: Emergency Medicine

## 2021-09-19 ENCOUNTER — Emergency Department (HOSPITAL_BASED_OUTPATIENT_CLINIC_OR_DEPARTMENT_OTHER): Payer: Medicare Other

## 2021-09-19 DIAGNOSIS — Z7982 Long term (current) use of aspirin: Secondary | ICD-10-CM | POA: Diagnosis not present

## 2021-09-19 DIAGNOSIS — Z20822 Contact with and (suspected) exposure to covid-19: Secondary | ICD-10-CM | POA: Diagnosis not present

## 2021-09-19 DIAGNOSIS — R0602 Shortness of breath: Secondary | ICD-10-CM | POA: Diagnosis not present

## 2021-09-19 DIAGNOSIS — Z79899 Other long term (current) drug therapy: Secondary | ICD-10-CM | POA: Insufficient documentation

## 2021-09-19 DIAGNOSIS — J069 Acute upper respiratory infection, unspecified: Secondary | ICD-10-CM | POA: Diagnosis not present

## 2021-09-19 DIAGNOSIS — J449 Chronic obstructive pulmonary disease, unspecified: Secondary | ICD-10-CM | POA: Diagnosis not present

## 2021-09-19 DIAGNOSIS — E876 Hypokalemia: Secondary | ICD-10-CM | POA: Insufficient documentation

## 2021-09-19 DIAGNOSIS — R059 Cough, unspecified: Secondary | ICD-10-CM | POA: Diagnosis not present

## 2021-09-19 LAB — COMPREHENSIVE METABOLIC PANEL
ALT: 11 U/L (ref 0–44)
AST: 14 U/L — ABNORMAL LOW (ref 15–41)
Albumin: 4 g/dL (ref 3.5–5.0)
Alkaline Phosphatase: 81 U/L (ref 38–126)
Anion gap: 11 (ref 5–15)
BUN: 12 mg/dL (ref 8–23)
CO2: 26 mmol/L (ref 22–32)
Calcium: 8.8 mg/dL — ABNORMAL LOW (ref 8.9–10.3)
Chloride: 100 mmol/L (ref 98–111)
Creatinine, Ser: 0.98 mg/dL (ref 0.61–1.24)
GFR, Estimated: >60 mL/min (ref >60)
Glucose, Bld: 142 mg/dL — ABNORMAL HIGH (ref 70–99)
Potassium: 2.6 mmol/L — CL (ref 3.5–5.1)
Sodium: 137 mmol/L (ref 135–145)
Total Bilirubin: 1.4 mg/dL — ABNORMAL HIGH (ref 0.3–1.2)
Total Protein: 6.6 g/dL (ref 6.5–8.1)

## 2021-09-19 LAB — CBC WITH DIFFERENTIAL/PLATELET
Abs Immature Granulocytes: 0.05 10*3/uL (ref 0.00–0.07)
Basophils Absolute: 0 10*3/uL (ref 0.0–0.1)
Basophils Relative: 0 %
Eosinophils Absolute: 0.1 10*3/uL (ref 0.0–0.5)
Eosinophils Relative: 1 %
HCT: 40.6 % (ref 39.0–52.0)
Hemoglobin: 13.5 g/dL (ref 13.0–17.0)
Immature Granulocytes: 0 %
Lymphocytes Relative: 4 %
Lymphs Abs: 0.4 10*3/uL — ABNORMAL LOW (ref 0.7–4.0)
MCH: 29.9 pg (ref 26.0–34.0)
MCHC: 33.3 g/dL (ref 30.0–36.0)
MCV: 90 fL (ref 80.0–100.0)
Monocytes Absolute: 0.6 10*3/uL (ref 0.1–1.0)
Monocytes Relative: 6 %
Neutro Abs: 10.1 10*3/uL — ABNORMAL HIGH (ref 1.7–7.7)
Neutrophils Relative %: 89 %
Platelets: 186 10*3/uL (ref 150–400)
RBC: 4.51 MIL/uL (ref 4.22–5.81)
RDW: 13.2 % (ref 11.5–15.5)
WBC: 11.3 10*3/uL — ABNORMAL HIGH (ref 4.0–10.5)
nRBC: 0 % (ref 0.0–0.2)

## 2021-09-19 LAB — RESP PANEL BY RT-PCR (FLU A&B, COVID) ARPGX2
Influenza A by PCR: NEGATIVE
Influenza B by PCR: NEGATIVE
SARS Coronavirus 2 by RT PCR: NEGATIVE

## 2021-09-19 MED ORDER — AZITHROMYCIN 250 MG PO TABS: 250.0000 mg | ORAL_TABLET | Freq: Every day | ORAL | 0 refills | Status: DC

## 2021-09-19 MED ORDER — POTASSIUM CHLORIDE 10 MEQ/100ML IV SOLN
10.0000 meq | Freq: Once | INTRAVENOUS | Status: AC
  Administered 2021-09-19: 10 meq via INTRAVENOUS
  Filled 2021-09-19: qty 100

## 2021-09-19 MED ORDER — POTASSIUM CHLORIDE ER 20 MEQ PO TBCR: 10.0000 meq | EXTENDED_RELEASE_TABLET | Freq: Two times a day (BID) | ORAL | 0 refills | Status: DC

## 2021-09-19 MED ORDER — PREDNISONE 20 MG PO TABS: 40.0000 mg | ORAL_TABLET | Freq: Every day | ORAL | 0 refills | Status: DC

## 2021-09-19 MED ORDER — POTASSIUM CHLORIDE CRYS ER 20 MEQ PO TBCR
40.0000 meq | EXTENDED_RELEASE_TABLET | Freq: Once | ORAL | Status: AC
  Administered 2021-09-19: 40 meq via ORAL
  Filled 2021-09-19: qty 2

## 2021-09-19 NOTE — Discharge Instructions (Addendum)
Stop the 5 mg tabs prednisone until you finish the 40 mg dose.  Your potassium was low and needs to be rechecked.  Follow-up with your doctors as needed.

## 2021-09-19 NOTE — ED Provider Notes (Signed)
[facility] Provider Note   CSN: [id number] Arrival date & time: 09/19/21  5027     History  Chief Complaint  Patient presents with   Cough    With SOB    [name] is a 81 y.o. male.   Cough Associated symptoms: no chest pain   Patient with history of COPD.  CPAP at night.  Presents with shortness of breath.  Has had for around 4 days.  Cough with increased sputum production of green sputum.  No fevers.  No known sick contacts.  States he feels as if he could walk to the car if he needed to.  Not on oxygen at baseline.  No swelling in his legs.  Presents here with his wife who additional history was from obtained from.    Home Medications Prior to Admission medications   Medication Sig Start Date End Date Taking? Authorizing Provider  azithromycin (ZITHROMAX) 250 MG tablet Take 1 tablet (250 mg total) by mouth daily. Take first 2 tablets together, then 1 every day until finished. 09/19/21  Yes Davonna Belling, MD  potassium chloride 20 MEQ TBCR Take 10 mEq by mouth 2 (two) times daily. 09/19/21  Yes Davonna Belling, MD  predniSONE (DELTASONE) 20 MG tablet Take 2 tablets (40 mg total) by mouth daily. 09/19/21  Yes Davonna Belling, MD  abiraterone acetate (ZYTIGA) 250 MG tablet TAKE 4 TABLETS (1,000 MG TOTAL) BY MOUTH DAILY. TAKE ON AN EMPTY STOMACH 1 HOUR BEFORE OR 2 HOURS AFTER A MEAL 08/29/21 08/29/22  Wyatt Portela, MD  acetaminophen (TYLENOL) 500 MG tablet Take 1,000 mg by mouth every 6 (six) hours as needed for headache or moderate pain (pain).    [provider]  albuterol (VENTOLIN HFA) 108 (90 Base) MCG/ACT inhaler TAKE 2 PUFFS BY MOUTH EVERY 6 HOURS AS NEEDED FOR WHEEZE OR SHORTNESS OF BREATH Patient taking differently: Inhale 2 puffs into the lungs every 6 (six) hours as needed for wheezing or shortness of breath. 04/16/21   Baird Lyons D, MD  amLODipine (NORVASC) 2.5 MG tablet TAKE 1 TABLET BY MOUTH EVERY DAY Patient  taking differently: Take 2.5 mg by mouth daily. 01/27/21   [name], [name], MD  Ascorbic Acid (VITAMIN C PO) Take 1 tablet by mouth daily.    [provider]  aspirin EC 81 MG tablet Take 81 mg by mouth daily.    [provider]  atorvastatin (LIPITOR) 20 MG tablet TAKE 1 TABLET BY MOUTH EVERY DAY Patient taking differently: Take 40 mg by mouth daily. 04/17/21   Croitoru, Mihai, MD  budesonide-formoterol (SYMBICORT) 160-4.5 MCG/ACT inhaler Inhale 2 puffs then rinse mouth, twice daily Patient taking differently: Inhale 2 puffs into the lungs 2 (two) times daily. Inhale 2 puffs then rinse mouth, twice daily 04/16/21   Baird Lyons D, MD  carbamide peroxide (DEBROX) 6.5 % OTIC solution Place 5 drops into both ears daily as needed (for earwax).    [provider]  citalopram (CELEXA) 10 MG tablet TAKE 1 TABLET BY MOUTH EVERY DAY Patient taking differently: Take 10 mg by mouth daily. 01/27/21   [name], [name], MD  diclofenac Sodium (VOLTAREN) 1 % GEL AAPLY 4 GRAMS TOPICALLY 4 TIMES DAILY Patient taking differently: Apply 2 g topically daily as needed (pain). 02/14/21   [name], [name], MD  docusate sodium (COLACE) 100 MG capsule Take 100 mg by mouth daily with supper.    [provider]  fluticasone (FLONASE) 50 MCG/ACT nasal  spray PLACE 1 SPRAY INTO BOTH NOSTRILS DAILY AS NEEDED FOR ALLERGIES OR RHINITIS (AT BEDTIME). Patient taking differently: Place 1 spray into both nostrils at bedtime as needed for allergies or rhinitis. 09/04/19   [name], [name], MD  furosemide (LASIX) 20 MG tablet TAKE 1 TABLET BY MOUITH DAILY FOR 5 DAYS , THEN DAILY AS NEEDED FOR EDEMA Patient taking differently: Take 20 mg by mouth daily. 07/01/21   [name], [name], MD  LORazepam (ATIVAN) 1 MG tablet TAKE 1 TABLET BY MOUTH TWICE A DAY AS NEEDED. DNFB 6/18 Patient taking differently: Take 1 mg by mouth daily. 07/02/21   [name], [name], MD  metoprolol tartrate (LOPRESSOR) 25 MG tablet TAKE 1  TABLET BY MOUTH TWICE A DAY Patient taking differently: Take 25 mg by mouth 2 (two) times daily. 09/30/20   Croitoru, Mihai, MD  Multiple Vitamin (MULTIVITAMIN WITH MINERALS) TABS tablet Take 0.5 tablets by mouth daily. Centrum    [provider]  omeprazole (PRILOSEC) 20 MG capsule TAKE 1 CAPSULE (20 MG TOTAL) BY MOUTH DAILY BEFORE BREAKFAST. 04/18/21   [name], [name], MD  predniSONE (DELTASONE) 5 MG tablet TAKE 1 TABLET BY MOUTH EVERY DAY WITH BREAKFAST 09/04/21   Wyatt Portela, MD  Tetrahydrozoline HCl (VISINE OP) Place 1 drop into both eyes daily as needed (red eyes).    [provider]      Allergies    Penicillins    Review of Systems   Review of Systems  Constitutional:  Negative for appetite change.  Respiratory:  Positive for cough.   Cardiovascular:  Negative for chest pain.   Physical Exam Updated Vital Signs BP 111/82    Pulse 77    Temp 98.8 F (37.1 C) (Oral)    Resp 20    Ht 5\' 8"  (1.727 m)    Wt 100.2 kg    SpO2 92%    BMI 33.60 kg/m  Physical Exam Vitals reviewed.  Cardiovascular:     Rate and Rhythm: Normal rate.  Pulmonary:     Comments: Diffuse somewhat harsh breath sounds.  No focal rales or rhonchi. Musculoskeletal:        General: No tenderness.  Skin:    General: Skin is warm.     Capillary Refill: Capillary refill takes less than 2 seconds.  Neurological:     Mental Status: He is alert and oriented to person, place, and time.    ED Results / Procedures / Treatments   Labs (all labs ordered are listed, but only abnormal results are displayed) Labs Reviewed  COMPREHENSIVE METABOLIC PANEL - Abnormal; Notable for the following components:      Result Value   Potassium 2.6 (*)    Glucose, Bld 142 (*)    Calcium 8.8 (*)    AST 14 (*)    Total Bilirubin 1.4 (*)    All other components within normal limits  CBC WITH DIFFERENTIAL/PLATELET - Abnormal; Notable for the following components:   WBC 11.3 (*)    Neutro Abs 10.1 (*)     Lymphs Abs 0.4 (*)    All other components within normal limits  RESP PANEL BY RT-PCR (FLU A&B, COVID) [id number]    EKG EKG Interpretation  Date/Time:  Friday September 19 2021 08:56:31 EST Ventricular Rate:  87 PR Interval:  193 QRS Duration: 171 QT Interval:  436 QTC Calculation: 525 R Axis:   -71 Text Interpretation: 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 EXAM: PORTABLE CHEST 1 VIEW COMPARISON:  August 18, 2021. FINDINGS: Stable cardiomediastinal silhouette. Left-sided pacemaker is unchanged in position. Mild bibasilar subsegmental atelectasis or scarring is noted. Bony thorax is unremarkable. IMPRESSION: Mild bibasilar subsegmental atelectasis or scarring. Electronically Signed   By: Marijo Conception M.D.   On: 09/19/2021 09:45    Procedures Procedures    Medications Ordered in ED Medications  potassium chloride 10 mEq in 100 mL IVPB (10 mEq Intravenous New Bag/Given 09/19/21 1025)  potassium chloride SA (KLOR-CON M) CR tablet 40 mEq (40 mEq Oral Given 09/19/21 1017)    ED Course/ Medical Decision Making/ A&P                           Medical Decision Making Patient presents with shortness of breath.  Has had for last 2 days.  Cough with increased sputum production.  History of COPD.  Also history of pancreatic cancer.  No fevers.  Not hypoxic.  Is on CPAP at night.  Chest x-ray done reassuring.  Reviewed by me and interpreted by me.  Lab work did show a hypokalemia that was supplemented here.  We will continue supplement at home.  Feels better.  Feels that he can manage at home.  We will treat with antibiotics and steroids for the COPD exacerbation with increased sputum production.  Follow-up with PCP or pulmonology as needed.  Discharge home.  Problems Addressed: Chronic  obstructive pulmonary disease, unspecified COPD type ([place]): chronic illness or injury with exacerbation, progression, or side effects of treatment Hypokalemia: acute illness or injury Upper respiratory tract infection, unspecified type: acute illness or injury  Amount and/or Complexity of Data Reviewed Labs:  Decision-making details documented in ED Course. Radiology:  Decision-making details documented in ED Course. ECG/medicine tests:  Decision-making details documented in ED Course.  Risk Prescription drug management. Decision regarding hospitalization.           Final Clinical Impression(s) / ED Diagnoses Final diagnoses:  Upper respiratory tract infection, unspecified type  Chronic obstructive pulmonary disease, unspecified COPD type ([place])  Hypokalemia    Rx / DC Orders ED Discharge Orders          Ordered    potassium chloride 20 MEQ TBCR  2 times daily        09/19/21 1123    predniSONE (DELTASONE) 20 MG tablet  Daily        09/19/21 1123    azithromycin (ZITHROMAX) 250 MG tablet  Daily        09/19/21 1123              Davonna Belling, MD 09/19/21 1125

## 2021-09-19 NOTE — ED Triage Notes (Signed)
Onset of four days of cough and congestion associated with short of breath on and off worse with walking.  Denies chest pain.  Productive cough greenish

## 2021-09-24 NOTE — Progress Notes (Signed)
Remote pacemaker transmission.   

## 2021-09-27 ENCOUNTER — Emergency Department (HOSPITAL_BASED_OUTPATIENT_CLINIC_OR_DEPARTMENT_OTHER): Payer: Medicare Other

## 2021-09-27 ENCOUNTER — Encounter (HOSPITAL_BASED_OUTPATIENT_CLINIC_OR_DEPARTMENT_OTHER): Payer: Self-pay | Admitting: Emergency Medicine

## 2021-09-27 ENCOUNTER — Other Ambulatory Visit: Payer: Self-pay

## 2021-09-27 ENCOUNTER — Emergency Department (HOSPITAL_BASED_OUTPATIENT_CLINIC_OR_DEPARTMENT_OTHER)
Admission: EM | Admit: 2021-09-27 | Discharge: 2021-09-27 | Disposition: A | Discharge status: Home / Self Care | Payer: Medicare Other | Attending: Emergency Medicine | Admitting: Emergency Medicine

## 2021-09-27 DIAGNOSIS — J449 Chronic obstructive pulmonary disease, unspecified: Secondary | ICD-10-CM | POA: Insufficient documentation

## 2021-09-27 DIAGNOSIS — Z7982 Long term (current) use of aspirin: Secondary | ICD-10-CM | POA: Insufficient documentation

## 2021-09-27 DIAGNOSIS — Z950 Presence of cardiac pacemaker: Secondary | ICD-10-CM | POA: Diagnosis not present

## 2021-09-27 DIAGNOSIS — R17 Unspecified jaundice: Secondary | ICD-10-CM | POA: Insufficient documentation

## 2021-09-27 DIAGNOSIS — I517 Cardiomegaly: Secondary | ICD-10-CM | POA: Diagnosis not present

## 2021-09-27 DIAGNOSIS — Z79899 Other long term (current) drug therapy: Secondary | ICD-10-CM | POA: Insufficient documentation

## 2021-09-27 DIAGNOSIS — K5730 Diverticulosis of large intestine without perforation or abscess without bleeding: Secondary | ICD-10-CM | POA: Diagnosis not present

## 2021-09-27 DIAGNOSIS — Z20822 Contact with and (suspected) exposure to covid-19: Secondary | ICD-10-CM | POA: Diagnosis not present

## 2021-09-27 DIAGNOSIS — I7143 Infrarenal abdominal aortic aneurysm, without rupture: Secondary | ICD-10-CM | POA: Diagnosis not present

## 2021-09-27 DIAGNOSIS — N281 Cyst of kidney, acquired: Secondary | ICD-10-CM | POA: Diagnosis not present

## 2021-09-27 DIAGNOSIS — R109 Unspecified abdominal pain: Secondary | ICD-10-CM | POA: Diagnosis not present

## 2021-09-27 DIAGNOSIS — K769 Liver disease, unspecified: Secondary | ICD-10-CM | POA: Diagnosis not present

## 2021-09-27 LAB — RESP PANEL BY RT-PCR (FLU A&B, COVID) ARPGX2
Influenza A by PCR: NEGATIVE
Influenza B by PCR: NEGATIVE
SARS Coronavirus 2 by RT PCR: NEGATIVE

## 2021-09-27 LAB — COMPREHENSIVE METABOLIC PANEL
ALT: 227 U/L — ABNORMAL HIGH (ref 0–44)
AST: 147 U/L — ABNORMAL HIGH (ref 15–41)
Albumin: 3.8 g/dL (ref 3.5–5.0)
Alkaline Phosphatase: 206 U/L — ABNORMAL HIGH (ref 38–126)
Anion gap: 13 (ref 5–15)
BUN: 12 mg/dL (ref 8–23)
CO2: 23 mmol/L (ref 22–32)
Calcium: 8.8 mg/dL — ABNORMAL LOW (ref 8.9–10.3)
Chloride: 100 mmol/L (ref 98–111)
Creatinine, Ser: 1.05 mg/dL (ref 0.61–1.24)
GFR, Estimated: >60 mL/min (ref >60)
Glucose, Bld: 111 mg/dL — ABNORMAL HIGH (ref 70–99)
Potassium: 3.5 mmol/L (ref 3.5–5.1)
Sodium: 136 mmol/L (ref 135–145)
Total Bilirubin: 4.4 mg/dL — ABNORMAL HIGH (ref 0.3–1.2)
Total Protein: 6.9 g/dL (ref 6.5–8.1)

## 2021-09-27 LAB — CBC WITH DIFFERENTIAL/PLATELET
Abs Immature Granulocytes: 0.17 10*3/uL — ABNORMAL HIGH (ref 0.00–0.07)
Basophils Absolute: 0.1 10*3/uL (ref 0.0–0.1)
Basophils Relative: 1 %
Eosinophils Absolute: 0.1 10*3/uL (ref 0.0–0.5)
Eosinophils Relative: 1 %
HCT: 42.4 % (ref 39.0–52.0)
Hemoglobin: 14 g/dL (ref 13.0–17.0)
Immature Granulocytes: 2 %
Lymphocytes Relative: 5 %
Lymphs Abs: 0.5 10*3/uL — ABNORMAL LOW (ref 0.7–4.0)
MCH: 30.1 pg (ref 26.0–34.0)
MCHC: 33 g/dL (ref 30.0–36.0)
MCV: 91.2 fL (ref 80.0–100.0)
Monocytes Absolute: 0.5 10*3/uL (ref 0.1–1.0)
Monocytes Relative: 5 %
Neutro Abs: 8.9 10*3/uL — ABNORMAL HIGH (ref 1.7–7.7)
Neutrophils Relative %: 86 %
Platelets: 232 10*3/uL (ref 150–400)
RBC: 4.65 MIL/uL (ref 4.22–5.81)
RDW: 13.9 % (ref 11.5–15.5)
WBC: 10.3 10*3/uL (ref 4.0–10.5)
nRBC: 0 % (ref 0.0–0.2)

## 2021-09-27 LAB — URINALYSIS, ROUTINE W REFLEX MICROSCOPIC
Bilirubin Urine: NEGATIVE
Glucose, UA: NEGATIVE mg/dL
Hgb urine dipstick: NEGATIVE
Ketones, ur: NEGATIVE mg/dL
Leukocytes,Ua: NEGATIVE
Nitrite: NEGATIVE
Protein, ur: NEGATIVE mg/dL
Specific Gravity, Urine: 1.007 (ref 1.005–1.030)
pH: 7.5 (ref 5.0–8.0)

## 2021-09-27 LAB — LIPASE, BLOOD: Lipase: 14 U/L (ref 11–51)

## 2021-09-27 MED ORDER — LACTATED RINGERS IV BOLUS
1000.0000 mL | Freq: Once | INTRAVENOUS | Status: AC
  Administered 2021-09-27: 1000 mL via INTRAVENOUS

## 2021-09-27 MED ORDER — IOHEXOL 300 MG/ML  SOLN
100.0000 mL | Freq: Once | INTRAMUSCULAR | Status: AC | PRN
Start: 2021-09-27 — End: 2021-09-27
  Administered 2021-09-27: 100 mL via INTRAVENOUS

## 2021-09-27 NOTE — ED Notes (Signed)
Dc instructions reviewed with patient. Patient voiced understanding. Dc with belongings.  °

## 2021-09-27 NOTE — Discharge Instructions (Signed)
So all of the images today look okay.  It does not appear that there are any gallstone stuck anywhere and the CAT scan was otherwise normal.  If you start having fever, vomiting, abdominal pain you need to go to [facility] or [facility].  If not plan on following up with the GI doctor as planned.

## 2021-09-27 NOTE — ED Notes (Signed)
Patient transported to CT 

## 2021-09-27 NOTE — ED Notes (Signed)
RT Note:Nasal Swab obtained/labelled/given to lab.

## 2021-09-27 NOTE — ED Triage Notes (Signed)
Pt reports " My skin turned yellow yesterday and my urine is darker."

## 2021-09-27 NOTE — ED Provider Notes (Signed)
[facility] EMERGENCY DEPT Provider Note   CSN: [id number] Arrival date & time: 09/27/21  4742     History  Chief Complaint  Patient presents with   Jaundice    [name] is a 81 y.o. male.  Patient is a 81 year old male with a history of gastric outlet obstruction, sick sinus syndrome status post pacemaker, COPD, GERD who is presenting today with his wife because she noticed this morning he had turned yellow.  Patient reports that over the last few weeks he has had cough and congestion.  He was seen in the emergency room on 09/19/2021 for URI symptoms.  He reports he was given azithromycin at that time and he completed the course of that approximately 4 days ago.  He has continued to have cough with some minimal green sputum but has not had significant shortness of breath.  He does report over the last few days he had had some abdominal pain with some dry heaving but reports now the abdominal pain is gone.  He denies any nausea at this time and reports he had several significant bowel movements this morning and felt better.  He has had prior appendectomy but no other abdominal surgeries except for hematoma complicating the appendectomy that they report had to be sucked out.  He is noticed today that his urine has been very dark but he had no dysuria, frequency or urgency.  He has had decreased appetite but has still been eating.  Wife denies any confusion.  He has not been febrile that he is aware of.  The history is provided by the patient, medical records and the spouse.      Home Medications Prior to Admission medications   Medication Sig Start Date End Date Taking? Authorizing Provider  abiraterone acetate (ZYTIGA) 250 MG tablet TAKE 4 TABLETS (1,000 MG TOTAL) BY MOUTH DAILY. TAKE ON AN EMPTY STOMACH 1 HOUR BEFORE OR 2 HOURS AFTER A MEAL 08/29/21 08/29/22  Wyatt Portela, MD  acetaminophen (TYLENOL) 500 MG tablet Take 1,000 mg by mouth every 6 (six) hours as  needed for headache or moderate pain (pain).    [provider]  albuterol (VENTOLIN HFA) 108 (90 Base) MCG/ACT inhaler TAKE 2 PUFFS BY MOUTH EVERY 6 HOURS AS NEEDED FOR WHEEZE OR SHORTNESS OF BREATH Patient taking differently: Inhale 2 puffs into the lungs every 6 (six) hours as needed for wheezing or shortness of breath. 04/16/21   Baird Lyons D, MD  amLODipine (NORVASC) 2.5 MG tablet TAKE 1 TABLET BY MOUTH EVERY DAY Patient taking differently: Take 2.5 mg by mouth daily. 01/27/21   [name], [name], MD  Ascorbic Acid (VITAMIN C PO) Take 1 tablet by mouth daily.    [provider]  aspirin EC 81 MG tablet Take 81 mg by mouth daily.    [provider]  atorvastatin (LIPITOR) 20 MG tablet TAKE 1 TABLET BY MOUTH EVERY DAY Patient taking differently: Take 40 mg by mouth daily. 04/17/21   Croitoru, Mihai, MD  azithromycin (ZITHROMAX) 250 MG tablet Take 1 tablet (250 mg total) by mouth daily. Take first 2 tablets together, then 1 every day until finished. 09/19/21   Davonna Belling, MD  budesonide-formoterol [facility]) 160-4.5 MCG/ACT inhaler Inhale 2 puffs then rinse mouth, twice daily Patient taking differently: Inhale 2 puffs into the lungs 2 (two) times daily. Inhale 2 puffs then rinse mouth, twice daily 04/16/21   Baird Lyons D, MD  carbamide peroxide (DEBROX) 6.5 % OTIC solution Place  5 drops into both ears daily as needed (for earwax).    [provider]  citalopram (CELEXA) 10 MG tablet TAKE 1 TABLET BY MOUTH EVERY DAY Patient taking differently: Take 10 mg by mouth daily. 01/27/21   [name], [name], MD  diclofenac Sodium (VOLTAREN) 1 % GEL AAPLY 4 GRAMS TOPICALLY 4 TIMES DAILY Patient taking differently: Apply 2 g topically daily as needed (pain). 02/14/21   [name], [name], MD  docusate sodium (COLACE) 100 MG capsule Take 100 mg by mouth daily with supper.    [provider]  fluticasone (FLONASE) 50 MCG/ACT nasal spray PLACE 1 SPRAY INTO BOTH NOSTRILS  DAILY AS NEEDED FOR ALLERGIES OR RHINITIS (AT BEDTIME). Patient taking differently: Place 1 spray into both nostrils at bedtime as needed for allergies or rhinitis. 09/04/19   [name], [name], MD  furosemide (LASIX) 20 MG tablet TAKE 1 TABLET BY MOUITH DAILY FOR 5 DAYS , THEN DAILY AS NEEDED FOR EDEMA Patient taking differently: Take 20 mg by mouth daily. 07/01/21   [name], [name], MD  LORazepam (ATIVAN) 1 MG tablet TAKE 1 TABLET BY MOUTH TWICE A DAY AS NEEDED. DNFB 6/18 Patient taking differently: Take 1 mg by mouth daily. 07/02/21   [name], [name], MD  metoprolol tartrate (LOPRESSOR) 25 MG tablet TAKE 1 TABLET BY MOUTH TWICE A DAY Patient taking differently: Take 25 mg by mouth 2 (two) times daily. 09/30/20   Croitoru, Mihai, MD  Multiple Vitamin (MULTIVITAMIN WITH MINERALS) TABS tablet Take 0.5 tablets by mouth daily. Centrum    [provider]  omeprazole (PRILOSEC) 20 MG capsule TAKE 1 CAPSULE (20 MG TOTAL) BY MOUTH DAILY BEFORE BREAKFAST. 04/18/21   [name], [name], MD  potassium chloride 20 MEQ TBCR Take 10 mEq by mouth 2 (two) times daily. 09/19/21   Davonna Belling, MD  predniSONE (DELTASONE) 20 MG tablet Take 2 tablets (40 mg total) by mouth daily. 09/19/21   Davonna Belling, MD  predniSONE (DELTASONE) 5 MG tablet TAKE 1 TABLET BY MOUTH EVERY DAY WITH BREAKFAST 09/04/21   Wyatt Portela, MD  Tetrahydrozoline HCl (VISINE OP) Place 1 drop into both eyes daily as needed (red eyes).    [provider]      Allergies    Penicillins    Review of Systems   Review of Systems  Physical Exam Updated Vital Signs BP 129/88 (BP Location: Right Arm)    Pulse 70    Temp 98.6 F (37 C) (Oral)    Resp 16    SpO2 96%  Physical Exam Vitals and nursing note reviewed.  Constitutional:      General: He is not in acute distress.    Appearance: He is well-developed.  HENT:     Head: Normocephalic and atraumatic.  Eyes:     General: Scleral icterus present.      Conjunctiva/sclera: Conjunctivae normal.     Pupils: Pupils are equal, round, and reactive to light.  Cardiovascular:     Rate and Rhythm: Normal rate and regular rhythm.     Heart sounds: No murmur heard. Pulmonary:     Effort: Pulmonary effort is normal. No respiratory distress.     Breath sounds: Normal breath sounds. No wheezing or rales.  Abdominal:     General: There is distension.     Palpations: Abdomen is soft.     Tenderness: There is no abdominal tenderness. There is no guarding or rebound.  Musculoskeletal:  General: No tenderness. Normal range of motion.     Cervical back: Normal range of motion and neck supple.     Right lower leg: No edema.     Left lower leg: No edema.  Skin:    General: Skin is warm and dry.     Coloration: Skin is jaundiced.     Findings: No erythema or rash.  Neurological:     Mental Status: He is alert and oriented to person, place, and time. Mental status is at baseline.  Psychiatric:        Mood and Affect: Mood normal.        Behavior: Behavior normal.    ED Results / Procedures / Treatments   Labs (all labs ordered are listed, but only abnormal results are displayed) Labs Reviewed  CBC WITH DIFFERENTIAL/PLATELET - Abnormal; Notable for the following components:      Result Value   Neutro Abs 8.9 (*)    Lymphs Abs 0.5 (*)    Abs Immature Granulocytes 0.17 (*)    All other components within normal limits  COMPREHENSIVE METABOLIC PANEL - Abnormal; Notable for the following components:   Glucose, Bld 111 (*)    Calcium 8.8 (*)    AST 147 (*)    ALT 227 (*)    Alkaline Phosphatase 206 (*)    Total Bilirubin 4.4 (*)    All other components within normal limits  RESP PANEL BY RT-PCR (FLU A&B, COVID) ARPGX2  URINALYSIS, ROUTINE W REFLEX MICROSCOPIC  LIPASE, BLOOD  HEPATITIS PANEL, ACUTE    EKG None  Radiology DG Chest Port 1 View  Result Date: 09/27/2021 CLINICAL DATA:  81 year old male with history of cough. EXAM:  PORTABLE CHEST 1 VIEW COMPARISON:  Chest x-ray 09/19/2021. FINDINGS: Diffuse peribronchial cuffing and widespread interstitial prominence with some patchy ill-defined opacities in the lungs bilaterally, most severe in the left lower lobe. No pleural effusions. No pneumothorax. Pulmonary vasculature does not appearing origin. Mild cardiomegaly. The patient is rotated to the right on today's exam, resulting in distortion of the mediastinal contours and reduced diagnostic sensitivity and specificity for mediastinal pathology. Atherosclerotic calcifications are noted in the thoracic aorta. Left-sided pacemaker device in place with lead tips projecting over the expected location of the right atrium and right ventricle. IMPRESSION: 1. The appearance the chest is concerning for bronchitis with potential developing multilobar bilateral bronchopneumonia. 2. Cardiomegaly. 3. Aortic atherosclerosis. Electronically Signed   By: Vinnie Langton M.D.   On: 09/27/2021 09:58    Procedures Procedures    Medications Ordered in ED Medications  lactated ringers bolus 1,000 mL (1,000 mLs Intravenous New Bag/Given 09/27/21 0943)  iohexol (OMNIPAQUE) 300 MG/ML solution 100 mL (100 mLs Intravenous Contrast Given 09/27/21 1113)    ED Course/ Medical Decision Making/ A&P                           Medical Decision Making Amount and/or Complexity of Data Reviewed Independent Historian: spouse External Data Reviewed: labs and notes. Labs: ordered. Decision-making details documented in ED Course. Radiology: ordered and independent interpretation performed. Decision-making details documented in ED Course.   Patient presenting today with evidence of jaundice.  His wife also provided information but patient has been having some abdominal pain in the last few days that is now resolved.  He does still have a gallbladder.  Concern for choledocho lithiasis versus cholangitis.  No evidence of bleeding suggestive of indirect  hyperbilirubinemia.  Patient  has no abdominal pain at this time.  He was recently on azithromycin and has had URI symptoms.  He does still complain of a productive cough but breath sounds are clear and oxygen saturation is 97% on room air.  He did have a COVID test done last week that was negative.  Based on patient's clinical exam findings and history labs and imaging are pending.  He was given IV fluids but at this time does not complain of anything else.  No prior history of similar.  4:12 PM . No acute abnormality. No biliary ductal dilatation. Suggestion of cholelithiasis, with no evidence of acute cholecystitis. 2. 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 Recommend follow-up every 12 months and vascular consultation. This recommendation follows ACR consensus guidelines: White Paper of the ACR Incidental Findings Committee II on Vascular Findings. J Am Coll Radiol 2013; 10:789-794. 3. Chronic mild diffuse bladder wall thickening, nonspecific. 4. Mild sigmoid diverticulosis. 5. Small to moderate fat containing right inguinal hernia. 6. Aortic Atherosclerosis (ICD10-I70.0).   I independently evaluated patient's labs today he has negative for COVID and flu, UA without acute findings, CBC within normal limits, CMP with elevated LFTs with an AST of 147 and an ALT of 227 and a total bilirubin of 4.4 which is above his baseline which is normal, lipase is within normal limits.  I independently evaluated patient's CT and interpreted the radiology read which shows no acute abnormalities or ductal dilatation in the biliary duct.  There is a suggestion of cholelithiasis but no evidence of cholecystitis.  Patient does have an infrarenal aortic aneurysm that we will need follow-up every 12 months but no other acute findings.  He then had a right upper quadrant ultrasound that did show echogenic material in the dependent portion of the gallbladder lumen without acoustic shadowing which may suggest tiny  stones or sludge but there is no other acute findings.  Discussed the patient's case with Dr. Paulita Fujita with gastroenterology.  He recommended doing a acute viral hepatitis panel but given patient has normal mental status no fever and is otherwise well-appearing felt that he could be seen as an outpatient with close follow-up.  Patient's chest x-ray today based on my independent interpretation looks about the same but radiology was concerned for bronchitis versus possible multilobar or bilateral bronchopneumonia.  He was recently treated for a URI has completed a course of prednisone and azithromycin and reports the cough is improving.  He has been satting 100% on room air since he has been here and appears very comfortable.  Findings discussed with the patient and his wife and their questions were answered.  At this time patient does have multiple core morbidities but appears stable and does not meet criteria for admission.  However he was given strict return precautions if he develops fever, abdominal pain or vomiting.        Final Clinical Impression(s) / ED Diagnoses Final diagnoses:  Jaundice    Rx / DC Orders ED Discharge Orders     None         Blanchie Dessert, MD 09/27/21 1616

## 2021-09-28 LAB — HEPATITIS PANEL, ACUTE
HCV Ab: NONREACTIVE
Hep A IgM: NONREACTIVE
Hep B C IgM: NONREACTIVE
Hepatitis B Surface Ag: NONREACTIVE

## 2021-09-30 ENCOUNTER — Other Ambulatory Visit: Payer: Self-pay | Admitting: Cardiovascular Disease

## 2021-09-30 ENCOUNTER — Other Ambulatory Visit: Payer: Self-pay | Admitting: Family Medicine

## 2021-09-30 DIAGNOSIS — I10 Essential (primary) hypertension: Secondary | ICD-10-CM

## 2021-10-03 ENCOUNTER — Inpatient Hospital Stay: Payer: Medicare Other

## 2021-10-03 ENCOUNTER — Inpatient Hospital Stay: Payer: Medicare Other | Attending: Oncology

## 2021-10-03 ENCOUNTER — Inpatient Hospital Stay (HOSPITAL_BASED_OUTPATIENT_CLINIC_OR_DEPARTMENT_OTHER): Payer: Medicare Other | Admitting: Oncology

## 2021-10-03 ENCOUNTER — Other Ambulatory Visit: Payer: Self-pay

## 2021-10-03 VITALS — BP 91/55 | HR 79 | Temp 98.1°F | Resp 19 | Ht 68.0 in | Wt 219.1 lb

## 2021-10-03 DIAGNOSIS — Z5111 Encounter for antineoplastic chemotherapy: Secondary | ICD-10-CM | POA: Diagnosis not present

## 2021-10-03 DIAGNOSIS — C61 Malignant neoplasm of prostate: Secondary | ICD-10-CM | POA: Diagnosis not present

## 2021-10-03 DIAGNOSIS — E876 Hypokalemia: Secondary | ICD-10-CM | POA: Insufficient documentation

## 2021-10-03 DIAGNOSIS — I10 Essential (primary) hypertension: Secondary | ICD-10-CM | POA: Diagnosis not present

## 2021-10-03 LAB — CMP (CANCER CENTER ONLY)
ALT: 47 U/L — ABNORMAL HIGH (ref 0–44)
AST: 25 U/L (ref 15–41)
Albumin: 3.8 g/dL (ref 3.5–5.0)
Alkaline Phosphatase: 133 U/L — ABNORMAL HIGH (ref 38–126)
Anion gap: 12 (ref 5–15)
BUN: 18 mg/dL (ref 8–23)
CO2: 23 mmol/L (ref 22–32)
Calcium: 8.9 mg/dL (ref 8.9–10.3)
Chloride: 102 mmol/L (ref 98–111)
Creatinine: 1.12 mg/dL (ref 0.61–1.24)
GFR, Estimated: >60 mL/min (ref >60)
Glucose, Bld: 128 mg/dL — ABNORMAL HIGH (ref 70–99)
Potassium: 4.2 mmol/L (ref 3.5–5.1)
Sodium: 137 mmol/L (ref 135–145)
Total Bilirubin: 1.1 mg/dL (ref 0.3–1.2)
Total Protein: 6.9 g/dL (ref 6.5–8.1)

## 2021-10-03 LAB — CBC WITH DIFFERENTIAL (CANCER CENTER ONLY)
Abs Immature Granulocytes: 0.19 10*3/uL — ABNORMAL HIGH (ref 0.00–0.07)
Basophils Absolute: 0.1 10*3/uL (ref 0.0–0.1)
Basophils Relative: 1 %
Eosinophils Absolute: 0.1 10*3/uL (ref 0.0–0.5)
Eosinophils Relative: 1 %
HCT: 40.2 % (ref 39.0–52.0)
Hemoglobin: 13.5 g/dL (ref 13.0–17.0)
Immature Granulocytes: 2 %
Lymphocytes Relative: 6 %
Lymphs Abs: 0.6 10*3/uL — ABNORMAL LOW (ref 0.7–4.0)
MCH: 30.3 pg (ref 26.0–34.0)
MCHC: 33.6 g/dL (ref 30.0–36.0)
MCV: 90.3 fL (ref 80.0–100.0)
Monocytes Absolute: 1 10*3/uL (ref 0.1–1.0)
Monocytes Relative: 10 %
Neutro Abs: 7.6 10*3/uL (ref 1.7–7.7)
Neutrophils Relative %: 80 %
Platelet Count: 236 10*3/uL (ref 150–400)
RBC: 4.45 MIL/uL (ref 4.22–5.81)
RDW: 14.2 % (ref 11.5–15.5)
WBC Count: 9.5 10*3/uL (ref 4.0–10.5)
nRBC: 0 % (ref 0.0–0.2)

## 2021-10-03 MED ORDER — LEUPROLIDE ACETATE (3 MONTH) 22.5 MG ~~LOC~~ KIT
22.5000 mg | PACK | Freq: Once | SUBCUTANEOUS | Status: AC
  Administered 2021-10-03: 22.5 mg via SUBCUTANEOUS
  Filled 2021-10-03: qty 22.5

## 2021-10-03 NOTE — Progress Notes (Signed)
Hematology and Oncology Follow Up Visit  Tony [name] [id number] 07-Aug-1941 81 y.o. 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  He initially presented with Gleason score 4+5 = 9 and a PSA 15.7 in 2015.     Prior Therapy:  He is status post radiation therapy for external beam radiation completed in September 2015 for a total of 45 gray. He also status post seed implant brachytherapy boost completed in October 2015.   He received total of 1 year of androgen deprivation 22,015 and 2016. His PSA nadir was to 0.47 in November 2016. His PSA was 4.08 in May 2017.  Current therapy:   Eligard 22.5 mg every 3 months.  This will be repeated today.  Zytiga 1000 mg with prednisone started in August 2017.   Interim History: Tony [name] presents today for a follow-up visit.  Since the last visit, he reports no major changes in his health.  He denies any recent hospitalizations or illnesses.  He denies any complications related to Zytiga.  He denies any nausea, vomiting or abdominal pain.  He denies any bone pain or pathological fractures.  He denies any dyspnea on exertion but has reported some occasional cough.  He is ambulating with the help of a cane.        Medications: Updated on review. Current Outpatient Medications  Medication Sig Dispense Refill   abiraterone acetate (ZYTIGA) 250 MG tablet TAKE 4 TABLETS (1,000 MG TOTAL) BY MOUTH DAILY. TAKE ON AN EMPTY STOMACH 1 HOUR BEFORE OR 2 HOURS AFTER A MEAL 120 tablet 2   acetaminophen (TYLENOL) 500 MG tablet Take 1,000 mg by mouth every 6 (six) hours as needed for headache or moderate pain (pain).     albuterol (VENTOLIN HFA) 108 (Tony Base) MCG/ACT inhaler TAKE 2 PUFFS BY MOUTH EVERY 6 HOURS AS NEEDED FOR WHEEZE OR SHORTNESS OF BREATH (Patient taking differently: Inhale 2 puffs into the lungs every  6 (six) hours as needed for wheezing or shortness of breath.) 18 each 12   amLODipine (NORVASC) 2.5 MG tablet TAKE 1 TABLET BY MOUTH EVERY DAY Tony tablet 2   Ascorbic Acid (VITAMIN C PO) Take 1 tablet by mouth daily.     aspirin EC 81 MG tablet Take 81 mg by mouth daily.     atorvastatin (LIPITOR) 20 MG tablet TAKE 1 TABLET BY MOUTH EVERY DAY (Patient taking differently: Take 40 mg by mouth daily.) Tony tablet 2   azithromycin (ZITHROMAX) 250 MG tablet Take 1 tablet (250 mg total) by mouth daily. Take first 2 tablets together, then 1 every day until finished. 6 tablet 0   budesonide-formoterol (SYMBICORT) 160-4.5 MCG/ACT inhaler Inhale 2 puffs then rinse mouth, twice daily (Patient taking differently: Inhale 2 puffs into the lungs 2 (two) times daily. Inhale 2 puffs then rinse mouth, twice daily) 1 each 12   carbamide peroxide (DEBROX) 6.5 % OTIC solution Place 5 drops into both ears daily as needed (for earwax).     citalopram (CELEXA) 10 MG tablet TAKE 1 TABLET BY MOUTH EVERY DAY (Patient taking differently: Take 10 mg by mouth daily.) Tony tablet 3   diclofenac Sodium (VOLTAREN) 1 % GEL AAPLY 4 GRAMS TOPICALLY 4 TIMES DAILY (Patient taking differently: Apply 2 [name] topically daily as needed (pain).) 400 [name] 2   docusate sodium (COLACE) 100 MG capsule Take 100 mg by mouth daily with supper.  fluticasone (FLONASE) 50 MCG/ACT nasal spray PLACE 1 SPRAY INTO BOTH NOSTRILS DAILY AS NEEDED FOR ALLERGIES OR RHINITIS (AT BEDTIME). (Patient taking differently: Place 1 spray into both nostrils at bedtime as needed for allergies or rhinitis.) 48 mL 1   furosemide (LASIX) 20 MG tablet TAKE 1 TABLET BY MOUITH DAILY FOR 5 DAYS , THEN DAILY AS NEEDED FOR EDEMA (Patient taking differently: Take 20 mg by mouth daily.) Tony tablet 1   LORazepam (ATIVAN) 1 MG tablet TAKE 1 TABLET BY MOUTH TWICE A DAY AS NEEDED. DNFB 6/18 (Patient taking differently: Take 1 mg by mouth daily.) 60 tablet 3   metoprolol tartrate (LOPRESSOR) 25 MG  tablet TAKE 1 TABLET BY MOUTH TWICE A DAY 180 tablet 2   Multiple Vitamin (MULTIVITAMIN WITH MINERALS) TABS tablet Take 0.5 tablets by mouth daily. Centrum     omeprazole (PRILOSEC) 20 MG capsule TAKE 1 CAPSULE (20 MG TOTAL) BY MOUTH DAILY BEFORE BREAKFAST. Tony capsule 3   potassium chloride 20 MEQ TBCR Take 10 mEq by mouth 2 (two) times daily. 10 tablet 0   predniSONE (DELTASONE) 20 MG tablet Take 2 tablets (40 mg total) by mouth daily. 6 tablet 0   predniSONE (DELTASONE) 5 MG tablet TAKE 1 TABLET BY MOUTH EVERY DAY WITH BREAKFAST Tony tablet 0   Tetrahydrozoline HCl (VISINE OP) Place 1 drop into both eyes daily as needed (red eyes).     No current facility-administered medications for this visit.     Allergies:  Allergies  Allergen Reactions   Penicillins Rash    Has patient had a PCN reaction causing immediate rash, facial/tongue/throat swelling, SOB or lightheadedness with hypotension: Yes Has patient had a PCN reaction causing severe rash involving mucus membranes or skin necrosis: No Has patient had a PCN reaction that required hospitalization: No Has patient had a PCN reaction occurring within the last 10 years: No If all of the above answers are "NO", then may proceed with Cephalosporin use.      Physical Exam:      Blood pressure (!) 91/55, pulse 79, temperature 98.1 F (36.7 C), temperature source Temporal, resp. rate 19, height 5\' 8"  (1.727 m), weight 219 lb 1.6 oz (99.4 kg), SpO2 93 %.      ECOG: 1     General appearance: Alert, awake without any distress. Head: Atraumatic without abnormalities Oropharynx: Without any thrush or ulcers. Eyes: No scleral icterus. Lymph nodes: No lymphadenopathy noted in the cervical, supraclavicular, or axillary nodes Heart:regular rate and rhythm, without any murmurs or gallops.   Lung: Clear to auscultation without any rhonchi, wheezes or dullness to percussion. Abdomin: Soft, nontender without any shifting dullness or  ascites. Musculoskeletal: No clubbing or cyanosis. Neurological: No motor or sensory deficits. Skin: No rashes or lesions.                 Lab Results: Lab Results  Component Value Date   WBC 9.5 10/03/2021   HGB 13.5 10/03/2021   HCT 40.2 10/03/2021   MCV Tony.3 10/03/2021   PLT 236 10/03/2021     Chemistry      Component Value Date/Time   NA 136 09/27/2021 0925   NA 139 02/03/2019 0857   NA 141 08/27/2017 0915   K 3.5 09/27/2021 0925   K 4.8 08/27/2017 0915   CL 100 09/27/2021 0925   CO2 23 09/27/2021 0925   CO2 26 08/27/2017 0915   BUN 12 09/27/2021 0925   BUN 13 02/03/2019 0857   BUN  16.2 08/27/2017 0915   CREATININE 1.05 09/27/2021 0925   CREATININE 1.08 07/04/2021 0824   CREATININE 1.07 07/31/2020 1325   CREATININE 0.9 08/27/2017 0915   GLU 105 01/10/2016 0000      Component Value Date/Time   CALCIUM 8.8 (L) 09/27/2021 0925   CALCIUM 9.8 08/27/2017 0915   ALKPHOS 206 (H) 09/27/2021 0925   ALKPHOS 83 08/27/2017 0915   AST 147 (H) 09/27/2021 0925   AST 24 07/04/2021 0824   AST 23 08/27/2017 0915   ALT 227 (H) 09/27/2021 0925   ALT 17 07/04/2021 0824   ALT 17 08/27/2017 0915   BILITOT 4.4 (H) 09/27/2021 0925   BILITOT 1.0 07/04/2021 0824   BILITOT 0.95 08/27/2017 0915          Latest Reference Range & Units 04/02/21 08:19 07/04/21 08:24  Prostate Specific Ag, Serum 0.0 - 4.0 ng/mL <0.1 <0.1        Impression and Plan:   Tony [name] with:  1.  Castration-sensitive advanced prostate cancer with lymphadenopathy diagnosed in 2017.    He continues to experience excellent clinical response to [facility] and currently has an undetectable PSA.  Risks and benefits of continuing this treatment were discussed at this time.  Potential complications that include edema, fatigue among others.  He is agreeable to continuing different salvage therapy options will be deferred if he developed progression of disease.   3. Hypokalemia: Related to  Zytiga and will replace as needed.  His potassium has been within normal range.  4. Hypertension: We will continue to monitor on Zytiga and currently close to normal range.  5.  Prognosis and goals of care: Therapy remains palliative although aggressive measures are warranted.  6.  Androgen deprivation therapy: He will receive Eligard today and repeated in 3 months.  Complication clued weight gain, hot flashes among others were reiterated.   7. Follow-up: He will return in 3 months for a follow-up visit.  30  minutes were spent on this visit.  The time was dedicated to reviewing laboratory data, disease status update and outlining future plan of care discussion.  Zola Button, MD 1/20/20238:36 AM

## 2021-10-04 LAB — PROSTATE-SPECIFIC AG, SERUM (LABCORP): Prostate Specific Ag, Serum: <0.1 ng/mL (ref 0.0–4.0)

## 2021-10-07 ENCOUNTER — Telehealth: Payer: Self-pay | Admitting: *Deleted

## 2021-10-07 DIAGNOSIS — G4733 Obstructive sleep apnea (adult) (pediatric): Secondary | ICD-10-CM | POA: Diagnosis not present

## 2021-10-07 DIAGNOSIS — R269 Unspecified abnormalities of gait and mobility: Secondary | ICD-10-CM | POA: Diagnosis not present

## 2021-10-07 NOTE — Telephone Encounter (Signed)
Per Dr.Shadad, called pt with message below. Pt wife verbalized understanding and appreciative.

## 2021-10-07 NOTE — Telephone Encounter (Signed)
-----   Message from Tony Portela, MD sent at 10/06/2021  8:29 AM EST ----- Please let him know his PSA is still low

## 2021-10-15 DIAGNOSIS — R269 Unspecified abnormalities of gait and mobility: Secondary | ICD-10-CM | POA: Diagnosis not present

## 2021-10-15 DIAGNOSIS — G4733 Obstructive sleep apnea (adult) (pediatric): Secondary | ICD-10-CM | POA: Diagnosis not present

## 2021-10-20 ENCOUNTER — Other Ambulatory Visit (HOSPITAL_COMMUNITY): Payer: Self-pay

## 2021-10-22 DIAGNOSIS — R933 Abnormal findings on diagnostic imaging of other parts of digestive tract: Secondary | ICD-10-CM | POA: Diagnosis not present

## 2021-10-22 DIAGNOSIS — K8020 Calculus of gallbladder without cholecystitis without obstruction: Secondary | ICD-10-CM | POA: Diagnosis not present

## 2021-10-22 DIAGNOSIS — K2270 Barrett's esophagus without dysplasia: Secondary | ICD-10-CM | POA: Diagnosis not present

## 2021-10-24 ENCOUNTER — Other Ambulatory Visit: Payer: Self-pay | Admitting: Gastroenterology

## 2021-10-24 DIAGNOSIS — R198 Other specified symptoms and signs involving the digestive system and abdomen: Secondary | ICD-10-CM

## 2021-10-27 ENCOUNTER — Encounter: Payer: Self-pay | Admitting: Family Medicine

## 2021-10-27 ENCOUNTER — Ambulatory Visit (INDEPENDENT_AMBULATORY_CARE_PROVIDER_SITE_OTHER): Payer: Medicare Other | Admitting: Family Medicine

## 2021-10-27 VITALS — BP 118/70 | HR 91 | Resp 16 | Ht 68.0 in | Wt 227.0 lb

## 2021-10-27 DIAGNOSIS — I700 Atherosclerosis of aorta: Secondary | ICD-10-CM | POA: Diagnosis not present

## 2021-10-27 DIAGNOSIS — J449 Chronic obstructive pulmonary disease, unspecified: Secondary | ICD-10-CM | POA: Diagnosis not present

## 2021-10-27 DIAGNOSIS — R198 Other specified symptoms and signs involving the digestive system and abdomen: Secondary | ICD-10-CM | POA: Diagnosis not present

## 2021-10-27 DIAGNOSIS — I10 Essential (primary) hypertension: Secondary | ICD-10-CM | POA: Diagnosis not present

## 2021-10-27 DIAGNOSIS — R17 Unspecified jaundice: Secondary | ICD-10-CM | POA: Diagnosis not present

## 2021-10-27 NOTE — Progress Notes (Signed)
ACUTE VISIT Chief Complaint  Patient presents with   Follow-up    Has endoscopy procedure coming up    HPI: Tony [name] is a 81 y.o. male with hx of COPD,HLD,prostate cancer,anxiety,HTN,sick sinus synd,trifascicular block s/p pacemaker placement, AAA w/o rupture, and OSA here today with his wife and granddaughter to discuss oncoming procedure risks. 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 He has also had some dysphagia for solids and liquids (per records review) He is on Omeprazole 20 mg daily.  He was evaluated in the ED on 09/27/21 because jaundice. Abdominal/pelvic CT: No acute abnormality. No biliary ductal dilatation. Suggestion of cholelithiasis, with no evidence of acute cholecystitis. Abdominal US: Echogenic material is seen in the dependent portion of gallbladder lumen without acoustic shadowing. This may suggest presence of sludge and possibly tiny stones. There are no imaging signs of acute cholecystitis. There is no dilation of bile ducts. Mild sigmoid diverticulosis. Aortic atherosclerosis was also seen.  Evaluated by GI 10/22/21. Pending abdominal MRI to evaluate for possible choledocholithiasis, awaiting for cardiologist recommendations.  Lab Results  Component Value Date   ALT 47 (H) 10/03/2021   AST 25 10/03/2021   ALKPHOS 133 (H) 10/03/2021   BILITOT 1.1 10/03/2021   He denies abdominal pain,nausea,vomiting,or color changes in urine/stool. Jaundice has greatly improved, yesterday wife noted mild yellowish color on upper eyelids. Negative for fever,chills,or changes in appetite.  Lab Results  Component Value Date   WBC 9.5 10/03/2021   HGB 13.5 10/03/2021   HCT 40.2 10/03/2021   MCV 90.3 10/03/2021   PLT 236 10/03/2021   HLD on Atorvastatin 20 mg daily. Lab Results  Component Value Date   CHOL 158 06/23/2021   HDL 67.70 06/23/2021   LDLCALC 77 06/23/2021   TRIG 68.0 06/23/2021   CHOLHDL 2  06/23/2021   HTN on Metoprolol tartrate 25 mg bid and Amlodipine 2.5 mg daily.  He has had some low BP's. He is not checking BP at home. According to wife and granddaughter, BP was 98/51 during GI visit. 10/22/21 was 99/65. He has not noted CP,palpitations,SOB,orthopnea,PND,worsening edema,or lightheadedness.  Lab Results  Component Value Date   CREATININE 1.12 10/03/2021   BUN 18 10/03/2021   NA 137 10/03/2021   K 4.2 10/03/2021   CL 102 10/03/2021   CO2 23 10/03/2021   Evaluated for URI on 09/19/21 in the ED, he is reporting that cough has resolved. COPD on Albuterol inh , which he has not used in weeks, and Symbicort 160-4.5 mcg bid.  Review of Systems  Constitutional:  Positive for fatigue. Negative for activity change.  HENT:  Negative for nosebleeds, sore throat and trouble swallowing.   Eyes:  Negative for redness and visual disturbance.  Respiratory:  Negative for cough and wheezing.   Gastrointestinal:  Negative for abdominal distention.       Negative for changes in bowel habits.  Genitourinary:  Negative for decreased urine volume, dysuria and hematuria.  Neurological:  Negative for syncope, weakness and headaches.  Rest see pertinent positives and negatives per HPI.  Current Outpatient Medications on File Prior to Visit  Medication Sig Dispense Refill   abiraterone acetate (ZYTIGA) 250 MG tablet TAKE 4 TABLETS (1,000 MG TOTAL) BY MOUTH DAILY. TAKE ON AN EMPTY STOMACH 1 HOUR BEFORE OR 2 HOURS AFTER A MEAL 120 tablet 2   acetaminophen (TYLENOL) 500 MG tablet Take 1,000 mg by mouth every 6 (six) hours as needed for headache  or moderate pain (pain).     albuterol (VENTOLIN HFA) 108 (90 Base) MCG/ACT inhaler TAKE 2 PUFFS BY MOUTH EVERY 6 HOURS AS NEEDED FOR WHEEZE OR SHORTNESS OF BREATH (Patient taking differently: Inhale 2 puffs into the lungs every 6 (six) hours as needed for wheezing or shortness of breath.) 18 each 12   Ascorbic Acid (VITAMIN C PO) Take 1 tablet by mouth  daily.     aspirin EC 81 MG tablet Take 81 mg by mouth daily.     atorvastatin (LIPITOR) 20 MG tablet TAKE 1 TABLET BY MOUTH EVERY DAY (Patient taking differently: Take 40 mg by mouth daily.) 90 tablet 2   budesonide-formoterol (SYMBICORT) 160-4.5 MCG/ACT inhaler Inhale 2 puffs then rinse mouth, twice daily (Patient taking differently: Inhale 2 puffs into the lungs 2 (two) times daily. Inhale 2 puffs then rinse mouth, twice daily) 1 each 12   carbamide peroxide (DEBROX) 6.5 % OTIC solution Place 5 drops into both ears daily as needed (for earwax).     citalopram (CELEXA) 10 MG tablet TAKE 1 TABLET BY MOUTH EVERY DAY (Patient taking differently: Take 10 mg by mouth daily.) 90 tablet 3   diclofenac Sodium (VOLTAREN) 1 % GEL AAPLY 4 GRAMS TOPICALLY 4 TIMES DAILY (Patient taking differently: Apply 2 g topically daily as needed (pain).) 400 g 2   docusate sodium (COLACE) 100 MG capsule Take 100 mg by mouth daily with supper.     fluticasone (FLONASE) 50 MCG/ACT nasal spray PLACE 1 SPRAY INTO BOTH NOSTRILS DAILY AS NEEDED FOR ALLERGIES OR RHINITIS (AT BEDTIME). (Patient taking differently: Place 1 spray into both nostrils at bedtime as needed for allergies or rhinitis.) 48 mL 1   furosemide (LASIX) 20 MG tablet TAKE 1 TABLET BY MOUITH DAILY FOR 5 DAYS , THEN DAILY AS NEEDED FOR EDEMA (Patient taking differently: Take 20 mg by mouth daily.) 90 tablet 1   LORazepam (ATIVAN) 1 MG tablet TAKE 1 TABLET BY MOUTH TWICE A DAY AS NEEDED. DNFB 6/18 (Patient taking differently: Take 1 mg by mouth daily.) 60 tablet 3   metoprolol tartrate (LOPRESSOR) 25 MG tablet TAKE 1 TABLET BY MOUTH TWICE A DAY 180 tablet 2   Multiple Vitamin (MULTIVITAMIN WITH MINERALS) TABS tablet Take 0.5 tablets by mouth daily. Centrum     omeprazole (PRILOSEC) 20 MG capsule TAKE 1 CAPSULE (20 MG TOTAL) BY MOUTH DAILY BEFORE BREAKFAST. 90 capsule 3   Tetrahydrozoline HCl (VISINE OP) Place 1 drop into both eyes daily as needed (red eyes).     No  current facility-administered medications on file prior to visit.   Past Medical History:  Diagnosis Date   COPD (chronic obstructive pulmonary disease) ([employer])    Gastric outlet obstruction 02/03/2018   [name] 02/03/2018   GERD (gastroesophageal reflux disease)    History of hiatal hernia    small/notes 02/03/2018   Hypertension    no meds   Presence of permanent cardiac pacemaker    Prostate cancer ([place]) 02/08/2014   Gleason 4+5=9, PSA 15.65   Radiation    Sinus problem    SSS (sick sinus syndrome) ([facility])    Allergies  Allergen Reactions   Penicillins Rash    Has patient had a PCN reaction causing immediate rash, facial/tongue/throat swelling, SOB or lightheadedness with hypotension: Yes Has patient had a PCN reaction causing severe rash involving mucus membranes or skin necrosis: No Has patient had a PCN reaction that required hospitalization: No Has patient had a PCN reaction occurring within the  last 10 years: No If all of the above answers are "NO", then may proceed with Cephalosporin use.    Social History   Socioeconomic History   Marital status: Married    Spouse name: Not on file   Number of children: 3   Years of education: Not on file   Highest education level: Not on file  Occupational History   Occupation: Freight forwarder    Employer: [employer]    Comment: retired   Occupation: Freight forwarder    Comment: [employer]-[place]  Tobacco Use   Smoking status: Former    Types: Cigars    Quit date: 09/14/2012    Years since quitting: 9.1   Smokeless tobacco: Never   Tobacco comments:    little cigars; the small ones"  Vaping Use   Vaping Use: Never used  Substance and Sexual Activity   Alcohol use: Yes    Alcohol/week: 14.0 standard drinks    Types: 14 Cans of beer per week   Drug use: No   Sexual activity: Not Currently  Other Topics Concern   Not on file  Social History Narrative   Lives with wife and 2 dogs in one level home; daughter and her family co-habitate.    Has three daughters, all in [place], supportive. Five grandchildren.   Wants to return to doing silver sneakers once gym re-opens.         Social Determinants of Health   Financial Resource Strain: Low Risk    Difficulty of Paying Living Expenses: Not hard at all  Food Insecurity: No Food Insecurity   Worried About Charity fundraiser in the Last Year: Never true   [place] in the Last Year: Never true  Transportation Needs: No Transportation Needs   Lack of Transportation (Medical): No   Lack of Transportation (Non-Medical): No  Physical Activity: Insufficiently Active   Days of Exercise per Week: 5 days   Minutes of Exercise per Session: 20 min  Stress: No Stress Concern Present   Feeling of Stress : Not at all  Social Connections: Moderately Integrated   Frequency of Communication with Friends and Family: More than three times a week   Frequency of Social Gatherings with Friends and Family: More than three times a week   Attends Religious Services: More than 4 times per year   Active Member of Clubs or Organizations: No   Attends Archivist Meetings: Not on file   Marital Status: Married   Vitals:   10/27/21 0843  BP: 118/70  Pulse: 91  Resp: 16  SpO2: 91%   Body mass index is 34.52 kg/m.  Physical Exam Nursing note reviewed.  Constitutional:      General: He is not in acute distress.    Appearance: He is well-developed.  HENT:     Head: Normocephalic and atraumatic.  Eyes:     General: No scleral icterus.    Conjunctiva/sclera: Conjunctivae normal.  Cardiovascular:     Rate and Rhythm: Normal rate. Rhythm irregular.     Heart sounds: No murmur heard.    Comments: Trace PT pulses present.itting LE edema, bilateral. Pulmonary:     Effort: Pulmonary effort is normal. No respiratory distress.     Breath sounds: Normal breath sounds.  Abdominal:     Palpations: Abdomen is soft. There is no hepatomegaly or mass.     Tenderness: There is no  abdominal tenderness.  Lymphadenopathy:     Cervical: No cervical adenopathy.  Skin:  General: Skin is warm.     Findings: No erythema or rash.     Comments: Minimal yellowish upper eye lid coloration.  Neurological:     Mental Status: He is alert and oriented to person, place, and time.     Cranial Nerves: No cranial nerve deficit.     Gait: Gait normal.  Psychiatric:     Comments: Well groomed, good eye contact.   ASSESSMENT AND PLAN:  Tony [name] was seen today for follow-up.  Diagnoses and all orders for this visit:  COPD mixed type ([place]) Problem is well controlled. Continue Symbicort 160-4.5 mcg bid and Albuterol inh 1-2 puff qid prn. Continue swishing after Symbicort use.  Essential hypertension, benign He has had some low BP's, recommend stopping Amlodipine. Continue Metoprolol tartrate 25 mg bid. Low salt diet. Monitor BP at home. Instructed about warning signs.  Jaundice Improved, mildly elevated ALT and alk phosphatase. Tiny gallstones seen on CT and no biliary duct dilation, liver MRI has been recommended and waiting for cardiologist to make recommendations. Cholecystectomy has been recommended, not date yet. I do not think we need to repeat LFT's today. Monitor for worsening problems, for now no changes in statin dose but instructed to hold on Atorvastatin if jaundice gets worse. Following with GI.  Atherosclerosis of aorta ([place]) Seen on abdominal CT. On Atorvastatin 20 mg daily and Aspirin 81 mg daily.  Abnormal findings-gastrointestinal tract Planning on EGD on 10/30/21. Recommend stopping Aspirin 81 mg a week before in case Bx is needed.  Return if symptoms worsen or fail to improve, for Keep next appt.  Shamaria Kavan G. [name], MD  [facility]. [place] office.

## 2021-10-27 NOTE — Patient Instructions (Addendum)
A few things to remember from today's visit:  Essential hypertension, benign  Jaundice  If you need refills please call your pharmacy. Do not use My Chart to request refills or for acute issues that need immediate attention.   Stop Amlodipine 2.5 mg. Continue Metoprolol. Monitor blood pressure daily, let me know in 2 weeks about readings. If jaundice gets worse hold on Atorvastatin for a few weeks.  Please be sure medication list is accurate. If a new problem present, please set up appointment sooner than planned today.

## 2021-10-30 DIAGNOSIS — K2270 Barrett's esophagus without dysplasia: Secondary | ICD-10-CM | POA: Diagnosis not present

## 2021-10-30 DIAGNOSIS — K219 Gastro-esophageal reflux disease without esophagitis: Secondary | ICD-10-CM | POA: Diagnosis not present

## 2021-11-04 DIAGNOSIS — K219 Gastro-esophageal reflux disease without esophagitis: Secondary | ICD-10-CM | POA: Diagnosis not present

## 2021-11-05 ENCOUNTER — Other Ambulatory Visit (HOSPITAL_COMMUNITY): Payer: Self-pay | Admitting: Gastroenterology

## 2021-11-05 DIAGNOSIS — R198 Other specified symptoms and signs involving the digestive system and abdomen: Secondary | ICD-10-CM

## 2021-11-06 ENCOUNTER — Other Ambulatory Visit (HOSPITAL_COMMUNITY): Payer: Self-pay

## 2021-11-07 ENCOUNTER — Other Ambulatory Visit: Payer: Self-pay | Admitting: Family Medicine

## 2021-11-07 DIAGNOSIS — F418 Other specified anxiety disorders: Secondary | ICD-10-CM

## 2021-11-07 DIAGNOSIS — R269 Unspecified abnormalities of gait and mobility: Secondary | ICD-10-CM | POA: Diagnosis not present

## 2021-11-07 DIAGNOSIS — G4733 Obstructive sleep apnea (adult) (pediatric): Secondary | ICD-10-CM | POA: Diagnosis not present

## 2021-11-07 NOTE — Telephone Encounter (Signed)
Patient called in requesting a refill for LORazepam (ATIVAN) 1 MG tablet [[id number]]  to be sent to his pharmacy.  Please advise.

## 2021-11-10 MED ORDER — LORAZEPAM 1 MG PO TABS: 0.5000 mg | ORAL_TABLET | Freq: Two times a day (BID) | ORAL | 3 refills | Status: DC | PRN

## 2021-11-12 DIAGNOSIS — R269 Unspecified abnormalities of gait and mobility: Secondary | ICD-10-CM | POA: Diagnosis not present

## 2021-11-12 DIAGNOSIS — G4733 Obstructive sleep apnea (adult) (pediatric): Secondary | ICD-10-CM | POA: Diagnosis not present

## 2021-11-21 ENCOUNTER — Other Ambulatory Visit (HOSPITAL_COMMUNITY): Payer: Self-pay

## 2021-11-24 ENCOUNTER — Telehealth: Payer: Medicare Other

## 2021-11-27 ENCOUNTER — Other Ambulatory Visit (HOSPITAL_COMMUNITY): Payer: Self-pay

## 2021-12-05 ENCOUNTER — Other Ambulatory Visit: Payer: Self-pay | Admitting: Family Medicine

## 2021-12-05 ENCOUNTER — Other Ambulatory Visit: Payer: Self-pay | Admitting: Cardiovascular Disease

## 2021-12-05 DIAGNOSIS — F418 Other specified anxiety disorders: Secondary | ICD-10-CM

## 2021-12-05 DIAGNOSIS — G4733 Obstructive sleep apnea (adult) (pediatric): Secondary | ICD-10-CM | POA: Diagnosis not present

## 2021-12-05 DIAGNOSIS — R269 Unspecified abnormalities of gait and mobility: Secondary | ICD-10-CM | POA: Diagnosis not present

## 2021-12-09 ENCOUNTER — Other Ambulatory Visit: Payer: Self-pay | Admitting: *Deleted

## 2021-12-09 MED ORDER — PREDNISONE 5 MG PO TABS: 5.0000 mg | ORAL_TABLET | Freq: Every day | ORAL | 3 refills | Status: DC

## 2021-12-13 DIAGNOSIS — G4733 Obstructive sleep apnea (adult) (pediatric): Secondary | ICD-10-CM | POA: Diagnosis not present

## 2021-12-13 DIAGNOSIS — R269 Unspecified abnormalities of gait and mobility: Secondary | ICD-10-CM | POA: Diagnosis not present

## 2021-12-15 NOTE — Progress Notes (Signed)
? ?HPI: ?[name] is a 81 y.o. 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 ? ?Last seen on 10/27/21. He has not had EGD done and has not seen surgeon. States that he has been cleared for procedures by his cardiologist. ?Abdominal/pelvic CT: No acute abnormality. No biliary ductal dilatation. Suggestion of cholelithiasis, with no evidence of acute cholecystitis. ?Cholecystectomy was recommended. ? ?Jaundice has improved. ?He is not having abdominal pain,nausea,or vomiting. ?No changes in color of stools or urine. ?Lab Results  ?Component Value Date  ? ALT 47 (H) 10/03/2021  ? AST 25 10/03/2021  ? ALKPHOS 133 (H) 10/03/2021  ? BILITOT 1.1 10/03/2021  ? ?Hypertension:  ?Medications: Metoprolol Tartrate 25 mg bid. ?BP readings at home:Not checking. ?Side effects:None. ?Negative for unusual or severe headache, visual changes, exertional chest pain, dyspnea,  focal weakness, or worsening edema. ? ?Lab Results  ?Component Value Date  ? CREATININE 1.12 10/03/2021  ? BUN 18 10/03/2021  ? NA 137 10/03/2021  ? K 4.2 10/03/2021  ? CL 102 10/03/2021  ? CO2 23 10/03/2021  ? ?COPD,OSA on CPAP,and hx of chronic respiratory failure following with pulmonologist, last seen on 08/18/21. ? ?Sick sinus synd s/p dual chamber pacemaker placement, following with cardiologist. ? ?Review of Systems  ?Constitutional:  Positive for fatigue. Negative for activity change, appetite change and fever.  ?HENT:  Negative for nosebleeds and sore throat.   ?Respiratory:  Positive for cough. Negative for wheezing.   ?Genitourinary:  Negative for decreased urine volume, dysuria and hematuria.  ?Musculoskeletal:  Positive for arthralgias and gait problem.  ?Neurological:  Negative for syncope and facial asymmetry.  ?Rest of ROS see pertinent positives and negatives in HPI. ? ?Current Outpatient  Medications on File Prior to Visit  ?Medication Sig Dispense Refill  ? abiraterone acetate (ZYTIGA) 250 MG tablet TAKE 4 TABLETS (1,000 MG TOTAL) BY MOUTH DAILY. TAKE ON AN EMPTY STOMACH 1 HOUR BEFORE OR 2 HOURS AFTER A MEAL 120 tablet 2  ? acetaminophen (TYLENOL) 500 MG tablet Take 1,000 mg by mouth every 6 (six) hours as needed for headache or moderate pain (pain).    ? albuterol (VENTOLIN HFA) 108 (90 Base) MCG/ACT inhaler TAKE 2 PUFFS BY MOUTH EVERY 6 HOURS AS NEEDED FOR WHEEZE OR SHORTNESS OF BREATH (Patient taking differently: Inhale 2 puffs into the lungs every 6 (six) hours as needed for wheezing or shortness of breath.) 18 each 12  ? Ascorbic Acid (VITAMIN C PO) Take 1 tablet by mouth daily.    ? aspirin EC 81 MG tablet Take 81 mg by mouth daily.    ? atorvastatin (LIPITOR) 20 MG tablet TAKE 1 TABLET BY MOUTH EVERY DAY 90 tablet 0  ? budesonide-formoterol (SYMBICORT) 160-4.5 MCG/ACT inhaler Inhale 2 puffs then rinse mouth, twice daily (Patient taking differently: Inhale 2 puffs into the lungs 2 (two) times daily. Inhale 2 puffs then rinse mouth, twice daily) 1 each 12  ? carbamide peroxide (DEBROX) 6.5 % OTIC solution Place 5 drops into both ears daily as needed (for earwax).    ? citalopram (CELEXA) 10 MG tablet TAKE 1 TABLET BY MOUTH EVERY DAY 90 tablet 3  ? diclofenac Sodium (VOLTAREN) 1 % GEL AAPLY 4 GRAMS TOPICALLY 4 TIMES DAILY (Patient taking differently: Apply 2 g topically daily as needed (pain).) 400 g 2  ? docusate sodium (COLACE) 100 MG capsule Take 100 mg by mouth  daily with supper.    ? fluticasone (FLONASE) 50 MCG/ACT nasal spray PLACE 1 SPRAY INTO BOTH NOSTRILS DAILY AS NEEDED FOR ALLERGIES OR RHINITIS (AT BEDTIME). (Patient taking differently: Place 1 spray into both nostrils at bedtime as needed for allergies or rhinitis.) 48 mL 1  ? furosemide (LASIX) 20 MG tablet TAKE 1 TABLET BY MOUITH DAILY FOR 5 DAYS , THEN DAILY AS NEEDED FOR EDEMA (Patient taking differently: Take 20 mg by mouth  daily.) 90 tablet 1  ? LORazepam (ATIVAN) 1 MG tablet Take 0.5-1 tablets (0.5-1 mg total) by mouth 2 (two) times daily as needed for anxiety. 60 tablet 3  ? metoprolol tartrate (LOPRESSOR) 25 MG tablet TAKE 1 TABLET BY MOUTH TWICE A DAY 180 tablet 2  ? Multiple Vitamin (MULTIVITAMIN WITH MINERALS) TABS tablet Take 0.5 tablets by mouth daily. Centrum    ? omeprazole (PRILOSEC) 20 MG capsule TAKE 1 CAPSULE (20 MG TOTAL) BY MOUTH DAILY BEFORE BREAKFAST. 90 capsule 3  ? predniSONE (DELTASONE) 5 MG tablet Take 1 tablet (5 mg total) by mouth daily with breakfast. 90 tablet 3  ? Tetrahydrozoline HCl (VISINE OP) Place 1 drop into both eyes daily as needed (red eyes).    ? ?No current facility-administered medications on file prior to visit.  ? ? ?Past Medical History:  ?Diagnosis Date  ? COPD (chronic obstructive pulmonary disease) ([place])   ? Gastric outlet obstruction 02/03/2018  ? [name] 02/03/2018  ? GERD (gastroesophageal reflux disease)   ? History of hiatal hernia   ? small/notes 02/03/2018  ? Hypertension   ? no meds  ? Presence of permanent cardiac pacemaker   ? Prostate cancer ([place]) 02/08/2014  ? Gleason 4+5=9, PSA 15.65  ? Radiation   ? Sinus problem   ? SSS (sick sinus syndrome) ([place])   ? ?Allergies  ?Allergen Reactions  ? Penicillins Rash  ?  Has patient had a PCN reaction causing immediate rash, facial/tongue/throat swelling, SOB or lightheadedness with hypotension: Yes ?Has patient had a PCN reaction causing severe rash involving mucus membranes or skin necrosis: No ?Has patient had a PCN reaction that required hospitalization: No ?Has patient had a PCN reaction occurring within the last 10 years: No ?If all of the above answers are "NO", then may proceed with Cephalosporin use.  ? ? ?Social History  ? ?Socioeconomic History  ? Marital status: Married  ?  Spouse name: Not on file  ? Number of children: 3  ? Years of education: Not on file  ? Highest education level: Not on file  ?Occupational History  ?  Occupation: Freight forwarder  ?  Employer: [employer]  ?  Comment: retired  ? Occupation: Freight forwarder  ?  Comment: [employer]-[place]  ?Tobacco Use  ? Smoking status: Former  ?  Types: Cigars  ?  Quit date: 09/14/2012  ?  Years since quitting: 9.2  ? Smokeless tobacco: Never  ? Tobacco comments:  ?  little cigars; the small ones"  ?Vaping Use  ? Vaping Use: Never used  ?Substance and Sexual Activity  ? Alcohol use: Yes  ?  Alcohol/week: 14.0 standard drinks  ?  Types: 14 Cans of beer per week  ? Drug use: No  ? Sexual activity: Not Currently  ?Other Topics Concern  ? Not on file  ?Social History Narrative  ? Lives with wife and 2 dogs in one level home; daughter and her family co-habitate.  ? Has three daughters, all in [place], supportive. Five grandchildren.  ? Wants  to return to doing silver sneakers once gym re-opens.  ?   ?   ? ?Social Determinants of Health  ? ?Financial Resource Strain: Low Risk   ? Difficulty of Paying Living Expenses: Not hard at all  ?Food Insecurity: No Food Insecurity  ? Worried About Charity fundraiser in the Last Year: Never true  ? Ran Out of Food in the Last Year: Never true  ?Transportation Needs: No Transportation Needs  ? Lack of Transportation (Medical): No  ? Lack of Transportation (Non-Medical): No  ?Physical Activity: Insufficiently Active  ? Days of Exercise per Week: 5 days  ? Minutes of Exercise per Session: 20 min  ?Stress: No Stress Concern Present  ? Feeling of Stress : Not at all  ?Social Connections: Moderately Integrated  ? Frequency of Communication with Friends and Family: More than three times a week  ? Frequency of Social Gatherings with Friends and Family: More than three times a week  ? Attends Religious Services: More than 4 times per year  ? Active Member of Clubs or Organizations: No  ? Attends Archivist Meetings: Not on file  ? Marital Status: Married  ? ?Vitals:  ? 12/16/21 0917  ?BP: 120/70  ?Pulse: 87  ?Resp: 16  ?SpO2: 92%  ? ?Wt Readings from Last 3  Encounters:  ?12/16/21 223 lb 6 oz (101.3 kg)  ?10/27/21 227 lb (103 kg)  ?10/03/21 219 lb 1.6 oz (99.4 kg)  ? ?Body mass index is 33.96 kg/m?. ? ?Physical Exam ?Vitals and nursing note reviewed.  ?Constitutional:   ?   Gen

## 2021-12-16 ENCOUNTER — Encounter: Payer: Self-pay | Admitting: Family Medicine

## 2021-12-16 ENCOUNTER — Ambulatory Visit (INDEPENDENT_AMBULATORY_CARE_PROVIDER_SITE_OTHER): Payer: Medicare Other

## 2021-12-16 ENCOUNTER — Ambulatory Visit (INDEPENDENT_AMBULATORY_CARE_PROVIDER_SITE_OTHER): Payer: Medicare Other | Admitting: Family Medicine

## 2021-12-16 VITALS — BP 120/70 | HR 87 | Resp 16 | Ht 68.0 in | Wt 223.4 lb

## 2021-12-16 DIAGNOSIS — R233 Spontaneous ecchymoses: Secondary | ICD-10-CM

## 2021-12-16 DIAGNOSIS — F418 Other specified anxiety disorders: Secondary | ICD-10-CM | POA: Diagnosis not present

## 2021-12-16 DIAGNOSIS — E7800 Pure hypercholesterolemia, unspecified: Secondary | ICD-10-CM

## 2021-12-16 DIAGNOSIS — J9611 Chronic respiratory failure with hypoxia: Secondary | ICD-10-CM | POA: Insufficient documentation

## 2021-12-16 DIAGNOSIS — R17 Unspecified jaundice: Secondary | ICD-10-CM | POA: Diagnosis not present

## 2021-12-16 DIAGNOSIS — I495 Sick sinus syndrome: Secondary | ICD-10-CM | POA: Diagnosis not present

## 2021-12-16 DIAGNOSIS — I10 Essential (primary) hypertension: Secondary | ICD-10-CM

## 2021-12-16 LAB — CUP PACEART REMOTE DEVICE CHECK
Battery Remaining Longevity: 124 mo
Battery Voltage: 3.01 V
Brady Statistic AP VP Percent: 0.06 %
Brady Statistic AP VS Percent: 84.41 %
Brady Statistic AS VP Percent: 0.02 %
Brady Statistic AS VS Percent: 15.51 %
Brady Statistic RA Percent Paced: 84.15 %
Brady Statistic RV Percent Paced: 0.08 %
Date Time Interrogation Session: 20230404062732
Implantable Lead Implant Date: 20191001
Implantable Lead Implant Date: 20191001
Implantable Lead Location: 753859
Implantable Lead Location: 753860
Implantable Lead Model: 5076
Implantable Lead Model: 5076
Implantable Pulse Generator Implant Date: 20191001
Lead Channel Impedance Value: 304 Ohm
Lead Channel Impedance Value: 323 Ohm
Lead Channel Impedance Value: 380 Ohm
Lead Channel Impedance Value: 437 Ohm
Lead Channel Pacing Threshold Amplitude: 0.5 V
Lead Channel Pacing Threshold Amplitude: 0.625 V
Lead Channel Pacing Threshold Pulse Width: 0.4 ms
Lead Channel Pacing Threshold Pulse Width: 0.4 ms
Lead Channel Sensing Intrinsic Amplitude: 2.625 mV
Lead Channel Sensing Intrinsic Amplitude: 2.625 mV
Lead Channel Sensing Intrinsic Amplitude: 2.75 mV
Lead Channel Sensing Intrinsic Amplitude: 2.75 mV
Lead Channel Setting Pacing Amplitude: 1.5 V
Lead Channel Setting Pacing Amplitude: 2.5 V
Lead Channel Setting Pacing Pulse Width: 0.4 ms
Lead Channel Setting Sensing Sensitivity: 0.9 mV

## 2021-12-16 NOTE — Assessment & Plan Note (Signed)
BP adequately controlled. ?Continue Metoprolol tartrate 25 mg bid and low salt diet. ?BMP is being checked regularly by his hematologist. ?

## 2021-12-16 NOTE — Assessment & Plan Note (Signed)
Educated about Dx. ?Aspirin may aggravate problem. ?

## 2021-12-16 NOTE — Assessment & Plan Note (Signed)
Problem is stable. ?Continue Lorazepam 1 mg bid prn and Celexa 10 mg daily. ?Some side effects discussed. ?

## 2021-12-16 NOTE — Patient Instructions (Addendum)
A few things to remember from today's visit: ? ?Essential hypertension, benign ? ?Other specified anxiety disorders ? ?Senile ecchymosis ? ?If you need refills please call your pharmacy. ?Do not use My Chart to request refills or for acute issues that need immediate attention. ?  ?Call gastroenterologist to inquire about GI procedure and/or appt with surgeon for cholecystectomy. ?No changes today. ? ?Please be sure medication list is accurate. ?If a new problem present, please set up appointment sooner than planned today. ? ? ? ? ? ? ? ?

## 2021-12-19 ENCOUNTER — Other Ambulatory Visit (HOSPITAL_COMMUNITY): Payer: Self-pay

## 2021-12-19 ENCOUNTER — Other Ambulatory Visit: Payer: Self-pay | Admitting: Oncology

## 2021-12-19 ENCOUNTER — Ambulatory Visit: Payer: Medicare Other | Admitting: Family Medicine

## 2021-12-19 DIAGNOSIS — C61 Malignant neoplasm of prostate: Secondary | ICD-10-CM

## 2021-12-19 MED ORDER — ABIRATERONE ACETATE 250 MG PO TABS
ORAL_TABLET | Freq: Every day | ORAL | 2 refills | Status: DC
  Filled 2021-12-23: qty 120, 30d supply, fill #0
  Filled 2022-01-16: qty 120, 30d supply, fill #1
  Filled 2022-03-10: qty 120, 30d supply, fill #2

## 2021-12-23 ENCOUNTER — Other Ambulatory Visit: Payer: Self-pay | Admitting: *Deleted

## 2021-12-23 ENCOUNTER — Other Ambulatory Visit (HOSPITAL_COMMUNITY): Payer: Self-pay

## 2021-12-23 DIAGNOSIS — I714 Abdominal aortic aneurysm, without rupture, unspecified: Secondary | ICD-10-CM

## 2021-12-23 DIAGNOSIS — I7121 Aneurysm of the ascending aorta, without rupture: Secondary | ICD-10-CM

## 2021-12-24 ENCOUNTER — Telehealth: Payer: Self-pay | Admitting: Oncology

## 2021-12-24 ENCOUNTER — Inpatient Hospital Stay (HOSPITAL_BASED_OUTPATIENT_CLINIC_OR_DEPARTMENT_OTHER)
Admission: EM | Admit: 2021-12-24 | Discharge: 2022-01-01 | DRG: 871 | Disposition: A | Discharge status: Home Health | Payer: Medicare Other | Attending: Internal Medicine | Admitting: Internal Medicine

## 2021-12-24 ENCOUNTER — Inpatient Hospital Stay (HOSPITAL_COMMUNITY): Payer: Medicare Other

## 2021-12-24 ENCOUNTER — Emergency Department (HOSPITAL_BASED_OUTPATIENT_CLINIC_OR_DEPARTMENT_OTHER): Payer: Medicare Other

## 2021-12-24 ENCOUNTER — Encounter (HOSPITAL_BASED_OUTPATIENT_CLINIC_OR_DEPARTMENT_OTHER): Payer: Self-pay

## 2021-12-24 ENCOUNTER — Other Ambulatory Visit: Payer: Self-pay

## 2021-12-24 DIAGNOSIS — I7143 Infrarenal abdominal aortic aneurysm, without rupture: Secondary | ICD-10-CM | POA: Diagnosis not present

## 2021-12-24 DIAGNOSIS — E7800 Pure hypercholesterolemia, unspecified: Secondary | ICD-10-CM | POA: Diagnosis present

## 2021-12-24 DIAGNOSIS — J439 Emphysema, unspecified: Secondary | ICD-10-CM | POA: Diagnosis present

## 2021-12-24 DIAGNOSIS — B9620 Unspecified Escherichia coli [E. coli] as the cause of diseases classified elsewhere: Secondary | ICD-10-CM | POA: Diagnosis not present

## 2021-12-24 DIAGNOSIS — I7121 Aneurysm of the ascending aorta, without rupture: Secondary | ICD-10-CM

## 2021-12-24 DIAGNOSIS — I495 Sick sinus syndrome: Secondary | ICD-10-CM | POA: Diagnosis present

## 2021-12-24 DIAGNOSIS — I959 Hypotension, unspecified: Secondary | ICD-10-CM | POA: Diagnosis not present

## 2021-12-24 DIAGNOSIS — R7303 Prediabetes: Secondary | ICD-10-CM | POA: Diagnosis not present

## 2021-12-24 DIAGNOSIS — J9601 Acute respiratory failure with hypoxia: Secondary | ICD-10-CM | POA: Diagnosis not present

## 2021-12-24 DIAGNOSIS — Z781 Physical restraint status: Secondary | ICD-10-CM

## 2021-12-24 DIAGNOSIS — C61 Malignant neoplasm of prostate: Secondary | ICD-10-CM | POA: Diagnosis not present

## 2021-12-24 DIAGNOSIS — F419 Anxiety disorder, unspecified: Secondary | ICD-10-CM | POA: Diagnosis present

## 2021-12-24 DIAGNOSIS — K8010 Calculus of gallbladder with chronic cholecystitis without obstruction: Secondary | ICD-10-CM | POA: Diagnosis not present

## 2021-12-24 DIAGNOSIS — K219 Gastro-esophageal reflux disease without esophagitis: Secondary | ICD-10-CM | POA: Diagnosis present

## 2021-12-24 DIAGNOSIS — N281 Cyst of kidney, acquired: Secondary | ICD-10-CM | POA: Diagnosis not present

## 2021-12-24 DIAGNOSIS — R7989 Other specified abnormal findings of blood chemistry: Secondary | ICD-10-CM | POA: Diagnosis present

## 2021-12-24 DIAGNOSIS — I714 Abdominal aortic aneurysm, without rupture, unspecified: Secondary | ICD-10-CM

## 2021-12-24 DIAGNOSIS — A419 Sepsis, unspecified organism: Secondary | ICD-10-CM | POA: Diagnosis not present

## 2021-12-24 DIAGNOSIS — Z9049 Acquired absence of other specified parts of digestive tract: Secondary | ICD-10-CM

## 2021-12-24 DIAGNOSIS — Z79899 Other long term (current) drug therapy: Secondary | ICD-10-CM

## 2021-12-24 DIAGNOSIS — R0902 Hypoxemia: Secondary | ICD-10-CM | POA: Diagnosis not present

## 2021-12-24 DIAGNOSIS — J449 Chronic obstructive pulmonary disease, unspecified: Secondary | ICD-10-CM | POA: Diagnosis not present

## 2021-12-24 DIAGNOSIS — J9611 Chronic respiratory failure with hypoxia: Secondary | ICD-10-CM | POA: Diagnosis not present

## 2021-12-24 DIAGNOSIS — A4151 Sepsis due to Escherichia coli [E. coli]: Secondary | ICD-10-CM | POA: Diagnosis present

## 2021-12-24 DIAGNOSIS — A4189 Other specified sepsis: Secondary | ICD-10-CM | POA: Diagnosis not present

## 2021-12-24 DIAGNOSIS — J9691 Respiratory failure, unspecified with hypoxia: Secondary | ICD-10-CM | POA: Diagnosis present

## 2021-12-24 DIAGNOSIS — K839 Disease of biliary tract, unspecified: Secondary | ICD-10-CM | POA: Diagnosis present

## 2021-12-24 DIAGNOSIS — J9621 Acute and chronic respiratory failure with hypoxia: Secondary | ICD-10-CM | POA: Diagnosis present

## 2021-12-24 DIAGNOSIS — Z8546 Personal history of malignant neoplasm of prostate: Secondary | ICD-10-CM

## 2021-12-24 DIAGNOSIS — J1282 Pneumonia due to coronavirus disease 2019: Secondary | ICD-10-CM | POA: Diagnosis not present

## 2021-12-24 DIAGNOSIS — R7402 Elevation of levels of lactic acid dehydrogenase (LDH): Secondary | ICD-10-CM | POA: Diagnosis present

## 2021-12-24 DIAGNOSIS — R739 Hyperglycemia, unspecified: Secondary | ICD-10-CM | POA: Diagnosis not present

## 2021-12-24 DIAGNOSIS — K8050 Calculus of bile duct without cholangitis or cholecystitis without obstruction: Secondary | ICD-10-CM | POA: Diagnosis not present

## 2021-12-24 DIAGNOSIS — G4733 Obstructive sleep apnea (adult) (pediatric): Secondary | ICD-10-CM | POA: Diagnosis not present

## 2021-12-24 DIAGNOSIS — R6520 Severe sepsis without septic shock: Secondary | ICD-10-CM | POA: Diagnosis present

## 2021-12-24 DIAGNOSIS — Z20822 Contact with and (suspected) exposure to covid-19: Secondary | ICD-10-CM | POA: Diagnosis present

## 2021-12-24 DIAGNOSIS — I2510 Atherosclerotic heart disease of native coronary artery without angina pectoris: Secondary | ICD-10-CM | POA: Diagnosis present

## 2021-12-24 DIAGNOSIS — E669 Obesity, unspecified: Secondary | ICD-10-CM | POA: Diagnosis not present

## 2021-12-24 DIAGNOSIS — F05 Delirium due to known physiological condition: Secondary | ICD-10-CM | POA: Diagnosis not present

## 2021-12-24 DIAGNOSIS — I700 Atherosclerosis of aorta: Secondary | ICD-10-CM | POA: Diagnosis not present

## 2021-12-24 DIAGNOSIS — I453 Trifascicular block: Secondary | ICD-10-CM | POA: Diagnosis present

## 2021-12-24 DIAGNOSIS — Z923 Personal history of irradiation: Secondary | ICD-10-CM

## 2021-12-24 DIAGNOSIS — K8020 Calculus of gallbladder without cholecystitis without obstruction: Secondary | ICD-10-CM | POA: Diagnosis not present

## 2021-12-24 DIAGNOSIS — Z950 Presence of cardiac pacemaker: Secondary | ICD-10-CM | POA: Diagnosis not present

## 2021-12-24 DIAGNOSIS — Z880 Allergy status to penicillin: Secondary | ICD-10-CM

## 2021-12-24 DIAGNOSIS — Z6835 Body mass index (BMI) 35.0-35.9, adult: Secondary | ICD-10-CM

## 2021-12-24 DIAGNOSIS — U071 COVID-19: Principal | ICD-10-CM

## 2021-12-24 DIAGNOSIS — R7881 Bacteremia: Secondary | ICD-10-CM | POA: Diagnosis not present

## 2021-12-24 DIAGNOSIS — F064 Anxiety disorder due to known physiological condition: Secondary | ICD-10-CM | POA: Diagnosis not present

## 2021-12-24 DIAGNOSIS — R0602 Shortness of breath: Secondary | ICD-10-CM | POA: Diagnosis not present

## 2021-12-24 DIAGNOSIS — I712 Thoracic aortic aneurysm, without rupture, unspecified: Secondary | ICD-10-CM | POA: Diagnosis not present

## 2021-12-24 DIAGNOSIS — I10 Essential (primary) hypertension: Secondary | ICD-10-CM | POA: Diagnosis not present

## 2021-12-24 DIAGNOSIS — R531 Weakness: Secondary | ICD-10-CM | POA: Diagnosis not present

## 2021-12-24 DIAGNOSIS — Z7951 Long term (current) use of inhaled steroids: Secondary | ICD-10-CM

## 2021-12-24 DIAGNOSIS — Z820 Family history of epilepsy and other diseases of the nervous system: Secondary | ICD-10-CM

## 2021-12-24 DIAGNOSIS — R7401 Elevation of levels of liver transaminase levels: Secondary | ICD-10-CM | POA: Diagnosis present

## 2021-12-24 DIAGNOSIS — R5382 Chronic fatigue, unspecified: Secondary | ICD-10-CM | POA: Diagnosis present

## 2021-12-24 DIAGNOSIS — I517 Cardiomegaly: Secondary | ICD-10-CM | POA: Diagnosis not present

## 2021-12-24 DIAGNOSIS — Z7982 Long term (current) use of aspirin: Secondary | ICD-10-CM

## 2021-12-24 DIAGNOSIS — Z87891 Personal history of nicotine dependence: Secondary | ICD-10-CM

## 2021-12-24 DIAGNOSIS — F17200 Nicotine dependence, unspecified, uncomplicated: Secondary | ICD-10-CM | POA: Diagnosis present

## 2021-12-24 DIAGNOSIS — Z8261 Family history of arthritis: Secondary | ICD-10-CM

## 2021-12-24 DIAGNOSIS — J811 Chronic pulmonary edema: Secondary | ICD-10-CM | POA: Diagnosis not present

## 2021-12-24 LAB — COMPREHENSIVE METABOLIC PANEL
ALT: 643 U/L — ABNORMAL HIGH (ref 0–44)
AST: 1288 U/L — ABNORMAL HIGH (ref 15–41)
Albumin: 3.7 g/dL (ref 3.5–5.0)
Alkaline Phosphatase: 98 U/L (ref 38–126)
Anion gap: 12 (ref 5–15)
BUN: 25 mg/dL — ABNORMAL HIGH (ref 8–23)
CO2: 25 mmol/L (ref 22–32)
Calcium: 8.5 mg/dL — ABNORMAL LOW (ref 8.9–10.3)
Chloride: 102 mmol/L (ref 98–111)
Creatinine, Ser: 1.14 mg/dL (ref 0.61–1.24)
GFR, Estimated: >60 mL/min (ref >60)
Glucose, Bld: 132 mg/dL — ABNORMAL HIGH (ref 70–99)
Potassium: 3.6 mmol/L (ref 3.5–5.1)
Sodium: 139 mmol/L (ref 135–145)
Total Bilirubin: 2.8 mg/dL — ABNORMAL HIGH (ref 0.3–1.2)
Total Protein: 5.9 g/dL — ABNORMAL LOW (ref 6.5–8.1)

## 2021-12-24 LAB — I-STAT ARTERIAL BLOOD GAS, ED
Acid-base deficit: 1 mmol/L (ref 0.0–2.0)
Bicarbonate: 23.8 mmol/L (ref 20.0–28.0)
Calcium, Ion: 1.16 mmol/L (ref 1.15–1.40)
HCT: 34 % — ABNORMAL LOW (ref 39.0–52.0)
Hemoglobin: 11.6 g/dL — ABNORMAL LOW (ref 13.0–17.0)
O2 Saturation: 92 %
Patient temperature: 97.8
Potassium: 3.5 mmol/L (ref 3.5–5.1)
Sodium: 139 mmol/L (ref 135–145)
TCO2: 25 mmol/L (ref 22–32)
pCO2 arterial: 37.3 mmHg (ref 32–48)
pH, Arterial: 7.412 (ref 7.35–7.45)
pO2, Arterial: 61 mmHg — ABNORMAL LOW (ref 83–108)

## 2021-12-24 LAB — CBC WITH DIFFERENTIAL/PLATELET
Abs Immature Granulocytes: 0.03 10*3/uL (ref 0.00–0.07)
Basophils Absolute: 0 10*3/uL (ref 0.0–0.1)
Basophils Relative: 0 %
Eosinophils Absolute: 0.1 10*3/uL (ref 0.0–0.5)
Eosinophils Relative: 1 %
HCT: 38.1 % — ABNORMAL LOW (ref 39.0–52.0)
Hemoglobin: 12.5 g/dL — ABNORMAL LOW (ref 13.0–17.0)
Immature Granulocytes: 1 %
Lymphocytes Relative: 6 %
Lymphs Abs: 0.3 10*3/uL — ABNORMAL LOW (ref 0.7–4.0)
MCH: 30.9 pg (ref 26.0–34.0)
MCHC: 32.8 g/dL (ref 30.0–36.0)
MCV: 94.1 fL (ref 80.0–100.0)
Monocytes Absolute: 0.3 10*3/uL (ref 0.1–1.0)
Monocytes Relative: 7 %
Neutro Abs: 4 10*3/uL (ref 1.7–7.7)
Neutrophils Relative %: 85 %
Platelets: 156 10*3/uL (ref 150–400)
RBC: 4.05 MIL/uL — ABNORMAL LOW (ref 4.22–5.81)
RDW: 14.3 % (ref 11.5–15.5)
WBC: 4.7 10*3/uL (ref 4.0–10.5)
nRBC: 0 % (ref 0.0–0.2)

## 2021-12-24 LAB — LACTIC ACID, PLASMA
Lactic Acid, Venous: 3.6 mmol/L (ref 0.5–1.9)
Lactic Acid, Venous: 6.5 mmol/L (ref 0.5–1.9)
Lactic Acid, Venous: 2.2 mmol/L (ref 0.5–1.9)

## 2021-12-24 LAB — RESP PANEL BY RT-PCR (FLU A&B, COVID) ARPGX2
Influenza A by PCR: NEGATIVE
Influenza B by PCR: NEGATIVE
SARS Coronavirus 2 by RT PCR: POSITIVE — AB

## 2021-12-24 LAB — PROCALCITONIN: Procalcitonin: 20.58 ng/mL

## 2021-12-24 LAB — PROTIME-INR
INR: 1.1 (ref 0.8–1.2)
Prothrombin Time: 14.3 seconds (ref 11.4–15.2)

## 2021-12-24 LAB — TROPONIN I (HIGH SENSITIVITY)
Troponin I (High Sensitivity): 42 ng/L — ABNORMAL HIGH (ref <18)
Troponin I (High Sensitivity): 44 ng/L — ABNORMAL HIGH (ref <18)

## 2021-12-24 LAB — APTT: aPTT: 26 seconds (ref 24–36)

## 2021-12-24 LAB — HEPATITIS PANEL, ACUTE
HCV Ab: NONREACTIVE
Hep A IgM: NONREACTIVE
Hep B C IgM: NONREACTIVE
Hepatitis B Surface Ag: NONREACTIVE

## 2021-12-24 LAB — GLUCOSE, CAPILLARY
Glucose-Capillary: 114 mg/dL — ABNORMAL HIGH (ref 70–99)
Glucose-Capillary: 128 mg/dL — ABNORMAL HIGH (ref 70–99)

## 2021-12-24 LAB — CK: Total CK: 73 U/L (ref 49–397)

## 2021-12-24 MED ORDER — NOREPINEPHRINE 4 MG/250ML-% IV SOLN
0.0000 ug/min | INTRAVENOUS | Status: DC
  Filled 2021-12-24 (×2): qty 250

## 2021-12-24 MED ORDER — MOMETASONE FURO-FORMOTEROL FUM 200-5 MCG/ACT IN AERO
2.0000 | INHALATION_SPRAY | Freq: Two times a day (BID) | RESPIRATORY_TRACT | Status: DC
Start: 2021-12-24 — End: 2022-01-01
  Administered 2021-12-24 – 2022-01-01 (×16): 2 via RESPIRATORY_TRACT
  Filled 2021-12-24: qty 8.8

## 2021-12-24 MED ORDER — LACTATED RINGERS IV BOLUS (SEPSIS)
1000.0000 mL | Freq: Once | INTRAVENOUS | Status: AC
  Administered 2021-12-24: 1000 mL via INTRAVENOUS

## 2021-12-24 MED ORDER — ENOXAPARIN SODIUM 40 MG/0.4ML IJ SOSY
40.0000 mg | PREFILLED_SYRINGE | INTRAMUSCULAR | Status: DC
  Administered 2021-12-25 – 2021-12-31 (×7): 40 mg via SUBCUTANEOUS
  Filled 2021-12-24 (×7): qty 0.4

## 2021-12-24 MED ORDER — ASPIRIN EC 81 MG PO TBEC
81.0000 mg | DELAYED_RELEASE_TABLET | Freq: Every day | ORAL | Status: DC
  Administered 2021-12-24 – 2022-01-01 (×9): 81 mg via ORAL
  Filled 2021-12-24 (×9): qty 1

## 2021-12-24 MED ORDER — METHYLPREDNISOLONE SODIUM SUCC 125 MG IJ SOLR
0.5000 mg/kg | Freq: Two times a day (BID) | INTRAMUSCULAR | Status: DC
  Administered 2021-12-24 – 2021-12-25 (×2): 50.625 mg via INTRAVENOUS
  Filled 2021-12-24 (×2): qty 2

## 2021-12-24 MED ORDER — DOCUSATE SODIUM 100 MG PO CAPS
100.0000 mg | ORAL_CAPSULE | Freq: Every day | ORAL | Status: DC
Start: 2021-12-24 — End: 2022-01-01
  Administered 2021-12-24 – 2021-12-28 (×5): 100 mg via ORAL
  Filled 2021-12-24 (×6): qty 1

## 2021-12-24 MED ORDER — CITALOPRAM HYDROBROMIDE 20 MG PO TABS
10.0000 mg | ORAL_TABLET | Freq: Every day | ORAL | Status: DC
  Administered 2021-12-24 – 2022-01-01 (×9): 10 mg via ORAL
  Filled 2021-12-24 (×11): qty 1

## 2021-12-24 MED ORDER — VANCOMYCIN HCL 1250 MG/250ML IV SOLN
1250.0000 mg | INTRAVENOUS | Status: DC
Start: 2021-12-25 — End: 2021-12-25
  Filled 2021-12-24: qty 250

## 2021-12-24 MED ORDER — ALBUTEROL SULFATE HFA 108 (90 BASE) MCG/ACT IN AERS
2.0000 | INHALATION_SPRAY | Freq: Four times a day (QID) | RESPIRATORY_TRACT | Status: DC
  Filled 2021-12-24: qty 6.7

## 2021-12-24 MED ORDER — SODIUM CHLORIDE 0.9 % IV SOLN
2.0000 g | Freq: Once | INTRAVENOUS | Status: AC
  Administered 2021-12-24: 2 g via INTRAVENOUS
  Filled 2021-12-24: qty 12.5

## 2021-12-24 MED ORDER — ADULT MULTIVITAMIN W/MINERALS CH
1.0000 | ORAL_TABLET | Freq: Every day | ORAL | Status: DC
  Administered 2021-12-24 – 2022-01-01 (×9): 1 via ORAL
  Filled 2021-12-24 (×9): qty 1

## 2021-12-24 MED ORDER — VANCOMYCIN HCL IN DEXTROSE 1-5 GM/200ML-% IV SOLN
1000.0000 mg | Freq: Once | INTRAVENOUS | Status: AC
  Administered 2021-12-24: 1000 mg via INTRAVENOUS
  Filled 2021-12-24: qty 200

## 2021-12-24 MED ORDER — ONDANSETRON HCL 4 MG/2ML IJ SOLN
4.0000 mg | Freq: Four times a day (QID) | INTRAMUSCULAR | Status: DC | PRN
Start: 2021-12-24 — End: 2022-01-01

## 2021-12-24 MED ORDER — ACETAMINOPHEN 325 MG PO TABS
650.0000 mg | ORAL_TABLET | Freq: Four times a day (QID) | ORAL | Status: DC | PRN
  Filled 2021-12-24: qty 2

## 2021-12-24 MED ORDER — METRONIDAZOLE 500 MG/100ML IV SOLN
500.0000 mg | Freq: Once | INTRAVENOUS | Status: AC
  Administered 2021-12-24: 500 mg via INTRAVENOUS
  Filled 2021-12-24: qty 100

## 2021-12-24 MED ORDER — PREDNISONE 20 MG PO TABS: 50.0000 mg | ORAL_TABLET | Freq: Every day | ORAL | Status: DC

## 2021-12-24 MED ORDER — IOHEXOL 350 MG/ML SOLN
65.0000 mL | Freq: Once | INTRAVENOUS | Status: AC | PRN
  Administered 2021-12-24: 65 mL via INTRAVENOUS

## 2021-12-24 MED ORDER — LACTATED RINGERS IV SOLN: INTRAVENOUS | Status: DC

## 2021-12-24 MED ORDER — PANTOPRAZOLE SODIUM 40 MG PO TBEC
40.0000 mg | DELAYED_RELEASE_TABLET | Freq: Every day | ORAL | Status: DC
  Administered 2021-12-24 – 2022-01-01 (×9): 40 mg via ORAL
  Filled 2021-12-24 (×9): qty 1

## 2021-12-24 MED ORDER — ENOXAPARIN SODIUM 100 MG/ML IJ SOSY
100.0000 mg | PREFILLED_SYRINGE | Freq: Two times a day (BID) | INTRAMUSCULAR | Status: DC
  Filled 2021-12-24: qty 1

## 2021-12-24 MED ORDER — SODIUM CHLORIDE 0.9% FLUSH
3.0000 mL | Freq: Two times a day (BID) | INTRAVENOUS | Status: DC
  Administered 2021-12-24 – 2021-12-27 (×6): 3 mL via INTRAVENOUS
  Administered 2021-12-28: 10 mL via INTRAVENOUS
  Administered 2021-12-28 – 2022-01-01 (×8): 3 mL via INTRAVENOUS

## 2021-12-24 MED ORDER — BARICITINIB 2 MG PO TABS
2.0000 mg | ORAL_TABLET | Freq: Every day | ORAL | Status: DC
  Administered 2021-12-24 – 2021-12-25 (×2): 2 mg via ORAL
  Filled 2021-12-24 (×2): qty 1

## 2021-12-24 MED ORDER — INSULIN ASPART 100 UNIT/ML IJ SOLN
0.0000 [IU] | INTRAMUSCULAR | Status: DC
  Administered 2021-12-24 – 2021-12-25 (×2): 1 [IU] via SUBCUTANEOUS
  Administered 2021-12-25 (×3): 2 [IU] via SUBCUTANEOUS
  Administered 2021-12-25 – 2021-12-27 (×11): 1 [IU] via SUBCUTANEOUS
  Administered 2021-12-28: 2 [IU] via SUBCUTANEOUS
  Administered 2021-12-29 – 2021-12-31 (×2): 1 [IU] via SUBCUTANEOUS

## 2021-12-24 MED ORDER — GUAIFENESIN-DM 100-10 MG/5ML PO SYRP
10.0000 mL | ORAL_SOLUTION | ORAL | Status: DC | PRN
  Administered 2021-12-24 – 2021-12-28 (×7): 10 mL via ORAL
  Filled 2021-12-24 (×8): qty 10

## 2021-12-24 MED ORDER — HYDROCOD POLI-CHLORPHE POLI ER 10-8 MG/5ML PO SUER
5.0000 mL | Freq: Two times a day (BID) | ORAL | Status: DC | PRN
  Administered 2021-12-24: 5 mL via ORAL
  Filled 2021-12-24 (×2): qty 5

## 2021-12-24 MED ORDER — ALBUTEROL SULFATE HFA 108 (90 BASE) MCG/ACT IN AERS
2.0000 | INHALATION_SPRAY | Freq: Four times a day (QID) | RESPIRATORY_TRACT | Status: DC
  Administered 2021-12-24 – 2021-12-25 (×3): 2 via RESPIRATORY_TRACT

## 2021-12-24 MED ORDER — ZINC SULFATE 220 (50 ZN) MG PO CAPS
220.0000 mg | ORAL_CAPSULE | Freq: Every day | ORAL | Status: DC
  Administered 2021-12-24 – 2022-01-01 (×9): 220 mg via ORAL
  Filled 2021-12-24 (×9): qty 1

## 2021-12-24 MED ORDER — SODIUM CHLORIDE 0.9 % IV SOLN: 2.0000 g | Freq: Once | INTRAVENOUS | Status: DC

## 2021-12-24 MED ORDER — THIAMINE HCL 100 MG PO TABS
100.0000 mg | ORAL_TABLET | Freq: Every day | ORAL | Status: DC
  Administered 2021-12-24 – 2022-01-01 (×9): 100 mg via ORAL
  Filled 2021-12-24 (×9): qty 1

## 2021-12-24 MED ORDER — LORAZEPAM 0.5 MG PO TABS
0.5000 mg | ORAL_TABLET | Freq: Two times a day (BID) | ORAL | Status: DC | PRN
  Administered 2021-12-24: 0.5 mg via ORAL
  Administered 2021-12-27 – 2021-12-29 (×3): 1 mg via ORAL
  Filled 2021-12-24 (×2): qty 2
  Filled 2021-12-24: qty 1
  Filled 2021-12-24: qty 2

## 2021-12-24 MED ORDER — ENOXAPARIN SODIUM 60 MG/0.6ML IJ SOSY
50.0000 mg | PREFILLED_SYRINGE | INTRAMUSCULAR | Status: AC
  Administered 2021-12-24: 50 mg via SUBCUTANEOUS
  Filled 2021-12-24: qty 0.5

## 2021-12-24 MED ORDER — FOLIC ACID 1 MG PO TABS
1.0000 mg | ORAL_TABLET | Freq: Every day | ORAL | Status: DC
  Administered 2021-12-24 – 2022-01-01 (×9): 1 mg via ORAL
  Filled 2021-12-24 (×9): qty 1

## 2021-12-24 MED ORDER — ALBUTEROL SULFATE (2.5 MG/3ML) 0.083% IN NEBU: 2.5000 mg | INHALATION_SOLUTION | Freq: Four times a day (QID) | RESPIRATORY_TRACT | Status: DC

## 2021-12-24 MED ORDER — ALBUTEROL SULFATE HFA 108 (90 BASE) MCG/ACT IN AERS: 2.0000 | INHALATION_SPRAY | Freq: Four times a day (QID) | RESPIRATORY_TRACT | Status: DC

## 2021-12-24 MED ORDER — ASCORBIC ACID 500 MG PO TABS
500.0000 mg | ORAL_TABLET | Freq: Every day | ORAL | Status: DC
  Administered 2021-12-24 – 2022-01-01 (×9): 500 mg via ORAL
  Filled 2021-12-24 (×9): qty 1

## 2021-12-24 MED ORDER — ONDANSETRON HCL 4 MG PO TABS: 4.0000 mg | ORAL_TABLET | Freq: Four times a day (QID) | ORAL | Status: DC | PRN

## 2021-12-24 MED ORDER — SODIUM CHLORIDE 0.9 % IV SOLN
2.0000 g | Freq: Three times a day (TID) | INTRAVENOUS | Status: DC
  Administered 2021-12-24 – 2021-12-25 (×2): 2 g via INTRAVENOUS
  Filled 2021-12-24 (×2): qty 12.5

## 2021-12-24 MED ORDER — ENOXAPARIN SODIUM 60 MG/0.6ML IJ SOSY
50.0000 mg | PREFILLED_SYRINGE | INTRAMUSCULAR | Status: DC
  Administered 2021-12-24: 50 mg via SUBCUTANEOUS
  Filled 2021-12-24: qty 0.5

## 2021-12-24 MED ORDER — LACTATED RINGERS IV BOLUS (SEPSIS): 500.0000 mL | Freq: Once | INTRAVENOUS | Status: AC

## 2021-12-24 NOTE — ED Notes (Signed)
Patient handed off with [facility] for transport to [facility].  ?

## 2021-12-24 NOTE — H&P (Addendum)
? ?NAME:  Tony [name], MRN:  [id number], DOB:  July 23, 1941, LOS: 0 ?ADMISSION DATE:  12/24/2021, CONSULTATION DATE:  4/12 ?REFERRING MD:  Rogene Houston, CHIEF COMPLAINT: SOB and generalized weakness ? ?History of Present Illness:  ?Tony [name], is a 81 y.o. male, who presented to [facility] with a chief complaint of shortness of breath and generalized weakness ? 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 ?He developed shortness of breath and generalized weakness overnight on 4/11 - 4/12.  Denies nausea, vomiting, diarrhea ? ?ED course was notable for BP 84/58, initial oxygen saturation of 79%, and HR 135.  A code sepsis was initiated.  2 L of IV fluid and blood cultures were ordered, broad-spectrum antibiotics started, patient placed on BiPAP.  The patient was found to be COVID-positive.   ? ?PCCM was consulted for transfer and admission. ? ?Pertinent  Medical History  ?COPD, OSA, hypertension, HLD, sick sinus syndrome, trifascicular block s/p pacemaker placement, AAA W/O rupture, prostate cancer, anxiety ? ?Significant Hospital Events: ?Including procedures, antibiotic start and stop dates in addition to other pertinent events   ?4/12 presented to [facility], PCCM consult, COVID+, BC>, UA>,  ? ?Interim History / Subjective:  ?See above ? ?Subjective exam limited to patient on bipap. Denies chest pain, endorses SOB ? ?Objective   ?Blood pressure 95/66, pulse 69, temperature 99.7 ?F (37.6 ?C), temperature source Axillary, resp. rate (!) 24, height '5\' 8"'$  (1.727 m), weight 101.2 kg, SpO2 99 %. ?   ?Vent Mode: PCV;BIPAP ?FiO2 (%):  [3 %-40 %] 40 % ?Set Rate:  [12 bmp] 12 bmp ?PEEP:  [5 cmH20-6 cmH20] 6 cmH20 ?Pressure Support:  [10 cmH20] 10 cmH20  ? ?Intake/Output Summary (Last 24 hours) at 12/24/2021 1548 ?Last data filed at 12/24/2021 1521 ?Gross per 24 hour  ?Intake 1588.54  ml  ?Output --  ?Net 1588.54 ml  ? ?Filed Weights  ? 12/24/21 1055  ?Weight: 101.2 kg  ? ? ?Examination: ?General: In bed, NAD, appears comfortable ?HEENT: MM pink/moist, anicteric, atraumatic ?Neuro: RASS 0, PERRL 77m, follows commands ?CV: S1S2, atrial paced, no m/r/g appreciated ?PULM:  clear in the upper lobes, clear in the lower lobes, trachea midline, chest expansion symmetric ?GI: rounded, bsx4 active, non-tender   ?Extremities: cool/dry, no pretibial edema, capillary refill less than 3 seconds  ?Skin:  no rashes or lesions noted ? ?Pertinent labs ?Chest x-ray with no evidence of effusion, significant infiltrate, pneumo. ?Lactic acid 3.6>6.5 ?BUN 25, creatinine 1.44 ?AST 1288, ALT 643, bilirubin 2.8 ?Hemoglobin 12.5, platelet 156 ?ABG 7.41/37/61/23.8 ?Twelve-lead: Atrial paced complexes, right bundle branch block which was present in previous 12 leads, borderline Lateral ST depression ? ?Resolved Hospital Problem list   ? ? ?Assessment & Plan:  ?COVID 19 Virus infection ?Severe sepsis, suspect secondary to above ?Lactic acidosis, secondary to sepsis. Lactic acid 3.6>6.5 ?Presentation with BP 84/58, initial oxygen saturation of 79%, and HR 135. New onset SOB on 4/11-12. S/P 3500 ml fluid resusitation. ?-Goal MAP 65 or greater. Levophed ordered. Titrate medication to goal ?-Obtain/Follow up BC and UC ?-On Cefepime and Vancomycin. Narrow as cultures result ?-Trend lactate ?-Check MRSA PCR, PCT ?-Obtain ECHO ?-methylpred 0.'5mg'$  per KG with SSI for hyperglycemia ? ?Acute respiratory failure with hypoxia secondary to COVID-19 virus infection ?ABG 7.41/37/61/23.8, Chest x-ray with no evidence of effusion, significant infiltrate, pneumo. Sudden onset suspicious for PE. Bedside ECHO suspicious for RV  dilation. ?History of COPD ?History of OSA ?-Supportive care as above ?-CPAP nightly ?-As needed albuterol ?-Check CTA chest PE scan ? ?HTN, HLD ?HX Sick sinus syndrome and trifascicular block s/p pacemaker placement ?HX  AAA ?Last ECHO 10/22 EF 60-65% ?-Continue telemetry ?-Goal K above 4, goal MG above 2 ?-As needed EKG ?-Supportive care as above ?-Continue ASA 81 ?-Hold home metoprolol 25 mg twice daily, Lasix, atorvastatin. Hypotensive at [facility] ? ?Anxiety ?-Continue Celexa ?-As needed Ativan 0.5 mg twice daily p.o. ? ?Transaminitis  ?AST 1288, ALT 643, bilirubin 2.8, being seen outpatient for issues with jaundice.  Acute on chronic with sepsis worsening currently. ?-Monitor ?-Supportive care as above ?-Check CK, hepatitis panel ? ?Best Practice (right click and "Reselect all SmartList Selections" daily)  ? ?Diet/type: NPO w/ oral meds ?DVT prophylaxis: prophylactic heparin  ?GI prophylaxis: PPI ?Lines: N/A ?Foley:  N/A ?Code Status:  full code ?Last date of multidisciplinary goals of care discussion [pending] ? ?Labs   ?CBC: ?Recent Labs  ?Lab 12/24/21 ?1103 12/24/21 ?1313  ?WBC 4.7  --   ?NEUTROABS 4.0  --   ?HGB 12.5* 11.6*  ?HCT 38.1* 34.0*  ?MCV 94.1  --   ?PLT 156  --   ? ? ?Basic Metabolic Panel: ?Recent Labs  ?Lab 12/24/21 ?1103 12/24/21 ?1313  ?NA 139 139  ?K 3.6 3.5  ?CL 102  --   ?CO2 25  --   ?GLUCOSE 132*  --   ?BUN 25*  --   ?CREATININE 1.14  --   ?CALCIUM 8.5*  --   ? ?GFR: ?Estimated Creatinine Clearance: 60.6 mL/min (by C-G formula based on SCr of 1.14 mg/dL). ?Recent Labs  ?Lab 12/24/21 ?1103 12/24/21 ?1315  ?WBC 4.7  --   ?LATICACIDVEN 3.6* 6.5*  ? ? ?Liver Function Tests: ?Recent Labs  ?Lab 12/24/21 ?1103  ?AST 1,288*  ?ALT 643*  ?ALKPHOS 98  ?BILITOT 2.8*  ?PROT 5.9*  ?ALBUMIN 3.7  ? ?No results for input(s): LIPASE, AMYLASE in the last 168 hours. ?No results for input(s): AMMONIA in the last 168 hours. ? ?ABG ?   ?Component Value Date/Time  ? PHART 7.412 12/24/2021 1313  ? PCO2ART 37.3 12/24/2021 1313  ? PO2ART 61 (L) 12/24/2021 1313  ? HCO3 23.8 12/24/2021 1313  ? TCO2 25 12/24/2021 1313  ? ACIDBASEDEF 1.0 12/24/2021 1313  ? O2SAT 92 12/24/2021 1313  ?  ? ?Coagulation Profile: ?Recent Labs  ?Lab  12/24/21 ?1103  ?INR 1.1  ? ? ?Cardiac Enzymes: ?No results for input(s): CKTOTAL, CKMB, CKMBINDEX, TROPONINI in the last 168 hours. ? ?HbA1C: ?Hgb A1c MFr Bld  ?Date/Time Value Ref Range Status  ?06/23/2021 11:32 AM 6.1 4.6 - 6.5 % Final  ?  Comment:  ?  Glycemic Control Guidelines for People with Diabetes:Non Diabetic:  <6%Goal of Therapy: <7%Additional Action Suggested:  >8%   ?07/19/2020 05:58 AM 5.9 (H) 4.8 - 5.6 % Final  ?  Comment:  ?  (NOTE) ?Pre diabetes:          5.7%-6.4% ? ?Diabetes:              >6.4% ? ?Glycemic control for   <7.0% ?adults with diabetes ?  ? ? ?CBG: ?No results for input(s): GLUCAP in the last 168 hours. ? ?Review of Systems:   ?ROS limited due to patient being on BiPAP ? ?Past Medical History:  ?He,  has a past medical history of COPD (chronic obstructive pulmonary disease) ([place]), Gastric outlet obstruction (02/03/2018), GERD (gastroesophageal reflux disease),  History of hiatal hernia, Hypertension, Presence of permanent cardiac pacemaker, Prostate cancer ([place]) (02/08/2014), Radiation, Sinus problem, and SSS (sick sinus syndrome) ([place]).  ? ?Surgical History:  ? ?Past Surgical History:  ?Procedure Laterality Date  ? APPENDECTOMY  2019  ? hx hematoma s/p appendectomy  ? ESOPHAGOGASTRODUODENOSCOPY (EGD) WITH PROPOFOL N/A 02/03/2018  ? Procedure: ESOPHAGOGASTRODUODENOSCOPY (EGD) WITH PROPOFOL;  Surgeon: Clarene Essex, MD;  Location: [facility];  Service: Endoscopy;  Laterality: N/A;  ? FRACTURE SURGERY    ? INSERT / REPLACE / REMOVE PACEMAKER  06/14/2018  ? LAPAROSCOPIC APPENDECTOMY N/A 01/21/2018  ? Procedure: APPENDECTOMY LAPAROSCOPIC;  Surgeon: Kinsinger, Arta Bruce, MD;  Location: [facility];  Service: General;  Laterality: N/A;  ? PACEMAKER IMPLANT N/A 06/14/2018  ? Procedure: PACEMAKER IMPLANT - Dual Chamber;  Surgeon: Sanda Klein, MD;  Location: [facility];  Service: Cardiovascular;  Laterality: N/A;  ? PROSTATE BIOPSY  02/08/14  ? gleason 4+5=9, 12/12 cores positive, 54 gm  ?  RADIOACTIVE SEED IMPLANT N/A 06/29/2014  ? Procedure: RADIOACTIVE SEED IMPLANT;  Surgeon: Bernestine Amass, MD;  Location: [facility];  Service: Urology;  Laterality: N/A;  ? WRIST FRACTURE SURGERY Ri

## 2021-12-24 NOTE — ED Notes (Signed)
[facility] at bedside 

## 2021-12-24 NOTE — ED Notes (Signed)
ABG obtained on 3 L [place]. Results in Epic. ?

## 2021-12-24 NOTE — ED Notes (Signed)
Spoke with granddaughter and given updates as per patient ?

## 2021-12-24 NOTE — Sepsis Progress Note (Signed)
Lactic acid 3.6 -> 6.5.  Pt is transporting via [facility] to [facility] 2H. He has had 3L of LR . An additional  537m LR bolus ordered has not been started yet. [facility] ED RN states [facility] may be able to give [facility]mrem[facility] a Lactic acid when he arrives there. His fluid resuscitation needs for 101.2kg are [id number] he has met. ?

## 2021-12-24 NOTE — Progress Notes (Signed)
Pharmacy Antibiotic Note ? ?Tony [name] is a 81 y.o. male admitted on 12/24/2021 with sepsis.  Pharmacy has been consulted for vancomycin and cefepime dosing. Pt is afebrile and WBC WNL. Scr is WNL and lactic acid is elevated.  ? ?Plan: ?Vancomycin 2gm IV x 1 then '1250mg'$  IV Q24H ?Cefepime 2gm IV Q8H ?F/u renal fxn, C&S, clinical status and peak/trough at [facility] ? ?Height: '5\' 8"'$  (172.7 cm) ?Weight: 101.2 kg (223 lb) ?IBW/kg (Calculated) : 68.4 ? ?Temp (24hrs), Avg:98.3 ?F (36.8 ?C), Min:98.3 ?F (36.8 ?C), Max:98.3 ?F (36.8 ?C) ? ?Recent Labs  ?Lab 12/24/21 ?1103  ?WBC 4.7  ?CREATININE 1.14  ?LATICACIDVEN 3.6*  ?  ?Estimated Creatinine Clearance: 60.6 mL/min (by C-G formula based on SCr of 1.14 mg/dL).   ? ?Allergies  ?Allergen Reactions  ? Penicillins Rash  ?  Has patient had a PCN reaction causing immediate rash, facial/tongue/throat swelling, SOB or lightheadedness with hypotension: Yes ?Has patient had a PCN reaction causing severe rash involving mucus membranes or skin necrosis: No ?Has patient had a PCN reaction that required hospitalization: No ?Has patient had a PCN reaction occurring within the last 10 years: No ?If all of the above answers are "NO", then may proceed with Cephalosporin use.  ? ? ?Antimicrobials this admission: ?Vanc 4/12>> ?Cefepime 4/12>> ?Flagyl x 1 4/12 ? ?Dose adjustments this admission: ?N/A ? ?Microbiology results: ?Pending ? ?Thank you for allowing pharmacy to be a part of this patient?s care. ? ?Symantha Steeber, Rande Lawman ?12/24/2021 11:19 AM ? ?

## 2021-12-24 NOTE — Progress Notes (Signed)
[facility] Physician-Brief Progress Note ?Patient Name: Tony [name] ?DOB: 05-21-1941 ?MRN: [id number] ? ? ?Date of Service ? 12/24/2021  ?HPI/Events of Note ? Chest CTA reveals no evidence of pulmonary emboli.  ?eICU Interventions ? Will change full dose Lovenox order to prophylactic dose of Lovenox 40 mg [place] Q day.   ? ? ? ?Intervention Category ?Major Interventions: Other: ? ?Tony [name] ?12/24/2021, 11:02 PM ?

## 2021-12-24 NOTE — Sepsis Progress Note (Signed)
Code Sepsis protocol being monitored by [facility]. 

## 2021-12-24 NOTE — ED Notes (Signed)
Blood cultures x 2 drawn before start of antibotics ?

## 2021-12-24 NOTE — ED Notes (Signed)
ED Provider at bedside.  Ringer lactated bolus started for low BP ?

## 2021-12-24 NOTE — ED Notes (Signed)
Handoff report given to [place] on 2H at [facility] ?

## 2021-12-24 NOTE — ED Notes (Signed)
CRITICAL VALUE STICKER ? ?CRITICAL VALUE:Lactic acid 3.6 ? ?RECEIVER (on-site recipient of call):Shawnie Pons, RN ? ?DATE & TIME NOTIFIED: 12-24-2021 1138 ? ?MESSENGER (representative from lab): ? ?MD NOTIFIED: Dr. Rogene Houston ? ?TIME OF NOTIFICATION:1138 ? ?RESPONSE:  ? ?

## 2021-12-24 NOTE — Telephone Encounter (Signed)
Called patient's spouse regarding upcoming appointment, patient has been called and notified. ?

## 2021-12-24 NOTE — ED Notes (Signed)
CRITICAL VALUE STICKER ? ?CRITICAL VALUE:Lactic acid 6.5 ? ?RECEIVER (on-site recipient of call):Shawnie Pons, RN ? ?DATE & TIME NOTIFIED: 12-24-2021 1351 ? ?MESSENGER (representative from lab): ? ?MD NOTIFIED: Dr. Rogene Houston ? ?TIME OF NOTIFICATION:1351 ? ?RESPONSE:  ? ?

## 2021-12-24 NOTE — ED Notes (Signed)
Attempt to call daughter at patient request for no answer ?

## 2021-12-24 NOTE — ED Provider Notes (Addendum)
?[facility] ?Provider Note ? ? ?CSN: [id number] ?Arrival date & time: 12/24/21  1047 ? ?  ? ?History ? ?Chief Complaint  ?Patient presents with  ? Shortness of Breath  ? Weakness  ? ? ?Tony [name] is a 81 y.o. male. ? ?Patient was fine yesterday.  Got sick during the night.  And weakness shortness of breath this morning he awoke with lots of trouble breathing.  Normally uses CPAP at night does not use oxygen during the day.  Came in here with low oxygen levels.  And hypotensive.  Based on this although there was not a fever respiratory rate was also 27 sepsis protocol was initiated.  His blood pressures did respond to that initially but he has been a little borderline and occasionally has some blood pressures below 90.  Patient is on 4 L of oxygen his oxygen sats are good but he remains very tachypneic.  Patient has had the COVID vaccines but the last 1 was in the fall.  Past medical history is significant for hypertension prostate cancer reflux disease gastric outlet obstruction he has a permanent cardiac pacemaker secondary to sick sinus syndrome.  Has a history of COPD.  He was not complaining of any wheezing.  Patient denied fever but felt hot.  Patient's wife says that he is having trouble speaking because he is short of breath.  ? ? ?  ? ?Home Medications ?Prior to Admission medications   ?Medication Sig Start Date End Date Taking? Authorizing Provider  ?abiraterone acetate (ZYTIGA) 250 MG tablet TAKE 4 TABLETS (1,000 MG TOTAL) BY MOUTH DAILY. TAKE ON AN EMPTY STOMACH 1 HOUR BEFORE OR 2 HOURS AFTER A MEAL 12/19/21 12/19/22  Wyatt Portela, MD  ?acetaminophen (TYLENOL) 500 MG tablet Take 1,000 mg by mouth every 6 (six) hours as needed for headache or moderate pain (pain).    [provider]  ?albuterol (VENTOLIN HFA) 108 (90 Base) MCG/ACT inhaler TAKE 2 PUFFS BY MOUTH EVERY 6 HOURS AS NEEDED FOR WHEEZE OR SHORTNESS OF BREATH ?Patient taking differently: Inhale 2 puffs  into the lungs every 6 (six) hours as needed for wheezing or shortness of breath. 04/16/21   Baird Lyons D, MD  ?Ascorbic Acid (VITAMIN C PO) Take 1 tablet by mouth daily.    [provider]  ?aspirin EC 81 MG tablet Take 81 mg by mouth daily.    [provider]  ?atorvastatin (LIPITOR) 20 MG tablet TAKE 1 TABLET BY MOUTH EVERY DAY 12/08/21   Croitoru, Mihai, MD  ?budesonide-formoterol (SYMBICORT) 160-4.5 MCG/ACT inhaler Inhale 2 puffs then rinse mouth, twice daily ?Patient taking differently: Inhale 2 puffs into the lungs 2 (two) times daily. Inhale 2 puffs then rinse mouth, twice daily 04/16/21   Baird Lyons D, MD  ?carbamide peroxide (DEBROX) 6.5 % OTIC solution Place 5 drops into both ears daily as needed (for earwax).    [provider]  ?citalopram (CELEXA) 10 MG tablet TAKE 1 TABLET BY MOUTH EVERY DAY 12/08/21   [name], [name], MD  ?diclofenac Sodium (VOLTAREN) 1 % GEL AAPLY 4 GRAMS TOPICALLY 4 TIMES DAILY ?Patient taking differently: Apply 2 g topically daily as needed (pain). 02/14/21   [name], [name], MD  ?docusate sodium (COLACE) 100 MG capsule Take 100 mg by mouth daily with supper.    [provider]  ?fluticasone (FLONASE) 50 MCG/ACT nasal spray PLACE 1 SPRAY INTO BOTH NOSTRILS DAILY AS NEEDED FOR ALLERGIES OR RHINITIS (AT BEDTIME). ?Patient taking differently:  Place 1 spray into both nostrils at bedtime as needed for allergies or rhinitis. 09/04/19   [name], [name], MD  ?furosemide (LASIX) 20 MG tablet TAKE 1 TABLET BY MOUITH DAILY FOR 5 DAYS , THEN DAILY AS NEEDED FOR EDEMA ?Patient taking differently: Take 20 mg by mouth daily. 07/01/21   [name], [name], MD  ?LORazepam (ATIVAN) 1 MG tablet Take 0.5-1 tablets (0.5-1 mg total) by mouth 2 (two) times daily as needed for anxiety. 11/10/21   [name], [name], MD  ?metoprolol tartrate (LOPRESSOR) 25 MG tablet TAKE 1 TABLET BY MOUTH TWICE A DAY 09/30/21   Croitoru, Mihai, MD  ?Multiple Vitamin (MULTIVITAMIN WITH MINERALS)  TABS tablet Take 0.5 tablets by mouth daily. Centrum    [provider]  ?omeprazole (PRILOSEC) 20 MG capsule TAKE 1 CAPSULE (20 MG TOTAL) BY MOUTH DAILY BEFORE BREAKFAST. 04/18/21   [name], [name], MD  ?predniSONE (DELTASONE) 5 MG tablet Take 1 tablet (5 mg total) by mouth daily with breakfast. 12/09/21   Wyatt Portela, MD  ?Tetrahydrozoline HCl (VISINE OP) Place 1 drop into both eyes daily as needed (red eyes).    [provider]  ?   ? ?Allergies    ?Penicillins   ? ?Review of Systems   ?Review of Systems  ?Constitutional:  Negative for chills and fever.  ?HENT:  Negative for ear pain and sore throat.   ?Eyes:  Negative for pain and visual disturbance.  ?Respiratory:  Positive for shortness of breath. Negative for cough and wheezing.   ?Cardiovascular:  Negative for chest pain and palpitations.  ?Gastrointestinal:  Negative for abdominal pain and vomiting.  ?Genitourinary:  Negative for dysuria and hematuria.  ?Musculoskeletal:  Negative for arthralgias and back pain.  ?Skin:  Negative for color change and rash.  ?Neurological:  Negative for seizures and syncope.  ?All other systems reviewed and are negative. ? ?Physical Exam ?Updated Vital Signs ?BP (!) 100/58   Pulse (!) 135   Temp 97.8 ?F (36.6 ?C) (Oral)   Resp (!) 36   Ht 1.727 m ('5\' 8"'$ )   Wt 101.2 kg   SpO2 97%   BMI 33.91 kg/m?  ?Physical Exam ?Vitals and nursing note reviewed.  ?Constitutional:   ?   General: He is in acute distress.  ?   Appearance: Normal appearance. He is well-developed. He is toxic-appearing.  ?HENT:  ?   Head: Normocephalic and atraumatic.  ?Eyes:  ?   Extraocular Movements: Extraocular movements intact.  ?   Conjunctiva/sclera: Conjunctivae normal.  ?   Pupils: Pupils are equal, round, and reactive to light.  ?Cardiovascular:  ?   Rate and Rhythm: Normal rate and regular rhythm.  ?   Heart sounds: No murmur heard. ?Pulmonary:  ?   Effort: Respiratory distress present.  ?   Breath sounds: Normal breath  sounds. No wheezing, rhonchi or rales.  ?Abdominal:  ?   Palpations: Abdomen is soft.  ?   Tenderness: There is no abdominal tenderness.  ?Musculoskeletal:     ?   General: No swelling.  ?   Cervical back: Normal range of motion and neck supple.  ?Skin: ?   General: Skin is warm and dry.  ?   Capillary Refill: Capillary refill takes less than 2 seconds.  ?Neurological:  ?   General: No focal deficit present.  ?   Mental Status: He is alert and oriented to person, place, and time.  ?Psychiatric:     ?   Mood  and Affect: Mood normal.  ? ? ?ED Results / Procedures / Treatments   ?Labs ?(all labs ordered are listed, but only abnormal results are displayed) ?Labs Reviewed  ?RESP PANEL BY RT-PCR (FLU A&B, COVID) ARPGX2 - Abnormal; Notable for the following components:  ?    Result Value  ? SARS Coronavirus 2 by RT PCR POSITIVE (*)   ? All other components within normal limits  ?LACTIC ACID, PLASMA - Abnormal; Notable for the following components:  ? Lactic Acid, Venous 3.6 (*)   ? All other components within normal limits  ?COMPREHENSIVE METABOLIC PANEL - Abnormal; Notable for the following components:  ? Glucose, Bld 132 (*)   ? BUN 25 (*)   ? Calcium 8.5 (*)   ? Total Protein 5.9 (*)   ? AST 1,288 (*)   ? ALT 643 (*)   ? Total Bilirubin 2.8 (*)   ? All other components within normal limits  ?CBC WITH DIFFERENTIAL/PLATELET - Abnormal; Notable for the following components:  ? RBC 4.05 (*)   ? Hemoglobin 12.5 (*)   ? HCT 38.1 (*)   ? Lymphs Abs 0.3 (*)   ? All other components within normal limits  ?I-STAT ARTERIAL BLOOD GAS, ED - Abnormal; Notable for the following components:  ? pO2, Arterial 61 (*)   ? HCT 34.0 (*)   ? Hemoglobin 11.6 (*)   ? All other components within normal limits  ?CULTURE, BLOOD (ROUTINE X 2)  ?CULTURE, BLOOD (ROUTINE X 2)  ?URINE CULTURE  ?PROTIME-INR  ?APTT  ?LACTIC ACID, PLASMA  ?URINALYSIS, ROUTINE W REFLEX MICROSCOPIC  ? ? ?EKG ?EKG Interpretation ? ?Date/Time:  Wednesday December 24 2021  10:58:32 EDT ?Ventricular Rate:  93 ?PR Interval:  58 ?QRS Duration: 147 ?QT Interval:  414 ?QTC Calculation: 515 ?R Axis:   36 ?Text Interpretation: Atrial-paced complexes Right bundle branch block Borderline ST depres

## 2021-12-24 NOTE — Plan of Care (Signed)

## 2021-12-24 NOTE — Progress Notes (Signed)
ANTICOAGULATION CONSULT NOTE - Initial Consult ? ?Pharmacy Consult for Lovenox ?Indication: r/o pulmonary embolus ? ?Allergies  ?Allergen Reactions  ? Penicillins Rash  ?  Has patient had a PCN reaction causing immediate rash, facial/tongue/throat swelling, SOB or lightheadedness with hypotension: Yes ?Has patient had a PCN reaction causing severe rash involving mucus membranes or skin necrosis: No ?Has patient had a PCN reaction that required hospitalization: No ?Has patient had a PCN reaction occurring within the last 10 years: No ?If all of the above answers are "NO", then may proceed with Cephalosporin use.  ? ? ?Patient Measurements: ?Height: '5\' 8"'$  (172.7 cm) ?Weight: 101.2 kg (223 lb) ?IBW/kg (Calculated) : 68.4 ? ?Vital Signs: ?Temp: 99.7 ?F (37.6 ?C) (04/12 1526) ?Temp Source: Axillary (04/12 1526) ?BP: 104/68 (04/12 1700) ?Pulse Rate: 73 (04/12 1700) ? ?Labs: ?Recent Labs  ?  12/24/21 ?1103 12/24/21 ?1313 12/24/21 ?1631  ?HGB 12.5* 11.6*  --   ?HCT 38.1* 34.0*  --   ?PLT 156  --   --   ?APTT 26  --   --   ?LABPROT 14.3  --   --   ?INR 1.1  --   --   ?CREATININE 1.14  --   --   ?CKTOTAL  --   --  73  ?TROPONINIHS  --   --  42*  ? ? ?Estimated Creatinine Clearance: 60.6 mL/min (by C-G formula based on SCr of 1.14 mg/dL). ? ? ?Medical History: ?Past Medical History:  ?Diagnosis Date  ? COPD (chronic obstructive pulmonary disease) ([place])   ? Gastric outlet obstruction 02/03/2018  ? [name] 02/03/2018  ? GERD (gastroesophageal reflux disease)   ? History of hiatal hernia   ? small/notes 02/03/2018  ? Hypertension   ? no meds  ? Presence of permanent cardiac pacemaker   ? Prostate cancer ([place]) 02/08/2014  ? Gleason 4+5=9, PSA 15.65  ? Radiation   ? Sinus problem   ? SSS (sick sinus syndrome) ([place])   ? ? ?Medications:  ?Awaiting med rec ? ?Assessment: ?81 y.o. M presents with acute hypoxic respiratory failure due to COVID. Pt with bedside echo showing RV dilatation and hypokinesis. To begin full-dose Lovenox for r/o  PE. Pt received Lovenox '50mg'$  (VTE dose) ~1700. CBC ok on admission. ? ?Goal of Therapy:  ?Anti-Xa level 0.6-1 units/ml 4hrs after LMWH dose given ?Monitor platelets by anticoagulation protocol: Yes ?  ?Plan:  ?Lovenox '50mg'$  now to equal total dose of '100mg'$  ?Lovenox '100mg'$  SQ q12h ?CBC q72h while on Lovenox ?F/u CT results ? ?Sherlon Handing, PharmD, BCPS ?Please see amion for complete clinical pharmacist phone list ?12/24/2021,5:57 PM ? ? ?

## 2021-12-24 NOTE — ED Notes (Signed)
MC RT given report on patient at this time. Patient is waiting for [employer].  ?

## 2021-12-24 NOTE — ED Triage Notes (Signed)
Patient c/o SOB and generalized weakness since last night. Patient went to bed normal and woke up like this. Denies N/V/D ?

## 2021-12-24 NOTE — Plan of Care (Signed)
?  Interdisciplinary Goals of Care Family Meeting ? ? ?Date carried out:: 12/24/2021 ? ?Location of the meeting: Bedside ? ?Member's involved: Nurse Practitioner and Family Member or next of kin and patient. Tony [name] (spouse), Tony [name] (daughter), Granddaughter via facetime  ? ?Durable Power of Tour manager: Patient and wife Tony [name]   ? ?Discussion: We discussed goals of care for [name]. We discussed code status specifically. ? ?I explained that CPR was not a benign intervention would not guarantee that the patient restored to their previous quality of life and could cause harm.  ? ?After explanation of CPR and intubation with mechanical ventilation the patient and wife expressed that they would like the full scope of care. ? ?Code status: Full Code ? ?Disposition: Continue current acute care ? ? ?Time spent for the meeting: 12 minutes ? ?Tony [name] ?12/24/2021, 6:25 PM ? ?

## 2021-12-25 ENCOUNTER — Inpatient Hospital Stay (HOSPITAL_COMMUNITY): Payer: Medicare Other

## 2021-12-25 DIAGNOSIS — B9620 Unspecified Escherichia coli [E. coli] as the cause of diseases classified elsewhere: Secondary | ICD-10-CM

## 2021-12-25 DIAGNOSIS — U071 COVID-19: Secondary | ICD-10-CM | POA: Diagnosis not present

## 2021-12-25 DIAGNOSIS — J9601 Acute respiratory failure with hypoxia: Secondary | ICD-10-CM | POA: Diagnosis not present

## 2021-12-25 DIAGNOSIS — R7881 Bacteremia: Secondary | ICD-10-CM | POA: Diagnosis not present

## 2021-12-25 LAB — BLOOD CULTURE ID PANEL (REFLEXED) - BCID2

## 2021-12-25 LAB — CBC WITH DIFFERENTIAL/PLATELET
Abs Immature Granulocytes: 0.03 10*3/uL (ref 0.00–0.07)
Basophils Absolute: 0 10*3/uL (ref 0.0–0.1)
Basophils Relative: 0 %
Eosinophils Absolute: 0 10*3/uL (ref 0.0–0.5)
Eosinophils Relative: 0 %
HCT: 38.9 % — ABNORMAL LOW (ref 39.0–52.0)
Hemoglobin: 12.6 g/dL — ABNORMAL LOW (ref 13.0–17.0)
Immature Granulocytes: 0 %
Lymphocytes Relative: 3 %
Lymphs Abs: 0.2 10*3/uL — ABNORMAL LOW (ref 0.7–4.0)
MCH: 30.8 pg (ref 26.0–34.0)
MCHC: 32.4 g/dL (ref 30.0–36.0)
MCV: 95.1 fL (ref 80.0–100.0)
Monocytes Absolute: 0.1 10*3/uL (ref 0.1–1.0)
Monocytes Relative: 2 %
Neutro Abs: 7.9 10*3/uL — ABNORMAL HIGH (ref 1.7–7.7)
Neutrophils Relative %: 95 %
Platelets: 129 10*3/uL — ABNORMAL LOW (ref 150–400)
RBC: 4.09 MIL/uL — ABNORMAL LOW (ref 4.22–5.81)
RDW: 14.3 % (ref 11.5–15.5)
WBC: 8.3 10*3/uL (ref 4.0–10.5)
nRBC: 0 % (ref 0.0–0.2)

## 2021-12-25 LAB — LACTIC ACID, PLASMA: Lactic Acid, Venous: 1 mmol/L (ref 0.5–1.9)

## 2021-12-25 LAB — GLUCOSE, CAPILLARY
Glucose-Capillary: 138 mg/dL — ABNORMAL HIGH (ref 70–99)
Glucose-Capillary: 139 mg/dL — ABNORMAL HIGH (ref 70–99)
Glucose-Capillary: 149 mg/dL — ABNORMAL HIGH (ref 70–99)
Glucose-Capillary: 151 mg/dL — ABNORMAL HIGH (ref 70–99)
Glucose-Capillary: 152 mg/dL — ABNORMAL HIGH (ref 70–99)
Glucose-Capillary: 166 mg/dL — ABNORMAL HIGH (ref 70–99)

## 2021-12-25 LAB — COMPREHENSIVE METABOLIC PANEL
ALT: 486 U/L — ABNORMAL HIGH (ref 0–44)
AST: 618 U/L — ABNORMAL HIGH (ref 15–41)
Albumin: 3 g/dL — ABNORMAL LOW (ref 3.5–5.0)
Alkaline Phosphatase: 101 U/L (ref 38–126)
Anion gap: 7 (ref 5–15)
BUN: 17 mg/dL (ref 8–23)
CO2: 25 mmol/L (ref 22–32)
Calcium: 8.4 mg/dL — ABNORMAL LOW (ref 8.9–10.3)
Chloride: 107 mmol/L (ref 98–111)
Creatinine, Ser: 1.05 mg/dL (ref 0.61–1.24)
GFR, Estimated: >60 mL/min (ref >60)
Glucose, Bld: 133 mg/dL — ABNORMAL HIGH (ref 70–99)
Potassium: 3.8 mmol/L (ref 3.5–5.1)
Sodium: 139 mmol/L (ref 135–145)
Total Bilirubin: 6.1 mg/dL — ABNORMAL HIGH (ref 0.3–1.2)
Total Protein: 5.8 g/dL — ABNORMAL LOW (ref 6.5–8.1)

## 2021-12-25 LAB — MAGNESIUM: Magnesium: 1.9 mg/dL (ref 1.7–2.4)

## 2021-12-25 LAB — PHOSPHORUS: Phosphorus: 3.9 mg/dL (ref 2.5–4.6)

## 2021-12-25 MED ORDER — METRONIDAZOLE 500 MG/100ML IV SOLN
500.0000 mg | Freq: Two times a day (BID) | INTRAVENOUS | Status: DC
  Administered 2021-12-25 – 2022-01-01 (×15): 500 mg via INTRAVENOUS
  Filled 2021-12-25 (×15): qty 100

## 2021-12-25 MED ORDER — PIPERACILLIN-TAZOBACTAM 3.375 G IVPB: 3.3750 g | Freq: Three times a day (TID) | INTRAVENOUS | Status: DC

## 2021-12-25 MED ORDER — CHLORHEXIDINE GLUCONATE CLOTH 2 % EX PADS
6.0000 | MEDICATED_PAD | Freq: Every day | CUTANEOUS | Status: DC
  Administered 2021-12-25 – 2021-12-30 (×6): 6 via TOPICAL

## 2021-12-25 MED ORDER — ABIRATERONE ACETATE 250 MG PO TABS: 1000.0000 mg | ORAL_TABLET | Freq: Every day | ORAL | Status: DC

## 2021-12-25 MED ORDER — POTASSIUM CHLORIDE CRYS ER 20 MEQ PO TBCR
20.0000 meq | EXTENDED_RELEASE_TABLET | Freq: Once | ORAL | Status: AC
  Administered 2021-12-25: 20 meq via ORAL
  Filled 2021-12-25: qty 1

## 2021-12-25 MED ORDER — PIPERACILLIN-TAZOBACTAM 3.375 G IVPB 30 MIN
3.3750 g | Freq: Once | INTRAVENOUS | Status: DC
  Filled 2021-12-25: qty 50

## 2021-12-25 MED ORDER — ALBUTEROL SULFATE HFA 108 (90 BASE) MCG/ACT IN AERS
2.0000 | INHALATION_SPRAY | RESPIRATORY_TRACT | Status: DC | PRN
  Administered 2021-12-30: 2 via RESPIRATORY_TRACT
  Filled 2021-12-25: qty 6.7

## 2021-12-25 MED ORDER — MAGNESIUM SULFATE 2 GM/50ML IV SOLN
2.0000 g | Freq: Once | INTRAVENOUS | Status: AC
  Administered 2021-12-25: 2 g via INTRAVENOUS
  Filled 2021-12-25: qty 50

## 2021-12-25 MED ORDER — SODIUM CHLORIDE 0.9 % IV SOLN
2.0000 g | INTRAVENOUS | Status: DC
  Administered 2021-12-25 – 2021-12-31 (×7): 2 g via INTRAVENOUS
  Filled 2021-12-25 (×8): qty 20

## 2021-12-25 MED ORDER — ORAL CARE MOUTH RINSE
15.0000 mL | Freq: Two times a day (BID) | OROMUCOSAL | Status: DC
  Administered 2021-12-26 – 2022-01-01 (×13): 15 mL via OROMUCOSAL

## 2021-12-25 MED ORDER — DEXAMETHASONE SODIUM PHOSPHATE 10 MG/ML IJ SOLN
6.0000 mg | INTRAMUSCULAR | Status: DC
  Administered 2021-12-26 – 2021-12-29 (×4): 6 mg via INTRAVENOUS
  Filled 2021-12-25 (×4): qty 0.6

## 2021-12-25 NOTE — Progress Notes (Signed)
Initial Nutrition Assessment ? ?DOCUMENTATION CODES:  ? ?Not applicable ? ?INTERVENTION:  ? ?Supplement diet once advanced to meet nutrition needs ? ? ?NUTRITION DIAGNOSIS:  ? ?Increased nutrient needs related to  (sepsis) as evidenced by estimated needs. ? ?GOAL:  ? ?Patient will meet greater than or equal to 90% of their needs ? ?MONITOR:  ? ?Diet advancement, PO intake ? ?REASON FOR ASSESSMENT:  ? ?Consult ?Enteral/tube feeding initiation and management ? ?ASSESSMENT:  ? 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  ? ?Consult for nutrition assessment and TF initiation received. Pt without enteral access. Spoke with pt's RN, plan to start diet after ab [place]. [place] of abd to eval gallbladder due to hyperbilirubinemia  ?O2: 6L via HFNC ?Pt started on heparin due to concern for PE ? ? ?Medications reviewed and include: vitamin C 500 mg daily, decadron, colace, folic acid, SSI, MVI with minerals, protonix, thiamine, zinc 220 mg daily (started 4/12) ?IV Rocephin, Flagy ? ?Labs reviewed: AST: 618, ALT: 486, T Bilirubin: 6.1 ?CBG's: 138-152  ? ?Diet Order:   ?Diet Order   ? ?       ?  Diet NPO time specified  Diet effective now       ?  ? ?  ?  ? ?  ? ? ?EDUCATION NEEDS:  ? ?No education needs have been identified at this time ? ?Skin:  Skin Assessment: Reviewed RN Assessment ? ?Last BM:  unknown ? ?Height:  ? ?Ht Readings from Last 1 Encounters:  ?12/24/21 '5\' 8"'$  (1.727 m)  ? ? ?Weight:  ? ?Wt Readings from Last 1 Encounters:  ?12/25/21 107.2 kg  ? ?BMI:  Body mass index is 35.93 kg/m?. ? ?Estimated Nutritional Needs:  ? ?Kcal:  1900-2100 ? ?Protein:  120-140 grams ? ?Fluid:  >1.9 L/day ? ?Lockie Pares., RD, LDN, CNSC ?See AMiON for contact information  ? ?

## 2021-12-25 NOTE — Progress Notes (Signed)
? ?NAME:  Tony [name], MRN:  [id number], DOB:  [birth year], LOS: 1 ?ADMISSION DATE:  12/24/2021, CONSULTATION DATE:  4/12 ?REFERRING MD:  Rogene Houston, CHIEF COMPLAINT: SOB and generalized weakness ? ?History of Present Illness:  ?Tony [name], is a 81 y.o. male, who presented to [facility] with a chief complaint of shortness of breath and generalized weakness ? 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 ?He developed shortness of breath and generalized weakness overnight on 4/11 - 4/12.  Denies nausea, vomiting, diarrhea ? ?ED course was notable for BP 84/58, initial oxygen saturation of 79%, and HR 135.  A code sepsis was initiated.  2 L of IV fluid and blood cultures were ordered, broad-spectrum antibiotics started, patient placed on BiPAP.  The patient was found to be COVID-positive.   ? ?PCCM was consulted for transfer and admission. ? ?Pertinent  Medical History  ?COPD, OSA, hypertension, HLD, sick sinus syndrome, trifascicular block s/p pacemaker placement, AAA W/O rupture, prostate cancer, anxiety ? ?Significant Hospital Events: ?Including procedures, antibiotic start and stop dates in addition to other pertinent events   ?4/12 presented to [facility], PCCM consult, COVID+, BC>, UA>, started on heparin empirically due to concern for PE, started on cefepime, vanc, flagyl ? ?Interim History / Subjective:  ?This morning he feels well. He has a history of ch ? ?Objective   ?Blood pressure 103/70, pulse 64, temperature 98.2 ?F (36.8 ?C), temperature source Oral, resp. rate 19, height '5\' 8"'$  (1.727 m), weight 107.2 kg, SpO2 93 %. ?   ?Vent Mode: BIPAP;PCV ?FiO2 (%):  [3 %-40 %] 40 % ?Set Rate:  [12 bmp] 12 bmp ?PEEP:  [5 cmH20-6 cmH20] 6 cmH20 ?Pressure Support:  [10 cmH20] 10 cmH20  ? ?Intake/Output Summary (Last 24 hours) at 12/25/2021 0726 ?Last data filed at 12/25/2021  0600 ?Gross per 24 hour  ?Intake 1788.54 ml  ?Output 1400 ml  ?Net 388.54 ml  ? ? ?Filed Weights  ? 12/24/21 1055 12/25/21 0500  ?Weight: 101.2 kg 107.2 kg  ? ? ?Examination: ?General: elderly man sitting up in bed in NAD ?HEENT: [place]/AT, eyes anicteric  ?Neuro: awake, alert, moving all extremities ?CV: S1S2, RRR ?PULM: CTAB anteriorly, no wheezing ?GI: obese, soft, NT, neg Murphy's sign ?Extremities: no significant edema, no cyanosis ?Skin:  warm, dry, no rashes ? ?CTA personally reviewed> no PE, large heart, emphysema, bilateral GGOs diffusely ? ?BUN 17 ?Cr 1.05 ?AST 618 ?ALT 486 ?T bili 6.1 ?LA 1.0 ?PCT 20.58 ?Blood culture> GNR 4/4, E coli on biofire ? ?Resolved Hospital Problem list   ? ? ?Assessment & Plan:  ?Acute respiratory failure due to COVID 19 viral pneumonia ?No evidence of PE ?COPD, not acutely exacerbated ?History of OSA ?-stop baricitinib with bacteremia ?-con't steroids-- decreased to dexamethasone ?-con't supplemental O2 as required to maintain SpO2 >88% ?-CPAP QHS ?-albuterol PRN, dulera BID ? ?Sepsis due to GNR bacteremia; concerned for biliary source of infection ?Lactic acidosis, resolved ?-con't to follow cultures ?-deescalate antibiotics to metronidazole & cefepime ?-has not required vasopressors; would initiate if MAP <65 ?-RUQ [place] to evaluate for cholecystitis ? ?HTN, HLD ?HX Sick sinus syndrome and trifascicular block s/p pacemaker placement ?HX infrarenal AAA ?Last ECHO 10/22 EF 60-65% ?-telemetry monitoring ?-con't daily aspirin ?-holding PTA antihypertensives, lasix ?-holding PTA atorvastatin with elevated transaminases levels ?-OP surveillance for AAA ? ?Hyperbilirubinemia disproportionate to elevated transaminase levels ?-RUQ [place] to  eval GB ?-liver US with doppler to r/o hepatic vein thrombus due to hypercoagulability with covid ? ?Anxiety ?-Con't celexa ?-con't PTA ativan PRN ? ?GERD ?-con't PPI, which is a home med ? ?Transaminitis  ?AST 1288, ALT 643, bilirubin 2.8, being seen  outpatient for issues with jaundice.  Acute on chronic with sepsis worsening currently. ?-Monitor ?-Supportive care as above ?-Check CK, hepatitis panel ? ?Hyperglycemia ?-SSI  PRN ?-goal BG 140-180 ?-check A1c  ? ?History of prostate cancer; s{ radiation, seed implant brachytherapy, androgen deprivation therapy ?-wife will bring his home supply of Zytiga; will need prednisone added back with this once his dexamethasone is completed ? ?Patient and wife updated during bedside rounds. Anticipate he can transfer out of ICU later today if his BP remains stable. ? ?Best Practice (right click and "Reselect all SmartList Selections" daily)  ? ?Diet/type: NPO w/ oral meds ?DVT prophylaxis: LMWH ?GI prophylaxis: PPI ?Lines: N/A ?Foley:  N/A ?Code Status:  full code ?Last date of multidisciplinary goals of care discussion [pending] ? ?Labs   ?CBC: ?Recent Labs  ?Lab 12/24/21 ?1103 12/24/21 ?1313 12/25/21 ?0129  ?WBC 4.7  --  8.3  ?NEUTROABS 4.0  --  7.9*  ?HGB 12.5* 11.6* 12.6*  ?HCT 38.1* 34.0* 38.9*  ?MCV 94.1  --  95.1  ?PLT 156  --  129*  ? ? ? ?Basic Metabolic Panel: ?Recent Labs  ?Lab 12/24/21 ?1103 12/24/21 ?1313 12/25/21 ?0129  ?NA 139 139 139  ?K 3.6 3.5 3.8  ?CL 102  --  107  ?CO2 25  --  25  ?GLUCOSE 132*  --  133*  ?BUN 25*  --  17  ?CREATININE 1.14  --  1.05  ?CALCIUM 8.5*  --  8.4*  ?MG  --   --  1.9  ?PHOS  --   --  3.9  ? ? ?GFR: ?Estimated Creatinine Clearance: 67.7 mL/min (by C-G formula based on SCr of 1.05 mg/dL). ?Recent Labs  ?Lab 12/24/21 ?1103 12/24/21 ?1315 12/24/21 ?1631 12/24/21 ?1645 12/25/21 ?0129  ?PROCALCITON  --   --  20.58  --   --   ?WBC 4.7  --   --   --  8.3  ?LATICACIDVEN 3.6* 6.5*  --  2.2* 1.0  ? ? ? ?Liver Function Tests: ?Recent Labs  ?Lab 12/24/21 ?1103 12/25/21 ?0129  ?AST 1,288* 618*  ?ALT 643* 486*  ?ALKPHOS 98 101  ?BILITOT 2.8* 6.1*  ?PROT 5.9* 5.8*  ?ALBUMIN 3.7 3.0*  ? ? ? ? ?Julian Hy, DO 12/25/21 10:38 AM ?[facility] ? ? ?

## 2021-12-25 NOTE — Progress Notes (Signed)
PHARMACY - PHYSICIAN COMMUNICATION ?CRITICAL VALUE ALERT - BLOOD CULTURE IDENTIFICATION (BCID) ? ?Tony [name] is an 81 y.o. male who presented to [facility] on 12/24/2021 with a chief complaint of sepsis ? ?Assessment:  e. Coli in 3 of 4 samples; provider comfortable stopping Vanc and letting day shift narrowing gram negative coverage ? ?Name of physician (or Provider) Contacted: Oletta Darter ? ?Current antibiotics: Vancomycin and Cefepime ? ?Changes to prescribed antibiotics recommended:  ?Recommendations accepted by provider ? ?Results for orders placed or performed during the hospital encounter of 12/24/21  ?Blood Culture ID Panel (Reflexed) (Collected: 12/24/2021 11:03 AM)  ?Result Value Ref Range  ? Enterococcus faecalis NOT DETECTED NOT DETECTED  ? Enterococcus Faecium NOT DETECTED NOT DETECTED  ? Listeria monocytogenes NOT DETECTED NOT DETECTED  ? Staphylococcus species NOT DETECTED NOT DETECTED  ? Staphylococcus aureus (BCID) NOT DETECTED NOT DETECTED  ? Staphylococcus epidermidis NOT DETECTED NOT DETECTED  ? Staphylococcus lugdunensis NOT DETECTED NOT DETECTED  ? Streptococcus species NOT DETECTED NOT DETECTED  ? Streptococcus agalactiae NOT DETECTED NOT DETECTED  ? Streptococcus pneumoniae NOT DETECTED NOT DETECTED  ? Streptococcus pyogenes NOT DETECTED NOT DETECTED  ? A.calcoaceticus-baumannii NOT DETECTED NOT DETECTED  ? Bacteroides fragilis NOT DETECTED NOT DETECTED  ? Enterobacterales DETECTED (A) NOT DETECTED  ? Enterobacter cloacae complex NOT DETECTED NOT DETECTED  ? Escherichia coli DETECTED (A) NOT DETECTED  ? Klebsiella aerogenes NOT DETECTED NOT DETECTED  ? Klebsiella oxytoca NOT DETECTED NOT DETECTED  ? Klebsiella pneumoniae NOT DETECTED NOT DETECTED  ? Proteus species NOT DETECTED NOT DETECTED  ? Salmonella species NOT DETECTED NOT DETECTED  ? Serratia marcescens NOT DETECTED NOT DETECTED  ? Haemophilus influenzae NOT DETECTED NOT DETECTED  ? Neisseria meningitidis NOT DETECTED NOT DETECTED   ? Pseudomonas aeruginosa NOT DETECTED NOT DETECTED  ? Stenotrophomonas maltophilia NOT DETECTED NOT DETECTED  ? Candida albicans NOT DETECTED NOT DETECTED  ? Candida auris NOT DETECTED NOT DETECTED  ? Candida glabrata NOT DETECTED NOT DETECTED  ? Candida krusei NOT DETECTED NOT DETECTED  ? Candida parapsilosis NOT DETECTED NOT DETECTED  ? Candida tropicalis NOT DETECTED NOT DETECTED  ? Cryptococcus neoformans/gattii NOT DETECTED NOT DETECTED  ? CTX-M ESBL NOT DETECTED NOT DETECTED  ? Carbapenem resistance IMP NOT DETECTED NOT DETECTED  ? Carbapenem resistance KPC NOT DETECTED NOT DETECTED  ? Carbapenem resistance NDM NOT DETECTED NOT DETECTED  ? Carbapenem resist OXA 48 LIKE NOT DETECTED NOT DETECTED  ? Carbapenem resistance VIM NOT DETECTED NOT DETECTED  ? ? ?Tony [name] ?12/25/2021  6:21 AM ? ?

## 2021-12-25 NOTE — Consult Note (Signed)
? ?  [facility] CM Inpatient Consult ? ? ?12/25/2021 ? ?Lianne Moris Born ?1941-01-02 ?[id number] ? ?[facility] Organization [ACO] Patient: [employer] ? ?Primary Care Provider:  [name], [name], MD with [facility], is an embedded provider with a Chronic Care Management team and program, and is listed for the transition of care follow up and appointments. ? ?Patient was screened for high risk score for unplanned readmission risk and patient is actie in the Embedded CCM program noted with Pharmacist.  Patient is currently listed in ICU. ? ?Plan: Continue to follow for post hospital follow up needs. ? ? ?Please contact for further questions, ? ?Natividad Brood, RN BSN CCM ?[facility] Liaison ? [phone] business mobile phone ?Toll free office [phone]  ?Fax number: [phone] ?[email or web address] ? ? ? ?

## 2021-12-25 NOTE — Progress Notes (Signed)
Pharmacy Antibiotic Note ? ?Tony [name] is a 81 y.o. male with concern on intra-abdominal infection. He is noted with Ecoli bacteremia and COVID postive.  Pharmacy has been consulted for zosyn dosing. ? ?Plan: ?-Zosyn 3.375gm IV q8h ?-Will follow renal function, cultures and clinical progress ? ? ?Height: '5\' 8"'$  (172.7 cm) ?Weight: 107.2 kg (236 lb 5.3 oz) ?IBW/kg (Calculated) : 68.4 ? ?Temp (24hrs), Avg:98.8 ?F (37.1 ?C), Min:97.8 ?F (36.6 ?C), Max:100.2 ?F (37.9 ?C) ? ?Recent Labs  ?Lab 12/24/21 ?1103 12/24/21 ?1315 12/24/21 ?1645 12/25/21 ?0129  ?WBC 4.7  --   --  8.3  ?CREATININE 1.14  --   --  1.05  ?LATICACIDVEN 3.6* 6.5* 2.2* 1.0  ?  ?Estimated Creatinine Clearance: 67.7 mL/min (by C-G formula based on SCr of 1.05 mg/dL).   ? ?Allergies  ?Allergen Reactions  ? Penicillins Rash  ?  Has patient had a PCN reaction causing immediate rash, facial/tongue/throat swelling, SOB or lightheadedness with hypotension: Yes ?Has patient had a PCN reaction causing severe rash involving mucus membranes or skin necrosis: No ?Has patient had a PCN reaction that required hospitalization: No ?Has patient had a PCN reaction occurring within the last 10 years: No ?If all of the above answers are "NO", then may proceed with Cephalosporin use.  ? ? ?Antimicrobials this admission: ?Zosyn 4/13>> ?Vanc 4/12>> 4/13 ?Cefepime 4/12>> 4/13 ? ? ?Dose adjustments this admission: ? ? ?Microbiology results: ?4/13 blood x4:  GNR; BCID e. Coli in 3/4 ? ?Thank you for allowing pharmacy to be a part of this patient?s care. ? ?Hildred Laser, PharmD ?Clinical Pharmacist ?**Pharmacist phone directory can now be found on [email or web address] ([email or web address]).  Listed under [place]. ? ? ? ?

## 2021-12-25 NOTE — Progress Notes (Signed)
[facility] ADULT ICU REPLACEMENT PROTOCOL ? ? ?The patient does apply for the [facility] Adult ICU Electrolyte Replacment Protocol based on the criteria listed below:  ? ?1.Exclusion criteria: TCTS patients, ECMO patients, and Dialysis patients ?2. Is GFR >/= 30 ml/min? Yes.    ?Patient's GFR today is >60 ?3. Is SCr </= 2? Yes.   ?Patient's SCr is 1.05 mg/dL ?4. Did SCr increase >/= 0.5 in 24 hours? No. ?5.Pt's weight >40kg  Yes.   ?6. Abnormal electrolyte(s):   Mg 1.9  ?7. Electrolytes replaced per protocol ?8.  Call MD STAT for K+ </= 2.5, Phos </= 1, or Mag </= 1 ?Physician:  S. Sommer ? ?Sedonia Small 12/25/2021 4:38 AM ? ?

## 2021-12-25 NOTE — Plan of Care (Signed)

## 2021-12-25 NOTE — Consult Note (Signed)
?   ? ? ? ? ?[place] for Infectious Disease   ? ?Date of Admission:  12/24/2021   Total days of inpatient antibiotics 1 ? ?      ?Reason for Consult: Ecoli bacteremia    ?Principal Problem: ?  Respiratory failure with hypoxia ([place]) ?Active Problems: ?  COVID ?  Severe sepsis ([place]) ?  Transaminitis ? ? ?Assessment: ?62 YM with COPD, OSA on CPAP qhs, prostate Ca with lymphadenopathy admitted for respiratory failure 2/2 COVID PNA. ? ?#E coli bacteremia suspected 2/2 GI etiology ?#Elevated LFTs and t billi ?#Prostate Ca on Leuprolide and Zitliga  ?Although elevated LFTs/ tbilli can be seen with COVID given he has gallbladder would suspect intraabdominal source. He has no urinary complaints.  ?Recommendations:  ?-Continue ceftriaxone and metronidazole ?-Follow-up abdominal U/S if unrevealing will get CT AP  in the setting of advanced prostate Ca with LAD  ?-Follow blood Cx ?-Urine Cx to look for possible source ? ?#Acute on chronic respiratory failure 2/2 COVID infection ?# CPAP qhs at baseline ?-Pt received booster this year ?-Symptoms started Tuesday night ?-Initially baricitinib(4/12) started but was stopped due to bacteremia(immunosuppression). LFTs above threshold to start remdesivir.  ?Recommendations ?-Continue dexamethasone ? ?Microbiology:   ?Antibiotics: ?Cefepime 4/12 ?Metronidazole 4/12-p ?Vancomycin 4/12 ?Ceftriaxone 4/13 ? ?Cultures: ?Blood ?4/12 p ? ? ? ?HPI: Tony [name] is a 81 y.o. 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 In the ED his O2 Sat at 79%, placed on BiPAP and found to be COVID  positive. CT showed diffuse emphysematous changes. Found to have elevated LFTs and tbilli, abdominal US pending. ID consulted as pt has Ecoli bacteremia.  ? ? ? ?Review of  Systems: ?Review of Systems  ?All other systems reviewed and are negative. ? ?Past Medical History:  ?Diagnosis Date  ? COPD (chronic obstructive pulmonary disease) ([place])   ? Gastric outlet obstruction 02/03/2018  ? [name] 02/03/2018  ? GERD (gastroesophageal reflux disease)   ? History of hiatal hernia   ? small/notes 02/03/2018  ? Hypertension   ? no meds  ? Presence of permanent cardiac pacemaker   ? Prostate cancer ([place]) 02/08/2014  ? Gleason 4+5=9, PSA 15.65  ? Radiation   ? Sinus problem   ? SSS (sick sinus syndrome) ([place])   ? ? ?Social History  ? ?Tobacco Use  ? Smoking status: Former  ?  Types: Cigars  ?  Quit date: 09/14/2012  ?  Years since quitting: 9.2  ? Smokeless tobacco: Never  ? Tobacco comments:  ?  little cigars; the small ones"  ?Vaping Use  ? Vaping Use: Never used  ?Substance Use Topics  ? Alcohol use: Not Currently  ?  Alcohol/week: 14.0 standard drinks  ?  Types: 14 Cans of beer per week  ? Drug use: No  ? ? ?Family History  ?Problem Relation Age of Onset  ? Alzheimer's disease Mother   ? Alzheimer's disease Father   ? Cancer Father   ?     prostate  ? Arthritis Sister   ? Cancer Brother   ?     prostate  ? ?Scheduled Meds: ? albuterol  2 puff Inhalation Q6H  ? vitamin C  500 mg Oral Daily  ? aspirin EC  81 mg Oral Daily  ? Chlorhexidine  Gluconate Cloth  6 each Topical Daily  ? citalopram  10 mg Oral Daily  ? [START ON 12/26/2021] dexamethasone (DECADRON) injection  6 mg Intravenous Q24H  ? docusate sodium  100 mg Oral Q supper  ? enoxaparin (LOVENOX) injection  40 mg Subcutaneous W43X  ? folic acid  1 mg Oral Daily  ? insulin aspart  0-9 Units Subcutaneous Q4H  ? mometasone-formoterol  2 puff Inhalation BID  ? multivitamin with minerals  1 tablet Oral Daily  ? pantoprazole  40 mg Oral Daily  ? potassium chloride  20 mEq Oral Once  ? sodium chloride flush  3 mL Intravenous Q12H  ? thiamine  100 mg Oral Daily  ? zinc sulfate  220 mg Oral Daily  ? ?Continuous Infusions: ? cefTRIAXone (ROCEPHIN)  IV 2  g (12/25/21 0947)  ? metronidazole 500 mg (12/25/21 0831)  ? ?PRN Meds:.acetaminophen, chlorpheniramine-HYDROcodone, guaiFENesin-dextromethorphan, LORazepam, ondansetron **OR** ondansetron (ZOFRAN) IV ?Allergies  ?Allergen Reactions  ? Penicillins Rash  ?  Has patient had a PCN reaction causing immediate rash, facial/tongue/throat swelling, SOB or lightheadedness with hypotension: Yes ?Has patient had a PCN reaction causing severe rash involving mucus membranes or skin necrosis: No ?Has patient had a PCN reaction that required hospitalization: No ?Has patient had a PCN reaction occurring within the last 10 years: No ?If all of the above answers are "NO", then may proceed with Cephalosporin use.  ? ? ?OBJECTIVE: ?Blood pressure 101/71, pulse 66, temperature 97.9 ?F (36.6 ?C), temperature source Oral, resp. rate (!) 22, height '5\' 8"'$  (1.727 m), weight 107.2 kg, SpO2 91 %. ? ?Physical Exam ?Constitutional:   ?   General: He is not in acute distress. ?   Appearance: He is normal weight. He is not toxic-appearing.  ?HENT:  ?   Head: Normocephalic and atraumatic.  ?   Right Ear: External ear normal.  ?   Left Ear: External ear normal.  ?   Nose: No congestion or rhinorrhea.  ?   Mouth/Throat:  ?   Mouth: Mucous membranes are moist.  ?   Pharynx: Oropharynx is clear.  ?Eyes:  ?   Extraocular Movements: Extraocular movements intact.  ?   Conjunctiva/sclera: Conjunctivae normal.  ?   Pupils: Pupils are equal, round, and reactive to light.  ?Cardiovascular:  ?   Rate and Rhythm: Normal rate and regular rhythm.  ?   Heart sounds: No murmur heard. ?  No friction rub. No gallop.  ?Pulmonary:  ?   Effort: Pulmonary effort is normal.  ?   Breath sounds: Normal breath sounds.  ?Abdominal:  ?   General: Abdomen is flat. Bowel sounds are normal.  ?   Palpations: Abdomen is soft.  ?Musculoskeletal:     ?   General: No swelling. Normal range of motion.  ?   Cervical back: Normal range of motion and neck supple.  ?Skin: ?   General: Skin  is warm and dry.  ?Neurological:  ?   General: No focal deficit present.  ?   Mental Status: He is oriented to person, place, and time.  ?Psychiatric:     ?   Mood and Affect: Mood normal.  ? ? ?Lab Results ?Lab Results  ?Component Value Date  ? WBC 8.3 12/25/2021  ? HGB 12.6 (L) 12/25/2021  ? HCT 38.9 (L) 12/25/2021  ? MCV 95.1 12/25/2021  ? PLT 129 (L) 12/25/2021  ?  ?Lab Results  ?Component Value Date  ? CREATININE 1.05 12/25/2021  ? BUN  17 12/25/2021  ? NA 139 12/25/2021  ? K 3.8 12/25/2021  ? CL 107 12/25/2021  ? CO2 25 12/25/2021  ?  ?Lab Results  ?Component Value Date  ? ALT 486 (H) 12/25/2021  ? AST 618 (H) 12/25/2021  ? ALKPHOS 101 12/25/2021  ? BILITOT 6.1 (H) 12/25/2021  ?  ? ? ? ?Laurice Record, MD ?[facility] for Infectious Disease ?[facility] ?12/25/2021, 10:46 AM  ?

## 2021-12-25 NOTE — Progress Notes (Signed)
?  Transition of Care (TOC) Screening Note ? ? ?Patient Details  ?Name: Tony [name] ?Date of Birth: 30-Sep-1940 ? ? ?Transition of Care (TOC) CM/SW Contact:    ?Dahlia Client, Romeo Rabon, RN ?Phone Number: ?12/25/2021, 10:31 AM ? ? ? ?Transition of Care Department [facility]) has reviewed patient and no TOC needs have been identified at this time. Note pt is COVID positive, from home. We will continue to monitor patient advancement through interdisciplinary progression rounds. If new patient transition needs arise, please place a TOC consult. ?  ?

## 2021-12-26 ENCOUNTER — Inpatient Hospital Stay (HOSPITAL_COMMUNITY): Payer: Medicare Other

## 2021-12-26 DIAGNOSIS — I10 Essential (primary) hypertension: Secondary | ICD-10-CM

## 2021-12-26 DIAGNOSIS — J9611 Chronic respiratory failure with hypoxia: Secondary | ICD-10-CM

## 2021-12-26 DIAGNOSIS — I2510 Atherosclerotic heart disease of native coronary artery without angina pectoris: Secondary | ICD-10-CM

## 2021-12-26 DIAGNOSIS — I959 Hypotension, unspecified: Secondary | ICD-10-CM

## 2021-12-26 DIAGNOSIS — F064 Anxiety disorder due to known physiological condition: Secondary | ICD-10-CM

## 2021-12-26 DIAGNOSIS — G4733 Obstructive sleep apnea (adult) (pediatric): Secondary | ICD-10-CM

## 2021-12-26 DIAGNOSIS — E7800 Pure hypercholesterolemia, unspecified: Secondary | ICD-10-CM

## 2021-12-26 DIAGNOSIS — R7401 Elevation of levels of liver transaminase levels: Secondary | ICD-10-CM

## 2021-12-26 DIAGNOSIS — J9601 Acute respiratory failure with hypoxia: Secondary | ICD-10-CM | POA: Diagnosis not present

## 2021-12-26 DIAGNOSIS — R7303 Prediabetes: Secondary | ICD-10-CM

## 2021-12-26 DIAGNOSIS — I453 Trifascicular block: Secondary | ICD-10-CM

## 2021-12-26 DIAGNOSIS — Z950 Presence of cardiac pacemaker: Secondary | ICD-10-CM

## 2021-12-26 DIAGNOSIS — J449 Chronic obstructive pulmonary disease, unspecified: Secondary | ICD-10-CM

## 2021-12-26 DIAGNOSIS — I714 Abdominal aortic aneurysm, without rupture, unspecified: Secondary | ICD-10-CM | POA: Diagnosis present

## 2021-12-26 DIAGNOSIS — C61 Malignant neoplasm of prostate: Secondary | ICD-10-CM

## 2021-12-26 DIAGNOSIS — F17200 Nicotine dependence, unspecified, uncomplicated: Secondary | ICD-10-CM

## 2021-12-26 DIAGNOSIS — E669 Obesity, unspecified: Secondary | ICD-10-CM

## 2021-12-26 DIAGNOSIS — I495 Sick sinus syndrome: Secondary | ICD-10-CM

## 2021-12-26 LAB — HEMOGLOBIN A1C
Hgb A1c MFr Bld: 5.9 % — ABNORMAL HIGH (ref 4.8–5.6)
Mean Plasma Glucose: 122.63 mg/dL

## 2021-12-26 LAB — CBC WITH DIFFERENTIAL/PLATELET
Abs Immature Granulocytes: 0.06 10*3/uL (ref 0.00–0.07)
Basophils Absolute: 0 10*3/uL (ref 0.0–0.1)
Basophils Relative: 0 %
Eosinophils Absolute: 0 10*3/uL (ref 0.0–0.5)
Eosinophils Relative: 0 %
HCT: 34.7 % — ABNORMAL LOW (ref 39.0–52.0)
Hemoglobin: 11.6 g/dL — ABNORMAL LOW (ref 13.0–17.0)
Immature Granulocytes: 1 %
Lymphocytes Relative: 5 %
Lymphs Abs: 0.4 10*3/uL — ABNORMAL LOW (ref 0.7–4.0)
MCH: 31.2 pg (ref 26.0–34.0)
MCHC: 33.4 g/dL (ref 30.0–36.0)
MCV: 93.3 fL (ref 80.0–100.0)
Monocytes Absolute: 0.5 10*3/uL (ref 0.1–1.0)
Monocytes Relative: 6 %
Neutro Abs: 7.5 10*3/uL (ref 1.7–7.7)
Neutrophils Relative %: 88 %
Platelets: 135 10*3/uL — ABNORMAL LOW (ref 150–400)
RBC: 3.72 MIL/uL — ABNORMAL LOW (ref 4.22–5.81)
RDW: 14 % (ref 11.5–15.5)
WBC: 8.5 10*3/uL (ref 4.0–10.5)
nRBC: 0 % (ref 0.0–0.2)

## 2021-12-26 LAB — GLUCOSE, CAPILLARY
Glucose-Capillary: 118 mg/dL — ABNORMAL HIGH (ref 70–99)
Glucose-Capillary: 123 mg/dL — ABNORMAL HIGH (ref 70–99)
Glucose-Capillary: 125 mg/dL — ABNORMAL HIGH (ref 70–99)
Glucose-Capillary: 135 mg/dL — ABNORMAL HIGH (ref 70–99)
Glucose-Capillary: 137 mg/dL — ABNORMAL HIGH (ref 70–99)
Glucose-Capillary: 140 mg/dL — ABNORMAL HIGH (ref 70–99)
Glucose-Capillary: 147 mg/dL — ABNORMAL HIGH (ref 70–99)

## 2021-12-26 LAB — D-DIMER, QUANTITATIVE: D-Dimer, Quant: 2.25 ug/mL-FEU — ABNORMAL HIGH (ref 0.00–0.50)

## 2021-12-26 LAB — COMPREHENSIVE METABOLIC PANEL
ALT: 320 U/L — ABNORMAL HIGH (ref 0–44)
AST: 263 U/L — ABNORMAL HIGH (ref 15–41)
Albumin: 2.9 g/dL — ABNORMAL LOW (ref 3.5–5.0)
Alkaline Phosphatase: 97 U/L (ref 38–126)
Anion gap: 8 (ref 5–15)
BUN: 28 mg/dL — ABNORMAL HIGH (ref 8–23)
CO2: 24 mmol/L (ref 22–32)
Calcium: 8.6 mg/dL — ABNORMAL LOW (ref 8.9–10.3)
Chloride: 103 mmol/L (ref 98–111)
Creatinine, Ser: 1.08 mg/dL (ref 0.61–1.24)
GFR, Estimated: >60 mL/min (ref >60)
Glucose, Bld: 135 mg/dL — ABNORMAL HIGH (ref 70–99)
Potassium: 4.2 mmol/L (ref 3.5–5.1)
Sodium: 135 mmol/L (ref 135–145)
Total Bilirubin: 1.3 mg/dL — ABNORMAL HIGH (ref 0.3–1.2)
Total Protein: 5.4 g/dL — ABNORMAL LOW (ref 6.5–8.1)

## 2021-12-26 LAB — LACTATE DEHYDROGENASE: LDH: 186 U/L (ref 98–192)

## 2021-12-26 LAB — C-REACTIVE PROTEIN: CRP: 7.7 mg/dL — ABNORMAL HIGH (ref <1.0)

## 2021-12-26 LAB — MAGNESIUM: Magnesium: 2.5 mg/dL — ABNORMAL HIGH (ref 1.7–2.4)

## 2021-12-26 LAB — FERRITIN: Ferritin: 112 ng/mL (ref 24–336)

## 2021-12-26 LAB — PHOSPHORUS: Phosphorus: 2.6 mg/dL (ref 2.5–4.6)

## 2021-12-26 MED ORDER — IOHEXOL 9 MG/ML PO SOLN
ORAL | Status: AC
  Filled 2021-12-26: qty 1000

## 2021-12-26 MED ORDER — IOHEXOL 300 MG/ML  SOLN
100.0000 mL | Freq: Once | INTRAMUSCULAR | Status: AC | PRN
  Administered 2021-12-26: 100 mL via INTRAVENOUS

## 2021-12-26 NOTE — Progress Notes (Signed)
?PROGRESS NOTE ? ? ? Tony [name]  OMB:[id number] DOB: 11/06/1940 DOA: 12/24/2021 ?PCP: [name], [name], MD  ? ?Brief Narrative:  ?81 y.o. WM PMHx anxiety DO, thoracic aortic aneurysm, prostate cancer, tobacco abuse, essential HTN, trifascicular block, CAD, sick sinus syndrome, s/p pacemaker, chronic respiratory failure with hypoxia, COPD mixed type, OSA ? ?Presented to [facility] with a chief complaint of shortness of breath and generalized weakness ?  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  ?He developed shortness of breath and generalized weakness overnight on 4/11 - 4/12.  Denies nausea, vomiting, diarrhea ?  ?ED course was notable for BP 84/58, initial oxygen saturation of 79%, and HR 135.  A code sepsis was initiated.  2 L of IV fluid and blood cultures were ordered, broad-spectrum antibiotics started, patient placed on BiPAP.  The patient was found to be COVID-positive.   ? ? ?Subjective: ?3/14 A/O x4, patient sitting comfortably in bed states he is improved from admission.  Still on nasal cannula O2 ? ? ?Assessment & Plan: ? Covid vaccination; ? ?Principal Problem: ?  Respiratory failure with hypoxia ([place]) ?Active Problems: ?  COVID ?  Severe sepsis ([place]) ?  Transaminitis ? ?COVID-19 Labs ? ?Recent Labs  ?  12/26/21 ?0842  ?DDIMER 2.25*  ?LDH 186  ? ? ?Lab Results  ?Component Value Date  ? SARSCOV2NAA POSITIVE (A) 12/24/2021  ? [place] NEGATIVE 09/27/2021  ? [place] NEGATIVE 09/19/2021  ? [place] NEGATIVE 07/13/2021  ? [place] NEGATIVE 07/13/2021  ?  ? ?Acute respiratory failure due to COVID 19 viral pneumonia ?-No evidence of PE ?-stop baricitinib with bacteremia ?-con't steroids-- decreased to dexamethasone ?-con't supplemental O2 as required to maintain SpO2 >88% ?-CPAP QHS ?-albuterol PRN, dulera BID ?-Decadron 6 mg daily ? ?COPD, not  acutely exacerbated ? ? ?OSA ?-BiPAP per respiratory settings ? ?Sepsis positive E. coli bacteremia; concerned for biliary source of infection ?-deescalate antibiotics to metronidazole & cefepime ?-has not required vasopressors; would initiate if MAP <65 ?-RUQ [place] to evaluate for cholecystitis ? ?Abdominal aortic aneurysm ?- 4/14 CT abdomen:4.2 cm infrarenal abdominal aortic aneurysm ?- 4/14 we will consult CT Surgery in [place] to ensure patient has follow-up.   ? ? ?Lactic acidosis, resolved ?  ?HTN, HLD ?HX Sick sinus syndrome and trifascicular block s/p pacemaker placement ?HX infrarenal AAA ?Last ECHO 10/22 EF 60-65% ?-telemetry monitoring ?-con't daily aspirin ?-holding PTA antihypertensives, lasix ?-holding PTA atorvastatin with elevated transaminases levels ?-OP surveillance for AAA ?  ?Hyperbilirubinemia disproportionate to elevated transaminase levels ?-RUQ [place] to eval GB ?-liver US with doppler to r/o hepatic vein thrombus due to hypercoagulability with covid ?  ?Anxiety ?-Con't celexa ?-con't PTA ativan PRN ?  ?GERD ?-con't PPI, which is a home med ?  ?Transaminitis  ?AST 1288, ALT 643, bilirubin 2.8, being seen outpatient for issues with jaundice.  Acute on chronic with sepsis worsening currently. ?-Monitor ?-Supportive care as above ?-Check CK, hepatitis panel ?  ?Hyperglycemia ?-SSI  PRN ?-goal BG 140-180 ?-check A1c  ?  ?History of prostate cancer; s{ radiation, seed implant brachytherapy, androgen deprivation therapy ?-wife will bring his home supply of Zytiga; will need prednisone added back with this once his dexamethasone is completed ?  ?Patient and wife updated during bedside rounds. Anticipate he can transfer out of ICU later today if his BP remains stable. ?Body mass index is 35.63 kg/m?. ? ? ?  Mobility Assessment (last 72 hours)   ? ? Mobility Assessment   ?No documentation. ? ?  ?  ? ?  ? ? ?DVT prophylaxis:  ?Code Status:  ?Family Communication:  ?Status is: Inpatient ? ? ? ?Dispo: The patient  is from: Home ?             Anticipated d/c is to: Home ?             Anticipated d/c date is: > 3 days ?             Patient currently is not medically stable to d/c. ? ? ? ? ? ?Consultants:  ?PCCM ?ID ? ?Procedures/Significant Events:  ?4/12 presented to [facility], PCCM consult, COVID+, BC>, UA>, started on heparin empirically due to concern for PE, started on cefepime, vanc, flagyl ?4/13 US abdomen Limited RUQ ?Small dependent stones versus sludge in the gallbladder. But no ?evidence of acute cholecystitis or bile duct obstruction. ?2. Negative ultrasound appearance of the liver. ?4/13 US liver Doppler ?Suboptimal evaluation, with poor acoustic penetration secondary to ?patient habitus. ?1. Normal liver Doppler evaluation ?2. Incidental senescent renal change, with diffuse RIGHT renal ?cortical thinning. ?4/14 CT abdomen pelvis W contrast ?Dependent atelectasis/consolidation in the visualized lung bases ?with trace effusions. ?2. Coronary and Aortic Atherosclerosis (ICD10-170.0). ?3. Cholelithiasis without CT evidence of cholecystitis or biliary ?obstruction. ?4. 4.2 cm infrarenal abdominal aortic aneurysm. Recommend follow-up ?every 12 months and vascular consultation ? ? ?I have personally reviewed and interpreted all radiology studies and my findings are as above. ? ?VENTILATOR SETTINGS: ?Nasal cannula 4/14 ?SPO2 94% ? ? ?Cultures ?4/12 RIGHT AC positive E. coli ?4/12 RIGHT wrist positive E. coli ? ?Antimicrobials: ?Anti-infectives (From admission, onward)  ? ? Start     Dose/Rate Route Frequency Ordered Stop  ? 12/25/21 1600  piperacillin-tazobactam (ZOSYN) IVPB 3.375 g  Status:  Discontinued       ? 3.375 g ?12.5 mL/hr over 240 Minutes Intravenous Every 8 hours 12/25/21 0758 12/25/21 0808  ? 12/25/21 1200  vancomycin (VANCOREADY) IVPB 1250 mg/250 mL  Status:  Discontinued       ? 1,250 mg ?166.7 mL/hr over 90 Minutes Intravenous Every 24 hours 12/24/21 1317 12/25/21 0630  ? 12/25/21 0900  cefTRIAXone (ROCEPHIN) 2  g in sodium chloride 0.9 % 100 mL IVPB       ? 2 g ?200 mL/hr over 30 Minutes Intravenous Every 24 hours 12/25/21 0808    ? 12/25/21 0900  metroNIDAZOLE (FLAGYL) IVPB 500 mg       ? 500 mg ?100 mL/hr over 60 Minutes Intravenous Every 12 hours 12/25/21 0808    ? 12/25/21 0845  piperacillin-tazobactam (ZOSYN) IVPB 3.375 g  Status:  Discontinued       ? 3.375 g ?100 mL/hr over 30 Minutes Intravenous  Once 12/25/21 0758 12/25/21 0808  ? 12/24/21 1930  ceFEPIme (MAXIPIME) 2 g in sodium chloride 0.9 % 100 mL IVPB  Status:  Discontinued       ? 2 g ?200 mL/hr over 30 Minutes Intravenous Every 8 hours 12/24/21 1317 12/25/21 0735  ? 12/24/21 1230  vancomycin (VANCOCIN) IVPB 1000 mg/200 mL premix       ? 1,000 mg ?200 mL/hr over 60 Minutes Intravenous  Once 12/24/21 1120 12/24/21 1428  ? 12/24/21 1130  aztreonam (AZACTAM) 2 g in sodium chloride 0.9 % 100 mL IVPB  Status:  Discontinued       ? 2 g ?200 mL/hr over 30 Minutes Intravenous  Once 12/24/21 1115 12/24/21 1119  ? 12/24/21 1130  metroNIDAZOLE (FLAGYL) IVPB 500 mg       ? 500 mg ?100 mL/hr over 60 Minutes Intravenous  Once 12/24/21 1115 12/24/21 1302  ? 12/24/21 1130  vancomycin (VANCOCIN) IVPB 1000 mg/200 mL premix       ? 1,000 mg ?200 mL/hr over 60 Minutes Intravenous  Once 12/24/21 1115 12/24/21 1234  ? 12/24/21 1130  ceFEPIme (MAXIPIME) 2 g in sodium chloride 0.9 % 100 mL IVPB       ? 2 g ?200 mL/hr over 30 Minutes Intravenous  Once 12/24/21 1119 12/24/21 1159  ? ?  ?  ? ? ?Devices ?  ? ?LINES / TUBES:  ? ? ? ? ?Continuous Infusions: ? cefTRIAXone (ROCEPHIN)  IV Stopped (12/25/21 1020)  ? metronidazole Stopped (12/25/21 2102)  ? ? ? ?Objective: ?Vitals:  ? 12/26/21 0500 12/26/21 0600 12/26/21 0700 12/26/21 0803  ?BP: (!) 141/91 (!) 141/96 (!) 138/97   ?Pulse: 81 65 80   ?Resp: (!) '21 15 20   '$ ?Temp:    98.6 ?F (37 ?C)  ?TempSrc:    Oral  ?SpO2: 93% 91% 92%   ?Weight: 106.3 kg     ?Height:      ? ? ?Intake/Output Summary (Last 24 hours) at 12/26/2021 0814 ?Last data  filed at 12/25/2021 2300 ?Gross per 24 hour  ?Intake 548.33 ml  ?Output 1000 ml  ?Net -451.67 ml  ? ?Filed Weights  ? 12/24/21 1055 12/25/21 0500 12/26/21 0500  ?Weight: 101.2 kg 107.2 kg 106.3 kg  ? ? ?Ex

## 2021-12-26 NOTE — Progress Notes (Addendum)
Brief ID note: ? ?Right upper quadrant ultrasound noted small dependent stones versus sludge in the gallbladder but no evidence of acute cholecystitis or bile duct obstruction.  Liver Doppler also normal.  CT abdomen and pelvis with contrast this morning was additionally negative for biliary duct dilation or new liver lesions.  Intra-abdominal source not identified. ? ?Patient remains on supplemental oxygen via 2 L of nasal cannula this morning.  His LFTs have improved significantly with marked improvement of his T. bili as well.  Urine culture is pending and blood culture susceptibilities for E. coli also pending.  He has been evaluated by Dr Watt Climes ouptatient due to jaundice and MRCP is scheduled for next week.  ? ?Recommendations: ?Continue ceftriaxone and metronidazole ?Await susceptibilities ?Consider completing MRCP now and engaging with GI as presentation appears c/w GI/biliary source ?COVID treatment per primary team.  Isolation per infection prevention ?Dr. Candiss Norse back this weekend and on Monday ? ? ? ? ? ?Mignon Pine ?[facility] Infectious Disease ?[facility] ?12/26/2021, 12:19 PM ? ?

## 2021-12-26 NOTE — Plan of Care (Signed)

## 2021-12-27 ENCOUNTER — Encounter (HOSPITAL_COMMUNITY): Payer: Self-pay | Admitting: Internal Medicine

## 2021-12-27 DIAGNOSIS — I714 Abdominal aortic aneurysm, without rupture, unspecified: Secondary | ICD-10-CM | POA: Diagnosis not present

## 2021-12-27 DIAGNOSIS — F064 Anxiety disorder due to known physiological condition: Secondary | ICD-10-CM | POA: Diagnosis not present

## 2021-12-27 DIAGNOSIS — J9611 Chronic respiratory failure with hypoxia: Secondary | ICD-10-CM | POA: Diagnosis not present

## 2021-12-27 DIAGNOSIS — J9601 Acute respiratory failure with hypoxia: Secondary | ICD-10-CM | POA: Diagnosis not present

## 2021-12-27 LAB — CULTURE, BLOOD (ROUTINE X 2)
Special Requests: ADEQUATE
Special Requests: ADEQUATE

## 2021-12-27 LAB — CBC WITH DIFFERENTIAL/PLATELET
Abs Immature Granulocytes: 0.06 10*3/uL (ref 0.00–0.07)
Basophils Absolute: 0 10*3/uL (ref 0.0–0.1)
Basophils Relative: 0 %
Eosinophils Absolute: 0 10*3/uL (ref 0.0–0.5)
Eosinophils Relative: 0 %
HCT: 36.7 % — ABNORMAL LOW (ref 39.0–52.0)
Hemoglobin: 12.3 g/dL — ABNORMAL LOW (ref 13.0–17.0)
Immature Granulocytes: 1 %
Lymphocytes Relative: 4 %
Lymphs Abs: 0.4 10*3/uL — ABNORMAL LOW (ref 0.7–4.0)
MCH: 31.2 pg (ref 26.0–34.0)
MCHC: 33.5 g/dL (ref 30.0–36.0)
MCV: 93.1 fL (ref 80.0–100.0)
Monocytes Absolute: 0.5 10*3/uL (ref 0.1–1.0)
Monocytes Relative: 5 %
Neutro Abs: 7.5 10*3/uL (ref 1.7–7.7)
Neutrophils Relative %: 90 %
Platelets: 143 10*3/uL — ABNORMAL LOW (ref 150–400)
RBC: 3.94 MIL/uL — ABNORMAL LOW (ref 4.22–5.81)
RDW: 13.8 % (ref 11.5–15.5)
WBC: 8.3 10*3/uL (ref 4.0–10.5)
nRBC: 0 % (ref 0.0–0.2)

## 2021-12-27 LAB — COMPREHENSIVE METABOLIC PANEL
ALT: 216 U/L — ABNORMAL HIGH (ref 0–44)
AST: 85 U/L — ABNORMAL HIGH (ref 15–41)
Albumin: 3.1 g/dL — ABNORMAL LOW (ref 3.5–5.0)
Alkaline Phosphatase: 88 U/L (ref 38–126)
Anion gap: 8 (ref 5–15)
BUN: 23 mg/dL (ref 8–23)
CO2: 23 mmol/L (ref 22–32)
Calcium: 9.1 mg/dL (ref 8.9–10.3)
Chloride: 109 mmol/L (ref 98–111)
Creatinine, Ser: 0.83 mg/dL (ref 0.61–1.24)
GFR, Estimated: >60 mL/min (ref >60)
Glucose, Bld: 103 mg/dL — ABNORMAL HIGH (ref 70–99)
Potassium: 4.2 mmol/L (ref 3.5–5.1)
Sodium: 140 mmol/L (ref 135–145)
Total Bilirubin: 1.2 mg/dL (ref 0.3–1.2)
Total Protein: 6.1 g/dL — ABNORMAL LOW (ref 6.5–8.1)

## 2021-12-27 LAB — MAGNESIUM: Magnesium: 2.1 mg/dL (ref 1.7–2.4)

## 2021-12-27 LAB — C-REACTIVE PROTEIN: CRP: 2.8 mg/dL — ABNORMAL HIGH (ref <1.0)

## 2021-12-27 LAB — GLUCOSE, CAPILLARY
Glucose-Capillary: 118 mg/dL — ABNORMAL HIGH (ref 70–99)
Glucose-Capillary: 121 mg/dL — ABNORMAL HIGH (ref 70–99)
Glucose-Capillary: 97 mg/dL (ref 70–99)
Glucose-Capillary: 120 mg/dL — ABNORMAL HIGH (ref 70–99)
Glucose-Capillary: 128 mg/dL — ABNORMAL HIGH (ref 70–99)

## 2021-12-27 LAB — D-DIMER, QUANTITATIVE: D-Dimer, Quant: 1.07 ug/mL-FEU — ABNORMAL HIGH (ref 0.00–0.50)

## 2021-12-27 LAB — FERRITIN: Ferritin: 84 ng/mL (ref 24–336)

## 2021-12-27 LAB — PHOSPHORUS: Phosphorus: 2.9 mg/dL (ref 2.5–4.6)

## 2021-12-27 LAB — MRSA NEXT GEN BY PCR, NASAL: MRSA by PCR Next Gen: NOT DETECTED

## 2021-12-27 LAB — URINE CULTURE: Culture: NO GROWTH

## 2021-12-27 LAB — SEDIMENTATION RATE: Sed Rate: 14 mm/hr (ref 0–16)

## 2021-12-27 LAB — LACTATE DEHYDROGENASE: LDH: 190 U/L (ref 98–192)

## 2021-12-27 NOTE — Progress Notes (Signed)
?PROGRESS NOTE ? ? ? Tony [name]  BZJ:[id number] DOB: 1941-03-23 DOA: 12/24/2021 ?PCP: [name], [name], MD  ? ?Brief Narrative:  ?81 y.o. WM PMHx anxiety DO, thoracic aortic aneurysm, prostate cancer, tobacco abuse, essential HTN, trifascicular block, CAD, sick sinus syndrome, s/p pacemaker, chronic respiratory failure with hypoxia, COPD mixed type, OSA ? ?Presented to [facility] with a chief complaint of shortness of breath and generalized weakness ?  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  ?He developed shortness of breath and generalized weakness overnight on 4/11 - 4/12.  Denies nausea, vomiting, diarrhea ?  ?ED course was notable for BP 84/58, initial oxygen saturation of 79%, and HR 135.  A code sepsis was initiated.  2 L of IV fluid and blood cultures were ordered, broad-spectrum antibiotics started, patient placed on BiPAP.  The patient was found to be COVID-positive.   ? ? ?Subjective: ?A/O x4.  Sitting comfortably in bed talking with his daughter.   ? ? ?Assessment & Plan: ? Covid vaccination; ? ?Principal Problem: ?  Respiratory failure with hypoxia ([place]) ?Active Problems: ?  Aneurysm of thoracic aorta (HCC) ?  COPD mixed type ([place]) ?  Malignant neoplasm of prostate ([place]) ?  Anxiety disorder ?  Tobacco use disorder ?  Hypercholesterolemia ?  Essential hypertension, benign ?  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[place]) ?  Chronic fatigue ?  Trifascicular block ?  Coronary artery disease involving native coronary artery of native heart without angina pectoris ?  Sick sinus syndrome ([place]) ?  Pacemaker ?  Obesity with body mass index (BMI) of 30.0 to 39.9 ?  Prediabetes ?  OSA (obstructive sleep apnea) ?  Chronic respiratory failure with hypoxia (HCC) ?  COVID ?  Severe sepsis ([place]) ?  Transaminitis ?  Abdominal aortic aneurysm (AAA) 3.0 cm to 5.5  cm in diameter in male [facility]) ? ?COVID-19 Labs ? ?Recent Labs  ?  12/26/21 ?0175 12/27/21 ?1052  ?DDIMER 2.25* 1.07*  ?FERRITIN 112 84  ?LDH 186 190  ?CRP 7.7* 2.8*  ? ? ?Lab Results  ?Component Value Date  ? SARSCOV2NAA POSITIVE (A) 12/24/2021  ? [place] NEGATIVE 09/27/2021  ? [place] NEGATIVE 09/19/2021  ? [place] NEGATIVE 07/13/2021  ? [place] NEGATIVE 07/13/2021  ?   ? ?Acute respiratory failure due to COVID 19 viral pneumonia ?-No evidence of PE ?-stop Baricitinib with Bacteremia and elevated liver enzymes ?-con't steroids-- decreased to dexamethasone ?-con't supplemental O2 as required to maintain SpO2 >88% ?-CPAP QHS ?-albuterol PRN, dulera BID ?-Decadron 6 mg daily ? ?COPD, not acutely exacerbated ? ? ?OSA ?-BiPAP per respiratory settings ? ?Sepsis positive E. coli bacteremia; concerned for biliary source of infection ?-deescalate antibiotics to metronidazole & cefepime ?-has not required vasopressors; would initiate if MAP <65 ?-RUQ [place] to evaluate for cholecystitis ? ?Abdominal aortic aneurysm ?- 4/14 CT abdomen:4.2 cm infrarenal abdominal aortic aneurysm ?-4/15 patient states he has a CT surgeon who follows his AAA. ? ? ?Lactic acidosis, resolved ?  ?HTN, HLD ?HX Sick sinus syndrome and trifascicular block s/p pacemaker placement ?HX infrarenal AAA ?Last ECHO 10/22 EF 60-65% ?-telemetry monitoring ?-con't daily aspirin ?-holding PTA antihypertensives, lasix ?-holding PTA atorvastatin with elevated transaminases levels ?-OP surveillance for AAA ?  ?Hyperbilirubinemia disproportionate to elevated transaminase levels ?-RUQ [place] to eval GB ?-liver US with doppler to r/o hepatic vein thrombus due to hypercoagulability with covid ?  ?  Anxiety ?-Con't celexa ?-con't PTA ativan PRN ?  ?GERD ?-con't PPI, which is a home med ? ?Cholelithiasis ?- 4/15 negative obstruction see CT findings below ?  ?Transaminitis  ?AST 1288, ALT 643, bilirubin 2.8, being seen outpatient for issues with jaundice.  Acute on  chronic with sepsis worsening currently. ?-Monitor ?-Supportive care as above ?-Check CK, hepatitis panel ?  ?Hyperglycemia ?-SSI  PRN ?-goal BG 140-180 ?-check A1c  ?  ?History of prostate cancer; s{ radiation, seed implant brachytherapy, androgen deprivation therapy ?-wife will bring his home supply of Zytiga; will need prednisone added back with this once his dexamethasone is completed ?  ? ?Obese (BMI 35.63 kg/m?.) ?- ? ? ? ?Mobility Assessment (last 72 hours)   ? ? Mobility Assessment   ?No documentation. ? ?  ?  ? ?  ? ? ?DVT prophylaxis: Lovenox ?Code Status: Full ?Family Communication: 4/15 discussed with daughter and wife plan of care all questions answered ?Status is: Inpatient ? ? ? ?Dispo: The patient is from: Home ?             Anticipated d/c is to: Home ?             Anticipated d/c date is: > 3 days ?             Patient currently is not medically stable to d/c. ? ? ? ? ? ?Consultants:  ?PCCM ?ID ? ?Procedures/Significant Events:  ?4/12 presented to [facility], PCCM consult, COVID+, BC>, UA>, started on heparin empirically due to concern for PE, started on cefepime, vanc, flagyl ?4/13 US abdomen Limited RUQ ?Small dependent stones versus sludge in the gallbladder. But no ?evidence of acute cholecystitis or bile duct obstruction. ?2. Negative ultrasound appearance of the liver. ?4/13 US liver Doppler ?Suboptimal evaluation, with poor acoustic penetration secondary to ?patient habitus. ?1. Normal liver Doppler evaluation ?2. Incidental senescent renal change, with diffuse RIGHT renal ?cortical thinning. ?4/14 CT abdomen pelvis W contrast ?Dependent atelectasis/consolidation in the visualized lung bases ?with trace effusions. ?2. Coronary and Aortic Atherosclerosis (ICD10-170.0). ?3. Cholelithiasis without CT evidence of cholecystitis or biliary ?obstruction. ?4. 4.2 cm infrarenal abdominal aortic aneurysm. Recommend follow-up ?every 12 months and vascular consultation ? ? ?I have personally reviewed and  interpreted all radiology studies and my findings are as above. ? ?VENTILATOR SETTINGS: ?Nasal cannula 4/15 ?Flow 2 L/min ?SPO2 94% ? ? ?Cultures ?4/12 RIGHT AC positive E. coli ?4/12 RIGHT wrist positive E. coli ? ? ?Antimicrobials: ?Anti-infectives (From admission, onward)  ? ? Start     Ordered Stop  ? 12/25/21 1600  piperacillin-tazobactam (ZOSYN) IVPB 3.375 g  Status:  Discontinued       ? 12/25/21 0758 12/25/21 0808  ? 12/25/21 1200  vancomycin (VANCOREADY) IVPB 1250 mg/250 mL  Status:  Discontinued       ? 12/24/21 1317 12/25/21 0630  ? 12/25/21 0900  cefTRIAXone (ROCEPHIN) 2 g in sodium chloride 0.9 % 100 mL IVPB       ? 12/25/21 0808    ? 12/25/21 0900  metroNIDAZOLE (FLAGYL) IVPB 500 mg       ? 12/25/21 0808    ? 12/25/21 0845  piperacillin-tazobactam (ZOSYN) IVPB 3.375 g  Status:  Discontinued       ? 12/25/21 0758 12/25/21 0808  ? 12/24/21 1930  ceFEPIme (MAXIPIME) 2 g in sodium chloride 0.9 % 100 mL IVPB  Status:  Discontinued       ? 12/24/21 1317 12/25/21 0735  ? 12/24/21 1230  vancomycin (VANCOCIN) IVPB 1000 mg/200 mL premix       ? 12/24/21 1120 12/24/21 1428  ? 12/24/21 1130  aztreonam (AZACTAM) 2 g in sodium chloride 0.9 % 100 mL IVPB  Status:  Discontinued       ? 12/24/21 1115 12/24/21 1119  ? 12/24/21 1130  metroNIDAZOLE (FLAGYL) IVPB 500 mg       ? 12/24/21 1115 12/24/21 1302  ? 12/24/21 1130  vancomycin (VANCOCIN) IVPB 1000 mg/200 mL premix       ? 12/24/21 1115 12/24/21 1234  ? 12/24/21 1130  ceFEPIme (MAXIPIME) 2 g in sodium chloride 0.9 % 100 mL IVPB       ? 12/24/21 1119 12/24/21 1159  ? ?  ?  ? ? ?Devices ?  ? ?LINES / TUBES:  ? ? ? ? ?Continuous Infusions: ? cefTRIAXone (ROCEPHIN)  IV Stopped (12/27/21 2505)  ? metronidazole 100 mL/hr at 12/27/21 1000  ? ? ? ?Objective: ?Vitals:  ? 12/27/21 0810 12/27/21 0900 12/27/21 0929 12/27/21 1000  ?BP:   (!) 150/89   ?Pulse:  70  61  ?Resp:  16  17  ?Temp: 97.7 ?F (36.5 ?C)     ?TempSrc: Oral     ?SpO2:  97%  95%  ?Weight:      ?Height:       ? ? ?Intake/Output Summary (Last 24 hours) at 12/27/2021 1033 ?Last data filed at 12/27/2021 1000 ?Gross per 24 hour  ?Intake 1452.12 ml  ?Output 2400 ml  ?Net -947.88 ml  ? ? ?Filed Weights  ? 12/25/21 0500 04/

## 2021-12-27 NOTE — Progress Notes (Signed)
Pt refused BIPAP stating it made too much noise. ?

## 2021-12-28 DIAGNOSIS — J9601 Acute respiratory failure with hypoxia: Secondary | ICD-10-CM | POA: Diagnosis not present

## 2021-12-28 DIAGNOSIS — J9611 Chronic respiratory failure with hypoxia: Secondary | ICD-10-CM | POA: Diagnosis not present

## 2021-12-28 DIAGNOSIS — I714 Abdominal aortic aneurysm, without rupture, unspecified: Secondary | ICD-10-CM | POA: Diagnosis not present

## 2021-12-28 DIAGNOSIS — F064 Anxiety disorder due to known physiological condition: Secondary | ICD-10-CM | POA: Diagnosis not present

## 2021-12-28 LAB — PHOSPHORUS: Phosphorus: 3.5 mg/dL (ref 2.5–4.6)

## 2021-12-28 LAB — CBC WITH DIFFERENTIAL/PLATELET
Abs Immature Granulocytes: 0.16 10*3/uL — ABNORMAL HIGH (ref 0.00–0.07)
Basophils Absolute: 0 10*3/uL (ref 0.0–0.1)
Basophils Relative: 0 %
Eosinophils Absolute: 0 10*3/uL (ref 0.0–0.5)
Eosinophils Relative: 0 %
HCT: 37.7 % — ABNORMAL LOW (ref 39.0–52.0)
Hemoglobin: 12.5 g/dL — ABNORMAL LOW (ref 13.0–17.0)
Immature Granulocytes: 2 %
Lymphocytes Relative: 7 %
Lymphs Abs: 0.5 10*3/uL — ABNORMAL LOW (ref 0.7–4.0)
MCH: 30.6 pg (ref 26.0–34.0)
MCHC: 33.2 g/dL (ref 30.0–36.0)
MCV: 92.2 fL (ref 80.0–100.0)
Monocytes Absolute: 0.5 10*3/uL (ref 0.1–1.0)
Monocytes Relative: 7 %
Neutro Abs: 5.5 10*3/uL (ref 1.7–7.7)
Neutrophils Relative %: 84 %
Platelets: 146 10*3/uL — ABNORMAL LOW (ref 150–400)
RBC: 4.09 MIL/uL — ABNORMAL LOW (ref 4.22–5.81)
RDW: 13.7 % (ref 11.5–15.5)
WBC: 6.7 10*3/uL (ref 4.0–10.5)
nRBC: 0 % (ref 0.0–0.2)

## 2021-12-28 LAB — COMPREHENSIVE METABOLIC PANEL
ALT: 163 U/L — ABNORMAL HIGH (ref 0–44)
AST: 48 U/L — ABNORMAL HIGH (ref 15–41)
Albumin: 3.1 g/dL — ABNORMAL LOW (ref 3.5–5.0)
Alkaline Phosphatase: 83 U/L (ref 38–126)
Anion gap: 7 (ref 5–15)
BUN: 18 mg/dL (ref 8–23)
CO2: 22 mmol/L (ref 22–32)
Calcium: 9.1 mg/dL (ref 8.9–10.3)
Chloride: 108 mmol/L (ref 98–111)
Creatinine, Ser: 0.83 mg/dL (ref 0.61–1.24)
GFR, Estimated: >60 mL/min (ref >60)
Glucose, Bld: 109 mg/dL — ABNORMAL HIGH (ref 70–99)
Potassium: 4.2 mmol/L (ref 3.5–5.1)
Sodium: 137 mmol/L (ref 135–145)
Total Bilirubin: 0.8 mg/dL (ref 0.3–1.2)
Total Protein: 5.9 g/dL — ABNORMAL LOW (ref 6.5–8.1)

## 2021-12-28 LAB — D-DIMER, QUANTITATIVE: D-Dimer, Quant: 0.93 ug/mL-FEU — ABNORMAL HIGH (ref 0.00–0.50)

## 2021-12-28 LAB — C-REACTIVE PROTEIN: CRP: 1.5 mg/dL — ABNORMAL HIGH (ref <1.0)

## 2021-12-28 LAB — MAGNESIUM: Magnesium: 2 mg/dL (ref 1.7–2.4)

## 2021-12-28 LAB — GLUCOSE, CAPILLARY
Glucose-Capillary: 108 mg/dL — ABNORMAL HIGH (ref 70–99)
Glucose-Capillary: 129 mg/dL — ABNORMAL HIGH (ref 70–99)
Glucose-Capillary: 182 mg/dL — ABNORMAL HIGH (ref 70–99)
Glucose-Capillary: 89 mg/dL (ref 70–99)
Glucose-Capillary: 107 mg/dL — ABNORMAL HIGH (ref 70–99)
Glucose-Capillary: 117 mg/dL — ABNORMAL HIGH (ref 70–99)
Glucose-Capillary: 116 mg/dL — ABNORMAL HIGH (ref 70–99)

## 2021-12-28 LAB — LACTATE DEHYDROGENASE: LDH: 179 U/L (ref 98–192)

## 2021-12-28 LAB — FERRITIN: Ferritin: 82 ng/mL (ref 24–336)

## 2021-12-28 MED ORDER — SODIUM CHLORIDE 0.9 % IV SOLN: INTRAVENOUS | Status: DC | PRN

## 2021-12-28 MED ORDER — METOPROLOL TARTRATE 12.5 MG HALF TABLET
12.5000 mg | ORAL_TABLET | Freq: Two times a day (BID) | ORAL | Status: DC
  Administered 2021-12-28 – 2022-01-01 (×9): 12.5 mg via ORAL
  Filled 2021-12-28 (×9): qty 1

## 2021-12-28 NOTE — Progress Notes (Signed)
?PROGRESS NOTE ? ? ? Tony [name]  [id number]:[id number] DOB: 05/17/1941 DOA: 12/24/2021 ?PCP: [name], [name], MD  ? ?Brief Narrative:  ?81 y.o. WM PMHx anxiety DO, thoracic aortic aneurysm, prostate cancer, tobacco abuse, essential HTN, trifascicular block, CAD, sick sinus syndrome, s/p pacemaker, chronic respiratory failure with hypoxia, COPD mixed type, OSA ? ?Presented to [facility] with a chief complaint of shortness of breath and generalized weakness ?  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  ?He developed shortness of breath and generalized weakness overnight on 4/11 - 4/12.  Denies nausea, vomiting, diarrhea ?  ?ED course was notable for BP 84/58, initial oxygen saturation of 79%, and HR 135.  A code sepsis was initiated.  2 L of IV fluid and blood cultures were ordered, broad-spectrum antibiotics started, patient placed on BiPAP.  The patient was found to be COVID-positive.   ? ? ?Subjective: ?4/16 afebrile overnight A/O x4, sitting comfortably in bed.  States ambulated around room several times. ? ? ? ?Assessment & Plan: ? Covid vaccination; ? ?Principal Problem: ?  Respiratory failure with hypoxia ([place]) ?Active Problems: ?  Aneurysm of thoracic aorta (HCC) ?  COPD mixed type ([place]) ?  Malignant neoplasm of prostate ([place]) ?  Anxiety disorder ?  Tobacco use disorder ?  Hypercholesterolemia ?  Essential hypertension, benign ?  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[place]) ?  Chronic fatigue ?  Trifascicular block ?  Coronary artery disease involving native coronary artery of native heart without angina pectoris ?  Sick sinus syndrome ([place]) ?  Pacemaker ?  Obesity with body mass index (BMI) of 30.0 to 39.9 ?  Prediabetes ?  OSA (obstructive sleep apnea) ?  Chronic respiratory failure with hypoxia (HCC) ?  COVID ?  Severe sepsis ([place]) ?  Transaminitis ?   Abdominal aortic aneurysm (AAA) 3.0 cm to 5.5 cm in diameter in male [facility]) ? ?COVID-19 Labs ? ?Recent Labs  ?  12/26/21 ?5784 12/27/21 ?1052 12/28/21 ?0345  ?DDIMER 2.25* 1.07* 0.93*  ?FERRITIN 112 84 82  ?LDH 186 190 179  ?CRP 7.7* 2.8* 1.5*  ? ? ? ?Lab Results  ?Component Value Date  ? SARSCOV2NAA POSITIVE (A) 12/24/2021  ? [place] NEGATIVE 09/27/2021  ? [place] NEGATIVE 09/19/2021  ? [place] NEGATIVE 07/13/2021  ? [place] NEGATIVE 07/13/2021  ? ?   ? ?Acute respiratory failure due to COVID 19 viral pneumonia ?-No evidence of PE ?-stop Baricitinib with Bacteremia and elevated liver enzymes ?-con't steroids-- decreased to dexamethasone ?-con't supplemental O2 as required to maintain SpO2 >88% ?-CPAP QHS ?-albuterol PRN, dulera BID ?-Decadron 6 mg daily ? ?COPD, not acutely exacerbated ? ? ?OSA ?-BiPAP per respiratory settings ? ?Sepsis positive E. coli bacteremia; concerned for biliary source of infection ?-deescalate antibiotics to metronidazole & cefepime ?-has not required vasopressors; would initiate if MAP <65 ?-RUQ [place] to evaluate for cholecystitis ? ?Abdominal aortic aneurysm ?- 4/14 CT abdomen:4.2 cm infrarenal abdominal aortic aneurysm ?-4/15 patient states he has a CT surgeon who follows his AAA. ? ? ?Lactic acidosis, resolved ?  ?HTN, HLD ?HX Sick sinus syndrome and trifascicular block s/p pacemaker placement ?HX infrarenal AAA ?Last ECHO 10/22 EF 60-65% ?-telemetry monitoring ?-con't daily aspirin ?-holding PTA antihypertensives, lasix ?-holding PTA atorvastatin with elevated transaminases levels ?-OP surveillance for AAA ?  ?Hyperbilirubinemia disproportionate to elevated transaminase levels ?-RUQ [place] to eval GB ?-liver US  with doppler to r/o hepatic vein thrombus due to hypercoagulability with covid ?  ?Anxiety ?-Con't celexa ?-con't PTA ativan PRN ?  ?GERD ?-con't PPI, which is a home med ? ?Cholelithiasis ?- 4/15 negative obstruction see CT findings below ?  ?Transaminitis  ?AST 1288,  ALT 643, bilirubin 2.8, being seen outpatient for issues with jaundice.  Acute on chronic with sepsis worsening currently. ?-Monitor ?-Supportive care as above ?-Check CK, hepatitis panel ?  ?Hyperglycemia ?-SSI  PRN ?-goal BG 140-180 ?-check A1c  ?  ?History of prostate cancer; s{ radiation, seed implant brachytherapy, androgen deprivation therapy ?-wife will bring his home supply of Zytiga; will need prednisone added back with this once his dexamethasone is completed ?  ? ?Obese (BMI 35.63 kg/m?.) ?- ? ?Goals of care ?- 4/16 PT/OT consult COVID pneumonia, deconditioning evaluate for CIR vs SNF ? ? ? ?Mobility Assessment (last 72 hours)   ? ? Mobility Assessment   ?No documentation. ? ?  ?  ? ?  ? ? ?DVT prophylaxis: Lovenox ?Code Status: Full ?Family Communication: 4/15 discussed with daughter and wife plan of care all questions answered ?Status is: Inpatient ? ? ? ?Dispo: The patient is from: Home ?             Anticipated d/c is to: Home ?             Anticipated d/c date is: > 3 days ?             Patient currently is not medically stable to d/c. ? ? ? ? ? ?Consultants:  ?PCCM ?ID ? ?Procedures/Significant Events:  ?4/12 presented to [facility], PCCM consult, COVID+, BC>, UA>, started on heparin empirically due to concern for PE, started on cefepime, vanc, flagyl ?4/13 US abdomen Limited RUQ ?Small dependent stones versus sludge in the gallbladder. But no ?evidence of acute cholecystitis or bile duct obstruction. ?2. Negative ultrasound appearance of the liver. ?4/13 US liver Doppler ?Suboptimal evaluation, with poor acoustic penetration secondary to ?patient habitus. ?1. Normal liver Doppler evaluation ?2. Incidental senescent renal change, with diffuse RIGHT renal ?cortical thinning. ?4/14 CT abdomen pelvis W contrast ?Dependent atelectasis/consolidation in the visualized lung bases ?with trace effusions. ?2. Coronary and Aortic Atherosclerosis (ICD10-170.0). ?3. Cholelithiasis without CT evidence of cholecystitis  or biliary ?obstruction. ?4. 4.2 cm infrarenal abdominal aortic aneurysm. Recommend follow-up ?every 12 months and vascular consultation ? ? ?I have personally reviewed and interpreted all radiology studies and my findings are as above. ? ?VENTILATOR SETTINGS: ?Nasal cannula 4/16 ?Flow 2 L/min ?SPO2 96% ? ? ?Cultures ?4/12 RIGHT AC positive E. coli ?4/12 RIGHT wrist positive E. coli ? ? ?Antimicrobials: ?Anti-infectives (From admission, onward)  ? ? Start     Ordered Stop  ? 12/25/21 1600  piperacillin-tazobactam (ZOSYN) IVPB 3.375 g  Status:  Discontinued       ? 12/25/21 0758 12/25/21 0808  ? 12/25/21 1200  vancomycin (VANCOREADY) IVPB 1250 mg/250 mL  Status:  Discontinued       ? 12/24/21 1317 12/25/21 0630  ? 12/25/21 0900  cefTRIAXone (ROCEPHIN) 2 g in sodium chloride 0.9 % 100 mL IVPB       ? 12/25/21 0808    ? 12/25/21 0900  metroNIDAZOLE (FLAGYL) IVPB 500 mg       ? 12/25/21 0808    ? 12/25/21 0845  piperacillin-tazobactam (ZOSYN) IVPB 3.375 g  Status:  Discontinued       ? 12/25/21 0758 12/25/21 0808  ? 12/24/21 1930  ceFEPIme (  MAXIPIME) 2 g in sodium chloride 0.9 % 100 mL IVPB  Status:  Discontinued       ? 12/24/21 1317 12/25/21 0735  ? 12/24/21 1230  vancomycin (VANCOCIN) IVPB 1000 mg/200 mL premix       ? 12/24/21 1120 12/24/21 1428  ? 12/24/21 1130  aztreonam (AZACTAM) 2 g in sodium chloride 0.9 % 100 mL IVPB  Status:  Discontinued       ? 12/24/21 1115 12/24/21 1119  ? 12/24/21 1130  metroNIDAZOLE (FLAGYL) IVPB 500 mg       ? 12/24/21 1115 12/24/21 1302  ? 12/24/21 1130  vancomycin (VANCOCIN) IVPB 1000 mg/200 mL premix       ? 12/24/21 1115 12/24/21 1234  ? 12/24/21 1130  ceFEPIme (MAXIPIME) 2 g in sodium chloride 0.9 % 100 mL IVPB       ? 12/24/21 1119 12/24/21 1159  ? ?  ?  ? ? ?Devices ?  ? ?LINES / TUBES:  ? ? ? ? ?Continuous Infusions: ? sodium chloride 10 mL/hr at 12/28/21 0957  ? cefTRIAXone (ROCEPHIN)  IV 2 g (12/28/21 1002)  ? metronidazole 500 mg (12/28/21 7116)  ? ? ? ?Objective: ?Vitals:   ? 12/28/21 0847 12/28/21 0850 12/28/21 0853 12/28/21 0900  ?BP: 138/87 (!) 141/88    ?Pulse: 68  65 63  ?Resp: 17  (!) 23 (!) 21  ?Temp:      ?TempSrc:      ?SpO2: 97%  95% 96%  ?Weight:      ?Height:

## 2021-12-28 NOTE — Progress Notes (Signed)
Pt refusing BIPAP 

## 2021-12-29 ENCOUNTER — Inpatient Hospital Stay (HOSPITAL_COMMUNITY): Payer: Medicare Other

## 2021-12-29 DIAGNOSIS — J9611 Chronic respiratory failure with hypoxia: Secondary | ICD-10-CM | POA: Diagnosis not present

## 2021-12-29 DIAGNOSIS — F064 Anxiety disorder due to known physiological condition: Secondary | ICD-10-CM | POA: Diagnosis not present

## 2021-12-29 DIAGNOSIS — I714 Abdominal aortic aneurysm, without rupture, unspecified: Secondary | ICD-10-CM | POA: Diagnosis not present

## 2021-12-29 DIAGNOSIS — J9601 Acute respiratory failure with hypoxia: Secondary | ICD-10-CM | POA: Diagnosis not present

## 2021-12-29 LAB — COMPREHENSIVE METABOLIC PANEL
ALT: 135 U/L — ABNORMAL HIGH (ref 0–44)
AST: 55 U/L — ABNORMAL HIGH (ref 15–41)
Albumin: 3 g/dL — ABNORMAL LOW (ref 3.5–5.0)
Alkaline Phosphatase: 75 U/L (ref 38–126)
Anion gap: 7 (ref 5–15)
BUN: 18 mg/dL (ref 8–23)
CO2: 23 mmol/L (ref 22–32)
Calcium: 8.9 mg/dL (ref 8.9–10.3)
Chloride: 107 mmol/L (ref 98–111)
Creatinine, Ser: 0.96 mg/dL (ref 0.61–1.24)
GFR, Estimated: >60 mL/min (ref >60)
Glucose, Bld: 105 mg/dL — ABNORMAL HIGH (ref 70–99)
Potassium: 4.1 mmol/L (ref 3.5–5.1)
Sodium: 137 mmol/L (ref 135–145)
Total Bilirubin: 0.9 mg/dL (ref 0.3–1.2)
Total Protein: 5.9 g/dL — ABNORMAL LOW (ref 6.5–8.1)

## 2021-12-29 LAB — CBC WITH DIFFERENTIAL/PLATELET
Abs Immature Granulocytes: 0.36 10*3/uL — ABNORMAL HIGH (ref 0.00–0.07)
Basophils Absolute: 0 10*3/uL (ref 0.0–0.1)
Basophils Relative: 1 %
Eosinophils Absolute: 0 10*3/uL (ref 0.0–0.5)
Eosinophils Relative: 0 %
HCT: 38.5 % — ABNORMAL LOW (ref 39.0–52.0)
Hemoglobin: 12.7 g/dL — ABNORMAL LOW (ref 13.0–17.0)
Immature Granulocytes: 5 %
Lymphocytes Relative: 9 %
Lymphs Abs: 0.7 10*3/uL (ref 0.7–4.0)
MCH: 30.7 pg (ref 26.0–34.0)
MCHC: 33 g/dL (ref 30.0–36.0)
MCV: 93 fL (ref 80.0–100.0)
Monocytes Absolute: 0.7 10*3/uL (ref 0.1–1.0)
Monocytes Relative: 9 %
Neutro Abs: 5.8 10*3/uL (ref 1.7–7.7)
Neutrophils Relative %: 76 %
Platelets: 164 10*3/uL (ref 150–400)
RBC: 4.14 MIL/uL — ABNORMAL LOW (ref 4.22–5.81)
RDW: 13.8 % (ref 11.5–15.5)
WBC: 7.5 10*3/uL (ref 4.0–10.5)
nRBC: 0 % (ref 0.0–0.2)

## 2021-12-29 LAB — GLUCOSE, CAPILLARY
Glucose-Capillary: 100 mg/dL — ABNORMAL HIGH (ref 70–99)
Glucose-Capillary: 113 mg/dL — ABNORMAL HIGH (ref 70–99)
Glucose-Capillary: 97 mg/dL (ref 70–99)
Glucose-Capillary: 149 mg/dL — ABNORMAL HIGH (ref 70–99)
Glucose-Capillary: 111 mg/dL — ABNORMAL HIGH (ref 70–99)

## 2021-12-29 LAB — D-DIMER, QUANTITATIVE: D-Dimer, Quant: 0.79 ug/mL-FEU — ABNORMAL HIGH (ref 0.00–0.50)

## 2021-12-29 LAB — MAGNESIUM: Magnesium: 1.8 mg/dL (ref 1.7–2.4)

## 2021-12-29 LAB — PHOSPHORUS: Phosphorus: 3.8 mg/dL (ref 2.5–4.6)

## 2021-12-29 LAB — LACTATE DEHYDROGENASE: LDH: 182 U/L (ref 98–192)

## 2021-12-29 LAB — C-REACTIVE PROTEIN: CRP: 0.8 mg/dL (ref <1.0)

## 2021-12-29 LAB — FERRITIN: Ferritin: 70 ng/mL (ref 24–336)

## 2021-12-29 MED ORDER — GADOBUTROL 1 MMOL/ML IV SOLN
10.0000 mL | Freq: Once | INTRAVENOUS | Status: AC | PRN
  Administered 2021-12-29: 10 mL via INTRAVENOUS

## 2021-12-29 MED ORDER — LISINOPRIL 5 MG PO TABS
5.0000 mg | ORAL_TABLET | Freq: Every day | ORAL | Status: DC
  Administered 2021-12-29 – 2022-01-01 (×4): 5 mg via ORAL
  Filled 2021-12-29 (×4): qty 1

## 2021-12-29 MED ORDER — ABIRATERONE ACETATE 250 MG PO TABS: 250.0000 mg | ORAL_TABLET | Freq: Every day | ORAL | Status: DC

## 2021-12-29 MED ORDER — PREDNISONE 5 MG PO TABS
5.0000 mg | ORAL_TABLET | Freq: Every day | ORAL | Status: DC
  Administered 2021-12-30: 5 mg via ORAL
  Filled 2021-12-29: qty 1

## 2021-12-29 NOTE — Progress Notes (Signed)
?PROGRESS NOTE ? ? ? Tony [name]  HFW:[id number] DOB: 04-Sep-1941 DOA: 12/24/2021 ?PCP: [name], [name], MD  ? ?Brief Narrative:  ?81 y.o. WM PMHx anxiety DO, thoracic aortic aneurysm, prostate cancer, tobacco abuse, essential HTN, trifascicular block, CAD, sick sinus syndrome, s/p pacemaker, chronic respiratory failure with hypoxia, COPD mixed type, OSA ? ?Presented to [facility] with a chief complaint of shortness of breath and generalized weakness ?  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  ?He developed shortness of breath and generalized weakness overnight on 4/11 - 4/12.  Denies nausea, vomiting, diarrhea ?  ?ED course was notable for BP 84/58, initial oxygen saturation of 79%, and HR 135.  A code sepsis was initiated.  2 L of IV fluid and blood cultures were ordered, broad-spectrum antibiotics started, patient placed on BiPAP.  The patient was found to be COVID-positive.   ? ? ?Subjective: ?4/17 afebrile overnight A/O x4, sitting in chair comfortably ? ? ?Assessment & Plan: ? Covid vaccination; ? ?Principal Problem: ?  Respiratory failure with hypoxia ([place]) ?Active Problems: ?  Aneurysm of thoracic aorta (HCC) ?  COPD mixed type ([place]) ?  Malignant neoplasm of prostate ([place]) ?  Anxiety disorder ?  Tobacco use disorder ?  Hypercholesterolemia ?  Essential hypertension, benign ?  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[place]) ?  Chronic fatigue ?  Trifascicular block ?  Coronary artery disease involving native coronary artery of native heart without angina pectoris ?  Sick sinus syndrome ([place]) ?  Pacemaker ?  Obesity with body mass index (BMI) of 30.0 to 39.9 ?  Prediabetes ?  OSA (obstructive sleep apnea) ?  Chronic respiratory failure with hypoxia (HCC) ?  COVID ?  Severe sepsis ([place]) ?  Transaminitis ?  Abdominal aortic aneurysm (AAA) 3.0 cm to 5.5 cm  in diameter in male [facility]) ? ?COVID-19 Labs ? ?Recent Labs  ?  12/27/21 ?1052 12/28/21 ?0345 12/29/21 ?0052  ?DDIMER 1.07* 0.93* 0.79*  ?FERRITIN 84 82 70  ?LDH 190 179 182  ?CRP 2.8* 1.5* 0.8  ? ? ? ?Lab Results  ?Component Value Date  ? SARSCOV2NAA POSITIVE (A) 12/24/2021  ? [place] NEGATIVE 09/27/2021  ? [place] NEGATIVE 09/19/2021  ? [place] NEGATIVE 07/13/2021  ? [place] NEGATIVE 07/13/2021  ? ?   ? ?Acute respiratory failure due to COVID 19 viral pneumonia ?-No evidence of PE ?-stop Baricitinib with Bacteremia and elevated liver enzymes ?-con't steroids-- decreased to dexamethasone ?-con't supplemental O2 as required to maintain SpO2 >88% ?-CPAP QHS ?-albuterol PRN, dulera BID ?-Decadron 6 mg daily ?-4/17 ambulatory SPO2 pending ? ?COPD, not acutely exacerbated ?-See acute respiratory failure ? ?OSA ?-BiPAP per respiratory settings ? ?Sepsis positive E. coli bacteremia; concerned for biliary source of infection ?-deescalate antibiotics to metronidazole & cefepime ?-has not required vasopressors; would initiate if MAP <65 ?-RUQ [place] to evaluate for cholecystitis ?-4/17 MRCP abdomen per ID recommendation pending.  They will make recommendations on length of antibiotic treatment once results obtained. ? ?Abdominal aortic aneurysm ?- 4/14 CT abdomen:4.2 cm infrarenal abdominal aortic aneurysm ?-4/15 patient states he has a CT surgeon who follows his AAA. ? ?Lactic acidosis ?-resolved ?  ?Essential HTN,  ?-Target BP <120/80 given patient's AAA ?-Metoprolol 12.5 mg BID ?-4/17 lisinopril 5 mg daily ? ?Sick sinus syndrome and trifascicular block s/p pacemaker placement ? ? ?Infrarenal AAA ?Last ECHO 10/22 EF 60-65% ?-  telemetry monitoring ?-con't daily aspirin ?-holding PTA antihypertensives, lasix ?-holding PTA atorvastatin with elevated transaminases levels ?-OP surveillance for AAA ?- ? ?HLD ?-4/17 lipid panel pending ?  ?Hyperbilirubinemia disproportionate to elevated transaminase levels ?-RUQ [place] to  eval GB ?-liver US with doppler to r/o hepatic vein thrombus due to hypercoagulability with covid ?-Liver enzymes improving ? ?Cholelithiasis ?- 4/15 negative obstruction see CT findings below ?  ?Transaminitis  ?AST 1288, ALT 643, bilirubin 2.8, being seen outpatient for issues with jaundice.  Acute on chronic with sepsis worsening currently. ?-Monitor ?-Supportive care as above ?-Hepatitis panel negative ?  ?Anxiety ?-Con't celexa ?-con't PTA ativan PRN ?  ?GERD ?-con't PPI, which is a home med ? ?  ?Hyperglycemia ?-SSI  PRN ?-goal BG 140-180 ?-check A1c  ?  ?History of prostate cancer; s{ radiation, seed implant brachytherapy, androgen deprivation therapy ?-Zytiga 250 mg daily ?-Restart Prednisone 5 mg daily;   ? ?Obese (BMI 35.63 kg/m?.) ?- ? ?Goals of care ?- 4/16 PT/OT consult COVID pneumonia, deconditioning evaluate for CIR vs SNF ? ? ? ?Mobility Assessment (last 72 hours)   ? ? Mobility Assessment   ?No documentation. ? ?  ?  ? ?  ? ? ?DVT prophylaxis: Lovenox ?Code Status: Full ?Family Communication: 4/17 discussed with daughter and wife plan of care all questions answered ?Status is: Inpatient ? ? ? ?Dispo: The patient is from: Home ?             Anticipated d/c is to: Home ?             Anticipated d/c date is: > 3 days ?             Patient currently is not medically stable to d/c. ? ? ? ? ? ?Consultants:  ?PCCM ?ID ? ?Procedures/Significant Events:  ?4/12 presented to [facility], PCCM consult, COVID+, BC>, UA>, started on heparin empirically due to concern for PE, started on cefepime, vanc, flagyl ?4/13 US abdomen Limited RUQ ?Small dependent stones versus sludge in the gallbladder. But no ?evidence of acute cholecystitis or bile duct obstruction. ?2. Negative ultrasound appearance of the liver. ?4/13 US liver Doppler ?Suboptimal evaluation, with poor acoustic penetration secondary to ?patient habitus. ?1. Normal liver Doppler evaluation ?2. Incidental senescent renal change, with diffuse RIGHT  renal ?cortical thinning. ?4/14 CT abdomen pelvis W contrast ?Dependent atelectasis/consolidation in the visualized lung bases ?with trace effusions. ?2. Coronary and Aortic Atherosclerosis (ICD10-170.0). ?3. Cholelithiasis without CT evidence of cholecystitis or biliary ?obstruction. ?4. 4.2 cm infrarenal abdominal aortic aneurysm. Recommend follow-up ?every 12 months and vascular consultation ? ? ?I have personally reviewed and interpreted all radiology studies and my findings are as above. ? ?VENTILATOR SETTINGS: ?Room air 4/17 ?SPO2 91% ? ? ?Cultures ?4/12 RIGHT AC positive E. coli ?4/12 RIGHT wrist positive E. coli ?4/12 acute hepatitis panel negative ? ? ? ?Antimicrobials: ?Anti-infectives (From admission, onward)  ? ? Start     Ordered Stop  ? 12/25/21 1600  piperacillin-tazobactam (ZOSYN) IVPB 3.375 g  Status:  Discontinued       ? 12/25/21 0758 12/25/21 0808  ? 12/25/21 1200  vancomycin (VANCOREADY) IVPB 1250 mg/250 mL  Status:  Discontinued       ? 12/24/21 1317 12/25/21 0630  ? 12/25/21 0900  cefTRIAXone (ROCEPHIN) 2 g in sodium chloride 0.9 % 100 mL IVPB       ? 12/25/21 0808    ? 12/25/21 0900  metroNIDAZOLE (FLAGYL) IVPB 500 mg       ?  12/25/21 0808    ? 12/25/21 0845  piperacillin-tazobactam (ZOSYN) IVPB 3.375 g  Status:  Discontinued       ? 12/25/21 0758 12/25/21 0808  ? 12/24/21 1930  ceFEPIme (MAXIPIME) 2 g in sodium chloride 0.9 % 100 mL IVPB  Status:  Discontinued       ? 12/24/21 1317 12/25/21 0735  ? 12/24/21 1230  vancomycin (VANCOCIN) IVPB 1000 mg/200 mL premix       ? 12/24/21 1120 12/24/21 1428  ? 12/24/21 1130  aztreonam (AZACTAM) 2 g in sodium chloride 0.9 % 100 mL IVPB  Status:  Discontinued       ? 12/24/21 1115 12/24/21 1119  ? 12/24/21 1130  metroNIDAZOLE (FLAGYL) IVPB 500 mg       ? 12/24/21 1115 12/24/21 1302  ? 12/24/21 1130  vancomycin (VANCOCIN) IVPB 1000 mg/200 mL premix       ? 12/24/21 1115 12/24/21 1234  ? 12/24/21 1130  ceFEPIme (MAXIPIME) 2 g in sodium chloride 0.9 % 100  mL IVPB       ? 12/24/21 1119 12/24/21 1159  ? ?  ?  ? ? ?Devices ?  ? ?LINES / TUBES:  ? ? ? ? ?Continuous Infusions: ? sodium chloride Stopped (12/28/21 2259)  ? cefTRIAXone (ROCEPHIN)  IV 2 g (12/29/21 0846)  ? metr

## 2021-12-29 NOTE — Progress Notes (Signed)
Per order, Changed device settings for MRI to DOO at 85 bpm   Tachy-therapies to off if applicable.   Will program device back to pre-MRI settings after completion of exam. 

## 2021-12-29 NOTE — Evaluation (Signed)
Physical Therapy Evaluation ?Patient Details ?Name: Tony [name] ?MRN: [id number] ?DOB: 18-Jun-1941 ?Today's Date: 12/29/2021 ? ?History of Present Illness ? 81 yo male presenting to ED with with SOB and hypotension. Tested COVID +. 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  ?Clinical Impression ? PTA, pt lives with his family, uses a cane for mobility and requires assist for IADL's. Pt appears to be fairly close to his functional baseline. Presents with decreased cardiopulmonary endurance, mild balance deficits and functional weakness. Pt ambulating 100 ft with a RW at a min guard assist level. SpO2 93% on RA, BP 151/97, RR 21. Would benefit from follow up HHPT to address deficits. ?   ? ?Recommendations for follow up therapy are one component of a multi-disciplinary discharge planning process, led by the attending physician.  Recommendations may be updated based on patient status, additional functional criteria and insurance authorization. ? ?Follow Up Recommendations Home health PT ? ?  ?Assistance Recommended at Discharge PRN  ?Patient can return home with the following ? A little help with walking and/or transfers;A little help with bathing/dressing/bathroom;Assistance with cooking/housework;Assist for transportation;Help with stairs or ramp for entrance ? ?  ?Equipment Recommendations None recommended by PT  ?Recommendations for Other Services ?    ?  ?Functional Status Assessment Patient has had a recent decline in their functional status and demonstrates the ability to make significant improvements in function in a reasonable and predictable amount of time.  ? ?  ?Precautions / Restrictions Precautions ?Precautions: Fall;Other (comment) ?Precaution Comments: COVID ?Restrictions ?Weight Bearing Restrictions: No  ? ?  ? ?Mobility ? Bed Mobility ?  ?  ?  ?  ?  ?  ?  ?General bed mobility  comments: In recliner upon arrival ?  ? ?Transfers ?Overall transfer level: Needs assistance ?Equipment used: Rolling walker (2 wheels) ?Transfers: Sit to/from Stand ?Sit to Stand: Supervision ?  ?  ?  ?  ?  ?General transfer comment: cues for hand placement ?  ? ?Ambulation/Gait ?Ambulation/Gait assistance: Min guard ?Gait Distance (Feet): 100 Feet ?Assistive device: Rolling walker (2 wheels) ?Gait Pattern/deviations: Step-through pattern, Decreased stride length ?Gait velocity: decreased ?  ?  ?General Gait Details: cues for activity pacing, slow and steady pace, min guard for safety ? ?Stairs ?  ?  ?  ?  ?  ? ?Wheelchair Mobility ?  ? ?Modified Rankin (Stroke Patients Only) ?  ? ?  ? ?Balance Overall balance assessment: Needs assistance ?Sitting-balance support: No upper extremity supported, Feet supported ?Sitting balance-Leahy Scale: Good ?  ?  ?Standing balance support: No upper extremity supported, During functional activity, Single extremity supported ?Standing balance-Leahy Scale: Fair ?  ?  ?  ?  ?  ?  ?  ?  ?  ?  ?  ?  ?   ? ? ? ?Pertinent Vitals/Pain Pain Assessment ?Pain Assessment: Faces ?Faces Pain Scale: No hurt  ? ? ?Home Living Family/patient expects to be discharged to:: Private residence ?Living Arrangements: Spouse/significant other;Children;Other relatives (Wife, daughter and her family) ?Available Help at Discharge: Family;Available 24 hours/day ?Type of Home: House ?Home Access: Stairs to enter ?Entrance Stairs-Rails: Right ?Entrance Stairs-Number of Steps: 2 ?  ?Home Layout: One level ?Home Equipment: Conservation officer, nature (2 wheels);Rollator (4 wheels);Cane - single point;Grab bars - tub/shower;Shower seat - built in ?Additional Comments: Grab bar on the bed  ?  ?Prior Function Prior Level of Function : Independent/Modified  Independent ?  ?  ?  ?  ?  ?  ?Mobility Comments: uses cane vs walker ?ADLs Comments: Performing ADLs. Wife helps with IADLs. ?  ? ? ?Hand Dominance  ? Dominant Hand: Right ? ?   ?Extremity/Trunk Assessment  ? Upper Extremity Assessment ?Upper Extremity Assessment: Overall WFL for tasks assessed ?  ? ?Lower Extremity Assessment ?Lower Extremity Assessment: RLE deficits/detail;LLE deficits/detail ?RLE Deficits / Details: Grossly 4/5 ?LLE Deficits / Details: Grossly 4/5 ?  ? ?Cervical / Trunk Assessment ?Cervical / Trunk Assessment: Kyphotic  ?Communication  ? Communication: No difficulties  ?Cognition Arousal/Alertness: Awake/alert ?Behavior During Therapy: [facility] for tasks assessed/performed ?Overall Cognitive Status: Within Functional Limits for tasks assessed ?  ?  ?  ?  ?  ?  ?  ?  ?  ?  ?  ?  ?  ?  ?  ?  ?  ?  ?  ? ?  ?General Comments General comments (skin integrity, edema, etc.): SpO2 90s on RA. RR 20s. BP 156/92 (112). HR 99-120 ? ?  ?Exercises    ? ?Assessment/Plan  ?  ?PT Assessment Patient needs continued PT services  ?PT Problem List Decreased strength;Decreased activity tolerance;Decreased balance;Decreased mobility;Cardiopulmonary status limiting activity ? ?   ?  ?PT Treatment Interventions DME instruction;Gait training;Therapeutic activities;Functional mobility training;Therapeutic exercise;Balance training;Patient/family education   ? ?PT Goals (Current goals can be found in the Care Plan section)  ?Acute Rehab PT Goals ?Patient Stated Goal: return home ?PT Goal Formulation: With patient ?Time For Goal Achievement: 01/12/22 ?Potential to Achieve Goals: Good ? ?  ?Frequency Min 3X/week ?  ? ? ?Co-evaluation   ?  ?  ?  ?  ? ? ?  ?AM-PAC PT "6 Clicks" Mobility  ?Outcome Measure Help needed turning from your back to your side while in a flat bed without using bedrails?: None ?Help needed moving from lying on your back to sitting on the side of a flat bed without using bedrails?: None ?Help needed moving to and from a bed to a chair (including a wheelchair)?: A Little ?Help needed standing up from a chair using your arms (e.g., wheelchair or bedside chair)?: A Little ?Help needed  to walk in hospital room?: A Little ?Help needed climbing 3-5 steps with a railing? : A Lot ?6 Click Score: 19 ? ?  ?End of Session   ?Activity Tolerance: Patient tolerated treatment well ?Patient left: in chair;with call bell/phone within reach;with family/visitor present ?Nurse Communication: Mobility status ?PT Visit Diagnosis: Unsteadiness on feet (R26.81);Muscle weakness (generalized) (M62.81);Difficulty in walking, not elsewhere classified (R26.2) ?  ? ?Time: 1130-1157 ?PT Time Calculation (min) (ACUTE ONLY): 27 min ? ? ?Charges:   PT Evaluation ?$PT Eval Moderate Complexity: 1 Mod ?PT Treatments ?$Therapeutic Activity: 8-22 mins ?  ?   ? ? ?Wyona Almas, PT, DPT ?Acute Rehabilitation Services ?Pager [phone] ?Office [phone] ? ? ?Carloine Margo Aye ?12/29/2021, 1:09 PM ? ?

## 2021-12-29 NOTE — Progress Notes (Signed)
Informed of MRI for today.  ? ?Device system confirmed to be MRI conditional, with implant date > 6 weeks ago, and no evidence of abandoned or epicardial leads in review of most recent CXR ?Interrogation from today reviewed, pt is currently AP-VS at ~60 bpm ?Change device settings for MRI to DOO at 85 bpm ? ?Tachy-therapies to off if applicable. ? ?Program device back to pre-MRI settings after completion of exam. ? ?Shirley Friar, PA-C  ?12/29/2021 2:13 PM   ?

## 2021-12-29 NOTE — Evaluation (Signed)
Occupational Therapy Evaluation ?Patient Details ?Name: Tony [name] ?MRN: [id number] ?DOB: January 10, 1941 ?Today's Date: 12/29/2021 ? ? ?History of Present Illness 81 yo male presenting to ED with with SOB and hypotension. Tested COVID +. 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  ? ?Clinical Impression ?  ?PTA, pt was living with his wife and daughter (and her family) and was performing BADLs; family assists with IADLs. Pt uses a cane for functional mobility at home. Pt currently requiring Min A for ADLs and functional mobility. Pt  presenting with decreased activity tolerance compared to PLOF and requiring increased time. VSS on RA. Pt would benefit from further acute OT to facilitate safe dc. Recommend dc to home with HHOT for further OT to optimize safety, independence with ADLs, and return to PLOF.  ?   ? ?Recommendations for follow up therapy are one component of a multi-disciplinary discharge planning process, led by the attending physician.  Recommendations may be updated based on patient status, additional functional criteria and insurance authorization.  ? ?Follow Up Recommendations ? Home health OT  ?  ?Assistance Recommended at Discharge Frequent or constant Supervision/Assistance  ?Patient can return home with the following A little help with walking and/or transfers;A little help with bathing/dressing/bathroom ? ?  ?Functional Status Assessment ? Patient has had a recent decline in their functional status and demonstrates the ability to make significant improvements in function in a reasonable and predictable amount of time.  ?Equipment Recommendations ? BSC/3in1  ?  ?Recommendations for Other Services PT consult ? ? ?  ?Precautions / Restrictions Precautions ?Precautions: Fall;Other (comment) ?Precaution Comments: COVID ?Restrictions ?Weight Bearing Restrictions: No  ? ?  ? ?Mobility  Bed Mobility ?  ?  ?  ?  ?  ?  ?  ?General bed mobility comments: In recliner upon arrival ?  ? ?Transfers ?Overall transfer level: Needs assistance ?Equipment used: None ?Transfers: Sit to/from Stand ?Sit to Stand: Min assist, Min guard ?  ?  ?  ?  ?  ?General transfer comment: Min A for weight shift forward to stand from recliner. Min Guard A fro [employer] wiht use of grab bars ?  ? ?  ?Balance Overall balance assessment: Needs assistance ?Sitting-balance support: No upper extremity supported, Feet supported ?Sitting balance-Leahy Scale: Good ?  ?  ?Standing balance support: No upper extremity supported, During functional activity, Single extremity supported ?Standing balance-Leahy Scale: Fair ?  ?  ?  ?  ?  ?  ?  ?  ?  ?  ?  ?  ?   ? ?ADL either performed or assessed with clinical judgement  ? ?ADL Overall ADL's : Needs assistance/impaired ?Eating/Feeding: Set up;Sitting ?  ?Grooming: Wash/dry face;Min guard;Standing ?  ?Upper Body Bathing: Minimal assistance;Sitting ?  ?Lower Body Bathing: Minimal assistance;Sit to/from stand ?  ?Upper Body Dressing : Minimal assistance;Sitting ?  ?Lower Body Dressing: Minimal assistance;Sit to/from stand ?Lower Body Dressing Details (indicate cue type and reason): Able to doff socks. Min A to don socks at pt was sitting on toilet and difficulty achieving figure four position ?Toilet Transfer: Min guard;Ambulation;Regular Toilet;Grab bars ?  ?Toileting- Clothing Manipulation and Hygiene: Supervision/safety;Min guard;Sitting/lateral lean;Sit to/from stand ?Toileting - Clothing Manipulation Details (indicate cue type and reason): Min Guard A in standing ?  ?  ?Functional mobility during ADLs: Min guard (single hand held A) ?General ADL Comments: Pt presenting with decreased activity tolerance but not  far for functional baseline  ? ? ? ?Vision Baseline Vision/History: 1 Wears glasses (reading) ?   ?   ?Perception   ?  ?Praxis   ?  ? ?Pertinent Vitals/Pain Pain Assessment ?Pain  Assessment: Faces ?Faces Pain Scale: No hurt ?Pain Intervention(s): Monitored during session  ? ? ? ?Hand Dominance Right ?  ?Extremity/Trunk Assessment Upper Extremity Assessment ?Upper Extremity Assessment: Overall WFL for tasks assessed ?  ?Lower Extremity Assessment ?Lower Extremity Assessment: Defer to PT evaluation ?  ?Cervical / Trunk Assessment ?Cervical / Trunk Assessment: Kyphotic ?  ?Communication Communication ?Communication: No difficulties ?  ?Cognition Arousal/Alertness: Awake/alert ?Behavior During Therapy: [facility] for tasks assessed/performed ?Overall Cognitive Status: Within Functional Limits for tasks assessed ?  ?  ?  ?  ?  ?  ?  ?  ?  ?  ?  ?  ?  ?  ?  ?  ?  ?  ?  ?General Comments  SpO2 90s on RA. RR 20s. BP 156/92 (112). HR 99-120 ? ?  ?Exercises   ?  ?Shoulder Instructions    ? ? ?Home Living Family/patient expects to be discharged to:: Private residence ?Living Arrangements: Spouse/significant other;Children;Other relatives (Wife, daughter and her family) ?Available Help at Discharge: Family;Available 24 hours/day ?Type of Home: House ?Home Access: Stairs to enter ?Entrance Stairs-Number of Steps: 2 ?Entrance Stairs-Rails: Right ?Home Layout: One level ?  ?  ?Bathroom Shower/Tub: Walk-in shower ?  ?Bathroom Toilet: Handicapped height ?  ?  ?Home Equipment: Conservation officer, nature (2 wheels);Rollator (4 wheels);Cane - single point;Grab bars - tub/shower;Shower seat - built in ?  ?Additional Comments: Grab bar on the bed ?  ? ?  ?Prior Functioning/Environment Prior Level of Function : Independent/Modified Independent ?  ?  ?  ?  ?  ?  ?Mobility Comments: uses cane vs walker ?ADLs Comments: Performing ADLs. Wife helps with IADLs. ?  ? ?  ?  ?OT Problem List: Decreased strength;Decreased range of motion;Impaired balance (sitting and/or standing);Decreased activity tolerance;Decreased knowledge of use of DME or AE;Decreased knowledge of precautions ?  ?   ?OT Treatment/Interventions: Therapeutic  exercise;Neuromuscular education;DME and/or AE instruction;Therapeutic activities;Patient/family education  ?  ?OT Goals(Current goals can be found in the care plan section) Acute Rehab OT Goals ?Patient Stated Goal: Go home ?OT Goal Formulation: With patient ?Time For Goal Achievement: 01/12/22 ?Potential to Achieve Goals: Good  ?OT Frequency: Min 2X/week ?  ? ?Co-evaluation   ?  ?  ?  ?  ? ?  ?AM-PAC OT "6 Clicks" Daily Activity     ?Outcome Measure Help from another person eating meals?: None ?Help from another person taking care of personal grooming?: A Little ?Help from another person toileting, which includes using toliet, bedpan, or urinal?: A Little ?Help from another person bathing (including washing, rinsing, drying)?: A Little ?Help from another person to put on and taking off regular upper body clothing?: A Little ?Help from another person to put on and taking off regular lower body clothing?: A Little ?6 Click Score: 19 ?  ?End of Session Nurse Communication: Mobility status ? ?Activity Tolerance: Patient tolerated treatment well ?Patient left: in chair;with call bell/phone within reach;with nursing/sitter in room;with family/visitor present ? ?OT Visit Diagnosis: Unsteadiness on feet (R26.81);Other abnormalities of gait and mobility (R26.89);Muscle weakness (generalized) (M62.81)  ?              ?Time: 8127-5170 ?OT Time Calculation (min): 25 min ?Charges:  OT General Charges ?$OT Visit: 1 Visit ?OT  Evaluation ?$OT Eval Moderate Complexity: 1 Mod ?OT Treatments ?$Self Care/Home Management : 8-22 mins ? ?Natasha Burda MSOT, OTR/L ?Acute Rehab ?Pager: [phone] ?Office: [phone] ? ?Fadumo Heng M Abner Ardis ?12/29/2021, 10:52 AM ?

## 2021-12-29 NOTE — Progress Notes (Signed)
Patient's wife called to alert [place] that he is in the hospital and his CT and liver tests noted and he was scheduled for an outpatient MRCP this week to rule out CBD stones and when his medical condition allows and he is breathing better and able to have his MRI please do before discharge and please call us if we can be of any assistance from a GI standpoint and certainly call us if MRCP is positive for CBD stones or other question or problem ?

## 2021-12-30 DIAGNOSIS — I714 Abdominal aortic aneurysm, without rupture, unspecified: Secondary | ICD-10-CM | POA: Diagnosis not present

## 2021-12-30 DIAGNOSIS — J9611 Chronic respiratory failure with hypoxia: Secondary | ICD-10-CM | POA: Diagnosis not present

## 2021-12-30 DIAGNOSIS — F064 Anxiety disorder due to known physiological condition: Secondary | ICD-10-CM | POA: Diagnosis not present

## 2021-12-30 DIAGNOSIS — J9601 Acute respiratory failure with hypoxia: Secondary | ICD-10-CM | POA: Diagnosis not present

## 2021-12-30 LAB — LIPID PANEL
Cholesterol: 191 mg/dL (ref 0–200)
HDL: 55 mg/dL (ref >40)
LDL Cholesterol: 121 mg/dL — ABNORMAL HIGH (ref 0–99)
Total CHOL/HDL Ratio: 3.5 RATIO
Triglycerides: 76 mg/dL (ref <150)
VLDL: 15 mg/dL (ref 0–40)

## 2021-12-30 LAB — COMPREHENSIVE METABOLIC PANEL
ALT: 112 U/L — ABNORMAL HIGH (ref 0–44)
AST: 54 U/L — ABNORMAL HIGH (ref 15–41)
Albumin: 3.4 g/dL — ABNORMAL LOW (ref 3.5–5.0)
Alkaline Phosphatase: 78 U/L (ref 38–126)
Anion gap: 11 (ref 5–15)
BUN: 20 mg/dL (ref 8–23)
CO2: 20 mmol/L — ABNORMAL LOW (ref 22–32)
Calcium: 8.8 mg/dL — ABNORMAL LOW (ref 8.9–10.3)
Chloride: 104 mmol/L (ref 98–111)
Creatinine, Ser: 0.86 mg/dL (ref 0.61–1.24)
GFR, Estimated: >60 mL/min (ref >60)
Glucose, Bld: 86 mg/dL (ref 70–99)
Potassium: 4.6 mmol/L (ref 3.5–5.1)
Sodium: 135 mmol/L (ref 135–145)
Total Bilirubin: 1.1 mg/dL (ref 0.3–1.2)
Total Protein: 6.2 g/dL — ABNORMAL LOW (ref 6.5–8.1)

## 2021-12-30 LAB — CBC WITH DIFFERENTIAL/PLATELET
Abs Immature Granulocytes: 0.48 10*3/uL — ABNORMAL HIGH (ref 0.00–0.07)
Basophils Absolute: 0 10*3/uL (ref 0.0–0.1)
Basophils Relative: 0 %
Eosinophils Absolute: 0 10*3/uL (ref 0.0–0.5)
Eosinophils Relative: 0 %
HCT: 39.3 % (ref 39.0–52.0)
Hemoglobin: 13.4 g/dL (ref 13.0–17.0)
Immature Granulocytes: 5 %
Lymphocytes Relative: 10 %
Lymphs Abs: 1.1 10*3/uL (ref 0.7–4.0)
MCH: 31.2 pg (ref 26.0–34.0)
MCHC: 34.1 g/dL (ref 30.0–36.0)
MCV: 91.4 fL (ref 80.0–100.0)
Monocytes Absolute: 0.8 10*3/uL (ref 0.1–1.0)
Monocytes Relative: 8 %
Neutro Abs: 8.1 10*3/uL — ABNORMAL HIGH (ref 1.7–7.7)
Neutrophils Relative %: 77 %
Platelets: 209 10*3/uL (ref 150–400)
RBC: 4.3 MIL/uL (ref 4.22–5.81)
RDW: 13.8 % (ref 11.5–15.5)
WBC: 10.6 10*3/uL — ABNORMAL HIGH (ref 4.0–10.5)
nRBC: 0 % (ref 0.0–0.2)

## 2021-12-30 LAB — GLUCOSE, CAPILLARY
Glucose-Capillary: 100 mg/dL — ABNORMAL HIGH (ref 70–99)
Glucose-Capillary: 89 mg/dL (ref 70–99)
Glucose-Capillary: 94 mg/dL (ref 70–99)
Glucose-Capillary: 94 mg/dL (ref 70–99)
Glucose-Capillary: 97 mg/dL (ref 70–99)
Glucose-Capillary: 97 mg/dL (ref 70–99)

## 2021-12-30 LAB — MAGNESIUM: Magnesium: 2 mg/dL (ref 1.7–2.4)

## 2021-12-30 LAB — D-DIMER, QUANTITATIVE: D-Dimer, Quant: 0.96 ug/mL-FEU — ABNORMAL HIGH (ref 0.00–0.50)

## 2021-12-30 LAB — LACTATE DEHYDROGENASE: LDH: 272 U/L — ABNORMAL HIGH (ref 98–192)

## 2021-12-30 LAB — PHOSPHORUS: Phosphorus: 4.3 mg/dL (ref 2.5–4.6)

## 2021-12-30 LAB — FERRITIN: Ferritin: 76 ng/mL (ref 24–336)

## 2021-12-30 LAB — C-REACTIVE PROTEIN: CRP: 0.5 mg/dL (ref <1.0)

## 2021-12-30 MED ORDER — LIP MEDEX EX OINT
TOPICAL_OINTMENT | CUTANEOUS | Status: DC | PRN
  Administered 2021-12-31: 1 via TOPICAL
  Filled 2021-12-30: qty 7

## 2021-12-30 MED ORDER — LORAZEPAM 2 MG/ML IJ SOLN
1.0000 mg | INTRAMUSCULAR | Status: DC | PRN
  Administered 2021-12-30: 1 mg via INTRAVENOUS
  Filled 2021-12-30: qty 1

## 2021-12-30 MED ORDER — HALOPERIDOL LACTATE 5 MG/ML IJ SOLN
2.0000 mg | Freq: Four times a day (QID) | INTRAMUSCULAR | Status: DC | PRN
  Administered 2021-12-30: 4 mg via INTRAVENOUS
  Filled 2021-12-30: qty 1

## 2021-12-30 MED ORDER — HALOPERIDOL LACTATE 5 MG/ML IJ SOLN
5.0000 mg | Freq: Four times a day (QID) | INTRAMUSCULAR | Status: DC
  Administered 2021-12-30 (×2): 5 mg via INTRAVENOUS
  Filled 2021-12-30 (×2): qty 1

## 2021-12-30 MED ORDER — DEXTROSE-NACL 5-0.9 % IV SOLN: INTRAVENOUS | Status: DC

## 2021-12-30 MED ORDER — PREDNISONE 20 MG PO TABS
40.0000 mg | ORAL_TABLET | Freq: Every day | ORAL | Status: DC
  Administered 2021-12-31 – 2022-01-01 (×2): 40 mg via ORAL
  Filled 2021-12-30 (×2): qty 2

## 2021-12-30 MED ORDER — HALOPERIDOL LACTATE 5 MG/ML IJ SOLN: 3.0000 mg | Freq: Four times a day (QID) | INTRAMUSCULAR | Status: DC | PRN
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
## 2021-12-30 NOTE — Progress Notes (Signed)
?   ? ? ? ? ?[facility] for Infectious Disease ? ?Date of Admission:  12/24/2021   Total days of inpatient antibiotics 6 ? ?Principal Problem: ?  Respiratory failure with hypoxia ([place]) ?Active Problems: ?  Malignant neoplasm of prostate ([place]) ?  Anxiety disorder ?  Tobacco use disorder ?  Hypercholesterolemia ?  Essential hypertension, benign ?  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[place]) ?  Chronic fatigue ?  Aneurysm of thoracic aorta (HCC) ?  COPD mixed type ([place]) ?  Trifascicular block ?  Coronary artery disease involving native coronary artery of native heart without angina pectoris ?  Sick sinus syndrome ([place]) ?  Pacemaker ?  Obesity with body mass index (BMI) of 30.0 to 39.9 ?  Prediabetes ?  OSA (obstructive sleep apnea) ?  Chronic respiratory failure with hypoxia (HCC) ?  COVID ?  Severe sepsis ([place]) ?  Transaminitis ?  Abdominal aortic aneurysm (AAA) 3.0 cm to 5.5 cm in diameter in male [facility]) ?     ?    ?Assessment: ?29 YM with COPD, OSA on CPAP qhs, prostate Ca with lymphadenopathy admitted for respiratory failure 2/2 COVID PNA. ?  ?#E coli bacteremia suspected 2/2 GI etiology ?#Elevated LFTs-trending down ?#Elevated t billi-resolved ?#Prostate Ca on Leuprolide and Zitliga  ?#Hx of Ecoli bacteremia  on 07/19/20(suspect 2/2 UTI-urine negative) ?Although elevated LFTs/ tbilli can be seen with COVID. Given he has gallbladder would suspected bacteremia 2/2 intraabdominal source. He has no urinary complaints(Urine Cx negative-obtained after 1 day of antibiotics) ?-Abdominal US showed stones vs sludge in gallbladder. CT AP showed cholelithiasis w/ cholecystitis(CT was done given Hx of prostate cancer). MRCP showed cholelithiasis and normal CBD ?Recommendations:  ?-Continue ceftriaxone and metronidazole. Transition to bactrim + metronidazole on discharge to complete 2 weeks of treatment EOT 01/06/22 ?-Although no cholecystitis seen on imaging, cholelithiasis present. Given this his 2nd episode of  Ecoli bacteremia 2/2 unclear source would follow-up with GI for management of gallbladder.  ? ? ?  ?#Acute on chronic respiratory failure 2/2 COVID infection ?# CPAP qhs at baseline ?-Pt received booster this year ?-Symptoms started Tuesday night ?-Initially baricitinib(4/12) started but was stopped due to bacteremia(immunosuppression). LFTs were above threshold to start remdesivir.  ?Recommendations ?-Pt on room air today 12/30/21 ?-Continue dexamethasone to complete 10d ? ? ? ?Microbiology:   ?Antibiotics: ?Cefepime 4/12 ?Metronidazole 4/12-p ?Vancomycin 4/12 ?Ceftriaxone 4/13-p ?  ?Cultures: ?Blood ?4/12 2/2 Ecoli ? 4/13 Urine Cx NG ? ?SUBJECTIVE: ?Resting in bed. Wife and daughter at bedside.  ? ?Review of Systems: ?Review of Systems  ?All other systems reviewed and are negative. ? ? ?Scheduled Meds: ? abiraterone acetate  250 mg Oral Daily  ? vitamin C  500 mg Oral Daily  ? aspirin EC  81 mg Oral Daily  ? Chlorhexidine Gluconate Cloth  6 each Topical Daily  ? citalopram  10 mg Oral Daily  ? docusate sodium  100 mg Oral Q supper  ? enoxaparin (LOVENOX) injection  40 mg Subcutaneous C12X  ? folic acid  1 mg Oral Daily  ? haloperidol lactate  5 mg Intravenous Q6H  ? insulin aspart  0-9 Units Subcutaneous Q4H  ? lisinopril  5 mg Oral Daily  ? mouth rinse  15 mL Mouth Rinse BID  ? metoprolol tartrate  12.5 mg Oral BID  ? mometasone-formoterol  2 puff Inhalation BID  ? multivitamin with minerals  1 tablet Oral Daily  ? pantoprazole  40 mg Oral Daily  ? predniSONE  5 mg Oral Q breakfast  ? sodium chloride flush  3 mL Intravenous Q12H  ? thiamine  100 mg Oral Daily  ? zinc sulfate  220 mg Oral Daily  ? ?Continuous Infusions: ? sodium chloride Stopped (12/28/21 2259)  ? cefTRIAXone (ROCEPHIN)  IV Stopped (12/30/21 0930)  ? metronidazole 500 mg (12/30/21 1017)  ? ?PRN Meds:.sodium chloride, acetaminophen, albuterol, chlorpheniramine-HYDROcodone, guaiFENesin-dextromethorphan, haloperidol lactate, LORazepam, LORazepam,  ondansetron **OR** ondansetron (ZOFRAN) IV ?Allergies  ?Allergen Reactions  ? Penicillins Rash  ?  Has patient had a PCN reaction causing immediate rash, facial/tongue/throat swelling, SOB or lightheadedness with hypotension: Yes ?Has patient had a PCN reaction causing severe rash involving mucus membranes or skin necrosis: No ?Has patient had a PCN reaction that required hospitalization: No ?Has patient had a PCN reaction occurring within the last 10 years: No ?If all of the above answers are "NO", then may proceed with Cephalosporin use.  ? ? ?OBJECTIVE: ?Vitals:  ? 12/30/21 0518 12/30/21 0600 12/30/21 0700 12/30/21 0753  ?BP:    (!) 157/99  ?Pulse:    60  ?Resp: '17 20 18 17  '$ ?Temp:    97.6 ?F (36.4 ?C)  ?TempSrc:    Axillary  ?SpO2:    90%  ?Weight:      ?Height:      ? ?Body mass index is 35.2 kg/m?. ? ?Physical Exam ?Constitutional:   ?   General: He is not in acute distress. ?   Appearance: He is normal weight. He is not toxic-appearing.  ?HENT:  ?   Head: Normocephalic and atraumatic.  ?   Right Ear: External ear normal.  ?   Left Ear: External ear normal.  ?   Nose: No congestion or rhinorrhea.  ?   Mouth/Throat:  ?   Mouth: Mucous membranes are moist.  ?   Pharynx: Oropharynx is clear.  ?Eyes:  ?   Extraocular Movements: Extraocular movements intact.  ?   Conjunctiva/sclera: Conjunctivae normal.  ?   Pupils: Pupils are equal, round, and reactive to light.  ?Cardiovascular:  ?   Rate and Rhythm: Normal rate and regular rhythm.  ?   Heart sounds: No murmur heard. ?  No friction rub. No gallop.  ?Pulmonary:  ?   Effort: Pulmonary effort is normal.  ?   Breath sounds: Normal breath sounds.  ?Abdominal:  ?   General: Abdomen is flat. Bowel sounds are normal.  ?   Palpations: Abdomen is soft.  ?Musculoskeletal:     ?   General: No swelling.  ?   Cervical back: Normal range of motion and neck supple.  ?Skin: ?   General: Skin is warm and dry.  ?Psychiatric:     ?   Mood and Affect: Mood normal.  ? ? ? ? ?Lab  Results ?Lab Results  ?Component Value Date  ? WBC 10.6 (H) 12/30/2021  ? HGB 13.4 12/30/2021  ? HCT 39.3 12/30/2021  ? MCV 91.4 12/30/2021  ? PLT 209 12/30/2021  ?  ?Lab Results  ?Component Value Date  ? CREATININE 0.86 12/30/2021  ? BUN 20 12/30/2021  ? NA 135 12/30/2021  ? K 4.6 12/30/2021  ? CL 104 12/30/2021  ? CO2 20 (L) 12/30/2021  ?  ?Lab Results  ?Component Value Date  ? ALT 112 (H) 12/30/2021  ? AST 54 (H) 12/30/2021  ? ALKPHOS 78 12/30/2021  ? BILITOT 1.1 12/30/2021  ?  ? ? ? ? ?Laurice Record, MD ?[facility] for Infectious Disease ?[facility] ?12/30/2021, 11:45 AM  ?
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
## 2021-12-30 NOTE — Progress Notes (Signed)
Pt's output from straight cath was 1200 mL. MD made aware.  ?

## 2021-12-30 NOTE — Progress Notes (Signed)
Remote pacemaker transmission.   

## 2021-12-30 NOTE — Progress Notes (Signed)
Pt VS are stable. Pt currently in restraints no BIPAP at this time. RT will monitor ?

## 2021-12-31 ENCOUNTER — Ambulatory Visit (HOSPITAL_COMMUNITY)
Admission: RE | Admit: 2021-12-31 | Discharge: 2021-12-31 | Disposition: A | Discharge status: Home / Self Care | Payer: Medicare Other | Source: Ambulatory Visit | Attending: Gastroenterology | Admitting: Gastroenterology

## 2021-12-31 ENCOUNTER — Other Ambulatory Visit (HOSPITAL_COMMUNITY): Payer: Self-pay

## 2021-12-31 ENCOUNTER — Encounter (HOSPITAL_COMMUNITY): Payer: Self-pay

## 2021-12-31 DIAGNOSIS — U071 COVID-19: Secondary | ICD-10-CM | POA: Diagnosis not present

## 2021-12-31 DIAGNOSIS — J9601 Acute respiratory failure with hypoxia: Secondary | ICD-10-CM | POA: Diagnosis not present

## 2021-12-31 DIAGNOSIS — I714 Abdominal aortic aneurysm, without rupture, unspecified: Secondary | ICD-10-CM | POA: Diagnosis not present

## 2021-12-31 LAB — C-REACTIVE PROTEIN: CRP: 0.6 mg/dL (ref <1.0)

## 2021-12-31 LAB — CBC WITH DIFFERENTIAL/PLATELET
Abs Immature Granulocytes: 0.37 10*3/uL — ABNORMAL HIGH (ref 0.00–0.07)
Basophils Absolute: 0 10*3/uL (ref 0.0–0.1)
Basophils Relative: 0 %
Eosinophils Absolute: 0.2 10*3/uL (ref 0.0–0.5)
Eosinophils Relative: 3 %
HCT: 37.5 % — ABNORMAL LOW (ref 39.0–52.0)
Hemoglobin: 12.3 g/dL — ABNORMAL LOW (ref 13.0–17.0)
Immature Granulocytes: 4 %
Lymphocytes Relative: 13 %
Lymphs Abs: 1.1 10*3/uL (ref 0.7–4.0)
MCH: 31.1 pg (ref 26.0–34.0)
MCHC: 32.8 g/dL (ref 30.0–36.0)
MCV: 94.7 fL (ref 80.0–100.0)
Monocytes Absolute: 0.6 10*3/uL (ref 0.1–1.0)
Monocytes Relative: 7 %
Neutro Abs: 6.1 10*3/uL (ref 1.7–7.7)
Neutrophils Relative %: 73 %
Platelets: 221 10*3/uL (ref 150–400)
RBC: 3.96 MIL/uL — ABNORMAL LOW (ref 4.22–5.81)
RDW: 14 % (ref 11.5–15.5)
WBC: 8.5 10*3/uL (ref 4.0–10.5)
nRBC: 0 % (ref 0.0–0.2)

## 2021-12-31 LAB — GLUCOSE, CAPILLARY
Glucose-Capillary: 96 mg/dL (ref 70–99)
Glucose-Capillary: 96 mg/dL (ref 70–99)
Glucose-Capillary: 115 mg/dL — ABNORMAL HIGH (ref 70–99)
Glucose-Capillary: 141 mg/dL — ABNORMAL HIGH (ref 70–99)
Glucose-Capillary: 179 mg/dL — ABNORMAL HIGH (ref 70–99)
Glucose-Capillary: 117 mg/dL — ABNORMAL HIGH (ref 70–99)

## 2021-12-31 LAB — COMPREHENSIVE METABOLIC PANEL
ALT: 87 U/L — ABNORMAL HIGH (ref 0–44)
AST: 41 U/L (ref 15–41)
Albumin: 2.9 g/dL — ABNORMAL LOW (ref 3.5–5.0)
Alkaline Phosphatase: 72 U/L (ref 38–126)
Anion gap: 6 (ref 5–15)
BUN: 18 mg/dL (ref 8–23)
CO2: 22 mmol/L (ref 22–32)
Calcium: 8.2 mg/dL — ABNORMAL LOW (ref 8.9–10.3)
Chloride: 108 mmol/L (ref 98–111)
Creatinine, Ser: 0.84 mg/dL (ref 0.61–1.24)
GFR, Estimated: >60 mL/min (ref >60)
Glucose, Bld: 91 mg/dL (ref 70–99)
Potassium: 3.8 mmol/L (ref 3.5–5.1)
Sodium: 136 mmol/L (ref 135–145)
Total Bilirubin: 1.2 mg/dL (ref 0.3–1.2)
Total Protein: 5.3 g/dL — ABNORMAL LOW (ref 6.5–8.1)

## 2021-12-31 LAB — D-DIMER, QUANTITATIVE: D-Dimer, Quant: 0.87 ug/mL-FEU — ABNORMAL HIGH (ref 0.00–0.50)

## 2021-12-31 LAB — PHOSPHORUS: Phosphorus: 4.1 mg/dL (ref 2.5–4.6)

## 2021-12-31 LAB — LACTATE DEHYDROGENASE: LDH: 162 U/L (ref 98–192)

## 2021-12-31 LAB — FERRITIN: Ferritin: 69 ng/mL (ref 24–336)

## 2021-12-31 LAB — MAGNESIUM: Magnesium: 1.9 mg/dL (ref 1.7–2.4)

## 2021-12-31 MED ORDER — INSULIN ASPART 100 UNIT/ML IJ SOLN: 0.0000 [IU] | Freq: Three times a day (TID) | INTRAMUSCULAR | Status: DC

## 2021-12-31 MED ORDER — INSULIN ASPART 100 UNIT/ML IJ SOLN
0.0000 [IU] | Freq: Three times a day (TID) | INTRAMUSCULAR | Status: DC
  Administered 2021-12-31: 2 [IU] via SUBCUTANEOUS

## 2021-12-31 MED ORDER — LIDOCAINE HCL URETHRAL/MUCOSAL 2 % EX GEL
1.0000 "application " | Freq: Once | CUTANEOUS | Status: DC
  Filled 2021-12-31: qty 6

## 2021-12-31 NOTE — Progress Notes (Signed)
?PROGRESS NOTE ? ? ? Tony [name]  JGG:[id number] DOB: July 04, 1941 DOA: 12/24/2021 ?PCP: [name], [name], MD  ? ?Brief Narrative:  ?81 y.o. WM PMHx anxiety DO, thoracic aortic aneurysm, prostate cancer, tobacco abuse, essential HTN, trifascicular block, CAD, sick sinus syndrome, s/p pacemaker, chronic respiratory failure with hypoxia, COPD mixed type, OSA ? ?Presented to [facility] with a chief complaint of shortness of breath and generalized weakness ?  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  ?He developed shortness of breath and generalized weakness overnight on 4/11 - 4/12.  Denies nausea, vomiting, diarrhea ?  ?ED course was notable for BP 84/58, initial oxygen saturation of 79%, and HR 135.  A code sepsis was initiated.  2 L of IV fluid and blood cultures were ordered, broad-spectrum antibiotics started, patient placed on BiPAP.  The patient was found to be COVID-positive.   ? ? ?Subjective: ? ?Patient today denies any dyspnea, reports mild cough, he denies any abdominal pain, nausea or vomiting, he had some urine retention overnight but this has currently resolved. ? ?  ?Assessment & Plan: ? Covid vaccination; ? ?Principal Problem: ?  Respiratory failure with hypoxia ([place]) ?Active Problems: ?  Aneurysm of thoracic aorta (HCC) ?  COPD mixed type ([place]) ?  Malignant neoplasm of prostate ([place]) ?  Anxiety disorder ?  Tobacco use disorder ?  Hypercholesterolemia ?  Essential hypertension, benign ?  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[place]) ?  Chronic fatigue ?  Trifascicular block ?  Coronary artery disease involving native coronary artery of native heart without angina pectoris ?  Sick sinus syndrome ([place]) ?  Pacemaker ?  Obesity with body mass index (BMI) of 30.0 to 39.9 ?  Prediabetes ?  OSA (obstructive sleep apnea) ?  Chronic respiratory failure with  hypoxia (HCC) ?  COVID ?  Severe sepsis ([place]) ?  Transaminitis ?  Abdominal aortic aneurysm (AAA) 3.0 cm to 5.5 cm in diameter in male [facility]) ? ?COVID pneumonia ? ?COVID-19 Labs ? ?Recent Labs  ?  12/29/21 ?0052 12/30/21 ?0117 12/31/21 ?0213  ?DDIMER 0.79* 0.96* 0.87*  ?FERRITIN 70 76 69  ?LDH 182 272* 162  ?CRP 0.8 0.5 0.6  ? ? ?Lab Results  ?Component Value Date  ? SARSCOV2NAA POSITIVE (A) 12/24/2021  ? [place] NEGATIVE 09/27/2021  ? [place] NEGATIVE 09/19/2021  ? [place] NEGATIVE 07/13/2021  ? [place] NEGATIVE 07/13/2021  ? ?   ? ?Acute respiratory failure due to COVID 19 viral pneumonia ?-No evidence of PE. ?-Elevated LFTs, so did not receive remdesivir ?-Baricitinib has been discontinued due to bacteremia ?-He is on room air today, encouraged use incentive spirometry and flutter valve ?-Continue with Decadron. ? ?COPD, not acutely exacerbated ?-See acute respiratory failure ? ?OSA ?-BiPAP per respiratory settings ? ?Sepsis positive E. coli bacteremia; concerned for biliary source of infection ?-ID input greatly appreciated, given ultrasound showing stones versus sludge in gallbladder, MRCP showing cholelithiasis without choledocholithiasis or cholecystitis. ?-Patient to follow with his primary GI Dr  Watt Climes regarding this, appointment has been scheduled for 01/14/2022 ?-New with Rocephin and metronidazole during hospital stay, transition to Bactrim and metronidazole on discharge to complete total of 2 weeks until 01/06/2022 ? ?Abdominal aortic aneurysm ?- 4/14 CT abdomen:4.2 cm infrarenal abdominal aortic aneurysm ?-4/15 patient states he has a CT surgeon who follows his AAA. ? ?Lactic acidosis ?-resolved ?  ?Essential HTN,  ?-  Target BP <120/80 given patient's AAA ?-Metoprolol 12.5 mg BID ?-4/17 lisinopril 5 mg daily ? ?Sick sinus syndrome and trifascicular block s/p pacemaker placement ? ? ?Infrarenal AAA ?Last ECHO 10/22 EF 60-65% ?-telemetry monitoring ?-con't daily aspirin ?-holding PTA  antihypertensives, lasix ?-holding PTA atorvastatin with elevated transaminases levels ?-OP surveillance for AAA ?- ? ?HLD ?-4/17 lipid panel pending ?  ?Hyperbilirubinemia disproportionate to elevated transaminase levels ?-RUQ [place] to eval GB ?-liver US with doppler to r/o hepatic vein thrombus due to hypercoagulability with covid ?-Liver enzymes improving ? ?Cholelithiasis ?- 4/15 negative obstruction see CT findings below ?-4/18 per ID and GI follow-up with Dr. Watt Climes GI 2 weeks following discharge from hospital, discussion of cholecystectomy ?  ?Transaminitis  ?AST 1288, ALT 643, bilirubin 2.8, being seen outpatient for issues with jaundice.  Acute on chronic with sepsis worsening currently. ?-Monitor ?-Supportive care as above ?-Hepatitis panel negative ?  ?Anxiety ?-Con't celexa ?-con't PTA ativan PRN (hold) secondary to AMS in an older gentleman. ? ?Altered mental status ?-4/18 Haldol IV 3 mg  QID PRN agitation/violence towards staff. ?-4/18 patient received medication last night to obtain MRCP.  Try to titrate patient off sedating medication.  Prior to receiving last night medication had been A/O x4 ? ?  ?GERD ?-con't PPI, which is a home med ? ?  ?Hyperglycemia ?-4/14 hemoglobin A1c= 5.9 ?Sensitive SSI ? ?History of prostate cancer; s{ radiation, seed implant brachytherapy, androgen deprivation therapy ?-Zytiga 250 mg daily ?- 4/18 restart Prednisone 40 mg daily; at day 10,  return to Prednisone 5 mg daily which is his home dose ? ?Obese (BMI 35.63 kg/m?.) ?- ? ?Goals of care ?- 4/16 PT/OT consult COVID pneumonia, deconditioning evaluate for CIR vs SNF ? ? ? ?Mobility Assessment (last 72 hours)   ? ? Mobility Assessment   ? ? [place] Name 12/30/21 2000 12/29/21 2000 12/29/21 1304 12/29/21 1000  ?  ? Does patient have an order for bedrest or is patient medically unstable No - Continue assessment No - Continue assessment -- --   ? What is the highest level of mobility based on the progressive mobility assessment?  Level 5 (Walks with assist in room/hall) - Balance while stepping forward/back and can walk in room with assist - Complete Level 5 (Walks with assist in room/hall) - Balance while stepping forward/back and can walk in room with assist - Complete Level 5 (Walks with assist in room/hall) - Balance while stepping forward/back and can walk in room with assist - Complete Level 5 (Walks with assist in room/hall) - Balance while stepping forward/back and can walk in room with assist - Complete   ? ?  ?  ? ?  ? ? ?DVT prophylaxis: Lovenox ?Code Status: Full ?Family Communication: Discussed with wife at bedside ? ? ? ?Dispo: The patient is from: Home ?             Anticipated d/c is to: Home ?             Anticipated d/c date is: 1 day ?             Patient currently is not medically stable to d/c. ? ? ? ? ? ?Consultants:  ?PCCM ?ID ? ?Procedures/Significant Events:  ?4/12 presented to [facility], PCCM consult, COVID+, BC>, UA>, started on heparin empirically due to concern for PE, started on cefepime, vanc, flagyl ?4/13 US abdomen Limited RUQ ?Small dependent stones versus sludge in the gallbladder. But no ?evidence of acute cholecystitis or bile  duct obstruction. ?2. Negative ultrasound appearance of the liver. ?4/13 US liver Doppler ?Suboptimal evaluation, with poor acoustic penetration secondary to ?patient habitus. ?1. Normal liver Doppler evaluation ?2. Incidental senescent renal change, with diffuse RIGHT renal ?cortical thinning. ?4/14 CT abdomen pelvis W contrast ?Dependent atelectasis/consolidation in the visualized lung bases ?with trace effusions. ?2. Coronary and Aortic Atherosclerosis (ICD10-170.0). ?3. Cholelithiasis without CT evidence of cholecystitis or biliary ?obstruction. ?4. 4.2 cm infrarenal abdominal aortic aneurysm. Recommend follow-up ?every 12 months and vascular consultation ?4/17 MR abdomen MRCP W/ Wo contrast ?Cholelithiasis. Normal caliber common bile duct with no evidence  of ?choledocholithiasis. ? ? ?I have personally reviewed and interpreted all radiology studies and my findings are as above. ? ?VENTILATOR SETTINGS: ?Room air 4/18 ?SPO2 88% ? ? ?Cultures ?4/12 RIGHT AC positive E. coli ?4/12 RIGHT wrist

## 2021-12-31 NOTE — Progress Notes (Signed)
Physical Therapy Treatment ?Patient Details ?Name: Tony [name] ?MRN: [id number] ?DOB: Dec 21, 1940 ?Today's Date: 12/31/2021 ? ? ?History of Present Illness 81 yo male presenting to ED with with SOB and hypotension. Tested COVID +. 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 ? ?  ?PT Comments  ? ? Patient progressing well towards PT goals. Requires min A to stand from EOB and tolerated gait training with min guard assist and use of RW for support. Pt with 2/4 DOE with activity and needed 1 standing rest break during ambulation. Sp02 remained >93% on RA with activity. Pt reports he lives with family and his children work, but his wife can provide the necessary assist at home. Encouraged increasing activity level while in the hospital to prevent deconditioning. Will continue to follow and progress.  ?  ?Recommendations for follow up therapy are one component of a multi-disciplinary discharge planning process, led by the attending physician.  Recommendations may be updated based on patient status, additional functional criteria and insurance authorization. ? ?Follow Up Recommendations ? Home health PT ?  ?  ?Assistance Recommended at Discharge Intermittent Supervision/Assistance  ?Patient can return home with the following A little help with walking and/or transfers;A little help with bathing/dressing/bathroom;Assistance with cooking/housework;Assist for transportation;Help with stairs or ramp for entrance ?  ?Equipment Recommendations ? None recommended by PT  ?  ?Recommendations for Other Services   ? ? ?  ?Precautions / Restrictions Precautions ?Precautions: Fall;Other (comment) ?Precaution Comments: COVID ?Restrictions ?Weight Bearing Restrictions: No  ?  ? ?Mobility ? Bed Mobility ?Overal bed mobility: Needs Assistance ?Bed Mobility: Supine to Sit ?  ?  ?Supine to sit: Supervision, HOB elevated ?  ?   ?General bed mobility comments: No assist needed. Use of rail. ?  ? ?Transfers ?Overall transfer level: Needs assistance ?Equipment used: Rolling walker (2 wheels) ?Transfers: Sit to/from Stand ?Sit to Stand: Min assist ?  ?  ?  ?  ?  ?General transfer comment: Min A to power to standing wtih cues for hand placement/technique as pt pulling up on RW. ?  ? ?Ambulation/Gait ?Ambulation/Gait assistance: Min guard ?Gait Distance (Feet): 120 Feet ?Assistive device: Rolling walker (2 wheels) ?Gait Pattern/deviations: Step-through pattern, Decreased stride length ?Gait velocity: decreased ?  ?  ?General Gait Details: Slow, mostly steady gait with 2/4 DOE. 1 standing rest break. Sp02 >93% on RA. ? ? ?Stairs ?  ?  ?  ?  ?  ? ? ?Wheelchair Mobility ?  ? ?Modified Rankin (Stroke Patients Only) ?  ? ? ?  ?Balance Overall balance assessment: Needs assistance ?Sitting-balance support: Feet supported, No upper extremity supported ?Sitting balance-Leahy Scale: Good ?  ?  ?Standing balance support: During functional activity ?Standing balance-Leahy Scale: Fair ?Standing balance comment: Needs UE support for walking ?  ?  ?  ?  ?  ?  ?  ?  ?  ?  ?  ?  ? ?  ?Cognition Arousal/Alertness: Awake/alert ?Behavior During Therapy: [facility] for tasks assessed/performed ?Overall Cognitive Status: Within Functional Limits for tasks assessed ?  ?  ?  ?  ?  ?  ?  ?  ?  ?  ?  ?  ?  ?  ?  ?  ?  ?  ?  ? ?  ?Exercises   ? ?  ?General Comments General comments (skin integrity, edema, etc.): Sp02 >93% on RA with activity. ?  ?  ? ?  Pertinent Vitals/Pain Pain Assessment ?Pain Assessment: No/denies pain  ? ? ?Home Living   ?  ?  ?  ?  ?  ?  ?  ?  ?  ?   ?  ?Prior Function    ?  ?  ?   ? ?PT Goals (current goals can now be found in the care plan section) Progress towards PT goals: Progressing toward goals ? ?  ?Frequency ? ? ? Min 3X/week ? ? ? ?  ?PT Plan Current plan remains appropriate  ? ? ?Co-evaluation   ?  ?  ?  ?  ? ?  ?AM-PAC PT "6 Clicks" Mobility    ?Outcome Measure ? Help needed turning from your back to your side while in a flat bed without using bedrails?: None ?Help needed moving from lying on your back to sitting on the side of a flat bed without using bedrails?: A Little ?Help needed moving to and from a bed to a chair (including a wheelchair)?: A Little ?Help needed standing up from a chair using your arms (e.g., wheelchair or bedside chair)?: A Little ?Help needed to walk in hospital room?: A Little ?Help needed climbing 3-5 steps with a railing? : A Lot ?6 Click Score: 18 ? ?  ?End of Session Equipment Utilized During Treatment: Gait belt ?Activity Tolerance: Patient tolerated treatment well ?Patient left: in bed;with call bell/phone within reach (sitting EOB) ?Nurse Communication: Mobility status ?PT Visit Diagnosis: Unsteadiness on feet (R26.81);Muscle weakness (generalized) (M62.81);Difficulty in walking, not elsewhere classified (R26.2) ?  ? ? ?Time: 1610-9604 ?PT Time Calculation (min) (ACUTE ONLY): 23 min ? ?Charges:  $Gait Training: 8-22 mins ?$Therapeutic Activity: 8-22 mins          ?          ? ?Marisa Severin, PT, DPT ?Acute Rehabilitation Services ?Secure chat preferred ?Office [phone] ? ? ? ? ? ?[place] ?12/31/2021, 3:47 PM ? ?

## 2021-12-31 NOTE — TOC Initial Note (Signed)
Transition of Care (TOC) - Initial/Assessment Note  ? ? ?Patient Details  ?Name: Tony [name] ?MRN: [id number] ?Date of Birth: Jun 04, 1941 ? ?Transition of Care (TOC) CM/SW Contact:    ?Cyndi Bender, RN ?Phone Number: ?12/31/2021, 11:53 AM ? ?Clinical Narrative:         ?Spoke to patient's wife regarding transitions needs. Patient lives with spouse and has a walker and 3&1. Wife can transport home once discharged. Orders for Home health pt/ot. Wife, [name], deferred to [facility] to find highly rated agency. Spoke to [place] with [facility] and referral accepted.  ?TOC will continue to follow for needs ? ? ?Expected Discharge Plan: [place] ?Barriers to Discharge: Continued Medical Work up ? ? ?Patient Goals and CMS Choice ?Patient states their goals for this hospitalization and ongoing recovery are:: return home ?CMS Medicare.gov Compare Post Acute Care list provided to:: Patient Represenative (must comment) ?Choice offered to / list presented to : Spouse ? ?Expected Discharge Plan and Services ?Expected Discharge Plan: [place] ?  ?Discharge Planning Services: CM Consult ?Post Acute Care Choice: Home Health ?Living arrangements for the past 2 months: [place] ?                ?  ?  ?  ?  ?  ?HH Arranged: PT, OT ?[place] Agency: [place] ?Date HH Agency Contacted: 12/31/21 ?Time [place]: 1017 ?Representative spoke with at [place]: Malachy Mood ? ?Prior Living Arrangements/Services ?Living arrangements for the past 2 months: [place] ?Lives with:: Spouse ?Patient language and need for interpreter reviewed:: Yes ?Do you feel safe going back to the place where you live?: Yes      ?Need for Family Participation in Patient Care: Yes (Comment) ?Care giver support system in place?: Yes (comment) ?Current home services: DME (walker, 3&1) ?Criminal Activity/Legal Involvement Pertinent to Current Situation/Hospitalization: No - Comment as  needed ? ?Activities of Daily Living ?Home Assistive Devices/Equipment: None, Blood pressure cuff, Built-in shower seat, Cane (specify quad or straight), CPAP, Dentures (specify type), Eyeglasses, Grab bars in shower, Hand-held shower hose, Walker (specify type) (Top/Bottom Dentures ; Counsellor) ?ADL Screening (condition at time of admission) ?Patient's cognitive ability adequate to safely complete daily activities?: Yes ?Is the patient deaf or have difficulty hearing?: No ?Does the patient have difficulty seeing, even when wearing glasses/contacts?: No ?Does the patient have difficulty concentrating, remembering, or making decisions?: No ?Patient able to express need for assistance with ADLs?: Yes ?Does the patient have difficulty dressing or bathing?: Yes ?Independently performs ADLs?: Yes (appropriate for developmental age) ?Does the patient have difficulty walking or climbing stairs?: Yes ?Weakness of Legs: None ?Weakness of Arms/Hands: None ? ?Permission Sought/Granted ?Permission sought to share information with : Case Manager ?Permission granted to share information with : Yes, Verbal Permission Granted ?   ? Permission granted to share info w AGENCY: Home health ?   ?   ? ?Emotional Assessment ?  ?  ?  ?  ?Alcohol / Substance Use: Not Applicable ?Psych Involvement: No (comment) ? ?Admission diagnosis:  Hypoxia [R09.02] ?Respiratory failure with hypoxia ([place]) [J96.91] ?Hypotension, unspecified hypotension type [I95.9] ?Sepsis, due to unspecified organism, unspecified whether acute organ dysfunction present ([place]) [A41.9] ?COVID [U07.1] ?Patient Active Problem List  ? Diagnosis Date Noted  ? Abdominal aortic aneurysm (AAA) 3.0 cm to 5.5 cm in diameter in male [facility]) 12/26/2021  ? Respiratory failure with hypoxia ([place]) 12/24/2021  ?  COVID 12/24/2021  ? Severe sepsis ([place])   ? Transaminitis   ? Chronic respiratory failure with hypoxia ([place]) 12/16/2021  ? Dysphagia 08/18/2021  ? Dyspnea 07/13/2021  ?  Atherosclerosis of aorta ([place]) 06/20/2021  ? OSA (obstructive sleep apnea) 07/25/2020  ? Senile ecchymosis 05/21/2020  ? Obesity with body mass index (BMI) of 30.0 to 39.9 11/14/2019  ? Prediabetes 11/14/2019  ? Varicose veins of both lower extremities 05/17/2019  ? Bilateral lower extremity edema 03/13/2019  ? Hypokalemia 03/13/2019  ? 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  ? SSS (sick sinus syndrome) ([place]) 06/14/2018  ? 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  ? Coronary artery disease involving native coronary artery of native heart without angina pectoris 06/08/2018  ? Sick sinus syndrome ([place]) 06/08/2018  ? Aneurysm of thoracic aorta ([place]) 02/03/2018  ? 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 COPD mixed type ([place]) 02/03/2018  ? 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  ? Acute appendicitis 01/21/2018  ? Perforated appendicitis 01/21/2018  ? Chronic fatigue 06/01/2017  ? Essential hypertension, benign 07/09/2016  ? 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[place]) 07/06/2016  ? Coronary artery calcification seen on CAT scan 05/15/2016  ? Anxiety disorder 04/08/2016  ? Insomnia 04/08/2016  ? Atherosclerosis of coronary artery without angina pectoris 04/08/2016  ? Tobacco use disorder 04/08/2016  ? Hypercholesterolemia 04/08/2016  ? Malignant neoplasm of prostate ([place]) 02/23/2014  ? ?PCP:  [name], [name], MD ?Pharmacy:   ?[id number] [place], [place] - [id number]?Phone: [phone]: [phone]? ?[facility]?Phone: [phone]: [phone]? ? ? ? ?Social Determinants of Health (SDOH) Interventions ?  ? ?Readmission Risk Interventions ?   ? View : No data to display.  ?  ?  ?  ? ? ? ?

## 2021-12-31 NOTE — Progress Notes (Signed)
WIFE AND PATIENT DO NOT WANT TO BE DISTURBED AND PT UNABLE TO VOID , DECLINED IN AND OUT CATH TILL AM . ASYMPTOMATIC AT THIS TIME. MD CALLED AND OK TO WAIT TILL AM TO PLACE FOLEY INSTEAD OF IN AND OUT CATH ?

## 2022-01-01 ENCOUNTER — Other Ambulatory Visit (HOSPITAL_COMMUNITY): Payer: Self-pay

## 2022-01-01 ENCOUNTER — Telehealth: Payer: Self-pay | Admitting: Oncology

## 2022-01-01 DIAGNOSIS — U071 COVID-19: Secondary | ICD-10-CM | POA: Diagnosis not present

## 2022-01-01 DIAGNOSIS — J9601 Acute respiratory failure with hypoxia: Secondary | ICD-10-CM | POA: Diagnosis not present

## 2022-01-01 DIAGNOSIS — I714 Abdominal aortic aneurysm, without rupture, unspecified: Secondary | ICD-10-CM | POA: Diagnosis not present

## 2022-01-01 DIAGNOSIS — R7881 Bacteremia: Secondary | ICD-10-CM | POA: Diagnosis not present

## 2022-01-01 LAB — CBC WITH DIFFERENTIAL/PLATELET
Abs Immature Granulocytes: 0.47 10*3/uL — ABNORMAL HIGH (ref 0.00–0.07)
Basophils Absolute: 0.1 10*3/uL (ref 0.0–0.1)
Basophils Relative: 1 %
Eosinophils Absolute: 0 10*3/uL (ref 0.0–0.5)
Eosinophils Relative: 0 %
HCT: 38.5 % — ABNORMAL LOW (ref 39.0–52.0)
Hemoglobin: 12.6 g/dL — ABNORMAL LOW (ref 13.0–17.0)
Immature Granulocytes: 5 %
Lymphocytes Relative: 9 %
Lymphs Abs: 0.8 10*3/uL (ref 0.7–4.0)
MCH: 31 pg (ref 26.0–34.0)
MCHC: 32.7 g/dL (ref 30.0–36.0)
MCV: 94.6 fL (ref 80.0–100.0)
Monocytes Absolute: 0.6 10*3/uL (ref 0.1–1.0)
Monocytes Relative: 6 %
Neutro Abs: 7 10*3/uL (ref 1.7–7.7)
Neutrophils Relative %: 79 %
Platelets: 245 10*3/uL (ref 150–400)
RBC: 4.07 MIL/uL — ABNORMAL LOW (ref 4.22–5.81)
RDW: 14 % (ref 11.5–15.5)
WBC: 8.9 10*3/uL (ref 4.0–10.5)
nRBC: 0 % (ref 0.0–0.2)

## 2022-01-01 LAB — COMPREHENSIVE METABOLIC PANEL
ALT: 70 U/L — ABNORMAL HIGH (ref 0–44)
AST: 30 U/L (ref 15–41)
Albumin: 2.9 g/dL — ABNORMAL LOW (ref 3.5–5.0)
Alkaline Phosphatase: 70 U/L (ref 38–126)
Anion gap: 8 (ref 5–15)
BUN: 13 mg/dL (ref 8–23)
CO2: 20 mmol/L — ABNORMAL LOW (ref 22–32)
Calcium: 8.5 mg/dL — ABNORMAL LOW (ref 8.9–10.3)
Chloride: 109 mmol/L (ref 98–111)
Creatinine, Ser: 0.77 mg/dL (ref 0.61–1.24)
GFR, Estimated: >60 mL/min (ref >60)
Glucose, Bld: 123 mg/dL — ABNORMAL HIGH (ref 70–99)
Potassium: 4.1 mmol/L (ref 3.5–5.1)
Sodium: 137 mmol/L (ref 135–145)
Total Bilirubin: 0.7 mg/dL (ref 0.3–1.2)
Total Protein: 5.6 g/dL — ABNORMAL LOW (ref 6.5–8.1)

## 2022-01-01 LAB — GLUCOSE, CAPILLARY: Glucose-Capillary: 120 mg/dL — ABNORMAL HIGH (ref 70–99)

## 2022-01-01 MED ORDER — METRONIDAZOLE 500 MG PO TABS
500.0000 mg | ORAL_TABLET | Freq: Two times a day (BID) | ORAL | 0 refills | Status: AC
  Filled 2022-01-01: qty 10, 5d supply, fill #0

## 2022-01-01 MED ORDER — DEXAMETHASONE 6 MG PO TABS
6.0000 mg | ORAL_TABLET | Freq: Every day | ORAL | 0 refills | Status: AC
  Filled 2022-01-01: qty 3, 3d supply, fill #0

## 2022-01-01 MED ORDER — PREDNISONE 5 MG PO TABS: 5.0000 mg | ORAL_TABLET | Freq: Every day | ORAL | 3 refills | Status: DC

## 2022-01-01 MED ORDER — SULFAMETHOXAZOLE-TRIMETHOPRIM 800-160 MG PO TABS
1.0000 | ORAL_TABLET | Freq: Two times a day (BID) | ORAL | 0 refills | Status: AC
Start: 2022-01-01 — End: 2022-01-06
  Filled 2022-01-01: qty 10, 5d supply, fill #0

## 2022-01-01 NOTE — Discharge Instructions (Signed)
Follow with Primary MD [name], [name], MD in 10 days  ? ?Get CBC, CMP, 2 view Chest X ray checked  by Primary MD next visit.  ? ? ?Activity: As tolerated with Full fall precautions use walker/cane & assistance as needed ? ? ?Disposition Home  ? ? ?Diet: Heart Healthy  , with feeding assistance and aspiration precautions. ? ? ? ?On your next visit with your primary care physician please Get Medicines reviewed and adjusted. ? ? ?Please request your Prim.MD to go over all Hospital Tests and Procedure/Radiological results at the follow up, please get all Hospital records sent to your Prim MD by signing hospital release before you go home. ? ? ?If you experience worsening of your admission symptoms, develop shortness of breath, life threatening emergency, suicidal or homicidal thoughts you must seek medical attention immediately by calling 911 or calling your MD immediately  if symptoms less severe. ? ?You Must read complete instructions/literature along with all the possible adverse reactions/side effects for all the Medicines you take and that have been prescribed to you. Take any new Medicines after you have completely understood and accpet all the possible adverse reactions/side effects.  ? ? ?Do not drive when taking Pain medications.  ? ? ?Do not take more than prescribed Pain, Sleep and Anxiety Medications ? ?Special Instructions: If you have smoked or chewed Tobacco  in the last 2 yrs please stop smoking, stop any regular Alcohol  and or any Recreational drug use. ? ?Wear Seat belts while driving. ? ? ?Please note ? ?You were cared for by a hospitalist during your hospital stay. If you have any questions about your discharge medications or the care you received while you were in the hospital after you are discharged, you can call the unit and asked to speak with the hospitalist on call if the hospitalist that took care of you is not available. Once you are discharged, your primary care physician will handle any  further medical issues. Please note that NO REFILLS for any discharge medications will be authorized once you are discharged, as it is imperative that you return to your primary care physician (or establish a relationship with a primary care physician if you do not have one) for your aftercare needs so that they can reassess your need for medications and monitor your lab values.  ?

## 2022-01-01 NOTE — Telephone Encounter (Signed)
Patient would like to cancel 04/21 appointment due to not feeling well, 04/21 appointments are cancelled. ?

## 2022-01-01 NOTE — Progress Notes (Signed)
Physical Therapy Treatment ?Patient Details ?Name: Tony [name] ?MRN: [id number] ?DOB: 1941/09/13 ?Today's Date: 01/01/2022 ? ? ?History of Present Illness 81 yo male presenting to ED with with SOB and hypotension. Tested COVID +. 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 ? ?  ?PT Comments  ? ? Patient eager to go home today (per wife MD is planning to discharge). Patient progressing with his mobility in that he needed no physical assistance today. [place] to supervision provided for safety and pt with no loss of balance. Good use of RW. Agree OK to discharge home from a PT perspective.  ?   ?Recommendations for follow up therapy are one component of a multi-disciplinary discharge planning process, led by the attending physician.  Recommendations may be updated based on patient status, additional functional criteria and insurance authorization. ? ?Follow Up Recommendations ? Home health PT ?  ?  ?Assistance Recommended at Discharge Intermittent Supervision/Assistance  ?Patient can return home with the following A little help with walking and/or transfers;A little help with bathing/dressing/bathroom;Assistance with cooking/housework;Help with stairs or ramp for entrance ?  ?Equipment Recommendations ? None recommended by PT  ?  ?Recommendations for Other Services   ? ? ?  ?Precautions / Restrictions Precautions ?Precautions: Fall;Other (comment) ?Precaution Comments: COVID ?Restrictions ?Weight Bearing Restrictions: No  ?  ? ?Mobility ? Bed Mobility ?Overal bed mobility: Needs Assistance ?Bed Mobility: Supine to Sit, Sit to Supine ?  ?  ?Supine to sit: Supervision, HOB elevated ?Sit to supine: Supervision ?  ?General bed mobility comments: No assist needed. ?  ? ?Transfers ?Overall transfer level: Needs assistance ?Equipment used: Rolling walker (2 wheels) ?Transfers: Sit to/from Stand ?Sit to  Stand: Supervision ?  ?  ?  ?  ?  ?General transfer comment: no assist from EOB ?  ? ?Ambulation/Gait ?Ambulation/Gait assistance: Min guard ?Gait Distance (Feet): 90 Feet ?Assistive device: Rolling walker (2 wheels) ?Gait Pattern/deviations: Step-through pattern, Decreased stride length ?Gait velocity: decreased ?  ?  ?General Gait Details: Slow, steady gait with 2/4 DOE.  Sp02 >93% on RA. ? ? ?Stairs ?  ?  ?  ?  ?  ? ? ?Wheelchair Mobility ?  ? ?Modified Rankin (Stroke Patients Only) ?  ? ? ?  ?Balance Overall balance assessment: Needs assistance ?Sitting-balance support: Feet supported, No upper extremity supported ?Sitting balance-Leahy Scale: Good ?  ?  ?Standing balance support: During functional activity ?Standing balance-Leahy Scale: Fair ?Standing balance comment: Needs UE support for walking ?  ?  ?  ?  ?  ?  ?  ?  ?  ?  ?  ?  ? ?  ?Cognition Arousal/Alertness: Awake/alert ?Behavior During Therapy: [facility] for tasks assessed/performed ?Overall Cognitive Status: Within Functional Limits for tasks assessed ?  ?  ?  ?  ?  ?  ?  ?  ?  ?  ?  ?  ?  ?  ?  ?  ?  ?  ?  ? ?  ?Exercises   ? ?  ?General Comments General comments (skin integrity, edema, etc.): Wife present and reports pt to discharge home today ?  ?  ? ?Pertinent Vitals/Pain Pain Assessment ?Faces Pain Scale: No hurt  ? ? ?Home Living   ?  ?  ?  ?  ?  ?  ?  ?  ?  ?   ?  ?Prior Function    ?  ?  ?   ? ?  PT Goals (current goals can now be found in the care plan section) Acute Rehab PT Goals ?Patient Stated Goal: return home ?Time For Goal Achievement: 01/12/22 ?Potential to Achieve Goals: Good ?Progress towards PT goals: Progressing toward goals ? ?  ?Frequency ? ? ? Min 3X/week ? ? ? ?  ?PT Plan Current plan remains appropriate  ? ? ?Co-evaluation   ?  ?  ?  ?  ? ?  ?AM-PAC PT "6 Clicks" Mobility   ?Outcome Measure ? Help needed turning from your back to your side while in a flat bed without using bedrails?: None ?Help needed moving from lying on your back  to sitting on the side of a flat bed without using bedrails?: A Little ?Help needed moving to and from a bed to a chair (including a wheelchair)?: A Little ?Help needed standing up from a chair using your arms (e.g., wheelchair or bedside chair)?: A Little ?Help needed to walk in hospital room?: A Little ?Help needed climbing 3-5 steps with a railing? : A Little ?6 Click Score: 19 ? ?  ?End of Session Equipment Utilized During Treatment: Gait belt ?Activity Tolerance: Patient tolerated treatment well ?Patient left: in bed;with call bell/phone within reach;with bed alarm set;with family/visitor present (sitting EOB) ?Nurse Communication: Mobility status ?PT Visit Diagnosis: Unsteadiness on feet (R26.81);Muscle weakness (generalized) (M62.81);Difficulty in walking, not elsewhere classified (R26.2) ?  ? ? ?Time: 3151-7616 ?PT Time Calculation (min) (ACUTE ONLY): 15 min ? ?Charges:  $Gait Training: 8-22 mins          ?          ? ? ?Arby Barrette, PT ?Acute Rehabilitation Services  ?Pager [phone] ?Office [phone] ? ? ? ?Jeanie Cooks Danya Spearman ?01/01/2022, 12:07 PM ? ?

## 2022-01-01 NOTE — Progress Notes (Signed)
Discharge instructions discussed and reviewed with the patient and his spouse, understanding voiced. Exited unit via wheelchair in no distress with all belongings accompanied by spouse and 2 Nurse Techs. ?

## 2022-01-01 NOTE — Discharge Summary (Signed)
Physician Discharge Summary  ?Tony [name] TXM:[id number] DOB: [birth year] DOA: 12/24/2021 ? ?PCP: [name], [name], MD ? ?Admit date: 12/24/2021 ?Discharge date: 01/01/2022 ? ?Admitted From:Home ?Disposition:  Home ? ?Recommendations for Outpatient Follow-up:  ?Follow up with PCP in 1-2 weeks ?Patient to follow with GI as an outpatient regarding cholelithiasis ?Continue surveillance for AAA as outpatient  ? ?Home Health:YES ? ?Discharge Condition:Stable ?CODE STATUS:FULL ?Diet recommendation: Heart Healthy  ? ? ?Brief/Interim Summary: ? ?81 y.o. WM PMHx anxiety DO, thoracic aortic aneurysm, prostate cancer, tobacco abuse, essential HTN, trifascicular block, CAD, sick sinus syndrome, s/p pacemaker, chronic respiratory failure with hypoxia, COPD mixed type, OSA ? ?Presented to [facility] with a chief complaint of shortness of breath and generalized weakness ?  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  ?He developed shortness of breath and generalized weakness overnight on 4/11 - 4/12.  Denies nausea, vomiting, diarrhea ?  ?ED course was notable for BP 84/58, initial oxygen saturation of 79%, and HR 135.  A code sepsis was initiated.  2 L of IV fluid and blood cultures were ordered, broad-spectrum antibiotics started, patient placed on BiPAP.  The patient was found to be COVID-positive.   ?  ?  ?Acute respiratory failure due to COVID 19 viral pneumonia ?-No evidence of PE. ?-Elevated LFTs, so did not receive remdesivir ?-Baricitinib has been discontinued due to bacteremia ?-He is on room air today, encouraged to keep using incentive spirometer and flutter valve on discharge. ?-Continue with Decadron.  Finish total of 10 days. ? ?Sepsis positive E. coli bacteremia; concerned for biliary source of infection ?-ID input greatly appreciated, given ultrasound showing stones versus  sludge in gallbladder, MRCP showing cholelithiasis without choledocholithiasis or cholecystitis. ?-Patient to follow with his primary GI Dr  Watt Climes regarding this, appointment has been scheduled for 01/14/2022 ?-Antibiotics management per ID, treated with Rocephin and metronidazole during hospital stay, transitioned to Bactrim DS and metronidazole on discharge to complete total of 2 weeks until 01/06/2022 ?  ?COPD, not acutely exacerbated ?-See acute respiratory failure ?  ?OSA ? ?  ?Abdominal aortic aneurysm ?- 4/14 CT abdomen:4.2 cm infrarenal abdominal aortic aneurysm ?-4/15 patient states he has a CT surgeon who follows his AAA. ?  ?Lactic acidosis ?-resolved ?  ?Essential HTN,  ?-Continue with home medications ?  ?Sick sinus syndrome and trifascicular block s/p pacemaker placement ?  ?Infrarenal AAA ?Last ECHO 10/22 EF 60-65% ?- continue surveillance for AAA as outpatient  ? ?HLD ?-4/17 lipid panel pending ?  ?Hyperbilirubinemia disproportionate to elevated transaminase levels ?-RUQ [place] to eval GB ?-liver US with doppler to r/o hepatic vein thrombus due to hypercoagulability with covid ?-Liver enzymes improving ?  ?Cholelithiasis ?- 4/15 negative obstruction see CT findings below ?-4/18 per ID and GI follow-up with Dr. Watt Climes GI 2 weeks following discharge from hospital, for discussion of cholecystectomy ?  ?Transaminitis  ?AST 1288, ALT 643, bilirubin 2.8, being seen outpatient for issues with jaundice. ?-Hepatitis panel negative ?-Has significantly improved, AST normalized, ALT down to 70 ?  ?Anxiety ?-Resume home medications on discharge ?  ?Altered mental status>> hospital delirium ?-This has resolved. ?  ?  ?GERD ?-con't PPI, which is a home med ?  ?  ?Hyperglycemia ?-4/14 hemoglobin A1c= 5.9 ?Sensitive SSI during hospital stay, ?  ?History of prostate cancer; s{ radiation, seed implant brachytherapy, androgen deprivation therapy ?-Zytiga 250 mg  daily ?- 4/18 restart Prednisone 40 mg daily; at day 10,  return to  Prednisone 5 mg daily which is his home dose ?  ?Obese (BMI 35.63 kg/m?.) ? ?  ? ?Discharge Diagnoses:  ?Principal Problem: ?  Respiratory failure with hypoxia ([place]) ?Active Problems: ?  Aneurysm of thoracic aorta (HCC) ?  COPD mixed type ([place]) ?  Malignant neoplasm of prostate ([place]) ?  Anxiety disorder ?  Tobacco use disorder ?  Hypercholesterolemia ?  Essential hypertension, benign ?  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[place]) ?  Chronic fatigue ?  Trifascicular block ?  Coronary artery disease involving native coronary artery of native heart without angina pectoris ?  Sick sinus syndrome ([place]) ?  Pacemaker ?  Obesity with body mass index (BMI) of 30.0 to 39.9 ?  Prediabetes ?  OSA (obstructive sleep apnea) ?  Chronic respiratory failure with hypoxia (HCC) ?  COVID ?  Severe sepsis ([place]) ?  Transaminitis ?  Abdominal aortic aneurysm (AAA) 3.0 cm to 5.5 cm in diameter in male [facility]) ? ? ? ?Discharge Instructions ? ?Discharge Instructions   ? ? Diet - low sodium heart healthy   Complete by: As directed ?  ? Discharge instructions   Complete by: As directed ?  ? Follow with Primary MD [name], [name], MD in 7 days  ? ?Get CBC, CMP, 2 view Chest X ray checked  by Primary MD next visit.  ? ? ?Activity: As tolerated with Full fall precautions use walker/cane & assistance as needed ? ? ?Disposition Home ** ? ? ?Diet: Heart Healthy  , with feeding assistance and aspiration precautions. ? ?For Heart failure patients - Check your Weight same time everyday, if you gain over 2 pounds, or you develop in leg swelling, experience more shortness of breath or chest pain, call your Primary MD immediately. Follow Cardiac Low Salt Diet and 1.5 lit/day fluid restriction. ? ? ?On your next visit with your primary care physician please Get Medicines reviewed and adjusted. ? ? ?Please request your Prim.MD to go over all Hospital Tests and Procedure/Radiological results at the follow up, please get all Hospital records sent to  your Prim MD by signing hospital release before you go home. ? ? ?If you experience worsening of your admission symptoms, develop shortness of breath, life threatening emergency, suicidal or homicidal thoughts you must seek medical attention immediately by calling 911 or calling your MD immediately  if symptoms less severe. ? ?You Must read complete instructions/literature along with all the possible adverse reactions/side effects for all the Medicines you take and that have been prescribed to you. Take any new Medicines after you have completely understood and accpet all the possible adverse reactions/side effects.  ? ?Do not drive, operating heavy machinery, perform activities at heights, swimming or participation in water activities or provide baby sitting services if your were admitted for syncope or siezures until you have seen by Primary MD or a Neurologist and advised to do so again. ? ?Do not drive when taking Pain medications.  ? ? ?Do not take more than prescribed Pain, Sleep and Anxiety Medications ? ?Special Instructions: If you have smoked or chewed Tobacco  in the last 2 yrs please stop smoking, stop any regular Alcohol  and or any Recreational drug use. ? ?Wear Seat belts while driving. ? ? ?Please note ? ?You were cared for by a hospitalist during your hospital stay. If you have any questions about your discharge medications or the  care you received while you were in the hospital after you are discharged, you can call the unit and asked to speak with the hospitalist on call if the hospitalist that took care of you is not available. Once you are discharged, your primary care physician will handle any further medical issues. Please note that NO REFILLS for any discharge medications will be authorized once you are discharged, as it is imperative that you return to your primary care physician (or establish a relationship with a primary care physician if you do not have one) for your aftercare needs so  that they can reassess your need for medications and monitor your lab values.  ? Increase activity slowly   Complete by: As directed ?  ? ?  ? ?Allergies as of 01/01/2022   ? ?   Reactions  ? Penicillins

## 2022-01-01 NOTE — TOC Transition Note (Signed)
Transition of Care (TOC) - CM/SW Discharge Note ? ? ?Patient Details  ?Name: Tony [name] Nephew ?MRN: [id number] ?Date of Birth: October 05, 1940 ? ?Transition of Care (TOC) CM/SW Contact:  ?Cyndi Bender, RN ?Phone Number: ?01/01/2022, 11:13 AM ? ? ?Clinical Narrative:    ?Patient stable for discharge. Home health arranged. No DME needs. Wife can transport home. ? ? ?Final next level of care: [place] ?Barriers to Discharge: Barriers Resolved ? ? ?Patient Goals and CMS Choice ?Patient states their goals for this hospitalization and ongoing recovery are:: return home ?CMS Medicare.gov Compare Post Acute Care list provided to:: Patient Represenative (must comment) ?Choice offered to / list presented to : Spouse ? ?Discharge Placement ?  ?          home ?  ?  ?  ?  ? ?Discharge Plan and Services ?  ?Discharge Planning Services: CM Consult ?Post Acute Care Choice: Home Health          ?  ?  ?  ?  ?  ?HH Arranged: PT, OT ?[place] Agency: [place] ?Date HH Agency Contacted: 12/31/21 ?Time [place]: 7062 ?Representative spoke with at [place]: Malachy Mood ? ?Social Determinants of Health (SDOH) Interventions ?  ? ? ?Readmission Risk Interventions ? ?  01/01/2022  ? 11:11 AM  ?Readmission Risk Prevention Plan  ?Transportation Screening Complete  ?Medication Review Press photographer) Complete  ?PCP or Specialist appointment within 3-5 days of discharge Complete  ?[place] or Home Care Consult Complete  ?SW Recovery Care/Counseling Consult Complete  ?Palliative Care Screening Not Applicable  ?[place] Not Applicable  ? ? ? ? ? ?

## 2022-01-02 ENCOUNTER — Inpatient Hospital Stay: Payer: Medicare Other

## 2022-01-02 ENCOUNTER — Telehealth: Payer: Self-pay

## 2022-01-02 ENCOUNTER — Inpatient Hospital Stay: Payer: Medicare Other | Admitting: Oncology

## 2022-01-02 NOTE — Telephone Encounter (Signed)
Per spouse ? ?Transition Care Management Follow-up Telephone Call ?Date of discharge and from where: 01/01/2022 Tony [name] ?How have you been since you were released from the hospital? Doing better ?Any questions or concerns? No ? ?Items Reviewed: ?Did the pt receive and understand the discharge instructions provided? Yes  ?Medications obtained and verified? Yes  ?Other? No  ?Any new allergies since your discharge? No  ?Dietary orders reviewed? Yes ?Do you have support at home? Yes  ? ?Home Care and Equipment/Supplies: ?Were home health services ordered? yes ?If so, what is the name of the agency?  [facility] ?Has the agency set up a time to come to the patient's home? no ?Were any new equipment or medical supplies ordered?  No ?What is the name of the medical supply agency? N/a ?Were you able to get the supplies/equipment? no ?Do you have any questions related to the use of the equipment or supplies? No ? ?Functional Questionnaire: (I = Independent and D = Dependent) ?ADLs: i ? ?Bathing/Dressing- i ? ?Meal Prep- d ? ?Eating- i ? ?Maintaining continence- i ? ?Transferring/Ambulation- I, uses walker ? ?Managing Meds- d ? ?Follow up appointments reviewed: ? ?PCP Hospital f/u appt confirmed? Yes  Scheduled to see Dr. [name]  on 01/14/2022 @ 2:30. ?[facility] f[facility]u [facility]t confirmed? Yes  Scheduled to see Dr. Manya Silvas on 01/14/2022 @ 4:30. ?Are transportation arrangements needed? No  ?If their condition worsens, is the pt aware to call PCP or go to the Emergency Dept.? Yes ?Was the patient provided with contact information for the PCP's office or ED? Yes ?Was to pt encouraged to call back with questions or concerns? Yes ? ?

## 2022-01-05 ENCOUNTER — Telehealth: Payer: Self-pay | Admitting: Family Medicine

## 2022-01-05 ENCOUNTER — Other Ambulatory Visit: Payer: Self-pay | Admitting: Family Medicine

## 2022-01-05 DIAGNOSIS — R600 Localized edema: Secondary | ICD-10-CM

## 2022-01-05 NOTE — Telephone Encounter (Signed)
[name] (Pt Wife) called stating that pt was released from the hospital with Rx Metronidazole 500 MG and she believes it is keeping him up at night and was wondering if it causes insomnia. Since it is keeping him up she wanted to know what he can take in the meantime. She would like a call back. [name] [phone] ? ?Please advise.  ?

## 2022-01-06 NOTE — Telephone Encounter (Signed)
Insomnia has been reported with this medication but it is not a common side effects. Because he is being treated for infection, it is important to complete treatment. ?OTC Melatonin 15 mg 2 hours before bedtime with empty stomach may help. ?Thanks, ?BJ ? ?

## 2022-01-06 NOTE — Telephone Encounter (Signed)
I left a message for Tony [name] to return my call. ?

## 2022-01-07 NOTE — Telephone Encounter (Signed)
I spoke with patient's wife. Patient completed the Metronidazole yesterday, so they will try the melatonin. He has a f/u visit next week. ?

## 2022-01-12 DIAGNOSIS — R269 Unspecified abnormalities of gait and mobility: Secondary | ICD-10-CM | POA: Diagnosis not present

## 2022-01-12 DIAGNOSIS — G4733 Obstructive sleep apnea (adult) (pediatric): Secondary | ICD-10-CM | POA: Diagnosis not present

## 2022-01-13 NOTE — Progress Notes (Signed)
? ?HPI: ?[name] is a 81 y.o. male with hx of anxiety, thoracic aortic aneurysm, prostate cancer, tobacco abuse, essential HTN, trifascicular block, CAD, sick sinus syndrome, s/p pacemaker, chronic respiratory failure with hypoxia, COPD mixed type, OSA  who is here today to follow on recent hospitalization. ?Hospitalized from 12/24/2021 to 01/01/2022. ?[place] call on 01/02/22. ? ?He presented to the ED on 12/23/2021 complaining of shortness of breath and generalized weakness. ?BP was 84/58, O2 sats 79% RA, and HR 135. ?Broad-spectrum antibiotic dose started and he was placed on BiPAP. ?COVID-19 was positive. ?COVID-19 pneumonia:Treatment with Baricitinib was d/c because presence of bacteremia. ?10 days of Decadron completed. ? ?Sepsis: E. Coli bacteremia:  ?ID consultation during hospitalization. ?He was treated with Rocephin and metronidazole during hospitalization, changed to Bactrim DS and metronidazole at the time of discharge to complete 2 weeks of treatment (01/06/22). ?Lactic acidosis resolved. ? ?Lab Results  ?Component Value Date  ? WBC 8.9 01/01/2022  ? HGB 12.6 (L) 01/01/2022  ? HCT 38.5 (L) 01/01/2022  ? MCV 94.6 01/01/2022  ? PLT 245 01/01/2022  ? ?HTN on Metoprolol Tartrate 25 mg bid. ?LE edema worse since hospital discharge. ?He is on Furosemide 20 mg daily. ?Started wearing compression stockings today. ?Negative for pain or erythema. ?Negative for orthopnea and PND. ?Lab Results  ?Component Value Date  ? CREATININE 0.77 01/01/2022  ? BUN 13 01/01/2022  ? NA 137 01/01/2022  ? K 4.1 01/01/2022  ? CL 109 01/01/2022  ? CO2 20 (L) 01/01/2022  ? ?Upon initial evaluation AST 1288, ALT 643, bilirubin 2.8. ?Has had abnormal LFT's and jaundice intermittently for a few months. ? ?Negative for abdominal pain, nausea, vomiting, changes in bowel habits, blood in stool or melena. ? ?Cholelithiasis , pending surgical evaluation for cholecystectomy. ?Following with surgeon today. ? ?Lab Results  ?Component  Value Date  ? ALT 70 (H) 01/01/2022  ? AST 30 01/01/2022  ? ALKPHOS 70 01/01/2022  ? BILITOT 0.7 01/01/2022  ? ?[place] services have not been arranged. ?He has a cane to help with transfer, which is his baseline. ?Decreased endurance. ?+ Fatigue. ?Appetite is good. ? ?Advance prostate cancer and lymphadenopathy, s/p radiation therapy completed in 05/2014, seed implant brachytherapy 06/2014.Currently on Eligard q 3 months, Zytiga 1000 mg and Prednisone 5 mg daily since 2017. ? ?Cough, non productive. Has had problem intermittent for months. ?Benzonatate helps, he is requesting a refill. ?+ Dysphonia for the past 2 days, no stridor or wheezing. ?COPD on Albuterol inh 2 puff qid prn and Symbicort 160-4.5 mcg bid. ?Follows with pulmonologist. ?Nocturnal O2 supplementation with CPAP. ? ?Tongue stinging and burning sensation, honey helps.He has had problem since hospital discharge, no oral lesions. Wife wonders about benadryl can be used. ? ?Review of Systems  ?Constitutional:  Positive for activity change and fatigue. Negative for appetite change and fever.  ?HENT:  Negative for mouth sores, nosebleeds and sore throat.   ?Eyes:  Negative for redness and visual disturbance.  ?Respiratory:  Negative for shortness of breath and wheezing.   ?Cardiovascular:  Negative for chest pain and palpitations.  ?Genitourinary:  Negative for decreased urine volume, dysuria and hematuria.  ?Musculoskeletal:  Positive for gait problem. Negative for myalgias.  ?Skin:  Negative for rash.  ?Neurological:  Negative for syncope, weakness and headaches.  ?Psychiatric/Behavioral:  Positive for sleep disturbance. Negative for confusion and hallucinations.   ?Rest see pertinent positives and negatives per HPI. ? ?Current Outpatient Medications on File Prior to Visit  ?Medication  Sig Dispense Refill  ? abiraterone acetate (ZYTIGA) 250 MG tablet TAKE 4 TABLETS (1,000 MG TOTAL) BY MOUTH DAILY. TAKE ON AN EMPTY STOMACH 1 HOUR BEFORE OR 2 HOURS AFTER A MEAL  (Patient taking differently: Take 250 mg by mouth daily.) 120 tablet 2  ? acetaminophen (TYLENOL) 500 MG tablet Take 1,000 mg by mouth every 6 (six) hours as needed for headache or moderate pain (pain).    ? albuterol (VENTOLIN HFA) 108 (90 Base) MCG/ACT inhaler TAKE 2 PUFFS BY MOUTH EVERY 6 HOURS AS NEEDED FOR WHEEZE OR SHORTNESS OF BREATH (Patient taking differently: Inhale 2 puffs into the lungs every 6 (six) hours as needed for wheezing or shortness of breath.) 18 each 12  ? Ascorbic Acid (VITAMIN C PO) Take 1 tablet by mouth daily.    ? aspirin EC 81 MG tablet Take 81 mg by mouth daily.    ? atorvastatin (LIPITOR) 20 MG tablet TAKE 1 TABLET BY MOUTH EVERY DAY 90 tablet 0  ? budesonide-formoterol (SYMBICORT) 160-4.5 MCG/ACT inhaler Inhale 2 puffs then rinse mouth, twice daily (Patient taking differently: Inhale 2 puffs into the lungs 2 (two) times daily. Inhale 2 puffs then rinse mouth, twice daily) 1 each 12  ? citalopram (CELEXA) 10 MG tablet TAKE 1 TABLET BY MOUTH EVERY DAY (Patient taking differently: Take 10 mg by mouth daily.) 90 tablet 3  ? diclofenac Sodium (VOLTAREN) 1 % GEL AAPLY 4 GRAMS TOPICALLY 4 TIMES DAILY 400 g 2  ? docusate sodium (COLACE) 100 MG capsule Take 100 mg by mouth daily with supper.    ? fluticasone (FLONASE) 50 MCG/ACT nasal spray PLACE 1 SPRAY INTO BOTH NOSTRILS DAILY AS NEEDED FOR ALLERGIES OR RHINITIS (AT BEDTIME). (Patient taking differently: Place 1 spray into both nostrils at bedtime as needed for allergies or rhinitis.) 48 mL 1  ? furosemide (LASIX) 20 MG tablet TAKE 1 TABLET BY MOUITH DAILY FOR 5 DAYS , THEN DAILY AS NEEDED FOR EDEMA 90 tablet 1  ? LORazepam (ATIVAN) 1 MG tablet Take 0.5-1 tablets (0.5-1 mg total) by mouth 2 (two) times daily as needed for anxiety. 60 tablet 3  ? metoprolol tartrate (LOPRESSOR) 25 MG tablet TAKE 1 TABLET BY MOUTH TWICE A DAY (Patient taking differently: Take 25 mg by mouth 2 (two) times daily.) 180 tablet 2  ? Multiple Vitamin (MULTIVITAMIN  WITH MINERALS) TABS tablet Take 0.5 tablets by mouth daily.    ? omeprazole (PRILOSEC) 20 MG capsule TAKE 1 CAPSULE (20 MG TOTAL) BY MOUTH DAILY BEFORE BREAKFAST. (Patient taking differently: Take 20 mg by mouth daily.) 90 capsule 3  ? predniSONE (DELTASONE) 5 MG tablet Take 1 tablet (5 mg total) by mouth daily with breakfast. 90 tablet 3  ? Tetrahydrozoline HCl (VISINE OP) Place 1 drop into both eyes daily as needed (red eyes).    ? ?No current facility-administered medications on file prior to visit.  ? ?Past Medical History:  ?Diagnosis Date  ? COPD (chronic obstructive pulmonary disease) ([place])   ? Gastric outlet obstruction 02/03/2018  ? [name] 02/03/2018  ? GERD (gastroesophageal reflux disease)   ? History of hiatal hernia   ? small/notes 02/03/2018  ? Hypertension   ? no meds  ? Presence of permanent cardiac pacemaker   ? Prostate cancer ([place]) 02/08/2014  ? Gleason 4+5=9, PSA 15.65  ? Radiation   ? Sinus problem   ? SSS (sick sinus syndrome) ([place])   ? ?Allergies  ?Allergen Reactions  ? Penicillins Rash  ?  Has  patient had a PCN reaction causing immediate rash, facial/tongue/throat swelling, SOB or lightheadedness with hypotension: Yes ?Has patient had a PCN reaction causing severe rash involving mucus membranes or skin necrosis: No ?Has patient had a PCN reaction that required hospitalization: No ?Has patient had a PCN reaction occurring within the last 10 years: No ?If all of the above answers are "NO", then may proceed with Cephalosporin use.  ? ? ?Social History  ? ?Socioeconomic History  ? Marital status: Married  ?  Spouse name: Not on file  ? Number of children: 3  ? Years of education: Not on file  ? Highest education level: Not on file  ?Occupational History  ? Occupation: Freight forwarder  ?  Employer: [employer]  ?  Comment: retired  ? Occupation: Freight forwarder  ?  Comment: [employer]-[place]  ?Tobacco Use  ? Smoking status: Former  ?  Types: Cigars  ?  Quit date: 09/14/2012  ?  Years since quitting: 9.3  ?  Smokeless tobacco: Never  ? Tobacco comments:  ?  little cigars; the small ones"  ?Vaping Use  ? Vaping Use: Never used  ?Substance and Sexual Activity  ? Alcohol use: Not Currently  ?  Alcohol/week: 14.0 sta

## 2022-01-14 ENCOUNTER — Encounter: Payer: Self-pay | Admitting: Family Medicine

## 2022-01-14 ENCOUNTER — Ambulatory Visit (INDEPENDENT_AMBULATORY_CARE_PROVIDER_SITE_OTHER): Payer: Medicare Other | Admitting: Family Medicine

## 2022-01-14 VITALS — BP 130/80 | HR 88 | Resp 16 | Ht 68.0 in | Wt 232.1 lb

## 2022-01-14 DIAGNOSIS — K8020 Calculus of gallbladder without cholecystitis without obstruction: Secondary | ICD-10-CM | POA: Diagnosis not present

## 2022-01-14 DIAGNOSIS — R7989 Other specified abnormal findings of blood chemistry: Secondary | ICD-10-CM | POA: Diagnosis not present

## 2022-01-14 DIAGNOSIS — R053 Chronic cough: Secondary | ICD-10-CM

## 2022-01-14 DIAGNOSIS — I10 Essential (primary) hypertension: Secondary | ICD-10-CM | POA: Diagnosis not present

## 2022-01-14 DIAGNOSIS — J9611 Chronic respiratory failure with hypoxia: Secondary | ICD-10-CM | POA: Diagnosis not present

## 2022-01-14 DIAGNOSIS — C61 Malignant neoplasm of prostate: Secondary | ICD-10-CM

## 2022-01-14 DIAGNOSIS — R600 Localized edema: Secondary | ICD-10-CM | POA: Diagnosis not present

## 2022-01-14 MED ORDER — BENZONATATE 100 MG PO CAPS
200.0000 mg | ORAL_CAPSULE | Freq: Every day | ORAL | 1 refills | Status: DC | PRN
Start: 2022-01-14 — End: 2022-03-11

## 2022-01-14 NOTE — Patient Instructions (Addendum)
A few things to remember from today's visit: ? ?Essential hypertension, benign ? ?Chronic respiratory failure with hypoxia ([place]) - Plan: Ambulatory referral to Home Health ? ?Abnormal liver function test ? ?Chronic cough - Plan: benzonatate (TESSALON) 100 MG capsule ? ?If you need refills please call your pharmacy. ?Do not use My Chart to request refills or for acute issues that need immediate attention. ?  ?Increase furosemide to 2 times daily , morning and noon for a week then back to once daily. Leg elevation above heart level a few times during the day. Continue compression stockings. ? ?No changes in rest  today. ?Home health will be arranged. ? ?Please be sure medication list is accurate. ?If a new problem present, please set up appointment sooner than planned today. ? ? ? ? ? ? ? ?

## 2022-01-16 ENCOUNTER — Other Ambulatory Visit: Payer: Self-pay

## 2022-01-16 ENCOUNTER — Other Ambulatory Visit (HOSPITAL_COMMUNITY): Payer: Self-pay

## 2022-01-16 ENCOUNTER — Inpatient Hospital Stay: Payer: Medicare Other | Attending: Oncology

## 2022-01-16 ENCOUNTER — Inpatient Hospital Stay: Payer: Medicare Other | Admitting: Oncology

## 2022-01-16 ENCOUNTER — Inpatient Hospital Stay: Payer: Medicare Other

## 2022-01-16 DIAGNOSIS — C61 Malignant neoplasm of prostate: Secondary | ICD-10-CM | POA: Diagnosis present

## 2022-01-16 DIAGNOSIS — E876 Hypokalemia: Secondary | ICD-10-CM | POA: Diagnosis not present

## 2022-01-16 DIAGNOSIS — Z5111 Encounter for antineoplastic chemotherapy: Secondary | ICD-10-CM | POA: Diagnosis not present

## 2022-01-16 DIAGNOSIS — I10 Essential (primary) hypertension: Secondary | ICD-10-CM | POA: Diagnosis not present

## 2022-01-16 DIAGNOSIS — E669 Obesity, unspecified: Secondary | ICD-10-CM

## 2022-01-16 LAB — CMP (CANCER CENTER ONLY)
ALT: 17 U/L (ref 0–44)
AST: 16 U/L (ref 15–41)
Albumin: 3.7 g/dL (ref 3.5–5.0)
Alkaline Phosphatase: 80 U/L (ref 38–126)
Anion gap: 8 (ref 5–15)
BUN: 15 mg/dL (ref 8–23)
CO2: 29 mmol/L (ref 22–32)
Calcium: 9.1 mg/dL (ref 8.9–10.3)
Chloride: 99 mmol/L (ref 98–111)
Creatinine: 0.9 mg/dL (ref 0.61–1.24)
GFR, Estimated: >60 mL/min (ref >60)
Glucose, Bld: 149 mg/dL — ABNORMAL HIGH (ref 70–99)
Potassium: 4.4 mmol/L (ref 3.5–5.1)
Sodium: 136 mmol/L (ref 135–145)
Total Bilirubin: 0.8 mg/dL (ref 0.3–1.2)
Total Protein: 6.7 g/dL (ref 6.5–8.1)

## 2022-01-16 LAB — CBC WITH DIFFERENTIAL (CANCER CENTER ONLY)
Abs Immature Granulocytes: 0.05 10*3/uL (ref 0.00–0.07)
Basophils Absolute: 0 10*3/uL (ref 0.0–0.1)
Basophils Relative: 1 %
Eosinophils Absolute: 0.1 10*3/uL (ref 0.0–0.5)
Eosinophils Relative: 1 %
HCT: 39.6 % (ref 39.0–52.0)
Hemoglobin: 13.1 g/dL (ref 13.0–17.0)
Immature Granulocytes: 1 %
Lymphocytes Relative: 12 %
Lymphs Abs: 0.9 10*3/uL (ref 0.7–4.0)
MCH: 30.8 pg (ref 26.0–34.0)
MCHC: 33.1 g/dL (ref 30.0–36.0)
MCV: 93 fL (ref 80.0–100.0)
Monocytes Absolute: 0.6 10*3/uL (ref 0.1–1.0)
Monocytes Relative: 7 %
Neutro Abs: 5.9 10*3/uL (ref 1.7–7.7)
Neutrophils Relative %: 78 %
Platelet Count: 170 10*3/uL (ref 150–400)
RBC: 4.26 MIL/uL (ref 4.22–5.81)
RDW: 14.2 % (ref 11.5–15.5)
WBC Count: 7.5 10*3/uL (ref 4.0–10.5)
nRBC: 0 % (ref 0.0–0.2)

## 2022-01-16 MED ORDER — LEUPROLIDE ACETATE (3 MONTH) 22.5 MG ~~LOC~~ KIT
22.5000 mg | PACK | Freq: Once | SUBCUTANEOUS | Status: AC
  Administered 2022-01-16: 22.5 mg via SUBCUTANEOUS
  Filled 2022-01-16: qty 22.5

## 2022-01-16 NOTE — Progress Notes (Signed)
Hematology and Oncology Follow Up Visit ? ?Tony [name] ?[id number] ?Apr 27, 1941 81 y.o. ?01/16/2022 11:36 AM ?Tony [name], [name], [name], MD  ? ?Principle Diagnosis: 81 year old man with advanced prostate cancer with lymphadenopathy noted in 2017.  He has castration-sensitive after presenting in 2015 with Gleason score 4+5 = 9 and a PSA 15.7 and localized disease. ? ? ?Prior Therapy:  ?He is status post radiation therapy for external beam radiation completed in September 2015 for a total of 45 gray. He also status post seed implant brachytherapy boost completed in October 2015. ?  ?He received total of 1 year of androgen deprivation 22,015 and 2016. His PSA nadir was to 0.47 in November 2016. His PSA was 4.08 in May 2017. ? ?Current therapy:  ? ?Eligard 22.5 mg every 3 months.  He is due for his next injection will be given today. ? ?Zytiga 1000 mg with prednisone started in August 2017. ?  ?Interim History: Tony [name] is here for a follow-up visit.  Since last visit, he was hospitalized between April 12 and January 09, 2022 for respiratory illness and shortness of breath with presumed COVID-19 viral pneumonia.  He also had E. coli bacteremia.  Since his discharge, he is a recovering slowly at this time without any major complications.  He denies any bone pain or pathological fractures.  His respiratory status continues to improve.  He did have an episode of fall after his discharge but is mobility and strength is improving.  He is ambulating with the help of a cane and appears to be very steady. ? ? ? ? ? ? ? ?Medications: Reviewed without changes. ?Current Outpatient Medications  ?Medication Sig Dispense Refill  ? abiraterone acetate (ZYTIGA) 250 MG tablet TAKE 4 TABLETS (1,000 MG TOTAL) BY MOUTH DAILY. TAKE ON AN EMPTY STOMACH 1 HOUR BEFORE OR 2 HOURS AFTER A MEAL (Patient taking differently: Take 250 mg by mouth daily.) 120 tablet 2  ? acetaminophen (TYLENOL) 500 MG tablet Take 1,000 mg by mouth  every 6 (six) hours as needed for headache or moderate pain (pain).    ? albuterol (VENTOLIN HFA) 108 (90 Base) MCG/ACT inhaler TAKE 2 PUFFS BY MOUTH EVERY 6 HOURS AS NEEDED FOR WHEEZE OR SHORTNESS OF BREATH (Patient taking differently: Inhale 2 puffs into the lungs every 6 (six) hours as needed for wheezing or shortness of breath.) 18 each 12  ? Ascorbic Acid (VITAMIN C PO) Take 1 tablet by mouth daily.    ? aspirin EC 81 MG tablet Take 81 mg by mouth daily.    ? atorvastatin (LIPITOR) 20 MG tablet TAKE 1 TABLET BY MOUTH EVERY DAY 90 tablet 0  ? benzonatate (TESSALON) 100 MG capsule Take 2 capsules (200 mg total) by mouth daily as needed. 30 capsule 1  ? budesonide-formoterol (SYMBICORT) 160-4.5 MCG/ACT inhaler Inhale 2 puffs then rinse mouth, twice daily (Patient taking differently: Inhale 2 puffs into the lungs 2 (two) times daily. Inhale 2 puffs then rinse mouth, twice daily) 1 each 12  ? citalopram (CELEXA) 10 MG tablet TAKE 1 TABLET BY MOUTH EVERY DAY (Patient taking differently: Take 10 mg by mouth daily.) 90 tablet 3  ? diclofenac Sodium (VOLTAREN) 1 % GEL AAPLY 4 GRAMS TOPICALLY 4 TIMES DAILY 400 [name] 2  ? docusate sodium (COLACE) 100 MG capsule Take 100 mg by mouth daily with supper.    ? fluticasone (FLONASE) 50 MCG/ACT nasal spray PLACE 1 SPRAY INTO BOTH NOSTRILS DAILY AS NEEDED FOR ALLERGIES OR RHINITIS (  AT BEDTIME). (Patient taking differently: Place 1 spray into both nostrils at bedtime as needed for allergies or rhinitis.) 48 mL 1  ? furosemide (LASIX) 20 MG tablet TAKE 1 TABLET BY MOUITH DAILY FOR 5 DAYS , THEN DAILY AS NEEDED FOR EDEMA 90 tablet 1  ? LORazepam (ATIVAN) 1 MG tablet Take 0.5-1 tablets (0.5-1 mg total) by mouth 2 (two) times daily as needed for anxiety. 60 tablet 3  ? metoprolol tartrate (LOPRESSOR) 25 MG tablet TAKE 1 TABLET BY MOUTH TWICE A DAY (Patient taking differently: Take 25 mg by mouth 2 (two) times daily.) 180 tablet 2  ? Multiple Vitamin (MULTIVITAMIN WITH MINERALS) TABS  tablet Take 0.5 tablets by mouth daily.    ? omeprazole (PRILOSEC) 20 MG capsule TAKE 1 CAPSULE (20 MG TOTAL) BY MOUTH DAILY BEFORE BREAKFAST. (Patient taking differently: Take 20 mg by mouth daily.) 90 capsule 3  ? predniSONE (DELTASONE) 5 MG tablet Take 1 tablet (5 mg total) by mouth daily with breakfast. 90 tablet 3  ? Tetrahydrozoline HCl (VISINE OP) Place 1 drop into both eyes daily as needed (red eyes).    ? ?No current facility-administered medications for this visit.  ? ? ? ?Allergies:  ?Allergies  ?Allergen Reactions  ? Penicillins Rash  ?  Has patient had a PCN reaction causing immediate rash, facial/tongue/throat swelling, SOB or lightheadedness with hypotension: Yes ?Has patient had a PCN reaction causing severe rash involving mucus membranes or skin necrosis: No ?Has patient had a PCN reaction that required hospitalization: No ?Has patient had a PCN reaction occurring within the last 10 years: No ?If all of the above answers are "NO", then may proceed with Cephalosporin use.  ? ? ? ? ?Physical Exam: ? ? ? ? ? ? ?Blood pressure 108/74, pulse 88, temperature 97.8 ?F (36.6 ?C), temperature source Temporal, resp. rate 17, height '5\' 8"'$  (1.727 m), weight 228 lb 9.6 oz (103.7 kg), SpO2 97 %. ? ? ? ? ? ?ECOG: 1 ? ? ?General appearance: Comfortable appearing without any discomfort ?Head: Normocephalic without any trauma ?Oropharynx: Mucous membranes are moist and pink without any thrush or ulcers. ?Eyes: Pupils are equal and round reactive to light. ?Lymph nodes: No cervical, supraclavicular, inguinal or axillary lymphadenopathy.   ?Heart:regular rate and rhythm.  S1 and S2 without leg edema. ?Lung: Clear without any rhonchi or wheezes.  No dullness to percussion. ?Abdomin: Soft, nontender, nondistended with good bowel sounds.  No hepatosplenomegaly. ?Musculoskeletal: No joint deformity or effusion.  Full range of motion noted. ?Neurological: No deficits noted on motor, sensory and deep tendon reflex  exam. ?Skin: No petechial rash or dryness.  Appeared moist.  ? ? ? ? ? ?  ? ? ? ? ? ? ? ? ? ?Lab Results: ?Lab Results  ?Component Value Date  ? WBC 8.9 01/01/2022  ? HGB 12.6 (L) 01/01/2022  ? HCT 38.5 (L) 01/01/2022  ? MCV 94.6 01/01/2022  ? PLT 245 01/01/2022  ? ?  Chemistry   ?   ?Component Value Date/Time  ? NA 137 01/01/2022 0032  ? NA 139 02/03/2019 0857  ? NA 141 08/27/2017 0915  ? K 4.1 01/01/2022 0032  ? K 4.8 08/27/2017 0915  ? CL 109 01/01/2022 0032  ? CO2 20 (L) 01/01/2022 0032  ? CO2 26 08/27/2017 0915  ? BUN 13 01/01/2022 0032  ? BUN 13 02/03/2019 0857  ? BUN 16.2 08/27/2017 0915  ? CREATININE 0.77 01/01/2022 0032  ? CREATININE 1.12 10/03/2021 0812  ?  CREATININE 1.07 07/31/2020 1325  ? CREATININE 0.9 08/27/2017 0915  ? GLU 105 01/10/2016 0000  ?    ?Component Value Date/Time  ? CALCIUM 8.5 (L) 01/01/2022 0032  ? CALCIUM 9.8 08/27/2017 0915  ? ALKPHOS 70 01/01/2022 0032  ? ALKPHOS 83 08/27/2017 0915  ? AST 30 01/01/2022 0032  ? AST 25 10/03/2021 0812  ? AST 23 08/27/2017 0915  ? ALT 70 (H) 01/01/2022 0032  ? ALT 47 (H) 10/03/2021 2334  ? ALT 17 08/27/2017 0915  ? BILITOT 0.7 01/01/2022 0032  ? BILITOT 1.1 10/03/2021 0812  ? BILITOT 0.95 08/27/2017 0915  ?  ? ? ? ? ? ? ? ? Latest Reference Range & Units 07/04/21 08:24 10/03/21 08:12  ?Prostate Specific Ag, Serum 0.0 - 4.0 ng/mL <0.1 <0.1  ? ?IMPRESSION: ?1. Dependent atelectasis/consolidation in the visualized lung bases ?with trace effusions. ?2. Coronary and Aortic Atherosclerosis (ICD10-170.0). ?3. Cholelithiasis without CT evidence of cholecystitis or biliary ?obstruction. ?4. 4.2 cm infrarenal abdominal aortic aneurysm. Recommend follow-up ?every 12 months and vascular consultation. Reference: J Am Coll ?Radiol 3568;61:[id number]. ? ? ?Impression and Plan: ? ? ?81 year old man with: ? ?1.  Advanced prostate cancer with lymphadenopathy diagnosed in 2017.  He has castration-sensitive disease at this time. ? ?His disease status was updated at this time  and treatment choices were reviewed.  His PSA continues to be undetectable.  CT scan obtained on December 26, 2021 as well as MRI of the abdomen were personally reviewed and showed no evidence of malignancy.  Risks and benef

## 2022-01-17 LAB — PROSTATE-SPECIFIC AG, SERUM (LABCORP): Prostate Specific Ag, Serum: <0.1 ng/mL (ref 0.0–4.0)

## 2022-01-19 ENCOUNTER — Telehealth: Payer: Self-pay | Admitting: *Deleted

## 2022-01-19 ENCOUNTER — Telehealth: Payer: Self-pay | Admitting: Pharmacist

## 2022-01-19 NOTE — Telephone Encounter (Signed)
-----   Message from Wyatt Portela, MD sent at 01/19/2022  9:26 AM EDT ----- ?Please let him know his PSA is still low ?

## 2022-01-19 NOTE — Telephone Encounter (Signed)
Notified of message below

## 2022-01-19 NOTE — Chronic Care Management (AMB) (Signed)
Chronic Care Management Pharmacy Assistant   Name: Tony [name] [facility]  MRN: [id number] DOB: [birth year]  Reason for Encounter: Disease State / Hypertension Assessment Call   Conditions to be addressed/monitored: HTN  Recent office visits:  01/14/2022 [name] MD - Patient was seen for Chronic respiratory failure with hypoxia and additional issues. Referral to Home Health. Started Benzonatate 200 mg daily as needed. Follow up in 3 months.   12/16/2021 [name] MD - Patient was seen for essential hypertension and additional issues. No medications changes. Follow up in 6 months.  10/27/2021 [name] MD - Patient was seen for COPD mixed type and additional issues. Discontinued Amlodipine, Azithromycin, Potassium and Prednisone.  Return if symptoms worsen or fail to improve  Recent consult visits:  01/16/2022 Zola Button MD (oncology) - Patient was seen for morbid obesity and an additional issue. No medication changes. Follow up in 3 months.   12/16/2021 Roma Kayser RN [facility]) - Patient was seen for SSS (sick sinus syndrome). No medication changes. No follow up noted.   10/03/2021 Zola Button MD (oncology) - Patient was seen for Malignant neoplasm of prostate. No medication changes. Follow up in 3 months.   09/16/2021 Roma Kayser RN (heartcare) -  Patient was seen for SSS (sick sinus syndrome). No medication changes. No follow up noted.   08/18/2021 Baird Lyons MD (pulmonary) - Patient was seen for COPD mixed type and additional issues. No medication changes. Follow up in 6 months.   Hospital visits:  Admitted to [facility] on 12/24/2021 due to Respiratory failure with hypoxia. Discharge date was 01/01/2022. New?Medications Started at [facility] Discharge:?? dexamethasone (DECADRON) metroNIDAZOLE (Flagyl) sulfamethoxazole-trimethoprim (BACTRIM DS) Medication Changes at Hospital Discharge: predniSONE (Lamboglia) Medications  Discontinued at Hospital Discharge: No medications discontinued Medications that remain the same after Hospital Discharge:??  -All other medications will remain the same.    Patient was seen at [facility] on 09/27/2021 (7 hours) due to jaundice.  New?Medications Started at [facility] Discharge:?? No medications started Medication Changes at Hospital Discharge: No medication changes Medications Discontinued at Hospital Discharge: No medications discontinued Medications that remain the same after Hospital Discharge:??  -All other medications will remain the same.    Patient was seen at [facility] on 09/19/2021 (2 hours) due to Upper respiratory tract infection, unspecified type.  New?Medications Started at [facility] Discharge:?? Potassium Chloride ER 20 MEQ Medication Changes at Hospital Discharge: azithromycin 250 MG tablet Stop the 5 mg tabs prednisone until you finish the 40 mg dose Potassium Chloride ER 20 MEQ Medications Discontinued at Hospital Discharge: No medications discontinued Medications that remain the same after Hospital Discharge:??  -All other medications will remain the same.    Medications: Outpatient Encounter Medications as of 01/19/2022  Medication Sig   abiraterone acetate (ZYTIGA) 250 MG tablet TAKE 4 TABLETS (1,000 MG TOTAL) BY MOUTH DAILY. TAKE ON AN EMPTY STOMACH 1 HOUR BEFORE OR 2 HOURS AFTER A MEAL (Patient taking differently: Take 250 mg by mouth daily.)   acetaminophen (TYLENOL) 500 MG tablet Take 1,000 mg by mouth every 6 (six) hours as needed for headache or moderate pain (pain).   albuterol (VENTOLIN HFA) 108 (90 Base) MCG/ACT inhaler TAKE 2 PUFFS BY MOUTH EVERY 6 HOURS AS NEEDED FOR WHEEZE OR SHORTNESS OF BREATH (Patient taking differently: Inhale 2 puffs into the lungs every 6 (six) hours as needed for wheezing or shortness of breath.)   Ascorbic Acid (VITAMIN C PO) Take 1 tablet by mouth daily.  aspirin EC 81 MG tablet Take 81 mg by  mouth daily.   atorvastatin (LIPITOR) 20 MG tablet TAKE 1 TABLET BY MOUTH EVERY DAY   benzonatate (TESSALON) 100 MG capsule Take 2 capsules (200 mg total) by mouth daily as needed.   budesonide-formoterol (SYMBICORT) 160-4.5 MCG/ACT inhaler Inhale 2 puffs then rinse mouth, twice daily (Patient taking differently: Inhale 2 puffs into the lungs 2 (two) times daily. Inhale 2 puffs then rinse mouth, twice daily)   citalopram (CELEXA) 10 MG tablet TAKE 1 TABLET BY MOUTH EVERY DAY (Patient taking differently: Take 10 mg by mouth daily.)   diclofenac Sodium (VOLTAREN) 1 % GEL AAPLY 4 GRAMS TOPICALLY 4 TIMES DAILY   docusate sodium (COLACE) 100 MG capsule Take 100 mg by mouth daily with supper.   fluticasone (FLONASE) 50 MCG/ACT nasal spray PLACE 1 SPRAY INTO BOTH NOSTRILS DAILY AS NEEDED FOR ALLERGIES OR RHINITIS (AT BEDTIME). (Patient taking differently: Place 1 spray into both nostrils at bedtime as needed for allergies or rhinitis.)   furosemide (LASIX) 20 MG tablet TAKE 1 TABLET BY MOUITH DAILY FOR 5 DAYS , THEN DAILY AS NEEDED FOR EDEMA   LORazepam (ATIVAN) 1 MG tablet Take 0.5-1 tablets (0.5-1 mg total) by mouth 2 (two) times daily as needed for anxiety.   metoprolol tartrate (LOPRESSOR) 25 MG tablet TAKE 1 TABLET BY MOUTH TWICE A DAY (Patient taking differently: Take 25 mg by mouth 2 (two) times daily.)   Multiple Vitamin (MULTIVITAMIN WITH MINERALS) TABS tablet Take 0.5 tablets by mouth daily.   omeprazole (PRILOSEC) 20 MG capsule TAKE 1 CAPSULE (20 MG TOTAL) BY MOUTH DAILY BEFORE BREAKFAST. (Patient taking differently: Take 20 mg by mouth daily.)   predniSONE (DELTASONE) 5 MG tablet Take 1 tablet (5 mg total) by mouth daily with breakfast.   Tetrahydrozoline HCl (VISINE OP) Place 1 drop into both eyes daily as needed (red eyes).   No facility-administered encounter medications on file as of 01/19/2022.  Fill History:  abiraterone acetate (ZYTIGA) 250 MG tablet 12/31/2021 30   ALBUTEROL SULFATE  HFA  108 (90 Base) MCG/ACT AERS 10/13/2021 25   ATORVASTATIN 20 MG TABLET 11/23/2021 90   CITALOPRAM HBR 10 MG TABLET 11/23/2021 90   FUROSEMIDE 20 MG TABLET 11/30/2021 90   LORAZEPAM 1 MG TABLET 01/05/2022 30   METOPROLOL TARTRATE 25 MG TAB 11/20/2021 90   OMEPRAZOLE  20 MG CPDR 09/30/2021 90   AMLODIPINE BESYLATE 2.5 MG TAB 12/05/2021 90  Reviewed chart prior to disease state call. Spoke with patient regarding BP  Recent Office Vitals: BP Readings from Last 3 Encounters:  01/16/22 108/74  01/14/22 130/80  01/01/22 (!) 151/94   Pulse Readings from Last 3 Encounters:  01/16/22 88  01/14/22 88  01/01/22 65    Wt Readings from Last 3 Encounters:  01/16/22 228 lb 9.6 oz (103.7 kg)  01/14/22 232 lb 2 oz (105.3 kg)  01/01/22 229 lb 4.5 oz (104 kg)     Kidney Function Lab Results  Component Value Date/Time   CREATININE 0.90 01/16/2022 11:30 AM   CREATININE 0.77 01/01/2022 12:32 AM   CREATININE 0.84 12/31/2021 02:13 AM   CREATININE 1.12 10/03/2021 08:12 AM   CREATININE 1.07 07/31/2020 01:25 PM   CREATININE 0.9 08/27/2017 09:15 AM   CREATININE 0.9 05/27/2017 07:34 AM   GFR 78.21 02/18/2018 09:01 AM   GFRNONAA >60 01/16/2022 11:30 AM   GFRNONAA 66 07/31/2020 01:25 PM   GFRAA 77 07/31/2020 01:25 PM  Latest Ref Rng & Units 01/16/2022   11:30 AM 01/01/2022   12:32 AM 12/31/2021    2:13 AM  BMP  Glucose 70 - 99 mg/dL 149   123   91    BUN 8 - 23 mg/dL '15   13   18    '$ Creatinine 0.61 - 1.24 mg/dL 0.90   0.77   0.84    Sodium 135 - 145 mmol/L 136   137   136    Potassium 3.5 - 5.1 mmol/L 4.4   4.1   3.8    Chloride 98 - 111 mmol/L 99   109   108    CO2 22 - 32 mmol/L '29   20   22    '$ Calcium 8.9 - 10.3 mg/dL 9.1   8.5   8.2      Current antihypertensive regimen:  Metoprolol 25 mg twice daily  How often are you checking your Blood Pressure? Patients wife states she is checking his blood pressure twice a week.   Current home BP readings: Patients readings for the  past week were 93/64, 101/77, 91/62.  What recent interventions/DTPs have been made by any provider to improve Blood Pressure control since last CPP Visit: No recent interventions  Any recent hospitalizations or ED visits since last visit with CPP? Hospital admission on 12/24/2021  What diet changes have been made to improve Blood Pressure Control?  Patient follows no specific diet Breakfast - patient will have an egg with toast and sometimes bacon Lunch - patient will have sandwich Dinner - patient will have mostly vegetables and some meat, his wife states he doesn't like meat  What exercise is being done to improve your Blood Pressure Control?  Patients wife states they are trying to get him up once a day to walk outside to the mailbox.   Adherence Review: Is the patient currently on ACE/ARB medication? No Does the patient have >5 day gap between last estimated fill dates? No  Care Gaps: AWV -  scheduled for 07/23/2022 Last BP - 108/74 on 01/16/2022 Zoster vaccines - never done   Star Rating Drugs: Atorvastatin '20mg'$  - last filled on 11/23/2021 90DS at [place] Pharmacist Assistant [phone]

## 2022-01-27 ENCOUNTER — Other Ambulatory Visit (HOSPITAL_COMMUNITY): Payer: Self-pay

## 2022-01-28 ENCOUNTER — Ambulatory Visit
Admit: 2022-01-28 | Discharge: 2022-01-28 | Disposition: A | Discharge status: Home / Self Care | Payer: Medicare Other | Attending: Surgery | Admitting: Surgery

## 2022-01-28 ENCOUNTER — Ambulatory Visit: Payer: Medicare Other | Admitting: Surgery

## 2022-01-28 ENCOUNTER — Encounter: Payer: Self-pay | Admitting: Surgery

## 2022-01-28 ENCOUNTER — Other Ambulatory Visit (HOSPITAL_COMMUNITY): Payer: Self-pay

## 2022-01-28 VITALS — BP 96/66 | HR 86 | Resp 20 | Ht 68.0 in | Wt 228.0 lb

## 2022-01-28 DIAGNOSIS — K4020 Bilateral inguinal hernia, without obstruction or gangrene, not specified as recurrent: Secondary | ICD-10-CM | POA: Diagnosis not present

## 2022-01-28 DIAGNOSIS — M19011 Primary osteoarthritis, right shoulder: Secondary | ICD-10-CM | POA: Diagnosis not present

## 2022-01-28 DIAGNOSIS — I714 Abdominal aortic aneurysm, without rupture, unspecified: Secondary | ICD-10-CM | POA: Diagnosis not present

## 2022-01-28 DIAGNOSIS — I7121 Aneurysm of the ascending aorta, without rupture: Secondary | ICD-10-CM

## 2022-01-28 DIAGNOSIS — N3289 Other specified disorders of bladder: Secondary | ICD-10-CM | POA: Diagnosis not present

## 2022-01-28 DIAGNOSIS — I2510 Atherosclerotic heart disease of native coronary artery without angina pectoris: Secondary | ICD-10-CM | POA: Diagnosis not present

## 2022-01-28 DIAGNOSIS — K8689 Other specified diseases of pancreas: Secondary | ICD-10-CM | POA: Diagnosis not present

## 2022-01-28 DIAGNOSIS — J439 Emphysema, unspecified: Secondary | ICD-10-CM | POA: Diagnosis not present

## 2022-01-28 MED ORDER — IOPAMIDOL (ISOVUE-370) INJECTION 76%
75.0000 mL | Freq: Once | INTRAVENOUS | Status: AC | PRN
  Administered 2022-01-28: 75 mL via INTRAVENOUS

## 2022-01-28 NOTE — Progress Notes (Signed)
? ? ?HPI: ? 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  He also has advanced prostate cancer with lymphadenopathy diagnosed in 2017 and followed by Dr. [name] on [place].  He had no sign of disease progression at his last visit with Dr. [name] on 01/16/2022.  He did have an admission for 8 days in April for respiratory illness and shortness of breath with COVID-19 pneumonia.  He also had E. coli bacteremia.  He recovered from that and was discharged and has been recovering at home without any major problems. ? ?Current Outpatient Medications  ?Medication Sig Dispense Refill  ? abiraterone acetate (ZYTIGA) 250 MG tablet TAKE 4 TABLETS (1,000 MG TOTAL) BY MOUTH DAILY. TAKE ON AN EMPTY STOMACH 1 HOUR BEFORE OR 2 HOURS AFTER A MEAL (Patient taking differently: Take 250 mg by mouth daily.) 120 tablet 2  ? acetaminophen (TYLENOL) 500 MG tablet Take 1,000 mg by mouth every 6 (six) hours as needed for headache or moderate pain (pain).    ? albuterol (VENTOLIN HFA) 108 (90 Base) MCG/ACT inhaler TAKE 2 PUFFS BY MOUTH EVERY 6 HOURS AS NEEDED FOR WHEEZE OR SHORTNESS OF BREATH (Patient taking differently: Inhale 2 puffs into the lungs every 6 (six) hours as needed for wheezing or shortness of breath.) 18 each 12  ? Ascorbic Acid (VITAMIN C PO) Take 1 tablet by mouth daily.    ? aspirin EC 81 MG tablet Take 81 mg by mouth daily.    ? atorvastatin (LIPITOR) 20 MG tablet TAKE 1 TABLET BY MOUTH EVERY DAY 90 tablet 0  ? benzonatate (TESSALON) 100 MG capsule Take 2 capsules (200 mg total) by mouth daily as needed. 30 capsule 1  ? budesonide-formoterol (SYMBICORT) 160-4.5 MCG/ACT inhaler Inhale 2 puffs then rinse mouth, twice daily (Patient taking differently: Inhale 2 puffs into the  lungs 2 (two) times daily. Inhale 2 puffs then rinse mouth, twice daily) 1 each 12  ? citalopram (CELEXA) 10 MG tablet TAKE 1 TABLET BY MOUTH EVERY DAY (Patient taking differently: Take 10 mg by mouth daily.) 90 tablet 3  ? diclofenac Sodium (VOLTAREN) 1 % GEL AAPLY 4 GRAMS TOPICALLY 4 TIMES DAILY 400 g 2  ? docusate sodium (COLACE) 100 MG capsule Take 100 mg by mouth daily with supper.    ? fluticasone (FLONASE) 50 MCG/ACT nasal spray PLACE 1 SPRAY INTO BOTH NOSTRILS DAILY AS NEEDED FOR ALLERGIES OR RHINITIS (AT BEDTIME). (Patient taking differently: Place 1 spray into both nostrils at bedtime as needed for allergies or rhinitis.) 48 mL 1  ? furosemide (LASIX) 20 MG tablet TAKE 1 TABLET BY MOUITH DAILY FOR 5 DAYS , THEN DAILY AS NEEDED FOR EDEMA 90 tablet 1  ? LORazepam (ATIVAN) 1 MG tablet Take 0.5-1 tablets (0.5-1 mg total) by mouth 2 (two) times daily as needed for anxiety. 60 tablet 3  ? metoprolol tartrate (LOPRESSOR) 25 MG tablet TAKE 1 TABLET BY MOUTH TWICE A DAY (Patient taking differently: Take 25 mg by mouth 2 (two) times daily.) 180 tablet 2  ? Multiple Vitamin (MULTIVITAMIN WITH MINERALS) TABS tablet Take 0.5 tablets by mouth daily.    ? omeprazole (PRILOSEC) 20 MG capsule TAKE 1 CAPSULE (20 MG TOTAL) BY MOUTH DAILY BEFORE BREAKFAST. (Patient taking differently: Take  20 mg by mouth daily.) 90 capsule 3  ? predniSONE (DELTASONE) 5 MG tablet Take 1 tablet (5 mg total) by mouth daily with breakfast. 90 tablet 3  ? Tetrahydrozoline HCl (VISINE OP) Place 1 drop into both eyes daily as needed (red eyes).    ? ?No current facility-administered medications for this visit.  ? ? ? ?Physical Exam: ?BP 96/66   Pulse 86   Resp 20   Ht '5\' 8"'$  (1.727 m)   Wt 228 lb (103.4 kg)   SpO2 91% Comment: RA  BMI 34.67 kg/m?  ?He looks well. ?Cardiac exam shows a regular rate and rhythm with normal heart sounds.  There is no murmur. ?Lungs are clear. ?There is no peripheral edema. ? ?Diagnostic Tests: ? ?Narrative &  Impression  ?CLINICAL DATA:  Aortic aneurysm, known or suspected; Aortic aneurysm ?(AAA), surveillance ?  ?EXAM: ?CT ANGIOGRAPHY CHEST, ABDOMEN AND PELVIS ?  ?TECHNIQUE: ?Multidetector CT imaging through the chest, abdomen and pelvis was ?performed using the standard protocol during bolus administration of ?intravenous contrast. Multiplanar reconstructed images and MIPs were ?obtained and reviewed to evaluate the vascular anatomy. ?  ?RADIATION DOSE REDUCTION: This exam was performed according to the ?departmental dose-optimization program which includes automated ?exposure control, adjustment of the mA and/or kV according to ?patient size and/or use of iterative reconstruction technique. ?  ?CONTRAST:  107m ISOVUE-370 IOPAMIDOL (ISOVUE-370) INJECTION 76% ?  ?COMPARISON:  12/26/2021 and previous ?  ?FINDINGS: ?CTA CHEST FINDINGS ?  ?Cardiovascular: Left subclavian transvenous leads extend to the ?right atrium and towards the RV apex. Heart size normal. No ?pericardial effusion. The RV is nondilated. Satisfactory ?opacification of pulmonary arteries noted, and there is no evidence ?of pulmonary emboli. Moderate coronary calcifications. Good contrast ?opacification of the thoracic aorta. No dissection or stenosis. ?Classic 3 vessel brachiocephalic arterial origin anatomy without ?proximal stenosis. ?  ?Aortic Root: ?  ?--Valve: 2.5 cm ?  ?--Sinuses: 4.1 cm ?  ?--Sinotubular Junction: 3.3 cm ?  ?Limitations by motion: Moderate ?  ?Thoracic Aorta: ?  ?--Ascending Aorta: 4.6 cm (previously 4.5) ?  ?--Aortic Arch: 4.0 cm ?  ?--Descending Aorta: 3.6 cm ?  ?Scattered calcified plaque through the thoracic aorta. Distal ?descending segment is tortuous. ?  ?Mediastinum/Nodes: No mediastinal hematoma, mass or adenopathy. ?  ?Lungs/Pleura: No pleural effusion. Pulmonary emphysema. No ?pneumothorax. ?  ?Musculoskeletal: Anterior vertebral endplate spurring at multiple ?levels in the mid and lower thoracic spine. Bilateral  shoulder DJD ?  ?Review of the MIP images confirms the above findings. ?  ?CTA ABDOMEN AND PELVIS FINDINGS ?  ?VASCULAR ?  ?Aorta: Moderate calcified atheromatous plaque. Fusiform infrarenal ?aneurysm 4.2 x 3.8 cm maximum transverse dimensions (previously 4.2) ?with eccentric nonocclusive mural thrombus. Aorta regains normal ?caliber above the bifurcation. No dissection or stenosis. ?  ?Celiac: High-grade ostial stenosis extending over length of ?approximately 1.9 cm, of probable hemodynamic significance. Distal ?branches patent. ?  ?SMA: Calcified ostial plaque without significant stenosis, patent ?distally with classic distal branch anatomy. Enlarged GDA provides ?collateral supply to the celiac distribution. ?  ?Renals: Single bilaterally, both with scattered calcified plaque but ?no stenosis. ?  ?IMA: Origin occlusion, diminutive distally and supplied by ?mesenteric collaterals. ?  ?Inflow: Moderate calcified iliac arterial plaque bilaterally without ?aneurysm, dissection or stenosis. ?  ?Veins: Patent hepatic veins, portal vein, SM V, splenic vein, ?bilateral renal veins. No venous pathology evident. ?  ?Review of the MIP images confirms the above findings. ?  ?NON-VASCULAR ?  ?Hepatobiliary: Densities in  the dependent aspect of the gallbladder ?suggests dating small partially calcified gallstones. No focal liver ?lesion or biliary ductal dilatation. ?  ?Pancreas: Marked parenchymal atrophy without mass or ductal ?dilatation. ?  ?Spleen: Normal in size without focal abnormality. ?  ?Adrenals/Urinary Tract: No adrenal mass. 5.6 cm exophytic left lower ?pole cyst; no follow-up required. Chronic mid right renal ?parenchymal calcification, stable since 2017; no follow-up required. ?No hydronephrosis. Urinary bladder incompletely distended. ?  ?Stomach/Bowel: Stomach is incompletely distended, unremarkable. ?Small bowel decompressed. Post appendectomy. The colon is ?nondilated. Scattered distal descending and  sigmoid diverticula ?without adjacent inflammatory/edematous change. ?  ?Lymphatic: No abdominal or pelvic adenopathy. ?  ?Reproductive: Multiple metallic seeds in and around the prostate. ?  ?Other: No ascites.  No free air.

## 2022-02-05 DIAGNOSIS — G4733 Obstructive sleep apnea (adult) (pediatric): Secondary | ICD-10-CM | POA: Diagnosis not present

## 2022-02-10 DIAGNOSIS — K8020 Calculus of gallbladder without cholecystitis without obstruction: Secondary | ICD-10-CM | POA: Diagnosis not present

## 2022-02-12 DIAGNOSIS — G4733 Obstructive sleep apnea (adult) (pediatric): Secondary | ICD-10-CM | POA: Diagnosis not present

## 2022-02-12 DIAGNOSIS — R269 Unspecified abnormalities of gait and mobility: Secondary | ICD-10-CM | POA: Diagnosis not present

## 2022-02-13 NOTE — Progress Notes (Signed)
HPI M former smoker followed for OSA, COPD, hx Respiratory Failure, dCHF w pulmonary edema, hx E.Coli Bacteremia, CAD, Chronic AFib/ Pacemaker, Prostate Cancer, Aortic Aneurysm HST 09/02/20- AHI 40.8/ hr, desaturation to 66%/ avbg 88%, body weight 228 lbs Pending CPAP titration w O2 check on 2/22 PFT 09/09/20- Minimal obstruction CPAP titration 11/05/20- to BIPAP 25/21 and required O2 3l ===================================================================== .  08/18/21- 77 yoM former smoker followed for OSA, COPD, hx Respiratory Failure, dCHF w pulmonary edema, hx E.Coli Bacteremia, Chronic AFib/ Pacemaker, Prostate Cancer, Aortic Aneurysms. Celiac Stenosis,  -Symbicort 160, Proair hfa   Prednisone 5 mg daily(Heme/Onc) AutoBIPAP EPAP 4-8, IPAP 14-20, PS 2, with O2 3L for sleep/ [employer] Download-compliance 86.7%, AHI 5/ hr Aortic aneurysms followed by Dr Cyndia Bent Body weight today-228 lbs Covid vax-3 PHIZER Flu vax-had [facility] 10/30 - CAP/ Sepsis, RSV infection, COPD exacerb, Resp Failure, Peripheral edema, He and his wife describe frequent choking and strangling while swallowing food or water.  This suggest possibility recent pneumonia was aspiration although he was told it was "RSV".  They have no exposure to small children.  He sleeps comfortably with his BiPAP machine.  Rarely uses rescue inhaler. CXR 07/13/21 1V- IMPRESSION: Minimal reticular densities are noted in the perihilar and basilar regions bilaterally which may represent scarring or atelectasis, but acute superimposed edema or atypical inflammation cannot be excluded.  02/16/22- 4 yoM former smoker followed for OSA, COPD, hx Respiratory Failure, dCHF w pulmonary edema, hx E.Coli Bacteremia, Chronic AFib/ Pacemaker, Prostate Cancer, Aortic Aneurysms. Celiac Stenosis, Obesity, Covid pneumonia April2023, Cholelithiasis,  -Symbicort 160, Proair hfa   Prednisone 5 mg daily(Heme/Onc) AutoBIPAP EPAP 4-8, IPAP 14-20, PS 2, with O2 3L for sleep/  [employer] Download-compliance   86.7%, AHI 5/ hr Aortic aneurysms followed by Dr Cyndia Bent Body weight today-223 lbs Covid vax-4 PHIZER                            Wife here Flu vax had -----Follow up. Patient says he can't walk too far without breathing so hard.  Recent Hgb 12.6 Download reviewed.  He does well with BiPAP.  Has been taking machine off if he is coughing much or in thunderstorms.  We discussed this.  [employer] sent replacement filters and wife does not think they fit.  I suggested she call [employer] or make an appointment and take the machine over so that [employer] staff can look at it, get correct filters if necessary, and teach her. He is taking  benzonatate for persistent cough with clear sputum, no blood or fever.  Does not think he has an infection.  Thinks shortness of breath with exertion may be worse since Christmas.  Had COVID infection in April.  He continues to use Symbicort and albuterol inhalers.  Walk test today shows he does qualify for portable O2 at 2 L. -Wife says he needs preoperative clearance for cholecystectomy.  I have not seen a form yet.  He is obviously frail with multiple morbidities but I think he is stable currently without acute correctable respiratory problem and can go ahead with necessary surgery.  He is above average risk and pulmonary can be consulted in hospital if necessary.  He has already been evaluated by cardiology. Walk Test on Room Air- O2 qualifying 02/16/22- min sat 87% after 2 laps, Max HR 92 (paced).  CTachest aorta 01/28/22 IMPRESSION: 1. 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 Recommend semi-annual imaging followup by CTA or MRA and  referral to cardiothoracic surgery if not already obtained. This recommendation follows 2010 ACCF/AHA/AATS/ACR/ASA/SCA/SCAI/SIR/STS/SVM Guidelines for the Diagnosis and Management of Patients With Thoracic Aortic Disease. Circulation. 2010; 121: R518-A416. Aortic aneurysm NOS (ICD10-I71.9) 2. 4.2 cm  infrarenal abdominal aortic aneurysm, stable. Recommend follow-up every 12 months and vascular consultation. This recommendation follows ACR consensus guidelines: White Paper of the ACR Incidental Findings Committee II on Vascular Findings. J Am Coll Radiol 2013; 10:789-794. 3. Coronary and Aortic Atherosclerosis (ICD10-170.0). 4.  Emphysema (ICD10-J43.9). 5. Scattered colonic diverticula 6. Possible cholelithiasis   ROS-see HPI   + = positive Constitutional:    weight loss, night sweats, fevers, chills, fatigue, lassitude. HEENT:    headaches, difficulty swallowing, tooth/dental problems, sore throat,       sneezing, itching, ear ache, nasal congestion, post nasal drip, snoring CV:    chest pain, orthopnea, PND, swelling in lower extremities, anasarca,                                   dizziness, +palpitations Resp:   +shortness of breath with exertion or at rest.                productive cough,   +non-productive cough, coughing up of blood.              change in color of mucus.  wheezing.   Skin:    rash or lesions. GI:  + heartburn, indigestion, abdominal pain, nausea, vomiting, diarrhea,                 change in bowel habits, loss of appetite GU: dysuria, change in color of urine, no urgency or frequency.   flank pain. MS:   joint pain, stiffness, decreased range of motion, back pain. Neuro-     nothing unusual Psych:  change in mood or affect.  depression or +anxiety.   memory loss.  OBJ- Physical Exam General- Alert, Oriented, Affect-appropriate, Distress- none acute, + obese Skin- rash-none, lesions- none, excoriation- none Lymphadenopathy- none Head- atraumatic            Eyes- Gross vision intact, PERRLA, conjunctivae and secretions clear            Ears- Hearing, canals-normal            Nose- Clear, no-Septal dev, mucus, polyps, erosion, perforation             Throat- Mallampati IV , mucosa clear , drainage- none, tonsils- atrophic, + teeth Neck- flexible , trachea  midline, no stridor , thyroid nl, carotid no bruit Chest - symmetrical excursion , unlabored           Heart/CV- RRR , no murmur , no gallop  , no rub, nl s1 s2                           - JVD- none , edema+trace, stasis changes- none, varices- none           Lung- + crackles/ rales in bases, wheeze- none, cough- none , dullness-none, rub- none           Chest wall- +pacemaker L  Abd-  Br/ Gen/ Rectal- Not done, not indicated Extrem- cyanosis- none, clubbing, none, atrophy- none, strength- nl, + cane Neuro- grossly intact to observation

## 2022-02-16 ENCOUNTER — Encounter: Payer: Self-pay | Admitting: Internal Medicine

## 2022-02-16 ENCOUNTER — Ambulatory Visit: Payer: Medicare Other | Admitting: Internal Medicine

## 2022-02-16 VITALS — BP 92/68 | HR 84 | Temp 97.6°F | Ht 68.0 in | Wt 223.2 lb

## 2022-02-16 DIAGNOSIS — G4733 Obstructive sleep apnea (adult) (pediatric): Secondary | ICD-10-CM | POA: Diagnosis not present

## 2022-02-16 DIAGNOSIS — Z01818 Encounter for other preprocedural examination: Secondary | ICD-10-CM

## 2022-02-16 DIAGNOSIS — J9611 Chronic respiratory failure with hypoxia: Secondary | ICD-10-CM | POA: Diagnosis not present

## 2022-02-16 DIAGNOSIS — J449 Chronic obstructive pulmonary disease, unspecified: Secondary | ICD-10-CM

## 2022-02-16 NOTE — Assessment & Plan Note (Signed)
Benefits from BiPAP with appropriate compliance and control. Plan-continue auto BiPAP with supplemental O2 3 L

## 2022-02-16 NOTE — Patient Instructions (Addendum)
We can continue autoBIPAP at current settings  You can contact [facility] about where to put the filters in his BIPAP machine. If they sent the wrong ones, it may be worthwhile to get directions to their nearest office and take the machine and filters they sent, so they can show you in person.  Order- O2 qualifying walk test   dx Dyspnea on Exertion  Order- DME [facility]- please add light portable O2 2L  for dx chronic respiratory failure with hypoxia  We can continue current inhalers  Please call if I can he

## 2022-02-16 NOTE — Assessment & Plan Note (Signed)
Already has O2 at 3 L through his BiPAP machine.  Walk test on room air today qualifies for addition of light portable oxygen 2 L. Plan-order light portable O2 2 L

## 2022-02-16 NOTE — Assessment & Plan Note (Signed)
Wife indicates he will be needing pulmonary preoperative risk assessment for cholecystectomy.  He is chronically frail with multiple comorbidities.  Adding portable oxygen today.  Recent CT chest for aortic aneurysm did not indicate an acute pulmonary parenchymal process. Plan-he will be at higher than average surgical risk, but acute correctable pulmonary problems or not identified.  Okay from a pulmonary/sleep medicine standpoint to proceed with necessary surgery.  He should wear his BiPAP machine in recovery and while sleeping.  Pulmonary consultation will be available in hospital if needed.

## 2022-02-16 NOTE — Assessment & Plan Note (Signed)
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  Cannot rule out some microaspiration.  Benzonatate seems to help.  He continues inhalers. Plan-watch for need to adjust treatment.

## 2022-02-18 ENCOUNTER — Other Ambulatory Visit (HOSPITAL_COMMUNITY): Payer: Self-pay

## 2022-02-20 ENCOUNTER — Other Ambulatory Visit (HOSPITAL_COMMUNITY): Payer: Self-pay

## 2022-02-26 ENCOUNTER — Telehealth: Payer: Self-pay | Admitting: Internal Medicine

## 2022-02-26 ENCOUNTER — Other Ambulatory Visit (HOSPITAL_COMMUNITY): Payer: Self-pay

## 2022-02-26 NOTE — Telephone Encounter (Signed)
Fax received from Dr. Michaelle Birks with [facility] to perform a cholecystectomy on patient.  Patient needs surgery clearance. Patient was seen on 02/16/2022. Office protocol is a risk assessment can be sent to surgeon if patient has been seen in 60 days or less.   Sending to Dr. Annamaria Boots for risk assessment or recommendations if patient needs to be seen in office prior to surgical procedure.

## 2022-02-26 NOTE — Telephone Encounter (Signed)
OV notes and clearance form have been faxed back to [facility]. Nothing further needed at this time.  

## 2022-03-03 ENCOUNTER — Telehealth: Payer: Self-pay | Admitting: Internal Medicine

## 2022-03-03 DIAGNOSIS — J9611 Chronic respiratory failure with hypoxia: Secondary | ICD-10-CM

## 2022-03-05 NOTE — Telephone Encounter (Signed)
Called [facility] and spoke with Alyse Low and was informed that the order for oxygen that was placed on 02/16/22 was put in wrong. She stated that we need to do another order to clarify the original order. Added Care Coordination note to reflect walk on POC. Order placed. Nothing further needed

## 2022-03-06 ENCOUNTER — Telehealth: Payer: Self-pay | Admitting: Family Medicine

## 2022-03-06 ENCOUNTER — Other Ambulatory Visit: Payer: Self-pay | Admitting: Family Medicine

## 2022-03-06 DIAGNOSIS — F418 Other specified anxiety disorders: Secondary | ICD-10-CM

## 2022-03-09 ENCOUNTER — Telehealth: Payer: Self-pay | Admitting: Internal Medicine

## 2022-03-09 ENCOUNTER — Other Ambulatory Visit: Payer: Self-pay | Admitting: Family Medicine

## 2022-03-09 DIAGNOSIS — R053 Chronic cough: Secondary | ICD-10-CM

## 2022-03-09 NOTE — Telephone Encounter (Signed)
Called and spoke with patient's wife [name]. She stated that she has been calling [employer] for the past few days for an update on patient's POC order. She has been told several different stories from [employer] does not have the order, they have the order but need more information, etc. I advised her that I would check with [employer] myself tomorrow morning to see what is going on with the order. She verbalized understanding.

## 2022-03-10 ENCOUNTER — Telehealth: Payer: Self-pay | Admitting: Oncology

## 2022-03-10 ENCOUNTER — Other Ambulatory Visit (HOSPITAL_COMMUNITY): Payer: Self-pay

## 2022-03-10 NOTE — Telephone Encounter (Signed)
Called and spoke with [name]. She stated that [facility] will not process his POC order and they want the patient to pay $2400 out of pocket. She contacted [facility] and they stated would be able to process the POC order once they receive it.   Dr. Maple Hudson, please advise if you are ok with [place] sending the POC order to [facility]. Thanks!

## 2022-03-11 NOTE — Telephone Encounter (Signed)
Left message for Tony [name] to call back.

## 2022-03-11 NOTE — Telephone Encounter (Signed)
Ok to order [employer] POC 3L pulse   dx chronic respiratory failure with hypoxemia

## 2022-03-12 NOTE — Telephone Encounter (Signed)
Called [facility] and they are going to pull the POC order that was ordered 6/22. [facility] was stating they needed the ok to pull it even though it states [facility]. Nothing further needed

## 2022-03-12 NOTE — Telephone Encounter (Signed)
Ashly from [facility] states needs order for POC sent to them. Ashly phone number is [phone].

## 2022-03-13 DIAGNOSIS — J9611 Chronic respiratory failure with hypoxia: Secondary | ICD-10-CM | POA: Diagnosis not present

## 2022-03-14 DIAGNOSIS — R269 Unspecified abnormalities of gait and mobility: Secondary | ICD-10-CM | POA: Diagnosis not present

## 2022-03-14 DIAGNOSIS — G4733 Obstructive sleep apnea (adult) (pediatric): Secondary | ICD-10-CM | POA: Diagnosis not present

## 2022-03-18 ENCOUNTER — Ambulatory Visit (INDEPENDENT_AMBULATORY_CARE_PROVIDER_SITE_OTHER): Payer: Medicare Other

## 2022-03-18 DIAGNOSIS — I495 Sick sinus syndrome: Secondary | ICD-10-CM

## 2022-03-19 LAB — CUP PACEART REMOTE DEVICE CHECK
Battery Remaining Longevity: 119 mo
Battery Voltage: 3.01 V
Brady Statistic AP VP Percent: 0.05 %
Brady Statistic AP VS Percent: 80.07 %
Brady Statistic AS VP Percent: 0.01 %
Brady Statistic AS VS Percent: 19.87 %
Brady Statistic RA Percent Paced: 79.74 %
Brady Statistic RV Percent Paced: 0.06 %
Date Time Interrogation Session: 20230705025236
Implantable Lead Implant Date: 20191001
Implantable Lead Implant Date: 20191001
Implantable Lead Location: 753859
Implantable Lead Location: 753860
Implantable Lead Model: 5076
Implantable Lead Model: 5076
Implantable Pulse Generator Implant Date: 20191001
Lead Channel Impedance Value: 285 Ohm
Lead Channel Impedance Value: 285 Ohm
Lead Channel Impedance Value: 380 Ohm
Lead Channel Impedance Value: 418 Ohm
Lead Channel Pacing Threshold Amplitude: 0.375 V
Lead Channel Pacing Threshold Amplitude: 0.625 V
Lead Channel Pacing Threshold Pulse Width: 0.4 ms
Lead Channel Pacing Threshold Pulse Width: 0.4 ms
Lead Channel Sensing Intrinsic Amplitude: 1.875 mV
Lead Channel Sensing Intrinsic Amplitude: 1.875 mV
Lead Channel Sensing Intrinsic Amplitude: 2.375 mV
Lead Channel Sensing Intrinsic Amplitude: 2.375 mV
Lead Channel Setting Pacing Amplitude: 1.5 V
Lead Channel Setting Pacing Amplitude: 2.5 V
Lead Channel Setting Pacing Pulse Width: 0.4 ms
Lead Channel Setting Sensing Sensitivity: 0.9 mV

## 2022-03-20 ENCOUNTER — Telehealth: Payer: Self-pay | Admitting: Cardiovascular Disease

## 2022-03-20 NOTE — Telephone Encounter (Addendum)
Left message for the pt to call for in office appt for pre op clearance. Requesting office stated on clearance request if we could expedite as original clearance was sent 02/10/22. However, we had not received the clearance stated back on 02/10/22.

## 2022-03-20 NOTE — Telephone Encounter (Signed)
   Name: Tony [name] [facility]  DOB: 1941-04-04  MRN: [id number]  Primary Cardiologist: Sanda Klein, MD  Chart reviewed as part of pre-operative protocol coverage. Because of Anup Brigham Betts's past medical history and time since last visit, he will require a follow-up in-office visit in order to better assess preoperative cardiovascular risk.  I do not see a prior request dated for 02/10/2022 as mentioned below.  Pre-op covering staff: - Please schedule appointment and call patient to inform them. If patient already had an upcoming appointment within acceptable timeframe, please add "pre-op clearance" to the appointment notes so provider is aware. - Please contact requesting surgeon's office via preferred method (i.e, phone, fax) to inform them of need for appointment prior to surgery.    Lenna Sciara, NP  03/20/2022, 1:55 PM

## 2022-03-20 NOTE — Telephone Encounter (Signed)
Pt has appt in office 04/13/22. I have added pre op clearance as well to appt notes.I will update the requesting office the pt has appt.

## 2022-03-20 NOTE — Telephone Encounter (Signed)
   Pre-operative Risk Assessment    Patient Name: [name] [facility]  DOB: 05/11/1941 MRN: [id number]      Request for Surgical Clearance    Procedure:  Cholecystectomy  Date of Surgery:  Clearance TBD                                 Surgeon:    Dr. Michaelle Birks Surgeon's Group or Practice Name:  [facility] Phone number:  [phone] Fax number:  [phone]   Type of Clearance Requested:   - Medical    Type of Anesthesia:  Not Indicated   Additional requests/questions:    Please expedite as the original request was sent 02/10/22.  Signed, Heloise Beecham   03/20/2022, 1:33 PM

## 2022-03-21 DIAGNOSIS — J441 Chronic obstructive pulmonary disease with (acute) exacerbation: Secondary | ICD-10-CM | POA: Diagnosis not present

## 2022-03-23 ENCOUNTER — Telehealth: Payer: Self-pay | Admitting: Internal Medicine

## 2022-03-23 DIAGNOSIS — J9611 Chronic respiratory failure with hypoxia: Secondary | ICD-10-CM

## 2022-03-23 NOTE — Telephone Encounter (Signed)
Order placed per Dr Bertrum Sol approval to cancel [facility] and start with [facility]. Last office visit within 60 days. Dr Annamaria Boots is aware and states approval via message. Nothing further needed

## 2022-03-23 NOTE — Telephone Encounter (Signed)
Yes thanks. Please change his O2 and CPAP orders over to [facility] from [facility] per patient request.

## 2022-03-23 NOTE — Telephone Encounter (Signed)
Called and spoke to patient and he states that he is no longer using [employer] and now he is using [facility].   Sir are you ok with [place] discontinuing [employer] for patient.   We have current settings for [employer] (25/21) and he is on 2L of oxygen via POC.   We need and order to get everything switched over. And you ok with this order sir.   Please advise

## 2022-03-25 ENCOUNTER — Telehealth: Payer: Self-pay | Admitting: Internal Medicine

## 2022-03-25 DIAGNOSIS — J9611 Chronic respiratory failure with hypoxia: Secondary | ICD-10-CM

## 2022-03-25 NOTE — Telephone Encounter (Signed)
Called patient and verified multiple times that he is wanting to end order with [employer] since he has services through [facility] now. Patient verbalized that this was correct. Order placed to end [facility] oxygen since he has service with [facility] Nothing further needed at this time

## 2022-03-28 ENCOUNTER — Other Ambulatory Visit: Payer: Self-pay | Admitting: Cardiovascular Disease

## 2022-03-30 ENCOUNTER — Telehealth: Payer: Self-pay | Admitting: Internal Medicine

## 2022-03-30 NOTE — Telephone Encounter (Signed)
Attempted to call pt's spouse [name] but unable to reach and unable to leave VM. Will try to call back later.

## 2022-03-31 NOTE — Telephone Encounter (Signed)
Order was mistakenly sent to [facility] on 7/13 and they stated pt was just started on O2.  Order should have been sent to [facility] to discontinue.  I have sent message to [employer] today.  Called pt to make aware and left vm with my phone # to call me back if there are any questions.

## 2022-03-31 NOTE — Telephone Encounter (Signed)
Routing to [facility] per protocol

## 2022-04-07 ENCOUNTER — Other Ambulatory Visit (HOSPITAL_COMMUNITY): Payer: Self-pay

## 2022-04-08 NOTE — Progress Notes (Signed)
Remote pacemaker transmission.   

## 2022-04-09 ENCOUNTER — Other Ambulatory Visit (HOSPITAL_COMMUNITY): Payer: Self-pay

## 2022-04-09 ENCOUNTER — Other Ambulatory Visit: Payer: Self-pay | Admitting: Oncology

## 2022-04-09 DIAGNOSIS — C61 Malignant neoplasm of prostate: Secondary | ICD-10-CM

## 2022-04-09 MED ORDER — ABIRATERONE ACETATE 250 MG PO TABS
ORAL_TABLET | Freq: Every day | ORAL | 2 refills | Status: DC
  Filled 2022-04-09: qty 120, 30d supply, fill #0
  Filled 2022-05-06: qty 120, 30d supply, fill #1
  Filled 2022-06-01: qty 120, 30d supply, fill #2

## 2022-04-12 DIAGNOSIS — J9611 Chronic respiratory failure with hypoxia: Secondary | ICD-10-CM | POA: Diagnosis not present

## 2022-04-13 ENCOUNTER — Encounter: Payer: Self-pay | Admitting: Cardiovascular Disease

## 2022-04-13 ENCOUNTER — Other Ambulatory Visit (HOSPITAL_COMMUNITY): Payer: Self-pay

## 2022-04-13 ENCOUNTER — Ambulatory Visit (INDEPENDENT_AMBULATORY_CARE_PROVIDER_SITE_OTHER): Payer: Medicare Other | Admitting: Cardiovascular Disease

## 2022-04-13 VITALS — BP 104/76 | HR 70 | Ht 68.0 in | Wt 220.0 lb

## 2022-04-13 DIAGNOSIS — I7121 Aneurysm of the ascending aorta, without rupture: Secondary | ICD-10-CM | POA: Diagnosis not present

## 2022-04-13 DIAGNOSIS — E7800 Pure hypercholesterolemia, unspecified: Secondary | ICD-10-CM

## 2022-04-13 DIAGNOSIS — Z950 Presence of cardiac pacemaker: Secondary | ICD-10-CM

## 2022-04-13 DIAGNOSIS — I7143 Infrarenal abdominal aortic aneurysm, without rupture: Secondary | ICD-10-CM | POA: Diagnosis not present

## 2022-04-13 DIAGNOSIS — I2510 Atherosclerotic heart disease of native coronary artery without angina pectoris: Secondary | ICD-10-CM

## 2022-04-13 DIAGNOSIS — I10 Essential (primary) hypertension: Secondary | ICD-10-CM

## 2022-04-13 DIAGNOSIS — I453 Trifascicular block: Secondary | ICD-10-CM | POA: Diagnosis not present

## 2022-04-13 DIAGNOSIS — I495 Sick sinus syndrome: Secondary | ICD-10-CM | POA: Diagnosis not present

## 2022-04-13 DIAGNOSIS — C61 Malignant neoplasm of prostate: Secondary | ICD-10-CM

## 2022-04-13 NOTE — Patient Instructions (Signed)
Medication Instructions:  STOP the Amlodipine  *If you need a refill on your cardiac medications before your next appointment, please call your pharmacy*   Lab Work: None ordered If you have labs (blood work) drawn today and your tests are completely normal, you will receive your results only by: [place] (if you have MyChart) OR A paper copy in the mail If you have any lab test that is abnormal or we need to change your treatment, we will call you to review the results.   Testing/Procedures: None ordered   Follow-Up: At [facility], you and your health needs are our priority.  As part of our continuing mission to provide you with exceptional heart care, we have created designated Provider Care Teams.  These Care Teams include your primary Cardiologist (physician) and Advanced Practice Providers (APPs -  Physician Assistants and Nurse Practitioners) who all work together to provide you with the care you need, when you need it.  We recommend signing up for the patient portal called "MyChart".  Sign up information is provided on this After Visit Summary.  MyChart is used to connect with patients for Virtual Visits (Telemedicine).  Patients are able to view lab/test results, encounter notes, upcoming appointments, etc.  Non-urgent messages can be sent to your provider as well.   To learn more about what you can do with MyChart, go to [email or web address].    Your next appointment:   12 month(s)  The format for your next appointment:   In Person  Provider:   Sanda Klein, MD {    Important Information About [employer]

## 2022-04-13 NOTE — Progress Notes (Signed)
Cardiology OfficeNote    Date:  04/14/2022   ID:  Tony [name], DOB 1941/01/08, MRN [id number]  PCP:  [name], Tony G, MD  Cardiologist:   Sanda Klein, MD    Chief Complaint  Patient presents with   Pacemaker Check    History of Present Illness:  Tony [name] is a 81 y.o. 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 He received a dual-chamber permanent pacemaker for sinus bradycardia with chronotropic incompetence in October 2019 [employer]).  In April he was hospitalized with COVID-19 infection and has required oxygen ever since.  He is using it less and less, now mostly at night and with walking.  He is down to 2 L/min.  His weight is actually now down by about 10 pounds from his hospitalization. He does not have orthopnea, PND or lower extremity edema.  He denies chest pain, palpitations, dizziness or syncope.  He has not had any focal neurological events.  He denies any bleeding problems.  Pacemaker interrogation shows normal device function.  Presenting rhythm is atrial paced ventricular sensed.  He has 82% atrial pacing with good heart rate histogram distribution and never requires ventricular pacing.  He has an estimated 10 years of remaining generator longevity.  The device has recorded a small handful of episodes of nonsustained ventricular tachycardia 6-15 beats in duration, all of them asymptomatic.  There has been no atrial fibrillation.  On Jan 28, 2022 he underwent follow-up CT angiography for his aortic aneurysms.  The ascending aortic aneurysm is virtually unchanged at 4.6 cm (4.5 cm last year) and the abdominal aneurysm is unchanged at 4.2 cm in diameter.  He is followed by Dr. Cyndia Bent.  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  His oncologist Dr. [name].  His most recent PSA was undetectable.  He has incidentally discovered coronary artery calcification on chest CT.  In September 2017 his nuclear vasodilator stress test was a low risk study with a small/mild area of ischemia in the apex. EF 59%.   He is now on second line chemotherapy for prostate cancer refractory to antiandrogen therapy, with excellent response to Zytiga.   Past Medical History:  Diagnosis Date   COPD (chronic obstructive pulmonary disease) ([place])    Gastric outlet obstruction 02/03/2018   [name] 02/03/2018   GERD (gastroesophageal reflux disease)    History of hiatal hernia    small/notes 02/03/2018   Hypertension    no meds   Presence of permanent cardiac pacemaker    Prostate cancer ([place]) 02/08/2014   Gleason 4+5=9, PSA 15.65   Radiation    Sinus problem    SSS (sick sinus syndrome) ([place])     Past Surgical History:  Procedure Laterality Date   APPENDECTOMY  2019   hx hematoma s/p appendectomy   ESOPHAGOGASTRODUODENOSCOPY (EGD) WITH PROPOFOL N/A 02/03/2018   Procedure: ESOPHAGOGASTRODUODENOSCOPY (EGD) WITH PROPOFOL;  Surgeon: Clarene Essex, MD;  Location: [facility];  Service: Endoscopy;  Laterality: N/A;   FRACTURE SURGERY     INSERT / REPLACE / REMOVE PACEMAKER  06/14/2018   LAPAROSCOPIC APPENDECTOMY N/A 01/21/2018   Procedure: APPENDECTOMY LAPAROSCOPIC;  Surgeon: Kinsinger, Arta Bruce, MD;  Location: [facility];  Service: General;  Laterality: N/A;   PACEMAKER IMPLANT N/A 06/14/2018   Procedure: PACEMAKER IMPLANT - Dual Chamber;  Surgeon: Jannette Cotham,  Dani Gobble, MD;  Location: [facility];  Service: Cardiovascular;  Laterality: N/A;   PROSTATE BIOPSY  02/08/14   gleason 4+5=9, 12/12 cores positive, 54 gm   RADIOACTIVE SEED IMPLANT N/A 06/29/2014   Procedure: RADIOACTIVE SEED IMPLANT;  Surgeon: Bernestine Amass, MD;  Location: [facility];  Service: Urology;  Laterality: N/A;   WRIST FRACTURE SURGERY  Right 1980s    Current Medications: Outpatient Medications Prior to Visit  Medication Sig Dispense Refill   abiraterone acetate (ZYTIGA) 250 MG tablet TAKE 4 TABLETS (1,000 MG TOTAL) BY MOUTH DAILY. TAKE ON AN EMPTY STOMACH 1 HOUR BEFORE OR 2 HOURS AFTER A MEAL 120 tablet 2   acetaminophen (TYLENOL) 500 MG tablet Take 1,000 mg by mouth every 6 (six) hours as needed for headache or moderate pain (pain).     albuterol (VENTOLIN HFA) 108 (90 Base) MCG/ACT inhaler TAKE 2 PUFFS BY MOUTH EVERY 6 HOURS AS NEEDED FOR WHEEZE OR SHORTNESS OF BREATH (Patient taking differently: Inhale 2 puffs into the lungs every 6 (six) hours as needed for wheezing or shortness of breath.) 18 each 12   Ascorbic Acid (VITAMIN C PO) Take 1 tablet by mouth daily.     aspirin EC 81 MG tablet Take 81 mg by mouth daily.     atorvastatin (LIPITOR) 20 MG tablet Take 1 tablet (20 mg total) by mouth daily. SCHEDULE OFFICE VISIT FOR FUTURE REFILLS 90 tablet 0   benzonatate (TESSALON) 100 MG capsule Take 1 capsule (100 mg total) by mouth daily as needed. 30 capsule 1   budesonide-formoterol (SYMBICORT) 160-4.5 MCG/ACT inhaler Inhale 2 puffs then rinse mouth, twice daily (Patient taking differently: Inhale 2 puffs into the lungs 2 (two) times daily. Inhale 2 puffs then rinse mouth, twice daily) 1 each 12   citalopram (CELEXA) 10 MG tablet TAKE 1 TABLET BY MOUTH EVERY DAY (Patient taking differently: Take 10 mg by mouth daily.) 90 tablet 3   diclofenac Sodium (VOLTAREN) 1 % GEL AAPLY 4 GRAMS TOPICALLY 4 TIMES DAILY 400 g 2   docusate sodium (COLACE) 100 MG capsule Take 100 mg by mouth daily with supper.     fluticasone (FLONASE) 50 MCG/ACT nasal spray PLACE 1 SPRAY INTO BOTH NOSTRILS DAILY AS NEEDED FOR ALLERGIES OR RHINITIS (AT BEDTIME). (Patient taking differently: Place 1 spray into both nostrils at bedtime as needed for allergies or rhinitis.) 48 mL 1   furosemide (LASIX) 20 MG tablet TAKE 1 TABLET BY MOUITH DAILY FOR 5 DAYS , THEN  DAILY AS NEEDED FOR EDEMA 90 tablet 1   LORazepam (ATIVAN) 1 MG tablet TAKE 0.5-1 TABLETS (0.5-1 MG TOTAL) BY MOUTH 2 (TWO) TIMES DAILY AS NEEDED FOR ANXIETY. 60 tablet 3   metoprolol tartrate (LOPRESSOR) 25 MG tablet TAKE 1 TABLET BY MOUTH TWICE A DAY (Patient taking differently: Take 25 mg by mouth 2 (two) times daily.) 180 tablet 2   Multiple Vitamin (MULTIVITAMIN WITH MINERALS) TABS tablet Take 0.5 tablets by mouth daily.     omeprazole (PRILOSEC) 20 MG capsule TAKE 1 CAPSULE (20 MG TOTAL) BY MOUTH DAILY BEFORE BREAKFAST. (Patient taking differently: Take 20 mg by mouth daily.) 90 capsule 3   predniSONE (DELTASONE) 5 MG tablet Take 1 tablet (5 mg total) by mouth daily with breakfast. 90 tablet 3   Tetrahydrozoline HCl (VISINE OP) Place 1 drop into both eyes daily as needed (red eyes).     amLODipine (NORVASC) 2.5 MG tablet Take 2.5 mg by mouth daily.  No facility-administered medications prior to visit.     Allergies:   Penicillins   Social History   Socioeconomic History   Marital status: Married    Spouse name: Not on file   Number of children: 3   Years of education: Not on file   Highest education level: Not on file  Occupational History   Occupation: Freight forwarder    Employer: [employer]    Comment: retired   Occupation: Freight forwarder    Comment: [employer]  Tobacco Use   Smoking status: Former    Types: Cigars    Quit date: 09/14/2012    Years since quitting: 9.5   Smokeless tobacco: Never   Tobacco comments:    little cigars; the small ones"  Vaping Use   Vaping Use: Never used  Substance and Sexual Activity   Alcohol use: Not Currently    Alcohol/week: 14.0 standard drinks of alcohol    Types: 14 Cans of beer per week   Drug use: No   Sexual activity: Not Currently  Other Topics Concern   Not on file  Social History Narrative   Lives with wife and 2 dogs in one level home; daughter and her family co-habitate.   Has three daughters, all in [place], supportive.  Five grandchildren.   Wants to return to doing silver sneakers once gym re-opens.         Social Determinants of Health   Financial Resource Strain: Low Risk  (07/22/2021)   Overall Financial Resource Strain (CARDIA)    Difficulty of Paying Living Expenses: Not hard at all  Food Insecurity: No Food Insecurity (07/22/2021)   Hunger Vital Sign    Worried About Running Out of Food in the Last Year: Never true    Ran Out of Food in the Last Year: Never true  Transportation Needs: No Transportation Needs (07/22/2021)   PRAPARE - Hydrologist (Medical): No    Lack of Transportation (Non-Medical): No  Physical Activity: Insufficiently Active (07/22/2021)   Exercise Vital Sign    Days of Exercise per Week: 5 days    Minutes of Exercise per Session: 20 min  Stress: No Stress Concern Present (07/22/2021)   [place]    Feeling of Stress : Not at all  Social Connections: Moderately Integrated (07/22/2021)   Social Connection and Isolation Panel [NHANES]    Frequency of Communication with Friends and Family: More than three times a week    Frequency of Social Gatherings with Friends and Family: More than three times a week    Attends Religious Services: More than 4 times per year    Active Member of [employer] or Organizations: No    Attends Music therapist: Not on file    Marital Status: Married     Family History:  The patient's family history includes Alzheimer's disease in his father and mother; Arthritis in his sister; Cancer in his brother and father.   ROS:   Please see the history of present illness.    ROS All other systems are reviewed and are negative.  PHYSICAL EXAM:   VS:  BP 104/76 (BP Location: Left Arm, Patient Position: Sitting, Cuff Size: Large)   Pulse 70   Ht '5\' 8"'$  (1.727 m)   Wt 220 lb (99.8 kg)   SpO2 91%   BMI 33.45 kg/m     Recheck BP 90/60 mm Hg  General:  Alert, oriented x3, no  distress, moderately obese.  Healthy left subclavian pacemaker site. Head: no evidence of trauma, PERRL, EOMI, no exophtalmos or lid lag, no myxedema, no xanthelasma; normal ears, nose and oropharynx Neck: normal jugular venous pulsations and no hepatojugular reflux; brisk carotid pulses without delay and no carotid bruits Chest: clear to auscultation, no signs of consolidation by percussion or palpation, normal fremitus, symmetrical and full respiratory excursions Cardiovascular: normal position and quality of the apical impulse, regular rhythm, normal first and second heart sounds, no murmurs, rubs or gallops Abdomen: no tenderness or distention, no masses by palpation, no abnormal pulsatility or arterial bruits, normal bowel sounds, no hepatosplenomegaly Extremities: no clubbing, cyanosis or edema; 2+ radial, ulnar and brachial pulses bilaterally; 2+ right femoral, posterior tibial and dorsalis pedis pulses; 2+ left femoral, posterior tibial and dorsalis pedis pulses; no subclavian or femoral bruits Neurological: grossly nonfocal Psych: Normal mood and affect   Wt Readings from Last 3 Encounters:  04/13/22 220 lb (99.8 kg)  02/16/22 223 lb 3.2 oz (101.2 kg)  01/28/22 228 lb (103.4 kg)      Studies/Labs Reviewed:   ECHO 07/14/2021:    1. Left ventricular ejection fraction, by estimation, is 60 to 65%. The  left ventricle has normal function. The left ventricle has no regional  wall motion abnormalities. Left ventricular diastolic parameters were  normal.   2. Right ventricular systolic function is normal. The right ventricular  size is normal.   3. The mitral valve is normal in structure. No evidence of mitral valve  regurgitation. No evidence of mitral stenosis.   4. The aortic valve is calcified. Aortic valve regurgitation is trivial.  Mild aortic valve sclerosis is present, with no evidence of aortic valve  stenosis.   5. Aortic dilatation noted. There is  mild dilatation of the aortic root,  measuring 40 mm. There is mild dilatation of the ascending aorta,  measuring 43 mm.   6. The inferior vena cava is normal in size with greater than 50%  respiratory variability, suggesting right atrial pressure of 3 mmHg.   EKG:  EKG is ordered today.  It shows atrial paced, ventricular sensed rhythm with bifascicular block (RBBB plus LAFB, chronic) prolonged QTc 516 ms partially corrects for the width of the QRS complex.  Recent Labs: 05/15/2021: TSH 1.840 06/23/2021: Pro B Natriuretic peptide (BNP) 158.0 07/13/2021: B Natriuretic Peptide 266.5 12/31/2021: Magnesium 1.9 01/16/2022: ALT 17; BUN 15; Creatinine 0.90; Hemoglobin 13.1; Platelet Count 170; Potassium 4.4; Sodium 136   Lipid Panel    Component Value Date/Time   CHOL 191 12/30/2021 0117   TRIG 76 12/30/2021 0117   HDL 55 12/30/2021 0117   CHOLHDL 3.5 12/30/2021 0117   VLDL 15 12/30/2021 0117   LDLCALC 121 (H) 12/30/2021 0117   LDLCALC 71 05/21/2020 1007   ASSESSMENT:    1. SSS (sick sinus syndrome) ([place])   2. Pacemaker   3. Trifascicular block   4. Coronary artery disease involving native coronary artery of native heart without angina pectoris   5. Aneurysm of ascending aorta without rupture ([place])   6. Infrarenal abdominal aortic aneurysm (AAA) without rupture ([place])   7. Hypercholesterolemia   8. Essential hypertension   9. Prostate cancer ([place])      PLAN:  In order of problems listed above:  Sinus bradycardia/SSS: Primary limited by problems with hypoxemia from lung disease, no evidence of chronotropic incompetence based on device download. PPM: Continue remote downloads every 3 months. RBBB+LAFB+long PR: Thankfully he does not require ventricular  pacing, even when taking beta-blockers. CAD: Denies angina pectoris. Low risk stress test 2017 but with a small area of ischemia.   He takes metoprolol and amlodipine which may be functioning as antianginal medications in addition to  antihypertensive effects.  Invasive evaluation does not appear to be indicated.   Ascending aortic aneurysm/AAA: Stable sizes by CT angiography, most recently May 2023 HLP: While on treatment with statin his LDL cholesterol has been right around 70, not sure why in April of this year LDL was higher at 121.  He is still on atorvastatin 20 mg daily.  There may have been an inadvertent interruption in statin therapy. HTN: Borderline low, without symptoms of hypotension.  On amlodipine and metoprolol for antianginal benefit, but may not need these anymore. Will stop the amlodipine. Prostate Ca: Remarkable response to Zytiga with undetectable PSA for 6 years now.   Medication Adjustments/Labs and Tests Ordered: Current medicines are reviewed at length with the patient today.  Concerns regarding medicines are outlined above.  Medication changes, Labs and Tests ordered today are listed in the Patient Instructions below.  Patient Instructions  Medication Instructions:  STOP the Amlodipine  *If you need a refill on your cardiac medications before your next appointment, please call your pharmacy*   Lab Work: None ordered If you have labs (blood work) drawn today and your tests are completely normal, you will receive your results only by: [place] (if you have MyChart) OR A paper copy in the mail If you have any lab test that is abnormal or we need to change your treatment, we will call you to review the results.   Testing/Procedures: None ordered   Follow-Up: At [facility], you and your health needs are our priority.  As part of our continuing mission to provide you with exceptional heart care, we have created designated Provider Care Teams.  These Care Teams include your primary Cardiologist (physician) and Advanced Practice Providers (APPs -  Physician Assistants and Nurse Practitioners) who all work together to provide you with the care you need, when you need it.  We recommend  signing up for the patient portal called "MyChart".  Sign up information is provided on this After Visit Summary.  MyChart is used to connect with patients for Virtual Visits (Telemedicine).  Patients are able to view lab/test results, encounter notes, upcoming appointments, etc.  Non-urgent messages can be sent to your provider as well.   To learn more about what you can do with MyChart, go to [email or web address].    Your next appointment:   12 month(s)  The format for your next appointment:   In Person  Provider:   Sanda Klein, MD {    Important Information About Sugar         Signed, Sanda Klein, MD  04/14/2022 3:37 PM    [address], [place], [place]  [address] Phone: [phone]; Fax: [phone]

## 2022-04-14 ENCOUNTER — Encounter: Payer: Self-pay | Admitting: Cardiovascular Disease

## 2022-04-15 ENCOUNTER — Ambulatory Visit: Payer: Medicare Other | Admitting: Family Medicine

## 2022-04-16 ENCOUNTER — Telehealth: Payer: Self-pay | Admitting: Pharmacist

## 2022-04-16 NOTE — Chronic Care Management (AMB) (Signed)
    Chronic Care Management Pharmacy Assistant   Name: Tony [name] [facility]  MRN: [id number] DOB: Dec 25, 1940  04/20/2022 APPOINTMENT REMINDER  Called Tony [name], No answer, left message of appointment on 04/20/2022 at 8:30 via telephone visit with Tony [name], Pharm D. Notified to have all medications, supplements, blood pressure and/or blood sugar logs available during appointment and to return call if need to reschedule.  Care Gaps: AWV - scheduled 07/23/2022 Last BP - 104/76 on 04/13/2022 Flu - due Covid booster - postponed Shingrix - postponed  Star Rating Drug: Atorvastatin 20 mg - last filled 01/25/2022 90 DS at [facility]  Any gaps in medications fill history? No  Tony [name] [facility]  Catering manager [phone]

## 2022-04-17 ENCOUNTER — Other Ambulatory Visit: Payer: Self-pay

## 2022-04-17 ENCOUNTER — Inpatient Hospital Stay: Payer: Medicare Other | Attending: Oncology

## 2022-04-17 ENCOUNTER — Inpatient Hospital Stay: Payer: Medicare Other | Admitting: Oncology

## 2022-04-17 ENCOUNTER — Inpatient Hospital Stay: Payer: Medicare Other

## 2022-04-17 ENCOUNTER — Ambulatory Visit: Payer: Medicare Other | Admitting: Family Medicine

## 2022-04-17 VITALS — BP 104/78 | HR 86 | Temp 97.7°F | Resp 18 | Wt 222.2 lb

## 2022-04-17 DIAGNOSIS — E876 Hypokalemia: Secondary | ICD-10-CM | POA: Diagnosis not present

## 2022-04-17 DIAGNOSIS — I10 Essential (primary) hypertension: Secondary | ICD-10-CM | POA: Diagnosis not present

## 2022-04-17 DIAGNOSIS — C61 Malignant neoplasm of prostate: Secondary | ICD-10-CM | POA: Insufficient documentation

## 2022-04-17 DIAGNOSIS — E669 Obesity, unspecified: Secondary | ICD-10-CM

## 2022-04-17 DIAGNOSIS — Z5111 Encounter for antineoplastic chemotherapy: Secondary | ICD-10-CM | POA: Diagnosis not present

## 2022-04-17 LAB — CMP (CANCER CENTER ONLY)
ALT: 15 U/L (ref 0–44)
AST: 18 U/L (ref 15–41)
Albumin: 4.1 g/dL (ref 3.5–5.0)
Alkaline Phosphatase: 84 U/L (ref 38–126)
Anion gap: 7 (ref 5–15)
BUN: 16 mg/dL (ref 8–23)
CO2: 27 mmol/L (ref 22–32)
Calcium: 9 mg/dL (ref 8.9–10.3)
Chloride: 103 mmol/L (ref 98–111)
Creatinine: 1.09 mg/dL (ref 0.61–1.24)
GFR, Estimated: >60 mL/min (ref >60)
Glucose, Bld: 133 mg/dL — ABNORMAL HIGH (ref 70–99)
Potassium: 4.7 mmol/L (ref 3.5–5.1)
Sodium: 137 mmol/L (ref 135–145)
Total Bilirubin: 0.9 mg/dL (ref 0.3–1.2)
Total Protein: 6.7 g/dL (ref 6.5–8.1)

## 2022-04-17 LAB — CBC WITH DIFFERENTIAL (CANCER CENTER ONLY)
Abs Immature Granulocytes: 0.06 10*3/uL (ref 0.00–0.07)
Basophils Absolute: 0.1 10*3/uL (ref 0.0–0.1)
Basophils Relative: 1 %
Eosinophils Absolute: 0.1 10*3/uL (ref 0.0–0.5)
Eosinophils Relative: 1 %
HCT: 41.4 % (ref 39.0–52.0)
Hemoglobin: 13.9 g/dL (ref 13.0–17.0)
Immature Granulocytes: 1 %
Lymphocytes Relative: 11 %
Lymphs Abs: 1 10*3/uL (ref 0.7–4.0)
MCH: 31.5 pg (ref 26.0–34.0)
MCHC: 33.6 g/dL (ref 30.0–36.0)
MCV: 93.9 fL (ref 80.0–100.0)
Monocytes Absolute: 0.7 10*3/uL (ref 0.1–1.0)
Monocytes Relative: 8 %
Neutro Abs: 7.2 10*3/uL (ref 1.7–7.7)
Neutrophils Relative %: 78 %
Platelet Count: 204 10*3/uL (ref 150–400)
RBC: 4.41 MIL/uL (ref 4.22–5.81)
RDW: 13.8 % (ref 11.5–15.5)
WBC Count: 9.1 10*3/uL (ref 4.0–10.5)
nRBC: 0 % (ref 0.0–0.2)

## 2022-04-17 MED ORDER — LEUPROLIDE ACETATE (3 MONTH) 22.5 MG ~~LOC~~ KIT
22.5000 mg | PACK | Freq: Once | SUBCUTANEOUS | Status: AC
  Administered 2022-04-17: 22.5 mg via SUBCUTANEOUS
  Filled 2022-04-17: qty 22.5

## 2022-04-17 NOTE — Progress Notes (Signed)
Hematology and Oncology Follow Up Visit  Tony [name] [id number] 1941-06-05 81 y.o. 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  He presented with Gleason score 4+5 = 9 and a PSA 15.7 and localized disease in 2015.   Prior Therapy:  He is status post radiation therapy for external beam radiation completed in September 2015 for a total of 45 gray. He also status post seed implant brachytherapy boost completed in October 2015.   He received total of 1 year of androgen deprivation 22,015 and 2016. His PSA nadir was to 0.47 in November 2016. His PSA was 4.08 in May 2017.  Current therapy:   Eligard 22.5 mg every 3 months.  He will receive Eligard today and repeated in 3 months.  Zytiga 1000 mg with prednisone started in August 2017.   Interim History: Mr. Ringler returns today for a follow-up.  Since last visit, he reports no major changes in his health.  He continues to tolerate Zytiga without any major concerns.  He denies any nausea, vomiting or abdominal pain.  He denies any bone pain or pathological fractures.  He denies any falls or syncope.  He denies any worsening edema.        Medications: Updated on review. Current Outpatient Medications  Medication Sig Dispense Refill   abiraterone acetate (ZYTIGA) 250 MG tablet TAKE 4 TABLETS (1,000 MG TOTAL) BY MOUTH DAILY. TAKE ON AN EMPTY STOMACH 1 HOUR BEFORE OR 2 HOURS AFTER A MEAL 120 tablet 2   acetaminophen (TYLENOL) 500 MG tablet Take 1,000 mg by mouth every 6 (six) hours as needed for headache or moderate pain (pain).     albuterol (VENTOLIN HFA) 108 (90 Base) MCG/ACT inhaler TAKE 2 PUFFS BY MOUTH EVERY 6 HOURS AS NEEDED FOR WHEEZE OR SHORTNESS OF BREATH (Patient taking differently: Inhale 2 puffs into the lungs every 6 (six) hours as needed for wheezing or shortness of breath.) 18  each 12   Ascorbic Acid (VITAMIN C PO) Take 1 tablet by mouth daily.     aspirin EC 81 MG tablet Take 81 mg by mouth daily.     atorvastatin (LIPITOR) 20 MG tablet Take 1 tablet (20 mg total) by mouth daily. SCHEDULE OFFICE VISIT FOR FUTURE REFILLS 90 tablet 0   benzonatate (TESSALON) 100 MG capsule Take 1 capsule (100 mg total) by mouth daily as needed. 30 capsule 1   budesonide-formoterol (SYMBICORT) 160-4.5 MCG/ACT inhaler Inhale 2 puffs then rinse mouth, twice daily (Patient taking differently: Inhale 2 puffs into the lungs 2 (two) times daily. Inhale 2 puffs then rinse mouth, twice daily) 1 each 12   citalopram (CELEXA) 10 MG tablet TAKE 1 TABLET BY MOUTH EVERY DAY (Patient taking differently: Take 10 mg by mouth daily.) 90 tablet 3   diclofenac Sodium (VOLTAREN) 1 % GEL AAPLY 4 GRAMS TOPICALLY 4 TIMES DAILY 400 g 2   docusate sodium (COLACE) 100 MG capsule Take 100 mg by mouth daily with supper.     fluticasone (FLONASE) 50 MCG/ACT nasal spray PLACE 1 SPRAY INTO BOTH NOSTRILS DAILY AS NEEDED FOR ALLERGIES OR RHINITIS (AT BEDTIME). (Patient taking differently: Place 1 spray into both nostrils at bedtime as needed for allergies or rhinitis.) 48 mL 1   furosemide (LASIX) 20 MG tablet TAKE 1 TABLET BY MOUITH DAILY FOR 5 DAYS , THEN DAILY AS NEEDED FOR EDEMA 90 tablet 1   LORazepam (  ATIVAN) 1 MG tablet TAKE 0.5-1 TABLETS (0.5-1 MG TOTAL) BY MOUTH 2 (TWO) TIMES DAILY AS NEEDED FOR ANXIETY. 60 tablet 3   metoprolol tartrate (LOPRESSOR) 25 MG tablet TAKE 1 TABLET BY MOUTH TWICE A DAY (Patient taking differently: Take 25 mg by mouth 2 (two) times daily.) 180 tablet 2   Multiple Vitamin (MULTIVITAMIN WITH MINERALS) TABS tablet Take 0.5 tablets by mouth daily.     omeprazole (PRILOSEC) 20 MG capsule TAKE 1 CAPSULE (20 MG TOTAL) BY MOUTH DAILY BEFORE BREAKFAST. (Patient taking differently: Take 20 mg by mouth daily.) 90 capsule 3   predniSONE (DELTASONE) 5 MG tablet Take 1 tablet (5 mg total) by mouth daily  with breakfast. 90 tablet 3   Tetrahydrozoline HCl (VISINE OP) Place 1 drop into both eyes daily as needed (red eyes).     No current facility-administered medications for this visit.     Allergies:  Allergies  Allergen Reactions   Penicillins Rash    Has patient had a PCN reaction causing immediate rash, facial/tongue/throat swelling, SOB or lightheadedness with hypotension: Yes Has patient had a PCN reaction causing severe rash involving mucus membranes or skin necrosis: No Has patient had a PCN reaction that required hospitalization: No Has patient had a PCN reaction occurring within the last 10 years: No If all of the above answers are "NO", then may proceed with Cephalosporin use.      Physical Exam:        Blood pressure 104/78, pulse 86, temperature 97.7 F (36.5 C), temperature source Temporal, resp. rate 18, weight 222 lb 3.2 oz (100.8 kg), SpO2 92 %.      ECOG: 1     General appearance: Alert, awake without any distress. Head: Atraumatic without abnormalities Oropharynx: Without any thrush or ulcers. Eyes: No scleral icterus. Lymph nodes: No lymphadenopathy noted in the cervical, supraclavicular, or axillary nodes Heart:regular rate and rhythm, without any murmurs or gallops.   Lung: Clear to auscultation without any rhonchi, wheezes or dullness to percussion. Abdomin: Soft, nontender without any shifting dullness or ascites. Musculoskeletal: No clubbing or cyanosis. Neurological: No motor or sensory deficits. Skin: No rashes or lesions.                  Lab Results: Lab Results  Component Value Date   WBC 7.5 01/16/2022   HGB 13.1 01/16/2022   HCT 39.6 01/16/2022   MCV 93.0 01/16/2022   PLT 170 01/16/2022     Chemistry      Component Value Date/Time   NA 136 01/16/2022 1130   NA 139 02/03/2019 0857   NA 141 08/27/2017 0915   K 4.4 01/16/2022 1130   K 4.8 08/27/2017 0915   CL 99 01/16/2022 1130   CO2 29 01/16/2022 1130    CO2 26 08/27/2017 0915   BUN 15 01/16/2022 1130   BUN 13 02/03/2019 0857   BUN 16.2 08/27/2017 0915   CREATININE 0.90 01/16/2022 1130   CREATININE 1.07 07/31/2020 1325   CREATININE 0.9 08/27/2017 0915   GLU 105 01/10/2016 0000      Component Value Date/Time   CALCIUM 9.1 01/16/2022 1130   CALCIUM 9.8 08/27/2017 0915   ALKPHOS 80 01/16/2022 1130   ALKPHOS 83 08/27/2017 0915   AST 16 01/16/2022 1130   AST 23 08/27/2017 0915   ALT 17 01/16/2022 1130   ALT 17 08/27/2017 0915   BILITOT 0.8 01/16/2022 1130   BILITOT 0.95 08/27/2017 0915  Latest Reference Range & Units 10/03/21 08:12 01/16/22 11:30  Prostate Specific Ag, Serum 0.0 - 4.0 ng/mL <0.1 <0.1      Impression and Plan:   81 year old man with:  1.  Castration-sensitive advanced prostate cancer with lymphadenopathy diagnosed in 2017.    The natural course of his disease was reviewed at this time and treatment choices were discussed.  His PSA continues to be undetectable without any evidence of relapsed disease.  Long-term complication associated with Zytiga were reviewed including hypertension, adrenal insufficiency among others were reiterated.  He is agreeable to continue at this time.  Different salvage therapy options including systemic chemotherapy will be deferred at this time.   3. Hypokalemia: Resolved with normal potassium level.  This is related to Zytiga.  4. Hypertension: His blood pressure is within normal range.  5.  Prognosis and goals of care: His disease is incurable although aggressive measures are warranted.  6.  Androgen deprivation therapy: He is currently on Eligard which will be repeated today and continued indefinitely.  Complications including weight gain, hot flashes among others were reviewed.   7. Follow-up: He will return in 3 months for a follow-up.   30  minutes were spent on this visit.  The time was dedicated to reviewing laboratory data, disease status update and  outlining future plan of care discussion.  Zola Button, MD 8/4/20239:50 AM

## 2022-04-17 NOTE — Progress Notes (Deleted)
 Chronic Care Management Pharmacy Note  04/17/2022 Name:  Tony [name] MRN:  [id number] DOB:  11/14/1940  Summary: Patients breathing has improved since increasing Advair dose   Recommendations/Changes made from today's visit: -Discussed patient assistance opportunities for inhaler but must be switched to alternative brand name LABA/ICS and patient prefers to hold off for now -Recommended home monitoring of BP   Plan: Follow up in 6 months  Subjective: Tony [name] is an 81 y.o. year old male who is a primary patient of [name], [name], MD.  The CCM team was consulted for assistance with disease management and care coordination needs.    Engaged with patient by telephone for follow up visit in response to provider referral for pharmacy case management and/or care coordination services.   Consent to Services:  The patient was given information about Chronic Care Management services, agreed to services, and gave verbal consent prior to initiation of services.  Please see initial visit note for detailed documentation.   Patient Care Team: [name], [name], MD as PCP - General (Family Medicine) Croitoru, Mihai, MD as PCP - Cardiology (Cardiology) Shadad, Firas N, MD as Consulting Physician (Oncology) , Madeline G, RPH as Pharmacist (Pharmacist)  Recent office visits: 01/14/2022 Betty [name] MD - Patient was seen for Chronic respiratory failure with hypoxia and additional issues. Referral to Home Health. Started Benzonatate 200 mg daily as needed. Follow up in 3 months.    12/16/2021 Betty [name] MD - Patient was seen for essential hypertension and additional issues. No medications changes. Follow up in 6 months.   10/27/2021 Betty [name] MD - Patient was seen for COPD mixed type and additional issues. Discontinued Amlodipine, Azithromycin, Potassium and Prednisone.  Return if symptoms worsen or fail to improve  Recent consult visits: 04/17/22 Firsa Shadad, MD  (oncology): Patient presented for prostate neoplasm follow up and Eligard infusion. Follow up in 3 months.  04/13/22 Mihai Croitoru, MD (cardiology): Patient presented for SSS follow up and pacemaker check. D/c'd amlodipine.  02/16/22 Clinton Young, MD (pulmonary): Patient presented for chronic respiratory failure with hypoxia. Follow up in 6 months.  02/10/22 Shelby Allen, MD (central Opal surgery): Patient presented for initial visit for gallbladder. Plan for surgery pending response from cardiologist.  01/28/22 Bryan Bartle, MD (cardiac & thoracic surgery): Patient presented for thoracic aortic aneurysm. Follow up in 1 year.  01/16/22 Firas Shadad MD (oncology): Patient presented for prostate neoplasm follow up and Eligard infusion.  Follow up in 3 months.   Hospital visits: Admitted to [facility] on 12/24/2021 due to Respiratory failure with hypoxia. Discharge date was 01/01/2022. New?Medications Started at Hospital Discharge:?? dexamethasone (DECADRON) metroNIDAZOLE (Flagyl) sulfamethoxazole-trimethoprim (BACTRIM DS) Medication Changes at Hospital Discharge: predniSONE (DELTASONE) Medications Discontinued at Hospital Discharge: No medications discontinued Medications that remain the same after Hospital Discharge:??  -All other medications will remain the same.    Objective:  Lab Results  Component Value Date   CREATININE 1.09 04/17/2022   BUN 16 04/17/2022   GFR 78.21 02/18/2018   GFRNONAA >60 04/17/2022   GFRAA 77 07/31/2020   NA 137 04/17/2022   K 4.7 04/17/2022   CALCIUM 9.0 04/17/2022   CO2 27 04/17/2022    Lab Results  Component Value Date/Time   HGBA1C 5.9 (H) 12/26/2021 12:47 AM   HGBA1C 6.1 06/23/2021 11:32 AM   GFR 78.21 02/18/2018 09:01 AM   GFR 93.27 01/21/2018 10:12 AM    Last diabetic Eye exam: No results found for: "HMDIABEYEEXA"    Last diabetic Foot exam: No results found for: "HMDIABFOOTEX"   Lab Results  Component Value Date    CHOL 191 12/30/2021   HDL 55 12/30/2021   LDLCALC 121 (H) 12/30/2021   TRIG 76 12/30/2021   CHOLHDL 3.5 12/30/2021       Latest Ref Rng & Units 04/17/2022   10:06 AM 01/16/2022   11:30 AM 01/01/2022   12:32 AM  Hepatic Function  Total Protein 6.5 - 8.1 g/dL 6.7  6.7  5.6   Albumin 3.5 - 5.0 g/dL 4.1  3.7  2.9   AST 15 - 41 U/L 18  16  30   ALT 0 - 44 U/L 15  17  70   Alk Phosphatase 38 - 126 U/L 84  80  70   Total Bilirubin 0.3 - 1.2 mg/dL 0.9  0.8  0.7     Lab Results  Component Value Date/Time   TSH 1.840 05/15/2021 09:27 AM   TSH 0.76 05/13/2016 11:55 AM       Latest Ref Rng & Units 04/17/2022   10:06 AM 01/16/2022   11:30 AM 01/01/2022   12:32 AM  CBC  WBC 4.0 - 10.5 K/uL 9.1  7.5  8.9   Hemoglobin 13.0 - 17.0 g/dL 13.9  13.1  12.6   Hematocrit 39.0 - 52.0 % 41.4  39.6  38.5   Platelets 150 - 400 K/uL 204  170  245     No results found for: "VD25OH"  Clinical ASCVD: Yes  The 10-year ASCVD risk score (Arnett DK, et al., 2019) is: 25.2%   Values used to calculate the score:     Age: 81 years     Sex: Male     Is Non-Hispanic African American: No     Diabetic: No     Tobacco smoker: No     Systolic Blood Pressure: 104 mmHg     Is BP treated: Yes     HDL Cholesterol: 55 mg/dL     Total Cholesterol: 191 mg/dL       01/14/2022    2:09 PM 10/27/2021    9:37 AM 07/22/2021    2:43 PM  Depression screen PHQ 2/9  Decreased Interest 0 0 0  Down, Depressed, Hopeless 0 0 0  PHQ - 2 Score 0 0 0  Altered sleeping  0   Tired, decreased energy  2   Change in appetite  0   Feeling bad or failure about yourself   0   Trouble concentrating  0   Moving slowly or fidgety/restless  0   Suicidal thoughts  0   PHQ-9 Score  2   Difficult doing work/chores  Not difficult at all       Social History   Tobacco Use  Smoking Status Former   Types: Cigars   Quit date: 09/14/2012   Years since quitting: 9.5  Smokeless Tobacco Never  Tobacco Comments   little cigars; the small  ones"   BP Readings from Last 3 Encounters:  04/17/22 104/78  04/13/22 104/76  02/16/22 92/68   Pulse Readings from Last 3 Encounters:  04/17/22 86  04/13/22 70  02/16/22 84   Wt Readings from Last 3 Encounters:  04/17/22 222 lb 3.2 oz (100.8 kg)  04/13/22 220 lb (99.8 kg)  02/16/22 223 lb 3.2 oz (101.2 kg)    Assessment/Interventions: Review of patient past medical history, allergies, medications, health status, including review of consultants reports, laboratory and other test data, was performed   as part of comprehensive evaluation and provision of chronic care management services.   SDOH:  (Social Determinants of Health) assessments and interventions performed: Yes ***    CCM Care Plan  Allergies  Allergen Reactions   Penicillins Rash    Has patient had a PCN reaction causing immediate rash, facial/tongue/throat swelling, SOB or lightheadedness with hypotension: Yes Has patient had a PCN reaction causing severe rash involving mucus membranes or skin necrosis: No Has patient had a PCN reaction that required hospitalization: No Has patient had a PCN reaction occurring within the last 10 years: No If all of the above answers are "NO", then may proceed with Cephalosporin use.    Medications Reviewed Today     Reviewed by Sanda Klein, MD (Physician) on 04/14/22 at [place] List Status: <None>   Medication Order Taking? Sig Documenting Provider Last Dose Status Informant  abiraterone acetate (ZYTIGA) 250 MG tablet [id number] Yes TAKE 4 TABLETS (1,000 MG TOTAL) BY MOUTH DAILY. TAKE ON AN EMPTY STOMACH 1 HOUR BEFORE OR 2 HOURS AFTER A MEAL Shadad, Mathis Dad, MD Taking Active   acetaminophen (TYLENOL) 500 MG tablet [id number] Yes Take 1,000 mg by mouth every 6 (six) hours as needed for headache or moderate pain (pain). [provider] Taking Active Spouse/Significant Other  albuterol (VENTOLIN HFA) 108 (90 Base) MCG/ACT inhaler [id number] Yes TAKE 2 PUFFS BY MOUTH EVERY  6 HOURS AS NEEDED FOR WHEEZE OR SHORTNESS OF BREATH  Patient taking differently: Inhale 2 puffs into the lungs every 6 (six) hours as needed for wheezing or shortness of breath.   Deneise Lever, MD Taking Active Spouse/Significant Other  Ascorbic Acid (VITAMIN C PO) [id number] Yes Take 1 tablet by mouth daily. [provider] Taking Active Spouse/Significant Other  aspirin EC 81 MG tablet [id number] Yes Take 81 mg by mouth daily. [provider] Taking Active Spouse/Significant Other  atorvastatin (LIPITOR) 20 MG tablet [id number] Yes Take 1 tablet (20 mg total) by mouth daily. SCHEDULE OFFICE VISIT FOR FUTURE REFILLS Croitoru, Mihai, MD Taking Active   benzonatate (TESSALON) 100 MG capsule [id number] Yes Take 1 capsule (100 mg total) by mouth daily as needed. [name], [name], MD Taking Active   budesonide-formoterol [facility]) 160-4.5 MCG/ACT inhaler [id number] Yes Inhale 2 puffs then rinse mouth, twice daily  Patient taking differently: Inhale 2 puffs into the lungs 2 (two) times daily. Inhale 2 puffs then rinse mouth, twice daily   Young, Clinton D, MD Taking Active Spouse/Significant Other  citalopram (CELEXA) 10 MG tablet [id number] Yes TAKE 1 TABLET BY MOUTH EVERY DAY  Patient taking differently: Take 10 mg by mouth daily.   [name], [name], MD Taking Active Spouse/Significant Other  diclofenac Sodium (VOLTAREN) 1 % GEL [id number] Yes AAPLY 4 GRAMS TOPICALLY 4 TIMES DAILY [name], [name], MD Taking Active Spouse/Significant Other  docusate sodium (COLACE) 100 MG capsule [id number] Yes Take 100 mg by mouth daily with supper. [provider] Taking Active Spouse/Significant Other           Med Note Georgena Spurling Jul 13, 2021  4:38 PM)    fluticasone (FLONASE) 50 MCG/ACT nasal spray [id number] Yes PLACE 1 SPRAY INTO BOTH NOSTRILS DAILY AS NEEDED FOR ALLERGIES OR RHINITIS (AT BEDTIME).  Patient taking differently: Place 1 spray into both nostrils at bedtime as  needed for allergies or rhinitis.   [name], [name], MD Taking Active Spouse/Significant Other  furosemide (LASIX) 20 MG tablet [id number] Yes TAKE 1 TABLET BY  MOUITH DAILY FOR 5 DAYS , THEN DAILY AS NEEDED FOR EDEMA [name], [name], MD Taking Active   LORazepam (ATIVAN) 1 MG tablet [id number] Yes TAKE 0.5-1 TABLETS (0.5-1 MG TOTAL) BY MOUTH 2 (TWO) TIMES DAILY AS NEEDED FOR ANXIETY. [name], [name], MD Taking Active   metoprolol tartrate (LOPRESSOR) 25 MG tablet [id number] Yes TAKE 1 TABLET BY MOUTH TWICE A DAY  Patient taking differently: Take 25 mg by mouth 2 (two) times daily.   Croitoru, Mihai, MD Taking Active Spouse/Significant Other  Multiple Vitamin (MULTIVITAMIN WITH MINERALS) TABS tablet [id number] Yes Take 0.5 tablets by mouth daily. [provider] Taking Active Spouse/Significant Other  omeprazole (PRILOSEC) 20 MG capsule [id number] Yes TAKE 1 CAPSULE (20 MG TOTAL) BY MOUTH DAILY BEFORE BREAKFAST.  Patient taking differently: Take 20 mg by mouth daily.   [name], [name], MD Taking Active Spouse/Significant Other  predniSONE (DELTASONE) 5 MG tablet [id number] Yes Take 1 tablet (5 mg total) by mouth daily with breakfast. Elgergawy, Dawood S, MD Taking Active   Tetrahydrozoline HCl (VISINE OP) [id number] Yes Place 1 drop into both eyes daily as needed (red eyes). [provider] Taking Active Spouse/Significant Other            Patient Active Problem List   Diagnosis Date Noted   Preoperative clearance 02/16/2022   Morbid obesity (HCC) BMI 35.2 and HTN,OSA,HLD,COPD,GERD 01/14/2022   Abdominal aortic aneurysm (AAA) 3.0 cm to 5.5 cm in diameter in male (HCC) 12/26/2021   Respiratory failure with hypoxia (HCC) 12/24/2021   COVID 12/24/2021   Severe sepsis (HCC)    Transaminitis    Chronic respiratory failure with hypoxia (HCC) 12/16/2021   Dysphagia 08/18/2021   Dyspnea 07/13/2021   Atherosclerosis of aorta (HCC) 06/20/2021   OSA (obstructive sleep apnea)  07/25/2020   Senile ecchymosis 05/21/2020   Obesity with body mass index (BMI) of 30.0 to 39.9 11/14/2019   Prediabetes 11/14/2019   Varicose veins of both lower extremities 05/17/2019   Bilateral lower extremity edema 03/13/2019   Hypokalemia 03/13/2019   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   SSS (sick sinus syndrome) (HCC) 06/14/2018   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   Coronary artery disease involving native coronary artery of native heart without angina pectoris 06/08/2018   Sick sinus syndrome (HCC) 06/08/2018   Aneurysm of thoracic aorta (HCC) 02/03/2018   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   COPD mixed type (HCC) 02/03/2018   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   Acute appendicitis 01/21/2018   Perforated appendicitis 01/21/2018   Chronic fatigue 06/01/2017   Essential hypertension, benign 07/09/2016   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HCC) 07/06/2016   Coronary artery calcification seen on CAT scan 05/15/2016   Anxiety disorder 04/08/2016   Insomnia 04/08/2016   Atherosclerosis of coronary artery without angina pectoris 04/08/2016   Tobacco use disorder 04/08/2016   Hypercholesterolemia 04/08/2016   Malignant neoplasm of prostate (HCC) 02/23/2014    Immunization History  Administered Date(s) Administered   Fluad Quad(high Dose 65+) 05/17/2019, 05/21/2020, 06/20/2021   Influenza, High Dose Seasonal PF 05/05/2016, 06/01/2017, 06/22/2018   PFIZER(Purple Top)SARS-COV-2 Vaccination 10/05/2019, 10/26/2019, 06/18/2020   Pneumococcal Conjugate-13 12/27/2013   Pneumococcal Polysaccharide-23 07/31/2020   Td 02/13/2019   Tdap 01/03/2015   Patient reports he has had some constipation but is drinking lots of water and taking fiber tablets to help move things along.  -escalate to triple therapy inhaler? PFTs? Switch to lama/laba?  -how often is he using albuterol?  -checking BP?  BP Readings from Last 3 Encounters:    04/17/22 104/78  04/13/22 104/76  02/16/22 92/68      Conditions to be addressed/monitored:  Hypertension, Hyperlipidemia, Coronary Artery Disease, Anxiety and prostate cancer, insomnia, and prediabetes  Conditions addressed this visit: ***  There are no care plans that you recently modified to display for this patient.     Compliance/Adherence/Medication fill history: Care Gaps: Shingrix, COVID booster, influenza vaccine Last BP - 104/76 on 04/13/2022  Star-Rating Drugs: Atorvastatin 20 mg - last filled 01/25/2022 90 DS at [facility]  Medication Assistance: Application for Advair HFA and Ventolin HFA  medication assistance program. in process.  Anticipated assistance start date unknown where patient is in application process.  See plan of care for additional detail.  Patient's preferred pharmacy is:  [id number] [address]Phone: [phone]: [phone] [place]Phone: [phone]: [phone] [place]Phone: [phone]: [phone] [facility] - [id number]: [phone]: [phone]  Uses pill box? Yes Pt endorses 100% compliance  We discussed: Current pharmacy is preferred with insurance plan and patient is satisfied with pharmacy services Patient decided to: Continue current medication management strategy  Care Plan and Follow Up Patient Decision:  Patient agrees to Care Plan and Follow-up.  Plan: Telephone follow up appointment with care management team member scheduled for:  6 months  MJeni Salles PharmD BTiogaPharmacist LDiagonal[phone]

## 2022-04-19 LAB — PROSTATE-SPECIFIC AG, SERUM (LABCORP): Prostate Specific Ag, Serum: <0.1 ng/mL (ref 0.0–4.0)

## 2022-04-20 ENCOUNTER — Telehealth: Payer: Self-pay

## 2022-04-20 ENCOUNTER — Telehealth: Payer: Medicare Other

## 2022-04-20 NOTE — Telephone Encounter (Signed)
-----   Message from Tony Portela, MD sent at 04/20/2022  8:27 AM EDT ----- Please let him know his PSA is still low

## 2022-04-20 NOTE — Telephone Encounter (Signed)
Contacted patient and made him aware that PSA remains low. No questions or concerns.

## 2022-04-21 NOTE — Progress Notes (Deleted)
HPI: [name] is a 81 y.o. male, who is here today for chronic disease management.  Last seen on 01/14/22  *** Review of Systems Rest of ROS see pertinent positives and negatives in HPI.  Current Outpatient Medications on File Prior to Visit  Medication Sig Dispense Refill   abiraterone acetate (ZYTIGA) 250 MG tablet TAKE 4 TABLETS (1,000 MG TOTAL) BY MOUTH DAILY. TAKE ON AN EMPTY STOMACH 1 HOUR BEFORE OR 2 HOURS AFTER A MEAL 120 tablet 2   acetaminophen (TYLENOL) 500 MG tablet Take 1,000 mg by mouth every 6 (six) hours as needed for headache or moderate pain (pain).     albuterol (VENTOLIN HFA) 108 (90 Base) MCG/ACT inhaler TAKE 2 PUFFS BY MOUTH EVERY 6 HOURS AS NEEDED FOR WHEEZE OR SHORTNESS OF BREATH (Patient taking differently: Inhale 2 puffs into the lungs every 6 (six) hours as needed for wheezing or shortness of breath.) 18 each 12   Ascorbic Acid (VITAMIN C PO) Take 1 tablet by mouth daily.     aspirin EC 81 MG tablet Take 81 mg by mouth daily.     atorvastatin (LIPITOR) 20 MG tablet Take 1 tablet (20 mg total) by mouth daily. SCHEDULE OFFICE VISIT FOR FUTURE REFILLS 90 tablet 0   benzonatate (TESSALON) 100 MG capsule Take 1 capsule (100 mg total) by mouth daily as needed. 30 capsule 1   budesonide-formoterol (SYMBICORT) 160-4.5 MCG/ACT inhaler Inhale 2 puffs then rinse mouth, twice daily (Patient taking differently: Inhale 2 puffs into the lungs 2 (two) times daily. Inhale 2 puffs then rinse mouth, twice daily) 1 each 12   citalopram (CELEXA) 10 MG tablet TAKE 1 TABLET BY MOUTH EVERY DAY (Patient taking differently: Take 10 mg by mouth daily.) 90 tablet 3   diclofenac Sodium (VOLTAREN) 1 % GEL AAPLY 4 GRAMS TOPICALLY 4 TIMES DAILY 400 g 2   docusate sodium (COLACE) 100 MG capsule Take 100 mg by mouth daily with supper.     fluticasone (FLONASE) 50 MCG/ACT nasal spray PLACE 1 SPRAY INTO BOTH NOSTRILS DAILY AS NEEDED FOR ALLERGIES OR RHINITIS (AT BEDTIME). (Patient  taking differently: Place 1 spray into both nostrils at bedtime as needed for allergies or rhinitis.) 48 mL 1   furosemide (LASIX) 20 MG tablet TAKE 1 TABLET BY MOUITH DAILY FOR 5 DAYS , THEN DAILY AS NEEDED FOR EDEMA 90 tablet 1   LORazepam (ATIVAN) 1 MG tablet TAKE 0.5-1 TABLETS (0.5-1 MG TOTAL) BY MOUTH 2 (TWO) TIMES DAILY AS NEEDED FOR ANXIETY. 60 tablet 3   metoprolol tartrate (LOPRESSOR) 25 MG tablet TAKE 1 TABLET BY MOUTH TWICE A DAY (Patient taking differently: Take 25 mg by mouth 2 (two) times daily.) 180 tablet 2   Multiple Vitamin (MULTIVITAMIN WITH MINERALS) TABS tablet Take 0.5 tablets by mouth daily.     omeprazole (PRILOSEC) 20 MG capsule TAKE 1 CAPSULE (20 MG TOTAL) BY MOUTH DAILY BEFORE BREAKFAST. (Patient taking differently: Take 20 mg by mouth daily.) 90 capsule 3   predniSONE (DELTASONE) 5 MG tablet Take 1 tablet (5 mg total) by mouth daily with breakfast. 90 tablet 3   Tetrahydrozoline HCl (VISINE OP) Place 1 drop into both eyes daily as needed (red eyes).     No current facility-administered medications on file prior to visit.    Past Medical History:  Diagnosis Date   COPD (chronic obstructive pulmonary disease) ([place])    Gastric outlet obstruction 02/03/2018   [name] 02/03/2018   GERD (gastroesophageal reflux disease)  History of hiatal hernia    small/notes 02/03/2018   Hypertension    no meds   Presence of permanent cardiac pacemaker    Prostate cancer ([place]) 02/08/2014   Gleason 4+5=9, PSA 15.65   Radiation    Sinus problem    SSS (sick sinus syndrome) ([facility])    Allergies  Allergen Reactions   Penicillins Rash    Has patient had a PCN reaction causing immediate rash, facial/tongue/throat swelling, SOB or lightheadedness with hypotension: Yes Has patient had a PCN reaction causing severe rash involving mucus membranes or skin necrosis: No Has patient had a PCN reaction that required hospitalization: No Has patient had a PCN reaction occurring within the last  10 years: No If all of the above answers are "NO", then may proceed with Cephalosporin use.    Social History   Socioeconomic History   Marital status: Married    Spouse name: Not on file   Number of children: 3   Years of education: Not on file   Highest education level: Not on file  Occupational History   Occupation: Freight forwarder    Employer: [employer]    Comment: retired   Occupation: Freight forwarder    Comment: [employer]-[place]ired  Tobacco Use   Smoking status: Former    Types: Cigars    Quit date: 09/14/2012    Years since quitting: 9.6   Smokeless tobacco: Never   Tobacco comments:    little cigars; the small ones"  Vaping Use   Vaping Use: Never used  Substance and Sexual Activity   Alcohol use: Not Currently    Alcohol/week: 14.0 standard drinks of alcohol    Types: 14 Cans of beer per week   Drug use: No   Sexual activity: Not Currently  Other Topics Concern   Not on file  Social History Narrative   Lives with wife and 2 dogs in one level home; daughter and her family co-habitate.   Has three daughters, all in , supportive. Five grandchildren.   Wants to return to doing silver sneakers once gym re-opens.         Social Determinants of Health   Financial Resource Strain: Low Risk  (07/22/2021)   Overall Financial Resource Strain (CARDIA)    Difficulty of Paying Living Expenses: Not hard at all  Food Insecurity: No Food Insecurity (07/22/2021)   Hunger Vital Sign    Worried About Running Out of Food in the Last Year: Never true    Ran Out of Food in the Last Year: Never true  Transportation Needs: No Transportation Needs (07/22/2021)   PRAPARE - Hydrologist (Medical): No    Lack of Transportation (Non-Medical): No  Physical Activity: Insufficiently Active (07/22/2021)   Exercise Vital Sign    Days of Exercise per Week: 5 days    Minutes of Exercise per Session: 20 min  Stress: No Stress Concern Present (07/22/2021)   [place]    Feeling of Stress : Not at all  Social Connections: Moderately Integrated (07/22/2021)   Social Connection and Isolation Panel [NHANES]    Frequency of Communication with Friends and Family: More than three times a week    Frequency of Social Gatherings with Friends and Family: More than three times a week    Attends Religious Services: More than 4 times per year    Active Member of [employer] or Organizations: No    Attends Archivist  Meetings: Not on file    Marital Status: Married    There were no vitals filed for this visit. There is no height or weight on file to calculate BMI.  Physical Exam  ASSESSMENT AND PLAN:  There are no diagnoses linked to this encounter.  No orders of the defined types were placed in this encounter.   No problem-specific Assessment & Plan notes found for this encounter.   No follow-ups on file.  [name]. [name], MD  [facility]. [place] office.

## 2022-04-22 ENCOUNTER — Ambulatory Visit: Payer: Medicare Other | Admitting: Family Medicine

## 2022-04-22 ENCOUNTER — Telehealth: Payer: Self-pay | Admitting: Family Medicine

## 2022-04-22 NOTE — Telephone Encounter (Signed)
Pt's wife called to say they over slept and missed their appointment.  They would like to know if they can reschedule and have a phone call visit (instead of an OV) later this week or next week ?  Please advise.

## 2022-04-22 NOTE — Telephone Encounter (Signed)
Yes, just split the 4:30 on Friday into 2 15 minute slots and add them both in there.

## 2022-04-24 ENCOUNTER — Telehealth: Payer: Medicare Other | Admitting: Family Medicine

## 2022-04-27 ENCOUNTER — Telehealth: Payer: Self-pay | Admitting: Oncology

## 2022-04-27 NOTE — Telephone Encounter (Signed)
Called patient regarding upcoming November appointments, spoke with patient's spouse. Patient will be notified.

## 2022-04-28 ENCOUNTER — Telehealth: Payer: Self-pay | Admitting: Cardiovascular Disease

## 2022-04-28 NOTE — Telephone Encounter (Signed)
Tony [name] from [facility] surgery, Dr. Ayesha Rumpf office is calling about patient clearance for surgery.  He had a visit for [place] on 7/31, but nothing was mention about clearance in the office visit notes.

## 2022-04-28 NOTE — Telephone Encounter (Signed)
   Patient Name: Tony [name] [facility]  DOB: 12-27-1940 MRN: [id number]  Primary Cardiologist: Sanda Klein, MD  Chart reviewed as part of pre-operative protocol coverage. Given past medical history and time since last visit, based on ACC/AHA guidelines, Tony [name] would be at acceptable risk for the planned procedure without further cardiovascular testing.   Per Dr. Sallyanne Kuster, primary cardiologist, patient is: "Low to moderate increasing surgical risk due to advanced age and multiple comorbid condition, but not at high risk. Please make sure that his metoprolol is not interrupted perioperatively.  He has a dual-chamber permanent pacemaker that is functioning normally.  He is not pacemaker dependent. The planned surgical procedure should not interfere with pacemaker function.  Okay to stop aspirin for 5 to 7 days if desired, but this is probably not necessary."  I will route this recommendation to the requesting party via Epic fax function and remove from pre-op pool.  Please call with questions.  Lenna Sciara, NP 04/28/2022, 2:18 PM

## 2022-04-28 NOTE — Telephone Encounter (Signed)
I will forward to pre op provider to review. Appt notes did state need preop clearance

## 2022-04-29 ENCOUNTER — Ambulatory Visit: Payer: Self-pay | Admitting: Surgery

## 2022-05-04 ENCOUNTER — Encounter: Payer: Self-pay | Admitting: Cardiovascular Disease

## 2022-05-04 NOTE — Patient Instructions (Signed)
DUE TO COVID-19 ONLY TWO VISITORS  (aged 80 and older)  ARE ALLOWED TO COME WITH YOU AND STAY IN THE WAITING ROOM ONLY DURING PRE OP AND PROCEDURE.   **NO VISITORS ARE ALLOWED IN THE SHORT STAY AREA OR RECOVERY ROOM!!**  IF YOU WILL BE ADMITTED INTO THE HOSPITAL YOU ARE ALLOWED ONLY FOUR SUPPORT PEOPLE DURING VISITATION HOURS ONLY (7 AM -8PM)   The support person(s) must pass our screening, gel in and out, and wear a mask at all times, including in the patient's room. Patients must also wear a mask when staff or their support person are in the room. Visitors GUEST BADGE MUST BE WORN VISIBLY  One adult visitor may remain with you overnight and MUST be in the room by 8 P.M.     Your procedure is scheduled on: 05/15/22   Report to [facility] Main Entrance    Report to admitting at : 5:30 AM   Call this number if you have problems the morning of surgery [phone]  Eat a light diet the day before surgery.  Examples including soups, broths, toast, yogurt, mashed potatoes.  Things to avoid include carbonated beverages (fizzy beverages), raw fruits and raw vegetables, or beans.   If your bowels are filled with gas, your surgeon will have difficulty visualizing your pelvic organs which increases your surgical risks.    Do not eat food :After Midnight.   After Midnight you may have the following liquids until: 4:30 AM DAY OF SURGERY  Water Black Coffee (sugar ok, NO MILK/CREAM OR CREAMERS)  Tea (sugar ok, NO MILK/CREAM OR CREAMERS) regular and decaf                             Plain Jell-O (NO RED)                                           Fruit ices (not with fruit pulp, NO RED)                                     Popsicles (NO RED)                                                                  Juice: apple, WHITE grape, WHITE cranberry Sports drinks like Gatorade (NO RED)             FOLLOW BOWEL PREP AND ANY ADDITIONAL PRE OP INSTRUCTIONS YOU RECEIVED FROM YOUR SURGEON'S  OFFICE!!!   Oral Hygiene is also important to reduce your risk of infection.                                    Remember - BRUSH YOUR TEETH THE MORNING OF SURGERY WITH YOUR REGULAR TOOTHPASTE   Do NOT smoke after Midnight   Take these medicines the morning of surgery with A SIP OF WATER: citalopram,zytiga,metoprolol,prednisone,omeprazole.Use inhalers as usual. Tylenol,lorazepam as needed.  DO NOT TAKE ANY ORAL  DIABETIC MEDICATIONS DAY OF YOUR SURGERY  Bring CPAP mask and tubing day of surgery.                              You may not have any metal on your body including hair pins, jewelry, and body piercing             Do not wear lotions, powders, perfumes/cologne, or deodorant              Men may shave face and neck.   Do not bring valuables to the hospital. [place].   Contacts, dentures or bridgework may not be worn into surgery.   Bring small overnight bag day of surgery.   DO NOT [place]. PHARMACY WILL DISPENSE MEDICATIONS LISTED ON YOUR MEDICATION LIST TO YOU DURING YOUR ADMISSION [place]!    Patients discharged on the day of surgery will not be allowed to drive home.  Someone NEEDS to stay with you for the first 24 hours after anesthesia.   Special Instructions: Bring a copy of your healthcare power of attorney and living will documents         the day of surgery if you haven't scanned them before.              Please read over the following fact sheets you were given: IF YOU HAVE QUESTIONS ABOUT YOUR PRE-OP INSTRUCTIONS PLEASE CALL [phone]     [facility] - Preparing for Surgery Before surgery, you can play an important role.  Because skin is not sterile, your skin needs to be as free of germs as possible.  You can reduce the number of germs on your skin by washing with CHG (chlorahexidine gluconate) soap before surgery.  CHG is an antiseptic cleaner which kills germs and  bonds with the skin to continue killing germs even after washing. Please DO NOT use if you have an allergy to CHG or antibacterial soaps.  If your skin becomes reddened/irritated stop using the CHG and inform your nurse when you arrive at Short Stay. Do not shave (including legs and underarms) for at least 48 hours prior to the first CHG shower.  You may shave your face/neck. Please follow these instructions carefully:  1.  Shower with CHG Soap the night before surgery and the  morning of Surgery.  2.  If you choose to wash your hair, wash your hair first as usual with your  normal  shampoo.  3.  After you shampoo, rinse your hair and body thoroughly to remove the  shampoo.                           4.  Use CHG as you would any other liquid soap.  You can apply chg directly  to the skin and wash                       Gently with a scrungie or clean washcloth.  5.  Apply the CHG Soap to your body ONLY FROM THE NECK DOWN.   Do not use on face/ open  Wound or open sores. Avoid contact with eyes, ears mouth and genitals (private parts).                       Wash face,  Genitals (private parts) with your normal soap.             6.  Wash thoroughly, paying special attention to the area where your surgery  will be performed.  7.  Thoroughly rinse your body with warm water from the neck down.  8.  DO NOT shower/wash with your normal soap after using and rinsing off  the CHG Soap.                9.  Pat yourself dry with a clean towel.            10.  Wear clean pajamas.            11.  Place clean sheets on your bed the night of your first shower and do not  sleep with pets. Day of Surgery : Do not apply any lotions/deodorants the morning of surgery.  Please wear clean clothes to the hospital/surgery center.  FAILURE TO FOLLOW THESE INSTRUCTIONS MAY RESULT IN THE CANCELLATION OF YOUR SURGERY PATIENT SIGNATURE_________________________________  NURSE  SIGNATURE__________________________________  ________________________________________________________________________

## 2022-05-04 NOTE — Progress Notes (Addendum)
For Short Stay: [place] appointment date: Date of COVID positive in last 80 days:  Bowel Prep reminder:   For Anesthesia: PCP - Dr. [name] Cardiologist - Dr. Dani Gobble Croitoru Clearance: Diona Browner: NP: 04/28/22.: EPIC Chest x-ray - 12/24/21. CT cheat: 01/28/22. EKG - 04/13/22 Stress Test -  ECHO - 07/14/21 Cardiac Cath -  Pacemaker/ICD device last checked:03/18/22 Pacemaker orders received: requested./Received Device Rep notified: message left to : Trudee Kuster: [phone]  Spinal Cord Stimulator:  Sleep Study -  CPAP -   Fasting Blood Sugar -  Checks Blood Sugar _____ times a day Date and result of last Hgb A1c-  Blood Thinner Instructions: Aspirin Instructions: will be held 5-7 days as per E. Mone: NP. Last Dose:  Activity level: Can go up a flight of stairs and activities of daily living without stopping and without chest pain and/or shortness of breath   Able to exercise without chest pain and/or shortness of breath   Unable to go up a flight of stairs without chest pain and/or shortness of breath     Anesthesia review: Hx: HTN, O2 dependent,CAD,Pacemaker.  Patient denies shortness of breath, fever, cough and chest pain at PAT appointment   Patient verbalized understanding of instructions that were given to them at the PAT appointment. Patient was also instructed that they will need to review over the PAT instructions again at home before surgery.

## 2022-05-04 NOTE — Progress Notes (Signed)
PERIOPERATIVE PRESCRIPTION FOR IMPLANTED CARDIAC DEVICE PROGRAMMING  Patient Information: Name:  Tony [name]  DOB:  1941/05/25  MRN:  [id number]    Planned Procedure:  Laparoscopic Cholecystectomy.  Surgeon:  Dr. Michaelle Birks  Date of Procedure:  05/15/22  Cautery will be used.  Position during surgery:     Device Information:  Clinic EP Physician:  Mihai Croitoru   Device Type:  Pacemaker Manufacturer and Phone #:  Medtronic: [phone] Pacemaker Dependent?:  No. Date of Last Device Check:  03/18/22 Normal Device Function?:  Yes.    Electrophysiologist's Recommendations:  Have magnet available. Provide continuous ECG monitoring when magnet is used or reprogramming is to be performed.  Procedure may interfere with device function.  Magnet should be placed over device during procedure.  Per Device Clinic Standing Orders, Simone Curia, RN  12:50 PM 05/04/2022

## 2022-05-05 ENCOUNTER — Other Ambulatory Visit: Payer: Self-pay

## 2022-05-05 ENCOUNTER — Encounter (HOSPITAL_COMMUNITY): Payer: Self-pay

## 2022-05-05 ENCOUNTER — Encounter (HOSPITAL_COMMUNITY)
Admission: RE | Admit: 2022-05-05 | Discharge: 2022-05-05 | Disposition: A | Discharge status: Home / Self Care | Payer: Medicare Other | Source: Ambulatory Visit | Attending: Surgery | Admitting: Surgery

## 2022-05-05 DIAGNOSIS — K8020 Calculus of gallbladder without cholecystitis without obstruction: Secondary | ICD-10-CM | POA: Diagnosis not present

## 2022-05-05 DIAGNOSIS — I10 Essential (primary) hypertension: Secondary | ICD-10-CM | POA: Diagnosis not present

## 2022-05-05 DIAGNOSIS — I2510 Atherosclerotic heart disease of native coronary artery without angina pectoris: Secondary | ICD-10-CM | POA: Insufficient documentation

## 2022-05-05 DIAGNOSIS — Z950 Presence of cardiac pacemaker: Secondary | ICD-10-CM | POA: Diagnosis not present

## 2022-05-05 DIAGNOSIS — J449 Chronic obstructive pulmonary disease, unspecified: Secondary | ICD-10-CM | POA: Insufficient documentation

## 2022-05-05 DIAGNOSIS — K8309 Other cholangitis: Secondary | ICD-10-CM | POA: Insufficient documentation

## 2022-05-05 DIAGNOSIS — I495 Sick sinus syndrome: Secondary | ICD-10-CM | POA: Insufficient documentation

## 2022-05-05 DIAGNOSIS — Z01818 Encounter for other preprocedural examination: Secondary | ICD-10-CM | POA: Diagnosis not present

## 2022-05-05 HISTORY — DX: Dyspnea, unspecified: R06.00

## 2022-05-05 HISTORY — DX: Atherosclerotic heart disease of native coronary artery without angina pectoris: I25.10

## 2022-05-05 HISTORY — DX: Anxiety disorder, unspecified: F41.9

## 2022-05-06 ENCOUNTER — Other Ambulatory Visit (HOSPITAL_COMMUNITY): Payer: Self-pay

## 2022-05-06 DIAGNOSIS — G4733 Obstructive sleep apnea (adult) (pediatric): Secondary | ICD-10-CM | POA: Diagnosis not present

## 2022-05-06 NOTE — Anesthesia Preprocedure Evaluation (Addendum)
Anesthesia Evaluation  Patient identified by MRN, date of birth, ID band Patient awake    Reviewed: Allergy & Precautions, NPO status , Patient's Chart, lab work & pertinent test results, reviewed documented beta blocker date and time   Airway Mallampati: III  TM Distance: >3 FB Neck ROM: Full    Dental  (+) Dental Advisory Given, Lower Dentures, Upper Dentures   Pulmonary sleep apnea, Continuous Positive Airway Pressure Ventilation and Oxygen sleep apnea , COPD,  oxygen dependent, former smoker,    Pulmonary exam normal breath sounds clear to auscultation       Cardiovascular hypertension, Pt. on home beta blockers + CAD  Normal cardiovascular exam+ dysrhythmias (SSS) + pacemaker  Rhythm:Regular Rate:Normal     Neuro/Psych PSYCHIATRIC DISORDERS Anxiety negative neurological ROS     GI/Hepatic Neg liver ROS, hiatal hernia, GERD  Medicated,  Endo/Other  Obesity   Renal/GU negative Renal ROS   Prostate cancer     Musculoskeletal negative musculoskeletal ROS (+)   Abdominal   Peds  Hematology negative hematology ROS (+)   Anesthesia Other Findings   Reproductive/Obstetrics                           Anesthesia Physical Anesthesia Plan  ASA: 4  Anesthesia Plan: General   Post-op Pain Management: Tylenol PO (pre-op)*   Induction: Intravenous  PONV Risk Score and Plan: 3 and Dexamethasone and Ondansetron  Airway Management Planned: Oral ETT  Additional Equipment:   Intra-op Plan:   Post-operative Plan: Extubation in OR  Informed Consent: I have reviewed the patients History and Physical, chart, labs and discussed the procedure including the risks, benefits and alternatives for the proposed anesthesia with the patient or authorized representative who has indicated his/her understanding and acceptance.     Dental advisory given  Plan Discussed with: CRNA  Anesthesia Plan  Comments: (See PAT note 05/05/22  **MAGNET IN THE ROOM FOR PPM**)     Anesthesia Quick Evaluation

## 2022-05-06 NOTE — Progress Notes (Signed)
Anesthesia Chart Review   Case: [id number] Date/Time: 05/15/22 0715   Procedure: LAPAROSCOPIC CHOLECYSTECTOMY   Anesthesia type: General   Pre-op diagnosis: GALLSTONES, CHOLANGITIS   Location: [facility] ROOM 04 / [facility]   Surgeons: [name], MD       DISCUSSION:81 y.o. former smoker with h/o HTN, COPD, CAD, SSS with pacemaker in place (device orders in 05/04/22, magnet should be placed over device during procedure), gallstones, cholangitis scheduled for above procedure 05/15/2022 with Dr. Michaelle Birks.   Per cardiology, "Chart reviewed as part of pre-operative protocol coverage. Given past medical history and time since last visit, based on ACC/AHA guidelines, Tony [name] would be at acceptable risk for the planned procedure without further cardiovascular testing.    Per Dr. Sallyanne Kuster, primary cardiologist, patient is: "Low to moderate increasing surgical risk due to advanced age and multiple comorbid condition, but not at high risk. Please make sure that his metoprolol is not interrupted perioperatively.  He has a dual-chamber permanent pacemaker that is functioning normally.  He is not pacemaker dependent. The planned surgical procedure should not interfere with pacemaker function.  Okay to stop aspirin for 5 to 7 days if desired, but this is probably not necessary."  Anticipate pt can proceed with planned procedure barring acute status change.   VS: BP 103/70   Pulse 82   Temp 36.6 C (Oral)   Ht '5\' 8"'$  (1.727 m)   Wt 96.2 kg   SpO2 93% Comment: room air.  BMI 32.23 kg/m   PROVIDERS: [name], [name], MD is PCP   Cardiologist - Dr. Dani Gobble [place] LABS: Labs reviewed: Acceptable for surgery. (all labs ordered are listed, but only abnormal results are displayed)  Labs Reviewed - No data to display   IMAGES:   EKG:   CV: Echo 07/14/21 1. Left ventricular ejection fraction, by estimation, is 60 to 65%. The  left ventricle has normal function. The left ventricle has no  regional  wall motion abnormalities. Left ventricular diastolic parameters were  normal.   2. Right ventricular systolic function is normal. The right ventricular  size is normal.   3. The mitral valve is normal in structure. No evidence of mitral valve  regurgitation. No evidence of mitral stenosis.   4. The aortic valve is calcified. Aortic valve regurgitation is trivial.  Mild aortic valve sclerosis is present, with no evidence of aortic valve  stenosis.   5. Aortic dilatation noted. There is mild dilatation of the aortic root,  measuring 40 mm. There is mild dilatation of the ascending aorta,  measuring 43 mm.   6. The inferior vena cava is normal in size with greater than 50%  respiratory variability, suggesting right atrial pressure of 3 mmHg Past Medical History:  Diagnosis Date   Anxiety    COPD (chronic obstructive pulmonary disease) ([place])    Coronary artery disease    Dyspnea    Gastric outlet obstruction 02/03/2018   [name] 02/03/2018   GERD (gastroesophageal reflux disease)    History of hiatal hernia    small/notes 02/03/2018   Hypertension    no meds   Presence of permanent cardiac pacemaker    Prostate cancer (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   Radiation    Sinus problem    SSS (sick sinus syndrome) ([place])     Past Surgical History:  Procedure Laterality Date   APPENDECTOMY  2019   hx hematoma s/p appendectomy   ESOPHAGOGASTRODUODENOSCOPY (EGD) WITH PROPOFOL N/A 02/03/2018  Procedure: ESOPHAGOGASTRODUODENOSCOPY (EGD) WITH PROPOFOL;  Surgeon: Clarene Essex, MD;  Location: [facility];  Service: Endoscopy;  Laterality: N/A;   FRACTURE SURGERY     INSERT / REPLACE / REMOVE PACEMAKER  06/14/2018   LAPAROSCOPIC APPENDECTOMY N/A 01/21/2018   Procedure: APPENDECTOMY LAPAROSCOPIC;  Surgeon: Kinsinger, Arta Bruce, MD;  Location: [facility];  Service: General;  Laterality: N/A;   PACEMAKER IMPLANT N/A 06/14/2018   Procedure: PACEMAKER IMPLANT - Dual Chamber;   Surgeon: Sanda Klein, MD;  Location: [facility];  Service: Cardiovascular;  Laterality: N/A;   PROSTATE BIOPSY  02/08/14   gleason 4+5=9, 12/12 cores positive, 54 gm   RADIOACTIVE SEED IMPLANT N/A 06/29/2014   Procedure: RADIOACTIVE SEED IMPLANT;  Surgeon: Bernestine Amass, MD;  Location: [facility];  Service: Urology;  Laterality: N/A;   WRIST FRACTURE SURGERY Right 1980s    MEDICATIONS:  abiraterone acetate (ZYTIGA) 250 MG tablet   acetaminophen (TYLENOL) 500 MG tablet   albuterol (VENTOLIN HFA) 108 (90 Base) MCG/ACT inhaler   aspirin EC 81 MG tablet   atorvastatin (LIPITOR) 20 MG tablet   benzonatate (TESSALON) 100 MG capsule   budesonide-formoterol (SYMBICORT) 160-4.5 MCG/ACT inhaler   citalopram (CELEXA) 10 MG tablet   diclofenac Sodium (VOLTAREN) 1 % GEL   fluticasone (FLONASE) 50 MCG/ACT nasal spray   furosemide (LASIX) 20 MG tablet   LORazepam (ATIVAN) 1 MG tablet   magnesium hydroxide (MILK OF MAGNESIA) 400 MG/5ML suspension   Melatonin 10 MG CAPS   metoprolol tartrate (LOPRESSOR) 25 MG tablet   Multiple Vitamin (MULTIVITAMIN WITH MINERALS) TABS tablet   omeprazole (PRILOSEC) 20 MG capsule   predniSONE (DELTASONE) 5 MG tablet   No current facility-administered medications for this encounter.     Tony Felix Ward, PA-C [facility] [phone]

## 2022-05-11 NOTE — Progress Notes (Unsigned)
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 He was last seen on 01/14/22.  Since his last visit he has seen surgeon for recurrent cholangitis/cholelithiasis with intermittent jaundice . He is scheduled for laparoscopic cholecystectomy on 05/15/22.  Hypertension:  Medications:Metoprolol Tartrate 25 mg bid BP readings at home:120's/70's. Side effects:None. Negative for unusual or severe headache, visual changes, exertional chest pain,  focal weakness, or worsening edema.  Lab Results  Component Value Date   CREATININE 1.09 04/17/2022   BUN 16 04/17/2022   NA 137 04/17/2022   K 4.7 04/17/2022   CL 103 04/17/2022   CO2 27 04/17/2022   Glucose elevated at 133 on 04/17/22. No hx of diabetes. Last HgA1C 5.9 in 12/2021.  Anxiety and insomnia: He is on Lorazepam 1 mg bid prn and Celexa 10 mg daily. He has not taken Lorazepam since yesterday, his pharmacy does ot have medication available. He thinks he has received fewer tabs.  Anxiety is well controlled. Insomnia is aggravated by nocturia and persistent cough. He has not identified exacerbating or alleviating factors. Benzonatate helps. Negative for hemoptysis. COPD: SOB stable. No associated wheezing. He is on Symbicort 160-4.5 mcg bid, his insurance will not continue covering this medication after 08/2022. He follows with pulmonologist.  Review of Systems  Constitutional:  Positive for fatigue. Negative for activity change, appetite change and fever.  HENT:  Positive for postnasal drip and rhinorrhea. Negative for nosebleeds and sore throat.   Gastrointestinal:  Negative for abdominal pain, nausea and vomiting.  Endocrine: Positive for polyphagia. Negative for polydipsia and polyuria.  Genitourinary:  Negative for decreased urine volume,  dysuria and hematuria.  Skin:  Negative for rash.  Neurological:  Negative for syncope, facial asymmetry and weakness.  Psychiatric/Behavioral:  Positive for sleep disturbance. Negative for confusion.   Rest see pertinent positives and negatives per HPI.  Current Outpatient Medications on File Prior to Visit  Medication Sig Dispense Refill   abiraterone acetate (ZYTIGA) 250 MG tablet TAKE 4 TABLETS (1,000 MG TOTAL) BY MOUTH DAILY. TAKE ON AN EMPTY STOMACH 1 HOUR BEFORE OR 2 HOURS AFTER A MEAL 120 tablet 2   acetaminophen (TYLENOL) 500 MG tablet Take 1,000 mg by mouth every 6 (six) hours as needed for headache or moderate pain (pain).     albuterol (VENTOLIN HFA) 108 (90 Base) MCG/ACT inhaler TAKE 2 PUFFS BY MOUTH EVERY 6 HOURS AS NEEDED FOR WHEEZE OR SHORTNESS OF BREATH 18 each 12   aspirin EC 81 MG tablet Take 81 mg by mouth daily.     benzonatate (TESSALON) 100 MG capsule Take 1 capsule (100 mg total) by mouth daily as needed. 30 capsule 1   budesonide-formoterol (SYMBICORT) 160-4.5 MCG/ACT inhaler Inhale 2 puffs then rinse mouth, twice daily (Patient taking differently: Inhale 2 puffs into the lungs 2 (two) times daily. Inhale 2 puffs then rinse mouth, twice daily) 1 each 12   diclofenac Sodium (VOLTAREN) 1 % GEL AAPLY 4 GRAMS TOPICALLY 4 TIMES DAILY (Patient taking differently: Apply 1 Application topically 4 (four) times daily as needed (pain).) 400 g 2   fluticasone (FLONASE) 50 MCG/ACT nasal spray PLACE 1 SPRAY INTO BOTH NOSTRILS DAILY AS NEEDED FOR ALLERGIES OR RHINITIS (AT BEDTIME). 48 mL 1   magnesium hydroxide (MILK OF MAGNESIA) 400 MG/5ML suspension Take by mouth daily as needed for mild  constipation.     Melatonin 10 MG CAPS Take 20 mg by mouth at bedtime.     Multiple Vitamin (MULTIVITAMIN WITH MINERALS) TABS tablet Take 0.5 tablets by mouth daily.     predniSONE (DELTASONE) 5 MG tablet Take 1 tablet (5 mg total) by mouth daily with breakfast. 90 tablet 3   No current  facility-administered medications on file prior to visit.   Past Medical History:  Diagnosis Date   Anxiety    COPD (chronic obstructive pulmonary disease) ([place])    Coronary artery disease    Dyspnea    Gastric outlet obstruction 02/03/2018   [name] 02/03/2018   GERD (gastroesophageal reflux disease)    History of hiatal hernia    small/notes 02/03/2018   Hypertension    no meds   Presence of permanent cardiac pacemaker    Prostate cancer ([place]) 02/08/2014   Gleason 4+5=9, PSA 15.65   Radiation    Sinus problem    SSS (sick sinus syndrome) ([facility])    Allergies  Allergen Reactions   Penicillins Rash    Has patient had a PCN reaction causing immediate rash, facial/tongue/throat swelling, SOB or lightheadedness with hypotension: Yes Has patient had a PCN reaction causing severe rash involving mucus membranes or skin necrosis: No Has patient had a PCN reaction that required hospitalization: No Has patient had a PCN reaction occurring within the last 10 years: No If all of the above answers are "NO", then may proceed with Cephalosporin use.   Social History   Socioeconomic History   Marital status: Married    Spouse name: Not on file   Number of children: 3   Years of education: Not on file   Highest education level: Not on file  Occupational History   Occupation: Freight forwarder    Employer: [employer]    Comment: retired   Occupation: Freight forwarder    Comment: [employer]-[place]  Tobacco Use   Smoking status: Former    Types: Cigars    Quit date: 09/14/2012    Years since quitting: 9.6   Smokeless tobacco: Never   Tobacco comments:    little cigars; the small ones"  Vaping Use   Vaping Use: Never used  Substance and Sexual Activity   Alcohol use: Not Currently    Alcohol/week: 14.0 standard drinks of alcohol    Types: 14 Cans of beer per week   Drug use: No   Sexual activity: Not Currently  Other Topics Concern   Not on file  Social History Narrative   Lives with wife and 2  dogs in one level home; daughter and her family co-habitate.   Has three daughters, all in [place], supportive. Five grandchildren.   Wants to return to doing silver sneakers once gym re-opens.         Social Determinants of Health   Financial Resource Strain: Low Risk  (07/22/2021)   Overall Financial Resource Strain (CARDIA)    Difficulty of Paying Living Expenses: Not hard at all  Food Insecurity: No Food Insecurity (07/22/2021)   Hunger Vital Sign    Worried About Running Out of Food in the Last Year: Never true    Ran Out of Food in the Last Year: Never true  Transportation Needs: No Transportation Needs (07/22/2021)   PRAPARE - Hydrologist (Medical): No    Lack of Transportation (Non-Medical): No  Physical Activity: Insufficiently Active (07/22/2021)   Exercise Vital Sign    Days of Exercise per Week:  5 days    Minutes of Exercise per Session: 20 min  Stress: No Stress Concern Present (07/22/2021)   [place]    Feeling of Stress : Not at all  Social Connections: Moderately Integrated (07/22/2021)   Social Connection and Isolation Panel [NHANES]    Frequency of Communication with Friends and Family: More than three times a week    Frequency of Social Gatherings with Friends and Family: More than three times a week    Attends Religious Services: More than 4 times per year    Active Member of [employer] or Organizations: No    Attends Archivist Meetings: Not on file    Marital Status: Married   Vitals:   05/12/22 0913  BP: 120/70  Pulse: 64  Resp: 16  Temp: (!) 97.5 F (36.4 C)  SpO2: 97%  Body mass index is 32.84 kg/m. Physical Exam Vitals and nursing note reviewed.  Constitutional:      General: He is not in acute distress.    Appearance: He is well-developed and well-groomed.  HENT:     Head: Normocephalic and atraumatic.     Mouth/Throat:     Mouth: Mucous membranes  are moist.  Eyes:     Conjunctiva/sclera: Conjunctivae normal.  Cardiovascular:     Rate and Rhythm: Normal rate and regular rhythm.     Heart sounds: No murmur heard.    Comments: DP pulses palpable. Pitting LE edema 1+, bilateral. Pulmonary:     Effort: Pulmonary effort is normal. No respiratory distress.     Breath sounds: Normal breath sounds.     Comments: A few coughing spells during visit. Abdominal:     Palpations: Abdomen is soft. There is no hepatomegaly or mass.     Tenderness: There is no abdominal tenderness.  Lymphadenopathy:     Cervical: No cervical adenopathy.  Skin:    General: Skin is warm.     Coloration: Skin is jaundiced.     Findings: No erythema or rash.  Neurological:     Mental Status: He is alert and oriented to person, place, and time.     Comments: Gait assisted by a cane.   ASSESSMENT AND PLAN:   [name] was seen today for follow-up.  Diagnoses and all orders for this visit: Orders Placed This Encounter  Procedures   POC HgB A1c   Lab Results  Component Value Date   HGBA1C 6.0 05/12/2022   Insomnia, unspecified type Stable. Continue Lorazepam 1 mg at bedtime and good sleep hygiene. If his pharmacy does not have Lorazepam, he may need to change pharmacies. Instructed to count Lorazepam tabs right after receiving rx at his pharmacy. Stressed the importance of keeping medication in a safe place.  Prediabetes HgA1C went from 5.9 to 6.0. Consistently with a healthy life style encouraged for diabetes prevention. On chronic Prednisone.  Essential hypertension, benign BP adequately controlled. Continue current management: Metoprolol Tartrate 25 mg bid DASH/low salt diet to continue. Continue monitoring BP at home.  Anxiety disorder due to known physiological condition Problem is stable. Continue Celexa 10 mg daily and Lorazepam 1 mg bid prn. We discussed some side effects of medications.  Chronic cough Benzonatate 100 mg daily as  needed helps, so no changes. Could be related to COPD, Spiriva 2.5 mcg added today. Allergies and GERD can also be contributing factors.Continue Omeprazole and Flonase nasal spray daily as needed.  COPD mixed type ([facility]) Continue Symbicort 160-4.5 mcg  bid. Instructed to address insurance issues with his pulmonologist, may be eligible for pt assistance program. Spiriva 2.5 mcg 2 puff daily added today, samples given. Continue following with pulmonologist.  Return in about 6 months (around 11/12/2022).  Wiley Magan G. [name], MD  [facility]. [place] office.

## 2022-05-12 ENCOUNTER — Other Ambulatory Visit: Payer: Self-pay

## 2022-05-12 ENCOUNTER — Telehealth: Payer: Self-pay | Admitting: Family Medicine

## 2022-05-12 ENCOUNTER — Encounter: Payer: Self-pay | Admitting: Family Medicine

## 2022-05-12 ENCOUNTER — Ambulatory Visit (INDEPENDENT_AMBULATORY_CARE_PROVIDER_SITE_OTHER): Payer: Medicare Other | Admitting: Family Medicine

## 2022-05-12 ENCOUNTER — Other Ambulatory Visit (HOSPITAL_COMMUNITY): Payer: Self-pay

## 2022-05-12 VITALS — BP 120/70 | HR 64 | Temp 97.5°F | Resp 16 | Ht 68.0 in | Wt 216.0 lb

## 2022-05-12 DIAGNOSIS — I10 Essential (primary) hypertension: Secondary | ICD-10-CM | POA: Diagnosis not present

## 2022-05-12 DIAGNOSIS — F418 Other specified anxiety disorders: Secondary | ICD-10-CM

## 2022-05-12 DIAGNOSIS — R600 Localized edema: Secondary | ICD-10-CM

## 2022-05-12 DIAGNOSIS — G4700 Insomnia, unspecified: Secondary | ICD-10-CM

## 2022-05-12 DIAGNOSIS — R053 Chronic cough: Secondary | ICD-10-CM

## 2022-05-12 DIAGNOSIS — R7303 Prediabetes: Secondary | ICD-10-CM | POA: Diagnosis not present

## 2022-05-12 DIAGNOSIS — F064 Anxiety disorder due to known physiological condition: Secondary | ICD-10-CM | POA: Diagnosis not present

## 2022-05-12 DIAGNOSIS — J449 Chronic obstructive pulmonary disease, unspecified: Secondary | ICD-10-CM

## 2022-05-12 LAB — POCT GLYCOSYLATED HEMOGLOBIN (HGB A1C): HbA1c, POC (prediabetic range): 6 % (ref 5.7–6.4)

## 2022-05-12 MED ORDER — METOPROLOL TARTRATE 25 MG PO TABS: 25.0000 mg | ORAL_TABLET | Freq: Two times a day (BID) | ORAL | 2 refills | Status: DC

## 2022-05-12 MED ORDER — OMEPRAZOLE 20 MG PO CPDR: 20.0000 mg | DELAYED_RELEASE_CAPSULE | Freq: Every day | ORAL | 3 refills | Status: DC

## 2022-05-12 MED ORDER — ATORVASTATIN CALCIUM 20 MG PO TABS: 20.0000 mg | ORAL_TABLET | Freq: Every day | ORAL | 1 refills | Status: DC

## 2022-05-12 MED ORDER — FUROSEMIDE 20 MG PO TABS: 20.0000 mg | ORAL_TABLET | Freq: Every day | ORAL | 1 refills | Status: DC

## 2022-05-12 MED ORDER — CITALOPRAM HYDROBROMIDE 10 MG PO TABS: 10.0000 mg | ORAL_TABLET | Freq: Every day | ORAL | 3 refills | Status: DC

## 2022-05-12 NOTE — Telephone Encounter (Signed)
The 1 mg tablet is on back order, not discontinued.

## 2022-05-12 NOTE — Patient Instructions (Addendum)
A few things to remember from today's visit:   Insomnia, unspecified type  Essential hypertension, benign  Anxiety disorder due to known physiological condition  Prediabetes - Plan: POC HgB A1c  Chronic cough  COPD mixed type ([place])  If you need refills please call your pharmacy. Do not use My Chart to request refills or for acute issues that need immediate attention.   Spiriva sample given today to try 2 puff daily. No changes in Symbicort dose. Rest unchanged.  Please be sure medication list is accurate. If a new problem present, please set up appointment sooner than planned today.  Prediabetes Eating Plan Prediabetes is a condition that causes blood sugar (glucose) levels to be higher than normal. This increases the risk for developing type 2 diabetes (type 2 diabetes mellitus). Working with a health care provider or nutrition specialist (dietitian) to make diet and lifestyle changes can help prevent the onset of diabetes. These changes may help you: Control your blood glucose levels. Improve your cholesterol levels. Manage your blood pressure. What are tips for following this plan? Reading food labels Read food labels to check the amount of fat, salt (sodium), and sugar in prepackaged foods. Avoid foods that have: Saturated fats. Trans fats. Added sugars. Avoid foods that have more than 300 milligrams (mg) of sodium per serving. Limit your sodium intake to less than 2,300 mg each day. Shopping Avoid buying pre-made and processed foods. Avoid buying drinks with added sugar. Cooking Cook with olive oil. Do not use butter, lard, or ghee. Bake, broil, grill, steam, or boil foods. Avoid frying. Meal planning  Work with your dietitian to create an eating plan that is right for you. This may include tracking how many calories you take in each day. Use a food diary, notebook, or mobile application to track what you eat at each meal. Consider following a Mediterranean diet.  This includes: Eating several servings of fresh fruits and vegetables each day. Eating fish at least twice a week. Eating one serving each day of whole grains, beans, nuts, and seeds. Using olive oil instead of other fats. Limiting alcohol. Limiting red meat. Using nonfat or low-fat dairy products. Consider following a plant-based diet. This includes dietary choices that focus on eating mostly vegetables and fruit, grains, beans, nuts, and seeds. If you have high blood pressure, you may need to limit your sodium intake or follow a diet such as the DASH (Dietary Approaches to Stop Hypertension) eating plan. The DASH diet aims to lower high blood pressure. Lifestyle Set weight loss goals with help from your health care team. It is recommended that most people with prediabetes lose 7% of their body weight. Exercise for at least 30 minutes 5 or more days a week. Attend a support group or seek support from a mental health counselor. Take over-the-counter and prescription medicines only as told by your health care provider. What foods are recommended? Fruits Berries. Bananas. Apples. Oranges. Grapes. Papaya. Mango. Pomegranate. Kiwi. Grapefruit. Cherries. Vegetables Lettuce. Spinach. Peas. Beets. Cauliflower. Cabbage. Broccoli. Carrots. Tomatoes. Squash. Eggplant. Herbs. Peppers. Onions. Cucumbers. Brussels sprouts. Grains Whole grains, such as whole-wheat or whole-grain breads, crackers, cereals, and pasta. Unsweetened oatmeal. Bulgur. Barley. Quinoa. Brown rice. Corn or whole-wheat flour tortillas or taco shells. Meats and other proteins Seafood. Poultry without skin. Lean cuts of pork and beef. Tofu. Eggs. Nuts. Beans. Dairy Low-fat or fat-free dairy products, such as yogurt, cottage cheese, and cheese. Beverages Water. Tea. Coffee. Sugar-free or diet soda. Seltzer water. Low-fat or nonfat  milk. Milk alternatives, such as soy or almond milk. Fats and oils Olive oil. Canola oil. Sunflower  oil. Grapeseed oil. Avocado. Walnuts. Sweets and desserts Sugar-free or low-fat pudding. Sugar-free or low-fat ice cream and other frozen treats. Seasonings and condiments Herbs. Sodium-free spices. Mustard. Relish. Low-salt, low-sugar ketchup. Low-salt, low-sugar barbecue sauce. Low-fat or fat-free mayonnaise. The items listed above may not be a complete list of recommended foods and beverages. Contact a dietitian for more information. What foods are not recommended? Fruits Fruits canned with syrup. Vegetables Canned vegetables. Frozen vegetables with butter or cream sauce. Grains Refined white flour and flour products, such as bread, pasta, snack foods, and cereals. Meats and other proteins Fatty cuts of meat. Poultry with skin. Breaded or fried meat. Processed meats. Dairy Full-fat yogurt, cheese, or milk. Beverages Sweetened drinks, such as iced tea and soda. Fats and oils Butter. Lard. Ghee. Sweets and desserts Baked goods, such as cake, cupcakes, pastries, cookies, and cheesecake. Seasonings and condiments Spice mixes with added salt. Ketchup. Barbecue sauce. Mayonnaise. The items listed above may not be a complete list of foods and beverages that are not recommended. Contact a dietitian for more information. Where to find more information American Diabetes Association: www.diabetes.org Summary You may need to make diet and lifestyle changes to help prevent the onset of diabetes. These changes can help you control blood sugar, improve cholesterol levels, and manage blood pressure. Set weight loss goals with help from your health care team. It is recommended that most people with prediabetes lose 7% of their body weight. Consider following a Mediterranean diet. This includes eating plenty of fresh fruits and vegetables, whole grains, beans, nuts, seeds, fish, and low-fat dairy, and using olive oil instead of other fats. This information is not intended to replace advice given to  you by your health care provider. Make sure you discuss any questions you have with your health care provider. Document Revised: 11/30/2019 Document Reviewed: 11/30/2019 Elsevier Patient Education  [place].

## 2022-05-12 NOTE — Telephone Encounter (Signed)
Pt wife is calling and said manufacture no longer makes LORazepam (ATIVAN) 1 MG tablet and would like to go up on mg  [id number] [place]:  [phone] Fax:  [phone]

## 2022-05-12 NOTE — Telephone Encounter (Signed)
If he wants he try a different pharmacy. Thanks, BJ

## 2022-05-13 ENCOUNTER — Other Ambulatory Visit: Payer: Self-pay | Admitting: Family Medicine

## 2022-05-13 ENCOUNTER — Telehealth: Payer: Self-pay | Admitting: Family Medicine

## 2022-05-13 DIAGNOSIS — F418 Other specified anxiety disorders: Secondary | ICD-10-CM

## 2022-05-13 DIAGNOSIS — J9611 Chronic respiratory failure with hypoxia: Secondary | ICD-10-CM | POA: Diagnosis not present

## 2022-05-13 MED ORDER — LORAZEPAM 1 MG PO TABS: 0.5000 mg | ORAL_TABLET | Freq: Two times a day (BID) | ORAL | 0 refills | Status: DC | PRN

## 2022-05-13 NOTE — Addendum Note (Signed)
Addended by: Rodrigo Ran on: 05/13/2022 03:04 PM   Modules accepted: Orders

## 2022-05-13 NOTE — Telephone Encounter (Signed)
Rx has been sent in. 

## 2022-05-13 NOTE — Telephone Encounter (Signed)
Pt wife is aware rx sent [facility] in [place]

## 2022-05-13 NOTE — Telephone Encounter (Signed)
I talked with patient's wife. They are okay trying a different pharmacy.  I called and spoke with [facility] in [place] and they do have the Rx in stock.   Rx pended for you to send in.

## 2022-05-13 NOTE — Telephone Encounter (Signed)
30 days Lorazepam refill sent to [facility]. Thanks, BJ

## 2022-05-13 NOTE — Telephone Encounter (Signed)
Pt's spouse was calling to check status of refill. CMA stated we are just waiting for MD to sign off on it.

## 2022-05-14 ENCOUNTER — Other Ambulatory Visit (HOSPITAL_COMMUNITY): Payer: Self-pay

## 2022-05-14 ENCOUNTER — Telehealth: Payer: Self-pay | Admitting: Family Medicine

## 2022-05-14 DIAGNOSIS — F418 Other specified anxiety disorders: Secondary | ICD-10-CM

## 2022-05-14 MED ORDER — SPIRIVA RESPIMAT 2.5 MCG/ACT IN AERS: 2.0000 | INHALATION_SPRAY | Freq: Every day | RESPIRATORY_TRACT | 0 refills | Status: DC

## 2022-05-14 MED ORDER — BENZONATATE 100 MG PO CAPS: 100.0000 mg | ORAL_CAPSULE | Freq: Every day | ORAL | 1 refills | Status: DC | PRN

## 2022-05-14 NOTE — Telephone Encounter (Signed)
Pt wife call and stated no Pharmacy have Lorazepam in and she stated she want it sent to  [facility]) - [place], [place] Phone:  [phone]  Fax:  [phone]

## 2022-05-15 ENCOUNTER — Encounter (HOSPITAL_COMMUNITY): Payer: Self-pay | Admitting: Surgery

## 2022-05-15 ENCOUNTER — Encounter (HOSPITAL_COMMUNITY)
Admission: RE | Disposition: A | Discharge status: Home / Self Care | Payer: Self-pay | Source: Ambulatory Visit | Attending: Surgery

## 2022-05-15 ENCOUNTER — Ambulatory Visit (HOSPITAL_COMMUNITY)
Admission: RE | Admit: 2022-05-15 | Discharge: 2022-05-16 | Disposition: A | Discharge status: Home / Self Care | Payer: Medicare Other | Source: Ambulatory Visit | Attending: Surgery | Admitting: Surgery

## 2022-05-15 ENCOUNTER — Other Ambulatory Visit: Payer: Self-pay

## 2022-05-15 ENCOUNTER — Ambulatory Visit (HOSPITAL_BASED_OUTPATIENT_CLINIC_OR_DEPARTMENT_OTHER): Payer: Medicare Other | Admitting: Certified Registered"

## 2022-05-15 ENCOUNTER — Ambulatory Visit (HOSPITAL_COMMUNITY): Payer: Medicare Other | Admitting: Physician Assistant

## 2022-05-15 DIAGNOSIS — J449 Chronic obstructive pulmonary disease, unspecified: Secondary | ICD-10-CM | POA: Diagnosis not present

## 2022-05-15 DIAGNOSIS — Z9981 Dependence on supplemental oxygen: Secondary | ICD-10-CM | POA: Diagnosis not present

## 2022-05-15 DIAGNOSIS — I2510 Atherosclerotic heart disease of native coronary artery without angina pectoris: Secondary | ICD-10-CM | POA: Insufficient documentation

## 2022-05-15 DIAGNOSIS — Z8619 Personal history of other infectious and parasitic diseases: Secondary | ICD-10-CM | POA: Insufficient documentation

## 2022-05-15 DIAGNOSIS — K8010 Calculus of gallbladder with chronic cholecystitis without obstruction: Secondary | ICD-10-CM | POA: Insufficient documentation

## 2022-05-15 DIAGNOSIS — I10 Essential (primary) hypertension: Secondary | ICD-10-CM

## 2022-05-15 DIAGNOSIS — Z87891 Personal history of nicotine dependence: Secondary | ICD-10-CM

## 2022-05-15 DIAGNOSIS — Z950 Presence of cardiac pacemaker: Secondary | ICD-10-CM | POA: Insufficient documentation

## 2022-05-15 DIAGNOSIS — K8309 Other cholangitis: Secondary | ICD-10-CM

## 2022-05-15 DIAGNOSIS — K8020 Calculus of gallbladder without cholecystitis without obstruction: Secondary | ICD-10-CM | POA: Diagnosis not present

## 2022-05-15 DIAGNOSIS — I9581 Postprocedural hypotension: Secondary | ICD-10-CM

## 2022-05-15 DIAGNOSIS — I495 Sick sinus syndrome: Secondary | ICD-10-CM | POA: Diagnosis not present

## 2022-05-15 DIAGNOSIS — Z6832 Body mass index (BMI) 32.0-32.9, adult: Secondary | ICD-10-CM | POA: Insufficient documentation

## 2022-05-15 DIAGNOSIS — G4730 Sleep apnea, unspecified: Secondary | ICD-10-CM | POA: Insufficient documentation

## 2022-05-15 DIAGNOSIS — Z8546 Personal history of malignant neoplasm of prostate: Secondary | ICD-10-CM | POA: Insufficient documentation

## 2022-05-15 DIAGNOSIS — Z8616 Personal history of COVID-19: Secondary | ICD-10-CM | POA: Diagnosis not present

## 2022-05-15 DIAGNOSIS — E669 Obesity, unspecified: Secondary | ICD-10-CM | POA: Insufficient documentation

## 2022-05-15 DIAGNOSIS — K219 Gastro-esophageal reflux disease without esophagitis: Secondary | ICD-10-CM | POA: Diagnosis not present

## 2022-05-15 HISTORY — PX: CHOLECYSTECTOMY: SHX55

## 2022-05-15 LAB — CBC
HCT: 39.2 % (ref 39.0–52.0)
Hemoglobin: 12.9 g/dL — ABNORMAL LOW (ref 13.0–17.0)
MCH: 30.6 pg (ref 26.0–34.0)
MCHC: 32.9 g/dL (ref 30.0–36.0)
MCV: 92.9 fL (ref 80.0–100.0)
Platelets: 191 10*3/uL (ref 150–400)
RBC: 4.22 MIL/uL (ref 4.22–5.81)
RDW: 13.6 % (ref 11.5–15.5)
WBC: 6.8 10*3/uL (ref 4.0–10.5)
nRBC: 0 % (ref 0.0–0.2)

## 2022-05-15 LAB — CREATININE, SERUM
Creatinine, Ser: 1.16 mg/dL (ref 0.61–1.24)
GFR, Estimated: >60 mL/min (ref >60)

## 2022-05-15 SURGERY — LAPAROSCOPIC CHOLECYSTECTOMY
Anesthesia: General

## 2022-05-15 MED ORDER — ONDANSETRON HCL 4 MG/2ML IJ SOLN: 4.0000 mg | Freq: Four times a day (QID) | INTRAMUSCULAR | Status: DC | PRN

## 2022-05-15 MED ORDER — LACTATED RINGERS IR SOLN
Status: DC | PRN
  Administered 2022-05-15: 1000 mL

## 2022-05-15 MED ORDER — TIOTROPIUM BROMIDE MONOHYDRATE 2.5 MCG/ACT IN AERS: 2.0000 | INHALATION_SPRAY | Freq: Every day | RESPIRATORY_TRACT | Status: DC

## 2022-05-15 MED ORDER — ENOXAPARIN SODIUM 40 MG/0.4ML IJ SOSY
40.0000 mg | PREFILLED_SYRINGE | INTRAMUSCULAR | Status: DC
  Administered 2022-05-16: 40 mg via SUBCUTANEOUS
  Filled 2022-05-15: qty 0.4

## 2022-05-15 MED ORDER — ACETAMINOPHEN 500 MG PO TABS
1000.0000 mg | ORAL_TABLET | Freq: Once | ORAL | Status: AC
  Administered 2022-05-15: 1000 mg via ORAL
  Filled 2022-05-15: qty 2

## 2022-05-15 MED ORDER — CHLORHEXIDINE GLUCONATE CLOTH 2 % EX PADS: 6.0000 | MEDICATED_PAD | Freq: Once | CUTANEOUS | Status: DC

## 2022-05-15 MED ORDER — ONDANSETRON HCL 4 MG/2ML IJ SOLN
INTRAMUSCULAR | Status: AC
  Filled 2022-05-15: qty 2

## 2022-05-15 MED ORDER — PROPOFOL 10 MG/ML IV BOLUS
INTRAVENOUS | Status: DC | PRN
  Administered 2022-05-15: 130 mg via INTRAVENOUS

## 2022-05-15 MED ORDER — TRAMADOL HCL 50 MG PO TABS: 50.0000 mg | ORAL_TABLET | Freq: Four times a day (QID) | ORAL | Status: DC | PRN

## 2022-05-15 MED ORDER — FENTANYL CITRATE PF 50 MCG/ML IJ SOSY: 25.0000 ug | PREFILLED_SYRINGE | INTRAMUSCULAR | Status: DC | PRN

## 2022-05-15 MED ORDER — DOCUSATE SODIUM 100 MG PO CAPS
100.0000 mg | ORAL_CAPSULE | Freq: Two times a day (BID) | ORAL | Status: DC
  Administered 2022-05-16 (×2): 100 mg via ORAL
  Filled 2022-05-15 (×2): qty 1

## 2022-05-15 MED ORDER — ROCURONIUM BROMIDE 10 MG/ML (PF) SYRINGE
PREFILLED_SYRINGE | INTRAVENOUS | Status: DC | PRN
  Administered 2022-05-15: 50 mg via INTRAVENOUS

## 2022-05-15 MED ORDER — ADULT MULTIVITAMIN W/MINERALS CH
0.5000 | ORAL_TABLET | Freq: Every day | ORAL | Status: DC
  Administered 2022-05-16: 0.5 via ORAL
  Filled 2022-05-15: qty 1

## 2022-05-15 MED ORDER — ALBUTEROL SULFATE HFA 108 (90 BASE) MCG/ACT IN AERS: 2.0000 | INHALATION_SPRAY | RESPIRATORY_TRACT | Status: DC | PRN

## 2022-05-15 MED ORDER — LACTATED RINGERS IV SOLN: INTRAVENOUS | Status: DC

## 2022-05-15 MED ORDER — PANTOPRAZOLE SODIUM 40 MG PO TBEC
40.0000 mg | DELAYED_RELEASE_TABLET | Freq: Every day | ORAL | Status: DC
  Administered 2022-05-15 – 2022-05-16 (×2): 40 mg via ORAL
  Filled 2022-05-15 (×2): qty 1

## 2022-05-15 MED ORDER — CITALOPRAM HYDROBROMIDE 10 MG PO TABS
10.0000 mg | ORAL_TABLET | Freq: Every day | ORAL | Status: DC
  Administered 2022-05-16: 10 mg via ORAL
  Filled 2022-05-15: qty 1

## 2022-05-15 MED ORDER — ONDANSETRON HCL 4 MG/2ML IJ SOLN
INTRAMUSCULAR | Status: DC | PRN
  Administered 2022-05-15: 4 mg via INTRAVENOUS

## 2022-05-15 MED ORDER — PHENYLEPHRINE 80 MCG/ML (10ML) SYRINGE FOR IV PUSH (FOR BLOOD PRESSURE SUPPORT)
PREFILLED_SYRINGE | INTRAVENOUS | Status: DC | PRN
  Administered 2022-05-15 (×2): 80 ug via INTRAVENOUS
  Administered 2022-05-15 (×2): 120 ug via INTRAVENOUS
  Administered 2022-05-15 (×2): 80 ug via INTRAVENOUS

## 2022-05-15 MED ORDER — SUGAMMADEX SODIUM 200 MG/2ML IV SOLN
INTRAVENOUS | Status: DC | PRN
  Administered 2022-05-15: 200 mg via INTRAVENOUS

## 2022-05-15 MED ORDER — MELATONIN 10 MG PO CAPS: 10.0000 mg | ORAL_CAPSULE | Freq: Every evening | ORAL | Status: DC | PRN

## 2022-05-15 MED ORDER — ACETAMINOPHEN 325 MG PO TABS
650.0000 mg | ORAL_TABLET | Freq: Four times a day (QID) | ORAL | Status: DC
  Administered 2022-05-15 – 2022-05-16 (×3): 650 mg via ORAL
  Filled 2022-05-15 (×3): qty 2

## 2022-05-15 MED ORDER — ORAL CARE MOUTH RINSE: 15.0000 mL | Freq: Once | OROMUCOSAL | Status: AC

## 2022-05-15 MED ORDER — ALBUTEROL SULFATE (2.5 MG/3ML) 0.083% IN NEBU: 2.5000 mg | INHALATION_SOLUTION | RESPIRATORY_TRACT | Status: DC | PRN

## 2022-05-15 MED ORDER — DEXAMETHASONE SODIUM PHOSPHATE 10 MG/ML IJ SOLN
INTRAMUSCULAR | Status: DC | PRN
  Administered 2022-05-15: 4 mg via INTRAVENOUS

## 2022-05-15 MED ORDER — PHENYLEPHRINE 80 MCG/ML (10ML) SYRINGE FOR IV PUSH (FOR BLOOD PRESSURE SUPPORT)
PREFILLED_SYRINGE | INTRAVENOUS | Status: AC
  Filled 2022-05-15: qty 10

## 2022-05-15 MED ORDER — FENTANYL CITRATE (PF) 250 MCG/5ML IJ SOLN
INTRAMUSCULAR | Status: DC | PRN
  Administered 2022-05-15 (×2): 50 ug via INTRAVENOUS

## 2022-05-15 MED ORDER — FLUTICASONE FUROATE-VILANTEROL 200-25 MCG/ACT IN AEPB
1.0000 | INHALATION_SPRAY | Freq: Every day | RESPIRATORY_TRACT | Status: DC
  Administered 2022-05-16: 1 via RESPIRATORY_TRACT
  Filled 2022-05-15: qty 28

## 2022-05-15 MED ORDER — CEFAZOLIN SODIUM-DEXTROSE 2-4 GM/100ML-% IV SOLN
2.0000 g | INTRAVENOUS | Status: AC
  Administered 2022-05-15: 2 g via INTRAVENOUS
  Filled 2022-05-15: qty 100

## 2022-05-15 MED ORDER — DEXAMETHASONE SODIUM PHOSPHATE 10 MG/ML IJ SOLN
INTRAMUSCULAR | Status: AC
  Filled 2022-05-15: qty 1

## 2022-05-15 MED ORDER — UMECLIDINIUM BROMIDE 62.5 MCG/ACT IN AEPB
1.0000 | INHALATION_SPRAY | Freq: Every day | RESPIRATORY_TRACT | Status: DC
Start: 2022-05-16 — End: 2022-05-16
  Administered 2022-05-16: 1 via RESPIRATORY_TRACT
  Filled 2022-05-15: qty 7

## 2022-05-15 MED ORDER — TRAMADOL HCL 50 MG PO TABS: 50.0000 mg | ORAL_TABLET | Freq: Four times a day (QID) | ORAL | 0 refills | Status: AC | PRN

## 2022-05-15 MED ORDER — BUPIVACAINE-EPINEPHRINE (PF) 0.25% -1:200000 IJ SOLN
INTRAMUSCULAR | Status: AC
  Filled 2022-05-15: qty 30

## 2022-05-15 MED ORDER — BUPIVACAINE-EPINEPHRINE 0.25% -1:200000 IJ SOLN
INTRAMUSCULAR | Status: DC | PRN
  Administered 2022-05-15: 30 mL

## 2022-05-15 MED ORDER — ONDANSETRON HCL 4 MG/2ML IJ SOLN: 4.0000 mg | Freq: Once | INTRAMUSCULAR | Status: DC | PRN

## 2022-05-15 MED ORDER — ASPIRIN 81 MG PO TBEC
81.0000 mg | DELAYED_RELEASE_TABLET | Freq: Every day | ORAL | Status: DC
  Administered 2022-05-16: 81 mg via ORAL
  Filled 2022-05-15: qty 1

## 2022-05-15 MED ORDER — MAGNESIUM HYDROXIDE 400 MG/5ML PO SUSP
15.0000 mL | Freq: Every day | ORAL | Status: DC | PRN
Start: 2022-05-15 — End: 2022-05-16

## 2022-05-15 MED ORDER — FUROSEMIDE 20 MG PO TABS
20.0000 mg | ORAL_TABLET | Freq: Every day | ORAL | Status: DC
  Administered 2022-05-16: 20 mg via ORAL
  Filled 2022-05-15: qty 1

## 2022-05-15 MED ORDER — PROPOFOL 10 MG/ML IV BOLUS
INTRAVENOUS | Status: AC
  Filled 2022-05-15: qty 20

## 2022-05-15 MED ORDER — LIDOCAINE 2% (20 MG/ML) 5 ML SYRINGE
INTRAMUSCULAR | Status: DC | PRN
  Administered 2022-05-15: 80 mg via INTRAVENOUS

## 2022-05-15 MED ORDER — 0.9 % SODIUM CHLORIDE (POUR BTL) OPTIME
TOPICAL | Status: DC | PRN
  Administered 2022-05-15: 1000 mL

## 2022-05-15 MED ORDER — SODIUM CHLORIDE 0.9 % IV BOLUS: 500.0000 mL | Freq: Once | INTRAVENOUS | Status: DC

## 2022-05-15 MED ORDER — ROCURONIUM BROMIDE 10 MG/ML (PF) SYRINGE
PREFILLED_SYRINGE | INTRAVENOUS | Status: AC
  Filled 2022-05-15: qty 10

## 2022-05-15 MED ORDER — ONDANSETRON 4 MG PO TBDP: 4.0000 mg | ORAL_TABLET | Freq: Four times a day (QID) | ORAL | Status: DC | PRN

## 2022-05-15 MED ORDER — CHLORHEXIDINE GLUCONATE 0.12 % MT SOLN
15.0000 mL | Freq: Once | OROMUCOSAL | Status: AC
  Administered 2022-05-15: 15 mL via OROMUCOSAL

## 2022-05-15 MED ORDER — METOPROLOL TARTRATE 25 MG PO TABS: 25.0000 mg | ORAL_TABLET | Freq: Two times a day (BID) | ORAL | Status: DC

## 2022-05-15 MED ORDER — ATORVASTATIN CALCIUM 20 MG PO TABS
20.0000 mg | ORAL_TABLET | Freq: Every day | ORAL | Status: DC
  Administered 2022-05-15 – 2022-05-16 (×2): 20 mg via ORAL
  Filled 2022-05-15 (×2): qty 1

## 2022-05-15 MED ORDER — FENTANYL CITRATE (PF) 100 MCG/2ML IJ SOLN
INTRAMUSCULAR | Status: AC
  Filled 2022-05-15: qty 2

## 2022-05-15 MED ORDER — PREDNISONE 5 MG PO TABS
5.0000 mg | ORAL_TABLET | Freq: Every day | ORAL | Status: DC
  Administered 2022-05-16: 5 mg via ORAL
  Filled 2022-05-15: qty 1

## 2022-05-15 MED ORDER — MELATONIN 5 MG PO TABS
10.0000 mg | ORAL_TABLET | Freq: Every evening | ORAL | Status: DC | PRN
Start: 2022-05-15 — End: 2022-05-16

## 2022-05-15 SURGICAL SUPPLY — 45 items
APPLIER CLIP 5 13 M/L LIGAMAX5 (MISCELLANEOUS) ×1
BAG COUNTER SPONGE SURGICOUNT (BAG) IMPLANT
CHLORAPREP W/TINT 26 (MISCELLANEOUS) ×1 IMPLANT
CLIP APPLIE 5 13 M/L LIGAMAX5 (MISCELLANEOUS) ×1 IMPLANT
COVER MAYO STAND XLG (MISCELLANEOUS) ×1 IMPLANT
COVER SURGICAL LIGHT HANDLE (MISCELLANEOUS) ×1 IMPLANT
DERMABOND ADVANCED (GAUZE/BANDAGES/DRESSINGS) ×1
DERMABOND ADVANCED .7 DNX12 (GAUZE/BANDAGES/DRESSINGS) ×1 IMPLANT
DRAPE C-ARM 42X120 X-RAY (DRAPES) IMPLANT
ELECT L-HOOK LAP 45CM DISP (ELECTROSURGICAL)
ELECT PENCIL ROCKER SW 15FT (MISCELLANEOUS) ×1 IMPLANT
ELECT REM PT RETURN 15FT ADLT (MISCELLANEOUS) ×1 IMPLANT
ELECTRODE L-HOOK LAP 45CM DISP (ELECTROSURGICAL) IMPLANT
GLOVE BIOGEL PI IND STRL 6 (GLOVE) ×1 IMPLANT
GLOVE BIOGEL PI INDICATOR 6 (GLOVE) ×1
GLOVE BIOGEL PI MICRO 5.5 (GLOVE) ×1
GLOVE BIOGEL PI MICRO STRL 5.5 (GLOVE) ×1 IMPLANT
GOWN STRL REUS W/ TWL LRG LVL3 (GOWN DISPOSABLE) ×1 IMPLANT
GOWN STRL REUS W/TWL LRG LVL3 (GOWN DISPOSABLE) ×1
GRASPER SUT TROCAR 14GX15 (MISCELLANEOUS) IMPLANT
HEMOSTAT SNOW SURGICEL 2X4 (HEMOSTASIS) IMPLANT
IRRIG SUCT STRYKERFLOW 2 WTIP (MISCELLANEOUS) ×1
IRRIGATION SUCT STRKRFLW 2 WTP (MISCELLANEOUS) ×1 IMPLANT
KIT BASIN OR (CUSTOM PROCEDURE TRAY) ×1 IMPLANT
KIT TURNOVER KIT A (KITS) IMPLANT
L-HOOK LAP DISP 36CM (ELECTROSURGICAL) ×1
LHOOK LAP DISP 36CM (ELECTROSURGICAL) ×1 IMPLANT
NDL INSUFFLATION 14GA 120MM (NEEDLE) IMPLANT
NEEDLE INSUFFLATION 14GA 120MM (NEEDLE) IMPLANT
POUCH RETRIEVAL ECOSAC 10 (ENDOMECHANICALS) IMPLANT
POUCH RETRIEVAL ECOSAC 10MM (ENDOMECHANICALS) ×1
SCISSORS LAP 5X35 DISP (ENDOMECHANICALS) ×1 IMPLANT
SET CHOLANGIOGRAPH MIX (MISCELLANEOUS) IMPLANT
SET TUBE SMOKE EVAC HIGH FLOW (TUBING) ×1 IMPLANT
SLEEVE Z-THREAD 5X100MM (TROCAR) ×2 IMPLANT
SPIKE FLUID TRANSFER (MISCELLANEOUS) ×1 IMPLANT
SUT MNCRL AB 4-0 PS2 18 (SUTURE) ×1 IMPLANT
SYS BAG RETRIEVAL 10MM (BASKET)
SYSTEM BAG RETRIEVAL 10MM (BASKET) IMPLANT
TOWEL OR 17X26 10 PK STRL BLUE (TOWEL DISPOSABLE) ×1 IMPLANT
TOWEL OR NON WOVEN STRL DISP B (DISPOSABLE) IMPLANT
TRAY LAPAROSCOPIC (CUSTOM PROCEDURE TRAY) ×1 IMPLANT
TROCAR ADV FIXATION 12X100MM (TROCAR) IMPLANT
TROCAR BALLN 12MMX100 BLUNT (TROCAR) IMPLANT
TROCAR Z-THREAD OPTICAL 5X100M (TROCAR) ×1 IMPLANT

## 2022-05-15 NOTE — Anesthesia Postprocedure Evaluation (Signed)
Anesthesia Post Note  Patient: Tony [name]  Procedure(s) Performed: [place]     Patient location during evaluation: PACU Anesthesia Type: General Level of consciousness: awake and alert Pain management: pain level controlled Vital Signs Assessment: post-procedure vital signs reviewed and stable Respiratory status: spontaneous breathing, nonlabored ventilation, respiratory function stable and patient connected to nasal cannula oxygen Cardiovascular status: blood pressure returned to baseline and stable Postop Assessment: no apparent nausea or vomiting Anesthetic complications: no   No notable events documented.  Last Vitals:  Vitals:   05/15/22 1411 05/15/22 1513  BP: 104/81 (!) 84/66  Pulse: 69 (!) 59  Resp:    Temp: (!) 36.4 C (!) 36.3 C  SpO2: 92% 94%    Last Pain:  Vitals:   05/15/22 1513  TempSrc: Oral  PainSc:                  [name]

## 2022-05-15 NOTE — Progress Notes (Signed)
Patient prefers wearing 2L [place] to bed instead of wearing cpap. Encouraged pt to call if he changes his mind

## 2022-05-15 NOTE — Anesthesia Procedure Notes (Signed)
Procedure Name: Intubation Date/Time: 05/15/2022 7:37 AM  Performed by: Eben Burow, CRNAPre-anesthesia Checklist: Patient identified, Emergency Drugs available, Suction available, Patient being monitored and Timeout performed Patient Re-evaluated:Patient Re-evaluated prior to induction Oxygen Delivery Method: Circle system utilized Preoxygenation: Pre-oxygenation with 100% oxygen Induction Type: IV induction Ventilation: Mask ventilation without difficulty Laryngoscope Size: Mac and 4 Grade View: Grade I Tube type: Oral Tube size: 7.5 mm Number of attempts: 1 Airway Equipment and Method: Stylet Placement Confirmation: ETT inserted through vocal cords under direct vision, positive ETCO2 and breath sounds checked- equal and bilateral Secured at: 23 cm Tube secured with: Tape Dental Injury: Teeth and Oropharynx as per pre-operative assessment

## 2022-05-15 NOTE — Progress Notes (Signed)
A magnet was placed on the patient's pacemaker in surgery. Patient's pacemaker is currently pacing. [employer] number [phone] was called. The representative was paged by [employer]. Waiting for a call back. Will continue to monitor patient and patient's heart rate and rhythm.

## 2022-05-15 NOTE — Discharge Instructions (Addendum)
[facility] DISCHARGE INSTRUCTIONS  Activity No heavy lifting greater than 15 pounds for 4 weeks after surgery. Ok to shower in 24 hours, but do not bathe or submerge incisions underwater. Do not drive while taking narcotic pain medication.  Wound Care Your incisions are covered with skin glue called Dermabond. This will peel off on its own over time. You may shower and allow warm soapy water to run over your incisions. Gently pat dry. Do not submerge your incision underwater. Monitor your incision for any new redness, tenderness, or drainage.  When to Call us: Fever greater than 100.5 New redness, drainage, or swelling at incision site Severe pain, nausea, or vomiting Jaundice (yellowing of the whites of the eyes or skin)  Follow-up You have an appointment scheduled with Dr. Zenia Resides on June 02, 2022 at 9:30am. This will be at the [facility] office at [address], [place], [place], [place]. Please arrive at least 15 minutes prior to your scheduled appointment time.  For questions or concerns, please call the office at [phone].

## 2022-05-15 NOTE — Progress Notes (Signed)
Spoke with the [employer] representative [place], Patient's pace maker is actively working and nothing further has to be done. Will continue to monitor patient.

## 2022-05-15 NOTE — Op Note (Signed)
Date: 05/15/22  Patient: Tony [name] MRN: [id number]  Preoperative Diagnosis: Cholelithiasis, recurrent cholangitis Postoperative Diagnosis: Same  Procedure: Laparoscopic cholecystectomy  Surgeon: Michaelle Birks, MD Assistant: Toula Moos, MD (Resident)  EBL: 30 mL  Anesthesia: General endotracheal  Specimens: Gallbladder  Indications: Tony [name] is a 81 yo male who has had multiple admissions over the last several months with elevated LFTs and E coli bacteremia. RUQ [place] and MRCP confirmed gallstones, but no choledocholithiasis or biliary obstruction. During each admission, his LFTs rapidly improved spontaneously and abdominal pain resolved. He was referred to me for elective cholecystomy. His bacteremia and abnormal LFTs are presumably secondary to transient choledocholithiasis leading to cholangitis, thus after a discussion of the risks and benefits of surgery, he agreed to proceed with cholecystectomy. He has been evaluated by his cardiologist and pulmonologist for preoperative risk stratification.  Findings: No evidence of acute cholecystitis.  Procedure details: Informed consent was obtained in the preoperative area prior to the procedure. The patient was brought to the operating room and placed on the table in the supine position. General anesthesia was induced and appropriate lines and drains were placed for intraoperative monitoring. Perioperative antibiotics were administered per SCIP guidelines. The abdomen was prepped and draped in the usual sterile fashion. A pre-procedure timeout was taken verifying patient identity, surgical site and procedure to be performed.  A small infraumbilical skin incision was made, the subcutaneous tissue was divided with cautery, and the umbilical stalk was grasped and elevated. The fascia was incised and the peritoneal cavity was directly visualized. A 43m Hassan trocar was placed and the abdomen was insufflated. The peritoneal cavity was  inspected with no evidence of visceral or vascular injury. Three 54mports were placed in the right subcostal margin, all under direct visualization. The fundus of the gallbladder was grasped and retracted cephalad. There were omental adhesions to the gallbladder, which were taken down using gentle blunt dissection and cautery. The infundibulum was retracted laterally. The cystic triangle was dissected out using cautery and blunt dissection, and the critical view of safety was obtained. The cystic duct and cystic artery were clipped and ligated, leaving two clips behind on the cystic duct stump. The gallbladder was taken off the liver using cautery. A posterior branch of the cystic artery was clipped to maintain hemostasis. The specimen was placed in an Ecosac. The surgical site was irrigated with saline until the effluent was clear. Hemostasis was achieved in the gallbladder fossa using cautery. The cystic duct and artery stumps were visually inspected and there was no evidence of bile leak or bleeding. The ports were removed under direct visualization and the abdomen was desufflated. The specimen was extracted via the umbilical port site. The umbilical port site fascia was closed with a 0 vicryl suture. The skin at all port sites was closed with 4-0 monocryl subcuticular suture. Dermabond was applied.  The patient tolerated the procedure well with no apparent complications. All counts were correct x2 at the end of the procedure. The patient was extubated and taken to PACU in stable condition.  ShMichaelle BirksMD 05/15/22 10:17 AM

## 2022-05-15 NOTE — H&P (Signed)
Tony [name] is an 81 y.o. male.   Chief Complaint: gallstones HPI: Tony [name] is a 81 y.o. male who was referred to me for evaluation of gallstones. He was recently admitted in mid-April with sepsis and COVID pneumonia, and during that admission was noted to have a transient elevation in transaminases and bilirubin (max bili of 6, AST/ALT 1288/643). He also had E coli bacteremia. GI was consulted and the patient had a RUQ [place] and MRCP, both of which showed gallstones but no signs of biliary obstruction or acute cholecystitis. His LFTs rapidly improved within a few days and on most recent labs 5/5 were completely normal. The patient recently followed up with Dr. Watt Climes in GI clinic and was referred to me to evaluate for cholecystectomy.  Of note, he has previously had 2 episodes of jaundice with elevated LFTs. He was admitted in January with respiratory symptoms, at which time he also reports he had RUQ abdominal pain. LFTs were similarly elevated at that time and spontaneously improved. [place] at that time did not show biliary ductal dilation or signs of acute cholecystitis. Prior to that, he also elevated LFTs November 2021, at which time he also had E. coli bacteremia.  His only other prior abdominal surgery is a lap appendectomy in 2019 by Dr. Kieth Brightly. He has a pacemaker for sick sinus syndrome, and also has COPD and is on 3L oxygen at night only. He was seen for surgery consultation on 5/30 and has since received preop cardiology and pulmonology risk stratification. He is here today for surgery. He denies abdominal pain today.  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   Radiation    Sinus problem    SSS (sick sinus syndrome) ([place])     Past Surgical History:  Procedure Laterality Date   APPENDECTOMY  2019   hx hematoma s/p appendectomy   ESOPHAGOGASTRODUODENOSCOPY (EGD) WITH PROPOFOL N/A 02/03/2018   Procedure: ESOPHAGOGASTRODUODENOSCOPY (EGD) WITH PROPOFOL;  Surgeon: Clarene Essex, MD;  Location: [facility];  Service: Endoscopy;  Laterality: N/A;   FRACTURE SURGERY     INSERT / REPLACE / REMOVE PACEMAKER  06/14/2018   LAPAROSCOPIC APPENDECTOMY N/A 01/21/2018   Procedure: APPENDECTOMY LAPAROSCOPIC;  Surgeon: Kinsinger, Arta Bruce, MD;  Location: [facility];  Service: General;  Laterality: N/A;   PACEMAKER IMPLANT N/A 06/14/2018   Procedure: PACEMAKER IMPLANT - Dual Chamber;  Surgeon: Sanda Klein, MD;  Location: [facility];  Service: Cardiovascular;  Laterality: N/A;   PROSTATE BIOPSY  02/08/14   gleason 4+5=9, 12/12 cores positive, 54 gm   RADIOACTIVE SEED IMPLANT N/A 06/29/2014   Procedure: RADIOACTIVE SEED IMPLANT;  Surgeon: Bernestine Amass, MD;  Location: [facility];  Service: Urology;  Laterality: N/A;   WRIST FRACTURE SURGERY Right 1980s    Family History  Problem Relation Age of Onset   Alzheimer's disease Mother    Alzheimer's disease Father    Cancer Father        prostate   Arthritis Sister    Cancer Brother  prostate   Social History:  reports that he quit smoking about 9 years ago. His smoking use included cigars. He has never used smokeless tobacco. He reports that he does not currently use alcohol after a past usage of about 14.0 standard drinks of alcohol per week. He reports that he does not use drugs.  Allergies:  Allergies  Allergen Reactions   Penicillins Rash    Has patient had a PCN reaction causing immediate rash, facial/tongue/throat swelling, SOB or lightheadedness with hypotension: Yes Has patient had a PCN reaction causing  severe rash involving mucus membranes or skin necrosis: No Has patient had a PCN reaction that required hospitalization: No Has patient had a PCN reaction occurring within the last 10 years: No If all of the above answers are "NO", then may proceed with Cephalosporin use.    Medications Prior to Admission  Medication Sig Dispense Refill   abiraterone acetate (ZYTIGA) 250 MG tablet TAKE 4 TABLETS (1,000 MG TOTAL) BY MOUTH DAILY. TAKE ON AN EMPTY STOMACH 1 HOUR BEFORE OR 2 HOURS AFTER A MEAL 120 tablet 2   aspirin EC 81 MG tablet Take 81 mg by mouth daily.     atorvastatin (LIPITOR) 20 MG tablet Take 1 tablet (20 mg total) by mouth daily. 90 tablet 1   benzonatate (TESSALON) 100 MG capsule Take 1 capsule (100 mg total) by mouth daily as needed. 30 capsule 1   budesonide-formoterol (SYMBICORT) 160-4.5 MCG/ACT inhaler Inhale 2 puffs then rinse mouth, twice daily (Patient taking differently: Inhale 2 puffs into the lungs 2 (two) times daily. Inhale 2 puffs then rinse mouth, twice daily) 1 each 12   citalopram (CELEXA) 10 MG tablet Take 1 tablet (10 mg total) by mouth daily. 90 tablet 3   diclofenac Sodium (VOLTAREN) 1 % GEL AAPLY 4 GRAMS TOPICALLY 4 TIMES DAILY (Patient taking differently: Apply 1 Application topically 4 (four) times daily as needed (pain).) 400 g 2   furosemide (LASIX) 20 MG tablet Take 1 tablet (20 mg total) by mouth daily. 90 tablet 1   LORazepam (ATIVAN) 1 MG tablet Take 0.5-1 tablets (0.5-1 mg total) by mouth 2 (two) times daily as needed for anxiety. 60 tablet 0   magnesium hydroxide (MILK OF MAGNESIA) 400 MG/5ML suspension Take by mouth daily as needed for mild constipation.     Melatonin 10 MG CAPS Take 20 mg by mouth at bedtime.     metoprolol tartrate (LOPRESSOR) 25 MG tablet Take 1 tablet (25 mg total) by mouth 2 (two) times daily. 180 tablet 2   Multiple Vitamin (MULTIVITAMIN WITH MINERALS) TABS tablet Take 0.5 tablets by mouth daily.     omeprazole (PRILOSEC) 20 MG  capsule TAKE 1 CAPSULE BY MOUTH DAILY BEFORE BREAKFAST. 90 capsule 3   predniSONE (DELTASONE) 5 MG tablet Take 1 tablet (5 mg total) by mouth daily with breakfast. 90 tablet 3   Tiotropium Bromide Monohydrate (SPIRIVA RESPIMAT) 2.5 MCG/ACT AERS Inhale 2 puffs into the lungs daily. 4 g 0   acetaminophen (TYLENOL) 500 MG tablet Take 1,000 mg by mouth every 6 (six) hours as needed for headache or moderate pain (pain).     albuterol (VENTOLIN HFA) 108 (90 Base) MCG/ACT inhaler TAKE 2 PUFFS BY MOUTH EVERY 6 HOURS AS NEEDED FOR WHEEZE OR SHORTNESS OF BREATH 18 each 12   fluticasone (FLONASE) 50 MCG/ACT nasal spray PLACE 1 SPRAY INTO BOTH NOSTRILS DAILY AS NEEDED FOR ALLERGIES OR RHINITIS (AT BEDTIME). 48 mL 1    No results found  for this or any previous visit (from the past 48 hour(s)). No results found.  Review of Systems  Blood pressure 128/79, pulse 77, temperature (!) 97.5 F (36.4 C), temperature source Oral, resp. rate 18, SpO2 93 %. Physical Exam Vitals reviewed.  Constitutional:      General: He is not in acute distress.    Appearance: Normal appearance.  HENT:     Head: Normocephalic and atraumatic.  Eyes:     General: No scleral icterus.    Conjunctiva/sclera: Conjunctivae normal.  Pulmonary:     Effort: Pulmonary effort is normal. No respiratory distress.  Abdominal:     General: There is no distension.     Palpations: Abdomen is soft.     Tenderness: There is no abdominal tenderness.  Musculoskeletal:        General: Normal range of motion.     Cervical back: Normal range of motion.  Skin:    General: Skin is warm and dry.     Coloration: Skin is not jaundiced.  Neurological:     Mental Status: He is alert.      Assessment/Plan 81 yo male with gallstones and a history of recurrent cholangitis. Proceed to OR for laparoscopic cholecystectomy. Informed consent obtained. Possible discharge home postoperatively, vs overnight observation given comorbidities, will assess  postoperatively.  [name], MD 05/15/2022, 7:24 AM

## 2022-05-15 NOTE — Transfer of Care (Signed)
Immediate Anesthesia Transfer of Care Note  Patient: Tony [name]  Procedure(s) Performed: LAPAROSCOPIC CHOLECYSTECTOMY  Patient Location: PACU  Anesthesia Type:General  Level of Consciousness: awake, alert  and patient cooperative  Airway & Oxygen Therapy: Patient Spontanous Breathing and Patient connected to face mask oxygen  Post-op Assessment: Report given to RN and Post -op Vital signs reviewed and stable  Post vital signs: Reviewed and stable  Last Vitals:  Vitals Value Taken Time  BP    Temp    Pulse    Resp    SpO2      Last Pain:  Vitals:   05/15/22 0628  TempSrc:   PainSc: 0-No pain         Complications: No notable events documented.

## 2022-05-15 NOTE — Telephone Encounter (Signed)
-  Caller states she's from [place] wanting to clarify an order.--When the prescription was sent over it was attached to someone else's profile, 07/25/1944 ? Lannette Donath? from [facility] Will need to resend the prescription when the office opens tomorrow 05/14/2022 with the correct pt information. This medication is currently not in stock; on backorder at her pharmacy. This will need to send to another pharmacy where Rx is in stock. States wife has called a few times trying to pick up med.  Of note: pt is currently at [facility] for surgery.

## 2022-05-16 ENCOUNTER — Encounter (HOSPITAL_COMMUNITY): Payer: Self-pay | Admitting: Surgery

## 2022-05-16 DIAGNOSIS — I10 Essential (primary) hypertension: Secondary | ICD-10-CM | POA: Diagnosis not present

## 2022-05-16 DIAGNOSIS — Z950 Presence of cardiac pacemaker: Secondary | ICD-10-CM | POA: Diagnosis not present

## 2022-05-16 DIAGNOSIS — Z8619 Personal history of other infectious and parasitic diseases: Secondary | ICD-10-CM | POA: Diagnosis not present

## 2022-05-16 DIAGNOSIS — Z8616 Personal history of COVID-19: Secondary | ICD-10-CM | POA: Diagnosis not present

## 2022-05-16 DIAGNOSIS — J449 Chronic obstructive pulmonary disease, unspecified: Secondary | ICD-10-CM | POA: Diagnosis not present

## 2022-05-16 DIAGNOSIS — K8010 Calculus of gallbladder with chronic cholecystitis without obstruction: Secondary | ICD-10-CM | POA: Diagnosis not present

## 2022-05-16 DIAGNOSIS — I495 Sick sinus syndrome: Secondary | ICD-10-CM | POA: Diagnosis not present

## 2022-05-16 DIAGNOSIS — G4730 Sleep apnea, unspecified: Secondary | ICD-10-CM | POA: Diagnosis not present

## 2022-05-16 DIAGNOSIS — Z87891 Personal history of nicotine dependence: Secondary | ICD-10-CM | POA: Diagnosis not present

## 2022-05-16 DIAGNOSIS — K219 Gastro-esophageal reflux disease without esophagitis: Secondary | ICD-10-CM | POA: Diagnosis not present

## 2022-05-16 DIAGNOSIS — I2510 Atherosclerotic heart disease of native coronary artery without angina pectoris: Secondary | ICD-10-CM | POA: Diagnosis not present

## 2022-05-16 DIAGNOSIS — Z9981 Dependence on supplemental oxygen: Secondary | ICD-10-CM | POA: Diagnosis not present

## 2022-05-16 NOTE — Progress Notes (Signed)
Assessment unchanged. Pt and wife verbalized understanding of dc instructions through teach back including medications, follow up care and when to call the doctor. Discharged via wc to front entrance accompanied by nurse and wife and daughter.

## 2022-05-16 NOTE — Progress Notes (Signed)
1 Day Post-Op   Subjective/Chief Complaint: No complaints other than some soreness   Objective: Vital signs in last 24 hours: Temp:  [97.4 F (36.3 C)-97.9 F (36.6 C)] 97.4 F (36.3 C) (09/02 0921) Pulse Rate:  [59-77] 68 (09/02 0921) Resp:  [16-18] 18 (09/02 0921) BP: (84-145)/(62-91) 129/79 (09/02 0921) SpO2:  [91 %-97 %] 95 % (09/02 0921)    Intake/Output from previous day: 09/01 0701 - 09/02 0700 In: 1173 [P.O.:60; I.V.:1013; IV Piggyback:100] Out: 50 [Blood:50] Intake/Output this shift: No intake/output data recorded.  General appearance: alert and cooperative Resp: clear to auscultation bilaterally Cardio: regular rate and rhythm GI: soft, mild tenderness  Lab Results:  Recent Labs    05/15/22 1548  WBC 6.8  HGB 12.9*  HCT 39.2  PLT 191   BMET Recent Labs    05/15/22 1322  CREATININE 1.16   PT/INR No results for input(s): "LABPROT", "INR" in the last 72 hours. ABG No results for input(s): "PHART", "HCO3" in the last 72 hours.  Invalid input(s): "PCO2", "PO2"  Studies/Results: No results found.  Anti-infectives: Anti-infectives (From admission, onward)    Start     Dose/Rate Route Frequency Ordered Stop   05/15/22 0630  ceFAZolin (ANCEF) IVPB 2g/100 mL premix        2 g 200 mL/hr over 30 Minutes Intravenous On call to O.R. 05/15/22 0618 05/15/22 0748       Assessment/Plan: s/p Procedure(s): LAPAROSCOPIC CHOLECYSTECTOMY (N/A) Advance diet Discharge  LOS: 0 days    Autumn Messing III 05/16/2022

## 2022-05-16 NOTE — Plan of Care (Signed)

## 2022-05-19 ENCOUNTER — Telehealth: Payer: Self-pay

## 2022-05-19 ENCOUNTER — Other Ambulatory Visit: Payer: Self-pay | Admitting: Family Medicine

## 2022-05-19 DIAGNOSIS — F064 Anxiety disorder due to known physiological condition: Secondary | ICD-10-CM

## 2022-05-19 LAB — SURGICAL PATHOLOGY

## 2022-05-19 MED ORDER — ALPRAZOLAM 1 MG PO TABS: 0.5000 mg | ORAL_TABLET | Freq: Two times a day (BID) | ORAL | 0 refills | Status: DC | PRN

## 2022-05-19 NOTE — Telephone Encounter (Signed)
I left patient's wife a voicemail letting her know a new prescription was sent in.

## 2022-05-19 NOTE — Telephone Encounter (Signed)
PCP will change medication.

## 2022-05-19 NOTE — Telephone Encounter (Signed)
See other phone note

## 2022-05-19 NOTE — Telephone Encounter (Signed)
Has he gotten his Lorazepam yet? Has he checked with [place] [facility]? There is another phone call requesting sending refill to mail pharmacy. Thanks, BJ

## 2022-05-19 NOTE — Telephone Encounter (Signed)
Rx for Alprazolam sent to replace Lorazepam. Thanks, BJ

## 2022-05-19 NOTE — Telephone Encounter (Signed)
--  Caller states her husband is out of his med. Caller states no pharmacy has it in [place]. Caller states he needs to get different rx called in. Caller states the medication is Lorazepam. Caller states he just came home from the hospital Saturday from gallbladder surgery. Caller denies any new or worsening symptoms at this time.  05/18/2022 9:30:47 AM Clinical Call Burnadette Peter, RN, [place]  Comments User: Braxton Feathers, RN Date/Time Eilene Ghazi Time): 05/18/2022 9:30:35 AM Caller advised to call back if her husband develops any new or worsening symptoms. Caller verbalized understanding.  05/19/22 1044 - Wife states that [facility] also does not have Lorazepam that was refilled on 05/13/22 (b/c [facility] didn't have it at that time). Pt advised to let us know of a pharmacy that has it; states she called around & has not been able to find a pharmacy with the drug in stock. She is asking if anything different can be prescribed. Will send to PCP.

## 2022-05-19 NOTE — Telephone Encounter (Signed)
See other telephone note.  

## 2022-05-26 ENCOUNTER — Telehealth: Payer: Self-pay | Admitting: Pharmacist

## 2022-05-26 NOTE — Chronic Care Management (AMB) (Signed)
    Chronic Care Management Pharmacy Assistant   Name: Tony [name] [facility]  MRN: [id number] DOB: [birth year]  05/26/22 APPOINTMENT REMINDER  Patient was reminded to have all medications, supplements and any blood glucose and blood pressure readings available for review with Jeni Salles, Pharm. D, for telephone visit on 05/27/22 at 9:30.   Care Gaps: Flu Vaccine - Overdue COVID Vaccine - Postponed Zoster Vaccine - Postponed BP- 120/70 05/12/22 AWV- 11/22  Star Rating Drug: Atorvastatin 20 mg - last filled 04/24/2022 90 DS at [facility]    Medications: Outpatient Encounter Medications as of 05/26/2022  Medication Sig   abiraterone acetate (ZYTIGA) 250 MG tablet TAKE 4 TABLETS (1,000 MG TOTAL) BY MOUTH DAILY. TAKE ON AN EMPTY STOMACH 1 HOUR BEFORE OR 2 HOURS AFTER A MEAL   acetaminophen (TYLENOL) 500 MG tablet Take 1,000 mg by mouth every 6 (six) hours as needed for headache or moderate pain (pain).   albuterol (VENTOLIN HFA) 108 (90 Base) MCG/ACT inhaler TAKE 2 PUFFS BY MOUTH EVERY 6 HOURS AS NEEDED FOR WHEEZE OR SHORTNESS OF BREATH   ALPRAZolam (XANAX) 1 MG tablet Take 0.5-1 tablets (0.5-1 mg total) by mouth 2 (two) times daily as needed for anxiety.   aspirin EC 81 MG tablet Take 81 mg by mouth daily.   atorvastatin (LIPITOR) 20 MG tablet Take 1 tablet (20 mg total) by mouth daily.   benzonatate (TESSALON) 100 MG capsule Take 1 capsule (100 mg total) by mouth daily as needed.   budesonide-formoterol (SYMBICORT) 160-4.5 MCG/ACT inhaler Inhale 2 puffs then rinse mouth, twice daily (Patient taking differently: Inhale 2 puffs into the lungs 2 (two) times daily. Inhale 2 puffs then rinse mouth, twice daily)   citalopram (CELEXA) 10 MG tablet Take 1 tablet (10 mg total) by mouth daily.   diclofenac Sodium (VOLTAREN) 1 % GEL AAPLY 4 GRAMS TOPICALLY 4 TIMES DAILY (Patient taking differently: Apply 1 Application topically 4 (four) times daily as needed (pain).)   fluticasone (FLONASE) 50 MCG/ACT  nasal spray PLACE 1 SPRAY INTO BOTH NOSTRILS DAILY AS NEEDED FOR ALLERGIES OR RHINITIS (AT BEDTIME).   furosemide (LASIX) 20 MG tablet Take 1 tablet (20 mg total) by mouth daily.   magnesium hydroxide (MILK OF MAGNESIA) 400 MG/5ML suspension Take by mouth daily as needed for mild constipation.   Melatonin 10 MG CAPS Take 20 mg by mouth at bedtime.   metoprolol tartrate (LOPRESSOR) 25 MG tablet Take 1 tablet (25 mg total) by mouth 2 (two) times daily.   Multiple Vitamin (MULTIVITAMIN WITH MINERALS) TABS tablet Take 0.5 tablets by mouth daily.   omeprazole (PRILOSEC) 20 MG capsule TAKE 1 CAPSULE BY MOUTH DAILY BEFORE BREAKFAST.   predniSONE (DELTASONE) 5 MG tablet Take 1 tablet (5 mg total) by mouth daily with breakfast.   Tiotropium Bromide Monohydrate (SPIRIVA RESPIMAT) 2.5 MCG/ACT AERS Inhale 2 puffs into the lungs daily.   No facility-administered encounter medications on file as of 05/26/2022.      [place] Clinical Pharmacist Assistant [phone]

## 2022-05-26 NOTE — Progress Notes (Signed)
Chronic Care Management Pharmacy Note  05/27/2022 Name:  Tony [name] [facility] MRN:  [id number] DOB:  04-26-1941  Summary: Patient and wife are very confused with inhalers LDL not at goal < 70   Recommendations/Changes made from today's visit: -Recommend repeat PFTs -Consider switching to triple inhaler combo after trial of Spiriva -Recommend repeat lipid panel   Plan: COPD assessment in 1-2 weeks Follow up pending change in inhaler  Subjective: Tony [name] is an 81 y.o. year old male who is a primary patient of [name], [name], MD.  The CCM team was consulted for assistance with disease management and care coordination needs.    Engaged with patient by telephone for follow up visit in response to provider referral for pharmacy case management and/or care coordination services.   Consent to Services:  The patient was given information about Chronic Care Management services, agreed to services, and gave verbal consent prior to initiation of services.  Please see initial visit note for detailed documentation.   Patient Care Team: [name], [name], MD as PCP - General (Family Medicine) Sanda Klein, MD as PCP - Cardiology (Cardiology) Wyatt Portela, MD as Consulting Physician (Oncology) Viona Gilmore, [facility] as Pharmacist (Pharmacist)  Recent office visits: 05/12/22 [name], MD: Patient presented for chronic conditions follow up. Prescribed Spiriva. Follow up in 6 months.  01/14/2022 [name] MD - Patient was seen for Chronic respiratory failure with hypoxia and additional issues. Referral to Home Health. Started Benzonatate 200 mg daily as needed. Follow up in 3 months.    12/16/2021 [name] MD - Patient was seen for essential hypertension and additional issues. No medications changes. Follow up in 6 months.  Recent consult visits: 04/17/22 Zola Button, MD (oncology): Patient presented for prostate neoplasm follow up. Administered leuprolide injection.  Repeat in 3 months.  04/13/22 Sanda Klein, MD (cardiology): Patient presented for SSS follow up and pacemaker check. D/C'd amlodipine.   02/16/22 Baird Lyons, MD (pulmonary): Patient presented for COPD follow up. Follow up in 6 months.  02/10/22  Michaelle Birks, MD (surgery): Patient presented for initial visit for gallbladder surgery consult.   01/28/22 Tony Raid, MD (cardiac and thoracic surgery): Patient presented for thoracic aortic aneurysm follow up. Follow up PRN.  01/16/2022 Zola Button MD (oncology) - Patient was seen for morbid obesity and an additional issue. No medication changes. Follow up in 3 months.    12/16/2021 Roma Kayser RN [facility]) - Patient was seen for SSS (sick sinus syndrome). No medication changes. No follow up noted.   Hospital visits: 05/15/22 Patient was admitted to [facility] for symptomatic cholelithiasis and for laparoscopic cholecystectomy.   Objective:  Lab Results  Component Value Date   CREATININE 1.16 05/15/2022   BUN 16 04/17/2022   GFR 78.21 02/18/2018   GFRNONAA >60 05/15/2022   GFRAA 77 07/31/2020   NA 137 04/17/2022   K 4.7 04/17/2022   CALCIUM 9.0 04/17/2022   CO2 27 04/17/2022    Lab Results  Component Value Date/Time   HGBA1C 6.0 05/12/2022 10:13 AM   HGBA1C 5.9 (H) 12/26/2021 12:47 AM   HGBA1C 6.1 06/23/2021 11:32 AM   GFR 78.21 02/18/2018 09:01 AM   GFR 93.27 01/21/2018 10:12 AM    Last diabetic Eye exam: No results found for: "HMDIABEYEEXA"  Last diabetic Foot exam: No results found for: "HMDIABFOOTEX"   Lab Results  Component Value Date   CHOL 191 12/30/2021   HDL 55 12/30/2021   LDLCALC 121 (H)  12/30/2021   TRIG 76 12/30/2021   CHOLHDL 3.5 12/30/2021       Latest Ref Rng & Units 04/17/2022   10:06 AM 01/16/2022   11:30 AM 01/01/2022   12:32 AM  Hepatic Function  Total Protein 6.5 - 8.1 g/dL 6.7  6.7  5.6   Albumin 3.5 - 5.0 g/dL 4.1  3.7  2.9   AST 15 - 41 U/L '18  16  30   ' ALT 0 - 44 U/L 15  17   70   Alk Phosphatase 38 - 126 U/L 84  80  70   Total Bilirubin 0.3 - 1.2 mg/dL 0.9  0.8  0.7     Lab Results  Component Value Date/Time   TSH 1.840 05/15/2021 09:27 AM   TSH 0.76 05/13/2016 11:55 AM       Latest Ref Rng & Units 05/15/2022    3:48 PM 04/17/2022   10:06 AM 01/16/2022   11:30 AM  CBC  WBC 4.0 - 10.5 K/uL 6.8  9.1  7.5   Hemoglobin 13.0 - 17.0 g/dL 12.9  13.9  13.1   Hematocrit 39.0 - 52.0 % 39.2  41.4  39.6   Platelets 150 - 400 K/uL 191  204  170     No results found for: "VD25OH"  Clinical ASCVD: Yes  The 10-year ASCVD risk score (Arnett DK, et al., 2019) is: 34.8%   Values used to calculate the score:     Age: 81 years     Sex: Male     Is Non-Hispanic African American: No     Diabetic: No     Tobacco smoker: No     Systolic Blood Pressure: 333 mmHg     Is BP treated: Yes     HDL Cholesterol: 55 mg/dL     Total Cholesterol: 191 mg/dL       05/12/2022    9:30 AM 01/14/2022    2:09 PM 10/27/2021    9:37 AM  Depression screen PHQ 2/9  Decreased Interest 0 0 0  Down, Depressed, Hopeless 0 0 0  PHQ - 2 Score 0 0 0  Altered sleeping 2  0  Tired, decreased energy 2  2  Change in appetite 0  0  Feeling bad or failure about yourself  0  0  Trouble concentrating 0  0  Moving slowly or fidgety/restless 1  0  Suicidal thoughts 0  0  PHQ-9 Score 5  2  Difficult doing work/chores Not difficult at all  Not difficult at all      Social History   Tobacco Use  Smoking Status Former   Types: Cigars   Quit date: 09/14/2012   Years since quitting: 9.7  Smokeless Tobacco Never  Tobacco Comments   little cigars; the small ones"   BP Readings from Last 3 Encounters:  05/16/22 129/79  05/12/22 120/70  05/05/22 103/70   Pulse Readings from Last 3 Encounters:  05/16/22 68  05/12/22 64  05/05/22 82   Wt Readings from Last 3 Encounters:  05/12/22 216 lb (98 kg)  05/05/22 212 lb (96.2 kg)  04/17/22 222 lb 3.2 oz (100.8 kg)    Assessment/Interventions:  Review of patient past medical history, allergies, medications, health status, including review of consultants reports, laboratory and other test data, was performed as part of comprehensive evaluation and provision of chronic care management services.   SDOH:  (Social Determinants of Health) assessments and interventions performed: Yes   SDOH Interventions  Flowsheet Row Chronic Care Management from 05/27/2022 in [place] at [place] from 11/18/2020 in [place] at [place] Management from 11/01/2020 in [place] at [place] Patient Outreach Telephone from 07/30/2020 in [place] Management from 04/25/2020 in [place] at [place] from 12/02/2018 in [place] at [place] Interventions -- -- -- Intervention Not Indicated -- --  Transportation Interventions -- -- -- Intervention Not Indicated  [per wife, [name], on [facility] DPR, family provides transportation and patient continues to drive as well] Intervention Not Indicated --  Depression Interventions/Treatment  -- Currently on Treatment -- -- -- PHQ2-9 Score <4 Follow-up Not Indicated  Financial Strain Interventions Other (Comment)  [working on assistance once current inhaler is dropped from patient assistance] -- Other (Comment)  [working on patient assistance] -- Other (Comment)  [Applied for LIS/ Extra Help Program] --       CCM Care Plan  Allergies  Allergen Reactions   Penicillins Rash    Has patient had a PCN reaction causing immediate rash, facial/tongue/throat swelling, SOB or lightheadedness with hypotension: Yes Has patient had a PCN reaction causing severe rash involving mucus membranes or skin necrosis: No Has patient had a PCN reaction that required hospitalization: No Has patient had a PCN reaction occurring within the last 10 years: No If  all of the above answers are "NO", then may proceed with Cephalosporin use.    Medications Reviewed Today     Reviewed by Viona Gilmore, [facility] (Pharmacist) on 05/27/22 at [phone]  Med List Status: <None>   Medication Order Taking? Sig Documenting Provider Last Dose Status Informant  abiraterone acetate (ZYTIGA) 250 MG tablet [id number]  TAKE 4 TABLETS (1,000 MG TOTAL) BY MOUTH DAILY. TAKE ON AN EMPTY STOMACH 1 HOUR BEFORE OR 2 HOURS AFTER A MEAL Shadad, Mathis Dad, MD  Active Spouse/Significant Other  acetaminophen (TYLENOL) 500 MG tablet [id number]  Take 1,000 mg by mouth every 6 (six) hours as needed for headache or moderate pain (pain). [provider]  Active Spouse/Significant Other  albuterol (VENTOLIN HFA) 108 (90 Base) MCG/ACT inhaler [id number]  TAKE 2 PUFFS BY MOUTH EVERY 6 HOURS AS NEEDED FOR WHEEZE OR SHORTNESS OF BREATH Young, Kasandra Knudsen, MD  Active Spouse/Significant Other  ALPRAZolam Duanne Moron) 1 MG tablet [id number] Yes Take 0.5-1 tablets (0.5-1 mg total) by mouth 2 (two) times daily as needed for anxiety. [name], [name], MD Taking Active   aspirin EC 81 MG tablet [id number]  Take 81 mg by mouth daily. [provider]  Active Spouse/Significant Other  atorvastatin (LIPITOR) 20 MG tablet [id number]  Take 1 tablet (20 mg total) by mouth daily. [name], [name], MD  Active   benzonatate (TESSALON) 100 MG capsule [id number]  Take 1 capsule (100 mg total) by mouth daily as needed. [name], [name], MD  Active   budesonide-formoterol [facility]) 160-4.5 MCG/ACT inhaler [id number]  Inhale 2 puffs then rinse mouth, twice daily  Patient taking differently: Inhale 2 puffs into the lungs 2 (two) times daily. Inhale 2 puffs then rinse mouth, twice daily   Young, Clinton D, MD  Active Spouse/Significant Other  citalopram (CELEXA) 10 MG tablet [id number] Yes Take 1 tablet (10 mg total) by mouth daily. [name], [name], MD Taking Active   diclofenac Sodium (VOLTAREN) 1 % GEL [id number]  AAPLY 4  GRAMS TOPICALLY 4 TIMES DAILY  Patient taking differently: Apply  1 Application topically 4 (four) times daily as needed (pain).   [name], [name], MD  Active Spouse/Significant Other  fluticasone (FLONASE) 50 MCG/ACT nasal spray [id number]  PLACE 1 SPRAY INTO BOTH NOSTRILS DAILY AS NEEDED FOR ALLERGIES OR RHINITIS (AT BEDTIME). [name], [name], MD  Active Spouse/Significant Other  furosemide (LASIX) 20 MG tablet [id number] No Take 1 tablet (20 mg total) by mouth daily.  Patient not taking: Reported on 05/27/2022   [name], [name], MD Not Taking Active   magnesium hydroxide (MILK OF MAGNESIA) 400 MG/5ML suspension [id number]  Take by mouth daily as needed for mild constipation. [provider]  Active Spouse/Significant Other  Melatonin 10 MG CAPS [id number]  Take 20 mg by mouth at bedtime. [provider]  Active Spouse/Significant Other  metoprolol tartrate (LOPRESSOR) 25 MG tablet [id number]  Take 1 tablet (25 mg total) by mouth 2 (two) times daily. [name], [name], MD  Active   Multiple Vitamin (MULTIVITAMIN WITH MINERALS) TABS tablet [id number]  Take 0.5 tablets by mouth daily. [provider]  Active Spouse/Significant Other  omeprazole (PRILOSEC) 20 MG capsule [id number]  TAKE 1 CAPSULE BY MOUTH DAILY BEFORE BREAKFAST. [name], [name], MD  Active   predniSONE (DELTASONE) 5 MG tablet [id number]  Take 1 tablet (5 mg total) by mouth daily with breakfast. [place]gergawy, Silver Huguenin, MD  Active Spouse/Significant Other  Tiotropium Bromide Monohydrate (SPIRIVA RESPIMAT) 2.5 MCG/ACT AERS [id number] No Inhale 2 puffs into the lungs daily.  Patient not taking: Reported on 05/27/2022   [name], [name], MD Not Taking Active             Patient Active Problem List   Diagnosis Date Noted   Symptomatic cholelithiasis 05/15/2022   Postoperative hypotension 05/15/2022   Preoperative clearance 02/16/2022   Morbid obesity ([place]) BMI 35.2 and HTN,OSA,HLD,COPD,GERD 01/14/2022   Abdominal  aortic aneurysm (AAA) 3.0 cm to 5.5 cm in diameter in male [facility]) 12/26/2021   Respiratory failure with hypoxia ([place]) 12/24/2021   COVID 12/24/2021   Severe sepsis (HCC)    Transaminitis    Chronic respiratory failure with hypoxia ([place]) 12/16/2021   Dysphagia 08/18/2021   Dyspnea 07/13/2021   Atherosclerosis of aorta ([place]) 06/20/2021   OSA (obstructive sleep apnea) 07/25/2020   Senile ecchymosis 05/21/2020   Obesity with body mass index (BMI) of 30.0 to 39.9 11/14/2019   Prediabetes 11/14/2019   Varicose veins of both lower extremities 05/17/2019   Bilateral lower extremity edema 03/13/2019   Hypokalemia 03/13/2019   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   SSS (sick sinus syndrome) ([place]) 06/14/2018   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   Coronary artery disease involving native coronary artery of native heart without angina pectoris 06/08/2018   Sick sinus syndrome ([place]) 06/08/2018   Aneurysm of thoracic aorta ([place]) 02/03/2018   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   COPD mixed type ([place]) 02/03/2018   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   Acute appendicitis 01/21/2018   Perforated appendicitis 01/21/2018   Chronic fatigue 06/01/2017   Essential hypertension, benign 07/09/2016   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[place]) 07/06/2016   Coronary artery calcification seen on CAT scan 05/15/2016   Anxiety disorder 04/08/2016   Insomnia 04/08/2016   Atherosclerosis of coronary artery without angina pectoris 04/08/2016   Tobacco use disorder 04/08/2016   Hypercholesterolemia 04/08/2016   Malignant neoplasm of prostate ([place]) 02/23/2014    Immunization History  Administered Date(s) Administered   Fluad Quad(high Dose 65+) 05/17/2019, 05/21/2020, 06/20/2021   Influenza, High Dose Seasonal PF 05/05/2016, 06/01/2017,  06/22/2018   PFIZER(Purple Top)SARS-COV-2 Vaccination 10/05/2019, 10/26/2019, 06/18/2020   Pneumococcal Conjugate-13 12/27/2013   Pneumococcal Polysaccharide-23 07/31/2020   Td  02/13/2019   Tdap 01/03/2015   Spoke with patient's wife for most of the call and she was very confused about his inhalers. Patient got a sample of Breo and Incruse from the hospital and is still using them. He just finished the Incruse yesterday and they weren't sure what to replace it with. Patient has not even started with the Spiriva at all.  Patient is not using the rescue inhaler often. He doesn't even remember the last time he used it. He has oxygen now and uses this just when he goes out and at night.   Patient's wife reports patient doesn't weigh at home and then said that he does sometimes. Patient and wife seemed very confused about inhalers and medications in general.  Conditions to be addressed/monitored:  Hypertension, Hyperlipidemia, Coronary Artery Disease, Anxiety and prostate cancer, insomnia, and prediabetes  Conditions addressed this visit: Hypertension, COPD  Care Plan : CCM Pharmacy Care Plan  Updates made by Viona Gilmore, [place] since 05/27/2022 12:00 AM     Problem: Problem: Hypertension, Hyperlipidemia, Coronary Artery Disease, Anxiety and prostate cancer, insomnia, and prediabetes      Long-Range Goal: Patient-Specific Goal   Start Date: 11/01/2020  Expected End Date: 11/01/2021  Recent Progress: On track  Priority: High  Note:   Current Barriers:  Unable to independently afford treatment regimen Unable to independently monitor therapeutic efficacy  Pharmacist Clinical Goal(s):  Patient will verbalize ability to afford treatment regimen achieve adherence to monitoring guidelines and medication adherence to achieve therapeutic efficacy through collaboration with PharmD and provider.   Interventions: 1:1 collaboration with [name], [name], MD regarding development and update of comprehensive plan of care as evidenced by provider attestation and co-signature Inter-disciplinary care team collaboration (see longitudinal plan of care) Comprehensive medication  review performed; medication list updated in electronic medical record  Hypertension (BP goal <140/90) -Controlled -Current treatment: No medications -Medications previously tried: amlodipine, metoprolol, furosemide (low BP) -Current home readings: has not checked in a few weeks -Current dietary habits: did not discuss -Current exercise habits:  PT exercises a couple times a week (not every day) -Denies hypotensive/hypertensive symptoms -Educated on Exercise goal of 150 minutes per week; Importance of home blood pressure monitoring; -Counseled to monitor BP at home weekly, document, and provide log at future appointments -Recommended to continue current medication  Hyperlipidemia/Coronary artery disease: (LDL goal < 70) -Uncontrolled -Current treatment: Aspirin 81 mg daily - Appropriate, Effective, Safe, Accessible Atorvastatin 20 mg daily  - Appropriate, Query effective, Safe, Accessible -Medications previously tried: none -Current dietary patterns: did not discuss -Current exercise habits: PT exercises a couple times a week (not every day) -Educated on Cholesterol goals;  Exercise goal of 150 minutes per week; -Counseled on diet and exercise extensively Recommended to continue current medication Recommended repeat lipid panel.  Emphysema lung (Goal: control symptoms and prevent exacerbations) -Controlled -Current treatment  Albuterol 108 mcg/act 2 puff every 6 hours as needed - Appropriate, Effective, Safe, Accessible Symbicort 2 puffs twice daily - Appropriate, Query effective, Safe, Accessible Spiriva 2 puffs once daily - not started -Medications previously tried: Advair (cost) -Pulmonary function testing: n/a -Exacerbations requiring treatment in last 6 months: none -Patient reports consistent use of maintenance inhaler -Frequency of rescue inhaler use: every other day -Counseled on When to use rescue inhaler Differences between maintenance and rescue  inhalers -Recommended finishing up  course of Breo and then switching back to Symbicort.  Pre-diabetes (A1c goal <6.5%) -Controlled -Current medications: No medications -Medications previously tried: none  -Current home glucose readings fasting glucose: n/a post prandial glucose: n/a -Denies hypoglycemic/hyperglycemic symptoms -Current exercise: PT exercises a couple times a week (not every day) -Educated onA1c and blood sugar goals; -Counseled to check feet daily and get yearly eye exams -Counseled on diet and exercise extensively  Depression (Goal: minimize symptoms of depression) -Controlled -Current treatment: Citalopram 10 mg daily  - Appropriate, Effective, Safe, Accessible Alprazolam 1 mg 1/2 tablet twice daily as needed (twice daily consistently) - Appropriate, Effective, Query Safe, Accessible -Medications previously tried/failed: lorazepam (unable to find at pharmacy) -PHQ9: 0 -GAD7: n/a -Educated on Benefits of medication for symptom control -Recommended to continue current medication Counseled on long term risks associated with benzodiazepines such as falls, fractures, and memory loss  Prostate cancer -Controlled -Current treatment  Abiraterone (Zytiga) 250 mg 4 tablets daily (AM) - Appropriate, Effective, Safe, Accessible  Prednisone 5 mg daily (AM)  - Appropriate, Effective, Safe, Accessible -Medications previously tried: n/a  -Recommended to continue current medication  GERD (Goal: minimize symptoms of heartburn or acid reflux) -Controlled -Current treatment  Omeprazole 20 mg daily before breakfast - Appropriate, Effective, Safe, Accessible -Medications previously tried: none  -Counseled on diet and exercise extensively Recommended to continue current medication   Health Maintenance -Vaccine gaps: Shingles -Current therapy:  Acetaminophen 500 mg q6hr PRN  Carbamide peroxide 6.5% otic solution Flonase Nasal Spray 50 mcg/act 1 spray daily PRN   Multivitamin 1/2 tablet daily (Centrum)  Miralax 17g daily PRN (rarely uses)  Senna 8.6 mg 2 tablets daily as needed  -Educated on Cost vs benefit of each product must be carefully weighed by individual consumer -Patient is satisfied with current therapy and denies issues -Recommended to continue current medication  Patient Goals/Self-Care Activities Patient will:  - take medications as prescribed check blood pressure weekly, document, and provide at future appointments  Follow Up Plan: The care management team will reach out to the patient again over the next 7-14 days.         Compliance/Adherence/Medication fill history: Care Gaps: Shingrix, COVID booster, influenza BP- 120/70 05/12/22  Star-Rating Drugs: Atorvastatin 20 mg - last filled 04/24/2022 90 DS at [facility]  Medication Assistance: Application for Advair HFA and Ventolin HFA  medication assistance program. in process.  Anticipated assistance start date unknown where patient is in application process.  See plan of care for additional detail.  Patient's preferred pharmacy is:  [id number] [address]Phone: [phone]: [phone] [address]Phone: [phone]: [phone] [address]Phone: [phone]: [phone] [facility] - [address]Phone: [phone]: [phone]Phone: [phone]: [phone]  Uses pill box? Yes Pt endorses 100% compliance  We discussed: Current pharmacy is preferred with insurance plan and patient is satisfied with pharmacy  services Patient decided to: Continue current medication management strategy  Care Plan and Follow Up Patient Decision:  Patient agrees to Care Plan and Follow-up.  Plan: The care management  team will reach out to the patient again over the next 14 days.  Jeni Salles, PharmD [facility] Clinical Pharmacist [place] at [place]

## 2022-05-27 ENCOUNTER — Ambulatory Visit (INDEPENDENT_AMBULATORY_CARE_PROVIDER_SITE_OTHER): Payer: Medicare Other | Admitting: Pharmacist

## 2022-05-27 DIAGNOSIS — J441 Chronic obstructive pulmonary disease with (acute) exacerbation: Secondary | ICD-10-CM

## 2022-05-27 DIAGNOSIS — I10 Essential (primary) hypertension: Secondary | ICD-10-CM

## 2022-06-01 ENCOUNTER — Other Ambulatory Visit (HOSPITAL_COMMUNITY): Payer: Self-pay

## 2022-06-08 ENCOUNTER — Telehealth: Payer: Self-pay | Admitting: Pharmacist

## 2022-06-08 NOTE — Chronic Care Management (AMB) (Signed)
Chronic Care Management Pharmacy Assistant   Name: Tony [name] [facility]  MRN: [id number] DOB: 1940-12-04  Reason for Encounter: Disease State   Conditions to be addressed/monitored: COPD  Recent office visits:  None  Recent consult visits:  06/02/22 Miguel Dibble, MD - Patient presented for Post op Visit. No medication changes.  Hospital visits:  Medication Reconciliation was completed by comparing discharge summary, patient's EMR and Pharmacy list, and upon discussion with patient.  Patient presented to [facility] on 05/15/22 due to Symptomatic cholelithiasis. Patient was present for 29 hours.  New?Medications Started at [facility] Discharge:?? -started  traMADol Veatrice [place])  Medication Changes at Hospital Discharge: none  Medications Discontinued at Hospital Discharge: none  Medications that remain the same after Hospital Discharge:??  -All other medications will remain the same.    Medications: Outpatient Encounter Medications as of 06/08/2022  Medication Sig   abiraterone acetate (ZYTIGA) 250 MG tablet TAKE 4 TABLETS (1,000 MG TOTAL) BY MOUTH DAILY. TAKE ON AN EMPTY STOMACH 1 HOUR BEFORE OR 2 HOURS AFTER A MEAL   acetaminophen (TYLENOL) 500 MG tablet Take 1,000 mg by mouth every 6 (six) hours as needed for headache or moderate pain (pain).   albuterol (VENTOLIN HFA) 108 (90 Base) MCG/ACT inhaler TAKE 2 PUFFS BY MOUTH EVERY 6 HOURS AS NEEDED FOR WHEEZE OR SHORTNESS OF BREATH   ALPRAZolam (XANAX) 1 MG tablet Take 0.5-1 tablets (0.5-1 mg total) by mouth 2 (two) times daily as needed for anxiety.   aspirin EC 81 MG tablet Take 81 mg by mouth daily.   atorvastatin (LIPITOR) 20 MG tablet Take 1 tablet (20 mg total) by mouth daily.   benzonatate (TESSALON) 100 MG capsule Take 1 capsule (100 mg total) by mouth daily as needed.   budesonide-formoterol (SYMBICORT) 160-4.5 MCG/ACT inhaler Inhale 2 puffs then rinse mouth, twice daily (Patient taking differently: Inhale  2 puffs into the lungs 2 (two) times daily. Inhale 2 puffs then rinse mouth, twice daily)   citalopram (CELEXA) 10 MG tablet Take 1 tablet (10 mg total) by mouth daily.   diclofenac Sodium (VOLTAREN) 1 % GEL AAPLY 4 GRAMS TOPICALLY 4 TIMES DAILY (Patient taking differently: Apply 1 Application topically 4 (four) times daily as needed (pain).)   fluticasone (FLONASE) 50 MCG/ACT nasal spray PLACE 1 SPRAY INTO BOTH NOSTRILS DAILY AS NEEDED FOR ALLERGIES OR RHINITIS (AT BEDTIME).   furosemide (LASIX) 20 MG tablet Take 1 tablet (20 mg total) by mouth daily. (Patient not taking: Reported on 05/27/2022)   magnesium hydroxide (MILK OF MAGNESIA) 400 MG/5ML suspension Take by mouth daily as needed for mild constipation.   Melatonin 10 MG CAPS Take 20 mg by mouth at bedtime.   metoprolol tartrate (LOPRESSOR) 25 MG tablet Take 1 tablet (25 mg total) by mouth 2 (two) times daily.   Multiple Vitamin (MULTIVITAMIN WITH MINERALS) TABS tablet Take 0.5 tablets by mouth daily.   omeprazole (PRILOSEC) 20 MG capsule TAKE 1 CAPSULE BY MOUTH DAILY BEFORE BREAKFAST.   predniSONE (DELTASONE) 5 MG tablet Take 1 tablet (5 mg total) by mouth daily with breakfast.   Tiotropium Bromide Monohydrate (SPIRIVA RESPIMAT) 2.5 MCG/ACT AERS Inhale 2 puffs into the lungs daily. (Patient not taking: Reported on 05/27/2022)   No facility-administered encounter medications on file as of 06/08/2022.  Current COPD regimen: Albuterol 108 mcg/act 2 puff every 6 hours as needed - Appropriate, Effective, Safe, Accessible Symbicort 2 puffs twice daily - Appropriate, Query effective, Safe, Accessible Spiriva 2 puffs once daily -  not started     No data to display         Any recent hospitalizations or ED visits since last visit with CPP? No Denies COPD symptoms, including Increased shortness of breath  What recent interventions/DTPs have been made by any provider to improve breathing since last visit: Patient was recently sampled on the  spiriva. Have you had exacerbation/flare-up since last visit? No What do you do when you are short of breath?  Rescue medication Spoke to wife whom reports that the Spiriva has been working well he has not coughed and not complained of any SOB she reports it is hard for him to dispense the medication but she thinks he is getting the hang of it. She reports they can pick up an additional sample when they come to the office next week on the 4th.    Adherence Review: Does the patient have >5 day gap between last estimated fill date for maintenance inhaler medications? No    Care Gaps: Flu Vaccine - Overdue COVID Booster - Postponed Zoster Vaccine - Postponed BP- 120/70 8/23 AWV- 11/22  Star Rating Drugs: Atorvastatin 20 mg - last filled 04/24/2022 90 DS at [place] Pharmacist Assistant [phone]

## 2022-06-10 ENCOUNTER — Encounter: Payer: Self-pay | Admitting: Oncology

## 2022-06-10 ENCOUNTER — Telehealth: Payer: Self-pay

## 2022-06-10 ENCOUNTER — Telehealth: Payer: Self-pay | Admitting: Pharmacy Technician

## 2022-06-10 ENCOUNTER — Other Ambulatory Visit: Payer: Self-pay | Admitting: Oncology

## 2022-06-10 ENCOUNTER — Other Ambulatory Visit (HOSPITAL_COMMUNITY): Payer: Self-pay

## 2022-06-10 DIAGNOSIS — E669 Obesity, unspecified: Secondary | ICD-10-CM

## 2022-06-10 DIAGNOSIS — C61 Malignant neoplasm of prostate: Secondary | ICD-10-CM

## 2022-06-10 MED ORDER — ENZALUTAMIDE 40 MG PO TABS
160.0000 mg | ORAL_TABLET | Freq: Every day | ORAL | 3 refills | Status: DC
  Filled 2022-06-10: qty 120, 30d supply, fill #0

## 2022-06-10 MED ORDER — ENZALUTAMIDE 40 MG PO TABS: 160.0000 mg | ORAL_TABLET | Freq: Every day | ORAL | 3 refills | Status: DC

## 2022-06-10 NOTE — Telephone Encounter (Signed)
Oral Oncology Patient Advocate Encounter  Prior Authorization for Gillermina Phy has been approved.    PA# [id number] Effective dates: 06/10/2022 through 09/13/2022  Patients co-pay is $2,142.25.    Lady Deutscher, CPhT-Adv Oncology Pharmacy Patient [place] Direct Number: [phone]  Fax: [phone]

## 2022-06-10 NOTE — Telephone Encounter (Signed)
Oral Oncology Patient Advocate Encounter   Received notification that prior authorization for Gillermina Phy is required.   PA submitted on 06/10/2022 Key [id number] Status is pending     Lady Deutscher, CPhT-Adv Oncology Pharmacy Patient [place] Direct Number: [phone]  Fax: [phone]

## 2022-06-10 NOTE — Telephone Encounter (Signed)
Oral Oncology Patient Advocate Encounter  Completed application for XTANDI in an effort to reduce patient's out of pocket expense to $0.    Application completed online and submitted to [employer].   [employer] patient assistance phone number for follow up is [phone].   This encounter will be updated until final determination.    Eyden Dobie, CPhT-Adv Oncology Pharmacy Patient Advocate  [facility] Direct Number: [phone]  Fax: [phone]   

## 2022-06-11 NOTE — Telephone Encounter (Signed)
Oral Oncology Pharmacist Encounter  Received new prescription for enzalutamide Gillermina Phy) for the treatment of castration- sensitive advanced prostate cancer, planned duration until disease progression or unacceptable toxicity.  Labs from 04/17/22 and 05/15/22 assessed, no interventions needed. Prescription dose and frequency assessed. Medication redirected to [facility] for patient assistance.   Current medication list in Epic reviewed, DDIs with Xtandi identified: - xtandi may decrease the concentrations of citalopram and atorvastatin. Will have patient monitor for decreased efficacy  Evaluated chart and no patient barriers to medication adherence noted.   Patient agreement for treatment documented in MD note on 04/17/22. Patient switching therapy due to cost of the zytiga medication. Discussed with patient and MD.  Prescription has been e-scribed to the [facility] for benefits analysis and approval.  Oral Oncology Clinic will continue to follow for insurance authorization, copayment issues, initial counseling and start date.  Drema Halon, PharmD Hematology/Oncology Clinical Pharmacist [facility] [phone] 06/11/2022 9:00 AM

## 2022-06-13 DIAGNOSIS — I10 Essential (primary) hypertension: Secondary | ICD-10-CM

## 2022-06-13 DIAGNOSIS — J441 Chronic obstructive pulmonary disease with (acute) exacerbation: Secondary | ICD-10-CM

## 2022-06-13 DIAGNOSIS — J9611 Chronic respiratory failure with hypoxia: Secondary | ICD-10-CM | POA: Diagnosis not present

## 2022-06-13 DIAGNOSIS — E785 Hyperlipidemia, unspecified: Secondary | ICD-10-CM

## 2022-06-13 DIAGNOSIS — I2510 Atherosclerotic heart disease of native coronary artery without angina pectoris: Secondary | ICD-10-CM | POA: Diagnosis not present

## 2022-06-15 MED ORDER — ENZALUTAMIDE 40 MG PO TABS: 160.0000 mg | ORAL_TABLET | Freq: Every day | ORAL | 3 refills | Status: DC

## 2022-06-15 NOTE — Telephone Encounter (Signed)
Oral Oncology Patient Advocate Encounter   Received notification that the application for assistance for XTANDI through [place] has been approved.   [employer] phone number [phone].  Rx will be managed and shipped by [facility]   Effective dates: 09.30.23 through 12.31.23  I have spoken to the patient.  Berdine Addison, [facility] Patient [place]  [phone] (phone) [phone] (fax)

## 2022-06-16 ENCOUNTER — Other Ambulatory Visit (HOSPITAL_COMMUNITY): Payer: Self-pay

## 2022-06-16 ENCOUNTER — Ambulatory Visit (INDEPENDENT_AMBULATORY_CARE_PROVIDER_SITE_OTHER): Payer: Medicare Other

## 2022-06-16 DIAGNOSIS — I495 Sick sinus syndrome: Secondary | ICD-10-CM

## 2022-06-16 LAB — CUP PACEART REMOTE DEVICE CHECK
Battery Remaining Longevity: 116 mo
Battery Voltage: 3.01 V
Brady Statistic AP VP Percent: 0.06 %
Brady Statistic AP VS Percent: 83.1 %
Brady Statistic AS VP Percent: 0.01 %
Brady Statistic AS VS Percent: 16.83 %
Brady Statistic RA Percent Paced: 82.59 %
Brady Statistic RV Percent Paced: 0.07 %
Date Time Interrogation Session: 20231003013419
Implantable Lead Implant Date: 20191001
Implantable Lead Implant Date: 20191001
Implantable Lead Location: 753859
Implantable Lead Location: 753860
Implantable Lead Model: 5076
Implantable Lead Model: 5076
Implantable Pulse Generator Implant Date: 20191001
Lead Channel Impedance Value: 304 Ohm
Lead Channel Impedance Value: 323 Ohm
Lead Channel Impedance Value: 380 Ohm
Lead Channel Impedance Value: 456 Ohm
Lead Channel Pacing Threshold Amplitude: 0.5 V
Lead Channel Pacing Threshold Amplitude: 0.75 V
Lead Channel Pacing Threshold Pulse Width: 0.4 ms
Lead Channel Pacing Threshold Pulse Width: 0.4 ms
Lead Channel Sensing Intrinsic Amplitude: 2.25 mV
Lead Channel Sensing Intrinsic Amplitude: 2.25 mV
Lead Channel Sensing Intrinsic Amplitude: 2.375 mV
Lead Channel Sensing Intrinsic Amplitude: 2.375 mV
Lead Channel Setting Pacing Amplitude: 1.5 V
Lead Channel Setting Pacing Amplitude: 2.5 V
Lead Channel Setting Pacing Pulse Width: 0.4 ms
Lead Channel Setting Sensing Sensitivity: 0.9 mV

## 2022-06-16 NOTE — Progress Notes (Unsigned)
HPI: [name] is a 81 y.o. male, who is here today f for follow-up.  Last seen on 05/12/22 Since his last visit he has had laparoscopic cholecystectomy, 06/02/2022. He is recovering well, having no pain. He did not need to take pain medication. Jaundice has resolved. He feels "great." Lab Results  Component Value Date   ALT 15 04/17/2022   AST 18 04/17/2022   ALKPHOS 84 04/17/2022   BILITOT 0.9 04/17/2022   Has decreased sweets intake, therefore he has lost some wt.  COPD:Cough and wheezing have also improved since he started Spiriva Respimat 2.5 mcg 2 puffs daily. He is also on Symbicort 160-4.5 mcg 2 puffs twice daily. The weight has also improved, he has been walking to get his mail, which he has difficulty doing so before. He takes Benzonatate less frequent.  Anxiety: He was having difficulty obtaining lorazepam, which he has been taking for years, so he was changed to alprazolam.  He is taking alprazolam 1 mg 1/2 tablet twice daily and feels like it helps more than lorazepam. He has not noticed side effects. He is also on Celexa 10 mg daily.  Hypertension: On metoprolol tartrate 25 mg twice daily. Negative for unusual or severe headache, visual changes, exertional chest pain,focal weakness, or worsening edema.  Lab Results  Component Value Date   CREATININE 1.16 05/15/2022   BUN 16 04/17/2022   NA 137 04/17/2022   K 4.7 04/17/2022   CL 103 04/17/2022   CO2 27 04/17/2022   This past weekend he started with edema of left lower eyelid and mild tenderness. Negative for conjunctival erythema or drainage. He has been applying local heat.  Review of Systems  Constitutional:  Positive for activity change (improved). Negative for appetite change, chills and fever.  HENT:  Negative for mouth sores, sore throat and trouble swallowing.   Gastrointestinal:  Negative for abdominal pain, nausea and vomiting.  Genitourinary:  Negative for decreased urine volume,  dysuria and hematuria.  Neurological:  Negative for syncope and facial asymmetry.  Psychiatric/Behavioral:  Negative for confusion. The patient is nervous/anxious.   Rest of ROS: See pertinent positives and negatives in HPI.  Current Outpatient Medications on File Prior to Visit  Medication Sig Dispense Refill   abiraterone acetate (ZYTIGA) 250 MG tablet TAKE 4 TABLETS (1,000 MG TOTAL) BY MOUTH DAILY. TAKE ON AN EMPTY STOMACH 1 HOUR BEFORE OR 2 HOURS AFTER A MEAL 120 tablet 2   acetaminophen (TYLENOL) 500 MG tablet Take 1,000 mg by mouth every 6 (six) hours as needed for headache or moderate pain (pain).     albuterol (VENTOLIN HFA) 108 (90 Base) MCG/ACT inhaler TAKE 2 PUFFS BY MOUTH EVERY 6 HOURS AS NEEDED FOR WHEEZE OR SHORTNESS OF BREATH 18 each 12   ALPRAZolam (XANAX) 1 MG tablet Take 0.5-1 tablets (0.5-1 mg total) by mouth 2 (two) times daily as needed for anxiety. 60 tablet 0   aspirin EC 81 MG tablet Take 81 mg by mouth daily.     atorvastatin (LIPITOR) 20 MG tablet Take 1 tablet (20 mg total) by mouth daily. 90 tablet 1   benzonatate (TESSALON) 100 MG capsule Take 1 capsule (100 mg total) by mouth daily as needed. 30 capsule 1   budesonide-formoterol (SYMBICORT) 160-4.5 MCG/ACT inhaler Inhale 2 puffs then rinse mouth, twice daily (Patient taking differently: Inhale 2 puffs into the lungs 2 (two) times daily. Inhale 2 puffs then rinse mouth, twice daily) 1 each 12   citalopram (CELEXA) 10  MG tablet Take 1 tablet (10 mg total) by mouth daily. 90 tablet 3   diclofenac Sodium (VOLTAREN) 1 % GEL AAPLY 4 GRAMS TOPICALLY 4 TIMES DAILY (Patient taking differently: Apply 1 Application topically 4 (four) times daily as needed (pain).) 400 g 2   enzalutamide (XTANDI) 40 MG tablet Take 4 tablets (160 mg total) by mouth daily. 120 tablet 3   fluticasone (FLONASE) 50 MCG/ACT nasal spray PLACE 1 SPRAY INTO BOTH NOSTRILS DAILY AS NEEDED FOR ALLERGIES OR RHINITIS (AT BEDTIME). 48 mL 1   furosemide (LASIX)  20 MG tablet Take 1 tablet (20 mg total) by mouth daily. 90 tablet 1   magnesium hydroxide (MILK OF MAGNESIA) 400 MG/5ML suspension Take by mouth daily as needed for mild constipation.     Melatonin 10 MG CAPS Take 20 mg by mouth at bedtime.     metoprolol tartrate (LOPRESSOR) 25 MG tablet Take 1 tablet (25 mg total) by mouth 2 (two) times daily. 180 tablet 2   Multiple Vitamin (MULTIVITAMIN WITH MINERALS) TABS tablet Take 0.5 tablets by mouth daily.     omeprazole (PRILOSEC) 20 MG capsule TAKE 1 CAPSULE BY MOUTH DAILY BEFORE BREAKFAST. 90 capsule 3   predniSONE (DELTASONE) 5 MG tablet Take 1 tablet (5 mg total) by mouth daily with breakfast. 90 tablet 3   Tiotropium Bromide Monohydrate (SPIRIVA RESPIMAT) 2.5 MCG/ACT AERS Inhale 2 puffs into the lungs daily. 4 g 0   No current facility-administered medications on file prior to visit.   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/03/2018   [name] 02/03/2018   GERD (gastroesophageal reflux disease)    History of hiatal hernia    small/notes 02/03/2018   Hypertension    no meds   Presence of permanent cardiac pacemaker    Prostate cancer ([place]) 02/08/2014   Gleason 4+5=9, PSA 15.65   Radiation    Sinus problem    SSS (sick sinus syndrome) (HCC)    Allergies  Allergen Reactions   Penicillins Rash    Has patient had a PCN reaction causing immediate rash, facial/tongue/throat swelling, SOB or lightheadedness with hypotension: Yes Has patient had a PCN reaction causing severe rash involving mucus membranes or skin necrosis: No Has patient had a PCN reaction that required hospitalization: No Has patient had a PCN reaction occurring within the last 10 years: No If all of the above answers are "NO", then may proceed with Cephalosporin use.    Social History   Socioeconomic History   Marital status: Married     Spouse name: Not on file   Number of children: 3   Years of education: Not on file   Highest education level: Not on file  Occupational History   Occupation: Freight forwarder    Employer: [employer]    Comment: retired   Occupation: Freight forwarder    Comment: [employer]-[place]  Tobacco Use   Smoking status: Former    Types: Cigars    Quit date: 09/14/2012    Years since quitting: 9.7   Smokeless tobacco: Never   Tobacco comments:    little cigars; the small ones"  Vaping Use   Vaping Use: Never used  Substance and Sexual Activity   Alcohol use: Not Currently    Alcohol/week: 14.0 standard drinks of alcohol    Types: 14 Cans of beer per week  Drug use: No   Sexual activity: Not Currently  Other Topics Concern   Not on file  Social History Narrative   Lives with wife and 2 dogs in one level home; daughter and her family co-habitate.   Has three daughters, all in [place], supportive. Five grandchildren.   Wants to return to doing silver sneakers once gym re-opens.         Social Determinants of Health   Financial Resource Strain: Medium Risk (05/27/2022)   Overall Financial Resource Strain (CARDIA)    Difficulty of Paying Living Expenses: Somewhat hard  Food Insecurity: No Food Insecurity (07/22/2021)   Hunger Vital Sign    Worried About Running Out of Food in the Last Year: Never true    Ran Out of Food in the Last Year: Never true  Transportation Needs: No Transportation Needs (07/22/2021)   PRAPARE - Hydrologist (Medical): No    Lack of Transportation (Non-Medical): No  Physical Activity: Insufficiently Active (07/22/2021)   Exercise Vital Sign    Days of Exercise per Week: 5 days    Minutes of Exercise per Session: 20 min  Stress: No Stress Concern Present (07/22/2021)   [place]    Feeling of Stress : Not at all  Social Connections: Moderately Integrated (07/22/2021)   Social Connection  and Isolation Panel [NHANES]    Frequency of Communication with Friends and Family: More than three times a week    Frequency of Social Gatherings with Friends and Family: More than three times a week    Attends Religious Services: More than 4 times per year    Active Member of [employer] or Organizations: No    Attends Archivist Meetings: Not on file    Marital Status: Married   Vitals:   06/17/22 0911  BP: 128/80  Pulse: 80  Resp: 16  Temp: 97.9 F (36.6 C)  SpO2: 97%  Body mass index is 32.08 kg/m.  Physical Exam Vitals and nursing note reviewed.  Constitutional:      General: He is not in acute distress.    Appearance: He is well-developed.  HENT:     Head: Normocephalic and atraumatic.     Mouth/Throat:     Mouth: Mucous membranes are moist.     Dentition: Has dentures.     Pharynx: Oropharynx is clear.  Eyes:     General:        Left eye: Hordeolum present.No discharge.     Conjunctiva/sclera: Conjunctivae normal.   Cardiovascular:     Rate and Rhythm: Normal rate and regular rhythm.     Heart sounds: No murmur heard.    Comments: DP and PT pulses palpable. Pulmonary:     Effort: Pulmonary effort is normal. No respiratory distress.     Breath sounds: Normal breath sounds.  Abdominal:     Palpations: Abdomen is soft. There is no mass.     Tenderness: There is no abdominal tenderness.     Comments: Laparoscopic surgical wounds healing well.  Musculoskeletal:     Right lower leg: 1+ Pitting Edema present.     Left lower leg: 1+ Pitting Edema present.  Lymphadenopathy:     Cervical: No cervical adenopathy.  Skin:    General: Skin is warm.     Findings: No erythema or rash.  Neurological:     Mental Status: He is alert and oriented to person, place, and time.  Cranial Nerves: No cranial nerve deficit.     Comments: Gait assisted with a cane.  Psychiatric:        Mood and Affect: Affect normal. Mood is anxious.   ASSESSMENT AND PLAN:  Mr. Nechemia  was seen today for follow-up.  Diagnoses and all orders for this visit: Orders Placed This Encounter  Procedures   Flu Vaccine QUAD High Dose(Fluad)   Hordeolum externum of left lower eyelid We discussed diagnosis and treatment options. Explained that it is usually a sterile lesion, still recommend topical antibiotic ointment at bedtime daily for 7 days. Local dry heat a few times during the day. Monitor for new symptoms. Follow-up as needed.  -     erythromycin ophthalmic ointment; Place 1 Application into the left eye at bedtime for 7 days.  Essential hypertension, benign BP adequately controlled. Continue metoprolol titrate 25 mg twice daily. Eye exam is current. Continue low-salt diet.  Anxiety disorder Problem has improved. Continue Celexa 10 mg daily and alprazolam 1 mg 1/2 tablet twice daily.  We discussed some side effects of medications. Follow-up in 09/2022, before if needed.  COPD mixed type ([place]) Symptoms have improved with Spiriva Respimat 2.5 mcg, she will continue 2 puffs daily.  Because of cost, form for patient assistance program has been completed. No changes in Symbicort dose. He is following with pulmonologist.  Need for influenza vaccination -     Flu Vaccine QUAD High Dose(Fluad)  Return if symptoms worsen or fail to improve, for Keep appt in 09/2022.[name]  Azell Bill G. [name], MD  [facility]. [place] office.

## 2022-06-16 NOTE — Telephone Encounter (Signed)
Oral Chemotherapy Pharmacist Encounter  I spoke to patient after speaking with [facility] pharmacy. Phone number was provided to patient and he will call this morning to set up delivery of the xtandi.   I will follow up with patient later this afternoon to make sure patient has set up shipment.   Drema Halon, PharmD Hematology/Oncology Clinical Pharmacist [facility] [phone]

## 2022-06-17 ENCOUNTER — Ambulatory Visit (INDEPENDENT_AMBULATORY_CARE_PROVIDER_SITE_OTHER): Payer: Medicare Other | Admitting: Family Medicine

## 2022-06-17 ENCOUNTER — Encounter: Payer: Self-pay | Admitting: Family Medicine

## 2022-06-17 VITALS — BP 128/80 | HR 80 | Temp 97.9°F | Resp 16 | Ht 68.0 in | Wt 211.0 lb

## 2022-06-17 DIAGNOSIS — F064 Anxiety disorder due to known physiological condition: Secondary | ICD-10-CM | POA: Diagnosis not present

## 2022-06-17 DIAGNOSIS — Z23 Encounter for immunization: Secondary | ICD-10-CM

## 2022-06-17 DIAGNOSIS — J449 Chronic obstructive pulmonary disease, unspecified: Secondary | ICD-10-CM | POA: Diagnosis not present

## 2022-06-17 DIAGNOSIS — H00015 Hordeolum externum left lower eyelid: Secondary | ICD-10-CM

## 2022-06-17 DIAGNOSIS — I10 Essential (primary) hypertension: Secondary | ICD-10-CM | POA: Diagnosis not present

## 2022-06-17 MED ORDER — ERYTHROMYCIN 5 MG/GM OP OINT: 1.0000 | TOPICAL_OINTMENT | Freq: Every day | OPHTHALMIC | 0 refills | Status: AC

## 2022-06-17 NOTE — Assessment & Plan Note (Signed)
Symptoms have improved with Spiriva Respimat 2.5 mcg, she will continue 2 puffs daily.  Because of cost, form for patient assistance program has been completed. No changes in Symbicort dose. He is following with pulmonologist.

## 2022-06-17 NOTE — Assessment & Plan Note (Signed)
BP adequately controlled. Continue metoprolol titrate 25 mg twice daily. Eye exam is current. Continue low-salt diet.

## 2022-06-17 NOTE — Assessment & Plan Note (Signed)
Problem has improved. Continue Celexa 10 mg daily and alprazolam 1 mg 1/2 tablet twice daily.  We discussed some side effects of medications. Follow-up in 09/2022, before if needed.

## 2022-06-17 NOTE — Patient Instructions (Addendum)
A few things to remember from today's visit:  COPD mixed type [facility])  Anxiety disorder due to known physiological condition  Essential hypertension, benign  No changes in medications today. After you have healed from the surgery you can start walking for 10 to 10 minutes as tolerated a few times per week.  Patient assistant will be processed to get Spiriva.  For now no changes in Symbicort, continue rinsing her mouth after use. I will see you back in January next year.  If you need refills for medications you take chronically, please call your pharmacy. Do not use My Chart to request refills or for acute issues that need immediate attention. If you send a my chart message, it may take a few days to be addressed, specially if I am not in the office.  Please be sure medication list is accurate. If a new problem present, please set up appointment sooner than planned today.

## 2022-06-20 ENCOUNTER — Other Ambulatory Visit: Payer: Self-pay | Admitting: Family Medicine

## 2022-06-20 ENCOUNTER — Other Ambulatory Visit: Payer: Self-pay | Admitting: Internal Medicine

## 2022-06-20 DIAGNOSIS — F064 Anxiety disorder due to known physiological condition: Secondary | ICD-10-CM

## 2022-06-20 DIAGNOSIS — J439 Emphysema, unspecified: Secondary | ICD-10-CM

## 2022-06-22 NOTE — Telephone Encounter (Signed)
-   last filled 05/19/22

## 2022-06-23 ENCOUNTER — Other Ambulatory Visit: Payer: Self-pay

## 2022-06-23 MED ORDER — SPIRIVA RESPIMAT 2.5 MCG/ACT IN AERS: 2.0000 | INHALATION_SPRAY | Freq: Every day | RESPIRATORY_TRACT | 0 refills | Status: DC

## 2022-06-26 NOTE — Progress Notes (Signed)
Remote pacemaker transmission.   

## 2022-06-26 NOTE — Telephone Encounter (Signed)
Oral Chemotherapy Pharmacist Encounter  I spoke with patient for overview of: Xtandi for the treatment of advanced, castration-sensitive prostate cancer, planned duration until disease progression or unacceptable toxicity.   Counseled patient on administration, dosing, side effects, monitoring, drug-food interactions, safe handling, storage, and disposal.  Patient will take Xtandi '40mg'$  capsules, 4 capsules ('160mg'$ ) by mouth once daily without regard to food.  Xtandi start date: 06/24/2022  Adverse effects include but are not limited to: peripheral edema, GI upset, hypertension, hot flashes, fatigue, falls/fractures, and arthralgias.   Patient instructed about small risk of seizures with Xtandi treatment.  Reviewed with patient importance of keeping a medication schedule and plan for any missed doses. No barriers to medication adherence identified.  Medication reconciliation performed and medication/allergy list updated.  Insurance authorization for Gillermina Phy has been obtained. Patient receives medication through patient assistance.  Patient informed the pharmacy will reach out 5-7 days prior to needing next fill of Xtandi to coordinate continued medication acquisition to prevent break in therapy.  All questions answered.  Tony [name] and his wife voiced understanding and appreciation.   Medication education handout placed in mail for patient. Patient knows to call the office with questions or concerns. Oral Chemotherapy Clinic phone number provided to patient.   Drema Halon, PharmD Hematology/Oncology Clinical Pharmacist [facility] [phone] 06/26/2022   12:50 PM

## 2022-07-13 ENCOUNTER — Ambulatory Visit (INDEPENDENT_AMBULATORY_CARE_PROVIDER_SITE_OTHER): Payer: Medicare Other | Admitting: Family Medicine

## 2022-07-13 ENCOUNTER — Encounter: Payer: Self-pay | Admitting: Family Medicine

## 2022-07-13 VITALS — BP 124/80 | HR 90 | Temp 97.7°F | Resp 16 | Ht 68.0 in | Wt 208.5 lb

## 2022-07-13 DIAGNOSIS — F418 Other specified anxiety disorders: Secondary | ICD-10-CM

## 2022-07-13 DIAGNOSIS — R600 Localized edema: Secondary | ICD-10-CM | POA: Diagnosis not present

## 2022-07-13 DIAGNOSIS — H9193 Unspecified hearing loss, bilateral: Secondary | ICD-10-CM

## 2022-07-13 DIAGNOSIS — J9611 Chronic respiratory failure with hypoxia: Secondary | ICD-10-CM | POA: Diagnosis not present

## 2022-07-13 MED ORDER — CITALOPRAM HYDROBROMIDE 10 MG PO TABS: 10.0000 mg | ORAL_TABLET | Freq: Every day | ORAL | 3 refills | Status: DC

## 2022-07-13 MED ORDER — ALPRAZOLAM 1 MG PO TABS: 0.5000 mg | ORAL_TABLET | Freq: Two times a day (BID) | ORAL | 0 refills | Status: AC | PRN

## 2022-07-13 NOTE — Patient Instructions (Addendum)
A few things to remember from today's visit:  Edema of left lower extremity  Other specified anxiety disorders - Plan: citalopram (CELEXA) 10 MG tablet  Anxiety disorder due to known physiological condition - Plan: ALPRAZolam (XANAX) 1 MG tablet  Left leg swealling has resolved, it dod not seem caused by a serious process. Continue Furosemide 20 mg daily, take an extra one around noon of swelling gets worse. If problem re-occurs with pain or redness, may need to go to the ED. Continue wearing compression stockings.  Boil tabs of Alprazolam and discard. Continue Lorazepam and resume Celexa. Keep appt with pulmonologist 08/20/22.  If you need refills for medications you take chronically, please call your pharmacy. Do not use My Chart to request refills or for acute issues that need immediate attention. If you send a my chart message, it may take a few days to be addressed, specially if I am not in the office.  Please be sure medication list is accurate. If a new problem present, please set up appointment sooner than planned today.

## 2022-07-13 NOTE — Progress Notes (Unsigned)
ACUTE VISIT Chief Complaint  Patient presents with   Leg Swelling   HPI: [name] is a 81 y.o. male with medical history significant for OSA, CAD, abdominal aortic aneurysm, hypertension, prostate cancer, and sick sinus syndrome status post pacemaker placement here today with his wife complaining of a day or so of unilateral left leg swelling, which he noticed about a week ago. The swelling is unusual as he typically experiences bilateral leg swelling.  He reports that the swelling improved in the morning and denies any associated pain or redness in the calf. Lower extremity edema greatly improved after wearing compression stockings. He denies any recent increase in salt intake and reports that his blood pressure is well-controlled. He is currently on metoprolol titrate 25 mg twice daily.  He has not experienced any difficulty breathing, PND, orthopnea, palpitations, decreased urine output, foamy urine, or confusion. He is currently on furosemide 20 mg daily. He has gradually increased physical activity, he is now able to walk to his mailbox and to his neighbor's house.  His wife also reports poor sleep quality due to nighttime coughing.  He takes benzonatate 100 mg at bedtime, which has helped. Last visit he reported improvement of cough with Spiriva Respimat 2.5 mcg 2 puffs daily. He follows with pulmonologist. In regard to his anxiety, his wife states that he has been more anxious lately. He has been taking lorazepam, which was discontinue after last visit because of shortage.  His pharmacy filled Lorazepam refill he had left from last Rx. He denies being taking both, he stopped Alprazolam. Also he states that he has not taken Celexa for a month , his pharmacy has not filled Rx, she has refills until 04/2023.  Lastly, the patient's wife reports concerns about his hearing, as he has been talking and playing TV louder and having difficulty understanding others.She believes  that his hearing has worsened over time. He does not think he has a hearing problem.  Review of Systems  Constitutional:  Positive for fatigue. Negative for activity change, appetite change and fever.  HENT:  Negative for ear discharge, ear pain and sore throat.   Eyes:  Negative for redness and visual disturbance.  Respiratory:  Positive for cough. Negative for chest tightness.   Gastrointestinal:  Negative for abdominal pain, nausea and vomiting.  Endocrine: Negative for cold intolerance and heat intolerance.  Genitourinary:  Negative for dysuria and hematuria.  Musculoskeletal:  Positive for arthralgias and gait problem.  Skin:  Negative for rash.  Neurological:  Negative for syncope, weakness and headaches.  Psychiatric/Behavioral:  Positive for sleep disturbance. Negative for confusion. The patient is nervous/anxious.   Rest see pertinent positives and negatives per HPI.  Current Outpatient Medications on File Prior to Visit  Medication Sig Dispense Refill   acetaminophen (TYLENOL) 500 MG tablet Take 1,000 mg by mouth every 6 (six) hours as needed for headache or moderate pain (pain).     albuterol (VENTOLIN HFA) 108 (90 Base) MCG/ACT inhaler TAKE 2 PUFFS BY MOUTH EVERY 6 HOURS AS NEEDED FOR WHEEZE OR SHORTNESS OF BREATH 18 each 12   aspirin EC 81 MG tablet Take 81 mg by mouth daily.     atorvastatin (LIPITOR) 20 MG tablet Take 1 tablet (20 mg total) by mouth daily. 90 tablet 1   benzonatate (TESSALON) 100 MG capsule Take 1 capsule (100 mg total) by mouth daily as needed. 30 capsule 1   budesonide-formoterol (SYMBICORT) 160-4.5 MCG/ACT inhaler Inhale 2 puffs then rinse mouth,  twice daily (Patient taking differently: Inhale 2 puffs into the lungs 2 (two) times daily. Inhale 2 puffs then rinse mouth, twice daily) 1 each 12   diclofenac Sodium (VOLTAREN) 1 % GEL AAPLY 4 GRAMS TOPICALLY 4 TIMES DAILY (Patient taking differently: Apply 1 Application topically 4 (four) times daily as needed  (pain).) 400 g 2   enzalutamide (XTANDI) 40 MG tablet Take 4 tablets (160 mg total) by mouth daily. 120 tablet 3   fluticasone (FLONASE) 50 MCG/ACT nasal spray PLACE 1 SPRAY INTO BOTH NOSTRILS DAILY AS NEEDED FOR ALLERGIES OR RHINITIS (AT BEDTIME). 48 mL 1   furosemide (LASIX) 20 MG tablet Take 1 tablet (20 mg total) by mouth daily. 90 tablet 1   magnesium hydroxide (MILK OF MAGNESIA) 400 MG/5ML suspension Take by mouth daily as needed for mild constipation.     Melatonin 10 MG CAPS Take 20 mg by mouth at bedtime.     metoprolol tartrate (LOPRESSOR) 25 MG tablet Take 1 tablet (25 mg total) by mouth 2 (two) times daily. 180 tablet 2   Multiple Vitamin (MULTIVITAMIN WITH MINERALS) TABS tablet Take 0.5 tablets by mouth daily.     omeprazole (PRILOSEC) 20 MG capsule TAKE 1 CAPSULE BY MOUTH DAILY BEFORE BREAKFAST. 90 capsule 3   Tiotropium Bromide Monohydrate (SPIRIVA RESPIMAT) 2.5 MCG/ACT AERS Inhale 2 puffs into the lungs daily. 2 each 0   No current facility-administered medications on file prior to visit.   Past Medical History:  Diagnosis Date   Anxiety    COPD (chronic obstructive pulmonary disease) ([place])    Coronary artery disease    Dyspnea    Gastric outlet obstruction 02/03/2018   [name] 02/03/2018   GERD (gastroesophageal reflux disease)    History of hiatal hernia    small/notes 02/03/2018   Hypertension    no meds   Presence of permanent cardiac pacemaker    Prostate cancer ([place]) 02/08/2014   Gleason 4+5=9, PSA 15.65   Radiation    Sinus problem    SSS (sick sinus syndrome) ([facility])    Allergies  Allergen Reactions   Penicillins Rash    Has patient had a PCN reaction causing immediate rash, facial/tongue/throat swelling, SOB or lightheadedness with hypotension: Yes Has patient had a PCN reaction causing severe rash involving mucus membranes or skin necrosis: No Has patient had a PCN reaction that required hospitalization: No Has patient had a PCN reaction occurring within  the last 10 years: No If all of the above answers are "NO", then may proceed with Cephalosporin use.   Social History   Socioeconomic History   Marital status: Married    Spouse name: Not on file   Number of children: 3   Years of education: Not on file   Highest education level: Not on file  Occupational History   Occupation: Freight forwarder    Employer: [employer]    Comment: retired   Occupation: Freight forwarder    Comment: [employer]-retired  Tobacco Use   Smoking status: Former    Types: Cigars    Quit date: 09/14/2012    Years since quitting: 9.8   Smokeless tobacco: Never   Tobacco comments:    little cigars; the small ones"  Vaping Use   Vaping Use: Never used  Substance and Sexual Activity   Alcohol use: Not Currently    Alcohol/week: 14.0 standard drinks of alcohol    Types: 14 Cans of beer per week   Drug use: No   Sexual activity: Not Currently  Other Topics Concern   Not on file  Social History Narrative   Lives with wife and 2 dogs in one level home; daughter and her family co-habitate.   Has three daughters, all in [place], supportive. Five grandchildren.   Wants to return to doing silver sneakers once gym re-opens.         Social Determinants of Health   Financial Resource Strain: Medium Risk (05/27/2022)   Overall Financial Resource Strain (CARDIA)    Difficulty of Paying Living Expenses: Somewhat hard  Food Insecurity: No Food Insecurity (07/22/2021)   Hunger Vital Sign    Worried About Running Out of Food in the Last Year: Never true    Ran Out of Food in the Last Year: Never true  Transportation Needs: No Transportation Needs (07/22/2021)   PRAPARE - Hydrologist (Medical): No    Lack of Transportation (Non-Medical): No  Physical Activity: Insufficiently Active (07/22/2021)   Exercise Vital Sign    Days of Exercise per Week: 5 days    Minutes of Exercise per Session: 20 min  Stress: No Stress Concern Present (07/22/2021)   [place]    Feeling of Stress : Not at all  Social Connections: Moderately Integrated (07/22/2021)   Social Connection and Isolation Panel [NHANES]    Frequency of Communication with Friends and Family: More than three times a week    Frequency of Social Gatherings with Friends and Family: More than three times a week    Attends Religious Services: More than 4 times per year    Active Member of [employer] or Organizations: No    Attends Archivist Meetings: Not on file    Marital Status: Married   Vitals:   07/13/22 0848  BP: 124/80  Pulse: 90  Resp: 16  Temp: 97.7 F (36.5 C)  SpO2: 92%  Body mass index is 31.7 kg/m.  Physical Exam Vitals and nursing note reviewed.  Constitutional:      General: He is not in acute distress.    Appearance: He is well-developed and well-groomed.  HENT:     Head: Normocephalic and atraumatic.     Right Ear: Tympanic membrane, ear canal and external ear normal.     Left Ear: Tympanic membrane, ear canal and external ear normal.     Mouth/Throat:     Mouth: Mucous membranes are moist.  Eyes:     Conjunctiva/sclera: Conjunctivae normal.  Cardiovascular:     Rate and Rhythm: Normal rate. Rhythm irregular.     Heart sounds: No murmur heard.    Comments: DP pulses present. LLE: No calf pain with palpation of foot dorsi flexion, no erythema or local heat. Varicose veins LE, bilateral. Pulmonary:     Effort: Pulmonary effort is normal. No respiratory distress.     Breath sounds: Normal breath sounds.  Musculoskeletal:     Right lower leg: No edema.     Left lower leg: No edema.  Skin:    General: Skin is warm.     Findings: No erythema or rash.  Neurological:     Mental Status: He is alert and oriented to person, place, and time.     Cranial Nerves: No cranial nerve deficit.     Comments: Gait assisted with a cane.  Psychiatric:        Mood and Affect: Affect normal. Mood is  anxious.   ASSESSMENT AND PLAN:  Mr.Broden was seen  today for leg swelling.  Diagnoses and all orders for this visit:  Edema of left lower extremity We discussed possible etiologies, history and examination today do not suggest a serious process or like DVT. We discussed options, including having a left vascular US to evaluate for DVT but left lower extremity edema has resolved, so he agrees on holding on future work-up. Lower extremity elevation above heart level and compression stockings will help. Continue furosemide 20 mg daily, he was instructed to take an extra 1 around noon if worsening lower extremity edema.  We discussed some side effects of medication. He was clearly instructed about warning signs.  Other specified anxiety disorders His wife is reporting anxiety is getting worse. He has not taken Celexa for a month, instructed to resume at the same dose. Now he is taking lorazepam 1 mg bid prn, so instructed to discard alprazolam. Some side effects of medications discussed. -     citalopram (CELEXA) 10 MG tablet; Take 1 tablet (10 mg total) by mouth daily. -     ALPRAZolam (XANAX) 1 MG tablet; Take 0.5-1 tablets (0.5-1 mg total) by mouth 2 (two) times daily as needed for up to 1 day for anxiety.  Bilateral hearing loss, unspecified hearing loss type He does not think he has a hearing problem, his wife is concerned. Today hearing test otherwise normal.  Hearing Screening   '500Hz'$  '1000Hz'$  '2000Hz'$  '4000Hz'$   Right ear Fail Pass Pass Pass  Left ear Pass Pass Pass Pass   Return if symptoms worsen or fail to improve, for Keep next appt.  Nahara Dona G. [name], MD  [facility]. [place] office.

## 2022-07-15 ENCOUNTER — Other Ambulatory Visit: Payer: Self-pay | Admitting: Internal Medicine

## 2022-07-15 DIAGNOSIS — J439 Emphysema, unspecified: Secondary | ICD-10-CM

## 2022-07-15 NOTE — Telephone Encounter (Signed)
Ventolin refilled ?

## 2022-07-16 ENCOUNTER — Telehealth: Payer: Self-pay | Admitting: Pharmacy Technician

## 2022-07-16 DIAGNOSIS — C61 Malignant neoplasm of prostate: Secondary | ICD-10-CM

## 2022-07-16 MED ORDER — ENZALUTAMIDE 40 MG PO TABS: 160.0000 mg | ORAL_TABLET | Freq: Every day | ORAL | 3 refills | Status: DC

## 2022-07-16 NOTE — Telephone Encounter (Signed)
Ms. Tony [name] requested following r/t patient med assistance:  patient needs an erx for their Xtandi sent to [facility] ( for re-enrollment in assistance).   E-Rx sent to [facility] as requested

## 2022-07-16 NOTE — Telephone Encounter (Signed)
Oral Oncology Patient Advocate Encounter   Received notification that patient is due for re-enrollment for assistance for Xtandi through [employer].   Re-enrollment requires new rx to be sent to [facility]. This process has been initiated.   Re-enrollment key: [place] phone number [phone].   I will continue to follow until final determination.   Lady Deutscher, CPhT-Adv Oncology Pharmacy Patient [place] Direct Number: [phone]  Fax: [phone]

## 2022-07-17 ENCOUNTER — Inpatient Hospital Stay: Payer: Medicare Other | Attending: Oncology

## 2022-07-17 ENCOUNTER — Other Ambulatory Visit: Payer: Self-pay

## 2022-07-17 ENCOUNTER — Inpatient Hospital Stay: Payer: Medicare Other | Admitting: Oncology

## 2022-07-17 ENCOUNTER — Inpatient Hospital Stay: Payer: Medicare Other

## 2022-07-17 VITALS — BP 116/81 | HR 70 | Temp 98.0°F | Resp 16 | Wt 207.0 lb

## 2022-07-17 DIAGNOSIS — Z5111 Encounter for antineoplastic chemotherapy: Secondary | ICD-10-CM | POA: Diagnosis not present

## 2022-07-17 DIAGNOSIS — C61 Malignant neoplasm of prostate: Secondary | ICD-10-CM | POA: Insufficient documentation

## 2022-07-17 DIAGNOSIS — E669 Obesity, unspecified: Secondary | ICD-10-CM

## 2022-07-17 DIAGNOSIS — I10 Essential (primary) hypertension: Secondary | ICD-10-CM | POA: Insufficient documentation

## 2022-07-17 DIAGNOSIS — E876 Hypokalemia: Secondary | ICD-10-CM | POA: Insufficient documentation

## 2022-07-17 LAB — CMP (CANCER CENTER ONLY)
ALT: 12 U/L (ref 0–44)
AST: 24 U/L (ref 15–41)
Albumin: 4 g/dL (ref 3.5–5.0)
Alkaline Phosphatase: 89 U/L (ref 38–126)
Anion gap: 8 (ref 5–15)
BUN: 9 mg/dL (ref 8–23)
CO2: 28 mmol/L (ref 22–32)
Calcium: 9.2 mg/dL (ref 8.9–10.3)
Chloride: 105 mmol/L (ref 98–111)
Creatinine: 0.9 mg/dL (ref 0.61–1.24)
GFR, Estimated: >60 mL/min (ref >60)
Glucose, Bld: 109 mg/dL — ABNORMAL HIGH (ref 70–99)
Potassium: 3.5 mmol/L (ref 3.5–5.1)
Sodium: 141 mmol/L (ref 135–145)
Total Bilirubin: 1.2 mg/dL (ref 0.3–1.2)
Total Protein: 6.6 g/dL (ref 6.5–8.1)

## 2022-07-17 LAB — CBC WITH DIFFERENTIAL (CANCER CENTER ONLY)
Abs Immature Granulocytes: 0.02 10*3/uL (ref 0.00–0.07)
Basophils Absolute: 0.1 10*3/uL (ref 0.0–0.1)
Basophils Relative: 1 %
Eosinophils Absolute: 0.3 10*3/uL (ref 0.0–0.5)
Eosinophils Relative: 4 %
HCT: 40 % (ref 39.0–52.0)
Hemoglobin: 13.4 g/dL (ref 13.0–17.0)
Immature Granulocytes: 0 %
Lymphocytes Relative: 15 %
Lymphs Abs: 1.1 10*3/uL (ref 0.7–4.0)
MCH: 30.7 pg (ref 26.0–34.0)
MCHC: 33.5 g/dL (ref 30.0–36.0)
MCV: 91.5 fL (ref 80.0–100.0)
Monocytes Absolute: 0.7 10*3/uL (ref 0.1–1.0)
Monocytes Relative: 10 %
Neutro Abs: 4.9 10*3/uL (ref 1.7–7.7)
Neutrophils Relative %: 70 %
Platelet Count: 223 10*3/uL (ref 150–400)
RBC: 4.37 MIL/uL (ref 4.22–5.81)
RDW: 14.3 % (ref 11.5–15.5)
WBC Count: 7 10*3/uL (ref 4.0–10.5)
nRBC: 0 % (ref 0.0–0.2)

## 2022-07-17 MED ORDER — LEUPROLIDE ACETATE (3 MONTH) 22.5 MG ~~LOC~~ KIT
22.5000 mg | PACK | Freq: Once | SUBCUTANEOUS | Status: AC
  Administered 2022-07-17: 22.5 mg via SUBCUTANEOUS
  Filled 2022-07-17: qty 22.5

## 2022-07-17 NOTE — Progress Notes (Signed)
Hematology and Oncology Follow Up Visit  Tony [name] [id number] 01/24/1941 81 y.o. 07/17/2022 8:33 AM Tony, Betty [name], [name], Tony So, MD   Principle Diagnosis: 81 year old man with advanced prostate cancer with lymphadenopathy diagnosed in 2017.  He has castration-sensitive disease after presenting with Gleason score 4+5 = 9 and a PSA 15.7 localized disease at the time of presentation.   Prior Therapy:  He is status post radiation therapy for external beam radiation completed in September 2015 for a total of 45 gray. He also status post seed implant brachytherapy boost completed in October 2015.   He received total of 1 year of androgen deprivation 22,015 and 2016. His PSA nadir was to 0.47 in November 2016. His PSA was 4.08 in May 2017.  Zytiga 1000 mg with prednisone started in August 2017.  Therapy discontinued in October 2023 because of insurance coverage.  Current therapy:   Eligard 22.5 mg every 3 months.  Last injection was given in August 2023 and will be repeated today.  Xtandi 160 mg daily started on June 15, 2022.   Interim History: Mr. Beigel is here for return evaluation.  Since the last visit, he started [facility] and has tolerated it without any major complaints.  Due to insurance purposes he was not able to get Zytiga anymore.  Discontinuation of prednisone made him feel better actually and lost some weight.  He is ambulating without any major difficulties.  He denies any nausea vomiting or abdominal discomfort.  He has reported some hot flashes but no other issues.        Medications: Reviewed without changes. Current Outpatient Medications  Medication Sig Dispense Refill   acetaminophen (TYLENOL) 500 MG tablet Take 1,000 mg by mouth every 6 (six) hours as needed for headache or moderate pain (pain).     albuterol (VENTOLIN HFA) 108 (90 Base) MCG/ACT inhaler TAKE 2 PUFFS BY MOUTH EVERY 6 HOURS AS NEEDED FOR WHEEZE OR SHORTNESS OF BREATH 8.5 each 12    aspirin EC 81 MG tablet Take 81 mg by mouth daily.     atorvastatin (LIPITOR) 20 MG tablet Take 1 tablet (20 mg total) by mouth daily. 90 tablet 1   benzonatate (TESSALON) 100 MG capsule Take 1 capsule (100 mg total) by mouth daily as needed. 30 capsule 1   budesonide-formoterol (SYMBICORT) 160-4.5 MCG/ACT inhaler Inhale 2 puffs then rinse mouth, twice daily (Patient taking differently: Inhale 2 puffs into the lungs 2 (two) times daily. Inhale 2 puffs then rinse mouth, twice daily) 1 each 12   citalopram (CELEXA) 10 MG tablet Take 1 tablet (10 mg total) by mouth daily. 90 tablet 3   diclofenac Sodium (VOLTAREN) 1 % GEL AAPLY 4 GRAMS TOPICALLY 4 TIMES DAILY (Patient taking differently: Apply 1 Application topically 4 (four) times daily as needed (pain).) 400 [name] 2   enzalutamide (XTANDI) 40 MG tablet Take 4 tablets (160 mg total) by mouth daily. 120 tablet 3   fluticasone (FLONASE) 50 MCG/ACT nasal spray PLACE 1 SPRAY INTO BOTH NOSTRILS DAILY AS NEEDED FOR ALLERGIES OR RHINITIS (AT BEDTIME). 48 mL 1   furosemide (LASIX) 20 MG tablet Take 1 tablet (20 mg total) by mouth daily. 90 tablet 1   magnesium hydroxide (MILK OF MAGNESIA) 400 MG/5ML suspension Take by mouth daily as needed for mild constipation.     Melatonin 10 MG CAPS Take 20 mg by mouth at bedtime.     metoprolol tartrate (LOPRESSOR) 25 MG tablet Take 1 tablet (25 mg total) by  mouth 2 (two) times daily. 180 tablet 2   Multiple Vitamin (MULTIVITAMIN WITH MINERALS) TABS tablet Take 0.5 tablets by mouth daily.     omeprazole (PRILOSEC) 20 MG capsule TAKE 1 CAPSULE BY MOUTH DAILY BEFORE BREAKFAST. 90 capsule 3   Tiotropium Bromide Monohydrate (SPIRIVA RESPIMAT) 2.5 MCG/ACT AERS Inhale 2 puffs into the lungs daily. 2 each 0   No current facility-administered medications for this visit.     Allergies:  Allergies  Allergen Reactions   Penicillins Rash    Has patient had a PCN reaction causing immediate rash, facial/tongue/throat swelling, SOB  or lightheadedness with hypotension: Yes Has patient had a PCN reaction causing severe rash involving mucus membranes or skin necrosis: No Has patient had a PCN reaction that required hospitalization: No Has patient had a PCN reaction occurring within the last 10 years: No If all of the above answers are "NO", then may proceed with Cephalosporin use.      Physical Exam:       Blood pressure 116/81, pulse 70, temperature 98 F (36.7 C), temperature source Oral, resp. rate 16, weight 207 lb (93.9 kg), SpO2 95 %.        ECOG: 1    General appearance: Comfortable appearing without any discomfort Head: Normocephalic without any trauma Oropharynx: Mucous membranes are moist and pink without any thrush or ulcers. Eyes: Pupils are equal and round reactive to light. Lymph nodes: No cervical, supraclavicular, inguinal or axillary lymphadenopathy.   Heart:regular rate and rhythm.  S1 and S2 without leg edema. Lung: Clear without any rhonchi or wheezes.  No dullness to percussion. Abdomin: Soft, nontender, nondistended with good bowel sounds.  No hepatosplenomegaly. Musculoskeletal: No joint deformity or effusion.  Full range of motion noted. Neurological: No deficits noted on motor, sensory and deep tendon reflex exam. Skin: No petechial rash or dryness.  Appeared moist.                   Lab Results: Lab Results  Component Value Date   WBC 6.8 05/15/2022   HGB 12.9 (L) 05/15/2022   HCT 39.2 05/15/2022   MCV 92.9 05/15/2022   PLT 191 05/15/2022     Chemistry      Component Value Date/Time   NA 137 04/17/2022 1006   NA 139 02/03/2019 0857   NA 141 08/27/2017 0915   K 4.7 04/17/2022 1006   K 4.8 08/27/2017 0915   CL 103 04/17/2022 1006   CO2 27 04/17/2022 1006   CO2 26 08/27/2017 0915   BUN 16 04/17/2022 1006   BUN 13 02/03/2019 0857   BUN 16.2 08/27/2017 0915   CREATININE 1.16 05/15/2022 1322   CREATININE 1.09 04/17/2022 1006   CREATININE 1.07  07/31/2020 1325   CREATININE 0.9 08/27/2017 0915   GLU 105 01/10/2016 0000      Component Value Date/Time   CALCIUM 9.0 04/17/2022 1006   CALCIUM 9.8 08/27/2017 0915   ALKPHOS 84 04/17/2022 1006   ALKPHOS 83 08/27/2017 0915   AST 18 04/17/2022 1006   AST 23 08/27/2017 0915   ALT 15 04/17/2022 1006   ALT 17 08/27/2017 0915   BILITOT 0.9 04/17/2022 1006   BILITOT 0.95 08/27/2017 0915          Latest Reference Range & Units 10/03/21 08:12 01/16/22 11:30 04/17/22 10:06  Prostate Specific Ag, Serum 0.0 - 4.0 ng/mL <0.1 <0.1 <0.1     Impression and Plan:   81 year old man with:  1.  Advanced prostate cancer  with lymphadenopathy diagnosed in 2017.  He has castration-sensitive disease.  He continues to tolerate Xtandi with excellent PSA response that is currently undetectable.  Risks and benefits of continuing this treatment long-term were discussed.  Hypertension, fatigue and hot flashes were reiterated.  He has not experienced major complaints at this time and his PSA remains undetectable.  Different salvage therapy options such as Taxotere chemotherapy, PARP inhibitor among others could be considered if he has advanced disease in the future.   3. Hypokalemia: This should be less of an issue on Xtandi versus Zytiga.  His potassium has been within normal range.   4. Hypertension: His blood pressure is within normal range.  5.  Prognosis and goals of care: Therapy remains palliative although aggressive measures are warranted at this time.  6.  Androgen deprivation therapy: He is only on Eligard which will be repeated today and every 3 months indefinitely.   7. Follow-up: In 3 months for repeat evaluation.   30  minutes were dedicated to this encounter.  Time was spent on reviewing laboratory data, disease status update and outlining future plan of care discussion. Zola Button, MD 11/3/20238:33 AM

## 2022-07-18 LAB — PROSTATE-SPECIFIC AG, SERUM (LABCORP): Prostate Specific Ag, Serum: <0.1 ng/mL (ref 0.0–4.0)

## 2022-07-20 NOTE — Telephone Encounter (Signed)
Oral Oncology Patient Advocate Encounter   Received notification re-enrollment for assistance for Xtandi through [employer] has been approved. Patient may continue to receive their medication at $0 from this program.    [employer] phone number [phone].   Effective dates: 09/14/22 through 09/14/23  I have spoken to the patient.  Lady Deutscher, CPhT-Adv Oncology Pharmacy Patient [place] Direct Number: [phone]  Fax: [phone]

## 2022-07-24 ENCOUNTER — Other Ambulatory Visit: Payer: Self-pay | Admitting: Family Medicine

## 2022-07-24 DIAGNOSIS — I10 Essential (primary) hypertension: Secondary | ICD-10-CM

## 2022-07-24 DIAGNOSIS — R600 Localized edema: Secondary | ICD-10-CM

## 2022-07-27 NOTE — Telephone Encounter (Signed)
I thought this medication was d/c during his last hospitalization. Is he still taking it? Thanks, BJ

## 2022-07-29 ENCOUNTER — Ambulatory Visit (INDEPENDENT_AMBULATORY_CARE_PROVIDER_SITE_OTHER): Payer: Medicare Other

## 2022-07-29 VITALS — Ht 68.0 in | Wt 207.0 lb

## 2022-07-29 DIAGNOSIS — Z0000 Encounter for general adult medical examination without abnormal findings: Secondary | ICD-10-CM

## 2022-07-29 NOTE — Patient Instructions (Addendum)
Tony [name] , Thank you for taking time to come for your Medicare Wellness Visit. I appreciate your ongoing commitment to your health goals. Please review the following plan we discussed and let me know if I can assist you in the future.   These are the goals we discussed:  Goals       get well and walk better      No current goals (pt-stated)      Patient Stated      Get well and maintain health.      Patient Stated      Re-start silver sneakers, walking outside.  Start a new hobby?        This is a list of the screening recommended for you and due dates:  Health Maintenance  Topic Date Due   COVID-19 Vaccine (4 - Pfizer risk series) 09/17/2022*   Zoster (Shingles) Vaccine (1 of 2) 09/18/2022*   Medicare Annual Wellness Visit  07/30/2023   Tetanus Vaccine  02/12/2029   Pneumonia Vaccine  Completed   Flu Shot  Completed   HPV Vaccine  Aged Out  *Topic was postponed. The date shown is not the original due date.    Advanced directives: Advance directive discussed with you today. Even though you declined this today, please call our office should you change your mind, and we can give you the proper paperwork for you to fill out.   Conditions/risks identified: None  Next appointment: Follow up in one year for your annual wellness visit.    Preventive Care 62 Years and Older, Male  Preventive care refers to lifestyle choices and visits with your health care provider that can promote health and wellness. What does preventive care include? A yearly physical exam. This is also called an annual well check. Dental exams once or twice a year. Routine eye exams. Ask your health care provider how often you should have your eyes checked. Personal lifestyle choices, including: Daily care of your teeth and gums. Regular physical activity. Eating a healthy diet. Avoiding tobacco and drug use. Limiting alcohol use. Practicing safe sex. Taking low doses of aspirin every day. Taking  vitamin and mineral supplements as recommended by your health care provider. What happens during an annual well check? The services and screenings done by your health care provider during your annual well check will depend on your age, overall health, lifestyle risk factors, and family history of disease. Counseling  Your health care provider may ask you questions about your: Alcohol use. Tobacco use. Drug use. Emotional well-being. Home and relationship well-being. Sexual activity. Eating habits. History of falls. Memory and ability to understand (cognition). Work and work Statistician. Screening  You may have the following tests or measurements: Height, weight, and BMI. Blood pressure. Lipid and cholesterol levels. These may be checked every 5 years, or more frequently if you are over 45 years old. Skin check. Lung cancer screening. You may have this screening every year starting at age 42 if you have a 30-pack-year history of smoking and currently smoke or have quit within the past 15 years. Fecal occult blood test (FOBT) of the stool. You may have this test every year starting at age 79. Flexible sigmoidoscopy or colonoscopy. You may have a sigmoidoscopy every 5 years or a colonoscopy every 10 years starting at age 70. Prostate cancer screening. Recommendations will vary depending on your family history and other risks. Hepatitis C blood test. Hepatitis B blood test. Sexually transmitted disease (STD) testing. Diabetes screening.  This is done by checking your blood sugar (glucose) after you have not eaten for a while (fasting). You may have this done every 1-3 years. Abdominal aortic aneurysm (AAA) screening. You may need this if you are a current or former smoker. Osteoporosis. You may be screened starting at age 74 if you are at high risk. Talk with your health care provider about your test results, treatment options, and if necessary, the need for more tests. Vaccines  Your  health care provider may recommend certain vaccines, such as: Influenza vaccine. This is recommended every year. Tetanus, diphtheria, and acellular pertussis (Tdap, Td) vaccine. You may need a Td booster every 10 years. Zoster vaccine. You may need this after age 72. Pneumococcal 13-valent conjugate (PCV13) vaccine. One dose is recommended after age 91. Pneumococcal polysaccharide (PPSV23) vaccine. One dose is recommended after age 79. Talk to your health care provider about which screenings and vaccines you need and how often you need them. This information is not intended to replace advice given to you by your health care provider. Make sure you discuss any questions you have with your health care provider. Document Released: 09/27/2015 Document Revised: 05/20/2016 Document Reviewed: 07/02/2015 Elsevier Interactive Patient Education  2017 [place] Prevention in the Home Falls can cause injuries. They can happen to people of all ages. There are many things you can do to make your home safe and to help prevent falls. What can I do on the outside of my home? Regularly fix the edges of walkways and driveways and fix any cracks. Remove anything that might make you trip as you walk through a door, such as a raised step or threshold. Trim any bushes or trees on the path to your home. Use bright outdoor lighting. Clear any walking paths of anything that might make someone trip, such as rocks or tools. Regularly check to see if handrails are loose or broken. Make sure that both sides of any steps have handrails. Any raised decks and porches should have guardrails on the edges. Have any leaves, snow, or ice cleared regularly. Use sand or salt on walking paths during winter. Clean up any spills in your garage right away. This includes oil or grease spills. What can I do in the bathroom? Use night lights. Install grab bars by the toilet and in the tub and shower. Do not use towel bars as  grab bars. Use non-skid mats or decals in the tub or shower. If you need to sit down in the shower, use a plastic, non-slip stool. Keep the floor dry. Clean up any water that spills on the floor as soon as it happens. Remove soap buildup in the tub or shower regularly. Attach bath mats securely with double-sided non-slip rug tape. Do not have throw rugs and other things on the floor that can make you trip. What can I do in the bedroom? Use night lights. Make sure that you have a light by your bed that is easy to reach. Do not use any sheets or blankets that are too big for your bed. They should not hang down onto the floor. Have a firm chair that has side arms. You can use this for support while you get dressed. Do not have throw rugs and other things on the floor that can make you trip. What can I do in the kitchen? Clean up any spills right away. Avoid walking on wet floors. Keep items that you use a lot in easy-to-reach places. If  you need to reach something above you, use a strong step stool that has a grab bar. Keep electrical cords out of the way. Do not use floor polish or wax that makes floors slippery. If you must use wax, use non-skid floor wax. Do not have throw rugs and other things on the floor that can make you trip. What can I do with my stairs? Do not leave any items on the stairs. Make sure that there are handrails on both sides of the stairs and use them. Fix handrails that are broken or loose. Make sure that handrails are as long as the stairways. Check any carpeting to make sure that it is firmly attached to the stairs. Fix any carpet that is loose or worn. Avoid having throw rugs at the top or bottom of the stairs. If you do have throw rugs, attach them to the floor with carpet tape. Make sure that you have a light switch at the top of the stairs and the bottom of the stairs. If you do not have them, ask someone to add them for you. What else can I do to help prevent  falls? Wear shoes that: Do not have high heels. Have rubber bottoms. Are comfortable and fit you well. Are closed at the toe. Do not wear sandals. If you use a stepladder: Make sure that it is fully opened. Do not climb a closed stepladder. Make sure that both sides of the stepladder are locked into place. Ask someone to hold it for you, if possible. Clearly mark and make sure that you can see: Any grab bars or handrails. First and last steps. Where the edge of each step is. Use tools that help you move around (mobility aids) if they are needed. These include: Canes. Walkers. Scooters. Crutches. Turn on the lights when you go into a dark area. Replace any light bulbs as soon as they burn out. Set up your furniture so you have a clear path. Avoid moving your furniture around. If any of your floors are uneven, fix them. If there are any pets around you, be aware of where they are. Review your medicines with your doctor. Some medicines can make you feel dizzy. This can increase your chance of falling. Ask your doctor what other things that you can do to help prevent falls. This information is not intended to replace advice given to you by your health care provider. Make sure you discuss any questions you have with your health care provider. Document Released: 06/27/2009 Document Revised: 02/06/2016 Document Reviewed: 10/05/2014 Elsevier Interactive Patient Education  2017 [employer].

## 2022-07-29 NOTE — Progress Notes (Signed)
Subjective:   Tony [name] is a 81 y.o. male who presents for Medicare Annual/Subsequent preventive examination.  Review of Systems    Virtual Visit via Telephone Note  I connected with  Tony [name] on 07/29/22 at 10:30 AM EST by telephone and verified that I am speaking with the correct person using two identifiers.  Location: Patient: Home Provider: Office Persons participating in the virtual visit: patient/Nurse Health Advisor   I discussed the limitations, risks, security and privacy concerns of performing an evaluation and management service by telephone and the availability of in person appointments. The patient expressed understanding and agreed to proceed.  Interactive audio and video telecommunications were attempted between this nurse and patient, however failed, due to patient having technical difficulties OR patient did not have access to video capability.  We continued and completed visit with audio only.  Some vital signs may be absent or patient reported.   Criselda Peaches, LPN  Cardiac Risk Factors include: advanced age (>105mn, >>58women);hypertension;male gender     Objective:    Today's Vitals   07/29/22 1025  Weight: 207 lb (93.9 kg)  Height: '5\' 8"'$  (1.727 m)   Body mass index is 31.47 kg/m.     07/29/2022   10:36 AM 05/05/2022    8:28 AM 12/27/2021    3:00 PM 12/24/2021   10:56 AM 09/27/2021    8:54 AM 09/19/2021    8:58 AM 07/22/2021    3:06 PM  Advanced Directives  Does Patient Have a Medical Advance Directive? No No Yes No No No No  Type of Advance Directive   Living will      Does patient want to make changes to medical advance directive?   No - Patient declined      Would patient like information on creating a medical advance directive? No - Patient declined    No - Patient declined  No - Patient declined    Current Medications (verified) Outpatient Encounter Medications as of 07/29/2022  Medication Sig   acetaminophen  (TYLENOL) 500 MG tablet Take 1,000 mg by mouth every 6 (six) hours as needed for headache or moderate pain (pain).   albuterol (VENTOLIN HFA) 108 (90 Base) MCG/ACT inhaler TAKE 2 PUFFS BY MOUTH EVERY 6 HOURS AS NEEDED FOR WHEEZE OR SHORTNESS OF BREATH   aspirin EC 81 MG tablet Take 81 mg by mouth daily.   atorvastatin (LIPITOR) 20 MG tablet Take 1 tablet (20 mg total) by mouth daily.   benzonatate (TESSALON) 100 MG capsule Take 1 capsule (100 mg total) by mouth daily as needed.   budesonide-formoterol (SYMBICORT) 160-4.5 MCG/ACT inhaler Inhale 2 puffs then rinse mouth, twice daily (Patient taking differently: Inhale 2 puffs into the lungs 2 (two) times daily. Inhale 2 puffs then rinse mouth, twice daily)   citalopram (CELEXA) 10 MG tablet Take 1 tablet (10 mg total) by mouth daily.   diclofenac Sodium (VOLTAREN) 1 % GEL AAPLY 4 GRAMS TOPICALLY 4 TIMES DAILY (Patient taking differently: Apply 1 Application topically 4 (four) times daily as needed (pain).)   enzalutamide (XTANDI) 40 MG tablet Take 4 tablets (160 mg total) by mouth daily.   fluticasone (FLONASE) 50 MCG/ACT nasal spray PLACE 1 SPRAY INTO BOTH NOSTRILS DAILY AS NEEDED FOR ALLERGIES OR RHINITIS (AT BEDTIME).   furosemide (LASIX) 20 MG tablet TAKE 1 TABLET BY MOUTH DAILY   magnesium hydroxide (MILK OF MAGNESIA) 400 MG/5ML suspension Take by mouth daily as needed for mild constipation.  Melatonin 10 MG CAPS Take 20 mg by mouth at bedtime.   metoprolol tartrate (LOPRESSOR) 25 MG tablet Take 1 tablet (25 mg total) by mouth 2 (two) times daily.   Multiple Vitamin (MULTIVITAMIN WITH MINERALS) TABS tablet Take 0.5 tablets by mouth daily.   omeprazole (PRILOSEC) 20 MG capsule TAKE 1 CAPSULE BY MOUTH DAILY BEFORE BREAKFAST.   Tiotropium Bromide Monohydrate (SPIRIVA RESPIMAT) 2.5 MCG/ACT AERS Inhale 2 puffs into the lungs daily.   No facility-administered encounter medications on file as of 07/29/2022.    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, PSA 15.65   Radiation    Sinus problem    SSS (sick sinus syndrome) ([place])    Past Surgical History:  Procedure Laterality Date   APPENDECTOMY  2019   hx hematoma s/p appendectomy   CHOLECYSTECTOMY N/A 05/15/2022   Procedure: LAPAROSCOPIC CHOLECYSTECTOMY;  Surgeon: [name], MD;  Location: [facility];  Service: General;  Laterality: N/A;   ESOPHAGOGASTRODUODENOSCOPY (EGD) WITH PROPOFOL N/A 02/03/2018   Procedure: ESOPHAGOGASTRODUODENOSCOPY (EGD) WITH PROPOFOL;  Surgeon: Clarene Essex, MD;  Location: [facility];  Service: Endoscopy;  Laterality: N/A;   FRACTURE SURGERY     INSERT / REPLACE / REMOVE PACEMAKER  06/14/2018   LAPAROSCOPIC APPENDECTOMY N/A 01/21/2018   Procedure: APPENDECTOMY LAPAROSCOPIC;  Surgeon: Kinsinger, Arta Bruce, MD;  Location: [facility];  Service: General;  Laterality: N/A;   PACEMAKER IMPLANT N/A 06/14/2018   Procedure: PACEMAKER IMPLANT - Dual Chamber;  Surgeon: Sanda Klein, MD;  Location: [facility];  Service: Cardiovascular;  Laterality: N/A;   PROSTATE BIOPSY  02/08/14   gleason 4+5=9, 12/12 cores positive, 54 gm   RADIOACTIVE SEED IMPLANT N/A 06/29/2014   Procedure: RADIOACTIVE SEED IMPLANT;  Surgeon: Bernestine Amass, MD;  Location: [facility];  Service: Urology;  Laterality: N/A;   WRIST FRACTURE SURGERY Right 1980s   Family History  Problem Relation Age of Onset   Alzheimer's disease Mother    Alzheimer's disease Father    Cancer Father        prostate   Arthritis Sister    Cancer Brother         prostate   Social History   Socioeconomic History   Marital status: Married    Spouse name: Not on file   Number of children: 3   Years of education: Not on file   Highest education level: Not on file  Occupational History   Occupation: Freight forwarder    Employer: [employer]    Comment: retired   Occupation: Freight forwarder    Comment: [employer]-[place]ired  Tobacco Use   Smoking status: Former    Types: Cigars    Quit date: 09/14/2012    Years since quitting: 9.8   Smokeless tobacco: Never   Tobacco comments:    little cigars; the small ones"  Vaping Use   Vaping Use: Never used  Substance and Sexual Activity   Alcohol use: Not Currently    Alcohol/week: 14.0 standard drinks of alcohol  Types: 14 Cans of beer Tony week   Drug use: No   Sexual activity: Not Currently  Other Topics Concern   Not on file  Social History Narrative   Lives with wife and 2 dogs in one level home; daughter and her family co-habitate.   Has three daughters, all in [place], supportive. Five grandchildren.   Wants to return to doing silver sneakers once gym re-opens.         Social Determinants of Health   Financial Resource Strain: Low Risk  (07/29/2022)   Overall Financial Resource Strain (CARDIA)    Difficulty of Paying Living Expenses: Not hard at all  Recent Concern: Financial Resource Strain - Medium Risk (05/27/2022)   Overall Financial Resource Strain (CARDIA)    Difficulty of Paying Living Expenses: Somewhat hard  Food Insecurity: No Food Insecurity (07/29/2022)   Hunger Vital Sign    Worried About Running Out of Food in the Last Year: Never true    Ran Out of Food in the Last Year: Never true  Transportation Needs: No Transportation Needs (07/29/2022)   PRAPARE - Hydrologist (Medical): No    Lack of Transportation (Non-Medical): No  Physical Activity: Insufficiently Active (07/29/2022)   Exercise Vital Sign    Days of Exercise Tony Week: 5 days    Minutes of Exercise  Tony Session: 20 min  Stress: No Stress Concern Present (07/29/2022)   [place]    Feeling of Stress : Not at all  Social Connections: [place] (07/29/2022)   Social Connection and Isolation Panel [NHANES]    Frequency of Communication with Friends and Family: More than three times a week    Frequency of Social Gatherings with Friends and Family: More than three times a week    Attends Religious Services: More than 4 times Tony year    Active Member of [employer] or Organizations: Yes    Attends Music therapist: More than 4 times Tony year    Marital Status: Married    Tobacco Counseling Counseling given: Not Answered Tobacco comments: little cigars; the small ones"   Clinical Intake:  Pre-visit preparation completed: No  Pain : No/denies pain     BMI - recorded: 3.47 Nutritional Risks: None Diabetes: No  How often do you need to have someone help you when you read instructions, pamphlets, or other written materials from your doctor or pharmacy?: 3 - Sometimes (Wife assist)  Diabetic? No  Interpreter Needed?: No  Information entered by :: Rolene Arbour LPN   Activities of Daily Living    07/29/2022   10:34 AM 05/05/2022    8:30 AM  In your present state of health, do you have any difficulty performing the following activities:  Hearing? 0   Vision? 0   Difficulty concentrating or making decisions? 0   Walking or climbing stairs? 0   Dressing or bathing? 0   Doing errands, shopping? 0 0  Preparing Food and eating ? N   Using the Toilet? N   In the past six months, have you accidently leaked urine? N   Do you have problems with loss of bowel control? N   Managing your Medications? N   Managing your Finances? N   Housekeeping or managing your Housekeeping? N     Patient Care Team: [name], [name], MD as PCP - General (Family Medicine) Sanda Klein, MD as PCP - Cardiology  (Cardiology) Zola Button  Delane Ginger, MD as Consulting Physician (Oncology) Viona Gilmore, [facility] as Pharmacist (Pharmacist)  Indicate any recent Medical Services you may have received from other than [facility] providers in the past year (date may be approximate).     Assessment:   This is a routine wellness examination for Tony [name].  Hearing/Vision screen Hearing Screening - Comments:: Denies hearing difficulties   Vision Screening - Comments:: Wears reading glasses - up to date with routine eye exams with [place] issues and exercise activities discussed: Current Exercise Habits: Home exercise routine, Type of exercise: walking, Time (Minutes): 20, Frequency (Times/Week): 5, Weekly Exercise (Minutes/Week): 100, Intensity: Mild, Exercise limited by: None identified   Goals Addressed               This Visit's Progress     No current goals (pt-stated)         Depression Screen    07/29/2022   10:32 AM 07/13/2022    8:54 AM 06/17/2022    9:20 AM 05/12/2022    9:30 AM 01/14/2022    2:09 PM 10/27/2021    9:37 AM 07/22/2021    2:43 PM  PHQ 2/9 Scores  PHQ - 2 Score 0 0 0 0 0 0 0  PHQ- 9 Score 0 0 0 5  2     Fall Risk    07/29/2022   10:34 AM 07/13/2022    8:54 AM 06/17/2022    9:20 AM 05/12/2022    9:30 AM 01/14/2022    2:09 PM  Fall Risk   Falls in the past year? 0 0 0 0 0  Number falls in past yr: 0 0 0 0 0  Injury with Fall? 0 0 0 0 0  Risk for fall due to : No Fall Risks No Fall Risks History of fall(s) History of fall(s) History of fall(s)  Follow up Falls prevention discussed Falls evaluation completed Falls evaluation completed Falls evaluation completed Falls evaluation completed    [place]:  Any stairs in or around the home? Yes  If so, are there any without handrails? No  Home free of loose throw rugs in walkways, pet beds, electrical cords, etc? Yes  Adequate lighting in your home to reduce risk of falls? Yes    ASSISTIVE DEVICES UTILIZED TO PREVENT FALLS:  Life alert? No  Use of a cane, walker or w/c? Yes  Grab bars in the bathroom? Yes  Shower chair or bench in shower? Yes  Elevated toilet seat or a handicapped toilet? Yes   TIMED UP AND GO:  Was the test performed? No . Audio Visit   Cognitive Function:    12/02/2018    9:37 AM 11/30/2017    9:52 AM  MMSE - Mini Mental State Exam  Orientation to time 5 5  Orientation to Place 5 5  Registration 3 3  Attention/ Calculation 4 1  Recall 2 1  Language- name 2 objects 1 2  Language- name 2 objects-comments could not correctly identify picture of bird (flamingo)   Language- repeat 1 1  Language- follow 3 step command 3 3  Language- read & follow direction 1 1  Write a sentence 1 1  Copy design 0 1  Copy design-comments objects had 4 sides, not 5   Total score 26 24        07/29/2022   10:36 AM 07/22/2021    2:53 PM 06/19/2020    9:35 AM  6CIT Screen  What Year? 0 points 0 points 0 points  What month? 0 points 0 points 0 points  What time? 0 points 0 points   Count back from 20 0 points 0 points 0 points  Months in reverse 0 points 0 points 0 points  Repeat phrase 2 points 2 points 2 points  Total Score 2 points 2 points     Immunizations Immunization History  Administered Date(s) Administered   Fluad Quad(high Dose 65+) 05/17/2019, 05/21/2020, 06/20/2021, 06/17/2022   Influenza, High Dose Seasonal PF 05/05/2016, 06/01/2017, 06/22/2018   PFIZER(Purple Top)SARS-COV-2 Vaccination 10/05/2019, 10/26/2019, 06/18/2020   Pneumococcal Conjugate-13 12/27/2013   Pneumococcal Polysaccharide-23 07/31/2020   Td 02/13/2019   Tdap 01/03/2015    TDAP status: Up to date  Flu Vaccine status: Up to date  Pneumococcal vaccine status: Up to date  Covid-19 vaccine status: Completed vaccines  Qualifies for Shingles Vaccine? Yes   Zostavax completed No   Shingrix Completed?: No.    Education has been provided regarding the  importance of this vaccine. Patient has been advised to call insurance company to determine out of pocket expense if they have not yet received this vaccine. Advised may also receive vaccine at local pharmacy or Health Dept. Verbalized acceptance and understanding.  Screening Tests Health Maintenance  Topic Date Due   COVID-19 Vaccine (4 - Pfizer risk series) 09/17/2022 (Originally 08/13/2020)   Zoster Vaccines- Shingrix (1 of 2) 09/18/2022 (Originally 07/25/1960)   Medicare Annual Wellness (AWV)  07/30/2023   TETANUS/TDAP  02/12/2029   Pneumonia Vaccine 32+ Years old  Completed   INFLUENZA VACCINE  Completed   HPV VACCINES  Aged Out    Health Maintenance  There are no preventive care reminders to display for this patient.   Colorectal cancer screening: No longer required.   Lung Cancer Screening: (Low Dose CT Chest recommended if Age 92-80 years, 30 pack-year currently smoking OR have quit w/in 15years.) does not qualify.     Additional Screening:  Hepatitis C Screening: does not qualify; Completed   Vision Screening: Recommended annual ophthalmology exams for early detection of glaucoma and other disorders of the eye. Is the patient up to date with their annual eye exam?  Yes  Who is the provider or what is the name of the office in which the patient attends annual eye exams? [place] If pt is not established with a provider, would they like to be referred to a provider to establish care? No .   Dental Screening: Recommended annual dental exams for proper oral hygiene  Community Resource Referral / Chronic Care Management:  CRR required this visit?  No   CCM required this visit?  No      Plan:     I have personally reviewed and noted the following in the patient's chart:   Medical and social history Use of alcohol, tobacco or illicit drugs  Current medications and supplements including opioid prescriptions. Patient is not currently taking opioid  prescriptions. Functional ability and status Nutritional status Physical activity Advanced directives List of other physicians Hospitalizations, surgeries, and ER visits in previous 12 months Vitals Screenings to include cognitive, depression, and falls Referrals and appointments  In addition, I have reviewed and discussed with patient certain preventive protocols, quality metrics, and best practice recommendations. A written personalized care plan for preventive services as well as general preventive health recommendations were provided to patient.     Criselda Peaches, LPN   [id number]   Nurse Notes:  None

## 2022-08-04 ENCOUNTER — Encounter: Payer: Self-pay | Admitting: *Deleted

## 2022-08-04 ENCOUNTER — Telehealth: Payer: Self-pay | Admitting: *Deleted

## 2022-08-04 NOTE — Patient Outreach (Signed)
  Care Coordination   Initial Visit Note   08/04/2022 Name: Tony [name] MRN: [id number] DOB: 17-Apr-1941  Tony [name] is a 81 y.o. year old male who sees Tony [name], Tony So, MD for primary care. I  spoke with the spouse DPR Tony [name] today.  What matters to the patients health and wellness today?  No needs    Goals Addressed               This Visit's Progress     COMPLETED: No Needs (pt-stated)        Care Coordination Interventions: Reviewed medications with patient and discussed Adherence with all medications with no needed refills Reviewed scheduled/upcoming provider appointments including sufficient transportation Assessed social determinant of health barriers          SDOH assessments and interventions completed:  Yes  SDOH Interventions Today    Flowsheet Row Most Recent Value  SDOH Interventions   Food Insecurity Interventions Intervention Not Indicated  Housing Interventions Intervention Not Indicated  Transportation Interventions Intervention Not Indicated  Utilities Interventions Intervention Not Indicated        Care Coordination Interventions Activated:  Yes  Care Coordination Interventions:  Yes, provided   Follow up plan: No further intervention required.   Encounter Outcome:  Pt. Visit Completed   Raina Mina, RN Care Management Coordinator [place] Office [phone]

## 2022-08-04 NOTE — Patient Instructions (Signed)
Visit Information  Thank you for taking time to visit with me today. Please don't hesitate to contact me if I can be of assistance to you.   Following are the goals we discussed today:   Goals Addressed               This Visit's Progress     COMPLETED: No Needs (pt-stated)        Care Coordination Interventions: Reviewed medications with patient and discussed Adherence with all medications with no needed refills Reviewed scheduled/upcoming provider appointments including sufficient transportation Assessed social determinant of health barriers          Please call the care guide team at [phone] if you need to cancel or reschedule your appointment.   If you are experiencing a Mental Health or [place] or need someone to talk to, please call the Suicide and Crisis Lifeline: 988  Patient verbalizes understanding of instructions and care plan provided today and agrees to view in [place]. Active MyChart status and patient understanding of how to access instructions and care plan via MyChart confirmed with patient.     No further follow up required: No needs presented today.  Raina Mina, RN Care Management Coordinator [place] Office [phone]

## 2022-08-13 DIAGNOSIS — J9611 Chronic respiratory failure with hypoxia: Secondary | ICD-10-CM | POA: Diagnosis not present

## 2022-08-14 ENCOUNTER — Emergency Department (HOSPITAL_BASED_OUTPATIENT_CLINIC_OR_DEPARTMENT_OTHER): Payer: Medicare Other

## 2022-08-14 ENCOUNTER — Encounter (HOSPITAL_BASED_OUTPATIENT_CLINIC_OR_DEPARTMENT_OTHER): Payer: Self-pay | Admitting: *Deleted

## 2022-08-14 ENCOUNTER — Other Ambulatory Visit: Payer: Self-pay

## 2022-08-14 ENCOUNTER — Emergency Department (HOSPITAL_BASED_OUTPATIENT_CLINIC_OR_DEPARTMENT_OTHER)
Admission: EM | Admit: 2022-08-14 | Discharge: 2022-08-14 | Disposition: A | Discharge status: Home / Self Care | Payer: Medicare Other | Attending: Emergency Medicine | Admitting: Emergency Medicine

## 2022-08-14 DIAGNOSIS — Z950 Presence of cardiac pacemaker: Secondary | ICD-10-CM | POA: Diagnosis not present

## 2022-08-14 DIAGNOSIS — K5730 Diverticulosis of large intestine without perforation or abscess without bleeding: Secondary | ICD-10-CM | POA: Diagnosis not present

## 2022-08-14 DIAGNOSIS — R109 Unspecified abdominal pain: Secondary | ICD-10-CM | POA: Diagnosis present

## 2022-08-14 DIAGNOSIS — I10 Essential (primary) hypertension: Secondary | ICD-10-CM | POA: Diagnosis not present

## 2022-08-14 DIAGNOSIS — Z8546 Personal history of malignant neoplasm of prostate: Secondary | ICD-10-CM | POA: Diagnosis not present

## 2022-08-14 DIAGNOSIS — I2510 Atherosclerotic heart disease of native coronary artery without angina pectoris: Secondary | ICD-10-CM | POA: Insufficient documentation

## 2022-08-14 DIAGNOSIS — J449 Chronic obstructive pulmonary disease, unspecified: Secondary | ICD-10-CM | POA: Insufficient documentation

## 2022-08-14 DIAGNOSIS — K429 Umbilical hernia without obstruction or gangrene: Secondary | ICD-10-CM | POA: Insufficient documentation

## 2022-08-14 LAB — CBC WITH DIFFERENTIAL/PLATELET
Abs Immature Granulocytes: 0.01 10*3/uL (ref 0.00–0.07)
Basophils Absolute: 0 10*3/uL (ref 0.0–0.1)
Basophils Relative: 1 %
Eosinophils Absolute: 0.2 10*3/uL (ref 0.0–0.5)
Eosinophils Relative: 3 %
HCT: 40.1 % (ref 39.0–52.0)
Hemoglobin: 13.3 g/dL (ref 13.0–17.0)
Immature Granulocytes: 0 %
Lymphocytes Relative: 23 %
Lymphs Abs: 1.3 10*3/uL (ref 0.7–4.0)
MCH: 29.9 pg (ref 26.0–34.0)
MCHC: 33.2 g/dL (ref 30.0–36.0)
MCV: 90.1 fL (ref 80.0–100.0)
Monocytes Absolute: 0.6 10*3/uL (ref 0.1–1.0)
Monocytes Relative: 11 %
Neutro Abs: 3.3 10*3/uL (ref 1.7–7.7)
Neutrophils Relative %: 62 %
Platelets: 217 10*3/uL (ref 150–400)
RBC: 4.45 MIL/uL (ref 4.22–5.81)
RDW: 14.3 % (ref 11.5–15.5)
WBC: 5.4 10*3/uL (ref 4.0–10.5)
nRBC: 0 % (ref 0.0–0.2)

## 2022-08-14 LAB — BASIC METABOLIC PANEL
Anion gap: 9 (ref 5–15)
BUN: 18 mg/dL (ref 8–23)
CO2: 25 mmol/L (ref 22–32)
Calcium: 9.2 mg/dL (ref 8.9–10.3)
Chloride: 101 mmol/L (ref 98–111)
Creatinine, Ser: 0.93 mg/dL (ref 0.61–1.24)
GFR, Estimated: >60 mL/min (ref >60)
Glucose, Bld: 112 mg/dL — ABNORMAL HIGH (ref 70–99)
Potassium: 3.9 mmol/L (ref 3.5–5.1)
Sodium: 135 mmol/L (ref 135–145)

## 2022-08-14 MED ORDER — IOHEXOL 300 MG/ML  SOLN
100.0000 mL | Freq: Once | INTRAMUSCULAR | Status: AC | PRN
  Administered 2022-08-14: 100 mL via INTRAVENOUS

## 2022-08-14 NOTE — ED Provider Notes (Signed)
Emergency Department Provider Note   I have reviewed the triage vital signs and the nursing notes.   HISTORY  Chief Complaint Abdominal Pain   HPI Prakash Kimberling Vincent is a 81 y.o. male with past history reviewed below including hypertension and surgical history including prior appendectomy and recent cholecystectomy in early September presents to the emergency department with bulging mass near the bellybutton.  Symptoms are worse with straining or sitting up.  He is not having pain in the area.  No vomiting.  He has some constipation but continues to pass flatus.  No overlying skin changes.  No shortness of breath or chest pain. He is an established patient with [facility].    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 ENT: No sore throat. Cardiovascular: Denies chest pain. Respiratory: Denies shortness of breath. Gastrointestinal: No abdominal pain. Bulging abdominal mass. No nausea, no vomiting.  No diarrhea.  No constipation. Genitourinary: Negative for dysuria. Musculoskeletal: Negative for back pain. Skin: Negative for rash. Neurological: Negative for headaches.   ____________________________________________   PHYSICAL EXAM:  VITAL SIGNS: ED Triage Vitals  Enc Vitals Group     BP 08/14/22 1825 (!) 149/99     Pulse Rate 08/14/22 1825 60     Resp 08/14/22 1825 17     Temp --      Temp src --      SpO2 08/14/22 1825 97 %      Weight 08/14/22 1827 207 lb (93.9 kg)     Height 08/14/22 1827 '5\' 8"'$  (1.727 m)   Constitutional: Alert and oriented. Well appearing and in no acute distress. Eyes: Conjunctivae are normal.  Head: Atraumatic. Nose: No congestion/rhinnorhea. Mouth/Throat: Mucous membranes are moist.   Neck: No stridor.   Cardiovascular: Normal rate, regular rhythm. Good peripheral circulation. Grossly normal heart sounds.   Respiratory: Normal respiratory effort.  No retractions. Lungs CTAB. Gastrointestinal: Soft and nontender.  There is a soft umbilical hernia palpable without overlying skin changes or pain. No distention.  Musculoskeletal: No gross deformities of extremities. Neurologic:  Normal speech and language.  Skin:  Skin is warm, dry and intact. No rash noted.  ____________________________________________   LABS (all labs ordered are listed, but only abnormal results are displayed)  Labs Reviewed  BASIC METABOLIC PANEL - Abnormal; Notable for the following components:      Result Value   Glucose, Bld 112 (*)    All other components within normal limits  CBC WITH DIFFERENTIAL/PLATELET   ____________________________________________  RADIOLOGY  CT ABDOMEN PELVIS W CONTRAST  Result Date: 08/14/2022 CLINICAL DATA:  Abdominal pain.  Umbilical hernia. EXAM: CT ABDOMEN AND PELVIS WITH CONTRAST TECHNIQUE: Multidetector CT imaging of the abdomen and pelvis was performed using the standard protocol following bolus administration of intravenous contrast. RADIATION DOSE REDUCTION: This exam was performed according to the departmental dose-optimization program which includes  automated exposure control, adjustment of the mA and/or kV according to patient size and/or use of iterative reconstruction technique. CONTRAST:  156m OMNIPAQUE IOHEXOL 300 MG/ML  SOLN COMPARISON:  CT dated 01/28/2022. FINDINGS: Lower chest: The visualized lung bases are clear. There is coronary vascular calcification. Cardiac  pacemaker wire noted. No intra-abdominal free air or free fluid. Hepatobiliary: Apparent mild fatty liver. No biliary dilatation. Cholecystectomy. No retained calcified stone noted in the central CBD. Pancreas: Fatty infiltration of the pancreas. No active inflammatory changes. Spleen: Normal in size without focal abnormality. Adrenals/Urinary Tract: The adrenal glands unremarkable. Focal area of cortical scarring and dystrophic calcification of the interpolar right kidney similar to prior CT. Similar appearance of a 5 cm left renal inferior pole cyst. There is no hydronephrosis on either side. There is symmetric enhancement and excretion of contrast by both kidneys. The visualized ureters and the urinary bladder appear unremarkable. Stomach/Bowel: There is sigmoid diverticulosis without active inflammatory changes. There is a small umbilical hernia containing a loop of small bowel. No evidence of obstruction. The neck of the hernia defect measures approximately 2.5 cm in transverse diameter. Vascular/Lymphatic: Advanced aortoiliac atherosclerotic disease. The aorta is tortuous. 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 The IVC is unremarkable. No portal venous gas. There is no adenopathy. Reproductive: Prostate brachytherapy seeds. Other: Small bilateral fat containing inguinal hernias. Musculoskeletal: Scoliosis and degenerative changes of the spine. No acute osseous pathology. IMPRESSION: 1. No acute intra-abdominal or pelvic pathology. 2. Small umbilical hernia containing a loop of small bowel. No evidence of obstruction. 3. Sigmoid diverticulosis. 4. 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 Recommend follow-up every 12 months and vascular consultation. This recommendation follows ACR consensus guidelines: White Paper of the ACR Incidental Findings Committee II on Vascular Findings. J Am Coll  Radiol 2013;; 00:[id number] 5.  Aortic Atherosclerosis (ICD10-I70.0). Electronically Signed   By: AAnner CreteM.D.   On: 08/14/2022 20:37    ____________________________________________   PROCEDURES  Procedure(s) performed:   Procedures  None  ____________________________________________   INITIAL IMPRESSION / ASSESSMENT AND PLAN / ED COURSE  Pertinent labs & imaging results that were available during my care of the patient were reviewed by me and considered in my medical decision making (see chart for details).   This patient is Presenting for Evaluation of abdominal distension, which does require a range of treatment options, and is a complaint that involves a moderate risk of morbidity and mortality.  The Differential Diagnoses include umbilical hernia, incisional hernia, incarcerated hernia, etc.  Critical Interventions-    Medications  iohexol (OMNIPAQUE) 300 MG/ML solution 100 mL (100 mLs Intravenous Contrast Given 08/14/22 2016)     I did obtain Additional Historical Information from family at bedside.   I decided to review pertinent External Data, and in summary patient with lap chole with Dr. AZenia Resideson 05/15/22.   Clinical Laboratory Tests Ordered, included CBC without anemia or leukocytosis. No AKI.   Radiologic Tests Ordered, included CT abdomen/pelvis. I independently interpreted the images and agree with radiology interpretation.   Cardiac Monitor Tracing which shows NSR.    Social Determinants of Health Risk patient is not an active smoker.   Medical Decision Making: Summary:  Patient presents to the emergency department with an umbilical hernia on exam.  Clinically not incarcerated.  Patient does describe some difficulty with bowel movements although is passing flatus.  Given this, plan for CT  imaging to rule out obstruction.  If negative, can follow-up as outpatient with general surgery.  Reevaluation with update and discussion with patient and family at  bedside.  We discussed his imaging findings and plan for outpatient surgical follow-up.  He will call for an appointment. We did discuss ED return precautions.   Disposition: discharge  ____________________________________________  FINAL CLINICAL IMPRESSION(S) / ED DIAGNOSES  Final diagnoses:  Umbilical hernia without obstruction and without gangrene   Note:  This document was prepared using Dragon voice recognition software and may include unintentional dictation errors.  Nanda Quinton, MD, [facility]    Harnoor Reta, [facility], MD 08/15/22 [phone]

## 2022-08-14 NOTE — ED Notes (Signed)
Pt. Aware he will have to be seen by Dr. Zenia Resides again and is going to have to have a CT here tonight.

## 2022-08-14 NOTE — ED Triage Notes (Signed)
Pt. Reports his belly is has bees swollen the past 2 days.  Pt. Has a knot at the umbilicus to the L side.  Pt. Reports his last time he pooped was at 2pm,

## 2022-08-14 NOTE — Discharge Instructions (Signed)
You were seen in the emergency room today with a hernia.  I have attached some information on this form about this condition.  Please reach out to your general surgeon to schedule a follow-up appointment.  They can talk to you about management options.  If you develop severe abdominal pain or begin vomiting or unable to have a bowel movement and stop passing gas you should return to the emergency department immediately for reevaluation.

## 2022-08-14 NOTE — ED Notes (Signed)
Pt. Reports he has had some constipation and has felt a hernia pop out as his belly button..  Noted knot at the left side of the umbilicus

## 2022-08-18 ENCOUNTER — Other Ambulatory Visit: Payer: Self-pay | Admitting: Family Medicine

## 2022-08-18 DIAGNOSIS — R053 Chronic cough: Secondary | ICD-10-CM

## 2022-08-18 DIAGNOSIS — I10 Essential (primary) hypertension: Secondary | ICD-10-CM

## 2022-08-19 NOTE — Progress Notes (Signed)
HPI M former smoker followed for OSA, COPD, hx Respiratory Failure, dCHF w pulmonary edema, hx E.Coli Bacteremia, CAD, Chronic AFib/ Pacemaker, Prostate Cancer, Aortic Aneurysm HST 09/02/20- AHI 40.8/ hr, desaturation to 66%/ avbg 88%, body weight 228 lbs Pending CPAP titration w O2 check on 2/22 PFT 09/09/20- Minimal obstruction CPAP titration 11/05/20- to BIPAP 25/21 and required O2 3l Walk Test on Room Air- O2 qualifying 02/16/22- min sat 87% after 2 laps, Max HR 92 (paced).  ===================================================================== .  02/16/22- 80 yoM former smoker followed for OSA, COPD, hx Respiratory Failure, dCHF w pulmonary edema, hx E.Coli Bacteremia, Chronic AFib/ Pacemaker, Prostate Cancer, Aortic Aneurysms. Celiac Stenosis, Obesity, Covid pneumonia April2023, Cholelithiasis,  -Symbicort 160, Proair hfa   Prednisone 5 mg daily(Heme/Onc) AutoBIPAP EPAP 4-8, IPAP 14-20, PS 2, with O2 3L for sleep/ [employer] Download-compliance   86.7%, AHI 5/ hr Aortic aneurysms followed by Dr Laneta Simmers Body weight today-223 lbs Covid vax-4 PHIZER                            Wife here Flu vax had -----Follow up. Patient says he can't walk too far without breathing so hard.  Recent Hgb 12.6 Download reviewed.  He does well with BiPAP.  Has been taking machine off if he is coughing much or in thunderstorms.  We discussed this.  [employer] sent replacement filters and wife does not think they fit.  I suggested she call [employer] or make an appointment and take the machine over so that [employer] staff can look at it, get correct filters if necessary, and teach her. He is taking  benzonatate for persistent cough with clear sputum, no blood or fever.  Does not think he has an infection.  Thinks shortness of breath with exertion may be worse since Christmas.  Had COVID infection in April.  He continues to use Symbicort and albuterol inhalers.  Walk test today shows he does qualify for portable O2 at 2 L. -Wife says he  needs preoperative clearance for cholecystectomy.  I have not seen a form yet.  He is obviously frail with multiple morbidities but I think he is stable currently without acute correctable respiratory problem and can go ahead with necessary surgery.  He is above average risk and pulmonary can be consulted in hospital if necessary.  He has already been evaluated by cardiology. Walk Test on Room Air- O2 qualifying 02/16/22- min sat 87% after 2 laps, Max HR 92 (paced).  CTachest aorta 01/28/22 IMPRESSION: 1. 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 Recommend semi-annual imaging followup by CTA or MRA and referral to cardiothoracic surgery if not already obtained. This recommendation follows 2010 ACCF/AHA/AATS/ACR/ASA/SCA/SCAI/SIR/STS/SVM Guidelines for the Diagnosis and Management of Patients With Thoracic Aortic Disease. Circulation. 2010; 121: W098-J191. Aortic aneurysm NOS (ICD10-I71.9) 2. 4.2 cm infrarenal abdominal aortic aneurysm, stable. Recommend follow-up every 12 months and vascular consultation. This recommendation follows ACR consensus guidelines: White Paper of the ACR Incidental Findings Committee II on Vascular Findings. J Am Coll Radiol 2013; 10:789-794. 3. Coronary and Aortic Atherosclerosis (ICD10-170.0). 4.  Emphysema (ICD10-J43.9). 5. Scattered colonic diverticula 6. Possible cholelithiasis  08/20/22-  81 yoM former smoker followed for OSA, COPD, hx Respiratory Failure, CAD, dCHF w pulmonary edema, hx E.Coli Bacteremia, Chronic AFib/ Pacemaker, Prostate Cancer,  Celiac Stenosis, Obesity, Covid pneumonia April2023, Cholelithiasis, Aortic aneurysms followed by Dr Laneta Simmers -Symbicort 160, Proair hfa    [place] BiPAP 25/21 PS 2, with O2 3L for sleep/ [employer]  Download-compliance   blank SD card Body weight today 199 lbs Covid vax-4 PHIZER                            Wife  and daughter here Flu vax had He is pending evaluation for possible repair of a ventral abdominal  hernia. Both of his inhalers are read and he is very unclear which is which, but he says he has several of one of the kinds.  It sounds as if he may be using Symbicort each morning and rescue inhaler each evening.  I went over this with him and his family and I wrote it out on his AVS.  I do not think we want to make his medications more complicated or more expensive if we can avoid it. It sounds as if he is using his BiPAP machine but he will have to take it into the homecare company to get a functioning SD card.  ROS-see HPI   + = positive Constitutional:    weight loss, night sweats, fevers, chills, fatigue, lassitude. HEENT:    headaches, difficulty swallowing, tooth/dental problems, sore throat,       sneezing, itching, ear ache, nasal congestion, post nasal drip, snoring CV:    chest pain, orthopnea, PND, swelling in lower extremities, anasarca,                                   dizziness, +palpitations Resp:   +shortness of breath with exertion or at rest.                productive cough,   +non-productive cough, coughing up of blood.              change in color of mucus.  wheezing.   Skin:    rash or lesions. GI:  + heartburn, indigestion, abdominal pain, nausea, vomiting, diarrhea,                 change in bowel habits, loss of appetite GU: dysuria, change in color of urine, no urgency or frequency.   flank pain. MS:   joint pain, stiffness, decreased range of motion, back pain. Neuro-     nothing unusual Psych:  change in mood or affect.  depression or +anxiety.   memory loss.  OBJ- Physical Exam General- Alert, Oriented, Affect-appropriate, Distress- none acute, + obese Skin- rash-none, lesions- none, excoriation- none Lymphadenopathy- none Head- atraumatic            Eyes- Gross vision intact, PERRLA, conjunctivae and secretions clear            Ears- Hearing, canals-normal            Nose- Clear, no-Septal dev, mucus, polyps, erosion, perforation             Throat-  Mallampati IV , mucosa clear , drainage- none, tonsils- atrophic, + teeth Neck- flexible , trachea midline, no stridor , thyroid nl, carotid no bruit Chest - symmetrical excursion , unlabored           Heart/CV- RRR , no murmur , no gallop  , no rub, nl s1 s2                           - JVD- none , edema+trace, stasis changes- none, varices- none  Lung- + coarse/ unlabored, wheeze+slight, cough- none , dullness-none, rub- none           Chest wall- +pacemaker L  Abd-  Br/ Gen/ Rectal- Not done, not indicated Extrem- cyanosis- none, clubbing, none, atrophy- none, strength- nl, + cane Neuro- grossly intact to observation

## 2022-08-19 NOTE — Telephone Encounter (Signed)
According to medication list he is not longer on Amlodipine. He is on Metoprolol. Can you please verify with pt.  Thanks, BJ

## 2022-08-20 ENCOUNTER — Encounter: Payer: Self-pay | Admitting: Internal Medicine

## 2022-08-20 ENCOUNTER — Ambulatory Visit: Payer: Medicare Other | Admitting: Internal Medicine

## 2022-08-20 VITALS — BP 114/76 | HR 89 | Ht 68.0 in | Wt 199.8 lb

## 2022-08-20 DIAGNOSIS — J9611 Chronic respiratory failure with hypoxia: Secondary | ICD-10-CM | POA: Diagnosis not present

## 2022-08-20 DIAGNOSIS — G4733 Obstructive sleep apnea (adult) (pediatric): Secondary | ICD-10-CM | POA: Diagnosis not present

## 2022-08-20 DIAGNOSIS — K432 Incisional hernia without obstruction or gangrene: Secondary | ICD-10-CM | POA: Diagnosis not present

## 2022-08-20 DIAGNOSIS — J449 Chronic obstructive pulmonary disease, unspecified: Secondary | ICD-10-CM | POA: Diagnosis not present

## 2022-08-20 NOTE — Patient Instructions (Signed)
You should contact [facility] and make an appointment to bring them your BIPAP machine so they can put a new SD card in it.  Use your Symbicort maintenance inhaler  1 puffs then rinse your mouth, every morning and every evening.  Use your albuterol/ ProAir inhaler up to every 6 hours, IF NEEDED.  Please call if we can help

## 2022-08-20 NOTE — Assessment & Plan Note (Signed)
I have written out instructions for appropriate use of his Symbicort maintenance inhaler and his albuterol rescue inhaler.  Hopefully, used as directed, these will be sufficient.

## 2022-08-20 NOTE — Assessment & Plan Note (Signed)
I think he is benefiting from BiPAP but will need to take his machine into the DME company so he can get a working card for downloads. Plan-continue BiPAP 25/21, PS 2 with O2 3 L

## 2022-08-20 NOTE — Assessment & Plan Note (Signed)
He remains dependent on sleep oxygen at 3 L

## 2022-09-09 ENCOUNTER — Telehealth: Payer: Self-pay | Admitting: Family Medicine

## 2022-09-09 NOTE — Telephone Encounter (Signed)
C/o cough congestion sore throat headache x 2d. Declined OV, wants a prescription. OTC gives little relief

## 2022-09-09 NOTE — Telephone Encounter (Signed)
Called home phone number and spoke with wife. I let wife know that they will need a OV for medication to be sent in. Wife declined OV here and at another office, also declined to schedule for next week as well.        FYI

## 2022-09-09 NOTE — Telephone Encounter (Signed)
Per pcp - needs appointment to be seen.

## 2022-09-11 DIAGNOSIS — R051 Acute cough: Secondary | ICD-10-CM | POA: Diagnosis not present

## 2022-09-11 DIAGNOSIS — Z20822 Contact with and (suspected) exposure to covid-19: Secondary | ICD-10-CM | POA: Diagnosis not present

## 2022-09-12 DIAGNOSIS — J9611 Chronic respiratory failure with hypoxia: Secondary | ICD-10-CM | POA: Diagnosis not present

## 2022-09-15 ENCOUNTER — Ambulatory Visit (INDEPENDENT_AMBULATORY_CARE_PROVIDER_SITE_OTHER): Payer: Medicare Other

## 2022-09-15 DIAGNOSIS — I495 Sick sinus syndrome: Secondary | ICD-10-CM

## 2022-09-15 LAB — CUP PACEART REMOTE DEVICE CHECK
Battery Remaining Longevity: 113 mo
Battery Voltage: 3.01 V
Brady Statistic AP VP Percent: 0.22 %
Brady Statistic AP VS Percent: 74.63 %
Brady Statistic AS VP Percent: 0.13 %
Brady Statistic AS VS Percent: 25.03 %
Brady Statistic RA Percent Paced: 74.16 %
Brady Statistic RV Percent Paced: 0.35 %
Date Time Interrogation Session: 20240101222510
Implantable Lead Connection Status: 753985
Implantable Lead Connection Status: 753985
Implantable Lead Implant Date: 20191001
Implantable Lead Implant Date: 20191001
Implantable Lead Location: 753859
Implantable Lead Location: 753860
Implantable Lead Model: 5076
Implantable Lead Model: 5076
Implantable Pulse Generator Implant Date: 20191001
Lead Channel Impedance Value: 304 Ohm
Lead Channel Impedance Value: 304 Ohm
Lead Channel Impedance Value: 361 Ohm
Lead Channel Impedance Value: 380 Ohm
Lead Channel Pacing Threshold Amplitude: 0.375 V
Lead Channel Pacing Threshold Amplitude: 0.5 V
Lead Channel Pacing Threshold Pulse Width: 0.4 ms
Lead Channel Pacing Threshold Pulse Width: 0.4 ms
Lead Channel Sensing Intrinsic Amplitude: 2 mV
Lead Channel Sensing Intrinsic Amplitude: 2 mV
Lead Channel Sensing Intrinsic Amplitude: 3.5 mV
Lead Channel Sensing Intrinsic Amplitude: 3.5 mV
Lead Channel Setting Pacing Amplitude: 1.5 V
Lead Channel Setting Pacing Amplitude: 2.5 V
Lead Channel Setting Pacing Pulse Width: 0.4 ms
Lead Channel Setting Sensing Sensitivity: 0.9 mV
Zone Setting Status: 755011
Zone Setting Status: 755011

## 2022-09-16 ENCOUNTER — Telehealth: Payer: Self-pay

## 2022-09-16 ENCOUNTER — Ambulatory Visit: Payer: Self-pay | Admitting: Surgery

## 2022-09-16 DIAGNOSIS — K432 Incisional hernia without obstruction or gangrene: Secondary | ICD-10-CM | POA: Diagnosis not present

## 2022-09-16 NOTE — Telephone Encounter (Signed)
   Pre-operative Risk Assessment    Patient Name: Tony [name] [facility]  DOB: 1940/11/21 MRN: [id number]      Request for Surgical Clearance    Procedure:   Hernia Repair  Date of Surgery:  Clearance TBD                                 Surgeon:  Michaelle Birks, MD Surgeon's Group or Practice Name:  [facility] Phone number:  [phone] Fax number:  [phone]   Type of Clearance Requested:   - Medical    Type of Anesthesia:  General    Additional requests/questions:  Please advise surgeon/provider what medications should be held.  Signed, Elsie Lincoln Wyndell Cardiff   09/16/2022, 10:18 AM

## 2022-09-16 NOTE — H&P (View-Only) (Signed)
History of Present Illness: Tony [name] is a 82 y.o. male who is seen today for follow up of an umbilical incisional hernia. Since his last visit, he has overall been doing well. He is here today with his daughter. He does not have pain or obstructive symptoms, and is able to reduce the hernia when lying down. However he says the hernia does bother him, and he is very anxious about it and would like to have it repaired. He tried wearing an abdominal binder but says this did not help.    He is on 1L oxygen at night for his COPD. He is able to ambulate and perform ADLs. He reports he walked in from the parking lot today without difficulty. He has a pacemaker for sick sinus syndrome, and prior to his lap chole in September he was stratified as low to moderate risk for periop cardiac complications by his cardiologist. He does not smoke.     Review of Systems: A complete review of systems was obtained from the patient.  I have reviewed this information and discussed as appropriate with the patient.  See HPI as well for other ROS.       Medical History: Past Medical History Past Medical History: Diagnosis Date  Anxiety    COPD (chronic obstructive pulmonary disease) (CMS-HCC)     On home O2 at night  History of cancer    Hyperlipidemia    Hypertension    Sick sinus syndrome (CMS-HCC)    Sleep apnea        Patient Active Problem List Diagnosis  Gallstones  Elevated LFTs     Past Surgical History Past Surgical History: Procedure Laterality Date  CHOLECYSTECTOMY   05/15/2022  APPENDECTOMY          Allergies Allergies Allergen Reactions  Penicillins Rash     Has patient had a PCN reaction causing immediate rash, facial/tongue/throat swelling, SOB or lightheadedness with hypotension: Yes Has patient had a PCN reaction causing severe rash involving mucus membranes or skin necrosis: No Has patient had a PCN reaction that required hospitalization: No Has patient had a PCN reaction  occurring within the last 10 years: No If all of the above answers are "NO", then may proceed with Cephalosporin use. Has patient had a PCN reaction causing immediate rash, facial/tongue/throat swelling, SOB or lightheadedness with hypotension: Yes Has patient had a PCN reaction causing severe rash involving mucus membranes or skin necrosis: No Has patient had a PCN reaction that required hospitalization: No Has patient had a PCN reaction occurring within the last 10 years: No If all of the above answers are "NO", then may proceed with Cephalosporin use.        Current Outpatient Medications on File Prior to Visit Medication Sig Dispense Refill  albuterol 90 mcg/actuation inhaler TAKE 2 PUFFS BY MOUTH EVERY 6 HOURS AS NEEDED FOR WHEEZE OR SHORTNESS OF BREATH      amLODIPine (NORVASC) 2.5 MG tablet Take 2.5 mg by mouth once daily      atorvastatin (LIPITOR) 20 MG tablet Take 1 tablet by mouth once daily      benzonatate (TESSALON) 100 MG capsule Take by mouth      diclofenac (VOLTAREN) 1 % topical gel APPLY 4 GRAMS TOPICALLY 4 TIMES DAILY      FUROsemide (LASIX) 20 MG tablet TAKE 1 TABLET BY MOUITH DAILY FOR 5 DAYS , THEN DAILY AS NEEDED FOR EDEMA      LORazepam (ATIVAN) 1 MG tablet 1 tablet as  needed      metoprolol tartrate (LOPRESSOR) 25 MG tablet Take 1 tablet by mouth 2 (two) times daily      multivitamin with minerals tablet Take by mouth      omeprazole (PRILOSEC) 20 MG DR capsule Take by mouth      predniSONE (DELTASONE) 5 MG tablet Take 5 mg by mouth daily with breakfast       No current facility-administered medications on file prior to visit.     Family History Family History Problem Relation Age of Onset  High blood pressure (Hypertension) Mother    High blood pressure (Hypertension) Father        Social History   Tobacco Use Smoking Status Former  Types: Cigarettes Smokeless Tobacco Never     Social History Social History    Socioeconomic History  Marital  status: Married Tobacco Use  Smoking status: Former     Types: Cigarettes  Smokeless tobacco: Never Substance and Sexual Activity  Alcohol use: Never  Drug use: Never      Objective:     Vitals:   09/16/22 0900 09/16/22 0908 BP: 110/60   Pulse: 60   Temp: 36.6 C (97.8 F)   SpO2: (!) 90%   Weight: 88 kg (194 lb)   Height: 172.7 cm ('5\' 8"'$ )   PainSc:   0-No pain   Body mass index is 29.5 kg/m.   Physical Exam Vitals reviewed.  Constitutional:      General: He is not in acute distress.    Appearance: Normal appearance.  HENT:     Head: Normocephalic and atraumatic.  Eyes:     General: No scleral icterus.    Conjunctiva/sclera: Conjunctivae normal.  Pulmonary:     Effort: Pulmonary effort is normal. No respiratory distress.  Abdominal:     General: There is no distension.     Palpations: Abdomen is soft.     Tenderness: There is no abdominal tenderness.     Comments: Supraumbilical hernia at site of previous port site, very mildly tender but reducible in the supine position with firm manual pressure. Recurs immediately on standing.  Musculoskeletal:     Cervical back: Normal range of motion.  Skin:    General: Skin is warm and dry.  Neurological:     General: No focal deficit present.     Mental Status: He is alert and oriented to person, place, and time.              Assessment and Plan:    Diagnoses and all orders for this visit:   Incisional hernia, without obstruction or gangrene     This is an 82 yo male with an incisional hernia containing small bowel following laparoscopic cholecystectomy. He does not have obstructive symptoms, but is symptomatic and very concerned about the risk of strangulation. The hernia is still reducible, but on imaging review it appears a fairly large loop of small intestine herniates through a small defect, so there is risk of strangulation and obstruction. I again emphasized that he is at increased risk for surgery due to  his underlying heart and lung disease. He expressed understanding and but strongly prefers to proceed with hernia repair. He would also likely do poorly if he required emergency surgery for strangulated or obstructed bowel in the event of hernia incarceration, so I think it is reasonable to proceed with repair. I reviewed the details of umbilical hernia repair with mesh placement, including the benefits and risks of bleeding, infection, and  perioperative cardiopulmonary events. Will again request risk stratification from his cardiologist, although I do not anticipate this will have changed since previous recommendations as he has no new symptoms. If he is not high risk, will proceed with surgery. He should not hold his aspirin and may continue taking it throughout the perioperative period. He will be admitted for overnight observation postoperatively.  Michaelle Birks, [facility], Hepatobiliary and Pancreatic Surgery 09/16/22 1:29 PM

## 2022-09-16 NOTE — Telephone Encounter (Signed)
1st attempt to reach pt regarding surgical clearance and the need for a tele visit.  Left pt a message to call back and ask for the preop team.  

## 2022-09-16 NOTE — H&P (Signed)
History of Present Illness: Tony [name] is a 82 y.o. male who is seen today for follow up of an umbilical incisional hernia. Since his last visit, he has overall been doing well. He is here today with his daughter. He does not have pain or obstructive symptoms, and is able to reduce the hernia when lying down. However he says the hernia does bother him, and he is very anxious about it and would like to have it repaired. He tried wearing an abdominal binder but says this did not help.    He is on 1L oxygen at night for his COPD. He is able to ambulate and perform ADLs. He reports he walked in from the parking lot today without difficulty. He has a pacemaker for sick sinus syndrome, and prior to his lap chole in September he was stratified as low to moderate risk for periop cardiac complications by his cardiologist. He does not smoke.     Review of Systems: A complete review of systems was obtained from the patient.  I have reviewed this information and discussed as appropriate with the patient.  See HPI as well for other ROS.       Medical History: Past Medical History Past Medical History: Diagnosis Date  Anxiety    COPD (chronic obstructive pulmonary disease) (CMS-HCC)     On home O2 at night  History of cancer    Hyperlipidemia    Hypertension    Sick sinus syndrome (CMS-HCC)    Sleep apnea        Patient Active Problem List Diagnosis  Gallstones  Elevated LFTs     Past Surgical History Past Surgical History: Procedure Laterality Date  CHOLECYSTECTOMY   05/15/2022  APPENDECTOMY          Allergies Allergies Allergen Reactions  Penicillins Rash     Has patient had a PCN reaction causing immediate rash, facial/tongue/throat swelling, SOB or lightheadedness with hypotension: Yes Has patient had a PCN reaction causing severe rash involving mucus membranes or skin necrosis: No Has patient had a PCN reaction that required hospitalization: No Has patient had a PCN reaction  occurring within the last 10 years: No If all of the above answers are "NO", then may proceed with Cephalosporin use. Has patient had a PCN reaction causing immediate rash, facial/tongue/throat swelling, SOB or lightheadedness with hypotension: Yes Has patient had a PCN reaction causing severe rash involving mucus membranes or skin necrosis: No Has patient had a PCN reaction that required hospitalization: No Has patient had a PCN reaction occurring within the last 10 years: No If all of the above answers are "NO", then may proceed with Cephalosporin use.        Current Outpatient Medications on File Prior to Visit Medication Sig Dispense Refill  albuterol 90 mcg/actuation inhaler TAKE 2 PUFFS BY MOUTH EVERY 6 HOURS AS NEEDED FOR WHEEZE OR SHORTNESS OF BREATH      amLODIPine (NORVASC) 2.5 MG tablet Take 2.5 mg by mouth once daily      atorvastatin (LIPITOR) 20 MG tablet Take 1 tablet by mouth once daily      benzonatate (TESSALON) 100 MG capsule Take by mouth      diclofenac (VOLTAREN) 1 % topical gel APPLY 4 GRAMS TOPICALLY 4 TIMES DAILY      FUROsemide (LASIX) 20 MG tablet TAKE 1 TABLET BY MOUITH DAILY FOR 5 DAYS , THEN DAILY AS NEEDED FOR EDEMA      LORazepam (ATIVAN) 1 MG tablet 1 tablet as  needed      metoprolol tartrate (LOPRESSOR) 25 MG tablet Take 1 tablet by mouth 2 (two) times daily      multivitamin with minerals tablet Take by mouth      omeprazole (PRILOSEC) 20 MG DR capsule Take by mouth      predniSONE (DELTASONE) 5 MG tablet Take 5 mg by mouth daily with breakfast       No current facility-administered medications on file prior to visit.     Family History Family History Problem Relation Age of Onset  High blood pressure (Hypertension) Mother    High blood pressure (Hypertension) Father        Social History   Tobacco Use Smoking Status Former  Types: Cigarettes Smokeless Tobacco Never     Social History Social History    Socioeconomic History  Marital  status: Married Tobacco Use  Smoking status: Former     Types: Cigarettes  Smokeless tobacco: Never Substance and Sexual Activity  Alcohol use: Never  Drug use: Never      Objective:     Vitals:   09/16/22 0900 09/16/22 0908 BP: 110/60   Pulse: 60   Temp: 36.6 C (97.8 F)   SpO2: (!) 90%   Weight: 88 kg (194 lb)   Height: 172.7 cm ('5\' 8"'$ )   PainSc:   0-No pain   Body mass index is 29.5 kg/m.   Physical Exam Vitals reviewed.  Constitutional:      General: He is not in acute distress.    Appearance: Normal appearance.  HENT:     Head: Normocephalic and atraumatic.  Eyes:     General: No scleral icterus.    Conjunctiva/sclera: Conjunctivae normal.  Pulmonary:     Effort: Pulmonary effort is normal. No respiratory distress.  Abdominal:     General: There is no distension.     Palpations: Abdomen is soft.     Tenderness: There is no abdominal tenderness.     Comments: Supraumbilical hernia at site of previous port site, very mildly tender but reducible in the supine position with firm manual pressure. Recurs immediately on standing.  Musculoskeletal:     Cervical back: Normal range of motion.  Skin:    General: Skin is warm and dry.  Neurological:     General: No focal deficit present.     Mental Status: He is alert and oriented to person, place, and time.              Assessment and Plan:    Diagnoses and all orders for this visit:   Incisional hernia, without obstruction or gangrene     This is an 82 yo male with an incisional hernia containing small bowel following laparoscopic cholecystectomy. He does not have obstructive symptoms, but is symptomatic and very concerned about the risk of strangulation. The hernia is still reducible, but on imaging review it appears a fairly large loop of small intestine herniates through a small defect, so there is risk of strangulation and obstruction. I again emphasized that he is at increased risk for surgery due to  his underlying heart and lung disease. He expressed understanding and but strongly prefers to proceed with hernia repair. He would also likely do poorly if he required emergency surgery for strangulated or obstructed bowel in the event of hernia incarceration, so I think it is reasonable to proceed with repair. I reviewed the details of umbilical hernia repair with mesh placement, including the benefits and risks of bleeding, infection, and  perioperative cardiopulmonary events. Will again request risk stratification from his cardiologist, although I do not anticipate this will have changed since previous recommendations as he has no new symptoms. If he is not high risk, will proceed with surgery. He should not hold his aspirin and may continue taking it throughout the perioperative period. He will be admitted for overnight observation postoperatively.  Michaelle Birks, [facility], Hepatobiliary and Pancreatic Surgery 09/16/22 1:29 PM

## 2022-09-16 NOTE — Telephone Encounter (Signed)
   Name: Tony [name], Tony [name]  DOB: 1941/03/26  MRN: [id number]  Primary Cardiologist: Sanda Klein, MD   Preoperative team, please contact this patient and set up a phone call appointment for further preoperative risk assessment. Please obtain consent and complete medication review. Thank you for your help.  I confirm that guidance regarding antiplatelet and oral anticoagulation therapy has been completed and, if necessary, noted below.   Okay to stop aspirin for 5 to 7 days if desired but not requested  Tony [name], Tony Nestle, NP 09/16/2022, 10:44 AM [place]

## 2022-09-17 NOTE — Telephone Encounter (Signed)
DPR ok to s/w the pt's wife who scheduled a tele visit for the pt 09/23/22 @ 9:20. Med rec and consent are done.

## 2022-09-17 NOTE — Telephone Encounter (Signed)
DPR ok to s/w the pt's wife who scheduled a tele visit for the pt 09/23/22 @ 9:20. Med rec and consent are done.     Patient Consent for Virtual Visit        Tony [name] has provided verbal consent on 09/17/2022 for a virtual visit (video or telephone).   CONSENT FOR VIRTUAL VISIT FOR:  Tony [name]  By participating in this virtual visit I agree to the following:  I hereby voluntarily request, consent and authorize [facility] and its employed or contracted physicians, physician assistants, nurse practitioners or other licensed health care professionals (the Practitioner), to provide me with telemedicine health care services (the "Services") as deemed necessary by the treating Practitioner. I acknowledge and consent to receive the Services by the Practitioner via telemedicine. I understand that the telemedicine visit will involve communicating with the Practitioner through live audiovisual communication technology and the disclosure of certain medical information by electronic transmission. I acknowledge that I have been given the opportunity to request an in-person assessment or other available alternative prior to the telemedicine visit and am voluntarily participating in the telemedicine visit.  I understand that I have the right to withhold or withdraw my consent to the use of telemedicine in the course of my care at any time, without affecting my right to future care or treatment, and that the Practitioner or I may terminate the telemedicine visit at any time. I understand that I have the right to inspect all information obtained and/or recorded in the course of the telemedicine visit and may receive copies of available information for a reasonable fee.  I understand that some of the potential risks of receiving the Services via telemedicine include:  Delay or interruption in medical evaluation due to technological equipment failure or disruption; Information  transmitted may not be sufficient (e.g. poor resolution of images) to allow for appropriate medical decision making by the Practitioner; and/or  In rare instances, security protocols could fail, causing a breach of personal health information.  Furthermore, I acknowledge that it is my responsibility to provide information about my medical history, conditions and care that is complete and accurate to the best of my ability. I acknowledge that Practitioner's advice, recommendations, and/or decision may be based on factors not within their control, such as incomplete or inaccurate data provided by me or distortions of diagnostic images or specimens that may result from electronic transmissions. I understand that the practice of medicine is not an exact science and that Practitioner makes no warranties or guarantees regarding treatment outcomes. I acknowledge that a copy of this consent can be made available to me via my patient portal ([facility]), or I can request a printed copy by calling the office of [facility].    I understand that my insurance will be billed for this visit.   I have read or had this consent read to me. I understand the contents of this consent, which adequately explains the benefits and risks of the Services being provided via telemedicine.  I have been provided ample opportunity to ask questions regarding this consent and the Services and have had my questions answered to my satisfaction. I give my informed consent for the services to be provided through the use of telemedicine in my medical care

## 2022-09-19 ENCOUNTER — Other Ambulatory Visit: Payer: Self-pay | Admitting: Family Medicine

## 2022-09-19 DIAGNOSIS — F064 Anxiety disorder due to known physiological condition: Secondary | ICD-10-CM

## 2022-09-21 ENCOUNTER — Other Ambulatory Visit: Payer: Self-pay | Admitting: Family Medicine

## 2022-09-21 DIAGNOSIS — I10 Essential (primary) hypertension: Secondary | ICD-10-CM

## 2022-09-23 ENCOUNTER — Ambulatory Visit: Payer: Medicare Other | Attending: Internal Medicine | Admitting: Physician Assistant

## 2022-09-23 DIAGNOSIS — Z01818 Encounter for other preprocedural examination: Secondary | ICD-10-CM | POA: Diagnosis not present

## 2022-09-23 NOTE — Progress Notes (Signed)
Virtual Visit via Telephone Note   Because of Tony [name]'s co-morbid illnesses, he is at least at moderate risk for complications without adequate follow up.  This format is felt to be most appropriate for this patient at this time.  The patient did not have access to video technology/had technical difficulties with video requiring transitioning to audio format only (telephone).  All issues noted in this document were discussed and addressed.  No physical exam could be performed with this format.  Please refer to the patient's chart for his consent to telehealth for [name]  Evaluation Performed:  Preoperative cardiovascular risk assessment _____________   Date:  09/23/2022   Patient ID:  Tony [name], DOB [birth year], MRN [id number] Patient Location:  Home Provider location:   Office  Primary Care Provider:  [name], [name], MD Primary Cardiologist:  Sanda Klein, MD  Chief Complaint / Patient Profile   82 y.o. y/o male with a h/o COPD, GERD, Hypertension, SSS who is pending hernia repair and presents today for telephonic preoperative cardiovascular risk assessment.  History of Present Illness    Tony [name] is a 82 y.o. male who presents via audio/video conferencing for a telehealth visit today.  Pt was last seen in cardiology clinic on 04/13/2022 by Dr. Sallyanne Kuster  At that time Tony [name] was doing well without chest pain.  The patient is now pending procedure as outlined above. Since his last visit, he tells me that he has not had any issues with his heart.  He states that his permanent pacemaker has really helped.  He walks to the mailbox and back without any symptoms.  He was doing some exercises however him and his family had the flu and finally his symptoms have resolved.  He is scored a 5.07 on the DASI.  This exceeds the minimum 4 METS requirement.   No medications are indicated as needing held.   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, PSA 15.65   Radiation    Sinus problem    SSS (sick sinus syndrome) ([place])    Past Surgical History:  Procedure Laterality Date   APPENDECTOMY  2019   hx hematoma s/p appendectomy   CHOLECYSTECTOMY N/A 05/15/2022   Procedure: LAPAROSCOPIC CHOLECYSTECTOMY;  Surgeon: [name], MD;  Location: [facility];  Service: General;  Laterality: N/A;   ESOPHAGOGASTRODUODENOSCOPY (EGD) WITH PROPOFOL N/A 02/03/2018   Procedure: ESOPHAGOGASTRODUODENOSCOPY (EGD) WITH PROPOFOL;  Surgeon: Clarene Essex, MD;  Location: [place];  Service: Endoscopy;  Laterality: N/A;   FRACTURE SURGERY     INSERT / REPLACE / REMOVE PACEMAKER  06/14/2018   LAPAROSCOPIC APPENDECTOMY N/A 01/21/2018   Procedure: APPENDECTOMY LAPAROSCOPIC;  Surgeon: Kinsinger, Arta Bruce, MD;  Location: [place];  Service: General;  Laterality: N/A;   PACEMAKER IMPLANT N/A 06/14/2018   Procedure: PACEMAKER IMPLANT - Dual Chamber;  Surgeon: Sanda Klein, MD;  Location: [place] [facility];  Service: Cardiovascular;  Laterality: N/A;   PROSTATE BIOPSY  02/08/14   gleason  4+5=9, 12/12 cores positive, 54 gm   RADIOACTIVE SEED IMPLANT N/A 06/29/2014   Procedure: RADIOACTIVE SEED IMPLANT;  Surgeon: Bernestine Amass, MD;  Location: [facility];  Service: Urology;  Laterality: N/A;   WRIST FRACTURE SURGERY Right 1980s    Allergies  Allergies  Allergen Reactions   Penicillins Rash    Has patient had a PCN reaction causing immediate rash, facial/tongue/throat swelling, SOB or lightheadedness with hypotension: Yes Has  patient had a PCN reaction causing severe rash involving mucus membranes or skin necrosis: No Has patient had a PCN reaction that required hospitalization: No Has patient had a PCN reaction occurring within the last 10 years: No If all of the above answers are "NO", then may proceed with Cephalosporin use.    Home Medications    Prior to Admission medications   Medication Sig Start Date End Date Taking? Authorizing Provider  acetaminophen (TYLENOL) 500 MG tablet Take 1,000 mg by mouth every 6 (six) hours as needed for headache or moderate pain (pain).    [provider]  albuterol (VENTOLIN HFA) 108 (90 Base) MCG/ACT inhaler TAKE 2 PUFFS BY MOUTH EVERY 6 HOURS AS NEEDED FOR WHEEZE OR SHORTNESS OF BREATH 07/15/22   Young, Clinton D, MD  ALPRAZolam (XANAX) 1 MG tablet TAKE 0.5-1 TABLETS (0.5-1 MG TOTAL) BY MOUTH 2 (TWO) TIMES DAILY AS NEEDED FOR ANXIETY. 09/21/22   [name], [name], MD  aspirin EC 81 MG tablet Take 81 mg by mouth daily.    [provider]  atorvastatin (LIPITOR) 20 MG tablet Take 1 tablet (20 mg total) by mouth daily. 05/12/22   [name], [name], MD  benzonatate (TESSALON) 100 MG capsule TAKE 1 CAPSULE BY MOUTH DAILY AS NEEDED. 08/19/22   [name], [name], MD  budesonide-formoterol [facility]) 160-4.5 MCG/ACT inhaler Inhale 2 puffs then rinse mouth, twice daily Patient taking differently: Inhale 2 puffs into the lungs 2 (two) times daily. Inhale 2 puffs then rinse mouth, twice daily 04/16/21   Baird Lyons D, MD  citalopram (CELEXA) 10 MG tablet Take 1 tablet (10 mg total) by mouth daily. 07/13/22   [name], [name], MD  diclofenac Sodium (VOLTAREN) 1 % GEL AAPLY 4 GRAMS TOPICALLY 4 TIMES DAILY Patient taking differently: Apply 1 Application topically 4 (four) times daily as needed (pain). 02/14/21   [name], [name], MD  enzalutamide Gillermina Phy) 40 MG tablet Take 4 tablets (160 mg total) by mouth daily. 07/16/22   Wyatt Portela, MD  fluticasone (FLONASE) 50 MCG/ACT nasal  spray PLACE 1 SPRAY INTO BOTH NOSTRILS DAILY AS NEEDED FOR ALLERGIES OR RHINITIS (AT BEDTIME). 09/04/19   [name], [name], MD  furosemide (LASIX) 20 MG tablet TAKE 1 TABLET BY MOUTH DAILY 07/27/22   [name], [name], MD  magnesium hydroxide (MILK OF MAGNESIA) 400 MG/5ML suspension Take by mouth daily as needed for mild constipation.    [provider]  Melatonin 10 MG CAPS Take 20 mg by mouth at bedtime.    [provider]  metoprolol tartrate (LOPRESSOR) 25 MG tablet Take 1 tablet (25 mg total) by mouth 2 (two) times daily. 05/12/22   [name], [name], MD  Multiple Vitamin (MULTIVITAMIN WITH MINERALS) TABS tablet Take 0.5 tablets by mouth daily.    [provider]  omeprazole (PRILOSEC) 20 MG capsule TAKE 1 CAPSULE BY MOUTH DAILY BEFORE BREAKFAST. 05/13/22   [name], [name], MD  Tiotropium Bromide Monohydrate (SPIRIVA RESPIMAT) 2.5 MCG/ACT AERS Inhale 2 puffs into the lungs daily.  06/23/22   [name], [name], MD    Physical Exam    Vital Signs:  Tony [name] does not have vital signs available for review today.  Given telephonic nature of communication, physical exam is limited. AAOx3. NAD. Normal affect.  Speech and respirations are unlabored.  Accessory Clinical Findings    None  Assessment & Plan    1.  Preoperative Cardiovascular Risk Assessment:  Tony [name]'s perioperative risk of a major cardiac event is 0.9% according to the Revised Cardiac Risk Index (RCRI).  Therefore, he is at low risk for perioperative complications.   His functional capacity is good at 5.07 METs according to the Duke Activity Status Index (DASI). Recommendations: According to ACC/AHA guidelines, no further cardiovascular testing needed.  The patient may proceed to surgery at acceptable risk.    The patient was advised that if he develops new symptoms prior to surgery to contact our office to arrange for a follow-up visit, and he verbalized understanding.   A copy of this  note will be routed to requesting surgeon.  Time:   Today, I have spent 7 minutes with the patient with telehealth technology discussing medical history, symptoms, and management plan.     Elgie Collard, PA-C  09/23/2022, 7:48 AM

## 2022-09-25 ENCOUNTER — Telehealth: Payer: Self-pay | Admitting: Oncology

## 2022-09-25 NOTE — Telephone Encounter (Signed)
Called patient regarding providers departure, patient is notified. Patient has been rescheduled with new provider.

## 2022-10-01 ENCOUNTER — Other Ambulatory Visit: Payer: Self-pay | Admitting: Family Medicine

## 2022-10-07 ENCOUNTER — Encounter: Payer: Self-pay | Admitting: Cardiovascular Disease

## 2022-10-07 NOTE — Progress Notes (Unsigned)
PERIOPERATIVE PRESCRIPTION FOR IMPLANTED CARDIAC DEVICE PROGRAMMING  Patient Information: Name:  Tony [name]  DOB:  05/20/1941  MRN:  [id number]  {TIP - You do not have to delete this tip  -  Copy the info from the staff message sent by the PAT staff  then press F2 here and paste the information using CTL - V on the next line :[id number]}  Planned Procedure: Incisional hernia repair with mesh.  Surgeon:  Dr. Michaelle Birks  Date of Procedure:  10/14/22  Cautery will be used.  Position during surgery:    Please send documentation back to:  [facility] (Fax # [phone])   Device Information: Clinic EP Physician:  Dr. Sallyanne Kuster   Device Type:  Pacemaker Manufacturer and Phone #:  Medtronic: [id number] Pacemaker Dependent?:  No. Date of Last Device Check:  09/14/22 Remote Normal Device Function?:  Yes.    Electrophysiologist's Recommendations:  Have magnet available. Provide continuous ECG monitoring when magnet is used or reprogramming is to be performed.  Procedure should not interfere, no device reprogramming or magnet use needed   Per Device Clinic Standing Orders, Wanda Plump, RN  10:59 AM 10/07/2022

## 2022-10-07 NOTE — Patient Instructions (Addendum)
DUE TO COVID-19 ONLY TWO VISITORS  (aged 81 and older)  ARE ALLOWED TO COME WITH YOU AND STAY IN THE WAITING ROOM ONLY DURING PRE OP AND PROCEDURE.   **NO VISITORS ARE ALLOWED IN THE SHORT STAY AREA OR RECOVERY ROOM!!**  IF YOU WILL BE ADMITTED INTO THE HOSPITAL YOU ARE ALLOWED ONLY FOUR SUPPORT PEOPLE DURING VISITATION HOURS ONLY (7 AM -8PM)   The support person(s) must pass our screening, gel in and out, and wear a mask at all times, including in the patient's room. Patients must also wear a mask when staff or their support person are in the room. Visitors GUEST BADGE MUST BE WORN VISIBLY  One adult visitor may remain with you overnight and MUST be in the room by 8 P.M.     Your procedure is scheduled on: 10/14/22   Report to [facility] Main Entrance    Report to admitting at : 6:15 AM   Call this number if you have problems the morning of surgery [phone]   Do not eat food :After Midnight.   After Midnight you may have the following liquids until : 5:30 AM DAY OF SURGERY  Water Black Coffee (sugar ok, NO MILK/CREAM OR CREAMERS)  Tea (sugar ok, NO MILK/CREAM OR CREAMERS) regular and decaf                             Plain Jell-O (NO RED)                                           Fruit ices (not with fruit pulp, NO RED)                                     Popsicles (NO RED)                                                                  Juice: apple, WHITE grape, WHITE cranberry Sports drinks like Gatorade (NO RED)              Oral Hygiene is also important to reduce your risk of infection.                                    Remember - BRUSH YOUR TEETH THE MORNING OF SURGERY WITH YOUR REGULAR TOOTHPASTE  DENTURES WILL BE REMOVED PRIOR TO SURGERY PLEASE DO NOT APPLY "Poly grip" OR ADHESIVES!!!   Do NOT smoke after Midnight   Take these medicines the morning of surgery with A SIP OF WATER: citalopram,enzalutamide(Xtandi),metoprolol,omeprazole.Inhalers as  usual.Tylenol,alprazolam as needed.                              You may not have any metal on your body including hair pins, jewelry, and body piercing             Do not wear lotions, powders, perfumes/cologne, or deodorant  Men may shave face and neck.   Do not bring valuables to the hospital. [place].   Contacts, glasses, or bridgework may not be worn into surgery.   Bring small overnight bag day of surgery.   DO NOT [place]. PHARMACY WILL DISPENSE MEDICATIONS LISTED ON YOUR MEDICATION LIST TO YOU DURING YOUR ADMISSION [place]!    Patients discharged on the day of surgery will not be allowed to drive home.  Someone NEEDS to stay with you for the first 24 hours after anesthesia.   Special Instructions: Bring a copy of your healthcare power of attorney and living will documents         the day of surgery if you haven't scanned them before.              Please read over the following fact sheets you were given: IF YOU HAVE QUESTIONS ABOUT YOUR PRE-OP INSTRUCTIONS PLEASE CALL [phone]    [facility] - Preparing for Surgery Before surgery, you can play an important role.  Because skin is not sterile, your skin needs to be as free of germs as possible.  You can reduce the number of germs on your skin by washing with CHG (chlorahexidine gluconate) soap before surgery.  CHG is an antiseptic cleaner which kills germs and bonds with the skin to continue killing germs even after washing. Please DO NOT use if you have an allergy to CHG or antibacterial soaps.  If your skin becomes reddened/irritated stop using the CHG and inform your nurse when you arrive at Short Stay. Do not shave (including legs and underarms) for at least 48 hours prior to the first CHG shower.  You may shave your face/neck. Please follow these instructions carefully:  1.  Shower with CHG Soap the night before  surgery and the  morning of Surgery.  2.  If you choose to wash your hair, wash your hair first as usual with your  normal  shampoo.  3.  After you shampoo, rinse your hair and body thoroughly to remove the  shampoo.                           4.  Use CHG as you would any other liquid soap.  You can apply chg directly  to the skin and wash                       Gently with a scrungie or clean washcloth.  5.  Apply the CHG Soap to your body ONLY FROM THE NECK DOWN.   Do not use on face/ open                           Wound or open sores. Avoid contact with eyes, ears mouth and genitals (private parts).                       Wash face,  Genitals (private parts) with your normal soap.             6.  Wash thoroughly, paying special attention to the area where your surgery  will be performed.  7.  Thoroughly rinse your body with warm water from the neck down.  8.  DO  NOT shower/wash with your normal soap after using and rinsing off  the CHG Soap.                9.  Pat yourself dry with a clean towel.            10.  Wear clean pajamas.            11.  Place clean sheets on your bed the night of your first shower and do not  sleep with pets. Day of Surgery : Do not apply any lotions/deodorants the morning of surgery.  Please wear clean clothes to the hospital/surgery center.  FAILURE TO FOLLOW THESE INSTRUCTIONS MAY RESULT IN THE CANCELLATION OF YOUR SURGERY PATIENT SIGNATURE_________________________________  NURSE SIGNATURE__________________________________  ________________________________________________________________________

## 2022-10-08 ENCOUNTER — Encounter (HOSPITAL_COMMUNITY): Payer: Self-pay

## 2022-10-08 ENCOUNTER — Encounter (HOSPITAL_COMMUNITY)
Admission: RE | Admit: 2022-10-08 | Discharge: 2022-10-08 | Disposition: A | Discharge status: Home / Self Care | Payer: Medicare Other | Source: Ambulatory Visit | Attending: Surgery | Admitting: Surgery

## 2022-10-08 ENCOUNTER — Other Ambulatory Visit: Payer: Self-pay

## 2022-10-08 VITALS — BP 153/86 | HR 76 | Temp 97.7°F | Ht 68.0 in | Wt 194.0 lb

## 2022-10-08 DIAGNOSIS — I495 Sick sinus syndrome: Secondary | ICD-10-CM | POA: Insufficient documentation

## 2022-10-08 DIAGNOSIS — J449 Chronic obstructive pulmonary disease, unspecified: Secondary | ICD-10-CM | POA: Diagnosis not present

## 2022-10-08 DIAGNOSIS — K219 Gastro-esophageal reflux disease without esophagitis: Secondary | ICD-10-CM | POA: Insufficient documentation

## 2022-10-08 DIAGNOSIS — Z87891 Personal history of nicotine dependence: Secondary | ICD-10-CM | POA: Diagnosis not present

## 2022-10-08 DIAGNOSIS — I10 Essential (primary) hypertension: Secondary | ICD-10-CM | POA: Insufficient documentation

## 2022-10-08 DIAGNOSIS — Z950 Presence of cardiac pacemaker: Secondary | ICD-10-CM | POA: Insufficient documentation

## 2022-10-08 DIAGNOSIS — K432 Incisional hernia without obstruction or gangrene: Secondary | ICD-10-CM | POA: Diagnosis not present

## 2022-10-08 DIAGNOSIS — I2510 Atherosclerotic heart disease of native coronary artery without angina pectoris: Secondary | ICD-10-CM | POA: Diagnosis not present

## 2022-10-08 DIAGNOSIS — Z01812 Encounter for preprocedural laboratory examination: Secondary | ICD-10-CM | POA: Insufficient documentation

## 2022-10-08 HISTORY — DX: Unspecified osteoarthritis, unspecified site: M19.90

## 2022-10-08 LAB — BASIC METABOLIC PANEL
Anion gap: 10 (ref 5–15)
BUN: 17 mg/dL (ref 8–23)
CO2: 23 mmol/L (ref 22–32)
Calcium: 9.2 mg/dL (ref 8.9–10.3)
Chloride: 104 mmol/L (ref 98–111)
Creatinine, Ser: 0.9 mg/dL (ref 0.61–1.24)
GFR, Estimated: >60 mL/min (ref >60)
Glucose, Bld: 116 mg/dL — ABNORMAL HIGH (ref 70–99)
Potassium: 4.6 mmol/L (ref 3.5–5.1)
Sodium: 137 mmol/L (ref 135–145)

## 2022-10-08 LAB — CBC
HCT: 42.1 % (ref 39.0–52.0)
Hemoglobin: 13.8 g/dL (ref 13.0–17.0)
MCH: 29.9 pg (ref 26.0–34.0)
MCHC: 32.8 g/dL (ref 30.0–36.0)
MCV: 91.3 fL (ref 80.0–100.0)
Platelets: 194 10*3/uL (ref 150–400)
RBC: 4.61 MIL/uL (ref 4.22–5.81)
RDW: 16 % — ABNORMAL HIGH (ref 11.5–15.5)
WBC: 5.6 10*3/uL (ref 4.0–10.5)
nRBC: 0 % (ref 0.0–0.2)

## 2022-10-08 NOTE — Progress Notes (Addendum)
For Short Stay: [place] appointment date:  Bowel Prep reminder:   For Anesthesia: PCP - Dr. [name] Cardiologist - Dr. Dani Gobble Croitoru Clearance: Nicholes Rough: 09/23/22 Chest x-ray - 12/24/21 EKG - 04/13/22 Stress Test -  ECHO - 07/14/21 Cardiac Cath -  Pacemaker/ICD device last checked: 09/14/22 Pacemaker orders received: Yes: EPIC  Device Rep notified: N/A  Spinal Cord Stimulator:  Sleep Study - N/A CPAP -   Fasting Blood Sugar - N/A Checks Blood Sugar _____ times a day Date and result of last Hgb A1c-  Last dose of GLP1 agonist-  GLP1 instructions:   Last dose of SGLT-2 inhibitors-  SGLT-2 instructions:   Blood Thinner Instructions: Aspirin Instructions: No instructions,pt. Will call cardiologist. Last Dose:  Activity level: Can go up a flight of stairs and activities of daily living without stopping and without chest pain and/or shortness of breath   Able to exercise without chest pain and/or shortness of breath  Anesthesia review: Hx: COPD,CAD,HTN  Patient denies shortness of breath, fever, cough and chest pain at PAT appointment   Patient verbalized understanding of instructions that were given to them at the PAT appointment. Patient was also instructed that they will need to review over the PAT instructions again at home before surgery.

## 2022-10-09 ENCOUNTER — Telehealth: Payer: Self-pay | Admitting: Cardiovascular Disease

## 2022-10-09 NOTE — Progress Notes (Signed)
Anesthesia Chart Review   Case: [id number] Date/Time: 10/14/22 0815   Procedure: INCISIONAL HERNIA REPAIR WITH MESH   Anesthesia type: General   Pre-op diagnosis: INCISIONAL HERNIA   Location: [facility] ROOM 08 / [facility]   Surgeons: [name], MD       DISCUSSION:82 y.o. former smoker with h/o HTN, GERD, COPD, CAD, SSS with pacemaker in place (device orders in 10/07/2022 progress note), incisional hernia scheduled for above procedure 10/14/2022 with Dr. Michaelle Birks.   Pt last seen by cardiology 09/23/2022. Per OV note, "Mr. Franqui's perioperative risk of a major cardiac event is 0.9% according to the Revised Cardiac Risk Index (RCRI).  Therefore, he is at low risk for perioperative complications.   His functional capacity is good at 5.07 METs according to the Duke Activity Status Index (DASI). Recommendations: According to ACC/AHA guidelines, no further cardiovascular testing needed.  The patient may proceed to surgery at acceptable risk.     The patient was advised that if he develops new symptoms prior to surgery to contact our office to arrange for a follow-up visit, and he verbalized understanding."  Anticipate pt can proceed with planned procedure barring acute status change.   VS: BP (!) 153/86   Pulse 76   Temp 36.5 C (Oral)   Ht '5\' 8"'$  (1.727 m)   Wt 88 kg   SpO2 95%   BMI 29.50 kg/m   PROVIDERS: [name], [name], MD is PCP   Cardiologist - Dr. Dani Gobble Croitoru  LABS: Labs reviewed: Acceptable for surgery. (all labs ordered are listed, but only abnormal results are displayed)  Labs Reviewed  CBC - Abnormal; Notable for the following components:      Result Value   RDW 16.0 (*)    All other components within normal limits  BASIC METABOLIC PANEL - Abnormal; Notable for the following components:   Glucose, Bld 116 (*)    All other components within normal limits     IMAGES:   EKG:   CV: Echo 07/14/2021  1. Left ventricular ejection fraction, by estimation, is 60  to 65%. The  left ventricle has normal function. The left ventricle has no regional  wall motion abnormalities. Left ventricular diastolic parameters were  normal.   2. Right ventricular systolic function is normal. The right ventricular  size is normal.   3. The mitral valve is normal in structure. No evidence of mitral valve  regurgitation. No evidence of mitral stenosis.   4. The aortic valve is calcified. Aortic valve regurgitation is trivial.  Mild aortic valve sclerosis is present, with no evidence of aortic valve  stenosis.   5. Aortic dilatation noted. There is mild dilatation of the aortic root,  measuring 40 mm. There is mild dilatation of the ascending aorta,  measuring 43 mm.   6. The inferior vena cava is normal in size with greater than 50%  respiratory variability, suggesting right atrial pressure of 3 mmHg.  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   Radiation  Sinus problem    SSS (sick sinus syndrome) ([place])     Past Surgical History:  Procedure Laterality Date   APPENDECTOMY  2019   hx hematoma s/p appendectomy   CHOLECYSTECTOMY N/A 05/15/2022   Procedure: LAPAROSCOPIC CHOLECYSTECTOMY;  Surgeon: [name], MD;  Location: [facility];  Service: General;  Laterality: N/A;   ESOPHAGOGASTRODUODENOSCOPY (EGD) WITH PROPOFOL N/A 02/03/2018   Procedure: ESOPHAGOGASTRODUODENOSCOPY (EGD) WITH PROPOFOL;  Surgeon: Clarene Essex, MD;  Location: [place];  Service: Endoscopy;  Laterality: N/A;   FRACTURE SURGERY     INSERT / REPLACE / REMOVE PACEMAKER  06/14/2018    LAPAROSCOPIC APPENDECTOMY N/A 01/21/2018   Procedure: APPENDECTOMY LAPAROSCOPIC;  Surgeon: Kinsinger, Arta Bruce, MD;  Location: [place];  Service: General;  Laterality: N/A;   PACEMAKER IMPLANT N/A 06/14/2018   Procedure: PACEMAKER IMPLANT - Dual Chamber;  Surgeon: Sanda Klein, MD;  Location: [place] [facility];  Service: Cardiovascular;  Laterality: N/A;   PROSTATE BIOPSY  02/08/14   gleason 4+5=9, 12/12 cores positive, 54 gm   RADIOACTIVE SEED IMPLANT N/A 06/29/2014   Procedure: RADIOACTIVE SEED IMPLANT;  Surgeon: Bernestine Amass, MD;  Location: [facility];  Service: Urology;  Laterality: N/A;   WRIST FRACTURE SURGERY Right 1980s    MEDICATIONS:  albuterol (VENTOLIN HFA) 108 (90 Base) MCG/ACT inhaler   ALPRAZolam (XANAX) 1 MG tablet   aspirin EC 81 MG tablet   atorvastatin (LIPITOR) 20 MG tablet   benzonatate (TESSALON) 100 MG capsule   citalopram (CELEXA) 10 MG tablet   diclofenac Sodium (VOLTAREN) 1 % GEL   enzalutamide (XTANDI) 40 MG tablet   fluticasone (FLONASE) 50 MCG/ACT nasal spray   furosemide (LASIX) 20 MG tablet   magnesium hydroxide (MILK OF MAGNESIA) 400 MG/5ML suspension   Melatonin 10 MG CAPS   metoprolol tartrate (LOPRESSOR) 25 MG tablet   Multiple Vitamin (MULTIVITAMIN WITH MINERALS) TABS tablet   omeprazole (PRILOSEC) 20 MG capsule   Tiotropium Bromide Monohydrate (SPIRIVA RESPIMAT) 2.5 MCG/ACT AERS   No current facility-administered medications for this encounter.   Konrad Felix Ward, PA-C [facility] [phone]

## 2022-10-09 NOTE — Progress Notes (Unsigned)
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 He was last seen on 07/13/2022.  Incisional hernia: He has pain around affected area with coughing spells. Planning on having hernia repair on 10/14/2022. He has been already cleared by his cardiologist.  Hypertension:  Medications:Metoprolol Tartrate 25 mg bid He is not checking BPs at home. Negative for unusual or severe headache, visual changes, exertional chest pain,  focal weakness, or worsening edema. Lab Results  Component Value Date   CREATININE 0.90 10/08/2022   BUN 17 10/08/2022   NA 137 10/08/2022   K 4.6 10/08/2022   CL 104 10/08/2022   CO2 23 10/08/2022   Anxiety and insomnia: He is on Lorazepam 1 mg bid prn and Celexa 10 mg daily. Anxiety is well controlled. Insomnia is aggravated by nocturia, taking his dog out 2-3 times, and persistent cough.  Chronic cough: Benzonatate 100 mg daily at bedtime as needed seems to help with cough. He wears his BiPAP with O2 supplementation 3 L/min, but he has not consistently.  COPD: SOB stable. No associated wheezing. Follows with pulmonologist, next appt in 02/2023. He is on Spiriva 25-25 mcg bid.  Prostate cancer, follows with oncology regularly. Dr. [name] is retiring, he has an appointment with Dr. Burr Medico.  Advanced prostate cancer with lymphadenopathy diagnosed in 2017.  Status post radiation therapy for external beam radiation completed September 2015. Status post seed implant brachytherapy boost completed in 06/2014. Currently he is on Eligard (androgen deprivation therapy) every 3 months and Xtadi.  Review of Systems  Constitutional:  Positive for fatigue. Negative for activity change, appetite change and fever.  HENT:  Negative for mouth sores and sore throat.   Gastrointestinal:   Negative for abdominal pain, nausea and vomiting.  Genitourinary:  Negative for decreased urine volume, dysuria and hematuria.  Musculoskeletal:  Positive for arthralgias and gait problem.  Skin:  Negative for rash.  Neurological:  Negative for syncope and facial asymmetry.  Psychiatric/Behavioral:  Positive for sleep disturbance. Negative for confusion and hallucinations.   Rest see pertinent positives and negatives per HPI.  Current Outpatient Medications on File Prior to Visit  Medication Sig Dispense Refill   albuterol (VENTOLIN HFA) 108 (90 Base) MCG/ACT inhaler TAKE 2 PUFFS BY MOUTH EVERY 6 HOURS AS NEEDED FOR WHEEZE OR SHORTNESS OF BREATH 8.5 each 12   ALPRAZolam (XANAX) 1 MG tablet TAKE 0.5-1 TABLETS (0.5-1 MG TOTAL) BY MOUTH 2 (TWO) TIMES DAILY AS NEEDED FOR ANXIETY. 60 tablet 2   aspirin EC 81 MG tablet Take 81 mg by mouth daily.     atorvastatin (LIPITOR) 20 MG tablet TAKE 1 TABLET BY MOUTH DAILY 90 tablet 3   citalopram (CELEXA) 10 MG tablet Take 1 tablet (10 mg total) by mouth daily. 90 tablet 3   diclofenac Sodium (VOLTAREN) 1 % GEL AAPLY 4 GRAMS TOPICALLY 4 TIMES DAILY (Patient taking differently: Apply 1 Application topically 4 (four) times daily as needed (pain).) 400 g 2   enzalutamide (XTANDI) 40 MG tablet Take 4 tablets (160 mg total) by mouth daily. 120 tablet 3   fluticasone (FLONASE) 50 MCG/ACT nasal spray PLACE 1 SPRAY INTO BOTH NOSTRILS DAILY AS NEEDED FOR ALLERGIES OR RHINITIS (AT BEDTIME). 48 mL 1   furosemide (LASIX) 20 MG tablet TAKE 1 TABLET BY MOUTH DAILY 90 tablet 3   magnesium hydroxide (  MILK OF MAGNESIA) 400 MG/5ML suspension Take 30 mLs by mouth daily as needed for mild constipation.     Melatonin 10 MG CAPS Take 20 mg by mouth at bedtime.     Multiple Vitamin (MULTIVITAMIN WITH MINERALS) TABS tablet Take 0.5 tablets by mouth daily.     omeprazole (PRILOSEC) 20 MG capsule TAKE 1 CAPSULE BY MOUTH DAILY BEFORE BREAKFAST. 90 capsule 3   Tiotropium Bromide  Monohydrate (SPIRIVA RESPIMAT) 2.5 MCG/ACT AERS Inhale 2 puffs into the lungs daily. 2 each 0   benzonatate (TESSALON) 100 MG capsule TAKE 1 CAPSULE BY MOUTH EVERY DAY AS NEEDED 30 capsule 1   metoprolol tartrate (LOPRESSOR) 25 MG tablet TAKE 1 TABLET BY MOUTH TWICE A DAY 180 tablet 3   No current facility-administered medications on file prior to visit.   Past Medical History:  Diagnosis Date   Anxiety    Arthritis    COPD (chronic obstructive pulmonary disease) ([place])    Coronary artery disease    Dyspnea    Gastric outlet obstruction 02/03/2018   [name] 02/03/2018   GERD (gastroesophageal reflux disease)    History of hiatal hernia    small/notes 02/03/2018   Hypertension    no meds   Presence of permanent cardiac pacemaker    Prostate cancer ([place]) 02/08/2014   Gleason 4+5=9, PSA 15.65   Radiation    Sinus problem    SSS (sick sinus syndrome) ([facility])    Allergies  Allergen Reactions   Penicillins Rash    Has patient had a PCN reaction causing immediate rash, facial/tongue/throat swelling, SOB or lightheadedness with hypotension: Yes Has patient had a PCN reaction causing severe rash involving mucus membranes or skin necrosis: No Has patient had a PCN reaction that required hospitalization: No Has patient had a PCN reaction occurring within the last 10 years: No If all of the above answers are "NO", then may proceed with Cephalosporin use.   Social History   Socioeconomic History   Marital status: Married    Spouse name: Not on file   Number of children: 3   Years of education: Not on file   Highest education level: Not on file  Occupational History   Occupation: Freight forwarder    Employer: [employer]    Comment: retired   Occupation: Freight forwarder    Comment: [employer]-retired  Tobacco Use   Smoking status: Former    Types: Cigars    Quit date: 09/14/2012    Years since quitting: 10.0   Smokeless tobacco: Never   Tobacco comments:    little cigars; the small ones"  Vaping  Use   Vaping Use: Never used  Substance and Sexual Activity   Alcohol use: Not Currently    Alcohol/week: 14.0 standard drinks of alcohol    Types: 14 Cans of beer per week   Drug use: No   Sexual activity: Not Currently  Other Topics Concern   Not on file  Social History Narrative   Lives with wife and 2 dogs in one level home; daughter and her family co-habitate.   Has three daughters, all in [place], supportive. Five grandchildren.   Wants to return to doing silver sneakers once gym re-opens.         Social Determinants of Health   Financial Resource Strain: Low Risk  (07/29/2022)   Overall Financial Resource Strain (CARDIA)    Difficulty of Paying Living Expenses: Not hard at all  Recent Concern: Financial Resource Strain - Medium Risk (05/27/2022)  Overall Financial Resource Strain (CARDIA)    Difficulty of Paying Living Expenses: Somewhat hard  Food Insecurity: No Food Insecurity (08/04/2022)   Hunger Vital Sign    Worried About Running Out of Food in the Last Year: Never true    Ran Out of Food in the Last Year: Never true  Transportation Needs: No Transportation Needs (08/04/2022)   PRAPARE - Hydrologist (Medical): No    Lack of Transportation (Non-Medical): No  Physical Activity: Insufficiently Active (07/29/2022)   Exercise Vital Sign    Days of Exercise per Week: 5 days    Minutes of Exercise per Session: 20 min  Stress: No Stress Concern Present (07/29/2022)   [place]    Feeling of Stress : Not at all  Social Connections: [place] (07/29/2022)   Social Connection and Isolation Panel [NHANES]    Frequency of Communication with Friends and Family: More than three times a week    Frequency of Social Gatherings with Friends and Family: More than three times a week    Attends Religious Services: More than 4 times per year    Active Member of [employer] or  Organizations: Yes    Attends Archivist Meetings: More than 4 times per year    Marital Status: Married   Vitals:   10/12/22 0927  BP: 128/80  Pulse: 86  Resp: 16  SpO2: 95%  Body mass index is 29.97 kg/m. Physical Exam Vitals and nursing note reviewed.  Constitutional:      General: He is not in acute distress.    Appearance: He is well-developed and well-groomed.  HENT:     Head: Normocephalic and atraumatic.     Mouth/Throat:     Mouth: Mucous membranes are moist.     Dentition: Has dentures.  Eyes:     Conjunctiva/sclera: Conjunctivae normal.  Cardiovascular:     Rate and Rhythm: Normal rate and regular rhythm.     Heart sounds: No murmur heard.    Comments: Trace pitting LE edema, bilateral. Pulmonary:     Effort: Pulmonary effort is normal. No respiratory distress.     Breath sounds: Normal breath sounds. No wheezing, rhonchi or rales.  Abdominal:     Palpations: Abdomen is soft. There is no mass.     Tenderness: There is no abdominal tenderness.     Hernia: A hernia (supraumbilical incisional hernia) is present.  Lymphadenopathy:     Cervical: No cervical adenopathy.  Skin:    General: Skin is warm.     Findings: No erythema or rash.  Neurological:     Mental Status: He is alert and oriented to person, place, and time.     Comments: Gait assisted by a cane.   ASSESSMENT AND PLAN:   TonyTagen was seen today for follow-up.  Diagnoses and all orders for this visit:  Essential hypertension, benign Assessment & Plan: BP adequately controlled. Following with cardiologist. Continue metoprolol titrate 25 mg twice daily. Recommend monitoring BP regularly.   Insomnia, unspecified type Assessment & Plan: Sleep hygiene is a problem due to urinary symptoms, persistent cough, and having to take his dog to use the bathroom a few times in the middle of the night. Continue lorazepam 1 mg 1/2 to 1 tablet at night as needed.   COPD mixed type  [facility]) Assessment & Plan: Problem is stable. Continue Spiriva Respimat 2.5 mcg, she will continue 2 puffs daily.  He is following with pulmonologist.    Chronic respiratory failure with hypoxia ([place]) Assessment & Plan: On BiPAP with 3 L of supplemental oxygen at night. Stressed the importance of compliance with BiPAP. Following with pulmonologist.   SSS (sick sinus syndrome) [facility]) Assessment & Plan: S/p pacemaker placement. Following with cardiologist.   Malignant neoplasm of prostate [facility]) Assessment & Plan: Dr. [name] is retiring, so he is planning on establishing with new oncologist. Currently he is on [place].   Anxiety disorder due to known physiological condition Assessment & Plan: Problem is stable. Continue Celexa 10 mg daily and Lorazepam 1 mg bid prn. PMP reviewed. We discussed some side effects of medications.   Chronic cough Assessment & Plan: Problem has been intermittent for over a year. Benzonatate 100 mg daily as needed helps. Following with pulmonologist.    Return in about 6 months (around 04/12/2023) for chronic problems.  Ahmon Tosi G. [name], MD  [facility]. [place] office.

## 2022-10-09 NOTE — Anesthesia Preprocedure Evaluation (Signed)
Anesthesia Evaluation  Patient identified by MRN, date of birth, ID band Patient awake    Reviewed: Allergy & Precautions, NPO status , Patient's Chart, lab work & pertinent test results, reviewed documented beta blocker date and time   Airway Mallampati: III  TM Distance: >3 FB Neck ROM: Full    Dental  (+) Dental Advisory Given, Lower Dentures, Upper Dentures   Pulmonary shortness of breath, sleep apnea, Continuous Positive Airway Pressure Ventilation and Oxygen sleep apnea , COPD,  oxygen dependent, former smoker   Pulmonary exam normal breath sounds clear to auscultation       Cardiovascular hypertension, Pt. on home beta blockers + CAD  Normal cardiovascular exam+ dysrhythmias (SSS) + pacemaker  Rhythm:Regular Rate:Normal  CV: Echo 07/14/2021  1. Left ventricular ejection fraction, by estimation, is 60 to 65%. The  left ventricle has normal function. The left ventricle has no regional  wall motion abnormalities. Left ventricular diastolic parameters were  normal.   2. Right ventricular systolic function is normal. The right ventricular  size is normal.   3. The mitral valve is normal in structure. No evidence of mitral valve  regurgitation. No evidence of mitral stenosis.   4. The aortic valve is calcified. Aortic valve regurgitation is trivial.  Mild aortic valve sclerosis is present, with no evidence of aortic valve  stenosis.   5. Aortic dilatation noted. There is mild dilatation of the aortic root,  measuring 40 mm. There is mild dilatation of the ascending aorta,  measuring 43 mm.   6. The inferior vena cava is normal in size with greater than 50%  respiratory variability, suggesting right atrial pressure of 3 mmHg.     Neuro/Psych  PSYCHIATRIC DISORDERS Anxiety     negative neurological ROS     GI/Hepatic Neg liver ROS, hiatal hernia,GERD  Medicated,,  Endo/Other  Obesity   Renal/GU negative Renal ROS    Prostate cancer     Musculoskeletal negative musculoskeletal ROS (+) Arthritis ,    Abdominal   Peds  Hematology negative hematology ROS (+)   Anesthesia Other Findings   Reproductive/Obstetrics                             Anesthesia Physical Anesthesia Plan  ASA: 4  Anesthesia Plan: General   Post-op Pain Management: Tylenol PO (pre-op)*   Induction: Intravenous  PONV Risk Score and Plan: 3 and Dexamethasone and Ondansetron  Airway Management Planned: Oral ETT  Additional Equipment: None  Intra-op Plan:   Post-operative Plan: Extubation in OR and Possible Post-op intubation/ventilation  Informed Consent: I have reviewed the patients History and Physical, chart, labs and discussed the procedure including the risks, benefits and alternatives for the proposed anesthesia with the patient or authorized representative who has indicated his/her understanding and acceptance.     Dental advisory given  Plan Discussed with: CRNA and Anesthesiologist  Anesthesia Plan Comments: (See PAT note 10/08/2022 DISCUSSION:81 y.o. former smoker with h/o HTN, GERD, COPD, CAD, SSS with pacemaker in place (device orders in 10/07/2022 progress note), incisional hernia scheduled for above procedure 10/14/2022 with Dr. Michaelle Birks.    Pt last seen by cardiology 09/23/2022. Per OV note, "Mr. Oyer's perioperative risk of a major cardiac event is 0.9% according to the Revised Cardiac Risk Index (RCRI).  Therefore, he is at low risk for perioperative complications.   His functional capacity is good at 5.07 METs according to the Duke Activity Status Index (DASI).  Recommendations: According to ACC/AHA guidelines, no further cardiovascular testing needed.  The patient may proceed to surgery at acceptable risk.     The patient was advised that if he develops new symptoms prior to surgery to contact our office to arrange for a follow-up visit, and he verbalized  understanding." )        Anesthesia Quick Evaluation

## 2022-10-09 NOTE — Telephone Encounter (Signed)
I s/w the pt's wife who had questions as to when to hold ASA. Wife states the surgeon office informed them to call cardiology for ASA recommendations. I stated to the pt's wife to have pt begin holding ASA as of today and will resume once felt surgeon feels it is safe. Pt's wife thanked me for the help. I will send FYI to requesting office.    I read notes 09/23/22 Tony [name], [facility] who did a tele visit with the pt, though the ASA wa not noted in her notes. I read further in the 09/16/22 pre op notes and gave the recommendations:   Okay to stop aspirin for 5 to 7 days if desired but not requested Tony [name], Tony Nestle, NP 09/16/2022, 10:44 AM [name]  Just FYI ASA was not listed on clearance form as needing to be held this is only a recommendation from cardiology as surgeon clearance form did not state they need to hold ASA.

## 2022-10-09 NOTE — Telephone Encounter (Signed)
Pt spouse called in asking about holding aspirin. She states she spoke to requesting office and they told her to contact us on if pt should hold his aspirin or not before surgery.

## 2022-10-11 ENCOUNTER — Other Ambulatory Visit: Payer: Self-pay | Admitting: Family Medicine

## 2022-10-11 ENCOUNTER — Other Ambulatory Visit: Payer: Self-pay | Admitting: Cardiovascular Disease

## 2022-10-11 DIAGNOSIS — R053 Chronic cough: Secondary | ICD-10-CM

## 2022-10-12 ENCOUNTER — Ambulatory Visit (INDEPENDENT_AMBULATORY_CARE_PROVIDER_SITE_OTHER): Payer: Medicare Other | Admitting: Family Medicine

## 2022-10-12 ENCOUNTER — Encounter: Payer: Self-pay | Admitting: Family Medicine

## 2022-10-12 VITALS — BP 128/80 | HR 86 | Resp 16 | Ht 67.0 in | Wt 191.4 lb

## 2022-10-12 DIAGNOSIS — J449 Chronic obstructive pulmonary disease, unspecified: Secondary | ICD-10-CM

## 2022-10-12 DIAGNOSIS — G4700 Insomnia, unspecified: Secondary | ICD-10-CM

## 2022-10-12 DIAGNOSIS — I10 Essential (primary) hypertension: Secondary | ICD-10-CM | POA: Diagnosis not present

## 2022-10-12 DIAGNOSIS — R053 Chronic cough: Secondary | ICD-10-CM | POA: Diagnosis not present

## 2022-10-12 DIAGNOSIS — F064 Anxiety disorder due to known physiological condition: Secondary | ICD-10-CM

## 2022-10-12 DIAGNOSIS — J9611 Chronic respiratory failure with hypoxia: Secondary | ICD-10-CM

## 2022-10-12 DIAGNOSIS — I495 Sick sinus syndrome: Secondary | ICD-10-CM | POA: Diagnosis not present

## 2022-10-12 DIAGNOSIS — C61 Malignant neoplasm of prostate: Secondary | ICD-10-CM

## 2022-10-12 NOTE — Assessment & Plan Note (Signed)
Dr. [name] is retiring, so he is planning on establishing with new oncologist. Currently he is on [place].

## 2022-10-12 NOTE — Assessment & Plan Note (Signed)
S/p pacemaker placement. Following with cardiologist.

## 2022-10-12 NOTE — Assessment & Plan Note (Signed)
BP adequately controlled. Following with cardiologist. Continue metoprolol titrate 25 mg twice daily. Recommend monitoring BP regularly.

## 2022-10-12 NOTE — Assessment & Plan Note (Signed)
Problem is stable. Continue Celexa 10 mg daily and Lorazepam 1 mg bid prn. PMP reviewed. We discussed some side effects of medications.

## 2022-10-12 NOTE — Assessment & Plan Note (Signed)
On BiPAP with 3 L of supplemental oxygen at night. Stressed the importance of compliance with BiPAP. Following with pulmonologist.

## 2022-10-12 NOTE — Progress Notes (Signed)
Remote pacemaker transmission.   

## 2022-10-12 NOTE — Assessment & Plan Note (Signed)
Problem has been intermittent for over a year. Benzonatate 100 mg daily as needed helps. Following with pulmonologist.

## 2022-10-12 NOTE — Assessment & Plan Note (Signed)
Problem is stable. Continue Spiriva Respimat 2.5 mcg, she will continue 2 puffs daily.   He is following with pulmonologist.

## 2022-10-12 NOTE — Assessment & Plan Note (Signed)
Sleep hygiene is a problem due to urinary symptoms, persistent cough, and having to take his dog to use the bathroom a few times in the middle of the night. Continue lorazepam 1 mg 1/2 to 1 tablet at night as needed.

## 2022-10-12 NOTE — Patient Instructions (Signed)
A few things to remember from today's visit:  Essential hypertension, benign  Insomnia, unspecified type  COPD mixed type ([place]), Chronic  Chronic respiratory failure with hypoxia ([place]), Chronic  SSS (sick sinus syndrome) ([place]), Chronic  Malignant neoplasm of prostate ([place]), Chronic  No changes today. Fall precautions.  If you need refills for medications you take chronically, please call your pharmacy. Do not use My Chart to request refills or for acute issues that need immediate attention. If you send a my chart message, it may take a few days to be addressed, specially if I am not in the office.  Please be sure medication list is accurate. If a new problem present, please set up appointment sooner than planned today.

## 2022-10-13 DIAGNOSIS — J9611 Chronic respiratory failure with hypoxia: Secondary | ICD-10-CM | POA: Diagnosis not present

## 2022-10-14 ENCOUNTER — Ambulatory Visit (HOSPITAL_COMMUNITY)
Admission: RE | Admit: 2022-10-14 | Discharge: 2022-10-15 | Disposition: A | Discharge status: Home / Self Care | Payer: Medicare Other | Source: Ambulatory Visit | Attending: Surgery | Admitting: Surgery

## 2022-10-14 ENCOUNTER — Encounter (HOSPITAL_COMMUNITY): Payer: Self-pay | Admitting: Surgery

## 2022-10-14 ENCOUNTER — Ambulatory Visit (HOSPITAL_COMMUNITY): Payer: Medicare Other | Admitting: Physician Assistant

## 2022-10-14 ENCOUNTER — Other Ambulatory Visit: Payer: Self-pay

## 2022-10-14 ENCOUNTER — Encounter (HOSPITAL_COMMUNITY)
Admission: RE | Disposition: A | Discharge status: Home / Self Care | Payer: Self-pay | Source: Ambulatory Visit | Attending: Surgery

## 2022-10-14 ENCOUNTER — Ambulatory Visit (HOSPITAL_BASED_OUTPATIENT_CLINIC_OR_DEPARTMENT_OTHER): Payer: Medicare Other | Admitting: Anesthesiology

## 2022-10-14 DIAGNOSIS — M1990 Unspecified osteoarthritis, unspecified site: Secondary | ICD-10-CM | POA: Diagnosis not present

## 2022-10-14 DIAGNOSIS — E669 Obesity, unspecified: Secondary | ICD-10-CM | POA: Insufficient documentation

## 2022-10-14 DIAGNOSIS — I10 Essential (primary) hypertension: Secondary | ICD-10-CM

## 2022-10-14 DIAGNOSIS — J449 Chronic obstructive pulmonary disease, unspecified: Secondary | ICD-10-CM | POA: Diagnosis not present

## 2022-10-14 DIAGNOSIS — I2510 Atherosclerotic heart disease of native coronary artery without angina pectoris: Secondary | ICD-10-CM | POA: Insufficient documentation

## 2022-10-14 DIAGNOSIS — Z7952 Long term (current) use of systemic steroids: Secondary | ICD-10-CM | POA: Diagnosis not present

## 2022-10-14 DIAGNOSIS — K432 Incisional hernia without obstruction or gangrene: Secondary | ICD-10-CM | POA: Diagnosis not present

## 2022-10-14 DIAGNOSIS — Z87891 Personal history of nicotine dependence: Secondary | ICD-10-CM | POA: Diagnosis not present

## 2022-10-14 DIAGNOSIS — Z6829 Body mass index (BMI) 29.0-29.9, adult: Secondary | ICD-10-CM | POA: Diagnosis not present

## 2022-10-14 DIAGNOSIS — Z950 Presence of cardiac pacemaker: Secondary | ICD-10-CM | POA: Diagnosis not present

## 2022-10-14 DIAGNOSIS — Z79899 Other long term (current) drug therapy: Secondary | ICD-10-CM | POA: Insufficient documentation

## 2022-10-14 DIAGNOSIS — F419 Anxiety disorder, unspecified: Secondary | ICD-10-CM | POA: Diagnosis not present

## 2022-10-14 DIAGNOSIS — Z8546 Personal history of malignant neoplasm of prostate: Secondary | ICD-10-CM | POA: Insufficient documentation

## 2022-10-14 DIAGNOSIS — Z9981 Dependence on supplemental oxygen: Secondary | ICD-10-CM | POA: Insufficient documentation

## 2022-10-14 DIAGNOSIS — Z791 Long term (current) use of non-steroidal anti-inflammatories (NSAID): Secondary | ICD-10-CM | POA: Insufficient documentation

## 2022-10-14 DIAGNOSIS — I700 Atherosclerosis of aorta: Secondary | ICD-10-CM | POA: Insufficient documentation

## 2022-10-14 DIAGNOSIS — G4730 Sleep apnea, unspecified: Secondary | ICD-10-CM | POA: Diagnosis not present

## 2022-10-14 DIAGNOSIS — I495 Sick sinus syndrome: Secondary | ICD-10-CM | POA: Diagnosis not present

## 2022-10-14 DIAGNOSIS — Z923 Personal history of irradiation: Secondary | ICD-10-CM | POA: Diagnosis not present

## 2022-10-14 DIAGNOSIS — K219 Gastro-esophageal reflux disease without esophagitis: Secondary | ICD-10-CM | POA: Insufficient documentation

## 2022-10-14 DIAGNOSIS — E785 Hyperlipidemia, unspecified: Secondary | ICD-10-CM | POA: Insufficient documentation

## 2022-10-14 DIAGNOSIS — Z9049 Acquired absence of other specified parts of digestive tract: Secondary | ICD-10-CM | POA: Insufficient documentation

## 2022-10-14 HISTORY — PX: INCISIONAL HERNIA REPAIR: SHX193

## 2022-10-14 SURGERY — REPAIR, HERNIA, INCISIONAL
Anesthesia: General | Site: Abdomen

## 2022-10-14 MED ORDER — DEXAMETHASONE SODIUM PHOSPHATE 10 MG/ML IJ SOLN
INTRAMUSCULAR | Status: DC | PRN
  Administered 2022-10-14: 10 mg via INTRAVENOUS

## 2022-10-14 MED ORDER — PHENYLEPHRINE 80 MCG/ML (10ML) SYRINGE FOR IV PUSH (FOR BLOOD PRESSURE SUPPORT)
PREFILLED_SYRINGE | INTRAVENOUS | Status: DC | PRN
  Administered 2022-10-14: 160 ug via INTRAVENOUS

## 2022-10-14 MED ORDER — UMECLIDINIUM BROMIDE 62.5 MCG/ACT IN AEPB
1.0000 | INHALATION_SPRAY | Freq: Every day | RESPIRATORY_TRACT | Status: DC
  Administered 2022-10-15: 1 via RESPIRATORY_TRACT
  Filled 2022-10-14: qty 7

## 2022-10-14 MED ORDER — PROPOFOL 10 MG/ML IV BOLUS
INTRAVENOUS | Status: DC | PRN
  Administered 2022-10-14: 130 mg via INTRAVENOUS

## 2022-10-14 MED ORDER — MEPERIDINE HCL 50 MG/ML IJ SOLN: 6.2500 mg | INTRAMUSCULAR | Status: DC | PRN

## 2022-10-14 MED ORDER — ALBUTEROL SULFATE (2.5 MG/3ML) 0.083% IN NEBU: 2.5000 mg | INHALATION_SOLUTION | RESPIRATORY_TRACT | Status: DC | PRN

## 2022-10-14 MED ORDER — EPINEPHRINE PF 1 MG/ML IJ SOLN
INTRAMUSCULAR | Status: AC
  Filled 2022-10-14: qty 1

## 2022-10-14 MED ORDER — 0.9 % SODIUM CHLORIDE (POUR BTL) OPTIME
TOPICAL | Status: DC | PRN
  Administered 2022-10-14: 450 mL

## 2022-10-14 MED ORDER — ENOXAPARIN SODIUM 40 MG/0.4ML IJ SOSY
40.0000 mg | PREFILLED_SYRINGE | INTRAMUSCULAR | Status: DC
  Administered 2022-10-15: 40 mg via SUBCUTANEOUS
  Filled 2022-10-14: qty 0.4

## 2022-10-14 MED ORDER — ROCURONIUM BROMIDE 10 MG/ML (PF) SYRINGE
PREFILLED_SYRINGE | INTRAVENOUS | Status: AC
  Filled 2022-10-14: qty 10

## 2022-10-14 MED ORDER — OXYCODONE HCL 5 MG PO TABS: 5.0000 mg | ORAL_TABLET | Freq: Once | ORAL | Status: DC | PRN

## 2022-10-14 MED ORDER — FENTANYL CITRATE (PF) 250 MCG/5ML IJ SOLN
INTRAMUSCULAR | Status: AC
  Filled 2022-10-14: qty 5

## 2022-10-14 MED ORDER — PROPOFOL 10 MG/ML IV BOLUS
INTRAVENOUS | Status: AC
  Filled 2022-10-14: qty 20

## 2022-10-14 MED ORDER — ONDANSETRON HCL 4 MG/2ML IJ SOLN
INTRAMUSCULAR | Status: AC
  Filled 2022-10-14: qty 2

## 2022-10-14 MED ORDER — FENTANYL CITRATE PF 50 MCG/ML IJ SOSY
PREFILLED_SYRINGE | INTRAMUSCULAR | Status: AC
  Filled 2022-10-14: qty 2

## 2022-10-14 MED ORDER — LIDOCAINE 2% (20 MG/ML) 5 ML SYRINGE
INTRAMUSCULAR | Status: DC | PRN
  Administered 2022-10-14: 100 mg via INTRAVENOUS

## 2022-10-14 MED ORDER — LACTATED RINGERS IV SOLN: INTRAVENOUS | Status: DC

## 2022-10-14 MED ORDER — TRAMADOL HCL 50 MG PO TABS
ORAL_TABLET | ORAL | Status: AC
  Filled 2022-10-14: qty 1

## 2022-10-14 MED ORDER — ACETAMINOPHEN 500 MG PO TABS
1000.0000 mg | ORAL_TABLET | Freq: Three times a day (TID) | ORAL | Status: DC
  Administered 2022-10-14 – 2022-10-15 (×3): 1000 mg via ORAL
  Filled 2022-10-14 (×3): qty 2

## 2022-10-14 MED ORDER — CEFAZOLIN SODIUM-DEXTROSE 2-4 GM/100ML-% IV SOLN
2.0000 g | INTRAVENOUS | Status: AC
  Administered 2022-10-14: 2 g via INTRAVENOUS
  Filled 2022-10-14: qty 100

## 2022-10-14 MED ORDER — BUPIVACAINE-EPINEPHRINE 0.25% -1:200000 IJ SOLN
INTRAMUSCULAR | Status: DC | PRN
  Administered 2022-10-14: 20 mL

## 2022-10-14 MED ORDER — MAGNESIUM HYDROXIDE 400 MG/5ML PO SUSP: 30.0000 mL | Freq: Every day | ORAL | Status: DC | PRN

## 2022-10-14 MED ORDER — ASPIRIN 81 MG PO TBEC
81.0000 mg | DELAYED_RELEASE_TABLET | Freq: Every day | ORAL | Status: DC
  Administered 2022-10-15: 81 mg via ORAL
  Filled 2022-10-14: qty 1

## 2022-10-14 MED ORDER — ONDANSETRON HCL 4 MG/2ML IJ SOLN: 4.0000 mg | Freq: Once | INTRAMUSCULAR | Status: DC | PRN

## 2022-10-14 MED ORDER — ROCURONIUM BROMIDE 10 MG/ML (PF) SYRINGE
PREFILLED_SYRINGE | INTRAVENOUS | Status: DC | PRN
  Administered 2022-10-14: 60 mg via INTRAVENOUS

## 2022-10-14 MED ORDER — CHLORHEXIDINE GLUCONATE 0.12 % MT SOLN
15.0000 mL | Freq: Once | OROMUCOSAL | Status: AC
  Administered 2022-10-14: 15 mL via OROMUCOSAL

## 2022-10-14 MED ORDER — OXYCODONE HCL 5 MG/5ML PO SOLN: 5.0000 mg | Freq: Once | ORAL | Status: DC | PRN

## 2022-10-14 MED ORDER — LIDOCAINE HCL (PF) 2 % IJ SOLN
INTRAMUSCULAR | Status: AC
  Filled 2022-10-14: qty 5

## 2022-10-14 MED ORDER — MORPHINE SULFATE (PF) 2 MG/ML IV SOLN
2.0000 mg | INTRAVENOUS | Status: DC | PRN
  Administered 2022-10-14 – 2022-10-15 (×3): 2 mg via INTRAVENOUS
  Filled 2022-10-14 (×5): qty 1

## 2022-10-14 MED ORDER — ATORVASTATIN CALCIUM 20 MG PO TABS
20.0000 mg | ORAL_TABLET | Freq: Every day | ORAL | Status: DC
  Administered 2022-10-15: 20 mg via ORAL
  Filled 2022-10-14: qty 1

## 2022-10-14 MED ORDER — METOPROLOL TARTRATE 25 MG PO TABS
25.0000 mg | ORAL_TABLET | Freq: Two times a day (BID) | ORAL | Status: DC
  Administered 2022-10-14 – 2022-10-15 (×2): 25 mg via ORAL
  Filled 2022-10-14 (×2): qty 1

## 2022-10-14 MED ORDER — PANTOPRAZOLE SODIUM 40 MG PO TBEC
40.0000 mg | DELAYED_RELEASE_TABLET | Freq: Every day | ORAL | Status: DC
  Administered 2022-10-15: 40 mg via ORAL
  Filled 2022-10-14: qty 1

## 2022-10-14 MED ORDER — DOCUSATE SODIUM 100 MG PO CAPS
100.0000 mg | ORAL_CAPSULE | Freq: Two times a day (BID) | ORAL | Status: DC
  Administered 2022-10-14 – 2022-10-15 (×2): 100 mg via ORAL
  Filled 2022-10-14 (×2): qty 1

## 2022-10-14 MED ORDER — ONDANSETRON 4 MG PO TBDP: 4.0000 mg | ORAL_TABLET | Freq: Four times a day (QID) | ORAL | Status: DC | PRN

## 2022-10-14 MED ORDER — ONDANSETRON HCL 4 MG/2ML IJ SOLN: 4.0000 mg | Freq: Four times a day (QID) | INTRAMUSCULAR | Status: DC | PRN

## 2022-10-14 MED ORDER — DEXAMETHASONE SODIUM PHOSPHATE 10 MG/ML IJ SOLN
INTRAMUSCULAR | Status: AC
  Filled 2022-10-14: qty 1

## 2022-10-14 MED ORDER — TIOTROPIUM BROMIDE MONOHYDRATE 2.5 MCG/ACT IN AERS: 2.0000 | INHALATION_SPRAY | Freq: Every day | RESPIRATORY_TRACT | Status: DC

## 2022-10-14 MED ORDER — ONDANSETRON HCL 4 MG/2ML IJ SOLN
INTRAMUSCULAR | Status: DC | PRN
  Administered 2022-10-14: 4 mg via INTRAVENOUS

## 2022-10-14 MED ORDER — ACETAMINOPHEN 325 MG PO TABS: 325.0000 mg | ORAL_TABLET | ORAL | Status: DC | PRN

## 2022-10-14 MED ORDER — CITALOPRAM HYDROBROMIDE 10 MG PO TABS
10.0000 mg | ORAL_TABLET | Freq: Every day | ORAL | Status: DC
  Administered 2022-10-15: 10 mg via ORAL
  Filled 2022-10-14: qty 1

## 2022-10-14 MED ORDER — FENTANYL CITRATE PF 50 MCG/ML IJ SOSY
25.0000 ug | PREFILLED_SYRINGE | INTRAMUSCULAR | Status: DC | PRN
  Administered 2022-10-14: 50 ug via INTRAVENOUS

## 2022-10-14 MED ORDER — ACETAMINOPHEN 160 MG/5ML PO SOLN: 325.0000 mg | ORAL | Status: DC | PRN

## 2022-10-14 MED ORDER — ACETAMINOPHEN 500 MG PO TABS
1000.0000 mg | ORAL_TABLET | ORAL | Status: AC
  Administered 2022-10-14: 1000 mg via ORAL
  Filled 2022-10-14: qty 2

## 2022-10-14 MED ORDER — BUPIVACAINE HCL (PF) 0.25 % IJ SOLN
INTRAMUSCULAR | Status: AC
  Filled 2022-10-14: qty 30

## 2022-10-14 MED ORDER — FUROSEMIDE 20 MG PO TABS
20.0000 mg | ORAL_TABLET | Freq: Every day | ORAL | Status: DC
  Administered 2022-10-15: 20 mg via ORAL
  Filled 2022-10-14: qty 1

## 2022-10-14 MED ORDER — PHENYLEPHRINE 80 MCG/ML (10ML) SYRINGE FOR IV PUSH (FOR BLOOD PRESSURE SUPPORT)
PREFILLED_SYRINGE | INTRAVENOUS | Status: AC
  Filled 2022-10-14: qty 10

## 2022-10-14 MED ORDER — ORAL CARE MOUTH RINSE: 15.0000 mL | Freq: Once | OROMUCOSAL | Status: AC

## 2022-10-14 MED ORDER — TRAMADOL HCL 50 MG PO TABS
50.0000 mg | ORAL_TABLET | Freq: Four times a day (QID) | ORAL | Status: DC | PRN
  Administered 2022-10-14 – 2022-10-15 (×2): 50 mg via ORAL
  Filled 2022-10-14 (×2): qty 1

## 2022-10-14 MED ORDER — SUGAMMADEX SODIUM 200 MG/2ML IV SOLN
INTRAVENOUS | Status: DC | PRN
  Administered 2022-10-14: 200 mg via INTRAVENOUS

## 2022-10-14 MED ORDER — FENTANYL CITRATE (PF) 250 MCG/5ML IJ SOLN
INTRAMUSCULAR | Status: DC | PRN
  Administered 2022-10-14 (×2): 50 ug via INTRAVENOUS
  Administered 2022-10-14: 25 ug via INTRAVENOUS
  Administered 2022-10-14: 50 ug via INTRAVENOUS
  Administered 2022-10-14: 25 ug via INTRAVENOUS

## 2022-10-14 SURGICAL SUPPLY — 36 items
BAG COUNTER SPONGE SURGICOUNT (BAG) IMPLANT
BINDER ABDOMINAL 12 ML 46-62 (SOFTGOODS) ×1 IMPLANT
CHLORAPREP W/TINT 26 (MISCELLANEOUS) ×1 IMPLANT
COVER SURGICAL LIGHT HANDLE (MISCELLANEOUS) ×1 IMPLANT
DERMABOND ADVANCED .7 DNX12 (GAUZE/BANDAGES/DRESSINGS) IMPLANT
DRAIN CHANNEL 19F RND (DRAIN) IMPLANT
DRAPE LAPAROSCOPIC ABDOMINAL (DRAPES) ×1 IMPLANT
ELECT REM PT RETURN 15FT ADLT (MISCELLANEOUS) ×1 IMPLANT
EVACUATOR SILICONE 100CC (DRAIN) IMPLANT
GAUZE SPONGE 4X4 12PLY STRL (GAUZE/BANDAGES/DRESSINGS) IMPLANT
GLOVE BIO SURGEON STRL SZ 6 (GLOVE) ×1 IMPLANT
GLOVE BIOGEL PI IND STRL 6 (GLOVE) IMPLANT
GLOVE BIOGEL PI MICRO STRL 5.5 (GLOVE) ×1 IMPLANT
GLOVE INDICATOR 6.5 STRL GRN (GLOVE) ×1 IMPLANT
GLOVE SURG POLYISO LF SZ6 (GLOVE) ×1 IMPLANT
GOWN STRL REUS W/ TWL LRG LVL3 (GOWN DISPOSABLE) ×1 IMPLANT
GOWN STRL REUS W/TWL LRG LVL3 (GOWN DISPOSABLE) ×2
KIT BASIN OR (CUSTOM PROCEDURE TRAY) ×1 IMPLANT
KIT TURNOVER KIT A (KITS) IMPLANT
MESH VENTRALEX ST 8CM LRG (Mesh General) IMPLANT
NEEDLE HYPO 22GX1.5 SAFETY (NEEDLE) IMPLANT
PACK GENERAL/GYN (CUSTOM PROCEDURE TRAY) ×1 IMPLANT
SPIKE FLUID TRANSFER (MISCELLANEOUS) ×1 IMPLANT
SPONGE DRAIN TRACH 4X4 STRL 2S (GAUZE/BANDAGES/DRESSINGS) IMPLANT
SUT ETHIBOND 0 MO6 C/R (SUTURE) IMPLANT
SUT ETHILON 2 0 PS N (SUTURE) IMPLANT
SUT MNCRL AB 4-0 PS2 18 (SUTURE) ×1 IMPLANT
SUT NOVA 1 T20/GS 25DT (SUTURE) IMPLANT
SUT PDS AB 1 CT1 27 (SUTURE) IMPLANT
SUT PROLENE 2 0 CT2 30 (SUTURE) IMPLANT
SUT VIC AB 3-0 SH 27 (SUTURE)
SUT VIC AB 3-0 SH 27XBRD (SUTURE) IMPLANT
SUT VIC AB 3-0 SH 8-18 (SUTURE) IMPLANT
SYR CONTROL 10ML LL (SYRINGE) IMPLANT
TOWEL OR 17X26 10 PK STRL BLUE (TOWEL DISPOSABLE) ×1 IMPLANT
TOWEL OR NON WOVEN STRL DISP B (DISPOSABLE) ×1 IMPLANT

## 2022-10-14 NOTE — Interval H&P Note (Signed)
History and Physical Interval Note:  10/14/2022 7:27 AM  Tony [name]  has presented today for surgery, with the diagnosis of INCISIONAL HERNIA.  The various methods of treatment have been discussed with the patient and family. After consideration of risks, benefits and other options for treatment, the patient has consented to  Procedure(s): [place] (N/A) as a surgical intervention.  The patient's history has been reviewed, patient examined, no change in status, stable for surgery.  I have reviewed the patient's chart and labs.  Questions were answered to the patient's satisfaction.  Stratified as low risk for periop cardiac complications by cardiologist. Plan for overnight observation given medical comorbidities.   Tony [name]

## 2022-10-14 NOTE — Transfer of Care (Signed)
Immediate Anesthesia Transfer of Care Note  Patient: Tony [name]  Procedure(s) Performed: INCISIONAL HERNIA REPAIR WITH MESH (Abdomen)  Patient Location: PACU  Anesthesia Type:General  Level of Consciousness: awake, alert , and oriented  Airway & Oxygen Therapy: Patient Spontanous Breathing and Patient connected to face mask oxygen  Post-op Assessment: Report given to RN and Post -op Vital signs reviewed and stable  Post vital signs: Reviewed and stable  Last Vitals:  Vitals Value Taken Time  BP 155/89 10/14/22 0935  Temp    Pulse 59 10/14/22 0937  Resp 19 10/14/22 0937  SpO2 99 % 10/14/22 0937  Vitals shown include unvalidated device data.  Last Pain:  Vitals:   10/14/22 0702  TempSrc: Oral         Complications: No notable events documented.

## 2022-10-14 NOTE — Op Note (Signed)
Date: 10/14/22  Patient: Tony [name] MRN: [id number]  Preoperative Diagnosis: Incisional supraumbilical hernia Postoperative Diagnosis: Same  Procedure: Open repair of incisional hernia with mesh underlay  Surgeon: Michaelle Birks, MD  EBL: Minimal  Anesthesia: General endotracheal  Specimens: None  Indications: [name] is an 82 yo male who underwent a laparoscopic cholecystectomy in September 2023 for multiple episodes of cholangitis, presumably secondary to choledocholithiasis. He has developed an incisional hernia at his umbilical port site, containing a loop of small bowel without obstruction. It is symptomatic and after a discussion of the risks and benefits of surgery, he elected to proceed with repair.  Findings: 3cm hernia defect containing a loop of viable, nonobstructed small bowel. Repaired with an 8cm circular Ventralex mesh.  Procedure details: Informed consent was obtained in the preoperative area prior to the procedure. The patient was brought to the operating room and placed on the table in the supine position. General anesthesia was induced and appropriate lines and drains were placed for intraoperative monitoring. Perioperative antibiotics were administered per SCIP guidelines. The abdomen was prepped and draped in the usual sterile fashion. A pre-procedure timeout was taken verifying patient identity, surgical site and procedure to be performed.  A supraumbilical skin incision was made overlying the hernia. A hernia sac was encountered deep to the dermis and sharply opened. There was small bowel immediately visible within the hernia sac. The skin incision and hernia sac were further opened with cautery, protecting the underlying small bowel. The bowel appeared pink, viable and non-dilated. The bowel was reduced back into the abdomen. The resulting fascial defect was measured at 3cm. The fascial edges were palpated within the peritoneum to ensure there were no  adhesions to the fascia. The hernia sac was circumferentially excised from the subcutaneous tissues and discarded. Subcutaneous skin flaps were raised over the fascia circumferentially. A circular piece of 8cm Ventralex mesh was then brought onto the field. The mesh was placed intraperitoneal and tacked to the fascia using 1 Novafil transfascial sutures at each of the 4 quadrants. The sutures were then tied down to pull the mesh flat against the abdominal wall, ensuring there was no bowel between the mesh and the abdominal wall. The fascia was then closed over the mesh using interrupted 1 Novafil sutures, incorporating the tabs of the mesh into the sutures to keep the center of the mesh against the abdominal wall. The tabs of the mesh were then cut. The wound was irrigated and hemostasis was achieved with cautery. Scarpa's layer was closed with interrupted 3-0 Vicryl sutures, and the deep dermis was closed with a running 3-0 Vicryl suture. The skin was closed with a running subcuticular 4-0 monocryl suture and dermabond was applied.  The patient tolerated the procedure well with no apparent complications. All counts were correct x2 at the end of the procedure. The patient was extubated and taken to PACU in stable condition.  Michaelle Birks, MD 10/14/22 9:38 AM

## 2022-10-14 NOTE — Progress Notes (Signed)
Asked PT about nocturnal bipap Pt states he does use it at home, but refused our machine, states he is fine on just 02 for the time being.

## 2022-10-14 NOTE — Discharge Instructions (Addendum)
[facility] DISCHARGE INSTRUCTIONS  Activity No heavy lifting greater than 15 pounds for 8 weeks after surgery. Ok to shower, but do not bathe or submerge incisions underwater. Do not drive while taking narcotic pain medication.  Wound Care Your incision is covered with skin glue called Dermabond. This will peel off on its own over time. You may shower and allow warm soapy water to run over your incisions. Gently pat dry. Do not submerge your incision underwater. Monitor your incision for any new redness, tenderness, or drainage.  When to Call us: Fever greater than 100.5 New redness, drainage, or swelling at incision site Severe pain, nausea, or vomiting  Follow-up You have an appointment scheduled with Dr. Zenia Resides on November 02, 2022 at 9:20am. This will be at the [facility] Surgery office at [address], [place], [place], [place]. Please arrive at least 15 minutes prior to your scheduled appointment time.  For questions or concerns, please call the office at [phone].

## 2022-10-14 NOTE — Anesthesia Postprocedure Evaluation (Signed)
Anesthesia Post Note  Patient: Tony [name]  Procedure(s) Performed: INCISIONAL HERNIA REPAIR WITH MESH (Abdomen)     Patient location during evaluation: PACU Anesthesia Type: General Level of consciousness: awake and alert Pain management: pain level controlled Vital Signs Assessment: post-procedure vital signs reviewed and stable Respiratory status: spontaneous breathing, nonlabored ventilation, respiratory function stable and patient connected to nasal cannula oxygen Cardiovascular status: blood pressure returned to baseline and stable Postop Assessment: no apparent nausea or vomiting Anesthetic complications: no   No notable events documented.  Last Vitals:  Vitals:   10/14/22 1230 10/14/22 1305  BP: (!) 137/101 (!) 148/72  Pulse: 74 62  Resp: 14 16  Temp:  36.6 C  SpO2: 97% 95%    Last Pain:  Vitals:   10/14/22 1313  TempSrc:   PainSc: 10-Worst pain ever                 Vijay Durflinger

## 2022-10-14 NOTE — Anesthesia Procedure Notes (Signed)
Procedure Name: Intubation Date/Time: 10/14/2022 8:27 AM  Performed by: Sharlette Dense, CRNAPre-anesthesia Checklist: Patient identified, Emergency Drugs available, Suction available and Patient being monitored Patient Re-evaluated:Patient Re-evaluated prior to induction Oxygen Delivery Method: Circle system utilized Preoxygenation: Pre-oxygenation with 100% oxygen Induction Type: IV induction Ventilation: Mask ventilation without difficulty Laryngoscope Size: Miller and 3 Grade View: Grade I Tube type: Oral Tube size: 8.0 mm Number of attempts: 1 Airway Equipment and Method: Stylet Placement Confirmation: ETT inserted through vocal cords under direct vision, positive ETCO2 and breath sounds checked- equal and bilateral Secured at: 21 cm Tube secured with: Tape Dental Injury: Teeth and Oropharynx as per pre-operative assessment

## 2022-10-15 ENCOUNTER — Encounter (HOSPITAL_COMMUNITY): Payer: Self-pay | Admitting: Surgery

## 2022-10-15 DIAGNOSIS — I2510 Atherosclerotic heart disease of native coronary artery without angina pectoris: Secondary | ICD-10-CM | POA: Diagnosis not present

## 2022-10-15 DIAGNOSIS — Z9981 Dependence on supplemental oxygen: Secondary | ICD-10-CM | POA: Diagnosis not present

## 2022-10-15 DIAGNOSIS — Z9049 Acquired absence of other specified parts of digestive tract: Secondary | ICD-10-CM | POA: Diagnosis not present

## 2022-10-15 DIAGNOSIS — Z79899 Other long term (current) drug therapy: Secondary | ICD-10-CM | POA: Diagnosis not present

## 2022-10-15 DIAGNOSIS — Z87891 Personal history of nicotine dependence: Secondary | ICD-10-CM | POA: Diagnosis not present

## 2022-10-15 DIAGNOSIS — Z950 Presence of cardiac pacemaker: Secondary | ICD-10-CM | POA: Diagnosis not present

## 2022-10-15 DIAGNOSIS — Z7952 Long term (current) use of systemic steroids: Secondary | ICD-10-CM | POA: Diagnosis not present

## 2022-10-15 DIAGNOSIS — G4730 Sleep apnea, unspecified: Secondary | ICD-10-CM | POA: Diagnosis not present

## 2022-10-15 DIAGNOSIS — E785 Hyperlipidemia, unspecified: Secondary | ICD-10-CM | POA: Diagnosis not present

## 2022-10-15 DIAGNOSIS — I10 Essential (primary) hypertension: Secondary | ICD-10-CM | POA: Diagnosis not present

## 2022-10-15 DIAGNOSIS — J449 Chronic obstructive pulmonary disease, unspecified: Secondary | ICD-10-CM | POA: Diagnosis not present

## 2022-10-15 DIAGNOSIS — M1990 Unspecified osteoarthritis, unspecified site: Secondary | ICD-10-CM | POA: Diagnosis not present

## 2022-10-15 DIAGNOSIS — K219 Gastro-esophageal reflux disease without esophagitis: Secondary | ICD-10-CM | POA: Diagnosis not present

## 2022-10-15 DIAGNOSIS — I495 Sick sinus syndrome: Secondary | ICD-10-CM | POA: Diagnosis not present

## 2022-10-15 DIAGNOSIS — I700 Atherosclerosis of aorta: Secondary | ICD-10-CM | POA: Diagnosis not present

## 2022-10-15 DIAGNOSIS — K432 Incisional hernia without obstruction or gangrene: Secondary | ICD-10-CM | POA: Diagnosis not present

## 2022-10-15 DIAGNOSIS — Z923 Personal history of irradiation: Secondary | ICD-10-CM | POA: Diagnosis not present

## 2022-10-15 DIAGNOSIS — Z791 Long term (current) use of non-steroidal anti-inflammatories (NSAID): Secondary | ICD-10-CM | POA: Diagnosis not present

## 2022-10-15 MED ORDER — ACETAMINOPHEN 500 MG PO TABS: 1000.0000 mg | ORAL_TABLET | Freq: Three times a day (TID) | ORAL | 0 refills | Status: AC

## 2022-10-15 MED ORDER — TRAMADOL HCL 50 MG PO TABS: 50.0000 mg | ORAL_TABLET | Freq: Four times a day (QID) | ORAL | 0 refills | Status: AC | PRN

## 2022-10-15 MED ORDER — DOCUSATE SODIUM 100 MG PO CAPS: 100.0000 mg | ORAL_CAPSULE | Freq: Two times a day (BID) | ORAL | 0 refills | Status: DC

## 2022-10-15 NOTE — TOC Initial Note (Signed)
Transition of Care [facility]) - Initial/Assessment Note    Patient Details  Name: Tony [name] MRN: [id number] Date of Birth: 30-Jan-1941  Transition of Care [facility]) CM/SW Contact:    Leeroy Cha, RN Phone Number: 10/15/2022, 7:47 AM  Clinical Narrative:                   Transition of Care [facility]) Screening Note   Patient Details  Name: Tony [name] Date of Birth: 02-Feb-1941   Transition of Care [facility]) CM/SW Contact:    Leeroy Cha, RN Phone Number: 10/15/2022, 7:47 AM    Transition of Care Department [facility]) has reviewed patient and no TOC needs have been identified at this time. We will continue to monitor patient advancement through interdisciplinary progression rounds. If new patient transition needs arise, please place a TOC consult.   Expected Discharge Plan: Home/Self Care Barriers to Discharge: Continued Medical Work up   Patient Goals and CMS Choice Patient states their goals for this hospitalization and ongoing recovery are:: to go home and heal CMS Medicare.gov Compare Post Acute Care list provided to:: Patient        Expected Discharge Plan and Services   Discharge Planning Services: CM Consult   Living arrangements for the past 2 months: Single Family Home Expected Discharge Date: 10/15/22                                    Prior Living Arrangements/Services Living arrangements for the past 2 months: Single Family Home Lives with:: Spouse Patient language and need for interpreter reviewed:: Yes Do you feel safe going back to the place where you live?: Yes            Criminal Activity/Legal Involvement Pertinent to Current Situation/Hospitalization: No - Comment as needed  Activities of Daily Living Home Assistive Devices/Equipment: Cane (specify quad or straight), BIPAP ADL Screening (condition at time of admission) Patient's cognitive ability adequate to safely complete daily activities?: Yes Is the patient deaf or  have difficulty hearing?: No Does the patient have difficulty seeing, even when wearing glasses/contacts?: No Does the patient have difficulty concentrating, remembering, or making decisions?: No Patient able to express need for assistance with ADLs?: Yes Does the patient have difficulty dressing or bathing?: No Independently performs ADLs?: Yes (appropriate for developmental age) Does the patient have difficulty walking or climbing stairs?: Yes Weakness of Legs: None Weakness of Arms/Hands: None  Permission Sought/Granted                  Emotional Assessment Appearance:: Appears stated age Attitude/Demeanor/Rapport: Engaged Affect (typically observed): Calm Orientation: : Oriented to Self, Oriented to Place, Oriented to  Time, Oriented to Situation Alcohol / Substance Use: Tobacco Use (former smoker has quit) Psych Involvement: No (comment)  Admission diagnosis:  Incisional hernia [K43.2] Patient Active Problem List   Diagnosis Date Noted   Incisional hernia, without obstruction or gangrene 10/14/2022   Incisional hernia 10/14/2022   Chronic cough 10/12/2022   Symptomatic cholelithiasis 05/15/2022   Postoperative hypotension 05/15/2022   Preoperative clearance 02/16/2022   Morbid obesity ([place]) BMI 35.2 and HTN,OSA,HLD,COPD,GERD 01/14/2022   Abdominal aortic aneurysm (AAA) 3.0 cm to 5.5 cm in diameter in male ([place]) 12/26/2021   COVID 12/24/2021   Severe sepsis ([place])    Transaminitis    Chronic respiratory failure with hypoxia ([place]) 12/16/2021   Dysphagia 08/18/2021   Dyspnea 07/13/2021  Atherosclerosis of aorta ([place]) 06/20/2021   OSA (obstructive sleep apnea) 07/25/2020   Senile ecchymosis 05/21/2020   Obesity with body mass index (BMI) of 30.0 to 39.9 11/14/2019   Prediabetes 11/14/2019   Varicose veins of both lower extremities 05/17/2019   Bilateral lower extremity edema 03/13/2019   Hypokalemia 03/13/2019   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/03/2018   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   Acute appendicitis 01/21/2018   Perforated appendicitis 01/21/2018   Chronic fatigue 06/01/2017   Essential hypertension, benign 07/09/2016   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[place]) 07/06/2016   Coronary artery calcification seen on CAT scan 05/15/2016   Anxiety disorder 04/08/2016   Insomnia 04/08/2016   Atherosclerosis of coronary artery without angina pectoris 04/08/2016   Tobacco use disorder 04/08/2016   Hypercholesterolemia 04/08/2016   Malignant neoplasm of prostate ([place]) 02/23/2014   PCP:  [name], [name], MD Pharmacy:   [id number] [place], [place]Phone: [phone]: [address]Phone: [phone]: [phone] [place] [address]Phone: [phone]: [address] [address] [address]Phone: [phone]: [phone] [address]Phone: [phone]: [phone] [address] [place]Phone: [phone]: [phone] [place] [address] [place] [address]Phone: [phone]:  [phone]    Social Determinants of Health ([place] Social History: [place] No Food Insecurity (08/04/2022)  Housing: Low Risk  (08/04/2022)  Transportation Needs: No Transportation Needs (08/04/2022)  Utilities: Not At Risk (08/04/2022)  Alcohol Screen: Low Risk  (07/29/2022)  Depression (PHQ2-9): Low Risk  (10/12/2022)  Financial Resource Strain: Low Risk  (07/29/2022)  Recent Concern: Financial Resource Strain - Medium Risk (05/27/2022)  Physical Activity: Insufficiently Active (07/29/2022)  Social Connections: Socially Integrated (07/29/2022)  Stress: No Stress Concern Present (07/29/2022)  Tobacco Use: Medium Risk (10/14/2022)   SDOH Interventions:     Readmission Risk Interventions   Row Labels 01/01/2022   11:11 AM  Readmission Risk Prevention Plan   Section Header. No data exists in this row.   Transportation Screening   Complete  Medication Review (Press photographer   Complete  PCP or Specialist appointment within 3-5 days of discharge   Complete  [place]RI or [place]  Complete  SW Recovery Care/Counseling Consult   Complete  [place]  Not Applicable

## 2022-10-15 NOTE — Discharge Summary (Signed)
Physician Discharge Summary   Patient ID: Tony [name] [id number] y.o. 08-08-1941  Admit date: 10/14/2022  Discharge date and time: 10/15/2022  Admitting Physician: [name], MD   Discharge Physician: Michaelle Birks, MD  Admission Diagnoses: Incisional hernia [K43.2]  Discharge Diagnoses: Incisional hernia without obstruction or gangrene  Admission Condition: stable  Discharged Condition: stable  Indication for Admission: [name] is an 82 yo male who presented with a symptomatic supraumbilical incisional hernia, containing small bowel without obstruction. After a discussion of the risks and benefits of surgery, he elected to proceed with repair.  Hospital Course: The patient was taken to the OR on 10/14/22 for an open supraumbilical hernia repair with mesh placement. Please see separately dictated operative note for further details of this procedure. Postoperatively he was admitted for observation given his medical comorbidities. After recovering in PACU he was admitted to the med-surg floor in stable condition. He was advanced to a regular diet, which he tolerated. He ambulated independently. On the morning of POD1, he was ambulatory, pain was controlled, and he was hemodynamically stable with no respiratory issues. He was examined and deemed appropriate for discharge home.   Consults: None  Significant Diagnostic Studies: N/A  Treatments: analgesia: acetaminophen and tramadol and surgery: incisional hernia repair with mesh  Discharge Exam: General: resting comfortably, NAD Neuro: alert and oriented, no focal deficits Resp: normal work of breathing on room air Abdomen: soft, nondistended, nontender to palpation. Supraumbilical incision is clean and dry with no erythema or induration. Extremities: warm and well-perfused   Disposition: Discharge disposition: 01-Home or Self Care       Patient Instructions:  Allergies as of 10/15/2022       Reactions    Penicillins Rash   Has patient had a PCN reaction causing immediate rash, facial/tongue/throat swelling, SOB or lightheadedness with hypotension: Yes Has patient had a PCN reaction causing severe rash involving mucus membranes or skin necrosis: No Has patient had a PCN reaction that required hospitalization: No Has patient had a PCN reaction occurring within the last 10 years: No If all of the above answers are "NO", then may proceed with Cephalosporin use.        Medication List     TAKE these medications    acetaminophen 500 MG tablet Commonly known as: TYLENOL Take 2 tablets (1,000 mg total) by mouth 3 (three) times daily.   albuterol 108 (90 Base) MCG/ACT inhaler Commonly known as: VENTOLIN HFA TAKE 2 PUFFS BY MOUTH EVERY 6 HOURS AS NEEDED FOR WHEEZE OR SHORTNESS OF BREATH   ALPRAZolam 1 MG tablet Commonly known as: XANAX TAKE 0.5-1 TABLETS (0.5-1 MG TOTAL) BY MOUTH 2 (TWO) TIMES DAILY AS NEEDED FOR ANXIETY.   aspirin EC 81 MG tablet Take 81 mg by mouth daily.   atorvastatin 20 MG tablet Commonly known as: LIPITOR TAKE 1 TABLET BY MOUTH DAILY   benzonatate 100 MG capsule Commonly known as: TESSALON TAKE 1 CAPSULE BY MOUTH EVERY DAY AS NEEDED   citalopram 10 MG tablet Commonly known as: CELEXA Take 1 tablet (10 mg total) by mouth daily.   diclofenac Sodium 1 % Gel Commonly known as: VOLTAREN AAPLY 4 GRAMS TOPICALLY 4 TIMES DAILY What changed: See the new instructions.   docusate sodium 100 MG capsule Commonly known as: COLACE Take 1 capsule (100 mg total) by mouth 2 (two) times daily.   enzalutamide 40 MG tablet Commonly known as: Xtandi Take 4 tablets (160 mg total) by mouth daily.  fluticasone 50 MCG/ACT nasal spray Commonly known as: FLONASE PLACE 1 SPRAY INTO BOTH NOSTRILS DAILY AS NEEDED FOR ALLERGIES OR RHINITIS (AT BEDTIME).   furosemide 20 MG tablet Commonly known as: LASIX TAKE 1 TABLET BY MOUTH DAILY   magnesium hydroxide 400 MG/5ML  suspension Commonly known as: MILK OF MAGNESIA Take 30 mLs by mouth daily as needed for mild constipation.   Melatonin 10 MG Caps Take 20 mg by mouth at bedtime.   metoprolol tartrate 25 MG tablet Commonly known as: LOPRESSOR TAKE 1 TABLET BY MOUTH TWICE A DAY   multivitamin with minerals Tabs tablet Take 0.5 tablets by mouth daily.   omeprazole 20 MG capsule Commonly known as: PRILOSEC TAKE 1 CAPSULE BY MOUTH DAILY BEFORE BREAKFAST.   Spiriva Respimat 2.5 MCG/ACT Aers Generic drug: Tiotropium Bromide Monohydrate Inhale 2 puffs into the lungs daily.   traMADol 50 MG tablet Commonly known as: ULTRAM Take 1 tablet (50 mg total) by mouth every 6 (six) hours as needed for up to 5 days for severe pain.       Activity: no driving while on analgesics and no heavy lifting for 8 weeks Diet: regular diet Wound Care: keep wound clean and dry, ok to shower  Follow-up with Dr. Zenia Resides on 11/02/22.  Signed: [name] 10/15/2022 7:47 AM

## 2022-10-15 NOTE — Progress Notes (Signed)
Pt and wife given detailed d/c instructions. All questions answered. Pt taken via wheelchair to front entrance by NT to meet ride.

## 2022-10-15 NOTE — TOC Transition Note (Signed)
Transition of Care [facility]) - CM/SW Discharge Note   Patient Details  Name: Tony [name] MRN: [id number] Date of Birth: 1941/05/07  [facility]) CM/SW Contact:  Leeroy Cha, RN Phone Number: 10/15/2022, 8:29 AM   Clinical Narrative:    Patient dcd to home with no toc needs.   Final next level of care: Home/Self Care Barriers to Discharge: Barriers Resolved   Patient Goals and CMS Choice CMS Medicare.gov Compare Post Acute Care list provided to:: Patient    Discharge Placement                         Discharge Plan and Services Additional resources added to the After Visit Summary for     Discharge Planning Services: CM Consult                                 Social Determinants of Health (SDOH) Interventions SDOH Screenings   Food Insecurity: No Food Insecurity (08/04/2022)  Housing: Low Risk  (08/04/2022)  Transportation Needs: No Transportation Needs (08/04/2022)  Utilities: Not At Risk (08/04/2022)  Alcohol Screen: Low Risk  (07/29/2022)  Depression (PHQ2-9): Low Risk  (10/12/2022)  Financial Resource Strain: Low Risk  (07/29/2022)  Recent Concern: Financial Resource Strain - Medium Risk (05/27/2022)  Physical Activity: Insufficiently Active (07/29/2022)  Social Connections: Socially Integrated (07/29/2022)  Stress: No Stress Concern Present (07/29/2022)  Tobacco Use: Medium Risk (10/14/2022)     Readmission Risk Interventions   Row Labels 01/01/2022   11:11 AM  Readmission Risk Prevention Plan   Section Header. No data exists in this row.   Transportation Screening   Complete  Medication Review Press photographer)   Complete  PCP or Specialist appointment within 3-5 days of discharge   Complete  HRI or [place]   Complete  SW Recovery Care/Counseling Consult   Complete  [place]   Not Applicable

## 2022-10-15 NOTE — Progress Notes (Signed)
Patient examined this morning. Incision clean and dry. Pain controlled. No nausea or vomiting. Hemodynamically stable with no respiratory issues overnight. Plan for discharge home today. Discussed plan with patient and wife and reviewed postop instructions.  Michaelle Birks, MD [facility] General, Hepatobiliary and Pancreatic Surgery 10/15/22 7:46 AM

## 2022-10-20 ENCOUNTER — Encounter (HOSPITAL_COMMUNITY): Payer: Self-pay | Admitting: Surgery

## 2022-10-26 ENCOUNTER — Ambulatory Visit (INDEPENDENT_AMBULATORY_CARE_PROVIDER_SITE_OTHER)
Admission: RE | Admit: 2022-10-26 | Discharge: 2022-10-26 | Disposition: A | Discharge status: Home / Self Care | Payer: Medicare Other | Source: Ambulatory Visit | Attending: Family Medicine | Admitting: Family Medicine

## 2022-10-26 ENCOUNTER — Encounter: Payer: Self-pay | Admitting: Family Medicine

## 2022-10-26 ENCOUNTER — Ambulatory Visit (INDEPENDENT_AMBULATORY_CARE_PROVIDER_SITE_OTHER): Payer: Medicare Other | Admitting: Family Medicine

## 2022-10-26 VITALS — BP 128/80 | HR 91 | Temp 97.8°F | Resp 16 | Ht 67.0 in | Wt 201.4 lb

## 2022-10-26 DIAGNOSIS — F064 Anxiety disorder due to known physiological condition: Secondary | ICD-10-CM

## 2022-10-26 DIAGNOSIS — G4700 Insomnia, unspecified: Secondary | ICD-10-CM

## 2022-10-26 DIAGNOSIS — J449 Chronic obstructive pulmonary disease, unspecified: Secondary | ICD-10-CM | POA: Diagnosis not present

## 2022-10-26 DIAGNOSIS — R053 Chronic cough: Secondary | ICD-10-CM

## 2022-10-26 IMAGING — DX DG CHEST 1V PORT
1 series · 1 of 1 positions shown · non-contrast
Comparison: August 19, 2018

CLINICAL DATA: Shortness of breath

EXAM:
PORTABLE CHEST 1 VIEW

[chest ap]
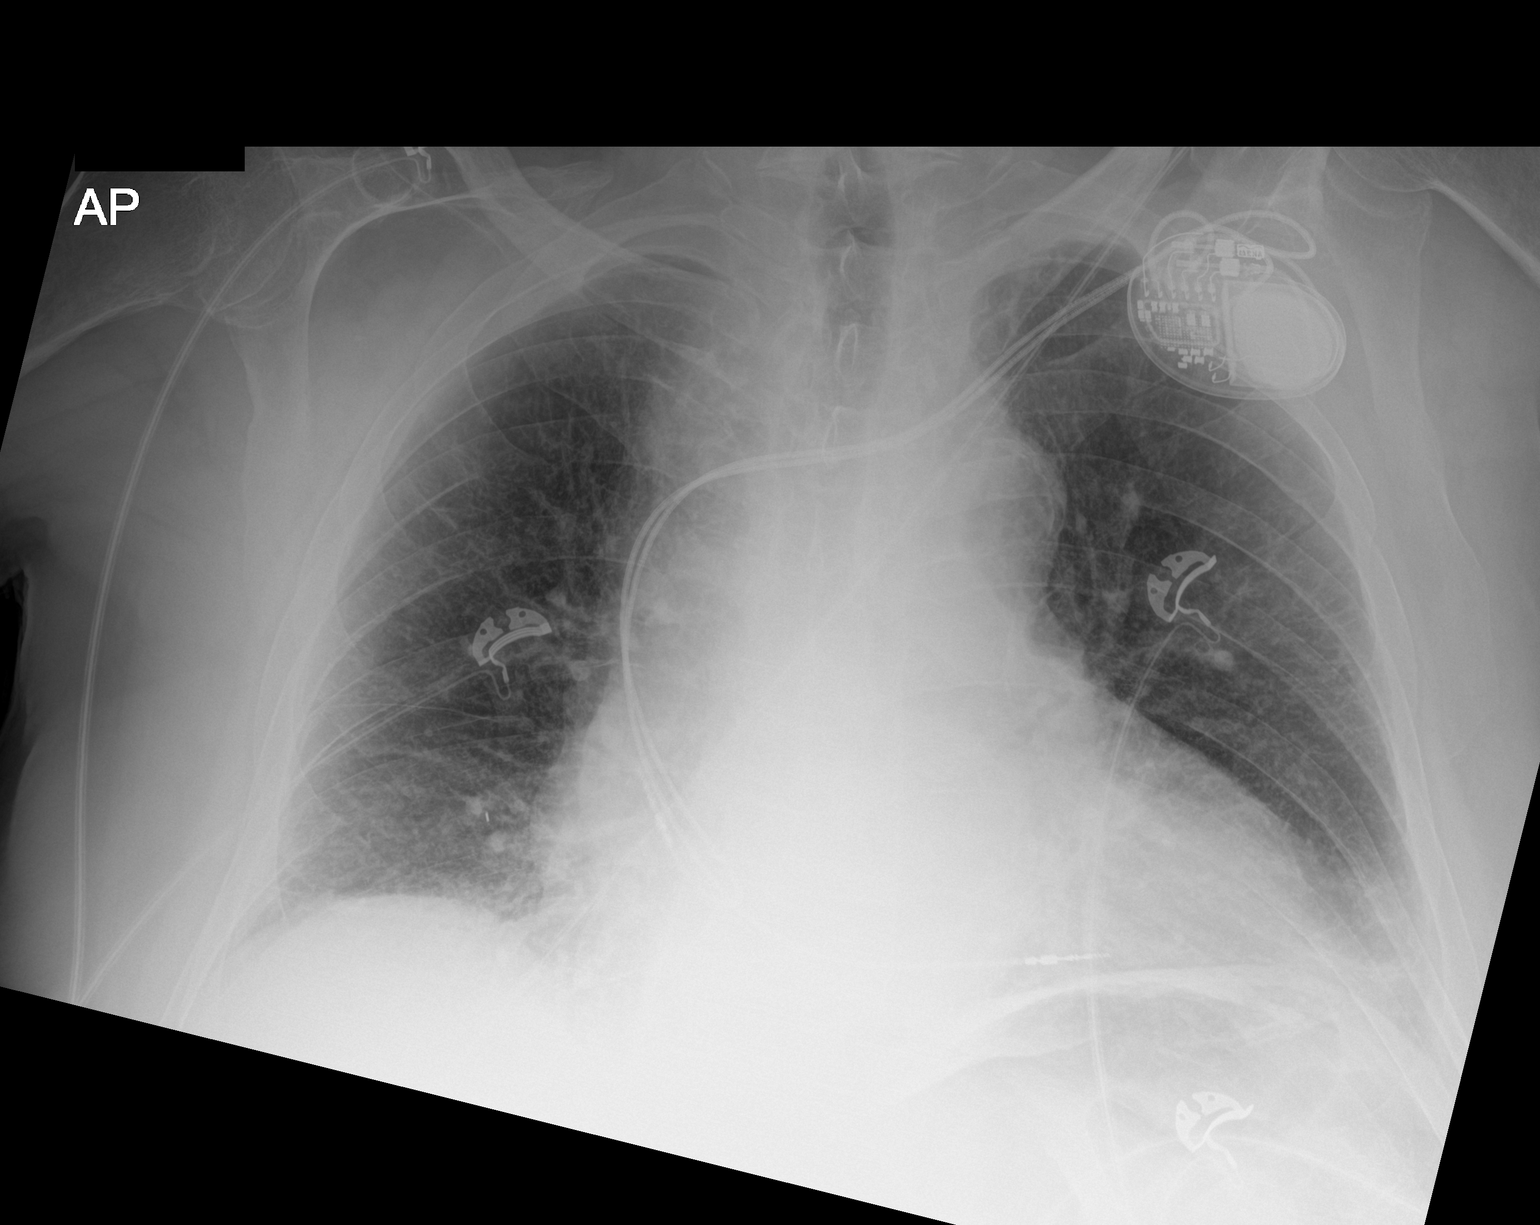

[1 of 1 positions shown; findings below may reference images not displayed]

FINDINGS: The heart size and mediastinal contours are mildly enlarged. Aortic
knob calcifications are seen. A left-sided pacemaker seen with the
lead tips in the right atrium right ventricle. Both lungs are clear.
The visualized skeletal structures are unremarkable.
IMPRESSION: No active disease.

## 2022-10-26 IMAGING — CT CT ANGIO CHEST-ABD-PELV FOR DISSECTION W/ AND WO/W CM
2 of 9 series · 14 of 46 positions shown, 16 images · IV contrast (Omnipaque)
Comparison: Chest x-ray from earlier in the same day, CT from
01/19/2020

CLINICAL DATA: Chest and abdominal pain

EXAM:
CT ANGIOGRAPHY CHEST, ABDOMEN AND PELVIS
TECHNIQUE: Non-contrast CT of the chest was initially obtained.
Multidetector CT imaging through the chest, abdomen and pelvis was
performed using the standard protocol during bolus administration of
intravenous contrast. Multiplanar reconstructed images and MIPs were
obtained and reviewed to evaluate the vascular anatomy.
CONTRAST:  100mL OMNIPAQUE 350

[Series 4: axial arterial · axial · arterial · 0.98mm/px · z∈[-493,+68]mm · 11 of 209 slices shown, 13 images]
[im 11/209  soft-tissue]
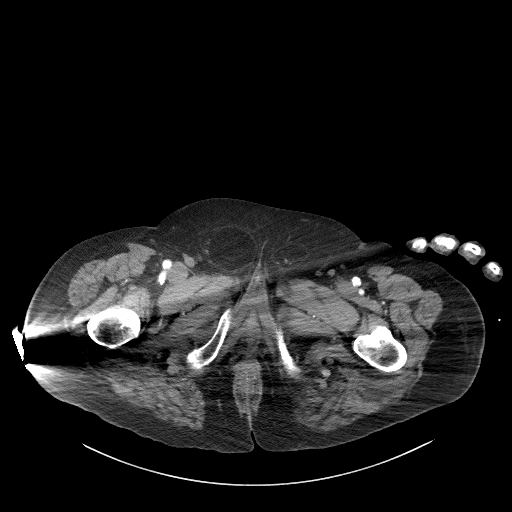
[im 11/209  bone]
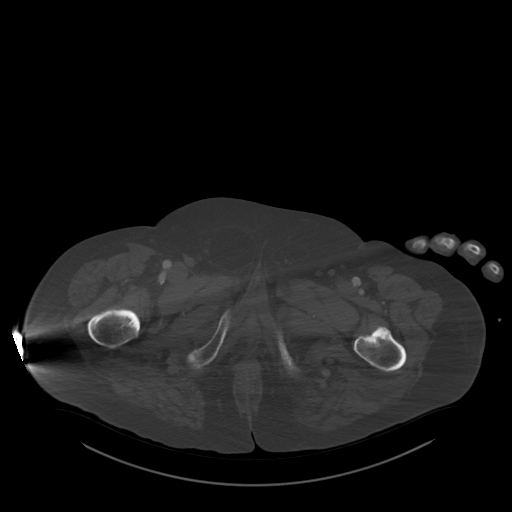
[im 33/209  soft-tissue]
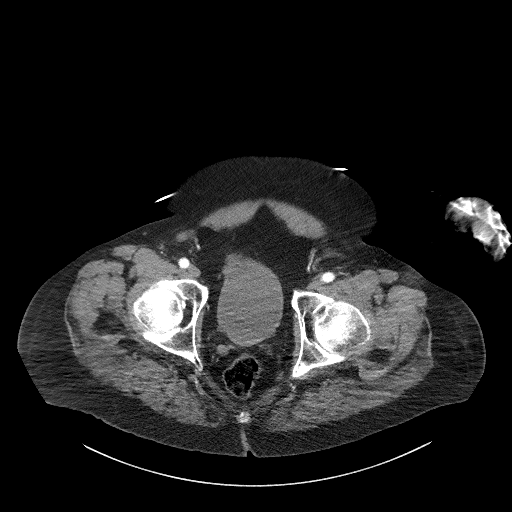
[im 55/209  soft-tissue]
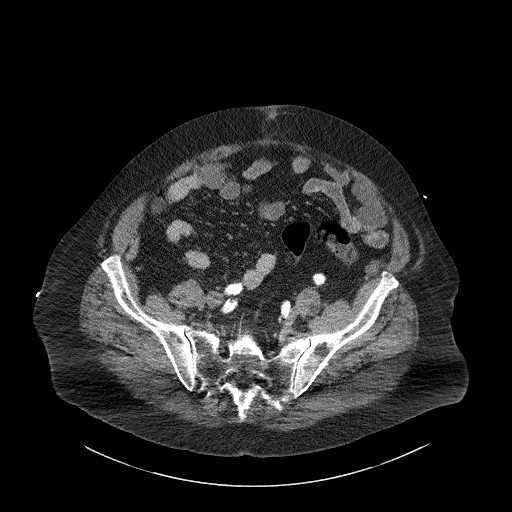
[im 66/209  soft-tissue]
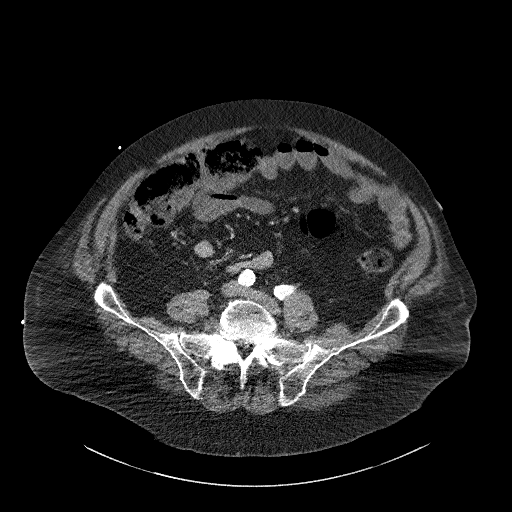
[im 88/209  soft-tissue]
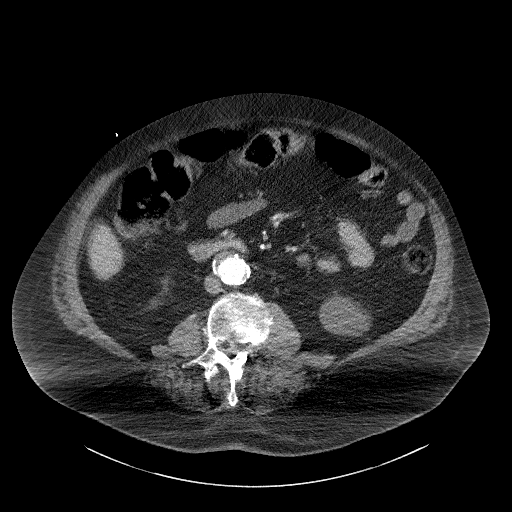
[im 110/209  soft-tissue]
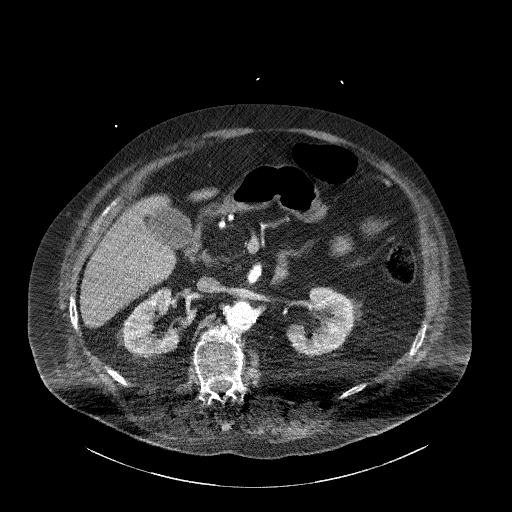
[im 121/209  soft-tissue]
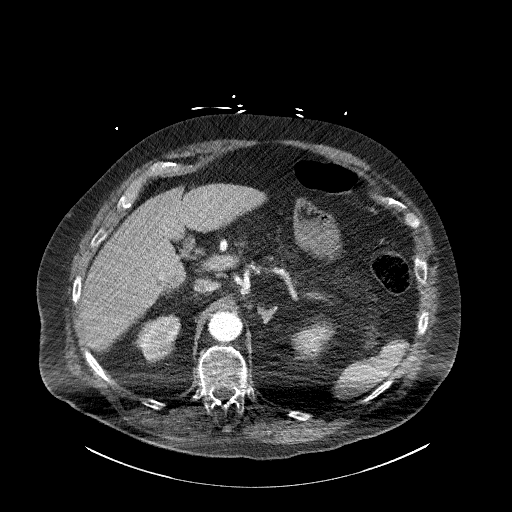
[im 143/209  soft-tissue]
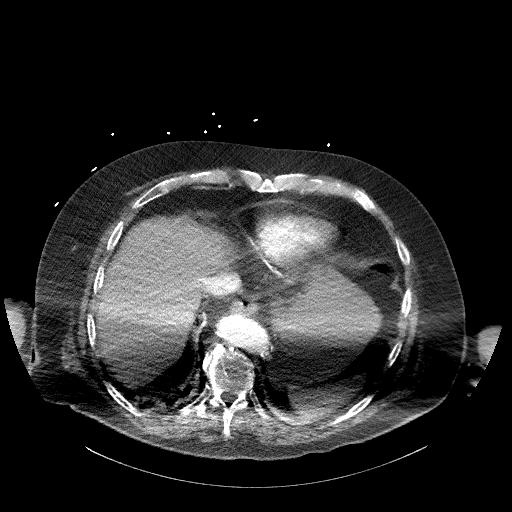
[im 154/209  soft-tissue]
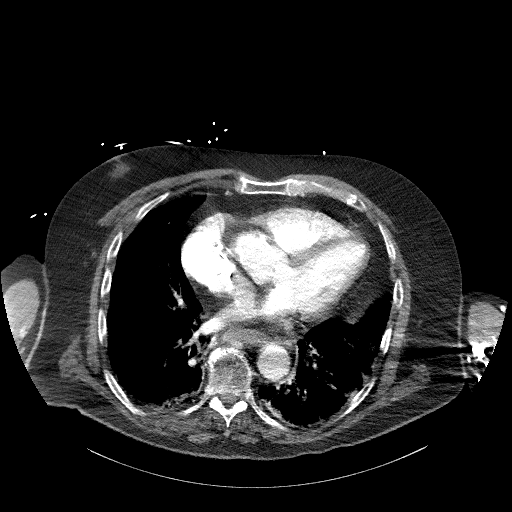
[im 154/209  bone]
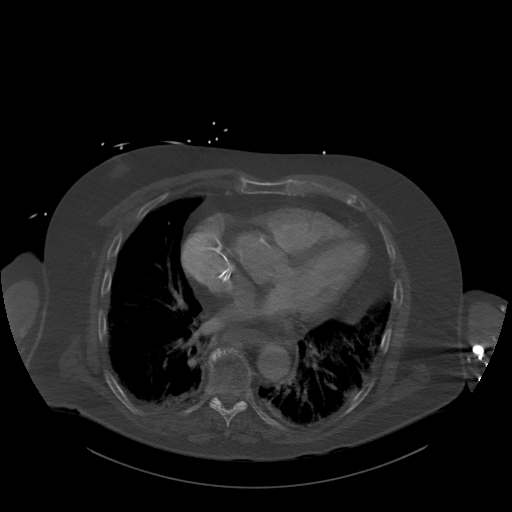
[im 176/209  soft-tissue]
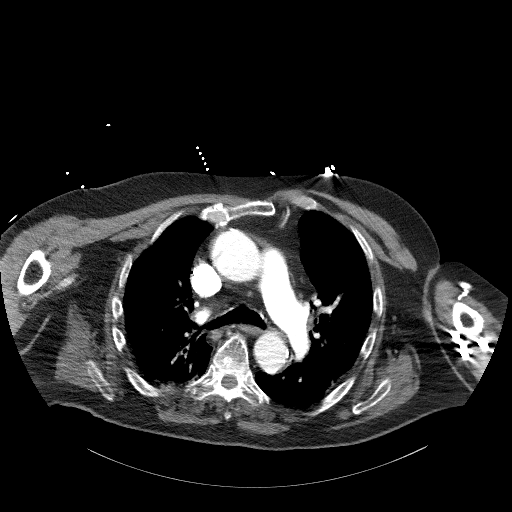
[im 198/209  soft-tissue]
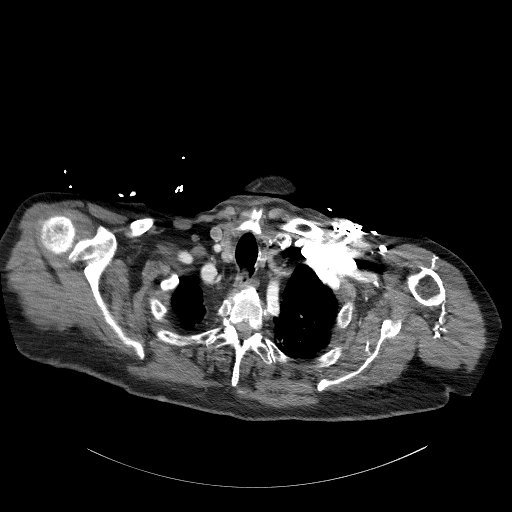

[Series 6: coronals · coronal · 0.99mm/px · 3 of 211 slices shown]
[im 53/211  soft-tissue]
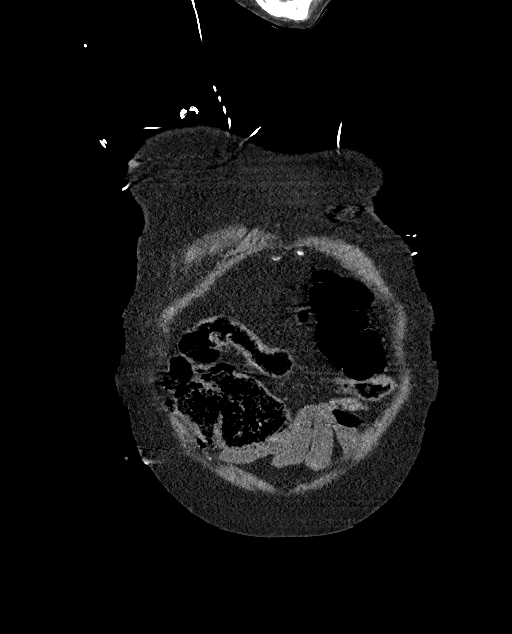
[im 106/211  soft-tissue]
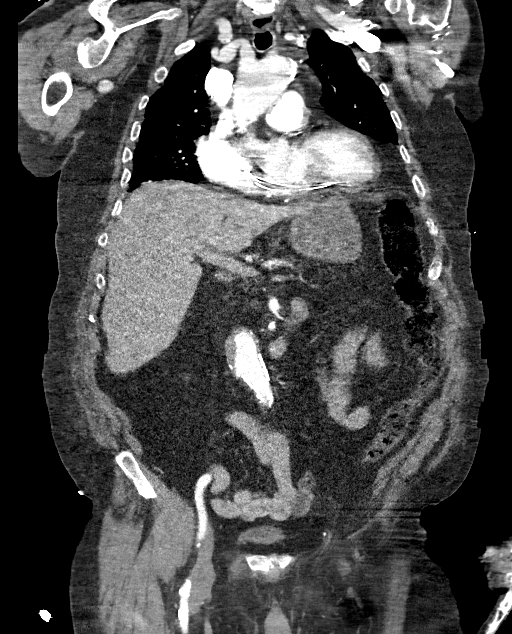
[im 158/211  soft-tissue]
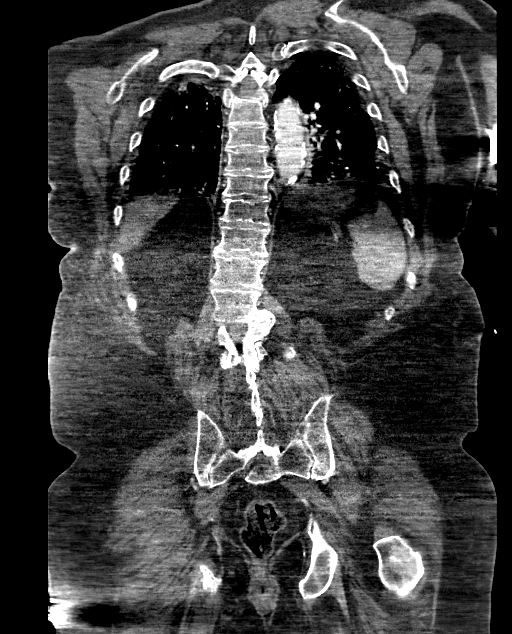

[14 of 46 positions shown; findings below may reference images not displayed]

FINDINGS: CTA CHEST FINDINGS

Cardiovascular: Precontrast images demonstrate heavy atherosclerotic
calcifications. No hyperdense crescent to suggest acute aortic
abnormality is seen. The thoracic aorta demonstrates normal
opacification. Dilatation of the ascending aorta to 4.6 cm is again
identified and stable. No findings to suggest dissection are seen.
Normal tapering in the thoracic aortic arch is noted. Descending
thoracic aorta shows no aneurysmal dilatation. Coronary
calcifications are seen. No cardiac enlargement is noted. Pacing
wires are again seen.

Mediastinum/Nodes: Thoracic inlet is within normal limits. No
sizable hilar or mediastinal adenopathy is noted. The esophagus as
visualized is within normal limits.

Lungs/Pleura: Diffuse emphysematous changes are identified.
Scattered fibrotic changes are seen as well as mild bibasilar
atelectasis. No sizable parenchymal nodules are seen.

Musculoskeletal: Degenerative changes of the thoracic spine are
noted. No compression deformities are noted. No acute fracture is
seen.

Review of the MIP images confirms the above findings.

CTA ABDOMEN AND PELVIS FINDINGS

VASCULAR

Aorta: Abdominal aorta demonstrates atherosclerotic calcifications
as well as mural thrombus particularly in the aneurysmal portion in
the infrarenal aorta. Maximum dimension of the infrarenal aorta is
3.8 cm. This is stable from the prior exam. No dissection is seen.

Celiac: High-grade stenosis of the celiac axis is noted similar to
that seen on the prior exam.

SMA: Calcified plaque is noted without focal stenosis.

Renals: Single renal arteries are identified bilaterally with
atherosclerotic calcifications. No focal stenosis is seen.

IMA: Origin of the IMA is occluded although there is reconstitution
via branches of the SMA.

Iliacs: Atherosclerotic calcifications are noted without aneurysmal
dilatation. No dissection is seen.

Veins: No specific venous abnormality is noted.

Review of the MIP images confirms the above findings.

NON-VASCULAR

Hepatobiliary: No focal liver abnormality is seen. No gallstones,
gallbladder wall thickening, or biliary dilatation.

Pancreas: Diffuse fatty replacement of the pancreas is noted.

Spleen: Normal in size without focal abnormality.

Adrenals/Urinary Tract: Adrenal glands are within normal limits.
Kidneys demonstrate a normal enhancement pattern. Stable renal cysts
are noted on the left. Scarring with calcification is noted on the
right stable in appearance. No calculi or obstructive changes are
seen. The bladder is partially distended.

Stomach/Bowel: Scattered diverticular change of the colon is noted
without evidence of diverticulitis. The appendix is not visualized
consistent with the prior surgical history. No small bowel
abnormality is seen. The stomach is within normal limits.

Lymphatic: No significant lymphadenopathy is seen.

Reproductive: Prostate brachytherapy seeds are noted.

Other: Fat containing inguinal hernias are noted bilaterally right
greater than left.

Musculoskeletal: Degenerative changes of lumbar spine are seen.

Review of the MIP images confirms the above findings.
IMPRESSION: 4.6 cm ascending thoracic aortic aneurysm stable in appearance from
the prior exam. Ascending thoracic aortic aneurysm. Recommend
semi-annual imaging followup by CTA or MRA and referral to
cardiothoracic surgery if not already obtained. This recommendation
follows 4484 ACCF/AHA/AATS/ACR/ASA/SCA/JOSHJAX/BAKOA/UNNATI/BEI Guidelines
for the Diagnosis and Management of Patients With Thoracic Aortic
Disease. Circulation. 4484; 121: E266-e369. Aortic aneurysm NOS
(NGIFP-XSJ.Q)

3.8 cm infrarenal abdominal aortic aneurysm also stable from the
prior study. Recommend follow-up ultrasound every 2 years. This
recommendation follows ACR consensus guidelines: White Paper of the
ACR Incidental Findings Committee II on Vascular Findings. [HOSPITAL] 1281; [DATE].

High-grade proximal stenosis of the celiac axis stable in
appearance. Proximal occlusion of the IMA is noted as well.

Diverticular change without diverticulitis.

No acute abnormality noted.

Aortic Atherosclerosis (NGIFP-8VL.L) and Emphysema (NGIFP-BRS.E).

## 2022-10-26 MED ORDER — SPIRIVA RESPIMAT 2.5 MCG/ACT IN AERS: 2.0000 | INHALATION_SPRAY | Freq: Every day | RESPIRATORY_TRACT | 5 refills | Status: DC

## 2022-10-26 MED ORDER — GABAPENTIN 300 MG PO CAPS: 300.0000 mg | ORAL_CAPSULE | Freq: Every day | ORAL | 2 refills | Status: DC

## 2022-10-26 MED ORDER — ALPRAZOLAM 1 MG PO TABS: 0.5000 mg | ORAL_TABLET | Freq: Two times a day (BID) | ORAL | 2 refills | Status: DC | PRN

## 2022-10-26 NOTE — Assessment & Plan Note (Addendum)
Problem has been worse since abdominal surgery. Lung auscultation negative today. Chest x-ray ordered. We discussed other treatment options, he will resume Spiriva for COPD treatment. Gabapentin 300 mg added today at bedtime.  We discussed side effects. If problem does not improve, will recommend following with his pulmonologist. Follow-up in 2 months.

## 2022-10-26 NOTE — Patient Instructions (Addendum)
A few things to remember from today's visit:  COPD mixed type ([place]) - Plan: Tiotropium Bromide Monohydrate (SPIRIVA RESPIMAT) 2.5 MCG/ACT AERS, DG Chest 2 View  Anxiety disorder due to known physiological condition - Plan: ALPRAZolam (XANAX) 1 MG tablet  Chronic cough - Plan: DG Chest 2 View, gabapentin (NEURONTIN) 300 MG capsule  Resume Spiriva. Gabapentin at bedtime started today to help with cough. Continue Alprazolam 1/2 tab 2 times daily as needed. Count tabs when you receive prescription,you are supposed to get 30 tabs.   If you need refills for medications you take chronically, please call your pharmacy. Do not use My Chart to request refills or for acute issues that need immediate attention. If you send a my chart message, it may take a few days to be addressed, specially if I am not in the office.  Please be sure medication list is accurate. If a new problem present, please set up appointment sooner than planned today.

## 2022-10-26 NOTE — Assessment & Plan Note (Signed)
Today he is reporting that he is taking alprazolam 1 mg 1/2 tablet twice daily, for the past few months he has been receiving 60 tablets/month, ran out 1 to 2 weeks ago. Stressed the importance of keeping medication in a safe place. If there is a concern about the number of tablets he is getting at the pharmacy, recommend counting tablets in front of pharmacist. If running out of medication before refill is due becomes an issue in the future, we will have to wean off medication. He and his wife voiced understanding. We called pharmacy to change number of tablets of Alprazolam. Continue Celexa 10 mg daily.

## 2022-10-26 NOTE — Progress Notes (Signed)
ACUTE VISIT Chief Complaint  Patient presents with   Cough    Over a week, since operation. Productive.   HPI: [name] is a 82 y.o. 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 He has history of chronic cough, currently he is on benzonatate 100 mg daily as needed. He noticed that cough has been worse since incisional hernia repair on 10/14/2022.  Cough This is a recurrent problem. The problem has been gradually worsening. The problem occurs constantly. The cough is Productive of sputum. Associated symptoms include nasal congestion, postnasal drip, rhinorrhea and wheezing. Pertinent negatives include no chest pain, ear congestion, headaches, heartburn, hemoptysis, myalgias, rash, sore throat, shortness of breath, sweats or weight loss.   He reports coughing up brown mucus and experiencing coughing episodes at night, which disrupt sleep.  He denies having fever or chills and reports a stable appetite.  He mentions experiencing wheezing, particularly at night. He denies orthopnea, PND, or worsening edema.  COPD: Currently he is on albuterol and Symbicort (not sure about dose) twice daily, the latter one has been prescribed by his pulmonologist. He also reports being out of Spiriva for a few months.  He has an upcoming appointment with his pulmonologist on June 7th.   He is also complaining about difficulty sleeping for the night. His wife mentions that he has not been sleeping well since running out of alprazolam, 1 to 2 weeks ago. He reports taking alprazolam 1 mg 1/2 tablet twice daily, for the past few months he is supposed to be getting 60 tablets/month. His wife thinks they did not get their right amount at the pharmacy.  She has raised  this concern in the past when finding out before he was due for refills. He is also on Celexa 10 mg daily, which has helped with anxiety. History of advanced prostate cancer, he gets up a few times throughout the night to urinate.  He follows with urologist.  Review of Systems  Constitutional:  Positive for fatigue. Negative for weight loss.  HENT:  Positive for postnasal drip and rhinorrhea. Negative for sore throat.   Respiratory:  Positive for cough and wheezing. Negative for hemoptysis and shortness of breath.   Cardiovascular:  Negative for chest pain.  Gastrointestinal:  Negative for abdominal pain, heartburn, nausea and vomiting.  Genitourinary:  Negative for dysuria and hematuria.  Musculoskeletal:  Positive for gait problem. Negative for myalgias.  Skin:  Negative for rash.  Neurological:  Negative for syncope and headaches.  See other pertinent positives and negatives in HPI.  Current Outpatient Medications on File Prior to Visit  Medication Sig Dispense Refill   acetaminophen (TYLENOL) 500 MG tablet Take 2 tablets (1,000 mg total) by mouth 3 (three) times daily. 30 tablet 0   albuterol (VENTOLIN HFA) 108 (90 Base) MCG/ACT inhaler TAKE 2 PUFFS BY MOUTH EVERY 6 HOURS AS NEEDED FOR WHEEZE OR SHORTNESS OF BREATH 8.5 each 12   aspirin EC 81 MG tablet Take 81 mg by mouth daily.     atorvastatin (LIPITOR) 20 MG tablet TAKE 1 TABLET BY MOUTH DAILY 90 tablet 3   benzonatate (TESSALON) 100 MG capsule TAKE 1 CAPSULE BY MOUTH EVERY DAY AS NEEDED 30 capsule 1   citalopram (CELEXA) 10 MG tablet Take 1 tablet (10 mg total) by  mouth daily. 90 tablet 3   diclofenac Sodium (VOLTAREN) 1 % GEL AAPLY 4 GRAMS TOPICALLY 4 TIMES DAILY (Patient taking differently: Apply 1 Application topically 4 (four) times daily as needed (pain).) 400 g 2   docusate sodium (COLACE) 100 MG capsule Take 1 capsule (100 mg total) by mouth 2 (two) times daily. 10 capsule 0   enzalutamide (XTANDI) 40 MG tablet Take 4 tablets  (160 mg total) by mouth daily. 120 tablet 3   fluticasone (FLONASE) 50 MCG/ACT nasal spray PLACE 1 SPRAY INTO BOTH NOSTRILS DAILY AS NEEDED FOR ALLERGIES OR RHINITIS (AT BEDTIME). 48 mL 1   furosemide (LASIX) 20 MG tablet TAKE 1 TABLET BY MOUTH DAILY 90 tablet 3   magnesium hydroxide (MILK OF MAGNESIA) 400 MG/5ML suspension Take 30 mLs by mouth daily as needed for mild constipation.     Melatonin 10 MG CAPS Take 20 mg by mouth at bedtime.     metoprolol tartrate (LOPRESSOR) 25 MG tablet TAKE 1 TABLET BY MOUTH TWICE A DAY 180 tablet 3   Multiple Vitamin (MULTIVITAMIN WITH MINERALS) TABS tablet Take 0.5 tablets by mouth daily.     omeprazole (PRILOSEC) 20 MG capsule TAKE 1 CAPSULE BY MOUTH DAILY BEFORE BREAKFAST. 90 capsule 3   No current facility-administered medications on file prior to visit.   Past Medical History:  Diagnosis Date   Anxiety    Arthritis    COPD (chronic obstructive pulmonary disease) ([place])    Coronary artery disease    Dyspnea    Gastric outlet obstruction 02/03/2018   [name] 02/03/2018   GERD (gastroesophageal reflux disease)    History of hiatal hernia    small/notes 02/03/2018   Hypertension    no meds   Presence of permanent cardiac pacemaker    Prostate cancer ([place]) 02/08/2014   Gleason 4+5=9, PSA 15.65   Radiation    Sinus problem    SSS (sick sinus syndrome) ([facility])    Allergies  Allergen Reactions   Penicillins Rash    Has patient had a PCN reaction causing immediate rash, facial/tongue/throat swelling, SOB or lightheadedness with hypotension: Yes Has patient had a PCN reaction causing severe rash involving mucus membranes or skin necrosis: No Has patient had a PCN reaction that required hospitalization: No Has patient had a PCN reaction occurring within the last 10 years: No If all of the above answers are "NO", then may proceed with Cephalosporin use.    Social History   Socioeconomic History   Marital status: Married    Spouse name: Not on file    Number of children: 3   Years of education: Not on file   Highest education level: Not on file  Occupational History   Occupation: Freight forwarder    Employer: [employer]    Comment: retired   Occupation: Freight forwarder    Comment: [employer]-[place]  Tobacco Use   Smoking status: Former    Types: Cigars    Quit date: 09/14/2012    Years since quitting: 10.1   Smokeless tobacco: Never   Tobacco comments:    little cigars; the small ones"  Vaping Use   Vaping Use: Never used  Substance and Sexual Activity   Alcohol use: Not Currently    Alcohol/week: 14.0 standard drinks of alcohol    Types: 14 Cans of beer per week   Drug use: No   Sexual activity: Not Currently  Other Topics Concern   Not on file  Social History Narrative   Lives with wife  and 2 dogs in one level home; daughter and her family co-habitate.   Has three daughters, all in , supportive. Five grandchildren.   Wants to return to doing silver sneakers once gym re-opens.         Social Determinants of Health   Financial Resource Strain: Low Risk  (07/29/2022)   Overall Financial Resource Strain (CARDIA)    Difficulty of Paying Living Expenses: Not hard at all  Recent Concern: Financial Resource Strain - Medium Risk (05/27/2022)   Overall Financial Resource Strain (CARDIA)    Difficulty of Paying Living Expenses: Somewhat hard  Food Insecurity: No Food Insecurity (10/15/2022)   Hunger Vital Sign    Worried About Running Out of Food in the Last Year: Never true    [place] in the Last Year: Never true  Transportation Needs: No Transportation Needs (10/15/2022)   PRAPARE - Hydrologist (Medical): No    Lack of Transportation (Non-Medical): No  Physical Activity: Insufficiently Active (07/29/2022)   Exercise Vital Sign    Days of Exercise per Week: 5 days    Minutes of Exercise per Session: 20 min  Stress: No Stress Concern Present (07/29/2022)   [place]    Feeling of Stress : Not at all  Social Connections: [place] (07/29/2022)   Social Connection and Isolation Panel [NHANES]    Frequency of Communication with Friends and Family: More than three times a week    Frequency of Social Gatherings with Friends and Family: More than three times a week    Attends Religious Services: More than 4 times per year    Active Member of [employer] or Organizations: Yes    Attends Archivist Meetings: More than 4 times per year    Marital Status: Married   Vitals:   10/26/22 1211  BP: 128/80  Pulse: 91  Resp: 16  Temp: 97.8 F (36.6 C)  SpO2: 95%   Body mass index is 31.54 kg/m.  Physical Exam Vitals and nursing note reviewed.  Constitutional:      General: He is not in acute distress.    Appearance: He is well-developed and well-groomed.  HENT:     Head: Normocephalic and atraumatic.     Right Ear: Tympanic membrane, ear canal and external ear normal.     Left Ear: Tympanic membrane, ear canal and external ear normal.     Mouth/Throat:     Mouth: Mucous membranes are moist.     Dentition: Has dentures.     Pharynx: Oropharynx is clear.  Eyes:     Conjunctiva/sclera: Conjunctivae normal.  Cardiovascular:     Rate and Rhythm: Normal rate and regular rhythm.     Heart sounds: No murmur heard. Pulmonary:     Effort: Pulmonary effort is normal. No respiratory distress.     Breath sounds: Normal breath sounds. No wheezing, rhonchi or rales.  Lymphadenopathy:     Cervical: No cervical adenopathy.  Skin:    General: Skin is warm.     Findings: No erythema or rash.  Neurological:     Mental Status: He is alert and oriented to person, place, and time.     Comments: Gait assisted by a cane.  Psychiatric:        Mood and Affect: Affect normal. Mood is anxious.    ASSESSMENT AND PLAN: Mr. Kuligowski is a 82 yo male seen today for worsening cough.  Chronic cough Assessment &  Plan: Problem has been worse since abdominal surgery. Lung auscultation negative today. Chest x-ray ordered. We discussed other treatment options, he will resume Spiriva for COPD treatment. Gabapentin 300 mg added today at bedtime.  We discussed side effects. If problem does not improve, will recommend following with his pulmonologist. Follow-up in 2 months.  Orders: -     DG Chest 2 View; Future -     Gabapentin; Take 1 capsule (300 mg total) by mouth at bedtime.  Dispense: 30 capsule; Refill: 2  Insomnia, unspecified type Assessment & Plan: We discussed possible etiologies, some of his chronic medical problems are contributing factor. Gabapentin added today for cough will help with the sleep. Continue alprazolam 1 mg 1/2 tablet at bedtime. Also recommend good sleep hygiene. Follow-up in 2 months.   COPD mixed type [facility]) Assessment & Plan: This problem could be contributing to his cough. Last PFT 08/2020 which showed minimal obstruction, no response to bronchodilators. Lung auscultation negative today. Recommend resuming Spiriva Respimat 2.5 mcg 2 puff daily. Continue following with pulmonologist.  Orders: -     Spiriva Respimat; Inhale 2 puffs into the lungs daily.  Dispense: 4 g; Refill: 5 -     DG Chest 2 View; Future  Anxiety disorder due to known physiological condition Assessment & Plan: Today he is reporting that he is taking alprazolam 1 mg 1/2 tablet twice daily, for the past few months he has been receiving 60 tablets/month, ran out 1 to 2 weeks ago. Stressed the importance of keeping medication in a safe place. If there is a concern about the number of tablets he is getting at the pharmacy, recommend counting tablets in front of pharmacist. If running out of medication before refill is due becomes an issue in the future, we will have to wean off medication. He and his wife voiced understanding. We called pharmacy to change number of tablets of  Alprazolam. Continue Celexa 10 mg daily.  Orders: -     ALPRAZolam; Take 0.5 tablets (0.5 mg total) by mouth 2 (two) times daily as needed for anxiety.  Dispense: 30 tablet; Refill: 2  Return in about 10 weeks (around 01/04/2023).  Hilbert Briggs G. [name], MD  [facility]. [place] office.

## 2022-10-26 NOTE — Assessment & Plan Note (Signed)
We discussed possible etiologies, some of his chronic medical problems are contributing factor. Gabapentin added today for cough will help with the sleep. Continue alprazolam 1 mg 1/2 tablet at bedtime. Also recommend good sleep hygiene. Follow-up in 2 months.

## 2022-10-26 NOTE — Assessment & Plan Note (Signed)
This problem could be contributing to his cough. Last PFT 08/2020 which showed minimal obstruction, no response to bronchodilators. Lung auscultation negative today. Recommend resuming Spiriva Respimat 2.5 mcg 2 puff daily. Continue following with pulmonologist.

## 2022-11-02 DIAGNOSIS — K432 Incisional hernia without obstruction or gangrene: Secondary | ICD-10-CM | POA: Diagnosis not present

## 2022-11-02 DIAGNOSIS — Z8719 Personal history of other diseases of the digestive system: Secondary | ICD-10-CM | POA: Diagnosis not present

## 2022-11-02 DIAGNOSIS — Z9889 Other specified postprocedural states: Secondary | ICD-10-CM | POA: Diagnosis not present

## 2022-11-02 DIAGNOSIS — G4733 Obstructive sleep apnea (adult) (pediatric): Secondary | ICD-10-CM | POA: Diagnosis not present

## 2022-11-05 ENCOUNTER — Other Ambulatory Visit: Payer: Self-pay

## 2022-11-05 ENCOUNTER — Other Ambulatory Visit: Payer: Self-pay | Admitting: Hematology

## 2022-11-05 DIAGNOSIS — C61 Malignant neoplasm of prostate: Secondary | ICD-10-CM

## 2022-11-05 MED ORDER — ENZALUTAMIDE 40 MG PO TABS: 160.0000 mg | ORAL_TABLET | Freq: Every day | ORAL | 0 refills | Status: DC

## 2022-11-10 DIAGNOSIS — H353131 Nonexudative age-related macular degeneration, bilateral, early dry stage: Secondary | ICD-10-CM | POA: Diagnosis not present

## 2022-11-10 DIAGNOSIS — H25811 Combined forms of age-related cataract, right eye: Secondary | ICD-10-CM | POA: Diagnosis not present

## 2022-11-10 DIAGNOSIS — H25812 Combined forms of age-related cataract, left eye: Secondary | ICD-10-CM | POA: Diagnosis not present

## 2022-11-11 ENCOUNTER — Other Ambulatory Visit (HOSPITAL_COMMUNITY): Payer: Self-pay

## 2022-11-12 DIAGNOSIS — J9611 Chronic respiratory failure with hypoxia: Secondary | ICD-10-CM | POA: Diagnosis not present

## 2022-11-16 ENCOUNTER — Other Ambulatory Visit: Payer: Self-pay | Admitting: Family Medicine

## 2022-11-16 DIAGNOSIS — I10 Essential (primary) hypertension: Secondary | ICD-10-CM

## 2022-11-17 ENCOUNTER — Ambulatory Visit: Payer: Medicare Other | Admitting: Oncology

## 2022-11-17 ENCOUNTER — Ambulatory Visit: Payer: Medicare Other

## 2022-11-17 ENCOUNTER — Other Ambulatory Visit: Payer: Medicare Other

## 2022-11-17 NOTE — Assessment & Plan Note (Signed)
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 -Due to biochemical relapse in 2017, he re-started ADT -Zytiga with prednisone started in August 2017, discontinued in October 2023 because of insurance coverage. -He was switched to Xtandi 160 mg daily in October 2023 -He has responded very well so far, PSA undetectable. -Last CT scan in May and December 2023 showed no evidence of metastasis.

## 2022-11-18 ENCOUNTER — Inpatient Hospital Stay: Payer: Medicare Other | Attending: Nurse Practitioner

## 2022-11-18 ENCOUNTER — Inpatient Hospital Stay: Payer: Medicare Other | Admitting: Hematology

## 2022-11-18 ENCOUNTER — Other Ambulatory Visit: Payer: Self-pay

## 2022-11-18 ENCOUNTER — Inpatient Hospital Stay: Payer: Medicare Other

## 2022-11-18 ENCOUNTER — Encounter: Payer: Self-pay | Admitting: Hematology

## 2022-11-18 VITALS — BP 145/90 | HR 87 | Temp 97.8°F | Resp 18 | Ht 67.0 in | Wt 204.4 lb

## 2022-11-18 DIAGNOSIS — E669 Obesity, unspecified: Secondary | ICD-10-CM

## 2022-11-18 DIAGNOSIS — C61 Malignant neoplasm of prostate: Secondary | ICD-10-CM

## 2022-11-18 DIAGNOSIS — Z5111 Encounter for antineoplastic chemotherapy: Secondary | ICD-10-CM | POA: Insufficient documentation

## 2022-11-18 LAB — CBC WITH DIFFERENTIAL (CANCER CENTER ONLY)
Abs Immature Granulocytes: 0.04 10*3/uL (ref 0.00–0.07)
Basophils Absolute: 0 10*3/uL (ref 0.0–0.1)
Basophils Relative: 1 %
Eosinophils Absolute: 0.2 10*3/uL (ref 0.0–0.5)
Eosinophils Relative: 3 %
HCT: 38.7 % — ABNORMAL LOW (ref 39.0–52.0)
Hemoglobin: 13.3 g/dL (ref 13.0–17.0)
Immature Granulocytes: 1 %
Lymphocytes Relative: 23 %
Lymphs Abs: 1.4 10*3/uL (ref 0.7–4.0)
MCH: 30.9 pg (ref 26.0–34.0)
MCHC: 34.4 g/dL (ref 30.0–36.0)
MCV: 90 fL (ref 80.0–100.0)
Monocytes Absolute: 0.9 10*3/uL (ref 0.1–1.0)
Monocytes Relative: 15 %
Neutro Abs: 3.5 10*3/uL (ref 1.7–7.7)
Neutrophils Relative %: 57 %
Platelet Count: 243 10*3/uL (ref 150–400)
RBC: 4.3 MIL/uL (ref 4.22–5.81)
RDW: 15.3 % (ref 11.5–15.5)
WBC Count: 6 10*3/uL (ref 4.0–10.5)
nRBC: 0 % (ref 0.0–0.2)

## 2022-11-18 LAB — CMP (CANCER CENTER ONLY)
ALT: 10 U/L (ref 0–44)
AST: 16 U/L (ref 15–41)
Albumin: 4.1 g/dL (ref 3.5–5.0)
Alkaline Phosphatase: 80 U/L (ref 38–126)
Anion gap: 8 (ref 5–15)
BUN: 13 mg/dL (ref 8–23)
CO2: 26 mmol/L (ref 22–32)
Calcium: 9.4 mg/dL (ref 8.9–10.3)
Chloride: 104 mmol/L (ref 98–111)
Creatinine: 0.96 mg/dL (ref 0.61–1.24)
GFR, Estimated: >60 mL/min (ref >60)
Glucose, Bld: 109 mg/dL — ABNORMAL HIGH (ref 70–99)
Potassium: 4.8 mmol/L (ref 3.5–5.1)
Sodium: 138 mmol/L (ref 135–145)
Total Bilirubin: 0.7 mg/dL (ref 0.3–1.2)
Total Protein: 6.7 g/dL (ref 6.5–8.1)

## 2022-11-18 MED ORDER — LEUPROLIDE ACETATE (3 MONTH) 22.5 MG ~~LOC~~ KIT
22.5000 mg | PACK | Freq: Once | SUBCUTANEOUS | Status: AC
  Administered 2022-11-18: 22.5 mg via SUBCUTANEOUS
  Filled 2022-11-18: qty 22.5

## 2022-11-18 MED ORDER — ENZALUTAMIDE 40 MG PO TABS: 160.0000 mg | ORAL_TABLET | Freq: Every day | ORAL | 2 refills | Status: DC

## 2022-11-18 NOTE — Patient Instructions (Signed)
Leuprolide Suspension for Injection (Prostate Cancer) What is this medication? LEUPROLIDE (loo PROE lide) reduces the symptoms of prostate cancer. It works by decreasing levels of the hormone testosterone in the body. This prevents prostate cancer cells from spreading or growing. This medicine may be used for other purposes; ask your health care provider or pharmacist if you have questions. COMMON BRAND NAME(S): Eligard, Lupron Depot, Lupron Depot-Ped, Lutrate Depot, Viadur What should I tell my care team before I take this medication? They need to know if you have any of these conditions: Diabetes Heart disease Heart failure High or low levels of electrolytes, such as magnesium, potassium, or sodium in your blood Irregular heartbeat or rhythm Seizures An unusual or allergic reaction to leuprolide, other medications, foods, dyes, or preservatives Pregnant or trying to get pregnant Breast-feeding How should I use this medication? This medication is injected under the skin or into a muscle. It is given by your care team in a hospital or clinic setting. Talk to your care team about the use of this medication in children. Special care may be needed. Overdosage: If you think you have taken too much of this medicine contact a poison control center or emergency room at once. NOTE: This medicine is only for you. Do not share this medicine with others. What if I miss a dose? Keep appointments for follow-up doses. It is important not to miss your dose. Call your care team if you are unable to keep an appointment. What may interact with this medication? Do not take this medication with any of the following: Cisapride Dronedarone Ketoconazole Levoketoconazole Pimozide Thioridazine This medication may also interact with the following: Other medications that cause heart rhythm changes This list may not describe all possible interactions. Give your health care provider a list of all the medicines,  herbs, non-prescription drugs, or dietary supplements you use. Also tell them if you smoke, drink alcohol, or use illegal drugs. Some items may interact with your medicine. What should I watch for while using this medication? Visit your care team for regular checks on your progress. Tell your care team if your symptoms do not start to get better or if they get worse. This medication may increase blood sugar. The risk may be higher in patients who already have diabetes. Ask your care team what you can do to lower the risk of diabetes while taking this medication. This medication may cause infertility. Talk to your care team if you are concerned about your fertility. Heart attacks and strokes have been reported with the use of this medication. Get emergency help if you develop signs or symptoms of a heart attack or stroke. Talk to your care team about the risks and benefits of this medication. What side effects may I notice from receiving this medication? Side effects that you should report to your care team as soon as possible: Allergic reactions--skin rash, itching, hives, swelling of the face, lips, tongue, or throat Heart attack--pain or tightness in the chest, shoulders, arms, or jaw, nausea, shortness of breath, cold or clammy skin, feeling faint or lightheaded Heart rhythm changes--fast or irregular heartbeat, dizziness, feeling faint or lightheaded, chest pain, trouble breathing High blood sugar (hyperglycemia)--increased thirst or amount of urine, unusual weakness or fatigue, blurry vision Mood swings, irritability, hostility Seizures Stroke--sudden numbness or weakness of the face, arm, or leg, trouble speaking, confusion, trouble walking, loss of balance or coordination, dizziness, severe headache, change in vision Thoughts of suicide or self-harm, worsening mood, feelings of depression Side  effects that usually do not require medical attention (report to your care team if they continue or  are bothersome): Bone pain Change in sex drive or performance General discomfort and fatigue Hot flashes Muscle pain Pain, redness, or irritation at injection site Swelling of the ankles, hands, or feet This list may not describe all possible side effects. Call your doctor for medical advice about side effects. You may report side effects to FDA at 1-800-FDA-1088. Where should I keep my medication? This medication is given in a hospital or clinic. It will not be stored at home. NOTE: This sheet is a summary. It may not cover all possible information. If you have questions about this medicine, talk to your doctor, pharmacist, or health care provider.  2023 Elsevier/Gold Standard (2021-11-10 00:00:00)

## 2022-11-18 NOTE — Progress Notes (Signed)
Tony [name]   [phone] Fax:([phone]) [phone]   Clinic Follow up Note   Patient Care Team: [name], [name], MD as PCP - General (Family Medicine) Croitoru, Dani Gobble, MD as PCP - Cardiology (Cardiology) Viona Gilmore, [facility] (Inactive) as Pharmacist (Pharmacist) Truitt Merle, MD as Attending Physician (Hematology and Oncology)  Date of Service:  11/18/2022  CHIEF COMPLAINT: f/u of Prostate Cancer  CURRENT THERAPY:  Eligard 22.5 mg every 3 months.    Xtandi 160 mg daily started on June 15, 2022.     ASSESSMENT:  Tony [name] is a 82 y.o. male with   Malignant neoplasm of prostate ([place]) -initially [id number] diagnosed in 2015, with Gleason score 4+5 = 9 and a PSA 15.7, biochemical relapse in 2017  -I reviewed his chart, including notes, lab and images extensively and confirmed the key Burr Medico  needs with patient and his wife.   -status post radiation therapy with external beam and seed implant brachytherapy boost completed in October 2015 -He received 1 year androgen deprivation in 2016. -Due to biochemical relapse in 2017, he re-started ADT -Zytiga with prednisone started in August 2017, discontinued in October 2023 because of insurance coverage. -He was switched to Xtandi 160 mg daily in October 2023 -He has responded very well so far, PSA undetectable.  Will continue current therapy. -We discussed other treatment options when he has disease progression, such as chemotherapy, Pluvicto etc.  -Last CT scan in May and December 2023 showed no evidence of metastasis.    PLAN: -lab reviewed, PSA- pending -referral for genetic testing.due to personal and family history of prostate cancer  -Will proceed Avagard injection today and continue every 3 months -lab,f/u in 3 months   INTERVAL HISTORY:  Tony [name] is here for a follow up of Prostate Cancer He was last seen by Dr.Shadad on 07/17/2022 He presents to the clinic accompanied by wife. Pt state  he feels great ,denies having any pain. Pt does nit have any side effects from the [place]. Pt state that he as some leg swelling. Pt reports he is on a C-pap machine at night. Pt state he is on Oxygen at night.     All other systems were reviewed with the patient and are negative.  MEDICAL HISTORY:  Past Medical History:  Diagnosis Date   Anxiety    Arthritis    COPD (chronic obstructive pulmonary disease) ([place])    Coronary artery disease    Dyspnea    Gastric outlet obstruction 02/03/2018   [name] 02/03/2018   GERD (gastroesophageal reflux disease)    History of hiatal hernia    small/notes 02/03/2018   Hypertension    no meds   Presence of permanent cardiac pacemaker    Prostate cancer ([place]) 02/08/2014   Gleason 4+5=9, PSA 15.65   Radiation    Sinus problem    SSS (sick sinus syndrome) ([place])     SURGICAL HISTORY: Past Surgical History:  Procedure Laterality Date   APPENDECTOMY  2019   hx hematoma s/p appendectomy   CHOLECYSTECTOMY N/A 05/15/2022   Procedure: LAPAROSCOPIC CHOLECYSTECTOMY;  Surgeon: [name], MD;  Location: [facility];  Service: General;  Laterality: N/A;   ESOPHAGOGASTRODUODENOSCOPY (EGD) WITH PROPOFOL N/A 02/03/2018   Procedure: ESOPHAGOGASTRODUODENOSCOPY (EGD) WITH PROPOFOL;  Surgeon: Clarene Essex, MD;  Location: [place];  Service: Endoscopy;  Laterality: N/A;   FRACTURE SURGERY     INCISIONAL HERNIA REPAIR N/A 10/14/2022   Procedure: INCISIONAL HERNIA REPAIR WITH MESH;  Surgeon:  [name], MD;  [facility]: [facility];  Service: General;  Laterality: N/A;   INSERT / REPLACE / REMOVE PACEMAKER  06/14/2018   LAPAROSCOPIC APPENDECTOMY N/A 01/21/2018   Procedure: APPENDECTOMY LAPAROSCOPIC;  Surgeon: Kieth Brightly Arta Bruce, MD;  Location: [place];  Service: General;  Laterality: N/A;   PACEMAKER IMPLANT N/A 06/14/2018   Procedure: PACEMAKER IMPLANT - Dual Chamber;  Surgeon: Sanda Klein, MD;  Location: [place] [facility];  Service: Cardiovascular;   Laterality: N/A;   PROSTATE BIOPSY  02/08/14   gleason 4+5=9, 12/12 cores positive, 54 gm   RADIOACTIVE SEED IMPLANT N/A 06/29/2014   Procedure: RADIOACTIVE SEED IMPLANT;  Surgeon: Bernestine Amass, MD;  [facility];  Service: Urology;  Laterality: N/A;   WRIST FRACTURE SURGERY Right 1980s    I have reviewed the social history and family history with the patient and they are unchanged from previous note.  ALLERGIES:  is allergic to penicillins.  MEDICATIONS:  Current Outpatient Medications  Medication Sig Dispense Refill   acetaminophen (TYLENOL) 500 MG tablet Take 2 tablets (1,000 mg total) by mouth 3 (three) times daily. 30 tablet 0   albuterol (VENTOLIN HFA) 108 (90 Base) MCG/ACT inhaler TAKE 2 PUFFS BY MOUTH EVERY 6 HOURS AS NEEDED FOR WHEEZE OR SHORTNESS OF BREATH 8.5 each 12   ALPRAZolam (XANAX) 1 MG tablet Take 0.5 tablets (0.5 mg total) by mouth 2 (two) times daily as needed for anxiety. 30 tablet 2   aspirin EC 81 MG tablet Take 81 mg by mouth daily.     atorvastatin (LIPITOR) 20 MG tablet TAKE 1 TABLET BY MOUTH DAILY 90 tablet 3   benzonatate (TESSALON) 100 MG capsule TAKE 1 CAPSULE BY MOUTH EVERY DAY AS NEEDED 30 capsule 1   citalopram (CELEXA) 10 MG tablet Take 1 tablet (10 mg total) by mouth daily. 90 tablet 3   diclofenac Sodium (VOLTAREN) 1 % GEL AAPLY 4 GRAMS TOPICALLY 4 TIMES DAILY (Patient taking differently: Apply 1 Application topically 4 (four) times daily as needed (pain).) 400 g 2   docusate sodium (COLACE) 100 MG capsule Take 1 capsule (100 mg total) by mouth 2 (two) times daily. 10 capsule 0   enzalutamide (XTANDI) 40 MG tablet Take 4 tablets (160 mg total) by mouth daily. 120 tablet 2   fluticasone (FLONASE) 50 MCG/ACT nasal spray PLACE 1 SPRAY INTO BOTH NOSTRILS DAILY AS NEEDED FOR ALLERGIES OR RHINITIS (AT BEDTIME). 48 mL 1   furosemide (LASIX) 20 MG tablet TAKE 1 TABLET BY MOUTH DAILY 90 tablet 3   gabapentin (NEURONTIN) 300 MG capsule  Take 1 capsule (300 mg total) by mouth at bedtime. 30 capsule 2   magnesium hydroxide (MILK OF MAGNESIA) 400 MG/5ML suspension Take 30 mLs by mouth daily as needed for mild constipation.     Melatonin 10 MG CAPS Take 20 mg by mouth at bedtime.     metoprolol tartrate (LOPRESSOR) 25 MG tablet TAKE 1 TABLET BY MOUTH TWICE A DAY 180 tablet 3   Multiple Vitamin (MULTIVITAMIN WITH MINERALS) TABS tablet Take 0.5 tablets by mouth daily.     omeprazole (PRILOSEC) 20 MG capsule TAKE 1 CAPSULE BY MOUTH DAILY BEFORE BREAKFAST. 90 capsule 3   Tiotropium Bromide Monohydrate (SPIRIVA RESPIMAT) 2.5 MCG/ACT AERS Inhale 2 puffs into the lungs daily. 4 g 5   No current facility-administered medications for this visit.    PHYSICAL EXAMINATION: ECOG PERFORMANCE STATUS: 1 - Symptomatic but completely ambulatory  Vitals:   11/18/22 1304  BP: (!) 145/90  Pulse: 87  Resp: 18  Temp: 97.8 F (36.6 C)  SpO2: 94%   Wt Readings from Last 3 Encounters:  11/18/22 204 lb 6.4 oz (92.7 kg)  10/26/22 201 lb 6 oz (91.3 kg)  10/14/22 191 lb 6 oz (86.8 kg)     NECK: (-)supple, thyroid normal size, non-tender, without nodularity LYMPH:(-)  no palpable lymphadenopathy in the cervical, axillary  LUNGS:(-)  clear to auscultation and percussion with normal breathing effort HEART:(-)  regular rate & rhythm and no murmurs and no lower extremity edema ABDOMEN:abdomen soft, non-tender and normal bowel sounds ,small incision around the midline.   LABORATORY DATA:  I have reviewed the data as listed    Latest Ref Rng & Units 11/18/2022   12:44 PM 10/08/2022   10:44 AM 08/14/2022    7:01 PM  CBC  WBC 4.0 - 10.5 K/uL 6.0  5.6  5.4   Hemoglobin 13.0 - 17.0 g/dL 13.3  13.8  13.3   Hematocrit 39.0 - 52.0 % 38.7  42.1  40.1   Platelets 150 - 400 K/uL 243  194  217         Latest Ref Rng & Units 11/18/2022   12:44 PM 10/08/2022   10:44 AM 08/14/2022    7:01 PM  CMP  Glucose 70 - 99 mg/dL 109  116  112   BUN 8 - 23 mg/dL  '13  17  18   '$ Creatinine 0.61 - 1.24 mg/dL 0.96  0.90  0.93   Sodium 135 - 145 mmol/L 138  137  135   Potassium 3.5 - 5.1 mmol/L 4.8  4.6  3.9   Chloride 98 - 111 mmol/L 104  104  101   CO2 22 - 32 mmol/L '26  23  25   '$ Calcium 8.9 - 10.3 mg/dL 9.4  9.2  9.2   Total Protein 6.5 - 8.1 g/dL 6.7     Total Bilirubin 0.3 - 1.2 mg/dL 0.7     Alkaline Phos 38 - 126 U/L 80     AST 15 - 41 U/L 16     ALT 0 - 44 U/L 10         RADIOGRAPHIC STUDIES: I have personally reviewed the radiological images as listed and agreed with the findings in the report. No results found.    Orders Placed This Encounter  Procedures   Ambulatory referral to Genetics    Referral Priority:   Routine    Referral Type:   Consultation    Referral Reason:   Specialty Services Required    Number of Visits Requested:   1   All questions were answered. The patient knows to call the clinic with any problems, questions or concerns. No barriers to learning was detected. The total time spent in the appointment was 40 minutes.     Truitt Merle, MD 11/18/2022   Felicity Coyer, CMA, am acting as scribe for Truitt Merle, MD.   I have reviewed the above documentation for accuracy and completeness, and I agree with the above.

## 2022-11-19 DIAGNOSIS — H269 Unspecified cataract: Secondary | ICD-10-CM | POA: Diagnosis not present

## 2022-11-19 DIAGNOSIS — H25812 Combined forms of age-related cataract, left eye: Secondary | ICD-10-CM | POA: Diagnosis not present

## 2022-11-19 LAB — PROSTATE-SPECIFIC AG, SERUM (LABCORP): Prostate Specific Ag, Serum: <0.1 ng/mL (ref 0.0–4.0)

## 2022-11-21 ENCOUNTER — Encounter: Payer: Self-pay | Admitting: Hematology

## 2022-11-26 ENCOUNTER — Other Ambulatory Visit: Payer: Self-pay

## 2022-12-03 DIAGNOSIS — H269 Unspecified cataract: Secondary | ICD-10-CM | POA: Diagnosis not present

## 2022-12-03 DIAGNOSIS — H25811 Combined forms of age-related cataract, right eye: Secondary | ICD-10-CM | POA: Diagnosis not present

## 2022-12-10 DIAGNOSIS — H04123 Dry eye syndrome of bilateral lacrimal glands: Secondary | ICD-10-CM | POA: Diagnosis not present

## 2022-12-12 DIAGNOSIS — J9611 Chronic respiratory failure with hypoxia: Secondary | ICD-10-CM | POA: Diagnosis not present

## 2022-12-15 ENCOUNTER — Ambulatory Visit (INDEPENDENT_AMBULATORY_CARE_PROVIDER_SITE_OTHER): Payer: Medicare Other

## 2022-12-15 DIAGNOSIS — I495 Sick sinus syndrome: Secondary | ICD-10-CM

## 2022-12-15 LAB — CUP PACEART REMOTE DEVICE CHECK
Battery Remaining Longevity: 111 mo
Battery Voltage: 3 V
Brady Statistic AP VP Percent: 0.06 %
Brady Statistic AP VS Percent: 77.94 %
Brady Statistic AS VP Percent: 0.02 %
Brady Statistic AS VS Percent: 21.99 %
Brady Statistic RA Percent Paced: 77.03 %
Brady Statistic RV Percent Paced: 0.07 %
Date Time Interrogation Session: 20240401231609
Implantable Lead Connection Status: 753985
Implantable Lead Connection Status: 753985
Implantable Lead Implant Date: 20191001
Implantable Lead Implant Date: 20191001
Implantable Lead Location: 753859
Implantable Lead Location: 753860
Implantable Lead Model: 5076
Implantable Lead Model: 5076
Implantable Pulse Generator Implant Date: 20191001
Lead Channel Impedance Value: 304 Ohm
Lead Channel Impedance Value: 304 Ohm
Lead Channel Impedance Value: 380 Ohm
Lead Channel Impedance Value: 513 Ohm
Lead Channel Pacing Threshold Amplitude: 0.375 V
Lead Channel Pacing Threshold Amplitude: 0.75 V
Lead Channel Pacing Threshold Pulse Width: 0.4 ms
Lead Channel Pacing Threshold Pulse Width: 0.4 ms
Lead Channel Sensing Intrinsic Amplitude: 2.625 mV
Lead Channel Sensing Intrinsic Amplitude: 2.625 mV
Lead Channel Sensing Intrinsic Amplitude: 3.5 mV
Lead Channel Sensing Intrinsic Amplitude: 3.5 mV
Lead Channel Setting Pacing Amplitude: 1.5 V
Lead Channel Setting Pacing Amplitude: 2.5 V
Lead Channel Setting Pacing Pulse Width: 0.4 ms
Lead Channel Setting Sensing Sensitivity: 0.9 mV
Zone Setting Status: 755011
Zone Setting Status: 755011

## 2022-12-17 ENCOUNTER — Inpatient Hospital Stay (HOSPITAL_BASED_OUTPATIENT_CLINIC_OR_DEPARTMENT_OTHER): Payer: Medicare Other | Admitting: Genetic Counselor

## 2022-12-17 ENCOUNTER — Encounter: Payer: Self-pay | Admitting: Genetic Counselor

## 2022-12-17 ENCOUNTER — Inpatient Hospital Stay: Payer: Medicare Other | Attending: Hematology

## 2022-12-17 ENCOUNTER — Other Ambulatory Visit: Payer: Self-pay | Admitting: Genetic Counselor

## 2022-12-17 ENCOUNTER — Other Ambulatory Visit: Payer: Self-pay

## 2022-12-17 DIAGNOSIS — Z8042 Family history of malignant neoplasm of prostate: Secondary | ICD-10-CM | POA: Insufficient documentation

## 2022-12-17 DIAGNOSIS — C61 Malignant neoplasm of prostate: Secondary | ICD-10-CM | POA: Diagnosis present

## 2022-12-17 DIAGNOSIS — Z1379 Encounter for other screening for genetic and chromosomal anomalies: Secondary | ICD-10-CM

## 2022-12-17 LAB — CBC WITH DIFFERENTIAL (CANCER CENTER ONLY)
Abs Immature Granulocytes: 0.02 10*3/uL (ref 0.00–0.07)
Basophils Absolute: 0 10*3/uL (ref 0.0–0.1)
Basophils Relative: 1 %
Eosinophils Absolute: 0.2 10*3/uL (ref 0.0–0.5)
Eosinophils Relative: 4 %
HCT: 38.3 % — ABNORMAL LOW (ref 39.0–52.0)
Hemoglobin: 12.8 g/dL — ABNORMAL LOW (ref 13.0–17.0)
Immature Granulocytes: 0 %
Lymphocytes Relative: 23 %
Lymphs Abs: 1.2 10*3/uL (ref 0.7–4.0)
MCH: 30 pg (ref 26.0–34.0)
MCHC: 33.4 g/dL (ref 30.0–36.0)
MCV: 89.7 fL (ref 80.0–100.0)
Monocytes Absolute: 0.7 10*3/uL (ref 0.1–1.0)
Monocytes Relative: 13 %
Neutro Abs: 3.1 10*3/uL (ref 1.7–7.7)
Neutrophils Relative %: 59 %
Platelet Count: 229 10*3/uL (ref 150–400)
RBC: 4.27 MIL/uL (ref 4.22–5.81)
RDW: 13.9 % (ref 11.5–15.5)
WBC Count: 5.2 10*3/uL (ref 4.0–10.5)
nRBC: 0 % (ref 0.0–0.2)

## 2022-12-17 LAB — CMP (CANCER CENTER ONLY)
ALT: 8 U/L (ref 0–44)
AST: 13 U/L — ABNORMAL LOW (ref 15–41)
Albumin: 4 g/dL (ref 3.5–5.0)
Alkaline Phosphatase: 82 U/L (ref 38–126)
Anion gap: 7 (ref 5–15)
BUN: 22 mg/dL (ref 8–23)
CO2: 25 mmol/L (ref 22–32)
Calcium: 9.4 mg/dL (ref 8.9–10.3)
Chloride: 106 mmol/L (ref 98–111)
Creatinine: 0.89 mg/dL (ref 0.61–1.24)
GFR, Estimated: >60 mL/min (ref >60)
Glucose, Bld: 103 mg/dL — ABNORMAL HIGH (ref 70–99)
Potassium: 4.2 mmol/L (ref 3.5–5.1)
Sodium: 138 mmol/L (ref 135–145)
Total Bilirubin: 0.5 mg/dL (ref 0.3–1.2)
Total Protein: 6.6 g/dL (ref 6.5–8.1)

## 2022-12-17 LAB — GENETIC SCREENING ORDER

## 2022-12-17 NOTE — Progress Notes (Signed)
REFERRING PROVIDER: Truitt Merle, MD  PRIMARY PROVIDER:  [name], [name], MD  PRIMARY REASON FOR VISIT:  Encounter Diagnoses  Name Primary?   Malignant neoplasm of prostate Yes   Family history of prostate cancer    HISTORY OF PRESENT ILLNESS:   Tony [name], a 82 y.o. male, was seen for a [place] cancer genetics consultation at the request of Dr. Burr Medico due to a personal and family history of cancer.  Tony [name] presents to clinic today to discuss the possibility of a hereditary predisposition to cancer, to discuss genetic testing, and to further clarify his future cancer risks, as well as potential cancer risks for family members.   CANCER HISTORY:  In 2015, at the age of 6, Tony [name] was diagnosed with high risk prostate cancer (Gleason 4+5=9).   Past Medical History:  Diagnosis Date   Anxiety    Arthritis    COPD (chronic obstructive pulmonary disease) ([place])    Coronary artery disease    Dyspnea    Gastric outlet obstruction 02/03/2018   [name] 02/03/2018   GERD (gastroesophageal reflux disease)    History of hiatal hernia    small/notes 02/03/2018   Hypertension    no meds   Presence of permanent cardiac pacemaker    Prostate cancer ([place]) 02/08/2014   Gleason 4+5=9, PSA 15.65   Radiation    Sinus problem    SSS (sick sinus syndrome) ([place])     Past Surgical History:  Procedure Laterality Date   APPENDECTOMY  2019   hx hematoma s/p appendectomy   CHOLECYSTECTOMY N/A 05/15/2022   Procedure: LAPAROSCOPIC CHOLECYSTECTOMY;  Surgeon: [name], MD;  [facility];  Service: General;  Laterality: N/A;   ESOPHAGOGASTRODUODENOSCOPY (EGD) WITH PROPOFOL N/A 02/03/2018   Procedure: ESOPHAGOGASTRODUODENOSCOPY (EGD) WITH PROPOFOL;  Surgeon: Clarene Essex, MD;  Location: [place];  Service: Endoscopy;  Laterality: N/A;   FRACTURE SURGERY     INCISIONAL HERNIA REPAIR N/A 10/14/2022   Procedure: INCISIONAL HERNIA REPAIR WITH MESH;  Surgeon: [name], MD;  [facility];  Service: General;  Laterality: N/A;   INSERT / REPLACE / REMOVE PACEMAKER  06/14/2018   LAPAROSCOPIC APPENDECTOMY N/A 01/21/2018   Procedure: APPENDECTOMY LAPAROSCOPIC;  Surgeon: Mickeal Skinner, MD;  Location: [place];  Service: General;  Laterality: N/A;   PACEMAKER IMPLANT N/A 06/14/2018   Procedure: PACEMAKER IMPLANT - Dual Chamber;  Surgeon: Sanda Klein, MD;  Location: [facility];  Service: Cardiovascular;  Laterality: N/A;   PROSTATE BIOPSY  02/08/14   gleason 4+5=9, 12/12 cores positive, 54 gm   RADIOACTIVE SEED IMPLANT N/A 06/29/2014   Procedure: RADIOACTIVE SEED IMPLANT;  Surgeon: Bernestine Amass, MD;  Location: [facility];  Service: Urology;  Laterality: N/A;   WRIST FRACTURE SURGERY Right 1980s    Social History   Socioeconomic History   Marital status: Married    Spouse name: Not on file   Number of children: 3   Years of education: Not on file   Highest education level: Not on file  Occupational History   Occupation: Freight forwarder    Employer: [employer]    Comment: retired   Occupation: Freight forwarder    Comment: [employer]-[place]  Tobacco Use   Smoking status: Former    Types: Cigars    Quit date: 09/14/2012    Years since quitting: 10.2   Smokeless tobacco: Never   Tobacco comments:    little cigars; the small ones"  Vaping Use  Vaping Use: Never used  Substance and Sexual Activity   Alcohol use: Not Currently    Alcohol/week: 14.0 standard drinks of alcohol    Types: 14 Cans of beer per week    Comment: quit in 2000   Drug use: No   Sexual activity: Not Currently  Other Topics Concern   Not on file  Social History Narrative   Lives with wife and 2 dogs in one level home; daughter and her family co-habitate.   Has three daughters, all in [place], supportive. Five grandchildren.   Wants to return to doing silver sneakers once gym re-opens.         Social Determinants of Health   Financial Resource Strain: Low Risk   (07/29/2022)   Overall Financial Resource Strain (CARDIA)    Difficulty of Paying Living Expenses: Not hard at all  Recent Concern: Financial Resource Strain - Medium Risk (05/27/2022)   Overall Financial Resource Strain (CARDIA)    Difficulty of Paying Living Expenses: Somewhat hard  Food Insecurity: No Food Insecurity (10/15/2022)   Hunger Vital Sign    Worried About Running Out of Food in the Last Year: Never true    [place] in the Last Year: Never true  Transportation Needs: No Transportation Needs (10/15/2022)   PRAPARE - Hydrologist (Medical): No    Lack of Transportation (Non-Medical): No  Physical Activity: Insufficiently Active (07/29/2022)   Exercise Vital Sign    Days of Exercise per Week: 5 days    Minutes of Exercise per Session: 20 min  Stress: No Stress Concern Present (07/29/2022)   [place]    Feeling of Stress : Not at all  Social Connections: [place] (07/29/2022)   Social Connection and Isolation Panel [NHANES]    Frequency of Communication with Friends and Family: More than three times a week    Frequency of Social Gatherings with Friends and Family: More than three times a week    Attends Religious Services: More than 4 times per year    Active Member of [employer] or Organizations: Yes    Attends Music therapist: More than 4 times per year    Marital Status: Married     FAMILY HISTORY:  We obtained a detailed, 4-generation family history.  Significant diagnoses are listed below: Family History  Problem Relation Age of Onset   Alzheimer's disease Mother    Alzheimer's disease Father    Prostate cancer Father    Arthritis Sister    Prostate cancer Paternal Grandfather    Prostate cancer Brother    Stomach cancer Brother    Lung cancer Brother    Prostate cancer Brother       Tony [name] has 8 brothers. Two brothers have a history  of prostate cancer diagnosed at unknown ages, one is deceased and one is living. One of his brothers was diagnosed with lung cancer and another brother was diagnosed with stomach cancer, they are both deceased. His father was diagnosed with prostate cancer in his 30s, he died at age 76. His paternal grandfather was diagnosed with prostate cancer at an unknown age, he is deceased. Tony [name] is unaware of previous family history of genetic testing for hereditary cancer risks. There is no reported Ashkenazi Jewish ancestry.   GENETIC COUNSELING ASSESSMENT: Tony [name] is a 82 y.o. male with a personal and family history of cancer which is somewhat suggestive of a  hereditary predisposition to cancer given his personal history of high risk prostate cancer. We, therefore, discussed and recommended the following at today's visit.   DISCUSSION: We discussed that 5 - 10% of cancer is hereditary, with most cases of prostate cancer associated with BRCA1/2.  There are other genes that can be associated with hereditary prostate cancer syndromes.  We discussed that testing is beneficial for several reasons, including knowing about other cancer risks, identifying potential screening and risk-reduction options that may be appropriate, and to understanding if other family members could be at risk for cancer and allowing them to undergo genetic testing.   We reviewed the characteristics, features and inheritance patterns of hereditary cancer syndromes. We also discussed genetic testing, including the appropriate family members to test, the process of testing, insurance coverage and turn-around-time for results. We discussed the implications of a negative, positive, carrier and/or variant of uncertain significant result. We recommended Tony [name] pursue genetic testing for a panel that includes genes associated with prostate and stomach cancer.   Tony [name] was offered a common hereditary cancer panel (48 genes) and an  expanded pan-cancer panel (70 genes). Tony [name] was informed of the benefits and limitations of each panel, including that expanded pan-cancer panels contain genes that do not have clear management guidelines at this point in time.  We also discussed that as the number of genes included on a panel increases, the chances of variants of uncertain significance increases. After considering the benefits and limitations of each gene panel, Tony [name] elected to have [facility] Multi-Cancer Panel.  The Multi-Cancer + RNA Panel offered by [facility] includes sequencing and/or deletion/duplication analysis of the following 70 genes:  AIP*, ALK, APC*, ATM*, AXIN2*, BAP1*, BARD1*, BLM*, BMPR1A*, BRCA1*, BRCA2*, BRIP1*, CDC73*, CDH1*, CDK4, CDKN1B*, CDKN2A, CHEK2*, CTNNA1*, DICER1*, EPCAM (del/dup only), EGFR, FH*, FLCN*, GREM1 (promoter dup only), HOXB13, KIT, LZTR1, MAX*, MBD4, MEN1*, MET, MITF, MLH1*, MSH2*, MSH3*, MSH6*, MUTYH*, NF1*, NF2*, NTHL1*, PALB2*, PDGFRA, PMS2*, POLD1*, POLE*, POT1*, PRKAR1A*, PTCH1*, PTEN*, RAD51C*, RAD51D*, RB1*, RET, SDHA* (sequencing only), SDHAF2*, SDHB*, SDHC*, SDHD*, SMAD4*, SMARCA4*, SMARCB1*, SMARCE1*, STK11*, SUFU*, TMEM127*, TP53*, TSC1*, TSC2*, VHL*. RNA analysis is performed for * genes.  Based on Tony [name]'s personal and family history of cancer, he meets medical criteria for genetic testing. Despite that he meets criteria, he may still have an out of pocket cost. We discussed that if his out of pocket cost for testing is over $100, the laboratory will call and confirm whether he wants to proceed with testing.  If the out of pocket cost of testing is less than $100 he will be billed by the genetic testing laboratory.   PLAN: After considering the risks, benefits, and limitations, Tony [name] provided informed consent to pursue genetic testing and the blood sample was sent to [facility] for analysis of the Multi-Cancer Panel. Results should be available within  approximately 2-3 weeks' time, at which point they will be disclosed by telephone to Mr. Humble, as will any additional recommendations warranted by these results. Mr. Stueber will receive a summary of his genetic counseling visit and a copy of his results once available. This information will also be available in Epic.   Mr. Savoca's questions were answered to his satisfaction today. Our contact information was provided should additional questions or concerns arise. Thank you for the referral and allowing [place] to share in the care of your patient.   Lucille Passy, MS, [facility] Genetic Counselor [email or web address] (P) [phone]  The patient was seen for  a total of 25 minutes in face-to-face genetic counseling.  The patient brought his wife. Drs. Lindi Adie and/or Burr Medico were available to discuss this case as needed.   _______________________________________________________________________ For Office Staff:  Number of people involved in session: 2 Was an Intern/ student involved with case: no

## 2022-12-18 LAB — PROSTATE-SPECIFIC AG, SERUM (LABCORP): Prostate Specific Ag, Serum: <0.1 ng/mL (ref 0.0–4.0)

## 2022-12-20 ENCOUNTER — Other Ambulatory Visit: Payer: Self-pay | Admitting: Family Medicine

## 2022-12-20 DIAGNOSIS — F064 Anxiety disorder due to known physiological condition: Secondary | ICD-10-CM

## 2022-12-22 ENCOUNTER — Telehealth: Payer: Self-pay

## 2022-12-22 NOTE — Progress Notes (Signed)
Patient ID: Tony [name], male   DOB: 1941/05/02, 82 y.o.   MRN: [id number]  Care Management & Coordination Services Pharmacy Team  Reason for Encounter: COPD  Contacted patient to discuss COPD disease state. Spoke with family [name] on 12/22/2022   Current COPD regimen:  : Albuterol 108 mcg/act 2 puff every 6 hours as needed  Spiriva 1 puff then rinse , every morning and every evening.     No data to display         Any recent hospitalizations or ED visits since last visit with CPP? Yes Per wife denies COPD symptoms Have you had exacerbation/flare-up since last visit? No What do you do when you are short of breath?  Adhere to COPD Action Plan Rescue Inhaler  Respiratory Devices/Equipment Do you have a nebulizer? No Do you use a maintenance inhaler? Yes How often do you forget to use your daily inhaler? Not often per wife Do you use a rescue inhaler? Yes How often do you use your rescue inhaler?  prn  Wife reports he has been doing well with the inhalers no issues or concerns and has enough to get him through the year. She reports he is to follow up with PCP in a few days.  Adherence Review: Does the patient have >5 day gap between last estimated fill date for maintenance inhaler medications? No  Chart Review:  Recent office visits:  10/26/22 [place], [name], MD - Patient presented for Chronic cough and other concerns. Prescribed Gabapentin. Decreased Alprazolam.   10/12/22 [place], [name], MD - Patient presented for Essential hypertension benign and other concerns. No medication changes.   07/29/22 Tillie Rung, LPN - Patient presented via phone for Medicare Annual Wellness exam. No medication changes.  07/13/22 [place], [name], MD - Patient presented for Edema of left lower extremity and other concerns. No medication changes.   Recent consult visits:  12/17/22 Flippin, Jerold Coombe, Counselor (Oncology) - Patient presented for Genetic counseling for malignant  neoplasm of prostate. No medication changes.   11/18/22 Malachy Mood, MD (Oncology) - Patient presented for Malignant neoplasm of prostate and Infusion.  11/02/22 Jeoffrey Massed, MD  - Patient presented to [facility] for Hernia repair follow up and other concerns. No medication changes.  09/23/22 Bunnie Domino (Cardiology) - Patient presented for Pre-Op Clearance. No medication changes.   09/16/22 Jeoffrey Massed, MD  - Patient presented to [facility] for incisional hernia and other concerns. No medication changes.  08/20/22  Jeoffrey Massed, MD  - Patient presented to [facility] for incisional hernia and other concerns. No medication changes.  08/20/22 Waymon Budge, MD (Pulmonology) - Patient presented for OSA and other concerns. No medication changes.   07/17/22 Benjiman Core, MD (Oncology) - Patient presented for Malignant neoplasm of prostate. No medication changes.   Hospital visits:  Medication Reconciliation was completed by comparing discharge summary, patient's EMR and Pharmacy list, and upon discussion with patient.  Patient presented to [facility] on 10/14/22 due to Incisional hernia without obstruction of gangrene.Patient was present for 29 hours   New?Medications Started at [facility] Discharge:?? -started  acetaminophen (TYLENOL) docusate sodium (COLACE) traMADol (ULTRAM)  Medication Changes at Hospital Discharge: -Changed  none  Medications Discontinued at Hospital Discharge: -Stopped  none  Medications that remain the same after Hospital Discharge:??  -All other medications will remain the same.     Patient presented to [facility] ED at  [facility] on 08/14/22 for Umbilical hernia without obstruction and without gangrene. Patient was present for 2 hours.  New?Medications Started at [facility] Discharge:?? -started  none  Medication Changes at Hospital Discharge: -Changed   none  Medications Discontinued at Hospital Discharge: -Stopped  none  Medications that remain the same after Hospital Discharge:??  -All other medications will remain the same.     Medications: Outpatient Encounter Medications as of 12/22/2022  Medication Sig   acetaminophen (TYLENOL) 500 MG tablet Take 2 tablets (1,000 mg total) by mouth 3 (three) times daily.   albuterol (VENTOLIN HFA) 108 (90 Base) MCG/ACT inhaler TAKE 2 PUFFS BY MOUTH EVERY 6 HOURS AS NEEDED FOR WHEEZE OR SHORTNESS OF BREATH   ALPRAZolam (XANAX) 1 MG tablet TAKE 1/2 TABLET TWICE DAILY AS NEEDED   aspirin EC 81 MG tablet Take 81 mg by mouth daily.   atorvastatin (LIPITOR) 20 MG tablet TAKE 1 TABLET BY MOUTH DAILY   benzonatate (TESSALON) 100 MG capsule TAKE 1 CAPSULE BY MOUTH EVERY DAY AS NEEDED   citalopram (CELEXA) 10 MG tablet Take 1 tablet (10 mg total) by mouth daily.   diclofenac Sodium (VOLTAREN) 1 % GEL AAPLY 4 GRAMS TOPICALLY 4 TIMES DAILY (Patient taking differently: Apply 1 Application topically 4 (four) times daily as needed (pain).)   docusate sodium (COLACE) 100 MG capsule Take 1 capsule (100 mg total) by mouth 2 (two) times daily.   enzalutamide (XTANDI) 40 MG tablet Take 4 tablets (160 mg total) by mouth daily.   fluticasone (FLONASE) 50 MCG/ACT nasal spray PLACE 1 SPRAY INTO BOTH NOSTRILS DAILY AS NEEDED FOR ALLERGIES OR RHINITIS (AT BEDTIME).   furosemide (LASIX) 20 MG tablet TAKE 1 TABLET BY MOUTH DAILY   gabapentin (NEURONTIN) 300 MG capsule Take 1 capsule (300 mg total) by mouth at bedtime.   magnesium hydroxide (MILK OF MAGNESIA) 400 MG/5ML suspension Take 30 mLs by mouth daily as needed for mild constipation.   Melatonin 10 MG CAPS Take 20 mg by mouth at bedtime.   metoprolol tartrate (LOPRESSOR) 25 MG tablet TAKE 1 TABLET BY MOUTH TWICE A DAY   Multiple Vitamin (MULTIVITAMIN WITH MINERALS) TABS tablet Take 0.5 tablets by mouth daily.   omeprazole (PRILOSEC) 20 MG capsule TAKE 1 CAPSULE BY  MOUTH DAILY BEFORE BREAKFAST.   Tiotropium Bromide Monohydrate (SPIRIVA RESPIMAT) 2.5 MCG/ACT AERS Inhale 2 puffs into the lungs daily.   No facility-administered encounter medications on file as of 12/22/2022.       Pamala Duffel CMA Clinical Pharmacist Assistant [phone]

## 2022-12-23 ENCOUNTER — Telehealth: Payer: Self-pay | Admitting: Internal Medicine

## 2022-12-23 ENCOUNTER — Other Ambulatory Visit: Payer: Self-pay

## 2022-12-23 DIAGNOSIS — G4733 Obstructive sleep apnea (adult) (pediatric): Secondary | ICD-10-CM

## 2022-12-23 DIAGNOSIS — J449 Chronic obstructive pulmonary disease, unspecified: Secondary | ICD-10-CM

## 2022-12-23 DIAGNOSIS — J9611 Chronic respiratory failure with hypoxia: Secondary | ICD-10-CM

## 2022-12-23 NOTE — Telephone Encounter (Signed)
Spoke with patient. He advises per [facility] needs to be renewed? I called [facility] they need chart notes discussing 02 is still needed and a updated prescription  Dr. Maple Hudson can you please advise?

## 2022-12-23 NOTE — Telephone Encounter (Signed)
[name] wife states patient needs authorization for oxygen and BIPAP machine. Patient uses [facility]. [name] phone number is [phone].

## 2022-12-23 NOTE — Telephone Encounter (Signed)
Notes have been faxed to [facility] and order has been placed to renew. NFN

## 2022-12-23 NOTE — Telephone Encounter (Signed)
Ok to send my last office noted from December

## 2022-12-24 NOTE — Progress Notes (Unsigned)
HPI: [name] is a 82 y.o. 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  Last seen on 10/26/22 Chronic cough, last visit Gabapentin 300 mg at bedtime was added. He reports that the medication has been helpful in improving sleep, allowing for approximately eight hours of sleep per night.  He also takes Alprazolam 1 mg 1/2 tab at bedtime to help him sleep. The cough has improved mildly. Still having sough but not having bad spells as he has reported in the past. COPD on Albuterol inh and Spiriva 2.5 mcg 2 puff daily. He follows with pulmonologist.  Today he is c/o episodes of dizziness lasting for about 30-40 minutes after taking taking his medications in the morning and at night. Dizziness started a couple of weeks ago.  HTN on Metoprolol Tartrate 25 mg bid. He is not checking BP at home. Negative for severe/frequent headache, visual changes, chest pain, worsening dyspnea, focal weakness, or worsening edema.  Lab Results  Component Value Date   CREATININE 0.89 12/17/2022   BUN 22 12/17/2022   NA 138 12/17/2022   K 4.2 12/17/2022   CL 106 12/17/2022   CO2 25 12/17/2022   Prostate cancer, recently established with new oncologist. His medication was changed because insurance coverage.He is taking medication an hour after the rest of his meds and no side effects so far. He is also on Avagard injections every three months.  He is not longer on Prednisone.  Lab Results  Component Value Date   WBC 5.2 12/17/2022   HGB 12.8 (L) 12/17/2022   HCT 38.3 (L) 12/17/2022   MCV 89.7 12/17/2022   PLT 229 12/17/2022   He mentions that he would like to have liver function test done after undergoing cholecystectomy in 05/2022 and incisional hernia repair in 09/2022. He had labs recently at his oncologist's office.  Lab Results   Component Value Date   ALT 8 12/17/2022   AST 13 (L) 12/17/2022   ALKPHOS 82 12/17/2022   BILITOT 0.5 12/17/2022   He also mentions easy bruising, specially on forearms with minimal or no trauma. He is on Aspirin 81 mg daily. Negative for gross hematuria,melena, blood in stool,gum/nose bleed.  Review of Systems  Constitutional:  Negative for chills and fever.  Respiratory:  Negative for shortness of breath and wheezing.   Gastrointestinal:  Negative for abdominal pain, nausea and vomiting.  Endocrine: Negative for cold intolerance and heat intolerance.  Musculoskeletal:  Positive for arthralgias, back pain and gait problem.  Neurological:  Negative for syncope and facial asymmetry.  Psychiatric/Behavioral:  Negative for confusion and hallucinations.   See other pertinent positives and negatives in HPI.  Current Outpatient Medications on File Prior to Visit  Medication Sig Dispense Refill   acetaminophen (TYLENOL) 500 MG tablet Take 2 tablets (1,000 mg total) by mouth 3 (three) times daily. 30 tablet 0   albuterol (VENTOLIN HFA) 108 (90 Base) MCG/ACT inhaler TAKE 2 PUFFS BY MOUTH EVERY 6 HOURS AS NEEDED FOR WHEEZE OR SHORTNESS OF BREATH 8.5 each 12   ALPRAZolam (XANAX) 1 MG tablet TAKE 1/2 TABLET TWICE DAILY AS NEEDED 30 tablet 2   aspirin EC 81 MG tablet Take 81 mg by mouth daily.     atorvastatin (LIPITOR) 20 MG tablet TAKE 1 TABLET BY MOUTH DAILY 90 tablet 3   benzonatate (TESSALON) 100 MG capsule TAKE 1 CAPSULE BY MOUTH  EVERY DAY AS NEEDED 30 capsule 1   citalopram (CELEXA) 10 MG tablet Take 1 tablet (10 mg total) by mouth daily. 90 tablet 3   diclofenac Sodium (VOLTAREN) 1 % GEL AAPLY 4 GRAMS TOPICALLY 4 TIMES DAILY (Patient taking differently: Apply 1 Application topically 4 (four) times daily as needed (pain).) 400 g 2   docusate sodium (COLACE) 100 MG capsule Take 1 capsule (100 mg total) by mouth 2 (two) times daily. 10 capsule 0   enzalutamide (XTANDI) 40 MG tablet Take 4  tablets (160 mg total) by mouth daily. 120 tablet 2   fluticasone (FLONASE) 50 MCG/ACT nasal spray PLACE 1 SPRAY INTO BOTH NOSTRILS DAILY AS NEEDED FOR ALLERGIES OR RHINITIS (AT BEDTIME). 48 mL 1   furosemide (LASIX) 20 MG tablet TAKE 1 TABLET BY MOUTH DAILY 90 tablet 3   magnesium hydroxide (MILK OF MAGNESIA) 400 MG/5ML suspension Take 30 mLs by mouth daily as needed for mild constipation.     Melatonin 10 MG CAPS Take 20 mg by mouth at bedtime.     Multiple Vitamin (MULTIVITAMIN WITH MINERALS) TABS tablet Take 0.5 tablets by mouth daily.     omeprazole (PRILOSEC) 20 MG capsule TAKE 1 CAPSULE BY MOUTH DAILY BEFORE BREAKFAST. 90 capsule 3   Tiotropium Bromide Monohydrate (SPIRIVA RESPIMAT) 2.5 MCG/ACT AERS Inhale 2 puffs into the lungs daily. 4 g 5   No current facility-administered medications on file prior to visit.   Past Medical History:  Diagnosis Date   Anxiety    Arthritis    COPD (chronic obstructive pulmonary disease)    Coronary artery disease    Dyspnea    Gastric outlet obstruction 02/03/2018   [name]/notes 02/03/2018   GERD (gastroesophageal reflux disease)    History of hiatal hernia    small/notes 02/03/2018   Hypertension    no meds   Presence of permanent cardiac pacemaker    Prostate cancer 02/08/2014   Gleason 4+5=9, PSA 15.65   Radiation    Sinus problem    SSS (sick sinus syndrome)    Allergies  Allergen Reactions   Penicillins Rash    Has patient had a PCN reaction causing immediate rash, facial/tongue/throat swelling, SOB or lightheadedness with hypotension: Yes Has patient had a PCN reaction causing severe rash involving mucus membranes or skin necrosis: No Has patient had a PCN reaction that required hospitalization: No Has patient had a PCN reaction occurring within the last 10 years: No If all of the above answers are "NO", then may proceed with Cephalosporin use.    Social History   Socioeconomic History   Marital status: Married    Spouse name: Not  on file   Number of children: 3   Years of education: Not on file   Highest education level: Not on file  Occupational History   Occupation: Estate agentorklift operator    Employer: [employer]    Comment: retired   Occupation: Production designer, theatre/television/filmmanager    Comment: [employer]-[place]ired  Tobacco Use   Smoking status: Former    Types: Cigars    Quit date: 09/14/2012    Years since quitting: 10.2   Smokeless tobacco: Never   Tobacco comments:    little cigars; the small ones"  Vaping Use   Vaping Use: Never used  Substance and Sexual Activity   Alcohol use: Not Currently    Alcohol/week: 14.0 standard drinks of alcohol    Types: 14 Cans of beer per week    Comment: quit in 2000   Drug  use: No   Sexual activity: Not Currently  Other Topics Concern   Not on file  Social History Narrative   Lives with wife and 2 dogs in one level home; daughter and her family co-habitate.   Has three daughters, all in [place], supportive. Five grandchildren.   Wants to return to doing silver sneakers once gym re-opens.         Social Determinants of Health   Financial Resource Strain: Low Risk  (07/29/2022)   Overall Financial Resource Strain (CARDIA)    Difficulty of Paying Living Expenses: Not hard at all  Recent Concern: Financial Resource Strain - Medium Risk (05/27/2022)   Overall Financial Resource Strain (CARDIA)    Difficulty of Paying Living Expenses: Somewhat hard  Food Insecurity: No Food Insecurity (10/15/2022)   Hunger Vital Sign    Worried About Running Out of Food in the Last Year: Never true    Ran Out of Food in the Last Year: Never true  Transportation Needs: No Transportation Needs (10/15/2022)   PRAPARE - Administrator, Civil Service (Medical): No    Lack of Transportation (Non-Medical): No  Physical Activity: Insufficiently Active (07/29/2022)   Exercise Vital Sign    Days of Exercise per Week: 5 days    Minutes of Exercise per Session: 20 min  Stress: No Stress Concern Present (07/29/2022)    [employer] of Occupational Health - Occupational Stress Questionnaire    Feeling of Stress : Not at all  Social Connections: Socially Integrated (07/29/2022)   Social Connection and Isolation Panel [NHANES]    Frequency of Communication with Friends and Family: More than three times a week    Frequency of Social Gatherings with Friends and Family: More than three times a week    Attends Religious Services: More than 4 times per year    Active Member of [employer] or Organizations: Yes    Attends Banker Meetings: More than 4 times per year    Marital Status: Married   Vitals:   12/25/22 0907  BP: 118/70  Pulse: (!) 53  Resp: 16  Temp: 98.5 F (36.9 C)  SpO2: 98%   Body mass index is 32.26 kg/m.  Physical Exam Vitals and nursing note reviewed.  Constitutional:      General: He is not in acute distress.    Appearance: He is well-developed and well-groomed.  HENT:     Head: Normocephalic and atraumatic.     Mouth/Throat:     Mouth: Mucous membranes are moist.     Dentition: Has dentures.  Eyes:     Conjunctiva/sclera: Conjunctivae normal.     Pupils: Pupils are equal, round, and reactive to light.  Cardiovascular:     Rate and Rhythm: Regular rhythm. Bradycardia present.     Heart sounds: No murmur heard.    Comments: DP pulses present. Varicose veins LE's. Pulmonary:     Effort: Pulmonary effort is normal. No respiratory distress.     Breath sounds: Normal breath sounds.  Musculoskeletal:     Right lower leg: 1+ Pitting Edema present.     Left lower leg: 1+ Pitting Edema present.  Skin:    General: Skin is warm.     Findings: Ecchymosis (Scattered on forearms) present. No erythema or rash.     Comments: Toenail mildly hypertrophic, no toenail onycholysis appreciated   Neurological:     Mental Status: He is alert and oriented to person, place, and time.     Comments:  Gait assisted by a cane.  Psychiatric:        Mood and Affect: Affect normal. Mood  is anxious.   ASSESSMENT AND PLAN:  Mr.Griselda was seen today for medical management of chronic issues.  Diagnoses and all orders for this visit:  Insomnia, unspecified type Assessment & Plan: Sleep has improved after adding gabapentin 300 mg at bedtime. Alprazolam 1 mg 1/2 tablet at bedtime also helps. No changes in current management. Adequate sleep hygiene, as possible (history of nocturia), recommended.  Chronic cough Assessment & Plan: He is reporting improvement of cough. Continue gabapentin 300 mg daily at bedtime. He follows with pulmonologist for COPD.  Orders: -     Gabapentin; Take 1 capsule (300 mg total) by mouth at bedtime.  Dispense: 30 capsule; Refill: 3  Dizziness We discussed possible etiologies. Happening after taking his meds bid, the only one he takes bid is Metoprolol. ? Orthostatic hypotension. Today Metoprolol Tartrate dose decreased. Continue adequate hydration. Instructed about warning signs.  Essential hypertension, benign Assessment & Plan: Orthostatic hypotension could be causing episodes of lightheadedness. Recommend decreasing dose of metoprolol titrate 25 mg from 1 tablet twice daily to 1/2 tablet twice daily. Monitor BP and HR regularly. Continue low-salt diet. Eye exam is current.  Orders: -     Metoprolol Tartrate; Take 0.5 tablets (12.5 mg total) by mouth 2 (two) times daily.  Dispense: 180 tablet; Refill: 3  Purpura senilis Assessment & Plan: Reassured. We discussed diagnosis and prognosis. Aspirin 81 mg daily can aggravate problem.  Return in about 6 months (around 06/26/2023) for chronic problems.  Jaxin Fulfer G. [place], MD  [facility]. [place] office.

## 2022-12-25 ENCOUNTER — Ambulatory Visit (INDEPENDENT_AMBULATORY_CARE_PROVIDER_SITE_OTHER): Payer: Medicare Other | Admitting: Family Medicine

## 2022-12-25 ENCOUNTER — Encounter: Payer: Self-pay | Admitting: Family Medicine

## 2022-12-25 VITALS — BP 118/70 | HR 53 | Temp 98.5°F | Resp 16 | Ht 67.0 in | Wt 206.0 lb

## 2022-12-25 DIAGNOSIS — R053 Chronic cough: Secondary | ICD-10-CM

## 2022-12-25 DIAGNOSIS — G4700 Insomnia, unspecified: Secondary | ICD-10-CM

## 2022-12-25 DIAGNOSIS — I10 Essential (primary) hypertension: Secondary | ICD-10-CM

## 2022-12-25 DIAGNOSIS — D692 Other nonthrombocytopenic purpura: Secondary | ICD-10-CM | POA: Diagnosis not present

## 2022-12-25 DIAGNOSIS — R42 Dizziness and giddiness: Secondary | ICD-10-CM | POA: Diagnosis not present

## 2022-12-25 MED ORDER — GABAPENTIN 300 MG PO CAPS: 300.0000 mg | ORAL_CAPSULE | Freq: Every day | ORAL | 3 refills | Status: DC

## 2022-12-25 MED ORDER — METOPROLOL TARTRATE 25 MG PO TABS: 12.5000 mg | ORAL_TABLET | Freq: Two times a day (BID) | ORAL | 3 refills | Status: DC

## 2022-12-25 NOTE — Assessment & Plan Note (Signed)
Sleep has improved after adding gabapentin 300 mg at bedtime. Alprazolam 1 mg 1/2 tablet at bedtime also helps. No changes in current management. Adequate sleep hygiene, as possible (history of nocturia), recommended.

## 2022-12-25 NOTE — Patient Instructions (Addendum)
A few things to remember from today's visit:  Insomnia, unspecified type  Chronic cough  Dizziness  Essential hypertension, benign  Decrease Metoprolol dose from 1 tab to 1/2 tab 2 times daily. Monitor blood pressure and heart rate.  If you need refills for medications you take chronically, please call your pharmacy. Do not use My Chart to request refills or for acute issues that need immediate attention. If you send a my chart message, it may take a few days to be addressed, specially if I am not in the office.  Please be sure medication list is accurate. If a new problem present, please set up appointment sooner than planned today. No changes in gabapentin or other medication. Fall precautions.

## 2022-12-25 NOTE — Assessment & Plan Note (Addendum)
Orthostatic hypotension could be causing episodes of lightheadedness. Recommend decreasing dose of metoprolol titrate 25 mg from 1 tablet twice daily to 1/2 tablet twice daily. Monitor BP and HR regularly. Continue low-salt diet. Eye exam is current.

## 2022-12-25 NOTE — Assessment & Plan Note (Signed)
Reassured. We discussed diagnosis and prognosis. Aspirin 81 mg daily can aggravate problem.

## 2022-12-25 NOTE — Assessment & Plan Note (Signed)
He is reporting improvement of cough. Continue gabapentin 300 mg daily at bedtime. He follows with pulmonologist for COPD.

## 2022-12-28 ENCOUNTER — Telehealth: Payer: Self-pay | Admitting: Genetic Counselor

## 2022-12-28 ENCOUNTER — Encounter: Payer: Self-pay | Admitting: Genetic Counselor

## 2022-12-28 DIAGNOSIS — Z1379 Encounter for other screening for genetic and chromosomal anomalies: Secondary | ICD-10-CM | POA: Insufficient documentation

## 2022-12-28 NOTE — Telephone Encounter (Signed)
I contacted Tony [name] to discuss his genetic testing results. I spoke with his wife who accompanied him to the appointment. No pathogenic variants were identified in the 70 genes analyzed.  Detailed clinic note to follow.  The test report has been scanned into EPIC and is located under the Molecular Pathology section of the Results Review tab.  A portion of the result report is included below for reference.   Lalla Brothers, MS, [facility] Genetic Counselor [email or web address] (P) [phone]

## 2023-01-05 ENCOUNTER — Other Ambulatory Visit: Payer: Self-pay | Admitting: Family Medicine

## 2023-01-05 DIAGNOSIS — R053 Chronic cough: Secondary | ICD-10-CM

## 2023-01-11 ENCOUNTER — Ambulatory Visit: Payer: Self-pay | Admitting: Genetic Counselor

## 2023-01-11 ENCOUNTER — Encounter: Payer: Self-pay | Admitting: Genetic Counselor

## 2023-01-11 DIAGNOSIS — Z1379 Encounter for other screening for genetic and chromosomal anomalies: Secondary | ICD-10-CM

## 2023-01-11 NOTE — Progress Notes (Signed)
HPI:   Tony [name] was previously seen in the  Cancer Genetics clinic due to a personal and family history of cancer and concerns regarding a hereditary predisposition to cancer. Please refer to our prior cancer genetics clinic note for more information regarding our discussion, assessment and recommendations, at the time. Tony [name]'s recent genetic test results were disclosed to him, as were recommendations warranted by these results. These results and recommendations are discussed in more detail below.  CANCER HISTORY:  Oncology History  Malignant neoplasm of prostate (HCC)  02/23/2014 Initial Diagnosis   Malignant neoplasm of prostate     FAMILY HISTORY:  We obtained a detailed, 4-generation family history.  Significant diagnoses are listed below:      Family History  Problem Relation Age of Onset   Alzheimer's disease Mother     Alzheimer's disease Father     Prostate cancer Father     Arthritis Sister     Prostate cancer Paternal Grandfather     Prostate cancer Brother     Stomach cancer Brother     Lung cancer Brother     Prostate cancer Brother           Tony [name] has 8 brothers. Two brothers have a history of prostate cancer diagnosed at unknown ages, one is deceased and one is living. One of his brothers was diagnosed with lung cancer and another brother was diagnosed with stomach cancer, they are both deceased. His father was diagnosed with prostate cancer in his 32s, he died at age 2. His paternal grandfather was diagnosed with prostate cancer at an unknown age, he is deceased. Tony [name] is unaware of previous family history of genetic testing for hereditary cancer risks. There is no reported Ashkenazi Jewish ancestry.   GENETIC TEST RESULTS:  The [facility] Multi-Cancer Panel found no pathogenic mutations.   The Multi-Cancer + RNA Panel offered by [facility] includes sequencing and/or deletion/duplication analysis of the following 70 genes:  AIP*, ALK, APC*,  ATM*, AXIN2*, BAP1*, BARD1*, BLM*, BMPR1A*, BRCA1*, BRCA2*, BRIP1*, CDC73*, CDH1*, CDK4, CDKN1B*, CDKN2A, CHEK2*, CTNNA1*, DICER1*, EPCAM (del/dup only), EGFR, FH*, FLCN*, GREM1 (promoter dup only), HOXB13, KIT, LZTR1, MAX*, MBD4, MEN1*, MET, MITF, MLH1*, MSH2*, MSH3*, MSH6*, MUTYH*, NF1*, NF2*, NTHL1*, PALB2*, PDGFRA, PMS2*, POLD1*, POLE*, POT1*, PRKAR1A*, PTCH1*, PTEN*, RAD51C*, RAD51D*, RB1*, RET, SDHA* (sequencing only), SDHAF2*, SDHB*, SDHC*, SDHD*, SMAD4*, SMARCA4*, SMARCB1*, SMARCE1*, STK11*, SUFU*, TMEM127*, TP53*, TSC1*, TSC2*, VHL*. RNA analysis is performed for * genes.  The test report has been scanned into EPIC and is located under the Molecular Pathology section of the Results Review tab.  A portion of the result report is included below for reference. Genetic testing reported out on 12/24/2022.      Even though a pathogenic variant was not identified, possible explanations for the cancer in the family may include: There may be no hereditary risk for cancer in the family. The cancers in Tony [name] and/or his family may be due to other genetic or environmental factors. There may be a gene mutation in one of these genes that current testing methods cannot detect, but that chance is small. There could be another gene that has not yet been discovered, or that we have not yet tested, that is responsible for the cancer diagnoses in the family.  It is also possible there is a hereditary cause for the cancer in the family that Tony [name] did not inherit.  Therefore, it is important to remain in touch with cancer genetics in the  future so that we can continue to offer Tony [name] the most up to date genetic testing.   ADDITIONAL GENETIC TESTING:  We discussed with Tony [name] that his genetic testing was fairly extensive.  If there are genes identified to increase cancer risk that can be analyzed in the future, we would be happy to discuss and coordinate this testing at that time.    CANCER  SCREENING RECOMMENDATIONS:  Tony [name]'s test result is considered negative (normal).  This means that we have not identified a hereditary cause for his personal and family history of cancer at this time.   An individual's cancer risk and medical management are not determined by genetic test results alone. Overall cancer risk assessment incorporates additional factors, including personal medical history, family history, and any available genetic information that may result in a personalized plan for cancer prevention and surveillance. Therefore, it is recommended he continue to follow the cancer management and screening guidelines provided by his oncology and primary healthcare provider.  RECOMMENDATIONS FOR FAMILY MEMBERS:   Since he did not inherit a mutation in a cancer predisposition gene included on this panel, his children could not have inherited a mutation from him in one of these genes. Other members of the family may still carry a pathogenic variant in one of these genes that Tony [name] did not inherit. Based on the family history, we recommend his brother who has a history of prostate cancer consider genetic counseling and testing.   FOLLOW-UP:  Cancer genetics is a rapidly advancing field and it is possible that new genetic tests will be appropriate for him and/or his family members in the future. We encouraged him to remain in contact with cancer genetics on an annual basis so we can update his personal and family histories and let him know of advances in cancer genetics that may benefit this family.   Our contact number was provided. Mr. Trusty's questions were answered to his satisfaction, and he knows he is welcome to call us at anytime with additional questions or concerns.   Lalla Brothers, MS, [facility] Genetic Counselor [email or web address] (P) [phone]

## 2023-01-12 DIAGNOSIS — J9611 Chronic respiratory failure with hypoxia: Secondary | ICD-10-CM | POA: Diagnosis not present

## 2023-01-18 ENCOUNTER — Other Ambulatory Visit: Payer: Self-pay | Admitting: Family Medicine

## 2023-01-26 NOTE — Progress Notes (Signed)
Remote pacemaker transmission.   

## 2023-02-04 ENCOUNTER — Encounter: Payer: Self-pay | Admitting: Internal Medicine

## 2023-02-11 DIAGNOSIS — J9611 Chronic respiratory failure with hypoxia: Secondary | ICD-10-CM | POA: Diagnosis not present

## 2023-02-18 ENCOUNTER — Encounter: Payer: Self-pay | Admitting: Nurse Practitioner

## 2023-02-18 ENCOUNTER — Inpatient Hospital Stay: Payer: Medicare Other

## 2023-02-18 ENCOUNTER — Inpatient Hospital Stay: Payer: Medicare Other | Attending: Hematology | Admitting: Nurse Practitioner

## 2023-02-18 ENCOUNTER — Other Ambulatory Visit: Payer: Self-pay

## 2023-02-18 VITALS — BP 117/79 | HR 85 | Temp 97.7°F | Resp 17 | Wt 206.0 lb

## 2023-02-18 DIAGNOSIS — C61 Malignant neoplasm of prostate: Secondary | ICD-10-CM

## 2023-02-18 DIAGNOSIS — E669 Obesity, unspecified: Secondary | ICD-10-CM

## 2023-02-18 DIAGNOSIS — Z923 Personal history of irradiation: Secondary | ICD-10-CM | POA: Insufficient documentation

## 2023-02-18 DIAGNOSIS — Z5111 Encounter for antineoplastic chemotherapy: Secondary | ICD-10-CM | POA: Diagnosis not present

## 2023-02-18 LAB — CMP (CANCER CENTER ONLY)
ALT: 9 U/L (ref 0–44)
AST: 15 U/L (ref 15–41)
Albumin: 4.2 g/dL (ref 3.5–5.0)
Alkaline Phosphatase: 99 U/L (ref 38–126)
Anion gap: 10 (ref 5–15)
BUN: 18 mg/dL (ref 8–23)
CO2: 24 mmol/L (ref 22–32)
Calcium: 9.5 mg/dL (ref 8.9–10.3)
Chloride: 103 mmol/L (ref 98–111)
Creatinine: 1.04 mg/dL (ref 0.61–1.24)
GFR, Estimated: >60 mL/min (ref >60)
Glucose, Bld: 112 mg/dL — ABNORMAL HIGH (ref 70–99)
Potassium: 4.5 mmol/L (ref 3.5–5.1)
Sodium: 137 mmol/L (ref 135–145)
Total Bilirubin: 0.7 mg/dL (ref 0.3–1.2)
Total Protein: 7 g/dL (ref 6.5–8.1)

## 2023-02-18 LAB — CBC WITH DIFFERENTIAL (CANCER CENTER ONLY)
Abs Immature Granulocytes: 0.03 10*3/uL (ref 0.00–0.07)
Basophils Absolute: 0.1 10*3/uL (ref 0.0–0.1)
Basophils Relative: 1 %
Eosinophils Absolute: 0.1 10*3/uL (ref 0.0–0.5)
Eosinophils Relative: 2 %
HCT: 41.4 % (ref 39.0–52.0)
Hemoglobin: 13.6 g/dL (ref 13.0–17.0)
Immature Granulocytes: 1 %
Lymphocytes Relative: 17 %
Lymphs Abs: 1 10*3/uL (ref 0.7–4.0)
MCH: 28.3 pg (ref 26.0–34.0)
MCHC: 32.9 g/dL (ref 30.0–36.0)
MCV: 86.3 fL (ref 80.0–100.0)
Monocytes Absolute: 0.7 10*3/uL (ref 0.1–1.0)
Monocytes Relative: 12 %
Neutro Abs: 4.3 10*3/uL (ref 1.7–7.7)
Neutrophils Relative %: 67 %
Platelet Count: 247 10*3/uL (ref 150–400)
RBC: 4.8 MIL/uL (ref 4.22–5.81)
RDW: 14.6 % (ref 11.5–15.5)
WBC Count: 6.3 10*3/uL (ref 4.0–10.5)
nRBC: 0 % (ref 0.0–0.2)

## 2023-02-18 MED ORDER — LEUPROLIDE ACETATE (3 MONTH) 22.5 MG ~~LOC~~ KIT
22.5000 mg | PACK | Freq: Once | SUBCUTANEOUS | Status: AC
  Administered 2023-02-18: 22.5 mg via SUBCUTANEOUS
  Filled 2023-02-18: qty 22.5

## 2023-02-18 NOTE — Patient Instructions (Signed)
Leuprolide Suspension for Injection (Prostate Cancer) What is this medication? LEUPROLIDE (loo PROE lide) reduces the symptoms of prostate cancer. It works by decreasing levels of the hormone testosterone in the body. This prevents prostate cancer cells from spreading or growing. This medicine may be used for other purposes; ask your health care provider or pharmacist if you have questions. COMMON BRAND NAME(S): Eligard, Lupron Depot, Lupron Depot-Ped, Lutrate Depot, Viadur What should I tell my care team before I take this medication? They need to know if you have any of these conditions: Diabetes Heart disease Heart failure High or low levels of electrolytes, such as magnesium, potassium, or sodium in your blood Irregular heartbeat or rhythm Seizures An unusual or allergic reaction to leuprolide, other medications, foods, dyes, or preservatives Pregnant or trying to get pregnant Breast-feeding How should I use this medication? This medication is injected under the skin or into a muscle. It is given by your care team in a hospital or clinic setting. Talk to your care team about the use of this medication in children. Special care may be needed. Overdosage: If you think you have taken too much of this medicine contact a poison control center or emergency room at once. NOTE: This medicine is only for you. Do not share this medicine with others. What if I miss a dose? Keep appointments for follow-up doses. It is important not to miss your dose. Call your care team if you are unable to keep an appointment. What may interact with this medication? Do not take this medication with any of the following: Cisapride Dronedarone Ketoconazole Levoketoconazole Pimozide Thioridazine This medication may also interact with the following: Other medications that cause heart rhythm changes This list may not describe all possible interactions. Give your health care provider a list of all the medicines,  herbs, non-prescription drugs, or dietary supplements you use. Also tell them if you smoke, drink alcohol, or use illegal drugs. Some items may interact with your medicine. What should I watch for while using this medication? Visit your care team for regular checks on your progress. Tell your care team if your symptoms do not start to get better or if they get worse. This medication may increase blood sugar. The risk may be higher in patients who already have diabetes. Ask your care team what you can do to lower the risk of diabetes while taking this medication. This medication may cause infertility. Talk to your care team if you are concerned about your fertility. Heart attacks and strokes have been reported with the use of this medication. Get emergency help if you develop signs or symptoms of a heart attack or stroke. Talk to your care team about the risks and benefits of this medication. What side effects may I notice from receiving this medication? Side effects that you should report to your care team as soon as possible: Allergic reactions--skin rash, itching, hives, swelling of the face, lips, tongue, or throat Heart attack--pain or tightness in the chest, shoulders, arms, or jaw, nausea, shortness of breath, cold or clammy skin, feeling faint or lightheaded Heart rhythm changes--fast or irregular heartbeat, dizziness, feeling faint or lightheaded, chest pain, trouble breathing High blood sugar (hyperglycemia)--increased thirst or amount of urine, unusual weakness or fatigue, blurry vision Mood swings, irritability, hostility Seizures Stroke--sudden numbness or weakness of the face, arm, or leg, trouble speaking, confusion, trouble walking, loss of balance or coordination, dizziness, severe headache, change in vision Thoughts of suicide or self-harm, worsening mood, feelings of depression Side   effects that usually do not require medical attention (report to your care team if they continue or  are bothersome): Bone pain Change in sex drive or performance General discomfort and fatigue Hot flashes Muscle pain Pain, redness, or irritation at injection site Swelling of the ankles, hands, or feet This list may not describe all possible side effects. Call your doctor for medical advice about side effects. You may report side effects to FDA at 1-800-FDA-1088. Where should I keep my medication? This medication is given in a hospital or clinic. It will not be stored at home. NOTE: This sheet is a summary. It may not cover all possible information. If you have questions about this medicine, talk to your doctor, pharmacist, or health care provider.  2024 Elsevier/Gold Standard (2021-11-12 00:00:00)  

## 2023-02-18 NOTE — Progress Notes (Signed)
Patient Care Team: [place], [name], MD as PCP - General (Family Medicine) Croitoru, Rachelle Hora, MD as PCP - Cardiology (Cardiology) Verner Chol, [facility] (Inactive) as Pharmacist (Pharmacist) Malachy Mood, MD as Attending Physician (Hematology and Oncology)   CHIEF COMPLAINT: Follow-up prostate cancer  CURRENT THERAPY:  Eligard 22.5 mg every 3 months.    Xtandi 160 mg daily started on June 15, 2022   INTERVAL HISTORY Tony [name] returns for follow-up as scheduled, last seen by Dr. Mosetta Putt 11/18/2022 and received another Eligard injection.  He continues daily Xtandi. Tolerating his regimen well without side effects. Skin is fragile with easy bruising but no bleeding. Denies fall, pain, n/v/c/d, fever, chills, or any other new specific concerns.   ROS  All other systems reviewed and negative   Past Medical History:  Diagnosis Date   Anxiety    Arthritis    COPD (chronic obstructive pulmonary disease) (HCC)    Coronary artery disease    Dyspnea    Gastric outlet obstruction 02/03/2018   Tony [name] 02/03/2018   GERD (gastroesophageal reflux disease)    History of hiatal hernia    small/notes 02/03/2018   Hypertension    no meds   Presence of permanent cardiac pacemaker    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   Gleason 4+5=9, PSA 15.65   Radiation    Sinus problem    SSS (sick sinus syndrome) (HCC)      Past Surgical History:  Procedure Laterality Date   APPENDECTOMY  2019   hx hematoma s/p appendectomy   CHOLECYSTECTOMY N/A 05/15/2022   Procedure: LAPAROSCOPIC CHOLECYSTECTOMY;  Surgeon: Fritzi Mandes, MD;  Location: [facility] ORS;  Service: General;  Laterality: N/A;   ESOPHAGOGASTRODUODENOSCOPY (EGD) WITH PROPOFOL N/A 02/03/2018   Procedure: ESOPHAGOGASTRODUODENOSCOPY (EGD) WITH PROPOFOL;  Surgeon: Vida Rigger, MD;  Location: [facility] ENDOSCOPY;  Service: Endoscopy;  Laterality: N/A;   FRACTURE SURGERY     INCISIONAL HERNIA REPAIR N/A 10/14/2022   Procedure: INCISIONAL HERNIA REPAIR WITH MESH;   Surgeon: Fritzi Mandes, MD;  Location: [facility];  Service: General;  Laterality: N/A;   INSERT / REPLACE / REMOVE PACEMAKER  06/14/2018   LAPAROSCOPIC APPENDECTOMY N/A 01/21/2018   Procedure: APPENDECTOMY LAPAROSCOPIC;  Surgeon: Rodman Pickle, MD;  Location: [facility] OR;  Service: General;  Laterality: N/A;   PACEMAKER IMPLANT N/A 06/14/2018   Procedure: PACEMAKER IMPLANT - Dual Chamber;  Surgeon: Thurmon Fair, MD;  Location: [facility] INVASIVE CV LAB;  Service: Cardiovascular;  Laterality: N/A;   PROSTATE BIOPSY  02/08/14   gleason 4+5=9, 12/12 cores positive, 54 gm   RADIOACTIVE SEED IMPLANT N/A 06/29/2014   Procedure: RADIOACTIVE SEED IMPLANT;  Surgeon: Valetta Fuller, MD;  Location: [facility];  Service: Urology;  Laterality: N/A;   WRIST FRACTURE SURGERY Right 1980s     Outpatient Encounter Medications as of 02/18/2023  Medication Sig   acetaminophen (TYLENOL) 500 MG tablet Take 2 tablets (1,000 mg total) by mouth 3 (three) times daily.   albuterol (VENTOLIN HFA) 108 (90 Base) MCG/ACT inhaler TAKE 2 PUFFS BY MOUTH EVERY 6 HOURS AS NEEDED FOR WHEEZE OR SHORTNESS OF BREATH   ALPRAZolam (XANAX) 1 MG tablet TAKE 1/2 TABLET TWICE DAILY AS NEEDED   aspirin EC 81 MG tablet Take 81 mg by mouth daily.   atorvastatin (LIPITOR) 20 MG tablet TAKE 1 TABLET BY MOUTH DAILY   benzonatate (TESSALON) 100 MG capsule TAKE 1 CAPSULE BY MOUTH EVERY DAY AS NEEDED   citalopram (CELEXA) 10 MG tablet  Take 1 tablet (10 mg total) by mouth daily.   diclofenac Sodium (VOLTAREN) 1 % GEL AAPLY 4 GRAMS TOPICALLY 4 TIMES DAILY (Patient taking differently: Apply 1 Application topically 4 (four) times daily as needed (pain).)   docusate sodium (COLACE) 100 MG capsule Take 1 capsule (100 mg total) by mouth 2 (two) times daily.   enzalutamide (XTANDI) 40 MG tablet Take 4 tablets (160 mg total) by mouth daily.   fluticasone (FLONASE) 50 MCG/ACT nasal spray PLACE 1 SPRAY INTO BOTH NOSTRILS DAILY AS NEEDED FOR  ALLERGIES OR RHINITIS (AT BEDTIME).   furosemide (LASIX) 20 MG tablet TAKE 1 TABLET BY MOUTH DAILY   gabapentin (NEURONTIN) 300 MG capsule Take 1 capsule (300 mg total) by mouth at bedtime.   magnesium hydroxide (MILK OF MAGNESIA) 400 MG/5ML suspension Take 30 mLs by mouth daily as needed for mild constipation.   Melatonin 10 MG CAPS Take 20 mg by mouth at bedtime.   metoprolol tartrate (LOPRESSOR) 25 MG tablet Take 0.5 tablets (12.5 mg total) by mouth 2 (two) times daily.   Multiple Vitamin (MULTIVITAMIN WITH MINERALS) TABS tablet Take 0.5 tablets by mouth daily.   omeprazole (PRILOSEC) 20 MG capsule TAKE 1 CAPSULE BY MOUTH DAILY BEFORE BREAKFAST.   Tiotropium Bromide Monohydrate (SPIRIVA RESPIMAT) 2.5 MCG/ACT AERS Inhale 2 puffs into the lungs daily. (Patient not taking: Reported on 02/18/2023)   No facility-administered encounter medications on file as of 02/18/2023.     Today's Vitals   02/18/23 0922  BP: 117/79  Pulse: 85  Resp: 17  Temp: 97.7 F (36.5 C)  TempSrc: Oral  SpO2: 96%  Weight: 206 lb (93.4 kg)   Body mass index is 32.26 kg/m.   PHYSICAL EXAM GENERAL:alert, no distress and comfortable SKIN: no rash.  Scattered ecchymoses over the b/l forearms EYES: sclera clear LUNGS: clear with normal breathing effort HEART: regular rate & rhythm ABDOMEN: abdomen soft, non-tender and normal bowel sounds NEURO: alert & oriented x 3 with fluent speech  CBC    Component Value Date/Time   WBC 6.3 02/18/2023 0827   WBC 5.6 10/08/2022 1044   RBC 4.80 02/18/2023 0827   HGB 13.6 02/18/2023 0827   HGB 14.9 05/15/2021 0927   HGB 15.8 08/27/2017 0915   HCT 41.4 02/18/2023 0827   HCT 42.9 05/15/2021 0927   HCT 48.5 08/27/2017 0915   PLT 247 02/18/2023 0827   PLT 196 05/15/2021 0927   MCV 86.3 02/18/2023 0827   MCV 89 05/15/2021 0927   MCV 97.0 08/27/2017 0915   MCH 28.3 02/18/2023 0827   MCHC 32.9 02/18/2023 0827   RDW 14.6 02/18/2023 0827   RDW 11.8 05/15/2021 0927   RDW  13.4 08/27/2017 0915   LYMPHSABS 1.0 02/18/2023 0827   LYMPHSABS 0.8 (L) 08/27/2017 0915   MONOABS 0.7 02/18/2023 0827   MONOABS 0.3 08/27/2017 0915   EOSABS 0.1 02/18/2023 0827   EOSABS 0.1 08/27/2017 0915   BASOSABS 0.1 02/18/2023 0827   BASOSABS 0.0 08/27/2017 0915     CMP     Component Value Date/Time   NA 137 02/18/2023 0827   NA 139 02/03/2019 0857   NA 141 08/27/2017 0915   K 4.5 02/18/2023 0827   K 4.8 08/27/2017 0915   CL 103 02/18/2023 0827   CO2 24 02/18/2023 0827   CO2 26 08/27/2017 0915   GLUCOSE 112 (H) 02/18/2023 0827   GLUCOSE 108 08/27/2017 0915   BUN 18 02/18/2023 0827   BUN 13 02/03/2019 0857  BUN 16.2 08/27/2017 0915   CREATININE 1.04 02/18/2023 0827   CREATININE 1.07 07/31/2020 1325   CREATININE 0.9 08/27/2017 0915   CALCIUM 9.5 02/18/2023 0827   CALCIUM 9.8 08/27/2017 0915   PROT 7.0 02/18/2023 0827   PROT 7.3 08/27/2017 0915   ALBUMIN 4.2 02/18/2023 0827   ALBUMIN 4.1 08/27/2017 0915   AST 15 02/18/2023 0827   AST 23 08/27/2017 0915   ALT 9 02/18/2023 0827   ALT 17 08/27/2017 0915   ALKPHOS 99 02/18/2023 0827   ALKPHOS 83 08/27/2017 0915   BILITOT 0.7 02/18/2023 0827   BILITOT 0.95 08/27/2017 0915   GFRNONAA >60 02/18/2023 0827   GFRNONAA 66 07/31/2020 1325   GFRAA 77 07/31/2020 1325     ASSESSMENT & PLAN:Tony [name] is a 82 y.o. male with    Malignant neoplasm of prostate; initially [id number] diagnosed in 2015, with Gleason score 4+5 = 9 and a PSA 15.7, biochemical relapse in 2017  -Diagnosed 2015, s/p radiation therapy with external beam and seed implant brachytherapy boost completed in October 2015 -He received 1 year androgen deprivation in 2016. -Due to biochemical relapse in 2017, he re-started ADT -Zytiga with prednisone started in August 2017, discontinued in October 2023 because of insurance coverage. -He was switched to Xtandi 160 mg daily in October 2023 -Last CT scan in 08/2022 showed no evidence of metastasis. PSA  remains undetectable -Recent genetic testing is negative -Mr. Charlet appears stable, continues daily Xtandi and q3 mo ADT, tolerating well with no significant side effects. Maintaining adequate PS. No clinical evidence of disease progression -Labs reviewed, PSA is pending. Continue Xtandi and proceed with Eligard today as scheduled.  -F/up in 3 months with next injection   PLAN: -Labs reviewed, PSA is pending -Proceed with Eligard today, continue q3 months, and continue daily Xtandi -Lab, f/up, and Eligard in 3 months     All questions were answered. The patient knows to call the clinic with any problems, questions or concerns. No barriers to learning were detected.  Santiago Glad, NP-C 02/18/2023

## 2023-02-19 ENCOUNTER — Ambulatory Visit: Payer: Medicare Other | Admitting: Internal Medicine

## 2023-02-20 LAB — PROSTATE-SPECIFIC AG, SERUM (LABCORP): Prostate Specific Ag, Serum: <0.1 ng/mL (ref 0.0–4.0)

## 2023-03-14 DIAGNOSIS — J9611 Chronic respiratory failure with hypoxia: Secondary | ICD-10-CM | POA: Diagnosis not present

## 2023-03-15 ENCOUNTER — Other Ambulatory Visit: Payer: Self-pay | Admitting: Family Medicine

## 2023-03-15 DIAGNOSIS — F064 Anxiety disorder due to known physiological condition: Secondary | ICD-10-CM

## 2023-03-16 ENCOUNTER — Ambulatory Visit (INDEPENDENT_AMBULATORY_CARE_PROVIDER_SITE_OTHER): Payer: Medicare Other

## 2023-03-16 DIAGNOSIS — I495 Sick sinus syndrome: Secondary | ICD-10-CM

## 2023-03-16 LAB — CUP PACEART REMOTE DEVICE CHECK
Battery Remaining Longevity: 108 mo
Battery Voltage: 3 V
Brady Statistic AP VP Percent: 0.1 %
Brady Statistic AP VS Percent: 76.08 %
Brady Statistic AS VP Percent: 0.02 %
Brady Statistic AS VS Percent: 23.8 %
Brady Statistic RA Percent Paced: 75.55 %
Brady Statistic RV Percent Paced: 0.12 %
Date Time Interrogation Session: 20240701224322
Implantable Lead Connection Status: 753985
Implantable Lead Connection Status: 753985
Implantable Lead Implant Date: 20191001
Implantable Lead Implant Date: 20191001
Implantable Lead Location: 753859
Implantable Lead Location: 753860
Implantable Lead Model: 5076
Implantable Lead Model: 5076
Implantable Pulse Generator Implant Date: 20191001
Lead Channel Impedance Value: 304 Ohm
Lead Channel Impedance Value: 323 Ohm
Lead Channel Impedance Value: 380 Ohm
Lead Channel Impedance Value: 494 Ohm
Lead Channel Pacing Threshold Amplitude: 0.375 V
Lead Channel Pacing Threshold Amplitude: 0.75 V
Lead Channel Pacing Threshold Pulse Width: 0.4 ms
Lead Channel Pacing Threshold Pulse Width: 0.4 ms
Lead Channel Sensing Intrinsic Amplitude: 2.625 mV
Lead Channel Sensing Intrinsic Amplitude: 2.625 mV
Lead Channel Sensing Intrinsic Amplitude: 3.625 mV
Lead Channel Sensing Intrinsic Amplitude: 3.625 mV
Lead Channel Setting Pacing Amplitude: 1.5 V
Lead Channel Setting Pacing Amplitude: 2.5 V
Lead Channel Setting Pacing Pulse Width: 0.4 ms
Lead Channel Setting Sensing Sensitivity: 0.9 mV
Zone Setting Status: 755011
Zone Setting Status: 755011

## 2023-03-21 NOTE — Progress Notes (Unsigned)
HPI M former smoker followed for OSA, COPD, hx Respiratory Failure, dCHF w pulmonary edema, hx E.Coli Bacteremia, CAD, Chronic AFib/ Pacemaker, Prostate Cancer, Aortic Aneurysm HST 09/02/20- AHI 40.8/ hr, desaturation to 66%/ avbg 88%, body weight 228 lbs Pending CPAP titration w O2 check on 2/22 PFT 09/09/20- Minimal obstruction CPAP titration 11/05/20- to BIPAP 25/21 and required O2 3l Walk Test on Room Air- O2 qualifying 02/16/22- min sat 87% after 2 laps, Max HR 92 (paced).  =====================================================================   08/20/22-  81 yoM former smoker followed for OSA, COPD, hx Respiratory Failure, CAD, dCHF w pulmonary edema, hx E.Coli Bacteremia, Chronic AFib/ Pacemaker, Prostate Cancer,  Celiac Stenosis, Obesity, Covid pneumonia April2023, Cholelithiasis, Aortic aneurysms followed by Dr Laneta Simmers -Symbicort 160, Proair hfa    [facility]una BiPAP 25/21 PS 2, with O2 3L for sleep/ [employer] Download-compliance   blank SD card Body weight today 199 lbs Covid vax-4 PHIZER                            Wife  and daughter here Flu vax had He is pending evaluation for possible repair of a ventral abdominal hernia. Both of his inhalers are red and he is very unclear which is which, but he says he has several of one of the kinds.  It sounds as if he may be using Symbicort each morning and rescue inhaler each evening.  I went over this with him and his family and I wrote it out on his AVS.  I do not think we want to make his medications more complicated or more expensive if we can avoid it. It sounds as if he is using his BiPAP machine but he will have to take it into the homecare company to get a functioning SD card.  03/22/23-  81 yoM former smoker followed for OSA, COPD, hx Respiratory Failure, CAD, dCHF w pulmonary edema, hx E.Coli Bacteremia, Chronic AFib/ Pacemaker, Prostate Cancer,  Celiac Stenosis, Obesity, Covid pneumonia April2023, Cholelithiasis, Aortic aneurysms followed by Dr  Laneta Simmers -Spriva 2.5, Proair hfa    Luna BiPAP 25/21 PS 2, with O2 3L for sleep/ [employer] Download-compliance   blank SD card Body weight today   CXR 10/27/22- IMPRESSION: 1. No active cardiopulmonary disease. No evidence of pneumonia or pulmonary edema. 2. Stable cardiomegaly. 3. Chronic bronchitic changes and/or chronic interstitial lung disease.  ROS-see HPI   + = positive Constitutional:    weight loss, night sweats, fevers, chills, fatigue, lassitude. HEENT:    headaches, difficulty swallowing, tooth/dental problems, sore throat,       sneezing, itching, ear ache, nasal congestion, post nasal drip, snoring CV:    chest pain, orthopnea, PND, swelling in lower extremities, anasarca,                                   dizziness, +palpitations Resp:   +shortness of breath with exertion or at rest.                productive cough,   +non-productive cough, coughing up of blood.              change in color of mucus.  wheezing.   Skin:    rash or lesions. GI:  + heartburn, indigestion, abdominal pain, nausea, vomiting, diarrhea,                 change in bowel habits,  loss of appetite GU: dysuria, change in color of urine, no urgency or frequency.   flank pain. MS:   joint pain, stiffness, decreased range of motion, back pain. Neuro-     nothing unusual Psych:  change in mood or affect.  depression or +anxiety.   memory loss.  OBJ- Physical Exam General- Alert, Oriented, Affect-appropriate, Distress- none acute, + obese Skin- rash-none, lesions- none, excoriation- none Lymphadenopathy- none Head- atraumatic            Eyes- Gross vision intact, PERRLA, conjunctivae and secretions clear            Ears- Hearing, canals-normal            Nose- Clear, no-Septal dev, mucus, polyps, erosion, perforation             Throat- Mallampati IV , mucosa clear , drainage- none, tonsils- atrophic, + teeth Neck- flexible , trachea midline, no stridor , thyroid nl, carotid no bruit Chest - symmetrical  excursion , unlabored           Heart/CV- RRR , no murmur , no gallop  , no rub, nl s1 s2                           - JVD- none , edema+trace, stasis changes- none, varices- none           Lung- + coarse/ unlabored, wheeze+slight, cough- none , dullness-none, rub- none           Chest wall- +pacemaker L  Abd-  Br/ Gen/ Rectal- Not done, not indicated Extrem- cyanosis- none, clubbing, none, atrophy- none, strength- nl, + cane Neuro- grossly intact to observation

## 2023-03-22 ENCOUNTER — Ambulatory Visit: Payer: Medicare Other | Admitting: Internal Medicine

## 2023-03-22 ENCOUNTER — Encounter: Payer: Self-pay | Admitting: Internal Medicine

## 2023-03-22 VITALS — BP 120/76 | HR 80 | Ht 68.0 in | Wt 208.2 lb

## 2023-03-22 DIAGNOSIS — G4733 Obstructive sleep apnea (adult) (pediatric): Secondary | ICD-10-CM | POA: Diagnosis not present

## 2023-03-22 DIAGNOSIS — J9611 Chronic respiratory failure with hypoxia: Secondary | ICD-10-CM | POA: Diagnosis not present

## 2023-03-22 DIAGNOSIS — J449 Chronic obstructive pulmonary disease, unspecified: Secondary | ICD-10-CM | POA: Diagnosis not present

## 2023-03-22 NOTE — Assessment & Plan Note (Signed)
I emphasized compliance goals and purpose for him and his daughter today.  We will keep working on this.  He indicates is cough that makes him take the mask off, not other discomfort. Plan continue BiPAP 20/4, PS 6

## 2023-03-22 NOTE — Assessment & Plan Note (Signed)
He continues to use O2 3 L through his CPAP machine.

## 2023-03-22 NOTE — Assessment & Plan Note (Addendum)
He is very clear on exam today.  I do not know if changing inhalers will make a difference at night but I need to see what he is using so I have asked him to bring them in next visit. Cough seems mainly a problem when he is lying down which suggest the possibility that there is an edema or reflux positional effect.  It was worse in the spring.

## 2023-03-22 NOTE — Patient Instructions (Signed)
Try to use your CPAP at least 4 hours or more every night if you can.  Please bring your inhalers with you when you come, so I can see what you are taking.

## 2023-03-26 ENCOUNTER — Other Ambulatory Visit: Payer: Self-pay | Admitting: Family Medicine

## 2023-03-26 DIAGNOSIS — R053 Chronic cough: Secondary | ICD-10-CM

## 2023-04-05 DIAGNOSIS — G4733 Obstructive sleep apnea (adult) (pediatric): Secondary | ICD-10-CM | POA: Diagnosis not present

## 2023-04-09 NOTE — Progress Notes (Signed)
Remote pacemaker transmission.   

## 2023-04-09 NOTE — Progress Notes (Unsigned)
HPI: Tony [name] is a 82 y.o. 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  Last seen on 12/25/22  No new problems since his last visit.  He has seen his oncologist and pulmonologist.  -Last visit gabapentin 300 mg at bedtime was added to help with chronic cough, he is still taking benzonatate 100 mg daily. He states the gabapentin helps him sleep at night and reduces his coughing during that time. COPD on albuterol inhaler 2 puff every 4-6 hours as needed and Spiriva Respimat 2.5 mcg 2 puff daily. He follows with pulmonologist. No associated DOE or wheezing. He experiences postnasal drip and rhinorrhea.  -He reports experiencing tingling in his fingers, predominantly in his left hand, for the past couple of months. The tingling occurs more frequently during the day and evening. He denies any difficulty gripping objects or dropping things. He is considering using squeeze balls to help alleviate the tingling sensation.  Right-handed.  Anxiety: He is currently on alprazolam, half tablet twice daily and Celexa 10 mg daily.  Medications are still helping with anxiety. He has noted better sleep since starting gabapentin.  -Abdominal aortic aneurysm: According to patient, problem is being followed by cardiologist. 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  Annual CT was recommended. He follows with Dr Tony [name], last visit on 01/28/2022.  -He mentions easy bruising, particularly on his arms with minor or no trauma.  Negative for nose/gum bleeding, gross hematuria, melena, or blood in the stool. Lab Results  Component Value Date   WBC 6.3 02/18/2023   HGB 13.6 02/18/2023   HCT 41.4 02/18/2023   MCV 86.3 02/18/2023   PLT 247  02/18/2023   He is wife is concerned about dilated superficial veins, specifically about right calf. He denies LE pain or erythema. He is on furosemide 20 mg daily.  Hypertension: Currently he is taking metoprolol tartrate 25 mg 1/2 tablet twice daily. BP readings at home similar to today's number. Negative for unusual or severe headache, visual changes, exertional chest pain, dyspnea,  focal weakness, or worsening edema.  Lab Results  Component Value Date   CREATININE 1.04 02/18/2023   BUN 18 02/18/2023   NA 137 02/18/2023   K 4.5 02/18/2023   CL 103 02/18/2023   CO2 24 02/18/2023   Prediabetes: Negative for polydipsia and polyphagia.  Lab Results  Component Value Date   HGBA1C 6.0 05/12/2022   Hyperlipidemia: Currently he is on atorvastatin 20 mg daily. He is tolerating medication well.  Lab Results  Component Value Date   CHOL 191 12/30/2021   HDL 55 12/30/2021   LDLCALC 121 (H) 12/30/2021   TRIG 76 12/30/2021   CHOLHDL 3.5 12/30/2021   Review of Systems  Constitutional:  Negative for activity change, appetite change, chills and fever.  HENT:  Negative for mouth sores and sore throat.   Gastrointestinal:  Negative for abdominal pain, nausea and vomiting.  Endocrine: Negative for cold intolerance and heat intolerance.  Genitourinary:  Negative for decreased urine volume and dysuria.  Musculoskeletal:  Positive for arthralgias and gait problem.  Skin:  Negative for rash.  Neurological:  Negative for syncope and facial asymmetry.  Psychiatric/Behavioral:  Negative for confusion and hallucinations.   See other pertinent positives and negatives in HPI.  Current Outpatient Medications on File Prior to  Visit  Medication Sig Dispense Refill   acetaminophen (TYLENOL) 500 MG tablet Take 2 tablets (1,000 mg total) by mouth 3 (three) times daily. 30 tablet 0   albuterol (VENTOLIN HFA) 108 (90 Base) MCG/ACT inhaler TAKE 2 PUFFS BY MOUTH EVERY 6 HOURS AS NEEDED FOR WHEEZE OR  SHORTNESS OF BREATH 8.5 each 12   ALPRAZolam (XANAX) 1 MG tablet TAKE 1/2 TABLET TWICE DAILY AS NEEDED 30 tablet 2   aspirin EC 81 MG tablet Take 81 mg by mouth daily.     atorvastatin (LIPITOR) 20 MG tablet TAKE 1 TABLET BY MOUTH DAILY 90 tablet 3   benzonatate (TESSALON) 100 MG capsule TAKE 1 CAPSULE BY MOUTH EVERY DAY AS NEEDED 30 capsule 1   citalopram (CELEXA) 10 MG tablet Take 1 tablet (10 mg total) by mouth daily. 90 tablet 3   diclofenac Sodium (VOLTAREN) 1 % GEL AAPLY 4 GRAMS TOPICALLY 4 TIMES DAILY (Patient taking differently: Apply 1 Application topically 4 (four) times daily as needed (pain).) 400 g 2   enzalutamide (XTANDI) 40 MG tablet Take 4 tablets (160 mg total) by mouth daily. 120 tablet 2   fluticasone (FLONASE) 50 MCG/ACT nasal spray PLACE 1 SPRAY INTO BOTH NOSTRILS DAILY AS NEEDED FOR ALLERGIES OR RHINITIS (AT BEDTIME). 48 mL 1   furosemide (LASIX) 20 MG tablet TAKE 1 TABLET BY MOUTH DAILY 90 tablet 3   gabapentin (NEURONTIN) 300 MG capsule Take 1 capsule (300 mg total) by mouth at bedtime. 30 capsule 3   Melatonin 10 MG CAPS Take 20 mg by mouth at bedtime.     metoprolol tartrate (LOPRESSOR) 25 MG tablet Take 0.5 tablets (12.5 mg total) by mouth 2 (two) times daily. 180 tablet 3   Multiple Vitamin (MULTIVITAMIN WITH MINERALS) TABS tablet Take 0.5 tablets by mouth daily.     omeprazole (PRILOSEC) 20 MG capsule TAKE 1 CAPSULE BY MOUTH DAILY BEFORE BREAKFAST. 90 capsule 3   Tiotropium Bromide Monohydrate (SPIRIVA RESPIMAT) 2.5 MCG/ACT AERS Inhale 2 puffs into the lungs daily. 4 g 5   No current facility-administered medications on file prior to visit.    Past Medical History:  Diagnosis Date   Anxiety    Arthritis    COPD (chronic obstructive pulmonary disease) (HCC)    Coronary artery disease    Dyspnea    Gastric outlet obstruction 02/03/2018   [name] 02/03/2018   GERD (gastroesophageal reflux disease)    History of hiatal hernia    small/notes 02/03/2018    Hypertension    no meds   Presence of permanent cardiac pacemaker    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   Gleason 4+5=9, PSA 15.65   Radiation    Sinus problem    SSS (sick sinus syndrome) (HCC)    Allergies  Allergen Reactions   Penicillins Rash    Has patient had a PCN reaction causing immediate rash, facial/tongue/throat swelling, SOB or lightheadedness with hypotension: Yes Has patient had a PCN reaction causing severe rash involving mucus membranes or skin necrosis: No Has patient had a PCN reaction that required hospitalization: No Has patient had a PCN reaction occurring within the last 10 years: No If all of the above answers are "NO", then may proceed with Cephalosporin use.    Social History   Socioeconomic History   Marital status: Married    Spouse name: Not on file   Number of children: 3   Years of education: Not on file   Highest education level: Not on file  Occupational  History   Occupation: Occupational psychologist: [employer]    Comment: retired   Occupation: Production designer, theatre/television/film    Comment: [employer]-[place]ired  Tobacco Use   Smoking status: Former    Types: Cigars    Quit date: 09/14/2012    Years since quitting: 10.5   Smokeless tobacco: Never   Tobacco comments:    little cigars; the small ones"  Vaping Use   Vaping status: Never Used  Substance and Sexual Activity   Alcohol use: Not Currently    Alcohol/week: 14.0 standard drinks of alcohol    Types: 14 Cans of beer per week    Comment: quit in 2000   Drug use: No   Sexual activity: Not Currently  Other Topics Concern   Not on file  Social History Narrative   Lives with wife and 2 dogs in one level home; daughter and her family co-habitate.   Has three daughters, all in [place], supportive. Five grandchildren.   Wants to return to doing silver sneakers once gym re-opens.         Social Determinants of Health   Financial Resource Strain: Low Risk  (07/29/2022)   Overall Financial Resource Strain (CARDIA)     Difficulty of Paying Living Expenses: Not hard at all  Recent Concern: Financial Resource Strain - Medium Risk (05/27/2022)   Overall Financial Resource Strain (CARDIA)    Difficulty of Paying Living Expenses: Somewhat hard  Food Insecurity: No Food Insecurity (10/15/2022)   Hunger Vital Sign    Worried About Running Out of Food in the Last Year: Never true    Ran Out of Food in the Last Year: Never true  Transportation Needs: No Transportation Needs (10/15/2022)   PRAPARE - Administrator, Civil Service (Medical): No    Lack of Transportation (Non-Medical): No  Physical Activity: Insufficiently Active (07/29/2022)   Exercise Vital Sign    Days of Exercise per Week: 5 days    Minutes of Exercise per Session: 20 min  Stress: No Stress Concern Present (07/29/2022)   [employer] of Occupational Health - Occupational Stress Questionnaire    Feeling of Stress : Not at all  Social Connections: Socially Integrated (07/29/2022)   Social Connection and Isolation Panel [NHANES]    Frequency of Communication with Friends and Family: More than three times a week    Frequency of Social Gatherings with Friends and Family: More than three times a week    Attends Religious Services: More than 4 times per year    Active Member of [employer] or Organizations: Yes    Attends Banker Meetings: More than 4 times per year    Marital Status: Married    Vitals:   04/12/23 0918  BP: 110/70  Pulse: 91  Resp: 16  SpO2: 94%   Body mass index is 31.63 kg/m.  Physical Exam Vitals and nursing note reviewed.  Constitutional:      General: He is not in acute distress.    Appearance: He is well-developed and well-groomed.  HENT:     Head: Normocephalic and atraumatic.     Mouth/Throat:     Mouth: Mucous membranes are moist.     Dentition: Has dentures.     Pharynx: Oropharynx is clear.  Eyes:     Conjunctiva/sclera: Conjunctivae normal.  Cardiovascular:     Rate and Rhythm:  Normal rate. Rhythm irregular.     Pulses:          Posterior tibial  pulses are 2+ on the right side and 2+ on the left side.     Heart sounds: No murmur heard. Pulmonary:     Effort: Pulmonary effort is normal. No respiratory distress.     Breath sounds: Normal breath sounds. No wheezing, rhonchi or rales.  Musculoskeletal:     Right lower leg: 1+ Pitting Edema present.     Left lower leg: 1+ Pitting Edema present.     Comments: Left wrist tinel sign positive.   Lymphadenopathy:     Cervical: No cervical adenopathy.  Skin:    General: Skin is warm.     Findings: No erythema or rash.  Neurological:     Mental Status: He is alert and oriented to person, place, and time.     Comments: Gait assisted by a cane.  Psychiatric:        Mood and Affect: Mood and affect normal.   ASSESSMENT AND PLAN:  Tony [name] was seen today for medical management of chronic issues.  Diagnoses and all orders for this visit: Lab Results  Component Value Date   HGBA1C 6.3 04/12/2023   Lab Results  Component Value Date   CHOL 162 04/12/2023   HDL 70.20 04/12/2023   LDLCALC 76 04/12/2023   TRIG 79.0 04/12/2023   CHOLHDL 2 04/12/2023   Lab Results  Component Value Date   TSH 2.05 04/12/2023   Lab Results  Component Value Date   VITAMINB12 576 04/12/2023   Prediabetes Assessment & Plan: Encouraged a healthy lifestyle for diabetes prevention. Last hemoglobin A1c 6.0 in 04/2022. Further recommendation will be given according to hemoglobin A1c result.  Orders: -     Hemoglobin A1c; Future  Hypercholesterolemia Assessment & Plan: Currently on atorvastatin 20 mg daily. Continue low-fat diet. Last LDL 121 in 12/2021. Further recommendation will be given according to lipid panel result.  Orders: -     Lipid panel; Future  Essential hypertension, benign Assessment & Plan: BP adequately controlled. Continue metoprolol titrate 25 mg 1/2 tablet twice daily on low salt diet. Continue  monitoring BP regularly. Eye exam is current.  Orders: -     TSH; Future  Hand tingling We discussed possible etiologies. History and examination do not suggest a serious process. Most likely caused by carpal tunnel syndrome, we discussed diagnosis, prognosis, and treatment options. For now recommend night left wrist splint. If problem is persistent or gets worse, sports medicine evaluation will be considered.  -     TSH; Future -     Vitamin B12; Future  COPD mixed type [facility]) Assessment & Plan: We discussed the appropriate technique for inhaler use. Recommend using a spacer with albuterol inhaler.  He is not longer on ICS/LABA. Continue Spiriva Respimat 2.5 mcg 2 puff daily. Following with pulmonologist.  Orders: -     Spacer/Aero-Holding Chambers; To use with inhalers  Dispense: 1 Device; Refill: 0  Anxiety disorder, unspecified type Assessment & Plan: Problem is adequately controlled. Continue Celexa 10 mg daily and alprazolam 1 mg 1/2 tablet twice daily as needed. Follow-up in 6 months.   Chronic cough Assessment & Plan: He noticed some improvement with gabapentin 300 mg at bedtime, he is no longer waking up with cough. Recommend trying to wall benzonatate 100 mg, which he is still taking daily, and monitor for changes in cough.  If he determines that benzonatate is also helping with daytime cough, he can resume at 100 mg daily. He also follows with pulmonologist.   Varicose veins of both  lower extremities, unspecified whether complicated Assessment & Plan: We discussed diagnosis, prognosis, and treatment options. For now he would like to hold on vascular evaluation. Recommend trying compression stockings. Continue adequate skin care.   I spent a total of 46 minutes in both face to face and non face to face activities for this visit on the date of this encounter. During this time history was obtained and documented, examination was performed, prior labs/imaging  reviewed, and assessment/plan discussed.  Return in about 6 months (around 10/13/2023) for chronic problems.  Gwyn Hieronymus G. [place], MD  [facility]. [place] office.

## 2023-04-12 ENCOUNTER — Ambulatory Visit: Payer: Medicare Other | Admitting: Family Medicine

## 2023-04-12 ENCOUNTER — Encounter: Payer: Self-pay | Admitting: Family Medicine

## 2023-04-12 VITALS — BP 110/70 | HR 91 | Resp 16 | Ht 68.0 in | Wt 208.0 lb

## 2023-04-12 DIAGNOSIS — R202 Paresthesia of skin: Secondary | ICD-10-CM

## 2023-04-12 DIAGNOSIS — R053 Chronic cough: Secondary | ICD-10-CM | POA: Diagnosis not present

## 2023-04-12 DIAGNOSIS — J449 Chronic obstructive pulmonary disease, unspecified: Secondary | ICD-10-CM | POA: Diagnosis not present

## 2023-04-12 DIAGNOSIS — E7800 Pure hypercholesterolemia, unspecified: Secondary | ICD-10-CM | POA: Diagnosis not present

## 2023-04-12 DIAGNOSIS — R7303 Prediabetes: Secondary | ICD-10-CM | POA: Diagnosis not present

## 2023-04-12 DIAGNOSIS — I10 Essential (primary) hypertension: Secondary | ICD-10-CM

## 2023-04-12 DIAGNOSIS — I8393 Asymptomatic varicose veins of bilateral lower extremities: Secondary | ICD-10-CM | POA: Diagnosis not present

## 2023-04-12 DIAGNOSIS — D692 Other nonthrombocytopenic purpura: Secondary | ICD-10-CM | POA: Diagnosis not present

## 2023-04-12 DIAGNOSIS — F419 Anxiety disorder, unspecified: Secondary | ICD-10-CM | POA: Diagnosis not present

## 2023-04-12 DIAGNOSIS — I714 Abdominal aortic aneurysm, without rupture, unspecified: Secondary | ICD-10-CM | POA: Diagnosis not present

## 2023-04-12 LAB — HEMOGLOBIN A1C: Hgb A1c MFr Bld: 6.3 % (ref 4.6–6.5)

## 2023-04-12 LAB — LIPID PANEL
Cholesterol: 162 mg/dL (ref 0–200)
HDL: 70.2 mg/dL (ref >39.00)
LDL Cholesterol: 76 mg/dL (ref 0–99)
NonHDL: 91.62
Total CHOL/HDL Ratio: 2
Triglycerides: 79 mg/dL (ref 0.0–149.0)
VLDL: 15.8 mg/dL (ref 0.0–40.0)

## 2023-04-12 LAB — TSH: TSH: 2.05 u[IU]/mL (ref 0.35–5.50)

## 2023-04-12 LAB — VITAMIN B12: Vitamin B-12: 576 pg/mL (ref 211–911)

## 2023-04-12 MED ORDER — SPACER/AERO-HOLDING CHAMBERS DEVI
0 refills | Status: AC
Start: 2023-04-12 — End: ?

## 2023-04-12 NOTE — Assessment & Plan Note (Signed)
He noticed some improvement with gabapentin 300 mg at bedtime, he is no longer waking up with cough. Recommend trying to wall benzonatate 100 mg, which he is still taking daily, and monitor for changes in cough.  If he determines that benzonatate is also helping with daytime cough, he can resume at 100 mg daily. He also follows with pulmonologist.

## 2023-04-12 NOTE — Assessment & Plan Note (Signed)
We discussed diagnosis, prognosis, and treatment options. For now he would like to hold on vascular evaluation. Recommend trying compression stockings. Continue adequate skin care.

## 2023-04-12 NOTE — Assessment & Plan Note (Signed)
BP adequately controlled. Continue metoprolol titrate 25 mg 1/2 tablet twice daily on low salt diet. Continue monitoring BP regularly. Eye exam is current.

## 2023-04-12 NOTE — Assessment & Plan Note (Signed)
Encouraged a healthy lifestyle for diabetes prevention. Last hemoglobin A1c 6.0 in 04/2022. Further recommendation will be given according to hemoglobin A1c result.

## 2023-04-12 NOTE — Assessment & Plan Note (Addendum)
Last abdomen/pelvis CT done on 08/14/2022, reported stable infrarenal abdominal aortic aneurysm. Abdominal CTA in 01/2022. He follows with cardiothoracic surgeon, Dr Laneta Simmers, last visit on 01/28/2022.

## 2023-04-12 NOTE — Assessment & Plan Note (Signed)
Currently on atorvastatin 20 mg daily. Continue low-fat diet. Last LDL 121 in 12/2021. Further recommendation will be given according to lipid panel result.

## 2023-04-12 NOTE — Assessment & Plan Note (Signed)
Problem is adequately controlled. Continue Celexa 10 mg daily and alprazolam 1 mg 1/2 tablet twice daily as needed. Follow-up in 6 months.

## 2023-04-12 NOTE — Patient Instructions (Signed)
A few things to remember from today's visit:  Hypercholesterolemia - Plan: Lipid panel  Essential hypertension, benign - Plan: TSH  Prediabetes - Plan: Hemoglobin A1c  Hand tingling - Plan: TSH, Vitamin B12  COPD mixed type (HCC) - Plan: Spacer/Aero-Holding Rudean Curt  Anxiety disorder, unspecified type  Try to hold Benzonatate for a few days and monitor for changes in cough. Use a spacer with the inhalers. Rest no changes.  If you need refills for medications you take chronically, please call your pharmacy. Do not use My Chart to request refills or for acute issues that need immediate attention. If you send a my chart message, it may take a few days to be addressed, specially if I am not in the office.  Please be sure medication list is accurate. If a new problem present, please set up appointment sooner than planned today.

## 2023-04-12 NOTE — Assessment & Plan Note (Addendum)
Reassured. Having periodic CBC is at his oncologist's office.

## 2023-04-12 NOTE — Assessment & Plan Note (Signed)
We discussed the appropriate technique for inhaler use. Recommend using a spacer with albuterol inhaler.  He is not longer on ICS/LABA. Continue Spiriva Respimat 2.5 mcg 2 puff daily. Following with pulmonologist.

## 2023-04-13 DIAGNOSIS — J9611 Chronic respiratory failure with hypoxia: Secondary | ICD-10-CM | POA: Diagnosis not present

## 2023-04-19 ENCOUNTER — Other Ambulatory Visit: Payer: Self-pay | Admitting: Family Medicine

## 2023-04-19 DIAGNOSIS — R600 Localized edema: Secondary | ICD-10-CM

## 2023-04-28 ENCOUNTER — Other Ambulatory Visit: Payer: Self-pay | Admitting: Family Medicine

## 2023-04-28 DIAGNOSIS — F418 Other specified anxiety disorders: Secondary | ICD-10-CM

## 2023-04-29 DIAGNOSIS — R21 Rash and other nonspecific skin eruption: Secondary | ICD-10-CM | POA: Diagnosis not present

## 2023-05-09 NOTE — Progress Notes (Unsigned)
Patient Care Team: [place], [name], MD as PCP - General (Family Medicine) Croitoru, Rachelle Hora, MD as PCP - Cardiology (Cardiology) Verner Chol, [facility] (Inactive) as Pharmacist (Pharmacist) Malachy Mood, MD as Attending Physician (Hematology and Oncology)   CHIEF COMPLAINT: Follow up prostate cancer   Oncology History  Malignant neoplasm of prostate (HCC)  02/23/2014 Initial Diagnosis   Malignant neoplasm of prostate      CURRENT THERAPY:  Eligard 22.5 mg every 3 months.    Xtandi 160 mg daily started on June 15, 2022  INTERVAL HISTORY Tony [name] returns for follow up as scheduled, last seen by me 02/18/23 and received another Eligard injection. He has fatigue for 3-4 days after, rests more but recovers well. He continues Xtandi, tolerating well. Main side effect from his treatment is hot flashes. Denies new pain or any other concerning changes.    ROS  All other systems reviewed and negative  Past Medical History:  Diagnosis Date   Anxiety    Arthritis    COPD (chronic obstructive pulmonary disease) (HCC)    Coronary artery disease    Dyspnea    Gastric outlet obstruction 02/03/2018   [name] 02/03/2018   GERD (gastroesophageal reflux disease)    History of hiatal hernia    small/notes 02/03/2018   Hypertension    no meds   Presence of permanent cardiac pacemaker    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   Gleason 4+5=9, PSA 15.65   Radiation    Sinus problem    SSS (sick sinus syndrome) (HCC)      Past Surgical History:  Procedure Laterality Date   APPENDECTOMY  2019   hx hematoma s/p appendectomy   CHOLECYSTECTOMY N/A 05/15/2022   Procedure: LAPAROSCOPIC CHOLECYSTECTOMY;  Surgeon: Fritzi Mandes, MD;  Location: [facility];  Service: General;  Laterality: N/A;   ESOPHAGOGASTRODUODENOSCOPY (EGD) WITH PROPOFOL N/A 02/03/2018   Procedure: ESOPHAGOGASTRODUODENOSCOPY (EGD) WITH PROPOFOL;  Surgeon: Vida Rigger, MD;  Location: [facility] ENDOSCOPY;  Service: Endoscopy;  Laterality: N/A;    FRACTURE SURGERY     INCISIONAL HERNIA REPAIR N/A 10/14/2022   Procedure: INCISIONAL HERNIA REPAIR WITH MESH;  Surgeon: Fritzi Mandes, MD;  Location: [facility];  Service: General;  Laterality: N/A;   INSERT / REPLACE / REMOVE PACEMAKER  06/14/2018   LAPAROSCOPIC APPENDECTOMY N/A 01/21/2018   Procedure: APPENDECTOMY LAPAROSCOPIC;  Surgeon: Rodman Pickle, MD;  Location: [facility];  Service: General;  Laterality: N/A;   PACEMAKER IMPLANT N/A 06/14/2018   Procedure: PACEMAKER IMPLANT - Dual Chamber;  Surgeon: Thurmon Fair, MD;  Location: [facility] INVASIVE CV [facility];  Service: Cardiovascular;  Laterality: N/A;   PROSTATE BIOPSY  02/08/14   gleason 4+5=9, 12/12 cores positive, 54 gm   RADIOACTIVE SEED IMPLANT N/A 06/29/2014   Procedure: RADIOACTIVE SEED IMPLANT;  Surgeon: Valetta Fuller, MD;  Location: [facility];  Service: Urology;  Laterality: N/A;   WRIST FRACTURE SURGERY Right 1980s     Outpatient Encounter Medications as of 05/11/2023  Medication Sig   acetaminophen (TYLENOL) 500 MG tablet Take 2 tablets (1,000 mg total) by mouth 3 (three) times daily.   albuterol (VENTOLIN HFA) 108 (90 Base) MCG/ACT inhaler TAKE 2 PUFFS BY MOUTH EVERY 6 HOURS AS NEEDED FOR WHEEZE OR SHORTNESS OF BREATH   ALPRAZolam (XANAX) 1 MG tablet TAKE 1/2 TABLET TWICE DAILY AS NEEDED   aspirin EC 81 MG tablet Take 81 mg by mouth daily.   atorvastatin (LIPITOR) 20 MG tablet TAKE 1 TABLET BY  MOUTH DAILY   benzonatate (TESSALON) 100 MG capsule TAKE 1 CAPSULE BY MOUTH EVERY DAY AS NEEDED   citalopram (CELEXA) 10 MG tablet TAKE 1 TABLET BY MOUTH DAILY   diclofenac Sodium (VOLTAREN) 1 % GEL AAPLY 4 GRAMS TOPICALLY 4 TIMES DAILY (Patient taking differently: Apply 1 Application topically 4 (four) times daily as needed (pain).)   enzalutamide (XTANDI) 40 MG tablet Take 4 tablets (160 mg total) by mouth daily.   fluticasone (FLONASE) 50 MCG/ACT nasal spray PLACE 1 SPRAY INTO BOTH NOSTRILS DAILY AS NEEDED FOR ALLERGIES OR  RHINITIS (AT BEDTIME).   furosemide (LASIX) 20 MG tablet TAKE 1 TABLET BY MOUTH DAILY   gabapentin (NEURONTIN) 300 MG capsule Take 1 capsule (300 mg total) by mouth at bedtime.   Melatonin 10 MG CAPS Take 20 mg by mouth at bedtime.   metoprolol tartrate (LOPRESSOR) 25 MG tablet Take 0.5 tablets (12.5 mg total) by mouth 2 (two) times daily.   Multiple Vitamin (MULTIVITAMIN WITH MINERALS) TABS tablet Take 0.5 tablets by mouth daily.   omeprazole (PRILOSEC) 20 MG capsule TAKE 1 CAPSULE BY MOUTH DAILY  BEFORE BREAKFAST   Spacer/Aero-Holding Chambers DEVI To use with inhalers   Tiotropium Bromide Monohydrate (SPIRIVA RESPIMAT) 2.5 MCG/ACT AERS Inhale 2 puffs into the lungs daily.   No facility-administered encounter medications on file as of 05/11/2023.     Today's Vitals   05/11/23 1032 05/11/23 1041  BP: 127/87   Pulse: 77   Resp: 18   Temp: 98.1 F (36.7 C)   TempSrc: Oral   SpO2: 97%   Weight: 199 lb 3.2 oz (90.4 kg)   Height: 5\' 8"  (1.727 m)   PainSc:  0-No pain   Body mass index is 30.29 kg/m.   PHYSICAL EXAM GENERAL:alert, no distress and comfortable SKIN: no rash  EYES: sclera clear LUNGS: clear with normal breathing effort HEART: regular rate & rhythm, no lower extremity edema ABDOMEN: abdomen soft, non-tender and normal bowel sounds NEURO: alert & oriented x 3 with fluent speech   CBC    Component Value Date/Time   WBC 7.7 05/11/2023 1001   WBC 5.6 10/08/2022 1044   RBC 5.11 05/11/2023 1001   HGB 14.4 05/11/2023 1001   HGB 14.9 05/15/2021 0927   HGB 15.8 08/27/2017 0915   HCT 42.4 05/11/2023 1001   HCT 42.9 05/15/2021 0927   HCT 48.5 08/27/2017 0915   PLT 263 05/11/2023 1001   PLT 196 05/15/2021 0927   MCV 83.0 05/11/2023 1001   MCV 89 05/15/2021 0927   MCV 97.0 08/27/2017 0915   MCH 28.2 05/11/2023 1001   MCHC 34.0 05/11/2023 1001   RDW 15.9 (H) 05/11/2023 1001   RDW 11.8 05/15/2021 0927   RDW 13.4 08/27/2017 0915   LYMPHSABS 1.3 05/11/2023 1001    LYMPHSABS 0.8 (L) 08/27/2017 0915   MONOABS 0.8 05/11/2023 1001   MONOABS 0.3 08/27/2017 0915   EOSABS 0.0 05/11/2023 1001   EOSABS 0.1 08/27/2017 0915   BASOSABS 0.1 05/11/2023 1001   BASOSABS 0.0 08/27/2017 0915     CMP     Component Value Date/Time   NA 137 05/11/2023 1001   NA 139 02/03/2019 0857   NA 141 08/27/2017 0915   K 4.1 05/11/2023 1001   K 4.8 08/27/2017 0915   CL 103 05/11/2023 1001   CO2 23 05/11/2023 1001   CO2 26 08/27/2017 0915   GLUCOSE 128 (H) 05/11/2023 1001   GLUCOSE 108 08/27/2017 0915   BUN 22 05/11/2023 1001  BUN 13 02/03/2019 0857   BUN 16.2 08/27/2017 0915   CREATININE 1.15 05/11/2023 1001   CREATININE 1.07 07/31/2020 1325   CREATININE 0.9 08/27/2017 0915   CALCIUM 9.8 05/11/2023 1001   CALCIUM 9.8 08/27/2017 0915   PROT 6.9 05/11/2023 1001   PROT 7.3 08/27/2017 0915   ALBUMIN 4.2 05/11/2023 1001   ALBUMIN 4.1 08/27/2017 0915   AST 18 05/11/2023 1001   AST 23 08/27/2017 0915   ALT 13 05/11/2023 1001   ALT 17 08/27/2017 0915   ALKPHOS 129 (H) 05/11/2023 1001   ALKPHOS 83 08/27/2017 0915   BILITOT 1.1 05/11/2023 1001   BILITOT 0.95 08/27/2017 0915   GFRNONAA >60 05/11/2023 1001   GFRNONAA 66 07/31/2020 1325   GFRAA 77 07/31/2020 1325     ASSESSMENT & PLAN:Tony [name] is a 82 y.o. male with    Malignant neoplasm of prostate; initially [id number] diagnosed in 2015, with Gleason score 4+5 = 9 and a PSA 15.7, biochemical relapse in 2017  -Diagnosed 2015, s/p radiation therapy with external beam and seed implant brachytherapy boost completed in October 2015 -He received 1 year androgen deprivation in 2016. -Due to biochemical relapse in 2017, he re-started ADT q3 months -Zytiga with prednisone started in August 2017, discontinued in October 2023 because of insurance coverage. -He was switched to Xtandi 160 mg daily in October 2023 -Last CT scan in 08/2022 showed no evidence of metastasis. PSA remains undetectable -Genetic testing  12/17/2022 is negative -Tony [name] appears stable, tolerating treatment well with fatigue and hot flashes. SE's are manageable. He is able to recover and function well with adequate PS. No clinical evidence of disease progression -Weight down 9 lbs from last visit, but stable this year. - Labs reviewed, PSA is pending; adequate to continue Xtandi daily and q3 month Eligard which he will receive today -F/up and inj in 3 months    PLAN: -Labs reviewed, PSA is pending -Continue Xtandi daily and q3 month Eligard - given today -F/up and inj in 3 months     All questions were answered. The patient knows to call the clinic with any problems, questions or concerns. No barriers to learning were detected.   Santiago Glad, NP-C 05/11/2023

## 2023-05-11 ENCOUNTER — Other Ambulatory Visit: Payer: Self-pay | Admitting: Family Medicine

## 2023-05-11 ENCOUNTER — Inpatient Hospital Stay: Payer: Medicare Other

## 2023-05-11 ENCOUNTER — Inpatient Hospital Stay: Payer: Medicare Other | Attending: Hematology

## 2023-05-11 ENCOUNTER — Inpatient Hospital Stay: Payer: Medicare Other | Admitting: Nurse Practitioner

## 2023-05-11 ENCOUNTER — Encounter: Payer: Self-pay | Admitting: Nurse Practitioner

## 2023-05-11 VITALS — BP 127/87 | HR 77 | Temp 98.1°F | Resp 18 | Ht 68.0 in | Wt 199.2 lb

## 2023-05-11 DIAGNOSIS — E669 Obesity, unspecified: Secondary | ICD-10-CM

## 2023-05-11 DIAGNOSIS — C61 Malignant neoplasm of prostate: Secondary | ICD-10-CM | POA: Diagnosis present

## 2023-05-11 DIAGNOSIS — R053 Chronic cough: Secondary | ICD-10-CM

## 2023-05-11 DIAGNOSIS — R232 Flushing: Secondary | ICD-10-CM | POA: Insufficient documentation

## 2023-05-11 DIAGNOSIS — Z5111 Encounter for antineoplastic chemotherapy: Secondary | ICD-10-CM | POA: Insufficient documentation

## 2023-05-11 LAB — CMP (CANCER CENTER ONLY)
ALT: 13 U/L (ref 0–44)
AST: 18 U/L (ref 15–41)
Albumin: 4.2 g/dL (ref 3.5–5.0)
Alkaline Phosphatase: 129 U/L — ABNORMAL HIGH (ref 38–126)
Anion gap: 11 (ref 5–15)
BUN: 22 mg/dL (ref 8–23)
CO2: 23 mmol/L (ref 22–32)
Calcium: 9.8 mg/dL (ref 8.9–10.3)
Chloride: 103 mmol/L (ref 98–111)
Creatinine: 1.15 mg/dL (ref 0.61–1.24)
GFR, Estimated: >60 mL/min (ref >60)
Glucose, Bld: 128 mg/dL — ABNORMAL HIGH (ref 70–99)
Potassium: 4.1 mmol/L (ref 3.5–5.1)
Sodium: 137 mmol/L (ref 135–145)
Total Bilirubin: 1.1 mg/dL (ref 0.3–1.2)
Total Protein: 6.9 g/dL (ref 6.5–8.1)

## 2023-05-11 LAB — CBC WITH DIFFERENTIAL (CANCER CENTER ONLY)
Abs Immature Granulocytes: 0.03 10*3/uL (ref 0.00–0.07)
Basophils Absolute: 0.1 10*3/uL (ref 0.0–0.1)
Basophils Relative: 1 %
Eosinophils Absolute: 0 10*3/uL (ref 0.0–0.5)
Eosinophils Relative: 0 %
HCT: 42.4 % (ref 39.0–52.0)
Hemoglobin: 14.4 g/dL (ref 13.0–17.0)
Immature Granulocytes: 0 %
Lymphocytes Relative: 17 %
Lymphs Abs: 1.3 10*3/uL (ref 0.7–4.0)
MCH: 28.2 pg (ref 26.0–34.0)
MCHC: 34 g/dL (ref 30.0–36.0)
MCV: 83 fL (ref 80.0–100.0)
Monocytes Absolute: 0.8 10*3/uL (ref 0.1–1.0)
Monocytes Relative: 11 %
Neutro Abs: 5.5 10*3/uL (ref 1.7–7.7)
Neutrophils Relative %: 71 %
Platelet Count: 263 10*3/uL (ref 150–400)
RBC: 5.11 MIL/uL (ref 4.22–5.81)
RDW: 15.9 % — ABNORMAL HIGH (ref 11.5–15.5)
WBC Count: 7.7 10*3/uL (ref 4.0–10.5)
nRBC: 0 % (ref 0.0–0.2)

## 2023-05-11 MED ORDER — LEUPROLIDE ACETATE (3 MONTH) 22.5 MG ~~LOC~~ KIT
22.5000 mg | PACK | Freq: Once | SUBCUTANEOUS | Status: AC
  Administered 2023-05-11: 22.5 mg via SUBCUTANEOUS
  Filled 2023-05-11: qty 22.5

## 2023-05-11 NOTE — Patient Instructions (Signed)

## 2023-05-12 ENCOUNTER — Telehealth: Payer: Self-pay | Admitting: Hematology

## 2023-05-12 LAB — PROSTATE-SPECIFIC AG, SERUM (LABCORP): Prostate Specific Ag, Serum: <0.1 ng/mL (ref 0.0–4.0)

## 2023-05-13 DIAGNOSIS — J449 Chronic obstructive pulmonary disease, unspecified: Secondary | ICD-10-CM | POA: Diagnosis not present

## 2023-06-10 ENCOUNTER — Other Ambulatory Visit: Payer: Self-pay | Admitting: Family Medicine

## 2023-06-10 DIAGNOSIS — F064 Anxiety disorder due to known physiological condition: Secondary | ICD-10-CM

## 2023-06-11 NOTE — Telephone Encounter (Signed)
Last OV 04/12/23 Last filled 05/13/23

## 2023-06-15 ENCOUNTER — Ambulatory Visit (INDEPENDENT_AMBULATORY_CARE_PROVIDER_SITE_OTHER): Payer: Medicare Other

## 2023-06-15 DIAGNOSIS — I495 Sick sinus syndrome: Secondary | ICD-10-CM | POA: Diagnosis not present

## 2023-06-15 LAB — CUP PACEART REMOTE DEVICE CHECK
Battery Remaining Longevity: 107 mo
Battery Voltage: 3 V
Brady Statistic AP VP Percent: 0.07 %
Brady Statistic AP VS Percent: 71.55 %
Brady Statistic AS VP Percent: 0.02 %
Brady Statistic AS VS Percent: 28.37 %
Brady Statistic RA Percent Paced: 70.87 %
Brady Statistic RV Percent Paced: 0.09 %
Date Time Interrogation Session: 20241001074710
Implantable Lead Connection Status: 753985
Implantable Lead Connection Status: 753985
Implantable Lead Implant Date: 20191001
Implantable Lead Implant Date: 20191001
Implantable Lead Location: 753859
Implantable Lead Location: 753860
Implantable Lead Model: 5076
Implantable Lead Model: 5076
Implantable Pulse Generator Implant Date: 20191001
Lead Channel Impedance Value: 304 Ohm
Lead Channel Impedance Value: 323 Ohm
Lead Channel Impedance Value: 399 Ohm
Lead Channel Impedance Value: 494 Ohm
Lead Channel Pacing Threshold Amplitude: 0.375 V
Lead Channel Pacing Threshold Amplitude: 0.875 V
Lead Channel Pacing Threshold Pulse Width: 0.4 ms
Lead Channel Pacing Threshold Pulse Width: 0.4 ms
Lead Channel Sensing Intrinsic Amplitude: 2.5 mV
Lead Channel Sensing Intrinsic Amplitude: 2.5 mV
Lead Channel Sensing Intrinsic Amplitude: 3.375 mV
Lead Channel Sensing Intrinsic Amplitude: 3.375 mV
Lead Channel Setting Pacing Amplitude: 1.5 V
Lead Channel Setting Pacing Amplitude: 2.5 V
Lead Channel Setting Pacing Pulse Width: 0.4 ms
Lead Channel Setting Sensing Sensitivity: 0.9 mV
Zone Setting Status: 755011
Zone Setting Status: 755011

## 2023-06-22 ENCOUNTER — Other Ambulatory Visit: Payer: Self-pay | Admitting: Family Medicine

## 2023-06-22 DIAGNOSIS — R053 Chronic cough: Secondary | ICD-10-CM

## 2023-06-23 ENCOUNTER — Other Ambulatory Visit: Payer: Self-pay | Admitting: Hematology

## 2023-06-23 DIAGNOSIS — C61 Malignant neoplasm of prostate: Secondary | ICD-10-CM

## 2023-06-29 ENCOUNTER — Telehealth: Payer: Self-pay | Admitting: Family Medicine

## 2023-06-29 ENCOUNTER — Other Ambulatory Visit: Payer: Self-pay | Admitting: Family Medicine

## 2023-06-29 DIAGNOSIS — F064 Anxiety disorder due to known physiological condition: Secondary | ICD-10-CM

## 2023-06-29 DIAGNOSIS — R053 Chronic cough: Secondary | ICD-10-CM

## 2023-06-29 MED ORDER — OMEPRAZOLE 20 MG PO CPDR: 20.0000 mg | DELAYED_RELEASE_CAPSULE | Freq: Every day | ORAL | 3 refills | Status: DC

## 2023-06-29 MED ORDER — BENZONATATE 100 MG PO CAPS: 100.0000 mg | ORAL_CAPSULE | Freq: Every day | ORAL | 1 refills | Status: DC | PRN

## 2023-06-29 MED ORDER — GABAPENTIN 300 MG PO CAPS
300.0000 mg | ORAL_CAPSULE | Freq: Every day | ORAL | 3 refills | Status: DC
Start: 2023-06-29 — End: 2024-05-08

## 2023-06-29 NOTE — Progress Notes (Signed)
Remote pacemaker transmission.   

## 2023-06-29 NOTE — Telephone Encounter (Signed)
Wife called back to say the tessalon Rx should say 2 pills a day. She is asking if MD could please amend Rx and resend?

## 2023-06-29 NOTE — Telephone Encounter (Signed)
Rx's sent. Xanax Rx was sent 05/2023 with refills.

## 2023-06-29 NOTE — Telephone Encounter (Addendum)
Prescription Request  06/29/2023  LOV: 04/12/2023  What is the name of the medication or equipment? Benzonatate, Gabapentin, Omeprazole, Xanax  ALPRAZolam (XANAX) 1 MG tablet benzonatate (TESSALON) 100 MG capsule - Asking if MD could please amend Rx to say 2 pills a day? gabapentin (NEURONTIN) 300 MG capsule omeprazole (PRILOSEC) 20 MG capsule  Have you contacted your pharmacy to request a refill? Yes   Which pharmacy would you like this sent to?   [facility] [phone] - [place], [place] - [address] [place] [address] Phone: [phone] Fax: [phone]   Patient notified that their request is being sent to the clinical staff for review and that they should receive a response within 2 business days.   Please advise at Mobile [phone] (mobile)

## 2023-06-30 NOTE — Telephone Encounter (Signed)
Recommend not to take it more than once per day. If cough is getting worse, arrange appointment with his pulmonologist. Thanks, BJ

## 2023-07-05 ENCOUNTER — Telehealth: Payer: Self-pay | Admitting: Pharmacy Technician

## 2023-07-05 ENCOUNTER — Other Ambulatory Visit (HOSPITAL_COMMUNITY): Payer: Self-pay

## 2023-07-05 ENCOUNTER — Encounter: Payer: Self-pay | Admitting: Hematology

## 2023-07-05 NOTE — Telephone Encounter (Signed)
Oral Oncology Patient Advocate Encounter  Was successful in securing patient a $8,000 grant from [employer] to provide copayment coverage for [place].  This will keep the out of pocket expense at $0.     [employer] ID: [id number]  I have spoken with the patient.   The billing information is as follows and has been shared with [facility].    RxBin: [id number] PCN: [id number] Member ID: [id number] Group ID: [id number] Dates of Eligibility: 06/05/23 through 06/03/24  Fund:  Prostate  Jinger Neighbors, CPhT-Adv Oncology Pharmacy Patient Advocate [facility] Direct Number: [phone]  Fax: [phone]

## 2023-07-23 NOTE — Progress Notes (Unsigned)
HPI M former smoker followed for OSA, COPD, hx Respiratory Failure, dCHF w pulmonary edema, hx E.Coli Bacteremia, CAD, Chronic AFib/ Pacemaker, Prostate Cancer, Aortic Aneurysm HST 09/02/20- AHI 40.8/ hr, desaturation to 66%/ avbg 88%, body weight 228 lbs Pending CPAP titration w O2 check on 2/22 PFT 09/09/20- Minimal obstruction CPAP titration 11/05/20- to BIPAP 25/21 and required O2 3l Walk Test on Room Air- O2 qualifying 02/16/22- min sat 87% after 2 laps, Max HR 92 (paced).  =====================================================================    03/22/23-  81 yoM former smoker followed for OSA, COPD, hx Respiratory Failure, CAD, dCHF w pulmonary edema, hx E.Coli Bacteremia, Chronic AFib/ Pacemaker, Prostate Cancer,  Celiac Stenosis, Obesity, Covid pneumonia April2023, Cholelithiasis, Aortic aneurysms followed by Dr Laneta Simmers -Spriva 2.5, Proair hfa    [facility]una BiPAP 14/8 PS 6, with O2 3L for sleep/ [employer] Download-compliance  23.3% (many short and missed nights). AHI 2.2/ hr Body weight today 208 lbs Comes today with daughter.  Download reviewed. He still tends to take his mask off during the night if he starts coughing.  They indicate that coughing at night was a bigger problem during the Spring weather. He seems to be using inhalers as directed, but can't confirm which is which. They will bring them next visit. I need to see these to consider if we can do better about his cough.  CXR 10/27/22- IMPRESSION: 1. No active cardiopulmonary disease. No evidence of pneumonia or pulmonary edema. 2. Stable cardiomegaly. 3. Chronic bronchitic changes and/or chronic interstitial lung disease.  07/26/23-  81 yoM former smoker followed for OSA, COPD, hx Respiratory Failure, CAD, dCHF w pulmonary edema, hx E.Coli Bacteremia, Chronic AFib/ Pacemaker, Prostate Cancer,  Celiac Stenosis, Obesity, Covid pneumonia April2023, Cholelithiasis, Aortic aneurysms followed by Dr Laneta Simmers -Symbicort 160 (1 puff bid), Proair  hfa    Luna BiPAP([employer]) Min EPAP 8, Max IPAP 20 , avg 15/9 PS 6, with O2 3L for sleep/ [employer] Download-compliance  33.3%, AHI 1.4/hr Body weight today 210 lbs                                Wife here Download reviewed- many short nights.  Flu shot tomorrow at PCP. Takes CPAP off for bathroom and forgets to put it on. Dicussed. Good control when used. Brought inhalers. Using Symbicort 160 1 puff twice daily (2 puffs makes him dizzy). Albuterol occasionally for coughing.   ROS-see HPI   + = positive Constitutional:    weight loss, night sweats, fevers, chills, fatigue, lassitude. HEENT:    headaches, difficulty swallowing, tooth/dental problems, sore throat,       sneezing, itching, ear ache, nasal congestion, post nasal drip, snoring CV:    chest pain, orthopnea, PND, swelling in lower extremities, anasarca,                                   dizziness, +palpitations Resp:   +shortness of breath with exertion or at rest.                productive cough,   +non-productive cough, coughing up of blood.              change in color of mucus.  wheezing.   Skin:    rash or lesions. GI:  + heartburn, indigestion, abdominal pain, nausea, vomiting, diarrhea,  change in bowel habits, loss of appetite GU: dysuria, change in color of urine, no urgency or frequency.   flank pain. MS:   joint pain, stiffness, decreased range of motion, back pain. Neuro-     nothing unusual Psych:  change in mood or affect.  depression or +anxiety.   memory loss.  OBJ- Physical Exam General- Alert, Oriented, Affect-appropriate, Distress- none acute, + obese Skin- rash-none, lesions- none, excoriation- none Lymphadenopathy- none Head- atraumatic            Eyes- Gross vision intact, PERRLA, conjunctivae and secretions clear            Ears- Hearing, canals-normal            Nose- Clear, no-Septal dev, mucus, polyps, erosion, perforation             Throat- Mallampati IV , mucosa clear ,  drainage- none, tonsils- atrophic, + teeth Neck- flexible , trachea midline, no stridor , thyroid nl, carotid no bruit Chest - symmetrical excursion , unlabored           Heart/CV- RRR , no murmur , no gallop  , no rub, nl s1 s2                           - JVD- none , edema+trace, stasis changes- none, varices- none           Lung- +  unlabored, wheeze-none, cough- none , dullness-none, rub- none           Chest wall- +pacemaker L  Abd-  Br/ Gen/ Rectal- Not done, not indicated Extrem- cyanosis- none, clubbing, none, atrophy- none, strength- nl, + cane Neuro- grossly intact to observation

## 2023-07-24 ENCOUNTER — Other Ambulatory Visit: Payer: Self-pay | Admitting: Internal Medicine

## 2023-07-24 DIAGNOSIS — J439 Emphysema, unspecified: Secondary | ICD-10-CM

## 2023-07-26 ENCOUNTER — Encounter: Payer: Self-pay | Admitting: Internal Medicine

## 2023-07-26 ENCOUNTER — Ambulatory Visit: Payer: Medicare Other | Admitting: Internal Medicine

## 2023-07-26 VITALS — BP 138/87 | HR 83 | Temp 97.6°F | Ht 67.0 in | Wt 210.4 lb

## 2023-07-26 DIAGNOSIS — J9611 Chronic respiratory failure with hypoxia: Secondary | ICD-10-CM | POA: Diagnosis not present

## 2023-07-26 DIAGNOSIS — J439 Emphysema, unspecified: Secondary | ICD-10-CM | POA: Diagnosis not present

## 2023-07-26 DIAGNOSIS — G4733 Obstructive sleep apnea (adult) (pediatric): Secondary | ICD-10-CM | POA: Diagnosis not present

## 2023-07-26 MED ORDER — ALBUTEROL SULFATE HFA 108 (90 BASE) MCG/ACT IN AERS: INHALATION_SPRAY | RESPIRATORY_TRACT | 12 refills | Status: DC

## 2023-07-26 MED ORDER — BUDESONIDE-FORMOTEROL FUMARATE 160-4.5 MCG/ACT IN AERO: INHALATION_SPRAY | RESPIRATORY_TRACT | 6 refills | Status: DC

## 2023-07-26 NOTE — Assessment & Plan Note (Signed)
Benefits from O2 3L for sleep

## 2023-07-26 NOTE — Assessment & Plan Note (Signed)
Benefits when used with good control. Still working to improve compliance. Plan- continue BIPAP

## 2023-07-26 NOTE — Patient Instructions (Signed)
Try to use the CPAP machine as much as you can- all night every night is our goal.  Refill sent for your albuterol rescue inhaler

## 2023-07-26 NOTE — Progress Notes (Signed)
ACUTE VISIT Chief Complaint  Patient presents with   Otalgia    Right ear   HPI: Tony [name] is a 82 y.o. 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  He complains of intermittent right ear pain for about a month. He believes it is getting better.   Otalgia  The current episode started 1 to 4 weeks ago. The problem occurs every few hours. The problem has been gradually improving. There has been no fever. The pain is moderate. Associated symptoms include coughing. Pertinent negatives include no abdominal pain, diarrhea, ear discharge, headaches, neck pain, rash, rhinorrhea, sore throat or vomiting.   He has been using a medication for ear wax.  He denies any hearing changes.  No recent travel or URI.  -Also c/o pruritic "spot" on lower back for the past few days, occasionally upper back. Improved. Patient complains of a spot on his back, but there is no lesion on physical examination.  Has used Cortizone cream. No new medication, detergent,or soap.   Mentions cough, which is chronic.  COPD: Follows with pulmonologist, who he saw yesterday. Cough has been going on for over a year.   He is currently on benzonatate 100 mg daily prn and gabapentin 300 mg daily.   His wife mentions that he has been anxious, talkative. He doe snot feels like anxiety is getting worse.He states that he likes to talk. No hallucinations or depressed mood.:  He is currently on Celexa 10 mg daily and alprazolam 0.5 mg 1/2 tab bid prn.   Review of Systems  Constitutional:  Positive for fatigue. Negative for activity change, appetite change, chills and fever.  HENT:  Positive for ear pain. Negative for ear discharge, nosebleeds, rhinorrhea and sore throat.   Respiratory:  Positive for cough. Negative for shortness of  breath and wheezing.   Cardiovascular:  Negative for chest pain.  Gastrointestinal:  Negative for abdominal pain, diarrhea and vomiting.  Genitourinary:  Negative for decreased urine volume, dysuria and hematuria.  Musculoskeletal:  Positive for arthralgias. Negative for neck pain.  Skin:  Negative for rash.  Neurological:  Negative for syncope and headaches.  Psychiatric/Behavioral:  Negative for confusion.   See other pertinent positives and negatives in HPI.  Current Outpatient Medications on File Prior to Visit  Medication Sig Dispense Refill   acetaminophen (TYLENOL) 500 MG tablet Take 2 tablets (1,000 mg total) by mouth 3 (three) times daily. 30 tablet 0   albuterol (VENTOLIN HFA) 108 (90 Base) MCG/ACT inhaler TAKE 2 PUFFS BY MOUTH EVERY 6 HOURS AS NEEDED FOR WHEEZE OR SHORTNESS OF BREATH 8.5 each 12   ALPRAZolam (XANAX) 1 MG tablet TAKE 1/2 TABLET TWICE DAILY AS NEEDED. NEXT REFILL 03/17/23 30 tablet 2   aspirin EC 81 MG tablet Take 81 mg by mouth daily.     atorvastatin (LIPITOR) 20 MG tablet TAKE 1 TABLET BY MOUTH DAILY 90 tablet 3   benzonatate (TESSALON) 100 MG capsule Take 1 capsule (100 mg total) by mouth daily as needed for cough. 30 capsule 1   budesonide-formoterol (SYMBICORT) 160-4.5 MCG/ACT inhaler 1 puff then rinse mouth, twice daily 10.2 g 6   citalopram (CELEXA) 10 MG tablet TAKE 1 TABLET BY MOUTH DAILY 100 tablet 1   diclofenac Sodium (VOLTAREN) 1 % GEL AAPLY 4 GRAMS TOPICALLY 4 TIMES DAILY (Patient  taking differently: Apply 1 Application topically 4 (four) times daily as needed (pain).) 400 g 2   fluticasone (FLONASE) 50 MCG/ACT nasal spray PLACE 1 SPRAY INTO BOTH NOSTRILS DAILY AS NEEDED FOR ALLERGIES OR RHINITIS (AT BEDTIME). 48 mL 1   furosemide (LASIX) 20 MG tablet TAKE 1 TABLET BY MOUTH DAILY 100 tablet 2   gabapentin (NEURONTIN) 300 MG capsule Take 1 capsule (300 mg total) by mouth at bedtime. 30 capsule 3   Melatonin 10 MG CAPS Take 20 mg by mouth at bedtime.      metoprolol tartrate (LOPRESSOR) 25 MG tablet Take 0.5 tablets (12.5 mg total) by mouth 2 (two) times daily. 180 tablet 3   Multiple Vitamin (MULTIVITAMIN WITH MINERALS) TABS tablet Take 0.5 tablets by mouth daily.     omeprazole (PRILOSEC) 20 MG capsule Take 1 capsule (20 mg total) by mouth daily. 100 capsule 3   Spacer/Aero-Holding Chambers DEVI To use with inhalers 1 Device 0   XTANDI 40 MG tablet Take 4 tablets (160 mg total) by mouth daily. 120 tablet 1   No current facility-administered medications on file prior to visit.    Past Medical History:  Diagnosis Date   Anxiety    Arthritis    COPD (chronic obstructive pulmonary disease) (HCC)    Coronary artery disease    Dyspnea    Gastric outlet obstruction 02/03/2018   [name] 02/03/2018   GERD (gastroesophageal reflux disease)    History of hiatal hernia    small/notes 02/03/2018   Hypertension    no meds   Presence of permanent cardiac pacemaker    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   Gleason 4+5=9, PSA 15.65   Radiation    Sinus problem    SSS (sick sinus syndrome) (HCC)    Allergies  Allergen Reactions   Penicillins Rash    Has patient had a PCN reaction causing immediate rash, facial/tongue/throat swelling, SOB or lightheadedness with hypotension: Yes Has patient had a PCN reaction causing severe rash involving mucus membranes or skin necrosis: No Has patient had a PCN reaction that required hospitalization: No Has patient had a PCN reaction occurring within the last 10 years: No If all of the above answers are "NO", then may proceed with Cephalosporin use.    Social History   Socioeconomic History   Marital status: Married    Spouse name: Not on file   Number of children: 3   Years of education: Not on file   Highest education level: Not on file  Occupational History   Occupation: Estate agent    Employer: [employer]    Comment: retired   Occupation: Production designer, theatre/television/film    Comment: [place]ired  Tobacco Use    Smoking status: Former    Types: Cigars    Quit date: 09/14/2012    Years since quitting: 10.8   Smokeless tobacco: Never   Tobacco comments:    little cigars; the small ones"  Vaping Use   Vaping status: Never Used  Substance and Sexual Activity   Alcohol use: Not Currently    Alcohol/week: 14.0 standard drinks of alcohol    Types: 14 Cans of beer per week    Comment: quit in 2000   Drug use: No   Sexual activity: Not Currently  Other Topics Concern   Not on file  Social History Narrative   Lives with wife and 2 dogs in one level home; daughter and her family co-habitate.   Has three daughters, all in [place], supportive. Five grandchildren.  Wants to return to doing silver sneakers once gym re-opens.         Social Determinants of Health   Financial Resource Strain: Low Risk  (07/29/2022)   Overall Financial Resource Strain (CARDIA)    Difficulty of Paying Living Expenses: Not hard at all  Recent Concern: Financial Resource Strain - Medium Risk (05/27/2022)   Overall Financial Resource Strain (CARDIA)    Difficulty of Paying Living Expenses: Somewhat hard  Food Insecurity: No Food Insecurity (10/15/2022)   Hunger Vital Sign    Worried About Running Out of Food in the Last Year: Never true    Ran Out of Food in the Last Year: Never true  Transportation Needs: No Transportation Needs (10/15/2022)   PRAPARE - Administrator, Civil Service (Medical): No    Lack of Transportation (Non-Medical): No  Physical Activity: Insufficiently Active (07/29/2022)   Exercise Vital Sign    Days of Exercise per Week: 5 days    Minutes of Exercise per Session: 20 min  Stress: No Stress Concern Present (07/29/2022)   [employer] of Occupational Health - Occupational Stress Questionnaire    Feeling of Stress : Not at all  Social Connections: Socially Integrated (07/29/2022)   Social Connection and Isolation Panel [NHANES]    Frequency of Communication with Friends and Family: More  than three times a week    Frequency of Social Gatherings with Friends and Family: More than three times a week    Attends Religious Services: More than 4 times per year    Active Member of [employer] or Organizations: Yes    Attends Banker Meetings: More than 4 times per year    Marital Status: Married   Vitals:   07/27/23 0821  BP: 128/80  Pulse: 82  Resp: 16  SpO2: 98%   Body mass index is 32.89 kg/m.  Physical Exam Vitals and nursing note reviewed.  Constitutional:      General: He is not in acute distress.    Appearance: He is well-developed.  HENT:     Head: Normocephalic and atraumatic.     Right Ear: Tympanic membrane and external ear normal.     Left Ear: Tympanic membrane, ear canal and external ear normal.     Ears:     Comments: Right ear canal:Mild tenderness during otoscopic exam. No erythema or edema.    Mouth/Throat:     Dentition: Has dentures.     Pharynx: No posterior oropharyngeal erythema.  Eyes:     Conjunctiva/sclera: Conjunctivae normal.  Cardiovascular:     Rate and Rhythm: Normal rate and regular rhythm.     Heart sounds: No murmur heard. Pulmonary:     Effort: Pulmonary effort is normal. No respiratory distress.     Breath sounds: Normal breath sounds.  Lymphadenopathy:     Cervical: No cervical adenopathy.  Skin:    General: Skin is warm.     Findings: No erythema or rash.     Comments: On area of concerned no erythema or dryness. Scattered SK on upper and lower back. Ecchymosis on forearms and telangiectasis on mid back.  Neurological:     Mental Status: He is alert and oriented to person, place, and time.     Comments: Antalgic gait assisted with a cane.  Psychiatric:        Mood and Affect: Mood and affect normal.   ASSESSMENT AND PLAN:  Mr. Parkhouse was seen today for a right ear ache  Earache  on right No significant findings on examination today, no erythema but mild tenderness with otoscopic exam. Recommend topical  antibiotic treatment. Instructed to stop OTC ceruminolytic med, he has no cerumen and this could irritate skin in ear canal. Instructed about warning signs.   -     Neomycin-Polymyxin-HC; Place 3 drops into the right ear 3 (three) times daily for 10 days.  Dispense: 10 mL; Refill: 0  Seborrheic keratosis We discussed Dx,prognosis,and treatment options. Continue monitoring for changes.  Pruritus of skin Possible etiologies discussed, no rash appreciated on examination today, Daily moisturizer: Eucerin and Cetaphil. Continue Cortizone cream , small amount 2-3 times per day as needed.  Anxiety disorder, unspecified type Problem seems to be stable. I do not recommend increasing Alprazolam. If needed in the future celexa can be increased but I do not think it is necessary at this time. We discussed some side effects of medications.  Need for influenza vaccination -     Flu Vaccine Trivalent High Dose (Fluad)  Return if symptoms worsen or fail to improve, for keep next appointment.  I, Rolla Etienne Wierda, acting as a scribe for Jushua Waltman [place], MD., have documented all relevant documentation on the behalf of Zeenat Jeanbaptiste [place], MD, as directed by  Teisha Trowbridge [place], MD while in the presence of Hannelore Bova [place], MD.   I, Shamere Campas [place], MD, have reviewed all documentation for this visit. The documentation on 07/31/23 for the exam, diagnosis, procedures, and orders are all accurate and complete.  Gricel Copen G. [place], MD  [facility] [place]. [facility].

## 2023-07-27 ENCOUNTER — Ambulatory Visit: Payer: Medicare Other

## 2023-07-27 ENCOUNTER — Ambulatory Visit (INDEPENDENT_AMBULATORY_CARE_PROVIDER_SITE_OTHER): Payer: Medicare Other | Admitting: Family Medicine

## 2023-07-27 ENCOUNTER — Telehealth: Payer: Self-pay | Admitting: Family Medicine

## 2023-07-27 ENCOUNTER — Encounter: Payer: Self-pay | Admitting: Family Medicine

## 2023-07-27 VITALS — BP 128/80 | HR 82 | Resp 16 | Ht 67.0 in | Wt 210.0 lb

## 2023-07-27 DIAGNOSIS — F419 Anxiety disorder, unspecified: Secondary | ICD-10-CM | POA: Diagnosis not present

## 2023-07-27 DIAGNOSIS — L821 Other seborrheic keratosis: Secondary | ICD-10-CM | POA: Diagnosis not present

## 2023-07-27 DIAGNOSIS — Z23 Encounter for immunization: Secondary | ICD-10-CM

## 2023-07-27 DIAGNOSIS — L299 Pruritus, unspecified: Secondary | ICD-10-CM | POA: Diagnosis not present

## 2023-07-27 DIAGNOSIS — H9201 Otalgia, right ear: Secondary | ICD-10-CM | POA: Diagnosis not present

## 2023-07-27 MED ORDER — NEOMYCIN-POLYMYXIN-HC 3.5-10000-1 OT SOLN: 3.0000 [drp] | Freq: Three times a day (TID) | OTIC | 0 refills | Status: AC

## 2023-07-27 NOTE — Patient Instructions (Addendum)
A few things to remember from today's visit:  Earache on right - Plan: neomycin-polymyxin-hydrocortisone (CORTISPORIN) OTIC solution  Need for influenza vaccination - Plan: Flu Vaccine Trivalent High Dose (Fluad)  Seborrheic keratosis  Pruritus of skin  Anxiety disorder, unspecified type Ear drops in right ear for 7-10 days. Rest with no changes.  If you need refills for medications you take chronically, please call your pharmacy. Do not use My Chart to request refills or for acute issues that need immediate attention. If you send a my chart message, it may take a few days to be addressed, specially if I am not in the office.  Please be sure medication list is accurate. If a new problem present, please set up appointment sooner than planned today.

## 2023-07-27 NOTE — Telephone Encounter (Signed)
Pt needs to r/s telephone AWV appointment --he needs to have it back to back with his wife.

## 2023-07-28 NOTE — Telephone Encounter (Signed)
Schedule 09/14/23

## 2023-08-01 NOTE — Assessment & Plan Note (Signed)
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 -Due to biochemical relapse in 2017, he re-started ADT -Zytiga with prednisone started in August 2017, discontinued in October 2023 because of insurance coverage. -He was switched to Xtandi 160 mg daily in October 2023 -He has responded very well so far, PSA undetectable. -Last CT scan in May and December 2023 showed no evidence of metastasis.

## 2023-08-01 NOTE — Progress Notes (Signed)
Patient Care Team: [place], [name], MD as PCP - General (Family Medicine) Croitoru, Rachelle Hora, MD as PCP - Cardiology (Cardiology) Verner Chol, [facility] (Inactive) as Pharmacist (Pharmacist) Malachy Mood, MD as Attending Physician (Hematology and Oncology)  Clinic Day:  08/08/2023  Referring physician: [place], [name], MD  ASSESSMENT & PLAN:   Assessment & Plan: Malignant neoplasm of prostate [facility]) -initially [id number] diagnosed in 2015, with Gleason score 4+5 = 9 and a PSA 15.7  -Status post radiation therapy with external beam and seed implant brachytherapy boost completed in October 2015 -He received 1 year androgen deprivation in 2016. -Due to biochemical relapse in 2017, he re-started ADT -Zytiga with prednisone started in August 2017, discontinued in October 2023 because of insurance coverage. -He was switched to Xtandi 160 mg daily in October 2023 -He has responded very well so far, PSA undetectable. -Last CT scan in May and December 2023 showed no evidence of metastasis.   Plan: Labs reviewed  -CBC showing WBC 5.3; Hgb 13.1; Hct 40.7; Plt 253; Anc 3.2 -CMP - K 4.4; glucose 111; BUN 15; Creatinine 0.91; eGFR > 60; Ca 9.6; LFTs normal -PSA pending. -Patient already taking gabapentin at night to help with insomnia.  Makes him very sleepy.  Recommend continued exercise with stress ball to strengthen hands and fingers. -Continue Xtandi daily -proceed with leuprolide injection today. -Labs and follow-up in 3 months with next leuprolide injection.  The patient understands the plans discussed today and is in agreement with them.  He knows to contact our office if he develops concerns prior to his next appointment.  I provided 25 minutes of face-to-face time during this encounter and > 50% was spent counseling as documented under my assessment and plan.    Carlean Jews, NP  [facility] - A DEPT OF [facility] [address] [place] [address] Dept: [phone] Dept Fax: [phone]   No orders of the defined types were placed in this encounter.     CHIEF COMPLAINT:  CC: malignant neoplasm of prostate   Current Treatment:  Eligard  22.5 mg every 3 months  Xtandi 100 mg  daily started on June 15, 2022 INTERVAL HISTORY:  Tyus is here today for repeat clinical assessment. He was last  seen by Clayborn Heron, NP on 05/11/2023.  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  Most severe at night.  Already taking gabapentin to help with insomnia.  Makes him very sleepy.  Able to take Tylenol to help with pain.  Reports strength and range of motion exercises helps reduce pain and neuropathy in hands and fingers.  He denies chest pain, chest pressure, or shortness of breath. He denies headaches or visual disturbances. He denies abdominal pain, nausea, vomiting, or changes in bowel or bladder habits.  He denies fevers or chills. His appetite is good. His weight has increased 6 pounds over last month .  I have reviewed the past medical history, past surgical history, social history and family history with the patient and they are unchanged from previous note.  ALLERGIES:  is allergic to penicillins.  MEDICATIONS:  Current Outpatient Medications  Medication Sig Dispense Refill   acetaminophen (TYLENOL) 500 MG tablet Take 2 tablets (1,000 mg total) by mouth 3 (three) times daily. 30 tablet 0   albuterol (VENTOLIN HFA) 108 (90 Base) MCG/ACT inhaler TAKE 2 PUFFS BY MOUTH EVERY 6 HOURS AS NEEDED FOR WHEEZE OR SHORTNESS OF  BREATH 8.5 each 12   ALPRAZolam (XANAX) 1 MG tablet TAKE 1/2 TABLET TWICE DAILY AS NEEDED. NEXT REFILL 03/17/23 30 tablet 2   aspirin EC 81 MG tablet Take 81 mg by mouth daily.     atorvastatin (LIPITOR) 20 MG tablet TAKE 1 TABLET BY MOUTH DAILY 90 tablet 3   benzonatate (TESSALON) 100 MG capsule Take 1 capsule (100 mg total) by mouth daily as needed for cough. 30 capsule 1    budesonide-formoterol (SYMBICORT) 160-4.5 MCG/ACT inhaler 1 puff then rinse mouth, twice daily 10.2 g 6   citalopram (CELEXA) 10 MG tablet TAKE 1 TABLET BY MOUTH DAILY 100 tablet 1   diclofenac Sodium (VOLTAREN) 1 % GEL AAPLY 4 GRAMS TOPICALLY 4 TIMES DAILY (Patient taking differently: Apply 1 Application topically 4 (four) times daily as needed (pain).) 400 g 2   fluticasone (FLONASE) 50 MCG/ACT nasal spray PLACE 1 SPRAY INTO BOTH NOSTRILS DAILY AS NEEDED FOR ALLERGIES OR RHINITIS (AT BEDTIME). 48 mL 1   furosemide (LASIX) 20 MG tablet TAKE 1 TABLET BY MOUTH DAILY 100 tablet 2   gabapentin (NEURONTIN) 300 MG capsule Take 1 capsule (300 mg total) by mouth at bedtime. 30 capsule 3   Melatonin 10 MG CAPS Take 20 mg by mouth at bedtime.     metoprolol tartrate (LOPRESSOR) 25 MG tablet Take 0.5 tablets (12.5 mg total) by mouth 2 (two) times daily. 180 tablet 3   Multiple Vitamin (MULTIVITAMIN WITH MINERALS) TABS tablet Take 0.5 tablets by mouth daily.     omeprazole (PRILOSEC) 20 MG capsule Take 1 capsule (20 mg total) by mouth daily. 100 capsule 3   Spacer/Aero-Holding Chambers DEVI To use with inhalers 1 Device 0   XTANDI 40 MG tablet Take 4 tablets (160 mg total) by mouth daily. 120 tablet 1   No current facility-administered medications for this visit.    HISTORY OF PRESENT ILLNESS:   Oncology History  Malignant neoplasm of prostate (HCC)  02/23/2014 Initial Diagnosis   Malignant neoplasm of prostate       REVIEW OF SYSTEMS:   Constitutional: Denies fevers, chills or abnormal weight loss Eyes: Denies blurriness of vision Ears, nose, mouth, throat, and face: Denies mucositis or sore throat Respiratory: Denies cough, dyspnea or wheezes Cardiovascular: Denies palpitation, chest discomfort or lower extremity swelling Gastrointestinal:  Denies nausea, heartburn or change in bowel habits Skin: Denies abnormal skin rashes Lymphatics: Denies new lymphadenopathy or easy  bruising Neurological:Denies numbness, tingling or new weaknesses.  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 Behavioral/Psych: Mood is stable, no new changes.  Takes gabapentin to help with insomnia.  Prescribed by primary care provider. All other systems were reviewed with the patient and are negative.   VITALS:   Today's Vitals   08/03/23 1100 08/03/23 1107  BP:  (!) 141/85  Pulse:  69  Resp:  16  Temp:  97.8 F (36.6 C)  TempSrc:  Temporal  SpO2:  97%  Weight:  216 lb 3.2 oz (98.1 kg)  PainSc: 0-No pain    Body mass index is 33.86 kg/m.    Body mass index is 33.86 kg/m.  Performance status (ECOG): 1 - Symptomatic but completely ambulatory  PHYSICAL EXAM:   GENERAL:alert, no distress and comfortable SKIN: skin color, texture, turgor are normal, no rashes or significant lesions EYES: normal, Conjunctiva are pink and non-injected, sclera clear OROPHARYNX:no exudate, no erythema and lips, buccal mucosa, and tongue normal  NECK: supple, thyroid normal size, non-tender, without nodularity LYMPH:  no  palpable lymphadenopathy in the cervical, axillary or inguinal LUNGS: clear to auscultation and percussion with normal breathing effort HEART: regular rate & rhythm and no murmurs and no lower extremity edema ABDOMEN:abdomen soft, non-tender and normal bowel sounds Musculoskeletal:no cyanosis of digits and no clubbing  NEURO: alert & oriented x 3 with fluent speech, no focal motor/sensory deficits  LABORATORY DATA:  I have reviewed the data as listed    Component Value Date/Time   NA 139 08/03/2023 0955   NA 139 02/03/2019 0857   NA 141 08/27/2017 0915   K 4.4 08/03/2023 0955   K 4.8 08/27/2017 0915   CL 105 08/03/2023 0955   CO2 27 08/03/2023 0955   CO2 26 08/27/2017 0915   GLUCOSE 111 (H) 08/03/2023 0955   GLUCOSE 108 08/27/2017 0915   BUN 15 08/03/2023 0955   BUN 13 02/03/2019 0857   BUN 16.2 08/27/2017 0915   CREATININE 0.91 08/03/2023 0955   CREATININE  1.07 07/31/2020 1325   CREATININE 0.9 08/27/2017 0915   CALCIUM 9.6 08/03/2023 0955   CALCIUM 9.8 08/27/2017 0915   PROT 6.9 08/03/2023 0955   PROT 7.3 08/27/2017 0915   ALBUMIN 4.3 08/03/2023 0955   ALBUMIN 4.1 08/27/2017 0915   AST 22 08/03/2023 0955   AST 23 08/27/2017 0915   ALT 16 08/03/2023 0955   ALT 17 08/27/2017 0915   ALKPHOS 99 08/03/2023 0955   ALKPHOS 83 08/27/2017 0915   BILITOT 0.8 08/03/2023 0955   BILITOT 0.95 08/27/2017 0915   GFRNONAA >60 08/03/2023 0955   GFRNONAA 66 07/31/2020 1325   GFRAA 77 07/31/2020 1325   Lab Results  Component Value Date   WBC 5.3 08/03/2023   NEUTROABS 3.2 08/03/2023   HGB 13.1 08/03/2023   HCT 40.7 08/03/2023   MCV 86.2 08/03/2023   PLT 253 08/03/2023

## 2023-08-03 ENCOUNTER — Inpatient Hospital Stay: Payer: Medicare Other | Admitting: Nurse Practitioner

## 2023-08-03 ENCOUNTER — Inpatient Hospital Stay: Payer: Medicare Other

## 2023-08-03 ENCOUNTER — Inpatient Hospital Stay: Payer: Medicare Other | Attending: Hematology

## 2023-08-03 VITALS — BP 141/85 | HR 69 | Temp 97.8°F | Resp 16 | Wt 216.2 lb

## 2023-08-03 DIAGNOSIS — C61 Malignant neoplasm of prostate: Secondary | ICD-10-CM | POA: Diagnosis present

## 2023-08-03 DIAGNOSIS — Z5111 Encounter for antineoplastic chemotherapy: Secondary | ICD-10-CM | POA: Diagnosis not present

## 2023-08-03 DIAGNOSIS — E669 Obesity, unspecified: Secondary | ICD-10-CM

## 2023-08-03 LAB — CMP (CANCER CENTER ONLY)
ALT: 16 U/L (ref 0–44)
AST: 22 U/L (ref 15–41)
Albumin: 4.3 g/dL (ref 3.5–5.0)
Alkaline Phosphatase: 99 U/L (ref 38–126)
Anion gap: 7 (ref 5–15)
BUN: 15 mg/dL (ref 8–23)
CO2: 27 mmol/L (ref 22–32)
Calcium: 9.6 mg/dL (ref 8.9–10.3)
Chloride: 105 mmol/L (ref 98–111)
Creatinine: 0.91 mg/dL (ref 0.61–1.24)
GFR, Estimated: >60 mL/min (ref >60)
Glucose, Bld: 111 mg/dL — ABNORMAL HIGH (ref 70–99)
Potassium: 4.4 mmol/L (ref 3.5–5.1)
Sodium: 139 mmol/L (ref 135–145)
Total Bilirubin: 0.8 mg/dL (ref <1.2)
Total Protein: 6.9 g/dL (ref 6.5–8.1)

## 2023-08-03 LAB — CBC WITH DIFFERENTIAL (CANCER CENTER ONLY)
Abs Immature Granulocytes: 0.02 10*3/uL (ref 0.00–0.07)
Basophils Absolute: 0.1 10*3/uL (ref 0.0–0.1)
Basophils Relative: 1 %
Eosinophils Absolute: 0.2 10*3/uL (ref 0.0–0.5)
Eosinophils Relative: 3 %
HCT: 40.7 % (ref 39.0–52.0)
Hemoglobin: 13.1 g/dL (ref 13.0–17.0)
Immature Granulocytes: 0 %
Lymphocytes Relative: 24 %
Lymphs Abs: 1.3 10*3/uL (ref 0.7–4.0)
MCH: 27.8 pg (ref 26.0–34.0)
MCHC: 32.2 g/dL (ref 30.0–36.0)
MCV: 86.2 fL (ref 80.0–100.0)
Monocytes Absolute: 0.6 10*3/uL (ref 0.1–1.0)
Monocytes Relative: 11 %
Neutro Abs: 3.2 10*3/uL (ref 1.7–7.7)
Neutrophils Relative %: 61 %
Platelet Count: 253 10*3/uL (ref 150–400)
RBC: 4.72 MIL/uL (ref 4.22–5.81)
RDW: 16.3 % — ABNORMAL HIGH (ref 11.5–15.5)
WBC Count: 5.3 10*3/uL (ref 4.0–10.5)
nRBC: 0 % (ref 0.0–0.2)

## 2023-08-03 MED ORDER — LEUPROLIDE ACETATE (3 MONTH) 22.5 MG ~~LOC~~ KIT
22.5000 mg | PACK | Freq: Once | SUBCUTANEOUS | Status: AC
  Administered 2023-08-03: 22.5 mg via SUBCUTANEOUS
  Filled 2023-08-03: qty 22.5

## 2023-08-04 LAB — PROSTATE-SPECIFIC AG, SERUM (LABCORP): Prostate Specific Ag, Serum: <0.1 ng/mL (ref 0.0–4.0)

## 2023-08-08 ENCOUNTER — Encounter: Payer: Self-pay | Admitting: Hematology

## 2023-08-08 ENCOUNTER — Encounter: Payer: Self-pay | Admitting: Nurse Practitioner

## 2023-08-10 ENCOUNTER — Telehealth: Payer: Self-pay | Admitting: Internal Medicine

## 2023-08-10 NOTE — Telephone Encounter (Signed)
Pt needs a qualifying walk test

## 2023-08-13 ENCOUNTER — Other Ambulatory Visit: Payer: Self-pay | Admitting: Family Medicine

## 2023-08-25 ENCOUNTER — Telehealth: Payer: Self-pay | Admitting: Cardiovascular Disease

## 2023-08-25 ENCOUNTER — Other Ambulatory Visit (HOSPITAL_COMMUNITY): Payer: Self-pay

## 2023-08-25 NOTE — Telephone Encounter (Signed)
Per Wife pt is having tingling in both hands for about two weeks. Please advise.

## 2023-08-25 NOTE — Telephone Encounter (Signed)
Patient identification verified by 2 forms. Tony Rail, RN    Called and spoke to patient wife [name] states:   -patient has tingling in tips of fingers/hand   -tingling is constant   -on going for 3 weeks   -no other symptoms   -spoke to PCP last month   -unsure if tingling related to pace maker, has had pace maker for a few years   -request follow up appointment with Dr. [name] in January Patient denies:   -Numbness   -weakness   -chest chest pain  Informed [name]:  -best to follow up with PCP regarding tingling in hands  -message sent to Dr. [name] RN for assistance with scheduling  [name] has no further questions at this time

## 2023-08-25 NOTE — Telephone Encounter (Signed)
I tried calling the pt and there was no answer and no option to leave [employer] like he only uses o2 at night with sleep  He will need ov to determine if needs daytime o2  Will try calling him later

## 2023-08-26 NOTE — Telephone Encounter (Signed)
Wife stated she is returning RN Britany's call.

## 2023-08-26 NOTE — Telephone Encounter (Signed)
Spoke with pt wife, Follow up scheduled  

## 2023-08-26 NOTE — Telephone Encounter (Signed)
 Attempted to call patient, no answer left message requesting a call back.

## 2023-09-01 ENCOUNTER — Other Ambulatory Visit: Payer: Self-pay | Admitting: Family Medicine

## 2023-09-01 ENCOUNTER — Other Ambulatory Visit: Payer: Self-pay

## 2023-09-01 DIAGNOSIS — F064 Anxiety disorder due to known physiological condition: Secondary | ICD-10-CM

## 2023-09-01 DIAGNOSIS — C61 Malignant neoplasm of prostate: Secondary | ICD-10-CM

## 2023-09-01 MED ORDER — ENZALUTAMIDE 40 MG PO TABS
160.0000 mg | ORAL_TABLET | Freq: Every day | ORAL | 1 refills | Status: DC
  Filled 2023-09-06 – 2023-09-13 (×2): qty 120, 30d supply, fill #0
  Filled 2023-10-11: qty 120, 30d supply, fill #1

## 2023-09-01 NOTE — Telephone Encounter (Signed)
Copied from CRM [id number]. Topic: Clinical - Medication Refill >> Sep 01, 2023  8:32 AM Isabell A wrote: Most Recent Primary Care Visit:  Provider: [place], [name]  Department: [facility]  Visit Type: OFFICE VISIT  Date: 07/27/2023  Medication: ALPRAZolam Prudy Feeler) 1 MG tablet  Has the patient contacted their pharmacy? Yes (Agent: If no, request that the patient contact the pharmacy for the refill. If patient does not wish to contact the pharmacy document the reason why and proceed with request.) (Agent: If yes, when and what did the pharmacy advise?)  Is this the correct pharmacy for this prescription? Yes If no, delete pharmacy and type the correct one.  This is the patient's preferred pharmacy:  [id number] - [place], [place] - [address] [place] [address] Phone: [phone] Fax: [phone]   Has the prescription been filled recently? Yes  Is the patient out of the medication? Yes  Has the patient been seen for an appointment in the last year OR does the patient have an upcoming appointment? Yes  Can we respond through MyChart? No  Agent: Please be advised that Rx refills may take up to 3 business days. We ask that you follow-up with your pharmacy.

## 2023-09-03 ENCOUNTER — Other Ambulatory Visit: Payer: Self-pay | Admitting: Family Medicine

## 2023-09-03 NOTE — Telephone Encounter (Unsigned)
Copied from CRM [id number]. Topic: Clinical - Medication Refill >> Sep 03, 2023  9:28 AM Elizebeth Brooking wrote: Most Recent Primary Care Visit:  Provider: [place]az[place], [name]  Department: [facility]  Visit Type: OFFICE VISIT  Date: 07/27/2023  Medication: ***  Has the patient contacted their pharmacy? {yes/no:20286} (Agent: If no, request that the patient contact the pharmacy for the refill. If patient does not wish to contact the pharmacy document the reason why and proceed with request.) (Agent: If yes, when and what did the pharmacy advise?)  Is this the correct pharmacy for this prescription? {yes/no:20286} If no, delete pharmacy and type the correct one.  This is the patient's preferred pharmacy:  [phone] - [place], [place] - [address] [place] [address] Phone: [phone] Fax: [phone]  [place] [facility] [address] [place] [address] Phone: [phone] Fax: [phone]  [facility] [address] [place] [address] Phone: [phone] Fax: [phone]  [facility] - [place], [place] - [address] [place] [address] Phone: [phone] Fax: [phone]  [facility] [address] [place] - [address] [place] [address] [place] [address] [place] [address] Phone: [phone] Fax: [phone]  [facility] - [place], [place] - [address] [place]  [address] Phone: [phone] Fax: [phone]  [facility] - [place], [place] - [address] [place] [address] Phone: [phone] Fax: [phone]   Has the prescription been filled recently? {yes/no:20286}  Is the patient out of the medication? {yes/no:20286}  Has the patient been seen for an appointment in the last year OR does the patient  have an upcoming appointment? {yes/no:20286}  Can we respond through MyChart? {yes/no:20286}  Agent: Please be advised that Rx refills may take up to 3 business days. We ask that you follow-up with your pharmacy.

## 2023-09-06 ENCOUNTER — Other Ambulatory Visit: Payer: Self-pay | Admitting: Pharmacy Technician

## 2023-09-06 ENCOUNTER — Other Ambulatory Visit: Payer: Self-pay

## 2023-09-06 ENCOUNTER — Other Ambulatory Visit (HOSPITAL_COMMUNITY): Payer: Self-pay

## 2023-09-06 NOTE — Progress Notes (Signed)
Specialty Pharmacy Initiation Note   Tony [name] is a 82 y.o. male who will be followed by the specialty pharmacy service for RxSp Oncology    Review of administration, indication, effectiveness, safety, potential side effects, storage/disposable, and missed dose instructions occurred today for patient's specialty medication(s) Enzalutamide [name]     Patient/Caregiver did not have any additional questions or concerns.   Patient's therapy is appropriate to: Continue    Goals Addressed             This Visit's Progress    Slow Disease Progression       Patient is on track. Patient will maintain adherence         Sherry Ruffing, PharmD, BCPS, [facility] Hematology/Oncology Clinical Pharmacist [facility] Oral Chemotherapy [facility] [phone] 09/06/2023 10:31 AM

## 2023-09-06 NOTE — Progress Notes (Signed)
Specialty Pharmacy Initial Fill Coordination Note  Tony [name] is a 82 y.o. male contacted today regarding refills of specialty medication(s) Enzalutamide [name] .  Patient requested Delivery  on 09/24/23  to verified address [address]   [place] [address]   Medication will be filled on 09/22/23.   Patient is aware of $0 copayment.

## 2023-09-09 ENCOUNTER — Ambulatory Visit: Payer: Medicare Other | Attending: Cardiovascular Disease | Admitting: Cardiovascular Disease

## 2023-09-09 ENCOUNTER — Encounter: Payer: Self-pay | Admitting: Cardiovascular Disease

## 2023-09-09 VITALS — BP 102/70 | HR 64 | Ht 68.0 in | Wt 211.0 lb

## 2023-09-09 DIAGNOSIS — G4733 Obstructive sleep apnea (adult) (pediatric): Secondary | ICD-10-CM

## 2023-09-09 DIAGNOSIS — E7800 Pure hypercholesterolemia, unspecified: Secondary | ICD-10-CM

## 2023-09-09 DIAGNOSIS — R7303 Prediabetes: Secondary | ICD-10-CM | POA: Diagnosis not present

## 2023-09-09 DIAGNOSIS — I7121 Aneurysm of the ascending aorta, without rupture: Secondary | ICD-10-CM | POA: Diagnosis not present

## 2023-09-09 DIAGNOSIS — I495 Sick sinus syndrome: Secondary | ICD-10-CM | POA: Diagnosis not present

## 2023-09-09 DIAGNOSIS — I7143 Infrarenal abdominal aortic aneurysm, without rupture: Secondary | ICD-10-CM | POA: Diagnosis not present

## 2023-09-09 DIAGNOSIS — D518 Other vitamin B12 deficiency anemias: Secondary | ICD-10-CM

## 2023-09-09 DIAGNOSIS — I10 Essential (primary) hypertension: Secondary | ICD-10-CM | POA: Diagnosis not present

## 2023-09-09 DIAGNOSIS — Z950 Presence of cardiac pacemaker: Secondary | ICD-10-CM | POA: Diagnosis not present

## 2023-09-09 DIAGNOSIS — I2510 Atherosclerotic heart disease of native coronary artery without angina pectoris: Secondary | ICD-10-CM

## 2023-09-09 DIAGNOSIS — I452 Bifascicular block: Secondary | ICD-10-CM

## 2023-09-09 DIAGNOSIS — C61 Malignant neoplasm of prostate: Secondary | ICD-10-CM

## 2023-09-09 LAB — BASIC METABOLIC PANEL
BUN/Creatinine Ratio: 19 (ref 10–24)
BUN: 17 mg/dL (ref 8–27)
CO2: 20 mmol/L (ref 20–29)
Calcium: 9.8 mg/dL (ref 8.6–10.2)
Chloride: 102 mmol/L (ref 96–106)
Creatinine, Ser: 0.89 mg/dL (ref 0.76–1.27)
Glucose: 103 mg/dL — ABNORMAL HIGH (ref 70–99)
Potassium: 4.4 mmol/L (ref 3.5–5.2)
Sodium: 140 mmol/L (ref 134–144)
eGFR: 86 mL/min/{1.73_m2} (ref >59)

## 2023-09-09 LAB — VITAMIN B12: Vitamin B-12: 830 pg/mL (ref 232–1245)

## 2023-09-09 NOTE — Progress Notes (Signed)
Cardiology OfficeNote    Date:  09/09/2023   ID:  Tony [name], DOB July 15, 1941, MRN [id number]  PCP:  Swazil[place], [name], MD  Cardiologist:   Thurmon Fair, MD    Chief Complaint  Patient presents with   Pacemaker Check    History of Present Illness:  Tony [name] is a 82 y.o. 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 He received a dual-chamber permanent pacemaker for sinus bradycardia with chronotropic incompetence in October 2019 [employer] Azure), OSA, chronic respiratory failure with hypoxia following COVID-19 infection in April 2023.  Since his last appointment he has undergone cholecystectomy and repair of an umbilical hernia, without any cardiac problems.  The patient specifically denies any chest pain at rest or with exertion, dyspnea at rest , orthopnea, paroxysmal nocturnal dyspnea, syncope, palpitations, focal neurological deficits, intermittent claudication, lower extremity edema, unexplained weight gain, cough, hemoptysis or wheezing.  He has unchanged mild exertional dyspnea.  He denies falls or bleeding problems.  He complains of numbness in all 5 fingers of both hands in a pattern consistent with neuropathy.  He does not have similar symptoms in his feet.  His diabetes has generally been well-controlled.  He walks slowly, but not unsteadily.  He does not drink alcohol except very rarely.  Interestingly, he takes gabapentin at bedtime, but this was prescribed to help with sleep.  Pacemaker interrogation shows normal device function.  Presenting rhythm is atrial paced ventricular sensed.  He has 75 % atrial pacing with good heart rate histogram distribution and never requires ventricular pacing (0.2%).  He has an estimated 8.8 years of remaining generator longevity.  The device has recorded a  small handful of episodes of nonsustained ventricular rates, 6-15 beats in duration, all of them asymptomatic.  All the available electrograms appear to show atrial tachycardia with 1: 1 AV conduction, rather than true VT.  Activity level has been between 1.5 and 3 hours a day.  On Jan 28, 2022 he underwent follow-up CT angiography for his aortic aneurysms.  The ascending aortic aneurysm is virtually unchanged at 4.6 cm (4.5 cm in the 2022) and the abdominal aneurysm is unchanged at 4.2 cm in diameter.  He had another scan that imaged only the abdominal aneurysm in December 2023 when it was measured a little smaller at 4.0 cm.  Metabolic control is acceptable with a recent LDL of 76 and HDL of 70, hemoglobin A1c 4.0%.  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  His oncologist is Dr. Clelia Croft.  His PSA remains undetectable  He has incidentally discovered coronary artery calcification on chest CT.  In September 2017 his nuclear vasodilator stress test was a low risk study with a small/mild area of ischemia in the apex. EF 59%.   Past Medical History:  Diagnosis Date   Anxiety    Arthritis    COPD (chronic obstructive pulmonary disease) (HCC)    Coronary artery disease    Dyspnea    Gastric outlet obstruction 02/03/2018   [name] 02/03/2018   GERD (gastroesophageal reflux disease)    History of hiatal hernia    small/notes 02/03/2018   Hypertension    no meds   Presence of permanent cardiac pacemaker    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   Gleason 4+5=9, PSA 15.65   Radiation  Sinus problem    SSS (sick sinus syndrome) (HCC)     Past Surgical History:  Procedure Laterality Date   APPENDECTOMY  2019   hx hematoma s/p appendectomy   CHOLECYSTECTOMY N/A 05/15/2022   Procedure: LAPAROSCOPIC CHOLECYSTECTOMY;  Surgeon: Fritzi Mandes, MD;  Location: [facility];  Service: General;  Laterality: N/A;    ESOPHAGOGASTRODUODENOSCOPY (EGD) WITH PROPOFOL N/A 02/03/2018   Procedure: ESOPHAGOGASTRODUODENOSCOPY (EGD) WITH PROPOFOL;  Surgeon: Vida Rigger, MD;  [facility] END[facility];  Service: Endoscopy;  Laterality: N/A;   FRACTURE SURGERY     INCISIONAL HERNIA REPAIR N/A 10/14/2022   Procedure: INCISIONAL HERNIA REPAIR WITH MESH;  Surgeon: Fritzi Mandes, MD;  Location: [facility];  Service: General;  Laterality: N/A;   INSERT / REPLACE / REMOVE PACEMAKER  06/14/2018   LAPAROSCOPIC APPENDECTOMY N/A 01/21/2018   Procedure: APPENDECTOMY LAPAROSCOPIC;  Surgeon: Rodman Pickle, MD;  Location: [facility] OR;  Service: General;  Laterality: N/A;   PACEMAKER IMPLANT N/A 06/14/2018   Procedure: PACEMAKER IMPLANT - Dual Chamber;  Surgeon: Thurmon Fair, MD;  Location: [facility] INVASIVE CV LAB;  Service: Cardiovascular;  Laterality: N/A;   PROSTATE BIOPSY  02/08/14   gleason 4+5=9, 12/12 cores positive, 54 gm   RADIOACTIVE SEED IMPLANT N/A 06/29/2014   Procedure: RADIOACTIVE SEED IMPLANT;  Surgeon: Valetta Fuller, MD;  [facility]: [facility];  Service: Urology;  Laterality: N/A;   WRIST FRACTURE SURGERY Right 1980s    Current Medications: Outpatient Medications Prior to Visit  Medication Sig Dispense Refill   acetaminophen (TYLENOL) 500 MG tablet Take 2 tablets (1,000 mg total) by mouth 3 (three) times daily. 30 tablet 0   albuterol (VENTOLIN HFA) 108 (90 Base) MCG/ACT inhaler TAKE 2 PUFFS BY MOUTH EVERY 6 HOURS AS NEEDED FOR WHEEZE OR SHORTNESS OF BREATH 8.5 each 12   ALPRAZolam (XANAX) 1 MG tablet TAKE 1/2 TABLET TWICE DAILY AS NEEDED. NEXT REFILL 03/17/23 30 tablet 3   aspirin EC 81 MG tablet Take 81 mg by mouth daily.     atorvastatin (LIPITOR) 20 MG tablet TAKE 1 TABLET BY MOUTH ONCE  DAILY 100 tablet 2   benzonatate (TESSALON) 100 MG capsule Take 1 capsule (100 mg total) by mouth daily as needed for cough. 30 capsule 1   budesonide-formoterol (SYMBICORT) 160-4.5 MCG/ACT inhaler 1 puff then rinse  mouth, twice daily 10.2 g 6   citalopram (CELEXA) 10 MG tablet TAKE 1 TABLET BY MOUTH DAILY 100 tablet 1   diclofenac Sodium (VOLTAREN) 1 % GEL AAPLY 4 GRAMS TOPICALLY 4 TIMES DAILY (Patient taking differently: Apply 1 Application topically 4 (four) times daily as needed (pain).) 400 g 2   enzalutamide (XTANDI) 40 MG tablet Take 4 tablets (160 mg total) by mouth daily. 120 tablet 1   fluticasone (FLONASE) 50 MCG/ACT nasal spray PLACE 1 SPRAY INTO BOTH NOSTRILS DAILY AS NEEDED FOR ALLERGIES OR RHINITIS (AT BEDTIME). 48 mL 1   furosemide (LASIX) 20 MG tablet TAKE 1 TABLET BY MOUTH DAILY 100 tablet 2   gabapentin (NEURONTIN) 300 MG capsule Take 1 capsule (300 mg total) by mouth at bedtime. 30 capsule 3   Melatonin 10 MG CAPS Take 20 mg by mouth at bedtime.     metoprolol tartrate (LOPRESSOR) 25 MG tablet Take 0.5 tablets (12.5 mg total) by mouth 2 (two) times daily. 180 tablet 3   Multiple Vitamin (MULTIVITAMIN WITH MINERALS) TABS tablet Take 0.5 tablets by mouth daily.     omeprazole (PRILOSEC) 20 MG capsule Take 1  capsule (20 mg total) by mouth daily. 100 capsule 3   Spacer/Aero-Holding Rudean Curt To use with inhalers 1 Device 0   No facility-administered medications prior to visit.     Allergies:   Penicillins   Social History   Socioeconomic History   Marital status: Married    Spouse name: Not on file   Number of children: 3   Years of education: Not on file   Highest education level: Not on file  Occupational History   Occupation: Estate agent    Employer: [employer]    Comment: retired   Occupation: Production designer, theatre/television/film    Comment: [employer]-[place]ired  Tobacco Use   Smoking status: Former    Types: Cigars    Quit date: 09/14/2012    Years since quitting: 10.9   Smokeless tobacco: Never   Tobacco comments:    little cigars; the small ones"  Vaping Use   Vaping status: Never Used  Substance and Sexual Activity   Alcohol use: Not Currently    Alcohol/week: 14.0 standard drinks of  alcohol    Types: 14 Cans of beer per week    Comment: quit in 2000   Drug use: No   Sexual activity: Not Currently  Other Topics Concern   Not on file  Social History Narrative   Lives with wife and 2 dogs in one level home; daughter and her family co-habitate.   Has three daughters, all in [place], supportive. Five grandchildren.   Wants to return to doing silver sneakers once gym re-opens.         Social Drivers of Corporate investment banker Strain: Low Risk  (07/29/2022)   Overall Financial Resource Strain (CARDIA)    Difficulty of Paying Living Expenses: Not hard at all  Recent Concern: Financial Resource Strain - Medium Risk (05/27/2022)   Overall Financial Resource Strain (CARDIA)    Difficulty of Paying Living Expenses: Somewhat hard  Food Insecurity: No Food Insecurity (10/15/2022)   Hunger Vital Sign    Worried About Running Out of Food in the Last Year: Never true    Ran Out of Food in the Last Year: Never true  Transportation Needs: No Transportation Needs (10/15/2022)   PRAPARE - Administrator, Civil Service (Medical): No    Lack of Transportation (Non-Medical): No  Physical Activity: Insufficiently Active (07/29/2022)   Exercise Vital Sign    Days of Exercise per Week: 5 days    Minutes of Exercise per Session: 20 min  Stress: No Stress Concern Present (07/29/2022)   [employer] of Occupational Health - Occupational Stress Questionnaire    Feeling of Stress : Not at all  Social Connections: Socially Integrated (07/29/2022)   Social Connection and Isolation Panel [NHANES]    Frequency of Communication with Friends and Family: More than three times a week    Frequency of Social Gatherings with Friends and Family: More than three times a week    Attends Religious Services: More than 4 times per year    Active Member of [employer] or Organizations: Yes    Attends Engineer, structural: More than 4 times per year    Marital Status: Married      Family History:  The patient's family history includes Alzheimer's disease in his father and mother; Arthritis in his sister; Lung cancer in his brother; Prostate cancer in his brother, brother, father, and paternal grandfather; Stomach cancer in his brother.   ROS:   Please see the history of  present illness.    ROS All other systems are reviewed and are negative.  PHYSICAL EXAM:   VS:  BP 102/70   Pulse 64   Ht 5\' 8"  (1.727 m)   Wt 211 lb (95.7 kg)   SpO2 96%   BMI 32.08 kg/m      General: Alert, oriented x3, no distress, moderately obese.  Healthy left subclavian pacemaker site. Head: no evidence of trauma, PERRL, EOMI, no exophtalmos or lid lag, no myxedema, no xanthelasma; normal ears, nose and oropharynx Neck: normal jugular venous pulsations and no hepatojugular reflux; brisk carotid pulses without delay and no carotid bruits Chest: clear to auscultation, no signs of consolidation by percussion or palpation, normal fremitus, symmetrical and full respiratory excursions Cardiovascular: normal position and quality of the apical impulse, regular rhythm, normal first and second heart sounds, no murmurs, rubs or gallops Abdomen: no tenderness or distention, no masses by palpation, no abnormal pulsatility or arterial bruits, normal bowel sounds, no hepatosplenomegaly Extremities: no clubbing, cyanosis or edema; 2+ radial, ulnar and brachial pulses bilaterally; 2+ right femoral, posterior tibial and dorsalis pedis pulses; 2+ left femoral, posterior tibial and dorsalis pedis pulses; no subclavian or femoral bruits Neurological: grossly nonfocal Psych: Normal mood and affect   Wt Readings from Last 3 Encounters:  09/09/23 211 lb (95.7 kg)  08/03/23 216 lb 3.2 oz (98.1 kg)  07/27/23 210 lb (95.3 kg)      Studies/Labs Reviewed:   ECHO 07/14/2021:    1. Left ventricular ejection fraction, by estimation, is 60 to 65%. The  left ventricle has normal function. The left ventricle  has no regional  wall motion abnormalities. Left ventricular diastolic parameters were  normal.   2. Right ventricular systolic function is normal. The right ventricular  size is normal.   3. The mitral valve is normal in structure. No evidence of mitral valve  regurgitation. No evidence of mitral stenosis.   4. The aortic valve is calcified. Aortic valve regurgitation is trivial.  Mild aortic valve sclerosis is present, with no evidence of aortic valve  stenosis.   5. Aortic dilatation noted. There is mild dilatation of the aortic root,  measuring 40 mm. There is mild dilatation of the ascending aorta,  measuring 43 mm.   6. The inferior vena cava is normal in size with greater than 50%  respiratory variability, suggesting right atrial pressure of 3 mmHg.   EKG:    EKG Interpretation Date/Time:  Thursday September 09 2023 09:09:12 EST Ventricular Rate:  64 PR Interval:  264 QRS Duration:  154 QT Interval:  496 QTC Calculation: 511 R Axis:   -41  Text Interpretation: Atrial-paced rhythm with prolonged AV conduction Left axis deviation Right bundle branch block When compared with ECG of 24-Dec-2021 15:37, No significant change since last tracing Confirmed by Jazzman Loughmiller [phone]) on 09/09/2023 9:26:30 AM         Recent Labs: 04/12/2023: TSH 2.05 08/03/2023: ALT 16; BUN 15; Creatinine 0.91; Hemoglobin 13.1; Platelet Count 253; Potassium 4.4; Sodium 139   Lipid Panel    Component Value Date/Time   CHOL 162 04/12/2023 1056   TRIG 79.0 04/12/2023 1056   HDL 70.20 04/12/2023 1056   CHOLHDL 2 04/12/2023 1056   VLDL 15.8 04/12/2023 1056   LDLCALC 76 04/12/2023 1056   LDLCALC 71 05/21/2020 1007   ASSESSMENT:    1. Essential hypertension, benign   2. SSS (sick sinus syndrome) (HCC)   3. Pacemaker   4. Bifascicular block  5. Coronary artery disease involving native coronary artery of native heart without angina pectoris   6. Aneurysm of ascending aorta without rupture  (HCC)   7. Infrarenal abdominal aortic aneurysm (AAA) without rupture (HCC)   8. Hypercholesterolemia   9. Prediabetes   10. Malignant neoplasm of prostate (HCC)   11. OSA (obstructive sleep apnea)   12. Other vitamin B12 deficiency anemia      PLAN:  In order of problems listed above:  Sinus bradycardia/SSS: Heart rate histogram appears appropriate for his level of activity. PPM: Normal device function.  Continue remote downloads every 3 months. RBBB+LAFB+long PR: Thankfully he does not require ventricular pacing, even when taking beta-blockers. CAD: Has never had angina pectoris.  Low risk stress test 2017 but with a small area of ischemia.   He takes metoprolol which may be functioning as the only antianginal medications.  Invasive evaluation does not apper to be indicated at this time, but the focus remains on risk factor mitigation.   Ascending aortic aneurysm/AAA: Will recheck CT angiography of both the thoracic and abdominal pelvic aorta. HLP: Excellent HDL.  LDL close to target under 70. PreDM: Hemoglobin A1c 6.3% without medications HTN: Borderline low, without symptoms of hypotension.  Will continue metoprolol for its theoretical benefits for arrhythmia/aneurysm growth/angina prevention. Prostate Ca: S/P XRT: Remarkable response to Zytiga with undetectable PSA for 7 years now.  Roosvelt Maser was stopped in the fall 2023 because of insurance coverage. OSA: Compliant with CPAP, followed by Dr. Maple Hudson. Neuropathy: Symptoms distribution is not consistent with carpal tunnel syndrome.  No history of cervical spine disease.  Diabetes has not been poorly controlled to have caused neuropathy.  Check vitamin B12.  Not sure that his previous chemotherapy could be a cause.   Medication Adjustments/Labs and Tests Ordered: Current medicines are reviewed at length with the patient today.  Concerns regarding medicines are outlined above.  Medication changes, Labs and Tests ordered today are listed in  the Patient Instructions below.  Patient Instructions   Medication Instructions:  No changes *If you need a refill on your cardiac medications before your next appointment, please call your pharmacy*   Lab Work: B12, BMP If you have labs (blood work) drawn today and your tests are completely normal, you will receive your results only by: MyChart Message (if you have MyChart) OR A paper copy in the mail If you have any lab test that is abnormal or we need to change your treatment, we will call you to review the results.   Testing/Procedures: CT of the abdomen and pelvis CT of the chest Both scans are Scheduled for [facility] [address] [place]- you will receive a call to get these scheduled.  Follow-Up: At [facility], you and your health needs are our priority.  As part of our continuing mission to provide you with exceptional heart care, we have created designated Provider Care Teams.  These Care Teams include your primary Cardiologist (physician) and Advanced Practice Providers (APPs -  Physician Assistants and Nurse Practitioners) who all work together to provide you with the care you need, when you need it.  We recommend signing up for the patient portal called "MyChart".  Sign up information is provided on this After Visit Summary.  MyChart is used to connect with patients for Virtual Visits (Telemedicine).  Patients are able to view lab/test results, encounter notes, upcoming appointments, etc.  Non-urgent messages can be sent to your provider as well.   To learn more  about what you can do with MyChart, go to [email or web address].    Your next appointment:   1 year(s)  Provider:   Thurmon Fair, MD             Signed, Thurmon Fair, MD  09/09/2023 2:47 PM    [facility] [address] [place], [place], [place]  [address] Phone: [phone]; Fax: [phone]

## 2023-09-09 NOTE — Patient Instructions (Addendum)
Medication Instructions:  No changes *If you need a refill on your cardiac medications before your next appointment, please call your pharmacy*   Lab Work: B12, BMP If you have labs (blood work) drawn today and your tests are completely normal, you will receive your results only by: MyChart Message (if you have MyChart) OR A paper copy in the mail If you have any lab test that is abnormal or we need to change your treatment, we will call you to review the results.   Testing/Procedures: CT of the abdomen and pelvis CT of the chest Both scans are Scheduled for [facility] [address] [place]- you will receive a call to get these scheduled.  Follow-Up: At [facility], you and your health needs are our priority.  As part of our continuing mission to provide you with exceptional heart care, we have created designated Provider Care Teams.  These Care Teams include your primary Cardiologist (physician) and Advanced Practice Providers (APPs -  Physician Assistants and Nurse Practitioners) who all work together to provide you with the care you need, when you need it.  We recommend signing up for the patient portal called "MyChart".  Sign up information is provided on this After Visit Summary.  MyChart is used to connect with patients for Virtual Visits (Telemedicine).  Patients are able to view lab/test results, encounter notes, upcoming appointments, etc.  Non-urgent messages can be sent to your provider as well.   To learn more about what you can do with MyChart, go to [email or web address].    Your next appointment:   1 year(s)  Provider:   Thurmon Fair, MD

## 2023-09-13 ENCOUNTER — Other Ambulatory Visit: Payer: Self-pay

## 2023-09-13 ENCOUNTER — Other Ambulatory Visit (HOSPITAL_COMMUNITY): Payer: Self-pay

## 2023-09-13 NOTE — Progress Notes (Signed)
12/30/24[name]  Patient's wife called and requested to pick up medication on 12/30 at [facility] at [facility]. Patient was informed that it will be after lunch time before it is ready.

## 2023-09-14 ENCOUNTER — Encounter: Payer: Self-pay | Admitting: Family Medicine

## 2023-09-14 ENCOUNTER — Ambulatory Visit (INDEPENDENT_AMBULATORY_CARE_PROVIDER_SITE_OTHER): Payer: Medicare Other

## 2023-09-14 ENCOUNTER — Ambulatory Visit (INDEPENDENT_AMBULATORY_CARE_PROVIDER_SITE_OTHER): Payer: Medicare Other | Admitting: Family Medicine

## 2023-09-14 DIAGNOSIS — Z0000 Encounter for general adult medical examination without abnormal findings: Secondary | ICD-10-CM | POA: Diagnosis not present

## 2023-09-14 DIAGNOSIS — I495 Sick sinus syndrome: Secondary | ICD-10-CM

## 2023-09-14 LAB — CUP PACEART REMOTE DEVICE CHECK
Battery Remaining Longevity: 106 mo
Battery Voltage: 2.99 V
Brady Statistic AP VP Percent: 0.22 %
Brady Statistic AP VS Percent: 74.47 %
Brady Statistic AS VP Percent: 0.03 %
Brady Statistic AS VS Percent: 25.28 %
Brady Statistic RA Percent Paced: 75.77 %
Brady Statistic RV Percent Paced: 0.25 %
Date Time Interrogation Session: 20241230205655
Implantable Lead Connection Status: 753985
Implantable Lead Connection Status: 753985
Implantable Lead Implant Date: 20191001
Implantable Lead Implant Date: 20191001
Implantable Lead Location: 753859
Implantable Lead Location: 753860
Implantable Lead Model: 5076
Implantable Lead Model: 5076
Implantable Pulse Generator Implant Date: 20191001
Lead Channel Impedance Value: 304 Ohm
Lead Channel Impedance Value: 342 Ohm
Lead Channel Impedance Value: 418 Ohm
Lead Channel Impedance Value: 475 Ohm
Lead Channel Pacing Threshold Amplitude: 0.375 V
Lead Channel Pacing Threshold Amplitude: 0.75 V
Lead Channel Pacing Threshold Pulse Width: 0.4 ms
Lead Channel Pacing Threshold Pulse Width: 0.4 ms
Lead Channel Sensing Intrinsic Amplitude: 3 mV
Lead Channel Sensing Intrinsic Amplitude: 3 mV
Lead Channel Sensing Intrinsic Amplitude: 3.5 mV
Lead Channel Sensing Intrinsic Amplitude: 3.5 mV
Lead Channel Setting Pacing Amplitude: 1.5 V
Lead Channel Setting Pacing Amplitude: 2 V
Lead Channel Setting Pacing Pulse Width: 0.4 ms
Lead Channel Setting Sensing Sensitivity: 0.9 mV
Zone Setting Status: 755011
Zone Setting Status: 755011

## 2023-09-14 NOTE — Patient Instructions (Signed)
 I really enjoyed getting to talk with you today! I am available on Tuesdays and Thursdays for virtual visits if you have any questions or concerns, or if I can be of any further assistance.   CHECKLIST FROM ANNUAL WELLNESS VISIT:  -Follow up (please call to schedule if not scheduled after visit):   -yearly for annual wellness visit with primary care office  Here is a list of your preventive care/health maintenance measures and the plan for each if any are due:  PLAN For any measures below that may be due:  -can get the shingles vaccines and the updated covid vaccine at the pharmacy - if you do, please let us  know so that we can update your chart.  Health Maintenance  Topic Date Due   COVID-19 Vaccine (4 - 2024-25 season) 05/16/2023   Zoster Vaccines- Shingrix (1 of 2) 10/31/2023 (Originally 07/25/1960)   Medicare Annual Wellness (AWV)  09/13/2024   DTaP/Tdap/Td (3 - Td or Tdap) 02/12/2029   Pneumonia Vaccine 42+ Years old  Completed   INFLUENZA VACCINE  Completed   HPV VACCINES  Aged Out   Hepatitis C Screening  Discontinued    -See a dentist at least yearly  -Get your eyes checked and then per your eye specialist's recommendations  -Other issues addressed today:   -I have included below further information regarding a healthy whole foods based diet, physical activity guidelines for adults, stress management and opportunities for social connections. I hope you find this information useful.   -----------------------------------------------------------------------------------------------------------------------------------------------------------------------------------------------------------------------------------------------------------  NUTRITION: -eat real food: lots of colorful vegetables (half the plate) and fruits -5-7 servings of vegetables and fruits per day (fresh or steamed is best), exp. 2 servings of vegetables with lunch and dinner and 2 servings of fruit per day.  Berries and greens such as kale and collards are great choices.  -consume on a regular basis: whole grains (make sure first ingredient on label contains the word whole), fresh fruits, fish, nuts, seeds, healthy oils (such as olive oil, avocado oil, grape seed oil) -may eat small amounts of dairy and lean meat on occasion, but avoid processed meats such as ham, bacon, lunch meat, etc. -drink water  -try to avoid fast food and pre-packaged foods, processed meat -most experts advise limiting sodium to < 2300mg  per day, should limit further is any chronic conditions such as high blood pressure, heart disease, diabetes, etc. The American Heart Association advised that < 1500mg  is is ideal -try to avoid foods that contain any ingredients with names you do not recognize  -try to avoid sugar/sweets (except for the natural sugar that occurs in fresh fruit) -try to avoid sweet drinks -try to avoid white rice, white bread, pasta (unless whole grain), white or yellow potatoes  EXERCISE GUIDELINES FOR ADULTS: -if you wish to increase your physical activity, do so gradually and with the approval of your doctor -STOP and seek medical care immediately if you have any chest pain, chest discomfort or trouble breathing when starting or increasing exercise  -move and stretch your body, legs, feet and arms when sitting for long periods -Physical activity guidelines for optimal health in adults: -least 150 minutes per week of aerobic exercise (can talk, but not sing) once approved by your doctor, 20-30 minutes of sustained activity or two 10 minute episodes of sustained activity every day.  -resistance training at least 2 days per week if approved by your doctor -balance exercises 3+ days per week:   Stand somewhere where you have something sturdy to  hold onto if you lose balance.    1) lift up on toes, start with 5x per day and work up to 20x   2) stand and lift on leg straight out to the side so that foot is a few  inches of the floor, start with 5x each side and work up to 20x each side   3) stand on one foot, start with 5 seconds each side and work up to 20 seconds on each side  If you need ideas or help with getting more active:  -Silver sneakers https://tools.silversneakers.com  -Walk with a Doc: Http://www.duncan-[email or web address]/  -try to include resistance (weight lifting/strength building) and balance exercises twice per week: or the following link for ideas: http://castillo-powell.com/  buyducts.dk  STRESS MANAGEMENT: -can try meditating, or just sitting quietly with deep breathing while intentionally relaxing all parts of your body for 5 minutes daily -if you need further help with stress, anxiety or depression please follow up with your primary doctor or contact the wonderful folks at [facility]: [phone]  SOCIAL CONNECTIONS: -options in [place] if you wish to engage in more social and exercise related activities:  -Silver sneakers https://tools.silversneakers.com  -Walk with a Doc: Http://www.duncan-williams.[email or web address]/  -Check out the [facility] Active Adults 50+ section on the [place] of [employer] (hiking clubs, book clubs, cards and games, chess, exercise classes, aquatic classes and much more) - see the website for details: https://www.[place].gov/departments/parks-recreation/active-adults50  -YouTube has lots of exercise videos for different ages and abilities as well  -[facility] (a variety of indoor and outdoor inperson activities for adults). [phone]. [address]  -Virtual Online Classes (a variety of topics): see seniorplanet.org or call [phone]  -consider volunteering at a school, hospice center, church, senior center or elsewhere         ADVANCED HEALTHCARE DIRECTIVES:  [place] Advanced Directives  assistance:   expressweek.com.cy  Everyone should have advanced health care directives in place. This is so that you get the care you want, should you ever be in a situation where you are unable to make your own medical decisions.   From the [place] Advanced Directive Website: Advance Health Care Directives are legal documents in which you give written instructions about your health care if, in the future, you cannot speak for yourself.   A health care power of attorney allows you to name a person you trust to make your health care decisions if you cannot make them yourself. A declaration of a desire for a natural death (or living will) is document, which states that you desire not to have your life prolonged by extraordinary measures if you have a terminal or incurable illness or if you are in a vegetative state. An advance instruction for mental health treatment makes a declaration of instructions, information and preferences regarding your mental health treatment. It also states that you are aware that the advance instruction authorizes a mental health treatment provider to act according to your wishes. It may also outline your consent or refusal of mental health treatment. A declaration of an anatomical gift allows anyone over the age of 8 to make a gift by will, organ donor card or other document.   Please see the following website or an elder law attorney for forms, FAQs and for completion of advanced directives: [place]  [employer] Health Care Directives Advance Health Care Directives ([email or web address].[email or web address]/)  Or copy and paste the following to your web browser: [email or web address].fi

## 2023-09-14 NOTE — Progress Notes (Signed)
 PATIENT CHECK-IN and HEALTH RISK ASSESSMENT QUESTIONNAIRE:  -completed by phone/video for upcoming Medicare Preventive Visit  Pre-Visit Check-in: 1)Vitals (height, wt, BP, etc) - record in vitals section for visit on day of visit Request home vitals (wt, BP, etc.) and enter into vitals, THEN update Vital Signs SmartPhrase below at the top of the HPI. See below.  2)Review and Update Medications, Allergies PMH, Surgeries, Social history in Epic 3)Hospitalizations in the last year with date/reason? N/a  4)Review and Update Care Team (patient's specialists) in Epic 5) Complete PHQ9 in Epic  6) Complete Fall Screening in Epic 7)Review all Health Maintenance Due and order under PCP if not done.  Medicare Wellness Patient Questionnaire:  Answer theses question about your habits: How often do you have a drink containing alcohol ?0 How many drinks containing alcohol  do you have on a typical day when you are drinking?0 How often do you have six or more drinks on one occasion? n/a Have you ever smoked?n/a Quit date if applicable? n/a  How many packs a day do/did you smoke? n/a Do you use smokeless tobacco? n/a Do you use an illicit drugs? n/a On average, how many days per week do you engage in moderate to strenuous exercise (like a brisk walk)? daily On average, how many minutes do you engage in exercise at this level?as much as possible Are you sexually active? No Number of partners?0 Typical breakfast**egg sandwich** Typical lunch bologna sandwich and pork and beans Typical dinner anything that is good/hamburgers or sandwiches Typical snacks:**rarely snack*  Beverages: water  mostly sometimes ginger ale  Answer theses question about your everyday activities: Can you perform most household chores?yes Are you deaf or have significant trouble hearing? no Do you feel that you have a problem with memory? yes Do you feel safe at home?yes Last dentist visit? Has dentures 8. Do you have any  difficulty performing your everyday activities?no Are you having any difficulty walking, taking medications on your own, and or difficulty managing daily home needs? Walks to the mail box and around his yard daily as long as it is not raining Do you have difficulty walking or climbing stairs?no Do you have difficulty dressing or bathing?no Do you have difficulty doing errands alone such as visiting a doctor's office or shopping? Yes, pt does not drive anymore - others drive for him Do you currently have any difficulty preparing food and eating? no Do you currently have any difficulty using the toilet?no Do you have any difficulty managing your finances?no; wife completes Do you have any difficulties with housekeeping of managing your housekeeping?no   Do you have Advanced Directives in place (Living Will, Healthcare Power or Attorney)? no   Last eye Exam and location?1 year [facility]   Do you currently use prescribed or non-prescribed narcotic or opioid pain medications? no  Do you have a history or close family history of breast, ovarian, tubal or peritoneal cancer or a family member with BRCA (breast cancer susceptibility 1 and 2) gene mutations?  N/aRequest home vitals (wt, BP, etc.) and enter into vitals, THEN update Vital Signs SmartPhrase below at the top of the HPI. See below.   Nurse/Assistant Credentials/time stamp: Shanda Sharps, CMA 09:53 AM   ----------------------------------------------------------------------------------------------------------------------------------------------------------------------------------------------------------------------  Because this visit was a virtual/telehealth visit, some criteria may be missing or patient reported. Any vitals not documented were not able to be obtained and vitals that have been documented are patient reported.    MEDICARE ANNUAL PREVENTIVE CARE VISIT WITH PROVIDER (Welcome to Medicare,  initial annual wellness  or annual wellness exam)  Virtual Visit via Phone Note  I connected with Tony [name] on 09/14/23  by phone and verified that I am speaking with the correct person using two identifiers.  Location patient: home Location provider:work or home office Persons participating in the virtual visit: patient, provider  Concerns and/or follow up today: no concerns, reports  everything is going good.   See HM section in Epic for other details of completed HM.    ROS: negative for report of fevers, unintentional weight loss, vision changes, vision loss, hearing loss or change, chest pain, sob, hemoptysis, melena, hematochezia, hematuria, falls, bleeding or bruising, thoughts of suicide or self harm, memory loss  Patient-completed extensive health risk assessment - reviewed and discussed with the patient: See Health Risk Assessment completed with patient prior to the visit either above or in recent phone note. This was reviewed in detailed with the patient today and appropriate recommendations, orders and referrals were placed as needed per Summary below and patient instructions.   Review of Medical History: -PMH, PSH, Family History and current specialty and care providers reviewed and updated and listed below   Patient Care Team: [name], [name], MD as PCP - General (Family Medicine) Croitoru, Jerel, MD as PCP - Cardiology (Cardiology) Liane Sharyne MATSU, [facility] (Inactive) as Pharmacist (Pharmacist) Lanny Callander, MD as Attending Physician (Hematology and Oncology)   Past Medical History:  Diagnosis Date   Anxiety    Arthritis    COPD (chronic obstructive pulmonary disease) (HCC)    Coronary artery disease    Dyspnea    Gastric outlet obstruction 02/03/2018   thelbert 02/03/2018   GERD (gastroesophageal reflux disease)    History of hiatal hernia    small/notes 02/03/2018   Hypertension    no meds   Presence of permanent cardiac pacemaker    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    Gleason 4+5=9, PSA 15.65   Radiation    Sinus problem    SSS (sick sinus syndrome) (HCC)     Past Surgical History:  Procedure Laterality Date   APPENDECTOMY  2019   hx hematoma s/p appendectomy   CHOLECYSTECTOMY N/A 05/15/2022   Procedure: LAPAROSCOPIC CHOLECYSTECTOMY;  Surgeon: Dasie Leonor CROME, MD;  Location: [facility];  Service: General;  Laterality: N/A;   ESOPHAGOGASTRODUODENOSCOPY (EGD) WITH PROPOFOL  N/A 02/03/2018   Procedure: ESOPHAGOGASTRODUODENOSCOPY (EGD) WITH PROPOFOL ;  Surgeon: Rosalie Kitchens, MD;  Location: [facility];  Service: Endoscopy;  Laterality: N/A;   FRACTURE SURGERY     INCISIONAL HERNIA REPAIR N/A 10/14/2022   Procedure: INCISIONAL HERNIA REPAIR WITH MESH;  Surgeon: Dasie Leonor CROME, MD;  Location: [facility];  Service: General;  Laterality: N/A;   INSERT / REPLACE / REMOVE PACEMAKER  06/14/2018   LAPAROSCOPIC APPENDECTOMY N/A 01/21/2018   Procedure: APPENDECTOMY LAPAROSCOPIC;  Surgeon: Stevie Herlene Righter, MD;  Location: [facility];  Service: General;  Laterality: N/A;   PACEMAKER IMPLANT N/A 06/14/2018   Procedure: PACEMAKER IMPLANT - Dual Chamber;  Surgeon: Francyne Jerel, MD;  Location: [facility];  Service: Cardiovascular;  Laterality: N/A;   PROSTATE BIOPSY  02/08/14   gleason 4+5=9, 12/12 cores positive, 54 gm   RADIOACTIVE SEED IMPLANT N/A 06/29/2014   Procedure: RADIOACTIVE SEED IMPLANT;  Surgeon: Alm GORMAN Fragmin, MD;  Location[facility];  Service: Urology;  Laterality: N/A;   WRIST FRACTURE SURGERY Right 1980s    Social History   Socioeconomic History   Marital status: Married  Spouse name: Not on file   Number of children: 3   Years of education: Not on file   Highest education level: Not on file  Occupational History   Occupation: Estate agent    Employer: [employer]    Comment: retired   Occupation: production designer, theatre/television/film    Comment: [place]-retired  Tobacco Use   Smoking status: Former    Types: Cigars    Quit date: 09/14/2012    Years  since quitting: 11.0   Smokeless tobacco: Never   Tobacco comments:    little cigars; the small ones  Vaping Use   Vaping status: Never Used  Substance and Sexual Activity   Alcohol  use: Not Currently    Alcohol /week: 14.0 standard drinks of alcohol     Types: 14 Cans of beer per week    Comment: quit in 2000   Drug use: No   Sexual activity: Not Currently  Other Topics Concern   Not on file  Social History Narrative   Lives with wife and 2 dogs in one level home; daughter and her family co-habitate.   Has three daughters, all in [place], supportive. Five grandchildren.   Wants to return to doing silver sneakers once gym re-opens.         Social Drivers of Corporate Investment Banker Strain: Low Risk  (07/29/2022)   Overall Financial Resource Strain (CARDIA)    Difficulty of Paying Living Expenses: Not hard at all  Recent Concern: Financial Resource Strain - Medium Risk (05/27/2022)   Overall Financial Resource Strain (CARDIA)    Difficulty of Paying Living Expenses: Somewhat hard  Food Insecurity: No Food Insecurity (10/15/2022)   Hunger Vital Sign    Worried About Running Out of Food in the Last Year: Never true    Ran Out of Food in the Last Year: Never true  Transportation Needs: No Transportation Needs (10/15/2022)   PRAPARE - Administrator, Civil Service (Medical): No    Lack of Transportation (Non-Medical): No  Physical Activity: Insufficiently Active (07/29/2022)   Exercise Vital Sign    Days of Exercise per Week: 5 days    Minutes of Exercise per Session: 20 min  Stress: No Stress Concern Present (07/29/2022)   [employer] of Occupational Health - Occupational Stress Questionnaire    Feeling of Stress : Not at all  Social Connections: Socially Integrated (07/29/2022)   Social Connection and Isolation Panel [NHANES]    Frequency of Communication with Friends and Family: More than three times a week    Frequency of Social Gatherings with Friends and  Family: More than three times a week    Attends Religious Services: More than 4 times per year    Active Member of [employer] or Organizations: Yes    Attends Banker Meetings: More than 4 times per year    Marital Status: Married  Catering Manager Violence: Not At Risk (10/15/2022)   Humiliation, Afraid, Rape, and Kick questionnaire    Fear of Current or Ex-Partner: No    Emotionally Abused: No    Physically Abused: No    Sexually Abused: No    Family History  Problem Relation Age of Onset   Alzheimer's disease Mother    Alzheimer's disease Father    Prostate cancer Father    Arthritis Sister    Prostate cancer Paternal Grandfather    Prostate cancer Brother    Stomach cancer Brother    Lung cancer Brother    Prostate cancer Brother  Current Outpatient Medications on File Prior to Visit  Medication Sig Dispense Refill   acetaminophen  (TYLENOL ) 500 MG tablet Take 2 tablets (1,000 mg total) by mouth 3 (three) times daily. 30 tablet 0   albuterol  (VENTOLIN  HFA) 108 (90 Base) MCG/ACT inhaler TAKE 2 PUFFS BY MOUTH EVERY 6 HOURS AS NEEDED FOR WHEEZE OR SHORTNESS OF BREATH 8.5 each 12   ALPRAZolam  (XANAX ) 1 MG tablet TAKE 1/2 TABLET TWICE DAILY AS NEEDED. NEXT REFILL 03/17/23 30 tablet 3   aspirin  EC 81 MG tablet Take 81 mg by mouth daily.     atorvastatin  (LIPITOR) 20 MG tablet TAKE 1 TABLET BY MOUTH ONCE  DAILY 100 tablet 2   benzonatate  (TESSALON ) 100 MG capsule Take 1 capsule (100 mg total) by mouth daily as needed for cough. 30 capsule 1   budesonide -formoterol  (SYMBICORT ) 160-4.5 MCG/ACT inhaler 1 puff then rinse mouth, twice daily 10.2 g 6   citalopram  (CELEXA ) 10 MG tablet TAKE 1 TABLET BY MOUTH DAILY 100 tablet 1   diclofenac  Sodium (VOLTAREN ) 1 % GEL AAPLY 4 GRAMS TOPICALLY 4 TIMES DAILY (Patient taking differently: Apply 1 Application topically 4 (four) times daily as needed (pain).) 400 g 2   enzalutamide  (XTANDI ) 40 MG tablet Take 4 tablets (160 mg total) by mouth  daily. 120 tablet 1   fluticasone  (FLONASE ) 50 MCG/ACT nasal spray PLACE 1 SPRAY INTO BOTH NOSTRILS DAILY AS NEEDED FOR ALLERGIES OR RHINITIS (AT BEDTIME). 48 mL 1   furosemide  (LASIX ) 20 MG tablet TAKE 1 TABLET BY MOUTH DAILY 100 tablet 2   gabapentin  (NEURONTIN ) 300 MG capsule Take 1 capsule (300 mg total) by mouth at bedtime. 30 capsule 3   Melatonin 10 MG CAPS Take 20 mg by mouth at bedtime.     metoprolol  tartrate (LOPRESSOR ) 25 MG tablet Take 0.5 tablets (12.5 mg total) by mouth 2 (two) times daily. 180 tablet 3   Multiple Vitamin (MULTIVITAMIN WITH MINERALS) TABS tablet Take 0.5 tablets by mouth daily.     omeprazole  (PRILOSEC) 20 MG capsule Take 1 capsule (20 mg total) by mouth daily. 100 capsule 3   Spacer/Aero-Holding Raguel FRENCH To use with inhalers 1 Device 0   No current facility-administered medications on file prior to visit.    Allergies  Allergen Reactions   Penicillins Rash    Has patient had a PCN reaction causing immediate rash, facial/tongue/throat swelling, SOB or lightheadedness with hypotension: Yes Has patient had a PCN reaction causing severe rash involving mucus membranes or skin necrosis: No Has patient had a PCN reaction that required hospitalization: No Has patient had a PCN reaction occurring within the last 10 years: No If all of the above answers are NO, then may proceed with Cephalosporin use.       Physical Exam Vitals requested from patient and listed below if patient had equipment and was able to obtain at home for this virtual visit: There were no vitals filed for this visit. Estimated body mass index is 32.08 kg/m as calculated from the following:   Height as of 09/09/23: 5' 8 (1.727 m).   Weight as of 09/09/23: 211 lb (95.7 kg).  EKG (optional): deferred due to virtual visit  GENERAL: alert, oriented, no acute distress detected; full vision exam deferred due to pandemic and/or virtual encounter   PSYCH/NEURO: pleasant and cooperative,  no obvious depression or anxiety, speech and thought processing grossly intact, Cognitive function grossly intact  [employer] Office Visit from 07/27/2023 in [facility]  PHQ-9 Total Score 0           09/14/2023    9:44 AM 07/31/2023    9:41 PM 04/12/2023    9:27 AM 12/25/2022    9:28 AM 10/26/2022   12:16 PM  Depression screen PHQ 2/9  Decreased Interest 0 0 0 0 0  Down, Depressed, Hopeless 0 0 0 0 0  PHQ - 2 Score 0 0 0 0 0  Altered sleeping  0 0 0   Tired, decreased energy  0 0 0   Change in appetite  0 0 0   Feeling bad or failure about yourself   0 0 0   Trouble concentrating  0 0 0   Moving slowly or fidgety/restless  0 0 0   Suicidal thoughts  0 0 0   PHQ-9 Score  0 0 0   Difficult doing work/chores  Not difficult at all Not difficult at all Not difficult at all        07/29/2022   10:34 AM 08/14/2022    6:30 PM 10/12/2022    9:27 AM 10/26/2022   12:16 PM 09/14/2023    9:43 AM  Fall Risk  Falls in the past year? 0  0 0 0  Was there an injury with Fall? 0  0 0 0  Fall Risk Category Calculator 0  0 0 0  Fall Risk Category (Retired) Low      (RETIRED) Patient Fall Risk Level Low fall risk Low fall risk     Patient at Risk for Falls Due to No Fall Risks  Other (Comment);Impaired balance/gait Other (Comment);Impaired balance/gait Impaired mobility  Fall risk Follow up Falls prevention discussed  Falls evaluation completed Falls evaluation completed Falls evaluation completed     SUMMARY AND PLAN:  Encounter for Medicare annual wellness exam   Discussed applicable health maintenance/preventive health measures and advised and referred or ordered per patient preferences: -advised of vaccines due and advise can get at the pharmacy Health Maintenance  Topic Date Due   COVID-19 Vaccine (4 - 2024-25 season) 05/16/2023   Zoster Vaccines- Shingrix (1 of 2) 10/31/2023 (Originally 07/25/1960)   Medicare Annual Wellness (AWV)  09/13/2024    DTaP/Tdap/Td (3 - Td or Tdap) 02/12/2029   Pneumonia Vaccine 91+ Years old  Completed   INFLUENZA VACCINE  Completed   HPV VACCINES  Aged Out   Hepatitis C Screening  Discontinued     Education and counseling on the following was provided based on the above review of health and a plan/checklist for the patient, along with additional information discussed, was provided for the patient in the patient instructions :  -Advised on importance of completing advanced directives, discussed options for completing and provided information in patient instructions as well -Advised and counseled on a healthy lifestyle  -Reviewed patient's current diet.  A summary of a healthy diet was provided in the Patient Instructions.  -reviewed patient's current physical activity level. Provided exercise guidelines for adults, community resources and ideas for safe exercise at home to assist in meeting exercise guideline recommendations in a safe and healthy way.  -Advise yearly dental visits at minimum and regular eye exams   Follow up: see patient instructions   Patient Instructions  I really enjoyed getting to talk with you today! I am available on Tuesdays and Thursdays for virtual visits if you have any questions or concerns, or if I can be of any further assistance.   CHECKLIST FROM ANNUAL WELLNESS VISIT:  -Follow  up (please call to schedule if not scheduled after visit):   -yearly for annual wellness visit with primary care office  Here is a list of your preventive care/health maintenance measures and the plan for each if any are due:  PLAN For any measures below that may be due:  -can get the shingles vaccines and the updated covid vaccine at the pharmacy - if you do, please let us  know so that we can update your chart.  Health Maintenance  Topic Date Due   COVID-19 Vaccine (4 - 2024-25 season) 05/16/2023   Zoster Vaccines- Shingrix (1 of 2) 10/31/2023 (Originally 07/25/1960)   Medicare Annual  Wellness (AWV)  09/13/2024   DTaP/Tdap/Td (3 - Td or Tdap) 02/12/2029   Pneumonia Vaccine 57+ Years old  Completed   INFLUENZA VACCINE  Completed   HPV VACCINES  Aged Out   Hepatitis C Screening  Discontinued    -See a dentist at least yearly  -Get your eyes checked and then per your eye specialist's recommendations  -Other issues addressed today:   -I have included below further information regarding a healthy whole foods based diet, physical activity guidelines for adults, stress management and opportunities for social connections. I hope you find this information useful.   -----------------------------------------------------------------------------------------------------------------------------------------------------------------------------------------------------------------------------------------------------------  NUTRITION: -eat real food: lots of colorful vegetables (half the plate) and fruits -5-7 servings of vegetables and fruits per day (fresh or steamed is best), exp. 2 servings of vegetables with lunch and dinner and 2 servings of fruit per day. Berries and greens such as kale and collards are great choices.  -consume on a regular basis: whole grains (make sure first ingredient on label contains the word whole), fresh fruits, fish, nuts, seeds, healthy oils (such as olive oil, avocado oil, grape seed oil) -may eat small amounts of dairy and lean meat on occasion, but avoid processed meats such as ham, bacon, lunch meat, etc. -drink water  -try to avoid fast food and pre-packaged foods, processed meat -most experts advise limiting sodium to < 2300mg  per day, should limit further is any chronic conditions such as high blood pressure, heart disease, diabetes, etc. The American Heart Association advised that < 1500mg  is is ideal -try to avoid foods that contain any ingredients with names you do not recognize  -try to avoid sugar/sweets (except for the natural sugar that  occurs in fresh fruit) -try to avoid sweet drinks -try to avoid white rice, white bread, pasta (unless whole grain), white or yellow potatoes  EXERCISE GUIDELINES FOR ADULTS: -if you wish to increase your physical activity, do so gradually and with the approval of your doctor -STOP and seek medical care immediately if you have any chest pain, chest discomfort or trouble breathing when starting or increasing exercise  -move and stretch your body, legs, feet and arms when sitting for long periods -Physical activity guidelines for optimal health in adults: -least 150 minutes per week of aerobic exercise (can talk, but not sing) once approved by your doctor, 20-30 minutes of sustained activity or two 10 minute episodes of sustained activity every day.  -resistance training at least 2 days per week if approved by your doctor -balance exercises 3+ days per week:   Stand somewhere where you have something sturdy to hold onto if you lose balance.    1) lift up on toes, start with 5x per day and work up to 20x   2) stand and lift on leg straight out to the side so that foot is a few  inches of the floor, start with 5x each side and work up to 20x each side   3) stand on one foot, start with 5 seconds each side and work up to 20 seconds on each side  If you need ideas or help with getting more active:  -Silver sneakers https://tools.silversneakers.com  -Walk with a Doc: Http://www.duncan-williams.com/  -try to include resistance (weight lifting/strength building) and balance exercises twice per week: or the following link for ideas: http://castillo-powell.com/  buyducts.dk  STRESS MANAGEMENT: -can try meditating, or just sitting quietly with deep breathing while intentionally relaxing all parts of your body for 5 minutes daily -if you need further help with stress, anxiety or depression please follow up with your  primary doctor or contact the wonderful folks at [facility]: [phone]  SOCIAL CONNECTIONS: -options in [place] if you wish to engage in more social and exercise related activities:  -Silver sneakers https://tools.silversneakers.com  -Walk with a Doc: Http://www.duncan-williams.com/  -Check out the [facility] Active Adults 50+ section on the [place] of [employer] companies (hiking clubs, book clubs, cards and games, chess, exercise classes, aquatic classes and much more) - see the website for details: https://www.[place].gov/departments/parks-recreation/active-adults50  -YouTube has lots of exercise videos for different ages and abilities as well  -[facility] (a variety of indoor and outdoor inperson activities for adults). [phone]. [address].  -Virtual Online Classes (a variety of topics): see seniorplanet.org or call [phone]  -consider volunteering at a school, hospice center, church, senior center or elsewhere         ADVANCED HEALTHCARE DIRECTIVES:  [place] Advanced Directives assistance:   expressweek.com.cy  Everyone should have advanced health care directives in place. This is so that you get the care you want, should you ever be in a situation where you are unable to make your own medical decisions.   From the [place] Advanced Directive Website: Advance Health Care Directives are legal documents in which you give written instructions about your health care if, in the future, you cannot speak for yourself.   A health care power of attorney allows you to name a person you trust to make your health care decisions if you cannot make them yourself. A declaration of a desire for a natural death (or living will) is document, which states that you desire not to have your life prolonged by extraordinary measures if you have a terminal or incurable illness or if you are in a  vegetative state. An advance instruction for mental health treatment makes a declaration of instructions, information and preferences regarding your mental health treatment. It also states that you are aware that the advance instruction authorizes a mental health treatment provider to act according to your wishes. It may also outline your consent or refusal of mental health treatment. A declaration of an anatomical gift allows anyone over the age of 104 to make a gift by will, organ donor card or other document.   Please see the following website or an elder law attorney for forms, FAQs and for completion of advanced directives: [place]  Print Production Planner Health Care Directives Advance Health Care Directives (http://guzman.com/)  Or copy and paste the following to your web browser: Poshchat.fi    Chiquita JONELLE Cramp, DO

## 2023-09-17 ENCOUNTER — Other Ambulatory Visit: Payer: Self-pay | Admitting: Family Medicine

## 2023-09-17 DIAGNOSIS — I10 Essential (primary) hypertension: Secondary | ICD-10-CM

## 2023-09-22 NOTE — Telephone Encounter (Signed)
 I don't see mention of daytime O concern in my last note. Suggest we cancel the order for POC and for walk tet. We can reassess at future visit.

## 2023-09-22 NOTE — Telephone Encounter (Signed)
 I called and spoke with the pt  He confirmed that he is only using o2 with sleep  He does not use daytime o2 at all   I called [facility] and spoke with Juanna  She states that they needed walk test bc they have orders from us  for noct o2 and POC    Dr Neysa- can you please confirm if he needs o2 for daytime use  He is not using this   If he does need it, we will have to get him in for walk  Thanks!

## 2023-09-23 NOTE — Telephone Encounter (Signed)
 Pt calling back

## 2023-09-23 NOTE — Telephone Encounter (Signed)
 I called to discuss this with the pt  He was unavailable- LMTCB

## 2023-09-24 NOTE — Telephone Encounter (Signed)
 I called and spoke with the pt. I informed the pt of Dr Roxy Cedar recommendations of reassessing at his next office visit, and pt stated this was fine and verbalized understanding. NFN.

## 2023-09-30 ENCOUNTER — Other Ambulatory Visit: Payer: Medicare Other

## 2023-10-02 ENCOUNTER — Other Ambulatory Visit: Payer: Self-pay | Admitting: Family Medicine

## 2023-10-02 DIAGNOSIS — R053 Chronic cough: Secondary | ICD-10-CM

## 2023-10-09 ENCOUNTER — Other Ambulatory Visit: Payer: Self-pay | Admitting: Family Medicine

## 2023-10-09 DIAGNOSIS — F418 Other specified anxiety disorders: Secondary | ICD-10-CM

## 2023-10-11 ENCOUNTER — Other Ambulatory Visit: Payer: Self-pay

## 2023-10-11 ENCOUNTER — Other Ambulatory Visit (HOSPITAL_COMMUNITY): Payer: Self-pay

## 2023-10-11 NOTE — Progress Notes (Signed)
Specialty Pharmacy Refill Coordination Note  Tony [name] is a 83 y.o. male contacted today regarding refills of specialty medication(s) Enzalutamide [name]   Patient requested Delivery   Delivery date: 10/12/23   Verified address: [address]   [place] [address]   Medication will be filled on 10/11/23.

## 2023-10-11 NOTE — Progress Notes (Signed)
Specialty Pharmacy Ongoing Clinical Assessment Note  Tony [name] is a 83 y.o. male who is being followed by the specialty pharmacy service for RxSp Oncology   Patient's specialty medication(s) reviewed today: Enzalutamide [name]   Missed doses in the last 4 weeks: 0   Patient/Caregiver did not have any additional questions or concerns.   Therapeutic benefit summary: Patient is achieving benefit   Adverse events/side effects summary: No adverse events/side effects   Patient's therapy is appropriate to: Continue    Goals Addressed             This Visit's Progress    Slow Disease Progression   On track    Patient is on track. Patient will maintain adherence         Follow up:  6 months  Otto Herb Specialty Pharmacist

## 2023-10-13 ENCOUNTER — Ambulatory Visit: Payer: Medicare Other | Admitting: Family Medicine

## 2023-10-26 NOTE — Progress Notes (Signed)
Remote pacemaker transmission.

## 2023-10-27 ENCOUNTER — Ambulatory Visit
Admission: RE | Admit: 2023-10-27 | Discharge: 2023-10-27 | Disposition: A | Discharge status: Home / Self Care | Payer: Medicare Other | Source: Ambulatory Visit | Attending: Cardiovascular Disease | Admitting: Cardiovascular Disease

## 2023-10-27 DIAGNOSIS — I7121 Aneurysm of the ascending aorta, without rupture: Secondary | ICD-10-CM

## 2023-10-27 DIAGNOSIS — I7143 Infrarenal abdominal aortic aneurysm, without rupture: Secondary | ICD-10-CM

## 2023-10-27 MED ORDER — IOPAMIDOL (ISOVUE-370) INJECTION 76%
100.0000 mL | Freq: Once | INTRAVENOUS | Status: AC | PRN
  Administered 2023-10-27: 80 mL via INTRAVENOUS

## 2023-11-01 ENCOUNTER — Other Ambulatory Visit: Payer: Self-pay

## 2023-11-01 ENCOUNTER — Emergency Department (HOSPITAL_BASED_OUTPATIENT_CLINIC_OR_DEPARTMENT_OTHER): Payer: Medicare Other

## 2023-11-01 ENCOUNTER — Encounter (HOSPITAL_BASED_OUTPATIENT_CLINIC_OR_DEPARTMENT_OTHER): Payer: Self-pay

## 2023-11-01 ENCOUNTER — Emergency Department (HOSPITAL_BASED_OUTPATIENT_CLINIC_OR_DEPARTMENT_OTHER)
Admission: EM | Admit: 2023-11-01 | Discharge: 2023-11-02 | Disposition: A | Discharge status: Home / Self Care | Payer: Medicare Other | Attending: Emergency Medicine | Admitting: Emergency Medicine

## 2023-11-01 DIAGNOSIS — M4802 Spinal stenosis, cervical region: Secondary | ICD-10-CM | POA: Diagnosis not present

## 2023-11-01 DIAGNOSIS — Z79899 Other long term (current) drug therapy: Secondary | ICD-10-CM | POA: Insufficient documentation

## 2023-11-01 DIAGNOSIS — S51811A Laceration without foreign body of right forearm, initial encounter: Secondary | ICD-10-CM | POA: Diagnosis not present

## 2023-11-01 DIAGNOSIS — S51812A Laceration without foreign body of left forearm, initial encounter: Secondary | ICD-10-CM | POA: Diagnosis not present

## 2023-11-01 DIAGNOSIS — J449 Chronic obstructive pulmonary disease, unspecified: Secondary | ICD-10-CM | POA: Insufficient documentation

## 2023-11-01 DIAGNOSIS — S42001A Fracture of unspecified part of right clavicle, initial encounter for closed fracture: Secondary | ICD-10-CM | POA: Diagnosis not present

## 2023-11-01 DIAGNOSIS — S0990XA Unspecified injury of head, initial encounter: Secondary | ICD-10-CM | POA: Diagnosis not present

## 2023-11-01 DIAGNOSIS — Z7982 Long term (current) use of aspirin: Secondary | ICD-10-CM | POA: Insufficient documentation

## 2023-11-01 DIAGNOSIS — S20223A Contusion of bilateral back wall of thorax, initial encounter: Secondary | ICD-10-CM | POA: Diagnosis not present

## 2023-11-01 DIAGNOSIS — S300XXA Contusion of lower back and pelvis, initial encounter: Secondary | ICD-10-CM | POA: Insufficient documentation

## 2023-11-01 DIAGNOSIS — W01198A Fall on same level from slipping, tripping and stumbling with subsequent striking against other object, initial encounter: Secondary | ICD-10-CM | POA: Insufficient documentation

## 2023-11-01 DIAGNOSIS — Z950 Presence of cardiac pacemaker: Secondary | ICD-10-CM | POA: Diagnosis not present

## 2023-11-01 DIAGNOSIS — S42017A Nondisplaced fracture of sternal end of right clavicle, initial encounter for closed fracture: Secondary | ICD-10-CM

## 2023-11-01 DIAGNOSIS — W19XXXA Unspecified fall, initial encounter: Secondary | ICD-10-CM

## 2023-11-01 DIAGNOSIS — M47812 Spondylosis without myelopathy or radiculopathy, cervical region: Secondary | ICD-10-CM | POA: Diagnosis not present

## 2023-11-01 DIAGNOSIS — I10 Essential (primary) hypertension: Secondary | ICD-10-CM | POA: Diagnosis not present

## 2023-11-01 DIAGNOSIS — S59912A Unspecified injury of left forearm, initial encounter: Secondary | ICD-10-CM | POA: Diagnosis present

## 2023-11-01 DIAGNOSIS — S20211A Contusion of right front wall of thorax, initial encounter: Secondary | ICD-10-CM | POA: Diagnosis not present

## 2023-11-01 DIAGNOSIS — I6782 Cerebral ischemia: Secondary | ICD-10-CM | POA: Diagnosis not present

## 2023-11-01 DIAGNOSIS — M19011 Primary osteoarthritis, right shoulder: Secondary | ICD-10-CM | POA: Diagnosis not present

## 2023-11-01 DIAGNOSIS — I517 Cardiomegaly: Secondary | ICD-10-CM | POA: Diagnosis not present

## 2023-11-01 LAB — CBC WITH DIFFERENTIAL/PLATELET
Abs Immature Granulocytes: 0.03 10*3/uL (ref 0.00–0.07)
Basophils Absolute: 0 10*3/uL (ref 0.0–0.1)
Basophils Relative: 0 %
Eosinophils Absolute: 0 10*3/uL (ref 0.0–0.5)
Eosinophils Relative: 0 %
HCT: 38.5 % — ABNORMAL LOW (ref 39.0–52.0)
Hemoglobin: 12.5 g/dL — ABNORMAL LOW (ref 13.0–17.0)
Immature Granulocytes: 0 %
Lymphocytes Relative: 12 %
Lymphs Abs: 1.1 10*3/uL (ref 0.7–4.0)
MCH: 28.2 pg (ref 26.0–34.0)
MCHC: 32.5 g/dL (ref 30.0–36.0)
MCV: 86.9 fL (ref 80.0–100.0)
Monocytes Absolute: 0.8 10*3/uL (ref 0.1–1.0)
Monocytes Relative: 8 %
Neutro Abs: 7.1 10*3/uL (ref 1.7–7.7)
Neutrophils Relative %: 80 %
Platelets: 214 10*3/uL (ref 150–400)
RBC: 4.43 MIL/uL (ref 4.22–5.81)
RDW: 15.8 % — ABNORMAL HIGH (ref 11.5–15.5)
WBC: 9 10*3/uL (ref 4.0–10.5)
nRBC: 0 % (ref 0.0–0.2)

## 2023-11-01 LAB — BASIC METABOLIC PANEL
Anion gap: 12 (ref 5–15)
BUN: 17 mg/dL (ref 8–23)
CO2: 20 mmol/L — ABNORMAL LOW (ref 22–32)
Calcium: 9 mg/dL (ref 8.9–10.3)
Chloride: 103 mmol/L (ref 98–111)
Creatinine, Ser: 0.88 mg/dL (ref 0.61–1.24)
GFR, Estimated: >60 mL/min (ref >60)
Glucose, Bld: 120 mg/dL — ABNORMAL HIGH (ref 70–99)
Potassium: 4.4 mmol/L (ref 3.5–5.1)
Sodium: 135 mmol/L (ref 135–145)

## 2023-11-01 LAB — CK: Total CK: 335 U/L (ref 49–397)

## 2023-11-01 NOTE — ED Provider Notes (Signed)
[facility] Provider Note   CSN: [id number] Arrival date & time: 11/01/23  1803     History  Chief Complaint  Patient presents with   Marletta Lor    Tony [name] is a 83 y.o. male.  And patient was home by himself.  He fell down in the basement trying to do laundry.  Says he fell backwards lost his balance hit the back of his head.  But has a lot of bruising over his right collarbone.  Patient denies any upper or lower back pain.  Denies any hip pain.  He did get back up on his feet with help from family members when they arrived but he was on the floor for about an hour.  Patient not on any significant blood thinners but does take a baby aspirin a day.  Patient does have a pacemaker and that is on the left anterior chest.  He has a history of COPD.  Patient denies any abdominal pain denies any difficulty breathing.  Complaining of some pain to the back of the head.  Able to move upper extremities lower extremities without any difficulty.  Large skin tear to the left forearm.  Radial pulse there is 2+ good movement of the fingers sensation intact.  In addition patient denies any loss of consciousness.       Home Medications Prior to Admission medications   Medication Sig Start Date End Date Taking? Authorizing Provider  acetaminophen (TYLENOL) 500 MG tablet Take 2 tablets (1,000 mg total) by mouth 3 (three) times daily. 10/15/22   Fritzi Mandes, MD  albuterol (VENTOLIN HFA) 108 (90 Base) MCG/ACT inhaler TAKE 2 PUFFS BY MOUTH EVERY 6 HOURS AS NEEDED FOR WHEEZE OR SHORTNESS OF BREATH 07/26/23   Young, Rennis Chris, MD  ALPRAZolam Prudy Feeler) 1 MG tablet TAKE 1/2 TABLET TWICE DAILY AS NEEDED. NEXT REFILL 03/17/23 09/04/23   [place], [name], MD  aspirin EC 81 MG tablet Take 81 mg by mouth daily.    [provider]  atorvastatin (LIPITOR) 20 MG tablet TAKE 1 TABLET BY MOUTH ONCE  DAILY 08/16/23   [place], [name], MD  benzonatate (TESSALON) 100  MG capsule TAKE 1 CAPSULE BY MOUTH EVERY DAY AS NEEDED 10/06/23   [place], [name], MD  budesonide-formoterol [facility]) 160-4.5 MCG/ACT inhaler 1 puff then rinse mouth, twice daily 07/26/23   Jetty Duhamel D, MD  citalopram (CELEXA) 10 MG tablet TAKE 1 TABLET BY MOUTH DAILY 10/11/23   [place], [name], MD  diclofenac Sodium (VOLTAREN) 1 % GEL AAPLY 4 GRAMS TOPICALLY 4 TIMES DAILY Patient taking differently: Apply 1 Application topically 4 (four) times daily as needed (pain). 02/14/21   [place], [name], MD  enzalutamide [name] 40 MG tablet Take 4 tablets (160 mg total) by mouth daily. 09/01/23   Malachy Mood, MD  fluticasone (FLONASE) 50 MCG/ACT nasal spray PLACE 1 SPRAY INTO BOTH NOSTRILS DAILY AS NEEDED FOR ALLERGIES OR RHINITIS (AT BEDTIME). 09/04/19   [place], [name], MD  furosemide (LASIX) 20 MG tablet TAKE 1 TABLET BY MOUTH DAILY 04/20/23   [place], [name], MD  gabapentin (NEURONTIN) 300 MG capsule Take 1 capsule (300 mg total) by mouth at bedtime. 06/29/23   [place], [name], MD  Melatonin 10 MG CAPS Take 20 mg by mouth at bedtime.    [provider]  metoprolol tartrate (LOPRESSOR) 25 MG tablet TAKE 1 TABLET BY MOUTH TWICE  DAILY 09/20/23   [place], [name],  MD  Multiple Vitamin (MULTIVITAMIN WITH MINERALS) TABS tablet Take 0.5 tablets by mouth daily.    [provider]  omeprazole (PRILOSEC) 20 MG capsule TAKE 1 CAPSULE BY MOUTH DAILY  BEFORE BREAKFAST 10/11/23   [place], [name], MD  Spacer/Aero-Holding Deretha Emory DEVI To use with inhalers 04/12/23   [place], [name], MD      Allergies    Penicillins    Review of Systems   Review of Systems  Constitutional:  Negative for chills and fever.  HENT:  Negative for ear pain and sore throat.   Eyes:  Negative for pain and visual disturbance.  Respiratory:  Negative for cough and shortness of breath.   Cardiovascular:  Negative for chest pain and palpitations.  Gastrointestinal:  Negative for abdominal pain and vomiting.  Genitourinary:   Negative for dysuria and hematuria.  Musculoskeletal:  Negative for arthralgias and back pain.  Skin:  Positive for wound. Negative for color change and rash.  Neurological:  Positive for headaches. Negative for seizures and syncope.  All other systems reviewed and are negative.   Physical Exam Updated Vital Signs BP (!) 159/96   Pulse 68   Temp 98 F (36.7 C)   Resp 17   Ht 1.727 m (5\' 8" )   Wt 94.8 kg   SpO2 98%   BMI 31.78 kg/m  Physical Exam Vitals and nursing note reviewed.  Constitutional:      General: He is not in acute distress.    Appearance: Normal appearance. He is well-developed.  HENT:     Head: Normocephalic and atraumatic.  Eyes:     Conjunctiva/sclera: Conjunctivae normal.     Pupils: Pupils are equal, round, and reactive to light.  Cardiovascular:     Rate and Rhythm: Normal rate and regular rhythm.     Heart sounds: No murmur heard. Pulmonary:     Effort: Pulmonary effort is normal. No respiratory distress.     Breath sounds: Normal breath sounds.     Comments: Pacemaker palpable left anterior chest.  Right anterior chest of around the clavicle large area of bruise measuring about 5 x 5 cm.  No obvious clavicle deformity but it is tender to depressed there.  Shoulder itself does not show any evidence of any dislocation pretty good movement at the shoulder.  Distally on the right arm radial pulses 2+.  Good movement of fingers. Chest:     Chest wall: Tenderness present.  Abdominal:     Palpations: Abdomen is soft.     Tenderness: There is no abdominal tenderness.  Musculoskeletal:        General: Swelling and tenderness present.     Cervical back: Neck supple.     Comments: Bruising and swelling around the right clavicle without obvious deformity.  Left upper extremity forearm with very large skin tear.  Not deep.  That measures probably 4 x 12 cm.  Radial pulse left arm 2+.  Good movement of fingers sensation intact.  No obvious bony abnormality or any  pain with range of motion at the wrist elbow or shoulder.  Skin:    General: Skin is warm and dry.     Capillary Refill: Capillary refill takes less than 2 seconds.  Neurological:     General: No focal deficit present.     Mental Status: He is alert and oriented to person, place, and time.     Cranial Nerves: No cranial nerve deficit.     Sensory: No sensory deficit.  Motor: No weakness.  Psychiatric:        Mood and Affect: Mood normal.     ED Results / Procedures / Treatments   Labs (all labs ordered are listed, but only abnormal results are displayed) Labs Reviewed  CBC WITH DIFFERENTIAL/PLATELET  BASIC METABOLIC PANEL  CK    EKG EKG Interpretation Date/Time:  Monday November 01 2023 18:28:18 EST Ventricular Rate:  68 PR Interval:    QRS Duration:  155 QT Interval:  477 QTC Calculation: 508 R Axis:   9  Text Interpretation: Atrial fibrillation Paired ventricular premature complexes Right bundle branch block Confirmed by Vanetta Mulders [phone]) on 11/01/2023 7:28:03 PM  Radiology No results found.  Procedures Procedures    Medications Ordered in ED Medications - No data to display  ED Course/ Medical Decision Making/ A&P                                 Medical Decision Making Amount and/or Complexity of Data Reviewed Labs: ordered. Radiology: ordered.  Patient with fall.  Was on the ground for an hour will get 6 CK.  Also will CT head and neck.  Will get x-ray of the right clavicle x-ray of the chest and x-ray of the right shoulder.  For the skin tears of the left forearm.  Will dress with antibiotic ointment nonadherent dressing and wrap it up.  Patient states his tetanus is up-to-date.   Final Clinical Impression(s) / ED Diagnoses Final diagnoses:  Fall, initial encounter  Injury of head, initial encounter  Skin tear of left forearm without complication, initial encounter    Rx / DC Orders ED Discharge Orders     None          Vanetta Mulders, MD 11/01/23 2005

## 2023-11-01 NOTE — Progress Notes (Deleted)
 Patient Care Team: [place], [name], MD as PCP - General (Family Medicine) Croitoru, Rachelle Hora, MD as PCP - Cardiology (Cardiology) Verner Chol, [facility] (Inactive) as Pharmacist (Pharmacist) Malachy Mood, MD as Attending Physician (Hematology and Oncology)  Clinic Day:  11/01/2023  Referring physician: [place], [name], MD  ASSESSMENT & PLAN:   Assessment & Plan: Malignant neoplasm of prostate [facility]) -initially [id number] diagnosed in 2015, with Gleason score 4+5 = 9 and a PSA 15.7  -Status post radiation therapy with external beam and seed implant brachytherapy boost completed in October 2015 -He received 1 year androgen deprivation in 2016. -Due to biochemical relapse in 2017, he re-started ADT -Zytiga with prednisone started in August 2017, discontinued in October 2023 because of insurance coverage. -He was switched to Xtandi 160 mg daily in October 2023 -He has responded very well so far, PSA undetectable. -Last CT scan in May and December 2023 showed no evidence of metastasis.    The patient understands the plans discussed today and is in agreement with them.  He knows to contact our office if he develops concerns prior to his next appointment.  I provided *** minutes of face-to-face time during this encounter and > 50% was spent counseling as documented under my assessment and plan.    Carlean Jews, NP  [facility] - A DEPT OF [facility] [address] [place] [address] Dept: [phone] Dept Fax: [phone]   No orders of the defined types were placed in this encounter.     CHIEF COMPLAINT:  CC: Prostate cancer  Current Treatment: Eligard 22.5 mg every 3 months, Xtandi 100 mg daily, started 06/15/2022  INTERVAL HISTORY:  Tony [name] is here today for repeat clinical assessment.  He was last seen by myself on 08/03/2023.  He denies fevers or chills. He denies pain. His appetite is good. His weight  {Weight change:10426}.  I have reviewed the past medical history, past surgical history, social history and family history with the patient and they are unchanged from previous note.  ALLERGIES:  is allergic to penicillins.  MEDICATIONS:  Current Outpatient Medications  Medication Sig Dispense Refill   acetaminophen (TYLENOL) 500 MG tablet Take 2 tablets (1,000 mg total) by mouth 3 (three) times daily. 30 tablet 0   albuterol (VENTOLIN HFA) 108 (90 Base) MCG/ACT inhaler TAKE 2 PUFFS BY MOUTH EVERY 6 HOURS AS NEEDED FOR WHEEZE OR SHORTNESS OF BREATH 8.5 each 12   ALPRAZolam (XANAX) 1 MG tablet TAKE 1/2 TABLET TWICE DAILY AS NEEDED. NEXT REFILL 03/17/23 30 tablet 3   aspirin EC 81 MG tablet Take 81 mg by mouth daily.     atorvastatin (LIPITOR) 20 MG tablet TAKE 1 TABLET BY MOUTH ONCE  DAILY 100 tablet 2   benzonatate (TESSALON) 100 MG capsule TAKE 1 CAPSULE BY MOUTH EVERY DAY AS NEEDED 30 capsule 1   budesonide-formoterol (SYMBICORT) 160-4.5 MCG/ACT inhaler 1 puff then rinse mouth, twice daily 10.2 g 6   citalopram (CELEXA) 10 MG tablet TAKE 1 TABLET BY MOUTH DAILY 100 tablet 2   diclofenac Sodium (VOLTAREN) 1 % GEL AAPLY 4 GRAMS TOPICALLY 4 TIMES DAILY (Patient taking differently: Apply 1 Application topically 4 (four) times daily as needed (pain).) 400 g 2   enzalutamide (XTANDI) 40 MG tablet Take 4 tablets (160 mg total) by mouth daily. 120 tablet 1   fluticasone (FLONASE) 50 MCG/ACT nasal spray PLACE 1 SPRAY INTO BOTH NOSTRILS DAILY AS  NEEDED FOR ALLERGIES OR RHINITIS (AT BEDTIME). 48 mL 1   furosemide (LASIX) 20 MG tablet TAKE 1 TABLET BY MOUTH DAILY 100 tablet 2   gabapentin (NEURONTIN) 300 MG capsule Take 1 capsule (300 mg total) by mouth at bedtime. 30 capsule 3   Melatonin 10 MG CAPS Take 20 mg by mouth at bedtime.     metoprolol tartrate (LOPRESSOR) 25 MG tablet TAKE 1 TABLET BY MOUTH TWICE  DAILY 200 tablet 2   Multiple Vitamin (MULTIVITAMIN WITH MINERALS) TABS tablet Take 0.5 tablets  by mouth daily.     omeprazole (PRILOSEC) 20 MG capsule TAKE 1 CAPSULE BY MOUTH DAILY  BEFORE BREAKFAST 100 capsule 2   Spacer/Aero-Holding Deretha Emory DEVI To use with inhalers 1 Device 0   No current facility-administered medications for this visit.    HISTORY OF PRESENT ILLNESS:   Oncology History  Malignant neoplasm of prostate (HCC)  02/23/2014 Initial Diagnosis   Malignant neoplasm of prostate       REVIEW OF SYSTEMS:   Constitutional: Denies fevers, chills or abnormal weight loss Eyes: Denies blurriness of vision Ears, nose, mouth, throat, and face: Denies mucositis or sore throat Respiratory: Denies cough, dyspnea or wheezes Cardiovascular: Denies palpitation, chest discomfort or lower extremity swelling Gastrointestinal:  Denies nausea, heartburn or change in bowel habits Skin: Denies abnormal skin rashes Lymphatics: Denies new lymphadenopathy or easy bruising Neurological:Denies numbness, tingling or new weaknesses Behavioral/Psych: Mood is stable, no new changes  All other systems were reviewed with the patient and are negative.   VITALS:  There were no vitals taken for this visit.  Wt Readings from Last 3 Encounters:  09/09/23 211 lb (95.7 kg)  08/03/23 216 lb 3.2 oz (98.1 kg)  07/27/23 210 lb (95.3 kg)    There is no height or weight on file to calculate BMI.  Performance status (ECOG): {CHL ONC [id number]  PHYSICAL EXAM:   GENERAL:alert, no distress and comfortable SKIN: skin color, texture, turgor are normal, no rashes or significant lesions EYES: normal, Conjunctiva are pink and non-injected, sclera clear OROPHARYNX:no exudate, no erythema and lips, buccal mucosa, and tongue normal  NECK: supple, thyroid normal size, non-tender, without nodularity LYMPH:  no palpable lymphadenopathy in the cervical, axillary or inguinal LUNGS: clear to auscultation and percussion with normal breathing effort HEART: regular rate & rhythm and no murmurs and no lower  extremity edema ABDOMEN:abdomen soft, non-tender and normal bowel sounds Musculoskeletal:no cyanosis of digits and no clubbing  NEURO: alert & oriented x 3 with fluent speech, no focal motor/sensory deficits  LABORATORY DATA:  I have reviewed the data as listed    Component Value Date/Time   NA 140 09/09/2023 1026   NA 141 08/27/2017 0915   K 4.4 09/09/2023 1026   K 4.8 08/27/2017 0915   CL 102 09/09/2023 1026   CO2 20 09/09/2023 1026   CO2 26 08/27/2017 0915   GLUCOSE 103 (H) 09/09/2023 1026   GLUCOSE 111 (H) 08/03/2023 0955   GLUCOSE 108 08/27/2017 0915   BUN 17 09/09/2023 1026   BUN 16.2 08/27/2017 0915   CREATININE 0.89 09/09/2023 1026   CREATININE 0.91 08/03/2023 0955   CREATININE 1.07 07/31/2020 1325   CREATININE 0.9 08/27/2017 0915   CALCIUM 9.8 09/09/2023 1026   CALCIUM 9.8 08/27/2017 0915   PROT 6.9 08/03/2023 0955   PROT 7.3 08/27/2017 0915   ALBUMIN 4.3 08/03/2023 0955   ALBUMIN 4.1 08/27/2017 0915   AST 22 08/03/2023 0955   AST 23 08/27/2017 0915  ALT 16 08/03/2023 0955   ALT 17 08/27/2017 0915   ALKPHOS 99 08/03/2023 0955   ALKPHOS 83 08/27/2017 0915   BILITOT 0.8 08/03/2023 0955   BILITOT 0.95 08/27/2017 0915   GFRNONAA >60 08/03/2023 0955   GFRNONAA 66 07/31/2020 1325   GFRAA 77 07/31/2020 1325    No results found for: "SPEP", "UPEP"  Lab Results  Component Value Date   WBC 5.3 08/03/2023   NEUTROABS 3.2 08/03/2023   HGB 13.1 08/03/2023   HCT 40.7 08/03/2023   MCV 86.2 08/03/2023   PLT 253 08/03/2023      Chemistry      Component Value Date/Time   NA 140 09/09/2023 1026   NA 141 08/27/2017 0915   K 4.4 09/09/2023 1026   K 4.8 08/27/2017 0915   CL 102 09/09/2023 1026   CO2 20 09/09/2023 1026   CO2 26 08/27/2017 0915   BUN 17 09/09/2023 1026   BUN 16.2 08/27/2017 0915   CREATININE 0.89 09/09/2023 1026   CREATININE 0.91 08/03/2023 0955   CREATININE 1.07 07/31/2020 1325   CREATININE 0.9 08/27/2017 0915   GLU 105 01/10/2016 0000       Component Value Date/Time   CALCIUM 9.8 09/09/2023 1026   CALCIUM 9.8 08/27/2017 0915   ALKPHOS 99 08/03/2023 0955   ALKPHOS 83 08/27/2017 0915   AST 22 08/03/2023 0955   AST 23 08/27/2017 0915   ALT 16 08/03/2023 0955   ALT 17 08/27/2017 0915   BILITOT 0.8 08/03/2023 0955   BILITOT 0.95 08/27/2017 0915       RADIOGRAPHIC STUDIES: I have personally reviewed the radiological images as listed and agreed with the findings in the report. CT Angio Abd/Pel w/ and/or w/o Result Date: 10/27/2023 CLINICAL DATA:  Abdominal aortic aneurysm EXAM: CTA ABDOMEN AND PELVIS WITHOUT AND WITH CONTRAST TECHNIQUE: Multidetector CT imaging of the abdomen and pelvis was performed using the standard protocol during bolus administration of intravenous contrast. Multiplanar reconstructed images and MIPs were obtained and reviewed to evaluate the vascular anatomy. RADIATION DOSE REDUCTION: This exam was performed according to the departmental dose-optimization program which includes automated exposure control, adjustment of the mA and/or kV according to patient size and/or use of iterative reconstruction technique. CONTRAST:  80mL ISOVUE-370 IOPAMIDOL (ISOVUE-370) INJECTION 76% COMPARISON:  August 14, 2022 FINDINGS: VASCULAR Aorta: 4.2 x 3.9 cm infrarenal abdominal aortic aneurysm. Minimal increased compared with prior examination when it measured 3.9 x 3.7 cm. Celiac: Normal mild arteriosclerosis SMA: Normal minimal arteriosclerosis Renals: Patent with minimal arteriosclerosis IMA: Occluded Inflow: Proximal Outflow: Veins: Unremarkable Review of the MIP images confirms the above findings. 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  No biliary dilatation. Pancreas: Diffuse pancreatic atrophy Spleen: Normal no masses. Adrenals/Urinary Tract: Normal no masses Kidneys normal size. 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 When malignant, nearly all are indolent. Generally, Bosniak IIF masses are followed by imaging at 6 months and 12 months (MRI preferred over CT), then annually for a total of 5 years to assess for morphologic change. (Reference: Bosniak Classification of Cystic Renal Masses, Version 2019. 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 Minimal increased compared with prior examination when it measured 3.9 x 3.7 cm.Recommend follow-up CT or MR as appropriate in 12 months and referral to or continued care with vascular specialist. (Ref.: J Vasc Surg. 2018; 67:2-77 and J Am Coll Radiol 2013;10(10):789-794.) (Ref.: J Vasc Surg. 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 *Small bilateral inguinal scrotal hernias, right larger than left containing fat only. *Radiation therapy seeds in the prostate gland. *Moderate amount of residual fecal material throughout the colon without inflammatory changes. 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 NON-VASCULAR Electronically Signed   By: Shaaron Adler M.D.   On: 10/27/2023 15:04   CT ANGIO CHEST AORTA W/CM & OR WO/CM Result Date: 10/27/2023 CLINICAL DATA:  Ascending aortic aneurysm EXAM: CT ANGIOGRAPHY CHEST WITH CONTRAST TECHNIQUE: Multidetector CT imaging of the chest was  performed using the standard protocol during bolus administration of intravenous contrast. Multiplanar CT image reconstructions and MIPs were obtained to evaluate the vascular anatomy. RADIATION DOSE REDUCTION: This exam was performed according to the departmental dose-optimization program which includes automated exposure control, adjustment of the mA and/or kV according to patient size and/or use of iterative reconstruction technique. CONTRAST:  80mL ISOVUE-370 IOPAMIDOL (ISOVUE-370) INJECTION 76% COMPARISON:  November 28, 2021 FINDINGS: Cardiovascular: Ascending aorta measures 4.4 x 4.6 cm proximally and 4.7 x 4.3 cm distally. Aortic arch is normal. Proximal descending thoracic aorta measures 3.3 x 3.3 cm proximally and 3.3 x 3.2 cm distally. No evidence of dissection. Findings basically unchanged since prior examination. Significant coronary artery calcifications. Heart normal size No pericardial effusions Mediastinum/Nodes: No mediastinal masses or adenopathy. Lungs/Pleura: Prominence of the interstitial markings with thickening of the interlobar and interlobular septi consistent with chronic interstitial lung disease with advanced centrilobular emphysema. No masses No pulmonary nodules.  No infiltrates or consolidations Upper Abdomen: Prior cholecystectomy.  Normal adrenal glands. Musculoskeletal: no fractures. Multilevel degenerative disc disease with narrowing of the mid and lower thoracic intervertebral disc spaces. Minimal anterior osteophytic changes Review of the MIP images confirms the above findings. IMPRESSION: Stable mild aneurysmal dilatation of the ascending aorta unchanged since prior examination No acute cardiopulmonary disease Chronic interstitial lung disease COPD and bullous changes with advanced centrilobular emphysema Electronically Signed   By: Shaaron Adler M.D.   On: 10/27/2023 14:43

## 2023-11-01 NOTE — ED Triage Notes (Signed)
Pt reports that he fell today. States that he was in basement and lost balance. States that he hit the back of his head. States that he also has pain in his right side near his collar bone. Pt is on blood thinner and has a pacemaker. Pt reports baby aspirin. Reports that he also has COPD

## 2023-11-01 NOTE — Assessment & Plan Note (Deleted)
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 -Due to biochemical relapse in 2017, he re-started ADT -Zytiga with prednisone started in August 2017, discontinued in October 2023 because of insurance coverage. -He was switched to Xtandi 160 mg daily in October 2023 -He has responded very well so far, PSA undetectable. -Last CT scan in May and December 2023 showed no evidence of metastasis.

## 2023-11-02 ENCOUNTER — Telehealth: Payer: Self-pay | Admitting: Nurse Practitioner

## 2023-11-02 ENCOUNTER — Inpatient Hospital Stay: Payer: Medicare Other | Admitting: Nurse Practitioner

## 2023-11-02 ENCOUNTER — Inpatient Hospital Stay: Payer: Medicare Other

## 2023-11-02 DIAGNOSIS — S42017A Nondisplaced fracture of sternal end of right clavicle, initial encounter for closed fracture: Secondary | ICD-10-CM | POA: Diagnosis not present

## 2023-11-02 DIAGNOSIS — C61 Malignant neoplasm of prostate: Secondary | ICD-10-CM

## 2023-11-02 NOTE — Telephone Encounter (Signed)
Patient's daughter is aware of rescheduled appointment times/dates due to patients recent fall

## 2023-11-02 NOTE — Discharge Instructions (Addendum)
Recommend extra strength Tylenol 2 tablets every 8 hours.  Careful not to fall.  Wear the arm sling is much as possible for the right clavicle fracture.  Keep your dressing on your left forearm for the next 2 days and you can take it off redress with antibiotic ointment and nonadherent dressing.  Make an appointment to follow-up with sports medicine.  For the clavicle fracture.

## 2023-11-03 ENCOUNTER — Other Ambulatory Visit: Payer: Self-pay | Admitting: *Deleted

## 2023-11-03 DIAGNOSIS — I7143 Infrarenal abdominal aortic aneurysm, without rupture: Secondary | ICD-10-CM

## 2023-11-03 DIAGNOSIS — I7121 Aneurysm of the ascending aorta, without rupture: Secondary | ICD-10-CM

## 2023-11-04 ENCOUNTER — Other Ambulatory Visit: Payer: Self-pay

## 2023-11-04 ENCOUNTER — Other Ambulatory Visit: Payer: Self-pay | Admitting: Family Medicine

## 2023-11-04 ENCOUNTER — Other Ambulatory Visit: Payer: Self-pay | Admitting: Hematology

## 2023-11-04 ENCOUNTER — Other Ambulatory Visit (HOSPITAL_COMMUNITY): Payer: Self-pay

## 2023-11-04 DIAGNOSIS — C61 Malignant neoplasm of prostate: Secondary | ICD-10-CM

## 2023-11-04 MED ORDER — ENZALUTAMIDE 40 MG PO TABS
160.0000 mg | ORAL_TABLET | Freq: Every day | ORAL | 1 refills | Status: DC
Start: 2023-11-04 — End: 2023-11-15
  Filled 2023-11-04: qty 120, 30d supply, fill #0

## 2023-11-04 NOTE — Progress Notes (Signed)
Specialty Pharmacy Refill Coordination Note  Tony [name] is a 83 y.o. male contacted today regarding refills of specialty medication(s) Enzalutamide [name]   Patient requested Delivery   Delivery date: 11/09/23   Verified address: [address]   [place] [address]   Medication will be filled on 02.24.25.   This fill date is pending response to refill request from provider. Patient is aware and if they have not received fill by intended date they must follow up with pharmacy.

## 2023-11-05 NOTE — Telephone Encounter (Signed)
It seems like he has refills available at his pharmacy. Thanks, BJ

## 2023-11-08 ENCOUNTER — Other Ambulatory Visit: Payer: Self-pay

## 2023-11-10 NOTE — Progress Notes (Signed)
 ACUTE VISIT No chief complaint on file.  HPI: [name] is a 83 y.o. 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   Review of Systems See other pertinent positives and negatives in HPI.  Current Outpatient Medications on File Prior to Visit  Medication Sig Dispense Refill   acetaminophen (TYLENOL) 500 MG tablet Take 2 tablets (1,000 mg total) by mouth 3 (three) times daily. 30 tablet 0   albuterol (VENTOLIN HFA) 108 (90 Base) MCG/ACT inhaler TAKE 2 PUFFS BY MOUTH EVERY 6 HOURS AS NEEDED FOR WHEEZE OR SHORTNESS OF BREATH 8.5 each 12   ALPRAZolam (XANAX) 1 MG tablet TAKE 1/2 TABLET TWICE DAILY AS NEEDED. NEXT REFILL 03/17/23 30 tablet 3   aspirin EC 81 MG tablet Take 81 mg by mouth daily.     atorvastatin (LIPITOR) 20 MG tablet TAKE 1 TABLET BY MOUTH ONCE  DAILY 100 tablet 2   benzonatate (TESSALON) 100 MG capsule TAKE 1 CAPSULE BY MOUTH EVERY DAY AS NEEDED 30 capsule 1   budesonide-formoterol (SYMBICORT) 160-4.5 MCG/ACT inhaler 1 puff then rinse mouth, twice daily 10.2 g 6   citalopram (CELEXA) 10 MG tablet TAKE 1 TABLET BY MOUTH DAILY 100 tablet 2   diclofenac Sodium (VOLTAREN) 1 % GEL AAPLY 4 GRAMS TOPICALLY 4 TIMES DAILY (Patient taking differently: Apply 1 Application topically 4 (four) times daily as needed (pain).) 400 g 2   enzalutamide (XTANDI) 40 MG tablet Take 4 tablets (160 mg total) by mouth daily. 120 tablet 1   fluticasone (FLONASE) 50 MCG/ACT nasal spray PLACE 1 SPRAY INTO BOTH NOSTRILS DAILY AS NEEDED FOR ALLERGIES OR RHINITIS (AT BEDTIME). 48 mL 1   furosemide (LASIX) 20 MG tablet TAKE 1 TABLET BY MOUTH DAILY 100 tablet 2   gabapentin (NEURONTIN) 300 MG capsule Take 1 capsule (300 mg total) by mouth at bedtime. 30 capsule 3   Melatonin 10 MG CAPS Take 20 mg by mouth at  bedtime.     metoprolol tartrate (LOPRESSOR) 25 MG tablet TAKE 1 TABLET BY MOUTH TWICE  DAILY 200 tablet 2   Multiple Vitamin (MULTIVITAMIN WITH MINERALS) TABS tablet Take 0.5 tablets by mouth daily.     omeprazole (PRILOSEC) 20 MG capsule TAKE 1 CAPSULE BY MOUTH DAILY  BEFORE BREAKFAST 100 capsule 2   Spacer/Aero-Holding Deretha Emory DEVI To use with inhalers 1 Device 0   No current facility-administered medications on file prior to visit.    Past Medical History:  Diagnosis Date   Anxiety    Arthritis    COPD (chronic obstructive pulmonary disease) (HCC)    Coronary artery disease    Dyspnea    Gastric outlet obstruction 02/03/2018   [name] 02/03/2018   GERD (gastroesophageal reflux disease)    History of hiatal hernia    small/notes 02/03/2018   Hypertension    no meds   Presence of permanent cardiac pacemaker    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   Gleason 4+5=9, PSA 15.65   Radiation    Sinus problem    SSS (sick sinus syndrome) (HCC)    Allergies  Allergen Reactions   Penicillins Rash    Has patient had a PCN reaction causing immediate rash, facial/tongue/throat swelling, SOB or lightheadedness with hypotension: Yes Has patient had a PCN reaction causing severe rash involving mucus membranes  or skin necrosis: No Has patient had a PCN reaction that required hospitalization: No Has patient had a PCN reaction occurring within the last 10 years: No If all of the above answers are "NO", then may proceed with Cephalosporin use.    Social History   Socioeconomic History   Marital status: Married    Spouse name: Not on file   Number of children: 3   Years of education: Not on file   Highest education level: Not on file  Occupational History   Occupation: Estate agent    Employer: [employer]    Comment: retired   Occupation: Production designer, theatre/television/film    Comment: [employer] [place]ired  Tobacco Use   Smoking status: Former    Types: Cigars    Quit date: 09/14/2012    Years since quitting: 11.1    Smokeless tobacco: Never   Tobacco comments:    little cigars; the small ones"  Vaping Use   Vaping status: Never Used  Substance and Sexual Activity   Alcohol use: Not Currently    Alcohol/week: 14.0 standard drinks of alcohol    Types: 14 Cans of beer per week    Comment: quit in 2000   Drug use: No   Sexual activity: Not Currently  Other Topics Concern   Not on file  Social History Narrative   Lives with wife and 2 dogs in one level home; daughter and her family co-habitate.   Has three daughters, all in [place], supportive. Five grandchildren.   Wants to return to doing silver sneakers once gym re-opens.         Social Drivers of Corporate investment banker Strain: Low Risk  (07/29/2022)   Overall Financial Resource Strain (CARDIA)    Difficulty of Paying Living Expenses: Not hard at all  Recent Concern: Financial Resource Strain - Medium Risk (05/27/2022)   Overall Financial Resource Strain (CARDIA)    Difficulty of Paying Living Expenses: Somewhat hard  Food Insecurity: No Food Insecurity (10/15/2022)   Hunger Vital Sign    Worried About Running Out of Food in the Last Year: Never true    Ran Out of Food in the Last Year: Never true  Transportation Needs: No Transportation Needs (10/15/2022)   PRAPARE - Administrator, Civil Service (Medical): No    Lack of Transportation (Non-Medical): No  Physical Activity: Insufficiently Active (07/29/2022)   Exercise Vital Sign    Days of Exercise per Week: 5 days    Minutes of Exercise per Session: 20 min  Stress: No Stress Concern Present (07/29/2022)   [employer] of Occupational Health - Occupational Stress Questionnaire    Feeling of Stress : Not at all  Social Connections: Socially Integrated (07/29/2022)   Social Connection and Isolation Panel [NHANES]    Frequency of Communication with Friends and Family: More than three times a week    Frequency of Social Gatherings with Friends and Family: More than three  times a week    Attends Religious Services: More than 4 times per year    Active Member of [employer] or Organizations: Yes    Attends Engineer, structural: More than 4 times per year    Marital Status: Married    There were no vitals filed for this visit. There is no height or weight on file to calculate BMI.  Physical Exam  ASSESSMENT AND PLAN:  Mr. Fujiwara was seen today for ER follow up for fall.   There are no diagnoses linked to  this encounter.  No follow-ups on file.  I, Rolla Etienne Wierda, acting as a scribe for Ly Wass [place], MD., have documented all relevant documentation on the behalf of Tony Hopfensperger [place], MD, as directed by  Tony Holwerda [place], MD while in the presence of Tony Klatt [place], MD.   I, Tony Traywick [place], MD, have reviewed all documentation for this visit. The documentation on 11/10/23 for the exam, diagnosis, procedures, and orders are all accurate and complete.  Tony Dooner G. [place], MD  [facility]. [place] office.  Discharge Instructions   None

## 2023-11-12 ENCOUNTER — Encounter: Payer: Self-pay | Admitting: Family Medicine

## 2023-11-12 ENCOUNTER — Ambulatory Visit: Payer: Medicare Other | Admitting: Family Medicine

## 2023-11-12 VITALS — BP 128/80 | HR 70 | Resp 16 | Ht 68.0 in | Wt 215.0 lb

## 2023-11-12 DIAGNOSIS — W19XXXD Unspecified fall, subsequent encounter: Secondary | ICD-10-CM | POA: Diagnosis not present

## 2023-11-12 DIAGNOSIS — F418 Other specified anxiety disorders: Secondary | ICD-10-CM | POA: Diagnosis not present

## 2023-11-12 DIAGNOSIS — S42001D Fracture of unspecified part of right clavicle, subsequent encounter for fracture with routine healing: Secondary | ICD-10-CM | POA: Diagnosis not present

## 2023-11-12 DIAGNOSIS — I495 Sick sinus syndrome: Secondary | ICD-10-CM | POA: Diagnosis not present

## 2023-11-12 DIAGNOSIS — Z95811 Presence of heart assist device: Secondary | ICD-10-CM | POA: Insufficient documentation

## 2023-11-12 DIAGNOSIS — C61 Malignant neoplasm of prostate: Secondary | ICD-10-CM

## 2023-11-12 MED ORDER — CITALOPRAM HYDROBROMIDE 10 MG PO TABS: 20.0000 mg | ORAL_TABLET | Freq: Every day | ORAL | Status: DC

## 2023-11-12 NOTE — Assessment & Plan Note (Addendum)
 Currently on Xtandi 40 mg 4 tabs daily and Eligard 22.5 mg q 3 months. Follows with Dr Mosetta Putt, oncologist.

## 2023-11-12 NOTE — Assessment & Plan Note (Signed)
 S/p dual pacemaker placement. Following with cardiologist.

## 2023-11-12 NOTE — Assessment & Plan Note (Signed)
 S/P pacemaker placement for sinus sick synd. Follows with cardiologist.

## 2023-11-12 NOTE — Assessment & Plan Note (Addendum)
 He has been weaning alprazolam, currently taking it every 2-3 days for the past 2 weeks. He will try to discontinue Alprazolam now, instructed to let me know if any withdrawal-like symptoms. Celexa increased from 10 mg to 20 mg. Instructed about warning signs. Follow-up in 2 months, before if needed.

## 2023-11-12 NOTE — Patient Instructions (Addendum)
 A few things to remember from today's visit:  Fall, subsequent encounter  Other specified anxiety disorders  Closed nondisplaced fracture of right clavicle with routine healing, unspecified part of clavicle, subsequent encounter Stop Alprazolam since you have been weaning it off. Celexa can be increased to 2 tabs daily. No changes in rest.  If you need refills for medications you take chronically, please call your pharmacy. Do not use My Chart to request refills or for acute issues that need immediate attention. If you send a my chart message, it may take a few days to be addressed, specially if I am not in the office.  Please be sure medication list is accurate. If a new problem present, please set up appointment sooner than planned today.

## 2023-11-14 ENCOUNTER — Other Ambulatory Visit: Payer: Self-pay | Admitting: Nurse Practitioner

## 2023-11-14 DIAGNOSIS — C61 Malignant neoplasm of prostate: Secondary | ICD-10-CM

## 2023-11-14 NOTE — Progress Notes (Unsigned)
 Patient Care Team: [place], [name], MD as PCP - General (Family Medicine) Croitoru, Rachelle Hora, MD as PCP - Cardiology (Cardiology) Verner Chol, [facility] (Inactive) as Pharmacist (Pharmacist) Malachy Mood, MD as Attending Physician (Hematology and Oncology)  Clinic Day:  11/15/2023  Referring physician: [place], [name], MD  ASSESSMENT & PLAN:   Assessment & Plan: Malignant neoplasm of prostate Tony [name]) -initially [id number] diagnosed in 2015, with Gleason score 4+5 = 9 and a PSA 15.7  -Status post radiation therapy with external beam and seed implant brachytherapy boost completed in October 2015 -He received 1 year androgen deprivation in 2016. -Due to biochemical relapse in 2017, he re-started ADT -Zytiga with prednisone started in August 2017, discontinued in October 2023 because of insurance coverage. -He was switched to Xtandi 160 mg daily in October 2023 -He has responded very well so far, PSA undetectable. -Last CT scan in May and December 2023 showed no evidence of metastasis. -had CTA abdomen an pelvis which shows slightly increased size of infrarenal AAA. He is followed by vascular surgery.  -CT abdomen and pelvis 10/27/2023 showed radiation sees in the prostate but no mentioned evidence of recurrence or metastatic disease.  Continue with Eligard every 3 months and xtandi 1000 mg daily at home.     Fracture of left clavicle after fall at home -fell on basement stairs on 10/29/2023. Currently in sling.  -reviewed imaging studies from ED done 11/01/2023.  -moderate bruising still noted.  -discussed fall prevention in the home.  -will be followed by PCP  Plan: Labs reviewed. Stable.  PSA results are pending.  Reviewed CTA abd and pelvis which does show slightly increased size of AAA (infrarenal). He is followed by vascular surgery.  Proceed with Eligard injection today.  Continue with Xtandi 1000 mg daily at home. New prescription requested and sent today  Labs, follow up, and next  Eligard in 3 months.   The patient understands the plans discussed today and is in agreement with them.  He knows to contact our office if he develops concerns prior to his next appointment.  I provided 25 minutes of face-to-face time during this encounter and > 50% was spent counseling as documented under my assessment and plan.    Carlean Jews, NP  [facility] - A DEPT OF [name] [address] [place] [address] Dept: [phone] Dept Fax: [phone]   No orders of the defined types were placed in this encounter.     CHIEF COMPLAINT:  CC: Malignant neoplasm of the prostate  Current Treatment: Eligard 22.5 mg every 3 months, Xtandi 100 mg daily  INTERVAL HISTORY:  Tony [name] is here today for repeat clinical assessment.  Last seen by myself on 08/03/2023.  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  Takes gabapentin at night to relive symptoms. Had a fall at home on 10/29/2023. Fractured the right clavicle. Has significant bruising to the right side of his chest and left elbow. Currently, right arm is in a sling. Primary care has asked that he begin to use his arm without the sling, little by little. Still very sore. He has no other concerns or complaints today. Continues to use a walking stick to help with balance and mobility. He denies chest pain, chest pressure, or shortness of breath. He denies headaches or visual disturbances. He denies abdominal pain, nausea, vomiting, or changes in bowel or bladder habits.  He denies  fevers or chills. His appetite is good. His weight has been stable.   I have reviewed the past medical history, past surgical history, social history and family history with the patient and they are unchanged from previous note.  ALLERGIES:  is allergic to penicillins.  MEDICATIONS:  Current Outpatient Medications  Medication Sig Dispense Refill    acetaminophen (TYLENOL) 500 MG tablet Take 2 tablets (1,000 mg total) by mouth 3 (three) times daily. 30 tablet 0   albuterol (VENTOLIN HFA) 108 (90 Base) MCG/ACT inhaler TAKE 2 PUFFS BY MOUTH EVERY 6 HOURS AS NEEDED FOR WHEEZE OR SHORTNESS OF BREATH 8.5 each 12   aspirin EC 81 MG tablet Take 81 mg by mouth daily.     atorvastatin (LIPITOR) 20 MG tablet TAKE 1 TABLET BY MOUTH ONCE  DAILY 100 tablet 2   benzonatate (TESSALON) 100 MG capsule TAKE 1 CAPSULE BY MOUTH EVERY DAY AS NEEDED 30 capsule 1   budesonide-formoterol (SYMBICORT) 160-4.5 MCG/ACT inhaler 1 puff then rinse mouth, twice daily 10.2 g 6   citalopram (CELEXA) 10 MG tablet Take 2 tablets (20 mg total) by mouth daily.     diclofenac Sodium (VOLTAREN) 1 % GEL AAPLY 4 GRAMS TOPICALLY 4 TIMES DAILY (Patient taking differently: Apply 1 Application topically 4 (four) times daily as needed (pain).) 400 g 2   fluticasone (FLONASE) 50 MCG/ACT nasal spray PLACE 1 SPRAY INTO BOTH NOSTRILS DAILY AS NEEDED FOR ALLERGIES OR RHINITIS (AT BEDTIME). 48 mL 1   furosemide (LASIX) 20 MG tablet TAKE 1 TABLET BY MOUTH DAILY 100 tablet 2   gabapentin (NEURONTIN) 300 MG capsule Take 1 capsule (300 mg total) by mouth at bedtime. 30 capsule 3   Melatonin 10 MG CAPS Take 20 mg by mouth at bedtime.     metoprolol tartrate (LOPRESSOR) 25 MG tablet TAKE 1 TABLET BY MOUTH TWICE  DAILY 200 tablet 2   Multiple Vitamin (MULTIVITAMIN WITH MINERALS) TABS tablet Take 0.5 tablets by mouth daily.     omeprazole (PRILOSEC) 20 MG capsule TAKE 1 CAPSULE BY MOUTH DAILY  BEFORE BREAKFAST 100 capsule 2   Spacer/Aero-Holding Chambers DEVI To use with inhalers 1 Device 0   enzalutamide (XTANDI) 40 MG tablet Take 4 tablets (160 mg total) by mouth daily. 120 tablet 2   No current facility-administered medications for this visit.    HISTORY OF PRESENT ILLNESS:   Oncology History  Malignant neoplasm of prostate (HCC)  02/23/2014 Initial Diagnosis   Malignant neoplasm of prostate        REVIEW OF SYSTEMS:   Constitutional: Denies fevers, chills or abnormal weight loss Eyes: Denies blurriness of vision Ears, nose, mouth, throat, and face: Denies mucositis or sore throat Respiratory: Denies cough, dyspnea or wheezes Cardiovascular: Denies palpitation, chest discomfort or lower extremity swelling Gastrointestinal:  Denies nausea, heartburn or change in bowel habits Skin: Denies abnormal skin rashes Lymphatics: Denies new lymphadenopathy or easy bruising Neurological:Denies numbness, tingling or new weaknesses Behavioral/Psych: Mood is stable, no new changes  All other systems were reviewed with the patient and are negative.   VITALS:   Today's Vitals   11/15/23 1245  BP: 134/82  Pulse: 86  Resp: 19  Temp: 97.8 F (36.6 C)  TempSrc: Temporal  SpO2: 96%  Weight: 217 lb 4.8 oz (98.6 kg)  Height: 5\' 8"  (1.727 m)   Body mass index is 33.04 kg/m.   Wt Readings from Last 3 Encounters:  11/15/23 217 lb 4.8 oz (98.6 kg)  11/12/23 215 lb (97.5  kg)  11/01/23 209 lb (94.8 kg)    Body mass index is 33.04 kg/m.  Performance status (ECOG): 1 - Symptomatic but completely ambulatory  PHYSICAL EXAM:   GENERAL:alert, no distress and comfortable SKIN: skin color, texture, turgor are normal, no rashes or significant lesions EYES: normal, Conjunctiva are pink and non-injected, sclera clear OROPHARYNX:no exudate, no erythema and lips, buccal mucosa, and tongue normal  NECK: supple, thyroid normal size, non-tender, without nodularity LYMPH:  no palpable lymphadenopathy in the cervical, axillary or inguinal LUNGS: clear to auscultation and percussion with normal breathing effort HEART: regular rate & rhythm and no murmurs and no lower extremity edema ABDOMEN:abdomen soft, non-tender and normal bowel sounds Musculoskeletal:no cyanosis of digits and no clubbing. Bruising noted to right chest and left elbow. Reduced ROM of right arm due to pain.  NEURO: alert &  oriented x 3 with fluent speech, no focal motor/sensory deficits  LABORATORY DATA:  I have reviewed the data as listed    Component Value Date/Time   NA 137 11/15/2023 1218   NA 140 09/09/2023 1026   NA 141 08/27/2017 0915   K 4.0 11/15/2023 1218   K 4.8 08/27/2017 0915   CL 106 11/15/2023 1218   CO2 25 11/15/2023 1218   CO2 26 08/27/2017 0915   GLUCOSE 112 (H) 11/15/2023 1218   GLUCOSE 108 08/27/2017 0915   BUN 22 11/15/2023 1218   BUN 17 09/09/2023 1026   BUN 16.2 08/27/2017 0915   CREATININE 0.91 11/15/2023 1218   CREATININE 1.07 07/31/2020 1325   CREATININE 0.9 08/27/2017 0915   CALCIUM 9.2 11/15/2023 1218   CALCIUM 9.8 08/27/2017 0915   PROT 6.6 11/15/2023 1218   PROT 7.3 08/27/2017 0915   ALBUMIN 4.1 11/15/2023 1218   ALBUMIN 4.1 08/27/2017 0915   AST 14 (L) 11/15/2023 1218   AST 23 08/27/2017 0915   ALT 9 11/15/2023 1218   ALT 17 08/27/2017 0915   ALKPHOS 115 11/15/2023 1218   ALKPHOS 83 08/27/2017 0915   BILITOT 0.6 11/15/2023 1218   BILITOT 0.95 08/27/2017 0915   GFRNONAA >60 11/15/2023 1218   GFRNONAA 66 07/31/2020 1325   GFRAA 77 07/31/2020 1325    Lab Results  Component Value Date   WBC 6.5 11/15/2023   NEUTROABS 4.3 11/15/2023   HGB 12.8 (L) 11/15/2023   HCT 39.4 11/15/2023   MCV 87.9 11/15/2023   PLT 272 11/15/2023       RADIOGRAPHIC STUDIES: DG Shoulder Right Result Date: 11/01/2023 CLINICAL DATA:  Fall with pain EXAM: RIGHT SHOULDER - 2+ VIEW COMPARISON:  None Available. FINDINGS: No shoulder joint dislocation or fracture. Chronic narrowing of the humeral acromial distance probably relates to chronic rotator cuff disease. Chronic degenerative disease of the [facility] joint. Fracture of the proximal right clavicle, best shown on the cervical spine CT. IMPRESSION: 1. No shoulder joint dislocation or fracture. Chronic narrowing of the humeral acromial distance probably relates to chronic rotator cuff disease. 2. Fracture of the proximal right clavicle,  best shown on the cervical spine CT. Electronically Signed   By: Paulina Fusi M.D.   On: 11/01/2023 21:17   DG Clavicle Right Result Date: 11/01/2023 CLINICAL DATA:  Fall.  Pain and deformity. EXAM: RIGHT CLAVICLE - 2+ VIEWS COMPARISON:  Cervical CT FINDINGS: Fracture of the proximal right clavicle. This is poorly demonstrated on these images based on projection an overlying mediastinal density. Better shown on the cervical CT study. IMPRESSION: Fracture of the proximal right clavicle. Poorly demonstrated on these  images based on projection and overlying mediastinal density. Better shown on the cervical CT study. Electronically Signed   By: Paulina Fusi M.D.   On: 11/01/2023 21:15   DG Chest Port 1 View Result Date: 11/01/2023 CLINICAL DATA:  Larey Seat EXAM: PORTABLE CHEST 1 VIEW COMPARISON:  10/26/2022 FINDINGS: Cardiomegaly. Dual lead pacemaker. No active cardiopulmonary disease. Known right clavicle fracture, difficult to appreciate on this image. IMPRESSION: No active disease. Cardiomegaly. Dual lead pacemaker. Right clavicle fracture difficult to see on this image. Electronically Signed   By: Paulina Fusi M.D.   On: 11/01/2023 21:15   CT Cervical Spine Wo Contrast Result Date: 11/01/2023 CLINICAL DATA:  Larey Seat.  Trauma to the head and neck. EXAM: CT CERVICAL SPINE WITHOUT CONTRAST TECHNIQUE: Multidetector CT imaging of the cervical spine was performed without intravenous contrast. Multiplanar CT image reconstructions were also generated. RADIATION DOSE REDUCTION: This exam was performed according to the departmental dose-optimization program which includes automated exposure control, adjustment of the mA and/or kV according to patient size and/or use of iterative reconstruction technique. COMPARISON:  None Available. FINDINGS: Alignment: Chronic scoliotic curvature.  No traumatic malalignment. Skull base and vertebrae: No regional fracture or focal bone lesion. Soft tissues and spinal canal: Acute fracture of  the right clavicle with regional hematoma. Soft tissues of the neck do not show any traumatic finding. Disc levels: Chronic degenerative change at the C1-2 joint. Apparent fusion of the skull base and C1. Degenerative spondylosis and facet arthritis below that throughout the cervical region. No likely compressive central canal stenosis. Foraminal narrowing which could cause neural compression most notably on the right at C2-3, C5-6 and C6-7 and on the left at C2-3 and C3-4. Upper chest: Right clavicle fracture as noted above. Emphysema of the upper lungs without acute process. Other: None IMPRESSION: 1. No acute or traumatic finding of the cervical spine. Chronic scoliotic curvature. 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 Foraminal narrowing which could cause neural compression most notably on the right at C2-3, C5-6 and C6-7 and on the left at C2-3 and C3-4. 2. Acute fracture of the right clavicle with regional hematoma. 3. Emphysema of the upper lungs. Electronically Signed   By: Paulina Fusi M.D.   On: 11/01/2023 21:14   CT Head Wo Contrast Result Date: 11/01/2023 CLINICAL DATA:  Larey Seat with trauma to the back of the head EXAM: CT HEAD WITHOUT CONTRAST TECHNIQUE: Contiguous axial images were obtained from the base of the skull through the vertex without intravenous contrast. RADIATION DOSE REDUCTION: This exam was performed according to the departmental dose-optimization program which includes automated exposure control, adjustment of the mA and/or kV according to patient size and/or use of iterative reconstruction technique. COMPARISON:  None Available. FINDINGS: Brain: No acute traumatic finding. No focal abnormality affects the brainstem or cerebellum. 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 Chronic small-vessel ischemic changes affect the cerebral hemispheric white matter. No acute infarction, mass lesion, hemorrhage, hydrocephalus or extra-axial collection.  Vascular: There is atherosclerotic calcification of the major vessels at the base of the brain. Skull: Negative Sinuses/Orbits: Clear/normal Other: None IMPRESSION: No acute or traumatic finding. 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 Chronic small-vessel ischemic changes of the cerebral hemispheric white matter. Electronically Signed   By: Paulina Fusi M.D.   On: 11/01/2023 21:10   CT Angio Abd/Pel w/ and/or w/o Result Date: 10/27/2023 CLINICAL DATA:  Abdominal aortic aneurysm EXAM: CTA ABDOMEN AND PELVIS WITHOUT AND WITH CONTRAST TECHNIQUE: Multidetector CT imaging of  the abdomen and pelvis was performed using the standard protocol during bolus administration of intravenous contrast. Multiplanar reconstructed images and MIPs were obtained and reviewed to evaluate the vascular anatomy. RADIATION DOSE REDUCTION: This exam was performed according to the departmental dose-optimization program which includes automated exposure control, adjustment of the mA and/or kV according to patient size and/or use of iterative reconstruction technique. CONTRAST:  80mL ISOVUE-370 IOPAMIDOL (ISOVUE-370) INJECTION 76% COMPARISON:  August 14, 2022 FINDINGS: VASCULAR Aorta: 4.2 x 3.9 cm infrarenal abdominal aortic aneurysm. Minimal increased compared with prior examination when it measured 3.9 x 3.7 cm. Celiac: Normal mild arteriosclerosis SMA: Normal minimal arteriosclerosis Renals: Patent with minimal arteriosclerosis IMA: Occluded Inflow: Proximal Outflow: Veins: Unremarkable Review of the MIP images confirms the above findings. 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  No biliary dilatation. Pancreas: Diffuse pancreatic atrophy Spleen: Normal no masses. Adrenals/Urinary Tract: Normal no masses Kidneys normal size. 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 When malignant, nearly all are indolent. Generally, Bosniak IIF masses are followed by imaging at 6 months and 12 months (MRI preferred over CT), then annually for a total of 5 years to assess for morphologic change. (Reference: Bosniak Classification of Cystic Renal Masses, Version 2019. 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 Minimal increased compared with prior examination when it measured 3.9 x 3.7 cm.Recommend follow-up CT or MR as appropriate in 12 months and referral to or continued care with vascular specialist. (Ref.: J Vasc Surg. 2018; 67:2-77 and J Am Coll Radiol 2013;10(10):789-794.) (Ref.: J Vasc Surg. 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 *Small bilateral inguinal scrotal hernias, right larger than left containing fat only. *Radiation therapy seeds in the prostate gland. *Moderate amount of residual fecal material throughout the colon without inflammatory changes. 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 NON-VASCULAR Electronically Signed   By: Shaaron Adler M.D.   On: 10/27/2023 15:04   CT ANGIO CHEST AORTA W/CM & OR WO/CM Result Date: 10/27/2023 CLINICAL DATA:  Ascending aortic aneurysm EXAM: CT ANGIOGRAPHY CHEST WITH CONTRAST TECHNIQUE: Multidetector CT imaging of the chest was performed using the standard protocol during bolus  administration of intravenous contrast. Multiplanar CT image reconstructions and MIPs were obtained to evaluate the vascular anatomy. RADIATION DOSE REDUCTION: This exam was performed according to the departmental dose-optimization program which includes automated exposure control, adjustment of the mA and/or kV according to patient size and/or use of iterative reconstruction technique. CONTRAST:  80mL ISOVUE-370 IOPAMIDOL (ISOVUE-370) INJECTION 76% COMPARISON:  November 28, 2021 FINDINGS: Cardiovascular: Ascending aorta measures 4.4 x 4.6 cm proximally and 4.7 x 4.3 cm distally. Aortic arch is normal. Proximal descending thoracic aorta measures 3.3 x 3.3 cm proximally and 3.3 x 3.2 cm distally. No evidence of dissection. Findings basically unchanged since prior examination. Significant coronary artery calcifications. Heart normal size No pericardial effusions Mediastinum/Nodes: No mediastinal masses or adenopathy. Lungs/Pleura: Prominence of the  interstitial markings with thickening of the interlobar and interlobular septi consistent with chronic interstitial lung disease with advanced centrilobular emphysema. No masses No pulmonary nodules.  No infiltrates or consolidations Upper Abdomen: Prior cholecystectomy.  Normal adrenal glands. Musculoskeletal: no fractures. Multilevel degenerative disc disease with narrowing of the mid and lower thoracic intervertebral disc spaces. Minimal anterior osteophytic changes Review of the MIP images confirms the above findings. IMPRESSION: Stable mild aneurysmal dilatation of the ascending aorta unchanged since prior examination No acute cardiopulmonary disease Chronic interstitial lung disease COPD and bullous changes with advanced centrilobular emphysema Electronically Signed   By: Shaaron Adler M.D.   On: 10/27/2023 14:43

## 2023-11-14 NOTE — Assessment & Plan Note (Signed)
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 -Due to biochemical relapse in 2017, he re-started ADT -Zytiga with prednisone started in August 2017, discontinued in October 2023 because of insurance coverage. -He was switched to Xtandi 160 mg daily in October 2023 -He has responded very well so far, PSA undetectable. -Last CT scan in May and December 2023 showed no evidence of metastasis.

## 2023-11-15 ENCOUNTER — Encounter: Payer: Self-pay | Admitting: Nurse Practitioner

## 2023-11-15 ENCOUNTER — Inpatient Hospital Stay: Payer: Medicare Other | Admitting: Nurse Practitioner

## 2023-11-15 ENCOUNTER — Other Ambulatory Visit (HOSPITAL_COMMUNITY): Payer: Self-pay

## 2023-11-15 ENCOUNTER — Inpatient Hospital Stay: Payer: Medicare Other

## 2023-11-15 ENCOUNTER — Inpatient Hospital Stay: Payer: Medicare Other | Attending: Hematology

## 2023-11-15 ENCOUNTER — Other Ambulatory Visit: Payer: Self-pay

## 2023-11-15 VITALS — BP 134/82 | HR 86 | Temp 97.8°F | Resp 19 | Ht 68.0 in | Wt 217.3 lb

## 2023-11-15 DIAGNOSIS — I7143 Infrarenal abdominal aortic aneurysm, without rupture: Secondary | ICD-10-CM | POA: Insufficient documentation

## 2023-11-15 DIAGNOSIS — Z5111 Encounter for antineoplastic chemotherapy: Secondary | ICD-10-CM | POA: Insufficient documentation

## 2023-11-15 DIAGNOSIS — C61 Malignant neoplasm of prostate: Secondary | ICD-10-CM

## 2023-11-15 DIAGNOSIS — E669 Obesity, unspecified: Secondary | ICD-10-CM

## 2023-11-15 LAB — CBC WITH DIFFERENTIAL (CANCER CENTER ONLY)
Abs Immature Granulocytes: 0.02 10*3/uL (ref 0.00–0.07)
Basophils Absolute: 0.1 10*3/uL (ref 0.0–0.1)
Basophils Relative: 1 %
Eosinophils Absolute: 0.2 10*3/uL (ref 0.0–0.5)
Eosinophils Relative: 3 %
HCT: 39.4 % (ref 39.0–52.0)
Hemoglobin: 12.8 g/dL — ABNORMAL LOW (ref 13.0–17.0)
Immature Granulocytes: 0 %
Lymphocytes Relative: 21 %
Lymphs Abs: 1.3 10*3/uL (ref 0.7–4.0)
MCH: 28.6 pg (ref 26.0–34.0)
MCHC: 32.5 g/dL (ref 30.0–36.0)
MCV: 87.9 fL (ref 80.0–100.0)
Monocytes Absolute: 0.6 10*3/uL (ref 0.1–1.0)
Monocytes Relative: 9 %
Neutro Abs: 4.3 10*3/uL (ref 1.7–7.7)
Neutrophils Relative %: 66 %
Platelet Count: 272 10*3/uL (ref 150–400)
RBC: 4.48 MIL/uL (ref 4.22–5.81)
RDW: 15.9 % — ABNORMAL HIGH (ref 11.5–15.5)
WBC Count: 6.5 10*3/uL (ref 4.0–10.5)
nRBC: 0 % (ref 0.0–0.2)

## 2023-11-15 LAB — CMP (CANCER CENTER ONLY)
ALT: 9 U/L (ref 0–44)
AST: 14 U/L — ABNORMAL LOW (ref 15–41)
Albumin: 4.1 g/dL (ref 3.5–5.0)
Alkaline Phosphatase: 115 U/L (ref 38–126)
Anion gap: 6 (ref 5–15)
BUN: 22 mg/dL (ref 8–23)
CO2: 25 mmol/L (ref 22–32)
Calcium: 9.2 mg/dL (ref 8.9–10.3)
Chloride: 106 mmol/L (ref 98–111)
Creatinine: 0.91 mg/dL (ref 0.61–1.24)
GFR, Estimated: >60 mL/min (ref >60)
Glucose, Bld: 112 mg/dL — ABNORMAL HIGH (ref 70–99)
Potassium: 4 mmol/L (ref 3.5–5.1)
Sodium: 137 mmol/L (ref 135–145)
Total Bilirubin: 0.6 mg/dL (ref 0.0–1.2)
Total Protein: 6.6 g/dL (ref 6.5–8.1)

## 2023-11-15 MED ORDER — LEUPROLIDE ACETATE (3 MONTH) 22.5 MG ~~LOC~~ KIT
22.5000 mg | PACK | Freq: Once | SUBCUTANEOUS | Status: AC
Start: 2023-11-15 — End: 2023-11-15
  Administered 2023-11-15: 22.5 mg via SUBCUTANEOUS
  Filled 2023-11-15: qty 22.5

## 2023-11-15 MED ORDER — ENZALUTAMIDE 40 MG PO TABS
160.0000 mg | ORAL_TABLET | Freq: Every day | ORAL | 2 refills | Status: DC
  Filled 2023-11-15 – 2023-12-03 (×2): qty 120, 30d supply, fill #0
  Filled 2023-12-30: qty 120, 30d supply, fill #1
  Filled 2024-01-27 (×2): qty 120, 30d supply, fill #2

## 2023-11-16 ENCOUNTER — Telehealth: Payer: Self-pay | Admitting: Nurse Practitioner

## 2023-11-16 LAB — PROSTATE-SPECIFIC AG, SERUM (LABCORP): Prostate Specific Ag, Serum: <0.1 ng/mL (ref 0.0–4.0)

## 2023-11-16 NOTE — Telephone Encounter (Signed)
 Patient's spouse is aware of scheduled appointment times/dates; patient has our callback for scheduling if needing any cancellations or rescheduling

## 2023-11-17 ENCOUNTER — Telehealth: Payer: Self-pay | Admitting: Nurse Practitioner

## 2023-11-17 ENCOUNTER — Telehealth: Payer: Self-pay

## 2023-11-17 NOTE — Telephone Encounter (Signed)
 Reached out to the patient via telephone call, per Heather Boscia,NP. Spoke with patient's wife.  Let the wife know that his PSA was < 0.01. same as it has been, which is what we want to see. We will recheck again at next visit, per [facility] Boscia,NP. Wife verbalized understanding. Wife requesting to have upcoming appt's rescheduled for a earlier time, if possible.  Let wife know that I would forward her message to our scheduling team and they will contact her to further assist.

## 2023-11-17 NOTE — Telephone Encounter (Signed)
 Patient's spouse is aware of rescheduled appointment times/dates per staff message on 11/17/2023

## 2023-11-18 ENCOUNTER — Ambulatory Visit: Admitting: Family Medicine

## 2023-11-19 ENCOUNTER — Ambulatory Visit: Payer: Medicare Other | Admitting: Family Medicine

## 2023-11-23 ENCOUNTER — Other Ambulatory Visit (HOSPITAL_COMMUNITY): Payer: Self-pay

## 2023-11-23 ENCOUNTER — Other Ambulatory Visit: Payer: Self-pay

## 2023-11-30 ENCOUNTER — Other Ambulatory Visit: Payer: Self-pay

## 2023-12-03 ENCOUNTER — Other Ambulatory Visit: Payer: Self-pay

## 2023-12-03 NOTE — Progress Notes (Signed)
 Specialty Pharmacy Refill Coordination Note  Tony [name] is a 83 y.o. male contacted today regarding refills of specialty medication(s) Enzalutamide [name]   Patient requested Delivery   Delivery date: 12/08/23   Verified address: [address]   [place] [phone]   Medication will be filled on 12/07/23.

## 2023-12-07 ENCOUNTER — Other Ambulatory Visit: Payer: Self-pay

## 2023-12-14 ENCOUNTER — Ambulatory Visit (INDEPENDENT_AMBULATORY_CARE_PROVIDER_SITE_OTHER): Payer: Medicare Other

## 2023-12-14 DIAGNOSIS — I495 Sick sinus syndrome: Secondary | ICD-10-CM | POA: Diagnosis not present

## 2023-12-15 LAB — CUP PACEART REMOTE DEVICE CHECK
Battery Remaining Longevity: 103 mo
Battery Voltage: 2.99 V
Brady Statistic AP VP Percent: 0.1 %
Brady Statistic AP VS Percent: 67.44 %
Brady Statistic AS VP Percent: 0.03 %
Brady Statistic AS VS Percent: 32.44 %
Brady Statistic RA Percent Paced: 67.33 %
Brady Statistic RV Percent Paced: 0.13 %
Date Time Interrogation Session: 20250331235030
Implantable Lead Connection Status: 753985
Implantable Lead Connection Status: 753985
Implantable Lead Implant Date: 20191001
Implantable Lead Implant Date: 20191001
Implantable Lead Location: 753859
Implantable Lead Location: 753860
Implantable Lead Model: 5076
Implantable Lead Model: 5076
Implantable Pulse Generator Implant Date: 20191001
Lead Channel Impedance Value: 304 Ohm
Lead Channel Impedance Value: 323 Ohm
Lead Channel Impedance Value: 399 Ohm
Lead Channel Impedance Value: 494 Ohm
Lead Channel Pacing Threshold Amplitude: 0.375 V
Lead Channel Pacing Threshold Amplitude: 0.875 V
Lead Channel Pacing Threshold Pulse Width: 0.4 ms
Lead Channel Pacing Threshold Pulse Width: 0.4 ms
Lead Channel Sensing Intrinsic Amplitude: 2.125 mV
Lead Channel Sensing Intrinsic Amplitude: 2.125 mV
Lead Channel Sensing Intrinsic Amplitude: 2.375 mV
Lead Channel Sensing Intrinsic Amplitude: 2.375 mV
Lead Channel Setting Pacing Amplitude: 1.5 V
Lead Channel Setting Pacing Amplitude: 2 V
Lead Channel Setting Pacing Pulse Width: 0.4 ms
Lead Channel Setting Sensing Sensitivity: 0.9 mV
Zone Setting Status: 755011
Zone Setting Status: 755011

## 2023-12-16 ENCOUNTER — Encounter: Payer: Self-pay | Admitting: Family Medicine

## 2023-12-16 ENCOUNTER — Ambulatory Visit (INDEPENDENT_AMBULATORY_CARE_PROVIDER_SITE_OTHER)

## 2023-12-16 ENCOUNTER — Ambulatory Visit: Admitting: Family Medicine

## 2023-12-16 VITALS — BP 122/80 | HR 73 | Ht 68.0 in | Wt 214.0 lb

## 2023-12-16 DIAGNOSIS — S42001D Fracture of unspecified part of right clavicle, subsequent encounter for fracture with routine healing: Secondary | ICD-10-CM | POA: Diagnosis not present

## 2023-12-16 DIAGNOSIS — S42017A Nondisplaced fracture of sternal end of right clavicle, initial encounter for closed fracture: Secondary | ICD-10-CM | POA: Diagnosis not present

## 2023-12-16 DIAGNOSIS — M19011 Primary osteoarthritis, right shoulder: Secondary | ICD-10-CM | POA: Diagnosis not present

## 2023-12-16 DIAGNOSIS — M898X1 Other specified disorders of bone, shoulder: Secondary | ICD-10-CM

## 2023-12-16 NOTE — Progress Notes (Signed)
 I, Stevenson Clinch, CMA acting as a scribe for Clementeen Graham, MD.  Tony [name] is a 83 y.o. male who presents to [facility] at [facility] today for R proximal clavicle fx occurring on 2/17. Pt lost his balance when in the basement doing laundry, falling backward and to his R side. He was seen at the [facility] ED following the incident.  Today, pt reports continued pain right clavicular region. Bruising has improved. Pt RHD. Pt wearing sling today and ambulating with a cane. Taking Tylenol prn. Notes that fall was from standing height. Pt was sitting waiting for the towels to finish drying. Became unstable upon standing and landed on the right shoulder hitting his head on the floor and cutting the left arm. Hx of prostate cancer, does not think he has had DEXA.   Overall he feels pretty well and is pretty happy with how things are going.  Dx imaging: 11/01/23 R clavicle & R shoulder XR and cervical CT  Pertinent review of systems: No fevers or chills  Relevant historical information: Abdominal aorta COPD heart disease hypertension prostate cancer   Exam:  BP 122/80   Pulse 73   Ht 5\' 8"  (1.727 m)   Wt 214 lb (97.1 kg)   SpO2 97%   BMI 32.54 kg/m  General: Well Developed, well nourished, and in no acute distress.   MSK: Right shoulder normal-appearing Mildly tender palpation at proximal clavicle.  Normal shoulder motion and strength.    Lab and Radiology Results  X-ray images right clavicle obtained today personally and independently interpreted. Clavicle fracture previously seen on CT scan cervical spine not very visible on today's x-ray. Await formal radiology review.  EXAM: CT CERVICAL SPINE WITHOUT CONTRAST   TECHNIQUE: Multidetector CT imaging of the cervical spine was performed without intravenous contrast. Multiplanar CT image reconstructions were also generated.   RADIATION DOSE REDUCTION: This exam was performed according to  the departmental dose-optimization program which includes automated exposure control, adjustment of the mA and/or kV according to patient size and/or use of iterative reconstruction technique.   COMPARISON:  None Available.   FINDINGS: Alignment: Chronic scoliotic curvature.  No traumatic malalignment.   Skull base and vertebrae: No regional fracture or focal bone lesion.   Soft tissues and spinal canal: Acute fracture of the right clavicle with regional hematoma. Soft tissues of the neck do not show any traumatic finding.   Disc levels: Chronic degenerative change at the C1-2 joint. Apparent fusion of the skull base and C1.   Degenerative spondylosis and facet arthritis below that throughout the cervical region. No likely compressive central canal stenosis. Foraminal narrowing which could cause neural compression most notably on the right at C2-3, C5-6 and C6-7 and on the left at C2-3 and C3-4.   Upper chest: Right clavicle fracture as noted above. Emphysema of the upper lungs without acute process.   Other: None   IMPRESSION: 1. No acute or traumatic finding of the cervical spine. Chronic scoliotic curvature. 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 Foraminal narrowing which could cause neural compression most notably on the right at C2-3, C5-6 and C6-7 and on the left at C2-3 and C3-4. 2. Acute fracture of the right clavicle with regional hematoma. 3. Emphysema of the upper lungs.     Electronically Signed   By: Paulina Fusi M.D.   On: 11/01/2023 21:14   I, Clementeen Graham, personally (independently) visualized and performed the interpretation of the images attached  in this note.   Assessment and Plan: 83 y.o. male with right proximal clavicle fracture.  This is not visible much on his plain x-rays due to overlying structures on x-ray films.  It is visible on his CT scan.  However he is not hurting very much and this injury was about 6  weeks ago.  I do not think he needs much more treatment.  He can stop using the sling as guided by comfort.  He has good shoulder motion and strength.  I do not think he is going to need physical therapy.  Check back as needed.   PDMP not reviewed this encounter. Orders Placed This Encounter  Procedures   DG Clavicle Right    Standing Status:   Future    Number of Occurrences:   1    Expiration Date:   12/15/2024    Reason for Exam (SYMPTOM  OR DIAGNOSIS REQUIRED):   right clavicle fracture    Preferred imaging location?:   [facility]   No orders of the defined types were placed in this encounter.    Discussed warning signs or symptoms. Please see discharge instructions. Patient expresses understanding.   The above documentation has been reviewed and is accurate and complete Clementeen Graham, M.D.

## 2023-12-16 NOTE — Patient Instructions (Addendum)
 Thank you for coming in today.   Use the sling as needed  Return in 1 month.   Let me know if this is not working.

## 2023-12-19 ENCOUNTER — Encounter: Payer: Self-pay | Admitting: Cardiovascular Disease

## 2023-12-29 DIAGNOSIS — R051 Acute cough: Secondary | ICD-10-CM | POA: Diagnosis not present

## 2023-12-29 DIAGNOSIS — J209 Acute bronchitis, unspecified: Secondary | ICD-10-CM | POA: Diagnosis not present

## 2023-12-30 ENCOUNTER — Other Ambulatory Visit: Payer: Self-pay | Admitting: Family Medicine

## 2023-12-30 ENCOUNTER — Other Ambulatory Visit: Payer: Self-pay

## 2023-12-30 DIAGNOSIS — R600 Localized edema: Secondary | ICD-10-CM

## 2023-12-30 DIAGNOSIS — R053 Chronic cough: Secondary | ICD-10-CM

## 2023-12-30 NOTE — Progress Notes (Signed)
 Specialty Pharmacy Ongoing Clinical Assessment Note  Tony [name] is a 83 y.o. male who is being followed by the specialty pharmacy service for RxSp Oncology   Patient's specialty medication(s) reviewed today: Enzalutamide [name]   Missed doses in the last 4 weeks: 0   Patient/Caregiver did not have any additional questions or concerns.   Therapeutic benefit summary: Patient is achieving benefit   Adverse events/side effects summary: No adverse events/side effects   Patient's therapy is appropriate to: Continue    Goals Addressed             This Visit's Progress    Slow Disease Progression   On track    Patient is on track. Patient will maintain adherence         Follow up:  6 months  Otto Herb Specialty Pharmacist

## 2023-12-30 NOTE — Progress Notes (Signed)
 Specialty Pharmacy Refill Coordination Note  Tony [name] is a 83 y.o. male contacted today regarding refills of specialty medication(s) Enzalutamide Marvis Sluder)   Patient requested Delivery   Delivery date: 01/04/24   Verified address: [address]   [place] [address]   Medication will be filled on 01/03/24.

## 2024-01-03 ENCOUNTER — Encounter: Payer: Self-pay | Admitting: Family Medicine

## 2024-01-03 ENCOUNTER — Other Ambulatory Visit: Payer: Self-pay

## 2024-01-03 NOTE — Progress Notes (Signed)
 Right collarbone x-ray shows some arthritis at the joint at the chest where the collarbone joins into the sternum.  The fracture is not visible on this x-ray but it was visible on the CT scan that you had earlier.  I think as long as you are getting better we should be good.

## 2024-01-14 ENCOUNTER — Ambulatory Visit: Admitting: Family Medicine

## 2024-01-20 ENCOUNTER — Other Ambulatory Visit: Payer: Self-pay

## 2024-01-20 NOTE — Progress Notes (Signed)
 HPI: Tony [name] is a 83 y.o. male, who is here today to follow on recent OV on 2/28.  Fall: He reports a fall in February, stating this was his first and only fall.  Overall he has recovered well.  He continues to ambulate with a cane.  Pt is established with Dr. Alease Hunter of sports medicine.  Per pt, he will be released from sports med after his visit later today.  He notes he tends to bruise easily.   Hypertension:  Medications:*** BP readings at home:***  Negative for unusual or severe headache, visual changes, exertional chest pain, dyspnea,  focal weakness, or edema.  Lab Results  Component Value Date   NA 137 11/15/2023   CL 106 11/15/2023   K 4.0 11/15/2023   CO2 25 11/15/2023   BUN 22 11/15/2023   CREATININE 0.91 11/15/2023   GFRNONAA >60 11/15/2023   CALCIUM  9.2 11/15/2023   PHOS 4.1 12/31/2021   ALBUMIN 4.1 11/15/2023   GLUCOSE 112 (H) 11/15/2023   Urinary frequency / Hx of prostate cancer: He also reports urinary frequency and nocturia; has been going to the bathroom 3-5 times a night.  He follows up with oncology every 3 months.  His next oncology visit is on 6/4.   He notes his cough has improved with tessalon  capsules and gabapentin .   Review of Systems See other pertinent positives and negatives in HPI.  Current Outpatient Medications on File Prior to Visit  Medication Sig Dispense Refill   acetaminophen  (TYLENOL ) 500 MG tablet Take 2 tablets (1,000 mg total) by mouth 3 (three) times daily. 30 tablet 0   albuterol  (VENTOLIN  HFA) 108 (90 Base) MCG/ACT inhaler TAKE 2 PUFFS BY MOUTH EVERY 6 HOURS AS NEEDED FOR WHEEZE OR SHORTNESS OF BREATH 8.5 each 12   aspirin  EC 81 MG tablet Take 81 mg by mouth daily.     atorvastatin  (LIPITOR) 20 MG tablet TAKE 1 TABLET BY MOUTH ONCE  DAILY 100 tablet 2   benzonatate  (TESSALON ) 100 MG capsule TAKE 1 CAPSULE BY MOUTH EVERY DAY AS NEEDED 30 capsule 1   budesonide -formoterol  (SYMBICORT ) 160-4.5 MCG/ACT inhaler  1 puff then rinse mouth, twice daily 10.2 g 6   citalopram  (CELEXA ) 10 MG tablet Take 2 tablets (20 mg total) by mouth daily.     diclofenac  Sodium (VOLTAREN ) 1 % GEL AAPLY 4 GRAMS TOPICALLY 4 TIMES DAILY (Patient taking differently: Apply 1 Application topically 4 (four) times daily as needed (pain).) 400 g 2   enzalutamide  (XTANDI ) 40 MG tablet Take 4 tablets (160 mg total) by mouth daily. 120 tablet 2   fluticasone  (FLONASE ) 50 MCG/ACT nasal spray PLACE 1 SPRAY INTO BOTH NOSTRILS DAILY AS NEEDED FOR ALLERGIES OR RHINITIS (AT BEDTIME). 48 mL 1   furosemide  (LASIX ) 20 MG tablet TAKE 1 TABLET BY MOUTH DAILY 100 tablet 2   gabapentin  (NEURONTIN ) 300 MG capsule Take 1 capsule (300 mg total) by mouth at bedtime. 30 capsule 3   Melatonin 10 MG CAPS Take 20 mg by mouth at bedtime.     metoprolol  tartrate (LOPRESSOR ) 25 MG tablet TAKE 1 TABLET BY MOUTH TWICE  DAILY 200 tablet 2   Multiple Vitamin (MULTIVITAMIN WITH MINERALS) TABS tablet Take 0.5 tablets by mouth daily.     omeprazole  (PRILOSEC) 20 MG capsule TAKE 1 CAPSULE BY MOUTH DAILY  BEFORE BREAKFAST 100 capsule 2   Spacer/Aero-Holding Chambers DEVI To use with inhalers 1 Device 0   No current facility-administered medications on file prior  to visit.    Past Medical History:  Diagnosis Date   Anxiety    Arthritis    COPD (chronic obstructive pulmonary disease) (HCC)    Coronary artery disease    Dyspnea    Gastric outlet obstruction 02/03/2018   Maximo Spar 02/03/2018   GERD (gastroesophageal reflux disease)    History of hiatal hernia    small/notes 02/03/2018   Hypertension    no meds   Presence of permanent cardiac pacemaker    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   Gleason 4+5=9, PSA 15.65   Radiation    Sinus problem    SSS (sick sinus syndrome) (HCC)    Allergies  Allergen Reactions   Penicillins Rash    Has patient had a PCN reaction causing immediate rash, facial/tongue/throat swelling, SOB or lightheadedness with hypotension:  Yes Has patient had a PCN reaction causing severe rash involving mucus membranes or skin necrosis: No Has patient had a PCN reaction that required hospitalization: No Has patient had a PCN reaction occurring within the last 10 years: No If all of the above answers are "NO", then may proceed with Cephalosporin use.    Social History   Socioeconomic History   Marital status: Married    Spouse name: Not on file   Number of children: 3   Years of education: Not on file   Highest education level: Not on file  Occupational History   Occupation: Estate agent    Employer: [employer]    Comment: retired   Occupation: Production designer, theatre/television/film    Comment: [place]  Tobacco Use   Smoking status: Former    Types: Cigars    Quit date: 09/14/2012    Years since quitting: 11.3   Smokeless tobacco: Never   Tobacco comments:    little cigars; the small ones"  Vaping Use   Vaping status: Never Used  Substance and Sexual Activity   Alcohol  use: Not Currently    Alcohol /week: 14.0 standard drinks of alcohol     Types: 14 Cans of beer per week    Comment: quit in 2000   Drug use: No   Sexual activity: Not Currently  Other Topics Concern   Not on file  Social History Narrative   Lives with wife and 2 dogs in one level home; daughter and her family co-habitate.   Has three daughters, all in [place], supportive. Five grandchildren.   Wants to return to doing silver sneakers once gym re-opens.         Social Drivers of Corporate investment banker Strain: Low Risk  (07/29/2022)   Overall Financial Resource Strain (CARDIA)    Difficulty of Paying Living Expenses: Not hard at all  Recent Concern: Financial Resource Strain - Medium Risk (05/27/2022)   Overall Financial Resource Strain (CARDIA)    Difficulty of Paying Living Expenses: Somewhat hard  Food Insecurity: No Food Insecurity (10/15/2022)   Hunger Vital Sign    Worried About Running Out of Food in the Last Year: Never true    Ran Out of Food in the  Last Year: Never true  Transportation Needs: No Transportation Needs (10/15/2022)   PRAPARE - Administrator, Civil Service (Medical): No    Lack of Transportation (Non-Medical): No  Physical Activity: Insufficiently Active (07/29/2022)   Exercise Vital Sign    Days of Exercise per Week: 5 days    Minutes of Exercise per Session: 20 min  Stress: No Stress Concern Present (07/29/2022)   [employer] of Occupational  Health - Occupational Stress Questionnaire    Feeling of Stress : Not at all  Social Connections: Socially Integrated (07/29/2022)   Social Connection and Isolation Panel [NHANES]    Frequency of Communication with Friends and Family: More than three times a week    Frequency of Social Gatherings with Friends and Family: More than three times a week    Attends Religious Services: More than 4 times per year    Active Member of [employer] or Organizations: Yes    Attends Banker Meetings: More than 4 times per year    Marital Status: Married    Vitals:   01/21/24 0755  BP: 126/80  Pulse: 88  SpO2: 92%   Body mass index is 32.31 kg/m.  Physical Exam Nursing note reviewed.  Constitutional:      General: He is not in acute distress.    Appearance: He is well-developed.  HENT:     Head: Normocephalic and atraumatic.     Mouth/Throat:     Comments: Hematoma noted on his inner right cheek, possibly caused by his new dentures.  Eyes:     Conjunctiva/sclera: Conjunctivae normal.  Cardiovascular:     Rate and Rhythm: Normal rate and regular rhythm.     Pulses:          Dorsalis pedis pulses are 2+ on the right side and 2+ on the left side.     Heart sounds: No murmur heard. Pulmonary:     Effort: Pulmonary effort is normal. No respiratory distress.     Breath sounds: Normal breath sounds.  Abdominal:     Palpations: Abdomen is soft. There is no hepatomegaly or mass.     Tenderness: There is no abdominal tenderness.  Musculoskeletal:     Right  shoulder: No tenderness. Decreased range of motion.  Lymphadenopathy:     Cervical: No cervical adenopathy.  Skin:    General: Skin is warm.     Findings: No erythema or rash.  Neurological:     Mental Status: He is alert and oriented to person, place, and time.     Comments: Unstable gait, assisted with cane.   Psychiatric:     Comments: Well groomed, good eye contact.    ASSESSMENT AND PLAN: Tony [name] was seen today for a follow up visit.   No orders of the defined types were placed in this encounter.   Essential hypertension, benign BP adequately controlled. Continue metoprolol  titrate 25 mg 1/2 tablet twice daily on low salt diet. Continue monitoring BP regularly. Eye exam is current.  Anxiety disorder Problem is well-controlled. Continue Celexa  10 mg daily.  Fall precaution discussed. He has an appointment today with orthopedics to reevaluate her right shoulder.  Recommend discussing these concerns with urologist. Furosemide  could be aggravating problem. *** He would like to continue furosemide  20 mg daily. Recommend compression stockings and lower extremity elevation. *** Return in about 6 months (around 07/23/2024) for chronic problems.   I, Bernita Bristle, acting as a scribe for Mariana Wiederholt [place], MD., have documented all relevant documentation on the behalf of Lynel Forester [place], MD, as directed by   while in the presence of Cassie Henkels [place], MD.  I, Wylene Weissman [place], MD, have reviewed all documentation for this visit. The documentation on 01/21/24 for the exam, diagnosis, procedures, and orders are all accurate and complete.  Jacqueline Delapena G. [place], MD  [facility] [place]. [place] office.

## 2024-01-21 ENCOUNTER — Encounter: Payer: Self-pay | Admitting: Family Medicine

## 2024-01-21 ENCOUNTER — Ambulatory Visit: Admitting: Family Medicine

## 2024-01-21 ENCOUNTER — Ambulatory Visit (INDEPENDENT_AMBULATORY_CARE_PROVIDER_SITE_OTHER): Admitting: Family Medicine

## 2024-01-21 VITALS — BP 126/80 | HR 88 | Resp 16 | Ht 68.0 in | Wt 212.5 lb

## 2024-01-21 VITALS — BP 104/64 | HR 50 | Ht 68.0 in | Wt 211.0 lb

## 2024-01-21 DIAGNOSIS — F419 Anxiety disorder, unspecified: Secondary | ICD-10-CM | POA: Diagnosis not present

## 2024-01-21 DIAGNOSIS — S42017D Nondisplaced fracture of sternal end of right clavicle, subsequent encounter for fracture with routine healing: Secondary | ICD-10-CM | POA: Diagnosis not present

## 2024-01-21 DIAGNOSIS — R600 Localized edema: Secondary | ICD-10-CM | POA: Diagnosis not present

## 2024-01-21 DIAGNOSIS — I10 Essential (primary) hypertension: Secondary | ICD-10-CM | POA: Diagnosis not present

## 2024-01-21 DIAGNOSIS — R053 Chronic cough: Secondary | ICD-10-CM | POA: Diagnosis not present

## 2024-01-21 DIAGNOSIS — R350 Frequency of micturition: Secondary | ICD-10-CM | POA: Diagnosis not present

## 2024-01-21 DIAGNOSIS — W19XXXD Unspecified fall, subsequent encounter: Secondary | ICD-10-CM

## 2024-01-21 NOTE — Patient Instructions (Signed)
 Thank you for coming in today.   Recheck with me as needed.   Advance activity as tolerated.

## 2024-01-21 NOTE — Patient Instructions (Addendum)
 A few things to remember from today's visit:  Fall, subsequent encounter  Essential hypertension, benign  Anxiety disorder, unspecified type  Urinary frequency  Bilateral lower extremity edema  No changes today. Discuss urinary frequency with your urologist. Furosemide  can also aggravate problem. Compression stockings and elevation of legs to help with swelling.  If you need refills for medications you take chronically, please call your pharmacy. Do not use My Chart to request refills or for acute issues that need immediate attention. If you send a my chart message, it may take a few days to be addressed, specially if I am not in the office.  Please be sure medication list is accurate. If a new problem present, please set up appointment sooner than planned today.

## 2024-01-21 NOTE — Assessment & Plan Note (Signed)
BP adequately controlled. Continue metoprolol titrate 25 mg 1/2 tablet twice daily on low salt diet. Continue monitoring BP regularly. Eye exam is current.

## 2024-01-21 NOTE — Progress Notes (Signed)
   I, Miquel Amen, CMA acting as a scribe for Garlan Juniper, MD.  Tony [name] is a 83 y.o. male who presents to [facility] at [facility] today for f/u R proximal clavicle fx occurring on 2/17. Pt was last seen by Dr. Alease Hunter on 12/16/23 and was advised to wean out of the sling.  Today, pt reports significant improvement of sx since last visit. The arm/shoulder is moving well. Denies fall or new injury since last visit. Has been out of the sling for > 1 month. Avoids lifting anything heavy.   Dx imaging: 12/16/23 R clavicle XR 11/01/23 R clavicle & R shoulder XR and cervical CT   Pertinent review of systems: No fevers or chills  Relevant historical information: Heart disease.   Exam:  BP 104/64   Pulse (!) 50   Ht 5\' 8"  (1.727 m)   Wt 211 lb (95.7 kg)   SpO2 91%   BMI 32.08 kg/m  General: Well Developed, well nourished, and in no acute distress.   MSK: Right shoulder normal.  Normal motion i      Assessment and Plan: 83 y.o. male with right shoulder pain and injury.  Patient had a clavicle fracture seen on CT scan.  Follow-up x-ray last month was unable to visualize the fracture.  Clinically he is doing great out of the sling.  Plan to advance activity as tolerated and check back with me as needed.   PDMP not reviewed this encounter. No orders of the defined types were placed in this encounter.  No orders of the defined types were placed in this encounter.    Discussed warning signs or symptoms. Please see discharge instructions. Patient expresses understanding.   The above documentation has been reviewed and is accurate and complete Garlan Juniper, M.D.

## 2024-01-21 NOTE — Assessment & Plan Note (Signed)
 Problem is well-controlled. Continue Celexa  10 mg daily.

## 2024-01-22 NOTE — Assessment & Plan Note (Signed)
 We discussed some side effects of Furosemide , he would like to continue it. LE elevation and compression stockings recommended.

## 2024-01-22 NOTE — Assessment & Plan Note (Signed)
 Stable. Continue Benzonatate  100 mg daily at bedtime as needed and Gabapentin  300 mg daily. He follows with pulmonologist.

## 2024-01-24 ENCOUNTER — Encounter: Payer: Medicare Other | Admitting: Family Medicine

## 2024-01-24 ENCOUNTER — Other Ambulatory Visit: Payer: Self-pay

## 2024-01-26 NOTE — Addendum Note (Signed)
 Addended by: Lott Rouleau A on: 01/26/2024 02:22 PM   Modules accepted: Orders

## 2024-01-26 NOTE — Progress Notes (Signed)
 Remote pacemaker transmission.

## 2024-01-27 ENCOUNTER — Other Ambulatory Visit: Payer: Self-pay

## 2024-01-27 ENCOUNTER — Other Ambulatory Visit: Payer: Self-pay | Admitting: Pharmacy Technician

## 2024-01-27 NOTE — Progress Notes (Signed)
 Specialty Pharmacy Refill Coordination Note  Tony [name] is a 83 y.o. male contacted today regarding refills of specialty medication(s) Enzalutamide  (XTANDI )   Patient requested Delivery   Delivery date: 01/31/24   Verified address: [address] [place], [place] [address]   Medication will be filled on 01/28/24.  Spoke to patient's spouse.

## 2024-01-31 ENCOUNTER — Other Ambulatory Visit: Payer: Self-pay | Admitting: Cardiovascular Disease

## 2024-01-31 DIAGNOSIS — I10 Essential (primary) hypertension: Secondary | ICD-10-CM

## 2024-02-15 ENCOUNTER — Other Ambulatory Visit: Payer: Self-pay

## 2024-02-15 DIAGNOSIS — C61 Malignant neoplasm of prostate: Secondary | ICD-10-CM

## 2024-02-15 NOTE — Progress Notes (Signed)
 Patient Care Team: [place], [name], MD as PCP - General (Family Medicine) Croitoru, Karyl Paget, MD as PCP - Cardiology (Cardiology) Alver Austin, [facility] (Inactive) as Pharmacist (Pharmacist) Sonja [place], MD as Attending Physician (Hematology and Oncology)  Clinic Day:  02/16/2024  Referring physician: [place], [name], MD  ASSESSMENT & PLAN:   Assessment & Plan: Malignant neoplasm of prostate [facility]) -initially T3aN0M0 diagnosed in 2015, with Gleason score 4+5 = 9 and a PSA 15.7  -Status post radiation therapy with external beam and seed implant brachytherapy boost completed in October 2015 -He received 1 year androgen deprivation in 2016. -Due to biochemical relapse in 2017, he re-started ADT -Zytiga  with prednisone  started in August 2017, discontinued in October 2023 because of insurance coverage. -He was switched to Xtandi  160 mg daily in October 2023 -He has responded very well so far, PSA undetectable. -Last CT scan in May and December 2023 showed no evidence of metastasis. -had CTA abdomen an pelvis which shows slightly increased size of infrarenal AAA. He is followed by vascular surgery.  -CT abdomen and pelvis 10/27/2023 showed radiation sees in the prostate but no mentioned evidence of recurrence or metastatic disease.  Continue with Eligard  every 3 months and xtandi  160 mg daily at home.    Plan Labs reviewed. -CBC and CMP are unremarkable. - PSA <0.1. Proceed with Eligard  injection 22.5 mg today.  Continue Eligard  injections every 3 months. Continue Xtandi  160 mg daily.  Will refill as indicated. Labs, follow-up, and next Eligard  injection in 3 months.  He should follow-up sooner if needed. The patient understands the plans discussed today and is in agreement with them.  He knows to contact our office if he develops concerns prior to his next appointment.  I provided 25 minutes of face-to-face time during this encounter and > 50% was spent counseling as documented under my  assessment and plan.    Sharyon Deis, NP  [facility] - A DEPT OF Tom[facility] Fragmin. [facility] [address] [place] [address] Dept: [phone] Dept Fax: [phone]   No orders of the defined types were placed in this encounter.     CHIEF COMPLAINT:  CC: Malignant neoplasm of the prostate  Current Treatment: Eligard  22.5 mg every 3 months and Xtandi  160 mg daily  INTERVAL HISTORY:  Kyran is here today for repeat clinical assessment.  He was last seen by me on 11/15/2023.  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  Gabapentin  relieving symptoms.  He reports no new symptoms or concerns.  Denies frequency or urgency or other urinary symptoms. He denies chest pain, chest pressure, or shortness of breath. He denies headaches or visual disturbances. He denies abdominal pain, nausea, vomiting, or changes in bowel or bladder habits.   He denies fevers or chills. He denies pain. His appetite is good. His weight has been stable.  I have reviewed the past medical history, past surgical history, social history and family history with the patient and they are unchanged from previous note.  ALLERGIES:  is allergic to penicillins.  MEDICATIONS:  Current Outpatient Medications  Medication Sig Dispense Refill   acetaminophen  (TYLENOL ) 500 MG tablet Take 2 tablets (1,000 mg total) by mouth 3 (three) times daily. 30 tablet 0   albuterol  (VENTOLIN  HFA) 108 (90 Base) MCG/ACT inhaler TAKE 2 PUFFS BY MOUTH EVERY 6 HOURS AS NEEDED FOR WHEEZE OR SHORTNESS OF BREATH 8.5 each 12   aspirin  EC  81 MG tablet Take 81 mg by mouth daily.     atorvastatin  (LIPITOR) 20 MG tablet TAKE 1 TABLET BY MOUTH ONCE  DAILY 100 tablet 2   benzonatate  (TESSALON ) 100 MG capsule TAKE 1 CAPSULE BY MOUTH EVERY DAY AS NEEDED 30 capsule 1   budesonide -formoterol  (SYMBICORT ) 160-4.5 MCG/ACT inhaler 1 puff then rinse mouth, twice daily 10.2 g 6   citalopram  (CELEXA )  10 MG tablet Take 2 tablets (20 mg total) by mouth daily.     diclofenac  Sodium (VOLTAREN ) 1 % GEL AAPLY 4 GRAMS TOPICALLY 4 TIMES DAILY (Patient taking differently: Apply 1 Application topically 4 (four) times daily as needed (pain).) 400 g 2   enzalutamide  (XTANDI ) 40 MG tablet Take 4 tablets (160 mg total) by mouth daily. 120 tablet 2   fluticasone  (FLONASE ) 50 MCG/ACT nasal spray PLACE 1 SPRAY INTO BOTH NOSTRILS DAILY AS NEEDED FOR ALLERGIES OR RHINITIS (AT BEDTIME). 48 mL 1   furosemide  (LASIX ) 20 MG tablet TAKE 1 TABLET BY MOUTH DAILY 100 tablet 2   gabapentin  (NEURONTIN ) 300 MG capsule Take 1 capsule (300 mg total) by mouth at bedtime. 30 capsule 3   Melatonin 10 MG CAPS Take 20 mg by mouth at bedtime.     metoprolol  tartrate (LOPRESSOR ) 25 MG tablet TAKE 1 TABLET BY MOUTH TWICE A DAY 180 tablet 3   Multiple Vitamin (MULTIVITAMIN WITH MINERALS) TABS tablet Take 0.5 tablets by mouth daily.     omeprazole  (PRILOSEC) 20 MG capsule TAKE 1 CAPSULE BY MOUTH DAILY  BEFORE BREAKFAST 100 capsule 2   Spacer/Aero-Holding Idelle Majors DEVI To use with inhalers 1 Device 0   No current facility-administered medications for this visit.    HISTORY OF PRESENT ILLNESS:   Oncology History  Malignant neoplasm of prostate (HCC)  02/23/2014 Initial Diagnosis   Malignant neoplasm of prostate       REVIEW OF SYSTEMS:   Constitutional: Denies fevers, chills or abnormal weight loss Eyes: Denies blurriness of vision Ears, nose, mouth, throat, and face: Denies mucositis or sore throat Respiratory: Denies cough, dyspnea or wheezes Cardiovascular: Denies palpitation, chest discomfort or lower extremity swelling Gastrointestinal:  Denies nausea, heartburn or change in bowel habits Skin: Denies abnormal skin rashes Lymphatics: Denies new lymphadenopathy or easy bruising Neurological:Denies numbness, tingling or new weaknesses Behavioral/Psych: Mood is stable, no new changes  All other systems were reviewed  with the patient and are negative.   VITALS:   Today's Vitals   02/16/24 0842 02/16/24 0951  BP: 138/78   Pulse: 84   Resp: 20   Temp: 97.6 F (36.4 C)   SpO2: 95%   Weight: 213 lb (96.6 kg)   PainSc:  0-No pain   Body mass index is 32.39 kg/m.   Wt Readings from Last 3 Encounters:  02/16/24 213 lb (96.6 kg)  01/21/24 211 lb (95.7 kg)  01/21/24 212 lb 8 oz (96.4 kg)    Body mass index is 32.39 kg/m.  Performance status (ECOG): 1 - Symptomatic but completely ambulatory  PHYSICAL EXAM:   GENERAL:alert, no distress and comfortable SKIN: skin color, texture, turgor are normal, no rashes or significant lesions EYES: normal, Conjunctiva are pink and non-injected, sclera clear OROPHARYNX:no exudate, no erythema and lips, buccal mucosa, and tongue normal  NECK: supple, thyroid  normal size, non-tender, without nodularity LYMPH:  no palpable lymphadenopathy in the cervical, axillary or inguinal LUNGS: clear to auscultation and percussion with normal breathing effort HEART: regular rate & rhythm and no murmurs and no lower extremity  edema ABDOMEN:abdomen soft, non-tender and normal bowel sounds Musculoskeletal:no cyanosis of digits and no clubbing  NEURO: alert & oriented x 3 with fluent speech, no focal motor/sensory deficits  LABORATORY DATA:  I have reviewed the data as listed    Component Value Date/Time   NA 137 02/16/2024 0813   NA 140 09/09/2023 1026   NA 141 08/27/2017 0915   K 4.3 02/16/2024 0813   K 4.8 08/27/2017 0915   CL 104 02/16/2024 0813   CO2 24 02/16/2024 0813   CO2 26 08/27/2017 0915   GLUCOSE 119 (H) 02/16/2024 0813   GLUCOSE 108 08/27/2017 0915   BUN 18 02/16/2024 0813   BUN 17 09/09/2023 1026   BUN 16.2 08/27/2017 0915   CREATININE 1.00 02/16/2024 0813   CREATININE 1.07 07/31/2020 1325   CREATININE 0.9 08/27/2017 0915   CALCIUM  9.3 02/16/2024 0813   CALCIUM  9.8 08/27/2017 0915   PROT 7.2 02/16/2024 0813   PROT 7.3 08/27/2017 0915   ALBUMIN  4.4 02/16/2024 0813   ALBUMIN 4.1 08/27/2017 0915   AST 19 02/16/2024 0813   AST 23 08/27/2017 0915   ALT 12 02/16/2024 0813   ALT 17 08/27/2017 0915   ALKPHOS 97 02/16/2024 0813   ALKPHOS 83 08/27/2017 0915   BILITOT 0.9 02/16/2024 0813   BILITOT 0.95 08/27/2017 0915   GFRNONAA >60 02/16/2024 0813   GFRNONAA 66 07/31/2020 1325   GFRAA 77 07/31/2020 1325     Lab Results  Component Value Date   WBC 5.0 02/16/2024   NEUTROABS 3.1 02/16/2024   HGB 13.1 02/16/2024   HCT 39.5 02/16/2024   MCV 84.4 02/16/2024   PLT 226 02/16/2024

## 2024-02-15 NOTE — Assessment & Plan Note (Addendum)
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 -Due to biochemical relapse in 2017, he re-started ADT -Zytiga  with prednisone  started in August 2017, discontinued in October 2023 because of insurance coverage. -He was switched to Xtandi  160 mg daily in October 2023 -He has responded very well so far, PSA undetectable. -Last CT scan in May and December 2023 showed no evidence of metastasis. -had CTA abdomen an pelvis which shows slightly increased size of infrarenal AAA. He is followed by vascular surgery.  -CT abdomen and pelvis 10/27/2023 showed radiation sees in the prostate but no mentioned evidence of recurrence or metastatic disease.  Continue with Eligard  every 3 months and xtandi  160 mg daily at home.  - Labs and follow-up and next Eligard  will 3 months.  Follow-up sooner if needed.

## 2024-02-16 ENCOUNTER — Ambulatory Visit

## 2024-02-16 ENCOUNTER — Other Ambulatory Visit

## 2024-02-16 ENCOUNTER — Other Ambulatory Visit: Payer: Self-pay

## 2024-02-16 ENCOUNTER — Inpatient Hospital Stay: Attending: Hematology

## 2024-02-16 ENCOUNTER — Ambulatory Visit: Admitting: Nurse Practitioner

## 2024-02-16 ENCOUNTER — Inpatient Hospital Stay: Admitting: Nurse Practitioner

## 2024-02-16 ENCOUNTER — Inpatient Hospital Stay

## 2024-02-16 VITALS — BP 138/78 | HR 84 | Temp 97.6°F | Resp 20 | Wt 213.0 lb

## 2024-02-16 DIAGNOSIS — E669 Obesity, unspecified: Secondary | ICD-10-CM

## 2024-02-16 DIAGNOSIS — C61 Malignant neoplasm of prostate: Secondary | ICD-10-CM | POA: Insufficient documentation

## 2024-02-16 DIAGNOSIS — Z5111 Encounter for antineoplastic chemotherapy: Secondary | ICD-10-CM | POA: Insufficient documentation

## 2024-02-16 LAB — CBC WITH DIFFERENTIAL (CANCER CENTER ONLY)
Abs Immature Granulocytes: 0.01 10*3/uL (ref 0.00–0.07)
Basophils Absolute: 0 10*3/uL (ref 0.0–0.1)
Basophils Relative: 1 %
Eosinophils Absolute: 0 10*3/uL (ref 0.0–0.5)
Eosinophils Relative: 1 %
HCT: 39.5 % (ref 39.0–52.0)
Hemoglobin: 13.1 g/dL (ref 13.0–17.0)
Immature Granulocytes: 0 %
Lymphocytes Relative: 23 %
Lymphs Abs: 1.1 10*3/uL (ref 0.7–4.0)
MCH: 28 pg (ref 26.0–34.0)
MCHC: 33.2 g/dL (ref 30.0–36.0)
MCV: 84.4 fL (ref 80.0–100.0)
Monocytes Absolute: 0.6 10*3/uL (ref 0.1–1.0)
Monocytes Relative: 13 %
Neutro Abs: 3.1 10*3/uL (ref 1.7–7.7)
Neutrophils Relative %: 62 %
Platelet Count: 226 10*3/uL (ref 150–400)
RBC: 4.68 MIL/uL (ref 4.22–5.81)
RDW: 14.9 % (ref 11.5–15.5)
WBC Count: 5 10*3/uL (ref 4.0–10.5)
nRBC: 0 % (ref 0.0–0.2)

## 2024-02-16 LAB — CMP (CANCER CENTER ONLY)
ALT: 12 U/L (ref 0–44)
AST: 19 U/L (ref 15–41)
Albumin: 4.4 g/dL (ref 3.5–5.0)
Alkaline Phosphatase: 97 U/L (ref 38–126)
Anion gap: 9 (ref 5–15)
BUN: 18 mg/dL (ref 8–23)
CO2: 24 mmol/L (ref 22–32)
Calcium: 9.3 mg/dL (ref 8.9–10.3)
Chloride: 104 mmol/L (ref 98–111)
Creatinine: 1 mg/dL (ref 0.61–1.24)
GFR, Estimated: >60 mL/min (ref >60)
Glucose, Bld: 119 mg/dL — ABNORMAL HIGH (ref 70–99)
Potassium: 4.3 mmol/L (ref 3.5–5.1)
Sodium: 137 mmol/L (ref 135–145)
Total Bilirubin: 0.9 mg/dL (ref 0.0–1.2)
Total Protein: 7.2 g/dL (ref 6.5–8.1)

## 2024-02-16 MED ORDER — ENZALUTAMIDE 40 MG PO TABS
160.0000 mg | ORAL_TABLET | Freq: Every day | ORAL | 2 refills | Status: DC
  Filled 2024-02-16 – 2024-02-28 (×2): qty 120, 30d supply, fill #0
  Filled 2024-03-30: qty 120, 30d supply, fill #1
  Filled 2024-04-28 – 2024-05-01 (×2): qty 120, 30d supply, fill #2

## 2024-02-16 MED ORDER — LEUPROLIDE ACETATE (3 MONTH) 22.5 MG ~~LOC~~ KIT
22.5000 mg | PACK | Freq: Once | SUBCUTANEOUS | Status: AC
  Administered 2024-02-16: 22.5 mg via SUBCUTANEOUS
  Filled 2024-02-16: qty 22.5

## 2024-02-17 LAB — PROSTATE-SPECIFIC AG, SERUM (LABCORP): Prostate Specific Ag, Serum: <0.1 ng/mL (ref 0.0–4.0)

## 2024-02-22 ENCOUNTER — Other Ambulatory Visit: Payer: Self-pay

## 2024-02-25 ENCOUNTER — Other Ambulatory Visit: Payer: Self-pay

## 2024-02-27 ENCOUNTER — Encounter: Payer: Self-pay | Admitting: Hematology

## 2024-02-27 ENCOUNTER — Encounter: Payer: Self-pay | Admitting: Nurse Practitioner

## 2024-02-28 ENCOUNTER — Other Ambulatory Visit: Payer: Self-pay

## 2024-02-28 ENCOUNTER — Other Ambulatory Visit: Payer: Self-pay | Admitting: Pharmacy Technician

## 2024-02-28 NOTE — Progress Notes (Signed)
 Specialty Pharmacy Refill Coordination Note  Tony [name] is a 83 y.o. male contacted today regarding refills of specialty medication(s) Enzalutamide  (XTANDI )   Patient requested Delivery   Delivery date: 03/07/24   Verified address: [address]   [place] [address]   Medication will be filled on 03/06/24.   Spoke to patient's spouse.

## 2024-03-06 ENCOUNTER — Other Ambulatory Visit: Payer: Self-pay

## 2024-03-07 ENCOUNTER — Ambulatory Visit (INDEPENDENT_AMBULATORY_CARE_PROVIDER_SITE_OTHER): Admitting: Family Medicine

## 2024-03-07 ENCOUNTER — Encounter: Payer: Self-pay | Admitting: Family Medicine

## 2024-03-07 VITALS — BP 120/70 | HR 82 | Temp 97.7°F | Resp 16 | Ht 68.0 in | Wt 213.2 lb

## 2024-03-07 DIAGNOSIS — R350 Frequency of micturition: Secondary | ICD-10-CM | POA: Diagnosis not present

## 2024-03-07 DIAGNOSIS — M545 Low back pain, unspecified: Secondary | ICD-10-CM | POA: Diagnosis not present

## 2024-03-07 DIAGNOSIS — R21 Rash and other nonspecific skin eruption: Secondary | ICD-10-CM | POA: Diagnosis not present

## 2024-03-07 LAB — POC URINALSYSI DIPSTICK (AUTOMATED)
Bilirubin, UA: NEGATIVE
Blood, UA: NEGATIVE
Glucose, UA: NEGATIVE
Ketones, UA: NEGATIVE
Leukocytes, UA: NEGATIVE
Nitrite, UA: NEGATIVE
Protein, UA: NEGATIVE
Urobilinogen, UA: 0.2 U/dL
pH, UA: 7 (ref 5.0–8.0)

## 2024-03-07 MED ORDER — TIZANIDINE HCL 4 MG PO TABS: 2.0000 mg | ORAL_TABLET | Freq: Every day | ORAL | 0 refills | Status: DC

## 2024-03-07 MED ORDER — TRIAMCINOLONE ACETONIDE 0.1 % EX CREA: 1.0000 | TOPICAL_CREAM | Freq: Every day | CUTANEOUS | 0 refills | Status: DC | PRN

## 2024-03-07 NOTE — Progress Notes (Signed)
 ACUTE VISIT Chief Complaint  Patient presents with   Flank Pain    X 3-4 days   HPI: Tony [name] is a 83 y.o. male with PMHx significant for COPD, chronic cough, HTN, AAA, SSS s/p pacemaker placement, anxiety,and prostate cancer here today complaining of urinary frequency and back pain, ongoing for 3-4 days.  He also endorses nocturia and is urinating 3-4 times per night.  Today, he suspects that pain may be caused by a UTI.   Flank Pain This is a new problem. The current episode started in the past 7 days. The problem occurs intermittently. The problem is unchanged. The pain is present in the lumbar spine. The quality of the pain is described as aching. The pain does not radiate. The pain is at a severity of 8/10. The pain is moderate. The symptoms are aggravated by standing. Pertinent negatives include no abdominal pain, bladder incontinence, bowel incontinence, chest pain, dysuria, fever, headaches, leg pain, numbness, paresthesias, pelvic pain, perianal numbness, tingling, weakness or weight loss. Risk factors include history of cancer. He has tried nothing for the symptoms.   His urine was a darker yellow color but as he has increased his daily water  intake, it has cleared up.   He complains of bilateral lower back pain starting about 2 weeks ago after heavy lifting and worsening in the last 2-3 days.  The pain is more intense when first standing up, specially in the morning, but improves/subsides as he continues to walk.  + Stiffness. No hx of trauma. Overall his pain is localized and not radiating downwards.  Denies any fever, chills, dysuria, hematuria, abdominal pain, or nausea. 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 -Lower back pruritus, intermittent for a while. OTC Cortizone cream helps. He has not identified exacerbating or alleviating factors.  Review of Systems  Constitutional:   Negative for activity change, appetite change, fever and weight loss.  HENT:  Negative for sore throat.   Respiratory:  Positive for cough (No more than usual). Negative for wheezing.   Cardiovascular:  Negative for chest pain.  Gastrointestinal:  Negative for abdominal pain, bowel incontinence, nausea and vomiting.  Genitourinary:  Positive for flank pain. Negative for bladder incontinence, dysuria and pelvic pain.  Musculoskeletal:  Positive for arthralgias, back pain and gait problem.  Neurological:  Negative for tingling, weakness, numbness, headaches and paresthesias.  See other pertinent positives and negatives in HPI.  Current Outpatient Medications on File Prior to Visit  Medication Sig Dispense Refill   acetaminophen  (TYLENOL ) 500 MG tablet Take 2 tablets (1,000 mg total) by mouth 3 (three) times daily. 30 tablet 0   albuterol  (VENTOLIN  HFA) 108 (90 Base) MCG/ACT inhaler TAKE 2 PUFFS BY MOUTH EVERY 6 HOURS AS NEEDED FOR WHEEZE OR SHORTNESS OF BREATH 8.5 each 12   aspirin  EC 81 MG tablet Take 81 mg by mouth daily.     atorvastatin  (LIPITOR) 20 MG tablet TAKE 1 TABLET BY MOUTH ONCE  DAILY 100 tablet 2   benzonatate  (TESSALON ) 100 MG capsule TAKE 1 CAPSULE BY MOUTH EVERY DAY AS NEEDED 30 capsule 1   budesonide -formoterol  (SYMBICORT ) 160-4.5 MCG/ACT inhaler 1 puff then rinse mouth, twice daily 10.2 g 6   citalopram  (CELEXA ) 10 MG tablet Take 2 tablets (20 mg total) by mouth daily.     diclofenac  Sodium (VOLTAREN ) 1 % GEL AAPLY 4 GRAMS TOPICALLY 4 TIMES DAILY (Patient taking differently: Apply 1 Application topically 4 (  four) times daily as needed (pain).) 400 g 2   enzalutamide  (XTANDI ) 40 MG tablet Take 4 tablets (160 mg total) by mouth daily. 120 tablet 2   fluticasone  (FLONASE ) 50 MCG/ACT nasal spray PLACE 1 SPRAY INTO BOTH NOSTRILS DAILY AS NEEDED FOR ALLERGIES OR RHINITIS (AT BEDTIME). 48 mL 1   furosemide  (LASIX ) 20 MG tablet TAKE 1 TABLET BY MOUTH DAILY 100 tablet 2   gabapentin   (NEURONTIN ) 300 MG capsule Take 1 capsule (300 mg total) by mouth at bedtime. 30 capsule 3   Melatonin 10 MG CAPS Take 20 mg by mouth at bedtime.     metoprolol  tartrate (LOPRESSOR ) 25 MG tablet TAKE 1 TABLET BY MOUTH TWICE A DAY 180 tablet 3   Multiple Vitamin (MULTIVITAMIN WITH MINERALS) TABS tablet Take 0.5 tablets by mouth daily.     omeprazole  (PRILOSEC) 20 MG capsule TAKE 1 CAPSULE BY MOUTH DAILY  BEFORE BREAKFAST 100 capsule 2   Spacer/Aero-Holding Chambers DEVI To use with inhalers 1 Device 0   No current facility-administered medications on file prior to visit.    Past Medical History:  Diagnosis Date   Anxiety    Arthritis    COPD (chronic obstructive pulmonary disease) (HCC)    Coronary artery disease    Dyspnea    Gastric outlet obstruction 02/03/2018   thelbert 02/03/2018   GERD (gastroesophageal reflux disease)    History of hiatal hernia    small/notes 02/03/2018   Hypertension    no meds   Presence of permanent cardiac pacemaker    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   Gleason 4+5=9, PSA 15.65   Radiation    Sinus problem    SSS (sick sinus syndrome) (HCC)    Allergies  Allergen Reactions   Penicillins Rash    Has patient had a PCN reaction causing immediate rash, facial/tongue/throat swelling, SOB or lightheadedness with hypotension: Yes Has patient had a PCN reaction causing severe rash involving mucus membranes or skin necrosis: No Has patient had a PCN reaction that required hospitalization: No Has patient had a PCN reaction occurring within the last 10 years: No If all of the above answers are NO, then may proceed with Cephalosporin use.    Social History   Socioeconomic History   Marital status: Married    Spouse name: Not on file   Number of children: 3   Years of education: Not on file   Highest education level: Not on file  Occupational History   Occupation: Estate agent    Employer: [employer]    Comment: retired   Occupation: Production designer, theatre/television/film     Comment: [employer]-[place]  Tobacco Use   Smoking status: Former    Types: Cigars    Quit date: 09/14/2012    Years since quitting: 11.4   Smokeless tobacco: Never   Tobacco comments:    little cigars; the small ones  Vaping Use   Vaping status: Never Used  Substance and Sexual Activity   Alcohol  use: Not Currently    Alcohol /week: 14.0 standard drinks of alcohol     Types: 14 Cans of beer per week    Comment: quit in 2000   Drug use: No   Sexual activity: Not Currently  Other Topics Concern   Not on file  Social History Narrative   Lives with wife and 2 dogs in one level home; daughter and her family co-habitate.   Has three daughters, all in [place], supportive. Five grandchildren.   Wants to return to doing silver sneakers once  gym re-opens.         Social Drivers of Corporate investment banker Strain: Low Risk  (07/29/2022)   Overall Financial Resource Strain (CARDIA)    Difficulty of Paying Living Expenses: Not hard at all  Recent Concern: Financial Resource Strain - Medium Risk (05/27/2022)   Overall Financial Resource Strain (CARDIA)    Difficulty of Paying Living Expenses: Somewhat hard  Food Insecurity: No Food Insecurity (10/15/2022)   Hunger Vital Sign    Worried About Running Out of Food in the Last Year: Never true    Ran Out of Food in the Last Year: Never true  Transportation Needs: No Transportation Needs (10/15/2022)   PRAPARE - Administrator, Civil Service (Medical): No    Lack of Transportation (Non-Medical): No  Physical Activity: Insufficiently Active (07/29/2022)   Exercise Vital Sign    Days of Exercise per Week: 5 days    Minutes of Exercise per Session: 20 min  Stress: No Stress Concern Present (07/29/2022)   [employer] of Occupational Health - Occupational Stress Questionnaire    Feeling of Stress : Not at all  Social Connections: Socially Integrated (07/29/2022)   Social Connection and Isolation Panel    Frequency of Communication  with Friends and Family: More than three times a week    Frequency of Social Gatherings with Friends and Family: More than three times a week    Attends Religious Services: More than 4 times per year    Active Member of [employer] or Organizations: Yes    Attends Banker Meetings: More than 4 times per year    Marital Status: Married   Vitals:   03/07/24 0759  BP: 120/70  Pulse: 82  Resp: 16  Temp: 97.7 F (36.5 C)  SpO2: 99%   Body mass index is 32.42 kg/m.  Physical Exam Vitals and nursing note reviewed.  Constitutional:      General: He is not in acute distress.    Appearance: He is well-developed.  HENT:     Head: Atraumatic.   Eyes:     Conjunctiva/sclera: Conjunctivae normal.    Cardiovascular:     Rate and Rhythm: Normal rate. Rhythm irregular.     Heart sounds: No murmur heard.    Comments: Trace pitting LE edema, bilateral. Pulmonary:     Effort: Pulmonary effort is normal. No respiratory distress.     Breath sounds: Normal breath sounds.  Abdominal:     Palpations: Abdomen is soft. There is no mass.     Tenderness: There is no abdominal tenderness. There is no right CVA tenderness or left CVA tenderness.   Musculoskeletal:     Lumbar back: Tenderness present. Negative right straight leg raise test and negative left straight leg raise test.       Back:     Comments: Mild pain elicited with movement on exam table during examination.   Skin:    General: Skin is warm.     Findings: Rash present. No erythema.       Neurological:     General: No focal deficit present.     Mental Status: He is alert and oriented to person, place, and time.     Comments: Antalgic gait assisted with a cane.  Psychiatric:        Mood and Affect: Mood and affect normal.    ASSESSMENT AND PLAN: Tony [name] was seen today for urinary frequency and back pain.  Acute bilateral  low back pain without sciatica We discussed differential diagnosis, history  is more suggestive of musculoskeletal pain. CT angio abdomen/pelvis CT done in 10/2023 revealed thoracolumbar degenerative changes. I do not think imaging is needed today. Recommend Tylenol  500 mg twice daily as needed, OTC IcyHot on affected area may also help. After discussion of some side effects, he agrees with trying Zanaflex 4 mg 1/2 to 1 tablet daily at bedtime for 10 to 14 days.  -     tiZANidine HCl; Take 0.5-1 tablets (2-4 mg total) by mouth at bedtime.  Dispense: 30 tablet; Refill: 0  Urinary frequency Most likely related with his history of prostate disease. Urine dipstick today here in the office negative today, will send urine for culture. Recommend avoiding fluids 3 to 4 hours before bedtime. Continue following with urologist.  -     POCT Urinalysis Dipstick (Automated) -     Urine Culture; Future  Rash and nonspecific skin eruption Chronic. History does not suggest a serious process. OTC Cortizone helps some, recommend trying triamcinolone  cream instead, daily as needed, small amount. Follow-up as needed.  -     Triamcinolone  Acetonide; Apply 1 Application topically daily as needed. On lower back rash  Dispense: 30 g; Refill: 0  Return if symptoms worsen or fail to improve, for keep next appointment.  I, Vernell Forest, acting as a scribe for Fiora Weill [place], MD., have documented all relevant documentation on the behalf of Alacia Rehmann [place], MD, as directed by   while in the presence of Marci Polito [place], MD.  I, Abraham Margulies [place], MD, have reviewed all documentation for this visit. The documentation on 03/07/24 for the exam, diagnosis, procedures, and orders are all accurate and complete.   Brett Darko G. [place], MD  [facility]. [place] office.

## 2024-03-07 NOTE — Patient Instructions (Addendum)
 A few things to remember from today's visit:  Acute bilateral low back pain without sciatica - Plan: tiZANidine (ZANAFLEX) 4 MG tablet  Urinary frequency Back pain sounds more like arthritis. You can try a muscle relaxant at bedtime, we have to be careful with falls because medication makes you drowsy. Zanaflex 1/2 to 1 tablet at bedtime for 10 to 14 days. Take Tylenol  500 mg twice daily as needed. You can apply over-the-counter IcyHot on lower back.  Urinary frequency could be part of your prostate issues. Urine test here in the office was negative, we are sending urine for culture. Continue adequate hydration. Follow-up with your urologist if problem is persisting.  If you need refills for medications you take chronically, please call your pharmacy. Do not use My Chart to request refills or for acute issues that need immediate attention. If you send a my chart message, it may take a few days to be addressed, specially if I am not in the office.  Please be sure medication list is accurate. If a new problem present, please set up appointment sooner than planned today.

## 2024-03-08 LAB — URINE CULTURE
MICRO NUMBER:: 16618124
Result:: NO GROWTH
SPECIMEN QUALITY:: ADEQUATE

## 2024-03-10 ENCOUNTER — Ambulatory Visit: Payer: Self-pay | Admitting: Internal Medicine
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
## 2024-03-14 ENCOUNTER — Ambulatory Visit: Payer: Medicare Other

## 2024-03-14 DIAGNOSIS — I495 Sick sinus syndrome: Secondary | ICD-10-CM | POA: Diagnosis not present

## 2024-03-14 LAB — CUP PACEART REMOTE DEVICE CHECK
Battery Remaining Longevity: 101 mo
Battery Voltage: 2.99 V
Brady Statistic AP VP Percent: 0.1 %
Brady Statistic AP VS Percent: 63.22 %
Brady Statistic AS VP Percent: 0.05 %
Brady Statistic AS VS Percent: 36.64 %
Brady Statistic RA Percent Paced: 63.11 %
Brady Statistic RV Percent Paced: 0.15 %
Date Time Interrogation Session: 20250630200412
Implantable Lead Connection Status: 753985
Implantable Lead Connection Status: 753985
Implantable Lead Implant Date: 20191001
Implantable Lead Implant Date: 20191001
Implantable Lead Location: 753859
Implantable Lead Location: 753860
Implantable Lead Model: 5076
Implantable Lead Model: 5076
Implantable Pulse Generator Implant Date: 20191001
Lead Channel Impedance Value: 304 Ohm
Lead Channel Impedance Value: 323 Ohm
Lead Channel Impedance Value: 399 Ohm
Lead Channel Impedance Value: 437 Ohm
Lead Channel Pacing Threshold Amplitude: 0.375 V
Lead Channel Pacing Threshold Amplitude: 0.75 V
Lead Channel Pacing Threshold Pulse Width: 0.4 ms
Lead Channel Pacing Threshold Pulse Width: 0.4 ms
Lead Channel Sensing Intrinsic Amplitude: 2.625 mV
Lead Channel Sensing Intrinsic Amplitude: 2.625 mV
Lead Channel Sensing Intrinsic Amplitude: 2.75 mV
Lead Channel Sensing Intrinsic Amplitude: 2.75 mV
Lead Channel Setting Pacing Amplitude: 1.5 V
Lead Channel Setting Pacing Amplitude: 2 V
Lead Channel Setting Pacing Pulse Width: 0.4 ms
Lead Channel Setting Sensing Sensitivity: 0.9 mV
Zone Setting Status: 755011
Zone Setting Status: 755011

## 2024-03-14 NOTE — Telephone Encounter (Signed)
 Just FYI as pt was going to reach out to DME company and insurance first before we need to do anything

## 2024-03-16 ENCOUNTER — Ambulatory Visit: Payer: Self-pay | Admitting: Cardiovascular Disease

## 2024-03-20 ENCOUNTER — Ambulatory Visit: Payer: Self-pay | Admitting: Family Medicine

## 2024-03-28 ENCOUNTER — Other Ambulatory Visit (HOSPITAL_COMMUNITY): Payer: Self-pay

## 2024-03-30 ENCOUNTER — Other Ambulatory Visit: Payer: Self-pay | Admitting: Pharmacy Technician

## 2024-03-30 ENCOUNTER — Other Ambulatory Visit: Payer: Self-pay

## 2024-03-30 NOTE — Progress Notes (Signed)
 Specialty Pharmacy Refill Coordination Note  Tony [name] is a 83 y.o. male contacted today regarding refills of specialty medication(s) Enzalutamide  (XTANDI )   Patient requested Delivery   Delivery date: 04/07/24   Verified address: [address]   [place] [address]   Medication will be filled on 04/06/24.   Spoke to patient's spouse.

## 2024-04-06 ENCOUNTER — Other Ambulatory Visit: Payer: Self-pay

## 2024-04-11 NOTE — Procedures (Signed)
Result scanned to media

## 2024-04-21 ENCOUNTER — Other Ambulatory Visit: Payer: Self-pay | Admitting: Family Medicine

## 2024-04-28 ENCOUNTER — Other Ambulatory Visit: Payer: Self-pay

## 2024-05-01 ENCOUNTER — Other Ambulatory Visit: Payer: Self-pay

## 2024-05-03 ENCOUNTER — Other Ambulatory Visit (HOSPITAL_COMMUNITY): Payer: Self-pay

## 2024-05-03 ENCOUNTER — Other Ambulatory Visit: Payer: Self-pay

## 2024-05-03 NOTE — Progress Notes (Signed)
 Specialty Pharmacy Refill Coordination Note  Spoke with Donia Bar (wife)  Tony [name] is a 83 y.o. male contacted today regarding refills of specialty medication(s) Enzalutamide  (XTANDI )  Doses on hand: 32 tablets/8 days  Patient requested: Delivery   Delivery date: 05/05/24   Verified address: [address] [place] [address]  Medication will be filled on 05/04/24.

## 2024-05-07 ENCOUNTER — Other Ambulatory Visit: Payer: Self-pay | Admitting: Family Medicine

## 2024-05-07 DIAGNOSIS — R053 Chronic cough: Secondary | ICD-10-CM

## 2024-05-16 NOTE — Assessment & Plan Note (Signed)
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 -Due to biochemical relapse in 2017, he re-started ADT -Zytiga with prednisone started in August 2017, discontinued in October 2023 because of insurance coverage. -He was switched to Xtandi 160 mg daily in October 2023 -He has responded very well so far, PSA undetectable. -Last CT scan in May and December 2023 showed no evidence of metastasis.

## 2024-05-17 ENCOUNTER — Inpatient Hospital Stay

## 2024-05-17 ENCOUNTER — Inpatient Hospital Stay: Attending: Hematology

## 2024-05-17 ENCOUNTER — Other Ambulatory Visit: Payer: Self-pay

## 2024-05-17 ENCOUNTER — Inpatient Hospital Stay: Admitting: Hematology

## 2024-05-17 ENCOUNTER — Encounter: Payer: Self-pay | Admitting: Hematology

## 2024-05-17 VITALS — BP 116/82 | HR 86 | Temp 97.7°F | Resp 18 | Ht 68.0 in | Wt 211.5 lb

## 2024-05-17 DIAGNOSIS — Z5111 Encounter for antineoplastic chemotherapy: Secondary | ICD-10-CM | POA: Diagnosis not present

## 2024-05-17 DIAGNOSIS — E669 Obesity, unspecified: Secondary | ICD-10-CM

## 2024-05-17 DIAGNOSIS — C61 Malignant neoplasm of prostate: Secondary | ICD-10-CM | POA: Insufficient documentation

## 2024-05-17 LAB — CBC WITH DIFFERENTIAL (CANCER CENTER ONLY)
Abs Immature Granulocytes: 0.04 K/uL (ref 0.00–0.07)
Basophils Absolute: 0.1 K/uL (ref 0.0–0.1)
Basophils Relative: 1 %
Eosinophils Absolute: 0.4 K/uL (ref 0.0–0.5)
Eosinophils Relative: 6 %
HCT: 39.5 % (ref 39.0–52.0)
Hemoglobin: 13 g/dL (ref 13.0–17.0)
Immature Granulocytes: 1 %
Lymphocytes Relative: 17 %
Lymphs Abs: 1.2 K/uL (ref 0.7–4.0)
MCH: 28 pg (ref 26.0–34.0)
MCHC: 32.9 g/dL (ref 30.0–36.0)
MCV: 84.9 fL (ref 80.0–100.0)
Monocytes Absolute: 0.7 K/uL (ref 0.1–1.0)
Monocytes Relative: 10 %
Neutro Abs: 4.5 K/uL (ref 1.7–7.7)
Neutrophils Relative %: 65 %
Platelet Count: 204 K/uL (ref 150–400)
RBC: 4.65 MIL/uL (ref 4.22–5.81)
RDW: 15.5 % (ref 11.5–15.5)
WBC Count: 6.9 K/uL (ref 4.0–10.5)
nRBC: 0 % (ref 0.0–0.2)

## 2024-05-17 LAB — CMP (CANCER CENTER ONLY)
ALT: 10 U/L (ref 0–44)
AST: 16 U/L (ref 15–41)
Albumin: 4 g/dL (ref 3.5–5.0)
Alkaline Phosphatase: 91 U/L (ref 38–126)
Anion gap: 8 (ref 5–15)
BUN: 15 mg/dL (ref 8–23)
CO2: 25 mmol/L (ref 22–32)
Calcium: 9.1 mg/dL (ref 8.9–10.3)
Chloride: 105 mmol/L (ref 98–111)
Creatinine: 0.8 mg/dL (ref 0.61–1.24)
GFR, Estimated: >60 mL/min (ref >60)
Glucose, Bld: 100 mg/dL — ABNORMAL HIGH (ref 70–99)
Potassium: 4 mmol/L (ref 3.5–5.1)
Sodium: 138 mmol/L (ref 135–145)
Total Bilirubin: 0.7 mg/dL (ref 0.0–1.2)
Total Protein: 6.8 g/dL (ref 6.5–8.1)

## 2024-05-17 MED ORDER — ENZALUTAMIDE 40 MG PO TABS
160.0000 mg | ORAL_TABLET | Freq: Every day | ORAL | 2 refills | Status: DC
  Filled 2024-05-17 – 2024-06-02 (×3): qty 120, 30d supply, fill #0
  Filled 2024-07-04 – 2024-07-06 (×2): qty 120, 30d supply, fill #1
  Filled 2024-08-03: qty 120, 30d supply, fill #2

## 2024-05-17 MED ORDER — LEUPROLIDE ACETATE (3 MONTH) 22.5 MG ~~LOC~~ KIT
22.5000 mg | PACK | Freq: Once | SUBCUTANEOUS | Status: AC
  Administered 2024-05-17: 22.5 mg via SUBCUTANEOUS
  Filled 2024-05-17: qty 22.5

## 2024-05-17 NOTE — Progress Notes (Signed)
 [facility]   [phone] [phone]   Clinic Follow up Note   Patient Care Team: [place], [name], MD as PCP - General (Family Medicine) Croitoru, Jerel, MD as PCP - Cardiology (Cardiology) Liane Sharyne MATSU, [facility] (Inactive) as Pharmacist (Pharmacist) Lanny Callander, MD as Attending Physician (Hematology and Oncology)  Date of Service:  05/17/2024  CHIEF COMPLAINT: f/u of prostate cancer  CURRENT THERAPY:  Eligard  22.5 mg every 3 months Xtandi  160 mg daily  Oncology History   Malignant neoplasm of prostate (HCC) -initially [id number] diagnosed in 2015, with Gleason score 4+5 = 9 and a PSA 15.7  -Status post radiation therapy with external beam and seed implant brachytherapy boost completed in October 2015 -He received 1 year androgen deprivation in 2016. -Due to biochemical relapse in 2017, he re-started ADT -Zytiga  with prednisone  started in August 2017, discontinued in October 2023 because of insurance coverage. -He was switched to Xtandi  160 mg daily in October 2023 -He has responded very well so far, PSA undetectable. -Last CT scan in May and December 2023 showed no evidence of metastasis.  Assessment & Plan Prostate cancer on active treatment Prostate cancer with relapse in 2017, currently well-managed with Xyntha for nearly two years. PSA levels remain undetectable, indicating effective management. No new symptoms or side effects reported. Maintains good energy levels and engages in regular exercise to counteract treatment-related fatigue and muscle loss. - Refill Xtandi  prescription and ensure mailing from the specialist pharmacy. - Continue Xyntha injections every three months. - Monitor PSA levels; no whole body scan needed as long as PSA remains undetectable. - Schedule follow-up appointment in three months for lab work and injection.   Plan - He is clinically doing well, will continue Eligard  injection every 3 months - Continue Xtandi  at  home - Lab, follow-up and injection in 3 months  SUMMARY OF ONCOLOGIC HISTORY: Oncology History  Malignant neoplasm of prostate (HCC)  02/23/2014 Initial Diagnosis   Malignant neoplasm of prostate      Discussed the use of AI scribe software for clinical note transcription with the patient, who gave verbal consent to proceed.  History of Present Illness Tony [name] is an 83 year old male with prostate cancer who presents for follow-up.  He was diagnosed with a relapse of prostate cancer in 2017 and has been on treatment since then. He takes [place] daily in the morning without side effects and receives injections every three months. His energy level is stable. He maintains his weight at 211 pounds, slightly down from 213 pounds at the last visit. He engages in exercise classes twice a week. He uses a cane for stability and wears compression socks for swelling, managed with Lasix . He takes baby aspirin , which he believes may contribute to easy bruising. No new symptoms, pain, or changes in appetite are present. No issues with his current medications.     All other systems were reviewed with the patient and are negative.  MEDICAL HISTORY:  Past Medical History:  Diagnosis Date   Anxiety    Arthritis    COPD (chronic obstructive pulmonary disease) (HCC)    Coronary artery disease    Dyspnea    Gastric outlet obstruction 02/03/2018   thelbert 02/03/2018   GERD (gastroesophageal reflux disease)    History of hiatal hernia    small/notes 02/03/2018   Hypertension    no meds   Presence of permanent cardiac pacemaker    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   Gleason 4+5=9,  PSA 15.65   Radiation    Sinus problem    SSS (sick sinus syndrome) (HCC)     SURGICAL HISTORY: Past Surgical History:  Procedure Laterality Date   APPENDECTOMY  2019   hx hematoma s/p appendectomy   CHOLECYSTECTOMY N/A 05/15/2022   Procedure: LAPAROSCOPIC CHOLECYSTECTOMY;  Surgeon: Dasie Leonor CROME, MD;   Location: [facility];  Service: General;  Laterality: N/A;   ESOPHAGOGASTRODUODENOSCOPY (EGD) WITH PROPOFOL  N/A 02/03/2018   Procedure: ESOPHAGOGASTRODUODENOSCOPY (EGD) WITH PROPOFOL ;  Surgeon: Rosalie Kitchens, MD;  Location: [facility];  Service: Endoscopy;  Laterality: N/A;   FRACTURE SURGERY     INCISIONAL HERNIA REPAIR N/A 10/14/2022   Procedure: INCISIONAL HERNIA REPAIR WITH MESH;  Surgeon: Dasie Leonor CROME, MD;  Location: [facility];  Service: General;  Laterality: N/A;   INSERT / REPLACE / REMOVE PACEMAKER  06/14/2018   LAPAROSCOPIC APPENDECTOMY N/A 01/21/2018   Procedure: APPENDECTOMY LAPAROSCOPIC;  Surgeon: Stevie Herlene Righter, MD;  Location: [facility] OR;  Service: General;  Laterality: N/A;   PACEMAKER IMPLANT N/A 06/14/2018   Procedure: PACEMAKER IMPLANT - Dual Chamber;  Surgeon: Francyne Headland, MD;  Location: [facility] INVASIVE CV LAB;  Service: Cardiovascular;  Laterality: N/A;   PROSTATE BIOPSY  02/08/14   gleason 4+5=9, 12/12 cores positive, 54 gm   RADIOACTIVE SEED IMPLANT N/A 06/29/2014   Procedure: RADIOACTIVE SEED IMPLANT;  Surgeon: Alm GORMAN Fragmin, MD;  Location: [facility];  Service: Urology;  Laterality: N/A;   WRIST FRACTURE SURGERY Right 1980s    I have reviewed the social history and family history with the patient and they are unchanged from previous note.  ALLERGIES:  is allergic to penicillins.  MEDICATIONS:  Current Outpatient Medications  Medication Sig Dispense Refill   acetaminophen  (TYLENOL ) 500 MG tablet Take 2 tablets (1,000 mg total) by mouth 3 (three) times daily. 30 tablet 0   albuterol  (VENTOLIN  HFA) 108 (90 Base) MCG/ACT inhaler TAKE 2 PUFFS BY MOUTH EVERY 6 HOURS AS NEEDED FOR WHEEZE OR SHORTNESS OF BREATH 8.5 each 12   aspirin  EC 81 MG tablet Take 81 mg by mouth daily.     atorvastatin  (LIPITOR) 20 MG tablet TAKE 1 TABLET BY MOUTH ONCE  DAILY 100 tablet 2   benzonatate  (TESSALON ) 100 MG capsule TAKE 1 CAPSULE BY MOUTH EVERY DAY AS NEEDED 30 capsule 1    budesonide -formoterol  (SYMBICORT ) 160-4.5 MCG/ACT inhaler 1 puff then rinse mouth, twice daily 10.2 g 6   citalopram  (CELEXA ) 10 MG tablet Take 2 tablets (20 mg total) by mouth daily.     diclofenac  Sodium (VOLTAREN ) 1 % GEL AAPLY 4 GRAMS TOPICALLY 4 TIMES DAILY (Patient taking differently: Apply 1 Application topically 4 (four) times daily as needed (pain).) 400 g 2   enzalutamide  (XTANDI ) 40 MG tablet Take 4 tablets (160 mg total) by mouth daily. 120 tablet 2   fluticasone  (FLONASE ) 50 MCG/ACT nasal spray PLACE 1 SPRAY INTO BOTH NOSTRILS DAILY AS NEEDED FOR ALLERGIES OR RHINITIS (AT BEDTIME). 48 mL 1   furosemide  (LASIX ) 20 MG tablet TAKE 1 TABLET BY MOUTH DAILY 100 tablet 2   gabapentin  (NEURONTIN ) 300 MG capsule TAKE 1 CAPSULE BY MOUTH EVERYDAY AT BEDTIME 30 capsule 3   Melatonin 10 MG CAPS Take 20 mg by mouth at bedtime.     metoprolol  tartrate (LOPRESSOR ) 25 MG tablet TAKE 1 TABLET BY MOUTH TWICE A DAY 180 tablet 3   Multiple Vitamin (MULTIVITAMIN WITH MINERALS) TABS tablet Take 0.5 tablets by mouth daily.  omeprazole  (PRILOSEC) 20 MG capsule TAKE 1 CAPSULE BY MOUTH DAILY  BEFORE BREAKFAST 100 capsule 2   Spacer/Aero-Holding Chambers DEVI To use with inhalers 1 Device 0   triamcinolone  cream (KENALOG ) 0.1 % Apply 1 Application topically daily as needed. On lower back rash 30 g 0   No current facility-administered medications for this visit.    PHYSICAL EXAMINATION: ECOG PERFORMANCE STATUS: 1 - Symptomatic but completely ambulatory  Vitals:   05/17/24 1047  BP: 116/82  Pulse: 86  Resp: 18  Temp: 97.7 F (36.5 C)  SpO2: 94%   Wt Readings from Last 3 Encounters:  05/17/24 211 lb 8 oz (95.9 kg)  03/07/24 213 lb 4 oz (96.7 kg)  02/16/24 213 lb (96.6 kg)     GENERAL:alert, no distress and comfortable SKIN: skin color, texture, turgor are normal, no rashes or significant lesions EYES: normal, Conjunctiva are pink and non-injected, sclera clear NECK: supple, thyroid  normal  size, non-tender, without nodularity LYMPH:  no palpable lymphadenopathy in the cervical, axillary  LUNGS: clear to auscultation and percussion with normal breathing effort HEART: regular rate & rhythm and no murmurs and no lower extremity edema ABDOMEN:abdomen soft, non-tender and normal bowel sounds Musculoskeletal:no cyanosis of digits and no clubbing  NEURO: alert & oriented x 3 with fluent speech, no focal motor/sensory deficits  Physical Exam   LABORATORY DATA:  I have reviewed the data as listed    Latest Ref Rng & Units 05/17/2024   10:25 AM 02/16/2024    8:13 AM 11/15/2023   12:18 PM  CBC  WBC 4.0 - 10.5 K/uL 6.9  5.0  6.5   Hemoglobin 13.0 - 17.0 g/dL 86.9  86.8  87.1   Hematocrit 39.0 - 52.0 % 39.5  39.5  39.4   Platelets 150 - 400 K/uL 204  226  272         Latest Ref Rng & Units 05/17/2024   10:25 AM 02/16/2024    8:13 AM 11/15/2023   12:18 PM  CMP  Glucose 70 - 99 mg/dL 899  880  887   BUN 8 - 23 mg/dL 15  18  22    Creatinine 0.61 - 1.24 mg/dL 9.19  8.99  9.08   Sodium 135 - 145 mmol/L 138  137  137   Potassium 3.5 - 5.1 mmol/L 4.0  4.3  4.0   Chloride 98 - 111 mmol/L 105  104  106   CO2 22 - 32 mmol/L 25  24  25    Calcium  8.9 - 10.3 mg/dL 9.1  9.3  9.2   Total Protein 6.5 - 8.1 g/dL 6.8  7.2  6.6   Total Bilirubin 0.0 - 1.2 mg/dL 0.7  0.9  0.6   Alkaline Phos 38 - 126 U/L 91  97  115   AST 15 - 41 U/L 16  19  14    ALT 0 - 44 U/L 10  12  9        RADIOGRAPHIC STUDIES: I have personally reviewed the radiological images as listed and agreed with the findings in the report. No results found.    No orders of the defined types were placed in this encounter.  All questions were answered. The patient knows to call the clinic with any problems, questions or concerns. No barriers to learning was detected. The total time spent in the appointment was 25 minutes, including review of chart and various tests results, discussions about plan of care and coordination of care  plan  Onita Mattock, MD 05/17/2024

## 2024-05-18 LAB — PROSTATE-SPECIFIC AG, SERUM (LABCORP): Prostate Specific Ag, Serum: <0.1 ng/mL (ref 0.0–4.0)

## 2024-05-29 ENCOUNTER — Other Ambulatory Visit (HOSPITAL_COMMUNITY): Payer: Self-pay

## 2024-06-02 ENCOUNTER — Other Ambulatory Visit (HOSPITAL_COMMUNITY): Payer: Self-pay

## 2024-06-02 ENCOUNTER — Other Ambulatory Visit: Payer: Self-pay

## 2024-06-05 ENCOUNTER — Other Ambulatory Visit: Payer: Self-pay

## 2024-06-05 NOTE — Progress Notes (Signed)
 Specialty Pharmacy Refill Coordination Note  Tony [name] is a 83 y.o. male contacted today regarding refills of specialty medication(s) Enzalutamide  (XTANDI )   Patient requested Delivery   Delivery date: 06/08/24   Verified address: [address] [place] [address]   Medication will be filled on 06/07/24.

## 2024-06-06 ENCOUNTER — Other Ambulatory Visit: Payer: Self-pay

## 2024-06-06 ENCOUNTER — Encounter: Payer: Self-pay | Admitting: Family Medicine

## 2024-06-06 ENCOUNTER — Ambulatory Visit (INDEPENDENT_AMBULATORY_CARE_PROVIDER_SITE_OTHER): Admitting: Family Medicine

## 2024-06-06 VITALS — BP 106/60 | HR 70 | Temp 97.3°F | Resp 16 | Ht 68.0 in | Wt 215.2 lb

## 2024-06-06 DIAGNOSIS — R21 Rash and other nonspecific skin eruption: Secondary | ICD-10-CM | POA: Diagnosis not present

## 2024-06-06 DIAGNOSIS — J9611 Chronic respiratory failure with hypoxia: Secondary | ICD-10-CM | POA: Diagnosis not present

## 2024-06-06 DIAGNOSIS — J449 Chronic obstructive pulmonary disease, unspecified: Secondary | ICD-10-CM | POA: Diagnosis not present

## 2024-06-06 DIAGNOSIS — B372 Candidiasis of skin and nail: Secondary | ICD-10-CM | POA: Diagnosis not present

## 2024-06-06 MED ORDER — CLOTRIMAZOLE-BETAMETHASONE 1-0.05 % EX CREA: 1.0000 | TOPICAL_CREAM | Freq: Two times a day (BID) | CUTANEOUS | 2 refills | Status: AC | PRN

## 2024-06-06 MED ORDER — NYSTATIN 100000 UNIT/GM EX POWD: 1.0000 | Freq: Two times a day (BID) | CUTANEOUS | 3 refills | Status: AC

## 2024-06-06 NOTE — Assessment & Plan Note (Signed)
 We discussed diagnosis, prognosis, and treatment options. Explained that problem can be chronic. Keep area dry. Nystatin  powder twice daily recommended. Stop triamcinolone  cream. Try Lotrisone  cream, small amount on affected area, twice daily as needed. Recommend using a antibacterial soap. Follow-up as needed.

## 2024-06-06 NOTE — Progress Notes (Signed)
 ACUTE VISIT Chief Complaint  Patient presents with   Rash   Discussed the use of AI scribe software for clinical note transcription with the patient, who gave verbal consent to proceed. History of Present Illness Tony [name] is an 83 year old male with PMHx significant for COPD, chronic cough, HTN, AAA, SSS s/p pacemaker placement, anxiety,and prostate cancer who presents with a rash on his back and side of his leg. He was last seen on 03/07/2024.  The rash began 2-3 weeks ago, located on his back and the side of his leg, groin area.  He has been applying triamcinolone  and cortisone creams, which have provided some relief but have not resolved the rash completely. There has been no recent contact with individuals with similar rashes, insect bites, or new medications. No associated symptoms such as fever, chills, or sore throat.  No history of eczema or similar skin conditions. He has not been on any recent vacations or started any new medications or products that could have triggered the rash. He mentions sweating between his legs, which he speculates might contribute to the rash on his leg.  He has been seen for lower back pruritus and intermittent rash, triamcinolone  cream was recommended, which has not helped.  He has been using sterile pads to keep the the groin area dry and mentions that the rash is itchy but does not interfere with his sleep.  Chronic respiratory failure and COPD: He uses a BiPAP machine at night with oxygen  set at two liters per minute, although he reports issues with the oxygen  tank not functioning properly. He continues to use his inhalers as prescribed by his pulmonologist, Symbicort  160-4.5 mcg and albuterol  inhaler. He has an upcoming appointment with his pulmonologist on November 11th.  Review of Systems  Constitutional:  Negative for activity change, appetite change and fever.  HENT:  Negative for mouth sores and sore throat.   Respiratory:  Positive  for cough (occasional). Negative for shortness of breath and wheezing.   Gastrointestinal:  Negative for abdominal pain, nausea and vomiting.  Skin:  Negative for wound.  Neurological:  Negative for syncope, weakness, numbness and headaches.  Psychiatric/Behavioral:  Negative for confusion and sleep disturbance.   See other pertinent positives and negatives in HPI.  Current Outpatient Medications on File Prior to Visit  Medication Sig Dispense Refill   acetaminophen  (TYLENOL ) 500 MG tablet Take 2 tablets (1,000 mg total) by mouth 3 (three) times daily. 30 tablet 0   albuterol  (VENTOLIN  HFA) 108 (90 Base) MCG/ACT inhaler TAKE 2 PUFFS BY MOUTH EVERY 6 HOURS AS NEEDED FOR WHEEZE OR SHORTNESS OF BREATH 8.5 each 12   aspirin  EC 81 MG tablet Take 81 mg by mouth daily.     atorvastatin  (LIPITOR) 20 MG tablet TAKE 1 TABLET BY MOUTH ONCE  DAILY 100 tablet 2   benzonatate  (TESSALON ) 100 MG capsule TAKE 1 CAPSULE BY MOUTH EVERY DAY AS NEEDED 30 capsule 1   budesonide -formoterol  (SYMBICORT ) 160-4.5 MCG/ACT inhaler 1 puff then rinse mouth, twice daily 10.2 g 6   citalopram  (CELEXA ) 10 MG tablet Take 2 tablets (20 mg total) by mouth daily.     diclofenac  Sodium (VOLTAREN ) 1 % GEL AAPLY 4 GRAMS TOPICALLY 4 TIMES DAILY (Patient taking differently: Apply 1 Application topically 4 (four) times daily as needed (pain).) 400 g 2   enzalutamide  (XTANDI ) 40 MG tablet Take 4 tablets (160 mg total) by mouth daily. 120 tablet 2   fluticasone  (FLONASE ) 50 MCG/ACT nasal  spray PLACE 1 SPRAY INTO BOTH NOSTRILS DAILY AS NEEDED FOR ALLERGIES OR RHINITIS (AT BEDTIME). 48 mL 1   furosemide  (LASIX ) 20 MG tablet TAKE 1 TABLET BY MOUTH DAILY 100 tablet 2   gabapentin  (NEURONTIN ) 300 MG capsule TAKE 1 CAPSULE BY MOUTH EVERYDAY AT BEDTIME 30 capsule 3   Melatonin 10 MG CAPS Take 20 mg by mouth at bedtime.     metoprolol  tartrate (LOPRESSOR ) 25 MG tablet TAKE 1 TABLET BY MOUTH TWICE A DAY 180 tablet 3   Multiple Vitamin  (MULTIVITAMIN WITH MINERALS) TABS tablet Take 0.5 tablets by mouth daily.     omeprazole  (PRILOSEC) 20 MG capsule TAKE 1 CAPSULE BY MOUTH DAILY  BEFORE BREAKFAST 100 capsule 2   Spacer/Aero-Holding Chambers DEVI To use with inhalers 1 Device 0   No current facility-administered medications on file prior to visit.    Past Medical History:  Diagnosis Date   Anxiety    Arthritis    COPD (chronic obstructive pulmonary disease) (HCC)    Coronary artery disease    Dyspnea    Gastric outlet obstruction 02/03/2018   thelbert 02/03/2018   GERD (gastroesophageal reflux disease)    History of hiatal hernia    small/notes 02/03/2018   Hypertension    no meds   Presence of permanent cardiac pacemaker    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   Gleason 4+5=9, PSA 15.65   Radiation    Sinus problem    SSS (sick sinus syndrome) (HCC)    Allergies  Allergen Reactions   Penicillins Rash    Has patient had a PCN reaction causing immediate rash, facial/tongue/throat swelling, SOB or lightheadedness with hypotension: Yes Has patient had a PCN reaction causing severe rash involving mucus membranes or skin necrosis: No Has patient had a PCN reaction that required hospitalization: No Has patient had a PCN reaction occurring within the last 10 years: No If all of the above answers are NO, then may proceed with Cephalosporin use.    Social History   Socioeconomic History   Marital status: Married    Spouse name: Not on file   Number of children: 3   Years of education: Not on file   Highest education level: Not on file  Occupational History   Occupation: Estate agent    Employer: [employer]    Comment: retired   Occupation: Production designer, theatre/television/film    Comment: [place]ired  Tobacco Use   Smoking status: Former    Types: Cigars    Quit date: 09/14/2012    Years since quitting: 11.7   Smokeless tobacco: Never   Tobacco comments:    little cigars; the small ones  Vaping Use   Vaping status: Never Used   Substance and Sexual Activity   Alcohol  use: Not Currently    Alcohol /week: 14.0 standard drinks of alcohol     Types: 14 Cans of beer per week    Comment: quit in 2000   Drug use: No   Sexual activity: Not Currently  Other Topics Concern   Not on file  Social History Narrative   Lives with wife and 2 dogs in one level home; daughter and her family co-habitate.   Has three daughters, all in [place], supportive. Five grandchildren.   Wants to return to doing silver sneakers once gym re-opens.         Social Drivers of Corporate investment banker Strain: Low Risk  (07/29/2022)   Overall Financial Resource Strain (CARDIA)    Difficulty of Paying Living  Expenses: Not hard at all  Recent Concern: Financial Resource Strain - Medium Risk (05/27/2022)   Overall Financial Resource Strain (CARDIA)    Difficulty of Paying Living Expenses: Somewhat hard  Food Insecurity: No Food Insecurity (10/15/2022)   Hunger Vital Sign    Worried About Running Out of Food in the Last Year: Never true    Ran Out of Food in the Last Year: Never true  Transportation Needs: No Transportation Needs (10/15/2022)   PRAPARE - Administrator, Civil Service (Medical): No    Lack of Transportation (Non-Medical): No  Physical Activity: Insufficiently Active (07/29/2022)   Exercise Vital Sign    Days of Exercise per Week: 5 days    Minutes of Exercise per Session: 20 min  Stress: No Stress Concern Present (07/29/2022)   [employer] of Occupational Health - Occupational Stress Questionnaire    Feeling of Stress : Not at all  Social Connections: Socially Integrated (07/29/2022)   Social Connection and Isolation Panel    Frequency of Communication with Friends and Family: More than three times a week    Frequency of Social Gatherings with Friends and Family: More than three times a week    Attends Religious Services: More than 4 times per year    Active Member of Clubs or Organizations: Yes    Attends  Banker Meetings: More than 4 times per year    Marital Status: Married   Vitals:   06/06/24 0816  BP: 106/60  Pulse: 70  Resp: 16  Temp: (!) 97.3 F (36.3 C)  SpO2: 93%   Body mass index is 32.72 kg/m.  Physical Exam Vitals and nursing note reviewed.  Constitutional:      General: He is not in acute distress.    Appearance: He is well-developed.  HENT:     Head: Normocephalic and atraumatic.  Eyes:     Conjunctiva/sclera: Conjunctivae normal.  Cardiovascular:     Rate and Rhythm: Normal rate and regular rhythm.     Heart sounds: No murmur heard. Pulmonary:     Effort: Pulmonary effort is normal. No respiratory distress.     Breath sounds: Normal breath sounds.  Skin:    General: Skin is warm.     Findings: Rash present. No erythema.         Comments: Right inguinal area with erythema, no locl heat or tenderness. No odor or drainage.  Neurological:     General: No focal deficit present.     Mental Status: He is oriented to person, place, and time.     Comments: Unstable gait assisted with a cane.  Psychiatric:        Mood and Affect: Mood and affect normal.   ASSESSMENT AND PLAN:  Mr. Canterbury was seen today for skin rash.  Intertriginous candidiasis Assessment & Plan: We discussed diagnosis, prognosis, and treatment options. Explained that problem can be chronic. Keep area dry. Nystatin  powder twice daily recommended. Stop triamcinolone  cream. Try Lotrisone  cream, small amount on affected area, twice daily as needed. Recommend using a antibacterial soap. Follow-up as needed.  Orders: -     Clotrimazole -Betamethasone ; Apply 1 Application topically 2 (two) times daily as needed.  Dispense: 30 g; Refill: 2 -     Nystatin ; Apply 1 Application topically 2 (two) times daily.  Dispense: 60 g; Refill: 3  COPD mixed type (HCC) Assessment & Plan: Problem otherwise stable. Currently on Symbicort  160-4.5 mcg twice daily and albuterol  inhaler 1 to 2  puff every 6 hours as needed. He is following with pulmonologist regularly, next appointment 07/2024.   Chronic respiratory failure with hypoxia [facility]) Assessment & Plan: Problem is a stable. Currently he is on 2 L of supplemental O2 at bedtime through his BiPAP. Follows with pulmonology regularly.  Rash and nonspecific skin eruption On right-sided lower back.  We had discussed son pruritus in the same area in the past.  Examination does not suggest a serious process. Triamcinolone  cream did not help, so discontinued. He prefers to hold on dermatology referral for now. He could try Lotrisone  cream on affected area. Monitor for new symptoms.  I personally spent a total of 32 minutes in the care of the patient today including preparing to see the patient, getting/reviewing separately obtained history, performing a medically appropriate exam/evaluation, counseling and educating, and documenting clinical information in the EHR.  Return if symptoms worsen or fail to improve, for keep next appointment.  Kylieann Eagles G. [place], MD  [facility]. [place] office.

## 2024-06-06 NOTE — Assessment & Plan Note (Signed)
 Problem otherwise stable. Currently on Symbicort  160-4.5 mcg twice daily and albuterol  inhaler 1 to 2 puff every 6 hours as needed. He is following with pulmonologist regularly, next appointment 07/2024.

## 2024-06-06 NOTE — Patient Instructions (Addendum)
 A few things to remember from today's visit:  COPD mixed type (HCC)  Chronic respiratory failure with hypoxia (HCC)  Rash and nonspecific skin eruption  Intertriginous candidiasis  Nystatin  powder 2 times daily. Keep area in groin dry. Use antibacterial soap.  Lotrisone , small amount 1-2 times per day as neede.d  Lesion on lower back could be eczema. Let me know if you decide about dermatology referral.  If you need refills for medications you take chronically, please call your pharmacy. Do not use My Chart to request refills or for acute issues that need immediate attention. If you send a my chart message, it may take a few days to be addressed, specially if I am not in the office.  Please be sure medication list is accurate. If a new problem present, please set up appointment sooner than planned today.

## 2024-06-06 NOTE — Assessment & Plan Note (Signed)
 Problem is a stable. Currently he is on 2 L of supplemental O2 at bedtime through his BiPAP. Follows with pulmonology regularly.

## 2024-06-13 ENCOUNTER — Ambulatory Visit: Payer: Medicare Other

## 2024-06-13 DIAGNOSIS — I495 Sick sinus syndrome: Secondary | ICD-10-CM

## 2024-06-14 LAB — CUP PACEART REMOTE DEVICE CHECK
Battery Remaining Longevity: 97 mo
Battery Voltage: 2.99 V
Brady Statistic AP VP Percent: 0.06 %
Brady Statistic AP VS Percent: 70.19 %
Brady Statistic AS VP Percent: 0.02 %
Brady Statistic AS VS Percent: 29.73 %
Brady Statistic RA Percent Paced: 69.95 %
Brady Statistic RV Percent Paced: 0.08 %
Date Time Interrogation Session: 20250929204554
Implantable Lead Connection Status: 753985
Implantable Lead Connection Status: 753985
Implantable Lead Implant Date: 20191001
Implantable Lead Implant Date: 20191001
Implantable Lead Location: 753859
Implantable Lead Location: 753860
Implantable Lead Model: 5076
Implantable Lead Model: 5076
Implantable Pulse Generator Implant Date: 20191001
Lead Channel Impedance Value: 304 Ohm
Lead Channel Impedance Value: 304 Ohm
Lead Channel Impedance Value: 380 Ohm
Lead Channel Impedance Value: 494 Ohm
Lead Channel Pacing Threshold Amplitude: 0.375 V
Lead Channel Pacing Threshold Amplitude: 1 V
Lead Channel Pacing Threshold Pulse Width: 0.4 ms
Lead Channel Pacing Threshold Pulse Width: 0.4 ms
Lead Channel Sensing Intrinsic Amplitude: 2.25 mV
Lead Channel Sensing Intrinsic Amplitude: 2.25 mV
Lead Channel Sensing Intrinsic Amplitude: 2.375 mV
Lead Channel Sensing Intrinsic Amplitude: 2.375 mV
Lead Channel Setting Pacing Amplitude: 1.5 V
Lead Channel Setting Pacing Amplitude: 2 V
Lead Channel Setting Pacing Pulse Width: 0.4 ms
Lead Channel Setting Sensing Sensitivity: 0.9 mV
Zone Setting Status: 755011
Zone Setting Status: 755011

## 2024-06-14 NOTE — Progress Notes (Signed)
 Remote PPM Transmission

## 2024-06-15 ENCOUNTER — Other Ambulatory Visit: Payer: Self-pay | Admitting: Family Medicine

## 2024-06-15 DIAGNOSIS — R21 Rash and other nonspecific skin eruption: Secondary | ICD-10-CM

## 2024-06-15 DIAGNOSIS — M545 Low back pain, unspecified: Secondary | ICD-10-CM

## 2024-06-16 ENCOUNTER — Ambulatory Visit: Payer: Self-pay | Admitting: Cardiovascular Disease

## 2024-06-18 ENCOUNTER — Other Ambulatory Visit: Payer: Self-pay | Admitting: Family Medicine

## 2024-06-18 DIAGNOSIS — F418 Other specified anxiety disorders: Secondary | ICD-10-CM

## 2024-06-19 NOTE — Progress Notes (Signed)
 Remote PPM Transmission

## 2024-06-22 ENCOUNTER — Other Ambulatory Visit: Payer: Self-pay

## 2024-06-22 NOTE — Progress Notes (Signed)
 Specialty Pharmacy Ongoing Clinical Assessment Note  Tony [name] is a 83 y.o. male who is being followed by the specialty pharmacy service for RxSp Oncology   Patient's specialty medication(s) reviewed today: Enzalutamide  (XTANDI )   Missed doses in the last 4 weeks: 0   Patient/Caregiver did not have any additional questions or concerns.   Therapeutic benefit summary: Patient is achieving benefit   Adverse events/side effects summary: No adverse events/side effects   Patient's therapy is appropriate to: Continue    Goals Addressed             This Visit's Progress    Slow Disease Progression   On track    Patient is on track. Patient will maintain adherence. PSA remains undetectable as of 05/17/24 labs.          Follow up: 6 months  Skylan Gift M Lacora Folmer Specialty Pharmacist

## 2024-06-29 ENCOUNTER — Other Ambulatory Visit: Payer: Self-pay

## 2024-07-03 ENCOUNTER — Ambulatory Visit (INDEPENDENT_AMBULATORY_CARE_PROVIDER_SITE_OTHER): Admitting: Family Medicine

## 2024-07-03 ENCOUNTER — Encounter: Payer: Self-pay | Admitting: Family Medicine

## 2024-07-03 VITALS — BP 100/59 | Temp 97.7°F | Resp 16 | Ht 68.0 in | Wt 212.4 lb

## 2024-07-03 DIAGNOSIS — I10 Essential (primary) hypertension: Secondary | ICD-10-CM | POA: Diagnosis not present

## 2024-07-03 DIAGNOSIS — J988 Other specified respiratory disorders: Secondary | ICD-10-CM

## 2024-07-03 DIAGNOSIS — R059 Cough, unspecified: Secondary | ICD-10-CM | POA: Diagnosis not present

## 2024-07-03 LAB — POCT INFLUENZA A/B
Influenza A, POC: NEGATIVE
Influenza B, POC: NEGATIVE

## 2024-07-03 LAB — POC COVID19 BINAXNOW: SARS Coronavirus 2 Ag: NEGATIVE

## 2024-07-03 MED ORDER — DOXYCYCLINE HYCLATE 100 MG PO TABS: 100.0000 mg | ORAL_TABLET | Freq: Two times a day (BID) | ORAL | 0 refills | Status: AC

## 2024-07-03 NOTE — Progress Notes (Unsigned)
 ACUTE VISIT Chief Complaint  Patient presents with   Cough    Pt reports coughing up yellow phlegms. Been coughing more 3-4 days ago.    Facial Pain   Sinus Problem    Pt is with wife, reports he possible have sinus infection. Experiencing facial pain 3-4 days ago, postal drainage. Denied fever, tired. Did had sore throat. Sx resolved.     HPI: [name] is a 83 y.o. male, who is here today complaining of *** HPI Discussed the use of AI scribe software for clinical note transcription with the patient, who gave verbal consent to proceed.  History of Present Illness Tony [name] is an 83 year old male who presents with nasal congestion, runny nose, and cough.  He has been experiencing nasal congestion, runny nose, and cough for about a week, accompanied by significant facial pain, particularly in the cheek area. He initially suspected a viral illness and checked for COVID and flu, though no results were mentioned. He has been taking an allergy pill to manage symptoms but has not used saline irrigation or Flonase  despite having them at home.  He experienced fever and chills, which subsided approximately two days ago. Although he did not measure his temperature, he felt feverish until then. No wheezing or shortness of breath is present. His cough is productive, but there is no blood in the sputum. He reports no known sick contacts.  He had ear pain for a couple of days, which resolved after using ear drops. There was no drainage from the ears, and he no longer experiences ear pain.  He is currently taking metoprolol  25 mg twice daily. His blood pressure tends to run on the lower side, and he is no longer taking amitriptyline or amlodipine .   Review of Systems See other pertinent positives and negatives in HPI.  Current Outpatient Medications on File Prior to Visit  Medication Sig Dispense Refill   acetaminophen  (TYLENOL ) 500 MG tablet Take 2 tablets (1,000 mg  total) by mouth 3 (three) times daily. 30 tablet 0   albuterol  (VENTOLIN  HFA) 108 (90 Base) MCG/ACT inhaler TAKE 2 PUFFS BY MOUTH EVERY 6 HOURS AS NEEDED FOR WHEEZE OR SHORTNESS OF BREATH 8.5 each 12   aspirin  EC 81 MG tablet Take 81 mg by mouth daily.     atorvastatin  (LIPITOR) 20 MG tablet TAKE 1 TABLET BY MOUTH ONCE  DAILY 100 tablet 2   benzonatate  (TESSALON ) 100 MG capsule TAKE 1 CAPSULE BY MOUTH EVERY DAY AS NEEDED 30 capsule 1   budesonide -formoterol  (SYMBICORT ) 160-4.5 MCG/ACT inhaler 1 puff then rinse mouth, twice daily 10.2 g 6   citalopram  (CELEXA ) 10 MG tablet TAKE 1 TABLET BY MOUTH DAILY 100 tablet 2   clotrimazole -betamethasone  (LOTRISONE ) cream Apply 1 Application topically 2 (two) times daily as needed. 30 g 2   diclofenac  Sodium (VOLTAREN ) 1 % GEL AAPLY 4 GRAMS TOPICALLY 4 TIMES DAILY (Patient taking differently: Apply 1 Application topically 4 (four) times daily as needed (pain).) 400 g 2   enzalutamide  (XTANDI ) 40 MG tablet Take 4 tablets (160 mg total) by mouth daily. 120 tablet 2   fluticasone  (FLONASE ) 50 MCG/ACT nasal spray PLACE 1 SPRAY INTO BOTH NOSTRILS DAILY AS NEEDED FOR ALLERGIES OR RHINITIS (AT BEDTIME). 48 mL 1   furosemide  (LASIX ) 20 MG tablet TAKE 1 TABLET BY MOUTH DAILY 100 tablet 2   gabapentin  (NEURONTIN ) 300 MG capsule TAKE 1 CAPSULE BY MOUTH EVERYDAY AT BEDTIME 30 capsule 3   Melatonin  10 MG CAPS Take 20 mg by mouth at bedtime.     metoprolol  tartrate (LOPRESSOR ) 25 MG tablet TAKE 1 TABLET BY MOUTH TWICE A DAY 180 tablet 3   Multiple Vitamin (MULTIVITAMIN WITH MINERALS) TABS tablet Take 0.5 tablets by mouth daily.     nystatin  (MYCOSTATIN /NYSTOP ) powder Apply 1 Application topically 2 (two) times daily. 60 g 3   omeprazole  (PRILOSEC) 20 MG capsule TAKE 1 CAPSULE BY MOUTH DAILY  BEFORE BREAKFAST 100 capsule 2   Spacer/Aero-Holding Chambers DEVI To use with inhalers 1 Device 0   No current facility-administered medications on file prior to visit.   Past  Medical History:  Diagnosis Date   Anxiety    Arthritis    COPD (chronic obstructive pulmonary disease) (HCC)    Coronary artery disease    Dyspnea    Gastric outlet obstruction 02/03/2018   thelbert 02/03/2018   GERD (gastroesophageal reflux disease)    History of hiatal hernia    small/notes 02/03/2018   Hypertension    no meds   Presence of permanent cardiac pacemaker    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   Gleason 4+5=9, PSA 15.65   Radiation    Sinus problem    SSS (sick sinus syndrome) (HCC)    Allergies  Allergen Reactions   Penicillins Rash    Has patient had a PCN reaction causing immediate rash, facial/tongue/throat swelling, SOB or lightheadedness with hypotension: Yes Has patient had a PCN reaction causing severe rash involving mucus membranes or skin necrosis: No Has patient had a PCN reaction that required hospitalization: No Has patient had a PCN reaction occurring within the last 10 years: No If all of the above answers are NO, then may proceed with Cephalosporin use.    Social History   Socioeconomic History   Marital status: Married    Spouse name: Not on file   Number of children: 3   Years of education: Not on file   Highest education level: Not on file  Occupational History   Occupation: Estate agent    Employer: [employer]    Comment: retired   Occupation: Production designer, theatre/television/film    Comment: [employer] [place]-retired  Tobacco Use   Smoking status: Former    Types: Cigars    Quit date: 09/14/2012    Years since quitting: 11.8   Smokeless tobacco: Never   Tobacco comments:    little cigars; the small ones  Vaping Use   Vaping status: Never Used  Substance and Sexual Activity   Alcohol  use: Not Currently    Alcohol /week: 14.0 standard drinks of alcohol     Types: 14 Cans of beer per week    Comment: quit in 2000   Drug use: No   Sexual activity: Not Currently  Other Topics Concern   Not on file  Social History Narrative   Lives with wife and 2 dogs in one level  home; daughter and her family co-habitate.   Has three daughters, all in [employer], supportive. Five grandchildren.   Wants to return to doing silver sneakers once gym re-opens.         Social Drivers of Corporate investment banker Strain: Low Risk  (07/29/2022)   Overall Financial Resource Strain (CARDIA)    Difficulty of Paying Living Expenses: Not hard at all  Recent Concern: Financial Resource Strain - Medium Risk (05/27/2022)   Overall Financial Resource Strain (CARDIA)    Difficulty of Paying Living Expenses: Somewhat hard  Food Insecurity: No Food Insecurity (10/15/2022)  Hunger Vital Sign    Worried About Running Out of Food in the Last Year: Never true    Ran Out of Food in the Last Year: Never true  Transportation Needs: No Transportation Needs (10/15/2022)   PRAPARE - Administrator, Civil Service (Medical): No    Lack of Transportation (Non-Medical): No  Physical Activity: Insufficiently Active (07/29/2022)   Exercise Vital Sign    Days of Exercise per Week: 5 days    Minutes of Exercise per Session: 20 min  Stress: No Stress Concern Present (07/29/2022)   [employer] of Occupational Health - Occupational Stress Questionnaire    Feeling of Stress : Not at all  Social Connections: Socially Integrated (07/29/2022)   Social Connection and Isolation Panel    Frequency of Communication with Friends and Family: More than three times a week    Frequency of Social Gatherings with Friends and Family: More than three times a week    Attends Religious Services: More than 4 times per year    Active Member of [employer] or Organizations: Yes    Attends Banker Meetings: More than 4 times per year    Marital Status: Married   Vitals:   07/03/24 1002  BP: (!) 100/59  Resp: 16  Temp: 97.7 F (36.5 C)  SpO2: 95%   Body mass index is 32.3 kg/m.  Physical Exam Vitals and nursing note reviewed.  Constitutional:      General: He is not in acute distress.     Appearance: He is well-developed. He is not ill-appearing.  HENT:     Head: Normocephalic and atraumatic.     Right Ear: Tympanic membrane, ear canal and external ear normal.     Left Ear: Tympanic membrane, ear canal and external ear normal.     Nose: Rhinorrhea present.     Right Turbinates: Enlarged.     Left Turbinates: Enlarged.     Right Sinus: No maxillary sinus tenderness or frontal sinus tenderness.     Left Sinus: No maxillary sinus tenderness or frontal sinus tenderness.     Mouth/Throat:     Pharynx: Postnasal drip present. No oropharyngeal exudate or posterior oropharyngeal erythema.     Comments: Maxillary sinuses discomfort with pressing areas. Eyes:     Conjunctiva/sclera: Conjunctivae normal.  Cardiovascular:     Rate and Rhythm: Normal rate and regular rhythm.     Heart sounds: No murmur heard. Pulmonary:     Effort: Pulmonary effort is normal. No respiratory distress.     Breath sounds: Normal breath sounds. No stridor.  Lymphadenopathy:     Cervical: No cervical adenopathy.  Skin:    General: Skin is warm.     Findings: No erythema or rash.  Neurological:     Mental Status: He is alert and oriented to person, place, and time.     Comments: Unstable gait assisted with a cane.  Psychiatric:        Mood and Affect: Mood and affect normal.    ASSESSMENT AND PLAN: Respiratory tract infection -     Doxycycline  Hyclate; Take 1 tablet (100 mg total) by mouth 2 (two) times daily for 7 days.  Dispense: 14 tablet; Refill: 0   We discussed possible etiologies, most likely viral, seasonal allergies could also be playing a role. Explained that I do not think antibiotic is needed but his symptoms are persistent in 5 days or some or get worse, he can proceed with antibiotic treatment. Recommend  trying Flonase  nasal spray at bedtime for 10 to 14 days and nasal saline irrigations during the day as needed. Plain Mucinex  may help with the productive cough. OTC antihistaminic  could cause dryness,*** *** Return if symptoms worsen or fail to improve, for keep next appointment.  Lynwood Kubisiak G. [place], MD  [facility]. [place] office.  Discharge Instructions   None

## 2024-07-03 NOTE — Patient Instructions (Addendum)
 A few things to remember from today's visit:  Respiratory tract infection Most likely viral but allergies can also aggravate symptoms. I do not think antibiotic treatment is needed at this time. Cough and congestion can last a few more days and even weeks. If fever, I need to know. I sent antibiotic to your pharmacy, if symptoms get worse of facial discomfort is not any better , you can start treatment. If persistent after, we could arrange a sinus CT.  Try Flonase  nasal s[ray at bedtime for 10-14 days and nasal saline irrigations during the day as needed. Plain mucinex  may help with acute productive cough. If decide to take antibiotic, be careful with sun exposure.  Monitor blood pressure at home, on lower normal range today.  If you need refills for medications you take chronically, please call your pharmacy. Do not use My Chart to request refills or for acute issues that need immediate attention. If you send a my chart message, it may take a few days to be addressed, specially if I am not in the office.  Please be sure medication list is accurate. If a new problem present, please set up appointment sooner than planned today.

## 2024-07-04 ENCOUNTER — Other Ambulatory Visit (HOSPITAL_COMMUNITY): Payer: Self-pay

## 2024-07-06 ENCOUNTER — Other Ambulatory Visit: Payer: Self-pay

## 2024-07-06 ENCOUNTER — Other Ambulatory Visit (HOSPITAL_COMMUNITY): Payer: Self-pay

## 2024-07-06 NOTE — Progress Notes (Signed)
 Specialty Pharmacy Refill Coordination Note  Tony [name] is a 83 y.o. male contacted today regarding refills of specialty medication(s) Enzalutamide  (XTANDI )   Patient requested Delivery   Delivery date: 07/11/24   Verified address: [address] [place] [address]   Medication will be filled on 07/10/24.

## 2024-07-10 ENCOUNTER — Other Ambulatory Visit: Payer: Self-pay

## 2024-07-14 ENCOUNTER — Other Ambulatory Visit: Payer: Self-pay

## 2024-07-17 NOTE — Progress Notes (Unsigned)
 HPI M former smoker followed for OSA, COPD, hx Respiratory Failure, dCHF w pulmonary edema, hx E.Coli Bacteremia, CAD, Chronic AFib/ Pacemaker, Prostate Cancer, Aortic Aneurysm HST 09/02/20- AHI 40.8/ hr, desaturation to 66%/ avbg 88%, body weight 228 lbs Pending CPAP titration w O2 check on 2/22 PFT 09/09/20- Minimal obstruction CPAP titration 11/05/20- to BIPAP 25/21 and required O2 3l Walk Test on Room Air- O2 qualifying 02/16/22- min sat 87% after 2 laps, Max HR 92 (paced).  =====================================================================   07/26/23-  81 yoM former smoker followed for OSA, COPD, hx Respiratory Failure, CAD, dCHF w pulmonary edema, hx E.Coli Bacteremia, Chronic AFib/ Pacemaker, Prostate Cancer,  Celiac Stenosis, Obesity, Covid pneumonia April2023, Cholelithiasis, Aortic aneurysms followed by Dr Lucas -Symbicort  160 (1 puff bid), Proair  hfa    [facility]una BiPAP([employer]) Min EPAP 8, Max IPAP 20 , avg 15/9 PS 6, with O2 3L for sleep/ [employer] Download-compliance  33.3%, AHI 1.4/hr Body weight today 210 lbs                                Wife here Download reviewed- many short nights.  Flu shot tomorrow at PCP. Takes CPAP off for bathroom and forgets to put it on. Dicussed. Good control when used. Brought inhalers. Using Symbicort  160 1 puff twice daily (2 puffs makes him dizzy). Albuterol  occasionally for coughing.   07/18/24-82 yoM former smoker followed for OSA, COPD, hx Respiratory Failure, CAD, dCHF w pulmonary edema, hx E.Coli Bacteremia, Chronic AFib/ Pacemaker, Prostate Cancer,  Celiac Stenosis, Obesity, Covid pneumonia April2023, Cholelithiasis, Aortic aneurysms followed by Dr Lucas -Symbicort  160 (1 puff bid), Proair  hfa    Luna BiPAP([facility]) Min EPAP 8, Max IPAP 20 , avg 15/9 PS 6, with O2 3L for sleep/ [employer] Download-compliance  - SD card no data Body weight today  209 lbs                              Wife here Download reviewed- many short nights.   CXR  11/01/23  1V IMPRESSION: No active disease. Cardiomegaly. Dual lead pacemaker. Right clavicle fracture difficult to see on this image.   ROS-see HPI   + = positive Constitutional:    weight loss, night sweats, fevers, chills, fatigue, lassitude. HEENT:    headaches, difficulty swallowing, tooth/dental problems, sore throat,       sneezing, itching, ear ache, nasal congestion, post nasal drip, snoring CV:    chest pain, orthopnea, PND, swelling in lower extremities, anasarca,                                   dizziness, +palpitations Resp:   +shortness of breath with exertion or at rest.                productive cough,   +non-productive cough, coughing up of blood.              change in color of mucus.  wheezing.   Skin:    rash or lesions. GI:  + heartburn, indigestion, abdominal pain, nausea, vomiting, diarrhea,                 change in bowel habits, loss of appetite GU: dysuria, change in color of urine, no urgency or frequency.   flank pain. MS:   joint pain, stiffness, decreased  range of motion, back pain. Neuro-     nothing unusual Psych:  change in mood or affect.  depression or +anxiety.   memory loss.  OBJ- Physical Exam General- Alert, Oriented, Affect-appropriate, Distress- none acute, + obese Skin- rash-none, lesions- none, excoriation- none Lymphadenopathy- none Head- atraumatic            Eyes- Gross vision intact, PERRLA, conjunctivae and secretions clear            Ears- Hearing, canals-normal            Nose- Clear, no-Septal dev, mucus, polyps, erosion, perforation             Throat- Mallampati IV , mucosa clear , drainage- none, tonsils- atrophic, + teeth Neck- flexible , trachea midline, no stridor , thyroid  nl, carotid no bruit Chest - symmetrical excursion , unlabored           Heart/CV- RRR , no murmur , no gallop  , no rub, nl s1 s2                           - JVD- none , edema+trace, stasis changes- none, varices- none           Lung- +  unlabored,  wheeze-none, cough- none , dullness-none, rub- none           Chest wall- +pacemaker L  Abd-  Br/ Gen/ Rectal- Not done, not indicated Extrem- cyanosis- none, clubbing, none, atrophy- none, strength- nl, + cane Neuro- grossly intact to observation

## 2024-07-18 ENCOUNTER — Ambulatory Visit: Admitting: Internal Medicine

## 2024-07-18 ENCOUNTER — Other Ambulatory Visit: Payer: Self-pay | Admitting: Family Medicine

## 2024-07-18 ENCOUNTER — Encounter: Payer: Self-pay | Admitting: Internal Medicine

## 2024-07-18 VITALS — BP 122/68 | HR 91 | Temp 97.6°F | Ht 68.0 in | Wt 209.4 lb

## 2024-07-18 DIAGNOSIS — J449 Chronic obstructive pulmonary disease, unspecified: Secondary | ICD-10-CM | POA: Diagnosis not present

## 2024-07-18 DIAGNOSIS — G4733 Obstructive sleep apnea (adult) (pediatric): Secondary | ICD-10-CM | POA: Diagnosis not present

## 2024-07-18 DIAGNOSIS — M545 Low back pain, unspecified: Secondary | ICD-10-CM

## 2024-07-18 MED ORDER — BUDESONIDE-FORMOTEROL FUMARATE 160-4.5 MCG/ACT IN AERO: INHALATION_SPRAY | RESPIRATORY_TRACT | 12 refills | Status: AC

## 2024-07-18 MED ORDER — ALBUTEROL SULFATE HFA 108 (90 BASE) MCG/ACT IN AERS: INHALATION_SPRAY | RESPIRATORY_TRACT | 12 refills | Status: AC

## 2024-07-18 NOTE — Patient Instructions (Addendum)
 Order- DME [facility]- continue BIPAP and renew supplies. Please replace SD card.  Scripts sent refilling Symbicort  - 2 puffs , then rinse mouth, twice daily                    And albuterol  hfa rescue inhaler- inhale 2 puffs every 6 hours, IF NEEDED

## 2024-07-19 ENCOUNTER — Ambulatory Visit

## 2024-07-23 ENCOUNTER — Other Ambulatory Visit: Payer: Self-pay | Admitting: Family Medicine

## 2024-07-23 DIAGNOSIS — I10 Essential (primary) hypertension: Secondary | ICD-10-CM

## 2024-07-24 ENCOUNTER — Ambulatory Visit: Payer: Self-pay | Admitting: Family Medicine

## 2024-07-24 ENCOUNTER — Ambulatory Visit: Admitting: Family Medicine

## 2024-07-24 ENCOUNTER — Encounter: Payer: Self-pay | Admitting: Family Medicine

## 2024-07-24 VITALS — BP 124/76 | HR 63 | Temp 97.9°F | Resp 17 | Ht 68.0 in | Wt 211.6 lb

## 2024-07-24 DIAGNOSIS — E7800 Pure hypercholesterolemia, unspecified: Secondary | ICD-10-CM | POA: Diagnosis not present

## 2024-07-24 DIAGNOSIS — Z23 Encounter for immunization: Secondary | ICD-10-CM | POA: Diagnosis not present

## 2024-07-24 DIAGNOSIS — I10 Essential (primary) hypertension: Secondary | ICD-10-CM

## 2024-07-24 DIAGNOSIS — Z0000 Encounter for general adult medical examination without abnormal findings: Secondary | ICD-10-CM | POA: Diagnosis not present

## 2024-07-24 DIAGNOSIS — R7303 Prediabetes: Secondary | ICD-10-CM

## 2024-07-24 DIAGNOSIS — R053 Chronic cough: Secondary | ICD-10-CM

## 2024-07-24 DIAGNOSIS — F419 Anxiety disorder, unspecified: Secondary | ICD-10-CM

## 2024-07-24 LAB — LIPID PANEL
Cholesterol: 154 mg/dL (ref 0–200)
HDL: 62.8 mg/dL (ref >39.00)
LDL Cholesterol: 74 mg/dL (ref 0–99)
NonHDL: 91.26
Total CHOL/HDL Ratio: 2
Triglycerides: 87 mg/dL (ref 0.0–149.0)
VLDL: 17.4 mg/dL (ref 0.0–40.0)

## 2024-07-24 LAB — HEMOGLOBIN A1C: Hgb A1c MFr Bld: 6.4 % (ref 4.6–6.5)

## 2024-07-24 NOTE — Assessment & Plan Note (Signed)
 Encouraged consistency with a healthy lifestyle for diabetes prevention. Last hemoglobin A1c 6.3 in 03/2023. Further recommendation will be given according to lipid panel result.

## 2024-07-24 NOTE — Assessment & Plan Note (Signed)
 BP adequately controlled. Currently he is on metoprolol  titrate 25 mg 1/2 tablet twice daily on low salt diet. Eye exam is current.

## 2024-07-24 NOTE — Patient Instructions (Addendum)
 A few things to remember from today's visit:  Routine general medical examination at a health care facility  Anxiety disorder, unspecified type  Chronic cough  Prediabetes - Plan: Hemoglobin A1c  Hypercholesterolemia - Plan: Lipid panel  Need for immunization against influenza - Plan: Flu vaccine HIGH DOSE PF(Fluzone Trivalent)  If you need refills for medications you take chronically, please call your pharmacy. Do not use My Chart to request refills or for acute issues that need immediate attention. If you send a my chart message, it may take a few days to be addressed, specially if I am not in the office.  Please be sure medication list is accurate. If a new problem present, please set up appointment sooner than planned today.

## 2024-07-24 NOTE — Progress Notes (Signed)
 Chief Complaint  Patient presents with   Medical Management of Chronic Issues   Annual Exam   History of Present Illness  Discussed the use of AI scribe software for clinical note transcription with the patient, who gave verbal consent to proceed.  Tony [name] is an 83 year old male  with past medical history significant for COPD, chronic cough, hypertension, AAA, SSS status post pacemaker placement, anxiety, and prostate cancer here today with his wife for chronic disease management and he is also due for his CPE.  Last CPE on 11/18/2020. Last AWV 09/14/2023.  Last seen on 07/03/2024 for acute visit, symptoms he was reported at that time have resolved. He feels great and states that the medication has cleared everything up.   He engages in regular physical activity, including walking and attending a fitness class on Tuesdays and Thursdays. He maintains a healthy diet, avoiding excessive fat and salt, and drinks alcohol  only occasionally, taking two days to finish a beer.  He has regular appointments with his eye care provider but does not currently have a dentist.  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  Biochemical relapse in 2017.  Currently he is on Eligard  every 3 months and xtandi  60 mg daily at home.  He has had blood work done with his oncologist, CMP and CBC.  No recent falls, feelings of depression, or cold symptoms. He uses a cane regularly.  Hypertension: Currently he is on metoprolol  titrate 25 mg twice daily. CAD and sick sinus syndrome status post pacemaker placement: Follows with cardiology regularly. He does not monitor BP at home. Negative for unusual or severe headache, visual changes, exertional chest pain, dyspnea,  focal weakness, or worsening edema. He takes furosemide  20 mg daily as needed to help with lower extremity edema.  Negative for orthopnea and  PND.  Lab Results  Component Value Date   CREATININE 0.80 05/17/2024   BUN 15 05/17/2024   NA 138 05/17/2024   K 4.0 05/17/2024   CL 105 05/17/2024   CO2 25 05/17/2024   Chronic cough: He has not experienced any cough recently and therefore has not taken benzonatate  lately. Currently he is on benzonatate  at 100 mg daily as needed and gabapentin  300 mg daily at bedtime, he has been consistent with taking the latter once every day. Gabapentin  also helps him sleep better. No side effects reported. COPD and OSA: Follows with pulmonologist, last seen on 07/18/2024. Hemoglobin A1c has been mildly elevated in the past, no history of diabetes.  Lab Results  Component Value Date   HGBA1C 6.3 04/12/2023   Lab Results  Component Value Date   CHOL 162 04/12/2023   HDL 70.20 04/12/2023   LDLCALC 76 04/12/2023   TRIG 79.0 04/12/2023   CHOLHDL 2 04/12/2023   Lab Results  Component Value Date   ALT 10 05/17/2024   AST 16 05/17/2024   ALKPHOS 91 05/17/2024   BILITOT 0.7 05/17/2024   He also uses tizanidine  4 mg 1/2-1 tab daily as needed for back pain, which was added to his regimen due to previous back pain issues.  Review of Systems  Constitutional:  Negative for activity change, appetite change and fever.  HENT:  Negative for nosebleeds and sore throat.   Eyes:  Negative for redness and visual disturbance.  Respiratory:  Negative for cough, shortness of breath and wheezing.   Cardiovascular:  Negative for chest pain and  palpitations.  Gastrointestinal:  Negative for abdominal pain, blood in stool, nausea and vomiting.  Endocrine: Negative for cold intolerance, heat intolerance, polydipsia, polyphagia and polyuria.  Genitourinary:  Negative for decreased urine volume, dysuria and hematuria.  Musculoskeletal:  Positive for arthralgias and gait problem. Negative for myalgias.  Skin:  Negative for color change and rash.  Allergic/Immunologic: Positive for environmental allergies.   Neurological:  Negative for syncope, weakness and headaches.  Hematological:  Negative for adenopathy. Does not bruise/bleed easily.  Psychiatric/Behavioral:  Negative for confusion and hallucinations.   All other systems reviewed and are negative.  Current Outpatient Medications on File Prior to Visit  Medication Sig Dispense Refill   acetaminophen  (TYLENOL ) 500 MG tablet Take 2 tablets (1,000 mg total) by mouth 3 (three) times daily. 30 tablet 0   albuterol  (VENTOLIN  HFA) 108 (90 Base) MCG/ACT inhaler TAKE 2 PUFFS BY MOUTH EVERY 6 HOURS AS NEEDED FOR WHEEZE OR SHORTNESS OF BREATH 8.5 each 12   aspirin  EC 81 MG tablet Take 81 mg by mouth daily.     atorvastatin  (LIPITOR) 20 MG tablet TAKE 1 TABLET BY MOUTH ONCE  DAILY 100 tablet 2   benzonatate  (TESSALON ) 100 MG capsule TAKE 1 CAPSULE BY MOUTH EVERY DAY AS NEEDED 30 capsule 1   budesonide -formoterol  (SYMBICORT ) 160-4.5 MCG/ACT inhaler 2 puffs then rinse mouth, twice daily 10.2 g 12   citalopram  (CELEXA ) 10 MG tablet TAKE 1 TABLET BY MOUTH DAILY 100 tablet 2   clotrimazole -betamethasone  (LOTRISONE ) cream Apply 1 Application topically 2 (two) times daily as needed. 30 g 2   diclofenac  Sodium (VOLTAREN ) 1 % GEL AAPLY 4 GRAMS TOPICALLY 4 TIMES DAILY (Patient taking differently: Apply 1 Application topically 4 (four) times daily as needed (pain).) 400 g 2   enzalutamide  (XTANDI ) 40 MG tablet Take 4 tablets (160 mg total) by mouth daily. 120 tablet 2   fluticasone  (FLONASE ) 50 MCG/ACT nasal spray PLACE 1 SPRAY INTO BOTH NOSTRILS DAILY AS NEEDED FOR ALLERGIES OR RHINITIS (AT BEDTIME). 48 mL 1   furosemide  (LASIX ) 20 MG tablet TAKE 1 TABLET BY MOUTH DAILY 100 tablet 2   gabapentin  (NEURONTIN ) 300 MG capsule TAKE 1 CAPSULE BY MOUTH EVERYDAY AT BEDTIME 30 capsule 3   Melatonin 10 MG CAPS Take 20 mg by mouth at bedtime.     metoprolol  tartrate (LOPRESSOR ) 25 MG tablet TAKE 1 TABLET BY MOUTH TWICE A DAY 180 tablet 3   Multiple Vitamin (MULTIVITAMIN WITH  MINERALS) TABS tablet Take 0.5 tablets by mouth daily.     nystatin  (MYCOSTATIN /NYSTOP ) powder Apply 1 Application topically 2 (two) times daily. 60 g 3   omeprazole  (PRILOSEC) 20 MG capsule TAKE 1 CAPSULE BY MOUTH DAILY  BEFORE BREAKFAST 100 capsule 2   Spacer/Aero-Holding Chambers DEVI To use with inhalers 1 Device 0   tiZANidine  (ZANAFLEX ) 4 MG tablet Take 0.5-1 tablets (2-4 mg total) by mouth at bedtime. 30 tablet 0   No current facility-administered medications on file prior to visit.    Past Medical History:  Diagnosis Date   Anxiety    Arthritis    COPD (chronic obstructive pulmonary disease) (HCC)    Coronary artery disease    Dyspnea    Gastric outlet obstruction 02/03/2018   thelbert 02/03/2018   GERD (gastroesophageal reflux disease)    History of hiatal hernia    small/notes 02/03/2018   Hypertension    no meds   Presence of permanent cardiac pacemaker    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   Gleason 4+5=9,  PSA 15.65   Radiation    Sinus problem    SSS (sick sinus syndrome) (HCC)    Allergies  Allergen Reactions   Penicillins Rash    Has patient had a PCN reaction causing immediate rash, facial/tongue/throat swelling, SOB or lightheadedness with hypotension: Yes Has patient had a PCN reaction causing severe rash involving mucus membranes or skin necrosis: No Has patient had a PCN reaction that required hospitalization: No Has patient had a PCN reaction occurring within the last 10 years: No If all of the above answers are NO, then may proceed with Cephalosporin use.    Social History   Socioeconomic History   Marital status: Married    Spouse name: Not on file   Number of children: 3   Years of education: Not on file   Highest education level: Not on file  Occupational History   Occupation: Estate agent    Employer: [employer]    Comment: retired   Occupation: production designer, theatre/television/film    Comment: [place]ired  Tobacco Use   Smoking status: Former    Types: Cigars     Quit date: 09/14/2012    Years since quitting: 11.8   Smokeless tobacco: Never   Tobacco comments:    little cigars; the small ones  Vaping Use   Vaping status: Never Used  Substance and Sexual Activity   Alcohol  use: Not Currently    Alcohol /week: 14.0 standard drinks of alcohol     Types: 14 Cans of beer per week    Comment: quit in 2000   Drug use: No   Sexual activity: Not Currently  Other Topics Concern   Not on file  Social History Narrative   Lives with wife and 2 dogs in one level home; daughter and her family co-habitate.   Has three daughters, all in [place], supportive. Five grandchildren.   Wants to return to doing silver sneakers once gym re-opens.         Social Drivers of Corporate Investment Banker Strain: Low Risk  (07/29/2022)   Overall Financial Resource Strain (CARDIA)    Difficulty of Paying Living Expenses: Not hard at all  Recent Concern: Financial Resource Strain - Medium Risk (05/27/2022)   Overall Financial Resource Strain (CARDIA)    Difficulty of Paying Living Expenses: Somewhat hard  Food Insecurity: No Food Insecurity (10/15/2022)   Hunger Vital Sign    Worried About Running Out of Food in the Last Year: Never true    Ran Out of Food in the Last Year: Never true  Transportation Needs: No Transportation Needs (10/15/2022)   PRAPARE - Administrator, Civil Service (Medical): No    Lack of Transportation (Non-Medical): No  Physical Activity: Insufficiently Active (07/29/2022)   Exercise Vital Sign    Days of Exercise per Week: 5 days    Minutes of Exercise per Session: 20 min  Stress: No Stress Concern Present (07/29/2022)   [employer] of Occupational Health - Occupational Stress Questionnaire    Feeling of Stress : Not at all  Social Connections: Socially Integrated (07/29/2022)   Social Connection and Isolation Panel    Frequency of Communication with Friends and Family: More than three times a week    Frequency of Social Gatherings  with Friends and Family: More than three times a week    Attends Religious Services: More than 4 times per year    Active Member of [employer] or Organizations: Yes    Attends Banker Meetings: More  than 4 times per year    Marital Status: Married   Vitals:   07/24/24 0819  BP: 124/76  Pulse: 63  Resp: 17  Temp: 97.9 F (36.6 C)  SpO2: 94%   Body mass index is 32.17 kg/m.  Physical Exam Vitals and nursing note reviewed.  Constitutional:      General: He is not in acute distress.    Appearance: He is well-developed.  HENT:     Head: Normocephalic and atraumatic.     Right Ear: Tympanic membrane, ear canal and external ear normal.     Left Ear: Tympanic membrane, ear canal and external ear normal.     Mouth/Throat:     Mouth: Mucous membranes are moist.     Dentition: Has dentures.     Pharynx: Oropharynx is clear.     Comments: Small ecchymosis right hard palate, could've be caused by his new dentures.  Eyes:     Conjunctiva/sclera: Conjunctivae normal.     Pupils: Pupils are equal, round, and reactive to light.  Neck:     Thyroid : No thyroid  mass.  Cardiovascular:     Rate and Rhythm: Normal rate. Rhythm irregular.     Pulses:          Dorsalis pedis pulses are 2+ on the right side and 2+ on the left side.     Heart sounds: No murmur heard. Pulmonary:     Effort: Pulmonary effort is normal. No respiratory distress.     Breath sounds: Normal breath sounds.  Abdominal:     Palpations: Abdomen is soft. There is no hepatomegaly or mass.     Tenderness: There is no abdominal tenderness.  Musculoskeletal:     Right shoulder: No tenderness. Decreased range of motion.  Lymphadenopathy:     Cervical: No cervical adenopathy.  Skin:    General: Skin is warm.     Findings: Ecchymosis (A few small on forearms and dorsum of hands) present. No erythema or rash.  Neurological:     General: No focal deficit present.     Mental Status: He is alert and oriented to  person, place, and time.     Comments: Unstable gait, assisted with cane.   Psychiatric:        Mood and Affect: Mood and affect normal.    ASSESSMENT AND PLAN:  Tony [name] was seen today for medical management of chronic issues and annual exam.  Diagnoses and all orders for this visit: Orders Placed This Encounter  Procedures   Flu vaccine HIGH DOSE PF(Fluzone Trivalent)   Lipid panel   Hemoglobin A1c   Lab Results  Component Value Date   HGBA1C 6.4 07/24/2024   Lab Results  Component Value Date   CHOL 154 07/24/2024   HDL 62.80 07/24/2024   LDLCALC 74 07/24/2024   TRIG 87.0 07/24/2024   CHOLHDL 2 07/24/2024   Routine general medical examination at a health care facility We discussed the importance of regular physical activity and healthy diet for prevention of chronic illness and/or complications. Preventive guidelines reviewed. Vaccination up-to-date. Fall precautions discussed. Next CPE in a year.  Chronic cough Assessment & Plan: He has not had episodes recently. Continue benzonatate  100 mg daily as needed and gabapentin  300 mg daily at bedtime. He also follows with pulmonologist for COPD.  Prediabetes Assessment & Plan: Encouraged consistency with a healthy lifestyle for diabetes prevention. Last hemoglobin A1c 6.3 in 03/2023. Further recommendation will be given according to lipid panel result.  Orders: -     Hemoglobin A1c; Future  Hypercholesterolemia Assessment & Plan: Currently on atorvastatin  20 mg daily. Last LDL 76 in 03/2023. Further recommendation will be given according to lipid panel result.  Orders: -     Lipid panel; Future  Anxiety disorder, unspecified type Assessment & Plan: Problem is well-controlled. Continue Celexa  10 mg daily. He is also on gabapentin  300 mg at bedtime to help with cough and sleep. Follow-up in 6 months, before if needed.   Essential hypertension, benign Assessment & Plan: BP adequately  controlled. Currently he is on metoprolol  titrate 25 mg 1/2 tablet twice daily on low salt diet. Eye exam is current.   Need for immunization against influenza -     Flu vaccine HIGH DOSE PF(Fluzone Trivalent)  Return in 6 months (on 01/21/2025) for chronic problems.  Schae Cando G. Jasiyah Paulding, MD  [facility]. [place] office.

## 2024-07-24 NOTE — Assessment & Plan Note (Signed)
 Problem is well-controlled. Continue Celexa  10 mg daily. He is also on gabapentin  300 mg at bedtime to help with cough and sleep. Follow-up in 6 months, before if needed.

## 2024-07-24 NOTE — Assessment & Plan Note (Signed)
 He has not had episodes recently. Continue benzonatate  100 mg daily as needed and gabapentin  300 mg daily at bedtime. He also follows with pulmonologist for COPD.

## 2024-07-24 NOTE — Assessment & Plan Note (Signed)
 Currently on atorvastatin  20 mg daily. Last LDL 76 in 03/2023. Further recommendation will be given according to lipid panel result.

## 2024-07-25 ENCOUNTER — Ambulatory Visit: Payer: Medicare Other | Admitting: Internal Medicine

## 2024-07-26 ENCOUNTER — Ambulatory Visit

## 2024-08-03 ENCOUNTER — Other Ambulatory Visit: Payer: Self-pay

## 2024-08-07 ENCOUNTER — Other Ambulatory Visit: Payer: Self-pay

## 2024-08-07 ENCOUNTER — Other Ambulatory Visit (HOSPITAL_COMMUNITY): Payer: Self-pay

## 2024-08-07 NOTE — Progress Notes (Signed)
 Specialty Pharmacy Refill Coordination Note  Spoke with Laprade,[name] (Wife)  Zyden Suman Calais is a 83 y.o. male contacted today regarding refills of specialty medication(s) Enzalutamide  (XTANDI )  Doses on hand: Around 7 days (28 tabs)  Patient requested: Delivery   Delivery date: 08/09/24   Verified address: [address] [place] [address]  Medication will be filled on 08/09/24

## 2024-08-08 ENCOUNTER — Other Ambulatory Visit: Payer: Self-pay

## 2024-08-15 ENCOUNTER — Other Ambulatory Visit: Payer: Self-pay | Admitting: Nurse Practitioner

## 2024-08-15 DIAGNOSIS — C61 Malignant neoplasm of prostate: Secondary | ICD-10-CM

## 2024-08-15 NOTE — Progress Notes (Unsigned)
 Patient Care Team: [name], [name], MD as PCP - General (Family Medicine) Croitoru, Jerel, MD as PCP - Cardiology (Cardiology) Liane Sharyne MATSU, [facility] (Inactive) as Pharmacist (Pharmacist) Lanny Callander, MD as Attending Physician (Hematology and Oncology)  Clinic Day:  08/16/2024  Referring physician: [name], [name], MD  ASSESSMENT & PLAN:   Assessment & Plan: Malignant neoplasm of prostate [facility]) -initially [id number] diagnosed in 2015, with Gleason score 4+5 = 9 and a PSA 15.7  -Status post radiation therapy with external beam and seed implant brachytherapy boost completed in October 2015 -He received 1 year androgen deprivation in 2016. -Due to biochemical relapse in 2017, he re-started ADT -Zytiga  with prednisone  started in August 2017, discontinued in October 2023 because of insurance coverage. -He was switched to Xtandi  160 mg daily in October 2023 -He has responded very well so far, PSA undetectable. -Last CT scan in May and December 2023 showed no evidence of metastasis. - 08/16/2024 -she continues to do well with Xtandi  along with Eligard  every 3 months (22.5 mg).  Continue with current treatment.   Plan Labs reviewed. - Unremarkable CBC and CMP. - PSA <0.02. Labs and patient presentation are appropriate for Eligard  injection today.  Will proceed as scheduled. Continue Xtandi  as prescribed. Continue with labs, follow-up, and Eligard  injections every 3 months.  The patient understands the plans discussed today and is in agreement with them.  He knows to contact our office if he develops concerns prior to his next appointment.  I provided 20 minutes of face-to-face time during this encounter and > 50% was spent counseling as documented under my assessment and plan.    Tony FORBES Lessen, NP  [facility] - A DEPT [facility] [address] [place] [address] Dept: [phone] Dept Fax: [phone]    Orders Placed This Encounter  Procedures   PSA (For [facility])    Standing Status:   Future    Number of Occurrences:   1    Expected Date:   08/16/2024    Expiration Date:   08/16/2025      CHIEF COMPLAINT:  CC: f/u prostate cancer   Current Treatment:  Eligard  22.5 mg every 3 months; Xtandi  160 mg daily  INTERVAL HISTORY:  Tony [name] is here today for repeat clinical assessment. He last saw Dr. Lanny on 05/17/2024.  He reports feeling well in general.  Has no concerns or complaints.  He denies chest pain, chest pressure, or shortness of breath. He denies headaches or visual disturbances. He denies abdominal pain, nausea, vomiting, or changes in bowel or bladder habits.  He denies fevers or chills. He denies pain. His appetite is good.  He does report some increased gas and bloating.  His weight has been stable.  I have reviewed the past medical history, past surgical history, social history and family history with the patient and they are unchanged from previous note.  ALLERGIES:  is allergic to penicillins.  MEDICATIONS:  Current Outpatient Medications  Medication Sig Dispense Refill   acetaminophen  (TYLENOL ) 500 MG tablet Take 2 tablets (1,000 mg total) by mouth 3 (three) times daily. 30 tablet 0   albuterol  (VENTOLIN  HFA) 108 (90 Base) MCG/ACT inhaler TAKE 2 PUFFS BY MOUTH EVERY 6 HOURS AS NEEDED FOR WHEEZE OR SHORTNESS OF BREATH 8.5 each 12   aspirin  EC 81 MG tablet Take 81 mg by mouth daily.     atorvastatin  (LIPITOR) 20 MG tablet  TAKE 1 TABLET BY MOUTH ONCE  DAILY 100 tablet 2   benzonatate  (TESSALON ) 100 MG capsule TAKE 1 CAPSULE BY MOUTH EVERY DAY AS NEEDED 30 capsule 1   budesonide -formoterol  (SYMBICORT ) 160-4.5 MCG/ACT inhaler 2 puffs then rinse mouth, twice daily 10.2 g 12   citalopram  (CELEXA ) 10 MG tablet TAKE 1 TABLET BY MOUTH DAILY 100 tablet 2   clotrimazole -betamethasone  (LOTRISONE ) cream Apply 1 Application topically 2 (two) times daily as needed. 30 g 2   diclofenac   Sodium (VOLTAREN ) 1 % GEL AAPLY 4 GRAMS TOPICALLY 4 TIMES DAILY (Patient taking differently: Apply 1 Application topically 4 (four) times daily as needed (pain).) 400 g 2   enzalutamide  (XTANDI ) 40 MG tablet Take 4 tablets (160 mg total) by mouth daily. 120 tablet 2   fluticasone  (FLONASE ) 50 MCG/ACT nasal spray PLACE 1 SPRAY INTO BOTH NOSTRILS DAILY AS NEEDED FOR ALLERGIES OR RHINITIS (AT BEDTIME). 48 mL 1   gabapentin  (NEURONTIN ) 300 MG capsule TAKE 1 CAPSULE BY MOUTH EVERYDAY AT BEDTIME 30 capsule 3   Melatonin 10 MG CAPS Take 20 mg by mouth at bedtime.     metoprolol  tartrate (LOPRESSOR ) 25 MG tablet TAKE 1 TABLET BY MOUTH TWICE  DAILY 180 tablet 0   Multiple Vitamin (MULTIVITAMIN WITH MINERALS) TABS tablet Take 0.5 tablets by mouth daily.     nystatin  (MYCOSTATIN /NYSTOP ) powder Apply 1 Application topically 2 (two) times daily. 60 g 3   omeprazole  (PRILOSEC) 20 MG capsule TAKE 1 CAPSULE BY MOUTH DAILY  BEFORE BREAKFAST 100 capsule 2   Spacer/Aero-Holding Chambers DEVI To use with inhalers 1 Device 0   tiZANidine  (ZANAFLEX ) 4 MG tablet Take 0.5-1 tablets (2-4 mg total) by mouth at bedtime. 30 tablet 0   furosemide  (LASIX ) 20 MG tablet Take 20 mg daily. You may take an extra 20 mg daily as needed for swelling. When you take an extra dose of Furosemide , you will need to take potassium 20 meq. 160 tablet 3   potassium chloride  SA (KLOR-CON  M) 20 MEQ tablet Take 1 tablet (20 mEq total) by mouth daily as needed (take only on the days that you take an extra dose of Furosemide ). 90 tablet 3   No current facility-administered medications for this visit.    HISTORY OF PRESENT ILLNESS:   Oncology History  Malignant neoplasm of prostate (HCC)  02/23/2014 Initial Diagnosis   Malignant neoplasm of prostate       REVIEW OF SYSTEMS:   Constitutional: Denies fevers, chills or abnormal weight loss Eyes: Denies blurriness of vision Ears, nose, mouth, throat, and face: Denies mucositis or sore  throat Respiratory: Denies cough, dyspnea or wheezes Cardiovascular: Denies palpitation, chest discomfort or lower extremity swelling Gastrointestinal:  Denies nausea, heartburn or change in bowel habits Skin: Denies abnormal skin rashes Lymphatics: Denies new lymphadenopathy or easy bruising Neurological:Denies numbness, tingling or new weaknesses Behavioral/Psych: Mood is stable, no new changes  All other systems were reviewed with the patient and are negative.   VITALS:   Today's Vitals   08/16/24 0930 08/16/24 0931  BP: 136/86   Pulse: 84   Resp: 17   Temp: 97.7 F (36.5 C)   SpO2: 97%   Weight: 219 lb 3.2 oz (99.4 kg)   PainSc:  0-No pain   Body mass index is 33.33 kg/m.   Wt Readings from Last 3 Encounters:  08/18/24 217 lb 12.8 oz (98.8 kg)  08/16/24 219 lb 3.2 oz (99.4 kg)  07/24/24 211 lb 9.6 oz (96 kg)  Body mass index is 33.33 kg/m.  Performance status (ECOG): 1 - Symptomatic but completely ambulatory  PHYSICAL EXAM:   GENERAL:alert, no distress and comfortable SKIN: skin color, texture, turgor are normal, no rashes or significant lesions EYES: normal, Conjunctiva are pink and non-injected, sclera clear OROPHARYNX:no exudate, no erythema and lips, buccal mucosa, and tongue normal  NECK: supple, thyroid  normal size, non-tender, without nodularity LYMPH:  no palpable lymphadenopathy in the cervical, axillary or inguinal LUNGS: clear to auscultation and percussion with normal breathing effort HEART: regular rate & rhythm and no murmurs and no lower extremity edema ABDOMEN:abdomen soft, non-tender and normal bowel sounds Musculoskeletal:no cyanosis of digits and no clubbing  NEURO: alert & oriented x 3 with fluent speech, no focal motor/sensory deficits  LABORATORY DATA:  I have reviewed the data as listed    Component Value Date/Time   NA 137 08/16/2024 0904   NA 140 09/09/2023 1026   NA 141 08/27/2017 0915   K 4.8 08/16/2024 0904   K 4.8  08/27/2017 0915   CL 103 08/16/2024 0904   CO2 21 (L) 08/16/2024 0904   CO2 26 08/27/2017 0915   GLUCOSE 119 (H) 08/16/2024 0904   GLUCOSE 108 08/27/2017 0915   BUN 16 08/16/2024 0904   BUN 17 09/09/2023 1026   BUN 16.2 08/27/2017 0915   CREATININE 0.89 08/16/2024 0904   CREATININE 1.07 07/31/2020 1325   CREATININE 0.9 08/27/2017 0915   CALCIUM  9.3 08/16/2024 0904   CALCIUM  9.8 08/27/2017 0915   PROT 7.4 08/16/2024 0904   PROT 7.3 08/27/2017 0915   ALBUMIN 4.5 08/16/2024 0904   ALBUMIN 4.1 08/27/2017 0915   AST 25 08/16/2024 0904   AST 23 08/27/2017 0915   ALT 14 08/16/2024 0904   ALT 17 08/27/2017 0915   ALKPHOS 106 08/16/2024 0904   ALKPHOS 83 08/27/2017 0915   BILITOT 0.6 08/16/2024 0904   BILITOT 0.95 08/27/2017 0915   GFRNONAA >60 08/16/2024 0904   GFRNONAA 66 07/31/2020 1325   GFRAA 77 07/31/2020 1325    Lab Results  Component Value Date   WBC 6.9 08/16/2024   NEUTROABS 4.3 08/16/2024   HGB 13.2 08/16/2024   HCT 40.4 08/16/2024   MCV 86.9 08/16/2024   PLT 247 08/16/2024

## 2024-08-15 NOTE — Assessment & Plan Note (Addendum)
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 -Due to biochemical relapse in 2017, he re-started ADT -Zytiga  with prednisone  started in August 2017, discontinued in October 2023 because of insurance coverage. -He was switched to Xtandi  160 mg daily in October 2023 -He has responded very well so far, PSA undetectable. -Last CT scan in May and December 2023 showed no evidence of metastasis. - 08/16/2024 -she continues to do well with Xtandi  along with Eligard  every 3 months (22.5 mg).  Continue with current treatment.

## 2024-08-16 ENCOUNTER — Inpatient Hospital Stay

## 2024-08-16 ENCOUNTER — Inpatient Hospital Stay: Attending: Hematology

## 2024-08-16 ENCOUNTER — Inpatient Hospital Stay: Admitting: Nurse Practitioner

## 2024-08-16 VITALS — BP 136/86 | HR 84 | Temp 97.7°F | Resp 17 | Wt 219.2 lb

## 2024-08-16 DIAGNOSIS — C61 Malignant neoplasm of prostate: Secondary | ICD-10-CM | POA: Insufficient documentation

## 2024-08-16 DIAGNOSIS — Z5111 Encounter for antineoplastic chemotherapy: Secondary | ICD-10-CM | POA: Insufficient documentation

## 2024-08-16 DIAGNOSIS — E669 Obesity, unspecified: Secondary | ICD-10-CM

## 2024-08-16 LAB — CBC WITH DIFFERENTIAL (CANCER CENTER ONLY)
Abs Immature Granulocytes: 0.04 K/uL (ref 0.00–0.07)
Basophils Absolute: 0.1 K/uL (ref 0.0–0.1)
Basophils Relative: 1 %
Eosinophils Absolute: 0.4 K/uL (ref 0.0–0.5)
Eosinophils Relative: 6 %
HCT: 40.4 % (ref 39.0–52.0)
Hemoglobin: 13.2 g/dL (ref 13.0–17.0)
Immature Granulocytes: 1 %
Lymphocytes Relative: 19 %
Lymphs Abs: 1.3 K/uL (ref 0.7–4.0)
MCH: 28.4 pg (ref 26.0–34.0)
MCHC: 32.7 g/dL (ref 30.0–36.0)
MCV: 86.9 fL (ref 80.0–100.0)
Monocytes Absolute: 0.8 K/uL (ref 0.1–1.0)
Monocytes Relative: 12 %
Neutro Abs: 4.3 K/uL (ref 1.7–7.7)
Neutrophils Relative %: 61 %
Platelet Count: 247 K/uL (ref 150–400)
RBC: 4.65 MIL/uL (ref 4.22–5.81)
RDW: 16.1 % — ABNORMAL HIGH (ref 11.5–15.5)
WBC Count: 6.9 K/uL (ref 4.0–10.5)
nRBC: 0 % (ref 0.0–0.2)

## 2024-08-16 LAB — CMP (CANCER CENTER ONLY)
ALT: 14 U/L (ref 0–44)
AST: 25 U/L (ref 15–41)
Albumin: 4.5 g/dL (ref 3.5–5.0)
Alkaline Phosphatase: 106 U/L (ref 38–126)
Anion gap: 13 (ref 5–15)
BUN: 16 mg/dL (ref 8–23)
CO2: 21 mmol/L — ABNORMAL LOW (ref 22–32)
Calcium: 9.3 mg/dL (ref 8.9–10.3)
Chloride: 103 mmol/L (ref 98–111)
Creatinine: 0.89 mg/dL (ref 0.61–1.24)
GFR, Estimated: >60 mL/min (ref >60)
Glucose, Bld: 119 mg/dL — ABNORMAL HIGH (ref 70–99)
Potassium: 4.8 mmol/L (ref 3.5–5.1)
Sodium: 137 mmol/L (ref 135–145)
Total Bilirubin: 0.6 mg/dL (ref 0.0–1.2)
Total Protein: 7.4 g/dL (ref 6.5–8.1)

## 2024-08-16 LAB — PSA: Prostatic Specific Antigen: <0.02 ng/mL (ref 0.00–4.00)

## 2024-08-16 MED ORDER — LEUPROLIDE ACETATE (3 MONTH) 22.5 MG ~~LOC~~ KIT
22.5000 mg | PACK | Freq: Once | SUBCUTANEOUS | Status: AC
  Administered 2024-08-16: 22.5 mg via SUBCUTANEOUS
  Filled 2024-08-16: qty 22.5

## 2024-08-18 ENCOUNTER — Encounter: Payer: Self-pay | Admitting: Cardiovascular Disease

## 2024-08-18 ENCOUNTER — Encounter: Payer: Self-pay | Admitting: Family Medicine

## 2024-08-18 ENCOUNTER — Ambulatory Visit: Attending: Cardiovascular Disease | Admitting: Cardiovascular Disease

## 2024-08-18 VITALS — BP 128/80 | HR 69 | Ht 68.0 in | Wt 217.8 lb

## 2024-08-18 DIAGNOSIS — I714 Abdominal aortic aneurysm, without rupture, unspecified: Secondary | ICD-10-CM | POA: Diagnosis not present

## 2024-08-18 DIAGNOSIS — I7121 Aneurysm of the ascending aorta, without rupture: Secondary | ICD-10-CM | POA: Diagnosis not present

## 2024-08-18 DIAGNOSIS — E7800 Pure hypercholesterolemia, unspecified: Secondary | ICD-10-CM

## 2024-08-18 DIAGNOSIS — I2510 Atherosclerotic heart disease of native coronary artery without angina pectoris: Secondary | ICD-10-CM

## 2024-08-18 DIAGNOSIS — I10 Essential (primary) hypertension: Secondary | ICD-10-CM

## 2024-08-18 DIAGNOSIS — C61 Malignant neoplasm of prostate: Secondary | ICD-10-CM

## 2024-08-18 DIAGNOSIS — Z950 Presence of cardiac pacemaker: Secondary | ICD-10-CM

## 2024-08-18 DIAGNOSIS — I453 Trifascicular block: Secondary | ICD-10-CM

## 2024-08-18 DIAGNOSIS — G4733 Obstructive sleep apnea (adult) (pediatric): Secondary | ICD-10-CM

## 2024-08-18 DIAGNOSIS — R600 Localized edema: Secondary | ICD-10-CM

## 2024-08-18 DIAGNOSIS — R7303 Prediabetes: Secondary | ICD-10-CM

## 2024-08-18 DIAGNOSIS — I495 Sick sinus syndrome: Secondary | ICD-10-CM | POA: Diagnosis not present

## 2024-08-18 MED ORDER — POTASSIUM CHLORIDE CRYS ER 20 MEQ PO TBCR: 20.0000 meq | EXTENDED_RELEASE_TABLET | Freq: Every day | ORAL | 3 refills | Status: AC | PRN

## 2024-08-18 MED ORDER — FUROSEMIDE 20 MG PO TABS: ORAL_TABLET | ORAL | 3 refills | Status: AC

## 2024-08-18 NOTE — Patient Instructions (Addendum)
 Medication Instructions:  You may take an extra 20 mg of Furosemide  a day for increased swelling If you take an extra dose of Furosemide , you will need to take Potassium 20 meq *If you need a refill on your cardiac medications before your next appointment, please call your pharmacy*  Lab Work: None ordered If you have labs (blood work) drawn today and your tests are completely normal, you will receive your results only by: MyChart Message (if you have MyChart) OR A paper copy in the mail If you have any lab test that is abnormal or we need to change your treatment, we will call you to review the results.  Testing/Procedures: CTA chest and CTA abdomen/pelvis ordered Non-Cardiac CT Angiography (CTA), is a special type of CT scan that uses a computer to produce multi-dimensional views of major blood vessels throughout the body. In CT angiography, a contrast material is injected through an IV to help visualize the blood vessels   Follow-Up: At [facility], you and your health needs are our priority.  As part of our continuing mission to provide you with exceptional heart care, our providers are all part of one team.  This team includes your primary Cardiologist (physician) and Advanced Practice Providers or APPs (Physician Assistants and Nurse Practitioners) who all work together to provide you with the care you need, when you need it.  Your next appointment:   1 year(s)  Provider:   Jerel Balding, MD    We recommend signing up for the patient portal called MyChart.  Sign up information is provided on this After Visit Summary.  MyChart is used to connect with patients for Virtual Visits (Telemedicine).  Patients are able to view lab/test results, encounter notes, upcoming appointments, etc.  Non-urgent messages can be sent to your provider as well.   To learn more about what you can do with MyChart, go to [email or web address].

## 2024-08-18 NOTE — Progress Notes (Signed)
 Cardiology OfficeNote    Date:  08/20/2024   ID:  Tony [name], DOB [birth year], MRN [id number]  PCP:  [name], [name], MD  Cardiologist:   Jerel Balding, MD    Chief Complaint  Patient presents with   Pacemaker Check    History of Present Illness:  Tony [name] is a 83 y.o. 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 He received a dual-chamber permanent pacemaker for sinus bradycardia with chronotropic incompetence in October 2019 [employer] Azure), OSA, chronic respiratory failure with hypoxia following COVID-19 infection in April 2023.  He has had an uneventful year from a medical point of view.  He has edema of the lower extremity and prominent varicose veins.  He also takes furosemide  but this does not resolve the edema.  He otherwise denies chest pain or shortness of breath either at rest or with activity, orthopnea, PND, palpitations, dizziness, syncope, claudication, weight changes.  He has not had any falls or serious bleeding problems.  He is cautious when walking due to an unsteady gait.  He asked whether he can resume driving (in the background his wife shakes her head that he should not).  Pacemaker interrogation shows normal device function.  His device was implanted in 2019 and has roughly 8 years remaining longevity, all lead parameters are stable.  Presenting rhythm is atrial sensed, ventricular sensed and obviously he is not pacemaker dependent.  He has 67% atrial pacing and 0.1% ventricular pacing.  There has been no evidence of atrial fibrillation and in the last 3 months he had only 3 very brief episodes of nonsustained VT that actually represent atrial tachycardia with 1: 1 AV conduction, maximum 7 beats.  Activity level is roughly 2 hours/day which is constant.  Most recent evaluation  for his aortic aneurysms was CT angiography in February 2025.  The ascending aortic aneurysm is essentially unchanged (maximum diameter proximally 4.6 cm, distally 4.7 cm, compared to 4.6 cm in 2023 and 4.5 cm in 2022).  The abdominal aortic aneurysm is also unchanged in size at 4.2 cm in maximum diameter.  Metabolic control is fair with hemoglobin A1c of 6.4%, LDL 64, HDL 62, normal triglycerides.  He has normal renal function with creatinine 0.89.  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  His oncologist is Dr. Amadeo.  His PSA remains undetectable  He has incidentally discovered coronary artery calcification on chest CT.  In September 2017 his nuclear vasodilator stress test was a low risk study with a small/mild area of ischemia in the apex. EF 59%.   Past Medical History:  Diagnosis Date   Anxiety    Arthritis    COPD (chronic obstructive pulmonary disease) (HCC)    Coronary artery disease    Dyspnea    Gastric outlet obstruction 02/03/2018   thelbert 02/03/2018   GERD (gastroesophageal reflux disease)    History of hiatal hernia    small/notes 02/03/2018   Hypertension    no meds   Presence of permanent cardiac pacemaker    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   Gleason 4+5=9, PSA 15.65   Radiation    Sinus problem    SSS (sick sinus syndrome) [facility])     Past Surgical History:  Procedure Laterality Date   APPENDECTOMY  2019   hx hematoma s/p appendectomy  CHOLECYSTECTOMY N/A 05/15/2022   Procedure: LAPAROSCOPIC CHOLECYSTECTOMY;  Surgeon: Dasie Leonor CROME, MD;  Location: [facility] ORS;  Service: General;  Laterality: N/A;   ESOPHAGOGASTRODUODENOSCOPY (EGD) WITH PROPOFOL  N/A 02/03/2018   Procedure: ESOPHAGOGASTRODUODENOSCOPY (EGD) WITH PROPOFOL ;  Surgeon: Rosalie Kitchens, MD;  Location: [facility] ENDOSCOPY;  Service: Endoscopy;  Laterality: N/A;   FRACTURE SURGERY     INCISIONAL HERNIA REPAIR N/A 10/14/2022   Procedure:  INCISIONAL HERNIA REPAIR WITH MESH;  Surgeon: Dasie Leonor CROME, MD;  Location: [facility] ORS;  Service: General;  Laterality: N/A;   INSERT / REPLACE / REMOVE PACEMAKER  06/14/2018   LAPAROSCOPIC APPENDECTOMY N/A 01/21/2018   Procedure: APPENDECTOMY LAPAROSCOPIC;  Surgeon: Stevie Herlene Righter, MD;  Location: [facility] OR;  Service: General;  Laterality: N/A;   PACEMAKER IMPLANT N/A 06/14/2018   Procedure: PACEMAKER IMPLANT - Dual Chamber;  Surgeon: Francyne Headland, MD;  Location: [facility] INVASIVE CV LAB;  Service: Cardiovascular;  Laterality: N/A;   PROSTATE BIOPSY  02/08/14   gleason 4+5=9, 12/12 cores positive, 54 gm   RADIOACTIVE SEED IMPLANT N/A 06/29/2014   Procedure: RADIOACTIVE SEED IMPLANT;  Surgeon: Alm GORMAN Fragmin, MD;  Location: [facility];  Service: Urology;  Laterality: N/A;   WRIST FRACTURE SURGERY Right 1980s    Current Medications: Outpatient Medications Prior to Visit  Medication Sig Dispense Refill   acetaminophen  (TYLENOL ) 500 MG tablet Take 2 tablets (1,000 mg total) by mouth 3 (three) times daily. 30 tablet 0   albuterol  (VENTOLIN  HFA) 108 (90 Base) MCG/ACT inhaler TAKE 2 PUFFS BY MOUTH EVERY 6 HOURS AS NEEDED FOR WHEEZE OR SHORTNESS OF BREATH 8.5 each 12   aspirin  EC 81 MG tablet Take 81 mg by mouth daily.     atorvastatin  (LIPITOR) 20 MG tablet TAKE 1 TABLET BY MOUTH ONCE  DAILY 100 tablet 2   benzonatate  (TESSALON ) 100 MG capsule TAKE 1 CAPSULE BY MOUTH EVERY DAY AS NEEDED 30 capsule 1   budesonide -formoterol  (SYMBICORT ) 160-4.5 MCG/ACT inhaler 2 puffs then rinse mouth, twice daily 10.2 g 12   citalopram  (CELEXA ) 10 MG tablet TAKE 1 TABLET BY MOUTH DAILY 100 tablet 2   clotrimazole -betamethasone  (LOTRISONE ) cream Apply 1 Application topically 2 (two) times daily as needed. 30 g 2   diclofenac  Sodium (VOLTAREN ) 1 % GEL AAPLY 4 GRAMS TOPICALLY 4 TIMES DAILY (Patient taking differently: Apply 1 Application topically 4 (four) times daily as needed (pain).) 400 g 2    enzalutamide  (XTANDI ) 40 MG tablet Take 4 tablets (160 mg total) by mouth daily. 120 tablet 2   fluticasone  (FLONASE ) 50 MCG/ACT nasal spray PLACE 1 SPRAY INTO BOTH NOSTRILS DAILY AS NEEDED FOR ALLERGIES OR RHINITIS (AT BEDTIME). 48 mL 1   gabapentin  (NEURONTIN ) 300 MG capsule TAKE 1 CAPSULE BY MOUTH EVERYDAY AT BEDTIME 30 capsule 3   Melatonin 10 MG CAPS Take 20 mg by mouth at bedtime.     metoprolol  tartrate (LOPRESSOR ) 25 MG tablet TAKE 1 TABLET BY MOUTH TWICE  DAILY 180 tablet 0   Multiple Vitamin (MULTIVITAMIN WITH MINERALS) TABS tablet Take 0.5 tablets by mouth daily.     nystatin  (MYCOSTATIN /NYSTOP ) powder Apply 1 Application topically 2 (two) times daily. 60 g 3   omeprazole  (PRILOSEC) 20 MG capsule TAKE 1 CAPSULE BY MOUTH DAILY  BEFORE BREAKFAST 100 capsule 2   Spacer/Aero-Holding Chambers DEVI To use with inhalers 1 Device 0   tiZANidine  (ZANAFLEX ) 4 MG tablet Take 0.5-1 tablets (2-4 mg total) by mouth at bedtime. 30 tablet 0   furosemide  (LASIX )  20 MG tablet TAKE 1 TABLET BY MOUTH DAILY 100 tablet 2   No facility-administered medications prior to visit.     Allergies:   Penicillins   Family History:  The patient's family history includes Alzheimer's disease in his father and mother; Arthritis in his sister; Lung cancer in his brother; Prostate cancer in his brother, brother, father, and paternal grandfather; Stomach cancer in his brother.   ROS:   Please see the history of present illness.    ROS All other systems are reviewed and are negative.  PHYSICAL EXAM:   VS:  BP 128/80 (BP Location: Left Arm, Patient Position: Sitting, Cuff Size: Normal)   Pulse 69   Ht 5' 8 (1.727 m)   Wt 217 lb 12.8 oz (98.8 kg)   SpO2 96%   BMI 33.12 kg/m       General: Alert, oriented x3, no distress, moderately obese Head: no evidence of trauma, PERRL, EOMI, no exophtalmos or lid lag, no myxedema, no xanthelasma; normal ears, nose and oropharynx Neck: normal jugular venous pulsations and  no hepatojugular reflux; brisk carotid pulses without delay and no carotid bruits Chest: clear to auscultation, no signs of consolidation by percussion or palpation, normal fremitus, symmetrical and full respiratory excursions Cardiovascular: normal position and quality of the apical impulse, regular rhythm, normal first and second heart sounds, no murmurs, rubs or gallops Abdomen: no tenderness or distention, no masses by palpation, no abnormal pulsatility or arterial bruits, normal bowel sounds, no hepatosplenomegaly Extremities: no clubbing, cyanosis or edema; 2+ radial, ulnar and brachial pulses bilaterally; 2+ right femoral, posterior tibial and dorsalis pedis pulses; 2+ left femoral, posterior tibial and dorsalis pedis pulses; no subclavian or femoral bruits Neurological: grossly nonfocal Psych: Normal mood and affect    Wt Readings from Last 3 Encounters:  08/18/24 217 lb 12.8 oz (98.8 kg)  08/16/24 219 lb 3.2 oz (99.4 kg)  07/24/24 211 lb 9.6 oz (96 kg)      Studies/Labs Reviewed:   10/27/2023  CT angiography of the chest abdomen pelvis (see above)   ECHO 07/14/2021:  1. Left ventricular ejection fraction, by estimation, is 60 to 65%. The  left ventricle has normal function. The left ventricle has no regional  wall motion abnormalities. Left ventricular diastolic parameters were  normal.   2. Right ventricular systolic function is normal. The right ventricular  size is normal.   3. The mitral valve is normal in structure. No evidence of mitral valve  regurgitation. No evidence of mitral stenosis.   4. The aortic valve is calcified. Aortic valve regurgitation is trivial.  Mild aortic valve sclerosis is present, with no evidence of aortic valve  stenosis.   5. Aortic dilatation noted. There is mild dilatation of the aortic root,  measuring 40 mm. There is mild dilatation of the ascending aorta,  measuring 43 mm.   6. The inferior vena cava is normal in size with greater than  50%  respiratory variability, suggesting right atrial pressure of 3 mmHg.   EKG:    EKG Interpretation Date/Time:  Friday August 18 2024 10:31:44 EST Ventricular Rate:  69 PR Interval:  252 QRS Duration:  142 QT Interval:  480 QTC Calculation: 514 R Axis:   -22  Text Interpretation: Atrial-paced rhythm with prolonged AV conduction with Premature supraventricular complexes Right bundle branch block When compared with ECG of 01-Nov-2023 18:28, No significant change since last tracing Confirmed by Kathyrn Warmuth ([id number]) on 08/18/2024 10:59:26 AM  Recent Labs: 08/16/2024: ALT 14; BUN 16; Creatinine 0.89; Hemoglobin 13.2; Platelet Count 247; Potassium 4.8; Sodium 137   Lipid Panel    Component Value Date/Time   CHOL 154 07/24/2024 0849   TRIG 87.0 07/24/2024 0849   HDL 62.80 07/24/2024 0849   CHOLHDL 2 07/24/2024 0849   VLDL 17.4 07/24/2024 0849   LDLCALC 74 07/24/2024 0849   LDLCALC 71 05/21/2020 1007   ASSESSMENT:    1. Aneurysm of ascending aorta without rupture   2. Abdominal aortic aneurysm (AAA) without rupture, unspecified part   3. SSS (sick sinus syndrome) (HCC)   4. Pacemaker   5. Trifascicular block   6. Coronary artery disease involving native coronary artery of native heart without angina pectoris   7. Hypercholesterolemia   8. Prediabetes   9. Essential hypertension, benign   10. Malignant neoplasm of prostate (HCC)   11. OSA (obstructive sleep apnea)   12. Bilateral lower extremity edema      PLAN:  In order of problems listed above:  Ascending aortic aneurysm/AAA: Both his ascending aortic and abdominal aortic aneurysms have been stable in size.  The older he gets the less likely will be that we will recommend preventive surgery for his ascending aortic aneurysm.  On the other hand the abdominal aortic aneurysm could be repaired less invasively with EVAR.  Starting next year I suggest we follow-up his AAA with ultrasound and we may decrease the  frequency of follow-up for the ascending aortic aneurysm to CT angiography every 3 years. SSS: Current sensor settings provide adequate rate response based on the heart rate histograms. PPM: Normal device function, continue modalities every 3 months. RBBB+LAFB+long PR: Almost never requires ventricular pacing.  May progress to high-grade AV block. CAD: Despite the presence of coronary atherosclerosis he has never had angina.  In 2017 his nuclear stress test was a low restudy but did show a small area of ischemia.  The only antianginal medication he takes is metoprolol .  No indication for invasive angiography at this time. HLP: LDL very close to target, HDL is excellent.  Continue same medications. PreDM: Unchanged hemoglobin A1c in the mid 6% range without medications.  Encouraged him to avoid sugars and starches. HTN: In target range.  Continue metoprolol . Prostate Ca S/P XRT: Remarkable response to treatment with Zytiga  and then Xtandi , considering the extent of his disease.  Currently remains on Xtandi  alone. OSA: Followed by Dr. Neysa who will be retiring soon.  He reports compliance with CPAP and denies daytime hypersomnolence. Lower extremity edema: Appears largely to be due to peripheral venous insufficiency.  Cannot exclude a component of right heart failure from obstructive sleep apnea, but he does not have symptoms of daytime hypersomnolence or other signs of right heart failure.  Advised him to keep his legs elevated as much as possible, wear compression stockings and he can titrate the dose of furosemide  if the edema becomes severe.  If he doubles the dose of furosemide  he should also take a potassium supplement on that day. Neuropathy: Unchanged symptoms.  Symptoms distribution is not consistent with carpal tunnel syndrome.  No history of cervical spine disease.  Diabetes has not been poorly controlled to have caused neuropathy.  Check vitamin B12.  Not sure that his previous chemotherapy  could be a cause.   Medication Adjustments/Labs and Tests Ordered: Current medicines are reviewed at length with the patient today.  Concerns regarding medicines are outlined above.  Medication changes, Labs and Tests ordered today are listed  in the Patient Instructions below.  Patient Instructions  Medication Instructions:  You may take an extra 20 mg of Furosemide  a day for increased swelling If you take an extra dose of Furosemide , you will need to take Potassium 20 meq *If you need a refill on your cardiac medications before your next appointment, please call your pharmacy*  Lab Work: None ordered If you have labs (blood work) drawn today and your tests are completely normal, you will receive your results only by: MyChart Message (if you have MyChart) OR A paper copy in the mail If you have any lab test that is abnormal or we need to change your treatment, we will call you to review the results.  Testing/Procedures: CTA chest and CTA abdomen/pelvis ordered Non-Cardiac CT Angiography (CTA), is a special type of CT scan that uses a computer to produce multi-dimensional views of major blood vessels throughout the body. In CT angiography, a contrast material is injected through an IV to help visualize the blood vessels   Follow-Up: At [facility], you and your health needs are our priority.  As part of our continuing mission to provide you with exceptional heart care, our providers are all part of one team.  This team includes your primary Cardiologist (physician) and Advanced Practice Providers or APPs (Physician Assistants and Nurse Practitioners) who all work together to provide you with the care you need, when you need it.  Your next appointment:   1 year(s)  Provider:   Jerel Balding, MD    We recommend signing up for the patient portal called MyChart.  Sign up information is provided on this After Visit Summary.  MyChart is used to connect with patients for Virtual  Visits (Telemedicine).  Patients are able to view lab/test results, encounter notes, upcoming appointments, etc.  Non-urgent messages can be sent to your provider as well.   To learn more about what you can do with MyChart, go to [email or web address].      Signed, Jerel Balding, MD  08/20/2024 12:59 PM    [facility] [address] [place], [place], [place]  [address] Phone: [phone]; Fax: [phone]

## 2024-08-20 ENCOUNTER — Encounter: Payer: Self-pay | Admitting: Nurse Practitioner

## 2024-08-20 ENCOUNTER — Encounter: Payer: Self-pay | Admitting: Hematology

## 2024-08-20 ENCOUNTER — Encounter: Payer: Self-pay | Admitting: Cardiovascular Disease

## 2024-08-21 ENCOUNTER — Ambulatory Visit: Admitting: Family Medicine

## 2024-08-21 ENCOUNTER — Encounter: Payer: Self-pay | Admitting: Family Medicine

## 2024-08-21 ENCOUNTER — Telehealth: Payer: Self-pay

## 2024-08-21 VITALS — BP 150/90 | HR 78 | Temp 98.0°F | Resp 16 | Ht 68.0 in | Wt 221.0 lb

## 2024-08-21 DIAGNOSIS — R0602 Shortness of breath: Secondary | ICD-10-CM

## 2024-08-21 DIAGNOSIS — I10 Essential (primary) hypertension: Secondary | ICD-10-CM

## 2024-08-21 DIAGNOSIS — F419 Anxiety disorder, unspecified: Secondary | ICD-10-CM

## 2024-08-21 MED ORDER — BUSPIRONE HCL 5 MG PO TABS: 5.0000 mg | ORAL_TABLET | Freq: Two times a day (BID) | ORAL | 2 refills | Status: DC

## 2024-08-21 NOTE — Patient Instructions (Addendum)
 A few things to remember from today's visit:  Shortness of breath  Anxiety disorder, unspecified type - Plan: busPIRone  (BUSPAR ) 5 MG tablet  Essential hypertension, benign  Monitor blood pressure at home. Buspar  5 mg 2 times daily added today. No changes in Celexa .  If you need refills for medications you take chronically, please call your pharmacy. Do not use My Chart to request refills or for acute issues that need immediate attention. If you send a my chart message, it may take a few days to be addressed, specially if I am not in the office.  Please be sure medication list is accurate. If a new problem present, please set up appointment sooner than planned today.

## 2024-08-21 NOTE — Assessment & Plan Note (Signed)
 BP mildly elevated today. He is not monitoring BP at home, recommend doing so. Currently on Metoprolol  Tartrate 25 mg bid and low salt diet.

## 2024-08-21 NOTE — Assessment & Plan Note (Signed)
 He does not feel like his anxiety is getting worse but his family does. Cognitively he does not have a problem at this time. He agrees with adjusting treatment, recommend adding BuSpar  5 mg twice daily. Continue Celexa  10 mg daily. Follow-up in 6 to 8 weeks, before if needed.

## 2024-08-21 NOTE — Progress Notes (Signed)
 ACUTE VISIT Chief Complaint  Patient presents with   Anxiety    Patient states he is fidgeting a lot. States anxiousness.    Discussed the use of AI scribe software for clinical note transcription with the patient, who gave verbal consent to proceed. History of Present Illness Tony [name] is an 83 year old male with PMHx significant for COPD, chronic cough, hypertension, AAA, SSS status post pacemaker placement, anxiety, and prostate cancer  who presents with wife and daughter with concerns about worsening anxiety symptoms.  He is described by family as fidgety and unable to relax, and his family believes this may be related to anxiety about his upcoming surgery for abdominal aneurysms.  He does not think his anxiety is getting worse. Received a message from wife on 08/18/24 to recommend not driving unless it is an emergency. He has not driven for about 10 years. He tells me he is not interested in driving, he has his son in law who has offered to drive him if needed. AAA and thoracic aortic aneurism, both stable but concerns about future complications, so elective procedure of abdominal aortic aneurysm was discussed, endovascular aneurysm repair. He has seen his cardiologist on 08/18/24.  Anxiety: He is currently taking Celexa  10 mg daily for anxiety and has previously weaned off Xanax .  He sleeps well at night, going to bed around 8:00 PM and waking up at 4:00 AM, without taking naps during the day.  His family thinks he has a good memory, there are no concerns about cognitive decline.  His wife mentions shortness of breath, which worsens with exertion, such as walking to the mailbox. This has been a persistent issue for years, stable. He has not noticed any recent changes.  OSA: He uses a CPAP machine at night, which helps with breathing.  BP elevated today.SABRA He does not monitor his blood pressure at home.  He reports LE edema getting worse, his diuretic was recently increased  by his cardiologist. Negative for orthopnea or PND. Denies severe/frequent headache, visual changes, chest pain, worsening dyspnea, palpitation,or focal weakness. He is on Metoprolol  Tartrate 25 mg bid. Lab Results  Component Value Date   NA 137 08/16/2024   CL 103 08/16/2024   K 4.8 08/16/2024   CO2 21 (L) 08/16/2024   BUN 16 08/16/2024   CREATININE 0.89 08/16/2024   GFRNONAA >60 08/16/2024   CALCIUM  9.3 08/16/2024   PHOS 4.1 12/31/2021   ALBUMIN 4.5 08/16/2024   GLUCOSE 119 (H) 08/16/2024    COPD: Follows with pulmonologist. Chronic cough on Benzonatate  100 mg daily and Gabapentin  300 mg at bedtime. He feels like medications are helping with cough. Negative for wheezing.  Review of Systems  Constitutional:  Negative for activity change, appetite change, chills and fever.  HENT:  Negative for sore throat.   Cardiovascular:  Negative for chest pain and palpitations.  Gastrointestinal:  Negative for abdominal pain, nausea and vomiting.  Genitourinary:  Negative for decreased urine volume, dysuria and hematuria.  Skin:  Negative for rash.  Neurological:  Negative for syncope and facial asymmetry.  Psychiatric/Behavioral:  Negative for confusion and hallucinations.   See other pertinent positives and negatives in HPI.  Current Outpatient Medications on File Prior to Visit  Medication Sig Dispense Refill   acetaminophen  (TYLENOL ) 500 MG tablet Take 2 tablets (1,000 mg total) by mouth 3 (three) times daily. 30 tablet 0   albuterol  (VENTOLIN  HFA) 108 (90 Base) MCG/ACT inhaler TAKE 2 PUFFS BY MOUTH EVERY 6  HOURS AS NEEDED FOR WHEEZE OR SHORTNESS OF BREATH 8.5 each 12   aspirin  EC 81 MG tablet Take 81 mg by mouth daily.     atorvastatin  (LIPITOR) 20 MG tablet TAKE 1 TABLET BY MOUTH ONCE  DAILY 100 tablet 2   benzonatate  (TESSALON ) 100 MG capsule TAKE 1 CAPSULE BY MOUTH EVERY DAY AS NEEDED 30 capsule 1   budesonide -formoterol  (SYMBICORT ) 160-4.5 MCG/ACT inhaler 2 puffs then rinse  mouth, twice daily 10.2 g 12   citalopram  (CELEXA ) 10 MG tablet TAKE 1 TABLET BY MOUTH DAILY 100 tablet 2   clotrimazole -betamethasone  (LOTRISONE ) cream Apply 1 Application topically 2 (two) times daily as needed. 30 g 2   diclofenac  Sodium (VOLTAREN ) 1 % GEL AAPLY 4 GRAMS TOPICALLY 4 TIMES DAILY (Patient taking differently: Apply 1 Application topically 4 (four) times daily as needed (pain).) 400 g 2   enzalutamide  (XTANDI ) 40 MG tablet Take 4 tablets (160 mg total) by mouth daily. 120 tablet 2   fluticasone  (FLONASE ) 50 MCG/ACT nasal spray PLACE 1 SPRAY INTO BOTH NOSTRILS DAILY AS NEEDED FOR ALLERGIES OR RHINITIS (AT BEDTIME). 48 mL 1   furosemide  (LASIX ) 20 MG tablet Take 20 mg daily. You may take an extra 20 mg daily as needed for swelling. When you take an extra dose of Furosemide , you will need to take potassium 20 meq. 160 tablet 3   gabapentin  (NEURONTIN ) 300 MG capsule TAKE 1 CAPSULE BY MOUTH EVERYDAY AT BEDTIME 30 capsule 3   metoprolol  tartrate (LOPRESSOR ) 25 MG tablet TAKE 1 TABLET BY MOUTH TWICE  DAILY 180 tablet 0   Multiple Vitamin (MULTIVITAMIN WITH MINERALS) TABS tablet Take 0.5 tablets by mouth daily.     nystatin  (MYCOSTATIN /NYSTOP ) powder Apply 1 Application topically 2 (two) times daily. 60 g 3   omeprazole  (PRILOSEC) 20 MG capsule TAKE 1 CAPSULE BY MOUTH DAILY  BEFORE BREAKFAST 100 capsule 2   potassium chloride  SA (KLOR-CON  M) 20 MEQ tablet Take 1 tablet (20 mEq total) by mouth daily as needed (take only on the days that you take an extra dose of Furosemide ). 90 tablet 3   Spacer/Aero-Holding Chambers DEVI To use with inhalers 1 Device 0   Melatonin 10 MG CAPS Take 20 mg by mouth at bedtime. (Patient not taking: Reported on 08/21/2024)     tiZANidine  (ZANAFLEX ) 4 MG tablet Take 0.5-1 tablets (2-4 mg total) by mouth at bedtime. (Patient not taking: Reported on 08/21/2024) 30 tablet 0   No current facility-administered medications on file prior to visit.    Past Medical  History:  Diagnosis Date   Anxiety    Arthritis    COPD (chronic obstructive pulmonary disease) (HCC)    Coronary artery disease    Dyspnea    Gastric outlet obstruction 02/03/2018   thelbert 02/03/2018   GERD (gastroesophageal reflux disease)    History of hiatal hernia    small/notes 02/03/2018   Hypertension    no meds   Presence of permanent cardiac pacemaker    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   Gleason 4+5=9, PSA 15.65   Radiation    Sinus problem    SSS (sick sinus syndrome) (HCC)    Allergies  Allergen Reactions   Penicillins Rash    Has patient had a PCN reaction causing immediate rash, facial/tongue/throat swelling, SOB or lightheadedness with hypotension: Yes Has patient had a PCN reaction causing severe rash involving mucus membranes or skin necrosis: No Has patient had a PCN reaction that required hospitalization: No Has patient had  a PCN reaction occurring within the last 10 years: No If all of the above answers are NO, then may proceed with Cephalosporin use.    Social History   Socioeconomic History   Marital status: Married    Spouse name: Not on file   Number of children: 3   Years of education: Not on file   Highest education level: Not on file  Occupational History   Occupation: Estate agent    Employer: [employer]    Comment: retired   Occupation: production designer, theatre/television/film    Comment: [place]ired  Tobacco Use   Smoking status: Former    Types: Cigars    Quit date: 09/14/2012    Years since quitting: 11.9   Smokeless tobacco: Never   Tobacco comments:    little cigars; the small ones  Vaping Use   Vaping status: Never Used  Substance and Sexual Activity   Alcohol  use: Not Currently    Alcohol /week: 14.0 standard drinks of alcohol     Types: 14 Cans of beer per week    Comment: quit in 2000   Drug use: No   Sexual activity: Not Currently  Other Topics Concern   Not on file  Social History Narrative   Lives with wife and 2 dogs in one level home;  daughter and her family co-habitate.   Has three daughters, all in [place], supportive. Five grandchildren.   Wants to return to doing silver sneakers once gym re-opens.         Social Drivers of Corporate Investment Banker Strain: Low Risk  (07/29/2022)   Overall Financial Resource Strain (CARDIA)    Difficulty of Paying Living Expenses: Not hard at all  Recent Concern: Financial Resource Strain - Medium Risk (05/27/2022)   Overall Financial Resource Strain (CARDIA)    Difficulty of Paying Living Expenses: Somewhat hard  Food Insecurity: No Food Insecurity (10/15/2022)   Hunger Vital Sign    Worried About Running Out of Food in the Last Year: Never true    Ran Out of Food in the Last Year: Never true  Transportation Needs: No Transportation Needs (10/15/2022)   PRAPARE - Administrator, Civil Service (Medical): No    Lack of Transportation (Non-Medical): No  Physical Activity: Insufficiently Active (07/29/2022)   Exercise Vital Sign    Days of Exercise per Week: 5 days    Minutes of Exercise per Session: 20 min  Stress: No Stress Concern Present (07/29/2022)   [employer] of Occupational Health - Occupational Stress Questionnaire    Feeling of Stress : Not at all  Social Connections: Socially Integrated (07/29/2022)   Social Connection and Isolation Panel    Frequency of Communication with Friends and Family: More than three times a week    Frequency of Social Gatherings with Friends and Family: More than three times a week    Attends Religious Services: More than 4 times per year    Active Member of Clubs or Organizations: Yes    Attends Banker Meetings: More than 4 times per year    Marital Status: Married    Vitals:   08/21/24 0755 08/21/24 0822  BP: (!) 150/94 (!) 150/90  Pulse: 78   Resp: 16   Temp: 98 F (36.7 C)   SpO2: 92%    Body mass index is 33.6 kg/m.  Physical Exam Nursing note reviewed.  Constitutional:      General: He is not  in acute distress.    Appearance:  He is well-developed.  HENT:     Head: Normocephalic and atraumatic.  Eyes:     Conjunctiva/sclera: Conjunctivae normal.  Cardiovascular:     Rate and Rhythm: Normal rate and regular rhythm.     Heart sounds: No murmur heard. Pulmonary:     Effort: Pulmonary effort is normal. No respiratory distress.     Breath sounds: Normal breath sounds.  Musculoskeletal:     Right lower leg: 1+ Pitting Edema present.     Left lower leg: 1+ Pitting Edema present.  Skin:    General: Skin is warm.     Findings: No erythema or rash.  Neurological:     General: No focal deficit present.     Mental Status: He is alert and oriented to person, place, and time.     Comments: Unstable gait assisted with a cane.  Psychiatric:        Mood and Affect: Mood and affect normal.    ASSESSMENT AND PLAN:  Mr. Pranish Akhavan was seen today for anxiety.  Diagnoses and all orders for this visit:  Shortness of breath Assessment & Plan: This is a chronic problem, stable. Some of his chronic comorbidities can be contributing factors. He follows with cardiologist and pulmonologist. Instructed about warning signs. Next appt with cardiologist 09/12/24.  Anxiety disorder, unspecified type Assessment & Plan: He does not feel like his anxiety is getting worse but his family does. Cognitively he does not have a problem at this time. He agrees with adjusting treatment, recommend adding BuSpar  5 mg twice daily. Continue Celexa  10 mg daily. Follow-up in 6 to 8 weeks, before if needed.  Orders: -     busPIRone  HCl; Take 1 tablet (5 mg total) by mouth 2 (two) times daily.  Dispense: 60 tablet; Refill: 2  Essential hypertension, benign Assessment & Plan: BP mildly elevated today. He is not monitoring BP at home, recommend doing so. Currently on Metoprolol  Tartrate 25 mg bid and low salt diet.   Return in about 6 weeks (around 10/03/2024).  Amal Renbarger G. Fanny Agan, MD  [facility]. [place] office.

## 2024-08-21 NOTE — Assessment & Plan Note (Signed)
 This is a chronic problem, stable. Some of his chronic comorbidities can be contributing factors. He follows with cardiologist and pulmonologist. Instructed about warning signs.

## 2024-08-21 NOTE — Telephone Encounter (Signed)
 [facility] sent a fax requesting a refill for Tizanidine  but it stats patient stated he is no longer taking this medication as of 08/21/24. Okay to refill ?

## 2024-08-21 NOTE — Telephone Encounter (Signed)
 No, we wait until next follow up visit. Thanks, BJ

## 2024-08-26 ENCOUNTER — Encounter: Payer: Self-pay | Admitting: Cardiovascular Disease

## 2024-08-29 ENCOUNTER — Encounter: Payer: Self-pay | Admitting: Cardiovascular Disease

## 2024-08-31 ENCOUNTER — Other Ambulatory Visit: Payer: Self-pay | Admitting: Hematology

## 2024-08-31 ENCOUNTER — Other Ambulatory Visit: Payer: Self-pay

## 2024-08-31 DIAGNOSIS — C61 Malignant neoplasm of prostate: Secondary | ICD-10-CM

## 2024-08-31 MED ORDER — ENZALUTAMIDE 40 MG PO TABS
160.0000 mg | ORAL_TABLET | Freq: Every day | ORAL | 2 refills | Status: AC
  Filled 2024-08-31 – 2024-09-01 (×2): qty 120, 30d supply, fill #0
  Filled 2024-09-29: qty 120, 30d supply, fill #1

## 2024-09-01 ENCOUNTER — Other Ambulatory Visit: Payer: Self-pay

## 2024-09-05 ENCOUNTER — Other Ambulatory Visit: Payer: Self-pay

## 2024-09-05 NOTE — Progress Notes (Signed)
 Specialty Pharmacy Refill Coordination Note  Tony [name] is a 83 y.o. male contacted today regarding refills of specialty medication(s) Enzalutamide  (XTANDI )   Patient requested Delivery   Delivery date: 09/08/24   Verified address: [address] [place] [address]   Medication will be filled on: 09/06/24

## 2024-09-06 ENCOUNTER — Other Ambulatory Visit: Payer: Self-pay

## 2024-09-09 ENCOUNTER — Other Ambulatory Visit: Payer: Self-pay | Admitting: Family Medicine

## 2024-09-09 DIAGNOSIS — M545 Low back pain, unspecified: Secondary | ICD-10-CM

## 2024-09-12 ENCOUNTER — Other Ambulatory Visit: Payer: Self-pay

## 2024-09-12 ENCOUNTER — Ambulatory Visit: Payer: Medicare Other

## 2024-09-12 DIAGNOSIS — I495 Sick sinus syndrome: Secondary | ICD-10-CM | POA: Diagnosis not present

## 2024-09-12 DIAGNOSIS — F419 Anxiety disorder, unspecified: Secondary | ICD-10-CM

## 2024-09-12 MED ORDER — BUSPIRONE HCL 5 MG PO TABS: 5.0000 mg | ORAL_TABLET | Freq: Two times a day (BID) | ORAL | 2 refills | Status: AC

## 2024-09-13 LAB — CUP PACEART REMOTE DEVICE CHECK
Battery Remaining Longevity: 93 mo
Battery Voltage: 2.98 V
Brady Statistic AP VP Percent: 0.17 %
Brady Statistic AP VS Percent: 80.92 %
Brady Statistic AS VP Percent: 0.05 %
Brady Statistic AS VS Percent: 18.86 %
Brady Statistic RA Percent Paced: 81.07 %
Brady Statistic RV Percent Paced: 0.22 %
Date Time Interrogation Session: 20251229211203
Implantable Lead Connection Status: 753985
Implantable Lead Connection Status: 753985
Implantable Lead Implant Date: 20191001
Implantable Lead Implant Date: 20191001
Implantable Lead Location: 753859
Implantable Lead Location: 753860
Implantable Lead Model: 5076
Implantable Lead Model: 5076
Implantable Pulse Generator Implant Date: 20191001
Lead Channel Impedance Value: 304 Ohm
Lead Channel Impedance Value: 323 Ohm
Lead Channel Impedance Value: 399 Ohm
Lead Channel Impedance Value: 532 Ohm
Lead Channel Pacing Threshold Amplitude: 0.375 V
Lead Channel Pacing Threshold Amplitude: 1 V
Lead Channel Pacing Threshold Pulse Width: 0.4 ms
Lead Channel Pacing Threshold Pulse Width: 0.4 ms
Lead Channel Sensing Intrinsic Amplitude: 2.75 mV
Lead Channel Sensing Intrinsic Amplitude: 2.75 mV
Lead Channel Sensing Intrinsic Amplitude: 3.375 mV
Lead Channel Sensing Intrinsic Amplitude: 3.375 mV
Lead Channel Setting Pacing Amplitude: 1.5 V
Lead Channel Setting Pacing Amplitude: 2 V
Lead Channel Setting Pacing Pulse Width: 0.4 ms
Lead Channel Setting Sensing Sensitivity: 0.9 mV
Zone Setting Status: 755011
Zone Setting Status: 755011

## 2024-09-15 ENCOUNTER — Other Ambulatory Visit: Payer: Self-pay

## 2024-09-15 DIAGNOSIS — F419 Anxiety disorder, unspecified: Secondary | ICD-10-CM

## 2024-09-16 ENCOUNTER — Ambulatory Visit: Payer: Self-pay | Admitting: Cardiovascular Disease

## 2024-09-20 NOTE — Progress Notes (Signed)
 Remote PPM Transmission

## 2024-09-27 ENCOUNTER — Telehealth: Payer: Self-pay

## 2024-09-27 ENCOUNTER — Other Ambulatory Visit (HOSPITAL_COMMUNITY): Payer: Self-pay

## 2024-09-27 ENCOUNTER — Encounter: Payer: Self-pay | Admitting: Hematology

## 2024-09-27 NOTE — Telephone Encounter (Signed)
 Oral Oncology Patient Advocate Encounter  Was successful in securing patient a $6000 grant from [facility] to provide copayment coverage for Xtandi .  This will keep the out of pocket expense at $0.     [employer] ID: [id number]   The billing information is as follows and has been shared with [facility].    RxBin: [id number] PCN: [id number] Member ID: [id number] Group ID: [id number] Dates of Eligibility: 08/20/24 through 08/19/25  Fund:  Prostate   Lucie Lamer, CPhT [place]  [facility] Oncology Pharmacy Patient Advocate Specialist [address] Phone: [phone]  Fax: [phone] [email or web address]

## 2024-09-29 ENCOUNTER — Other Ambulatory Visit: Payer: Self-pay

## 2024-10-02 ENCOUNTER — Other Ambulatory Visit: Payer: Self-pay | Admitting: Pharmacy Technician

## 2024-10-02 ENCOUNTER — Other Ambulatory Visit: Payer: Self-pay

## 2024-10-02 NOTE — Progress Notes (Signed)
 Specialty Pharmacy Refill Coordination Note  Tony [name] is a 84 y.o. male contacted today regarding refills of specialty medication(s) Enzalutamide  (XTANDI )   Patient requested Delivery   Delivery date: 10/05/24   Verified address: [address] [place] [address]   Medication will be filled on: 10/04/24    10/02/24 Spoke with Donia Bar- patients spouse

## 2024-10-04 ENCOUNTER — Other Ambulatory Visit: Payer: Self-pay

## 2024-10-05 ENCOUNTER — Other Ambulatory Visit: Payer: Self-pay | Admitting: Family Medicine

## 2024-10-05 DIAGNOSIS — R053 Chronic cough: Secondary | ICD-10-CM

## 2024-10-11 ENCOUNTER — Other Ambulatory Visit: Payer: Self-pay | Admitting: Family Medicine

## 2024-10-11 DIAGNOSIS — J988 Other specified respiratory disorders: Secondary | ICD-10-CM

## 2024-10-19 ENCOUNTER — Ambulatory Visit (HOSPITAL_COMMUNITY)
Admission: RE | Admit: 2024-10-19 | Discharge: 2024-10-19 | Attending: Cardiovascular Disease | Admitting: Cardiovascular Disease

## 2024-10-19 DIAGNOSIS — I7121 Aneurysm of the ascending aorta, without rupture: Secondary | ICD-10-CM

## 2024-10-19 DIAGNOSIS — I714 Abdominal aortic aneurysm, without rupture, unspecified: Secondary | ICD-10-CM

## 2024-10-19 MED ORDER — IOHEXOL 350 MG/ML SOLN
100.0000 mL | Freq: Once | INTRAVENOUS | Status: AC | PRN
  Administered 2024-10-19: 100 mL via INTRAVENOUS

## 2024-11-14 ENCOUNTER — Inpatient Hospital Stay

## 2024-11-14 ENCOUNTER — Inpatient Hospital Stay: Admitting: Hematology

## 2024-12-12 ENCOUNTER — Ambulatory Visit

## 2025-01-22 ENCOUNTER — Ambulatory Visit: Admitting: Family Medicine

## 2025-03-13 ENCOUNTER — Ambulatory Visit

## 2025-06-12 ENCOUNTER — Ambulatory Visit

## 2025-09-11 ENCOUNTER — Ambulatory Visit

## 2025-12-11 ENCOUNTER — Ambulatory Visit
